# Patient Record
Sex: Male | Born: 1945 | Race: White | Hispanic: No | Marital: Married
Health system: Midwestern US, Community
[De-identification: ages and names within clinical notes are randomized; demographics above are authoritative.]

## PROBLEM LIST (undated history)

## (undated) DIAGNOSIS — K750 Abscess of liver: Secondary | ICD-10-CM

## (undated) DIAGNOSIS — R011 Cardiac murmur, unspecified: Secondary | ICD-10-CM

## (undated) DIAGNOSIS — N200 Calculus of kidney: Secondary | ICD-10-CM

## (undated) DIAGNOSIS — I10 Essential (primary) hypertension: Secondary | ICD-10-CM

## (undated) DIAGNOSIS — E785 Hyperlipidemia, unspecified: Secondary | ICD-10-CM

## (undated) HISTORY — DX: Essential (primary) hypertension: I10

## (undated) HISTORY — PX: TONSILLECTOMY: SUR1361

---

## 2005-11-05 ENCOUNTER — Emergency Department (HOSPITAL_COMMUNITY): Admission: EM | Admit: 2005-11-05 | Discharge: 2005-11-05 | Payer: Self-pay | Admitting: Emergency Medicine

## 2005-11-09 ENCOUNTER — Inpatient Hospital Stay (HOSPITAL_COMMUNITY): Admission: EM | Admit: 2005-11-09 | Discharge: 2005-11-15 | Payer: Self-pay | Admitting: Emergency Medicine

## 2005-11-23 ENCOUNTER — Encounter: Admission: RE | Admit: 2005-11-23 | Discharge: 2006-02-21 | Payer: Self-pay | Admitting: General Surgery

## 2007-03-16 IMAGING — CT CT PELVIS W/ CM
2 of 6 series · 17 of 46 positions shown, 19 images · IV contrast (omnipaque)
Comparison: None

ABDOMEN CT WITH CONTRAST

CLINICAL DATA: Abdominal pain, left groin pain, possible incarcerated hernia
TECHNIQUE: Multidetector CT imaging of the abdomen and pelvis was performed
following the standard protocol during bolus administration of intravenous
contrast.

Contrast:  125 cc Omnipaque 300

[Series 2: abd_pel 5.0 b40f st · axial · 0.82mm/px · z∈[-492,-67]mm · 14 of 99 slices shown, 16 images]
[im 7/99  soft-tissue]
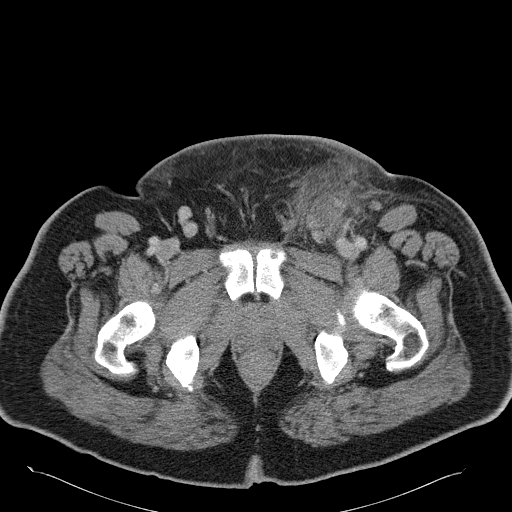
[im 7/99  bone]
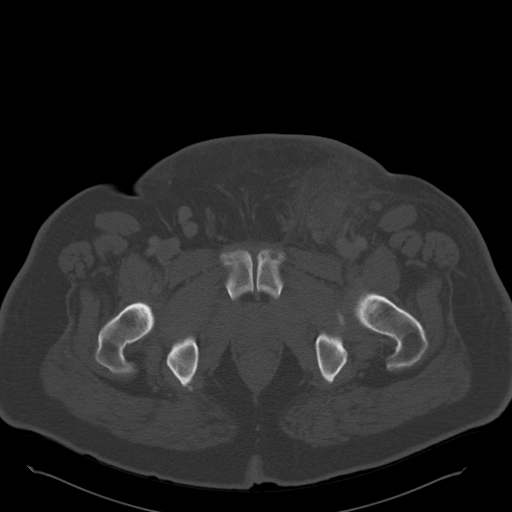
[im 14/99  soft-tissue]
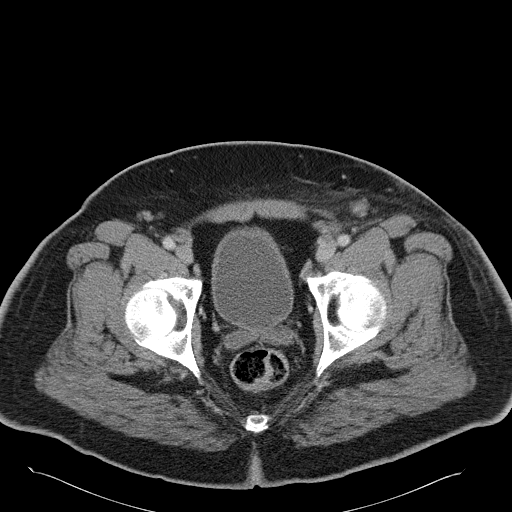
[im 20/99  soft-tissue]
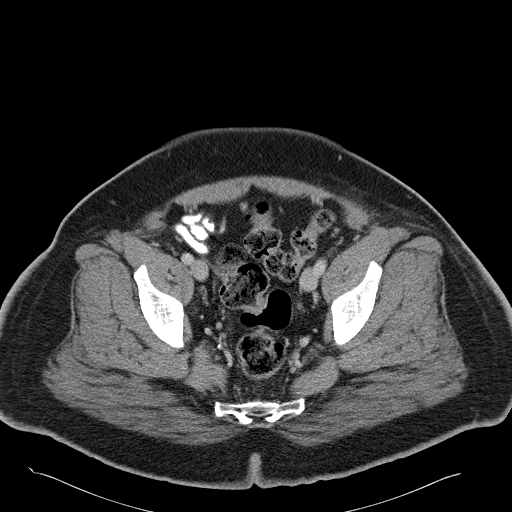
[im 27/99  soft-tissue]
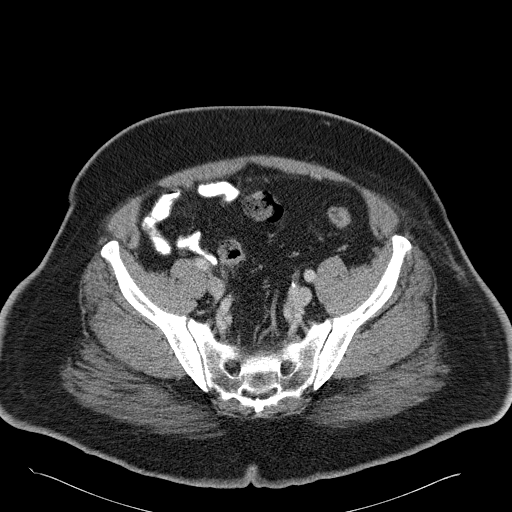
[im 33/99  soft-tissue]
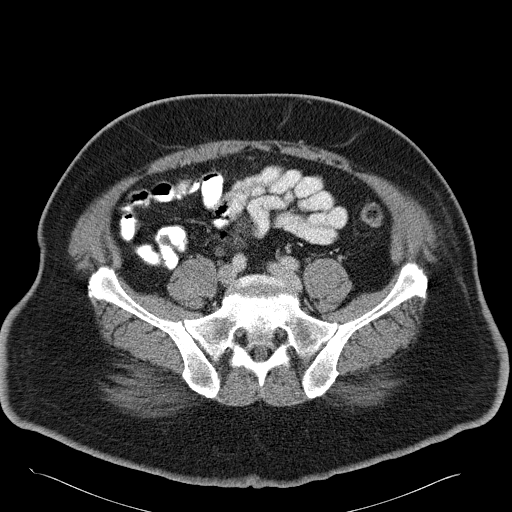
[im 40/99  soft-tissue]
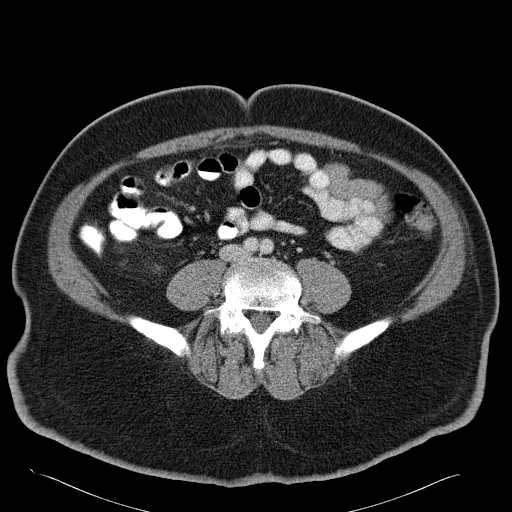
[im 46/99  soft-tissue]
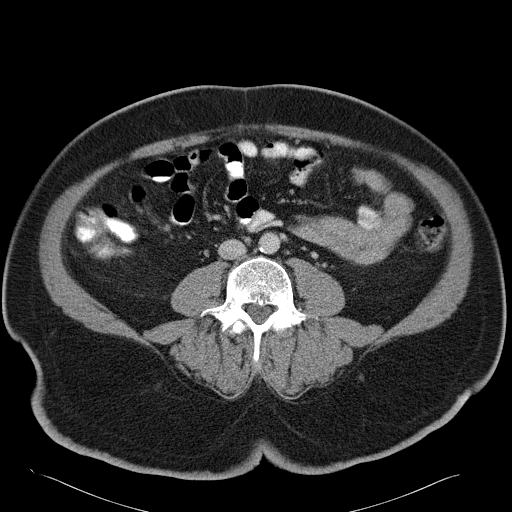
[im 53/99  soft-tissue]
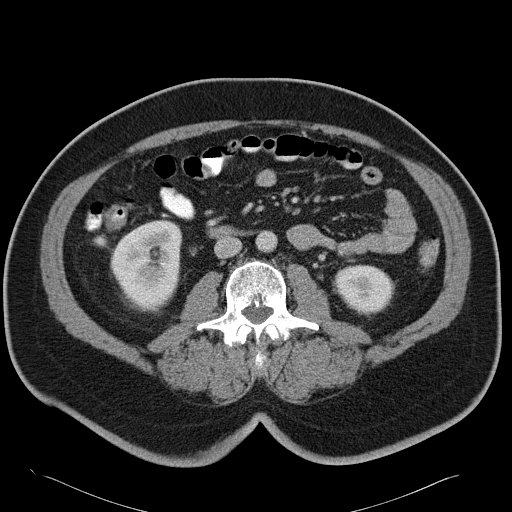
[im 59/99  soft-tissue]
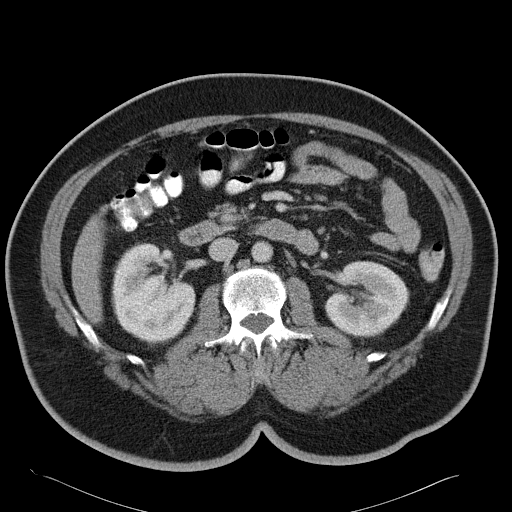
[im 59/99  bone]
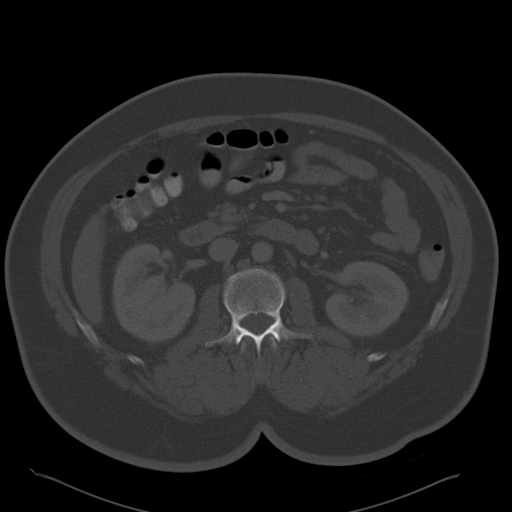
[im 66/99  soft-tissue]
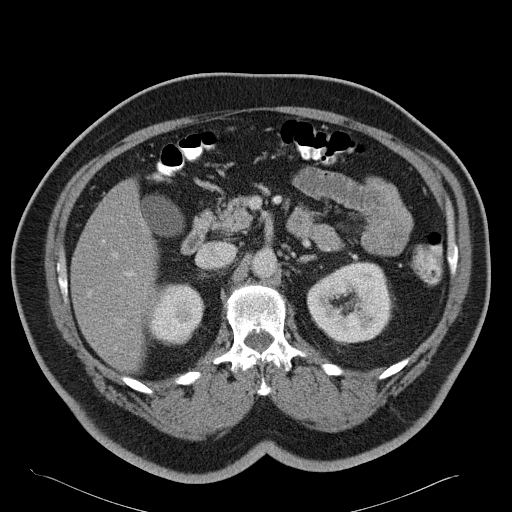
[im 72/99  soft-tissue]
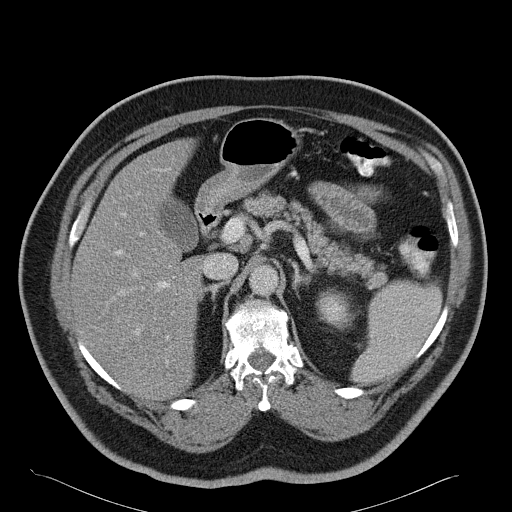
[im 79/99  soft-tissue]
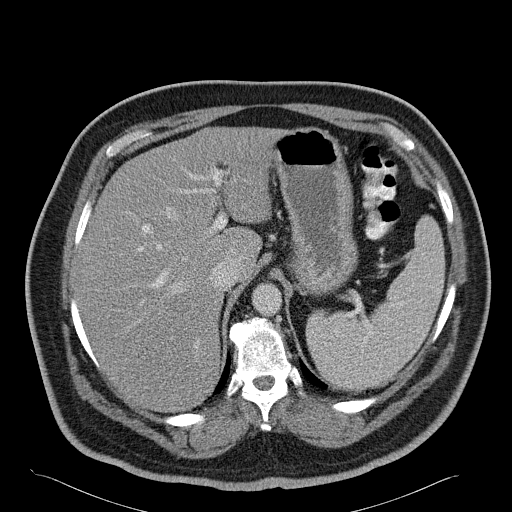
[im 85/99  soft-tissue]
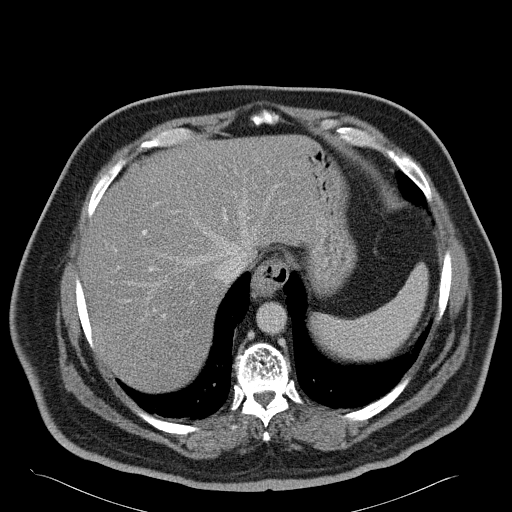
[im 92/99  soft-tissue]
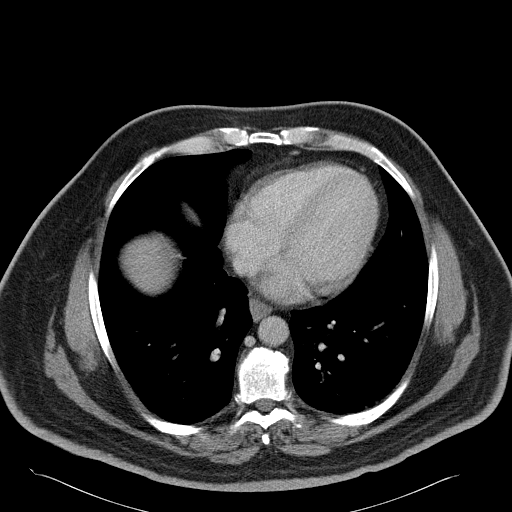

[Series 602: coronal · coronal · 1.00mm/px · 3 of 59 slices shown]
[im 20/59  soft-tissue]
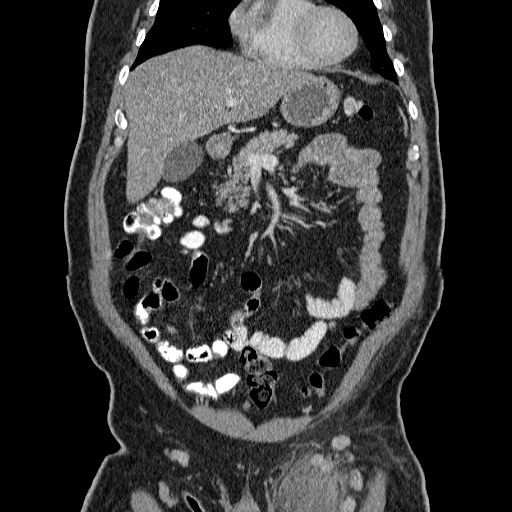
[im 26/59  soft-tissue]
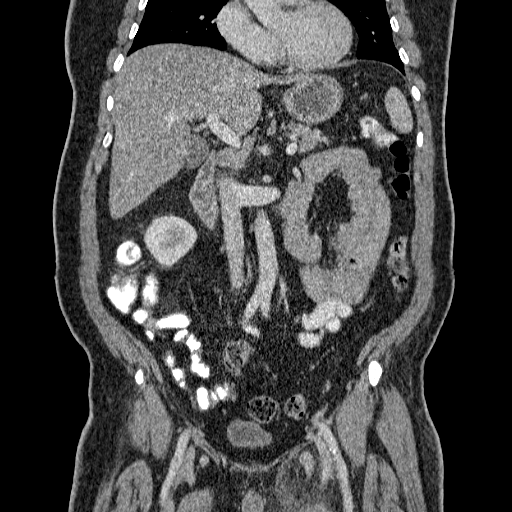
[im 33/59  soft-tissue]
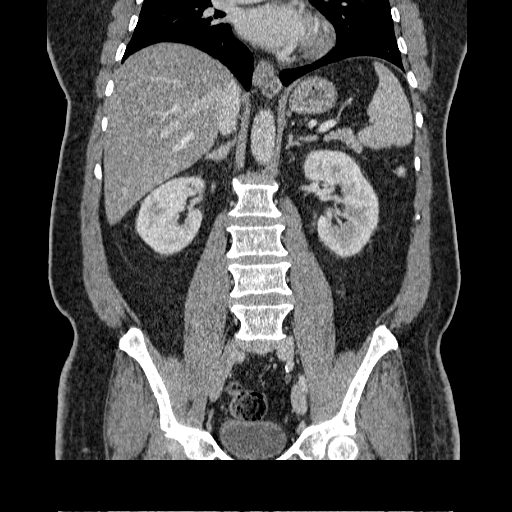

[17 of 46 positions shown; findings below may reference images not displayed]

FINDINGS: Fatty infiltration of the liver. No focal lesions in the liver,
spleen, pancreas, or adrenals, or right kidney. Simple appearing cyst in the
midpole of the left kidney. Gallbladder and bowel grossly unremarkable. No free
fluid, free air, or adenopathy. Degenerative changes in the thoracolumbar spine.
Minimal dependent and bibasilar atelectasis.

IMPRESSION

Fatty infiltration of the liver.

PELVIS CT WITH CONTRAST
FINDINGS: Inflammatory process in the left groin region. No focal fluid
collection to suggest abscess. Mildly enlarged bilateral inguinal nodes, left
greater than right.

No free fluid or free air in the abdomen. Bowel grossly unremarkable. No
evidence of hernia.

IMPRESSION

Inflammatory process in the left groin, possibly representing cellulitis. This
does extend down to near the musculature. Mildly enlarged inguinal lymph nodes
bilaterally, left greater than right. No focal fluid collection to suggest
abscess at this time.

## 2011-05-12 ENCOUNTER — Emergency Department (HOSPITAL_COMMUNITY)
Admission: EM | Admit: 2011-05-12 | Discharge: 2011-05-13 | Disposition: A | Discharge status: Home / Self Care | Payer: Self-pay | Attending: Emergency Medicine | Admitting: Emergency Medicine

## 2011-05-12 ENCOUNTER — Emergency Department (HOSPITAL_COMMUNITY): Payer: Self-pay

## 2011-05-12 DIAGNOSIS — I498 Other specified cardiac arrhythmias: Secondary | ICD-10-CM | POA: Insufficient documentation

## 2011-05-12 DIAGNOSIS — R569 Unspecified convulsions: Secondary | ICD-10-CM | POA: Insufficient documentation

## 2011-05-12 DIAGNOSIS — E119 Type 2 diabetes mellitus without complications: Secondary | ICD-10-CM | POA: Insufficient documentation

## 2011-05-13 ENCOUNTER — Emergency Department (HOSPITAL_COMMUNITY): Payer: Self-pay

## 2011-05-13 ENCOUNTER — Encounter (HOSPITAL_COMMUNITY): Payer: Self-pay | Admitting: Radiology

## 2011-05-13 LAB — DIFFERENTIAL
Basophils Absolute: 0 10*3/uL (ref 0.0–0.1)
Basophils Relative: 0 % (ref 0–1)
Eosinophils Absolute: 0.1 10*3/uL (ref 0.0–0.7)
Eosinophils Relative: 1 % (ref 0–5)
Lymphocytes Relative: 18 % (ref 12–46)
Lymphs Abs: 2.1 10*3/uL (ref 0.7–4.0)
Monocytes Absolute: 1.3 10*3/uL — ABNORMAL HIGH (ref 0.1–1.0)
Monocytes Relative: 11 % (ref 3–12)

## 2011-05-13 LAB — URINALYSIS, ROUTINE W REFLEX MICROSCOPIC
Glucose, UA: >1000 mg/dL — AB
Leukocytes, UA: NEGATIVE
pH: 5 (ref 5.0–8.0)

## 2011-05-13 LAB — COMPREHENSIVE METABOLIC PANEL
AST: 28 U/L (ref 0–37)
Albumin: 3.6 g/dL (ref 3.5–5.2)
BUN: 21 mg/dL (ref 6–23)
Calcium: 9.3 mg/dL (ref 8.4–10.5)
Creatinine, Ser: 0.99 mg/dL (ref 0.50–1.35)
GFR calc Af Amer: >60 mL/min (ref >60)

## 2011-05-13 LAB — RAPID URINE DRUG SCREEN, HOSP PERFORMED
Barbiturates: NOT DETECTED
Benzodiazepines: NOT DETECTED
Cocaine: NOT DETECTED
Opiates: NOT DETECTED

## 2011-05-13 LAB — CBC
MCH: 29.4 pg (ref 26.0–34.0)
MCV: 82.7 fL (ref 78.0–100.0)
RDW: 12.4 % (ref 11.5–15.5)

## 2011-05-13 LAB — GLUCOSE, CAPILLARY
Glucose-Capillary: 403 mg/dL — ABNORMAL HIGH (ref 70–99)
Glucose-Capillary: 408 mg/dL — ABNORMAL HIGH (ref 70–99)

## 2011-05-13 LAB — URINE MICROSCOPIC-ADD ON

## 2011-05-13 LAB — CK TOTAL AND CKMB (NOT AT ARMC)
CK, MB: 3.2 ng/mL (ref 0.3–4.0)
Relative Index: 1.6 (ref 0.0–2.5)

## 2011-05-15 ENCOUNTER — Ambulatory Visit (HOSPITAL_COMMUNITY)
Admission: RE | Admit: 2011-05-15 | Discharge: 2011-05-15 | Disposition: A | Discharge status: Home / Self Care | Payer: Self-pay | Source: Ambulatory Visit | Attending: Emergency Medicine | Admitting: Emergency Medicine

## 2011-05-15 DIAGNOSIS — E119 Type 2 diabetes mellitus without complications: Secondary | ICD-10-CM | POA: Insufficient documentation

## 2011-05-15 DIAGNOSIS — Z1389 Encounter for screening for other disorder: Secondary | ICD-10-CM | POA: Insufficient documentation

## 2011-05-15 DIAGNOSIS — R569 Unspecified convulsions: Secondary | ICD-10-CM | POA: Insufficient documentation

## 2011-05-16 NOTE — Procedures (Unsigned)
Referred by Dr. Lynelle Doctor  HISTORY:  A 65 year old man with new-onset seizure while in sleep with hyperglycemia of blood glucose 483.  EEG for further evaluation.  MEDICATIONS:  Metformin.  EEG DURATION:  21.5 minutes of EEG recording time.  EEG DESCRIPTION:  This is a routine 18 channel adult EEG recording with one channel devoted to limited EKG recording.  Activation procedure is performed during the photic stimulation and hyperventilation.  The study is performed in awake and mild drowsy state.  As the EEG opens up, I noticed that the posterior dominant rhythm consist of 9 Hz frequency with an amplitude ranging up to 22 microvolts. The rhythm is symmetrical and continuous.  There is no electrographic seizures or epileptiform discharges recorded during the study.  Mild driving was noted with the photic stimulation in posterior lead and buildup of high amplitude slowing was recorded with the hyperventilation without any provocation for epileptiform discharges.  I do observe some vertex waves and synchronous spindles as the patient got drowsy. However, no deeper stages of sleep were identified.  EEG INTERPRETATION:  This is a normal awake and drowsy EEG.  There is no evidence to suggest electrographic seizures or epileptiform discharges based on the study.          ______________________________ Levie Heritage, MD    ZO:[id number] D:  05/15/2011 16:23:33  T:  05/16/2011 03:19:03  Job #:  [id number]

## 2012-09-20 IMAGING — CR DG CHEST 1V
1 series · 1 of 1 positions shown · non-contrast
Comparison: 11/10/2005

CLINICAL DATA: Seizure history.

CHEST - 1 VIEW

[x chest ap]
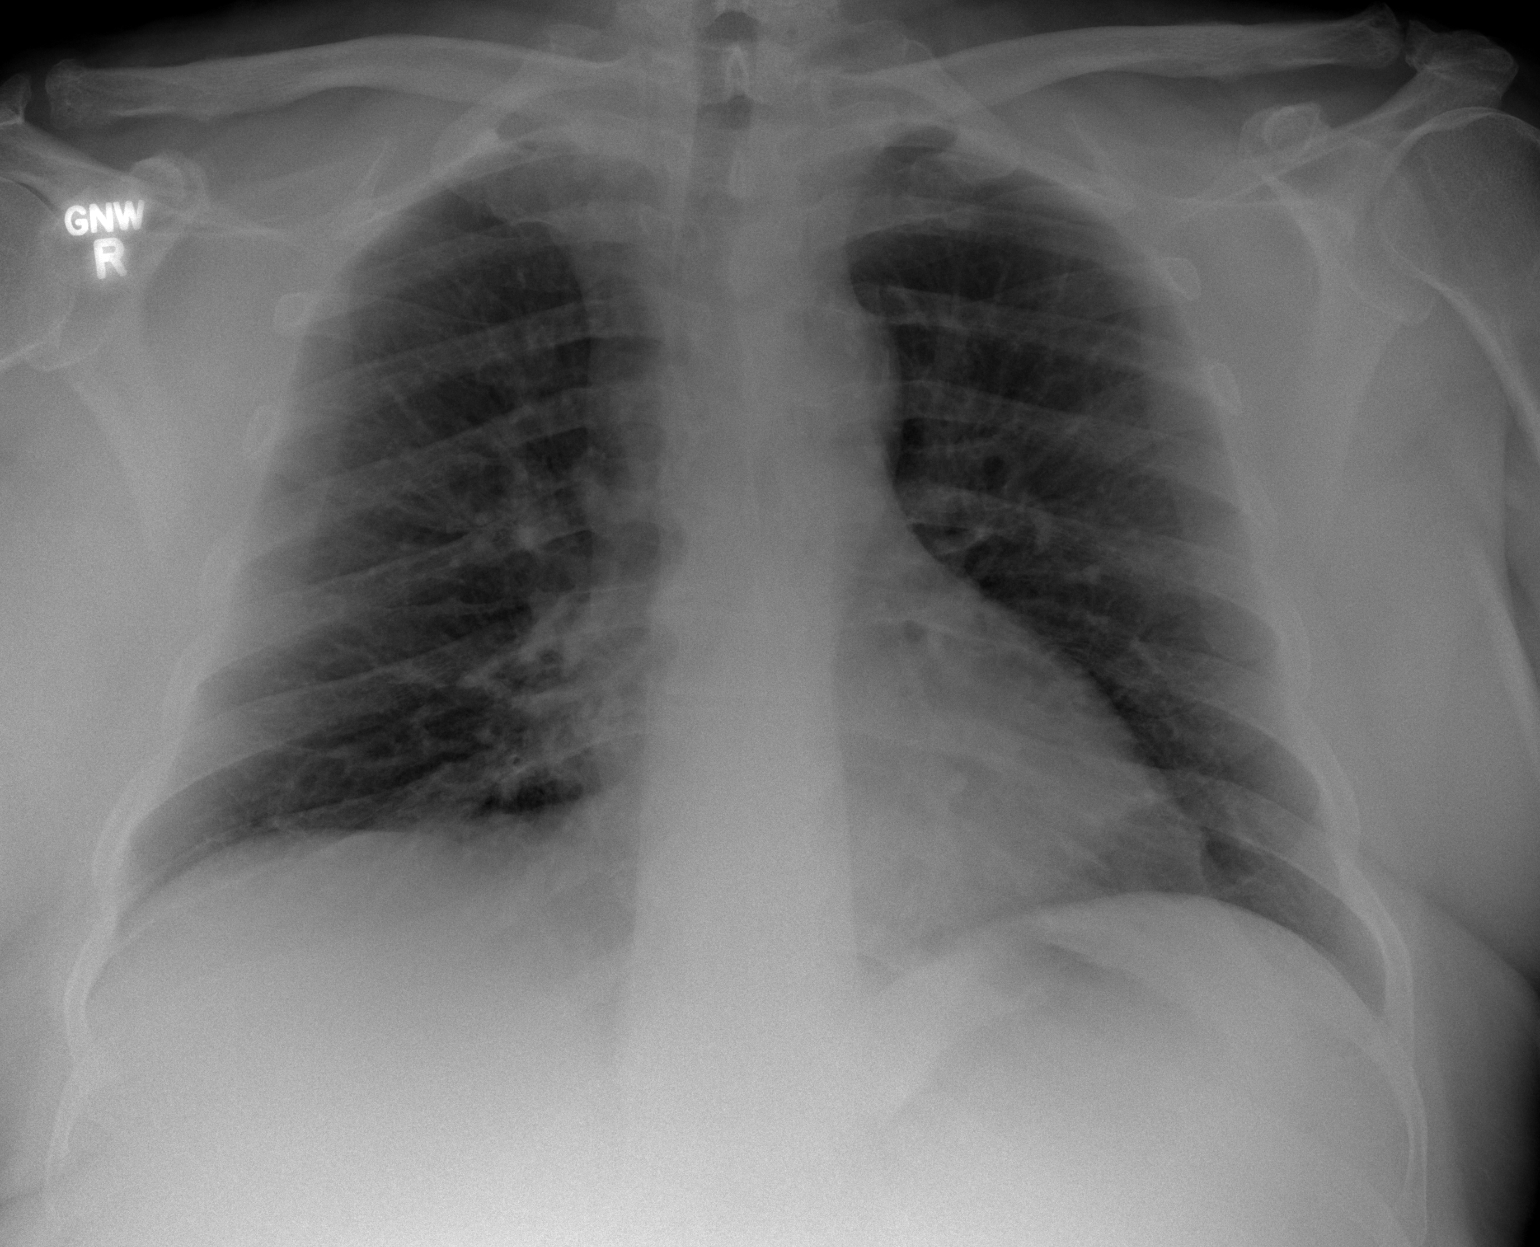

[1 of 1 positions shown; findings below may reference images not displayed]

FINDINGS: Apical lordotic projection.  Right basilar scarring and
bronchitic change.  No airspace consolidation.  No effusion.
Cardiopericardial silhouette and mediastinal contours appear within
normal limits allowing for projection.  Trachea midline.
IMPRESSION: Chronic right basilar scarring and bronchitic change.  No acute
cardiopulmonary disease.

## 2013-03-29 ENCOUNTER — Encounter: Payer: Self-pay | Admitting: Family Medicine

## 2013-03-29 ENCOUNTER — Ambulatory Visit (INDEPENDENT_AMBULATORY_CARE_PROVIDER_SITE_OTHER): Payer: Self-pay | Admitting: Family Medicine

## 2013-03-29 VITALS — BP 192/120 | HR 83 | Temp 97.5°F | Resp 16 | Ht 67.5 in | Wt 246.6 lb

## 2013-03-29 DIAGNOSIS — E1129 Type 2 diabetes mellitus with other diabetic kidney complication: Secondary | ICD-10-CM | POA: Insufficient documentation

## 2013-03-29 DIAGNOSIS — H5501 Congenital nystagmus: Secondary | ICD-10-CM | POA: Insufficient documentation

## 2013-03-29 DIAGNOSIS — E785 Hyperlipidemia, unspecified: Secondary | ICD-10-CM

## 2013-03-29 DIAGNOSIS — I10 Essential (primary) hypertension: Secondary | ICD-10-CM | POA: Insufficient documentation

## 2013-03-29 DIAGNOSIS — E7800 Pure hypercholesterolemia, unspecified: Secondary | ICD-10-CM | POA: Insufficient documentation

## 2013-03-29 DIAGNOSIS — E119 Type 2 diabetes mellitus without complications: Secondary | ICD-10-CM

## 2013-03-29 LAB — COMPREHENSIVE METABOLIC PANEL
ALT: 21 U/L (ref 0–53)
AST: 25 U/L (ref 0–37)
Albumin: 4.5 g/dL (ref 3.5–5.2)
Alkaline Phosphatase: 47 U/L (ref 39–117)
BUN: 22 mg/dL (ref 6–23)
CO2: 21 mEq/L (ref 19–32)
Calcium: 9.5 mg/dL (ref 8.4–10.5)
Chloride: 102 mEq/L (ref 96–112)
Creat: 1.18 mg/dL (ref 0.50–1.35)
Glucose, Bld: 299 mg/dL — ABNORMAL HIGH (ref 70–99)
Potassium: 4.2 mEq/L (ref 3.5–5.3)
Sodium: 138 mEq/L (ref 135–145)
Total Bilirubin: 1 mg/dL (ref 0.3–1.2)
Total Protein: 8 g/dL (ref 6.0–8.3)

## 2013-03-29 LAB — LIPID PANEL
Cholesterol: 206 mg/dL — ABNORMAL HIGH (ref 0–200)
HDL: 33 mg/dL — ABNORMAL LOW (ref >39)
Total CHOL/HDL Ratio: 6.2 Ratio
Triglycerides: 585 mg/dL — ABNORMAL HIGH (ref <150)

## 2013-03-29 LAB — CBC WITH DIFFERENTIAL/PLATELET
Basophils Absolute: 0 10*3/uL (ref 0.0–0.1)
Basophils Relative: 1 % (ref 0–1)
Eosinophils Absolute: 0.2 10*3/uL (ref 0.0–0.7)
Eosinophils Relative: 3 % (ref 0–5)
HCT: 41.6 % (ref 39.0–52.0)
Hemoglobin: 14.2 g/dL (ref 13.0–17.0)
Lymphocytes Relative: 38 % (ref 12–46)
Lymphs Abs: 2.2 10*3/uL (ref 0.7–4.0)
MCH: 28.9 pg (ref 26.0–34.0)
MCHC: 34.1 g/dL (ref 30.0–36.0)
MCV: 84.7 fL (ref 78.0–100.0)
Monocytes Absolute: 0.6 10*3/uL (ref 0.1–1.0)
Monocytes Relative: 10 % (ref 3–12)
Neutro Abs: 2.8 10*3/uL (ref 1.7–7.7)
Neutrophils Relative %: 48 % (ref 43–77)
Platelets: 116 10*3/uL — ABNORMAL LOW (ref 150–400)
RBC: 4.91 MIL/uL (ref 4.22–5.81)
RDW: 14 % (ref 11.5–15.5)
WBC: 5.8 10*3/uL (ref 4.0–10.5)

## 2013-03-29 LAB — PSA: PSA: 0.62 ng/mL (ref <=4.00)

## 2013-03-29 LAB — POCT GLYCOSYLATED HEMOGLOBIN (HGB A1C): Hemoglobin A1C: 13.4

## 2013-03-29 MED ORDER — HYDROCHLOROTHIAZIDE 25 MG PO TABS: 12.5000 mg | ORAL_TABLET | Freq: Every day | ORAL | Status: DC

## 2013-03-29 MED ORDER — SIMVASTATIN 40 MG PO TABS: 40.0000 mg | ORAL_TABLET | Freq: Every evening | ORAL | Status: DC

## 2013-03-29 MED ORDER — METOPROLOL SUCCINATE ER 100 MG PO TB24: 100.0000 mg | ORAL_TABLET | Freq: Every day | ORAL | Status: DC

## 2013-03-29 MED ORDER — GLYBURIDE 2.5 MG PO TABS: 2.5000 mg | ORAL_TABLET | Freq: Two times a day (BID) | ORAL | Status: DC

## 2013-03-29 MED ORDER — LOSARTAN POTASSIUM 100 MG PO TABS: 100.0000 mg | ORAL_TABLET | Freq: Every day | ORAL | Status: DC

## 2013-03-29 NOTE — Progress Notes (Signed)
67 yo veteran, married to [employer], who has been treated at the [place] until now.  He has had hypertension, cholesterol, and diabetes problems 6-7 years ago.  [facility] for eye checks  [facility]  Zostavax:  Never  Pneumonia shot: last October  Tetanus shot:  Last October  Colonoscopy:  Last year, polyps noted.  Recheck 3 years  Last PSA:  Last year  Checks sugar at home:  95-125 Checks blood pressure at home:  120/80 to 150/80  Off meds for two weeks. 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  Assessment:  Hypertension, diabetes, hyperlipidemia  Plan:   Hypertension - Plan: metoprolol succinate (TOPROL-XL) 100 MG 24 hr tablet, losartan (COZAAR) 100 MG tablet, hydrochlorothiazide (HYDRODIURIL) 25 MG tablet, CBC with Differential, Comprehensive metabolic panel, PSA  Hyperlipidemia - Plan: simvastatin (ZOCOR) 40 MG tablet, CBC with Differential, Lipid panel  Type 2 diabetes mellitus - Plan: glyBURIDE (DIABETA) 2.5 MG tablet, Comprehensive metabolic panel, POCT glycosylated hemoglobin (Hb A1C)  Inari Shin

## 2013-03-29 NOTE — Patient Instructions (Signed)
Recheck in 4 months

## 2013-04-04 ENCOUNTER — Telehealth: Payer: Self-pay

## 2013-04-04 DIAGNOSIS — E785 Hyperlipidemia, unspecified: Secondary | ICD-10-CM

## 2013-04-04 NOTE — Telephone Encounter (Signed)
Pt states that [facility] informed him that we need to call them regarding his prescriptions (pt does not know which ones). Pt also does not know a number we can reach them. Best# [phone]

## 2013-04-04 NOTE — Telephone Encounter (Signed)
Called him, what meds is he requesting?

## 2013-04-04 NOTE — Telephone Encounter (Signed)
Patient states he got message, one of the meds needs clarification, states we will get information from [facility].

## 2013-04-05 MED ORDER — SIMVASTATIN 40 MG PO TABS: 20.0000 mg | ORAL_TABLET | Freq: Every evening | ORAL | Status: DC

## 2013-04-05 NOTE — Addendum Note (Signed)
Addended by: Sheppard Plumber A on: 04/05/2013 01:07 PM   Modules accepted: Orders

## 2013-04-05 NOTE — Telephone Encounter (Signed)
Received fax for clarification of sig for simvastatin, 1/2 or 1 tab QD? Contacted pt to clarify what he has been taking since Dr L, did not intend to change the dosage. Pt clarified that he has been taking 1/2 tab QD. Sending in new corrected Rx.

## 2013-04-07 ENCOUNTER — Telehealth: Payer: Self-pay

## 2013-04-07 NOTE — Telephone Encounter (Signed)
Pt would like to know lab results. Best# [phone]

## 2013-04-07 NOTE — Telephone Encounter (Signed)
Went over results with patient and informed of results. He understood and will be back in 3-4 months.

## 2013-04-11 ENCOUNTER — Telehealth: Payer: Self-pay

## 2013-04-11 NOTE — Telephone Encounter (Signed)
Pt came in to see if Glyburde 2.5 mg and Metformintcl 1000 mg was the same thing he also stated that the Glyburde wasn't covered by the insurance  [phone]

## 2013-04-12 ENCOUNTER — Telehealth: Payer: Self-pay | Admitting: Family Medicine

## 2013-04-12 ENCOUNTER — Telehealth: Payer: Self-pay

## 2013-04-12 NOTE — Telephone Encounter (Signed)
I received approval for PA on Glyburide from [facility] through 04/12/14. I faxed approval to [facility] and LMOM for pt to notify him.

## 2013-04-12 NOTE — Telephone Encounter (Signed)
I need to discuss meds with patient.  It would be best if he came in for this conversation.  I called and left a message, but the last A1C was so high that we really need to get on top of the diabetes and continue to monitor it closely.  I left a message for patient to call back or come in for OV, but on further thinking, OV is much preferable.

## 2013-04-12 NOTE — Telephone Encounter (Signed)
PT JUST WANTED [name] TO KNOW HE GOT APPROVED FOR HIS MEDICINE AND EVERYTHING IS Timothy Martin MAY REACH HIM AT [phone] IF NEEDED

## 2013-04-12 NOTE — Telephone Encounter (Signed)
Spoke with pt, advised Rx was approved. Pt understood.

## 2013-04-12 NOTE — Telephone Encounter (Signed)
Can we possibly change to something else

## 2013-04-13 NOTE — Telephone Encounter (Signed)
Called pt and advised him that even though his glyburide was approved, Dr L would like to see him soon for f/up on DM since his A1C was so high. Pt agreed and will try to come in this Sat.

## 2013-04-15 ENCOUNTER — Ambulatory Visit (INDEPENDENT_AMBULATORY_CARE_PROVIDER_SITE_OTHER): Payer: Medicare PPO | Admitting: Family Medicine

## 2013-04-15 VITALS — BP 170/84 | HR 66 | Temp 97.7°F | Resp 18 | Ht 68.0 in | Wt 243.0 lb

## 2013-04-15 DIAGNOSIS — E119 Type 2 diabetes mellitus without complications: Secondary | ICD-10-CM

## 2013-04-15 DIAGNOSIS — E1059 Type 1 diabetes mellitus with other circulatory complications: Secondary | ICD-10-CM

## 2013-04-15 LAB — GLUCOSE, POCT (MANUAL RESULT ENTRY): POC Glucose: 266 mg/dl — AB (ref 70–99)

## 2013-04-15 MED ORDER — GLYBURIDE 5 MG PO TABS: 5.0000 mg | ORAL_TABLET | Freq: Every day | ORAL | Status: DC

## 2013-04-15 NOTE — Progress Notes (Signed)
67 yo diabetic man with elevated blood sugar 2 weeks ago.   67 yo veteran, married to [employer], who has been treated at the [place] until now. He has had hypertension, cholesterol, and diabetes problems 6-7 years ago.     The A1C was 13.4  Objective:  NAD Results for orders placed in visit on 04/15/13  GLUCOSE, POCT (MANUAL RESULT ENTRY)      Result Value Range   POC Glucose 266 (*) 70 - 99 mg/dl   Assessment:  Improvement in sugar, still elevated.  Plan:  Glyburide 5 mg daily. Recheck 3 months.

## 2013-05-18 ENCOUNTER — Other Ambulatory Visit: Payer: Self-pay

## 2013-05-18 MED ORDER — ALCOHOL SWABS PADS: MEDICATED_PAD | Status: DC

## 2013-05-18 MED ORDER — LANCETS MISC: Status: DC

## 2013-05-18 MED ORDER — GLUCOSE BLOOD VI STRP: ORAL_STRIP | Status: DC

## 2013-05-18 MED ORDER — BLOOD GLUCOSE METER KIT: PACK | Status: AC

## 2013-05-18 MED ORDER — UNABLE TO FIND: Status: DC

## 2013-05-23 ENCOUNTER — Telehealth: Payer: Self-pay

## 2013-05-23 DIAGNOSIS — I10 Essential (primary) hypertension: Secondary | ICD-10-CM

## 2013-05-23 MED ORDER — METOPROLOL SUCCINATE ER 100 MG PO TB24: 100.0000 mg | ORAL_TABLET | Freq: Every day | ORAL | Status: DC

## 2013-05-23 MED ORDER — HYDROCHLOROTHIAZIDE 12.5 MG PO TABS: 12.5000 mg | ORAL_TABLET | Freq: Every day | ORAL | Status: DC

## 2013-05-23 NOTE — Addendum Note (Signed)
Addended by: Sheppard Plumber A on: 05/23/2013 03:35 PM   Modules accepted: Orders

## 2013-05-23 NOTE — Telephone Encounter (Signed)
Pt came in and stated that [facility] has the incorrect dosages on his metoprolol ER and HCTZ and showed me the invoices that show the Rx they have is for metoprolol ER 100 mg TAKEN QD and pt states he has been taking this BID. The HCTZ 25 states  That he should take .5 tab QD and pt reports that he is supposed to take 1 whole tab. The last shipments of these were for #90 and #45, which only lasted pt 1 1/2 mos.  Dr L, I see on your plan from 03/29/13 OV that you have HCTZ 25 listed w/no mention of him just taking 1/2 tab, so I am assuming that is correct and will pend the Rx that way, but metoprolol ER is not usually dosed BID and wanted to check w/you to see if pt was maybe switched from reg to ER and didn't know he only had to take QD, or if you intended for him to be on the reg version taking BID. Pt states his BP has been running very good on the doses he has been taking. (in 120/80 range). Please advise.

## 2013-05-23 NOTE — Telephone Encounter (Signed)
Spoke w/pt and gave him Dr Loma Boston directions for taking these two meds. Pt verbalized understanding.  I have sent in new Rx for the 12.5 HCTZ and Metoprolol ER QD.

## 2013-05-23 NOTE — Telephone Encounter (Signed)
12.5 mg or HCTZ works just as well as 25 mg with fewer potential side effects.  I felt that as Timothy Martin ages, he should reduce the metoprolol to reduce chance of dizziness as well.  Please let him know this.

## 2013-07-27 ENCOUNTER — Ambulatory Visit: Payer: Medicare PPO | Admitting: Family Medicine

## 2013-08-03 ENCOUNTER — Ambulatory Visit (INDEPENDENT_AMBULATORY_CARE_PROVIDER_SITE_OTHER): Payer: Medicare PPO | Admitting: Family Medicine

## 2013-08-03 ENCOUNTER — Encounter: Payer: Self-pay | Admitting: Family Medicine

## 2013-08-03 VITALS — BP 140/80 | HR 68 | Temp 97.7°F | Resp 16 | Ht 68.0 in | Wt 245.0 lb

## 2013-08-03 DIAGNOSIS — Z23 Encounter for immunization: Secondary | ICD-10-CM

## 2013-08-03 DIAGNOSIS — E669 Obesity, unspecified: Secondary | ICD-10-CM | POA: Insufficient documentation

## 2013-08-03 DIAGNOSIS — E785 Hyperlipidemia, unspecified: Secondary | ICD-10-CM

## 2013-08-03 DIAGNOSIS — I10 Essential (primary) hypertension: Secondary | ICD-10-CM

## 2013-08-03 DIAGNOSIS — E119 Type 2 diabetes mellitus without complications: Secondary | ICD-10-CM

## 2013-08-03 LAB — COMPREHENSIVE METABOLIC PANEL
ALT: 17 U/L (ref 0–53)
AST: 21 U/L (ref 0–37)
Albumin: 4.7 g/dL (ref 3.5–5.2)
Alkaline Phosphatase: 42 U/L (ref 39–117)
BUN: 30 mg/dL — ABNORMAL HIGH (ref 6–23)
CO2: 26 mEq/L (ref 19–32)
Calcium: 10 mg/dL (ref 8.4–10.5)
Chloride: 107 mEq/L (ref 96–112)
Creat: 1.39 mg/dL — ABNORMAL HIGH (ref 0.50–1.35)
Glucose, Bld: 138 mg/dL — ABNORMAL HIGH (ref 70–99)
Potassium: 4.1 mEq/L (ref 3.5–5.3)
Sodium: 141 mEq/L (ref 135–145)
Total Bilirubin: 0.9 mg/dL (ref 0.3–1.2)
Total Protein: 7.9 g/dL (ref 6.0–8.3)

## 2013-08-03 LAB — CBC WITH DIFFERENTIAL/PLATELET
Basophils Absolute: 0 10*3/uL (ref 0.0–0.1)
Basophils Relative: 0 % (ref 0–1)
Eosinophils Absolute: 0.2 10*3/uL (ref 0.0–0.7)
Eosinophils Relative: 3 % (ref 0–5)
HCT: 40.7 % (ref 39.0–52.0)
Hemoglobin: 13.9 g/dL (ref 13.0–17.0)
Lymphocytes Relative: 39 % (ref 12–46)
Lymphs Abs: 3 10*3/uL (ref 0.7–4.0)
MCH: 29.3 pg (ref 26.0–34.0)
MCHC: 34.2 g/dL (ref 30.0–36.0)
MCV: 85.9 fL (ref 78.0–100.0)
Monocytes Absolute: 1 10*3/uL (ref 0.1–1.0)
Monocytes Relative: 13 % — ABNORMAL HIGH (ref 3–12)
Neutro Abs: 3.6 10*3/uL (ref 1.7–7.7)
Neutrophils Relative %: 45 % (ref 43–77)
Platelets: 133 10*3/uL — ABNORMAL LOW (ref 150–400)
RBC: 4.74 MIL/uL (ref 4.22–5.81)
RDW: 13.9 % (ref 11.5–15.5)
WBC: 7.8 10*3/uL (ref 4.0–10.5)

## 2013-08-03 LAB — LIPID PANEL
Cholesterol: 136 mg/dL (ref 0–200)
HDL: 31 mg/dL — ABNORMAL LOW (ref >39)
LDL Cholesterol: 55 mg/dL (ref 0–99)
Total CHOL/HDL Ratio: 4.4 Ratio
Triglycerides: 252 mg/dL — ABNORMAL HIGH (ref <150)
VLDL: 50 mg/dL — ABNORMAL HIGH (ref 0–40)

## 2013-08-03 LAB — POCT GLYCOSYLATED HEMOGLOBIN (HGB A1C): Hemoglobin A1C: 8

## 2013-08-03 NOTE — Patient Instructions (Addendum)
Please get dental evaluation to prevent entry of bacteria into blood stream Continue yearly eye exam to check for diabetic changes.  Great job going to [facility] 3 months or so.

## 2013-08-03 NOTE — Progress Notes (Signed)
Patient ID: Timothy Martin MRN: [id number], DOB: [birth year], 67 y.o. Date of Encounter: 08/03/2013, 8:11 AM  Primary Physician: Provider Not In System  Chief Complaint: Diabetes follow up  HPI: 67 y.o. year old male with history below presents for follow up of diabetes mellitus. Doing well. No issues or complaints. Taking medications daily without adverse effects. No polydipsia, polyphagia, polyuria, or nocturia.  Blood sugars at home:  117 this am Diet consists NW:GNFA as before Exercising regularly.at [employer] 5-6 days per week with wife:  Treadmill and bike Last A1C:  13.4    Eye MD: yearly DDS: no Influenza vaccine:  today Pneumococcal vaccine: last year Colonoscopy last year:  Several polyps with suggested recall 2 years from now  Past Medical History  Diagnosis Date  . Diabetes mellitus   . Hypertension      Home Meds: Prior to Admission medications   Medication Sig Start Date End Date Taking? Authorizing Provider  Alcohol Swabs PADS USE TO CHECK BLOOD SUGAR DAILY. DX CODE: 250.00 05/18/13  Yes Heather Jaquita Rector, PA-C  Blood Glucose Monitoring Suppl (BLOOD GLUCOSE METER) kit Use to test blood sugar daily. Dx code: 250.00 05/18/13  Yes Heather Jaquita Rector, PA-C  fish oil-omega-3 fatty acids 1000 MG capsule Take 1 g by mouth 2 (two) times daily after a meal.   Yes Historical Provider, MD  glucose blood test strip Use to check blood sugar daily. Dx code: 250.00 05/18/13  Yes Heather Jaquita Rector, PA-C  glyBURIDE (DIABETA) 5 MG tablet Take 1 tablet (5 mg total) by mouth daily with breakfast. 04/15/13  Yes Elvina Sidle, MD  hydrochlorothiazide (HYDRODIURIL) 12.5 MG tablet Take 1 tablet (12.5 mg total) by mouth daily. 05/23/13  Yes Elvina Sidle, MD  Lancets MISC Use to test blood sugar daily. Dx code:250.00 05/18/13  Yes Heather M [name], PA-C  losartan (COZAAR) 100 MG tablet Take 1 tablet (100 mg total) by mouth daily. NEED REFILLS 03/29/13  Yes Elvina Sidle, MD  metoprolol  succinate (TOPROL-XL) 100 MG 24 hr tablet Take 1 tablet (100 mg total) by mouth daily. Take with or immediately following a meal. 05/23/13  Yes Elvina Sidle, MD  simvastatin (ZOCOR) 40 MG tablet Take 0.5 tablets (20 mg total) by mouth every evening. 04/05/13  Yes Elvina Sidle, MD  UNABLE TO FIND Use to check blood sugar daily. Dx code: 250.00 05/18/13  Yes Nelva Nay, PA-C    Allergies:  Allergies  Allergen Reactions  . Codeine Other (See Comments)    CHILDHOOD  . Augmentin [Amoxicillin-Pot Clavulanate] Rash    History   Social History  . Marital Status: Married    Spouse Name: N/A    Number of Children: N/A  . Years of Education: N/A   Occupational History  . Not on file.   Social History Main Topics  . Smoking status: Never Smoker   . Smokeless tobacco: Not on file  . Alcohol Use: No  . Drug Use: No  . Sexual Activity: Not on file   Other Topics Concern  . Not on file   Social History Narrative  . No narrative on file     Review of Systems: Constitutional: negative for chills, fever, night sweats, weight changes, or fatigue  HEENT: negative for vision changes, hearing loss, congestion, rhinorrhea, or epistaxis Cardiovascular: negative for chest pain, palpitations, diaphoresis, DOE, orthopnea, or edema Respiratory: negative for hemoptysis, wheezing, shortness of breath, dyspnea, or cough Abdominal: negative for abdominal pain, nausea, vomiting, diarrhea, or constipation  Dermatological: negative for rash, erythema, or wounds Neurologic: negative for headache, dizziness, or syncope Renal:  Negative for polyuria, polydipsia, or dysuria All other systems reviewed and are otherwise negative with the exception to those above and in the HPI.   Physical Exam: Blood pressure 140/80, pulse 68, temperature 97.7 F (36.5 C), temperature source Oral, resp. rate 16, height 5\' 8"  (1.727 m), weight 245 lb (111.131 kg), SpO2 100.00%., Body mass index is 37.26  kg/(m^2). General: Well developed, well nourished, in no acute distress. Head: Normocephalic, atraumatic, eyes without discharge, sclera non-icteric, nares are without discharge. Bilateral auditory canals clear, TM's are without perforation, pearly grey and translucent with reflective cone of light bilaterally. Oral cavity moist, posterior pharynx without exudate, erythema, peritonsillar abscess, or post nasal drip. Carious teeth. 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 Neck: Supple. No thyromegaly. Full ROM. No lymphadenopathy.  No bruits Lungs: Clear bilaterally to auscultation without wheezes, rales, or rhonchi. Breathing is unlabored. Heart: RRR with S1 S2. Soft systolic murmur right sternal border.  No rubs, or gallops appreciated. Abdomen: Soft, non-tender, non-distended with normoactive bowel sounds. No hepatosplenomegaly. No rebound/guarding. No obvious abdominal masses. Msk:  Strength and tone normal for age. Extremities/Skin: Warm and dry. No clubbing or cyanosis. No edema. No rashes, wounds, or suspicious lesions. Monofilament exam . normal Neuro: Alert and oriented X 3. Moves all extremities spontaneously. Gait is normal. CNII-XII grossly in tact with exception of nystagmus Psych:  Responds to questions appropriately with a normal affect.   Labs:  hgb A1C 8.0   ASSESSMENT AND PLAN:  67 y.o. year old male with 7 years diabetes.  Excellent change in lifestyle with regular exercise.  I recommend that he get his dentist involved.  Recommend yearly dilated eye exam.  Follow up 3 months Need for prophylactic vaccination and inoculation against influenza - Plan: Flu Vaccine QUAD 36+ mos IM  Type II or unspecified type diabetes mellitus without mention of complication, not stated as uncontrolled - Plan: Microalbumin, urine, POCT glycosylated hemoglobin (Hb A1C)  Essential hypertension, benign - Plan: Comprehensive metabolic panel, CBC with Differential  Other and  unspecified hyperlipidemia - Plan: Lipid panel   -  Signed, Elvina Sidle, MD 08/03/2013 8:11 AM

## 2013-08-04 LAB — MICROALBUMIN, URINE: Microalb, Ur: 2.11 mg/dL — ABNORMAL HIGH (ref 0.00–1.89)

## 2013-10-06 ENCOUNTER — Telehealth: Payer: Self-pay

## 2013-10-06 NOTE — Telephone Encounter (Signed)
Spoke with patient and let him know that he received a 90 day supply of hctz in July with one refill.  He will have the pharmacy call in January for a refill.

## 2013-10-06 NOTE — Telephone Encounter (Signed)
Patient would like to know if he has any refill on his blood pressure medication and if he could possibly get a refill

## 2013-11-16 ENCOUNTER — Encounter: Payer: Self-pay | Admitting: Family Medicine

## 2013-11-16 ENCOUNTER — Ambulatory Visit (INDEPENDENT_AMBULATORY_CARE_PROVIDER_SITE_OTHER): Payer: Medicare PPO | Admitting: Family Medicine

## 2013-11-16 VITALS — BP 130/82 | HR 74 | Temp 98.0°F | Resp 16 | Ht 67.75 in | Wt 241.4 lb

## 2013-11-16 DIAGNOSIS — I10 Essential (primary) hypertension: Secondary | ICD-10-CM

## 2013-11-16 DIAGNOSIS — Z23 Encounter for immunization: Secondary | ICD-10-CM

## 2013-11-16 DIAGNOSIS — R209 Unspecified disturbances of skin sensation: Secondary | ICD-10-CM

## 2013-11-16 DIAGNOSIS — R202 Paresthesia of skin: Secondary | ICD-10-CM

## 2013-11-16 DIAGNOSIS — E119 Type 2 diabetes mellitus without complications: Secondary | ICD-10-CM

## 2013-11-16 DIAGNOSIS — E785 Hyperlipidemia, unspecified: Secondary | ICD-10-CM

## 2013-11-16 LAB — LIPID PANEL
Cholesterol: 138 mg/dL (ref 0–200)
HDL: 30 mg/dL — ABNORMAL LOW (ref >39)
LDL Cholesterol: 60 mg/dL (ref 0–99)
Total CHOL/HDL Ratio: 4.6 Ratio
Triglycerides: 239 mg/dL — ABNORMAL HIGH (ref <150)
VLDL: 48 mg/dL — ABNORMAL HIGH (ref 0–40)

## 2013-11-16 LAB — COMPREHENSIVE METABOLIC PANEL
ALT: 22 U/L (ref 0–53)
AST: 29 U/L (ref 0–37)
Albumin: 4.5 g/dL (ref 3.5–5.2)
Alkaline Phosphatase: 44 U/L (ref 39–117)
BUN: 32 mg/dL — ABNORMAL HIGH (ref 6–23)
CO2: 22 mEq/L (ref 19–32)
Calcium: 9.4 mg/dL (ref 8.4–10.5)
Chloride: 101 mEq/L (ref 96–112)
Creat: 1.43 mg/dL — ABNORMAL HIGH (ref 0.50–1.35)
Glucose, Bld: 112 mg/dL — ABNORMAL HIGH (ref 70–99)
Potassium: 4 mEq/L (ref 3.5–5.3)
Sodium: 136 mEq/L (ref 135–145)
Total Bilirubin: 0.9 mg/dL (ref 0.3–1.2)
Total Protein: 7.8 g/dL (ref 6.0–8.3)

## 2013-11-16 LAB — VITAMIN B12: Vitamin B-12: 440 pg/mL (ref 211–911)

## 2013-11-16 LAB — MICROALBUMIN, URINE: Microalb, Ur: 3.51 mg/dL — ABNORMAL HIGH (ref 0.00–1.89)

## 2013-11-16 LAB — POCT GLYCOSYLATED HEMOGLOBIN (HGB A1C): Hemoglobin A1C: 7.1

## 2013-11-16 MED ORDER — HYDROCHLOROTHIAZIDE 12.5 MG PO TABS: 12.5000 mg | ORAL_TABLET | Freq: Every day | ORAL | Status: DC

## 2013-11-16 MED ORDER — METOPROLOL SUCCINATE ER 100 MG PO TB24: 100.0000 mg | ORAL_TABLET | Freq: Every day | ORAL | Status: DC

## 2013-11-16 NOTE — Progress Notes (Signed)
68 yo retired man who works at [employer] [place].  Diabetes x 7 years.  Blood sugars 110-150, checked every day.

## 2013-11-16 NOTE — Progress Notes (Signed)
Patient ID: Timothy Martin MRN: [id number], DOB: 08/22/1946, 68 y.o. Date of Encounter: 11/16/2013, 10:26 AM  Primary Physician: Provider Not In System  Chief Complaint: Diabetes follow up  HPI: 68 y.o. year old male with history below presents for follow up of diabetes mellitus. Doing well. No issues or complaints. Taking medications daily without adverse effects. No polydipsia, polyphagia, polyuria, or nocturia.  Blood sugars at home: 110-150 daily   Not Exercising regularly. Last A1C: 8.0   Eye MD: next month  Influenza vaccine:today   Past Medical History  Diagnosis Date  . Diabetes mellitus   . Hypertension      Home Meds: Prior to Admission medications   Medication Sig Start Date End Date Taking? Authorizing Provider  Alcohol Swabs PADS USE TO CHECK BLOOD SUGAR DAILY. DX CODE: 250.00 05/18/13  Yes Heather Elnora Morrison, PA-C  Blood Glucose Monitoring Suppl (BLOOD GLUCOSE METER) kit Use to test blood sugar daily. Dx code: 250.00 05/18/13  Yes Heather Elnora Morrison, PA-C  fish oil-omega-3 fatty acids 1000 MG capsule Take 1 g by mouth 2 (two) times daily after a meal.   Yes Historical Provider, MD  glucose blood test strip Use to check blood sugar daily. Dx code: 250.00 05/18/13  Yes Heather Elnora Morrison, PA-C  glyBURIDE (DIABETA) 5 MG tablet Take 1 tablet (5 mg total) by mouth daily with breakfast. 04/15/13  Yes Robyn Haber, MD  hydrochlorothiazide (HYDRODIURIL) 12.5 MG tablet Take 1 tablet (12.5 mg total) by mouth daily. 11/16/13  Yes Robyn Haber, MD  Lancets MISC Use to test blood sugar daily. Dx code:250.00 05/18/13  Yes Heather M [name], PA-C  losartan (COZAAR) 100 MG tablet Take 1 tablet (100 mg total) by mouth daily. NEED REFILLS 03/29/13  Yes Robyn Haber, MD  metoprolol succinate (TOPROL-XL) 100 MG 24 hr tablet Take 1 tablet (100 mg total) by mouth daily. Take with or immediately following a meal. 11/16/13  Yes Robyn Haber, MD  simvastatin (ZOCOR) 40 MG tablet Take 0.5 tablets  (20 mg total) by mouth every evening. 04/05/13  Yes Robyn Haber, MD  UNABLE TO FIND Use to check blood sugar daily. Dx code: 250.00 05/18/13  Yes [name], PA-C    Allergies:  Allergies  Allergen Reactions  . Codeine Other (See Comments)    CHILDHOOD  . Augmentin [Amoxicillin-Pot Clavulanate] Rash    History   Social History  . Marital Status: Married    Spouse Name: N/A    Number of Children: N/A  . Years of Education: N/A   Occupational History  . Not on file.   Social History Main Topics  . Smoking status: Never Smoker   . Smokeless tobacco: Not on file  . Alcohol Use: No  . Drug Use: No  . Sexual Activity: Not on file   Other Topics Concern  . Not on file   Social History Narrative  . No narrative on file     Review of Systems: Constitutional: negative for chills, fever, night sweats, weight changes, or fatigue  HEENT: negative for vision changes, hearing loss, congestion, rhinorrhea, or epistaxis Cardiovascular: negative for chest pain, palpitations, diaphoresis, DOE, orthopnea, or edema Respiratory: negative for hemoptysis, wheezing, shortness of breath, dyspnea, or cough Abdominal: negative for abdominal pain, nausea, vomiting, diarrhea, or constipation Dermatological: negative for rash, erythema, or wounds Neurologic: negative for headache, dizziness, or syncope Renal:  Negative for polyuria, polydipsia, or dysuria All other systems reviewed and are otherwise negative with the exception to those above  and in the HPI.   Physical Exam: Blood pressure 130/82, pulse 74, temperature 98 F (36.7 C), temperature source Oral, resp. rate 16, height 5' 7.75" (1.721 m), weight 241 lb 6.4 oz (109.498 kg), SpO2 100.00%., Body mass index is 36.97 kg/(m^2). General: Well developed, well nourished, in no acute distress. Head: Normocephalic, atraumatic, eyes without discharge, sclera non-icteric, nares are without discharge.   Fine nystagmus bilaterally Bilateral  auditory canals clear, TM's are without perforation, pearly grey and translucent with reflective cone of light bilaterally. Oral cavity moist, posterior pharynx without exudate, erythema, peritonsillar abscess, or post nasal drip.  Neck: Supple. No thyromegaly. Full ROM. No lymphadenopathy. Lungs: Clear bilaterally to auscultation without wheezes, rales, or rhonchi. Breathing is unlabored. Heart: RRR with S1 S2. No murmurs, rubs, or gallops appreciated. Abdomen: Soft, non-tender, non-distended with normoactive bowel sounds. No hepatosplenomegaly. No rebound/guarding. No obvious abdominal masses. Msk:  Strength and tone normal for age. Extremities/Skin: Warm and dry. No clubbing or cyanosis. No edema. No rashes, wounds, or suspicious lesions. Monofilament exam neg. Skin tags lower right lid. Neuro: Alert and oriented X 3. Moves all extremities spontaneously. Gait is normal. CNII-XII grossly in tact. Psych:  Responds to questions appropriately with a normal affect.   Labs:  Results for orders placed in visit on 11/16/13  POCT GLYCOSYLATED HEMOGLOBIN (HGB A1C)      Result Value Range   Hemoglobin A1C 7.1      ASSESSMENT AND PLAN:  68 y.o. year old male with Need for prophylactic vaccination and inoculation against influenza - Plan: Flu Vaccine QUAD 36+ mos IM  Type II or unspecified type diabetes mellitus without mention of complication, not stated as uncontrolled - Plan: HM Diabetes Foot Exam, POCT glycosylated hemoglobin (Hb A1C), Lipid panel, Microalbumin, urine  Hypertension - Plan: hydrochlorothiazide (HYDRODIURIL) 12.5 MG tablet, metoprolol succinate (TOPROL-XL) 100 MG 24 hr tablet, Comprehensive metabolic panel  Paresthesias - Plan: Comprehensive metabolic panel, Vitamin L54   -  Signed, Robyn Haber, MD 11/16/2013 10:26 AM

## 2013-11-16 NOTE — Patient Instructions (Addendum)
Diabetes and Exercise Exercising regularly is important. It is not just about losing weight. It has many health benefits, such as:  Improving your overall fitness, flexibility, and endurance.  Increasing your bone density.  Helping with weight control.  Decreasing your body fat.  Increasing your muscle strength.  Reducing stress and tension.  Improving your overall health. People with diabetes who exercise gain additional benefits because exercise:  Reduces appetite.  Improves the body's use of blood sugar (glucose).  Helps lower or control blood glucose.  Decreases blood pressure.  Helps control blood lipids (such as cholesterol and triglycerides).  Improves the body's use of the hormone insulin by:  Increasing the body's insulin sensitivity.  Reducing the body's insulin needs.  Decreases the risk for heart disease because exercising:  Lowers cholesterol and triglycerides levels.  Increases the levels of good cholesterol (such as high-density lipoproteins [HDL]) in the body.  Lowers blood glucose levels. YOUR ACTIVITY PLAN  Choose an activity that you enjoy and set realistic goals. Your health care provider or diabetes educator can help you make an activity plan that works for you. You can break activities into 2 or 3 sessions throughout the day. Doing so is as good as one long session. Exercise ideas include:  Taking the dog for a walk.  Taking the stairs instead of the elevator.  Dancing to your favorite song.  Doing your favorite exercise with a friend. RECOMMENDATIONS FOR EXERCISING WITH TYPE 1 OR TYPE 2 DIABETES   Check your blood glucose before exercising. If blood glucose levels are greater than 240 mg/dL, check for urine ketones. Do not exercise if ketones are present.  Avoid injecting insulin into areas of the body that are going to be exercised. For example, avoid injecting insulin into:  The arms when playing tennis.  The legs when  jogging.  Keep a record of:  Food intake before and after you exercise.  Expected peak times of insulin action.  Blood glucose levels before and after you exercise.  The type and amount of exercise you have done.  Review your records with your health care provider. Your health care provider will help you to develop guidelines for adjusting food intake and insulin amounts before and after exercising.  If you take insulin or oral hypoglycemic agents, watch for signs and symptoms of hypoglycemia. They include:  Dizziness.  Shaking.  Sweating.  Chills.  Confusion.  Drink plenty of water while you exercise to prevent dehydration or heat stroke. Body water is lost during exercise and must be replaced.  Talk to your health care provider before starting an exercise program to make sure it is safe for you. Remember, almost any type of activity is better than none. Document Released: 01/09/2004 Document Revised: 06/21/2013 Document Reviewed: 03/28/2013 ExitCare Patient Information 2014 [employer]. Diabetes Meal Planning Guide The diabetes meal planning guide is a tool to help you plan your meals and snacks. It is important for people with diabetes to manage their blood glucose (sugar) levels. Choosing the right foods and the right amounts throughout your day will help control your blood glucose. Eating right can even help you improve your blood pressure and reach or maintain a healthy weight. CARBOHYDRATE COUNTING MADE EASY When you eat carbohydrates, they turn to sugar. This raises your blood glucose level. Counting carbohydrates can help you control this level so you feel better. When you plan your meals by counting carbohydrates, you can have more flexibility in what you eat and balance your medicine   with your food intake. Carbohydrate counting simply means adding up the total amount of carbohydrate grams in your meals and snacks. Try to eat about the same amount at each meal. Foods  with carbohydrates are listed below. Each portion below is 1 carbohydrate serving or 15 grams of carbohydrates. Ask your dietician how many grams of carbohydrates you should eat at each meal or snack. Grains and Starches  1 slice bread.   English muffin or hotdog/hamburger bun.   cup cold cereal (unsweetened).   cup cooked pasta or rice.   cup starchy vegetables (corn, potatoes, peas, beans, winter squash).  1 tortilla (6 inches).   bagel.  1 waffle or pancake (size of a CD).   cup cooked cereal.  4 to 6 small crackers. *Whole grain is recommended. Fruit  1 cup fresh unsweetened berries, melon, papaya, pineapple.  1 small fresh fruit.   banana or mango.   cup fruit juice (4 oz unsweetened).   cup canned fruit in natural juice or water.  2 tbs dried fruit.  12 to 15 grapes or cherries. Milk and Yogurt  1 cup fat-free or 1% milk.  1 cup soy milk.  6 oz light yogurt with sugar-free sweetener.  6 oz low-fat soy yogurt.  6 oz plain yogurt. Vegetables  1 cup raw or  cup cooked is counted as 0 carbohydrates or a "free" food.  If you eat 3 or more servings at 1 meal, count them as 1 carbohydrate serving. Other Carbohydrates   oz chips or pretzels.   cup ice cream or frozen yogurt.   cup sherbet or sorbet.  2 inch square cake, no frosting.  1 tbs honey, sugar, jam, jelly, or syrup.  2 small cookies.  3 squares of graham crackers.  3 cups popcorn.  6 crackers.  1 cup broth-based soup.  Count 1 cup casserole or other mixed foods as 2 carbohydrate servings.  Foods with less than 20 calories in a serving may be counted as 0 carbohydrates or a "free" food. You may want to purchase a book or computer software that lists the carbohydrate gram counts of different foods. In addition, the nutrition facts panel on the labels of the foods you eat are a good source of this information. The label will tell you how big the serving size is and the  total number of carbohydrate grams you will be eating per serving. Divide this number by 15 to obtain the number of carbohydrate servings in a portion. Remember, 1 carbohydrate serving equals 15 grams of carbohydrate. SERVING SIZES Measuring foods and serving sizes helps you make sure you are getting the right amount of food. The list below tells how big or small some common serving sizes are.  1 oz.........4 stacked dice.  3 oz.........Deck of cards.  1 tsp........Tip of little finger.  1 tbs........Thumb.  2 tbs........Golf ball.   cup.......Half of a fist.  1 cup........A fist. SAMPLE DIABETES MEAL PLAN Below is a sample meal plan that includes foods from the grain and starches, dairy, vegetable, fruit, and meat groups. A dietician can individualize a meal plan to fit your calorie needs and tell you the number of servings needed from each food group. However, controlling the total amount of carbohydrates in your meal or snack is more important than making sure you include all of the food groups at every meal. You may interchange carbohydrate containing foods (dairy, starches, and fruits). The meal plan below is an example of a 2000 calorie diet   using carbohydrate counting. This meal plan has 17 carbohydrate servings. Breakfast  1 cup oatmeal (2 carb servings).   cup light yogurt (1 carb serving).  1 cup blueberries (1 carb serving).   cup almonds. Snack  1 large apple (2 carb servings).  1 low-fat string cheese stick. Lunch  Chicken breast salad.  1 cup spinach.   cup chopped tomatoes.  2 oz chicken breast, sliced.  2 tbs low-fat [place] dressing.  12 whole-wheat crackers (2 carb servings).  12 to 15 grapes (1 carb serving).  1 cup low-fat milk (1 carb serving). Snack  1 cup carrots.   cup hummus (1 carb serving). Dinner  3 oz broiled salmon.  1 cup brown rice (3 carb servings). Snack  1  cups steamed broccoli (1 carb serving) drizzled with 1  tsp olive oil and lemon juice.  1 cup light pudding (2 carb servings). DIABETES MEAL PLANNING WORKSHEET Your dietician can use this worksheet to help you decide how many servings of foods and what types of foods are right for you.  BREAKFAST Food Group and Servings / Carb Servings Grain/Starches __________________________________ Dairy __________________________________________ Vegetable ______________________________________ Fruit ___________________________________________ Meat __________________________________________ Fat ____________________________________________ LUNCH Food Group and Servings / Carb Servings Grain/Starches ___________________________________ Dairy ___________________________________________ Fruit ____________________________________________ Meat ___________________________________________ Fat _____________________________________________ [place] Food Group and Servings / Carb Servings Grain/Starches ___________________________________ Dairy ___________________________________________ Fruit ____________________________________________ Meat ___________________________________________ Fat _____________________________________________ SNACKS Food Group and Servings / Carb Servings Grain/Starches ___________________________________ Dairy ___________________________________________ Vegetable _______________________________________ Fruit ____________________________________________ Meat ___________________________________________ Fat _____________________________________________ DAILY TOTALS Starches _________________________ Vegetable ________________________ Fruit ____________________________ Dairy ____________________________ Meat ____________________________ Fat ______________________________ Document Released: 07/16/2005 Document Revised: 01/11/2012 Document Reviewed: 05/27/2009 ExitCare Patient Information 2014 [employer]. Paresthesia Paresthesia is an abnormal burning or prickling sensation. This sensation is generally felt in the hands, arms, legs, or feet. However, it may occur in any part of the body. It is usually not painful. The feeling may be described as:  Tingling or numbness.  "Pins and needles."  Skin crawling.  Buzzing.  Limbs "falling asleep."  Itching. Most people experience temporary (transient) paresthesia at some time in their lives. CAUSES  Paresthesia may occur when you breathe too quickly (hyperventilation). It can also occur without any apparent cause. Commonly, paresthesia occurs when pressure is placed on a nerve. The feeling quickly goes away once the pressure is removed. For some people, however, paresthesia is a long-lasting (chronic) condition caused by an underlying disorder. The underlying disorder may be:  A traumatic, direct injury to nerves. Examples include a:  Broken (fractured) neck.  Fractured skull.  A disorder affecting the brain and spinal cord (central nervous system). Examples include:  Transverse myelitis.  Encephalitis.  Transient ischemic attack.  Multiple sclerosis.  Stroke.  Tumor or blood vessel problems, such as an arteriovenous malformation pressing against the brain or spinal cord.  A condition that damages the peripheral nerves (peripheral neuropathy). Peripheral nerves are not part of the brain and spinal cord. These conditions include:  Diabetes.  Peripheral vascular disease.  Nerve entrapment syndromes, such as carpal tunnel syndrome.  Shingles.  Hypothyroidism.  Vitamin B12 deficiencies.  Alcoholism.  Heavy metal poisoning (lead, arsenic).  Rheumatoid arthritis.  Systemic lupus erythematosus. DIAGNOSIS  Your caregiver will attempt to find the underlying cause of your paresthesia. Your caregiver may:  Take your medical history.  Perform a physical exam.  Order various lab tests.  Order imaging  tests. TREATMENT  Treatment for paresthesia depends on the underlying cause. HOME CARE INSTRUCTIONS  Avoid drinking alcohol.  You may consider massage or acupuncture to help relieve your symptoms.  Keep all follow-up appointments as directed by your caregiver. SEEK IMMEDIATE MEDICAL CARE IF:   You feel weak.  You have trouble walking or moving.  You have problems with speech or vision.  You feel confused.  You cannot control your bladder or bowel movements.  You feel numbness after an injury.  You faint.  Your burning or prickling feeling gets worse when walking.  You have pain, cramps, or dizziness.  You develop a rash. MAKE SURE YOU:  Understand these instructions.  Will watch your condition.  Will get help right away if you are not doing well or get worse. Document Released: 10/09/2002 Document Revised: 01/11/2012 Document Reviewed: 07/10/2011 [facility] Patient Information 2014 [place].

## 2013-12-05 ENCOUNTER — Telehealth: Payer: Self-pay

## 2013-12-05 NOTE — Telephone Encounter (Signed)
PT IS NEEDING TO KNOW IF DR LAUENSTEIN WAS GOING TO GO THRU MAIL ORDER OR SHOULD HE BE GETTING B12  OVER THE COUNTER   BEST NUMBER [phone]

## 2013-12-06 NOTE — Telephone Encounter (Signed)
Take the B12 supplement over the counter

## 2013-12-06 NOTE — Telephone Encounter (Signed)
Spoke with pt, he would like to know how many mgs?

## 2013-12-07 NOTE — Telephone Encounter (Signed)
OTC vitamin B12 comes as 1 mg or 1000 mcg and is taken daily as a supplement.

## 2013-12-08 NOTE — Telephone Encounter (Signed)
Pt.notified

## 2014-02-09 ENCOUNTER — Telehealth: Payer: Self-pay

## 2014-02-09 NOTE — Telephone Encounter (Signed)
[facility]andon [facility]icemail for medical records checking status on Hedis request. Cb# [phone] reference ID # [id number] will forward to billing office.

## 2014-02-09 NOTE — Telephone Encounter (Signed)
Cala BradfordKimberly from [facility] called to check the status of a records request made by them via fax on 01/25/14 and 01/11/14 and 12/28/13. Please return call at [phone]. Reference ID: [id number].

## 2014-02-13 NOTE — Telephone Encounter (Signed)
[facility] and [facility]. [facility] does not process insurance requests.

## 2014-03-15 ENCOUNTER — Encounter: Payer: Self-pay | Admitting: Family Medicine

## 2014-03-15 ENCOUNTER — Ambulatory Visit (INDEPENDENT_AMBULATORY_CARE_PROVIDER_SITE_OTHER): Payer: Medicare PPO | Admitting: Family Medicine

## 2014-03-15 VITALS — BP 150/70 | HR 97 | Temp 98.8°F | Resp 16 | Ht 67.5 in | Wt 237.6 lb

## 2014-03-15 DIAGNOSIS — E119 Type 2 diabetes mellitus without complications: Secondary | ICD-10-CM

## 2014-03-15 DIAGNOSIS — B351 Tinea unguium: Secondary | ICD-10-CM

## 2014-03-15 DIAGNOSIS — J209 Acute bronchitis, unspecified: Secondary | ICD-10-CM

## 2014-03-15 DIAGNOSIS — I10 Essential (primary) hypertension: Secondary | ICD-10-CM

## 2014-03-15 LAB — CBC WITH DIFFERENTIAL/PLATELET
Basophils Absolute: 0 10*3/uL (ref 0.0–0.1)
Basophils Relative: 0 % (ref 0–1)
Eosinophils Absolute: 0 10*3/uL (ref 0.0–0.7)
Eosinophils Relative: 0 % (ref 0–5)
HCT: 35.2 % — ABNORMAL LOW (ref 39.0–52.0)
Hemoglobin: 11.9 g/dL — ABNORMAL LOW (ref 13.0–17.0)
Lymphocytes Relative: 15 % (ref 12–46)
Lymphs Abs: 3 10*3/uL (ref 0.7–4.0)
MCH: 28.3 pg (ref 26.0–34.0)
MCHC: 33.8 g/dL (ref 30.0–36.0)
MCV: 83.6 fL (ref 78.0–100.0)
Monocytes Absolute: 3.4 10*3/uL — ABNORMAL HIGH (ref 0.1–1.0)
Monocytes Relative: 17 % — ABNORMAL HIGH (ref 3–12)
Neutro Abs: 13.5 10*3/uL — ABNORMAL HIGH (ref 1.7–7.7)
Neutrophils Relative %: 68 % (ref 43–77)
Platelets: 171 10*3/uL (ref 150–400)
RBC: 4.21 MIL/uL — ABNORMAL LOW (ref 4.22–5.81)
RDW: 13.5 % (ref 11.5–15.5)
WBC: 19.9 10*3/uL — ABNORMAL HIGH (ref 4.0–10.5)

## 2014-03-15 LAB — LIPID PANEL
Cholesterol: 108 mg/dL (ref 0–200)
HDL: 31 mg/dL — ABNORMAL LOW (ref >39)
LDL Cholesterol: 60 mg/dL (ref 0–99)
Total CHOL/HDL Ratio: 3.5 Ratio
Triglycerides: 87 mg/dL (ref <150)
VLDL: 17 mg/dL (ref 0–40)

## 2014-03-15 LAB — COMPREHENSIVE METABOLIC PANEL
ALT: 16 U/L (ref 0–53)
AST: 21 U/L (ref 0–37)
Albumin: 3.8 g/dL (ref 3.5–5.2)
Alkaline Phosphatase: 44 U/L (ref 39–117)
BUN: 33 mg/dL — ABNORMAL HIGH (ref 6–23)
CO2: 23 mEq/L (ref 19–32)
Calcium: 8.8 mg/dL (ref 8.4–10.5)
Chloride: 98 mEq/L (ref 96–112)
Creat: 1.61 mg/dL — ABNORMAL HIGH (ref 0.50–1.35)
Glucose, Bld: 167 mg/dL — ABNORMAL HIGH (ref 70–99)
Potassium: 3.7 mEq/L (ref 3.5–5.3)
Sodium: 134 mEq/L — ABNORMAL LOW (ref 135–145)
Total Bilirubin: 1.4 mg/dL — ABNORMAL HIGH (ref 0.2–1.2)
Total Protein: 7.5 g/dL (ref 6.0–8.3)

## 2014-03-15 MED ORDER — TERBINAFINE HCL 250 MG PO TABS: 250.0000 mg | ORAL_TABLET | Freq: Every day | ORAL | Status: DC

## 2014-03-15 MED ORDER — AZITHROMYCIN 250 MG PO TABS: ORAL_TABLET | ORAL | Status: DC

## 2014-03-15 MED ORDER — LOSARTAN POTASSIUM 100 MG PO TABS: 100.0000 mg | ORAL_TABLET | Freq: Every day | ORAL | Status: DC

## 2014-03-15 NOTE — Patient Instructions (Signed)
Use KNOX capsules to strengthen finger nails.

## 2014-03-15 NOTE — Progress Notes (Signed)
° °Subjective:  °This chart was scribed for Timothy Lauenstein, MD by Priscilla Tutu, ED Scribe. This patient was seen in room 25 and the patient's care was started at 10:05 AM. ° ° Patient ID: Timothy Martin, male    DOB: 03/11/1946, 68 y.o.   MRN: [id number] ° °Chief Complaint  °Patient presents with  °• Diabetes  ° ° °HPI °HPI Comments: °Timothy Martin is a 68 y.o. male veteran married to Timothy Martin, who presents to the [facility] for a follow up on her DM.  ° °His last office visit with me was 04/15/2013. Where he was seen for elevated blood sugar. His A1C was 13.4 at the time.  ° °Today pt is complaining of a dry cough that started approximately 3 weeks ago.  Pt reports an associated fever with a measured TMAX of 102. Current office temperature is 98.8. He is also complaining of a nail fungus in both big toes. Advised pt that i will prescribe Lamisil for his nail fungus. Advised him that at first he may not see any improvements but after 90 days he will start to notice a difference. Pt agrees with the proposed treatment plan.  ° °Pt states that his blood sugar has been doing well. He reports recorded home blood measurement as 110-135. He is requesting a refill for Losartan.  ° °Pt states that his last Tetanus vaccine was 2012. Last eye exam was 2 years ago. ° °Wife states that pt is allergic to Augmentin, he breaks out in hives.  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 °Allergies  °Allergen Reactions  °• Codeine Other (See Comments)  °  CHILDHOOD  °• Augmentin [Amoxicillin-Pot Clavulanate] Rash  ° °Prior to Admission medications   °Medication Sig Start Date End Date Taking?  Authorizing Provider  °Alcohol Swabs PADS USE TO CHECK BLOOD SUGAR DAILY. DX CODE: 250.00 05/18/13   Heather M [name], PA-C  °Blood Glucose Monitoring Suppl (BLOOD GLUCOSE METER) kit Use to test blood sugar daily. Dx code: 250.00 05/18/13   Heather M [name], PA-C  °fish oil-omega-3 fatty acids 1000 MG capsule Take 1 g by mouth 2 (two) times daily after a meal.    Historical Provider, MD  °glucose blood test strip Use to check blood sugar daily. Dx code: 250.00 05/18/13   Heather M [name], PA-C  °glyBURIDE (DIABETA) 5 MG tablet Take 1 tablet (5 mg total) by mouth daily with breakfast. 04/15/13   Timothy Lauenstein, MD  °hydrochlorothiazide (HYDRODIURIL) 12.5 MG tablet Take 1 tablet (12.5 mg total) by mouth daily. 11/16/13   Timothy Lauenstein, MD  °Lancets MISC Use to test blood sugar daily. Dx code:250.00 05/18/13   Heather M [name], PA-C  °losartan (COZAAR) 100 MG tablet Take 1 tablet (100 mg total) by mouth daily. NEED REFILLS 03/29/13   Timothy Lauenstein, MD  °metoprolol succinate (TOPROL-XL) 100 MG 24 hr tablet Take 1 tablet (100 mg total) by mouth daily. Take with or immediately following a meal. 11/16/13   Timothy Lauenstein, MD  °simvastatin (ZOCOR) 40 MG tablet Take 0.5 tablets (20 mg total) by mouth every evening. 04/05/13   Timothy Lauenstein, MD  °UNABLE TO FIND Use to   check blood sugar daily. Dx code: 250.00 05/18/13   Heather M [name], PA-C  ° °History  ° °Social History  °• Marital Status: Married  °  Spouse Name: N/A  °  Number of Children: N/A  °• Years of Education: N/A  ° °Occupational History  °• Not on file.  ° °Social History Main Topics  °• Smoking status: Never Smoker   °• Smokeless tobacco: Not on file  °• Alcohol Use: No  °• Drug Use: No  °• Sexual Activity: Not on file  ° °Other Topics Concern  °• Not on file  ° °Social History Narrative  °• No narrative on file  ° °Review of Systems °A complete 10 system review of systems was obtained and all systems are negative except as noted in the HPI and PMH.  ° °   °Objective:    ° Physical Exam  °Nursing note and vitals reviewed. °Constitutional: He is oriented to person, place, and time. He appears well-developed and well-nourished. No distress.  °HENT:  °Head: Normocephalic and atraumatic.  °Right Ear: External ear normal.  °Left Ear: External ear normal.  °Nose: Nose normal.  °Mouth/Throat: Oropharynx is clear and moist.  °Eyes: Conjunctivae are normal. Pupils are equal, round, and reactive to light. Right eye exhibits no discharge. Left eye exhibits no discharge.  °Bilateral nystagmus  °Neck: Normal range of motion. Neck supple. No tracheal deviation present. No thyromegaly present.  °Cardiovascular: Normal rate and regular rhythm.  Exam reveals no gallop and no friction rub.   °Murmur heard. °Pulmonary/Chest: Effort normal and breath sounds normal. No stridor. No respiratory distress. He has no wheezes. He has no rales. He exhibits no tenderness.  °Abdominal: Soft. Bowel sounds are normal. He exhibits no distension. There is no tenderness.  °Musculoskeletal: Normal range of motion. He exhibits no edema and no tenderness.  °Lymphadenopathy:  °  He has no cervical adenopathy.  °Neurological: He is alert and oriented to person, place, and time.  °Skin: Skin is warm and dry. No rash noted. No erythema. No pallor.  °Psychiatric: He has a normal mood and affect. His behavior is normal. Thought content normal.  ° °Wt Readings from Last 3 Encounters:  °03/15/14 237 lb 9.6 oz (107.775 kg)  °11/16/13 241 lb 6.4 oz (109.498 kg)  °08/03/13 245 lb (111.131 kg)  ° °Filed Vitals:  ° 03/15/14 0942  °BP: 150/70  °Pulse: 97  °Temp: 98.8 °F (37.1 °C)  °TempSrc: Oral  °Resp: 16  °Height: 5' 7.5" (1.715 m)  °Weight: 237 lb 9.6 oz (107.775 kg)  °SpO2: 95%  ° ° °Assessment & Plan:  ° ° °Type II or unspecified type diabetes mellitus without mention of complication, not stated as uncontrolled - Plan: HM Diabetes Foot Exam, Comprehensive metabolic panel, Lipid panel, Hemoglobin A1c, CANCELED: POCT glycosylated  hemoglobin (Hb A1C) ° °Hypertension - Plan: losartan (COZAAR) 100 MG tablet ° °Onychomycosis - Plan: terbinafine (LAMISIL) 250 MG tablet ° °Acute bronchitis - Plan: azithromycin (ZITHROMAX Z-PAK) 250 MG tablet, CBC with Differential ° °Signed, °Timothy Lauenstein, MD ° ° ° ° °I personally performed the services described in this documentation, which was scribed in my presence. The recorded information has been reviewed and is accurate. ° ° °

## 2014-03-16 LAB — HEMOGLOBIN A1C
Hgb A1c MFr Bld: 8.1 % — ABNORMAL HIGH (ref <5.7)
Mean Plasma Glucose: 186 mg/dL — ABNORMAL HIGH (ref <117)

## 2014-03-18 ENCOUNTER — Other Ambulatory Visit (INDEPENDENT_AMBULATORY_CARE_PROVIDER_SITE_OTHER): Payer: Medicare PPO | Admitting: Radiology

## 2014-03-18 ENCOUNTER — Telehealth: Payer: Self-pay

## 2014-03-18 DIAGNOSIS — D649 Anemia, unspecified: Secondary | ICD-10-CM

## 2014-03-18 DIAGNOSIS — N289 Disorder of kidney and ureter, unspecified: Secondary | ICD-10-CM

## 2014-03-18 DIAGNOSIS — D72829 Elevated white blood cell count, unspecified: Secondary | ICD-10-CM

## 2014-03-18 LAB — CBC WITH DIFFERENTIAL/PLATELET
Basophils Absolute: 0 10*3/uL (ref 0.0–0.1)
Basophils Relative: 0 % (ref 0–1)
Eosinophils Absolute: 0.3 10*3/uL (ref 0.0–0.7)
Eosinophils Relative: 2 % (ref 0–5)
HCT: 36 % — ABNORMAL LOW (ref 39.0–52.0)
Hemoglobin: 12.1 g/dL — ABNORMAL LOW (ref 13.0–17.0)
Lymphocytes Relative: 17 % (ref 12–46)
Lymphs Abs: 2.5 10*3/uL (ref 0.7–4.0)
MCH: 28.3 pg (ref 26.0–34.0)
MCHC: 33.6 g/dL (ref 30.0–36.0)
MCV: 84.1 fL (ref 78.0–100.0)
Monocytes Absolute: 2.5 10*3/uL — ABNORMAL HIGH (ref 0.1–1.0)
Monocytes Relative: 17 % — ABNORMAL HIGH (ref 3–12)
Neutro Abs: 9.5 10*3/uL — ABNORMAL HIGH (ref 1.7–7.7)
Neutrophils Relative %: 64 % (ref 43–77)
Platelets: 215 10*3/uL (ref 150–400)
RBC: 4.28 MIL/uL (ref 4.22–5.81)
RDW: 13.8 % (ref 11.5–15.5)
WBC: 14.9 10*3/uL — ABNORMAL HIGH (ref 4.0–10.5)

## 2014-03-18 LAB — COMPLETE METABOLIC PANEL WITH GFR
ALT: 39 U/L (ref 0–53)
AST: 40 U/L — ABNORMAL HIGH (ref 0–37)
Albumin: 3.6 g/dL (ref 3.5–5.2)
Alkaline Phosphatase: 46 U/L (ref 39–117)
BUN: 47 mg/dL — ABNORMAL HIGH (ref 6–23)
CO2: 25 mEq/L (ref 19–32)
Calcium: 9 mg/dL (ref 8.4–10.5)
Chloride: 96 mEq/L (ref 96–112)
Creat: 1.74 mg/dL — ABNORMAL HIGH (ref 0.50–1.35)
GFR, Est African American: 46 mL/min — ABNORMAL LOW
GFR, Est Non African American: 40 mL/min — ABNORMAL LOW
Glucose, Bld: 117 mg/dL — ABNORMAL HIGH (ref 70–99)
Potassium: 3.7 mEq/L (ref 3.5–5.3)
Sodium: 133 mEq/L — ABNORMAL LOW (ref 135–145)
Total Bilirubin: 0.8 mg/dL (ref 0.2–1.2)
Total Protein: 7.7 g/dL (ref 6.0–8.3)

## 2014-03-18 NOTE — Telephone Encounter (Signed)
Dr. Elbert EwingsL, pt came in for labs only but I didn't see anything ordered. Read your message on his previous labs and I ordered a CMP and CBC on him.

## 2014-03-18 NOTE — Progress Notes (Signed)
Pt here for labs only. 

## 2014-04-11 ENCOUNTER — Other Ambulatory Visit: Payer: Self-pay | Admitting: Family Medicine

## 2014-04-11 ENCOUNTER — Other Ambulatory Visit: Payer: Self-pay

## 2014-04-11 DIAGNOSIS — E1059 Type 1 diabetes mellitus with other circulatory complications: Secondary | ICD-10-CM

## 2014-04-11 MED ORDER — GLYBURIDE 5 MG PO TABS: 5.0000 mg | ORAL_TABLET | Freq: Every day | ORAL | Status: DC

## 2014-05-31 ENCOUNTER — Other Ambulatory Visit: Payer: Self-pay | Admitting: Family Medicine

## 2014-07-19 ENCOUNTER — Encounter: Payer: Self-pay | Admitting: Family Medicine

## 2014-07-19 ENCOUNTER — Ambulatory Visit (INDEPENDENT_AMBULATORY_CARE_PROVIDER_SITE_OTHER): Payer: Medicare PPO | Admitting: Family Medicine

## 2014-07-19 VITALS — BP 151/77 | HR 74 | Temp 97.4°F | Resp 16 | Ht 67.5 in | Wt 244.0 lb

## 2014-07-19 DIAGNOSIS — E119 Type 2 diabetes mellitus without complications: Secondary | ICD-10-CM

## 2014-07-19 DIAGNOSIS — I10 Essential (primary) hypertension: Secondary | ICD-10-CM

## 2014-07-19 DIAGNOSIS — Z23 Encounter for immunization: Secondary | ICD-10-CM

## 2014-07-19 DIAGNOSIS — E1059 Type 1 diabetes mellitus with other circulatory complications: Secondary | ICD-10-CM

## 2014-07-19 LAB — POCT GLYCOSYLATED HEMOGLOBIN (HGB A1C): Hemoglobin A1C: 7.1

## 2014-07-19 MED ORDER — LOSARTAN POTASSIUM 100 MG PO TABS: 100.0000 mg | ORAL_TABLET | Freq: Every day | ORAL | Status: DC

## 2014-07-19 MED ORDER — SIMVASTATIN 40 MG PO TABS: 20.0000 mg | ORAL_TABLET | Freq: Every day | ORAL | Status: DC

## 2014-07-19 MED ORDER — METOPROLOL SUCCINATE ER 100 MG PO TB24: 100.0000 mg | ORAL_TABLET | Freq: Every day | ORAL | Status: DC

## 2014-07-19 MED ORDER — GLYBURIDE 5 MG PO TABS: 5.0000 mg | ORAL_TABLET | Freq: Every day | ORAL | Status: DC

## 2014-07-19 NOTE — Patient Instructions (Signed)
Pneumococcal Conjugate Vaccine: What You Need to Know Your doctor recommends that you, or your child, get a dose of PCV13 today. 1. Why get vaccinated? Pneumococcal conjugate vaccine (called PCV13 or Prevnar 13) is recommended to protect infants and toddlers, and some older children and adults with certain health conditions, from pneumococcal disease. Pneumococcal disease is caused by infection with Streptococcus pneumoniae bacteria. These bacteria can spread from person to person through close contact. Pneumococcal disease can lead to severe health problems, including pneumonia, blood infections, and meningitis. Meningitis is an infection of the covering of the brain. Pneumococcal meningitis is fairly rare (less than 1 case per 100,000 people each year), but it leads to other health problems, including deafness and brain damage. In children, it is fatal in about 1 case out of 10. Children younger than two are at higher risk for serious disease than older children. People with certain medical conditions, people over age 65, and cigarette smokers are also at higher risk. Before vaccine, pneumococcal infections caused many problems each year in the [place] in children younger than 5, including:  more than 700 cases of meningitis,  13,000 blood infections,  about 5 million ear infections, and  about 200 deaths. About 4,000 adults still die each year because of pneumococcal infections. Pneumococcal infections can be hard to treat because some strains are resistant to antibiotics. This makes prevention through vaccination even more important. 2. PCV13 vaccine There are more than 90 types of pneumococcal bacteria. PCV13 protects against 13 of them. These 13 strains cause most severe infections in children and about half of infections in adults.  PCV13 is routinely given to children at 2, 4, 6, and 12-15 months of age. Children in this age range are at greatest risk for serious diseases caused  by pneumococcal infection. PCV13 vaccine may also be recommended for some older children or adults. Your doctor can give you details. A second type of pneumococcal vaccine, called PPSV23, may also be given to some children and adults, including anyone over age 65. There is a separate Vaccine Information Statement for this vaccine. 3. Precautions  Anyone who has ever had a life-threatening allergic reaction to a dose of this vaccine, to an earlier pneumococcal vaccine called PCV7 (or Prevnar), or to any vaccine containing diphtheria toxoid (for example, DTaP), should not get PCV13. Anyone with a severe allergy to any component of PCV13 should not get the vaccine. Tell your doctor if the person being vaccinated has any severe allergies. If the person scheduled for vaccination is sick, your doctor might decide to reschedule the shot on another day. Your doctor can give you more information about any of these precautions. 4. What are the risks of PCV13 vaccine?  With any medicine, including vaccines, there is a chance of side effects. These are usually mild and go away on their own, but serious reactions are also possible. Reported problems associated with PCV13 vary by dose and age, but generally:  About half of children became drowsy after the shot, had a temporary loss of appetite, or had redness or tenderness where the shot was given.  About 1 out of 3 had swelling where the shot was given.  About 1 out of 3 had a mild fever, and about 1 in 20 had a higher fever (over 102.2F).  Up to about 8 out of 10 became fussy or irritable. Adults receiving the vaccine have reported redness, pain, and swelling where the shot was given. Mild fever, fatigue, headache, chills, or   muscle pain have also been reported. Life-threatening allergic reactions from any vaccine are very rare. 5. What if there is a serious reaction? What should I look for?  Look for anything that concerns you, such as signs of a  severe allergic reaction, very high fever, or behavior changes. Signs of a severe allergic reaction can include hives, swelling of the face and throat, difficulty breathing, a fast heartbeat, dizziness, and weakness. These would start a few minutes to a few hours after the vaccination. What should I do?  If you think it is a severe allergic reaction or other emergency that can't wait, call 9-1-1 or get the person to the nearest hospital. Otherwise, call your doctor.  Afterward, the reaction should be reported to the Vaccine Adverse Event Reporting System (VAERS). Your doctor might file this report, or you can do it yourself through the VAERS web site at www.vaers.hhs.gov, or by calling 1-800-822-7967. VAERS is only for reporting reactions. They do not give medical advice. 6. The National Vaccine Injury Compensation Program The National Vaccine Injury Compensation Program (VICP) is a federal program that was created to compensate people who may have been injured by certain vaccines. Persons who believe they may have been injured by a vaccine can learn about the program and about filing a claim by calling 1-800-338-2382 or visiting the VICP website at www.hrsa.gov/vaccinecompensation. 7. How can I learn more?  Ask your doctor.  Call your local or state health department.  Contact the Centers for Disease Control and Prevention (CDC):  Call 1-800-232-4636 (1-800-CDC-INFO) or  Visit CDC's website at www.cdc.gov/vaccines CDC PCV13 Vaccine VIS (Interim) (12/30/11) Document Released: 08/16/2006 Document Revised: 03/05/2014 Document Reviewed: 12/08/2013 ExitCare Patient Information 2015 [employer]. This information is not intended to replace advice given to you by your health care provider. Make sure you discuss any questions you have with your health care provider. Influenza Vaccine (Flu Vaccine, Inactivated or Recombinant) 2014-2015: What You Need to Know 1. Why get vaccinated? Influenza  ("flu") is a contagious disease that spreads around the [place] every winter, usually between October and May. Flu is caused by influenza viruses, and is spread mainly by coughing, sneezing, and close contact. Anyone can get flu, but the risk of getting flu is highest among children. Symptoms come on suddenly and may last several days. They can include:  fever/chills  sore throat  muscle aches  fatigue  cough  headache  runny or stuffy nose Flu can make some people much sicker than others. These people include young children, people 65 and older, pregnant women, and people with certain health conditions-such as heart, lung or kidney disease, nervous system disorders, or a weakened immune system. Flu vaccination is especially important for these people, and anyone in close contact with them. Flu can also lead to pneumonia, and make existing medical conditions worse. It can cause diarrhea and seizures in children. Each year thousands of people in the [place] die from flu, and many more are hospitalized. Flu vaccine is the best protection against flu and its complications. Flu vaccine also helps prevent spreading flu from person to person. 2. Inactivated and recombinant flu vaccines You are getting an injectable flu vaccine, which is either an "inactivated" or "recombinant" vaccine. These vaccines do not contain any live influenza virus. They are given by injection with a needle, and often called the "flu shot."  A different live, attenuated (weakened) influenza vaccine is sprayed into the nostrils. This vaccine is described in a separate Vaccine Information   Statement. Flu vaccination is recommended every year. Some children 6 months through 8 years of age might need two doses during one year. Flu viruses are always changing. Each year's flu vaccine is made to protect against 3 or 4 viruses that are likely to cause disease that year. Flu vaccine cannot prevent all cases of flu, but  it is the best defense against the disease.  It takes about 2 weeks for protection to develop after the vaccination, and protection lasts several months to a year. Some illnesses that are not caused by influenza virus are often mistaken for flu. Flu vaccine will not prevent these illnesses. It can only prevent influenza. Some inactivated flu vaccine contains a very small amount of a mercury-based preservative called thimerosal. Studies have shown that thimerosal in vaccines is not harmful, but flu vaccines that do not contain a preservative are available. 3. Some people should not get this vaccine Tell the person who gives you the vaccine:  If you have any severe, life-threatening allergies. If you ever had a life-threatening allergic reaction after a dose of flu vaccine, or have a severe allergy to any part of this vaccine, including (for example) an allergy to gelatin, antibiotics, or eggs, you may be advised not to get vaccinated. Most, but not all, types of flu vaccine contain a small amount of egg protein.  If you ever had Guillain-Barr Syndrome (a severe paralyzing illness, also called GBS). Some people with a history of GBS should not get this vaccine. This should be discussed with your doctor.  If you are not feeling well. It is usually okay to get flu vaccine when you have a mild illness, but you might be advised to wait until you feel better. You should come back when you are better. 4. Risks of a vaccine reaction With a vaccine, like any medicine, there is a chance of side effects. These are usually mild and go away on their own. Problems that could happen after any vaccine:  Brief fainting spells can happen after any medical procedure, including vaccination. Sitting or lying down for about 15 minutes can help prevent fainting, and injuries caused by a fall. Tell your doctor if you feel dizzy, or have vision changes or ringing in the ears.  Severe shoulder pain and reduced range of  motion in the arm where a shot was given can happen, very rarely, after a vaccination.  Severe allergic reactions from a vaccine are very rare, estimated at less than 1 in a million doses. If one were to occur, it would usually be within a few minutes to a few hours after the vaccination. Mild problems following inactivated flu vaccine:  soreness, redness, or swelling where the shot was given  hoarseness  sore, red or itchy eyes  cough  fever  aches  headache  itching  fatigue If these problems occur, they usually begin soon after the shot and last 1 or 2 days. Moderate problems following inactivated flu vaccine:  Young children who get inactivated flu vaccine and pneumococcal vaccine (PCV13) at the same time may be at increased risk for seizures caused by fever. Ask your doctor for more information. Tell your doctor if a child who is getting flu vaccine has ever had a seizure. Inactivated flu vaccine does not contain live flu virus, so you cannot get the flu from this vaccine. As with any medicine, there is a very remote chance of a vaccine causing a serious injury or death. The safety of vaccines is   always being monitored. For more information, visit: www.cdc.gov/vaccinesafety/ 5. What if there is a serious reaction? What should I look for?  Look for anything that concerns you, such as signs of a severe allergic reaction, very high fever, or behavior changes. Signs of a severe allergic reaction can include hives, swelling of the face and throat, difficulty breathing, a fast heartbeat, dizziness, and weakness. These would start a few minutes to a few hours after the vaccination. What should I do?  If you think it is a severe allergic reaction or other emergency that can't wait, call 9-1-1 and get the person to the nearest hospital. Otherwise, call your doctor.  Afterward, the reaction should be reported to the Vaccine Adverse Event Reporting System (VAERS). Your doctor should  file this report, or you can do it yourself through the VAERS web site at www.vaers.hhs.gov, or by calling 1-800-822-7967. VAERS does not give medical advice. 6. The National Vaccine Injury Compensation Program The National Vaccine Injury Compensation Program (VICP) is a federal program that was created to compensate people who may have been injured by certain vaccines. Persons who believe they may have been injured by a vaccine can learn about the program and about filing a claim by calling 1-800-338-2382 or visiting the VICP website at www.hrsa.gov/vaccinecompensation. There is a time limit to file a claim for compensation. 7. How can I learn more?  Ask your health care provider.  Call your local or state health department.  Contact the Centers for Disease Control and Prevention (CDC):  Call 1-800-232-4636 (1-800-CDC-INFO) or  Visit CDC's website at www.cdc.gov/flu CDC Vaccine Information Statement (Interim) Inactivated Influenza Vaccine (06/20/2013) Document Released: 08/13/2006 Document Revised: 03/05/2014 Document Reviewed: 10/06/2013 ExitCare Patient Information 2015 [employer]. This information is not intended to replace advice given to you by your health care provider. Make sure you discuss any questions you have with your health care provider.  

## 2014-07-19 NOTE — Progress Notes (Signed)
This chart was scribed for Elvina Sidle, MD by Charline Bills, ED Scribe. The patient was seen in room 25. Patient's care was started at 9:46 AM.  Patient ID: Timothy Martin, male   DOB: Apr 03, 1946, 68 y.o.   MRN: [id number]    Patient ID: Timothy Martin MRN: [id number], DOB: [birth year], 68 y.o. Date of Encounter: 07/19/2014, 9:46 AM  Primary Physician: PROVIDER NOT IN SYSTEM  Chief Complaint  Patient presents with  . Follow-up    DM    HPI: 68 y.o. year old male with history below presents for DM follow-up. Pt states that he checks his blood sugar regularly with readings between 115 and 135. He denies loss of sensation in his feet.   Pt reports with a wart on his L pointer finger that he wants to have checked during this visit. Pt states that the spot is beginning to fade.   Pt exercises every day except for Monday's. Pt states that he works 4 hours on [employer]; pt [employer] for his friend.   Past Medical History  Diagnosis Date  . Diabetes mellitus   . Hypertension      Home Meds: Prior to Admission medications   Medication Sig Start Date End Date Taking? Authorizing Provider  Alcohol Swabs PADS USE TO CHECK BLOOD SUGAR DAILY. DX CODE: 250.00 05/18/13   Nelva Nay, PA-C  azithromycin (ZITHROMAX Z-PAK) 250 MG tablet Take as directed on pack 03/15/14   Elvina Sidle, MD  Blood Glucose Monitoring Suppl (BLOOD GLUCOSE METER) kit Use to test blood sugar daily. Dx code: 250.00 05/18/13   Nelva Nay, PA-C  fish oil-omega-3 fatty acids 1000 MG capsule Take 1 g by mouth 2 (two) times daily after a meal.    Historical Provider, MD  glucose blood test strip Use to check blood sugar daily. Dx code: 250.00 05/18/13   Nelva Nay, PA-C  glyBURIDE (DIABETA) 5 MG tablet Take 1 tablet (5 mg total) by mouth daily with breakfast. 04/11/14   Elvina Sidle, MD  hydrochlorothiazide (HYDRODIURIL) 12.5 MG tablet Take 1 tablet (12.5 mg total) by mouth daily. 11/16/13   Elvina Sidle, MD  Lancets MISC Use to test blood sugar daily. Dx code:250.00 05/18/13   Nelva Nay, PA-C  losartan (COZAAR) 100 MG tablet Take 1 tablet (100 mg total) by mouth daily. NEED REFILLS 03/15/14   Elvina Sidle, MD  metoprolol succinate (TOPROL-XL) 100 MG 24 hr tablet Take 1 tablet (100 mg total) by mouth daily. Take with or immediately following a meal. 11/16/13   Elvina Sidle, MD  OVER THE COUNTER MEDICATION OTC B 12 taking daily    Historical Provider, MD  simvastatin (ZOCOR) 40 MG tablet TAKE 1/2 TABLET EVERY EVENING    Elvina Sidle, MD  terbinafine (LAMISIL) 250 MG tablet Take 1 tablet (250 mg total) by mouth daily. 03/15/14   Elvina Sidle, MD  UNABLE TO FIND Use to check blood sugar daily. Dx code: 250.00 05/18/13   Nelva Nay, PA-C    Allergies:  Allergies  Allergen Reactions  . Codeine Other (See Comments)    CHILDHOOD  . Augmentin [Amoxicillin-Pot Clavulanate] Rash    History   Social History  . Marital Status: Married    Spouse Name: N/A    Number of Children: N/A  . Years of Education: N/A   Occupational History  . Not on file.   Social History Main Topics  . Smoking status: Never Smoker   . Smokeless tobacco: Not on  file  . Alcohol Use: No  . Drug Use: No  . Sexual Activity: Not on file   Other Topics Concern  . Not on file   Social History Narrative  . No narrative on file     Review of Systems: Constitutional: negative for chills, fever, night sweats, weight changes, or fatigue  HEENT: negative for vision changes, hearing loss, congestion, rhinorrhea, ST, epistaxis, or sinus pressure Cardiovascular: negative for chest pain or palpitations Respiratory: negative for hemoptysis, wheezing, shortness of breath, or cough Abdominal: negative for abdominal pain, nausea, vomiting, diarrhea, or constipation Dermatological: negative for rash Neurologic: negative for headache, dizziness, or syncope All other systems reviewed and are  otherwise negative with the exception to those above and in the HPI.   Physical Exam: Triage Vitals: Blood pressure 151/77, pulse 74, temperature 97.4 F (36.3 C), resp. rate 16, height 5' 7.5" (1.715 m), weight 244 lb (110.678 kg), SpO2 95.00%., Body mass index is 37.63 kg/(m^2). General: Well developed, well nourished, in no acute distress. Head: Normocephalic, atraumatic, eyes without discharge, sclera non-icteric, nares are without discharge. Bilateral auditory canals clear, TM's are without perforation, pearly grey and translucent with reflective cone of light bilaterally. Oral cavity moist, posterior pharynx without exudate, erythema, peritonsillar abscess, or post nasal drip.  Neck: Supple. No thyromegaly. Full ROM. No lymphadenopathy.  Faint bruit bilaterally Lungs: Clear bilaterally to auscultation without wheezes, rales, or rhonchi. Breathing is unlabored. Heart: RRR with S1 S2. No murmurs, rubs, or gallops appreciated. Abdomen: Soft, non-tender, non-distended with normoactive bowel sounds. No hepatomegaly. No rebound/guarding. No obvious abdominal masses. Msk:  Strength and tone normal for age. Extremities/Skin: Warm and dry. No clubbing or cyanosis. No edema. No rashes or suspicious lesions.   Good feeling in feet Neuro: Alert and oriented X 3. Moves all extremities spontaneously. Gait is normal. CNII-XII grossly in tact. Psych:  Responds to questions appropriately with a normal affect.    ASSESSMENT AND PLAN:  68 y.o. year old male with Type II or unspecified type diabetes mellitus without mention of complication, not stated as uncontrolled - Plan: [facility] Diabetes Foot Exam  Need for prophylactic vaccination and inoculation against influenza - Plan: Flu Vaccine QUAD 36+ mos IM  Need for prophylactic vaccination against Streptococcus pneumoniae (pneumococcus) - Plan: Pneumococcal conjugate vaccine 13-valent IM  Type I (juvenile type) diabetes mellitus with peripheral circulatory  disorders, not stated as uncontrolled(250.71) - Plan: glyBURIDE (DIABETA) 5 MG tablet  Essential hypertension - Plan: metoprolol succinate (TOPROL-XL) 100 MG 24 hr tablet, losartan (COZAAR) 100 MG tablet    I personally performed the services described in this documentation, which was scribed in my presence. The recorded information has been reviewed and is accurate.  Signed, Robyn Haber, MD 07/19/2014 9:46 AM

## 2014-08-08 ENCOUNTER — Ambulatory Visit (INDEPENDENT_AMBULATORY_CARE_PROVIDER_SITE_OTHER): Payer: Medicare PPO | Admitting: Family Medicine

## 2014-08-08 ENCOUNTER — Ambulatory Visit
Admission: RE | Admit: 2014-08-08 | Discharge: 2014-08-08 | Disposition: A | Discharge status: Home / Self Care | Payer: Medicare PPO | Source: Ambulatory Visit | Attending: Family Medicine | Admitting: Family Medicine

## 2014-08-08 VITALS — BP 134/90 | HR 84 | Temp 98.1°F | Resp 16 | Ht 67.75 in | Wt 240.2 lb

## 2014-08-08 DIAGNOSIS — R109 Unspecified abdominal pain: Secondary | ICD-10-CM

## 2014-08-08 DIAGNOSIS — R319 Hematuria, unspecified: Secondary | ICD-10-CM

## 2014-08-08 DIAGNOSIS — E1121 Type 2 diabetes mellitus with diabetic nephropathy: Secondary | ICD-10-CM

## 2014-08-08 DIAGNOSIS — N200 Calculus of kidney: Secondary | ICD-10-CM

## 2014-08-08 DIAGNOSIS — N10 Acute tubulo-interstitial nephritis: Secondary | ICD-10-CM

## 2014-08-08 LAB — POCT UA - MICROSCOPIC ONLY
Casts, Ur, LPF, POC: NEGATIVE
Crystals, Ur, HPF, POC: NEGATIVE
MUCUS UA: NEGATIVE
WBC, Ur, HPF, POC: NEGATIVE
Yeast, UA: NEGATIVE

## 2014-08-08 LAB — COMPREHENSIVE METABOLIC PANEL
ALT: 19 U/L (ref 0–53)
AST: 25 U/L (ref 0–37)
Albumin: 4.7 g/dL (ref 3.5–5.2)
Alkaline Phosphatase: 50 U/L (ref 39–117)
BILIRUBIN TOTAL: 1.2 mg/dL (ref 0.2–1.2)
BUN: 39 mg/dL — ABNORMAL HIGH (ref 6–23)
CO2: 21 mEq/L (ref 19–32)
Calcium: 9.8 mg/dL (ref 8.4–10.5)
Chloride: 100 mEq/L (ref 96–112)
Creat: 1.95 mg/dL — ABNORMAL HIGH (ref 0.50–1.35)
Glucose, Bld: 248 mg/dL — ABNORMAL HIGH (ref 70–99)
Potassium: 4.4 mEq/L (ref 3.5–5.3)
SODIUM: 136 meq/L (ref 135–145)
TOTAL PROTEIN: 8.4 g/dL — AB (ref 6.0–8.3)

## 2014-08-08 LAB — POCT CBC
Granulocyte percent: 82.6 %G — AB (ref 37–80)
HCT, POC: 45.6 % (ref 43.5–53.7)
Hemoglobin: 15 g/dL (ref 14.1–18.1)
LYMPH, POC: 2.1 (ref 0.6–3.4)
MCH, POC: 29.1 pg (ref 27–31.2)
MCHC: 32.9 g/dL (ref 31.8–35.4)
MCV: 88.5 fL (ref 80–97)
MID (cbc): 0.6 (ref 0–0.9)
MPV: 9.6 fL (ref 0–99.8)
POC GRANULOCYTE: 12.9 — AB (ref 2–6.9)
POC LYMPH %: 13.4 % (ref 10–50)
POC MID %: 4 % (ref 0–12)
Platelet Count, POC: 118 10*3/uL — AB (ref 142–424)
RBC: 5.16 M/uL (ref 4.69–6.13)
RDW, POC: 13.7 %
WBC: 15.6 10*3/uL — AB (ref 4.6–10.2)

## 2014-08-08 LAB — POCT URINALYSIS DIPSTICK
BILIRUBIN UA: NEGATIVE
Glucose, UA: NEGATIVE
Ketones, UA: NEGATIVE
Leukocytes, UA: NEGATIVE
Nitrite, UA: NEGATIVE
Protein, UA: 100
Spec Grav, UA: >=1.03
Urobilinogen, UA: 0.2
pH, UA: 5

## 2014-08-08 LAB — GLUCOSE, POCT (MANUAL RESULT ENTRY): POC Glucose: 247 mg/dl — AB (ref 70–99)

## 2014-08-08 MED ORDER — HYDROCODONE-ACETAMINOPHEN 5-325 MG PO TABS: 1.0000 | ORAL_TABLET | Freq: Three times a day (TID) | ORAL | Status: DC | PRN

## 2014-08-08 MED ORDER — TAMSULOSIN HCL 0.4 MG PO CAPS: 0.4000 mg | ORAL_CAPSULE | Freq: Every day | ORAL | Status: DC

## 2014-08-08 MED ORDER — SULFAMETHOXAZOLE-TMP DS 800-160 MG PO TABS: 1.0000 | ORAL_TABLET | Freq: Two times a day (BID) | ORAL | Status: DC

## 2014-08-08 NOTE — Progress Notes (Addendum)
[facility] [address], [place] [address] [phone]  Date:  08/08/2014   Name:  Timothy Martin   DOB:  06-Sep-1946   MRN:  [id number]  PCP:  PROVIDER NOT IN SYSTEM    Chief Complaint: Abdominal Pain and Urinary Frequency   History of Present Illness:  Timothy Martin is a 68 y.o. very pleasant male patient who presents with the following:  He noted onset of left sided abdominal pain last night around 6pm; it then went around to his left flank.   He has never had a kidney stone.  He has not tried any medications as of yet.   He vomited once last night, but was able to drink fluids this am.  Did not try to eat.   He has not noted any hematuria, but did have urinary frequency.  No dysuria.  The pain is coming and going.   It is not that terrible when it does occur He has a history of DM, HTN, congenital nystagmus and obesity   Patient Active Problem List   Diagnosis Date Noted  . Obesity (BMI 30-39.9) 08/03/2013  . Hypertension 03/29/2013  . Type 2 diabetes mellitus 03/29/2013  . Elevated cholesterol 03/29/2013  . 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 Diabetes mellitus   . Hypertension     History reviewed. No pertinent past surgical history.  History  Substance Use Topics  . Smoking status: Never Smoker   . Smokeless tobacco: Not on file  . Alcohol Use: No    Family History  Problem Relation Age of Onset  . Cancer Mother   . Cancer Father     Allergies  Allergen Reactions  . Codeine Other (See Comments)    CHILDHOOD  . Augmentin [Amoxicillin-Pot Clavulanate] Rash    Medication list has been reviewed and updated.  Current Outpatient Prescriptions on File Prior to Visit  Medication Sig Dispense Refill  . Alcohol Swabs PADS USE TO CHECK BLOOD SUGAR DAILY. DX CODE: 250.00  100 each  3  . Blood Glucose Monitoring Suppl (BLOOD GLUCOSE METER) kit Use to test blood sugar daily. Dx code: 250.00   1 each  0  . fish oil-omega-3 fatty acids 1000 MG capsule Take 1 g by mouth 2 (two) times daily after a meal.      . glucose blood test strip Use to check blood sugar daily. Dx code: 250.00  100 each  3  . glyBURIDE (DIABETA) 5 MG tablet Take 1 tablet (5 mg total) by mouth daily with breakfast.  90 tablet  3  . hydrochlorothiazide (HYDRODIURIL) 12.5 MG tablet Take 1 tablet (12.5 mg total) by mouth daily.  90 tablet  3  . Lancets MISC Use to test blood sugar daily. Dx code:250.00  100 each  3  . losartan (COZAAR) 100 MG tablet Take 1 tablet (100 mg total) by mouth daily. NEED REFILLS  90 tablet  3  . metoprolol succinate (TOPROL-XL) 100 MG 24 hr tablet Take 1 tablet (100 mg total) by mouth daily. Take with or immediately following a meal.  90 tablet  3  . simvastatin (ZOCOR) 40 MG tablet Take 0.5 tablets (20 mg total) by mouth daily at 6 PM.  45 tablet  3   No current facility-administered medications on file prior to visit.    Review of Systems:  As per HPI- otherwise negative.   Physical Examination: Filed Vitals:   08/08/14 8882  BP: 134/90  Pulse: 84  Temp: 98.1 F (36.7 C)  Resp: 16   Filed Vitals:   08/08/14 0832  Height: 5' 7.75" (1.721 m)  Weight: 240 lb 3.2 oz (108.954 kg)   Body mass index is 36.79 kg/(m^2). Ideal Body Weight: Weight in (lb) to have BMI = 25: 162.9  GEN: WDWN, NAD, Non-toxic, A & O x 3, obese, looks well HEENT: Atraumatic, Normocephalic. Neck supple. No masses, No LAD. Ears and Nose: No external deformity. CV: RRR, No M/G/R. No JVD. No thrill. No extra heart sounds. PULM: CTA B, no wheezes, crackles, rhonchi. No retractions. No resp. distress. No accessory muscle use. ABD: S, NT, ND, +BS. No rebound. No HSM.  Benign exam at this time.  Mild left flank tenderness  EXTR: No c/c/e NEURO Normal gait.  PSYCH: Normally interactive. Conversant. Not depressed or anxious appearing.  Calm demeanor.   Results for orders placed in visit on 07/19/14  POCT  GLYCOSYLATED HEMOGLOBIN (HGB A1C)      Result Value Ref Range   Hemoglobin A1C 7.1     Results for orders placed in visit on 08/08/14  COMPREHENSIVE METABOLIC PANEL      Result Value Ref Range   Sodium 136  135 - 145 mEq/L   Potassium 4.4  3.5 - 5.3 mEq/L   Chloride 100  96 - 112 mEq/L   CO2 21  19 - 32 mEq/L   Glucose, Bld 248 (*) 70 - 99 mg/dL   BUN 39 (*) 6 - 23 mg/dL   Creat 1.95 (*) 0.50 - 1.35 mg/dL   Total Bilirubin 1.2  0.2 - 1.2 mg/dL   Alkaline Phosphatase 50  39 - 117 U/L   AST 25  0 - 37 U/L   ALT 19  0 - 53 U/L   Total Protein 8.4 (*) 6.0 - 8.3 g/dL   Albumin 4.7  3.5 - 5.2 g/dL   Calcium 9.8  8.4 - 10.5 mg/dL  POCT UA - MICROSCOPIC ONLY      Result Value Ref Range   WBC, Ur, HPF, POC neg     RBC, urine, microscopic 25-30     Bacteria, U Microscopic trace     Mucus, UA neg     Epithelial cells, urine per micros 0-1     Crystals, Ur, HPF, POC neg     Casts, Ur, LPF, POC neg     Yeast, UA neg    POCT URINALYSIS DIPSTICK      Result Value Ref Range   Color, UA dark yellow     Clarity, UA hazy     Glucose, UA neg     Bilirubin, UA neg     Ketones, UA neg     Spec Grav, UA >=1.030     Blood, UA large     pH, UA 5.0     Protein, UA 100     Urobilinogen, UA 0.2     Nitrite, UA neg     Leukocytes, UA Negative    POCT CBC      Result Value Ref Range   WBC 15.6 (*) 4.6 - 10.2 K/uL   Lymph, poc 2.1  0.6 - 3.4   POC LYMPH PERCENT 13.4  10 - 50 %L   MID (cbc) 0.6  0 - 0.9   POC MID % 4.0  0 - 12 %M   POC Granulocyte 12.9 (*) 2 - 6.9   Granulocyte percent 82.6 (*) 37 - 80 %G  RBC 5.16  4.69 - 6.13 M/uL   Hemoglobin 15.0  14.1 - 18.1 g/dL   HCT, POC 45.6  43.5 - 53.7 %   MCV 88.5  80 - 97 fL   MCH, POC 29.1  27 - 31.2 pg   MCHC 32.9  31.8 - 35.4 g/dL   RDW, POC 13.7     Platelet Count, POC 118 (*) 142 - 424 K/uL   MPV 9.6  0 - 99.8 fL  GLUCOSE, POCT (MANUAL RESULT ENTRY)      Result Value Ref Range   POC Glucose 247 (*) 70 - 99 mg/dl        Assessment and Plan: Left flank pain - Plan: POCT UA - Microscopic Only, POCT urinalysis dipstick, Urine culture, CT Abdomen Pelvis Wo Contrast, tamsulosin (FLOMAX) 0.4 MG CAPS capsule, HYDROcodone-acetaminophen (NORCO/VICODIN) 5-325 MG per tablet  Hematuria - Plan: POCT CBC, Comprehensive metabolic panel, CT Abdomen Pelvis Wo Contrast  Type 2 diabetes mellitus with diabetic nephropathy - Plan: POCT glucose (manual entry)  Likely kidney stone- send for a CT scan today and will call him with results. 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  He is not aware of taking any other pain medications since  Signed Lamar Blinks, MD  CT ABDOMEN AND PELVIS WITHOUT CONTRAST  TECHNIQUE: Multidetector CT imaging of the abdomen and pelvis was performed following the standard protocol without IV contrast.  COMPARISON: CT scan of November 05, 2005.  FINDINGS: Visualized lung bases appear normal. Mild degenerative disc disease is noted at L4-5 and L5-S1.  No gallstones are noted. No focal abnormality is noted in the liver, spleen or pancreas on these unenhanced images. Adrenal glands appear normal. Right kidney and ureter appear normal. 4.5 cm cyst is seen involving the mid pole of the left kidney. Mild left hydroureteronephrosis with perinephric stranding is noted secondary to 6 x 2 mm calculus in distal left ureter. Mild atherosclerotic calcifications of abdominal aorta are noted without aneurysm formation. The appendix appears normal. There is no evidence of bowel obstruction. Urinary bladder appears normal. No abnormal fluid collection is noted. Mild bilateral inguinal adenopathy is noted which is improved compared to prior exam and most likely inflammatory in origin.  IMPRESSION: Mild left hydroureteronephrosis with perinephric stranding  is noted secondary to 6 x 2 mm calculus in distal left ureter.  Called to let him know he does indeed have a stone.  May be small enough to pass.  Also, he may have some associated pyelonephritis as evidenced by perinephric stranding and leukocytosis.  Will also add septra because he has had a slightly long QTc in the past.  Referral to urology, he has a strainer for his urine. He will let me know if he is not doing ok.  At the time of our phone conversation he noted that his pain was really resolved.   Also discussed his steadily worsening renal function.  He may be acutely worse due to renal congestion.  However I would like for him to recheck in about one month to repeat his renal labs.  If he continues to climb will need to see nephrology.   Meds ordered this encounter  Medications  . vitamin B-12 (CYANOCOBALAMIN) 100 MCG tablet    Sig: Take 100 mcg by mouth daily.  . tamsulosin (FLOMAX) 0.4 MG CAPS capsule  Sig: Take 1 capsule (0.4 mg total) by mouth daily. Use as needed for kidney stone    Dispense:  30 capsule    Refill:  3  . HYDROcodone-acetaminophen (NORCO/VICODIN) 5-325 MG per tablet    Sig: Take 1 tablet by mouth every 8 (eight) hours as needed.    Dispense:  30 tablet    Refill:  0  . sulfamethoxazole-trimethoprim (BACTRIM DS) 800-160 MG per tablet    Sig: Take 1 tablet by mouth 2 (two) times daily.    Dispense:  20 tablet    Refill:  0   Called to check on him 10/8.  He is feeling better- is using hydrocodone but his pain is not bad.  Let him know urine culture is negative but will have him continue abx.  If he does not hear about urology appt in the next day or so please let us know.

## 2014-08-08 NOTE — Patient Instructions (Signed)
After your CT you can go home and I will give you a call when your results come in later today. Use the flomax daily for the next 1-2 weeks, and the main medications as needed.  Use the urine screen to try and catch the stone.  If the stone is larger I will refer you to urology for further evaluation.

## 2014-08-09 LAB — URINE CULTURE
Colony Count: NO GROWTH
ORGANISM ID, BACTERIA: NO GROWTH

## 2014-10-30 ENCOUNTER — Other Ambulatory Visit: Payer: Self-pay | Admitting: Family Medicine

## 2014-11-21 ENCOUNTER — Other Ambulatory Visit: Payer: Self-pay

## 2014-11-21 MED ORDER — GLIMEPIRIDE 4 MG PO TABS: 4.0000 mg | ORAL_TABLET | Freq: Every day | ORAL | Status: DC

## 2014-11-21 NOTE — Telephone Encounter (Signed)
Dr L, I just wanted to double check that this change is OK with you. He was taking Glyburide 5 mg QD and there was not a 5 mg strength of glimepiride. The closest was 4 mg. Pended.

## 2014-11-22 ENCOUNTER — Ambulatory Visit (INDEPENDENT_AMBULATORY_CARE_PROVIDER_SITE_OTHER): Payer: Medicare PPO | Admitting: Family Medicine

## 2014-11-22 ENCOUNTER — Encounter: Payer: Self-pay | Admitting: Family Medicine

## 2014-11-22 VITALS — BP 147/82 | HR 75 | Temp 97.4°F | Resp 16 | Ht 68.0 in | Wt 242.0 lb

## 2014-11-22 DIAGNOSIS — I10 Essential (primary) hypertension: Secondary | ICD-10-CM

## 2014-11-22 DIAGNOSIS — E1121 Type 2 diabetes mellitus with diabetic nephropathy: Secondary | ICD-10-CM

## 2014-11-22 LAB — COMPREHENSIVE METABOLIC PANEL WITH GFR
ALT: 24 U/L (ref 0–53)
AST: 30 U/L (ref 0–37)
Albumin: 4.6 g/dL (ref 3.5–5.2)
Alkaline Phosphatase: 40 U/L (ref 39–117)
BUN: 30 mg/dL — ABNORMAL HIGH (ref 6–23)
CO2: 24 meq/L (ref 19–32)
Calcium: 9.8 mg/dL (ref 8.4–10.5)
Chloride: 104 meq/L (ref 96–112)
Creat: 1.52 mg/dL — ABNORMAL HIGH (ref 0.50–1.35)
Glucose, Bld: 144 mg/dL — ABNORMAL HIGH (ref 70–99)
Potassium: 4.3 meq/L (ref 3.5–5.3)
Sodium: 139 meq/L (ref 135–145)
Total Bilirubin: 0.9 mg/dL (ref 0.2–1.2)
Total Protein: 7.9 g/dL (ref 6.0–8.3)

## 2014-11-22 LAB — POCT GLYCOSYLATED HEMOGLOBIN (HGB A1C): Hemoglobin A1C: 7

## 2014-11-22 MED ORDER — GLIMEPIRIDE 4 MG PO TABS: 4.0000 mg | ORAL_TABLET | Freq: Every day | ORAL | Status: DC

## 2014-11-22 MED ORDER — HYDROCHLOROTHIAZIDE 12.5 MG PO TABS: 12.5000 mg | ORAL_TABLET | Freq: Every day | ORAL | Status: DC

## 2014-11-22 NOTE — Progress Notes (Addendum)
   Subjective:    Patient ID: Timothy Martin, male    DOB: December 09, 1945, 69 y.o.   MRN: [id number] This chart was scribed for Elvina SidleKurt Emmanuell Kantz, MD by Littie Deedsichard Sun, Medical Scribe. This patient was seen in Room 25 and the patient's care was started at 10:06 AM.   HPI HPI Comments: Timothy Martin is a 69 y.o. male who [facility] complaining of recent kidney stone passing.  He works at the [employer] over at [employer].  Left palmar wart:  Slightly smaller No hypoglycemic episodes Blood sugars running well.  Review of Systems No dizziness or vision changes    Objective:   Physical Exam CONSTITUTIONAL: Well developed/well nourished HEAD: Normocephalic/atraumatic EYES: EOM/PERRL; fundi benign ENMT: Mucous membranes moist NECK: supple no meningeal signs SPINE: entire spine nontender CV: S1/S2 noted, no rubs/gallops noted; faint 1/6 systolic murmur LUNGS: Lungs are clear to auscultation bilaterally, no apparent distress ABDOMEN: soft, nontender, no rebound or guarding GU: no cva tenderness NEURO: Pt is awake/alert, moves all extremitiesx4 EXTREMITIES: pulses normal, full ROM SKIN: warm, color normal, skin tags on right cheek.  Blister (dry) plantar forefoot left PSYCH: no abnormalities of mood noted     Assessment & Plan:   This chart was scribed in my presence and reviewed by me personally.    ICD-9-CM ICD-10-CM   1. Type 2 diabetes mellitus with diabetic nephropathy 250.40 E11.21 HM Diabetes Foot Exam   583.81  glimepiride (AMARYL) 4 MG tablet     POCT glycosylated hemoglobin (Hb A1C)     Comprehensive metabolic panel  2. Essential hypertension 401.9 I10 hydrochlorothiazide (HYDRODIURIL) 12.5 MG tablet  skin tags right cheek clipped.  Left the palmar wart alone  Recheck 3 months Signed, Elvina SidleKurt Levent Kornegay, MD  Addendum: Results for orders placed or performed in visit on 11/22/14  Comprehensive metabolic panel  Result Value Ref Range   Sodium 139 135 - 145 mEq/L   Potassium 4.3 3.5 - 5.3 mEq/L   Chloride 104 96 - 112 mEq/L   CO2 24 19 - 32 mEq/L   Glucose, Bld 144 (H) 70 - 99 mg/dL   BUN 30 (H) 6 - 23 mg/dL   Creat 4.091.52 (H) 8.110.50 - 1.35 mg/dL   Total Bilirubin 0.9 0.2 - 1.2 mg/dL   Alkaline Phosphatase 40 39 - 117 U/L   AST 30 0 - 37 U/L   ALT 24 0 - 53 U/L   Total Protein 7.9 6.0 - 8.3 g/dL   Albumin 4.6 3.5 - 5.2 g/dL   Calcium 9.8 8.4 - 91.410.5 mg/dL  POCT glycosylated hemoglobin (Hb A1C)  Result Value Ref Range   Hemoglobin A1C 7

## 2014-11-25 ENCOUNTER — Encounter: Payer: Self-pay | Admitting: *Deleted

## 2014-12-20 ENCOUNTER — Telehealth: Payer: Self-pay | Admitting: Family Medicine

## 2014-12-20 NOTE — Telephone Encounter (Signed)
Patient's wife came to 104 appointment and dropped a form about husband's most recent eye doctor appointment. States that they found blood in his eyes. I am sending this form to 102 via medical records to place in Dr. Loma BostonLauenstein's box for his review when he returns to office. Please call patient's wife Susie for more information.  [phone]

## 2014-12-24 NOTE — Telephone Encounter (Signed)
Form is in Dr. Loma BostonLauenstein's box for review.

## 2015-02-21 ENCOUNTER — Telehealth: Payer: Self-pay | Admitting: Family Medicine

## 2015-02-21 NOTE — Telephone Encounter (Signed)
lmom to call an reschedule his appt on 02/28/15 Dr Milus GlazierLauenstein will be out of office that day

## 2015-02-28 ENCOUNTER — Ambulatory Visit: Payer: Medicare PPO | Admitting: Family Medicine

## 2015-05-04 ENCOUNTER — Emergency Department (HOSPITAL_COMMUNITY): Payer: Medicare HMO

## 2015-05-04 ENCOUNTER — Encounter (HOSPITAL_COMMUNITY): Payer: Self-pay

## 2015-05-04 ENCOUNTER — Emergency Department (HOSPITAL_COMMUNITY)
Admission: EM | Admit: 2015-05-04 | Discharge: 2015-05-04 | Disposition: A | Discharge status: Home / Self Care | Payer: Medicare HMO | Attending: Emergency Medicine | Admitting: Emergency Medicine

## 2015-05-04 ENCOUNTER — Ambulatory Visit (INDEPENDENT_AMBULATORY_CARE_PROVIDER_SITE_OTHER): Payer: Medicare HMO | Admitting: Emergency Medicine

## 2015-05-04 ENCOUNTER — Ambulatory Visit (INDEPENDENT_AMBULATORY_CARE_PROVIDER_SITE_OTHER): Payer: Medicare HMO

## 2015-05-04 ENCOUNTER — Encounter: Payer: Self-pay | Admitting: Emergency Medicine

## 2015-05-04 VITALS — BP 152/94 | HR 65 | Temp 98.0°F | Resp 17 | Ht 67.5 in | Wt 242.0 lb

## 2015-05-04 DIAGNOSIS — I129 Hypertensive chronic kidney disease with stage 1 through stage 4 chronic kidney disease, or unspecified chronic kidney disease: Secondary | ICD-10-CM | POA: Diagnosis not present

## 2015-05-04 DIAGNOSIS — E1121 Type 2 diabetes mellitus with diabetic nephropathy: Secondary | ICD-10-CM | POA: Diagnosis not present

## 2015-05-04 DIAGNOSIS — N200 Calculus of kidney: Secondary | ICD-10-CM

## 2015-05-04 DIAGNOSIS — N189 Chronic kidney disease, unspecified: Secondary | ICD-10-CM | POA: Diagnosis not present

## 2015-05-04 DIAGNOSIS — R319 Hematuria, unspecified: Secondary | ICD-10-CM | POA: Diagnosis not present

## 2015-05-04 DIAGNOSIS — Z79899 Other long term (current) drug therapy: Secondary | ICD-10-CM | POA: Insufficient documentation

## 2015-05-04 DIAGNOSIS — E119 Type 2 diabetes mellitus without complications: Secondary | ICD-10-CM | POA: Diagnosis not present

## 2015-05-04 DIAGNOSIS — R109 Unspecified abdominal pain: Secondary | ICD-10-CM

## 2015-05-04 DIAGNOSIS — R112 Nausea with vomiting, unspecified: Secondary | ICD-10-CM | POA: Diagnosis not present

## 2015-05-04 DIAGNOSIS — N23 Unspecified renal colic: Secondary | ICD-10-CM | POA: Diagnosis not present

## 2015-05-04 LAB — POCT CBC
GRANULOCYTE PERCENT: 35.1 % — AB (ref 37–80)
HEMATOCRIT: 44 % (ref 43.5–53.7)
Hemoglobin: 14.7 g/dL (ref 14.1–18.1)
Lymph, poc: 4.4 — AB (ref 0.6–3.4)
MCH, POC: 29.1 pg (ref 27–31.2)
MCHC: 33.6 g/dL (ref 31.8–35.4)
MCV: 86.6 fL (ref 80–97)
MID (cbc): 1.1 — AB (ref 0–0.9)
MPV: 9 fL (ref 0–99.8)
POC Granulocyte: 7 — AB (ref 2–6.9)
POC LYMPH PERCENT: 35.1 %L (ref 10–50)
POC MID %: 8.9 %M (ref 0–12)
Platelet Count, POC: 140 10*3/uL — AB (ref 142–424)
RBC: 5.07 M/uL (ref 4.69–6.13)
RDW, POC: 13.1 %
WBC: 12.5 10*3/uL — AB (ref 4.6–10.2)

## 2015-05-04 LAB — POCT UA - MICROSCOPIC ONLY
CASTS, UR, LPF, POC: NEGATIVE
Crystals, Ur, HPF, POC: NEGATIVE
Yeast, UA: NEGATIVE

## 2015-05-04 LAB — POCT URINALYSIS DIPSTICK
Bilirubin, UA: NEGATIVE
Glucose, UA: NEGATIVE
Ketones, UA: NEGATIVE
NITRITE UA: NEGATIVE
Protein, UA: 100
SPEC GRAV UA: 1.025
Urobilinogen, UA: 0.2
pH, UA: 5.5

## 2015-05-04 LAB — BASIC METABOLIC PANEL
ANION GAP: 13 (ref 5–15)
BUN: 39 mg/dL — AB (ref 6–20)
CALCIUM: 9.6 mg/dL (ref 8.9–10.3)
CO2: 23 mmol/L (ref 22–32)
Chloride: 102 mmol/L (ref 101–111)
Creatinine, Ser: 1.99 mg/dL — ABNORMAL HIGH (ref 0.61–1.24)
GFR calc Af Amer: 38 mL/min — ABNORMAL LOW (ref >60)
GFR, EST NON AFRICAN AMERICAN: 33 mL/min — AB (ref >60)
Glucose, Bld: 183 mg/dL — ABNORMAL HIGH (ref 65–99)
POTASSIUM: 4.5 mmol/L (ref 3.5–5.1)
SODIUM: 138 mmol/L (ref 135–145)

## 2015-05-04 LAB — GLUCOSE, POCT (MANUAL RESULT ENTRY): POC Glucose: 138 mg/dl — AB (ref 70–99)

## 2015-05-04 MED ORDER — TAMSULOSIN HCL 0.4 MG PO CAPS: 0.4000 mg | ORAL_CAPSULE | Freq: Every day | ORAL | Status: DC

## 2015-05-04 MED ORDER — KETOROLAC TROMETHAMINE 30 MG/ML IJ SOLN
30.0000 mg | Freq: Once | INTRAMUSCULAR | Status: AC
  Administered 2015-05-04: 30 mg via INTRAMUSCULAR

## 2015-05-04 MED ORDER — KETOROLAC TROMETHAMINE 30 MG/ML IJ SOLN: 30.0000 mg | Freq: Once | INTRAMUSCULAR | Status: DC

## 2015-05-04 MED ORDER — ONDANSETRON 4 MG PO TBDP
8.0000 mg | ORAL_TABLET | Freq: Once | ORAL | Status: AC
  Administered 2015-05-04: 8 mg via ORAL

## 2015-05-04 MED ORDER — OXYCODONE-ACETAMINOPHEN 5-325 MG PO TABS: 1.0000 | ORAL_TABLET | Freq: Four times a day (QID) | ORAL | Status: DC | PRN

## 2015-05-04 MED ORDER — ONDANSETRON 4 MG PO TBDP: 4.0000 mg | ORAL_TABLET | Freq: Once | ORAL | Status: DC

## 2015-05-04 MED ORDER — ONDANSETRON HCL 4 MG PO TABS: 4.0000 mg | ORAL_TABLET | Freq: Four times a day (QID) | ORAL | Status: DC

## 2015-05-04 NOTE — ED Provider Notes (Signed)
CSN: [id number]     Arrival date & time 05/04/15  1718 History   First MD Initiated Contact with Patient 05/04/15 1726     Chief Complaint  Patient presents with  . Flank Pain    Patient is a 69 y.o. male presenting with flank pain. The history is provided by the patient. No language interpreter was used.  Flank Pain   Timothy Martin presents for evaluation of right flank pain.  He was seen at urgent care earlier today and referred to the ED for further eval.  He developed sharp, constant, right flank pain around 3-330pm today.  Pain occurred at rest and was constant and nonradiating.  He had associated nausea and vomiting.  No fevers, chest pain, dysuria, abdominal pain.  He had a kidney stone a year ago with similar but less severe pain.  Currently his pain is much improved after receiving medications prior to transfer to ED.  Sxs are moderate, improving.    Past Medical History  Diagnosis Date  . Diabetes mellitus   . Hypertension    History reviewed. No pertinent past surgical history. Family History  Problem Relation Age of Onset  . Cancer Mother   . Cancer Father    History  Substance Use Topics  . Smoking status: Never Smoker   . Smokeless tobacco: Not on file  . Alcohol Use: No    Review of Systems  Genitourinary: Positive for flank pain.  All other systems reviewed and are negative.     Allergies  Codeine and Augmentin  Home Medications   Prior to Admission medications   Medication Sig Start Date End Date Taking? Authorizing Provider  Alcohol Swabs PADS USE TO CHECK BLOOD SUGAR DAILY. DX CODE: 250.00 05/18/13   [name], PA-C  Blood Glucose Monitoring Suppl (BLOOD GLUCOSE METER) kit Use to test blood sugar daily. Dx code: 250.00 05/18/13   [name], PA-C  fish oil-omega-3 fatty acids 1000 MG capsule Take 1 g by mouth 2 (two) times daily after a meal.    Historical Provider, MD  glimepiride (AMARYL) 4 MG tablet Take 1 tablet (4 mg total) by mouth daily  with breakfast. 11/22/14   Robyn Haber, MD  glucose blood test strip Use to check blood sugar daily. Dx code: 250.00 05/18/13   [name], PA-C  glyBURIDE (DIABETA) 5 MG tablet Take 1 tablet (5 mg total) by mouth daily with breakfast. 07/19/14   Robyn Haber, MD  hydrochlorothiazide (HYDRODIURIL) 12.5 MG tablet Take 1 tablet (12.5 mg total) by mouth daily. 11/22/14   Robyn Haber, MD  HYDROcodone-acetaminophen (NORCO/VICODIN) 5-325 MG per tablet Take 1 tablet by mouth every 8 (eight) hours as needed. 08/08/14   Darreld Mclean, MD  Lancets MISC Use to test blood sugar daily. Dx code:250.00 05/18/13   [name], PA-C  losartan (COZAAR) 100 MG tablet Take 1 tablet (100 mg total) by mouth daily. NEED REFILLS 07/19/14   Robyn Haber, MD  metoprolol succinate (TOPROL-XL) 100 MG 24 hr tablet Take 1 tablet (100 mg total) by mouth daily. Take with or immediately following a meal. Patient not taking: Reported on 05/04/2015 07/19/14   Robyn Haber, MD  simvastatin (ZOCOR) 40 MG tablet Take 0.5 tablets (20 mg total) by mouth daily at 6 PM. Patient not taking: Reported on 05/04/2015 07/19/14   Robyn Haber, MD  vitamin B-12 (CYANOCOBALAMIN) 100 MCG tablet Take 100 mcg by mouth daily.    Historical Provider, MD   BP  157/74 mmHg  Pulse 63  Temp(Src) 97.4 F (36.3 C) (Oral)  Resp 18  SpO2 98% Physical Exam  Constitutional: He is oriented to person, place, and time. He appears well-developed and well-nourished.  HENT:  Head: Normocephalic and atraumatic.  Cardiovascular: Normal rate and regular rhythm.   SEM  Pulmonary/Chest: Effort normal and breath sounds normal. No respiratory distress.  Abdominal: Soft. There is no tenderness. There is no rebound and no guarding.  No CVA tenderness  Musculoskeletal: He exhibits no edema or tenderness.  Neurological: He is alert and oriented to person, place, and time.  Skin: Skin is warm and dry.  Psychiatric: He has a normal mood and affect.  His behavior is normal.  Nursing note and vitals reviewed.   ED Course  Procedures (including critical care time) Labs Review Labs Reviewed  BASIC METABOLIC PANEL - Abnormal; Notable for the following:    Glucose, Bld 183 (*)    BUN 39 (*)    Creatinine, Ser 1.99 (*)    GFR calc non Af Amer 33 (*)    GFR calc Af Amer 38 (*)    All other components within normal limits    Imaging Review Dg Abd 1 View  05/04/2015   CLINICAL DATA:  Abdominal pain with nausea and vomiting. History of kidney stones. Flank pain.  EXAM: ABDOMEN - 1 VIEW  COMPARISON:  CT scan of the abdomen dated 08/08/2014  FINDINGS: The bowel gas pattern is normal. Small round faint density adjacent to the right transverse process of L3, 8 mm in diameter, most likely represents some thin the bowel on the patient's clothing. The density and size and shape are not typical for ureteral stones and/or no stones in the kidneys on the prior CT scan although the was a distal left ureteral stone on the prior scan.  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 Prominent osteophytes at the T11-12 level.  IMPRESSION: Benign appearing abdomen.  No definitive ureteral or renal calculi.   Electronically Signed   By: Lorriane Shire M.D.   On: 05/04/2015 16:56     EKG Interpretation None      MDM   Final diagnoses:  Renal colic on right side  Chronic renal insufficiency, unspecified stage    Patient here for evaluation of right flank pain. His pain is essentially resolved on evaluation in the emergency department after receiving Toradol prior to ED arrival. CT demonstrates stone in the right ureter with mild Hydro. Repeat urinalysis from urgent care, UA is not consistent with infection and patient does not have any infectious symptoms, culture is pending at urgent care. BMP demonstrates renal insufficiency with creatinine to 1.99 that this is similar to priors. Discussed with patient findings of kidney stone and recommend close outpatient  follow-up and return precautions were discussed. Discussed findings of renal insufficiency and avoiding nephrotoxic drugs.    Quintella Reichert, MD 05/04/15 [phone]

## 2015-05-04 NOTE — Discharge Instructions (Signed)
You have a 6x9 mm kidney stone on the right side.    Kidney Stones Kidney stones (urolithiasis) are deposits that form inside your kidneys. The intense pain is caused by the stone moving through the urinary tract. When the stone moves, the ureter goes into spasm around the stone. The stone is usually passed in the urine.  CAUSES   A disorder that makes certain neck glands produce too much parathyroid hormone (primary hyperparathyroidism).  A buildup of uric acid crystals, similar to gout in your joints.  Narrowing (stricture) of the ureter.  A kidney obstruction present at birth (congenital obstruction).  Previous surgery on the kidney or ureters.  Numerous kidney infections. SYMPTOMS   Feeling sick to your stomach (nauseous).  Throwing up (vomiting).  Blood in the urine (hematuria).  Pain that usually spreads (radiates) to the groin.  Frequency or urgency of urination. DIAGNOSIS   Taking a history and physical exam.  Blood or urine tests.  CT scan.  Occasionally, an examination of the inside of the urinary bladder (cystoscopy) is performed. TREATMENT   Observation.  Increasing your fluid intake.  Extracorporeal shock wave lithotripsy--This is a noninvasive procedure that uses shock waves to break up kidney stones.  Surgery may be needed if you have severe pain or persistent obstruction. There are various surgical procedures. Most of the procedures are performed with the use of small instruments. Only small incisions are needed to accommodate these instruments, so recovery time is minimized. The size, location, and chemical composition are all important variables that will determine the proper choice of action for you. Talk to your health care provider to better understand your situation so that you will minimize the risk of injury to yourself and your kidney.  HOME CARE INSTRUCTIONS   Drink enough water and fluids to keep your urine clear or pale yellow. This will  help you to pass the stone or stone fragments.  Strain all urine through the provided strainer. Keep all particulate matter and stones for your health care provider to see. The stone causing the pain may be as small as a grain of salt. It is very important to use the strainer each and every time you pass your urine. The collection of your stone will allow your health care provider to analyze it and verify that a stone has actually passed. The stone analysis will often identify what you can do to reduce the incidence of recurrences.  Only take over-the-counter or prescription medicines for pain, discomfort, or fever as directed by your health care provider.  Make a follow-up appointment with your health care provider as directed.  Get follow-up X-rays if required. The absence of pain does not always mean that the stone has passed. It may have only stopped moving. If the urine remains completely obstructed, it can cause loss of kidney function or even complete destruction of the kidney. It is your responsibility to make sure X-rays and follow-ups are completed. Ultrasounds of the kidney can show blockages and the status of the kidney. Ultrasounds are not associated with any radiation and can be performed easily in a matter of minutes. SEEK MEDICAL CARE IF:  You experience pain that is progressive and unresponsive to any pain medicine you have been prescribed. SEEK IMMEDIATE MEDICAL CARE IF:   Pain cannot be controlled with the prescribed medicine.  You have a fever or shaking chills.  The severity or intensity of pain increases over 18 hours and is not relieved by pain medicine.  You  develop a new onset of abdominal pain.  You feel faint or pass out.  You are unable to urinate. MAKE SURE YOU:   Understand these instructions.  Will watch your condition.  Will get help right away if you are not doing well or get worse. Document Released: 10/19/2005 Document Revised: 06/21/2013 Document  Reviewed: 03/22/2013 [facility] Patient Information 2015 [place], [place]. This information is not intended to replace advice given to you by your health care provider. Make sure you discuss any questions you have with your health care provider.

## 2015-05-04 NOTE — Progress Notes (Addendum)
Subjective:  This chart was scribed for Earl LitesSteve Kylie Gros, MD by [facility] Martin, medical scribe at [facility].The patient was seen in exam room 08 and the patient's care was started at 4:10 PM.   Patient ID: Timothy Martin, male    DOB: 1946/08/20, 69 y.o.   MRN: [id number] Chief Complaint  Patient presents with  . Nausea  . Emesis  . Back Pain   HPI HPI Comments: Timothy Martin is a 69 y.o. male who presents to [facility] complaining of nausea, emesis, and RLQ abdominal pain, right sided flank pain rated 6/10, acute onset 1 hour pta. He describes the pain as a sharp stinging cramp. He has not taken anything for relief. History of kidney stones, last kidney stone was on the left side. He was never admitted to the hospital due to kidney stones. History of diabetes, blood sugar is 160.  Review of Systems  Gastrointestinal: Positive for nausea, vomiting and abdominal pain.  Genitourinary: Positive for flank pain.      Objective:  BP 152/94 mmHg  Pulse 65  Temp(Src) 98 F (36.7 C) (Oral)  Resp 17  Ht 5' 7.5" (1.715 m)  Wt 242 lb (109.77 kg)  BMI 37.32 kg/m2  SpO2 99% Physical Exam  Vitals reviewed. CONSTITUTIONAL: Well developed/well nourished. Pale appearing and diaphoretic.  HEAD: Normocephalic/atraumatic EYES: EOMI/PERRL ENMT: Mucous membranes moist NECK: supple no meningeal signs SPINE/BACK:entire spine nontender CV: S1/S2 noted, no murmurs/rubs/gallops noted LUNGS: Lungs are clear to auscultation bilaterally, no apparent distress ABDOMEN: soft, no rebound or guarding, bowel sounds noted throughout abdomen. Mild tenderness right lower quadrant. GU: Mild tenderness right flank NEURO: Pt is awake/alert/appropriate, moves all extremitiesx4.  No facial droop.   EXTREMITIES: pulses normal/equal, full ROM SKIN: warm, color normal PSYCH: no abnormalities of mood noted, alert and oriented to situation.   [facility] reading (PRIMARY) by  Dr Cleta Albertsaub  there is a questionable density adjacent to the L3 vertebrae on the right please comment. Results for orders placed or performed in visit on 05/04/15  POCT urinalysis dipstick  Result Value Ref Range   Color, UA dark amber    Clarity, UA cloudy    Glucose, UA neg    Bilirubin, UA neg    Ketones, UA neg    Spec Grav, UA 1.025    Blood, UA large    pH, UA 5.5    Protein, UA 100    Urobilinogen, UA 0.2    Nitrite, UA neg    Leukocytes, UA small (1+) (A) Negative  POCT UA - Microscopic Only  Result Value Ref Range   WBC, Ur, HPF, POC 5-8    RBC, urine, microscopic TNTC    Bacteria, U Microscopic small    Mucus, UA small    Epithelial cells, urine per micros 2-4    Crystals, Ur, HPF, POC neg    Casts, Ur, LPF, POC neg    Yeast, UA neg   POCT CBC  Result Value Ref Range   WBC 12.5 (A) 4.6 - 10.2 K/uL   Lymph, poc 4.4 (A) 0.6 - 3.4   POC LYMPH PERCENT 35.1 10 - 50 %L   MID (cbc) 1.1 (A) 0 - 0.9   POC MID % 8.9 0 - 12 %M   POC Granulocyte 7.0 (A) 2 - 6.9   Granulocyte percent 35.1 (A) 37 - 80 %G   RBC 5.07 4.69 - 6.13 M/uL   Hemoglobin 14.7 14.1 - 18.1 g/dL  HCT, POC 44.0 43.5 - 53.7 %   MCV 86.6 80 - 97 fL   MCH, POC 29.1 27 - 31.2 pg   MCHC 33.6 31.8 - 35.4 g/dL   RDW, POC 19.1 %   Platelet Count, POC 140 (A) 142 - 424 K/uL   MPV 9.0 0 - 99.8 fL  POCT glucose (manual entry)  Result Value Ref Range   POC Glucose 138 (A) 70 - 99 mg/dl   Assessment & Plan:   Patient presents with right flank pain with nausea vomiting and hematuria.He did improve but still had 3 out of 10 pain.Patient sent to the ER for evaluation for imaging. Triage called.I personally performed the services described in this documentation, which was scribed in my presence. The recorded information has been reviewed and is accurate.KUB neg.  Earl Lites, MD

## 2015-05-04 NOTE — ED Notes (Signed)
Pt presents with c/o right side flank pain that started approx 2 hours ago. Pt reports a hx of kidney stone. Pt went to UC earlier today and was referred over here to find out if he has a kidney stone. Pt reports some blood in his urine.

## 2015-05-04 NOTE — ED Notes (Signed)
Nurse currently starting IV 

## 2015-05-06 LAB — URINE CULTURE
COLONY COUNT: NO GROWTH
Organism ID, Bacteria: NO GROWTH

## 2015-05-20 ENCOUNTER — Ambulatory Visit (INDEPENDENT_AMBULATORY_CARE_PROVIDER_SITE_OTHER): Payer: Medicare HMO | Admitting: Family Medicine

## 2015-05-20 ENCOUNTER — Encounter: Payer: Self-pay | Admitting: Family Medicine

## 2015-05-20 VITALS — BP 183/67 | HR 78 | Temp 97.8°F | Resp 16 | Ht 68.0 in | Wt 235.2 lb

## 2015-05-20 DIAGNOSIS — I10 Essential (primary) hypertension: Secondary | ICD-10-CM

## 2015-05-20 DIAGNOSIS — R748 Abnormal levels of other serum enzymes: Secondary | ICD-10-CM | POA: Diagnosis not present

## 2015-05-20 DIAGNOSIS — N189 Chronic kidney disease, unspecified: Secondary | ICD-10-CM

## 2015-05-20 DIAGNOSIS — E1122 Type 2 diabetes mellitus with diabetic chronic kidney disease: Secondary | ICD-10-CM

## 2015-05-20 DIAGNOSIS — R7989 Other specified abnormal findings of blood chemistry: Secondary | ICD-10-CM

## 2015-05-20 DIAGNOSIS — N200 Calculus of kidney: Secondary | ICD-10-CM

## 2015-05-20 LAB — POCT URINALYSIS DIPSTICK
Bilirubin, UA: NEGATIVE
GLUCOSE UA: NEGATIVE
Ketones, UA: NEGATIVE
LEUKOCYTES UA: NEGATIVE
Nitrite, UA: NEGATIVE
Protein, UA: NEGATIVE
Spec Grav, UA: 1.015
Urobilinogen, UA: 0.2
pH, UA: 5.5

## 2015-05-20 LAB — GLUCOSE, POCT (MANUAL RESULT ENTRY): POC GLUCOSE: 73 mg/dL (ref 70–99)

## 2015-05-20 LAB — POCT GLYCOSYLATED HEMOGLOBIN (HGB A1C): HEMOGLOBIN A1C: 7.3

## 2015-05-20 NOTE — Patient Instructions (Addendum)
For your blood pressure - increase to two of the hydrocholothiazide (total dose of 25 mg) each day. Keep a record of your blood pressures outside of the office and bring them to the next office visit. If not below 140/90 in next 10 days - recheck to look at other medicine options.  I will refer you to a urologist for your kidney stone this week. Ok to continue the flomax, tylenol if needed and if needed for more severe pain - 1/2 of hydrocodone (as long as it is not causing same symptoms).  I am also checking your kidney function.  Return to the clinic or go to the nearest emergency room if any of your symptoms worsen or new symptoms occur.  Plan on fasting labs at next diabetes visit so we can check your cholesterol.   Your diabetes is not quite at goal of less than 7, but close at 7.3.  We can continue the same doses of medicines for now, but watch your diet and we can recheck levels in 3 months.

## 2015-05-20 NOTE — Progress Notes (Addendum)
Subjective:    Patient ID: Timothy Martin, male    DOB: 01-28-1946, 69 y.o.   MRN: [id number] This chart was scribed for Meredith Staggers, MD by Littie Deeds, Medical Scribe. This patient was seen in Room 27 and the patient's care was started at 5:15 PM.    HPI HPI Comments: Timothy Martin is a 69 y.o. male with a history of DM and hypertension who presents to the [facility] for a follow-up. Patient is new to me, but not new to the practice.  Diabetes: He has been getting blood sugar readings in the 130-140 range. No symptomatic lows. He takes Zocor 20 mg QD. He is not fasting for today's visit. Plan on checking lipids next visit. Lab Results  Component Value Date   HGBA1C 7 11/22/2014   Lab Results  Component Value Date   CHOL 108 03/15/2014   HDL 31* 03/15/2014   LDLCALC 60 03/15/2014   TRIG 87 03/15/2014   CHOLHDL 3.5 03/15/2014      Hypertension: With history of renal insufficiency/chronic kidney disease. Recently seen in the ED July 2nd. Creatinine 1.99 at that time. When compared to previous labs, range of 1.5-1.95. In review of prior visits, last seen by Dr. Milus Glazier in January with elevated blood pressure of 147/82. Patient states his blood pressure has not been this high before (183/67). He has been getting readings between 130-150 systolic at home. Patient denies missing any doses of his blood pressure medications. He denies headache, chest pain and SOB.  Kidney Stone: He was seen in the ED on July 2nd for a right-sided kidney stone. He was noted to have a 6 mm x 9 mm stone with mild right-sided hydronephrosis. He did have nausea and vomiting the first day. Patient was given prescriptions for Flomax, hydrocodone and Zofran; however, he never filled the Zofran. He had dizziness with the hydrocodone so he began taking extra strength Tylenol instead. He has been taken the Flomax. Patient has not passed the stone yet and still has intermittent pain, worse at  night. He has not had any difficulty with urination. He has had one kidney stone prior to this one.  Ophthalmologist: He last saw his ophthalmologist 4 months ago at [facility].  Health Maintenance: Shingles vaccine cost-prohibitive.  Patient Active Problem List   Diagnosis Date Noted  . Obesity (BMI 30-39.9) 08/03/2013  . Hypertension 03/29/2013  . Type 2 diabetes mellitus 03/29/2013  . Elevated cholesterol 03/29/2013  . 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 Diabetes mellitus   . Hypertension    No past surgical history on file. Allergies  Allergen Reactions  . Codeine Other (See Comments)    CHILDHOOD  . Augmentin [Amoxicillin-Pot Clavulanate] Rash   Prior to Admission medications   Medication Sig Start Date End Date Taking? Authorizing Provider  Alcohol Swabs PADS USE TO CHECK BLOOD SUGAR DAILY. DX CODE: 250.00 05/18/13  Yes Heather Jaquita Rector, PA-C  Blood Glucose Monitoring Suppl (BLOOD GLUCOSE METER) kit Use to test blood sugar daily. Dx code: 250.00 05/18/13  Yes Heather Jaquita Rector, PA-C  fish oil-omega-3 fatty acids 1000 MG capsule Take 1 g by mouth 2 (two) times daily after a meal.   Yes Historical Provider, MD  glimepiride (AMARYL) 4 MG tablet Take 1 tablet (4 mg total) by mouth daily with breakfast. 11/22/14  Yes Elvina Sidle, MD  glucose blood test strip Use to check blood sugar daily. Dx code: 250.00 05/18/13  Yes [name], PA-C  hydrochlorothiazide (HYDRODIURIL) 12.5 MG tablet Take 1 tablet (12.5 mg total) by mouth daily. 11/22/14  Yes Robyn Haber, MD  Lancets MISC Use to test blood sugar daily. Dx code:250.00 05/18/13  Yes Heather M [name], PA-C  losartan (COZAAR) 100 MG tablet Take 1 tablet (100 mg total) by mouth daily. NEED REFILLS 07/19/14  Yes Robyn Haber, MD  metoprolol succinate (TOPROL-XL) 100 MG 24 hr tablet Take 1 tablet (100 mg total) by mouth daily. Take with or immediately following a meal. 07/19/14  Yes Robyn Haber, MD  simvastatin (ZOCOR) 40 MG tablet Take 0.5 tablets (20 mg total) by mouth daily at 6 PM. 07/19/14  Yes Robyn Haber, MD  tamsulosin (FLOMAX) 0.4 MG CAPS capsule Take 1 capsule (0.4 mg total) by mouth daily. 05/04/15  Yes Quintella Reichert, MD  vitamin B-12 (CYANOCOBALAMIN) 100 MCG tablet Take 100 mcg by mouth daily.   Yes Historical Provider, MD  glyBURIDE (DIABETA) 5 MG tablet Take 1 tablet (5 mg total) by mouth daily with breakfast. Patient not taking: Reported on 05/20/2015 07/19/14   Robyn Haber, MD  ondansetron (ZOFRAN) 4 MG tablet Take 1 tablet (4 mg total) by mouth every 6 (six) hours. Patient not taking: Reported on 05/20/2015 05/04/15   Quintella Reichert, MD   History   Social History  . Marital Status: Married    Spouse Name: N/A  . Number of Children: N/A  . Years of Education: N/A   Occupational History  . Not on file.   Social History Main Topics  . Smoking status: Never Smoker   . Smokeless tobacco: Not on file  . Alcohol Use: No  . Drug Use: No  . Sexual Activity: Not on file   Other Topics Concern  . Not on file   Social History Narrative     Review of Systems  Constitutional: Negative for fatigue and unexpected weight change.  Eyes: Negative for visual disturbance.  Respiratory: Negative for cough, chest tightness and shortness of breath.   Cardiovascular: Negative for chest pain, palpitations and leg swelling.  Gastrointestinal: Negative for abdominal pain and blood in stool.  Genitourinary: Positive for flank pain. Negative for difficulty urinating.  Neurological: Negative for dizziness, light-headedness and headaches.       Objective:   Physical Exam  Constitutional: He is oriented to person, place, and time. He appears well-developed and well-nourished.  HENT:  Head: Normocephalic and atraumatic.  Eyes: EOM are normal. Pupils are equal, round, and reactive to light.  Neck: No JVD present. Carotid bruit is not present.  Cardiovascular:  Normal rate and regular rhythm.   Murmur heard.  Systolic murmur is present with a grade of 2/6  Pulmonary/Chest: Effort normal and breath sounds normal. He has no rales.  Abdominal: Soft. There is no tenderness. There is no CVA tenderness.  Musculoskeletal: He exhibits no edema.  Neurological: He is alert and oriented to person, place, and time.  Skin: Skin is warm and dry.  Psychiatric: He has a normal mood and affect.  Vitals reviewed.     Filed Vitals:   05/20/15 1607  BP: 183/67  Pulse: 78  Temp: 97.8 F (36.6 C)  TempSrc: Oral  Resp: 16  Height: $Remove'5\' 8"'SrEkWIs$  (1.727 m)  Weight: 235 lb 3.2 oz (106.686 kg)  SpO2: 96%    Results for orders placed or performed in visit on 05/20/15  POCT glycosylated hemoglobin (Hb A1C)  Result Value Ref Range   Hemoglobin A1C 7.3  POCT glucose (manual entry)  Result Value Ref Range   POC Glucose 73 70 - 99 mg/dl  POCT urinalysis dipstick  Result Value Ref Range   Color, UA Light Yellow    Clarity, UA Clear    Glucose, UA Negative    Bilirubin, UA Negative    Ketones, UA Negative    Spec Grav, UA 1.015    Blood, UA Moderate    pH, UA 5.5    Protein, UA Negative    Urobilinogen, UA 0.2    Nitrite, UA Negative    Leukocytes, UA Negative Negative       Assessment & Plan:  Timothy Martin is a 69 y.o. male Type 2 diabetes mellitus with diabetic chronic kidney disease - Plan: POCT glycosylated hemoglobin (Hb A1C), POCT glucose (manual entry), Basic metabolic panel, Microalbumin, urine  -  Just above control. Will continue same dose of medication for now but discussed diet,  And recheck levels in 3 months.  - urine microalbumin pending, BMP pending.  - up-to-date with vision screening with recent Ortho visit as above.  - needs lipid panel, but not fasting today. recheck fasting in 3 months plan on lipid panel then  Right nephrolithiasis - Plan: Basic metabolic panel, Ambulatory referral to Urology, POCT urinalysis dipstick.    -CT  report 05/04/15:Mild right-sided hydronephrosis secondary to a proximal right ureteric stone. This measures 6 x 9 mm.   -  Now with only intermittent pain, but still with hematuria.   Suspect stone is still in place. Based on the size of the stone and persistent symptoms 2 weeks later including with use of Flomax, will refer to urology this week for further evaluation or imaging.    - He did have mild hydronephrosis on previous scan, will check BMP given elevation in blood pressure today. If creatinine has increased, will need repeat  CT or ultrasound to determine if further hydronephrosis present causing post renal acute on chronic failure.  Essential hypertension  -  Uncontrolled in office today.  Will try temporary increase of HCTZ to 25 mg daily. BMP as above,   And follow-up in the next 10 days if blood pressures are not improving. Orthostatic precautions if too strong of medication, and can cut back to 12.5 mg if he is running low.   Health maintenance  - Zostavax declined due to cost.  No orders of the defined types were placed in this encounter.   Patient Instructions  For your blood pressure - increase to two of the hydrocholothiazide (total dose of 25 mg) each day. Keep a record of your blood pressures outside of the office and bring them to the next office visit. If not below 140/90 in next 10 days - recheck to look at other medicine options.  I will refer you to a urologist for your kidney stone this week. Ok to continue the flomax, tylenol if needed and if needed for more severe pain - 1/2 of hydrocodone (as long as it is not causing same symptoms).  I am also checking your kidney function.  Return to the clinic or go to the nearest emergency room if any of your symptoms worsen or new symptoms occur.  Plan on fasting labs at next diabetes visit so we can check your cholesterol.   Your diabetes is not quite at goal of less than 7, but close at 7.3.  We can continue the same doses of  medicines for now, but watch your diet and we can recheck  levels in 3 months.       I personally performed the services described in this documentation, which was scribed in my presence. The recorded information has been reviewed and considered, and addended by me as needed.

## 2015-05-21 LAB — BASIC METABOLIC PANEL
BUN: 66 mg/dL — ABNORMAL HIGH (ref 6–23)
CALCIUM: 10 mg/dL (ref 8.4–10.5)
CO2: 20 meq/L (ref 19–32)
Chloride: 105 mEq/L (ref 96–112)
Creat: 3.63 mg/dL — ABNORMAL HIGH (ref 0.50–1.35)
Glucose, Bld: 67 mg/dL — ABNORMAL LOW (ref 70–99)
Potassium: 4.5 mEq/L (ref 3.5–5.3)
Sodium: 140 mEq/L (ref 135–145)

## 2015-05-21 LAB — MICROALBUMIN, URINE: MICROALB UR: 0.2 mg/dL (ref <2.0)

## 2015-05-25 ENCOUNTER — Other Ambulatory Visit: Payer: Self-pay | Admitting: Family Medicine

## 2015-05-25 DIAGNOSIS — R7989 Other specified abnormal findings of blood chemistry: Secondary | ICD-10-CM

## 2015-05-25 NOTE — Addendum Note (Signed)
Addended by: Neva Seat, Shariece Viveiros R on: 05/25/2015 11:06 AM   Modules accepted: Orders

## 2015-05-28 ENCOUNTER — Other Ambulatory Visit: Payer: Self-pay | Admitting: Urology

## 2015-06-12 ENCOUNTER — Other Ambulatory Visit (HOSPITAL_COMMUNITY): Payer: Self-pay | Admitting: *Deleted

## 2015-06-12 NOTE — Patient Instructions (Addendum)
YOUR PROCEDURE IS SCHEDULED ON :  06/18/15  REPORT TO [facility] MAIN ENTRANCE FOLLOW SIGNS TO EAST ELEVATOR - GO TO 3rd FLOOR CHECK IN AT 3 EAST NURSES STATION (SHORT STAY) AT:  8:30 AM  CALL THIS NUMBER IF YOU HAVE PROBLEMS THE MORNING OF SURGERY [phone]  REMEMBER:ONLY 1 PER PERSON MAY GO TO SHORT STAY WITH YOU TO GET READY THE MORNING OF YOUR SURGERY  DO NOT EAT FOOD OR DRINK LIQUIDS AFTER MIDNIGHT  TAKE THESE MEDICINES THE MORNING OF SURGERY: METOPROLOL  YOU MAY NOT HAVE ANY METAL ON YOUR BODY INCLUDING HAIR PINS AND PIERCING'S. DO NOT WEAR JEWELRY, MAKEUP, LOTIONS, POWDERS OR PERFUMES. DO NOT WEAR NAIL POLISH. DO NOT SHAVE 48 HRS PRIOR TO SURGERY. MEN MAY SHAVE FACE AND NECK.  DO NOT BRING VALUABLES TO HOSPITAL. [facility] IS NOT RESPONSIBLE FOR VALUABLES.  CONTACTS, DENTURES OR PARTIALS MAY NOT BE WORN TO SURGERY. LEAVE SUITCASE IN CAR. CAN BE BROUGHT TO ROOM AFTER SURGERY.  PATIENTS DISCHARGED THE DAY OF SURGERY WILL NOT BE ALLOWED TO DRIVE HOME.  PLEASE READ OVER THE FOLLOWING INSTRUCTION SHEETS _________________________________________________________________________________                                          [facility] - PREPARING FOR SURGERY  Before surgery, you can play an important role.  Because skin is not sterile, your skin needs to be as free of germs as possible.  You can reduce the number of germs on your skin by washing with CHG (chlorahexidine gluconate) soap before surgery.  CHG is an antiseptic cleaner which kills germs and bonds with the skin to continue killing germs even after washing. Please DO NOT use if you have an allergy to CHG or antibacterial soaps.  If your skin becomes reddened/irritated stop using the CHG and inform your nurse when you arrive at Short Stay. Do not shave (including legs and underarms) for at least 48 hours prior to the first CHG shower.  You may shave your face. Please follow these instructions  carefully:   1.  Shower with CHG Soap the night before surgery and the  morning of Surgery.   2.  If you choose to wash your hair, wash your hair first as usual with your  normal  Shampoo.   3.  After you shampoo, rinse your hair and body thoroughly to remove the  shampoo.                                         4.  Use CHG as you would any other liquid soap.  You can apply chg directly  to the skin and wash . Gently wash with scrungie or clean wascloth    5.  Apply the CHG Soap to your body ONLY FROM THE NECK DOWN.   Do not use on open                           Wound or open sores. Avoid contact with eyes, ears mouth and genitals (private parts).                        Genitals (private parts) with your normal soap.  6.  Wash thoroughly, paying special attention to the area where your surgery  will be performed.   7.  Thoroughly rinse your body with warm water from the neck down.   8.  DO NOT shower/wash with your normal soap after using and rinsing off  the CHG Soap .                9.  Pat yourself dry with a clean towel.             10.  Wear clean night clothes to bed after shower             11.  Place clean sheets on your bed the night of your first shower and do not  sleep with pets.  Day of Surgery : Do not apply any lotions/deodorants the morning of surgery.  Please wear clean clothes to the hospital/surgery center.  FAILURE TO FOLLOW THESE INSTRUCTIONS MAY RESULT IN THE CANCELLATION OF YOUR SURGERY    PATIENT SIGNATURE_________________________________  ______________________________________________________________________

## 2015-06-14 ENCOUNTER — Encounter (HOSPITAL_COMMUNITY): Payer: Self-pay

## 2015-06-14 ENCOUNTER — Encounter (HOSPITAL_COMMUNITY)
Admission: RE | Admit: 2015-06-14 | Discharge: 2015-06-14 | Disposition: A | Discharge status: Home / Self Care | Payer: Medicare PPO | Source: Ambulatory Visit | Attending: Urology | Admitting: Urology

## 2015-06-14 DIAGNOSIS — N201 Calculus of ureter: Secondary | ICD-10-CM | POA: Diagnosis not present

## 2015-06-14 DIAGNOSIS — Z0183 Encounter for blood typing: Secondary | ICD-10-CM | POA: Insufficient documentation

## 2015-06-14 DIAGNOSIS — Z01810 Encounter for preprocedural cardiovascular examination: Secondary | ICD-10-CM | POA: Insufficient documentation

## 2015-06-14 DIAGNOSIS — Z01812 Encounter for preprocedural laboratory examination: Secondary | ICD-10-CM | POA: Insufficient documentation

## 2015-06-14 HISTORY — DX: Calculus of kidney: N20.0

## 2015-06-14 HISTORY — DX: Cardiac murmur, unspecified: R01.1

## 2015-06-14 HISTORY — DX: Hyperlipidemia, unspecified: E78.5

## 2015-06-14 LAB — BASIC METABOLIC PANEL
ANION GAP: 9 (ref 5–15)
BUN: 32 mg/dL — AB (ref 6–20)
CALCIUM: 9.5 mg/dL (ref 8.9–10.3)
CHLORIDE: 105 mmol/L (ref 101–111)
CO2: 25 mmol/L (ref 22–32)
CREATININE: 1.76 mg/dL — AB (ref 0.61–1.24)
GFR, EST AFRICAN AMERICAN: 44 mL/min — AB (ref >60)
GFR, EST NON AFRICAN AMERICAN: 38 mL/min — AB (ref >60)
Glucose, Bld: 132 mg/dL — ABNORMAL HIGH (ref 65–99)
Potassium: 4.1 mmol/L (ref 3.5–5.1)
Sodium: 139 mmol/L (ref 135–145)

## 2015-06-14 LAB — CBC
HCT: 39.9 % (ref 39.0–52.0)
Hemoglobin: 13.2 g/dL (ref 13.0–17.0)
MCH: 29.4 pg (ref 26.0–34.0)
MCHC: 33.1 g/dL (ref 30.0–36.0)
MCV: 88.9 fL (ref 78.0–100.0)
PLATELETS: 109 10*3/uL — AB (ref 150–400)
RBC: 4.49 MIL/uL (ref 4.22–5.81)
RDW: 13.2 % (ref 11.5–15.5)
WBC: 9.4 10*3/uL (ref 4.0–10.5)

## 2015-06-14 NOTE — Progress Notes (Signed)
   06/14/15 0951  OBSTRUCTIVE SLEEP APNEA  Have you ever been diagnosed with sleep apnea through a sleep study? No  Do you snore loudly (loud enough to be heard through closed doors)?  0  Do you often feel tired, fatigued, or sleepy during the daytime? 0  Has anyone observed you stop breathing during your sleep? 0  Do you have, or are you being treated for high blood pressure? 1  BMI more than 35 kg/m2? 1  Age over 68 years old? 1  Neck circumference greater than 40 cm/16 inches? 1  Gender: 1

## 2015-06-14 NOTE — Progress Notes (Signed)
Abnormal CBC / BMET faxed to Dr.Wrenn

## 2015-06-17 NOTE — H&P (Signed)
Active Problems Problems  1. Calculus of right ureter (N20.1) 2. 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 He had moderate pain starting on 7/2. He had nausea initially and had hematuria. He was seen in the ER that day and he had a CT that showed an obstructing 6x59mm stone with a density of 355 HU. The stone was only faintly seen on KUB. He last had pain last weekend.  He has some frequency but is pushing fluids.  He has no bladder pain or urgency.   Past Medical History Problems  1. History of cardiac murmur (Z86.79) 2. History of diabetes mellitus (Z86.39) 3. History of hypertension (Z86.79)  Surgical History Problems  1. History of Tonsillectomy  Current Meds 1. Cozaar 100 MG Oral Tablet;  Therapy: (Recorded:22Jul2016) to Recorded 2. EQL Fish Oil 1000 MG Oral Capsule;  Therapy: (Recorded:22Jul2016) to Recorded 3. Glimepiride 4 MG Oral Tablet;  Therapy: (Recorded:22Jul2016) to Recorded 4. Hydrochlorothiazide 12.5 MG Oral Tablet;  Therapy: (Recorded:22Jul2016) to Recorded 5. Metoprolol Succinate ER 100 MG Oral Tablet Extended Release 24 Hour;  Therapy: (Recorded:22Jul2016) to Recorded 6. Vitamin B-12 1000 MCG Oral Tablet;  Therapy: (Recorded:22Jul2016) to Recorded 7. Zocor 40 MG Oral Tablet;  Therapy: (Recorded:22Jul2016) to Recorded  Allergies Medication  1. codeine 2. hydrocodone 3. Augmentin  Family History Problems  1. Family history of Death of family member : Mother, Father   mother age of death - 20, father- 8 2. Family history of lung cancer (Z80.1) : Mother, Father  Social History Problems    Denied: History of Alcohol use   Caffeine use (F15.90)   1 per week   Has no children   Married   Never a smoker   Retired  Review of Systems Genitourinary, constitutional, skin, eye, otolaryngeal, hematologic/lymphatic, cardiovascular, pulmonary,  endocrine, musculoskeletal, gastrointestinal, neurological and psychiatric system(s) were reviewed and pertinent findings if present are noted and are otherwise negative.  Genitourinary: hematuria.    Vitals Vital Signs [Data Includes: Last 1 Day]  Recorded: 22Jul2016 09:50AM  Height: 5 ft 8 in Weight: 235 lb  BMI Calculated: 35.73 BSA Calculated: 2.19 Blood Pressure: 170 / 80 Temperature: 97.4 F Heart Rate: 83  Physical Exam Constitutional: Well nourished and well developed . No acute distress.  ENT:. The ears and nose are normal in appearance.  Neck: The appearance of the neck is normal and no neck mass is present.  Pulmonary: No respiratory distress and normal respiratory rhythm and effort.  Cardiovascular: Heart rate and rhythm are normal . No peripheral edema.  Abdomen: The abdomen is mildly obese. The abdomen is soft and nontender. No masses are palpated. No CVA tenderness. No hernias are palpable. No hepatosplenomegaly noted.  Lymphatics: The posterior cervical and supraclavicular nodes are not enlarged or tender.  Skin: Normal skin turgor, no visible rash and no visible skin lesions.  Neuro/Psych:. Mood and affect are appropriate.    Results/Data Urine [Data Includes: Last 1 Day]   22Jul2016  COLOR AMBER   APPEARANCE CLOUDY   SPECIFIC GRAVITY 1.025   pH 5.0   GLUCOSE NEG mg/dL  BILIRUBIN NEG   KETONE NEG mg/dL  BLOOD LARGE   PROTEIN TRACE mg/dL  UROBILINOGEN 0.2 mg/dL  NITRITE NEG   LEUKOCYTE ESTERASE NEG   SQUAMOUS EPITHELIAL/HPF RARE   WBC 0-2 WBC/hpf  RBC 21-50 RBC/hpf  BACTERIA NONE SEEN   CRYSTALS NONE SEEN  CASTS NONE SEEN   Other MODERATE NON MOTILE SPERM    Old records or history reviewed: ER notes reviewed.  The following images/tracing/specimen were independently visualized:  KUB today shows a faint 6mm shadow at L3 on the right consistent with the prior KUB and CT but the stone is very faint. He has no other significant bone, soft tissue or gas  pattern abnormalities. A right renal US today shows a 12.9cm right kidney with mild to moderate hydro and a dilated proximal ureter. There are no mass lesions but there is a possible 4mm LLP stone. The bladder volume is . There is a 4mm calcification that could be in the prostate or possibly the distal ureter. Left jet seen. Right not seen. Outside CT and KUB films and report reviewed.  The following clinical lab reports were reviewed:  UA and outside labs reviewed.    Assessment Assessed  1. Calculus of right ureter (N20.1)  He has a 6x82mm right proximal stone that doesn't appear to have moved in the last 3 weeks and he has mild/moderate right hydro.   Plan Calculus of right ureter  1. Follow-up Schedule Surgery Office  Follow-up  Status: Hold For - Appointment   Requested for: 22Jul2016 2. HYPERCALCIURA PROFILE; Status:In Progress - Specimen/Data Collected;   Done:  22Jul2016 3. KUB; Status:Resulted - Requires Verification;   Done: 22Jul2016 10:57AM 4. RENAL U/S RIGHT; Status:Resulted - Requires Verification;   Done: 22Jul2016 11:18AM 5. VENIPUNCTURE; Status:Complete;   Done: 22Jul2016  I discussed the options with him including continued observation or ureteroscopy. I don't think he would be a good ESWL candidate because the stone is poorly seen on KUB.  I reviewed the risks of ureteroscopy including bleeding,infection, ureteral injury, need for a stent or secondary procedures, thrombotic events and anesthetic complications. He would like to proceed with that and will be scheduled.

## 2015-06-18 ENCOUNTER — Encounter (HOSPITAL_COMMUNITY): Payer: Self-pay | Admitting: *Deleted

## 2015-06-18 ENCOUNTER — Ambulatory Visit (HOSPITAL_COMMUNITY)
Admission: RE | Admit: 2015-06-18 | Discharge: 2015-06-18 | Disposition: A | Discharge status: Home / Self Care | Payer: Medicare HMO | Source: Ambulatory Visit | Attending: Urology | Admitting: Urology

## 2015-06-18 ENCOUNTER — Ambulatory Visit (HOSPITAL_COMMUNITY): Payer: Medicare HMO | Admitting: Anesthesiology

## 2015-06-18 ENCOUNTER — Encounter (HOSPITAL_COMMUNITY)
Admission: RE | Disposition: A | Discharge status: Home / Self Care | Payer: Self-pay | Source: Ambulatory Visit | Attending: Urology

## 2015-06-18 DIAGNOSIS — R109 Unspecified abdominal pain: Secondary | ICD-10-CM | POA: Diagnosis present

## 2015-06-18 DIAGNOSIS — N201 Calculus of ureter: Secondary | ICD-10-CM | POA: Diagnosis not present

## 2015-06-18 DIAGNOSIS — H5501 Congenital nystagmus: Secondary | ICD-10-CM | POA: Diagnosis not present

## 2015-06-18 DIAGNOSIS — Z79899 Other long term (current) drug therapy: Secondary | ICD-10-CM | POA: Diagnosis not present

## 2015-06-18 DIAGNOSIS — E119 Type 2 diabetes mellitus without complications: Secondary | ICD-10-CM | POA: Diagnosis not present

## 2015-06-18 DIAGNOSIS — I10 Essential (primary) hypertension: Secondary | ICD-10-CM | POA: Insufficient documentation

## 2015-06-18 HISTORY — PX: CYSTOSCOPY WITH RETROGRADE PYELOGRAM, URETEROSCOPY AND STENT PLACEMENT: SHX5789

## 2015-06-18 LAB — GLUCOSE, CAPILLARY
Glucose-Capillary: 128 mg/dL — ABNORMAL HIGH (ref 65–99)
Glucose-Capillary: 125 mg/dL — ABNORMAL HIGH (ref 65–99)
Glucose-Capillary: 172 mg/dL — ABNORMAL HIGH (ref 65–99)

## 2015-06-18 SURGERY — CYSTOURETEROSCOPY, WITH RETROGRADE PYELOGRAM AND STENT INSERTION
Anesthesia: General | Laterality: Right

## 2015-06-18 MED ORDER — LACTATED RINGERS IV SOLN: INTRAVENOUS | Status: DC

## 2015-06-18 MED ORDER — SODIUM CHLORIDE 0.9 % IR SOLN
Status: DC | PRN
  Administered 2015-06-18: 4000 mL

## 2015-06-18 MED ORDER — LIDOCAINE HCL (CARDIAC) 20 MG/ML IV SOLN
INTRAVENOUS | Status: DC | PRN
  Administered 2015-06-18: 10 mg via INTRAVENOUS

## 2015-06-18 MED ORDER — LACTATED RINGERS IV SOLN
INTRAVENOUS | Status: DC
  Administered 2015-06-18: 1000 mL via INTRAVENOUS

## 2015-06-18 MED ORDER — FENTANYL CITRATE (PF) 100 MCG/2ML IJ SOLN
INTRAMUSCULAR | Status: AC
  Filled 2015-06-18: qty 4

## 2015-06-18 MED ORDER — HYDROCODONE-ACETAMINOPHEN 5-325 MG PO TABS: 1.0000 | ORAL_TABLET | Freq: Four times a day (QID) | ORAL | Status: DC | PRN

## 2015-06-18 MED ORDER — LIDOCAINE HCL (CARDIAC) 20 MG/ML IV SOLN
INTRAVENOUS | Status: AC
  Filled 2015-06-18: qty 5

## 2015-06-18 MED ORDER — DEXAMETHASONE SODIUM PHOSPHATE 10 MG/ML IJ SOLN
INTRAMUSCULAR | Status: AC
  Filled 2015-06-18: qty 1

## 2015-06-18 MED ORDER — ONDANSETRON HCL 4 MG/2ML IJ SOLN
INTRAMUSCULAR | Status: AC
  Filled 2015-06-18: qty 2

## 2015-06-18 MED ORDER — CIPROFLOXACIN IN D5W 400 MG/200ML IV SOLN
400.0000 mg | INTRAVENOUS | Status: AC
  Administered 2015-06-18: 400 mg via INTRAVENOUS

## 2015-06-18 MED ORDER — FENTANYL CITRATE (PF) 100 MCG/2ML IJ SOLN
INTRAMUSCULAR | Status: DC | PRN
  Administered 2015-06-18: 50 ug via INTRAVENOUS

## 2015-06-18 MED ORDER — PROPOFOL 10 MG/ML IV BOLUS
INTRAVENOUS | Status: DC | PRN
  Administered 2015-06-18: 200 mg via INTRAVENOUS

## 2015-06-18 MED ORDER — FENTANYL CITRATE (PF) 100 MCG/2ML IJ SOLN: 25.0000 ug | INTRAMUSCULAR | Status: DC | PRN

## 2015-06-18 MED ORDER — ONDANSETRON HCL 4 MG/2ML IJ SOLN
INTRAMUSCULAR | Status: DC | PRN
  Administered 2015-06-18: 4 mg via INTRAVENOUS

## 2015-06-18 MED ORDER — PHENYLEPHRINE 40 MCG/ML (10ML) SYRINGE FOR IV PUSH (FOR BLOOD PRESSURE SUPPORT)
PREFILLED_SYRINGE | INTRAVENOUS | Status: AC
  Filled 2015-06-18: qty 10

## 2015-06-18 MED ORDER — CIPROFLOXACIN IN D5W 400 MG/200ML IV SOLN
INTRAVENOUS | Status: AC
  Filled 2015-06-18: qty 200

## 2015-06-18 MED ORDER — MIDAZOLAM HCL 5 MG/5ML IJ SOLN
INTRAMUSCULAR | Status: DC | PRN
  Administered 2015-06-18: 2 mg via INTRAVENOUS

## 2015-06-18 MED ORDER — MIDAZOLAM HCL 2 MG/2ML IJ SOLN
INTRAMUSCULAR | Status: AC
  Filled 2015-06-18: qty 4

## 2015-06-18 MED ORDER — PHENYLEPHRINE HCL 10 MG/ML IJ SOLN
INTRAMUSCULAR | Status: DC | PRN
  Administered 2015-06-18 (×3): 80 ug via INTRAVENOUS

## 2015-06-18 MED ORDER — PROPOFOL 10 MG/ML IV BOLUS
INTRAVENOUS | Status: AC
  Filled 2015-06-18: qty 20

## 2015-06-18 MED ORDER — DEXAMETHASONE SODIUM PHOSPHATE 10 MG/ML IJ SOLN
INTRAMUSCULAR | Status: DC | PRN
  Administered 2015-06-18: 10 mg via INTRAVENOUS

## 2015-06-18 SURGICAL SUPPLY — 16 items
BAG URO CATCHER STRL LF (DRAPE) ×3 IMPLANT
CATH URET 5FR 28IN OPEN ENDED (CATHETERS) ×3 IMPLANT
CLOTH BEACON ORANGE TIMEOUT ST (SAFETY) ×3 IMPLANT
FIBER LASER FLEXIVA 1000 (UROLOGICAL SUPPLIES) IMPLANT
FIBER LASER FLEXIVA 200 (UROLOGICAL SUPPLIES) IMPLANT
FIBER LASER FLEXIVA 365 (UROLOGICAL SUPPLIES) IMPLANT
FIBER LASER FLEXIVA 550 (UROLOGICAL SUPPLIES) IMPLANT
FIBER LASER TRAC TIP (UROLOGICAL SUPPLIES) IMPLANT
GLOVE SURG SS PI 8.0 STRL IVOR (GLOVE) ×3 IMPLANT
GOWN STRL REUS W/TWL XL LVL3 (GOWN DISPOSABLE) IMPLANT
GUIDEWIRE STR DUAL SENSOR (WIRE) ×3 IMPLANT
MANIFOLD NEPTUNE II (INSTRUMENTS) ×3 IMPLANT
PACK CYSTO (CUSTOM PROCEDURE TRAY) ×3 IMPLANT
SHEATH ACCESS URETERAL 38CM (SHEATH) IMPLANT
TUBING CONNECTING 10 (TUBING) ×2 IMPLANT
TUBING CONNECTING 10' (TUBING) ×1

## 2015-06-18 NOTE — Brief Op Note (Signed)
06/18/2015  10:46 AM  PATIENT:  Odella Aquas  69 y.o. male  PRE-OPERATIVE DIAGNOSIS:  RIGHT PROXIMAL URETERAL STONE   POST-OPERATIVE DIAGNOSIS:  RIGHT PROXIMAL URETERAL STONE HAD PASSED  PROCEDURE:  Procedure(s): CYSTOSCOPY WITH RIGHT RETROGRADE PYELOGRAM, RIGHT URETEROSCOPY  (Right)  SURGEON:  Surgeon(s) and Role:    * Bjorn Pippin, MD - Primary  PHYSICIAN ASSISTANT:   ASSISTANTS: none   ANESTHESIA:   general  EBL:     BLOOD ADMINISTERED:none  DRAINS: none   LOCAL MEDICATIONS USED:  NONE  SPECIMEN:  No Specimen  DISPOSITION OF SPECIMEN:  N/A  COUNTS:  YES  TOURNIQUET:  * No tourniquets in log *  DICTATION: .Other Dictation: Dictation Number [id number]  PLAN OF CARE: Discharge to home after PACU  PATIENT DISPOSITION:  PACU - hemodynamically stable.   Delay start of Pharmacological VTE agent (>24hrs) due to surgical blood loss or risk of bleeding: not applicable

## 2015-06-18 NOTE — Discharge Instructions (Signed)

## 2015-06-18 NOTE — Anesthesia Preprocedure Evaluation (Addendum)
Anesthesia Evaluation  Patient identified by MRN, date of birth, ID band Patient awake    Reviewed: Allergy & Precautions, H&P , NPO status , Patient's Chart, lab work & pertinent test results, reviewed documented beta blocker date and time   Airway Mallampati: II  TM Distance: >3 FB Neck ROM: full    Dental  (+) Dental Advisory Given, Missing, Chipped, Poor Dentition   Pulmonary neg pulmonary ROS,  breath sounds clear to auscultation  Pulmonary exam normal       Cardiovascular Exercise Tolerance: Good hypertension, Pt. on medications and Pt. on home beta blockers Normal cardiovascular examRhythm:regular Rate:Normal     Neuro/Psych Congenital nystagmus negative neurological ROS  negative psych ROS   GI/Hepatic negative GI ROS, Neg liver ROS,   Endo/Other  diabetes, Well Controlled, Type 2, Oral Hypoglycemic Agents  Renal/GU Renal InsufficiencyRenal diseaseCRT 1.76  negative genitourinary   Musculoskeletal   Abdominal   Peds  Hematology negative hematology ROS (+)   Anesthesia Other Findings   Reproductive/Obstetrics negative OB ROS                            Anesthesia Physical Anesthesia Plan  ASA: III  Anesthesia Plan: General   Post-op Pain Management:    Induction: Intravenous  Airway Management Planned: LMA  Additional Equipment:   Intra-op Plan:   Post-operative Plan:   Informed Consent: I have reviewed the patients History and Physical, chart, labs and discussed the procedure including the risks, benefits and alternatives for the proposed anesthesia with the patient or authorized representative who has indicated his/her understanding and acceptance.   Dental Advisory Given  Plan Discussed with: CRNA and Surgeon  Anesthesia Plan Comments:         Anesthesia Quick Evaluation

## 2015-06-18 NOTE — Anesthesia Postprocedure Evaluation (Signed)
  Anesthesia Post-op Note  Patient: Timothy Martin  Procedure(s) Performed: Procedure(s) (LRB): CYSTOSCOPY WITH RIGHT RETROGRADE PYELOGRAM, RIGHT URETEROSCOPY  (Right)  Patient Location: PACU  Anesthesia Type: General  Level of Consciousness: awake and alert   Airway and Oxygen Therapy: Patient Spontanous Breathing  Post-op Pain: mild  Post-op Assessment: Post-op Vital signs reviewed, Patient's Cardiovascular Status Stable, Respiratory Function Stable, Patent Airway and No signs of Nausea or vomiting  Last Vitals:  Filed Vitals:   06/18/15 1141  BP: 120/62  Pulse:   Temp: 36.5 C  Resp: 14    Post-op Vital Signs: stable   Complications: No apparent anesthesia complications

## 2015-06-18 NOTE — Transfer of Care (Signed)
Immediate Anesthesia Transfer of Care Note  Patient: Timothy Martin  Procedure(s) Performed: Procedure(s): CYSTOSCOPY WITH RIGHT RETROGRADE PYELOGRAM, RIGHT URETEROSCOPY  (Right)  Patient Location: PACU  Anesthesia Type:General  Level of Consciousness: sedated  Airway & Oxygen Therapy: Patient Spontanous Breathing and Patient connected to face mask oxygen  Post-op Assessment: Report given to RN and Post -op Vital signs reviewed and stable  Post vital signs: Reviewed and stable  Last Vitals:  Filed Vitals:   06/18/15 0819  BP: 149/77  Pulse: 76  Temp: 36.4 C  Resp: 18    Complications: No apparent anesthesia complications

## 2015-06-18 NOTE — Interval H&P Note (Signed)
History and Physical Interval Note:  06/18/2015 10:11 AM  Timothy Martin  has presented today for surgery, with the diagnosis of RIGHT PROXIMAL URETERAL STONE   The various methods of treatment have been discussed with the patient and family. After consideration of risks, benefits and other options for treatment, the patient has consented to  Procedure(s): CYSTOSCOPY WITH RIGHT RETROGRADE PYELOGRAM, RIGHT URETEROSCOPY WITH STENT PLACEMENT (Right) HOLMIUM LASER APPLICATION (Right) as a surgical intervention .  The patient's history has been reviewed, patient examined, no change in status, stable for surgery.  I have reviewed the patient's chart and labs.  Questions were answered to the patient's satisfaction.     Pleasant Bensinger J

## 2015-06-18 NOTE — Op Note (Signed)
NAMECHARLY, HOLCOMB             ACCOUNT NO.:  [id number]  MEDICAL RECORD NO.:  [id number]  LOCATION:  [facility]                         FACILITY:  [facility]  PHYSICIAN:  Excell Seltzer. Annabell Howells, M.D.    DATE OF BIRTH:  22-Jun-1946  DATE OF PROCEDURE:  06/18/2015 DATE OF DISCHARGE:  06/18/2015                              OPERATIVE REPORT   PROCEDURE:  Cystoscopy with right retrograde pyelogram and interpretation and right ureteroscopy.  PREOPERATIVE DIAGNOSIS:  Right proximal ureteral stone.  POSTOPERATIVE DIAGNOSIS:  Right proximal ureteral stone.  SURGEON:  Excell Seltzer. Annabell Howells, M.D.  ANESTHESIA:  General.  SPECIMENS:  None.  DRAINS:  None.  COMPLICATIONS:  None.  ESTIMATED BLOOD LOSS:  None.  INDICATIONS:  Mr. Restivo is a 69 year old white male who had an episode of right flank pain and was found to have a 6 x 9 mm right proximal ureteral stone and had very low density, suggesting uric acid stone.  He presents today for ureteroscopy, reports he has been pain- free, but he has not seen anything pass.  FINDINGS AND PROCEDURE DESCRIPTION:  He was taken to the operating room where general anesthetic was induced.  He was given Cipro.  He was placed in lithotomy position and fitted with PAS hose.  His perineum and genitalia were prepped with Betadine solution.  He was draped in usual sterile fashion.  Cystoscopy was performed using the 23-French scope and 30-degree lens. Examination revealed a normal urethra.  The external sphincter was intact.  The prostatic urethra was approximately 3 cm in length with trilobar hyperplasia without a significant middle lobe.  There was some obstruction.  Examination of the bladder revealed mild trabeculation.  No tumors were noted.  There were some small yellowish stone fragments in the base of the bladder.  The left ureteral orifice was unremarkable.  The right ureteral orifice had some hemorrhagic changes that were consistent with possible recent stone  passage.  A 5.0 open-end catheter was then passed through the cystoscope and contrast was instilled in the right ureteral orifice.  This demonstrated delicate ureter and intrarenal collecting system.  There was some question of a possible filling defect in the mid ureteral area, but this was a subtle finding and collecting system drained rapidly.  Because of the question of the possible filling defect and the fact that the stone was felt to be radiolucent, felt a gentle attempt with 4.5- French semi-rigid ureteroscope was indicated.  A 4.5-French ureteroscope was then passed per urethra.  The ureter was easily entered and the scope was easily advanced to the UPJ, no stones were seen.  The scope was removed.  There was no significant ureteral trauma identified, so it was not felt the stent was indicated.  At this point, the cystoscope was reinserted and the bladder was drained.  The scope was removed.  The patient was taken down from lithotomy position.  His anesthetic was reversed.  He was moved to the recovery room in stable condition.  I did attempt to aspirate the stone material from the bladder, but it was so small that it was not retrievable.  There were no complications.     Excell Seltzer. Annabell Howells, M.D.  JJW/MEDQ  D:  06/18/2015  T:  06/18/2015  Job:  [id number]

## 2015-06-19 ENCOUNTER — Encounter (HOSPITAL_COMMUNITY): Payer: Self-pay | Admitting: Urology

## 2015-08-26 ENCOUNTER — Encounter: Payer: Self-pay | Admitting: Family Medicine

## 2015-08-26 ENCOUNTER — Ambulatory Visit (INDEPENDENT_AMBULATORY_CARE_PROVIDER_SITE_OTHER): Payer: Medicare HMO | Admitting: Family Medicine

## 2015-08-26 VITALS — BP 146/82 | HR 75 | Temp 98.3°F | Resp 16 | Ht 67.5 in | Wt 236.8 lb

## 2015-08-26 DIAGNOSIS — I10 Essential (primary) hypertension: Secondary | ICD-10-CM

## 2015-08-26 DIAGNOSIS — E1122 Type 2 diabetes mellitus with diabetic chronic kidney disease: Secondary | ICD-10-CM

## 2015-08-26 DIAGNOSIS — R748 Abnormal levels of other serum enzymes: Secondary | ICD-10-CM

## 2015-08-26 DIAGNOSIS — E785 Hyperlipidemia, unspecified: Secondary | ICD-10-CM | POA: Diagnosis not present

## 2015-08-26 DIAGNOSIS — R7989 Other specified abnormal findings of blood chemistry: Secondary | ICD-10-CM

## 2015-08-26 DIAGNOSIS — Z23 Encounter for immunization: Secondary | ICD-10-CM | POA: Diagnosis not present

## 2015-08-26 MED ORDER — SIMVASTATIN 40 MG PO TABS: 20.0000 mg | ORAL_TABLET | Freq: Every day | ORAL | Status: DC

## 2015-08-26 MED ORDER — LOSARTAN POTASSIUM 100 MG PO TABS: 100.0000 mg | ORAL_TABLET | Freq: Every day | ORAL | Status: DC

## 2015-08-26 MED ORDER — HYDROCHLOROTHIAZIDE 25 MG PO TABS: 25.0000 mg | ORAL_TABLET | Freq: Every day | ORAL | Status: DC

## 2015-08-26 MED ORDER — METOPROLOL SUCCINATE ER 100 MG PO TB24: 100.0000 mg | ORAL_TABLET | Freq: Every day | ORAL | Status: DC

## 2015-08-26 NOTE — Patient Instructions (Signed)
Return for fasting labs in next week. You should receive a call or letter about your lab results within the next week to 10 days once blood drawn.  meds refilled for now, but if changes needed - will let you know.   Plan on follow up in 3 months.   Return to the clinic or go to the nearest emergency room if any of your symptoms worsen or new symptoms occur.

## 2015-08-26 NOTE — Progress Notes (Addendum)
Subjective:  This chart was scribed for Timothy Ray, MD by Thea Alken, ED Scribe. This patient was seen in room 22 and the patient's care was started at 3:45 PM.   Patient ID: Timothy Martin, male    DOB: 07-24-1946, 69 y.o.   MRN: [id number]  HPI   Chief Complaint  Patient presents with  . Medication Refill    glimepiride, hctz, losartan, metoprolol succinale, simvastatin   HPI Comments: Sammuel Martin is a  male who presents to the [facility] for a medication refill.   HTN Here for med refills. See med list. Elevated at last visit. Increased HCTZ to 25 mg.   Lab Results  Component Value Date   CREATININE 1.76* 06/14/2015   BP home readings have been 125-140/70's.   Kidney stone Nephrolithiasis at last visit. Subsequently had f/u with neuro in august for obstructive 6 mm stone. He has cystoscopy on 8/16. Repeat creatinine has not been drawn. Pt has been urinating normal. No hematuria. Drinks about 3-4 bottles a day.   Diabetes With diabetic chronic kidney disease Lab Results  Component Value Date   HGBA1C 7.3 05/20/2015   Lab Results  Component Value Date   MICROALBUR 0.2 05/20/2015   Borderline controlled at last visit. Continue meds from last visit. Pt home readings have been 125-135. No low blood sugar symptoms.   Wt Readings from Last 3 Encounters:  08/26/15 236 lb 12.8 oz (107.412 kg)  06/18/15 239 lb (108.41 kg)  06/14/15 239 lb (108.41 kg)   Hyperlipidemia  Lab Results  Component Value Date   CHOL 108 03/15/2014   HDL 31* 03/15/2014   LDLCALC 60 03/15/2014   TRIG 87 03/15/2014   CHOLHDL 3.5 03/15/2014   He was not fasting at last visit. He is not fasting today, last ate at 11:30.    Patient Active Problem List   Diagnosis Date Noted  . Obesity (BMI 30-39.9) 08/03/2013  . Hypertension 03/29/2013  . Type 2 diabetes mellitus ([place]) 03/29/2013  . Elevated cholesterol 03/29/2013  . 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 Diabetes mellitus   . Hypertension   . Heart murmur   . Hyperlipidemia   . Kidney stone    Past Surgical History  Procedure Laterality Date  . Tonsillectomy    . Cystoscopy with retrograde pyelogram, ureteroscopy and stent placement Right 06/18/2015    Procedure: CYSTOSCOPY WITH RIGHT RETROGRADE PYELOGRAM, RIGHT URETEROSCOPY ;  Surgeon: [name], MD;  Location: [facility];  Service: Urology;  Laterality: Right;   Allergies  Allergen Reactions  . Codeine Other (See Comments)    CHILDHOOD  Allergic to all forms of Codeine   Jittery cannot function  . Augmentin [Amoxicillin-Pot Clavulanate] Rash   Prior to Admission medications   Medication Sig Start Date End Date Taking? Authorizing Provider  fish oil-omega-3 fatty acids 1000 MG capsule Take 1 g by mouth 2 (two) times daily after a meal.   Yes Historical Provider, MD  glimepiride (AMARYL) 4 MG tablet Take 1 tablet (4 mg total) by mouth daily with breakfast. 11/22/14  Yes Robyn Haber, MD  hydrochlorothiazide (HYDRODIURIL) 12.5 MG tablet Take 1 tablet (12.5 mg total) by mouth daily. 11/22/14  Yes Robyn Haber, MD  losartan (COZAAR) 100 MG tablet Take 1 tablet (100 mg total) by mouth daily. NEED REFILLS 07/19/14  Yes Robyn Haber, MD  metoprolol succinate (TOPROL-XL) 100 MG 24 hr tablet Take 1 tablet (100 mg total)  by mouth daily. Take with or immediately following a meal. 07/19/14  Yes Robyn Haber, MD  simvastatin (ZOCOR) 40 MG tablet Take 0.5 tablets (20 mg total) by mouth daily at 6 PM. 07/19/14  Yes Robyn Haber, MD  tamsulosin (FLOMAX) 0.4 MG CAPS capsule Take 1 capsule (0.4 mg total) by mouth daily. 05/04/15  Yes Quintella Reichert, MD  vitamin B-12 (CYANOCOBALAMIN) 100 MCG tablet Take 100 mcg by mouth every morning.    Yes Historical Provider, MD  Alcohol Swabs PADS USE TO CHECK BLOOD SUGAR DAILY. DX CODE: 250.00 Patient not taking: Reported on 06/10/2015 05/18/13   [name], PA-C  Blood  Glucose Monitoring Suppl (BLOOD GLUCOSE METER) kit Use to test blood sugar daily. Dx code: 250.00 05/18/13   [name], PA-C  glucose blood test strip Use to check blood sugar daily. Dx code: 250.00 05/18/13   [name], PA-C  HYDROcodone-acetaminophen (NORCO) 5-325 MG per tablet Take 1 tablet by mouth every 6 (six) hours as needed for moderate pain. Patient not taking: Reported on 08/26/2015 06/18/15   [name], MD  Lancets MISC Use to test blood sugar daily. Dx code:250.00 05/18/13   [name], PA-C   Social History   Social History  . Marital Status: Married    Spouse Name: N/A  . Number of Children: N/A  . Years of Education: N/A   Occupational History  . Not on file.   Social History Main Topics  . Smoking status: Never Smoker   . Smokeless tobacco: Not on file  . Alcohol Use: No  . Drug Use: No  . Sexual Activity: Not on file   Other Topics Concern  . Not on file   Social History Narrative   Review of Systems  Constitutional: Negative for fatigue and unexpected weight change.  Eyes: Negative for visual disturbance.  Respiratory: Negative for cough, chest tightness and shortness of breath.   Cardiovascular: Negative for chest pain, palpitations and leg swelling.  Gastrointestinal: Negative for abdominal pain and blood in stool.  Neurological: Negative for dizziness, light-headedness and headaches.    Objective:   Physical Exam  Constitutional: He is oriented to person, place, and time. He appears well-developed and well-nourished. No distress.  HENT:  Head: Normocephalic and atraumatic.  Eyes: Conjunctivae and EOM are normal. Pupils are equal, round, and reactive to light.  Neck: Neck supple. No JVD present. Carotid bruit is not present.  Cardiovascular: Normal rate and regular rhythm.   Murmur heard.  Systolic murmur is present with a grade of 2/6  Pulmonary/Chest: Effort normal and breath sounds normal. He has no rales.  Musculoskeletal: Normal  range of motion. He exhibits no edema.  Neurological: He is alert and oriented to person, place, and time.  Skin: Skin is warm and dry.  Psychiatric: He has a normal mood and affect. His behavior is normal.  Nursing note and vitals reviewed.   Filed Vitals:   08/26/15 1513  BP: 146/82  Pulse: 75  Temp: 98.3 F (36.8 C)  TempSrc: Oral  Resp: 16  Height: 5' 7.5" (1.715 m)  Weight: 236 lb 12.8 oz (107.412 kg)    Assessment & Plan:   Eisen Robenson is a 70 y.o. male Controlled type 2 diabetes mellitus with chronic kidney disease, without long-term current use of insulin, unspecified CKD stage ([place]) - Plan: Hemoglobin A1c  -Fasting lab visit in next 1 week. No changes in meds for now.    Need for prophylactic vaccination and  inoculation against influenza - Plan: Flu Vaccine QUAD 36+ mos IM given.   Elevated serum creatinine   recheck on upcoming labs.  likely due to obstructing stone prior.   Essential hypertension - Plan: COMPLETE METABOLIC PANEL WITH GFR, hydrochlorothiazide (HYDRODIURIL) 25 MG tablet, losartan (COZAAR) 100 MG tablet, metoprolol succinate (TOPROL-XL) 100 MG 24 hr tablet  -borderline, but home readings ok. No med changes for now.  Hyperlipidemia - Plan: Lipid panel, simvastatin (ZOCOR) 40 MG tablet  -labs pending.  Cont zocor for now at same dose.  Meds ordered this encounter  Medications  . hydrochlorothiazide (HYDRODIURIL) 25 MG tablet    Sig: Take 1 tablet (25 mg total) by mouth daily.    Dispense:  90 tablet    Refill:  1  . losartan (COZAAR) 100 MG tablet    Sig: Take 1 tablet (100 mg total) by mouth daily.    Dispense:  90 tablet    Refill:  1  . metoprolol succinate (TOPROL-XL) 100 MG 24 hr tablet    Sig: Take 1 tablet (100 mg total) by mouth daily. Take with or immediately following a meal.    Dispense:  90 tablet    Refill:  1  . simvastatin (ZOCOR) 40 MG tablet    Sig: Take 0.5 tablets (20 mg total) by mouth daily at 6 PM.    Dispense:  45  tablet    Refill:  1   Patient Instructions  Return for fasting labs in next week. You should receive a call or letter about your lab results within the next week to 10 days once blood drawn.  meds refilled for now, but if changes needed - will let you know.   Plan on follow up in 3 months.   Return to the clinic or go to the nearest emergency room if any of your symptoms worsen or new symptoms occur.      I personally performed the services described in this documentation, which was scribed in my presence. The recorded information has been reviewed and considered, and addended by me as needed.    By signing my name below, I, Raven Small, attest that this documentation has been prepared under the direction and in the presence of Timothy Ray, MD.  Electronically Signed: Thea Alken, ED Scribe. 08/26/2015. 4:09 PM.  .

## 2015-09-03 ENCOUNTER — Other Ambulatory Visit (INDEPENDENT_AMBULATORY_CARE_PROVIDER_SITE_OTHER): Payer: Medicare HMO

## 2015-09-03 DIAGNOSIS — I10 Essential (primary) hypertension: Secondary | ICD-10-CM | POA: Diagnosis not present

## 2015-09-03 DIAGNOSIS — R748 Abnormal levels of other serum enzymes: Secondary | ICD-10-CM

## 2015-09-03 DIAGNOSIS — E1122 Type 2 diabetes mellitus with diabetic chronic kidney disease: Secondary | ICD-10-CM

## 2015-09-03 DIAGNOSIS — R7989 Other specified abnormal findings of blood chemistry: Secondary | ICD-10-CM

## 2015-09-03 DIAGNOSIS — E785 Hyperlipidemia, unspecified: Secondary | ICD-10-CM

## 2015-09-03 LAB — COMPLETE METABOLIC PANEL WITH GFR
ALT: 23 U/L (ref 9–46)
AST: 24 U/L (ref 10–35)
Albumin: 4.3 g/dL (ref 3.6–5.1)
Alkaline Phosphatase: 45 U/L (ref 40–115)
BUN: 49 mg/dL — AB (ref 7–25)
CALCIUM: 9.8 mg/dL (ref 8.6–10.3)
CHLORIDE: 109 mmol/L (ref 98–110)
CO2: 20 mmol/L (ref 20–31)
Creat: 1.73 mg/dL — ABNORMAL HIGH (ref 0.70–1.25)
GFR, Est African American: 46 mL/min — ABNORMAL LOW (ref >=60)
GFR, Est Non African American: 40 mL/min — ABNORMAL LOW (ref >=60)
Glucose, Bld: 111 mg/dL — ABNORMAL HIGH (ref 65–99)
POTASSIUM: 3.9 mmol/L (ref 3.5–5.3)
SODIUM: 140 mmol/L (ref 135–146)
Total Bilirubin: 0.9 mg/dL (ref 0.2–1.2)
Total Protein: 7.4 g/dL (ref 6.1–8.1)

## 2015-09-03 LAB — LIPID PANEL
CHOL/HDL RATIO: 3.6 ratio (ref <=5.0)
CHOLESTEROL: 93 mg/dL — AB (ref 125–200)
HDL: 26 mg/dL — ABNORMAL LOW (ref >=40)
LDL CALC: 41 mg/dL (ref <130)
Triglycerides: 129 mg/dL (ref <150)
VLDL: 26 mg/dL (ref <30)

## 2015-09-03 LAB — HEMOGLOBIN A1C
Hgb A1c MFr Bld: 7.1 % — ABNORMAL HIGH (ref <5.7)
MEAN PLASMA GLUCOSE: 157 mg/dL — AB (ref <117)

## 2015-09-03 LAB — BASIC METABOLIC PANEL
BUN: 49 mg/dL — AB (ref 7–25)
CALCIUM: 9.8 mg/dL (ref 8.6–10.3)
CO2: 20 mmol/L (ref 20–31)
CREATININE: 1.73 mg/dL — AB (ref 0.70–1.25)
Chloride: 109 mmol/L (ref 98–110)
Glucose, Bld: 111 mg/dL — ABNORMAL HIGH (ref 65–99)
Potassium: 3.9 mmol/L (ref 3.5–5.3)
Sodium: 140 mmol/L (ref 135–146)

## 2015-09-03 NOTE — Progress Notes (Signed)
Pt is here for lab work only. 

## 2015-10-18 ENCOUNTER — Telehealth: Payer: Self-pay

## 2015-10-18 DIAGNOSIS — E1121 Type 2 diabetes mellitus with diabetic nephropathy: Secondary | ICD-10-CM

## 2015-10-18 DIAGNOSIS — Z794 Long term (current) use of insulin: Principal | ICD-10-CM

## 2015-10-18 MED ORDER — GLIMEPIRIDE 4 MG PO TABS: 4.0000 mg | ORAL_TABLET | Freq: Every day | ORAL | Status: DC

## 2015-10-18 NOTE — Telephone Encounter (Signed)
Rx sent in

## 2015-10-18 NOTE — Telephone Encounter (Signed)
Pt.notified

## 2015-10-18 NOTE — Telephone Encounter (Signed)
Timothy Martin - Pt needs a refill on his Glimepiride 4 mg.  He would like this called in to [facility] [address].  [phone]

## 2015-11-25 ENCOUNTER — Ambulatory Visit (INDEPENDENT_AMBULATORY_CARE_PROVIDER_SITE_OTHER): Payer: Medicare HMO | Admitting: Family Medicine

## 2015-11-25 ENCOUNTER — Telehealth: Payer: Self-pay | Admitting: *Deleted

## 2015-11-25 ENCOUNTER — Encounter: Payer: Self-pay | Admitting: Family Medicine

## 2015-11-25 VITALS — BP 140/80 | HR 77 | Temp 97.9°F | Resp 16 | Ht 67.25 in | Wt 236.2 lb

## 2015-11-25 DIAGNOSIS — R748 Abnormal levels of other serum enzymes: Secondary | ICD-10-CM | POA: Diagnosis not present

## 2015-11-25 DIAGNOSIS — R7989 Other specified abnormal findings of blood chemistry: Secondary | ICD-10-CM

## 2015-11-25 DIAGNOSIS — E119 Type 2 diabetes mellitus without complications: Secondary | ICD-10-CM | POA: Diagnosis not present

## 2015-11-25 DIAGNOSIS — I10 Essential (primary) hypertension: Secondary | ICD-10-CM

## 2015-11-25 NOTE — Telephone Encounter (Signed)
AVS mailed to pt.

## 2015-11-25 NOTE — Patient Instructions (Signed)
Return after Feb 1st for bloodwork, then follow up in about 3 months. Same doses of meds for now.  Can check into cost of shingles vaccine with your insurance company and let me know if new prescription needed.  Return to the clinic or go to the nearest emergency room if any of your symptoms worsen or new symptoms occur.

## 2015-11-25 NOTE — Progress Notes (Signed)
Subjective:  By signing my name below, I, Timothy Martin, attest that this documentation has been prepared under the direction and in the presence of [name], MD.  Electronically Signed: Thea Alken, ED Scribe. 11/25/2015. 3:28 PM.   Patient ID: Timothy Martin, male    DOB: [birth year], 69 y.o.   MRN: [id number]  HPI Chief Complaint  Patient presents with  . Follow-up    3 mos   HPI Comments: Timothy Martin is a 70 y.o. male who presents to the [facility] for a follow up.   Diabetes  Lab Results  Component Value Date   HGBA1C 7.1* 09/03/2015   Lab Results  Component Value Date   MICROALBUR 0.2 05/20/2015   He is taking amaryl 1m qd. No changes at november visit as borderline control. Lipid panel, CMP November overall normal except elevated creatine. He is on Zocor 226mfor lipids.  Blood sugar readings have been ranging from 125-140. He denies low readings below 100. He stays away from carbs and sweets and drinks 1 sweet tea a week.   Wt Readings from Last 3 Encounters:  11/25/15 236 lb 3.2 oz (107.14 kg)  08/26/15 236 lb 12.8 oz (107.412 kg)  06/18/15 239 lb (108.41 kg)   Eye care- last seen 02/2015 Dentist- pt does not have a dentist.  Foot exam- he had that performed today.   Hypertension  Lab Results  Component Value Date   CREATININE 1.73* 09/03/2015   CREATININE 1.73* 09/03/2015   He is on HTCZ, losartan and torprol. Home readings range from 120-150/70-80. He denies intolerance with medication.    Elevated creatine See prev visit. Hx of kidney stone 06/2015. He did have hydronephrosis with kidney stone. Treated by nephrologist, Dr. WrJeffie PollockLast creatine 1.73 up to a high 3.63 at the time of his kidney stone.    Health maintenance/Immunization  Immunization History  Administered Date(s) Administered  . Influenza Split 08/03/2013  . Influenza,inj,Quad PF,36+ Mos 08/03/2013, 11/16/2013, 07/19/2014, 08/26/2015  . Pneumococcal  Conjugate-13 07/19/2014  . Pneumococcal Polysaccharide-23 08/02/2012  . Td 11/02/2010   Shingle vaccine- has not had this due to cost.   Patient Active Problem List   Diagnosis Date Noted  . Obesity (BMI 30-39.9) 08/03/2013  . Hypertension 03/29/2013  . Type 2 diabetes mellitus ([place]/28/2014  . Elevated cholesterol 03/29/2013  . 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 Diabetes mellitus   . Hypertension   . Heart murmur   . Hyperlipidemia   . Kidney stone    Past Surgical History  Procedure Laterality Date  . Tonsillectomy    . Cystoscopy with retrograde pyelogram, ureteroscopy and stent placement Right 06/18/2015    Procedure: CYSTOSCOPY WITH RIGHT RETROGRADE PYELOGRAM, RIGHT URETEROSCOPY ;  Surgeon: JoIrine SealMD;  Location: [facility];  Service: Urology;  Laterality: Right;   Allergies  Allergen Reactions  . Codeine Other (See Comments)    CHILDHOOD  Allergic to all forms of Codeine   Jittery cannot function  . Augmentin [Amoxicillin-Pot Clavulanate] Rash   Prior to Admission medications   Medication Sig Start Date End Date Taking? Authorizing Provider  Alcohol Swabs PADS USE TO CHECK BLOOD SUGAR DAILY. DX CODE: 250.00 Patient not taking: Reported on 06/10/2015 05/18/13   HeCollene LeydenPA-C  Blood Glucose Monitoring Suppl (BLOOD GLUCOSE METER) kit Use to test blood sugar daily. Dx code: 250.00 05/18/13   HeCollene LeydenPA-C  fish oil-omega-3 fatty acids 1000  MG capsule Take 1 g by mouth 2 (two) times daily after a meal.    Historical Provider, MD  glimepiride (AMARYL) 4 MG tablet Take 1 tablet (4 mg total) by mouth daily with breakfast. 10/18/15   [name], MD  glucose blood test strip Use to check blood sugar daily. Dx code: 250.00 05/18/13   [name], PA-C  hydrochlorothiazide (HYDRODIURIL) 25 MG tablet Take 1 tablet (25 mg total) by mouth daily. 08/26/15   [name], MD  HYDROcodone-acetaminophen (NORCO) 5-325 MG  per tablet Take 1 tablet by mouth every 6 (six) hours as needed for moderate pain. Patient not taking: Reported on 08/26/2015 06/18/15   [name], MD  Lancets MISC Use to test blood sugar daily. Dx code:250.00 05/18/13   [name], PA-C  losartan (COZAAR) 100 MG tablet Take 1 tablet (100 mg total) by mouth daily. 08/26/15   [name], MD  metoprolol succinate (TOPROL-XL) 100 MG 24 hr tablet Take 1 tablet (100 mg total) by mouth daily. Take with or immediately following a meal. 08/26/15   [name], MD  simvastatin (ZOCOR) 40 MG tablet Take 0.5 tablets (20 mg total) by mouth daily at 6 PM. 08/26/15   [name], MD  tamsulosin (FLOMAX) 0.4 MG CAPS capsule Take 1 capsule (0.4 mg total) by mouth daily. 05/04/15   Quintella Reichert, MD  vitamin B-12 (CYANOCOBALAMIN) 100 MCG tablet Take 100 mcg by mouth every morning.     Historical Provider, MD   Social History   Social History  . Marital Status: Married    Spouse Name: N/A  . Number of Children: N/A  . Years of Education: N/A   Occupational History  . Not on file.   Social History Main Topics  . Smoking status: Never Smoker   . Smokeless tobacco: Not on file  . Alcohol Use: No  . Drug Use: No  . Sexual Activity: Not on file   Other Topics Concern  . Not on file   Social History Narrative   Review of Systems  Constitutional: Negative for fatigue and unexpected weight change.  Eyes: Negative for visual disturbance.  Respiratory: Negative for cough, chest tightness and shortness of breath.   Cardiovascular: Negative for chest pain, palpitations and leg swelling.  Gastrointestinal: Negative for abdominal pain and blood in stool.  Neurological: Negative for dizziness, light-headedness and headaches.    Objective:   Physical Exam  Constitutional: He is oriented to person, place, and time. He appears well-developed and well-nourished. No distress.  HENT:  Head: Normocephalic and atraumatic.  Eyes: Conjunctivae  and EOM are normal. Pupils are equal, round, and reactive to light.  Neck: Neck supple. No JVD present. Carotid bruit is not present.  Cardiovascular: Normal rate, regular rhythm and normal heart sounds.   No murmur heard. Pulmonary/Chest: Effort normal and breath sounds normal. He has no rales.  Musculoskeletal: Normal range of motion. He exhibits no edema.  Neurological: He is alert and oriented to person, place, and time.  Skin: Skin is warm and dry.  Psychiatric: He has a normal mood and affect. His behavior is normal.  Nursing note and vitals reviewed.  Filed Vitals:   11/25/15 1521  BP: 140/70  Pulse: 77  Temp: 97.9 F (36.6 C)  TempSrc: Oral  Resp: 16  Height: 5' 7.25" (1.708 m)  Weight: 236 lb 3.2 oz (107.14 kg)  SpO2: 97%   Assessment & Plan:  Timothy Martin is a 70  y.o. male Type 2 diabetes mellitus without complication, without long-term current use of insulin ([place]) - Plan: HM Diabetes Foot Exam, Hemoglobin A1c  - no med changes for now. Plan on A1c and bmp at lab only visit in few weeks.   Essential hypertension - Plan: Basic metabolic panel  -stable. BMP pending, no med changes for now.   Elevated serum creatinine - Plan: Basic metabolic panel  -BMP pending. Stay hydrated, avoid nephrotoxins otc - specifically NSAIDS.    Health maintenance -  -shingles vaccine discussed.  Cost prohibitive, so declined.   No orders of the defined types were placed in this encounter.   Patient Instructions  Return after Feb 1st for bloodwork, then follow up in about 3 months. Same doses of meds for now.  Can check into cost of shingles vaccine with your insurance company and let me know if new prescription needed.  Return to the clinic or go to the nearest emergency room if any of your symptoms worsen or new symptoms occur.     I personally performed the services described in this documentation, which was scribed in my presence. The recorded information has been reviewed and  considered, and addended by me as needed.

## 2015-12-26 ENCOUNTER — Other Ambulatory Visit (INDEPENDENT_AMBULATORY_CARE_PROVIDER_SITE_OTHER): Payer: Medicare HMO

## 2015-12-26 DIAGNOSIS — R7989 Other specified abnormal findings of blood chemistry: Secondary | ICD-10-CM

## 2015-12-26 DIAGNOSIS — I10 Essential (primary) hypertension: Secondary | ICD-10-CM

## 2015-12-26 DIAGNOSIS — E119 Type 2 diabetes mellitus without complications: Secondary | ICD-10-CM

## 2015-12-27 LAB — BASIC METABOLIC PANEL
BUN: 39 mg/dL — ABNORMAL HIGH (ref 7–25)
CALCIUM: 9.1 mg/dL (ref 8.6–10.3)
CO2: 22 mmol/L (ref 20–31)
CREATININE: 1.62 mg/dL — AB (ref 0.70–1.25)
Chloride: 105 mmol/L (ref 98–110)
Glucose, Bld: 218 mg/dL — ABNORMAL HIGH (ref 65–99)
Potassium: 4.3 mmol/L (ref 3.5–5.3)
SODIUM: 140 mmol/L (ref 135–146)

## 2015-12-27 LAB — HEMOGLOBIN A1C
HEMOGLOBIN A1C: 6.9 % — AB (ref <5.7)
MEAN PLASMA GLUCOSE: 151 mg/dL — AB (ref <117)

## 2016-02-26 ENCOUNTER — Ambulatory Visit (INDEPENDENT_AMBULATORY_CARE_PROVIDER_SITE_OTHER): Payer: Medicare HMO | Admitting: Family Medicine

## 2016-02-26 ENCOUNTER — Encounter: Payer: Self-pay | Admitting: Family Medicine

## 2016-02-26 VITALS — BP 132/80 | HR 70 | Temp 98.2°F | Resp 16 | Ht 67.5 in | Wt 240.8 lb

## 2016-02-26 DIAGNOSIS — E785 Hyperlipidemia, unspecified: Secondary | ICD-10-CM | POA: Diagnosis not present

## 2016-02-26 DIAGNOSIS — E1121 Type 2 diabetes mellitus with diabetic nephropathy: Secondary | ICD-10-CM

## 2016-02-26 DIAGNOSIS — R748 Abnormal levels of other serum enzymes: Secondary | ICD-10-CM | POA: Diagnosis not present

## 2016-02-26 DIAGNOSIS — I10 Essential (primary) hypertension: Secondary | ICD-10-CM

## 2016-02-26 DIAGNOSIS — R7989 Other specified abnormal findings of blood chemistry: Secondary | ICD-10-CM

## 2016-02-26 LAB — BASIC METABOLIC PANEL WITH GFR
BUN: 37 mg/dL — ABNORMAL HIGH (ref 7–25)
CALCIUM: 9.5 mg/dL (ref 8.6–10.3)
CO2: 23 mmol/L (ref 20–31)
Chloride: 102 mmol/L (ref 98–110)
Creat: 1.51 mg/dL — ABNORMAL HIGH (ref 0.70–1.25)
GFR, EST AFRICAN AMERICAN: 54 mL/min — AB (ref >=60)
GFR, EST NON AFRICAN AMERICAN: 46 mL/min — AB (ref >=60)
Glucose, Bld: 106 mg/dL — ABNORMAL HIGH (ref 65–99)
Potassium: 3.7 mmol/L (ref 3.5–5.3)
SODIUM: 139 mmol/L (ref 135–146)

## 2016-02-26 MED ORDER — METOPROLOL SUCCINATE ER 100 MG PO TB24: 100.0000 mg | ORAL_TABLET | Freq: Every day | ORAL | Status: DC

## 2016-02-26 MED ORDER — GLIMEPIRIDE 4 MG PO TABS
4.0000 mg | ORAL_TABLET | Freq: Every day | ORAL | Status: DC
Start: 2016-02-26 — End: 2016-08-27

## 2016-02-26 MED ORDER — LANCETS MISC: Status: DC

## 2016-02-26 MED ORDER — ALCOHOL SWABS PADS
MEDICATED_PAD | Status: AC
Start: 2016-02-26 — End: ?

## 2016-02-26 MED ORDER — LOSARTAN POTASSIUM-HCTZ 100-25 MG PO TABS: 1.0000 | ORAL_TABLET | Freq: Every day | ORAL | Status: DC

## 2016-02-26 MED ORDER — SIMVASTATIN 40 MG PO TABS
20.0000 mg | ORAL_TABLET | Freq: Every day | ORAL | Status: DC
Start: 2016-02-26 — End: 2016-08-27

## 2016-02-26 NOTE — Progress Notes (Addendum)
Subjective:  By signing my name below, I, Timothy Martin, attest that this documentation has been prepared under the direction and in the presence of Timothy Ray, MD. Electronically Signed: Moises Martin, [place]. 02/26/2016 , 2:53 PM .  Patient was seen in Room 25 .   Patient ID: Timothy Martin, male    DOB: 07-12-1946, 70 y.o.   MRN: [id number] Chief Complaint  Patient presents with  . Follow-up  . Diabetes  . Medication Refill    all meds   HPI Timothy Martin is a 70 y.o. male Here for follow up. H/o HTN, and DM. H/o kidney stone aug 2016 with hydronephrosis and elevated creatinine. He is not fasting today.   DM Lab Results  Component Value Date   HGBA1C 6.9* 12/26/2015   Lab Results  Component Value Date   MICROALBUR 0.2 05/20/2015   He is on amaryl '4mg'$  qd. He denies any symptomatic lows. His sugar has been running well.   HTN He's on 3 medications: takes HCTZ '25mg'$  qd, losartan '100mg'$  qd, and toprol '100mg'$  qd. He denies any complications with his medications. He denies chest pain, difficulty breathing, dizziness or lightheadedness.   Lab Results  Component Value Date   CREATININE 1.62* 12/26/2015   Elevated Creatinine Peak level 3.63, down to 1.62 on February 23rd.   HLD Lab Results  Component Value Date   CHOL 93* 09/03/2015   HDL 26* 09/03/2015   LDLCALC 41 09/03/2015   TRIG 129 09/03/2015   CHOLHDL 3.6 09/03/2015   Lab Results  Component Value Date   ALT 23 09/03/2015   AST 24 09/03/2015   ALKPHOS 45 09/03/2015   BILITOT 0.9 09/03/2015   He was continued on zocor '20mg'$  qd.   Patient Active Problem List   Diagnosis Date Noted  . Obesity (BMI 30-39.9) 08/03/2013  . Hypertension 03/29/2013  . Type 2 diabetes mellitus ([place]) 03/29/2013  . Elevated cholesterol 03/29/2013  . 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 Diabetes mellitus   . Hypertension   . Heart murmur   . Hyperlipidemia   . Kidney stone    Past  Surgical History  Procedure Laterality Date  . Tonsillectomy    . Cystoscopy with retrograde pyelogram, ureteroscopy and stent placement Right 06/18/2015    Procedure: CYSTOSCOPY WITH RIGHT RETROGRADE PYELOGRAM, RIGHT URETEROSCOPY ;  Surgeon: [name], MD;  Location: [facility];  Service: Urology;  Laterality: Right;   Allergies  Allergen Reactions  . Codeine Other (See Comments)    CHILDHOOD  Allergic to all forms of Codeine   Jittery cannot function  . Augmentin [Amoxicillin-Pot Clavulanate] Rash   Prior to Admission medications   Medication Sig Start Date End Date Taking? Authorizing Provider  Alcohol Swabs PADS USE TO CHECK Martin SUGAR DAILY. DX CODE: 250.00 05/18/13   [name], PA-C  Martin Glucose Monitoring Suppl (Martin GLUCOSE METER) kit Use to test Martin sugar daily. Dx code: 250.00 05/18/13   [name], PA-C  fish oil-omega-3 fatty acids 1000 MG capsule Take 1 g by mouth 2 (two) times daily after a meal.    Historical Provider, MD  glimepiride (AMARYL) 4 MG tablet Take 1 tablet (4 mg total) by mouth daily with breakfast. 10/18/15   [name], MD  glucose Martin test strip Use to check Martin sugar daily. Dx code: 250.00 05/18/13   [name], PA-C  hydrochlorothiazide (HYDRODIURIL) 25 MG tablet Take 1 tablet (25 mg total) by  mouth daily. 08/26/15   [name], MD  Lancets MISC Use to test Martin sugar daily. Dx code:250.00 05/18/13   [name], PA-C  losartan (COZAAR) 100 MG tablet Take 1 tablet (100 mg total) by mouth daily. 08/26/15   [name], MD  metoprolol succinate (TOPROL-XL) 100 MG 24 hr tablet Take 1 tablet (100 mg total) by mouth daily. Take with or immediately following a meal. 08/26/15   [name], MD  simvastatin (ZOCOR) 40 MG tablet Take 0.5 tablets (20 mg total) by mouth daily at 6 PM. 08/26/15   [name], MD  vitamin B-12 (CYANOCOBALAMIN) 100 MCG tablet Take 100 mcg by mouth every morning.     Historical Provider, MD     Social History   Social History  . Marital Status: Married    Spouse Name: N/A  . Number of Children: N/A  . Years of Education: N/A   Occupational History  . Not on file.   Social History Main Topics  . Smoking status: Never Smoker   . Smokeless tobacco: Not on file  . Alcohol Use: No  . Drug Use: No  . Sexual Activity: Not on file   Other Topics Concern  . Not on file   Social History Narrative   Review of Systems  Constitutional: Negative for fatigue and unexpected weight change.  Eyes: Negative for visual disturbance.  Respiratory: Negative for cough, chest tightness and shortness of breath.   Cardiovascular: Negative for chest pain, palpitations and leg swelling.  Gastrointestinal: Negative for abdominal pain and Martin in stool.  Neurological: Negative for dizziness, light-headedness and headaches.      Objective:   Physical Exam  Constitutional: He is oriented to person, place, and time. He appears well-developed and well-nourished.  HENT:  Head: Normocephalic and atraumatic.  Eyes: EOM are normal. Pupils are equal, round, and reactive to light.  Neck: No JVD present. Carotid bruit is not present.  Cardiovascular: Normal rate, regular rhythm and normal heart sounds.   No murmur heard. Pulmonary/Chest: Effort normal and breath sounds normal. He has no rales.  Musculoskeletal: He exhibits no edema.  Neurological: He is alert and oriented to person, place, and time.  Skin: Skin is warm and dry.  Psychiatric: He has a normal mood and affect.  Vitals reviewed.   Filed Vitals:   02/26/16 1402  BP: 132/80  Pulse: 70  Temp: 98.2 F (36.8 C)  TempSrc: Oral  Resp: 16  Height: 5' 7.5" (1.715 m)  Weight: 240 lb 12.8 oz (109.226 kg)  SpO2: 98%      Assessment & Plan:   Timothy Martin is a 70 y.o. male Controlled type 2 diabetes mellitus with diabetic nephropathy, without long-term current use of insulin ([place]) - Plan: BASIC METABOLIC PANEL WITH GFR Type  2 diabetes mellitus with diabetic nephropathy, without long-term current use of insulin ([facility]) - Plan: glimepiride (AMARYL) 4 MG tablet, Alcohol Swabs PADS, Lancets MISC  - most recent level controlled. Continue same regimen, plan on repeat A1c in 3 months. Will be due for lipid testing as well.   Essential hypertension - Plan: BASIC METABOLIC PANEL WITH GFR, metoprolol succinate (TOPROL-XL) 100 MG 24 hr tablet, losartan-hydrochlorothiazide (HYZAAR) 100-25 MG tablet  - stable. Changed losartan hctz to combo, but no med changes.   Elevated serum creatinine - Plan: BASIC METABOLIC PANEL WITH GFR  - improving 2 months ago - repeat today. Avoid nsaids and maintain hydration.   Hyperlipidemia - Plan: simvastatin (  ZOCOR) 40 MG tablet  -cont zocor - controlled in 09/2015. Not fasting. Plan on repeat lipids next ov. In 3 months.   Meds ordered this encounter  Medications  . simvastatin (ZOCOR) 40 MG tablet    Sig: Take 0.5 tablets (20 mg total) by mouth daily at 6 PM.    Dispense:  45 tablet    Refill:  1  . metoprolol succinate (TOPROL-XL) 100 MG 24 hr tablet    Sig: Take 1 tablet (100 mg total) by mouth daily. Take with or immediately following a meal.    Dispense:  90 tablet    Refill:  1  . losartan-hydrochlorothiazide (HYZAAR) 100-25 MG tablet    Sig: Take 1 tablet by mouth daily.    Dispense:  90 tablet    Refill:  1  . glimepiride (AMARYL) 4 MG tablet    Sig: Take 1 tablet (4 mg total) by mouth daily with breakfast.    Dispense:  90 tablet    Refill:  1  . Alcohol Swabs PADS    Sig: USE TO CHECK Martin SUGAR DAILY. DX CODE: 250.00    Dispense:  100 each    Refill:  3    BD SINGLE USE SWABS  . Lancets MISC    Sig: Use to test Martin sugar daily. Dx code:250.00    Dispense:  68 each    Refill:  3    ACCU-CHEK FASTCLIX, diabetes type 2, controlled with nephropathy. Check once per day.   Patient Instructions       IF you received an x-Martin today, you will receive an invoice  from [facility]. Please contact [facility] at [phone] with questions or concerns regarding your invoice.   IF you received labwork today, you will receive an invoice from [employer]. Please contact [facility] at [phone] with questions or concerns regarding your invoice.   Our billing staff will not be able to assist you with questions regarding bills from these companies.  You will be contacted with the lab results as soon as they are available. The fastest way to get your results is to activate your My Chart account. Instructions are located on the last page of this paperwork. If you have not heard from [name] regarding the results in 2 weeks, please contact this office.    No change in meds for now. Plan on repeat diabetes test at next visit in 3 months. I will check kidney function today. Continue to avoid NSAIDS like Advil and Alleve, and stay hydrated this summer.   Return to the clinic or go to the nearest emergency room if any of your symptoms worsen or new symptoms occur.     I personally performed the services described in this documentation, which was scribed in my presence. The recorded information has been reviewed and considered, and addended by me as needed.

## 2016-02-26 NOTE — Patient Instructions (Addendum)
     IF you received an x-ray today, you will receive [facility]. Please contact [facility] at [phone] with questions or concerns regarding your invoice.   IF you received labwork today, you will receive an invoice from [facility]. Please contact [facility] at [phone] with questions or concerns regarding your invoice.   Our billing staff will not be able to assist you with questions regarding bills from these companies.  You will be contacted with the lab results as soon as they are available. The fastest way to get your results is to activate your My Chart account. Instructions are located on the last page of this paperwork. If you have not heard from us regarding the results in 2 weeks, please contact this office.    No change in meds for now. Plan on repeat diabetes test at next visit in 3 months. I will check kidney function today. Continue to avoid NSAIDS like Advil and Alleve, and stay hydrated this summer.   Return to the clinic or go to the nearest emergency room if any of your symptoms worsen or new symptoms occur.

## 2016-05-27 ENCOUNTER — Ambulatory Visit: Payer: Medicare HMO | Admitting: Family Medicine

## 2016-05-28 ENCOUNTER — Ambulatory Visit (INDEPENDENT_AMBULATORY_CARE_PROVIDER_SITE_OTHER): Payer: Medicare HMO | Admitting: Family Medicine

## 2016-05-28 ENCOUNTER — Encounter: Payer: Self-pay | Admitting: Family Medicine

## 2016-05-28 VITALS — BP 120/72 | HR 71 | Temp 97.8°F | Resp 18 | Ht 67.5 in | Wt 238.4 lb

## 2016-05-28 DIAGNOSIS — E785 Hyperlipidemia, unspecified: Secondary | ICD-10-CM

## 2016-05-28 DIAGNOSIS — E1121 Type 2 diabetes mellitus with diabetic nephropathy: Secondary | ICD-10-CM | POA: Diagnosis not present

## 2016-05-28 DIAGNOSIS — I10 Essential (primary) hypertension: Secondary | ICD-10-CM

## 2016-05-28 NOTE — Progress Notes (Signed)
By signing my name below, I, Timothy Martin, attest that this documentation has been prepared under the direction and in the presence of Timothy Ray, MD.  Electronically Signed: Verlee Martin, Medical Scribe. 05/28/16. 5:21 PM.  Subjective:    Patient ID: Timothy Martin, male    DOB: Nov 02, 1946, 70 y.o.   MRN: [id number]  HPI No chief complaint on file.   HPI Comments: Timothy Martin is a 70 y.o. male who presents to the [facility] for follow-up. His last visit with me was April 26th. Pt last ate around 11:30 am today. Pt denies difficulty urinating, and hematuria.  DM: Had been controlled. He was too early for A1c level check at last office visit. Was continued on Amaryl 4 gm QD. Pt states his blood sugar reads around 145- no symptomatic lows. Pt states his ophthalmologist told him he has cataracts, which he's had for years, and retinopathy in one of his eye, there was a lot of red in it.  Pt states his ophthalmologist was in the first week of July. Pt ophthalmologist Pt states he has nystagmus in his eye. Pt reports experiencing episodic neuorpathy that last 30 seconds. Pt exercises about 5 days a week. Lab Results  Component Value Date   HGBA1C 6.9 (H) 12/26/2015   Lab Results  Component Value Date   MICROALBUR 0.2 05/20/2015   Wt Readings from Last 3 Encounters:  05/28/16 238 lb 6.4 oz (108.1 kg)  02/26/16 240 lb 12.8 oz (109.2 kg)  11/25/15 236 lb 3.2 oz (107.1 kg)   HTN: Takes Losartan-HCTZ regularly. Pt denies experiencing light-headedness and dizziness while on his medication.  Lab Results  Component Value Date   CREATININE 1.51 (H) 02/26/2016   HLD: Takes 20 mg of Simvastatin. Lab Results  Component Value Date   ALT 23 09/03/2015   AST 24 09/03/2015   ALKPHOS 45 09/03/2015   BILITOT 0.9 09/03/2015   Lab Results  Component Value Date   CHOL 93 (L) 09/03/2015   HDL 26 (L) 09/03/2015   LDLCALC 41 09/03/2015   TRIG 129 09/03/2015   CHOLHDL  3.6 09/03/2015    Patient Active Problem List   Diagnosis Date Noted  . Obesity (BMI 30-39.9) 08/03/2013  . Hypertension 03/29/2013  . Type 2 diabetes mellitus ([place]) 03/29/2013  . Elevated cholesterol 03/29/2013  . 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 Diabetes mellitus   . Heart murmur   . Hyperlipidemia   . Hypertension   . Kidney stone    Past Surgical History:  Procedure Laterality Date  . CYSTOSCOPY WITH RETROGRADE PYELOGRAM, URETEROSCOPY AND STENT PLACEMENT Right 06/18/2015   Procedure: CYSTOSCOPY WITH RIGHT RETROGRADE PYELOGRAM, RIGHT URETEROSCOPY ;  Surgeon: [name], MD;  Location: [facility];  Service: Urology;  Laterality: Right;  . TONSILLECTOMY     Allergies  Allergen Reactions  . Codeine Other (See Comments)    CHILDHOOD  Allergic to all forms of Codeine   Jittery cannot function  . Augmentin [Amoxicillin-Pot Clavulanate] Rash   Prior to Admission medications   Medication Sig Start Date End Date Taking? Authorizing Provider  Alcohol Swabs PADS USE TO CHECK BLOOD SUGAR DAILY. DX CODE: 250.00 02/26/16  Yes [name], MD  Blood Glucose Monitoring Suppl (BLOOD GLUCOSE METER) kit Use to test blood sugar daily. Dx code: 250.00 05/18/13  Yes Heather Elnora Morrison, PA-C  fish oil-omega-3 fatty acids 1000 MG capsule Take 1 g by mouth 2 (two) times  daily after a meal.   Yes Historical Provider, MD  glimepiride (AMARYL) 4 MG tablet Take 1 tablet (4 mg total) by mouth daily with breakfast. 02/26/16  Yes [name], MD  Lancets MISC Use to test blood sugar daily. Dx code:250.00 02/26/16  Yes [name], MD  losartan-hydrochlorothiazide (HYZAAR) 100-25 MG tablet Take 1 tablet by mouth daily. 02/26/16  Yes [name], MD  metoprolol succinate (TOPROL-XL) 100 MG 24 hr tablet Take 1 tablet (100 mg total) by mouth daily. Take with or immediately following a meal. 02/26/16  Yes [name], MD  simvastatin (ZOCOR) 40 MG tablet  Take 0.5 tablets (20 mg total) by mouth daily at 6 PM. 02/26/16  Yes [name], MD  vitamin B-12 (CYANOCOBALAMIN) 100 MCG tablet Take 100 mcg by mouth every morning.    Yes Historical Provider, MD   Social History   Social History  . Marital status: Married    Spouse name: N/A  . Number of children: N/A  . Years of education: N/A   Occupational History  . Not on file.   Social History Main Topics  . Smoking status: Never Smoker  . Smokeless tobacco: Not on file  . Alcohol use No  . Drug use: No  . Sexual activity: Not on file   Other Topics Concern  . Not on file   Social History Narrative  . No narrative on file   Review of Systems  Respiratory: Negative for shortness of breath.   Genitourinary: Negative for difficulty urinating and hematuria.  Neurological: Negative for dizziness and light-headedness.    Objective:  Physical Exam  Constitutional: He is oriented to person, place, and time. He appears well-developed and well-nourished.  HENT:  Head: Normocephalic and atraumatic.  Eyes: EOM are normal. Pupils are equal, round, and reactive to light.  Neck: No JVD present. Carotid bruit is not present.  Cardiovascular: Normal rate, regular rhythm and normal heart sounds.   No murmur heard. Pulmonary/Chest: Effort normal and breath sounds normal. He has no rales.  Musculoskeletal: He exhibits no edema.  Neurological: He is alert and oriented to person, place, and time.  Microfilaments testing nl  Skin: Skin is warm and dry.  Thicked callous at the first MTP, but no wounds  Psychiatric: He has a normal mood and affect.  Vitals reviewed.  BP 120/72   Pulse 71   Temp 97.8 F (36.6 C) (Oral)   Resp 18   Ht 5' 7.5" (1.715 m)   Wt 238 lb 6.4 oz (108.1 kg)   SpO2 98%   BMI 36.79 kg/m  Assessment & Plan:  [name] is a 70 y.o. male Controlled type 2 diabetes mellitus with diabetic nephropathy, without long-term current use of insulin ([place]) - Plan:  Hemoglobin A1C, Microalbumin, urine  -Prior controlled. No change in medications for now, A1c pending.  Essential hypertension - Plan: COMPLETE METABOLIC PANEL WITH GFR  -Stable. Tolerating medications, no change in meds for now. CMP pending.  Hyperlipidemia - Plan: COMPLETE METABOLIC PANEL WITH GFR, Lipid panel  -Labs pending. Tolerating Zocor 20 mg dose. No changes for now.  No orders of the defined types were placed in this encounter.  Patient Instructions   Your diabetes was controlled when we last checked. I will check the A1c again today as well as your kidney, liver tests, and cholesterol test. No change in medications for now. Follow-up with me in 3 months.    IF you received an x-Martin  today, you will receive an invoice from [facility]. Please contact [facility] at [phone] with questions or concerns regarding your invoice.   IF you received labwork today, you will receive an invoice from [employer]. Please contact [facility] at [phone] with questions or concerns regarding your invoice.   Our billing staff will not be able to assist you with questions regarding bills from these companies.  You will be contacted with the lab results as soon as they are available. The fastest way to get your results is to activate your My Chart account. Instructions are located on the last page of this paperwork. If you have not heard from [name] regarding the results in 2 weeks, please contact this office.        I personally performed the services described in this documentation, which was scribed in my presence. The recorded information has been reviewed and considered, and addended by me as needed.   Signed,   Timothy Ray, MD [facility].  05/30/16 5:53 PM

## 2016-05-28 NOTE — Patient Instructions (Addendum)
Your diabetes was controlled when we last checked. I will check the A1c again today as well as your kidney, liver tests, and cholesterol test. No change in medications for now. Follow-up with me in 3 months.    IF you received an x-ray today, you will receive an invoice from [facility]. Please contact [facility] at [phone] with questions or concerns regarding your invoice.   IF you received labwork today, you will receive an invoice from [employer]. Please contact [facility] at [phone] with questions or concerns regarding your invoice.   Our billing staff will not be able to assist you with questions regarding bills from these companies.  You will be contacted with the lab results as soon as they are available. The fastest way to get your results is to activate your My Chart account. Instructions are located on the last page of this paperwork. If you have not heard from [name] regarding the results in 2 weeks, please contact this office.

## 2016-05-29 LAB — COMPLETE METABOLIC PANEL WITH GFR
ALK PHOS: 43 U/L (ref 40–115)
ALT: 21 U/L (ref 9–46)
AST: 25 U/L (ref 10–35)
Albumin: 4.7 g/dL (ref 3.6–5.1)
BUN: 41 mg/dL — AB (ref 7–25)
CHLORIDE: 107 mmol/L (ref 98–110)
CO2: 23 mmol/L (ref 20–31)
Calcium: 9.9 mg/dL (ref 8.6–10.3)
Creat: 1.65 mg/dL — ABNORMAL HIGH (ref 0.70–1.25)
GFR, EST NON AFRICAN AMERICAN: 42 mL/min — AB (ref >=60)
GFR, Est African American: 48 mL/min — ABNORMAL LOW (ref >=60)
GLUCOSE: 77 mg/dL (ref 65–99)
POTASSIUM: 4.2 mmol/L (ref 3.5–5.3)
SODIUM: 142 mmol/L (ref 135–146)
Total Bilirubin: 0.8 mg/dL (ref 0.2–1.2)
Total Protein: 7.9 g/dL (ref 6.1–8.1)

## 2016-05-29 LAB — HEMOGLOBIN A1C
HEMOGLOBIN A1C: 7.1 % — AB (ref <5.7)
MEAN PLASMA GLUCOSE: 157 mg/dL

## 2016-05-29 LAB — LIPID PANEL
CHOL/HDL RATIO: 5.1 ratio — AB (ref <=5.0)
Cholesterol: 154 mg/dL (ref 125–200)
HDL: 30 mg/dL — AB (ref >=40)
LDL Cholesterol: 56 mg/dL (ref <130)
Triglycerides: 342 mg/dL — ABNORMAL HIGH (ref <150)
VLDL: 68 mg/dL — AB (ref <30)

## 2016-06-01 LAB — MICROALBUMIN, URINE: MICROALB UR: 1.2 mg/dL

## 2016-06-03 ENCOUNTER — Encounter: Payer: Self-pay | Admitting: *Deleted

## 2016-08-27 ENCOUNTER — Ambulatory Visit (INDEPENDENT_AMBULATORY_CARE_PROVIDER_SITE_OTHER): Payer: Medicare HMO | Admitting: Family Medicine

## 2016-08-27 ENCOUNTER — Encounter: Payer: Self-pay | Admitting: Family Medicine

## 2016-08-27 VITALS — BP 118/72 | HR 78 | Temp 97.7°F | Resp 16 | Wt 240.0 lb

## 2016-08-27 DIAGNOSIS — E1121 Type 2 diabetes mellitus with diabetic nephropathy: Secondary | ICD-10-CM | POA: Diagnosis not present

## 2016-08-27 DIAGNOSIS — Z23 Encounter for immunization: Secondary | ICD-10-CM | POA: Diagnosis not present

## 2016-08-27 DIAGNOSIS — I10 Essential (primary) hypertension: Secondary | ICD-10-CM

## 2016-08-27 DIAGNOSIS — E782 Mixed hyperlipidemia: Secondary | ICD-10-CM

## 2016-08-27 LAB — BASIC METABOLIC PANEL
BUN: 36 mg/dL — AB (ref 7–25)
CHLORIDE: 104 mmol/L (ref 98–110)
CO2: 23 mmol/L (ref 20–31)
Calcium: 9.6 mg/dL (ref 8.6–10.3)
Creat: 1.77 mg/dL — ABNORMAL HIGH (ref 0.70–1.25)
GLUCOSE: 148 mg/dL — AB (ref 65–99)
POTASSIUM: 4 mmol/L (ref 3.5–5.3)
SODIUM: 140 mmol/L (ref 135–146)

## 2016-08-27 LAB — HEMOGLOBIN A1C
HEMOGLOBIN A1C: 7.3 % — AB (ref <5.7)
MEAN PLASMA GLUCOSE: 163 mg/dL

## 2016-08-27 MED ORDER — GLIMEPIRIDE 4 MG PO TABS
4.0000 mg | ORAL_TABLET | Freq: Every day | ORAL | 1 refills | Status: DC
Start: 2016-08-27 — End: 2017-03-04

## 2016-08-27 MED ORDER — METOPROLOL SUCCINATE ER 100 MG PO TB24: 100.0000 mg | ORAL_TABLET | Freq: Every day | ORAL | 1 refills | Status: DC

## 2016-08-27 MED ORDER — LOSARTAN POTASSIUM-HCTZ 100-25 MG PO TABS: 1.0000 | ORAL_TABLET | Freq: Every day | ORAL | 1 refills | Status: DC

## 2016-08-27 MED ORDER — SIMVASTATIN 20 MG PO TABS: 20.0000 mg | ORAL_TABLET | Freq: Every day | ORAL | 1 refills | Status: DC

## 2016-08-27 NOTE — Progress Notes (Signed)
Subjective:    Patient ID: Timothy Martin, male    DOB: [birth year], 71 y.o.   MRN: [id number]  HPI Timothy Martin is a 70 y.o. male  Diabetes With diabetic nephropathy.: Takes Amaryl 4 mg daily. Not on metformin with underlying kidney disease. Last optho visit in March, unknown if retinopathy.  No recent dental visit.  No symptomatic lows.  Home readings - 90-140.  Receiving flu shot today.  Lab Results  Component Value Date   HGBA1C 7.1 (H) 05/28/2016   Lab Results  Component Value Date   MICROALBUR 1.2 05/28/2016   Hyperlipidemia. Takes Zocor 40 mg - 1/2 pill per day. Some difficulty splitting it.  Lab Results  Component Value Date   CHOL 154 05/28/2016   HDL 30 (L) 05/28/2016   LDLCALC 56 05/28/2016   TRIG 342 (H) 05/28/2016   CHOLHDL 5.1 (H) 05/28/2016   Lab Results  Component Value Date   ALT 21 05/28/2016   AST 25 05/28/2016   ALKPHOS 43 05/28/2016   BILITOT 0.8 05/28/2016   Hypertension, with elevated creatinine. Component of diabetic nephropathy, previous significant elevated creatinine due to kidney stone. He takes losartan HC 100/25 mg daily. Toprol-XL 100 mg daily, Lab Results  Component Value Date   CREATININE 1.65 (H) 05/28/2016  avoiding nsaids.    Patient Active Problem List   Diagnosis Date Noted  . Obesity (BMI 30-39.9) 08/03/2013  . Hypertension 03/29/2013  . Type 2 diabetes mellitus ([place]) 03/29/2013  . Elevated cholesterol 03/29/2013  . 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 Diabetes mellitus   . Heart murmur   . Hyperlipidemia   . Hypertension   . Kidney stone    Past Surgical History:  Procedure Laterality Date  . CYSTOSCOPY WITH RETROGRADE PYELOGRAM, URETEROSCOPY AND STENT PLACEMENT Right 06/18/2015   Procedure: CYSTOSCOPY WITH RIGHT RETROGRADE PYELOGRAM, RIGHT URETEROSCOPY ;  Surgeon: [name], MD;  Location: [facility];  Service: Urology;  Laterality: Right;  . TONSILLECTOMY     Allergies    Allergen Reactions  . Codeine Other (See Comments)    CHILDHOOD  Allergic to all forms of Codeine   Jittery cannot function  . Augmentin [Amoxicillin-Pot Clavulanate] Rash   Prior to Admission medications   Medication Sig Start Date End Date Taking? Authorizing Provider  Alcohol Swabs PADS USE TO CHECK BLOOD SUGAR DAILY. DX CODE: 250.00 02/26/16  Yes [name], MD  Blood Glucose Monitoring Suppl (BLOOD GLUCOSE METER) kit Use to test blood sugar daily. Dx code: 250.00 05/18/13  Yes Heather Elnora Morrison, PA-C  fish oil-omega-3 fatty acids 1000 MG capsule Take 1 g by mouth 2 (two) times daily after a meal.   Yes Historical Provider, MD  glimepiride (AMARYL) 4 MG tablet Take 1 tablet (4 mg total) by mouth daily with breakfast. 02/26/16  Yes [name], MD  Lancets MISC Use to test blood sugar daily. Dx code:250.00 02/26/16  Yes [name], MD  losartan-hydrochlorothiazide (HYZAAR) 100-25 MG tablet Take 1 tablet by mouth daily. 02/26/16  Yes [name], MD  metoprolol succinate (TOPROL-XL) 100 MG 24 hr tablet Take 1 tablet (100 mg total) by mouth daily. Take with or immediately following a meal. 02/26/16  Yes [name], MD  simvastatin (ZOCOR) 40 MG tablet Take 0.5 tablets (20 mg total) by mouth daily at 6 PM. 02/26/16  Yes [name], MD  vitamin B-12 (CYANOCOBALAMIN) 100 MCG tablet Take 100 mcg by mouth  every morning.    Yes Historical Provider, MD   Social History   Social History  . Marital status: Married    Spouse name: N/A  . Number of children: N/A  . Years of education: N/A   Occupational History  . Not on file.   Social History Main Topics  . Smoking status: Never Smoker  . Smokeless tobacco: Not on file  . Alcohol use No  . Drug use: No  . Sexual activity: Not on file   Other Topics Concern  . Not on file   Social History Narrative  . No narrative on file      Review of Systems  Constitutional: Negative for fatigue and unexpected weight  change.  Eyes: Negative for visual disturbance.  Respiratory: Negative for cough, chest tightness and shortness of breath.   Cardiovascular: Negative for chest pain, palpitations and leg swelling.  Gastrointestinal: Negative for abdominal pain and blood in stool.  Neurological: Negative for dizziness, light-headedness and headaches.        Objective:   Physical Exam  Constitutional: He is oriented to person, place, and time. He appears well-developed and well-nourished.  HENT:  Head: Normocephalic and atraumatic.  Eyes: EOM are normal. Pupils are equal, round, and reactive to light.  Neck: No JVD present. Carotid bruit is not present.  Cardiovascular: Normal rate, regular rhythm and normal heart sounds.   No murmur heard. Pulmonary/Chest: Effort normal and breath sounds normal. He has no rales.  Musculoskeletal: He exhibits no edema.  Neurological: He is alert and oriented to person, place, and time.  Skin: Skin is warm and dry.  Psychiatric: He has a normal mood and affect.  Vitals reviewed.  Vitals:   08/27/16 1512 08/27/16 1617  BP: (!) 162/80 118/72  Pulse: 78   Resp: 16   Temp: 97.7 F (36.5 C)   TempSrc: Oral   SpO2: 97%   Weight: 240 lb (108.9 kg)       Assessment & Plan:   Timothy Martin is a 70 y.o. male Type 2 diabetes mellitus with diabetic nephropathy, without long-term current use of insulin ([place]) - Plan: glimepiride (AMARYL) 4 MG tablet, Basic metabolic panel, Hemoglobin A1C  - Check A1c, renal function with underlying nephropathy. No change in medication doses as tolerating for now.  Need for influenza vaccination - Plan: Flu Vaccine QUAD 36+ mos IM  Mixed hyperlipidemia - Plan: simvastatin (ZOCOR) 20 MG tablet  - Change to 20 mg tablet as that was the effective dose he was taking.  Essential hypertension - Plan: metoprolol succinate (TOPROL-XL) 100 MG 24 hr tablet, losartan-hydrochlorothiazide (HYZAAR) 100-25 MG tablet, Basic metabolic  panel  -Stable. BMP pending. Continue same doses of medications as tolerating and repeat level okay today.  Meds ordered this encounter  Medications  . simvastatin (ZOCOR) 20 MG tablet    Sig: Take 1 tablet (20 mg total) by mouth daily at 6 PM.    Dispense:  90 tablet    Refill:  1  . metoprolol succinate (TOPROL-XL) 100 MG 24 hr tablet    Sig: Take 1 tablet (100 mg total) by mouth daily. Take with or immediately following a meal.    Dispense:  90 tablet    Refill:  1  . losartan-hydrochlorothiazide (HYZAAR) 100-25 MG tablet    Sig: Take 1 tablet by mouth daily.    Dispense:  90 tablet    Refill:  1  . glimepiride (AMARYL) 4 MG tablet    Sig: Take 1  tablet (4 mg total) by mouth daily with breakfast.    Dispense:  90 tablet    Refill:  1   Patient Instructions    I changed the Zocor to 24m pill - take one per day.  No other changes. recheck in 3 months - fasting for that visit.  Return to the clinic or go to the nearest emergency room if any of your symptoms worsen or new symptoms occur.    IF you received an x-ray today, you will [facility]. Please contact [facility] at [phone] questions or concerns regarding your invoice.   IF you received labwork today, you will receive an invoice from [employer] Please contact [facility] at [phone] questions or concerns regarding your invoice.   Our billing staff will not be able to assist you with questions regarding bills from these companies.  You will be contacted with the lab results as soon as they are available. The fastest way to get your results is to activate your My Chart account. Instructions are located on the last page of this paperwork. If you have not heard from [place] results in 2 weeks, please contact this office.        I personally performed the services described in this documentation, which was scribed in my presence.  The recorded information has been reviewed and considered, and addended by me as needed.   Signed,   JMerri Ray MD [facility].  08/28/16 5:16 PM

## 2016-08-27 NOTE — Patient Instructions (Addendum)
  I changed the Zocor to 20mg  pill - take one per day.  No other changes. recheck in 3 months - fasting for that visit.  Return to the clinic or go to the nearest emergency room if any of your symptoms worsen or new symptoms occur.    IF you received an x-ray today, you will receive an invoice from [facility]. Please contact [facility] at [phone] with questions or concerns regarding your invoice.   IF you received labwork today, you will receive an invoice from [facility]. Please contact [facility] at [phone] with questions or concerns regarding your invoice.   Our billing staff will not be able to assist you with questions regarding bills from these companies.  You will be contacted with the lab results as soon as they are available. The fastest way to get your results is to activate your My Chart account. Instructions are located on the last page of this paperwork. If you have not heard from us regarding the results in 2 weeks, please contact this office.

## 2016-12-03 ENCOUNTER — Ambulatory Visit (INDEPENDENT_AMBULATORY_CARE_PROVIDER_SITE_OTHER): Payer: Medicare HMO | Admitting: Family Medicine

## 2016-12-03 ENCOUNTER — Encounter: Payer: Self-pay | Admitting: Family Medicine

## 2016-12-03 VITALS — BP 138/76 | HR 77 | Temp 97.7°F | Resp 16 | Ht 67.5 in | Wt 239.0 lb

## 2016-12-03 DIAGNOSIS — I10 Essential (primary) hypertension: Secondary | ICD-10-CM | POA: Diagnosis not present

## 2016-12-03 DIAGNOSIS — E7800 Pure hypercholesterolemia, unspecified: Secondary | ICD-10-CM

## 2016-12-03 DIAGNOSIS — E1122 Type 2 diabetes mellitus with diabetic chronic kidney disease: Secondary | ICD-10-CM

## 2016-12-03 NOTE — Progress Notes (Signed)
By signing my name below, I, [name], attest that this documentation has been prepared under the direction and in the presence of [name], MD.  Electronically Signed: [name], Medical Scribe. 12/03/16. 2:35 PM.  Subjective:    Patient ID: Timothy Martin, male    DOB: October 28, 1946, 71 y.o.   MRN: [id number]  HPI Chief Complaint  Patient presents with  . Follow-up    3 mon check    HPI Comments: Timothy Martin is a 71 y.o. male who presents to the [facility] for DM follow-up. Recommended diet changes and inc exercise. Pt is fasting.  DM: He takes amaryl 4 mg QD. Pt is compliant with amaryl and he hasn't made any diet changes since his last visit. Pt checks his blood sugar at home and states it ranges around 110-140. Pt walks 20 miles/ walks 5x a week. Lab Results  Component Value Date   HGBA1C 7.3 (H) 08/27/2016   Lab Results  Component Value Date   MICROALBUR 1.2 05/28/2016   Wt Readings from Last 3 Encounters:  12/03/16 239 lb (108.4 kg)  08/27/16 240 lb (108.9 kg)  05/28/16 238 lb 6.4 oz (108.1 kg)   HTN: He takes hyzaar and toprol. BP in Oct 118/72 BP at home runs 120-140/70-80. Denies chest pain, SOB, abdominal pain , SOB, or other negative sxs. BP Readings from Last 3 Encounters:  12/03/16 138/76  08/27/16 118/72  05/28/16 120/72   Chronic Kidney Disease: Range of 1.5 - 1.73 prior. Pt drinks water through the day and he avoids NSAIDs. Lab Results  Component Value Date   CREATININE 1.77 (H) 08/27/2016   HLD: He was continued on simvastatin 20 mg QD. Declines myalgias or arthralgias from his medication. Lab Results  Component Value Date   CHOL 154 05/28/2016   HDL 30 (L) 05/28/2016   LDLCALC 56 05/28/2016   TRIG 342 (H) 05/28/2016   CHOLHDL 5.1 (H) 05/28/2016   Lab Results  Component Value Date   ALT 21 05/28/2016   AST 25 05/28/2016   ALKPHOS 43 05/28/2016   BILITOT 0.8 05/28/2016    Patient Active Problem  List   Diagnosis Date Noted  . Obesity (BMI 30-39.9) 08/03/2013  . Hypertension 03/29/2013  . Type 2 diabetes mellitus ([place]) 03/29/2013  . Elevated cholesterol 03/29/2013  . 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 Diabetes mellitus   . Heart murmur   . Hyperlipidemia   . Hypertension   . Kidney stone    Past Surgical History:  Procedure Laterality Date  . CYSTOSCOPY WITH RETROGRADE PYELOGRAM, URETEROSCOPY AND STENT PLACEMENT Right 06/18/2015   Procedure: CYSTOSCOPY WITH RIGHT RETROGRADE PYELOGRAM, RIGHT URETEROSCOPY ;  Surgeon: [name], MD;  Location: [facility];  Service: Urology;  Laterality: Right;  . TONSILLECTOMY     Allergies  Allergen Reactions  . Codeine Other (See Comments)    CHILDHOOD  Allergic to all forms of Codeine   Jittery cannot function  . Augmentin [Amoxicillin-Pot Clavulanate] Rash   Prior to Admission medications   Medication Sig Start Date End Date Taking? Authorizing Provider  Alcohol Swabs PADS USE TO CHECK BLOOD SUGAR DAILY. DX CODE: 250.00 02/26/16   [name], MD  Blood Glucose Monitoring Suppl (BLOOD GLUCOSE METER) kit Use to test blood sugar daily. Dx code: 250.00 05/18/13   [name], PA-C  fish oil-omega-3 fatty acids 1000 MG capsule Take 1 g by mouth 2 (two) times daily  after a meal.    Historical Provider, MD  glimepiride (AMARYL) 4 MG tablet Take 1 tablet (4 mg total) by mouth daily with breakfast. 08/27/16   [name], MD  Lancets MISC Use to test blood sugar daily. Dx code:250.00 02/26/16   [name], MD  losartan-hydrochlorothiazide (HYZAAR) 100-25 MG tablet Take 1 tablet by mouth daily. 08/27/16   [name], MD  metoprolol succinate (TOPROL-XL) 100 MG 24 hr tablet Take 1 tablet (100 mg total) by mouth daily. Take with or immediately following a meal. 08/27/16   [name], MD  simvastatin (ZOCOR) 20 MG tablet Take 1 tablet (20 mg total) by mouth daily at 6 PM. 08/27/16    [name], MD  vitamin B-12 (CYANOCOBALAMIN) 100 MCG tablet Take 100 mcg by mouth every morning.     Historical Provider, MD   Social History   Social History  . Marital status: Married    Spouse name: N/A  . Number of children: N/A  . Years of education: N/A   Occupational History  . Not on file.   Social History Main Topics  . Smoking status: Never Smoker  . Smokeless tobacco: Never Used  . Alcohol use No  . Drug use: No  . Sexual activity: Not on file   Other Topics Concern  . Not on file   Social History Narrative  . No narrative on file   Review of Systems  Constitutional: Negative for fatigue and unexpected weight change.  Eyes: Negative for visual disturbance.  Respiratory: Negative for cough, chest tightness and shortness of breath.   Cardiovascular: Negative for chest pain, palpitations and leg swelling.  Gastrointestinal: Negative for abdominal pain and blood in stool.  Musculoskeletal: Negative for arthralgias and myalgias.  Neurological: Negative for dizziness, light-headedness and headaches.   Objective:  Physical Exam  Constitutional: He is oriented to person, place, and time. He appears well-developed and well-nourished. No distress.  HENT:  Head: Normocephalic and atraumatic.  Eyes: Conjunctivae and EOM are normal. Pupils are equal, round, and reactive to light.  Neck: Neck supple. No JVD present. Carotid bruit is not present.  Cardiovascular: Normal rate, regular rhythm and normal heart sounds.  Exam reveals no gallop and no friction rub.   No murmur heard. Pulmonary/Chest: Effort normal and breath sounds normal. No respiratory distress. He has no wheezes. He has no rales.  Musculoskeletal: He exhibits no edema (lower extremity).  Neurological: He is alert and oriented to person, place, and time.  Skin: Skin is warm and dry.  Psychiatric: He has a normal mood and affect. His behavior is normal.  Nursing note and vitals reviewed.  BP 138/76    Pulse 77   Temp 97.7 F (36.5 C) (Oral)   Resp 16   Ht 5' 7.5" (1.715 m)   Wt 239 lb (108.4 kg)   SpO2 99%   BMI 36.88 kg/m  Assessment & Plan:  Timothy Martin is a 72 y.o. male Type 2 diabetes mellitus with chronic kidney disease, without long-term current use of insulin, unspecified CKD stage ([place]) - Plan: Hemoglobin A1C, Care order/instruction:  - Slight decrease control previously. Recheck A1c to determine if med changes needed. Reinforced need for diet adherence as well as exercise. Repeat CMP to evaluate chronic kidney disease that likely is due to combination of diabetes and hypertension.  Essential hypertension - Plan: Comprehensive metabolic panel  - Borderline, but controlled. No change in meds for now. Labs pending.   Elevated  cholesterol - Plan: Comprehensive metabolic panel, Lipid panel  - Check labs, continue same dose of statin for now, but discussed with AHA/ACC guidelines may benefit from Lipitor or Crestor with diabetes depending on 10 year risk.  No orders of the defined types were placed in this encounter.  Patient Instructions   No change in medications for now. Depending on your cholesterol level, may recommend Lipitor or Crestor for improved control (especially with diabetes).  Follow-up in 3 months, sooner if needed. If you are running low on medications before next visit, call in and we can refill those prior to that visit if needed.    IF you received an x-ray today, you will receive an invoice from [facility]. Please contact [facility] at [phone] with questions or concerns regarding your invoice.   IF you received labwork today, you will receive an invoice from [facility]. Please contact [facility] at [phone] with questions or concerns regarding your invoice.   Our billing staff will not be able to assist you with questions regarding bills from these companies.  You will be contacted with the lab results as soon as they are  available. The fastest way to get your results is to activate your My Chart account. Instructions are located on the last page of this paperwork. If you have not heard from [name] regarding the results in 2 weeks, please contact this office.       I personally performed the services described in this documentation, which was scribed in my presence. The recorded information has been reviewed and considered for accuracy and completeness, addended by me as needed, and agree with information above.  Signed,   [name], MD [facility] at [place].  12/04/16 10:17 PM

## 2016-12-03 NOTE — Patient Instructions (Addendum)
No change in medications for now. Depending on your cholesterol level, may recommend Lipitor or Crestor for improved control (especially with diabetes).  Follow-up in 3 months, sooner if needed. If you are running low on medications before next visit, call in and we can refill those prior to that visit if needed.    IF you received an x-ray today, you will receive an invoice from [facility]. Please contact [facility] at [phone] with questions or concerns regarding your invoice.   IF you received labwork today, you will receive an invoice from [facility]. Please contact [facility] at [phone] with questions or concerns regarding your invoice.   Our billing staff will not be able to assist you with questions regarding bills from these companies.  You will be contacted with the lab results as soon as they are available. The fastest way to get your results is to activate your My Chart account. Instructions are located on the last page of this paperwork. If you have not heard from us regarding the results in 2 weeks, please contact this office.

## 2016-12-04 LAB — LIPID PANEL
CHOL/HDL RATIO: 4.2 ratio (ref 0.0–5.0)
CHOLESTEROL TOTAL: 137 mg/dL (ref 100–199)
HDL: 33 mg/dL — AB (ref >39)
LDL CALC: 55 mg/dL (ref 0–99)
TRIGLYCERIDES: 246 mg/dL — AB (ref 0–149)
VLDL CHOLESTEROL CAL: 49 mg/dL — AB (ref 5–40)

## 2016-12-04 LAB — HEMOGLOBIN A1C
Est. average glucose Bld gHb Est-mCnc: 160 mg/dL
Hgb A1c MFr Bld: 7.2 % — ABNORMAL HIGH (ref 4.8–5.6)

## 2016-12-04 LAB — COMPREHENSIVE METABOLIC PANEL
ALK PHOS: 46 IU/L (ref 39–117)
ALT: 25 IU/L (ref 0–44)
AST: 36 IU/L (ref 0–40)
Albumin/Globulin Ratio: 1.5 (ref 1.2–2.2)
Albumin: 4.8 g/dL (ref 3.6–4.8)
BUN/Creatinine Ratio: 21 (ref 10–24)
BUN: 31 mg/dL — AB (ref 8–27)
Bilirubin Total: 0.8 mg/dL (ref 0.0–1.2)
CALCIUM: 9.8 mg/dL (ref 8.6–10.2)
CO2: 25 mmol/L (ref 18–29)
CREATININE: 1.5 mg/dL — AB (ref 0.76–1.27)
Chloride: 99 mmol/L (ref 96–106)
GFR calc Af Amer: 54 mL/min/{1.73_m2} — ABNORMAL LOW (ref >59)
GFR, EST NON AFRICAN AMERICAN: 47 mL/min/{1.73_m2} — AB (ref >59)
GLOBULIN, TOTAL: 3.1 g/dL (ref 1.5–4.5)
GLUCOSE: 75 mg/dL (ref 65–99)
Potassium: 4.4 mmol/L (ref 3.5–5.2)
Sodium: 143 mmol/L (ref 134–144)
Total Protein: 7.9 g/dL (ref 6.0–8.5)

## 2017-03-04 ENCOUNTER — Ambulatory Visit (INDEPENDENT_AMBULATORY_CARE_PROVIDER_SITE_OTHER): Payer: Medicare HMO | Admitting: Family Medicine

## 2017-03-04 ENCOUNTER — Encounter: Payer: Self-pay | Admitting: Family Medicine

## 2017-03-04 VITALS — BP 144/70 | HR 78 | Temp 97.3°F | Resp 16 | Ht 67.25 in | Wt 243.2 lb

## 2017-03-04 DIAGNOSIS — I10 Essential (primary) hypertension: Secondary | ICD-10-CM | POA: Diagnosis not present

## 2017-03-04 DIAGNOSIS — N189 Chronic kidney disease, unspecified: Secondary | ICD-10-CM

## 2017-03-04 DIAGNOSIS — G6289 Other specified polyneuropathies: Secondary | ICD-10-CM

## 2017-03-04 DIAGNOSIS — E1121 Type 2 diabetes mellitus with diabetic nephropathy: Secondary | ICD-10-CM

## 2017-03-04 MED ORDER — GLUCOSE BLOOD VI STRP: ORAL_STRIP | 4 refills | Status: DC

## 2017-03-04 MED ORDER — AMLODIPINE BESYLATE 2.5 MG PO TABS: 2.5000 mg | ORAL_TABLET | Freq: Every day | ORAL | 1 refills | Status: DC

## 2017-03-04 MED ORDER — GLIMEPIRIDE 4 MG PO TABS: 4.0000 mg | ORAL_TABLET | Freq: Every day | ORAL | 1 refills | Status: DC

## 2017-03-04 MED ORDER — ATORVASTATIN CALCIUM 10 MG PO TABS: 10.0000 mg | ORAL_TABLET | Freq: Every day | ORAL | 1 refills | Status: DC

## 2017-03-04 MED ORDER — METOPROLOL SUCCINATE ER 100 MG PO TB24: 100.0000 mg | ORAL_TABLET | Freq: Every day | ORAL | 1 refills | Status: DC

## 2017-03-04 MED ORDER — LOSARTAN POTASSIUM-HCTZ 100-25 MG PO TABS: 1.0000 | ORAL_TABLET | Freq: Every day | ORAL | 1 refills | Status: DC

## 2017-03-04 MED ORDER — GABAPENTIN 300 MG PO CAPS: 300.0000 mg | ORAL_CAPSULE | Freq: Every day | ORAL | 3 refills | Status: DC

## 2017-03-04 MED ORDER — LANCETS MISC: 3 refills | Status: AC

## 2017-03-04 NOTE — Progress Notes (Addendum)
By signing my name below, I, Timothy Martin, attest that this documentation has been prepared under the direction and in the presence of [name], MD.  Electronically Signed: [name], Medical Scribe. 03/04/17. 2:35 PM.  Subjective:    Patient ID: Timothy Martin, male    DOB: [birth year], 71 y.o.   MRN: [id number]  HPI Chief Complaint  Patient presents with  . Follow-up    Diabetes  . Medication Refill    ALL RFs and supplies    HPI Comments: Timothy Martin is a 71 y.o. male who presents to Primary Care at [facility] for follow-up.  T2DM with renal compliacations: He takes amaryl 4 mg QD. It was not quite controlled at last visit, recommended to continue diet, exercise and the same medication regimen. Pt is complaint with amaryl and checks his blood sugar QD. Reports his feet has felt tight, mainly after exercise, for the past month. Pt has not tried medication/treatment. He walks 4 miles 5x a week. Denies tingling sensation in his feet, burning, numbness, edema, rash, and lumps/bumps. Lab Results  Component Value Date   HGBA1C 7.2 (H) 12/03/2016   Lab Results  Component Value Date   MICROALBUR 1.2 05/28/2016   Wt Readings from Last 3 Encounters:  03/04/17 243 lb 3.2 oz (110.3 kg)  12/03/16 239 lb (108.4 kg)  08/27/16 240 lb (108.9 kg)   Chronic Kidney Diseae: Advised to avoid NSAIDs. Improved creatinine from range of 1.6 to 1.77. Pt does not use NSAIDs. Lab Results  Component Value Date   CREATININE 1.50 (H) 12/03/2016   HTN: Takes hyzaar 100/25 mg QD, and toprol 100 mg QD. Pt's bp runs about 120-155/78. Pt is compliant with his medication. Denies chest pain, light-headedness, dizziness, or other acute side effects/sxs. BP Readings from Last 3 Encounters:  03/04/17 (!) 178/74  12/03/16 138/76  08/27/16 118/72   HLD: Takes simvastatin 20 mg QD. Pt is complaint with his medication. Denies experiencing myalgias or other acute side effects or sxs. Lab Results    Component Value Date   CHOL 137 12/03/2016   HDL 33 (L) 12/03/2016   LDLCALC 55 12/03/2016   TRIG 246 (H) 12/03/2016   CHOLHDL 4.2 12/03/2016   Lab Results  Component Value Date   ALT 25 12/03/2016   AST 36 12/03/2016   ALKPHOS 46 12/03/2016   BILITOT 0.8 12/03/2016   Patient Active Problem List   Diagnosis Date Noted  . Obesity (BMI 30-39.9) 08/03/2013  . Hypertension 03/29/2013  . DM (diabetes mellitus), type 2 with renal complications ([place]) 46/96/2952  . Elevated cholesterol 03/29/2013  . 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 Diabetes mellitus   . Heart murmur   . Hyperlipidemia   . Hypertension   . Kidney stone    Past Surgical History:  Procedure Laterality Date  . CYSTOSCOPY WITH RETROGRADE PYELOGRAM, URETEROSCOPY AND STENT PLACEMENT Right 06/18/2015   Procedure: CYSTOSCOPY WITH RIGHT RETROGRADE PYELOGRAM, RIGHT URETEROSCOPY ;  Surgeon: [name], MD;  Location: [facility];  Service: Urology;  Laterality: Right;  . TONSILLECTOMY     Allergies  Allergen Reactions  . Codeine Other (See Comments)    CHILDHOOD  Allergic to all forms of Codeine   Jittery cannot function  . Augmentin [Amoxicillin-Pot Clavulanate] Rash   Prior to Admission medications   Medication Sig Start Date End Date Taking? Authorizing Provider  Alcohol Swabs PADS USE TO CHECK BLOOD SUGAR DAILY. DX CODE: 250.00 02/26/16  Yes Dellis Filbert  Valora Piccolo, MD  Blood Glucose Monitoring Suppl (BLOOD GLUCOSE METER) kit Use to test blood sugar daily. Dx code: 250.00 05/18/13  Yes Heather Elnora Morrison, PA-C  fish oil-omega-3 fatty acids 1000 MG capsule Take 1 g by mouth 2 (two) times daily after a meal.   Yes Historical Provider, MD  glimepiride (AMARYL) 4 MG tablet Take 1 tablet (4 mg total) by mouth daily with breakfast. 08/27/16  Yes [name], MD  losartan-hydrochlorothiazide (HYZAAR) 100-25 MG tablet Take 1 tablet by mouth daily. 08/27/16  Yes [name], MD  metoprolol  succinate (TOPROL-XL) 100 MG 24 hr tablet Take 1 tablet (100 mg total) by mouth daily. Take with or immediately following a meal. 08/27/16  Yes [name], MD  simvastatin (ZOCOR) 20 MG tablet Take 1 tablet (20 mg total) by mouth daily at 6 PM. 08/27/16  Yes [name], MD  vitamin B-12 (CYANOCOBALAMIN) 100 MCG tablet Take 100 mcg by mouth every morning.    Yes Historical Provider, MD  Lancets MISC Use to test blood sugar daily. Dx code:250.00 02/26/16   [name], MD   Social History   Social History  . Marital status: Married    Spouse name: N/A  . Number of children: N/A  . Years of education: N/A   Occupational History  . Not on file.   Social History Main Topics  . Smoking status: Never Smoker  . Smokeless tobacco: Never Used  . Alcohol use No  . Drug use: No  . Sexual activity: Not on file   Other Topics Concern  . Not on file   Social History Narrative  . No narrative on file   Review of Systems  Constitutional: Negative for fatigue and unexpected weight change.  Eyes: Negative for visual disturbance.  Respiratory: Negative for cough, chest tightness and shortness of breath.   Cardiovascular: Negative for chest pain, palpitations and leg swelling.  Gastrointestinal: Negative for abdominal pain and blood in stool.  Musculoskeletal: Negative for myalgias.  Skin: Negative for color change and rash.  Neurological: Negative for dizziness, light-headedness, numbness and headaches.   Objective:  Physical Exam  Constitutional: He is oriented to person, place, and time. He appears well-developed and well-nourished.  HENT:  Head: Normocephalic and atraumatic.  Eyes: EOM are normal. Pupils are equal, round, and reactive to light.  Neck: No JVD present. Carotid bruit is not present.  Cardiovascular: Normal rate, regular rhythm and normal heart sounds.   No murmur heard. Pulses:      Dorsalis pedis pulses are 2+ on the right side, and 2+ on the left side.    NVI distally to toes  Pulmonary/Chest: Effort normal and breath sounds normal. He has no rales.  Musculoskeletal: He exhibits no edema (lower extremity).       Right foot: There is no swelling (soft tissue).       Left foot: There is no swelling (soft tissue).  Neurological: He is alert and oriented to person, place, and time.  Skin: Skin is warm and dry.  Psychiatric: He has a normal mood and affect.  Vitals reviewed.   Vitals:   03/04/17 1415 03/04/17 1449  BP: (!) 178/74 (!) 144/70  Pulse: 78   Resp: 16   Temp: 97.3 F (36.3 C)   TempSrc: Oral   SpO2: 97%   Weight: 243 lb 3.2 oz (110.3 kg)   Height: 5' 7.25" (1.708 m)   Body mass index is 37.81 kg/m.  Assessment &  Plan:   Timothy Martin is a 71 y.o. male Type 2 diabetes mellitus with diabetic nephropathy, without long-term current use of insulin ([place]) - Plan: HM Diabetes Foot Exam, glimepiride (AMARYL) 4 MG tablet, Lancets MISC, glucose blood test strip, Care order/instruction:, Hemoglobin A1c  - Check A1c, if still elevated, anticipate adding medication versus change in Amaryl dose.  -Change to Lipitor with history of diabetes and hyperlipidemia. If tolerated, can increase dose.  Essential hypertension - Plan: metoprolol succinate (TOPROL-XL) 100 MG 24 hr tablet, losartan-hydrochlorothiazide (HYZAAR) 100-25 MG tablet  -Borderline on current regimen, but with diabetes, will improve control. Add amlodipine 2.5 mg daily area  Other polyneuropathy  -Tight sensation of heat may be diabetic neuropathy. Try gabapentin 1 at bedtime, adjust dose as needed, follow up in 3 months.  Chronic kidney disease, unspecified CKD stage - Plan: Basic metabolic panel  -Avoid nephrotoxins, repeat BMP. Work on diabetes and hypertension control as above.  Meds ordered this encounter  Medications  . atorvastatin (LIPITOR) 10 MG tablet    Sig: Take 1 tablet (10 mg total) by mouth daily.    Dispense:  90 tablet    Refill:  1  . metoprolol  succinate (TOPROL-XL) 100 MG 24 hr tablet    Sig: Take 1 tablet (100 mg total) by mouth daily. Take with or immediately following a meal.    Dispense:  90 tablet    Refill:  1  . losartan-hydrochlorothiazide (HYZAAR) 100-25 MG tablet    Sig: Take 1 tablet by mouth daily.    Dispense:  90 tablet    Refill:  1  . glimepiride (AMARYL) 4 MG tablet    Sig: Take 1 tablet (4 mg total) by mouth daily with breakfast.    Dispense:  90 tablet    Refill:  1  . Lancets MISC    Sig: Use to test blood sugar daily. Dx code:250.00    Dispense:  100 each    Refill:  3    diabetes type 2, uncontrolled with nephropathy. Check once per day.  Marland Kitchen glucose blood test strip    Sig: Use as instructed one per day    Dispense:  100 each    Refill:  4  . gabapentin (NEURONTIN) 300 MG capsule    Sig: Take 1 capsule (300 mg total) by mouth at bedtime.    Dispense:  30 capsule    Refill:  3  . amLODipine (NORVASC) 2.5 MG tablet    Sig: Take 1 tablet (2.5 mg total) by mouth daily.    Dispense:  90 tablet    Refill:  1   Patient Instructions   Start atorvastatin 1 pill per day. Recheck cholesterol levels at next visit in 3 months.  I would like to get your blood pressure under a little better control. Start amlodipine 2.5 mg once per day. Keep a record of your readings and bring to your next visit.  The sensation in your feet may be the start of diabetic neuropathy or nerve irritation from diabetes. Try gabapentin 1 pill at bedtime. If you have difficulty tolerating that medication, there are other options.    IF you received an x-ray today, you will receive an invoice from [facility]. Please contact [facility] at [phone] with questions or concerns regarding your invoice.   IF you received labwork today, you will receive an invoice from [facility]. Please contact [facility] at [phone] with questions or concerns regarding your invoice.   Our billing staff will not  be able to assist  you with questions regarding bills from these companies.  You will be contacted with the lab results as soon as they are available. The fastest way to get your results is to activate your My Chart account. Instructions are located on the last page of this paperwork. If you have not heard from [name] regarding the results in 2 weeks, please contact this office.      I personally performed the services described in this documentation, which was scribed in my presence. The recorded information has been reviewed and considered for accuracy and completeness, addended by me as needed, and agree with information above.  Signed,   [name], MD [facility] at [place].  03/06/17 1:29 PM

## 2017-03-04 NOTE — Progress Notes (Signed)
Faxed to [facility]

## 2017-03-04 NOTE — Patient Instructions (Addendum)
Start atorvastatin 1 pill per day. Recheck cholesterol levels at next visit in 3 months.  I would like to get your blood pressure under a little better control. Start amlodipine 2.5 mg once per day. Keep a record of your readings and bring to your next visit.  The sensation in your feet may be the start of diabetic neuropathy or nerve irritation from diabetes. Try gabapentin 1 pill at bedtime. If you have difficulty tolerating that medication, there are other options.    IF you received an x-ray today, you will receive an invoice from [facility]. Please contact [facility] at [phone] with questions or concerns regarding your invoice.   IF you received labwork today, you will receive an invoice from [facility]. Please contact [facility] at [phone] with questions or concerns regarding your invoice.   Our billing staff will not be able to assist you with questions regarding bills from these companies.  You will be contacted with the lab results as soon as they are available. The fastest way to get your results is to activate your My Chart account. Instructions are located on the last page of this paperwork. If you have not heard from us regarding the results in 2 weeks, please contact this office.

## 2017-03-05 LAB — BASIC METABOLIC PANEL
BUN/Creatinine Ratio: 27 — ABNORMAL HIGH (ref 10–24)
BUN: 42 mg/dL — ABNORMAL HIGH (ref 8–27)
CO2: 22 mmol/L (ref 18–29)
CREATININE: 1.56 mg/dL — AB (ref 0.76–1.27)
Calcium: 9.4 mg/dL (ref 8.6–10.2)
Chloride: 102 mmol/L (ref 96–106)
GFR calc Af Amer: 51 mL/min/{1.73_m2} — ABNORMAL LOW (ref >59)
GFR, EST NON AFRICAN AMERICAN: 44 mL/min/{1.73_m2} — AB (ref >59)
Glucose: 200 mg/dL — ABNORMAL HIGH (ref 65–99)
Potassium: 4.1 mmol/L (ref 3.5–5.2)
SODIUM: 145 mmol/L — AB (ref 134–144)

## 2017-03-05 LAB — HEMOGLOBIN A1C
Est. average glucose Bld gHb Est-mCnc: 177 mg/dL
Hgb A1c MFr Bld: 7.8 % — ABNORMAL HIGH (ref 4.8–5.6)

## 2017-05-31 ENCOUNTER — Telehealth: Payer: Self-pay | Admitting: Family Medicine

## 2017-05-31 DIAGNOSIS — E1121 Type 2 diabetes mellitus with diabetic nephropathy: Secondary | ICD-10-CM

## 2017-05-31 NOTE — Telephone Encounter (Signed)
Patient needs accu-check machine and strips to go with it sent in to the [facility] pharmacy because he states his is not working anymore.

## 2017-06-03 ENCOUNTER — Other Ambulatory Visit: Payer: Self-pay

## 2017-06-03 MED ORDER — BLOOD GLUCOSE MONITOR KIT: PACK | 0 refills | Status: DC

## 2017-06-03 MED ORDER — GLUCOSE BLOOD VI STRP: ORAL_STRIP | 4 refills | Status: AC

## 2017-06-03 NOTE — Telephone Encounter (Signed)
Please advise 

## 2017-06-03 NOTE — Telephone Encounter (Signed)
rx sent for lancets, but please fax Rx for meter as it printed. Placed in front desk box for [employer].

## 2017-06-04 ENCOUNTER — Telehealth: Payer: Self-pay

## 2017-06-04 NOTE — Telephone Encounter (Signed)
Advised pt that rx for glucose meter kit will be faxed to [place]

## 2017-06-07 ENCOUNTER — Other Ambulatory Visit: Payer: Self-pay

## 2017-06-07 ENCOUNTER — Other Ambulatory Visit: Payer: Self-pay | Admitting: Family Medicine

## 2017-06-07 DIAGNOSIS — E1121 Type 2 diabetes mellitus with diabetic nephropathy: Secondary | ICD-10-CM

## 2017-06-07 MED ORDER — BLOOD GLUCOSE MONITOR KIT: PACK | 0 refills | Status: DC

## 2017-06-07 NOTE — Telephone Encounter (Signed)
Faxed new rx for diabetic supplies.

## 2017-06-09 ENCOUNTER — Ambulatory Visit (INDEPENDENT_AMBULATORY_CARE_PROVIDER_SITE_OTHER): Payer: Medicare HMO | Admitting: Emergency Medicine

## 2017-06-09 DIAGNOSIS — E1122 Type 2 diabetes mellitus with diabetic chronic kidney disease: Secondary | ICD-10-CM

## 2017-06-09 DIAGNOSIS — Z0189 Encounter for other specified special examinations: Secondary | ICD-10-CM

## 2017-06-10 ENCOUNTER — Encounter: Payer: Self-pay | Admitting: Family Medicine

## 2017-06-10 ENCOUNTER — Ambulatory Visit (INDEPENDENT_AMBULATORY_CARE_PROVIDER_SITE_OTHER): Payer: Medicare HMO | Admitting: Family Medicine

## 2017-06-10 VITALS — BP 118/62 | HR 70 | Temp 98.0°F | Resp 16 | Ht 67.5 in | Wt 244.0 lb

## 2017-06-10 DIAGNOSIS — N189 Chronic kidney disease, unspecified: Secondary | ICD-10-CM | POA: Diagnosis not present

## 2017-06-10 DIAGNOSIS — G629 Polyneuropathy, unspecified: Secondary | ICD-10-CM

## 2017-06-10 DIAGNOSIS — E1122 Type 2 diabetes mellitus with diabetic chronic kidney disease: Secondary | ICD-10-CM

## 2017-06-10 DIAGNOSIS — E785 Hyperlipidemia, unspecified: Secondary | ICD-10-CM | POA: Diagnosis not present

## 2017-06-10 LAB — LIPID PANEL
CHOL/HDL RATIO: 4.8 ratio (ref 0.0–5.0)
Cholesterol, Total: 140 mg/dL (ref 100–199)
HDL: 29 mg/dL — ABNORMAL LOW (ref >39)
LDL CALC: 57 mg/dL (ref 0–99)
Triglycerides: 269 mg/dL — ABNORMAL HIGH (ref 0–149)
VLDL CHOLESTEROL CAL: 54 mg/dL — AB (ref 5–40)

## 2017-06-10 LAB — HEMOGLOBIN A1C
Est. average glucose Bld gHb Est-mCnc: 180 mg/dL
Hgb A1c MFr Bld: 7.9 % — ABNORMAL HIGH (ref 4.8–5.6)

## 2017-06-10 MED ORDER — LIRAGLUTIDE 18 MG/3ML ~~LOC~~ SOPN: 1.2000 mg | PEN_INJECTOR | SUBCUTANEOUS | 1 refills | Status: DC

## 2017-06-10 NOTE — Progress Notes (Signed)
Here for lab work only

## 2017-06-10 NOTE — Patient Instructions (Addendum)
Decrease glimepiride to 76m each day, and start Victoza once per week (initially 0.163m then increase to 0.75m30mn the second week).  Watch for low blood sugar symptoms. No other med changes for now.    Hypoglycemia Hypoglycemia occurs when the level of sugar (glucose) in the blood is too low. Glucose is a type of sugar that provides the body's main source of energy. Certain hormones (insulin and glucagon) control the level of glucose in the blood. Insulin lowers blood glucose, and glucagon increases blood glucose. Hypoglycemia can result from having too much insulin in the bloodstream, or from not eating enough food that contains glucose. Hypoglycemia can happen in people who do or do not have diabetes. It can develop quickly, and it can be a medical emergency. What are the causes? Hypoglycemia occurs most often in people who have diabetes. If you have diabetes, hypoglycemia may be caused by:  Diabetes medicine.  Not eating enough, or not eating often enough.  Increased physical activity.  Drinking alcohol, especially when you have not eaten recently.  If you do not have diabetes, hypoglycemia may be caused by:  A tumor in the pancreas. The pancreas is the organ that makes insulin.  Not eating enough, or not eating for long periods at a time (fasting).  Severe infection or illness that affects the liver, heart, or kidneys.  Certain medicines.  You may also have reactive hypoglycemia. This condition causes hypoglycemia within 4 hours of eating a meal. This may occur after having stomach surgery. Sometimes, the cause of reactive hypoglycemia is not known. What increases the risk? Hypoglycemia is more likely to develop in:  People who have diabetes and take medicines to lower blood glucose.  People who abuse alcohol.  People who have a severe illness.  What are the signs or symptoms? Hypoglycemia may not cause any symptoms. If you have symptoms, they may  include:  Hunger.  Anxiety.  Sweating and feeling clammy.  Confusion.  Dizziness or feeling light-headed.  Sleepiness.  Nausea.  Increased heart rate.  Headache.  Blurry vision.  Seizure.  Nightmares.  Tingling or numbness around the mouth, lips, or tongue.  A change in speech.  Decreased ability to concentrate.  A change in coordination.  Restless sleep.  Tremors or shakes.  Fainting.  Irritability.  How is this diagnosed? Hypoglycemia is diagnosed with a blood test to measure your blood glucose level. This blood test is done while you are having symptoms. Your health care provider may also do a physical exam and review your medical history. If you do not have diabetes, other tests may be done to find the cause of your hypoglycemia. How is this treated? This condition can often be treated by immediately eating or drinking something that contains glucose, such as:  3-4 sugar tablets (glucose pills).  Glucose gel, 15-gram tube.  Fruit juice, 4 oz (120 mL).  Regular soda (not diet soda), 4 oz (120 mL).  Low-fat milk, 4 oz (120 mL).  Several pieces of hard candy.  Sugar or honey, 1 Tbsp.  Treating Hypoglycemia If You Have Diabetes  If you are alert and able to swallow safely, follow the 15:15 rule:  Take 15 grams of a rapid-acting carbohydrate. Rapid-acting options include: ? 1 tube of glucose gel. ? 3 glucose pills. ? 6-8 pieces of hard candy. ? 4 oz (120 mL) of fruit juice. ? 4 oz (120 ml) of regular (not diet) soda.  Check your blood glucose 15 minutes after you take the  carbohydrate.  If the repeat blood glucose level is still at or below 70 mg/dL (3.9 mmol/L), take 15 grams of a carbohydrate again.  If your blood glucose level does not increase above 70 mg/dL (3.9 mmol/L) after 3 tries, seek emergency medical care.  After your blood glucose level returns to normal, eat a meal or a snack within 1 hour.  Treating Severe  Hypoglycemia Severe hypoglycemia is when your blood glucose level is at or below 54 mg/dL (3 mmol/L). Severe hypoglycemia is an emergency. Do not wait to see if the symptoms will go away. Get medical help right away. Call your local emergency services (911 in the [place] Do not drive yourself to the hospital. If you have severe hypoglycemia and you cannot eat or drink, you may need an injection of glucagon. A family member or close friend should learn how to check your blood glucose and how to give you a glucagon injection. Ask your health care provider if you need to have an emergency glucagon injection kit available. Severe hypoglycemia may need to be treated in a hospital. The treatment may include getting glucose through an IV tube. You may also need treatment for the cause of your hypoglycemia. Follow these instructions at home: General instructions  Avoid any diets that cause you to not eat enough food. Talk with your health care provider before you start any new diet.  Take over-the-counter and prescription medicines only as told by your health care provider.  Limit alcohol intake to no more than 1 drink per day for nonpregnant women and 2 drinks per day for men. One drink equals 12 oz of beer, 5 oz of wine, or 1 oz of hard liquor.  Keep all follow-up visits as told by your health care provider. This is important. If You Have Diabetes:   Make sure you know the symptoms of hypoglycemia.  Always have a rapid-acting carbohydrate snack with you to treat low blood sugar.  Follow your diabetes management plan, as told by your health care provider. Make sure you: ? Take your medicines as directed. ? Follow your exercise plan. ? Follow your meal plan. Eat on time, and do not skip meals. ? Check your blood glucose as often as directed. Make sure to check your blood glucose before and after exercise. If you exercise longer or in a different way than usual, check your blood glucose more  often. ? Follow your sick day plan whenever you cannot eat or drink normally. Make this plan in advance with your health care provider.  Share your diabetes management plan with people in your workplace, school, and household.  Check your urine for ketones when you are ill and as told by your health care provider.  Carry a medical alert card or wear medical alert jewelry. If You Have Reactive Hypoglycemia or Low Blood Sugar From Other Causes:  Monitor your blood glucose as told by your health care provider.  Follow instructions from your health care provider about eating or drinking restrictions. Contact a health care provider if:  You have problems keeping your blood glucose in your target range.  You have frequent episodes of hypoglycemia. Get help right away if:  You continue to have hypoglycemia symptoms after eating or drinking something containing glucose.  Your blood glucose is at or below 54 mg/dL (3 mmol/L).  You have a seizure.  You faint. These symptoms may represent a serious problem that is an emergency. Do not wait to see if the symptoms will  go away. Get medical help right away. Call your local emergency services (911 in the [place] Do not drive yourself to the hospital. This information is not intended to replace advice given to you by your health care provider. Make sure you discuss any questions you have with your health care provider. Document Released: 10/19/2005 Document Revised: 04/01/2016 Document Reviewed: 11/22/2015 Elsevier Interactive Patient Education  2018 [employer].    IF you received an x-ray today, you will receive an invoice from [facility]. Please contact [facility] at [phone] with questions or concerns regarding your invoice.   IF you received labwork today, you will receive an invoice from [facility]. Please contact [facility] at [phone] with questions or concerns regarding your invoice.   Our billing staff will  not be able to assist you with questions regarding bills from these companies.  You will be contacted with the lab results as soon as they are available. The fastest way to get your results is to activate your My Chart account. Instructions are located on the last page of this paperwork. If you have not heard from [name] regarding the results in 2 weeks, please contact this office.

## 2017-06-10 NOTE — Progress Notes (Signed)
By signing my name below, I, [name], attest that this documentation has been prepared under the direction and in the presence of [name], MD.  Electronically Signed: [name], Medical Scribe. 06/10/17. 2:54 PM.  Subjective:    Patient ID: Timothy Martin, male    DOB: 1945/11/25, 71 y.o.   MRN: [id number]  HPI Chief Complaint  Patient presents with  . Follow-up    DM    HPI Comments: Timothy Martin is a 71 y.o. male with a PMHx of HTN, DM, elevated cholsesterol, and obesity who presents to Primary Care at [facility] for DM follow-up. Pt is not fasting.   DM with diabetic neuropathy: Last office was May 3rd. Creatineine from 1.6 - 1.77. A1c elevated last visit we increased the amaryl to 6 mg QD, and additionally started gabapentin 300 mg QHS for possible neuropathic sxs in his feet. He had blood work yesterday with A1c 7.9, increased from 7.8 3 months ago. GFR was approx 44. We have avoided metformin with CKD. Pt is compliant with amaryl 6 mg QD, and gabapentin. Pt's diabetic neuropathy has resolved improved since his last visit. His blood sugar has been reading 91-140. The night before his highest he ate pizza. Pt is exercising. Denies experiencing symptomatic lows. Lab Results  Component Value Date   HGBA1C 7.9 (H) 06/09/2017   Lab Results  Component Value Date   MICROALBUR 1.2 05/28/2016   Wt Readings from Last 3 Encounters:  06/10/17 244 lb (110.7 kg)  03/04/17 243 lb 3.2 oz (110.3 kg)  12/03/16 239 lb (108.4 kg)  Body mass index is 37.65 kg/m.  HTN: BP borderline elevated on proir visit at 144/70. We added norvasc 2.5 mg QD last visit. Pt has been complaint with norvasc. Pt's bp has been running 127-128/60 and he feels "sluggish". Denies experiencing negative side effects.  Lab Results  Component Value Date   CREATININE 1.56 (H) 03/04/2017   BP Readings from Last 3 Encounters:  06/10/17 118/62  03/04/17 (!) 144/70  12/03/16 138/76   HLD: He was  previously on zocor, but risk factoring including DM he was changed to  lipitor 10 mg QD at May visit.  Pt is complaint with his medication and denies experiencing negative side effects. Lab Results  Component Value Date   CHOL 140 06/09/2017   HDL 29 (L) 06/09/2017   LDLCALC 57 06/09/2017   TRIG 269 (H) 06/09/2017   CHOLHDL 4.8 06/09/2017   Lab Results  Component Value Date   ALT 25 12/03/2016   AST 36 12/03/2016   ALKPHOS 46 12/03/2016   BILITOT 0.8 12/03/2016   Patient Active Problem List   Diagnosis Date Noted  . Obesity (BMI 30-39.9) 08/03/2013  . Hypertension 03/29/2013  . DM (diabetes mellitus), type 2 with renal complications ([place]) 76/28/3151  . Elevated cholesterol 03/29/2013  . 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 Diabetes mellitus   . Heart murmur   . Hyperlipidemia   . Hypertension   . Kidney stone    Past Surgical History:  Procedure Laterality Date  . CYSTOSCOPY WITH RETROGRADE PYELOGRAM, URETEROSCOPY AND STENT PLACEMENT Right 06/18/2015   Procedure: CYSTOSCOPY WITH RIGHT RETROGRADE PYELOGRAM, RIGHT URETEROSCOPY ;  Surgeon: [name], MD;  Location: [facility];  Service: Urology;  Laterality: Right;  . TONSILLECTOMY     Allergies  Allergen Reactions  . Codeine Other (See Comments)    CHILDHOOD  Allergic to all forms of Codeine   Jittery cannot  function  . Augmentin [Amoxicillin-Pot Clavulanate] Rash   Prior to Admission medications   Medication Sig Start Date End Date Taking? Authorizing Provider  Alcohol Swabs PADS USE TO CHECK BLOOD SUGAR DAILY. DX CODE: 250.00 02/26/16  Yes [name], MD  amLODipine (NORVASC) 2.5 MG tablet Take 1 tablet (2.5 mg total) by mouth daily. 03/04/17  Yes [name], MD  atorvastatin (LIPITOR) 10 MG tablet Take 1 tablet (10 mg total) by mouth daily. 03/04/17  Yes [name], MD  blood glucose meter kit and supplies KIT Dispense based on patient and insurance preference.  Testing once per day. E11.29 06/07/17  Yes [name], MD  Blood Glucose Monitoring Suppl (BLOOD GLUCOSE METER) kit Use to test blood sugar daily. Dx code: 250.00 05/18/13  Yes [name], [name], PA-C  fish oil-omega-3 fatty acids 1000 MG capsule Take 1 g by mouth 2 (two) times daily after a meal.   Yes [provider]  gabapentin (NEURONTIN) 300 MG capsule TAKE 1 CAPSULE AT BEDTIME 06/08/17  Yes [name], MD  glimepiride (AMARYL) 4 MG tablet Take 1 tablet (4 mg total) by mouth daily with breakfast. 03/04/17  Yes [name], MD  glucose blood test strip Use as instructed one per day 06/03/17  Yes [name], MD  Lancets MISC Use to test blood sugar daily. Dx code:250.00 03/04/17  Yes [name], MD  losartan-hydrochlorothiazide (HYZAAR) 100-25 MG tablet Take 1 tablet by mouth daily. 03/04/17  Yes [name], MD  metoprolol succinate (TOPROL-XL) 100 MG 24 hr tablet Take 1 tablet (100 mg total) by mouth daily. Take with or immediately following a meal. 03/04/17  Yes [name], MD  simvastatin (ZOCOR) 20 MG tablet Take 20 mg by mouth daily.   Yes [provider]  vitamin B-12 (CYANOCOBALAMIN) 100 MCG tablet Take 100 mcg by mouth every morning.    Yes [provider]   Social History   Social History  . Marital status: Married    Spouse name: N/A  . Number of children: N/A  . Years of education: N/A   Occupational History  . Not on file.   Social History Main Topics  . Smoking status: Never Smoker  . Smokeless tobacco: Never Used  . Alcohol use No  . Drug use: No  . Sexual activity: Not on file   Other Topics Concern  . Not on file   Social History Narrative  . No narrative on file   Review of Systems  Constitutional: Positive for fatigue ("sluggish"). Negative for unexpected weight change.  Eyes: Negative for visual disturbance.  Respiratory: Negative for cough, chest tightness and shortness of breath.     Cardiovascular: Negative for chest pain, palpitations and leg swelling.  Gastrointestinal: Negative for abdominal pain and blood in stool.  Neurological: Negative for dizziness, light-headedness and headaches.   Objective:  Physical Exam  Constitutional: He appears well-developed and well-nourished. No distress.  HENT:  Head: Normocephalic and atraumatic.  Eyes: Conjunctivae are normal.  Neck: Neck supple.  Cardiovascular: Normal rate, regular rhythm and normal heart sounds.  Exam reveals no gallop and no friction rub.   No murmur heard. Pulmonary/Chest: Effort normal and breath sounds normal. No respiratory distress. He has no wheezes. He has no rales.  Abdominal: Soft. There is no tenderness. There is no CVA tenderness.  Musculoskeletal: He exhibits no edema.  Neurological: He is alert.  Skin: Skin is warm and dry.  Psychiatric:  He has a normal mood and affect. His behavior is normal.  Nursing note and vitals reviewed.   Vitals:   06/10/17 1417 06/10/17 1453  BP: (!) 146/76 118/62  Pulse: 70   Resp: 16   Temp: 98 F (36.7 C)   TempSrc: Oral   SpO2: 97%   Weight: 244 lb (110.7 kg)   Height: 5' 7.5" (1.715 m)   Body mass index is 37.65 kg/m. Assessment & Plan:    Timothy Martin is a 71 y.o. male Chronic kidney disease, unspecified CKD stage - Plan: Comprehensive metabolic panel  - Check CMP, avoid nephrotoxins. Based on prior GFR, will avoid metformin, SGLT 2 inhibitor at this point for his diabetes.  Type 2 diabetes mellitus with chronic kidney disease, without long-term current use of insulin, unspecified CKD stage ([place]) - Plan: Microalbumin/Creatinine Ratio, Urine, Comprehensive metabolic panel, liraglutide (VICTOZA) 18 MG/3ML SOPN  -Uncontrolled with increased sulfonylurea dose. Options discussed, including some limitations based on his last GFR.  -decided on starting Victoza 0.6 mg injection for first week, then increase to 1.2 mg per week. Denied family history of  multiple endocrine neoplasia  - With start of a toes a, can decrease Amaryl back to 4 mg daily. Monitor for hypoglycemia symptoms, follow-up in 3 months for recheck A1c.  Hyperlipidemia, unspecified hyperlipidemia type  -Tolerating new statin, check CMP. Lipids reviewed from recent testing.  Peripheral polyneuropathy  -Improved with gabapentin. Continue same dose, recheck in 3 months.  Meds ordered this encounter  Medications  . DISCONTD: simvastatin (ZOCOR) 20 MG tablet    Sig: Take 20 mg by mouth daily.  Marland Kitchen liraglutide (VICTOZA) 18 MG/3ML SOPN    Sig: Inject 0.2 mLs (1.2 mg total) into the skin once a week. Start with 0.'6mg'$  (0.4m) for first week.    Dispense:  3 mL    Refill:  1   Patient Instructions    Decrease glimepiride to '4mg'$  each day, and start Victoza once per week (initially 0.165m then increase to 0.83m74mn the second week).  Watch for low blood sugar symptoms. No other med changes for now.    Hypoglycemia Hypoglycemia occurs when the level of sugar (glucose) in the blood is too low. Glucose is a type of sugar that provides the body's main source of energy. Certain hormones (insulin and glucagon) control the level of glucose in the blood. Insulin lowers blood glucose, and glucagon increases blood glucose. Hypoglycemia can result from having too much insulin in the bloodstream, or from not eating enough food that contains glucose. Hypoglycemia can happen in people who do or do not have diabetes. It can develop quickly, and it can be a medical emergency. What are the causes? Hypoglycemia occurs most often in people who have diabetes. If you have diabetes, hypoglycemia may be caused by:  Diabetes medicine.  Not eating enough, or not eating often enough.  Increased physical activity.  Drinking alcohol, especially when you have not eaten recently.  If you do not have diabetes, hypoglycemia may be caused by:  A tumor in the pancreas. The pancreas is the organ that makes  insulin.  Not eating enough, or not eating for long periods at a time (fasting).  Severe infection or illness that affects the liver, heart, or kidneys.  Certain medicines.  You may also have reactive hypoglycemia. This condition causes hypoglycemia within 4 hours of eating a meal. This may occur after having stomach surgery. Sometimes, the cause of reactive hypoglycemia is not known. What increases the  risk? Hypoglycemia is more likely to develop in:  People who have diabetes and take medicines to lower blood glucose.  People who abuse alcohol.  People who have a severe illness.  What are the signs or symptoms? Hypoglycemia may not cause any symptoms. If you have symptoms, they may include:  Hunger.  Anxiety.  Sweating and feeling clammy.  Confusion.  Dizziness or feeling light-headed.  Sleepiness.  Nausea.  Increased heart rate.  Headache.  Blurry vision.  Seizure.  Nightmares.  Tingling or numbness around the mouth, lips, or tongue.  A change in speech.  Decreased ability to concentrate.  A change in coordination.  Restless sleep.  Tremors or shakes.  Fainting.  Irritability.  How is this diagnosed? Hypoglycemia is diagnosed with a blood test to measure your blood glucose level. This blood test is done while you are having symptoms. Your health care provider may also do a physical exam and review your medical history. If you do not have diabetes, other tests may be done to find the cause of your hypoglycemia. How is this treated? This condition can often be treated by immediately eating or drinking something that contains glucose, such as:  3-4 sugar tablets (glucose pills).  Glucose gel, 15-gram tube.  Fruit juice, 4 oz (120 mL).  Regular soda (not diet soda), 4 oz (120 mL).  Low-fat milk, 4 oz (120 mL).  Several pieces of hard candy.  Sugar or honey, 1 Tbsp.  Treating Hypoglycemia If You Have Diabetes  If you are alert and able  to swallow safely, follow the 15:15 rule:  Take 15 grams of a rapid-acting carbohydrate. Rapid-acting options include: ? 1 tube of glucose gel. ? 3 glucose pills. ? 6-8 pieces of hard candy. ? 4 oz (120 mL) of fruit juice. ? 4 oz (120 ml) of regular (not diet) soda.  Check your blood glucose 15 minutes after you take the carbohydrate.  If the repeat blood glucose level is still at or below 70 mg/dL (3.9 mmol/L), take 15 grams of a carbohydrate again.  If your blood glucose level does not increase above 70 mg/dL (3.9 mmol/L) after 3 tries, seek emergency medical care.  After your blood glucose level returns to normal, eat a meal or a snack within 1 hour.  Treating Severe Hypoglycemia Severe hypoglycemia is when your blood glucose level is at or below 54 mg/dL (3 mmol/L). Severe hypoglycemia is an emergency. Do not wait to see if the symptoms will go away. Get medical help right away. Call your local emergency services (911 in the [place] Do not drive yourself to the hospital. If you have severe hypoglycemia and you cannot eat or drink, you may need an injection of glucagon. A family member or close friend should learn how to check your blood glucose and how to give you a glucagon injection. Ask your health care provider if you need to have an emergency glucagon injection kit available. Severe hypoglycemia may need to be treated in a hospital. The treatment may include getting glucose through an IV tube. You may also need treatment for the cause of your hypoglycemia. Follow these instructions at home: General instructions  Avoid any diets that cause you to not eat enough food. Talk with your health care provider before you start any new diet.  Take over-the-counter and prescription medicines only as told by your health care provider.  Limit alcohol intake to no more than 1 drink per day for nonpregnant women and 2 drinks per day  for men. One drink equals 12 oz of beer, 5 oz of wine, or 1 oz  of hard liquor.  Keep all follow-up visits as told by your health care provider. This is important. If You Have Diabetes:   Make sure you know the symptoms of hypoglycemia.  Always have a rapid-acting carbohydrate snack with you to treat low blood sugar.  Follow your diabetes management plan, as told by your health care provider. Make sure you: ? Take your medicines as directed. ? Follow your exercise plan. ? Follow your meal plan. Eat on time, and do not skip meals. ? Check your blood glucose as often as directed. Make sure to check your blood glucose before and after exercise. If you exercise longer or in a different way than usual, check your blood glucose more often. ? Follow your sick day plan whenever you cannot eat or drink normally. Make this plan in advance with your health care provider.  Share your diabetes management plan with people in your workplace, school, and household.  Check your urine for ketones when you are ill and as told by your health care provider.  Carry a medical alert card or wear medical alert jewelry. If You Have Reactive Hypoglycemia or Low Blood Sugar From Other Causes:  Monitor your blood glucose as told by your health care provider.  Follow instructions from your health care provider about eating or drinking restrictions. Contact a health care provider if:  You have problems keeping your blood glucose in your target range.  You have frequent episodes of hypoglycemia. Get help right away if:  You continue to have hypoglycemia symptoms after eating or drinking something containing glucose.  Your blood glucose is at or below 54 mg/dL (3 mmol/L).  You have a seizure.  You faint. These symptoms may represent a serious problem that is an emergency. Do not wait to see if the symptoms will go away. Get medical help right away. Call your local emergency services (911 in the [place] Do not drive yourself to the hospital. This information is not  intended to replace advice given to you by your health care provider. Make sure you discuss any questions you have with your health care provider. Document Released: 10/19/2005 Document Revised: 04/01/2016 Document Reviewed: 11/22/2015 Elsevier Interactive Patient Education  2018 [employer].    IF you received an x-ray today, you will receive an invoice from [facility]. Please contact [facility] at [phone] with questions or concerns regarding your invoice.   IF you received labwork today, you will receive an invoice from [facility]. Please contact [facility] at [phone] with questions or concerns regarding your invoice.   Our billing staff will not be able to assist you with questions regarding bills from these companies.  You will be contacted with the lab results as soon as they are available. The fastest way to get your results is to activate your My Chart account. Instructions are located on the last page of this paperwork. If you have not heard from [name] regarding the results in 2 weeks, please contact this office.       I personally performed the services described in this documentation, which was scribed in my presence. The recorded information has been reviewed and considered for accuracy and completeness, addended by me as needed, and agree with information above.  Signed,   [name], MD [facility] [place].  06/11/17 12:30 PM

## 2017-06-11 LAB — COMPREHENSIVE METABOLIC PANEL
ALT: 19 IU/L (ref 0–44)
AST: 27 IU/L (ref 0–40)
Albumin/Globulin Ratio: 1.6 (ref 1.2–2.2)
Albumin: 4.5 g/dL (ref 3.5–4.8)
Alkaline Phosphatase: 46 IU/L (ref 39–117)
BILIRUBIN TOTAL: 0.6 mg/dL (ref 0.0–1.2)
BUN/Creatinine Ratio: 23 (ref 10–24)
BUN: 42 mg/dL — ABNORMAL HIGH (ref 8–27)
CHLORIDE: 103 mmol/L (ref 96–106)
CO2: 21 mmol/L (ref 20–29)
Calcium: 9.4 mg/dL (ref 8.6–10.2)
Creatinine, Ser: 1.85 mg/dL — ABNORMAL HIGH (ref 0.76–1.27)
GFR, EST AFRICAN AMERICAN: 42 mL/min/{1.73_m2} — AB (ref >59)
GFR, EST NON AFRICAN AMERICAN: 36 mL/min/{1.73_m2} — AB (ref >59)
GLOBULIN, TOTAL: 2.8 g/dL (ref 1.5–4.5)
Glucose: 131 mg/dL — ABNORMAL HIGH (ref 65–99)
POTASSIUM: 4 mmol/L (ref 3.5–5.2)
SODIUM: 142 mmol/L (ref 134–144)
Total Protein: 7.3 g/dL (ref 6.0–8.5)

## 2017-06-11 LAB — MICROALBUMIN / CREATININE URINE RATIO
Creatinine, Urine: 95.1 mg/dL
MICROALB/CREAT RATIO: 9.7 mg/g{creat} (ref 0.0–30.0)
MICROALBUM., U, RANDOM: 9.2 ug/mL

## 2017-06-26 ENCOUNTER — Other Ambulatory Visit: Payer: Self-pay | Admitting: Family Medicine

## 2017-06-26 DIAGNOSIS — R7989 Other specified abnormal findings of blood chemistry: Secondary | ICD-10-CM

## 2017-06-30 ENCOUNTER — Other Ambulatory Visit: Payer: Self-pay | Admitting: Family Medicine

## 2017-06-30 DIAGNOSIS — N189 Chronic kidney disease, unspecified: Secondary | ICD-10-CM

## 2017-08-09 NOTE — Addendum Note (Signed)
Addended by: Evie Lacks on: 08/09/2017 06:18 PM   Modules accepted: Level of Service

## 2017-09-09 ENCOUNTER — Encounter: Payer: Self-pay | Admitting: Family Medicine

## 2017-09-09 ENCOUNTER — Ambulatory Visit: Payer: Medicare HMO | Admitting: Family Medicine

## 2017-09-09 VITALS — BP 138/76 | HR 76 | Temp 98.2°F | Resp 16 | Ht 67.5 in | Wt 249.4 lb

## 2017-09-09 DIAGNOSIS — E785 Hyperlipidemia, unspecified: Secondary | ICD-10-CM

## 2017-09-09 DIAGNOSIS — G6289 Other specified polyneuropathies: Secondary | ICD-10-CM

## 2017-09-09 DIAGNOSIS — E1121 Type 2 diabetes mellitus with diabetic nephropathy: Secondary | ICD-10-CM | POA: Diagnosis not present

## 2017-09-09 DIAGNOSIS — N189 Chronic kidney disease, unspecified: Secondary | ICD-10-CM | POA: Diagnosis not present

## 2017-09-09 DIAGNOSIS — Z1159 Encounter for screening for other viral diseases: Secondary | ICD-10-CM

## 2017-09-09 DIAGNOSIS — I10 Essential (primary) hypertension: Secondary | ICD-10-CM | POA: Diagnosis not present

## 2017-09-09 DIAGNOSIS — Z23 Encounter for immunization: Secondary | ICD-10-CM

## 2017-09-09 MED ORDER — ATORVASTATIN CALCIUM 10 MG PO TABS
10.0000 mg | ORAL_TABLET | Freq: Every day | ORAL | 1 refills | Status: DC
Start: 2017-09-09 — End: 2018-03-15

## 2017-09-09 MED ORDER — AMLODIPINE BESYLATE 2.5 MG PO TABS: 2.5000 mg | ORAL_TABLET | Freq: Every day | ORAL | 1 refills | Status: DC

## 2017-09-09 MED ORDER — METOPROLOL SUCCINATE ER 100 MG PO TB24: 100.0000 mg | ORAL_TABLET | Freq: Every day | ORAL | 1 refills | Status: DC

## 2017-09-09 MED ORDER — LOSARTAN POTASSIUM-HCTZ 100-25 MG PO TABS: 1.0000 | ORAL_TABLET | Freq: Every day | ORAL | 1 refills | Status: DC

## 2017-09-09 MED ORDER — GLIMEPIRIDE 4 MG PO TABS: 4.0000 mg | ORAL_TABLET | Freq: Every day | ORAL | 0 refills | Status: DC

## 2017-09-09 MED ORDER — GABAPENTIN 300 MG PO CAPS: 300.0000 mg | ORAL_CAPSULE | Freq: Every day | ORAL | 1 refills | Status: DC

## 2017-09-09 MED ORDER — GLIMEPIRIDE 4 MG PO TABS: 4.0000 mg | ORAL_TABLET | Freq: Every day | ORAL | 1 refills | Status: DC

## 2017-09-09 NOTE — Progress Notes (Signed)
Subjective:  By signing my name below, I, [name], attest that this documentation has been prepared under the direction and in the presence of [name], MD. Electronically Signed: [name], [place]. 09/09/2017 , 1:54 PM .  Patient was seen in Room 10 .   Patient ID: Timothy Martin, male    DOB: Jan 28, 1946, 71 y.o.   MRN: [id number] Chief Complaint  Patient presents with  . Diabetes    6 month follow up on DM   HPI Timothy Martin is a 71 y.o. male Here for follow up.   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 He was started on Victoza, so Amaryl was decreased to '4mg'$  QD. He mentions the Victoza was too expensive ($200), and didn't purchase it. He's only been taking his Amaryl '4mg'$  QD. He's been checking his blood sugars at home, ranging 110-140.   Vision: seen in march Foot: done in May.  Pneumonia shot: done in 2015.  Flu shot: done today Hep C screening: in lab today  Hyperlipidemia Lab Results  Component Value Date   CHOL 140 06/09/2017   HDL 29 (L) 06/09/2017   LDLCALC 57 06/09/2017   TRIG 269 (H) 06/09/2017   CHOLHDL 4.8 06/09/2017   Lab Results  Component Value Date   ALT 19 06/10/2017   AST 27 06/10/2017   ALKPHOS 46 06/10/2017   BILITOT 0.6 06/10/2017   He takes Lipitor '10mg'$  QD. He denies any new side effects with medications.   HTN He takes Losartan-HCTZ, toprol, and Norvasc. He denies any new side effects with his medications. He denies chest pain, shortness of breath, lightheadedness, or dizziness.   Chronic Kidney Disease Lab Results  Component Value Date   CREATININE 1.85 (H) 06/10/2017   His creatinine had increased  from 1.56 to 1.85 from May to August; had recommended him to return for lab only visit. He was also referred to nephrology.   He hasn't made an appointment with the nephrologist yet. He plans to call them to set up an appointment.   Peripheral neuropathy He takes gabapentin '300mg'$  QHS, stable at last visit. He states this is doing well with current medication, gabapentin '300mg'$  QHS. He describes his bilateral foot pain more of an irritation.   Patient Active Problem List   Diagnosis Date Noted  . Obesity (BMI 30-39.9) 08/03/2013  . Hypertension 03/29/2013  . DM (diabetes mellitus), type 2 with renal complications ([place]) 69/62/9528  . Elevated cholesterol 03/29/2013  . 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 Diabetes mellitus   . Heart murmur   . Hyperlipidemia   . Hypertension   . Kidney stone    Past Surgical History:  Procedure Laterality Date  . TONSILLECTOMY     Allergies  Allergen Reactions  . Codeine Other (See Comments)    CHILDHOOD  Allergic to all forms of Codeine   Jittery cannot function  . Augmentin [Amoxicillin-Pot Clavulanate] Rash   Prior to Admission medications   Medication Sig Start Date End Date Taking? Authorizing Provider  Alcohol Swabs PADS USE TO CHECK BLOOD SUGAR DAILY. DX CODE: 250.00 02/26/16  [name], MD  amLODipine (NORVASC) 2.5 MG tablet Take 1 tablet (2.5 mg total) by mouth daily. 03/04/17   [name], MD  atorvastatin (LIPITOR) 10 MG tablet Take 1 tablet (10 mg total) by mouth daily. 03/04/17   [name], MD  blood glucose meter kit and supplies KIT Dispense based on patient and insurance preference. Testing once per day. E11.29 06/07/17   [name], MD  Blood Glucose Monitoring Suppl (BLOOD GLUCOSE METER) kit Use to test blood sugar daily. Dx code: 250.00 05/18/13   [name], PA-C  fish oil-omega-3 fatty acids 1000 MG capsule Take 1 g by mouth 2 (two) times daily after a meal.     [provider]  gabapentin (NEURONTIN) 300 MG capsule TAKE 1 CAPSULE AT BEDTIME 06/08/17   [name], MD  glimepiride (AMARYL) 4 MG tablet Take 1 tablet (4 mg total) by mouth daily with breakfast. 03/04/17   [name], MD  glucose blood test strip Use as instructed one per day 06/03/17   [name], MD  Lancets MISC Use to test blood sugar daily. Dx code:250.00 03/04/17   [name], MD  liraglutide (VICTOZA) 18 MG/3ML SOPN Inject 0.2 mLs (1.2 mg total) into the skin once a week. Start with 0.'6mg'$  (0.1m) for first week. 06/10/17   GWendie Agreste MD  losartan-hydrochlorothiazide (HYZAAR) 100-25 MG tablet Take 1 tablet by mouth daily. 03/04/17   GWendie Agreste MD  metoprolol succinate (TOPROL-XL) 100 MG 24 hr tablet Take 1 tablet (100 mg total) by mouth daily. Take with or immediately following a meal. 03/04/17   GWendie Agreste MD  vitamin B-12 (CYANOCOBALAMIN) 100 MCG tablet Take 100 mcg by mouth every morning.     [provider]   Social History   Socioeconomic History  . Marital status: Married    Spouse name: Not on file  . Number of children: Not on file  . Years of education: Not on file  . Highest education level: Not on file  Social Needs  . Financial resource strain: Not on file  . Food insecurity - worry: Not on file  . Food insecurity - inability: Not on file  . Transportation needs - medical: Not on file  . Transportation needs - non-medical: Not on file  Occupational History  . Not on file  Tobacco Use  . Smoking status: Never Smoker  . Smokeless tobacco: Never Used  Substance and Sexual Activity  . Alcohol use: No  . Drug use: No  . Sexual activity: Not on file  Other Topics Concern  . Not on file  Social History Narrative  . Not on file   Review of Systems  Constitutional: Negative for fatigue and unexpected weight change.  Eyes: Negative for visual disturbance.  Respiratory: Negative for cough, chest tightness and  shortness of breath.   Cardiovascular: Negative for chest pain, palpitations and leg swelling.  Gastrointestinal: Negative for abdominal pain and blood in stool.  Neurological: Negative for dizziness, light-headedness and headaches.       Objective:   Physical Exam  Constitutional: He is oriented to person, place, and time. He appears well-developed and well-nourished.  HENT:  Head: Normocephalic and atraumatic.  Eyes: EOM are normal. Pupils are equal, round, and reactive to light.  Neck: No JVD present. Carotid bruit is not present.  Cardiovascular: Normal rate, regular rhythm and normal heart sounds.  No murmur heard. Pulmonary/Chest: Effort normal and breath  sounds normal. He has no rales.  Musculoskeletal: He exhibits no edema.  Neurological: He is alert and oriented to person, place, and time.  Skin: Skin is warm and dry.  Psychiatric: He has a normal mood and affect.  Vitals reviewed.   Vitals:   09/09/17 1333 09/09/17 1402  BP: (!) 144/78 138/76  Pulse: 76   Resp: 16   Temp: 98.2 F (36.8 C)   TempSrc: Oral   SpO2: 97%   Weight: 249 lb 6.4 oz (113.1 kg)   Height: 5' 7.5" (1.715 m)       Assessment & Plan:   Timothy Martin is a 71 y.o. male Type 2 diabetes mellitus with diabetic nephropathy, without long-term current use of insulin ([place]) - Plan: Hemoglobin A1c, glimepiride (AMARYL) 4 MG tablet, DISCONTINUED: glimepiride (AMARYL) 4 MG tablet  - Nonadherent to because it due to cost. Based on home readings will check A1c first to decide next step in treatment. Continue glimepiride 4 mg daily for now. Oral medication options somewhat limited with chronic kidney disease.  Essential hypertension - Plan: Basic metabolic panel, losartan-hydrochlorothiazide (HYZAAR) 100-25 MG tablet, metoprolol succinate (TOPROL-XL) 100 MG 24 hr tablet  - stable, but borderline with the diabetes recommendations of 130/80 or lower. No changes in dosing for now, continue to  monitor.  Chronic kidney disease, unspecified CKD stage - Plan: Basic metabolic panel  - Repeat BMP to check stability. Nephrology follow-up pending - advised patient to call and schedule  Other polyneuropathy  - Peripheral neuropathy stable with current gabapentin dosing. No changes  Hyperlipidemia, unspecified hyperlipidemia type  - Tolerating Lipitor, continue same dose.  Encounter for hepatitis C screening test for low risk patient - Plan: Hepatitis C antibody  Needs flu shot - Plan: Flu Vaccine QUAD 36+ mos IM   Meds ordered this encounter  Medications  . atorvastatin (LIPITOR) 10 MG tablet    Sig: Take 1 tablet (10 mg total) daily by mouth.    Dispense:  90 tablet    Refill:  1  . amLODipine (NORVASC) 2.5 MG tablet    Sig: Take 1 tablet (2.5 mg total) daily by mouth.    Dispense:  90 tablet    Refill:  1  . losartan-hydrochlorothiazide (HYZAAR) 100-25 MG tablet    Sig: Take 1 tablet daily by mouth.    Dispense:  90 tablet    Refill:  1  . metoprolol succinate (TOPROL-XL) 100 MG 24 hr tablet    Sig: Take 1 tablet (100 mg total) daily by mouth. Take with or immediately following a meal.    Dispense:  90 tablet    Refill:  1  . DISCONTD: glimepiride (AMARYL) 4 MG tablet    Sig: Take 1 tablet (4 mg total) daily with breakfast by mouth.    Dispense:  90 tablet    Refill:  1  . gabapentin (NEURONTIN) 300 MG capsule    Sig: Take 1 capsule (300 mg total) at bedtime by mouth.    Dispense:  90 capsule    Refill:  1  . glimepiride (AMARYL) 4 MG tablet    Sig: Take 1 tablet (4 mg total) daily with breakfast by mouth.    Dispense:  7 tablet    Refill:  0   Patient Instructions   If there are medicines that are too expensive - please let me know so we can look at other options. Depending on your A1c, can discuss next step.   No other med changes  for now.   Please call to schedule the follow up with nephrologist (kidney specialist).   Plan for 3 month follow up.   IF  you received an x-ray today, you will receive an invoice from [facility]. Please contact [facility] at [phone] with questions or concerns regarding your invoice.   IF you received labwork today, you will receive an invoice from [facility]. Please contact [facility] at [phone] with questions or concerns regarding your invoice.   Our billing staff will not be able to assist you with questions regarding bills from these companies.  You will be contacted with the lab results as soon as they are available. The fastest way to get your results is to activate your My Chart account. Instructions are located on the last page of this paperwork. If you have not heard from [name] regarding the results in 2 weeks, please contact this office.     I personally performed the services described in this documentation, which was scribed in my presence. The recorded information has been reviewed and considered for accuracy and completeness, addended by me as needed, and agree with information above.  Signed,   [name], MD [facility].  09/09/17 6:30 PM

## 2017-09-09 NOTE — Patient Instructions (Addendum)
If there are medicines that are too expensive - please let me know so we can look at other options. Depending on your A1c, can discuss next step.   No other med changes for now.   Please call to schedule the follow up with nephrologist (kidney specialist).   Plan for 3 month follow up.   IF you received an x-ray today, you will receive an invoice from [facility]. Please contact [facility] at [phone] with questions or concerns regarding your invoice.   IF you received labwork today, you will receive an invoice from [facility]. Please contact [facility] at [phone] with questions or concerns regarding your invoice.   Our billing staff will not be able to assist you with questions regarding bills from these companies.  You will be contacted with the lab results as soon as they are available. The fastest way to get your results is to activate your My Chart account. Instructions are located on the last page of this paperwork. If you have not heard from us regarding the results in 2 weeks, please contact this office.

## 2017-09-10 LAB — BASIC METABOLIC PANEL
BUN/Creatinine Ratio: 26 — ABNORMAL HIGH (ref 10–24)
BUN: 45 mg/dL — ABNORMAL HIGH (ref 8–27)
CALCIUM: 9.9 mg/dL (ref 8.6–10.2)
CO2: 22 mmol/L (ref 20–29)
CREATININE: 1.73 mg/dL — AB (ref 0.76–1.27)
Chloride: 100 mmol/L (ref 96–106)
GFR calc Af Amer: 45 mL/min/{1.73_m2} — ABNORMAL LOW (ref >59)
GFR, EST NON AFRICAN AMERICAN: 39 mL/min/{1.73_m2} — AB (ref >59)
GLUCOSE: 189 mg/dL — AB (ref 65–99)
POTASSIUM: 3.9 mmol/L (ref 3.5–5.2)
SODIUM: 141 mmol/L (ref 134–144)

## 2017-09-10 LAB — HEMOGLOBIN A1C
ESTIMATED AVERAGE GLUCOSE: 171 mg/dL
HEMOGLOBIN A1C: 7.6 % — AB (ref 4.8–5.6)

## 2017-09-10 LAB — HEPATITIS C ANTIBODY: Hep C Virus Ab: <0.1 s/co ratio (ref 0.0–0.9)

## 2017-10-07 ENCOUNTER — Telehealth: Payer: Self-pay | Admitting: Family Medicine

## 2017-10-07 NOTE — Telephone Encounter (Signed)
Noted. Can discuss further at next visit. He was advised to contact them at last OV.

## 2017-10-07 NOTE — Telephone Encounter (Signed)
 [facility] faxed us to let us know they have tried contacting the pt several times via phone and a letter and have not been able to reach him. They have closed the referral and stated if the pt needs a future appt, they will be happy to schedule him. They can be reached at [phone]

## 2017-12-06 ENCOUNTER — Ambulatory Visit: Payer: Medicare HMO

## 2017-12-07 ENCOUNTER — Ambulatory Visit (INDEPENDENT_AMBULATORY_CARE_PROVIDER_SITE_OTHER): Payer: Medicare HMO

## 2017-12-07 VITALS — BP 130/82 | HR 73 | Ht 68.0 in | Wt 246.5 lb

## 2017-12-07 DIAGNOSIS — Z0000 Encounter for general adult medical examination without abnormal findings: Secondary | ICD-10-CM | POA: Diagnosis not present

## 2017-12-07 NOTE — Patient Instructions (Addendum)
Mr. Timothy Martin , Thank you for taking time to come for your Medicare Wellness Visit. I appreciate your ongoing commitment to your health goals. Please review the following plan we discussed and let me know if I can assist you in the future.   Screening recommendations/referrals: Colonoscopy: up to date, next due 11/02/2021 Recommended yearly ophthalmology/optometry visit for glaucoma screening and checkup Recommended yearly dental visit for hygiene and checkup  Vaccinations: Influenza vaccine: up to date Pneumococcal vaccine: up to date Tdap vaccine: up to date, next due 11/02/2020 Shingles vaccine: Check with your pharmacy about receiving the Shingrix vaccine    Advanced directives: Advance directive discussed with you today. I have provided a copy for you to complete at home and have notarized. Once this is complete please bring a copy in to our office so we can scan it into your chart.   Conditions/risks identified:  Try to start eating a more healthier diet daily. (fruits and vegetables)   Next appointment: 12/14/17 @ 1:20 pm with Dr. Neva SeatGreene, Next AWV in 1 year   Preventive Care 65 Years and Older, Male Preventive care refers to lifestyle choices and visits with your health care provider that can promote health and wellness. What does preventive care include?  A yearly physical exam. This is also called an annual well check.  Dental exams once or twice a year.  Routine eye exams. Ask your health care provider how often you should have your eyes checked.  Personal lifestyle choices, including:  Daily care of your teeth and gums.  Regular physical activity.  Eating a healthy diet.  Avoiding tobacco and drug use.  Limiting alcohol use.  Practicing safe sex.  Taking low doses of aspirin every day.  Taking vitamin and mineral supplements as recommended by your health care provider. What happens during an annual well check? The services and screenings done by your health care  provider during your annual well check will depend on your age, overall health, lifestyle risk factors, and family history of disease. Counseling  Your health care provider may ask you questions about your:  Alcohol use.  Tobacco use.  Drug use.  Emotional well-being.  Home and relationship well-being.  Sexual activity.  Eating habits.  History of falls.  Memory and ability to understand (cognition).  Work and work Astronomerenvironment. Screening  You may have the following tests or measurements:  Height, weight, and BMI.  Blood pressure.  Lipid and cholesterol levels. These may be checked every 5 years, or more frequently if you are over 543 years old.  Skin check.  Lung cancer screening. You may have this screening every year starting at age 71 if you have a 30-pack-year history of smoking and currently smoke or have quit within the past 15 years.  Fecal occult blood test (FOBT) of the stool. You may have this test every year starting at age 71.  Flexible sigmoidoscopy or colonoscopy. You may have a sigmoidoscopy every 5 years or a colonoscopy every 10 years starting at age 71.  Prostate cancer screening. Recommendations will vary depending on your family history and other risks.  Hepatitis C blood test.  Hepatitis B blood test.  Sexually transmitted disease (STD) testing.  Diabetes screening. This is done by checking your blood sugar (glucose) after you have not eaten for a while (fasting). You may have this done every 1-3 years.  Abdominal aortic aneurysm (AAA) screening. You may need this if you are a current or former smoker.  Osteoporosis. You may be  screened starting at age 71 if you are at high risk. Talk with your health care provider about your test results, treatment options, and if necessary, the need for more tests. Vaccines  Your health care provider may recommend certain vaccines, such as:  Influenza vaccine. This is recommended every year.  Tetanus,  diphtheria, and acellular pertussis (Tdap, Td) vaccine. You may need a Td booster every 10 years.  Zoster vaccine. You may need this after age 360.  Pneumococcal 13-valent conjugate (PCV13) vaccine. One dose is recommended after age 71.  Pneumococcal polysaccharide (PPSV23) vaccine. One dose is recommended after age 71. Talk to your health care provider about which screenings and vaccines you need and how often you need them. This information is not intended to replace advice given to you by your health care provider. Make sure you discuss any questions you have with your health care provider. Document Released: 11/15/2015 Document Revised: 07/08/2016 Document Reviewed: 08/20/2015 Elsevier Interactive Patient Education  2017 [employer]  Fall Prevention in the Home Falls can cause injuries. They can happen to people of all ages. There are many things you can do to make your home safe and to help prevent falls. What can I do on the outside of my home?  Regularly fix the edges of walkways and driveways and fix any cracks.  Remove anything that might make you trip as you walk through a door, such as a raised step or threshold.  Trim any bushes or trees on the path to your home.  Use bright outdoor lighting.  Clear any walking paths of anything that might make someone trip, such as rocks or tools.  Regularly check to see if handrails are loose or broken. Make sure that both sides of any steps have handrails.  Any raised decks and porches should have guardrails on the edges.  Have any leaves, snow, or ice cleared regularly.  Use sand or salt on walking paths during winter.  Clean up any spills in your garage right away. This includes oil or grease spills. What can I do in the bathroom?  Use night lights.  Install grab bars by the toilet and in the tub and shower. Do not use towel bars as grab bars.  Use non-skid mats or decals in the tub or shower.  If you need to sit down in  the shower, use a plastic, non-slip stool.  Keep the floor dry. Clean up any water that spills on the floor as soon as it happens.  Remove soap buildup in the tub or shower regularly.  Attach bath mats securely with double-sided non-slip rug tape.  Do not have throw rugs and other things on the floor that can make you trip. What can I do in the bedroom?  Use night lights.  Make sure that you have a light by your bed that is easy to reach.  Do not use any sheets or blankets that are too big for your bed. They should not hang down onto the floor.  Have a firm chair that has side arms. You can use this for support while you get dressed.  Do not have throw rugs and other things on the floor that can make you trip. What can I do in the kitchen?  Clean up any spills right away.  Avoid walking on wet floors.  Keep items that you use a lot in easy-to-reach places.  If you need to reach something above you, use a strong step stool that has a  grab bar.  Keep electrical cords out of the way.  Do not use floor polish or wax that makes floors slippery. If you must use wax, use non-skid floor wax.  Do not have throw rugs and other things on the floor that can make you trip. What can I do with my stairs?  Do not leave any items on the stairs.  Make sure that there are handrails on both sides of the stairs and use them. Fix handrails that are broken or loose. Make sure that handrails are as long as the stairways.  Check any carpeting to make sure that it is firmly attached to the stairs. Fix any carpet that is loose or worn.  Avoid having throw rugs at the top or bottom of the stairs. If you do have throw rugs, attach them to the floor with carpet tape.  Make sure that you have a light switch at the top of the stairs and the bottom of the stairs. If you do not have them, ask someone to add them for you. What else can I do to help prevent falls?  Wear shoes that:  Do not have high  heels.  Have rubber bottoms.  Are comfortable and fit you well.  Are closed at the toe. Do not wear sandals.  If you use a stepladder:  Make sure that it is fully opened. Do not climb a closed stepladder.  Make sure that both sides of the stepladder are locked into place.  Ask someone to hold it for you, if possible.  Clearly mark and make sure that you can see:  Any grab bars or handrails.  First and last steps.  Where the edge of each step is.  Use tools that help you move around (mobility aids) if they are needed. These include:  Canes.  Walkers.  Scooters.  Crutches.  Turn on the lights when you go into a dark area. Replace any light bulbs as soon as they burn out.  Set up your furniture so you have a clear path. Avoid moving your furniture around.  If any of your floors are uneven, fix them.  If there are any pets around you, be aware of where they are.  Review your medicines with your doctor. Some medicines can make you feel dizzy. This can increase your chance of falling. Ask your doctor what other things that you can do to help prevent falls. This information is not intended to replace advice given to you by your health care provider. Make sure you discuss any questions you have with your health care provider. Document Released: 08/15/2009 Document Revised: 03/26/2016 Document Reviewed: 11/23/2014 Elsevier Interactive Patient Education  2017 [employer].

## 2017-12-07 NOTE — Progress Notes (Signed)
 Subjective:   Timothy Martin is a 71 y.o. male who presents for an Initial Medicare Annual Wellness Visit.  Review of Systems  N/A Cardiac Risk Factors include: advanced age (>55men, >65 women);diabetes mellitus;dyslipidemia;hypertension;obesity (BMI >30kg/m2);male gender    Objective:    Today's Vitals   12/07/17 1004  BP: 130/82  Pulse: 73  SpO2: 100%  Weight: 246 lb 8 oz (111.8 kg)  Height: 5' 8" (1.727 m)   Body mass index is 37.48 kg/m.  Advanced Directives 12/07/2017 06/10/2017 06/14/2015 05/04/2015  Does Patient Have a Medical Advance Directive? No No No No  Would patient like information on creating a medical advance directive? Yes (MAU/Ambulatory/Procedural Areas - Information given) No - Patient declined No - patient declined information -    Current Medications (verified) Outpatient Encounter Medications as of 12/07/2017  Medication Sig  . Alcohol Swabs PADS USE TO CHECK BLOOD SUGAR DAILY. DX CODE: 250.00  . amLODipine (NORVASC) 2.5 MG tablet Take 1 tablet (2.5 mg total) daily by mouth.  . atorvastatin (LIPITOR) 10 MG tablet Take 1 tablet (10 mg total) daily by mouth.  . blood glucose meter kit and supplies KIT Dispense based on patient and insurance preference. Testing once per day. E11.29  . Blood Glucose Monitoring Suppl (BLOOD GLUCOSE METER) kit Use to test blood sugar daily. Dx code: 250.00  . fish oil-omega-3 fatty acids 1000 MG capsule Take 1 g by mouth 2 (two) times daily after a meal.  . gabapentin (NEURONTIN) 300 MG capsule Take 1 capsule (300 mg total) at bedtime by mouth.  . glimepiride (AMARYL) 4 MG tablet Take 1 tablet (4 mg total) daily with breakfast by mouth.  . glucose blood test strip Use as instructed one per day  . Lancets MISC Use to test blood sugar daily. Dx code:250.00  . losartan-hydrochlorothiazide (HYZAAR) 100-25 MG tablet Take 1 tablet daily by mouth.  . metoprolol succinate (TOPROL-XL) 100 MG 24 hr tablet Take 1 tablet (100 mg total) daily  by mouth. Take with or immediately following a meal.  . vitamin B-12 (CYANOCOBALAMIN) 100 MCG tablet Take 100 mcg by mouth every morning.   . [DISCONTINUED] liraglutide (VICTOZA) 18 MG/3ML SOPN Inject 0.2 mLs (1.2 mg total) into the skin once a week. Start with 0.6mg (0.1ml) for first week.   No facility-administered encounter medications on file as of 12/07/2017.     Allergies (verified) Codeine and Augmentin [amoxicillin-pot clavulanate]   History: Past Medical History:  Diagnosis Date  . Diabetes mellitus   . Heart murmur   . Hyperlipidemia   . Hypertension   . Kidney stone    Past Surgical History:  Procedure Laterality Date  . CYSTOSCOPY WITH RETROGRADE PYELOGRAM, URETEROSCOPY AND STENT PLACEMENT Right 06/18/2015   Procedure: CYSTOSCOPY WITH RIGHT RETROGRADE PYELOGRAM, RIGHT URETEROSCOPY ;  Surgeon: John Wrenn, MD;  Location: [facility];  Service: Urology;  Laterality: Right;  . TONSILLECTOMY     Family History  Problem Relation Age of Onset  . Cancer Mother   . Cancer Father    Social History   Socioeconomic History  . Marital status: Married    Spouse name: None  . Number of children: None  . Years of education: None  . Highest education level: Some college, no degree  Social Needs  . Financial resource strain: Not hard at all  . Food insecurity - worry: Never true  . Food insecurity - inability: Never true  . Transportation needs - medical: No  . Transportation needs - non-medical:   No  Occupational History  . None  Tobacco Use  . Smoking status: Never Smoker  . Smokeless tobacco: Never Used  Substance and Sexual Activity  . Alcohol use: No  . Drug use: No  . Sexual activity: None  Other Topics Concern  . None  Social History Narrative  . None   Tobacco Counseling Counseling given: Not Answered   Clinical Intake:  Pre-visit preparation completed: Yes  Pain : No/denies pain     Nutritional Status: BMI > 30  Obese Nutritional Risks: None Diabetes:  Yes CBG done?: No Did pt. bring in CBG monitor from home?: No  How often do you need to have someone help you when you read instructions, pamphlets, or other written materials from your doctor or pharmacy?: 1 - Never What is the last grade level you completed in school?: Some college  Interpreter Needed?: No  Information entered by :: Andrez Grime, LPN  Activities of Daily Living In your present state of health, do you have any difficulty performing the following activities: 12/07/2017 06/10/2017  Hearing? N N  Vision? N Y  Difficulty concentrating or making decisions? Y N  Comment Patient has issues remembering things sometimes -  Walking or climbing stairs? N N  Dressing or bathing? N N  Doing errands, shopping? N N  Preparing Food and eating ? N -  Using the Toilet? N -  In the past six months, have you accidently leaked urine? N -  Do you have problems with loss of bowel control? N -  Managing your Medications? N -  Managing your Finances? N -  Housekeeping or managing your Housekeeping? N -  Some recent data might be hidden     Immunizations and Health Maintenance Immunization History  Administered Date(s) Administered  . Influenza Split 08/03/2013  . Influenza,inj,Quad PF,6+ Mos 08/03/2013, 11/16/2013, 07/19/2014, 08/26/2015, 08/27/2016, 09/09/2017  . Pneumococcal Conjugate-13 07/19/2014  . Pneumococcal Polysaccharide-23 08/02/2012  . Td 11/02/2010   There are no preventive care reminders to display for this patient.  Patient Care Team: [name], MD as PCP - General (Family Medicine)  Indicate any recent Medical Services you may have received from other than [facility] providers in the past year (date may be approximate).    Assessment:   This is a routine wellness examination for [place].  Hearing/Vision screen Hearing Screening Comments: Patient has never had a hearing exam  Vision Screening Comments: Patient sees his eye doctor once a year for his routine  eye exams.   Dietary issues and exercise activities discussed: Current Exercise Habits: Structured exercise class, Type of exercise: treadmill(exercise bike), Time (Minutes): 30, Frequency (Times/Week): 4, Weekly Exercise (Minutes/Week): 120, Intensity: Mild, Exercise limited by: None identified  Goals    . DIET - EAT MORE FRUITS AND VEGETABLES     Patient states that he wants to start eating a more healthier diet daily.       Depression Screen PHQ 2/9 Scores 12/07/2017 06/10/2017 03/04/2017 12/03/2016  PHQ - 2 Score 0 0 0 0    Fall Risk Fall Risk  12/07/2017 06/10/2017 03/04/2017 12/03/2016 08/27/2016  Falls in the past year? _0     Is the patient's home free of loose throw rugs in walkways, pet beds, electrical cords, etc?   yes      Grab bars in the bathroom? yes      Handrails on the stairs?   no      Adequate lighting?   yes  Timed Get Up and Go performed: yes, completed within 30 seconds   Cognitive Function:     6CIT Screen 12/07/2017  What Year? 0 points  What month? 0 points  What time? 0 points  Count back from 20 0 points  Months in reverse 0 points  Repeat phrase 0 points  Total Score 0    Screening Tests Health Maintenance  Topic Date Due  . OPHTHALMOLOGY EXAM  01/11/2018  . FOOT EXAM  03/04/2018  . HEMOGLOBIN A1C  03/09/2018  . TETANUS/TDAP  03/15/2021  . COLONOSCOPY  11/02/2021  . INFLUENZA VACCINE  Completed  . Hepatitis C Screening  Completed  . PNA vac Low Risk Adult  Completed    Qualifies for Shingles Vaccine? Advised patient to check with his pharmacy about receiving the Shingrix vaccine   Cancer Screenings: Lung: Low Dose CT Chest recommended if Age 82-80 years, 30 pack-year currently smoking OR have quit w/in 15years. Patient does not qualify. Colorectal: colonoscopy completed 11/03/2011  Additional Screenings:  Hepatitis B/HIV/Syphillis: not indicated  Hepatitis C Screening: completed 09/09/2017  Since it has not been 90 days since last  HgbA1C as of today, patient decided to wait until his appointment next week to have all his blood work completed.      Plan:   I have personally reviewed and noted the following in the patient's chart:   . Medical and social history . Use of alcohol, tobacco or illicit drugs  . Current medications and supplements . Functional ability and status . Nutritional status . Physical activity . Advanced directives . List of other physicians . Hospitalizations, surgeries, and ER visits in previous 12 months . Vitals . Screenings to include cognitive, depression, and falls . Referrals and appointments  In addition, I have reviewed and discussed with patient certain preventive protocols, quality metrics, and best practice recommendations. A written personalized care plan for preventive services as well as general preventive health recommendations were provided to patient.   1. Encounter for Medicare annual wellness exam    Andrez Grime, LPN   04/03/8314

## 2017-12-14 ENCOUNTER — Encounter: Payer: Self-pay | Admitting: Family Medicine

## 2017-12-14 ENCOUNTER — Other Ambulatory Visit: Payer: Self-pay

## 2017-12-14 ENCOUNTER — Ambulatory Visit: Payer: Medicare HMO

## 2017-12-14 ENCOUNTER — Ambulatory Visit (INDEPENDENT_AMBULATORY_CARE_PROVIDER_SITE_OTHER): Payer: Medicare HMO | Admitting: Family Medicine

## 2017-12-14 VITALS — BP 150/70 | HR 82 | Temp 98.1°F | Resp 17 | Ht 68.0 in | Wt 246.2 lb

## 2017-12-14 DIAGNOSIS — R7989 Other specified abnormal findings of blood chemistry: Secondary | ICD-10-CM | POA: Diagnosis not present

## 2017-12-14 DIAGNOSIS — E1142 Type 2 diabetes mellitus with diabetic polyneuropathy: Secondary | ICD-10-CM | POA: Diagnosis not present

## 2017-12-14 DIAGNOSIS — I10 Essential (primary) hypertension: Secondary | ICD-10-CM | POA: Diagnosis not present

## 2017-12-14 DIAGNOSIS — N189 Chronic kidney disease, unspecified: Secondary | ICD-10-CM | POA: Diagnosis not present

## 2017-12-14 DIAGNOSIS — E785 Hyperlipidemia, unspecified: Secondary | ICD-10-CM

## 2017-12-14 LAB — POCT GLYCOSYLATED HEMOGLOBIN (HGB A1C): HEMOGLOBIN A1C: 8

## 2017-12-14 LAB — BASIC METABOLIC PANEL

## 2017-12-14 MED ORDER — DULAGLUTIDE 0.75 MG/0.5ML ~~LOC~~ SOAJ: 0.7500 mg | SUBCUTANEOUS | 1 refills | Status: DC

## 2017-12-14 NOTE — Progress Notes (Addendum)
Subjective:  By signing my name below, I, [name], attest that this documentation has been prepared under the direction and in the presence of [name], MD Electronically Signed: Ladene Artist, ED Scribe 12/14/2017 at 1:48 PM.   Patient ID: Timothy Martin, male    DOB: September 25, 1946, 72 y.o.   MRN: [id number]  Chief Complaint  Patient presents with  . Diabetes    3 month recheck   HPI Timothy Martin is a 72 y.o. male who presents to Primary Care at [facility] for f/u.  DM with h/o CKD and Peripheral Neuropathy  Lab Results  Component Value Date   HGBA1C 7.6 (H) 09/09/2017  Victoza was too expensive. Was taking amaryl 4 mg qd at last visit, was increased to 6 mg qd. Micro albumin in Aug 2018. Optho March 2018. Foot exam May 2018.  Pt reports a temporary bump in blood glucose to 220s when he was ill with fever, chills, congestion for a few days last week. Average blood glucose readings of 130-150s. Denies side-effects from medication.  CKD  Lab Results  Component Value Date   CREATININE 1.73 (H) 09/09/2017  Slightly improved from 1.85 previously.  Peripheral Neuropathy Gabapentin 300 mg qhs.  Hyperlipidemia Lab Results  Component Value Date   CHOL 140 06/09/2017   HDL 29 (L) 06/09/2017   LDLCALC 57 06/09/2017   TRIG 269 (H) 06/09/2017   CHOLHDL 4.8 06/09/2017   Lab Results  Component Value Date   ALT 19 06/10/2017   AST 27 06/10/2017   ALKPHOS 46 06/10/2017   BILITOT 0.6 06/10/2017  Lipitor 10 mg qd. Exercising 3 days/wk.  HTN Hyzaar and Toprol. Denies any side-effects.  Patient Active Problem List   Diagnosis Date Noted  . Obesity (BMI 30-39.9) 08/03/2013  . Hypertension 03/29/2013  . DM (diabetes mellitus), type 2 with renal complications ([place]) 71/24/5809  . Elevated cholesterol 03/29/2013  . 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 Diabetes mellitus   . Heart murmur   . Hyperlipidemia   . Hypertension   .  Kidney stone    Past Surgical History:  Procedure Laterality Date  . CYSTOSCOPY WITH RETROGRADE PYELOGRAM, URETEROSCOPY AND STENT PLACEMENT Right 06/18/2015   Procedure: CYSTOSCOPY WITH RIGHT RETROGRADE PYELOGRAM, RIGHT URETEROSCOPY ;  Surgeon: [name], MD;  Location: [facility];  Service: Urology;  Laterality: Right;  . TONSILLECTOMY     Allergies  Allergen Reactions  . Codeine Other (See Comments)    CHILDHOOD  Allergic to all forms of Codeine   Jittery cannot function  . Augmentin [Amoxicillin-Pot Clavulanate] Rash   Prior to Admission medications   Medication Sig Start Date End Date Taking? Authorizing Provider  Alcohol Swabs PADS USE TO CHECK BLOOD SUGAR DAILY. DX CODE: 250.00 02/26/16  Yes [name], MD  amLODipine (NORVASC) 2.5 MG tablet Take 1 tablet (2.5 mg total) daily by mouth. 09/09/17  Yes [name], MD  atorvastatin (LIPITOR) 10 MG tablet Take 1 tablet (10 mg total) daily by mouth. 09/09/17  Yes [name], MD  blood glucose meter kit and supplies KIT Dispense based on patient and insurance preference. Testing once per day. E11.29 06/07/17  Yes [name], MD  Blood Glucose Monitoring Suppl (BLOOD GLUCOSE METER) kit Use to test blood sugar daily. Dx code: 250.00 05/18/13  Yes [name], [name], PA-C  fish oil-omega-3 fatty acids 1000 MG capsule Take 1 g by mouth 2 (two) times daily after a  meal.   Yes [provider]  gabapentin (NEURONTIN) 300 MG capsule Take 1 capsule (300 mg total) at bedtime by mouth. 09/09/17  Yes [name], MD  glimepiride (AMARYL) 4 MG tablet Take 1 tablet (4 mg total) daily with breakfast by mouth. 09/09/17  Yes [name], MD  glucose blood test strip Use as instructed one per day 06/03/17  Yes [name], MD  Lancets MISC Use to test blood sugar daily. Dx code:250.00 03/04/17  Yes [name], MD  losartan-hydrochlorothiazide (HYZAAR) 100-25 MG tablet Take 1 tablet daily by mouth. 09/09/17  Yes  [name], MD  metoprolol succinate (TOPROL-XL) 100 MG 24 hr tablet Take 1 tablet (100 mg total) daily by mouth. Take with or immediately following a meal. 09/09/17  Yes [name], MD  Multiple Vitamins-Minerals (MULTIVITAMIN WITH MINERALS) tablet Take 1 tablet by mouth daily.   Yes [provider]  vitamin B-12 (CYANOCOBALAMIN) 100 MCG tablet Take 100 mcg by mouth every morning.     [provider]   Social History   Socioeconomic History  . Marital status: Married    Spouse name: Not on file  . Number of children: Not on file  . Years of education: Not on file  . Highest education level: Some college, no degree  Social Needs  . Financial resource strain: Not hard at all  . Food insecurity - worry: Never true  . Food insecurity - inability: Never true  . Transportation needs - medical: No  . Transportation needs - non-medical: No  Occupational History  . Not on file  Tobacco Use  . Smoking status: Never Smoker  . Smokeless tobacco: Never Used  Substance and Sexual Activity  . Alcohol use: No  . Drug use: No  . Sexual activity: Not on file  Other Topics Concern  . Not on file  Social History Narrative  . Not on file   Review of Systems  Constitutional: Negative for fatigue and unexpected weight change.  Eyes: Negative for visual disturbance.  Respiratory: Negative for cough, chest tightness and shortness of breath.   Cardiovascular: Negative for chest pain, palpitations and leg swelling.  Gastrointestinal: Negative for abdominal pain and blood in stool.  Neurological: Negative for dizziness, light-headedness and headaches.      Objective:   Physical Exam  Constitutional: He is oriented to person, place, and time. He appears well-developed and well-nourished.  HENT:  Head: Normocephalic and atraumatic.  Eyes: EOM are normal. Pupils are equal, round, and reactive to light.  Neck: No JVD present. Carotid bruit is not present.    Cardiovascular: Normal rate, regular rhythm and normal heart sounds.  No murmur heard. Pulmonary/Chest: Effort normal and breath sounds normal. He has no rales.  Musculoskeletal: He exhibits no edema.  Neurological: He is alert and oriented to person, place, and time.  Skin: Skin is warm and dry.  Psychiatric: He has a normal mood and affect.  Vitals reviewed.  Vitals:   12/14/17 1323  BP: (!) 150/70  Pulse: 82  Resp: 17  Temp: 98.1 F (36.7 C)  TempSrc: Oral  SpO2: 98%  Weight: 246 lb 3.2 oz (111.7 kg)  Height: _0  (1.727 m)   Results for orders placed or performed in visit on 12/14/17  POCT glycosylated hemoglobin (Hb A1C)  Result Value Ref Range   Hemoglobin A1C 8.0       Assessment & Plan:   Keiyon Plack is a 72 y.o. male  Type 2 diabetes mellitus with diabetic polyneuropathy, without long-term current use of insulin ([place]) - Plan: POCT glycosylated hemoglobin (Hb A1C), Microalbumin, urine, Dulaglutide (TRULICITY) 1.50 VW/9.7XY   - Decreased control, would prefer to add another agent. Discussed Trulicity as option, depending on cost. If that is cost prohibitive, can look at other agents.   - continue amaryl same dose for now, but may need to decrease dose if starting injectable  -Teaching with demonstration unit was performed  Chronic kidney disease, unspecified CKD stage  -Check labs for monitoring. New referral placed to nephrology  Essential hypertension - Plan: Comprehensive metabolic panel  -Tolerating current regimen, mildly elevated in office today, previously controlled including at recent Medicare annual wellness exam. Check labs, no changes for now.  Hyperlipidemia, unspecified hyperlipidemia type - Plan: Lipid panel  - Tolerating Lipitor, repeat labs. No change in regimen for now.    Meds ordered this encounter  Medications  . Dulaglutide (TRULICITY) 8.01 KP/5.3ZS SOPN    Sig: Inject 0.75 mg into the skin once a week.    Dispense:  6 mL     Refill:  1   Patient Instructions   Unfortunately your A1c has increased. Watch diet, continue exercise and I would recommend adding a medicine like Trulicity if that is not too costly. Let me know if that medicine will be ok with the cost. If you do start trulicity, watch for low blood sugar symptoms. Follow-up in 3 months.  I referred you to nephrology again. I will check some baseline blood work again today.   IF you received an x-ray today, you will receive an invoice from [facility]. Please contact [facility] at [phone] with questions or concerns regarding your invoice.   IF you received labwork today, you will receive an invoice from [place]. Please contact [facility] at [phone] with questions or concerns regarding your invoice.   Our billing staff will not be able to assist you with questions regarding bills from these companies.  You will be contacted with the lab results as soon as they are available. The fastest way to get your results is to activate your My Chart account. Instructions are located on the last page of this paperwork. If you have not heard from [name] regarding the results in 2 weeks, please contact this office.      I personally performed the services described in this documentation, which was scribed in my presence. The recorded information has been reviewed and considered for accuracy and completeness, addended by me as needed, and agree with information above.  Signed,   [name], MD [facility].  12/16/17 10:47 PM

## 2017-12-14 NOTE — Patient Instructions (Addendum)
Unfortunately your A1c has increased. Watch diet, continue exercise and I would recommend adding a medicine like Trulicity if that is not too costly. Let me know if that medicine will be ok with the cost. If you do start trulicity, watch for low blood sugar symptoms. Follow-up in 3 months.  I referred you to nephrology again. I will check some baseline blood work again today.   IF you received an x-ray today, you will receive an invoice from [facility]. Please contact [facility] at [phone] with questions or concerns regarding your invoice.   IF you received labwork today, you will receive an invoice from [facility]. Please contact [facility] at [phone] with questions or concerns regarding your invoice.   Our billing staff will not be able to assist you with questions regarding bills from these companies.  You will be contacted with the lab results as soon as they are available. The fastest way to get your results is to activate your My Chart account. Instructions are located on the last page of this paperwork. If you have not heard from us regarding the results in 2 weeks, please contact this office.

## 2017-12-15 LAB — MICROALBUMIN, URINE: Microalbumin, Urine: 52.3 ug/mL

## 2017-12-21 ENCOUNTER — Encounter: Payer: Self-pay | Admitting: *Deleted

## 2018-01-06 LAB — COMPREHENSIVE METABOLIC PANEL
A/G RATIO: 1.5 (ref 1.2–2.2)
ALBUMIN: 4.3 g/dL (ref 3.5–4.8)
ALK PHOS: 52 IU/L (ref 39–117)
ALT: 25 IU/L (ref 0–44)
AST: 28 IU/L (ref 0–40)
BILIRUBIN TOTAL: 0.8 mg/dL (ref 0.0–1.2)
BUN / CREAT RATIO: 23 (ref 10–24)
BUN: 39 mg/dL — ABNORMAL HIGH (ref 8–27)
CHLORIDE: 100 mmol/L (ref 96–106)
CO2: 23 mmol/L (ref 20–29)
Calcium: 9.4 mg/dL (ref 8.6–10.2)
Creatinine, Ser: 1.7 mg/dL — ABNORMAL HIGH (ref 0.76–1.27)
GFR calc non Af Amer: 40 mL/min/{1.73_m2} — ABNORMAL LOW (ref >59)
GFR, EST AFRICAN AMERICAN: 46 mL/min/{1.73_m2} — AB (ref >59)
Globulin, Total: 2.9 g/dL (ref 1.5–4.5)
Glucose: 182 mg/dL — ABNORMAL HIGH (ref 65–99)
Potassium: 3.9 mmol/L (ref 3.5–5.2)
Sodium: 141 mmol/L (ref 134–144)
TOTAL PROTEIN: 7.2 g/dL (ref 6.0–8.5)

## 2018-01-06 LAB — LIPID PANEL
CHOLESTEROL TOTAL: 123 mg/dL (ref 100–199)
Chol/HDL Ratio: 4.9 ratio (ref 0.0–5.0)
HDL: 25 mg/dL — ABNORMAL LOW (ref >39)
LDL Calculated: 51 mg/dL (ref 0–99)
Triglycerides: 235 mg/dL — ABNORMAL HIGH (ref 0–149)
VLDL CHOLESTEROL CAL: 47 mg/dL — AB (ref 5–40)

## 2018-01-06 LAB — SPECIMEN STATUS REPORT

## 2018-02-08 DIAGNOSIS — H5211 Myopia, right eye: Secondary | ICD-10-CM | POA: Diagnosis not present

## 2018-02-08 LAB — HM DIABETES EYE EXAM

## 2018-02-21 DIAGNOSIS — H5213 Myopia, bilateral: Secondary | ICD-10-CM | POA: Diagnosis not present

## 2018-02-21 DIAGNOSIS — H52209 Unspecified astigmatism, unspecified eye: Secondary | ICD-10-CM | POA: Diagnosis not present

## 2018-02-21 DIAGNOSIS — H524 Presbyopia: Secondary | ICD-10-CM | POA: Diagnosis not present

## 2018-02-21 DIAGNOSIS — H5203 Hypermetropia, bilateral: Secondary | ICD-10-CM | POA: Diagnosis not present

## 2018-03-01 DIAGNOSIS — E113513 Type 2 diabetes mellitus with proliferative diabetic retinopathy with macular edema, bilateral: Secondary | ICD-10-CM | POA: Diagnosis not present

## 2018-03-01 DIAGNOSIS — H3582 Retinal ischemia: Secondary | ICD-10-CM | POA: Diagnosis not present

## 2018-03-01 DIAGNOSIS — H2513 Age-related nuclear cataract, bilateral: Secondary | ICD-10-CM | POA: Diagnosis not present

## 2018-03-03 ENCOUNTER — Encounter: Payer: Self-pay | Admitting: *Deleted

## 2018-03-03 DIAGNOSIS — E08311 Diabetes mellitus due to underlying condition with unspecified diabetic retinopathy with macular edema: Secondary | ICD-10-CM

## 2018-03-03 DIAGNOSIS — E11319 Type 2 diabetes mellitus with unspecified diabetic retinopathy without macular edema: Secondary | ICD-10-CM | POA: Insufficient documentation

## 2018-03-15 ENCOUNTER — Encounter: Payer: Self-pay | Admitting: Family Medicine

## 2018-03-15 ENCOUNTER — Ambulatory Visit (INDEPENDENT_AMBULATORY_CARE_PROVIDER_SITE_OTHER): Payer: Medicare HMO | Admitting: Family Medicine

## 2018-03-15 ENCOUNTER — Other Ambulatory Visit: Payer: Self-pay

## 2018-03-15 VITALS — BP 144/76 | HR 96 | Temp 97.4°F | Ht 68.0 in | Wt 247.4 lb

## 2018-03-15 DIAGNOSIS — G6289 Other specified polyneuropathies: Secondary | ICD-10-CM

## 2018-03-15 DIAGNOSIS — E785 Hyperlipidemia, unspecified: Secondary | ICD-10-CM

## 2018-03-15 DIAGNOSIS — E1142 Type 2 diabetes mellitus with diabetic polyneuropathy: Secondary | ICD-10-CM

## 2018-03-15 DIAGNOSIS — E1121 Type 2 diabetes mellitus with diabetic nephropathy: Secondary | ICD-10-CM

## 2018-03-15 DIAGNOSIS — E1122 Type 2 diabetes mellitus with diabetic chronic kidney disease: Secondary | ICD-10-CM

## 2018-03-15 DIAGNOSIS — I10 Essential (primary) hypertension: Secondary | ICD-10-CM | POA: Diagnosis not present

## 2018-03-15 DIAGNOSIS — L608 Other nail disorders: Secondary | ICD-10-CM | POA: Diagnosis not present

## 2018-03-15 LAB — BASIC METABOLIC PANEL
BUN/Creatinine Ratio: 20 (ref 10–24)
BUN: 35 mg/dL — ABNORMAL HIGH (ref 8–27)
CO2: 20 mmol/L (ref 20–29)
CREATININE: 1.75 mg/dL — AB (ref 0.76–1.27)
Calcium: 9.6 mg/dL (ref 8.6–10.2)
Chloride: 102 mmol/L (ref 96–106)
GFR calc Af Amer: 44 mL/min/{1.73_m2} — ABNORMAL LOW (ref >59)
GFR, EST NON AFRICAN AMERICAN: 38 mL/min/{1.73_m2} — AB (ref >59)
GLUCOSE: 156 mg/dL — AB (ref 65–99)
Potassium: 3.8 mmol/L (ref 3.5–5.2)
SODIUM: 139 mmol/L (ref 134–144)

## 2018-03-15 LAB — HEMOGLOBIN A1C
Est. average glucose Bld gHb Est-mCnc: 160 mg/dL
HEMOGLOBIN A1C: 7.2 % — AB (ref 4.8–5.6)

## 2018-03-15 MED ORDER — AMLODIPINE BESYLATE 5 MG PO TABS: 5.0000 mg | ORAL_TABLET | Freq: Every day | ORAL | 2 refills | Status: DC

## 2018-03-15 MED ORDER — GABAPENTIN 300 MG PO CAPS: 300.0000 mg | ORAL_CAPSULE | Freq: Every day | ORAL | 1 refills | Status: DC

## 2018-03-15 MED ORDER — DULAGLUTIDE 0.75 MG/0.5ML ~~LOC~~ SOAJ: 0.7500 mg | SUBCUTANEOUS | 1 refills | Status: DC

## 2018-03-15 MED ORDER — METOPROLOL SUCCINATE ER 100 MG PO TB24: 100.0000 mg | ORAL_TABLET | Freq: Every day | ORAL | 1 refills | Status: DC

## 2018-03-15 MED ORDER — GLIMEPIRIDE 4 MG PO TABS: 4.0000 mg | ORAL_TABLET | Freq: Every day | ORAL | 0 refills | Status: DC

## 2018-03-15 MED ORDER — ATORVASTATIN CALCIUM 10 MG PO TABS: 10.0000 mg | ORAL_TABLET | Freq: Every day | ORAL | 1 refills | Status: DC

## 2018-03-15 MED ORDER — LOSARTAN POTASSIUM-HCTZ 100-25 MG PO TABS: 1.0000 | ORAL_TABLET | Freq: Every day | ORAL | 1 refills | Status: DC

## 2018-03-15 NOTE — Patient Instructions (Addendum)
Increase amlodipine to 5 mg once per day for improved blood pressure control.  Watch for any lightheadedness or dizziness with that medication change or increased swelling of the legs.  Continue other medications including Trulicity for diabetes.  Toenail may be bruising with new toenail appearing below.  If that is not improving within the next 4 to 6 weeks with new toenail moving in, or any worsening sooner, please return for recheck.  Thanks for coming in today. See you in 3 months.    IF you received an x-ray today, you will receive an invoice from [facility]. Please contact [facility] at [phone] with questions or concerns regarding your invoice.   IF you received labwork today, you will receive an invoice from [place]. Please contact [facility] at [phone] with questions or concerns regarding your invoice.   Our billing staff will not be able to assist you with questions regarding bills from these companies.  You will be contacted with the lab results as soon as they are available. The fastest way to get your results is to activate your My Chart account. Instructions are located on the last page of this paperwork. If you have not heard from [name] regarding the results in 2 weeks, please contact this office.

## 2018-03-15 NOTE — Progress Notes (Signed)
Subjective:  By signing my name below, I, Timothy Martin, attest that this documentation has been prepared under the direction and in the presence of [name], MD Electronically Signed: Ladene Artist, ED Scribe 03/15/2018 at 8:29 AM.   Patient ID: Timothy Martin, male    DOB: 02-21-1946, 72 y.o.   MRN: [id number]  Chief Complaint  Patient presents with  . Diabetes    3 m f/u & refills on all meds  . black toe nail    right big foot toe nail. x2 months    HPI Timothy Martin is a 72 y.o. male who presents to Primary Care at [facility] for f/u.  DM Lab Results  Component Value Date   HGBA1C 8.0 12/14/2017  Urine micro urine albumin 2/12 was 52.3. Discussed adding Trulicity at last visit. Was continued on Amaryl 4 mg qd. H/o CKD and peripheral neuropathy with DM. Pt is on a statin, Lipitor 10 mg. Taking ARB. Ophto 02/22/18.  Foot exam done today. Pneumovax 07/19/14. - Pt has been on Trulicity for the past 2 months. He has been checking his blood glucose with readings of 125-135. Denies any side-effects from meds. Pt states that swelling was noted on his eye exam and a procedure was advised, however, his insurance declined to pay for the procedure. He has a f/u appointment to discuss other options. Lab Results  Component Value Date   CHOL 123 12/14/2017   HDL 25 (L) 12/14/2017   LDLCALC 51 12/14/2017   TRIG 235 (H) 12/14/2017   CHOLHDL 4.9 12/14/2017   Diabetic Foot Exam - Simple   Simple Foot Form Diabetic Foot exam was performed with the following findings:  Yes 03/15/2018  8:18 AM  Visual Inspection No deformities, no ulcerations, no other skin breakdown bilaterally:  Yes Sensation Testing Intact to touch and monofilament testing bilaterally:  Yes Pulse Check Posterior Tibialis and Dorsalis pulse intact bilaterally:  Yes Comments Numbness tingling off and on going on a year.      Peripheral Neuropathy Gabapentin 300 mg qhs.  HTN Hyzaar and Toprol. - Pt has been  checking his BP at home with readings of 130s/80s. Denies any side-effects.  CKD Referred to nephrology. - Pt still has not heard from nephrology. Lab Results  Component Value Date   CREATININE CANCELED 12/14/2017   CREATININE 1.70 (H) 12/14/2017   R Great Toe Nail Pt reports darkening to the R great toenail x 2 months. Denies injury or pain. No treatments tried PTA.  Patient Active Problem List   Diagnosis Date Noted  . Diabetic retinopathy ([place]) 03/03/2018  . Obesity (BMI 30-39.9) 08/03/2013  . Hypertension 03/29/2013  . DM (diabetes mellitus), type 2 with renal complications ([place]) 60/73/7106  . Elevated cholesterol 03/29/2013  . 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 Diabetes mellitus   . Heart murmur   . Hyperlipidemia   . Hypertension   . Kidney stone    Past Surgical History:  Procedure Laterality Date  . CYSTOSCOPY WITH RETROGRADE PYELOGRAM, URETEROSCOPY AND STENT PLACEMENT Right 06/18/2015   Procedure: CYSTOSCOPY WITH RIGHT RETROGRADE PYELOGRAM, RIGHT URETEROSCOPY ;  Surgeon: [name], MD;  Location: [facility];  Service: Urology;  Laterality: Right;  . TONSILLECTOMY     Allergies  Allergen Reactions  . Codeine Other (See Comments)    CHILDHOOD  Allergic to all forms of Codeine   Jittery cannot function  . Augmentin [Amoxicillin-Pot Clavulanate] Rash   Prior to Admission medications  Medication Sig Start Date End Date Taking? Authorizing Provider  Alcohol Swabs PADS USE TO CHECK BLOOD SUGAR DAILY. DX CODE: 250.00 02/26/16   [name], MD  amLODipine (NORVASC) 2.5 MG tablet Take 1 tablet (2.5 mg total) daily by mouth. 09/09/17   [name], MD  atorvastatin (LIPITOR) 10 MG tablet Take 1 tablet (10 mg total) daily by mouth. 09/09/17   [name], MD  blood glucose meter kit and supplies KIT Dispense based on patient and insurance preference. Testing once per day. E11.29 06/07/17   [name], MD  Blood  Glucose Monitoring Suppl (BLOOD GLUCOSE METER) kit Use to test blood sugar daily. Dx code: 250.00 05/18/13   [name], PA-C  Dulaglutide (TRULICITY) 3.78 HY/8.5OY SOPN Inject 0.75 mg into the skin once a week. 12/14/17   [name], MD  fish oil-omega-3 fatty acids 1000 MG capsule Take 1 g by mouth 2 (two) times daily after a meal.    [provider]  gabapentin (NEURONTIN) 300 MG capsule Take 1 capsule (300 mg total) at bedtime by mouth. 09/09/17   [name], MD  glimepiride (AMARYL) 4 MG tablet Take 1 tablet (4 mg total) daily with breakfast by mouth. 09/09/17   [name], MD  glucose blood test strip Use as instructed one per day 06/03/17   [name], MD  Lancets MISC Use to test blood sugar daily. Dx code:250.00 03/04/17   [name], MD  losartan-hydrochlorothiazide (HYZAAR) 100-25 MG tablet Take 1 tablet daily by mouth. 09/09/17   [name], MD  metoprolol succinate (TOPROL-XL) 100 MG 24 hr tablet Take 1 tablet (100 mg total) daily by mouth. Take with or immediately following a meal. 09/09/17   [name], MD  Multiple Vitamins-Minerals (MULTIVITAMIN WITH MINERALS) tablet Take 1 tablet by mouth daily.    [provider]  vitamin B-12 (CYANOCOBALAMIN) 100 MCG tablet Take 100 mcg by mouth every morning.     [provider]   Social History   Socioeconomic History  . Marital status: Married    Spouse name: Not on file  . Number of children: Not on file  . Years of education: Not on file  . Highest education level: Some college, no degree  Occupational History  . Not on file  Social Needs  . Financial resource strain: Not hard at all  . Food insecurity:    Worry: Never true    Inability: Never true  . Transportation needs:    Medical: No    Non-medical: No  Tobacco Use  . Smoking status: Never Smoker  . Smokeless tobacco: Never Used  Substance and Sexual Activity  . Alcohol use: No  . Drug use: No  .  Sexual activity: Not on file  Lifestyle  . Physical activity:    Days per week: 4 days    Minutes per session: 30 min  . Stress: Not at all  Relationships  . Social connections:    Talks on phone: More than three times a week    Gets together: More than three times a week    Attends religious service: Never    Active member of club or organization: Yes    Attends meetings of clubs or organizations: More than 4 times per year    Relationship status: Married  . Intimate partner violence:    Fear of current or ex partner: No    Emotionally abused: No  Physically abused: No    Forced sexual activity: No  Other Topics Concern  . Not on file  Social History Narrative  . Not on file   Review of Systems  Constitutional: Negative for fatigue and unexpected weight change.  Eyes: Negative for visual disturbance.  Respiratory: Negative for cough, chest tightness and shortness of breath.   Cardiovascular: Negative for chest pain, palpitations and leg swelling.  Gastrointestinal: Negative for abdominal pain and blood in stool.  Skin: Positive for color change (R great toenail).  Neurological: Negative for dizziness, light-headedness and headaches.      Objective:   Physical Exam  Constitutional: He is oriented to person, place, and time. He appears well-developed and well-nourished.  HENT:  Head: Normocephalic and atraumatic.  Eyes: Pupils are equal, round, and reactive to light. EOM are normal.  Neck: No JVD present. Carotid bruit is not present.  Cardiovascular: Normal rate, regular rhythm and normal heart sounds.  No murmur heard. Pulmonary/Chest: Effort normal and breath sounds normal. He has no rales.  Musculoskeletal: He exhibits no edema.  Neurological: He is alert and oriented to person, place, and time.  Skin: Skin is warm and dry.  R toe: thickened appears to have possible ecchymosis subungual hematoma /darkening at the mid aspect of R great toe. Thickening aspect of nail  appears to be pushing out with new nail formation.  Psychiatric: He has a normal mood and affect.  Vitals reviewed.  Vitals:   03/15/18 0809 03/15/18 0844  BP: (!) 158/70 (!) 144/76  Pulse: 96   Temp: (!) 97.4 F (36.3 C)   TempSrc: Oral   SpO2: 98%   Weight: 247 lb 6.4 oz (112.2 kg)   Height: '5\' 8"'$  (1.727 m)       Assessment & Plan:   Nguyen Todorov is a 72 y.o. male Type 2 diabetes mellitus with chronic kidney disease, without long-term current use of insulin, unspecified CKD stage ([place]) - Plan: Ambulatory referral to Nephrology, Basic metabolic panel Type 2 diabetes mellitus with diabetic nephropathy, without long-term current use of insulin (H[facility]) - Plan: Hemoglobin A1c, glimepiride (AMARYL) 4 MG tablet Type 2 diabetes mellitus with diabetic polyneuropathy, without long-term current use of insulin ([facility]) - Plan: gabapentin (NEURONTIN) 300 MG capsule, Dulaglutide (TRULICITY) 3.66 QH/4.7ML SOPN Other polyneuropathy  -Improved home readings, tolerating Trulicity.  Continue Amaryl 4 milligrams per day, Trulicity at current dose, check A1c.  -Continue gabapentin for peripheral neuropathy, referral placed again to nephrology for evaluation of chronic kidney disease likely related to diabetes and hypertension.  Will improve hypertension control as below.  Essential hypertension - Plan: Ambulatory referral to Nephrology, losartan-hydrochlorothiazide (HYZAAR) 100-25 MG tablet, metoprolol succinate (TOPROL-XL) 100 MG 24 hr tablet, amLODipine (NORVASC) 5 MG tablet, Basic metabolic panel  -Goal of less than 130/80 with diabetes and chronic kidney disease.  Increase amlodipine to 5 mg daily, continue same dose of Toprol and Hyzaar for now.  Check BMP.  Hyperlipidemia, unspecified hyperlipidemia type - Plan: atorvastatin (LIPITOR) 10 MG tablet  -Tolerating Lipitor, continue same dose.  Plan on labs next visit  Toenail deformity  -Appears to have possible subungual hematoma versus partial  avulsion of toenail with new toenail moving him from below.  Does not have signs of infection at this time, nontender.  Monitor at home for the next 4 to 6 weeks for changes, follow-up if no improvement, sooner if worse  Meds ordered this encounter  Medications  . losartan-hydrochlorothiazide (HYZAAR) 100-25 MG tablet    Sig: Take  1 tablet by mouth daily.    Dispense:  90 tablet    Refill:  1  . metoprolol succinate (TOPROL-XL) 100 MG 24 hr tablet    Sig: Take 1 tablet (100 mg total) by mouth daily. Take with or immediately following a meal.    Dispense:  90 tablet    Refill:  1  . glimepiride (AMARYL) 4 MG tablet    Sig: Take 1 tablet (4 mg total) by mouth daily with breakfast.    Dispense:  7 tablet    Refill:  0  . gabapentin (NEURONTIN) 300 MG capsule    Sig: Take 1 capsule (300 mg total) by mouth at bedtime.    Dispense:  90 capsule    Refill:  1  . Dulaglutide (TRULICITY) 4.62 TV/4.7XG SOPN    Sig: Inject 0.75 mg into the skin once a week.    Dispense:  6 mL    Refill:  1  . atorvastatin (LIPITOR) 10 MG tablet    Sig: Take 1 tablet (10 mg total) by mouth daily.    Dispense:  90 tablet    Refill:  1  . amLODipine (NORVASC) 5 MG tablet    Sig: Take 1 tablet (5 mg total) by mouth daily.    Dispense:  90 tablet    Refill:  2   Patient Instructions   Increase amlodipine to 5 mg once per day for improved blood pressure control.  Watch for any lightheadedness or dizziness with that medication change or increased swelling of the legs.  Continue other medications including Trulicity for diabetes.  Toenail may be bruising with new toenail appearing below.  If that is not improving within the next 4 to 6 weeks with new toenail moving in, or any worsening sooner, please return for recheck.  Thanks for coming in today. See you in 3 months.    IF you received an x-ray today, you will receive an invoice from [facility]. Please contact [facility] at [phone] with  questions or concerns regarding your invoice.   IF you received labwork today, you will receive an invoice from [facility]. Please contact [facility] at [phone] with questions or concerns regarding your invoice.   Our billing staff will not be able to assist you with questions regarding bills from these companies.  You will be contacted with the lab results as soon as they are available. The fastest way to get your results is to activate your My Chart account. Instructions are located on the last page of this paperwork. If you have not heard from [name] regarding the results in 2 weeks, please contact this office.       I personally performed the services described in this documentation, which was scribed in my presence. The recorded information has been reviewed and considered for accuracy and completeness, addended by me as needed, and agree with information above.  Signed,   [name], MD Primary [facility] at [place].  03/15/18 8:50 AM

## 2018-03-17 ENCOUNTER — Other Ambulatory Visit: Payer: Self-pay

## 2018-03-17 ENCOUNTER — Ambulatory Visit (INDEPENDENT_AMBULATORY_CARE_PROVIDER_SITE_OTHER): Payer: Medicare HMO | Admitting: Family Medicine

## 2018-03-17 ENCOUNTER — Encounter: Payer: Self-pay | Admitting: Family Medicine

## 2018-03-17 VITALS — BP 146/74 | HR 91 | Temp 97.8°F | Resp 18 | Ht 68.0 in | Wt 245.0 lb

## 2018-03-17 DIAGNOSIS — M436 Torticollis: Secondary | ICD-10-CM

## 2018-03-17 MED ORDER — TIZANIDINE HCL 4 MG PO TABS: 4.0000 mg | ORAL_TABLET | Freq: Four times a day (QID) | ORAL | 0 refills | Status: DC | PRN

## 2018-03-17 NOTE — Progress Notes (Signed)
Subjective:  By signing my name below, I, Timothy Martin, attest that this documentation has been prepared under the direction and in the presence of Timothy Cheadle, MD. Electronically Signed: Moises Martin, [place]. 03/17/2018 , 11:00 AM .  Patient was seen in Room 2 .   Patient ID: Timothy Martin, male    DOB: 06/13/1946, 72 y.o.   MRN: [id number] Chief Complaint  Patient presents with  . Neck Pain    x3 days, Pt states pain started out has right shoulder and went into the middle of neck. Pt states it hurts to turn his head. Pt states no cramping. Pt states it feels like a pulled muscle.   HPI Timothy Martin is a 72 y.o. male who presents to Primary Care at [facility] complaining of neck pain that started 3 days ago. Patient was seen by Dr. [name] 2 days ago for his chronic medical care with review of diabetes, chronic kidney disease, and abnormal toe nail. BP was elevated. He is on gabapentin for peripheral neuropathy, and amlodipine was increased to '5mg'$ .   Patient states having some neck pain after waking up 3 days ago. He usually doesn't have headaches or have neck stiffness. His wife believes patient is stressed because he has an eye procedure done tomorrow, done by Dr. [name]. His wife also denies any ticks found. He's tried OTC tylenol-arthritis, applied Bengay cream over the area, and sleeping in a horseshoe neck pillow for some relief. He denies pain radiating down into his arms, weakness or numbness. He denies any new changes in pillows, mattress or sleep location prior to neck pain. His last headache was about 30 years ago.   Past Medical History:  Diagnosis Date  . Diabetes mellitus   . Heart murmur   . Hyperlipidemia   . Hypertension   . Kidney stone    Past Surgical History:  Procedure Laterality Date  . CYSTOSCOPY WITH RETROGRADE PYELOGRAM, URETEROSCOPY AND STENT PLACEMENT Right 06/18/2015   Procedure: CYSTOSCOPY WITH RIGHT RETROGRADE PYELOGRAM, RIGHT URETEROSCOPY ;  Surgeon:  [name], MD;  Location: [facility];  Service: Urology;  Laterality: Right;  . TONSILLECTOMY     Prior to Admission medications   Medication Sig Start Date End Date Taking? Authorizing Provider  Alcohol Swabs PADS USE TO CHECK Martin SUGAR DAILY. DX CODE: 250.00 02/26/16   [name], MD  amLODipine (NORVASC) 5 MG tablet Take 1 tablet (5 mg total) by mouth daily. 03/15/18   [name], MD  atorvastatin (LIPITOR) 10 MG tablet Take 1 tablet (10 mg total) by mouth daily. 03/15/18   [name], MD  Martin glucose meter kit and supplies KIT Dispense based on patient and insurance preference. Testing once per day. E11.29 06/07/17   [name], MD  Martin Glucose Monitoring Suppl (Martin GLUCOSE METER) kit Use to test Martin sugar daily. Dx code: 250.00 05/18/13   [name], PA-C  Dulaglutide (TRULICITY) 0.53 ZJ/6.7HA SOPN Inject 0.75 mg into the skin once a week. 03/15/18   [name], MD  fish oil-omega-3 fatty acids 1000 MG capsule Take 1 g by mouth 2 (two) times daily after a meal.    [provider]  gabapentin (NEURONTIN) 300 MG capsule Take 1 capsule (300 mg total) by mouth at bedtime. 03/15/18   [name], MD  glimepiride (AMARYL) 4 MG tablet Take 1 tablet (4 mg total) by mouth daily with breakfast. 03/15/18   [name], MD  glucose Martin test  strip Use as instructed one per day 06/03/17   [name], MD  Lancets MISC Use to test Martin sugar daily. Dx code:250.00 03/04/17   [name], MD  losartan-hydrochlorothiazide (HYZAAR) 100-25 MG tablet Take 1 tablet by mouth daily. 03/15/18   [name], MD  metoprolol succinate (TOPROL-XL) 100 MG 24 hr tablet Take 1 tablet (100 mg total) by mouth daily. Take with or immediately following a meal. 03/15/18   [name], MD  Multiple Vitamins-Minerals (MULTIVITAMIN WITH MINERALS) tablet Take 1 tablet by mouth daily.    [provider]  vitamin B-12 (CYANOCOBALAMIN) 100 MCG  tablet Take 100 mcg by mouth every morning.     [provider]   Allergies  Allergen Reactions  . Codeine Other (See Comments)    CHILDHOOD  Allergic to all forms of Codeine   Jittery cannot function  . Augmentin [Amoxicillin-Pot Clavulanate] Rash   Family History  Problem Relation Age of Onset  . Cancer Mother   . Cancer Father    Social History   Socioeconomic History  . Marital status: Married    Spouse name: Not on file  . Number of children: Not on file  . Years of education: Not on file  . Highest education level: Some college, no degree  Occupational History  . Not on file  Social Needs  . Financial resource strain: Not hard at all  . Food insecurity:    Worry: Never true    Inability: Never true  . Transportation needs:    Medical: No    Non-medical: No  Tobacco Use  . Smoking status: Never Smoker  . Smokeless tobacco: Never Used  Substance and Sexual Activity  . Alcohol use: No  . Drug use: No  . Sexual activity: Not on file  Lifestyle  . Physical activity:    Days per week: 4 days    Minutes per session: 30 min  . Stress: Not at all  Relationships  . Social connections:    Talks on phone: More than three times a week    Gets together: More than three times a week    Attends religious service: Never    Active member of club or organization: Yes    Attends meetings of clubs or organizations: More than 4 times per year    Relationship status: Married  Other Topics Concern  . Not on file  Social History Narrative  . Not on file   Depression screen [facility] 2/9 03/17/2018 03/15/2018 12/14/2017 12/07/2017 06/10/2017  Decreased Interest 0 0 0 0 0  Down, Depressed, Hopeless 0 0 0 0 0  PHQ - 2 Score 0 0 0 0 0    Review of Systems  Constitutional: Negative for fatigue and unexpected weight change.  Eyes: Negative for visual disturbance.  Respiratory: Negative for cough, chest tightness and shortness of breath.   Cardiovascular: Negative for chest pain,  palpitations and leg swelling.  Gastrointestinal: Negative for abdominal pain and Martin in stool.  Musculoskeletal: Positive for myalgias, neck pain and neck stiffness.  Neurological: Negative for dizziness, weakness, light-headedness, numbness and headaches.       Objective:   Physical Exam  Constitutional: He is oriented to person, place, and time. He appears well-developed and well-nourished. No distress.  HENT:  Head: Normocephalic and atraumatic.  Eyes: Pupils are equal, round, and reactive to light. EOM are normal.  Neck: Neck supple.  Cardiovascular: Normal rate.  Pulmonary/Chest: Effort normal. No respiratory distress.  Musculoskeletal: Normal range of motion.  C-spine: no point tenderness over cervical spinous process, but does have tingling with palpation of the right cervical paraspinal, no tenderness at the occipital groove, no palpable spasm of the left trapezius, no tenderness over the acromion, scapula, and clavicle C-spine ROM: roughly 20 degrees flexion, minimal extension, left rotation 10 degrees, left lateral flexion 0 degrees Shoulders: no restriction in ROM bilaterally; 5/5 upper extremity strength  Lymphadenopathy:    He has no cervical adenopathy.  Neurological: He is alert and oriented to person, place, and time.  Reflex Scores:      Tricep reflexes are 2+ on the right side and 2+ on the left side.      Bicep reflexes are 2+ on the right side and 2+ on the left side.      Brachioradialis reflexes are 2+ on the right side and 2+ on the left side. Skin: Skin is warm and dry.  Psychiatric: He has a normal mood and affect. His behavior is normal.  Nursing note and vitals reviewed.   BP (!) 146/74 (BP Location: Left Arm, Patient Position: Sitting, Cuff Size: Normal)   Pulse 91   Temp 97.8 F (36.6 C) (Oral)   Resp 18   Ht '5\' 8"'$  (1.727 m)   Wt 245 lb (111.1 kg)   SpO2 100%   BMI 37.25 kg/m      Assessment & Plan:   1. Acute torticollis     Meds  ordered this encounter  Medications  . tiZANidine (ZANAFLEX) 4 MG tablet    Sig: Take 1 tablet (4 mg total) by mouth every 6 (six) hours as needed for muscle spasms.    Dispense:  30 tablet    Refill:  0   I personally performed the services described in this documentation, which was scribed in my presence. The recorded information has been reviewed and considered, and addended by me as needed.   Timothy Martin, M.D.  Primary Care at [facility] [address] [place], [place] [address] [phone] phone [phone] fax  08/10/18 10:31 PM

## 2018-03-17 NOTE — Patient Instructions (Addendum)
IF you received an x-ray today, you will receive an invoice from [facility]. Please contact [facility] at [phone] with questions or concerns regarding your invoice.   IF you received labwork today, you will receive an invoice from [facility]. Please contact [facility] at [phone] with questions or concerns regarding your invoice.   Our billing staff will not be able to assist you with questions regarding bills from these companies.  You will be contacted with the lab results as soon as they are available. The fastest way to get your results is to activate your My Chart account. Instructions are located on the last page of this paperwork. If you have not heard from us regarding the results in 2 weeks, please contact this office.     Acute Torticollis, Adult Torticollis is a condition in which the muscles of the neck tighten (contract) abnormally, causing the neck to twist and the head to move into an unnatural position. Torticollis that develops suddenly is called acute torticollis. People with acute torticollis may have trouble turning their head. The condition can be painful and may range from mild to severe. What are the causes? This condition may be caused by:  Sleeping in an awkward position (common).  Extending or twisting the neck muscles beyond their normal position.  An injury to the neck muscles.  An infection.  A tumor.  Certain medicines.  Long-lasting spasms of the neck muscles.  In some cases, the cause may not be known. What increases the risk? You are more likely to develop this condition if:  You have a condition associated with loose ligaments, such as Down syndrome.  You have a brain condition that affects vision, such as strabismus.  What are the signs or symptoms? The main symptom of this condition is tilting of the head to one side. Other symptoms include:  Pain in the neck.  Trouble turning the head from side to side or  up and down.  How is this diagnosed? This condition may be diagnosed based on:  A physical exam.  Your medical history.  Imaging tests, such as: ? An X-ray. ? An ultrasound. ? A CT scan. ? An MRI.  How is this treated? Treatment for this condition depends on what is causing the condition. Mild cases may go away without treatment. Treatment for more serious cases may include:  Medicines or shots to relax the muscles.  Other medicines, such as antibiotics to treat the underlying cause.  Wearing a soft neck collar.  Physical therapy and stretching to improve neck strength and flexibility.  Neck massage.  In severe cases, surgery may be needed to repair dislocated or broken bones or to treat nerves in the neck. Follow these instructions at home:  Take over-the-counter and prescription medicines only as told by your health care provider.  Do stretching exercises and massage your neck as told by your health care provider.  If directed, apply heat to the affected area as often as told by your health care provider. Use the heat source that your health care provider recommends, such as a moist heat pack or a heating pad. ? Place a towel between your skin and the heat source. ? Leave the heat on for 20-30 minutes. ? Remove the heat if your skin turns bright red. This is especially important if you are unable to feel pain, heat, or cold. You may have a greater risk of getting burned.  If you wake up with torticollis after sleeping, check  your bed or sleeping area. Look for lumpy pillows or unusual objects. Make sure your bed and sleeping area are comfortable.  Keep all follow-up visits as told by your health care provider. This is important. Contact a health care provider if:  You have a fever.  Your symptoms do not improve or they get worse. Get help right away if:  You have trouble breathing.  You develop noisy breathing (stridor).  You start to drool.  You have trouble  swallowing or pain when swallowing.  You develop numbness or weakness in your hands or feet.  You have changes in your speech, understanding, or vision.  You are in severe pain.  You cannot move your head or neck. Summary  Torticollis is a condition in which the muscles of the neck tighten (contract) abnormally, causing the neck to twist and the head to move into an unnatural position. Torticollis that develops suddenly is called acute torticollis.  Treatment for this condition depends on what is causing the condition. Mild cases may go away without treatment.  Do stretching exercises and massage your neck as told by your health care provider. You may also be instructed to apply heat to the area.  Contact your health care provider if your symptoms do not improve or they get worse. This information is not intended to replace advice given to you by your health care provider. Make sure you discuss any questions you have with your health care provider. Document Released: 10/16/2000 Document Revised: 12/17/2016 Document Reviewed: 12/17/2016 Elsevier Interactive Patient Education  2018 [employer]  Neck Exercises Neck exercises can be important for many reasons:  They can help you to improve and maintain flexibility in your neck. This can be especially important as you age.  They can help to make your neck stronger. This can make movement easier.  They can reduce or prevent neck pain.  They may help your upper back.  Ask your health care provider which neck exercises would be best for you. Exercises Neck Press Repeat this exercise 10 times. Do it first thing in the morning and right before bed or as told by your health care provider. 1. Lie on your back on a firm bed or on the floor with a pillow under your head. 2. Use your neck muscles to push your head down on the pillow and straighten your spine. 3. Hold the position as well as you can. Keep your head facing up and your chin  tucked. 4. Slowly count to 5 while holding this position. 5. Relax for a few seconds. Then repeat.  Isometric Strengthening Do a full set of these exercises 2 times a day or as told by your health care provider. 1. Sit in a supportive chair and place your hand on your forehead. 2. Push forward with your head and neck while pushing back with your hand. Hold for 10 seconds. 3. Relax. Then repeat the exercise 3 times. 4. Next, do thesequence again, this time putting your hand against the back of your head. Use your head and neck to push backward against the hand pressure. 5. Finally, do the same exercise on either side of your head, pushing sideways against the pressure of your hand.  Prone Head Lifts Repeat this exercise 5 times. Do this 2 times a day or as told by your health care provider. 1. Lie face-down, resting on your elbows so that your chest and upper back are raised. 2. Start with your head facing downward, near your chest.  Position your chin either on or near your chest. 3. Slowly lift your head upward. Lift until you are looking straight ahead. Then continue lifting your head as far back as you can stretch. 4. Hold your head up for 5 seconds. Then slowly lower it to your starting position.  Supine Head Lifts Repeat this exercise 8-10 times. Do this 2 times a day or as told by your health care provider. 1. Lie on your back, bending your knees to point to the ceiling and keeping your feet flat on the floor. 2. Lift your head slowly off the floor, raising your chin toward your chest. 3. Hold for 5 seconds. 4. Relax and repeat.  Scapular Retraction Repeat this exercise 5 times. Do this 2 times a day or as told by your health care provider. 1. Stand with your arms at your sides. Look straight ahead. 2. Slowly pull both shoulders backward and downward until you feel a stretch between your shoulder blades in your upper back. 3. Hold for 10-30 seconds. 4. Relax and  repeat.  Contact a health care provider if:  Your neck pain or discomfort gets much worse when you do an exercise.  Your neck pain or discomfort does not improve within 2 hours after you exercise. If you have any of these problems, stop exercising right away. Do not do the exercises again unless your health care provider says that you can. Get help right away if:  You develop sudden, severe neck pain. If this happens, stop exercising right away. Do not do the exercises again unless your health care provider says that you can. Exercises Neck Stretch  Repeat this exercise 3-5 times. 1. Do this exercise while standing or while sitting in a chair. 2. Place your feet flat on the floor, shoulder-width apart. 3. Slowly turn your head to the right. Turn it all the way to the right so you can look over your right shoulder. Do not tilt or tip your head. 4. Hold this position for 10-30 seconds. 5. Slowly turn your head to the left, to look over your left shoulder. 6. Hold this position for 10-30 seconds.  Neck Retraction Repeat this exercise 8-10 times. Do this 3-4 times a day or as told by your health care provider. 1. Do this exercise while standing or while sitting in a sturdy chair. 2. Look straight ahead. Do not bend your neck. 3. Use your fingers to push your chin backward. Do not bend your neck for this movement. Continue to face straight ahead. If you are doing the exercise properly, you will feel a slight sensation in your throat and a stretch at the back of your neck. 4. Hold the stretch for 1-2 seconds. Relax and repeat.  This information is not intended to replace advice given to you by your health care provider. Make sure you discuss any questions you have with your health care provider. Document Released: 09/30/2015 Document Revised: 03/26/2016 Document Reviewed: 04/29/2015 Elsevier Interactive Patient Education  [employer].

## 2018-03-18 DIAGNOSIS — E113513 Type 2 diabetes mellitus with proliferative diabetic retinopathy with macular edema, bilateral: Secondary | ICD-10-CM | POA: Diagnosis not present

## 2018-03-22 ENCOUNTER — Encounter: Payer: Self-pay | Admitting: *Deleted

## 2018-04-04 ENCOUNTER — Other Ambulatory Visit: Payer: Self-pay

## 2018-04-04 DIAGNOSIS — E1121 Type 2 diabetes mellitus with diabetic nephropathy: Secondary | ICD-10-CM

## 2018-04-04 MED ORDER — GLIMEPIRIDE 4 MG PO TABS: 4.0000 mg | ORAL_TABLET | Freq: Every day | ORAL | 0 refills | Status: DC

## 2018-04-29 DIAGNOSIS — E113513 Type 2 diabetes mellitus with proliferative diabetic retinopathy with macular edema, bilateral: Secondary | ICD-10-CM | POA: Diagnosis not present

## 2018-04-29 DIAGNOSIS — H2513 Age-related nuclear cataract, bilateral: Secondary | ICD-10-CM | POA: Diagnosis not present

## 2018-05-04 DIAGNOSIS — R3914 Feeling of incomplete bladder emptying: Secondary | ICD-10-CM | POA: Diagnosis not present

## 2018-05-04 DIAGNOSIS — I129 Hypertensive chronic kidney disease with stage 1 through stage 4 chronic kidney disease, or unspecified chronic kidney disease: Secondary | ICD-10-CM | POA: Diagnosis not present

## 2018-05-04 DIAGNOSIS — Z87442 Personal history of urinary calculi: Secondary | ICD-10-CM | POA: Diagnosis not present

## 2018-05-04 DIAGNOSIS — N183 Chronic kidney disease, stage 3 (moderate): Secondary | ICD-10-CM | POA: Diagnosis not present

## 2018-05-04 DIAGNOSIS — D631 Anemia in chronic kidney disease: Secondary | ICD-10-CM | POA: Diagnosis not present

## 2018-05-13 ENCOUNTER — Other Ambulatory Visit: Payer: Self-pay | Admitting: Nephrology

## 2018-05-13 DIAGNOSIS — N183 Chronic kidney disease, stage 3 unspecified: Secondary | ICD-10-CM

## 2018-05-17 ENCOUNTER — Ambulatory Visit
Admission: RE | Admit: 2018-05-17 | Discharge: 2018-05-17 | Disposition: A | Discharge status: Home / Self Care | Payer: Medicare HMO | Source: Ambulatory Visit | Attending: Nephrology | Admitting: Nephrology

## 2018-05-17 DIAGNOSIS — N183 Chronic kidney disease, stage 3 unspecified: Secondary | ICD-10-CM

## 2018-05-17 DIAGNOSIS — N281 Cyst of kidney, acquired: Secondary | ICD-10-CM | POA: Diagnosis not present

## 2018-05-17 DIAGNOSIS — N189 Chronic kidney disease, unspecified: Secondary | ICD-10-CM | POA: Diagnosis not present

## 2018-05-18 ENCOUNTER — Telehealth: Payer: Self-pay | Admitting: Family Medicine

## 2018-05-18 NOTE — Telephone Encounter (Signed)
Copied from CRM [phone]. Topic: General - Other >> May 18, 2018  2:12 PM Luanna Coleawoud, Jessica L wrote: Reason for CRM: pt called and stated that Dulaglutide (TRULICITY) 0.75 MG/0.5ML SOPN [[id number]][[id number]] is now costing $150 and would like something more affordable to be called in. Please advise

## 2018-05-19 NOTE — Telephone Encounter (Signed)
Message sent to Dr. Greene 

## 2018-05-20 NOTE — Telephone Encounter (Signed)
Can we call his pharmacy and see if Bydureon, Victoza or Ozempic would be less?  Thanks.

## 2018-05-23 NOTE — Telephone Encounter (Signed)
Spoke with Pharmacy, the Bydureon is $197, Ozempic is $461 for the 1mg , 0.25mg  is $923, Victoza $367. The pt states that he will not be purchasing the Trulicity 0.75/0.435ml. He cannot afford it at all. Says he has the Good rx card and that did not help with the cost.

## 2018-05-25 NOTE — Telephone Encounter (Signed)
Unfortunately it looks like that class will be cost prohibitive.  He has an appointment with me in the next few weeks.  He can continue other meds for now, but have him check some home blood sugar readings every day or two with fasting and 2-hour postprandials and we can make some decisions at that visit on medication changes.  We do have some room for increase on his glimepiride if needed.  Let me know if there are questions in the meantime.

## 2018-05-26 NOTE — Telephone Encounter (Signed)
Spoke to patient because he stated diabetic medications too expensive to purchase. Dr Neva SeatGreene wants the patient to continue other medications for now and check some home blood sugar fasting and 2 hours after eating. He will make some decisions on medication changes at next visit. Dr Neva SeatGreene has some room for increase on Glimepiride if needed. Patient had no questions for Dr Neva SeatGreene.

## 2018-06-14 DIAGNOSIS — E113513 Type 2 diabetes mellitus with proliferative diabetic retinopathy with macular edema, bilateral: Secondary | ICD-10-CM | POA: Diagnosis not present

## 2018-06-14 DIAGNOSIS — H2513 Age-related nuclear cataract, bilateral: Secondary | ICD-10-CM | POA: Diagnosis not present

## 2018-06-16 ENCOUNTER — Ambulatory Visit (INDEPENDENT_AMBULATORY_CARE_PROVIDER_SITE_OTHER): Payer: Medicare HMO | Admitting: Family Medicine

## 2018-06-16 ENCOUNTER — Other Ambulatory Visit: Payer: Self-pay

## 2018-06-16 ENCOUNTER — Encounter: Payer: Self-pay | Admitting: Family Medicine

## 2018-06-16 VITALS — BP 132/70 | HR 74 | Temp 97.6°F | Ht 67.0 in | Wt 255.5 lb

## 2018-06-16 DIAGNOSIS — E785 Hyperlipidemia, unspecified: Secondary | ICD-10-CM | POA: Diagnosis not present

## 2018-06-16 DIAGNOSIS — I10 Essential (primary) hypertension: Secondary | ICD-10-CM | POA: Diagnosis not present

## 2018-06-16 DIAGNOSIS — E1122 Type 2 diabetes mellitus with diabetic chronic kidney disease: Secondary | ICD-10-CM | POA: Diagnosis not present

## 2018-06-16 LAB — LIPID PANEL
CHOL/HDL RATIO: 5 ratio (ref 0.0–5.0)
CHOLESTEROL TOTAL: 156 mg/dL (ref 100–199)
HDL: 31 mg/dL — ABNORMAL LOW (ref >39)
LDL Calculated: 79 mg/dL (ref 0–99)
TRIGLYCERIDES: 228 mg/dL — AB (ref 0–149)
VLDL Cholesterol Cal: 46 mg/dL — ABNORMAL HIGH (ref 5–40)

## 2018-06-16 LAB — COMPREHENSIVE METABOLIC PANEL
A/G RATIO: 1.6 (ref 1.2–2.2)
ALT: 22 IU/L (ref 0–44)
AST: 23 IU/L (ref 0–40)
Albumin: 4.7 g/dL (ref 3.5–4.8)
Alkaline Phosphatase: 49 IU/L (ref 39–117)
BUN/Creatinine Ratio: 22 (ref 10–24)
BUN: 38 mg/dL — ABNORMAL HIGH (ref 8–27)
Bilirubin Total: 0.7 mg/dL (ref 0.0–1.2)
CALCIUM: 9.8 mg/dL (ref 8.6–10.2)
CO2: 21 mmol/L (ref 20–29)
CREATININE: 1.73 mg/dL — AB (ref 0.76–1.27)
Chloride: 106 mmol/L (ref 96–106)
GFR calc non Af Amer: 39 mL/min/{1.73_m2} — ABNORMAL LOW (ref >59)
GFR, EST AFRICAN AMERICAN: 45 mL/min/{1.73_m2} — AB (ref >59)
GLOBULIN, TOTAL: 3 g/dL (ref 1.5–4.5)
Glucose: 163 mg/dL — ABNORMAL HIGH (ref 65–99)
POTASSIUM: 4.4 mmol/L (ref 3.5–5.2)
Sodium: 142 mmol/L (ref 134–144)
TOTAL PROTEIN: 7.7 g/dL (ref 6.0–8.5)

## 2018-06-16 LAB — HEMOGLOBIN A1C
ESTIMATED AVERAGE GLUCOSE: 160 mg/dL
Hgb A1c MFr Bld: 7.2 % — ABNORMAL HIGH (ref 4.8–5.6)

## 2018-06-16 NOTE — Progress Notes (Addendum)
Subjective:  By signing my name below, I, [name], attest that this documentation has been prepared under the direction and in the presence of [name], MD Electronically Signed: Ladene Artist, ED Scribe 06/16/2018 at 9:00 AM.   Patient ID: Timothy Martin, male    DOB: 12/07/1945, 72 y.o.   MRN: [id number]  Chief Complaint  Patient presents with  . Diabetes    f/u    HPI Timothy Martin is a 72 y.o. male who presents to Primary Care at [facility] for f/u. Pt is fasting at this visit.  DM Lab Results  Component Value Date   HGBA1C 7.2 (H) 03/15/2018   Wt Readings from Last 3 Encounters:  06/16/18 255 lb 8 oz (115.9 kg)  03/17/18 245 lb (111.1 kg)  03/15/18 247 lb 6.4 oz (112.2 kg)  Decided to continue same dose of meds last OV. DM is complicated by CKD. Referred to nephrology prev. Seen in July, Dr. Malachy Mood. Started Vit D 2000 units/day and Fe supplement bid. Creatinine 1.86. Optho: 02/22/18. Foot exam 03/15/18. UTD on pneumovax 2015. On ARB and statin. Amaryl 4 mg qd. Other meds have been cost prohibitive including Trulicity. Complicated by retinopathy and neuropathy for which he takes gabapentin. - Last dose of Trulicity was ~1 month ago. Home blood glucose readings from 110-140. Currently taking amaryl 4 mg with breakfast. Still has some tingling in his feet which he reports is tolerable. Denies any side-effects from meds. Pt is exercising ~4 days/wk.  Hyperlipidemia Lab Results  Component Value Date   CHOL 123 12/14/2017   HDL 25 (L) 12/14/2017   LDLCALC 51 12/14/2017   TRIG 235 (H) 12/14/2017   CHOLHDL 4.9 12/14/2017   Lab Results  Component Value Date   ALT 25 12/14/2017   AST 28 12/14/2017   ALKPHOS 52 12/14/2017   BILITOT 0.8 12/14/2017  Lipitor 10 mg qd. - Denies any myalgias or new side-effects.  HTN Lab Results  Component Value Date   CREATININE 1.75 (H) 03/15/2018   BP Readings from Last 3 Encounters:  06/16/18 132/70  03/17/18 (!) 146/74    03/15/18 (!) 144/76  Losartan-HCTZ, Toprol XL, Norvasc 5 mg qd which was increased last OV in May. - Reports average home BP readings of 120s-130s/70s. Denies any new side-effects from meds.  Patient Active Problem List   Diagnosis Date Noted  . Diabetic retinopathy ([place]) 03/03/2018  . Obesity (BMI 30-39.9) 08/03/2013  . Hypertension 03/29/2013  . DM (diabetes mellitus), type 2 with renal complications ([place]) 41/32/4401  . Elevated cholesterol 03/29/2013  . 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 Diabetes mellitus   . Heart murmur   . Hyperlipidemia   . Hypertension   . Kidney stone    Past Surgical History:  Procedure Laterality Date  . CYSTOSCOPY WITH RETROGRADE PYELOGRAM, URETEROSCOPY AND STENT PLACEMENT Right 06/18/2015   Procedure: CYSTOSCOPY WITH RIGHT RETROGRADE PYELOGRAM, RIGHT URETEROSCOPY ;  Surgeon: [name], MD;  Location: [facility];  Service: Urology;  Laterality: Right;  . TONSILLECTOMY     Allergies  Allergen Reactions  . Codeine Other (See Comments)    CHILDHOOD  Allergic to all forms of Codeine   Jittery cannot function  . Augmentin [Amoxicillin-Pot Clavulanate] Rash   Prior to Admission medications   Medication Sig Start Date End Date Taking? Authorizing Provider  Alcohol Swabs PADS USE TO CHECK BLOOD SUGAR DAILY. DX CODE: 250.00 02/26/16   [name], MD  amLODipine [facility])  5 MG tablet Take 1 tablet (5 mg total) by mouth daily. 03/15/18   [name], MD  atorvastatin (LIPITOR) 10 MG tablet Take 1 tablet (10 mg total) by mouth daily. 03/15/18   [name], MD  blood glucose meter kit and supplies KIT Dispense based on patient and insurance preference. Testing once per day. E11.29 06/07/17   [name], MD  Blood Glucose Monitoring Suppl (BLOOD GLUCOSE METER) kit Use to test blood sugar daily. Dx code: 250.00 05/18/13   [name], PA-C  Dulaglutide (TRULICITY) 6.38 VF/6.4PP SOPN Inject 0.75 mg  into the skin once a week. 03/15/18   [name], MD  fish oil-omega-3 fatty acids 1000 MG capsule Take 1 g by mouth 2 (two) times daily after a meal.    [provider]  gabapentin (NEURONTIN) 300 MG capsule Take 1 capsule (300 mg total) by mouth at bedtime. 03/15/18   [name], MD  glimepiride (AMARYL) 4 MG tablet Take 1 tablet (4 mg total) by mouth daily with breakfast. 04/04/18   [name], MD  glucose blood test strip Use as instructed one per day 06/03/17   [name], MD  Lancets MISC Use to test blood sugar daily. Dx code:250.00 03/04/17   [name], MD  losartan-hydrochlorothiazide (HYZAAR) 100-25 MG tablet Take 1 tablet by mouth daily. 03/15/18   [name], MD  metoprolol succinate (TOPROL-XL) 100 MG 24 hr tablet Take 1 tablet (100 mg total) by mouth daily. Take with or immediately following a meal. 03/15/18   [name], MD  Multiple Vitamins-Minerals (MULTIVITAMIN WITH MINERALS) tablet Take 1 tablet by mouth daily.    [provider]  tiZANidine (ZANAFLEX) 4 MG tablet Take 1 tablet (4 mg total) by mouth every 6 (six) hours as needed for muscle spasms. 03/17/18   Shawnee Knapp, MD  vitamin B-12 (CYANOCOBALAMIN) 100 MCG tablet Take 100 mcg by mouth every morning.     [provider]   Social History   Socioeconomic History  . Marital status: Married    Spouse name: Not on file  . Number of children: Not on file  . Years of education: Not on file  . Highest education level: Some college, no degree  Occupational History  . Not on file  Social Needs  . Financial resource strain: Not hard at all  . Food insecurity:    Worry: Never true    Inability: Never true  . Transportation needs:    Medical: No    Non-medical: No  Tobacco Use  . Smoking status: Never Smoker  . Smokeless tobacco: Never Used  Substance and Sexual Activity  . Alcohol use: No  . Drug use: No  . Sexual activity: Not on file  Lifestyle  .  Physical activity:    Days per week: 4 days    Minutes per session: 30 min  . Stress: Not at all  Relationships  . Social connections:    Talks on phone: More than three times a week    Gets together: More than three times a week    Attends religious service: Never    Active member of club or organization: Yes    Attends meetings of clubs or organizations: More than 4 times per year    Relationship status: Married  . Intimate partner violence:    Fear of current or ex partner: No    Emotionally abused: No    Physically  abused: No    Forced sexual activity: No  Other Topics Concern  . Not on file  Social History Narrative  . Not on file   Review of Systems  Constitutional: Negative for fatigue and unexpected weight change.  Eyes: Negative for visual disturbance.  Respiratory: Negative for cough, chest tightness and shortness of breath.   Cardiovascular: Negative for chest pain, palpitations and leg swelling.  Gastrointestinal: Negative for abdominal pain and blood in stool.  Musculoskeletal: Negative for myalgias.  Neurological: Positive for numbness (tingling in feet). Negative for dizziness, light-headedness and headaches.      Objective:   Physical Exam  Constitutional: He is oriented to person, place, and time. He appears well-developed and well-nourished.  HENT:  Head: Normocephalic and atraumatic.  Eyes: Pupils are equal, round, and reactive to light. EOM are normal.  Neck: No JVD present. Carotid bruit is not present.  Cardiovascular: Normal rate, regular rhythm and normal heart sounds.  No murmur heard. Pulmonary/Chest: Effort normal and breath sounds normal. He has no rales.  Musculoskeletal: He exhibits edema.  1+ pedal edema  Neurological: He is alert and oriented to person, place, and time.  Skin: Skin is warm and dry.  Psychiatric: He has a normal mood and affect.  Vitals reviewed.  Vitals:   06/16/18 0831 06/16/18 0844  BP: (!) 160/72 132/70  Pulse: 74    Temp: 97.6 F (36.4 C)   TempSrc: Oral   SpO2: 98%   Weight: 255 lb 8 oz (115.9 kg)   Height: '5\' 7"'$  (1.702 m)       Assessment & Plan:   Timothy Martin is a 72 y.o. male Type 2 diabetes mellitus with chronic kidney disease, without long-term current use of insulin, unspecified CKD stage ([place]) - Plan: Hemoglobin A1c  - check A1c, then change dose of amaryl if needed. Labs pending to decide prior to refills.    - continue follow up with nephrology and optho for CKD and retinopathy.   -tolerating gabapentin for nephropathy, option of dose changes discussed.   Essential hypertension - Plan: Comprehensive metabolic panel  - stable on recheck.   Hyperlipidemia, unspecified hyperlipidemia type - Plan: Lipid panel  -  Stable, tolerating current regimen. Labs pending as above. Refills depending on lab results.   Peripheral edema  - handout given. Discussed salt avoidance and leg elevation, but may be due to higher dose amlodipine - if persistent, can adjust regimen.   No orders of the defined types were placed in this encounter.  Patient Instructions   Depending on A1c results, we can adjust glimepiride. I wil let you know once I see the results.   No other med changes for now.   Walking most days per week to help with weight and blood sugar.   Try to decrease salt in diet and elevate legs when seated.  See other info on peripheral edema below. If that is not improving, we may need to change amlodipine dose.   Thanks for coming in today.    Peripheral Edema Peripheral edema is swelling that is caused by a buildup of fluid. Peripheral edema most often affects the lower legs, ankles, and feet. It can also develop in the arms, hands, and face. The area of the body that has peripheral edema will look swollen. It may also feel heavy or warm. Your clothes may start to feel tight. Pressing on the area may make a temporary dent in your skin. You may not be able to move your  arm or leg as  much as usual. There are many causes of peripheral edema. It can be a complication of other diseases, such as congestive heart failure, kidney disease, or a problem with your blood circulation. It also can be a side effect of certain medicines. It often happens to women during pregnancy. Sometimes, the cause is not known. Treating the underlying condition is often the only treatment for peripheral edema. Follow these instructions at home: Pay attention to any changes in your symptoms. Take these actions to help with your discomfort:  Raise (elevate) your legs while you are sitting or lying down.  Move around often to prevent stiffness and to lessen swelling. Do not sit or stand for long periods of time.  Wear support stockings as told by your health care provider.  Follow instructions from your health care provider about limiting salt (sodium) in your diet. Sometimes eating less salt can reduce swelling.  Take over-the-counter and prescription medicines only as told by your health care provider. Your health care provider may prescribe medicine to help your body get rid of excess water (diuretic).  Keep all follow-up visits as told by your health care provider. This is important.  Contact a health care provider if:  You have a fever.  Your edema starts suddenly or is getting worse, especially if you are pregnant or have a medical condition.  You have swelling in only one leg.  You have increased swelling and pain in your legs. Get help right away if:  You develop shortness of breath, especially when you are lying down.  You have pain in your chest or abdomen.  You feel weak.  You faint. This information is not intended to replace advice given to you by your health care provider. Make sure you discuss any questions you have with your health care provider. Document Released: 11/26/2004 Document Revised: 03/23/2016 Document Reviewed: 05/01/2015 Elsevier Interactive Patient Education   2018 [employer].   IF you received an x-ray today, you will receive an invoice from [facility]. Please contact [facility] at [phone] with questions or concerns regarding your invoice.   IF you received labwork today, you will receive an invoice from [place]. Please contact [facility] at [phone] with questions or concerns regarding your invoice.   Our billing staff will not be able to assist you with questions regarding bills from these companies.  You will be contacted with the lab results as soon as they are available. The fastest way to get your results is to activate your My Chart account. Instructions are located on the last page of this paperwork. If you have not heard from [name] regarding the results in 2 weeks, please contact this office.       I personally performed the services described in this documentation, which was scribed in my presence. The recorded information has been reviewed and considered for accuracy and completeness, addended by me as needed, and agree with information above.  Signed,   [name], MD [facility] at [facility].  06/16/18 10:12 AM

## 2018-06-16 NOTE — Patient Instructions (Addendum)
Depending on A1c results, we can adjust glimepiride. I wil let you know once I see the results.   No other med changes for now.   Walking most days per week to help with weight and blood sugar.   Try to decrease salt in diet and elevate legs when seated.  See other info on peripheral edema below. If that is not improving, we may need to change amlodipine dose.   Thanks for coming in today.    Peripheral Edema Peripheral edema is swelling that is caused by a buildup of fluid. Peripheral edema most often affects the lower legs, ankles, and feet. It can also develop in the arms, hands, and face. The area of the body that has peripheral edema will look swollen. It may also feel heavy or warm. Your clothes may start to feel tight. Pressing on the area may make a temporary dent in your skin. You may not be able to move your arm or leg as much as usual. There are many causes of peripheral edema. It can be a complication of other diseases, such as congestive heart failure, kidney disease, or a problem with your blood circulation. It also can be a side effect of certain medicines. It often happens to women during pregnancy. Sometimes, the cause is not known. Treating the underlying condition is often the only treatment for peripheral edema. Follow these instructions at home: Pay attention to any changes in your symptoms. Take these actions to help with your discomfort:  Raise (elevate) your legs while you are sitting or lying down.  Move around often to prevent stiffness and to lessen swelling. Do not sit or stand for long periods of time.  Wear support stockings as told by your health care provider.  Follow instructions from your health care provider about limiting salt (sodium) in your diet. Sometimes eating less salt can reduce swelling.  Take over-the-counter and prescription medicines only as told by your health care provider. Your health care provider may prescribe medicine to help your body  get rid of excess water (diuretic).  Keep all follow-up visits as told by your health care provider. This is important.  Contact a health care provider if:  You have a fever.  Your edema starts suddenly or is getting worse, especially if you are pregnant or have a medical condition.  You have swelling in only one leg.  You have increased swelling and pain in your legs. Get help right away if:  You develop shortness of breath, especially when you are lying down.  You have pain in your chest or abdomen.  You feel weak.  You faint. This information is not intended to replace advice given to you by your health care provider. Make sure you discuss any questions you have with your health care provider. Document Released: 11/26/2004 Document Revised: 03/23/2016 Document Reviewed: 05/01/2015 Elsevier Interactive Patient Education  2018 [employer]   IF you received an x-ray today, you will receive an invoice from [facility]. Please contact [facility] at [phone] with questions or concerns regarding your invoice.   IF you received labwork today, you will receive an invoice from [facility]. Please contact [facility] at [phone] with questions or concerns regarding your invoice.   Our billing staff will not be able to assist you with questions regarding bills from these companies.  You will be contacted with the lab results as soon as they are available. The fastest way to get your results is to activate your My Chart account.  Instructions are located on the last page of this paperwork. If you have not heard from [name] regarding the results in 2 weeks, please contact this office.

## 2018-07-06 ENCOUNTER — Encounter: Payer: Self-pay | Admitting: *Deleted

## 2018-07-08 ENCOUNTER — Telehealth: Payer: Self-pay | Admitting: Family Medicine

## 2018-07-08 NOTE — Telephone Encounter (Signed)
Pt would like a refill on his glimepiride (AMARYL) 4 MG tablet [[id number]] and gabapentin (NEURONTIN) 300 MG capsule [[id number]]. Pt would like these scripts sent to [facility] on [address]. Pt states he spoke with Dr. Neva Seat about this last month. [phone]

## 2018-07-15 ENCOUNTER — Telehealth: Payer: Self-pay

## 2018-07-15 ENCOUNTER — Other Ambulatory Visit: Payer: Self-pay | Admitting: Family Medicine

## 2018-07-15 ENCOUNTER — Other Ambulatory Visit: Payer: Self-pay

## 2018-07-15 DIAGNOSIS — E1142 Type 2 diabetes mellitus with diabetic polyneuropathy: Secondary | ICD-10-CM

## 2018-07-15 DIAGNOSIS — E1121 Type 2 diabetes mellitus with diabetic nephropathy: Secondary | ICD-10-CM

## 2018-07-15 NOTE — Progress Notes (Unsigned)
Pt is asking that you refill his medication

## 2018-07-15 NOTE — Telephone Encounter (Signed)
Pt is requesting that you refill his medications

## 2018-07-17 MED ORDER — GABAPENTIN 300 MG PO CAPS: 300.0000 mg | ORAL_CAPSULE | Freq: Every day | ORAL | 1 refills | Status: DC

## 2018-07-17 NOTE — Telephone Encounter (Signed)
OV in august. meds refilled.

## 2018-07-26 DIAGNOSIS — H2513 Age-related nuclear cataract, bilateral: Secondary | ICD-10-CM | POA: Diagnosis not present

## 2018-07-26 DIAGNOSIS — E113513 Type 2 diabetes mellitus with proliferative diabetic retinopathy with macular edema, bilateral: Secondary | ICD-10-CM | POA: Diagnosis not present

## 2018-08-01 DIAGNOSIS — I129 Hypertensive chronic kidney disease with stage 1 through stage 4 chronic kidney disease, or unspecified chronic kidney disease: Secondary | ICD-10-CM | POA: Diagnosis not present

## 2018-08-01 DIAGNOSIS — N183 Chronic kidney disease, stage 3 (moderate): Secondary | ICD-10-CM | POA: Diagnosis not present

## 2018-08-01 DIAGNOSIS — N281 Cyst of kidney, acquired: Secondary | ICD-10-CM | POA: Diagnosis not present

## 2018-09-06 DIAGNOSIS — H3582 Retinal ischemia: Secondary | ICD-10-CM | POA: Diagnosis not present

## 2018-09-06 DIAGNOSIS — E113513 Type 2 diabetes mellitus with proliferative diabetic retinopathy with macular edema, bilateral: Secondary | ICD-10-CM | POA: Diagnosis not present

## 2018-09-06 DIAGNOSIS — H2513 Age-related nuclear cataract, bilateral: Secondary | ICD-10-CM | POA: Diagnosis not present

## 2018-09-20 ENCOUNTER — Other Ambulatory Visit: Payer: Self-pay

## 2018-09-20 ENCOUNTER — Ambulatory Visit (INDEPENDENT_AMBULATORY_CARE_PROVIDER_SITE_OTHER): Payer: Medicare HMO | Admitting: Family Medicine

## 2018-09-20 ENCOUNTER — Encounter: Payer: Self-pay | Admitting: Family Medicine

## 2018-09-20 VITALS — BP 146/68 | HR 74 | Temp 97.6°F | Ht 67.0 in | Wt 254.6 lb

## 2018-09-20 DIAGNOSIS — I10 Essential (primary) hypertension: Secondary | ICD-10-CM | POA: Diagnosis not present

## 2018-09-20 DIAGNOSIS — E1122 Type 2 diabetes mellitus with diabetic chronic kidney disease: Secondary | ICD-10-CM

## 2018-09-20 DIAGNOSIS — E1142 Type 2 diabetes mellitus with diabetic polyneuropathy: Secondary | ICD-10-CM

## 2018-09-20 DIAGNOSIS — Z23 Encounter for immunization: Secondary | ICD-10-CM

## 2018-09-20 DIAGNOSIS — E785 Hyperlipidemia, unspecified: Secondary | ICD-10-CM | POA: Diagnosis not present

## 2018-09-20 MED ORDER — ZOSTER VAC RECOMB ADJUVANTED 50 MCG/0.5ML IM SUSR: 0.5000 mL | Freq: Once | INTRAMUSCULAR | 1 refills | Status: AC

## 2018-09-20 MED ORDER — LOSARTAN POTASSIUM-HCTZ 100-25 MG PO TABS: 1.0000 | ORAL_TABLET | Freq: Every day | ORAL | 1 refills | Status: DC

## 2018-09-20 MED ORDER — ATORVASTATIN CALCIUM 10 MG PO TABS: 10.0000 mg | ORAL_TABLET | Freq: Every day | ORAL | 1 refills | Status: DC

## 2018-09-20 NOTE — Progress Notes (Signed)
Subjective:  By signing my name below, I, Timothy Martin, attest that this documentation has been prepared under the direction and in the presence of Timothy Ray, MD. Electronically Signed: Moises Martin, [place]. 09/20/2018 , 8:25 AM .  Patient was seen in Room 10 .   Patient ID: Timothy Martin, male    DOB: 10/18/1946, 72 y.o.   MRN: [id number] Chief Complaint  Patient presents with  . Diabetes    3 m f/u    HPI Timothy Martin is a 72 y.o. male Here for follow up.   Health maintenance He also received flu shot today.  Shingles vaccination sent in today.   Information provided about living will today.   Diabetes He has a history of diabetes, complicated by CKD and diabetic neuropathy. Decided to remain on same regime with diet and exercise. He's also on a statin, Lipitor 10 mg qd and ARB.   He takes glimepiride 4 mg qd. He also takes gabapentin 300 mg qhs for diabetic neuropathy. He's followed by nephrology, Dr. Malachy Mood. He's still exercising about 4 days a week. He's been drinking unsweetened tea.   Lab Results  Component Value Date   HGBA1C 7.2 (H) 06/16/2018   Wt Readings from Last 3 Encounters:  09/20/18 254 lb 9.6 oz (115.5 kg)  06/16/18 255 lb 8 oz (115.9 kg)  03/17/18 245 lb (111.1 kg)   Optho: 02/22/18 Foot: 03/15/18 Pneumovax: 07/19/14 Urine micro albumin: elevated at 52 at 12/14/17.   Lab Results  Component Value Date   CHOL 156 06/16/2018   HDL 31 (L) 06/16/2018   LDLCALC 79 06/16/2018   TRIG 228 (H) 06/16/2018   CHOLHDL 5.0 06/16/2018    HTN BP Readings from Last 3 Encounters:  09/20/18 (!) 170/83  06/16/18 132/70  03/17/18 (!) 146/74   Lab Results  Component Value Date   CREATININE 1.73 (H) 06/16/2018   He takes Norvasc 5 mg, losartan-hctz 100-25 mg, and toprol xl 100 mg qd. He had some peripheral edema at last visit, so Norvasc was increased from 2.5 mg to 5 mg in May.   His nephrologist, Dr. Malachy Mood, had placed him on hydralazine 25  mg tid. Last seen on Sept 30th, amlodipine was discontinued due to swelling, so started on hydralazine 25 mg tid. He checks his BP at home, with highest up to 145, but usually around 130-140s. His next appointment with nephrologist is in 1 month. He denies missing any doses of his medications. He denies any chest pain, shortness of breath, lightheadedness, dizziness, headaches, Martin in stool or dark tarry stool.   Patient Active Problem List   Diagnosis Date Noted  . Diabetic retinopathy ([place]) 03/03/2018  . Obesity (BMI 30-39.9) 08/03/2013  . Hypertension 03/29/2013  . DM (diabetes mellitus), type 2 with renal complications ([place]) 63/14/9702  . Elevated cholesterol 03/29/2013  . 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 Diabetes mellitus   . Heart murmur   . Hyperlipidemia   . Hypertension   . Kidney stone    Past Surgical History:  Procedure Laterality Date  . CYSTOSCOPY WITH RETROGRADE PYELOGRAM, URETEROSCOPY AND STENT PLACEMENT Right 06/18/2015   Procedure: CYSTOSCOPY WITH RIGHT RETROGRADE PYELOGRAM, RIGHT URETEROSCOPY ;  Surgeon: [name], MD;  Location: [facility];  Service: Urology;  Laterality: Right;  . TONSILLECTOMY     Allergies  Allergen Reactions  . Codeine Other (See Comments)    CHILDHOOD  Allergic to all forms of Codeine   Jittery cannot  function  . Augmentin [Amoxicillin-Pot Clavulanate] Rash   Prior to Admission medications   Medication Sig Start Date End Date Taking? Authorizing Provider  Alcohol Swabs PADS USE TO CHECK Martin SUGAR DAILY. DX CODE: 250.00 02/26/16  Yes [name], MD  amLODipine (NORVASC) 5 MG tablet Take 1 tablet (5 mg total) by mouth daily. 03/15/18  Yes [name], MD  atorvastatin (LIPITOR) 10 MG tablet Take 1 tablet (10 mg total) by mouth daily. 03/15/18  Yes [name], MD  Martin Glucose Monitoring Suppl (Martin GLUCOSE METER) kit Use to test Martin sugar daily. Dx code: 250.00 05/18/13  Yes  [name], [name], PA-C  cholecalciferol (VITAMIN D) 1000 units tablet Take 2,000 Units by mouth daily.   Yes [provider]  fish oil-omega-3 fatty acids 1000 MG capsule Take 1 g by mouth 2 (two) times daily after a meal.   Yes [provider]  gabapentin (NEURONTIN) 300 MG capsule Take 1 capsule (300 mg total) by mouth at bedtime. 07/17/18  Yes [name], MD  glimepiride (AMARYL) 4 MG tablet TAKE 1 TABLET BY MOUTH ONCE DAILY WITH BREAKFAST 07/17/18  Yes [name], MD  glucose Martin test strip Use as instructed one per day 06/03/17  Yes [name], MD  hydrALAZINE (APRESOLINE) 25 MG tablet Take 25 mg by mouth 3 (three) times daily.   Yes [provider]  Lancets MISC Use to test Martin sugar daily. Dx code:250.00 03/04/17  Yes [name], MD  losartan-hydrochlorothiazide (HYZAAR) 100-25 MG tablet Take 1 tablet by mouth daily. 03/15/18  Yes [name], MD  metoprolol succinate (TOPROL-XL) 100 MG 24 hr tablet Take 1 tablet (100 mg total) by mouth daily. Take with or immediately following a meal. 03/15/18  Yes [name], MD  Multiple Vitamins-Minerals (MULTIVITAMIN WITH MINERALS) tablet Take 1 tablet by mouth daily.   Yes [provider]  tiZANidine (ZANAFLEX) 4 MG tablet Take 1 tablet (4 mg total) by mouth every 6 (six) hours as needed for muscle spasms. 03/17/18  Yes Shawnee Knapp, MD  vitamin B-12 (CYANOCOBALAMIN) 100 MCG tablet Take 100 mcg by mouth every morning.    Yes [provider]   Social History   Socioeconomic History  . Marital status: Married    Spouse name: Not on file  . Number of children: Not on file  . Years of education: Not on file  . Highest education level: Some college, no degree  Occupational History  . Not on file  Social Needs  . Financial resource strain: Not hard at all  . Food insecurity:    Worry: Never true    Inability: Never true  . Transportation needs:    Medical: No     Non-medical: No  Tobacco Use  . Smoking status: Never Smoker  . Smokeless tobacco: Never Used  Substance and Sexual Activity  . Alcohol use: No  . Drug use: No  . Sexual activity: Not on file  Lifestyle  . Physical activity:    Days per week: 4 days    Minutes per session: 30 min  . Stress: Not at all  Relationships  . Social connections:    Talks on phone: More than three times a week    Gets together: More than three times a week    Attends religious service: Never    Active member of club or organization: Yes    Attends meetings of clubs or organizations: More  than 4 times per year    Relationship status: Married  . Intimate partner violence:    Fear of current or ex partner: No    Emotionally abused: No    Physically abused: No    Forced sexual activity: No  Other Topics Concern  . Not on file  Social History Narrative  . Not on file   Review of Systems  Constitutional: Negative for fatigue and unexpected weight change.  Eyes: Negative for visual disturbance.  Respiratory: Negative for cough, chest tightness and shortness of breath.   Cardiovascular: Negative for chest pain, palpitations and leg swelling.  Gastrointestinal: Negative for abdominal pain and Martin in stool.  Neurological: Negative for dizziness, light-headedness and headaches.       Objective:   Physical Exam  Constitutional: He is oriented to person, place, and time. He appears well-developed and well-nourished.  HENT:  Head: Normocephalic and atraumatic.  Eyes: Pupils are equal, round, and reactive to light. EOM are normal.  Neck: No JVD present. Carotid bruit is not present.  Cardiovascular: Normal rate, regular rhythm and normal heart sounds.  No murmur heard. Pulmonary/Chest: Effort normal and breath sounds normal. He has no rales.  Musculoskeletal: He exhibits edema (Tace to 1+ pedal edema to middle 3rd).  Neurological: He is alert and oriented to person, place, and time.  Skin: Skin is  warm and dry.  Psychiatric: He has a normal mood and affect.  Vitals reviewed.   Vitals:   09/20/18 0800 09/20/18 0801  BP: (!) 175/69 (!) 170/83  Pulse: 74   Temp: 97.6 F (36.4 C)   TempSrc: Oral   SpO2: 97%   Weight: 254 lb 9.6 oz (115.5 kg)   Height: _0  (1.702 m)        Assessment & Plan:   Syris Brookens is a 72 y.o. male Type 2 diabetes mellitus with chronic kidney disease, without long-term current use of insulin, unspecified CKD stage ([place]) - Plan: Hemoglobin A1c  -Borderline controlled previously.  Tolerating current regimen, continue same, check labs.  Need for influenza vaccination - Plan: Flu vaccine HIGH DOSE PF (Fluzone High dose)  Essential hypertension - Plan: losartan-hydrochlorothiazide (HYZAAR) 100-25 MG tablet  -Improved with repeat testing, still slightly elevated.  On review of nephrology note, plan for home Martin pressure evaluation to determine changes.  Advised patient to send readings to nephrology to decide on changes.  Hyperlipidemia, unspecified hyperlipidemia type - Plan: atorvastatin (LIPITOR) 10 MG tablet  -Tolerating Lipitor, refilled.  Labs last visit overall stable  Need for shingles vaccine - Plan: Zoster Vaccine Adjuvanted [facility]) injection sent to pharmacy  Diabetic polyneuropathy associated with type 2 diabetes mellitus ([place])  -Stable with gabapentin.  Continue same.  Recheck 3 months   Meds ordered this encounter  Medications  . losartan-hydrochlorothiazide (HYZAAR) 100-25 MG tablet    Sig: Take 1 tablet by mouth daily.    Dispense:  90 tablet    Refill:  1  . atorvastatin (LIPITOR) 10 MG tablet    Sig: Take 1 tablet (10 mg total) by mouth daily.    Dispense:  90 tablet    Refill:  1  . Zoster Vaccine Adjuvanted [facility]) injection    Sig: Inject 0.5 mLs into the muscle once for 1 dose. Repeat in 2-6 months.    Dispense:  0.5 mL    Refill:  1   Patient Instructions    Send a copy of your home BP's to nephrology  to decide on any med  changes.   I sent in shingles vaccine to your pharmacy.  No other changes for now, follow-up in 3 months.     If you have lab work done today you will be contacted with your lab results within the next 2 weeks.  If you have not heard from [name] then please contact us. The fastest way to get your results is to register for My Chart.   IF you received an x-Martin today, you will receive an invoice from [facility]. Please contact [facility] at [phone] with questions or concerns regarding your invoice.   IF you received labwork today, you will receive an invoice from [place]. Please contact [facility] at [phone] with questions or concerns regarding your invoice.   Our billing staff will not be able to assist you with questions regarding bills from these companies.  You will be contacted with the lab results as soon as they are available. The fastest way to get your results is to activate your My Chart account. Instructions are located on the last page of this paperwork. If you have not heard from [name] regarding the results in 2 weeks, please contact this office.       I personally performed the services described in this documentation, which was scribed in my presence. The recorded information has been reviewed and considered for accuracy and completeness, addended by me as needed, and agree with information above.  Signed,   Timothy Ray, MD [facility] [place].  09/20/18 8:40 AM

## 2018-09-20 NOTE — Patient Instructions (Addendum)
  Send a copy of your home BP's to nephrology to decide on any med changes.   I sent in shingles vaccine to your pharmacy.  No other changes for now, follow-up in 3 months.     If you have lab work done today you will be contacted with your lab results within the next 2 weeks.  If you have not heard from us then please contact us. The fastest way to get your results is to register for My Chart.   IF you received an x-ray today, you will receive an invoice from [facility]. Please contact [facility] at [phone] with questions or concerns regarding your invoice.   IF you received labwork today, you will receive an invoice from [facility]. Please contact [facility] at [phone] with questions or concerns regarding your invoice.   Our billing staff will not be able to assist you with questions regarding bills from these companies.  You will be contacted with the lab results as soon as they are available. The fastest way to get your results is to activate your My Chart account. Instructions are located on the last page of this paperwork. If you have not heard from us regarding the results in 2 weeks, please contact this office.

## 2018-09-21 LAB — HEMOGLOBIN A1C
Est. average glucose Bld gHb Est-mCnc: 183 mg/dL
Hgb A1c MFr Bld: 8 % — ABNORMAL HIGH (ref 4.8–5.6)

## 2018-09-27 DIAGNOSIS — H25043 Posterior subcapsular polar age-related cataract, bilateral: Secondary | ICD-10-CM | POA: Diagnosis not present

## 2018-09-27 DIAGNOSIS — H2512 Age-related nuclear cataract, left eye: Secondary | ICD-10-CM | POA: Diagnosis not present

## 2018-09-27 DIAGNOSIS — H25013 Cortical age-related cataract, bilateral: Secondary | ICD-10-CM | POA: Diagnosis not present

## 2018-09-27 DIAGNOSIS — H18413 Arcus senilis, bilateral: Secondary | ICD-10-CM | POA: Diagnosis not present

## 2018-09-27 DIAGNOSIS — H2513 Age-related nuclear cataract, bilateral: Secondary | ICD-10-CM | POA: Diagnosis not present

## 2018-09-27 DIAGNOSIS — E113393 Type 2 diabetes mellitus with moderate nonproliferative diabetic retinopathy without macular edema, bilateral: Secondary | ICD-10-CM | POA: Diagnosis not present

## 2018-10-03 ENCOUNTER — Telehealth: Payer: Self-pay | Admitting: Family Medicine

## 2018-10-03 MED ORDER — LOSARTAN POTASSIUM 100 MG PO TABS: 100.0000 mg | ORAL_TABLET | Freq: Every day | ORAL | 0 refills | Status: DC

## 2018-10-03 MED ORDER — HYDROCHLOROTHIAZIDE 25 MG PO TABS: 25.0000 mg | ORAL_TABLET | Freq: Every day | ORAL | 0 refills | Status: DC

## 2018-10-03 NOTE — Telephone Encounter (Signed)
Individual components sent.

## 2018-10-03 NOTE — Telephone Encounter (Signed)
Message re: Hyzaar sent to Dr. Neva SeatGreene

## 2018-10-03 NOTE — Telephone Encounter (Signed)
Pt came in requesting a change in his Hyzaar combo med.  The pharmacy currently does not have this in stock and is needing the individual losartan and hydrochlorothiazide tablets approved.  [facility] still uses the [facility] on [address].  Please advise thank you!

## 2018-10-04 ENCOUNTER — Encounter: Payer: Self-pay | Admitting: *Deleted

## 2018-10-17 DIAGNOSIS — N281 Cyst of kidney, acquired: Secondary | ICD-10-CM | POA: Diagnosis not present

## 2018-10-17 DIAGNOSIS — E559 Vitamin D deficiency, unspecified: Secondary | ICD-10-CM | POA: Diagnosis not present

## 2018-10-17 DIAGNOSIS — N183 Chronic kidney disease, stage 3 (moderate): Secondary | ICD-10-CM | POA: Diagnosis not present

## 2018-10-17 DIAGNOSIS — Z87442 Personal history of urinary calculi: Secondary | ICD-10-CM | POA: Diagnosis not present

## 2018-10-17 DIAGNOSIS — I129 Hypertensive chronic kidney disease with stage 1 through stage 4 chronic kidney disease, or unspecified chronic kidney disease: Secondary | ICD-10-CM | POA: Diagnosis not present

## 2018-10-18 DIAGNOSIS — E113513 Type 2 diabetes mellitus with proliferative diabetic retinopathy with macular edema, bilateral: Secondary | ICD-10-CM | POA: Diagnosis not present

## 2018-10-18 DIAGNOSIS — H2513 Age-related nuclear cataract, bilateral: Secondary | ICD-10-CM | POA: Diagnosis not present

## 2018-11-07 DIAGNOSIS — H2512 Age-related nuclear cataract, left eye: Secondary | ICD-10-CM | POA: Diagnosis not present

## 2018-11-07 DIAGNOSIS — H5202 Hypermetropia, left eye: Secondary | ICD-10-CM | POA: Diagnosis not present

## 2018-11-07 DIAGNOSIS — H2511 Age-related nuclear cataract, right eye: Secondary | ICD-10-CM | POA: Diagnosis not present

## 2018-11-07 DIAGNOSIS — Z961 Presence of intraocular lens: Secondary | ICD-10-CM | POA: Diagnosis not present

## 2018-11-07 DIAGNOSIS — H52222 Regular astigmatism, left eye: Secondary | ICD-10-CM | POA: Diagnosis not present

## 2018-11-07 HISTORY — PX: EYE SURGERY: SHX253

## 2018-11-08 DIAGNOSIS — H2511 Age-related nuclear cataract, right eye: Secondary | ICD-10-CM | POA: Diagnosis not present

## 2018-11-29 DIAGNOSIS — E113513 Type 2 diabetes mellitus with proliferative diabetic retinopathy with macular edema, bilateral: Secondary | ICD-10-CM | POA: Diagnosis not present

## 2018-11-29 DIAGNOSIS — H2511 Age-related nuclear cataract, right eye: Secondary | ICD-10-CM | POA: Diagnosis not present

## 2018-12-12 DIAGNOSIS — H52222 Regular astigmatism, left eye: Secondary | ICD-10-CM | POA: Diagnosis not present

## 2018-12-12 DIAGNOSIS — Z961 Presence of intraocular lens: Secondary | ICD-10-CM | POA: Diagnosis not present

## 2018-12-12 DIAGNOSIS — H5203 Hypermetropia, bilateral: Secondary | ICD-10-CM | POA: Diagnosis not present

## 2018-12-12 DIAGNOSIS — H524 Presbyopia: Secondary | ICD-10-CM | POA: Diagnosis not present

## 2018-12-12 DIAGNOSIS — H2511 Age-related nuclear cataract, right eye: Secondary | ICD-10-CM | POA: Diagnosis not present

## 2018-12-19 ENCOUNTER — Encounter: Payer: Self-pay | Admitting: Family Medicine

## 2018-12-19 ENCOUNTER — Other Ambulatory Visit: Payer: Self-pay

## 2018-12-19 ENCOUNTER — Ambulatory Visit (INDEPENDENT_AMBULATORY_CARE_PROVIDER_SITE_OTHER): Payer: Medicare HMO | Admitting: Family Medicine

## 2018-12-19 VITALS — BP 146/64 | HR 104 | Temp 98.0°F | Resp 14 | Ht 67.0 in | Wt 251.0 lb

## 2018-12-19 DIAGNOSIS — R011 Cardiac murmur, unspecified: Secondary | ICD-10-CM | POA: Diagnosis not present

## 2018-12-19 DIAGNOSIS — E1121 Type 2 diabetes mellitus with diabetic nephropathy: Secondary | ICD-10-CM

## 2018-12-19 DIAGNOSIS — I10 Essential (primary) hypertension: Secondary | ICD-10-CM

## 2018-12-19 DIAGNOSIS — E1142 Type 2 diabetes mellitus with diabetic polyneuropathy: Secondary | ICD-10-CM

## 2018-12-19 DIAGNOSIS — E1122 Type 2 diabetes mellitus with diabetic chronic kidney disease: Secondary | ICD-10-CM

## 2018-12-19 DIAGNOSIS — E785 Hyperlipidemia, unspecified: Secondary | ICD-10-CM

## 2018-12-19 LAB — POCT GLYCOSYLATED HEMOGLOBIN (HGB A1C): HEMOGLOBIN A1C: 7.8 % — AB (ref 4.0–5.6)

## 2018-12-19 LAB — GLUCOSE, POCT (MANUAL RESULT ENTRY): POC GLUCOSE: 275 mg/dL — AB (ref 70–99)

## 2018-12-19 MED ORDER — HYDROCHLOROTHIAZIDE 25 MG PO TABS: 25.0000 mg | ORAL_TABLET | Freq: Every day | ORAL | 1 refills | Status: AC

## 2018-12-19 MED ORDER — HYDRALAZINE HCL 25 MG PO TABS: 25.0000 mg | ORAL_TABLET | Freq: Three times a day (TID) | ORAL | 1 refills | Status: AC

## 2018-12-19 MED ORDER — LOSARTAN POTASSIUM 100 MG PO TABS: 100.0000 mg | ORAL_TABLET | Freq: Every day | ORAL | 1 refills | Status: AC

## 2018-12-19 MED ORDER — METOPROLOL SUCCINATE ER 100 MG PO TB24: 100.0000 mg | ORAL_TABLET | Freq: Every day | ORAL | 1 refills | Status: AC

## 2018-12-19 MED ORDER — GABAPENTIN 300 MG PO CAPS: 300.0000 mg | ORAL_CAPSULE | Freq: Every day | ORAL | 1 refills | Status: DC

## 2018-12-19 MED ORDER — ATORVASTATIN CALCIUM 10 MG PO TABS: 10.0000 mg | ORAL_TABLET | Freq: Every day | ORAL | 1 refills | Status: AC

## 2018-12-19 MED ORDER — GLIMEPIRIDE 4 MG PO TABS: ORAL_TABLET | ORAL | 1 refills | Status: AC

## 2018-12-19 NOTE — Patient Instructions (Addendum)
I will schedule an ultrasound to evaluate the heart murmur but it appears that has been there previously as noted on some exams past few years.  Continue to monitor home blood sugar readings and if those readings remain over 200 return to discuss other med options.  If you can continue to work on diet with weight loss that should continue to help with diabetes numbers but do recommend rechecking that level in 3 months.  See information below on diabetes and food. Increased activity/exercise should help as well.    Diabetes Mellitus and Nutrition, Adult When you have diabetes (diabetes mellitus), it is very important to have healthy eating habits because your blood sugar (glucose) levels are greatly affected by what you eat and drink. Eating healthy foods in the appropriate amounts, at about the same times every day, can help you:  Control your blood glucose.  Lower your risk of heart disease.  Improve your blood pressure.  Reach or maintain a healthy weight. Every person with diabetes is different, and each person has different needs for a meal plan. Your health care provider may recommend that you work with a diet and nutrition specialist (dietitian) to make a meal plan that is best for you. Your meal plan may vary depending on factors such as:  The calories you need.  The medicines you take.  Your weight.  Your blood glucose, blood pressure, and cholesterol levels.  Your activity level.  Other health conditions you have, such as heart or kidney disease. How do carbohydrates affect me? Carbohydrates, also called carbs, affect your blood glucose level more than any other type of food. Eating carbs naturally raises the amount of glucose in your blood. Carb counting is a method for keeping track of how many carbs you eat. Counting carbs is important to keep your blood glucose at a healthy level, especially if you use insulin or take certain oral diabetes medicines. It is important to  know how many carbs you can safely have in each meal. This is different for every person. Your dietitian can help you calculate how many carbs you should have at each meal and for each snack. Foods that contain carbs include:  Bread, cereal, rice, pasta, and crackers.  Potatoes and corn.  Peas, beans, and lentils.  Milk and yogurt.  Fruit and juice.  Desserts, such as cakes, cookies, ice cream, and candy. How does alcohol affect me? Alcohol can cause a sudden decrease in blood glucose (hypoglycemia), especially if you use insulin or take certain oral diabetes medicines. Hypoglycemia can be a life-threatening condition. Symptoms of hypoglycemia (sleepiness, dizziness, and confusion) are similar to symptoms of having too much alcohol. If your health care provider says that alcohol is safe for you, follow these guidelines:  Limit alcohol intake to no more than 1 drink per day for nonpregnant women and 2 drinks per day for men. One drink equals 12 oz of beer, 5 oz of wine, or 1 oz of hard liquor.  Do not drink on an empty stomach.  Keep yourself hydrated with water, diet soda, or unsweetened iced tea.  Keep in mind that regular soda, juice, and other mixers may contain a lot of sugar and must be counted as carbs. What are tips for following this plan?  Reading food labels  Start by checking the serving size on the "Nutrition Facts" label of packaged foods and drinks. The amount of calories, carbs, fats, and other nutrients listed on the label is based on one serving  of the item. Many items contain more than one serving per package.  Check the total grams (g) of carbs in one serving. You can calculate the number of servings of carbs in one serving by dividing the total carbs by 15. For example, if a food has 30 g of total carbs, it would be equal to 2 servings of carbs.  Check the number of grams (g) of saturated and trans fats in one serving. Choose foods that have low or no amount of  these fats.  Check the number of milligrams (mg) of salt (sodium) in one serving. Most people should limit total sodium intake to less than 2,300 mg per day.  Always check the nutrition information of foods labeled as "low-fat" or "nonfat". These foods may be higher in added sugar or refined carbs and should be avoided.  Talk to your dietitian to identify your daily goals for nutrients listed on the label. Shopping  Avoid buying canned, premade, or processed foods. These foods tend to be high in fat, sodium, and added sugar.  Shop around the outside edge of the grocery store. This includes fresh fruits and vegetables, bulk grains, fresh meats, and fresh dairy. Cooking  Use low-heat cooking methods, such as baking, instead of high-heat cooking methods like deep frying.  Cook using healthy oils, such as olive, canola, or sunflower oil.  Avoid cooking with butter, cream, or high-fat meats. Meal planning  Eat meals and snacks regularly, preferably at the same times every day. Avoid going long periods of time without eating.  Eat foods high in fiber, such as fresh fruits, vegetables, beans, and whole grains. Talk to your dietitian about how many servings of carbs you can eat at each meal.  Eat 4-6 ounces (oz) of lean protein each day, such as lean meat, chicken, fish, eggs, or tofu. One oz of lean protein is equal to: ? 1 oz of meat, chicken, or fish. ? 1 egg. ?  cup of tofu.  Eat some foods each day that contain healthy fats, such as avocado, nuts, seeds, and fish. Lifestyle  Check your blood glucose regularly.  Exercise regularly as told by your health care provider. This may include: ? 150 minutes of moderate-intensity or vigorous-intensity exercise each week. This could be brisk walking, biking, or water aerobics. ? Stretching and doing strength exercises, such as yoga or weightlifting, at least 2 times a week.  Take medicines as told by your health care provider.  Do not use  any products that contain nicotine or tobacco, such as cigarettes and e-cigarettes. If you need help quitting, ask your health care provider.  Work with a Veterinary surgeoncounselor or diabetes educator to identify strategies to manage stress and any emotional and social challenges. Questions to ask a health care provider  Do I need to meet with a diabetes educator?  Do I need to meet with a dietitian?  What number can I call if I have questions?  When are the best times to check my blood glucose? Where to find more information:  American Diabetes Association: diabetes.org  Academy of Nutrition and Dietetics: www.eatright.[employer]  National Institute of Diabetes and Digestive and Kidney Diseases (NIH): [employer].tnwww.niddk.nih.gov Summary  A healthy meal plan will help you control your blood glucose and maintain a healthy lifestyle.  Working with a diet and nutrition specialist (dietitian) can help you make a meal plan that is best for you.  Keep in mind that carbohydrates (carbs) and alcohol have immediate effects on your blood glucose  levels. It is important to count carbs and to use alcohol carefully. This information is not intended to replace advice given to you by your health care provider. Make sure you discuss any questions you have with your health care provider. Document Released: 07/16/2005 Document Revised: 05/19/2017 Document Reviewed: 11/23/2016 Elsevier Interactive Patient Education  [employer].    If you have lab work done today you will be contacted with your lab results within the next 2 weeks.  If you have not heard from [name] then please contact us. The fastest way to get your results is to register for My Chart.   IF you received an x-ray today, you will receive an invoice from [facility]. Please contact [facility] at [phone] with questions or concerns regarding your invoice.   IF you received labwork today, you will receive an invoice from [place]. Please  contact [facility] at [phone] with questions or concerns regarding your invoice.   Our billing staff will not be able to assist you with questions regarding bills from these companies.  You will be contacted with the lab results as soon as they are available. The fastest way to get your results is to activate your My Chart account. Instructions are located on the last page of this paperwork. If you have not heard from [name] regarding the results in 2 weeks, please contact this office.

## 2018-12-19 NOTE — Progress Notes (Signed)
Subjective:    Patient ID: Timothy Martin, male    DOB: 09-May-1946, 73 y.o.   MRN: [id number]  HPI Timothy Martin is a 73 y.o. male Presents today for: Chief Complaint  Patient presents with  . Diabetes    3 month f/u on diabetes. last seen 09/20/18 A1c was elevated at 8.0   Diabetes:  Complicated by chronic kidney disease, and diabetic neuropathy.  Has been treated with glimepiride 4 mg daily, as well as gabapentin 300 mg nightly for diabetic neuropathy.  Nephrologist Dr. Carolin Sicks.  Urine microalbumin 52.3 on December 14, 2017.  Renal function panel in December creatinine 1.78 with GFR 38.  84-monthnephrology follow-up.  Optho, foot exam, pneumovax: Up-to-date. Gabapentin working well for neuropathy.  Had cataract surgery since last visit.  Has cut back on junk food. Weight down 3 pounds.  On statin (lipitor) and ARB.  Home reading 137.  Wt Readings from Last 3 Encounters:  12/19/18 251 lb (113.9 kg)  09/20/18 254 lb 9.6 oz (115.5 kg)  06/16/18 255 lb 8 oz (115.9 kg)    Lab Results  Component Value Date   HGBA1C 8.0 (H) 09/20/2018   HGBA1C 7.2 (H) 06/16/2018   HGBA1C 7.2 (H) 03/15/2018   Lab Results  Component Value Date   MICROALBUR 1.2 05/28/2016   LDLCALC 79 06/16/2018   CREATININE 1.73 (H) 06/16/2018   Hypertension: BP Readings from Last 3 Encounters:  12/19/18 (!) 171/78  09/20/18 (!) 146/68  06/16/18 132/70   Lab Results  Component Value Date   CREATININE 1.73 (H) 06/16/2018  Have been treated with hydralazine 25 mg 3 times daily, amlodipine had to be discontinued due to swelling.  Reported home blood pressures at December nephrology visit 130s over 768s  He was continued on hydralazine, HCTZ, losartan, metoprolol, but recommended losartan at bedtime to spread out dosing of medications. Home readings 120-145/65-70's. Has been taking all meds - no missed doses, and taking losartan at night.    Patient Active Problem List   Diagnosis Date Noted  .  Diabetic retinopathy ([place] 03/03/2018  . Obesity (BMI 30-39.9) 08/03/2013  . Hypertension 03/29/2013  . DM (diabetes mellitus), type 2 with renal complications ([place] 081/11/7508 . Elevated cholesterol 03/29/2013  . 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 Diabetes mellitus   . Heart murmur   . Hyperlipidemia   . Hypertension   . Kidney stone    Past Surgical History:  Procedure Laterality Date  . CYSTOSCOPY WITH RETROGRADE PYELOGRAM, URETEROSCOPY AND STENT PLACEMENT Right 06/18/2015   Procedure: CYSTOSCOPY WITH RIGHT RETROGRADE PYELOGRAM, RIGHT URETEROSCOPY ;  Surgeon: JIrine Seal MD;  Location: [facility];  Service: Urology;  Laterality: Right;  . EYE SURGERY  11/07/2018   12/12/2018 right eye rmoval caterats  . TONSILLECTOMY     Allergies  Allergen Reactions  . Codeine Other (See Comments)    CHILDHOOD  Allergic to all forms of Codeine   Jittery cannot function  . Augmentin [Amoxicillin-Pot Clavulanate] Rash   Prior to Admission medications   Medication Sig Start Date End Date Taking? Authorizing Provider  Alcohol Swabs PADS USE TO CHECK BLOOD SUGAR DAILY. DX CODE: 250.00 02/26/16   GWendie Agreste MD  atorvastatin (LIPITOR) 10 MG tablet Take 1 tablet (10 mg total) by mouth daily. 09/20/18   GWendie Agreste MD  Blood Glucose Monitoring Suppl (BLOOD GLUCOSE METER) kit Use to test blood sugar daily. Dx code: 250.00 05/18/13   MGeorgiann Mccoy  M, PA-C  cholecalciferol (VITAMIN D) 1000 units tablet Take 2,000 Units by mouth daily.    [provider]  fish oil-omega-3 fatty acids 1000 MG capsule Take 1 g by mouth 2 (two) times daily after a meal.    [provider]  gabapentin (NEURONTIN) 300 MG capsule Take 1 capsule (300 mg total) by mouth at bedtime. 07/17/18   [name], MD  glimepiride (AMARYL) 4 MG tablet TAKE 1 TABLET BY MOUTH ONCE DAILY WITH BREAKFAST 07/17/18   [name], MD  glucose blood test strip Use as  instructed one per day 06/03/17   [name], MD  hydrALAZINE (APRESOLINE) 25 MG tablet Take 25 mg by mouth 3 (three) times daily.    [provider]  hydrochlorothiazide (HYDRODIURIL) 25 MG tablet Take 1 tablet (25 mg total) by mouth daily. 10/03/18   [name], MD  Lancets MISC Use to test blood sugar daily. Dx code:250.00 03/04/17   [name], MD  losartan (COZAAR) 100 MG tablet Take 1 tablet (100 mg total) by mouth daily. 10/03/18   [name], MD  metoprolol succinate (TOPROL-XL) 100 MG 24 hr tablet Take 1 tablet (100 mg total) by mouth daily. Take with or immediately following a meal. 03/15/18   [name], MD  Multiple Vitamins-Minerals (MULTIVITAMIN WITH MINERALS) tablet Take 1 tablet by mouth daily.    [provider]  tiZANidine (ZANAFLEX) 4 MG tablet Take 1 tablet (4 mg total) by mouth every 6 (six) hours as needed for muscle spasms. 03/17/18   Shawnee Knapp, MD  vitamin B-12 (CYANOCOBALAMIN) 100 MCG tablet Take 100 mcg by mouth every morning.     [provider]   Social History   Socioeconomic History  . Marital status: Married    Spouse name: Not on file  . Number of children: Not on file  . Years of education: Not on file  . Highest education level: Some college, no degree  Occupational History  . Not on file  Social Needs  . Financial resource strain: Not hard at all  . Food insecurity:    Worry: Never true    Inability: Never true  . Transportation needs:    Medical: No    Non-medical: No  Tobacco Use  . Smoking status: Never Smoker  . Smokeless tobacco: Never Used  Substance and Sexual Activity  . Alcohol use: No  . Drug use: No  . Sexual activity: Not on file  Lifestyle  . Physical activity:    Days per week: 4 days    Minutes per session: 30 min  . Stress: Not at all  Relationships  . Social connections:    Talks on phone: More than three times a week    Gets together: More than three times a week     Attends religious service: Never    Active member of club or organization: Yes    Attends meetings of clubs or organizations: More than 4 times per year    Relationship status: Married  . Intimate partner violence:    Fear of current or ex partner: No    Emotionally abused: No    Physically abused: No    Forced sexual activity: No  Other Topics Concern  . Not on file  Social History Narrative  . Not on file    Review of Systems  Constitutional: Negative for fatigue and unexpected weight change.  Eyes: Negative for visual disturbance.  Respiratory:  Negative for cough, chest tightness and shortness of breath.   Cardiovascular: Negative for chest pain, palpitations and leg swelling.  Gastrointestinal: Negative for abdominal pain and blood in stool.  Neurological: Negative for dizziness, light-headedness and headaches.       Objective:   Physical Exam Vitals signs reviewed.  Constitutional:      Appearance: He is well-developed.  HENT:     Head: Normocephalic and atraumatic.  Eyes:     Pupils: Pupils are equal, round, and reactive to light.  Neck:     Vascular: No carotid bruit or JVD.  Cardiovascular:     Rate and Rhythm: Normal rate and regular rhythm.     Heart sounds: Murmur (1-4/4 systolic. ) present.  Pulmonary:     Effort: Pulmonary effort is normal.     Breath sounds: Normal breath sounds. No rales.  Skin:    General: Skin is warm and dry.  Neurological:     Mental Status: He is alert and oriented to person, place, and time.    Vitals:   12/19/18 1340 12/19/18 1413  BP: (!) 171/78 (!) 146/64  Pulse: (!) 104   Resp: 14   Temp: 98 F (36.7 C)   TempSrc: Oral   SpO2: 96%   Weight: 251 lb (113.9 kg)   Height: 5' 7" (1.702 m)     Results for orders placed or performed in visit on 12/19/18  POCT glycosylated hemoglobin (Hb A1C)  Result Value Ref Range   Hemoglobin A1C 7.8 (A) 4.0 - 5.6 %   HbA1c POC (<> result, manual entry)     HbA1c, POC (prediabetic  range)     HbA1c, POC (controlled diabetic range)    POCT glucose (manual entry)  Result Value Ref Range   POC Glucose 275 (A) 70 - 99 mg/dl       Assessment & Plan:    Timothy Martin is a 73 y.o. male Type 2 diabetes mellitus with chronic kidney disease, without long-term current use of insulin, unspecified CKD stage ([place]) - Plan: POCT glycosylated hemoglobin (Hb A1C), POCT glucose (manual entry), Microalbumin / creatinine urine ratio, glimepiride (AMARYL) 4 MG tablet Type 2 diabetes mellitus with diabetic nephropathy, without long-term current use of insulin ([facility]) - Plan: glimepiride (AMARYL) 4 MG tablet  -Realistic goal may be 7.5 or below for A1c.  He is close at this time.  Plans improved diet and activity for further weight loss.  No med changes for now, recheck levels 3 months, but monitor home readings with RTC precautions.   Hyperlipidemia, unspecified hyperlipidemia type - Plan: Comprehensive metabolic panel, Lipid Panel, atorvastatin (LIPITOR) 10 MG tablet  -  Stable, tolerating current regimen. Medications refilled. Labs pending as above.   Type 2 diabetes mellitus with diabetic polyneuropathy, without long-term current use of insulin ([facility]) - Plan: Microalbumin / creatinine urine ratio, gabapentin (NEURONTIN) 300 MG capsule, glimepiride (AMARYL) 4 MG tablet  -Continue gabapentin for peripheral neuropathic symptoms.  Tolerating current dose.  Essential hypertension - Plan: hydrochlorothiazide (HYDRODIURIL) 25 MG tablet, losartan (COZAAR) 100 MG tablet, hydrALAZINE (APRESOLINE) 25 MG tablet, metoprolol succinate (TOPROL-XL) 100 MG 24 hr tablet  -Borderline.  Expect improvement with diet and exercise changes, no change in meds at this time.  Recheck 3 months  Heart murmur - Plan: ECHOCARDIOGRAM COMPLETE  -Based on review of chart appears to have had murmur for some time.  No associated symptoms.  Check echo for further evaluation.  Meds ordered this encounter  Medications  .  atorvastatin (LIPITOR) 10 MG tablet    Sig: Take 1 tablet (10 mg total) by mouth daily.    Dispense:  90 tablet    Refill:  1  . gabapentin (NEURONTIN) 300 MG capsule    Sig: Take 1 capsule (300 mg total) by mouth at bedtime.    Dispense:  90 capsule    Refill:  1  . glimepiride (AMARYL) 4 MG tablet    Sig: TAKE 1 TABLET BY MOUTH ONCE DAILY WITH BREAKFAST    Dispense:  90 tablet    Refill:  1  . hydrochlorothiazide (HYDRODIURIL) 25 MG tablet    Sig: Take 1 tablet (25 mg total) by mouth daily.    Dispense:  90 tablet    Refill:  1  . losartan (COZAAR) 100 MG tablet    Sig: Take 1 tablet (100 mg total) by mouth daily.    Dispense:  90 tablet    Refill:  1  . hydrALAZINE (APRESOLINE) 25 MG tablet    Sig: Take 1 tablet (25 mg total) by mouth 3 (three) times daily.    Dispense:  270 tablet    Refill:  1  . metoprolol succinate (TOPROL-XL) 100 MG 24 hr tablet    Sig: Take 1 tablet (100 mg total) by mouth daily. Take with or immediately following a meal.    Dispense:  90 tablet    Refill:  1   Patient Instructions    I will schedule an ultrasound to evaluate the heart murmur but it appears that has been there previously as noted on some exams past few years.  Continue to monitor home blood sugar readings and if those readings remain over 200 return to discuss other med options.  If you can continue to work on diet with weight loss that should continue to help with diabetes numbers but do recommend rechecking that level in 3 months.  See information below on diabetes and food. Increased activity/exercise should help as well.    Diabetes Mellitus and Nutrition, Adult When you have diabetes (diabetes mellitus), it is very important to have healthy eating habits because your blood sugar (glucose) levels are greatly affected by what you eat and drink. Eating healthy foods in the appropriate amounts, at about the same times every day, can help you:  Control your blood glucose.  Lower  your risk of heart disease.  Improve your blood pressure.  Reach or maintain a healthy weight. Every person with diabetes is different, and each person has different needs for a meal plan. Your health care provider may recommend that you work with a diet and nutrition specialist (dietitian) to make a meal plan that is best for you. Your meal plan may vary depending on factors such as:  The calories you need.  The medicines you take.  Your weight.  Your blood glucose, blood pressure, and cholesterol levels.  Your activity level.  Other health conditions you have, such as heart or kidney disease. How do carbohydrates affect me? Carbohydrates, also called carbs, affect your blood glucose level more than any other type of food. Eating carbs naturally raises the amount of glucose in your blood. Carb counting is a method for keeping track of how many carbs you eat. Counting carbs is important to keep your blood glucose at a healthy level, especially if you use insulin or take certain oral diabetes medicines. It is important to know how many carbs you can safely have in each meal. This is different for every  person. Your dietitian can help you calculate how many carbs you should have at each meal and for each snack. Foods that contain carbs include:  Bread, cereal, rice, pasta, and crackers.  Potatoes and corn.  Peas, beans, and lentils.  Milk and yogurt.  Fruit and juice.  Desserts, such as cakes, cookies, ice cream, and candy. How does alcohol affect me? Alcohol can cause a sudden decrease in blood glucose (hypoglycemia), especially if you use insulin or take certain oral diabetes medicines. Hypoglycemia can be a life-threatening condition. Symptoms of hypoglycemia (sleepiness, dizziness, and confusion) are similar to symptoms of having too much alcohol. If your health care provider says that alcohol is safe for you, follow these guidelines:  Limit alcohol intake to no more than 1  drink per day for nonpregnant women and 2 drinks per day for men. One drink equals 12 oz of beer, 5 oz of wine, or 1 oz of hard liquor.  Do not drink on an empty stomach.  Keep yourself hydrated with water, diet soda, or unsweetened iced tea.  Keep in mind that regular soda, juice, and other mixers may contain a lot of sugar and must be counted as carbs. What are tips for following this plan?  Reading food labels  Start by checking the serving size on the "Nutrition Facts" label of packaged foods and drinks. The amount of calories, carbs, fats, and other nutrients listed on the label is based on one serving of the item. Many items contain more than one serving per package.  Check the total grams (g) of carbs in one serving. You can calculate the number of servings of carbs in one serving by dividing the total carbs by 15. For example, if a food has 30 g of total carbs, it would be equal to 2 servings of carbs.  Check the number of grams (g) of saturated and trans fats in one serving. Choose foods that have low or no amount of these fats.  Check the number of milligrams (mg) of salt (sodium) in one serving. Most people should limit total sodium intake to less than 2,300 mg per day.  Always check the nutrition information of foods labeled as "low-fat" or "nonfat". These foods may be higher in added sugar or refined carbs and should be avoided.  Talk to your dietitian to identify your daily goals for nutrients listed on the label. Shopping  Avoid buying canned, premade, or processed foods. These foods tend to be high in fat, sodium, and added sugar.  Shop around the outside edge of the grocery store. This includes fresh fruits and vegetables, bulk grains, fresh meats, and fresh dairy. Cooking  Use low-heat cooking methods, such as baking, instead of high-heat cooking methods like deep frying.  Cook using healthy oils, such as olive, canola, or sunflower oil.  Avoid cooking with butter,  cream, or high-fat meats. Meal planning  Eat meals and snacks regularly, preferably at the same times every day. Avoid going long periods of time without eating.  Eat foods high in fiber, such as fresh fruits, vegetables, beans, and whole grains. Talk to your dietitian about how many servings of carbs you can eat at each meal.  Eat 4-6 ounces (oz) of lean protein each day, such as lean meat, chicken, fish, eggs, or tofu. One oz of lean protein is equal to: ? 1 oz of meat, chicken, or fish. ? 1 egg. ?  cup of tofu.  Eat some foods each day that contain healthy fats,  such as avocado, nuts, seeds, and fish. Lifestyle  Check your blood glucose regularly.  Exercise regularly as told by your health care provider. This may include: ? 150 minutes of moderate-intensity or vigorous-intensity exercise each week. This could be brisk walking, biking, or water aerobics. ? Stretching and doing strength exercises, such as yoga or weightlifting, at least 2 times a week.  Take medicines as told by your health care provider.  Do not use any products that contain nicotine or tobacco, such as cigarettes and e-cigarettes. If you need help quitting, ask your health care provider.  Work with a Social worker or diabetes educator to identify strategies to manage stress and any emotional and social challenges. Questions to ask a health care provider  Do I need to meet with a diabetes educator?  Do I need to meet with a dietitian?  What number can I call if I have questions?  When are the best times to check my blood glucose? Where to find more information:  American Diabetes Association: diabetes.org  Academy of Nutrition and Dietetics: www.eatright.[employer] of Diabetes and Digestive and Kidney Diseases (NIH): [employer] Summary  A healthy meal plan will help you control your blood glucose and maintain a healthy lifestyle.  Working with a diet and nutrition specialist  (dietitian) can help you make a meal plan that is best for you.  Keep in mind that carbohydrates (carbs) and alcohol have immediate effects on your blood glucose levels. It is important to count carbs and to use alcohol carefully. This information is not intended to replace advice given to you by your health care provider. Make sure you discuss any questions you have with your health care provider. Document Released: 07/16/2005 Document Revised: 05/19/2017 Document Reviewed: 11/23/2016 Elsevier Interactive Patient Education  [employer].    If you have lab work done today you will be contacted with your lab results within the next 2 weeks.  If you have not heard from [name] then please contact us. The fastest way to get your results is to register for My Chart.   IF you received an x-ray today, you will receive an invoice from [facility]. Please contact [facility] at [phone] with questions or concerns regarding your invoice.   IF you received labwork today, you will receive an invoice from [facility]. Please contact [facility] at [phone] with questions or concerns regarding your invoice.   Our billing staff will not be able to assist you with questions regarding bills from these companies.  You will be contacted with the lab results as soon as they are available. The fastest way to get your results is to activate your My Chart account. Instructions are located on the last page of this paperwork. If you have not heard from [name] regarding the results in 2 weeks, please contact this office.       Signed,   [name], MD [facility].  12/19/18 2:40 PM

## 2018-12-20 LAB — COMPREHENSIVE METABOLIC PANEL
A/G RATIO: 1.3 (ref 1.2–2.2)
ALK PHOS: 52 IU/L (ref 39–117)
ALT: 31 IU/L (ref 0–44)
AST: 32 IU/L (ref 0–40)
Albumin: 4.3 g/dL (ref 3.7–4.7)
BUN/Creatinine Ratio: 22 (ref 10–24)
BUN: 36 mg/dL — ABNORMAL HIGH (ref 8–27)
Bilirubin Total: 0.4 mg/dL (ref 0.0–1.2)
CO2: 19 mmol/L — ABNORMAL LOW (ref 20–29)
CREATININE: 1.65 mg/dL — AB (ref 0.76–1.27)
Calcium: 9.7 mg/dL (ref 8.6–10.2)
Chloride: 104 mmol/L (ref 96–106)
GFR calc Af Amer: 48 mL/min/{1.73_m2} — ABNORMAL LOW (ref >59)
GFR calc non Af Amer: 41 mL/min/{1.73_m2} — ABNORMAL LOW (ref >59)
GLOBULIN, TOTAL: 3.2 g/dL (ref 1.5–4.5)
Glucose: 213 mg/dL — ABNORMAL HIGH (ref 65–99)
POTASSIUM: 4 mmol/L (ref 3.5–5.2)
SODIUM: 142 mmol/L (ref 134–144)
Total Protein: 7.5 g/dL (ref 6.0–8.5)

## 2018-12-20 LAB — LIPID PANEL
CHOLESTEROL TOTAL: 147 mg/dL (ref 100–199)
Chol/HDL Ratio: 4.7 ratio (ref 0.0–5.0)
HDL: 31 mg/dL — ABNORMAL LOW (ref >39)
TRIGLYCERIDES: 564 mg/dL — AB (ref 0–149)

## 2018-12-20 LAB — MICROALBUMIN / CREATININE URINE RATIO
Creatinine, Urine: 95.5 mg/dL
MICROALB/CREAT RATIO: 37 mg/g{creat} — AB (ref 0–29)
Microalbumin, Urine: 34.9 ug/mL

## 2018-12-21 ENCOUNTER — Encounter: Payer: Self-pay | Admitting: Family Medicine

## 2018-12-26 ENCOUNTER — Ambulatory Visit (HOSPITAL_COMMUNITY): Payer: Medicare HMO | Attending: Cardiology

## 2018-12-26 DIAGNOSIS — R011 Cardiac murmur, unspecified: Secondary | ICD-10-CM | POA: Diagnosis not present

## 2018-12-27 ENCOUNTER — Other Ambulatory Visit: Payer: Self-pay | Admitting: Family Medicine

## 2018-12-27 DIAGNOSIS — E781 Pure hyperglyceridemia: Secondary | ICD-10-CM

## 2018-12-27 DIAGNOSIS — I350 Nonrheumatic aortic (valve) stenosis: Secondary | ICD-10-CM

## 2018-12-27 NOTE — Progress Notes (Signed)
See echo report. Referral to cardiology given aortic stenosis for recommendations.

## 2018-12-27 NOTE — Progress Notes (Signed)
See lab note.  

## 2019-01-19 ENCOUNTER — Telehealth: Payer: Self-pay | Admitting: Cardiology

## 2019-01-19 NOTE — Telephone Encounter (Signed)
   Primary Cardiologist:  Jodelle Red (new)  Patient contacted.  History reviewed.  No symptoms to suggest any unstable cardiac conditions.  Based on discussion, with current pandemic situation, we will be postponing this appointment.  If symptoms change, he has been instructed to contact our office.  He is feeling very well overall. No cardiac symptoms. Reviewed the result of his echo, with aortic valve calcification but mild-moderate stenosis. Instructed on red flag warning signs and reasons to call office sooner. He is amenable to this and rescheduling appt. Phone time 5 minutes.  Routing to C19 CANCEL pool for tracking (P CV DIV CV19 CANCEL).  Jodelle Red, MD  01/19/2019 1:37 PM      Cardiac Questionnaire:    Since your last visit or hospitalization:    1. Have you been having new or worsening chest pain? No   2. Have you been having new or worsening shortness of breath? No 3. Have you been having new or worsening leg swelling, wt gain, or increase in abdominal girth (pants fitting more tightly)? No   4. Have you had any passing out spells? No    *A YES to any of these questions would result in the appointment being kept. *If all the answers to these questions are NO, we should indicate that given the current situation regarding the worldwide coronarvirus pandemic, at the recommendation of the CDC, we are looking to limit gatherings in our waiting area, and thus will reschedule their appointment beyond four weeks from today.        Marland Kitchen

## 2019-01-19 NOTE — Telephone Encounter (Signed)
Appointment canceled.

## 2019-01-20 ENCOUNTER — Ambulatory Visit: Payer: Medicare HMO | Admitting: Cardiology

## 2019-03-14 ENCOUNTER — Ambulatory Visit: Payer: Medicare HMO | Admitting: Family Medicine

## 2019-05-03 ENCOUNTER — Encounter: Payer: Self-pay | Admitting: Cardiology

## 2019-05-03 ENCOUNTER — Other Ambulatory Visit: Payer: Self-pay

## 2019-05-03 ENCOUNTER — Ambulatory Visit (INDEPENDENT_AMBULATORY_CARE_PROVIDER_SITE_OTHER): Payer: Medicare HMO | Admitting: Cardiology

## 2019-05-03 VITALS — BP 156/70 | HR 80 | Temp 97.9°F | Ht 68.0 in | Wt 248.6 lb

## 2019-05-03 DIAGNOSIS — I10 Essential (primary) hypertension: Secondary | ICD-10-CM

## 2019-05-03 DIAGNOSIS — E669 Obesity, unspecified: Secondary | ICD-10-CM

## 2019-05-03 DIAGNOSIS — E782 Mixed hyperlipidemia: Secondary | ICD-10-CM

## 2019-05-03 DIAGNOSIS — N183 Chronic kidney disease, stage 3 unspecified: Secondary | ICD-10-CM

## 2019-05-03 DIAGNOSIS — Z712 Person consulting for explanation of examination or test findings: Secondary | ICD-10-CM

## 2019-05-03 DIAGNOSIS — I350 Nonrheumatic aortic (valve) stenosis: Secondary | ICD-10-CM | POA: Diagnosis not present

## 2019-05-03 DIAGNOSIS — Z7189 Other specified counseling: Secondary | ICD-10-CM

## 2019-05-03 DIAGNOSIS — N1832 Chronic kidney disease, stage 3b: Secondary | ICD-10-CM | POA: Insufficient documentation

## 2019-05-03 NOTE — Progress Notes (Signed)
Cardiology Office Note:    Date:  05/03/2019   ID:  Timothy Martin, DOB 1946-08-04, MRN [id number]  PCP:  Buzzy Han, MD  Cardiologist:  Buford Dresser, MD PhD  Referring MD: [name], MD   CC: establish care, review results of echocardiogram  History of Present Illness:    Timothy Martin is a 73 y.o. male with a hx of type II diabetes, chronic kidney disease, diabetic neuropathy, hypertension, hyperlipidemia who is seen as a new consult at the request of [name], MD for the evaluation and management of aortic stenosis by echo report.  He does not have any specific concerns today. Curious about the results of his echo. We reviewed this at length today with the 3D heart model and images. Discussed anatomy, what is aortic stenosis, how we monitor it, signs that warrant repeat testing, and when to fix. Discussed in broad terms how AS can be fixed.  He denies chest pain or shortness of breath. Had been going to the gym before COVID but not since. Does part time physical activity as a Retail buyer. No syncope. Was able to help someone move, lifting boxes and furniture, etc without issue in 80-85 degree heat. Has not felt limited in any of his activities.  CV risk factors:  Personal history: Long time ago was told he had a murmur, but not otherwise ever been told he has a problem with his heart.  No family history of heart disease (both parents had cancer), no one with valve issue or heart surgery.   HTN: at home 120-155/65-75, most 220U systolic. Diligent about medications. No issues with TID hydralazine. Elevated in the office today, but this is out of the norm for him.  Hyperlipidemia: on atorvastatin 10 mg, tolerating without issue. Last LDL 79. Last TG 564, was recommended to return for a fasting panel, not yet done. Not fasting today. TG in the past in the mid-200s. Takes OTC fish oil.  Type II diabetes: on glimepiride.  Past Medical History:   Diagnosis Date  . Diabetes mellitus   . Heart murmur   . Hyperlipidemia   . Hypertension   . Kidney stone     Past Surgical History:  Procedure Laterality Date  . CYSTOSCOPY WITH RETROGRADE PYELOGRAM, URETEROSCOPY AND STENT PLACEMENT Right 06/18/2015   Procedure: CYSTOSCOPY WITH RIGHT RETROGRADE PYELOGRAM, RIGHT URETEROSCOPY ;  Surgeon: [name], MD;  Location: [facility];  Service: Urology;  Laterality: Right;  . EYE SURGERY  11/07/2018   12/12/2018 right eye rmoval caterats  . TONSILLECTOMY      Current Medications: Current Outpatient Medications on File Prior to Visit  Medication Sig  . Alcohol Swabs PADS USE TO CHECK BLOOD SUGAR DAILY. DX CODE: 250.00  . atorvastatin (LIPITOR) 10 MG tablet Take 1 tablet (10 mg total) by mouth daily. (Patient taking differently: Take 10 mg by mouth 2 (two) times a day. )  . Besifloxacin HCl (BESIVANCE) 0.6 % SUSP Apply to eye.  . Blood Glucose Monitoring Suppl (BLOOD GLUCOSE METER) kit Use to test blood sugar daily. Dx code: 250.00  . Bromfenac Sodium (PROLENSA) 0.07 % SOLN Apply to eye.  . cholecalciferol (VITAMIN D) 1000 units tablet Take 2,000 Units by mouth daily.  . Difluprednate (DUREZOL) 0.05 % EMUL Apply to eye.  . ferrous sulfate 325 (65 FE) MG tablet Take 325 mg by mouth daily with breakfast.  . fish oil-omega-3 fatty acids 1000 MG capsule Take 1 g by mouth 2 (two) times daily after a  meal.  . gabapentin (NEURONTIN) 300 MG capsule Take 1 capsule (300 mg total) by mouth at bedtime.  Marland Kitchen glimepiride (AMARYL) 4 MG tablet TAKE 1 TABLET BY MOUTH ONCE DAILY WITH BREAKFAST  . glucose blood test strip Use as instructed one per day  . hydrALAZINE (APRESOLINE) 25 MG tablet Take 1 tablet (25 mg total) by mouth 3 (three) times daily.  . hydrochlorothiazide (HYDRODIURIL) 25 MG tablet Take 1 tablet (25 mg total) by mouth daily.  . Lancets MISC Use to test blood sugar daily. Dx code:250.00  . losartan (COZAAR) 100 MG tablet Take 1 tablet (100 mg total) by  mouth daily.  . metoprolol succinate (TOPROL-XL) 100 MG 24 hr tablet Take 1 tablet (100 mg total) by mouth daily. Take with or immediately following a meal.  . Multiple Vitamins-Minerals (MULTIVITAMIN WITH MINERALS) tablet Take 1 tablet by mouth daily.  . vitamin B-12 (CYANOCOBALAMIN) 100 MCG tablet Take 100 mcg by mouth every morning.    No current facility-administered medications on file prior to visit.      Allergies:   Codeine and Augmentin [amoxicillin-pot clavulanate]   Social History   Socioeconomic History  . Marital status: Married    Spouse name: Not on file  . Number of children: Not on file  . Years of education: Not on file  . Highest education level: Some college, no degree  Occupational History  . Not on file  Social Needs  . Financial resource strain: Not hard at all  . Food insecurity    Worry: Never true    Inability: Never true  . Transportation needs    Medical: No    Non-medical: No  Tobacco Use  . Smoking status: Never Smoker  . Smokeless tobacco: Never Used  Substance and Sexual Activity  . Alcohol use: No  . Drug use: No  . Sexual activity: Not on file  Lifestyle  . Physical activity    Days per week: 4 days    Minutes per session: 30 min  . Stress: Not at all  Relationships  . Social connections    Talks on phone: More than three times a week    Gets together: More than three times a week    Attends religious service: Never    Active member of club or organization: Yes    Attends meetings of clubs or organizations: More than 4 times per year    Relationship status: Married  Other Topics Concern  . Not on file  Social History Narrative  . Not on file     Family History: The patient's family history includes Cancer in his father and mother. No known bicuspid valve or aortic disease history.  ROS:   Please see the history of present illness.  Additional pertinent ROS:  Constitutional: Negative for chills, fever, night sweats,  unintentional weight loss  HENT: Negative for ear pain and hearing loss.   Eyes: Negative for loss of vision and eye pain.  Respiratory: Negative for cough, sputum, shortness of breath, wheezing.   Cardiovascular: See HPI. Gastrointestinal: Negative for abdominal pain, melena, and hematochezia.  Genitourinary: Negative for dysuria and hematuria.  Musculoskeletal: Negative for falls and myalgias.  Skin: Negative for itching and rash.  Neurological: Negative for focal weakness, focal sensory changes and loss of consciousness.  Endo/Heme/Allergies: Does not bruise/bleed easily.    EKGs/Labs/Other Studies Reviewed:    The following studies were reviewed today: Echo 12/26/18  1. The left ventricle has normal systolic function with an ejection  fraction of 60-65%. The cavity size was mildly dilated. Left ventricular diastolic Doppler parameters are consistent with impaired relaxation Elevated mean left atrial pressure.  2. The right ventricle has normal systolic function. The cavity was normal. There is no increase in right ventricular wall thickness.  3. The mitral valve is normal in structure. Mild thickening of the mitral valve leaflet. There is mild mitral annular calcification present.  4. The tricuspid valve is normal in structure.  5. The aortic valve has an indeterminant number of cusps Severe calcifcation of the aortic valve. mild-moderate stenosis of the aortic valve.  6. The pulmonic valve was normal in structure.  7. The aortic root is normal in size and structure.  8. Normal LV systolic function; mild diastolic dysfunction; mild LVE; calcified aortic valve with mild to moderate AS (mean gradient 19 mmHg).   EKG:  EKG is personally reviewed.  The ekg ordered today demonstrates NSR  Recent Labs: 12/19/2018: ALT 31; BUN 36; Creatinine, Ser 1.65; Potassium 4.0; Sodium 142  Recent Lipid Panel    Component Value Date/Time   CHOL 147 12/19/2018 1745   TRIG 564 (HH) 12/19/2018 1745    HDL 31 (L) 12/19/2018 1745   CHOLHDL 4.7 12/19/2018 1745   CHOLHDL 5.1 (H) 05/28/2016 1744   VLDL 68 (H) 05/28/2016 1744   LDLCALC Comment 12/19/2018 1745    Physical Exam:    VS:  BP (!) 156/70   Pulse 80   Temp 97.9 F (36.6 C)   Ht _0  (1.727 m)   Wt 248 lb 9.6 oz (112.8 kg)   SpO2 95%   BMI 37.80 kg/m     Wt Readings from Last 3 Encounters:  05/03/19 248 lb 9.6 oz (112.8 kg)  12/19/18 251 lb (113.9 kg)  09/20/18 254 lb 9.6 oz (115.5 kg)     GEN: Well nourished, well developed in no acute distress HEENT: Normal NECK: No JVD; No carotid bruits, does have radiating AS murmur LYMPHATICS: No lymphadenopathy CARDIAC: regular rhythm, normal S1 and S2, no rubs, gallops. 3/6 harsh early peaking SEM with soft but audible P2. Radial and DP pulses 2+ bilaterally. RESPIRATORY:  Clear to auscultation without rales, wheezing or rhonchi  ABDOMEN: Soft, non-tender, non-distended MUSCULOSKELETAL:  No edema; No deformity  SKIN: Warm and dry NEUROLOGIC:  Alert and oriented x 3 PSYCHIATRIC:  Normal affect   ASSESSMENT:    1. Aortic valve stenosis, etiology of cardiac valve disease unspecified   2. Encounter to discuss test results   3. Essential hypertension   4. Cardiac risk counseling   5. Counseling on health promotion and disease prevention   6. Mixed hyperlipidemia   7. Obesity (BMI 30-39.9)   8. CKD (chronic kidney disease) stage 3, GFR 30-59 ml/min (HCC)    PLAN:    Aortic stenosis: may be bicuspid valve, not well visualized. This would fit with his history of longstanding murmur. No family history of bicuspid valve or aortic disease history that he knows of. Ascending aorta not dilated on echo -reviewed results of echo extensively with use of model and diagrams -counseled on chest pain, HF, and syncope symptoms that need immediate medical evaluation (and echo) for progression of AS -without prior baseline, will recheck echo in 1 year given concern for bicuspid valve to  monitor for progression of disease.  Hypertension: elevated today but reports good control at home -goal <130/80 -on hydralazine 25 mg TID and adherent -on HCTZ 25 mg daily -on losartan 100 mg daily -on metoprolol  succinate 100 mg daily -if BP remains elevated, would consider adding amlodipine, changing metoprolol to carvedilol, changing HCTZ to chlorthalidone, or changing losartan to valsartan  Hyperlipidemia: mixed hypercholesterolemia and hypertriglyceridemia -ordered for fasting lipids by PCP given elevated TG -on atorvastatin 10 mg and tolerating. Given high ASCVD risk, could consider increasing to high intensity dose such as 40 mg atorvastatin -LDL 79, at goal of <100 given no known ASCVD, though with high risk would consider LDL goal <70 -due for TG recheck with PCP. If still elevated, would consider vascepa instead of OTC fish oil for treatment as well as increasing statin dose.  Chronic kidney disease, stage 3 -Cr 1.65, GFR 41 -BP, diabetes control important -followed by nephrology  Cardiac risk counseling and prevention recommendations: -recommend heart healthy/Mediterranean diet, with whole grains, fruits, vegetable, fish, lean meats, nuts, and olive oil. Limit salt. -recommend moderate walking, 3-5 times/week for 30-50 minutes each session. Aim for at least 150 minutes.week. Goal should be pace of 3 miles/hours, or walking 1.5 miles in 30 minutes -recommend avoidance of tobacco products. Avoid excess alcohol. -Additional risk factor control:  -Diabetes: A1c is 7.8 on glimepiride  -Lipids: as above  -Blood pressure control: as above  -Weight: Obese, with BMI 37. Counseled on weight loss -ASCVD risk score: The 10-year ASCVD risk score Mikey Bussing DC Brooke Bonito., et al., 2013) is: 54.7%   Values used to calculate the score:     Age: 53 years     Sex: Male     Is Non-Hispanic African American: No     Diabetic: Yes     Tobacco smoker: No     Systolic Blood Pressure: 833 mmHg     Is BP  treated: Yes     HDL Cholesterol: 31 mg/dL     Total Cholesterol: 147 mg/dL    Plan for follow up: repeat echo in 1 year, then follow up. Sooner follow up PRN for symptoms.  Medication Adjustments/Labs and Tests Ordered: Current medicines are reviewed at length with the patient today.  Concerns regarding medicines are outlined above.  Orders Placed This Encounter  Procedures  . EKG 12-Lead  . ECHOCARDIOGRAM COMPLETE   No orders of the defined types were placed in this encounter.   Patient Instructions  Medication Instructions:  Your Physician recommend you continue on your current medication as directed.    If you need a refill on your cardiac medications before your next appointment, please call your pharmacy.   Lab work: None  Testing/Procedures: Your physician has requested that you have an echocardiogram in 1 year. Echocardiography is a painless test that uses sound waves to create images of your heart. It provides your doctor with information about the size and shape of your heart and how well your heart's chambers and valves are working. This procedure takes approximately one hour. There are no restrictions for this procedure. [place] [address]   Follow-Up: At [facility], you and your health needs are our priority.  As part of our continuing mission to provide you with exceptional heart care, we have created designated Provider Care Teams.  These Care Teams include your primary Cardiologist (physician) and Advanced Practice Providers (APPs -  Physician Assistants and Nurse Practitioners) who all work together to provide you with the care you need, when you need it. You will need a follow up appointment in 1 years.  Please call our office 2 months in advance to schedule this appointment.  You may see Buford Dresser, MD  or one of the following Advanced Practice Providers on your designated Care Team:   Rosaria Ferries, PA-C . Jory Sims, DNP, ANP         Signed, Buford Dresser, MD PhD 05/03/2019 4:23 PM    [place]

## 2019-05-03 NOTE — Patient Instructions (Addendum)
Medication Instructions:  Your Physician recommend you continue on your current medication as directed.    If you need a refill on your cardiac medications before your next appointment, please call your pharmacy.   Lab work: None  Testing/Procedures: Your physician has requested that you have an echocardiogram in 1 year. Echocardiography is a painless test that uses sound waves to create images of your heart. It provides your doctor with information about the size and shape of your heart and how well your heart's chambers and valves are working. This procedure takes approximately one hour. There are no restrictions for this procedure. [place] [address]   Follow-Up: At [facility], you and your health needs are our priority.  As part of our continuing mission to provide you with exceptional heart care, we have created designated Provider Care Teams.  These Care Teams include your primary Cardiologist (physician) and Advanced Practice Providers (APPs -  Physician Assistants and Nurse Practitioners) who all work together to provide you with the care you need, when you need it. You will need a follow up appointment in 1 years.  Please call our office 2 months in advance to schedule this appointment.  You may see Buford Dresser, MD or one of the following Advanced Practice Providers on your designated Care Team:   Rosaria Ferries, PA-C . Jory Sims, DNP, ANP

## 2019-07-04 DIAGNOSIS — E113513 Type 2 diabetes mellitus with proliferative diabetic retinopathy with macular edema, bilateral: Secondary | ICD-10-CM | POA: Diagnosis not present

## 2019-07-04 DIAGNOSIS — H26491 Other secondary cataract, right eye: Secondary | ICD-10-CM | POA: Diagnosis not present

## 2019-07-11 DIAGNOSIS — H349 Unspecified retinal vascular occlusion: Secondary | ICD-10-CM | POA: Diagnosis not present

## 2019-07-11 DIAGNOSIS — E113599 Type 2 diabetes mellitus with proliferative diabetic retinopathy without macular edema, unspecified eye: Secondary | ICD-10-CM | POA: Diagnosis not present

## 2019-07-11 DIAGNOSIS — I129 Hypertensive chronic kidney disease with stage 1 through stage 4 chronic kidney disease, or unspecified chronic kidney disease: Secondary | ICD-10-CM | POA: Diagnosis not present

## 2019-07-11 DIAGNOSIS — E785 Hyperlipidemia, unspecified: Secondary | ICD-10-CM | POA: Diagnosis not present

## 2019-07-11 DIAGNOSIS — E1122 Type 2 diabetes mellitus with diabetic chronic kidney disease: Secondary | ICD-10-CM | POA: Diagnosis not present

## 2019-07-11 DIAGNOSIS — N183 Chronic kidney disease, stage 3 (moderate): Secondary | ICD-10-CM | POA: Diagnosis not present

## 2019-07-11 DIAGNOSIS — E1140 Type 2 diabetes mellitus with diabetic neuropathy, unspecified: Secondary | ICD-10-CM | POA: Diagnosis not present

## 2019-08-22 DIAGNOSIS — E113513 Type 2 diabetes mellitus with proliferative diabetic retinopathy with macular edema, bilateral: Secondary | ICD-10-CM | POA: Diagnosis not present

## 2019-08-22 DIAGNOSIS — H26491 Other secondary cataract, right eye: Secondary | ICD-10-CM | POA: Diagnosis not present

## 2019-09-25 ENCOUNTER — Other Ambulatory Visit: Payer: Self-pay | Admitting: Family Medicine

## 2019-09-25 ENCOUNTER — Other Ambulatory Visit: Payer: Self-pay

## 2019-09-25 ENCOUNTER — Ambulatory Visit
Admission: RE | Admit: 2019-09-25 | Discharge: 2019-09-25 | Disposition: A | Discharge status: Home / Self Care | Payer: Medicare HMO | Source: Ambulatory Visit | Attending: Family Medicine | Admitting: Family Medicine

## 2019-09-25 DIAGNOSIS — M25562 Pain in left knee: Secondary | ICD-10-CM

## 2019-09-25 DIAGNOSIS — M11262 Other chondrocalcinosis, left knee: Secondary | ICD-10-CM | POA: Diagnosis not present

## 2019-11-20 ENCOUNTER — Telehealth: Payer: Self-pay | Admitting: *Deleted

## 2019-11-20 NOTE — Telephone Encounter (Signed)
Schedule AWV.  

## 2019-12-20 ENCOUNTER — Other Ambulatory Visit: Payer: Self-pay | Admitting: Nephrology

## 2019-12-20 DIAGNOSIS — N281 Cyst of kidney, acquired: Secondary | ICD-10-CM

## 2020-01-04 ENCOUNTER — Ambulatory Visit
Admission: RE | Admit: 2020-01-04 | Discharge: 2020-01-04 | Disposition: A | Discharge status: Home / Self Care | Payer: Medicare HMO | Source: Ambulatory Visit | Attending: Nephrology | Admitting: Nephrology

## 2020-01-04 DIAGNOSIS — N281 Cyst of kidney, acquired: Secondary | ICD-10-CM

## 2020-01-23 NOTE — Telephone Encounter (Signed)
No action needed

## 2020-05-09 ENCOUNTER — Other Ambulatory Visit: Payer: Self-pay

## 2020-05-09 ENCOUNTER — Ambulatory Visit (HOSPITAL_COMMUNITY): Payer: Medicare HMO | Attending: Cardiology

## 2020-05-09 DIAGNOSIS — I350 Nonrheumatic aortic (valve) stenosis: Secondary | ICD-10-CM | POA: Insufficient documentation

## 2020-05-15 ENCOUNTER — Other Ambulatory Visit: Payer: Self-pay

## 2020-05-15 ENCOUNTER — Ambulatory Visit: Payer: Medicare HMO | Admitting: Cardiology

## 2020-05-15 VITALS — BP 140/73 | HR 71 | Temp 97.7°F | Ht 68.0 in | Wt 240.6 lb

## 2020-05-15 DIAGNOSIS — I350 Nonrheumatic aortic (valve) stenosis: Secondary | ICD-10-CM

## 2020-05-15 DIAGNOSIS — E669 Obesity, unspecified: Secondary | ICD-10-CM

## 2020-05-15 DIAGNOSIS — Z7189 Other specified counseling: Secondary | ICD-10-CM

## 2020-05-15 DIAGNOSIS — I10 Essential (primary) hypertension: Secondary | ICD-10-CM

## 2020-05-15 DIAGNOSIS — N1832 Chronic kidney disease, stage 3b: Secondary | ICD-10-CM

## 2020-05-15 DIAGNOSIS — E782 Mixed hyperlipidemia: Secondary | ICD-10-CM

## 2020-05-15 DIAGNOSIS — E1169 Type 2 diabetes mellitus with other specified complication: Secondary | ICD-10-CM

## 2020-05-15 NOTE — Patient Instructions (Signed)

## 2020-05-15 NOTE — Progress Notes (Signed)
Cardiology Office Note:    Date:  05/15/2020   ID:  Timothy Martin, DOB Sep 21, 1946, MRN [id number]  PCP:  Buzzy Han, MD  Cardiologist:  Buford Dresser, MD PhD  Referring MD: Buzzy Han*   CC: follow up  History of Present Illness:    Timothy Martin is a 74 y.o. male with a hx of type II diabetes, chronic kidney disease, diabetic neuropathy, hypertension, hyperlipidemia who is seen for follow up today. I initially met him 05/03/2019 as a new consult at the request of [name], [name]* for the evaluation and management of aortic stenosis by echo report.  Today: Walks 7 miles three days/week without issues (treadmill/bicycle). BP at home range 120/60-160/73 (rare highs). Most 154M systolic. Tolerating all medications. No recent labs (last 2020). Not fasting today. Has appt with Dr. Jenetta Downer in August, he thinks he will have labs at that time.  Denies chest pain, shortness of breath at rest or with normal exertion. No PND, orthopnea, LE edema or unexpected weight gain. No syncope or palpitations.  Past Medical History:  Diagnosis Date  . Diabetes mellitus   . Heart murmur   . Hyperlipidemia   . Hypertension   . Kidney stone     Past Surgical History:  Procedure Laterality Date  . CYSTOSCOPY WITH RETROGRADE PYELOGRAM, URETEROSCOPY AND STENT PLACEMENT Right 06/18/2015   Procedure: CYSTOSCOPY WITH RIGHT RETROGRADE PYELOGRAM, RIGHT URETEROSCOPY ;  Surgeon: [name], MD;  Location: [facility];  Service: Urology;  Laterality: Right;  . EYE SURGERY  11/07/2018   12/12/2018 right eye rmoval caterats  . TONSILLECTOMY      Current Medications: Current Outpatient Medications on File Prior to Visit  Medication Sig  . Alcohol Swabs PADS USE TO CHECK BLOOD SUGAR DAILY. DX CODE: 250.00  . atorvastatin (LIPITOR) 10 MG tablet Take 1 tablet (10 mg total) by mouth daily. (Patient taking differently: Take 10 mg by mouth 2 (two) times a day. )  . Besifloxacin HCl  (BESIVANCE) 0.6 % SUSP Apply to eye.  . Blood Glucose Monitoring Suppl (BLOOD GLUCOSE METER) kit Use to test blood sugar daily. Dx code: 250.00  . Bromfenac Sodium (PROLENSA) 0.07 % SOLN Apply to eye.  . cholecalciferol (VITAMIN D) 1000 units tablet Take 2,000 Units by mouth daily.  . Difluprednate (DUREZOL) 0.05 % EMUL Apply to eye.  . ferrous sulfate 325 (65 FE) MG tablet Take 325 mg by mouth daily with breakfast.  . fish oil-omega-3 fatty acids 1000 MG capsule Take 1 g by mouth 2 (two) times daily after a meal.  . gabapentin (NEURONTIN) 300 MG capsule Take 1 capsule (300 mg total) by mouth at bedtime.  Marland Kitchen glimepiride (AMARYL) 4 MG tablet TAKE 1 TABLET BY MOUTH ONCE DAILY WITH BREAKFAST  . glucose blood test strip Use as instructed one per day  . hydrALAZINE (APRESOLINE) 25 MG tablet Take 1 tablet (25 mg total) by mouth 3 (three) times daily.  . hydrochlorothiazide (HYDRODIURIL) 25 MG tablet Take 1 tablet (25 mg total) by mouth daily.  . Lancets MISC Use to test blood sugar daily. Dx code:250.00  . losartan (COZAAR) 100 MG tablet Take 1 tablet (100 mg total) by mouth daily.  . metoprolol succinate (TOPROL-XL) 100 MG 24 hr tablet Take 1 tablet (100 mg total) by mouth daily. Take with or immediately following a meal.  . Multiple Vitamins-Minerals (MULTIVITAMIN WITH MINERALS) tablet Take 1 tablet by mouth daily.  . vitamin B-12 (CYANOCOBALAMIN) 100 MCG tablet Take 100 mcg by mouth every morning.  No current facility-administered medications on file prior to visit.     Allergies:   Codeine and Augmentin [amoxicillin-pot clavulanate]   Social History   Tobacco Use  . Smoking status: Never Smoker  . Smokeless tobacco: Never Used  Vaping Use  . Vaping Use: Never used  Substance Use Topics  . Alcohol use: No  . Drug use: No    Family History: The patient's family history includes Cancer in his father and mother. No known bicuspid valve or aortic disease history.  ROS:   Please see  the history of present illness.  Additional pertinent ROS otherwise unremarkable/  EKGs/Labs/Other Studies Reviewed:    The following studies were reviewed today: Echo 05/09/20 1. Normal LV systolic function; grade 1 diastolic dysfunction; mild LVH;  calcified aortic valve wilth moderate AS (mean gradient 21 mmHg).  2. Left ventricular ejection fraction, by estimation, is 60 to 65%. The  left ventricle has normal function. The left ventricle has no regional  wall motion abnormalities. There is mild left ventricular hypertrophy.  Left ventricular diastolic parameters  are consistent with Grade I diastolic dysfunction (impaired relaxation).  Elevated left atrial pressure.  3. Right ventricular systolic function is normal. The right ventricular  size is normal.  4. The mitral valve is normal in structure. Trivial mitral valve  regurgitation. No evidence of mitral stenosis.  5. The aortic valve has an indeterminant number of cusps. Aortic valve  regurgitation is not visualized. Moderate aortic valve stenosis.   Echo 12/26/18  1. The left ventricle has normal systolic function with an ejection fraction of 60-65%. The cavity size was mildly dilated. Left ventricular diastolic Doppler parameters are consistent with impaired relaxation Elevated mean left atrial pressure.  2. The right ventricle has normal systolic function. The cavity was normal. There is no increase in right ventricular wall thickness.  3. The mitral valve is normal in structure. Mild thickening of the mitral valve leaflet. There is mild mitral annular calcification present.  4. The tricuspid valve is normal in structure.  5. The aortic valve has an indeterminant number of cusps Severe calcifcation of the aortic valve. mild-moderate stenosis of the aortic valve.  6. The pulmonic valve was normal in structure.  7. The aortic root is normal in size and structure.  8. Normal LV systolic function; mild diastolic dysfunction; mild  LVE; calcified aortic valve with mild to moderate AS (mean gradient 19 mmHg).   EKG:  EKG is personally reviewed.  The ekg ordered today demonstrates sinus rhythm with sinus arrhythmia, nonspecific ST pattern at 71 bpm  Recent Labs: No results found for requested labs within last 8760 hours.  Recent Lipid Panel    Component Value Date/Time   CHOL 147 12/19/2018 1745   TRIG 564 (HH) 12/19/2018 1745   HDL 31 (L) 12/19/2018 1745   CHOLHDL 4.7 12/19/2018 1745   CHOLHDL 5.1 (H) 05/28/2016 1744   VLDL 68 (H) 05/28/2016 1744   LDLCALC Comment 12/19/2018 1745    Physical Exam:    VS:  BP 140/73   Pulse 71   Temp 97.7 F (36.5 C)   Ht '5\' 8"'$  (1.727 m)   Wt 240 lb 9.6 oz (109.1 kg)   SpO2 95%   BMI 36.58 kg/m     Wt Readings from Last 3 Encounters:  05/15/20 240 lb 9.6 oz (109.1 kg)  05/03/19 248 lb 9.6 oz (112.8 kg)  12/19/18 251 lb (113.9 kg)    GEN: Well nourished, well developed in no acute  distress HEENT: Normal, moist mucous membranes NECK: No JVD. CARDIAC: regular rhythm, normal S1 and S2, no rubs or gallops. 3/6 harsh mid peaking with soft but audible P2 murmur. VASCULAR: Radial and DP pulses 2+ bilaterally. No carotid bruits but radiating AS murmur RESPIRATORY:  Clear to auscultation without rales, wheezing or rhonchi  ABDOMEN: Soft, non-tender, non-distended MUSCULOSKELETAL:  Ambulates independently SKIN: Warm and dry, no edema NEUROLOGIC:  Alert and oriented x 3. No focal neuro deficits noted. PSYCHIATRIC:  Normal affect   ASSESSMENT:    1. Aortic valve stenosis, etiology of cardiac valve disease unspecified   2. Essential hypertension   3. Mixed hyperlipidemia   4. Stage 3b chronic kidney disease   5. Diabetes mellitus type 2 in obese (HCC)   6. Cardiac risk counseling   7. Counseling on health promotion and disease prevention    PLAN:    Aortic stenosis: may be bicuspid valve, not well visualized. This would fit with his history of longstanding murmur.  No family history of bicuspid valve or aortic disease history that he knows of. Ascending aorta not dilated on echo -remains asymptomatic  Hypertension: elevated in office today, reports better numbers at home -goal <130/80 -on hydralazine 25 mg TID and adherent -on HCTZ 25 mg daily -on losartan 100 mg daily -on metoprolol succinate 100 mg daily -if BP remains elevated, would consider adding amlodipine, changing metoprolol to carvedilol, changing HCTZ to chlorthalidone, or changing losartan to valsartan  Hyperlipidemia: mixed hypercholesterolemia and hypertriglyceridemia -not fasting today. Needs fasting lipids given elevated TG. Will see if he can get at upcoming PCP visit -on atorvastatin 10 mg and tolerating.  -LDL 79, at goal of <100 given no known ASCVD, though with high risk would consider LDL goal <70 -due for TG recheck with PCP. If still elevated, would consider vascepa instead of OTC fish oil for treatment as well as increasing statin dose.  Type II diabetes: -last A1c 7.8 -on atorvastatin. Would also recommend aspirin -on glimepiride. Given CKD, would consider SGTL2i as next agent if GFR allows -BMI 36, consistent with obesity. Working on lifestyle change  Chronic kidney disease, stage 3 -Cr 1.76 on most recent labs per [facility] -BP, diabetes control important -followed by nephrology  Cardiac risk counseling and prevention recommendations: -recommend heart healthy/Mediterranean diet, with whole grains, fruits, vegetable, fish, lean meats, nuts, and olive oil. Limit salt. -recommend moderate walking, 3-5 times/week for 30-50 minutes each session. Aim for at least 150 minutes.week. Goal should be pace of 3 miles/hours, or walking 1.5 miles in 30 minutes -recommend avoidance of tobacco products. Avoid excess alcohol. -ASCVD risk score: The 10-year ASCVD risk score Mikey Bussing DC Brooke Bonito., et al., 2013) is: 50.4%   Values used to calculate the score:     Age: 64 years     Sex: Male     Is  Non-Hispanic African American: No     Diabetic: Yes     Tobacco smoker: No     Systolic Blood Pressure: 563 mmHg     Is BP treated: Yes     HDL Cholesterol: 31 mg/dL     Total Cholesterol: 147 mg/dL    Plan for follow up: 1 year or sooner as needed  Medication Adjustments/Labs and Tests Ordered: Current medicines are reviewed at length with the patient today.  Concerns regarding medicines are outlined above.  Orders Placed This Encounter  Procedures  . EKG 12-Lead   No orders of the defined types were placed in this encounter.  Patient Instructions  Medication Instructions:  Your Physician recommend you continue on your current medication as directed.    *If you need a refill on your cardiac medications before your next appointment, please call your pharmacy*   Lab Work: None   Testing/Procedures: None   Follow-Up: At [facility], you and your health needs are our priority.  As part of our continuing mission to provide you with exceptional heart care, we have created designated Provider Care Teams.  These Care Teams include your primary Cardiologist (physician) and Advanced Practice Providers (APPs -  Physician Assistants and Nurse Practitioners) who all work together to provide you with the care you need, when you need it.  We recommend signing up for the patient portal called "MyChart".  Sign up information is provided on this After Visit Summary.  MyChart is used to connect with patients for Virtual Visits (Telemedicine).  Patients are able to view lab/test results, encounter notes, upcoming appointments, etc.  Non-urgent messages can be sent to your provider as well.   To learn more about what you can do with MyChart, go to [email or web address].    Your next appointment:   1 year(s)  The format for your next appointment:   In Person  Provider:   Buford Dresser, MD       Signed, Buford Dresser, MD PhD 05/15/2020  [place]

## 2020-07-19 ENCOUNTER — Encounter: Payer: Self-pay | Admitting: Cardiology

## 2020-07-19 DIAGNOSIS — E669 Obesity, unspecified: Secondary | ICD-10-CM | POA: Insufficient documentation

## 2020-07-19 DIAGNOSIS — E1169 Type 2 diabetes mellitus with other specified complication: Secondary | ICD-10-CM | POA: Insufficient documentation

## 2021-01-13 ENCOUNTER — Other Ambulatory Visit: Payer: Self-pay | Admitting: Nephrology

## 2021-01-13 DIAGNOSIS — N281 Cyst of kidney, acquired: Secondary | ICD-10-CM

## 2021-01-28 ENCOUNTER — Ambulatory Visit
Admission: RE | Admit: 2021-01-28 | Discharge: 2021-01-28 | Disposition: A | Discharge status: Home / Self Care | Payer: Medicare HMO | Source: Ambulatory Visit | Attending: Nephrology | Admitting: Nephrology

## 2021-01-28 DIAGNOSIS — N281 Cyst of kidney, acquired: Secondary | ICD-10-CM

## 2021-02-28 ENCOUNTER — Ambulatory Visit: Payer: Medicare HMO | Admitting: Podiatry

## 2021-02-28 ENCOUNTER — Encounter: Payer: Self-pay | Admitting: Podiatry

## 2021-02-28 ENCOUNTER — Other Ambulatory Visit: Payer: Self-pay

## 2021-02-28 DIAGNOSIS — E119 Type 2 diabetes mellitus without complications: Secondary | ICD-10-CM

## 2021-02-28 NOTE — Progress Notes (Signed)
Subjective: Timothy Martin presents today referred by Buzzy Han, MD for diabetic foot evaluation.  Patient relates 8 year history of diabetes.  Patient denies any history of foot wounds.  Patient denies any history of numbness, tingling, burning, pins/needles sensations.  Past Medical History:  Diagnosis Date  . Diabetes mellitus   . Heart murmur   . Hyperlipidemia   . Hypertension   . Kidney stone     Patient Active Problem List   Diagnosis Date Noted  . Diabetes mellitus type 2 in obese ([place]) 07/19/2020  . Stage 3b chronic kidney disease ([place]) 05/03/2019  . Aortic valve stenosis 05/03/2019  . Mixed hyperlipidemia 05/03/2019  . Diabetic retinopathy ([place]) 03/03/2018  . Obesity (BMI 30-39.9) 08/03/2013  . Essential hypertension 03/29/2013  . DM (diabetes mellitus), type 2 with renal complications ([place]) 37/90/2409  . Elevated cholesterol 03/29/2013  . 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 CYSTOSCOPY WITH RETROGRADE PYELOGRAM, URETEROSCOPY AND STENT PLACEMENT Right 06/18/2015   Procedure: CYSTOSCOPY WITH RIGHT RETROGRADE PYELOGRAM, RIGHT URETEROSCOPY ;  Surgeon: [name], MD;  Location: [facility];  Service: Urology;  Laterality: Right;  . EYE SURGERY  11/07/2018   12/12/2018 right eye rmoval caterats  . TONSILLECTOMY      Current Outpatient Medications on File Prior to Visit  Medication Sig Dispense Refill  . Alcohol Swabs PADS USE TO CHECK BLOOD SUGAR DAILY. DX CODE: 250.00 100 each 3  . atorvastatin (LIPITOR) 10 MG tablet Take 1 tablet (10 mg total) by mouth daily. (Patient taking differently: Take 10 mg by mouth 2 (two) times a day. ) 90 tablet 1  . Besifloxacin HCl (BESIVANCE) 0.6 % SUSP Apply to eye.    . Blood Glucose Monitoring Suppl (BLOOD GLUCOSE METER) kit Use to test blood sugar daily. Dx code: 250.00 1 each 0  . Bromfenac Sodium (PROLENSA) 0.07 % SOLN Apply to eye.    . cholecalciferol (VITAMIN D)  1000 units tablet Take 2,000 Units by mouth daily.    . Difluprednate (DUREZOL) 0.05 % EMUL Apply to eye.    . ferrous sulfate 325 (65 FE) MG tablet Take 325 mg by mouth daily with breakfast.    . fish oil-omega-3 fatty acids 1000 MG capsule Take 1 g by mouth 2 (two) times daily after a meal.    . gabapentin (NEURONTIN) 300 MG capsule Take 1 capsule (300 mg total) by mouth at bedtime. 90 capsule 1  . glimepiride (AMARYL) 4 MG tablet TAKE 1 TABLET BY MOUTH ONCE DAILY WITH BREAKFAST 90 tablet 1  . glucose blood test strip Use as instructed one per day 100 each 4  . hydrALAZINE (APRESOLINE) 25 MG tablet Take 1 tablet (25 mg total) by mouth 3 (three) times daily. 270 tablet 1  . hydrochlorothiazide (HYDRODIURIL) 25 MG tablet Take 1 tablet (25 mg total) by mouth daily. 90 tablet 1  . Lancets MISC Use to test blood sugar daily. Dx code:250.00 100 each 3  . losartan (COZAAR) 100 MG tablet Take 1 tablet (100 mg total) by mouth daily. 90 tablet 1  . metoprolol succinate (TOPROL-XL) 100 MG 24 hr tablet Take 1 tablet (100 mg total) by mouth daily. Take with or immediately following a meal. 90 tablet 1  . Multiple Vitamins-Minerals (MULTIVITAMIN WITH MINERALS) tablet Take 1 tablet by mouth daily.    . vitamin B-12 (CYANOCOBALAMIN) 100 MCG tablet Take 100 mcg by mouth every morning.      No current facility-administered  medications on file prior to visit.     Allergies  Allergen Reactions  . Codeine Other (See Comments)    CHILDHOOD  Allergic to all forms of Codeine   Jittery cannot function  . Augmentin [Amoxicillin-Pot Clavulanate] Rash    Social History   Occupational History  . Not on file  Tobacco Use  . Smoking status: Never Smoker  . Smokeless tobacco: Never Used  Vaping Use  . Vaping Use: Never used  Substance and Sexual Activity  . Alcohol use: No  . Drug use: No  . Sexual activity: Not on file    Family History  Problem Relation Age of Onset  . Cancer Mother   . Cancer Father      Immunization History  Administered Date(s) Administered  . Influenza Split 08/03/2013  . Influenza, High Dose Seasonal PF 09/20/2018  . Influenza,inj,Quad PF,6+ Mos 08/03/2013, 11/16/2013, 07/19/2014, 08/26/2015, 08/27/2016, 09/09/2017  . Pneumococcal Conjugate-13 07/19/2014  . Pneumococcal Polysaccharide-23 08/02/2012  . Td 11/02/2010    Review of systems: Positive Findings in bold print.  Constitutional:  chills, fatigue, fever, sweats, weight change Communication: Optometrist, sign Ecologist, hand writing, iPad/Android device Head: headaches, head injury Eyes: changes in vision, eye pain, glaucoma, cataracts, macular degeneration, diplopia, glare,  light sensitivity, eyeglasses or contacts, blindness Ears nose mouth throat: hearing impaired, hearing aids,  ringing in ears, deaf, sign language,  vertigo, nosebleeds,  rhinitis,  cold sores, snoring, swollen glands Cardiovascular: HTN, edema, arrhythmia, pacemaker in place, defibrillator in place, chest pain/tightness, chronic anticoagulation, blood clot, heart failure, MI Peripheral Vascular: leg cramps, varicose veins, blood clots, lymphedema, varicosities Respiratory:  asthma, difficulty breathing, denies congestion, SOB, wheezing, cough, emphysema Gastrointestinal: change in appetite or weight, abdominal pain, constipation, diarrhea, nausea, vomiting, vomiting blood, change in bowel habits, abdominal pain, jaundice, rectal bleeding, hemorrhoids, GERD Genitourinary:  nocturia,  pain on urination, polyuria,  blood in urine, Foley catheter, urinary urgency, ESRD on hemodialysis Musculoskeletal: amputation, cramping, stiff joints, painful joints, decreased joint motion, fractures, OA, gout, hemiplegia, paraplegia, uses cane, wheelchair bound, uses walker, uses rollator Skin: +changes in toenails, color change, dryness, itching, mole changes,  rash, wound(s) Neurological: headaches, numbness in feet, paresthesias in feet,  burning in feet, fainting,  seizures, change in speech, migraines, memory problems/poor historian, cerebral palsy, weakness, paralysis, CVA, TIA Endocrine: diabetes, hypothyroidism, hyperthyroidism,  goiter, dry mouth, flushing, heat intolerance, cold intolerance,  excessive thirst, denies polyuria,  nocturia Hematological:  easy bleeding, excessive bleeding, easy bruising, enlarged lymph nodes, on long term blood thinner, history of past transusions Allergy/immunological:  hives, eczema, frequent infections, multiple drug allergies, seasonal allergies, transplant recipient, multiple food allergies Psychiatric:  anxiety, depression, mood disorder, suicidal ideations, hallucinations, insomnia  Objective: There were no vitals filed for this visit. Vascular Examination: Capillary refill time less than 3 seconds x 10 digits.  Dorsalis pedis pulses palpable 2 out of 4.  Posterior tibial pulses palpable 2 out of 4.  Digital hair not present x 10 digits.  Skin temperature gradient WNL b/l.  Dermatological Examination: Skin with normal turgor, texture and tone b/l  Toenails 1-5 b/l discolored, thick, dystrophic with subungual debris and pain with palpation to nailbeds due to thickness of nails.  Musculoskeletal: Muscle strength 5/5 to all LE muscle groups.  Neurological: Sensation intact with 10 gram monofilament.  Vibratory sensation intact.  Assessment: 1. NIDDM 2. Encounter for diabetic foot examination  Plan: 1. Discussed diabetic foot care principles. Literature dispensed on today. 2. Patient to continue soft, supportive shoe  gear daily. 3. Patient to report any pedal injuries to medical professional immediately. 4. Follow up one year. 5. Patient/POA to call should there be a concern in the interim.

## 2021-06-11 ENCOUNTER — Encounter (HOSPITAL_BASED_OUTPATIENT_CLINIC_OR_DEPARTMENT_OTHER): Payer: Self-pay | Admitting: Cardiology

## 2021-06-11 ENCOUNTER — Other Ambulatory Visit: Payer: Self-pay

## 2021-06-11 ENCOUNTER — Ambulatory Visit (HOSPITAL_BASED_OUTPATIENT_CLINIC_OR_DEPARTMENT_OTHER): Payer: Medicare HMO | Admitting: Cardiology

## 2021-06-11 VITALS — BP 158/62 | HR 62 | Ht 68.0 in | Wt 230.6 lb

## 2021-06-11 DIAGNOSIS — I350 Nonrheumatic aortic (valve) stenosis: Secondary | ICD-10-CM | POA: Diagnosis not present

## 2021-06-11 DIAGNOSIS — E782 Mixed hyperlipidemia: Secondary | ICD-10-CM | POA: Diagnosis not present

## 2021-06-11 DIAGNOSIS — E1169 Type 2 diabetes mellitus with other specified complication: Secondary | ICD-10-CM

## 2021-06-11 DIAGNOSIS — Z7189 Other specified counseling: Secondary | ICD-10-CM

## 2021-06-11 DIAGNOSIS — I10 Essential (primary) hypertension: Secondary | ICD-10-CM | POA: Diagnosis not present

## 2021-06-11 DIAGNOSIS — N1832 Chronic kidney disease, stage 3b: Secondary | ICD-10-CM | POA: Diagnosis not present

## 2021-06-11 DIAGNOSIS — E669 Obesity, unspecified: Secondary | ICD-10-CM

## 2021-06-11 NOTE — Progress Notes (Signed)
Cardiology Office Note:    Date:  06/11/2021   ID:  Timothy Martin, DOB 04-Aug-1946, MRN [id number]  PCP:  Buzzy Han, MD  Cardiologist:  Buford Dresser, MD PhD  Referring MD: Buzzy Han*   CC: follow up  History of Present Illness:    Timothy Martin is a 75 y.o. male with a hx of type II diabetes, chronic kidney disease, diabetic neuropathy, hypertension, hyperlipidemia who is seen for follow up today. I initially met him 05/03/2019 as a new consult at the request of Timothy Martin, Timothy Martin* for the evaluation and management of aortic stenosis by echo report.  Today: At home his blood pressure has been ranging from 118-152/70s. He endorses occasional swelling in his feet, especially during this hot weather. Otherwise he appears well overall.  For activity he is now exercising on a treadmill 3 times a week for about 40 minutes and 3-6 miles. He feels well while exercising.  He continues to take his medication religiously. Also, he self-discontinued his fluid pills due to urinary frequency. He reports drinking plenty of water.  Every 4-5 months his kidney management is followed by Dr. Carolin Martin at [facility].  He denies any palpitations, chest pain, or shortness of breath. No lightheadedness, headaches, syncope, orthopnea, or PND.  His ECG was read by the computer today as possible STEMI. He is chest pain free, and ECG is similar to prior (more prominent in V2).   Past Medical History:  Diagnosis Date   Diabetes mellitus    Heart murmur    Hyperlipidemia    Hypertension    Kidney stone     Past Surgical History:  Procedure Laterality Date   CYSTOSCOPY WITH RETROGRADE PYELOGRAM, URETEROSCOPY AND STENT PLACEMENT Right 06/18/2015   Procedure: CYSTOSCOPY WITH RIGHT RETROGRADE PYELOGRAM, RIGHT URETEROSCOPY ;  Surgeon: [name], MD;  Location: [facility];  Service: Urology;  Laterality: Right;   EYE SURGERY  11/07/2018   12/12/2018 right eye rmoval  caterats   TONSILLECTOMY      Current Medications: Current Outpatient Medications on File Prior to Visit  Medication Sig   Alcohol Swabs PADS USE TO CHECK BLOOD SUGAR DAILY. DX CODE: 250.00   atorvastatin (LIPITOR) 10 MG tablet Take 1 tablet (10 mg total) by mouth daily.   Besifloxacin HCl 0.6 % SUSP Apply to eye.   Blood Glucose Monitoring Suppl (BLOOD GLUCOSE METER) kit Use to test blood sugar daily. Dx code: 250.00   Bromfenac Sodium 0.07 % SOLN Apply to eye.   cholecalciferol (VITAMIN D) 1000 units tablet Take 2,000 Units by mouth daily.   Difluprednate 0.05 % EMUL Apply to eye.   fish oil-omega-3 fatty acids 1000 MG capsule Take 1 g by mouth 2 (two) times daily after a meal.   glimepiride (AMARYL) 4 MG tablet TAKE 1 TABLET BY MOUTH ONCE DAILY WITH BREAKFAST   glucose blood test strip Use as instructed one per day   hydrALAZINE (APRESOLINE) 25 MG tablet Take 1 tablet (25 mg total) by mouth 3 (three) times daily.   hydrochlorothiazide (HYDRODIURIL) 25 MG tablet Take 1 tablet (25 mg total) by mouth daily.   Lancets MISC Use to test blood sugar daily. Dx code:250.00   losartan (COZAAR) 100 MG tablet Take 1 tablet (100 mg total) by mouth daily.   metoprolol succinate (TOPROL-XL) 100 MG 24 hr tablet Take 1 tablet (100 mg total) by mouth daily. Take with or immediately following a meal.   Multiple Vitamins-Minerals (MULTIVITAMIN WITH MINERALS) tablet Take 1 tablet by mouth  daily.   vitamin B-12 (CYANOCOBALAMIN) 100 MCG tablet Take 100 mcg by mouth every morning.    No current facility-administered medications on file prior to visit.     Allergies:   Codeine and Augmentin [amoxicillin-pot clavulanate]   Social History   Tobacco Use   Smoking status: Never   Smokeless tobacco: Never  Vaping Use   Vaping Use: Never used  Substance Use Topics   Alcohol use: No   Drug use: No    Family History: The patient's family history includes Cancer in his father and mother. No known  bicuspid valve or aortic disease history.  ROS:   Please see the history of present illness.   (+) Bilateral LE edema, feet Additional pertinent ROS otherwise unremarkable.  EKGs/Labs/Other Studies Reviewed:    The following studies were reviewed today:  Echo 05/09/20  1. Normal LV systolic function; grade 1 diastolic dysfunction; mild LVH;  calcified aortic valve wilth moderate AS (mean gradient 21 mmHg).   2. Left ventricular ejection fraction, by estimation, is 60 to 65%. The  left ventricle has normal function. The left ventricle has no regional  wall motion abnormalities. There is mild left ventricular hypertrophy.  Left ventricular diastolic parameters  are consistent with Grade I diastolic dysfunction (impaired relaxation).  Elevated left atrial pressure.   3. Right ventricular systolic function is normal. The right ventricular  size is normal.   4. The mitral valve is normal in structure. Trivial mitral valve  regurgitation. No evidence of mitral stenosis.   5. The aortic valve has an indeterminant number of cusps. Aortic valve  regurgitation is not visualized. Moderate aortic valve stenosis.   Echo 12/26/18  1. The left ventricle has normal systolic function with an ejection fraction of 60-65%. The cavity size was mildly dilated. Left ventricular diastolic Doppler parameters are consistent with impaired relaxation Elevated mean left atrial pressure.  2. The right ventricle has normal systolic function. The cavity was normal. There is no increase in right ventricular wall thickness.  3. The mitral valve is normal in structure. Mild thickening of the mitral valve leaflet. There is mild mitral annular calcification present.  4. The tricuspid valve is normal in structure.  5. The aortic valve has an indeterminant number of cusps Severe calcifcation of the aortic valve. mild-moderate stenosis of the aortic valve.  6. The pulmonic valve was normal in structure.  7. The aortic root  is normal in size and structure.  8. Normal LV systolic function; mild diastolic dysfunction; mild LVE; calcified aortic valve with mild to moderate AS (mean gradient 19 mmHg).    EKG:  EKG is personally reviewed.   06/11/2021: Sinus rhythm with sinus arrhythmia. Computer read as possible STEMI. He is chest pain free. Compared to prior, overall similar pattern, upsloping ST portions in V1-V2 more prominent than prior but clinically not consistent with STEMI. 05/15/2020: sinus rhythm with sinus arrhythmia, nonspecific ST pattern at 71 bpm  Recent Labs: No results found for requested labs within last 8760 hours.   Recent Lipid Panel    Component Value Date/Time   CHOL 147 12/19/2018 1745   TRIG 564 (HH) 12/19/2018 1745   HDL 31 (L) 12/19/2018 1745   CHOLHDL 4.7 12/19/2018 1745   CHOLHDL 5.1 (H) 05/28/2016 1744   VLDL 68 (H) 05/28/2016 1744   LDLCALC Comment 12/19/2018 1745    Physical Exam:    VS:  BP (!) 158/62   Pulse 62   Ht _0  (1.727 m)  Wt 230 lb 9.6 oz (104.6 kg)   BMI 35.06 kg/m     Wt Readings from Last 3 Encounters:  06/11/21 230 lb 9.6 oz (104.6 kg)  05/15/20 240 lb 9.6 oz (109.1 kg)  05/03/19 248 lb 9.6 oz (112.8 kg)    GEN: Well nourished, well developed in no acute distress HEENT: Normal, moist mucous membranes NECK: No JVD. CARDIAC: regular rhythm, normal S1 and S2, no rubs or gallops. 3/6 harsh mid peaking systolic murmur with soft but audible P2 VASCULAR: Radial and DP pulses 2+ bilaterally. No carotid bruits but radiating AS murmur RESPIRATORY:  Clear to auscultation without rales, wheezing or rhonchi  ABDOMEN: Soft, non-tender, non-distended MUSCULOSKELETAL:  Ambulates independently SKIN: Warm and dry, no edema NEUROLOGIC:  Alert and oriented x 3. No focal neuro deficits noted. PSYCHIATRIC:  Normal affect   ASSESSMENT:    1. Nonrheumatic aortic valve stenosis   2. Essential hypertension   3. Stage 3b chronic kidney disease ([place])   4. Mixed  hyperlipidemia   5. Diabetes mellitus type 2 in obese (HCC)   6. Cardiac risk counseling   7. Counseling on health promotion and disease prevention     PLAN:    Aortic stenosis: may be bicuspid valve, not well visualized. This would fit with his history of longstanding murmur. No family history of bicuspid valve or aortic disease history that he knows of. Ascending aorta not dilated on echo -remains asymptomatic -monitor echo in 1 year, with visit after -counseled on red flag warning signs that need immediate medical attention  Hypertension: -consistently elevated in the office and well controlled at home -goal <130/80, he will contact me if home numbers are above this -on hydralazine 25 mg TID and adherent -on HCTZ 25 mg daily -on losartan 100 mg daily -on metoprolol succinate 100 mg daily -if BP remains elevated, would consider adding amlodipine, changing metoprolol to carvedilol, changing HCTZ to chlorthalidone, or changing losartan to valsartan  Hyperlipidemia: mixed hypercholesterolemia and hypertriglyceridemia -Needs fasting lipids given elevated TG, order for fasting lipids placed today -on atorvastatin 10 mg and tolerating.  -LDL 79, at goal of <100 given no known ASCVD, though with high risk would consider LDL goal <70  Type II diabetes: -last A1c 7.8 -on atorvastatin. Would also recommend aspirin -on glimepiride. Given CKD, would consider SGTL2i as next agent if GFR allows. Will ask his nephrologist to weigh in on this. -BMI 35, consistent with obesity. Working on lifestyle change  Chronic kidney disease, stage 3 -Cr 1.70 on most recent labs per [facility] -BP, diabetes control important -followed by nephrology  Cardiac risk counseling and prevention recommendations: -recommend heart healthy/Mediterranean diet, with whole grains, fruits, vegetable, fish, lean meats, nuts, and olive oil. Limit salt. -recommend moderate walking, 3-5 times/week for 30-50 minutes each session.  Aim for at least 150 minutes.week. Goal should be pace of 3 miles/hours, or walking 1.5 miles in 30 minutes -recommend avoidance of tobacco products. Avoid excess alcohol. -ASCVD risk score: The 10-year ASCVD risk score Mikey Bussing DC Brooke Bonito., et al., 2013) is: 60.7%   Values used to calculate the score:     Age: 89 years     Sex: Male     Is Non-Hispanic African American: No     Diabetic: Yes     Tobacco smoker: No     Systolic Blood Pressure: 161 mmHg     Is BP treated: Yes     HDL Cholesterol: 31 mg/dL     Total Cholesterol: 147 mg/dL  Plan for follow up: 1 year after repeat Echo or sooner as needed  Medication Adjustments/Labs and Tests Ordered: Current medicines are reviewed at length with the patient today.  Concerns regarding medicines are outlined above.   Orders Placed This Encounter  Procedures   Lipid panel   EKG 12-Lead   ECHOCARDIOGRAM COMPLETE    No orders of the defined types were placed in this encounter.  Patient Instructions  Medication Instructions:  Your Physician recommend you continue on your current medication as directed.    *If you need a refill on your cardiac medications before your next appointment, please call your pharmacy*   Lab Work: Your physician recommends that you return for lab work at our [facility] ([address] [place] [address], no lab appointment needed)  -Fasting lipid   If you have labs (blood work) drawn today and your tests are completely normal, you will receive your results only by: [place] (if you have [place]) OR A paper copy in the mail If you have any lab test that is abnormal or we need to change your treatment, we will call you to review the results.   Testing/Procedures: Your physician has requested that you have an echocardiogram in 1 year. Echocardiography is a painless test that uses sound waves to create images of your heart. It provides your doctor with information about the size and shape of  your heart and how well your heart's chambers and valves are working. This procedure takes approximately one hour. There are no restrictions for this procedure. [address]    Follow-Up: At [facility], you and your health needs are our priority.  As part of our continuing mission to provide you with exceptional heart care, we have created designated Provider Care Teams.  These Care Teams include your primary Cardiologist (physician) and Advanced Practice Providers (APPs -  Physician Assistants and Nurse Practitioners) who all work together to provide you with the care you need, when you need it.  We recommend signing up for the patient portal called "MyChart".  Sign up information is provided on this After Visit Summary.  MyChart is used to connect with patients for Virtual Visits (Telemedicine).  Patients are able to view lab/test results, encounter notes, upcoming appointments, etc.  Non-urgent messages can be sent to your provider as well.   To learn more about what you can do with MyChart, go to [email or web address].    Your next appointment:   1 year(s)  The format for your next appointment:   In Person  Provider:   Buford Dresser, MD    [facility] Stumpf,acting as a scribe for Buford Dresser, MD.,have documented all relevant documentation on the behalf of Buford Dresser, MD,as directed by  Buford Dresser, MD while in the presence of Buford Dresser, MD.  I, Buford Dresser, MD, have reviewed all documentation for this visit. The documentation on 06/11/21 for the exam, diagnosis, procedures, and orders are all accurate and complete.   Signed, Buford Dresser, MD PhD 06/11/2021  [place]

## 2021-06-11 NOTE — Patient Instructions (Signed)
Medication Instructions:  Your Physician recommend you continue on your current medication as directed.    *If you need a refill on your cardiac medications before your next appointment, please call your pharmacy*   Lab Work: Your physician recommends that you return for lab work at our [facility] ([address], [place] [address], no lab appointment needed)  -Fasting lipid   If you have labs (blood work) drawn today and your tests are completely normal, you will receive your results only by: MyChart Message (if you have MyChart) OR A paper copy in the mail If you have any lab test that is abnormal or we need to change your treatment, we will call you to review the results.   Testing/Procedures: Your physician has requested that you have an echocardiogram in 1 year. Echocardiography is a painless test that uses sound waves to create images of your heart. It provides your doctor with information about the size and shape of your heart and how well your heart's chambers and valves are working. This procedure takes approximately one hour. There are no restrictions for this procedure. [address]    Follow-Up: At [facility], you and your health needs are our priority.  As part of our continuing mission to provide you with exceptional heart care, we have created designated Provider Care Teams.  These Care Teams include your primary Cardiologist (physician) and Advanced Practice Providers (APPs -  Physician Assistants and Nurse Practitioners) who all work together to provide you with the care you need, when you need it.  We recommend signing up for the patient portal called "MyChart".  Sign up information is provided on this After Visit Summary.  MyChart is used to connect with patients for Virtual Visits (Telemedicine).  Patients are able to view lab/test results, encounter notes, upcoming appointments, etc.  Non-urgent messages can be sent to your provider  as well.   To learn more about what you can do with MyChart, go to [email or web address].    Your next appointment:   1 year(s)  The format for your next appointment:   In Person  Provider:   Jodelle Red, MD

## 2021-06-20 LAB — LIPID PANEL
Chol/HDL Ratio: 3.6 ratio (ref 0.0–5.0)
Cholesterol, Total: 119 mg/dL (ref 100–199)
HDL: 33 mg/dL — ABNORMAL LOW (ref >39)
LDL Chol Calc (NIH): 67 mg/dL (ref 0–99)
Triglycerides: 98 mg/dL (ref 0–149)
VLDL Cholesterol Cal: 19 mg/dL (ref 5–40)

## 2021-07-18 ENCOUNTER — Telehealth: Payer: Self-pay | Admitting: Cardiology

## 2021-07-18 NOTE — Telephone Encounter (Signed)
Follow up:    Patient returning a call back concering results.  

## 2021-07-18 NOTE — Telephone Encounter (Signed)
Jodelle Red, MD  07/12/2021  2:15 PM EDT     Closing the loop--fasting lipids at goal, continue current medications   Patient aware and verbalized understanding.

## 2022-05-31 ENCOUNTER — Emergency Department: Admit: 2022-06-01 | Payer: MEDICARE

## 2022-05-31 ENCOUNTER — Emergency Department: Payer: MEDICARE

## 2022-05-31 DIAGNOSIS — K255 Chronic or unspecified gastric ulcer with perforation: Secondary | ICD-10-CM

## 2022-05-31 DIAGNOSIS — A4151 Sepsis due to Escherichia coli [E. coli]: Secondary | ICD-10-CM

## 2022-05-31 NOTE — ED Provider Notes (Signed)
EMERGENCY DEPARTMENT HISTORY AND PHYSICAL EXAM      Date: 05/31/2022  Patient Name: Jaime [name]    History of Presenting Illness     Chief Complaint   Patient presents with    Hypotension     Hx hypertension - BP is 70s/40s    Altered Mental Status    Nausea    Emesis         HPI: History From: EMS and patient, History limited by: Lethargy, somnolence  Jaime [name], 76 y.o. male presents to the ED with cc of nausea vomiting and generalized weakness.  Per EMS, he seemed altered on their arrival, he was oriented but his speech was slow and he seemed lethargic.  He states he has been feeling sick for the past couple days with associated nausea and vomiting increasing generalized weakness.  He reports cough and shortness of breath as well as fever.  He denies any chest pain.  He denies any abdominal pain, however has abdominal tenderness on exam.  He is able to tell me that he has a history of hypertension and diabetes.  He denies taking any blood thinners.  He is visiting from [place].  He was hypoxic and hypotensive per EMS.          There are no other complaints, changes, or physical findings at this time.    PCP: No primary care provider on file.    No current facility-administered medications on file prior to encounter.     Current Outpatient Medications on File Prior to Encounter   Medication Sig Dispense Refill    metFORMIN (GLUCOPHAGE) 500 MG tablet Take 1 tablet by mouth daily      metoprolol (LOPRESSOR) 100 MG tablet Take 1 tablet by mouth daily      hydrALAZINE (APRESOLINE) 25 MG tablet Take 1 tablet by mouth 3 times daily      atorvastatin (LIPITOR) 40 MG tablet Take 1 tablet by mouth daily      losartan (COZAAR) 100 MG tablet Take 1 tablet by mouth daily         Past History     Past Medical History:  No past medical history on file.    Past Surgical History:  No past surgical history on file.    Family History:  No family history on file.    Social History:       Allergies:  Allergies    Allergen Reactions    Augmentin [Amoxicillin-Pot Clavulanate] Hives    Codeine      Unknown reaction         Physical Exam   Physical Exam  Constitutional:       Appearance: He is ill-appearing and toxic-appearing.      Comments: Lethargic but arousable to voice   HENT:      Head: Normocephalic and atraumatic.      Mouth/Throat:      Comments: Mucous membranes are dry  Eyes:      Pupils: Pupils are equal, round, and reactive to light.   Cardiovascular:      Rate and Rhythm: Regular rhythm. Tachycardia present.   Pulmonary:      Comments: He is tachypneic, coarse breath sounds on inspiration at bilateral bases  Abdominal:      Palpations: Abdomen is soft.      Comments: Tender to palpation diffusely in the right sided abdomen without guarding or rebound tenderness   Musculoskeletal:      Cervical back: Neck supple.  Skin:     General: Skin is dry.      Coloration: Skin is pale.   Neurological:      Comments: Patient is lethargic but arousable to voice, able to tell me his name, place, and year.  symmetric grip strength bilaterally.  Symmetric facial expressions.       Diagnostic Study Results     Labs -     Recent Results (from the past 24 hour(s))   EKG 12 Lead    Collection Time: 05/31/22 11:48 PM   Result Value Ref Range    Ventricular Rate 108 BPM    Atrial Rate 108 BPM    P-R Interval 148 ms    QRS Duration 76 ms    Q-T Interval 372 ms    QTc Calculation (Bazett) 498 ms    P Axis 150 degrees    R Axis -27 degrees    T Axis 151 degrees    Diagnosis       Unusual P axis, possible ectopic atrial tachycardia  T wave abnormality, consider lateral ischemia  No previous ECGs available     POCT Blood Gas & Electrolytes    Collection Time: 05/31/22 11:59 PM   Result Value Ref Range    PH, VENOUS (POC) 7.37 7.32 - 7.42      PCO2, Venus, POC 26.6 (L) 41 - 51 MMHG    PO2, VENOUS (POC) 46 (H) 25 - 40 mmHg    HCO3, Arterial 15 mmol/L    BASE DEFICIT (POC) 8.1 mmol/L    POC O2 SAT 82 %    POC Sodium 141 136 - 145 MMOL/L     POC Potassium 3.8 3.5 - 5.5 MMOL/L    POC Chloride 109 (H) 100 - 108 MMOL/L    POC TCO2 15 (L) 19 - 24 MMOL/L    Anion Gap, POC 17 10 - 20      POC Glucose 271 (H) 74 - 106 MG/DL    POC Creatinine 4.0 (H) 0.6 - 1.3 MG/DL    eGFR, POC 15 (L) >60 ml/min/1.21m    POC Ionized Calcium 1.09 (L) 1.12 - 1.32 mmol/L    POC Lactic Acid 10.32 (HH) 0.40 - 2.00 mmol/L    Source VENOUS BLOOD      Critical Value Read Back EDMD    CBC with Auto Differential    Collection Time: 06/01/22 12:03 AM   Result Value Ref Range    WBC 16.6 (H) 4.1 - 11.1 K/uL    RBC 4.66 4.10 - 5.70 M/uL    Hemoglobin 13.7 12.1 - 17.0 g/dL    Hematocrit 42.5 36.6 - 50.3 %    MCV 91.2 80.0 - 99.0 FL    MCH 29.4 26.0 - 34.0 PG    MCHC 32.2 30.0 - 36.5 g/dL    RDW 14.6 (H) 11.5 - 14.5 %    Platelets 60 (L) 150 - 400 K/uL    MPV ABNORMAL 8.9 - 12.9 FL    Nucleated RBCs 0.1 (H) 0 PER 100 WBC    nRBC 0.02 (H) 0.00 - 0.01 K/uL    Neutrophils % PENDING %    Lymphocytes % PENDING %    Monocytes % PENDING %    Eosinophils % PENDING %    Basophils % PENDING %    Immature Granulocytes PENDING %    Neutrophils Absolute PENDING K/UL    Lymphocytes Absolute PENDING K/UL    Monocytes Absolute PENDING K/UL    Eosinophils  Absolute PENDING K/UL    Basophils Absolute PENDING K/UL    Absolute Immature Granulocyte PENDING K/UL    Differential Type PENDING    Comprehensive Metabolic Panel    Collection Time: 06/01/22 12:03 AM   Result Value Ref Range    Sodium 139 136 - 145 mmol/L    Potassium 3.9 3.5 - 5.1 mmol/L    Chloride 103 97 - 108 mmol/L    CO2 19 (L) 21 - 32 mmol/L    Anion Gap 17 (H) 5 - 15 mmol/L    Glucose 272 (H) 65 - 100 mg/dL    BUN 66 (H) 6 - 20 MG/DL    Creatinine 4.06 (H) 0.70 - 1.30 MG/DL    Bun/Cre Ratio 16 12 - 20      Est, Glom Filt Rate 15 (L) >60 ml/min/1.51m    Calcium 9.1 8.5 - 10.1 MG/DL    Total Bilirubin 4.8 (H) 0.2 - 1.0 MG/DL    ALT 658 (H) 12 - 78 U/L    AST 812 (H) 15 - 37 U/L    Alk Phosphatase 162 (H) 45 - 117 U/L    Total Protein 6.9 6.4 -  8.2 g/dL    Albumin 3.2 (L) 3.5 - 5.0 g/dL    Globulin 3.7 2.0 - 4.0 g/dL    Albumin/Globulin Ratio 0.9 (L) 1.1 - 2.2     Procalcitonin    Collection Time: 06/01/22 12:03 AM   Result Value Ref Range    Procalcitonin 105.82 ng/mL   Troponin    Collection Time: 06/01/22 12:03 AM   Result Value Ref Range    Troponin, High Sensitivity 77 (H) 0 - 76 ng/L   Brain Natriuretic Peptide    Collection Time: 06/01/22 12:03 AM   Result Value Ref Range    NT Pro-BNP 20,980 (H) <450 PG/ML   ETOH    Collection Time: 06/01/22 12:03 AM   Result Value Ref Range    Ethanol Lvl <10 <10 MG/DL   Protime-INR    Collection Time: 06/01/22 12:03 AM   Result Value Ref Range    INR 1.4 (H) 0.9 - 1.1      Protime 14.6 (H) 9.0 - 11.1 sec   Magnesium    Collection Time: 06/01/22 12:03 AM   Result Value Ref Range    Magnesium 1.7 1.6 - 2.4 mg/dL   D-Dimer, Quantitative    Collection Time: 06/01/22 12:03 AM   Result Value Ref Range    D-Dimer, Quant >35.20 (H) 0.00 - 0.65 mg/L FEU   Lipase    Collection Time: 06/01/22 12:03 AM   Result Value Ref Range    Lipase >3,000 (H) 73 - 393 U/L   COVID-19, Rapid    Collection Time: 06/01/22  2:04 AM    Specimen: Nasopharyngeal   Result Value Ref Range    Source Nasopharyngeal      SARS-CoV-2, Rapid Not detected NOTD     Basic Metabolic Panel    Collection Time: 06/01/22  2:04 AM   Result Value Ref Range    Sodium 139 136 - 145 mmol/L    Potassium 3.9 3.5 - 5.1 mmol/L    Chloride 106 97 - 108 mmol/L    CO2 15 (LL) 21 - 32 mmol/L    Anion Gap 18 (H) 5 - 15 mmol/L    Glucose 233 (H) 65 - 100 mg/dL    BUN 64 (H) 6 - 20 MG/DL    Creatinine 3.90 (H) 0.70 -  1.30 MG/DL    Bun/Cre Ratio 16 12 - 20      Est, Glom Filt Rate 15 (L) >60 ml/min/1.28m    Calcium 8.3 (L) 8.5 - 10.1 MG/DL   Magnesium    Collection Time: 06/01/22  2:04 AM   Result Value Ref Range    Magnesium 1.5 (L) 1.6 - 2.4 mg/dL   Phosphorus    Collection Time: 06/01/22  2:04 AM   Result Value Ref Range    Phosphorus 3.6 2.6 - 4.7 MG/DL   TYPE AND SCREEN     Collection Time: 06/01/22  2:04 AM   Result Value Ref Range    Crossmatch expiration date 06/04/2022,2359     ABO/Rh A POSITIVE     Antibody Screen NEG    Blood Gas, Arterial    Collection Time: 06/01/22  3:43 AM   Result Value Ref Range    pH, Arterial 7.30 (L) 7.35 - 7.45      pCO2, Arterial 36 35 - 45 mmHg    pO2, Arterial 112 (H) 80 - 100 mmHg    POC O2 SAT 98 (H) 92 - 97 %    HCO3, Arterial 17 (L) 22 - 26 mmol/L    Base deficit, arterial blood 8.4 mmol/L    O2 Method VENT      FIO2 Arterial 75 %    Mode ASSIST CONTROL      POC TIDAL VOLUME 525.0      Set Rate, POC 18      POC PEEP/CPA 5.0      Source ARTERIAL      Site DRAWN FROM ARTERIAL LINE      Allen Test NOT APPLICABLE     Arterial Blood Gas, POC    Collection Time: 06/01/22  5:50 AM   Result Value Ref Range    FIO2 70 %    pH, Arterial, POC 7.21 (LL) 7.35 - 7.45      pCO2, Arterial, POC 45.5 (H) 35.0 - 45.0 MMHG    pO2, Arterial, POC 96 80 - 100 MMHG    HCO3, Mixed 18.3 (L) 22 - 26 MMOL/L    SO2c, Arterial, POC 95.7 92 - 97 %    Base Deficit 9.3 mmol/L    Site DRAWN FROM ARTERIAL LINE      DEVICE ADULT VENT      Mode ASSIST CONTROL      POC TIDAL VOLUME 500 ml    Set Rate 16 bpm    POC PEEP 5 cmH2O    POC Allen's Test NOT APPLICABLE      Specimen type: ARTERIAL      Critical Value Read Back [facility]    CBC    Collection Time: 06/01/22  5:51 AM   Result Value Ref Range    WBC 14.6 (H) 4.1 - 11.1 K/uL    RBC 4.15 4.10 - 5.70 M/uL    Hemoglobin 12.2 12.1 - 17.0 g/dL    Hematocrit 37.6 36.6 - 50.3 %    MCV 90.6 80.0 - 99.0 FL    MCH 29.4 26.0 - 34.0 PG    MCHC 32.4 30.0 - 36.5 g/dL    RDW 14.8 (H) 11.5 - 14.5 %    Platelets 50 (L) 150 - 400 K/uL    MPV ABNORMAL 8.9 - 12.9 FL    Nucleated RBCs 0.1 (H) 0 PER 100 WBC    nRBC 0.02 (H) 0.00 - 0.01 K/uL   Basic Metabolic Panel  Collection Time: 06/01/22  5:51 AM   Result Value Ref Range    Sodium 142 136 - 145 mmol/L    Potassium 4.8 3.5 - 5.1 mmol/L    Chloride 110 (H) 97 - 108 mmol/L    CO2 18 (L) 21 - 32  mmol/L    Anion Gap 14 5 - 15 mmol/L    Glucose 196 (H) 65 - 100 mg/dL    BUN 64 (H) 6 - 20 MG/DL    Creatinine 3.71 (H) 0.70 - 1.30 MG/DL    Bun/Cre Ratio 17 12 - 20      Est, Glom Filt Rate 16 (L) >60 ml/min/1.32m    Calcium 9.1 8.5 - 10.1 MG/DL   Lactic Acid    Collection Time: 06/01/22  6:18 AM   Result Value Ref Range    Lactic Acid, Plasma 8.4 (HH) 0.4 - 2.0 MMOL/L   CBC    Collection Time: 06/01/22  6:18 AM   Result Value Ref Range    WBC 12.5 (H) 4.1 - 11.1 K/uL    RBC 4.17 4.10 - 5.70 M/uL    Hemoglobin 12.3 12.1 - 17.0 g/dL    Hematocrit 37.3 36.6 - 50.3 %    MCV 89.4 80.0 - 99.0 FL    MCH 29.5 26.0 - 34.0 PG    MCHC 33.0 30.0 - 36.5 g/dL    RDW 14.9 (H) 11.5 - 14.5 %    Platelets 46 (LL) 150 - 400 K/uL    Nucleated RBCs 0.0 0 PER 100 WBC    nRBC 0.00 0.00 - 0.01 K/uL       Radiologic Studies -   XR CHEST PORTABLE   Final Result   Right IJ catheter satisfactory position without pneumothorax. ET   tube and NG tube as above.          CT ABDOMEN PELVIS WO CONTRAST Additional Contrast? None   Final Result   Extraluminal air bubbles in the right upper quadrant concerning for gastric   perforation. Inflammatory changes are noted about the tail of the pancreas   extending into the left anterior pararenal space, please correlate with any   evidence of acute pancreatitis. Bilateral lower lobe infiltrates.      The findings were called to Dr. MSabino Donovanon 06/01/2022 at 1:05 AM by myself.    [place]  Final Result   No acute abnormality.            XR CHEST PORTABLE   Final Result      Bilateral lower lobe atelectasis versus infiltrates.           '@CT48'$ @  '@CXR48'$ @      Medical Decision Making   I am the first provider for this patient.    I reviewed the vital signs, available nursing notes, past medical history, past surgical history, family history and social history.    Vital Signs-Reviewed the patient's vital signs.  '@VITALS24'$ @      Provider Notes (Medical Decision Making):   76year old  presenting with nausea vomiting, fever, shortness of breath.  Had a temperature of 101 per EMS.  He is lethargic and ill-appearing.  Differential includes infection such as pneumonia, gastroenteritis, dehydration, electrolyte or metabolic abnormalities, renal dysfunction, UTI, other intra-abdominal infection or obstruction.  He is hypoxic at 88% on room air, requiring 2 L nasal cannula to maintain saturations greater than 90%.    Medications   sodium chloride flush  0.9 % injection 5-40 mL (has no administration in time range)   sodium chloride flush 0.9 % injection 5-40 mL (has no administration in time range)   0.9 % sodium chloride infusion (has no administration in time range)   acetaminophen (TYLENOL) tablet 650 mg (has no administration in time range)     Or   acetaminophen (TYLENOL) suppository 650 mg (has no administration in time range)   ondansetron (ZOFRAN) injection 4 mg (has no administration in time range)   insulin lispro (HUMALOG) injection vial 0-8 Units (has no administration in time range)   lactated ringers IV soln infusion ( IntraVENous New Bag 06/01/22 0509)   propofol infusion (25 mcg/kg/min  102 kg IntraVENous New Bag 06/01/22 0639)   phenylephrine (NEO-SYNEPHRINE) 50 mg in sodium chloride 0.9 % 250 mL infusion (175 mcg/min IntraVENous Rate/Dose Change 06/01/22 0650)   norepinephrine (LEVOPHED) 16 mg in sodium chloride 0.9 % 250 mL infusion (20 mcg/min IntraVENous New Bag 06/01/22 0640)   vasopressin 20 Units in sodium chloride 0.9 % 100 mL infusion (0.03 Units/min IntraVENous Rate/Dose Change 06/01/22 0640)   chlorhexidine (PERIDEX) 0.12 % solution 15 mL (has no administration in time range)   pantoprazole (PROTONIX) 40 mg in sodium chloride (PF) 0.9 % 10 mL injection (has no administration in time range)   hydrocortisone sodium succinate PF (SOLU-CORTEF) injection 50 mg (has no administration in time range)   anidulafungin (ERAXIS) 200 mg in sodium chloride 0.9 % 260 mL IVPB (has no  administration in time range)     Followed by   anidulafungin (ERAXIS) 100 mg in sodium chloride 0.9 % 130 mL IVPB (has no administration in time range)   phenylephrine (VAZCULEP) 10 MG/ML injection (has no administration in time range)   piperacillin-tazobactam (ZOSYN) 3,375 mg in sodium chloride 0.9 % 50 mL IVPB (mini-bag) (has no administration in time range)   anidulafungin (ERAXIS) 200 mg in sodium chloride 0.9 % 260 mL IVPB (has no administration in time range)   anidulafungin (ERAXIS) 100 mg in sodium chloride 0.9 % 130 mL IVPB (has no administration in time range)   lactated ringers bolus bolus 2,052 mL (0 mLs IntraVENous Stopped 06/01/22 0131)   piperacillin-tazobactam (ZOSYN) 4,500 mg in sodium chloride 0.9 % 100 mL IVPB (mini-bag) (0 mg IntraVENous Stopped 06/01/22 0053)   vancomycin (VANCOCIN) 2500 mg in sodium chloride 0.9 % 500 mL IVPB (2,500 mg IntraVENous New Bag 06/01/22 0122)      Patient is initially normotensive, then his blood pressure drops to 80s over 40s.  30 cc/kg ideal body weight fluid bolus on pressure bag.  Repeat blood pressure improved to 108/71.  He continues to be tachycardic and tachypneic.  Blood gas shows significant lactic acidosis of 10.32.  Broad-spectrum antibiotics as above.  CBC with lactic acidosis of 16.6.    ED Course as of 06/01/22 2505   Sun May 31, 2022   2357 [facility] ED SEPSIS NOTE:     11:57 PM EDT The patient now meets criteria for: Severe Sepsis    Fluid resuscitation with: 30 mL/kg crystalloid bolus using ideal weight because patient's BMI is 30 or greater  Due to concern for rapidly advancing infection and deterioration of patient's condition, antibiotics are started STAT and cultures ordered. [CM]   Mon Jun 01, 2022   0023 Chest x-ray is independently interpreted by myself as patchy haziness more in the right lower lobe concerning for infiltrate [CM]   0041 I reviewed his office visit from 06/11/2021, noted  to have history of type 2 diabetes, CKD, hypertension,  hyperlipidemia.  His medications include glimepiride, hydralazine, hydrochlorothiazide, losartan, metoprolol, atorvastatin. [CM]   0108 His blood pressure is low again, he will be initiated on vasopressors. [CM]   0108 Received call from radiology, CT scan is concerning for perforated viscus.  I spoke with Dr. Audria Nine of general surgery who will evaluate the patient. [CM]   26 Spoke with ICU provider, who accepts patient for admission [CM]   0111 I've performed a sepsis reassessment of the patient's clinical volume status and tissue perfusion at time 1:11 AM EDT  [CM]      ED Course User Index  [CM] Verdie Shire Min-Amiri Tritch, MD          Critical Care Time:     I have spent 90 minutes of critical care time in evaluating and treating this patient.  This includes time spent at bedside, time with family and decision makers, documentation, review of labs and imaging, and/or consultation with specialists.  It does not include time spent on separately billed procedures.     This patient presents with a critical illness or injury that acutely impairs one or more vital organ systems such that there is a high probability of imminent or life threatening deterioration in the patient's condition.  This case involved decision making of high complexity to assess, manipulate, and support vital organ system failure and/or to prevent further life threatening deterioration of the patient's condition. Failure to initiate these interventions on an urgent basis would likely result in sudden, clinically significant or life threatening deterioration in the patient's condition.    Abnormal findings supporting critical care: Altered mental status, perforated viscus, sepsis, shock, lactic acidosis, AKI, hypoxia, pneumonia  Interventions to support critical care: IV fluid, antibiotic, vasopressors, oxygen  Failure to intervene may result in: Shock, metabolic and renal decompensation, respiratory failure    Disposition:  Admit to ICU  [facility]  Esselman's  results have been reviewed with him.  He has been counseled regarding his diagnosis, treatment, and plan.  He verbally conveys understanding and agreement of the signs, symptoms, diagnosis, treatment and prognosis and additionally agrees to follow up as discussed.  He also agrees with the care-plan and conveys that all of his questions have been answered.  I have also provided discharge instructions for him that include: educational information regarding their diagnosis and treatment, and list of reasons why they would want to return to the ED prior to their follow-up appointment, should his condition change.     PLAN:  1.      Medication List        ASK your doctor about these medications      atorvastatin 40 MG tablet  Commonly known as: LIPITOR     hydrALAZINE 25 MG tablet  Commonly known as: APRESOLINE     losartan 100 MG tablet  Commonly known as: COZAAR     metFORMIN 500 MG tablet  Commonly known as: GLUCOPHAGE     metoprolol 100 MG tablet  Commonly known as: LOPRESSOR            2. No follow-up provider specified.  Return to ED if worse     Diagnosis     Clinical Impression: Acute septic shock secondary to perforated viscus, acute hypoxic respiratory failure with bilateral pneumonia, AKI, acute lactic acidosis, acute encephalopathy in setting of above               Sharlet Salina, MD  06/01/22 0724

## 2022-06-01 ENCOUNTER — Inpatient Hospital Stay
Admission: EM | Admit: 2022-06-01 | Discharge: 2022-08-03 | Disposition: A | Discharge status: Skilled Nursing Facility | Payer: MEDICARE | Admitting: Internal Medicine

## 2022-06-01 ENCOUNTER — Emergency Department: Admit: 2022-06-01 | Payer: MEDICARE

## 2022-06-01 ENCOUNTER — Inpatient Hospital Stay: Admit: 2022-06-01 | Payer: MEDICARE

## 2022-06-01 DIAGNOSIS — K255 Chronic or unspecified gastric ulcer with perforation: Secondary | ICD-10-CM

## 2022-06-01 LAB — CBC
Hematocrit: 37.6 % (ref 36.6–50.3)
Hematocrit: 37.3 % (ref 36.6–50.3)
Hematocrit: 41.5 % (ref 36.6–50.3)
Hemoglobin: 12.2 g/dL (ref 12.1–17.0)
Hemoglobin: 12.3 g/dL (ref 12.1–17.0)
Hemoglobin: 13.5 g/dL (ref 12.1–17.0)
MCH: 29.4 PG (ref 26.0–34.0)
MCH: 29.5 PG (ref 26.0–34.0)
MCH: 29.8 PG (ref 26.0–34.0)
MCHC: 32.4 g/dL (ref 30.0–36.5)
MCHC: 33 g/dL (ref 30.0–36.5)
MCHC: 32.5 g/dL (ref 30.0–36.5)
MCV: 90.6 FL (ref 80.0–99.0)
MCV: 89.4 FL (ref 80.0–99.0)
MCV: 91.6 FL (ref 80.0–99.0)
MPV: ABNORMAL FL (ref 8.9–12.9)
MPV: ABNORMAL FL (ref 8.9–12.9)
Nucleated RBCs: 0.1 PER 100 WBC — ABNORMAL HIGH
Nucleated RBCs: 0 PER 100 WBC
Nucleated RBCs: 0 PER 100 WBC
Platelets: 50 10*3/uL — ABNORMAL LOW (ref 150–400)
Platelets: 46 10*3/uL — CL (ref 150–400)
Platelets: 33 10*3/uL — CL (ref 150–400)
RBC: 4.15 M/uL (ref 4.10–5.70)
RBC: 4.17 M/uL (ref 4.10–5.70)
RBC: 4.53 M/uL (ref 4.10–5.70)
RDW: 14.8 % — ABNORMAL HIGH (ref 11.5–14.5)
RDW: 14.9 % — ABNORMAL HIGH (ref 11.5–14.5)
RDW: 15.1 % — ABNORMAL HIGH (ref 11.5–14.5)
WBC: 14.6 10*3/uL — ABNORMAL HIGH (ref 4.1–11.1)
WBC: 12.5 10*3/uL — ABNORMAL HIGH (ref 4.1–11.1)
WBC: 16.8 10*3/uL — ABNORMAL HIGH (ref 4.1–11.1)
nRBC: 0.02 10*3/uL — ABNORMAL HIGH (ref 0.00–0.01)
nRBC: 0 10*3/uL (ref 0.00–0.01)
nRBC: 0 10*3/uL (ref 0.00–0.01)

## 2022-06-01 LAB — ARTERIAL BLOOD GAS, POC
Base Deficit: 9.3 mmol/L
FIO2: 70 %
HCO3, Mixed: 18.3 MMOL/L — ABNORMAL LOW (ref 22–26)
POC PEEP: 5 cmH2O
POC TIDAL VOLUME: 500 ml
SO2c, Arterial, POC: 95.7 % (ref 92–97)
Set Rate: 16 {beats}/min
pCO2, Arterial, POC: 45.5 MMHG — ABNORMAL HIGH (ref 35.0–45.0)
pH, Arterial, POC: 7.21 — CL (ref 7.35–7.45)
pO2, Arterial, POC: 96 MMHG (ref 80–100)

## 2022-06-01 LAB — CULTURE, BLOOD, PCR ID PANEL RESULTS REPORT
Acinetobacter calcoac baumannii complex by PCR: NOT DETECTED
Bacteroides fragilis by PCR: NOT DETECTED
Candida albicans by PCR: NOT DETECTED
Candida auris by PCR: NOT DETECTED
Candida glabrata: NOT DETECTED
Candida krusei by PCR: NOT DETECTED
Candida parapsilosis by PCR: NOT DETECTED
Candida tropicalis by PCR: NOT DETECTED
Colistin Resistance mcr-1 gene by PCR: NOT DETECTED
Cryptococcus neoformans/gattii by PCR: NOT DETECTED
Enterobacter cloacae complex by PCR: NOT DETECTED
Enterobacteriaceae by PCR: DETECTED — AB
Enterococcus faecalis by PCR: NOT DETECTED
Enterococcus faecium by PCR: NOT DETECTED
Escherichia Coli: DETECTED — AB
Haemophilus Influenzae by PCR: NOT DETECTED
KPC (Carbapenem resistance gene): NOT DETECTED
Klebsiella aerogenes by PCR: NOT DETECTED
Klebsiella oxytoca by PCR: NOT DETECTED
Klebsiella pneumoniae group by PCR: NOT DETECTED
Listeria monocytogenes by PCR: NOT DETECTED
Neisseria meningitidis by PCR: NOT DETECTED
Proteus by PCR: NOT DETECTED
Pseudomonas aeruginosa: NOT DETECTED
Resistant gene ctx-m by PCR: NOT DETECTED
Resistant gene imp by PCR: NOT DETECTED
Resistant gene ndm by PCR: NOT DETECTED
Resistant gene oxa-48-like by pcr: NOT DETECTED
Resistant gene vim by PCR: NOT DETECTED
STAPHYLOCOCCUS: NOT DETECTED
STREPTOCOCCUS: NOT DETECTED
Salmonella species by PCR: NOT DETECTED
Serratia marcescens by PCR: NOT DETECTED
Staphylococcus Aureus: NOT DETECTED
Staphylococcus epidermidis by PCR: NOT DETECTED
Staphylococcus lugdunensis by PCR: NOT DETECTED
Stenotrophomonas maltophilia by PCR: NOT DETECTED
Strep pneumoniae: NOT DETECTED
Strep pyogenes,(Grp. A): NOT DETECTED
Streptococcus agalactiae (Group B): NOT DETECTED

## 2022-06-01 LAB — HEPATIC FUNCTION PANEL
ALT: 930 U/L — ABNORMAL HIGH (ref 12–78)
AST: 1728 U/L — ABNORMAL HIGH (ref 15–37)
Albumin/Globulin Ratio: 1 — ABNORMAL LOW (ref 1.1–2.2)
Albumin: 2.5 g/dL — ABNORMAL LOW (ref 3.5–5.0)
Alk Phosphatase: 122 U/L — ABNORMAL HIGH (ref 45–117)
Bilirubin, Direct: 4 MG/DL — ABNORMAL HIGH (ref 0.0–0.2)
Globulin: 2.4 g/dL (ref 2.0–4.0)
Total Bilirubin: 5.1 MG/DL — ABNORMAL HIGH (ref 0.2–1.0)
Total Protein: 4.9 g/dL — ABNORMAL LOW (ref 6.4–8.2)

## 2022-06-01 LAB — BASIC METABOLIC PANEL
Anion Gap: 18 mmol/L — ABNORMAL HIGH (ref 5–15)
Anion Gap: 14 mmol/L (ref 5–15)
BUN: 64 MG/DL — ABNORMAL HIGH (ref 6–20)
BUN: 64 MG/DL — ABNORMAL HIGH (ref 6–20)
Bun/Cre Ratio: 16 (ref 12–20)
Bun/Cre Ratio: 17 (ref 12–20)
CO2: 15 mmol/L — CL (ref 21–32)
CO2: 18 mmol/L — ABNORMAL LOW (ref 21–32)
Calcium: 8.3 MG/DL — ABNORMAL LOW (ref 8.5–10.1)
Calcium: 9.1 MG/DL (ref 8.5–10.1)
Chloride: 106 mmol/L (ref 97–108)
Chloride: 110 mmol/L — ABNORMAL HIGH (ref 97–108)
Creatinine: 3.9 MG/DL — ABNORMAL HIGH (ref 0.70–1.30)
Creatinine: 3.71 MG/DL — ABNORMAL HIGH (ref 0.70–1.30)
Est, Glom Filt Rate: 15 mL/min/{1.73_m2} — ABNORMAL LOW (ref >60)
Est, Glom Filt Rate: 16 mL/min/{1.73_m2} — ABNORMAL LOW (ref >60)
Glucose: 233 mg/dL — ABNORMAL HIGH (ref 65–100)
Glucose: 196 mg/dL — ABNORMAL HIGH (ref 65–100)
Potassium: 3.9 mmol/L (ref 3.5–5.1)
Potassium: 4.8 mmol/L (ref 3.5–5.1)
Sodium: 139 mmol/L (ref 136–145)
Sodium: 142 mmol/L (ref 136–145)

## 2022-06-01 LAB — LACTIC ACID
Lactic Acid, Plasma: 8.4 MMOL/L (ref 0.4–2.0)
Lactic Acid, Plasma: 8.4 MMOL/L (ref 0.4–2.0)
Lactic Acid, Plasma: 8.3 MMOL/L (ref 0.4–2.0)

## 2022-06-01 LAB — PROTIME-INR
INR: 1.4 — ABNORMAL HIGH (ref 0.9–1.1)
Protime: 14.6 s — ABNORMAL HIGH (ref 9.0–11.1)

## 2022-06-01 LAB — POCT BLOOD GAS & ELECTROLYTES
Anion Gap, POC: 17 (ref 10–20)
BASE DEFICIT (POC): 8.1 mmol/L
HCO3, Arterial: 15 mmol/L
PCO2, Venus, POC: 26.6 MMHG — ABNORMAL LOW (ref 41–51)
PH, VENOUS (POC): 7.37 (ref 7.32–7.42)
PO2, VENOUS (POC): 46 mmHg — ABNORMAL HIGH (ref 25–40)
POC Chloride: 109 MMOL/L — ABNORMAL HIGH (ref 100–108)
POC Creatinine: 4 MG/DL — ABNORMAL HIGH (ref 0.6–1.3)
POC Glucose: 271 MG/DL — ABNORMAL HIGH (ref 74–106)
POC Ionized Calcium: 1.09 mmol/L — ABNORMAL LOW (ref 1.12–1.32)
POC Lactic Acid: 10.32 mmol/L (ref 0.40–2.00)
POC O2 SAT: 82 %
POC Potassium: 3.8 MMOL/L (ref 3.5–5.5)
POC Sodium: 141 MMOL/L (ref 136–145)
POC TCO2: 15 MMOL/L — ABNORMAL LOW (ref 19–24)
eGFR, POC: 15 mL/min/{1.73_m2} — ABNORMAL LOW (ref >60)

## 2022-06-01 LAB — BLOOD GAS, ARTERIAL
Base deficit, arterial blood: 8.4 mmol/L
Base deficit, arterial blood: 8.6 mmol/L
Base deficit, arterial blood: 9 mmol/L
Base deficit, arterial blood: 7.4 mmol/L
Base deficit, arterial blood: 10.1 mmol/L
FIO2 Arterial: 75 %
FIO2 Arterial: 60 %
FIO2 Arterial: 55 %
FIO2 Arterial: 45 %
HCO3, Arterial: 17 mmol/L — ABNORMAL LOW (ref 22–26)
HCO3, Arterial: 17 mmol/L — ABNORMAL LOW (ref 22–26)
HCO3, Arterial: 16 mmol/L — ABNORMAL LOW (ref 22–26)
HCO3, Arterial: 18 mmol/L — ABNORMAL LOW (ref 22–26)
HCO3, Arterial: 16 mmol/L — ABNORMAL LOW (ref 22–26)
POC O2 SAT: 98 % — ABNORMAL HIGH (ref 92–97)
POC O2 SAT: 96 % (ref 92–97)
POC O2 SAT: 95 % (ref 92–97)
POC O2 SAT: 94 % (ref 92–97)
POC O2 SAT: 73 % — ABNORMAL LOW (ref 92–97)
POC PEEP/CPA: 5
POC PEEP/CPA: 5
POC PEEP/CPA: 7
POC PEEP/CPA: 5
POC TIDAL VOLUME: 525
POC TIDAL VOLUME: 500
POC TIDAL VOLUME: 500
Set Rate, POC: 18
Set Rate, POC: 20
Set Rate, POC: 20
pCO2, Arterial: 36 mmHg (ref 35–45)
pCO2, Arterial: 35 mmHg (ref 35–45)
pCO2, Arterial: 34 mmHg — ABNORMAL LOW (ref 35–45)
pCO2, Arterial: 37 mmHg (ref 35–45)
pCO2, Arterial: 38 mmHg (ref 35–45)
pH, Arterial: 7.3 — ABNORMAL LOW (ref 7.35–7.45)
pH, Arterial: 7.3 — ABNORMAL LOW (ref 7.35–7.45)
pH, Arterial: 7.3 — ABNORMAL LOW (ref 7.35–7.45)
pH, Arterial: 7.31 — ABNORMAL LOW (ref 7.35–7.45)
pH, Arterial: 7.25 — ABNORMAL LOW (ref 7.35–7.45)
pO2, Arterial: 112 mmHg — ABNORMAL HIGH (ref 80–100)
pO2, Arterial: 87 mmHg (ref 80–100)
pO2, Arterial: 82 mmHg (ref 80–100)
pO2, Arterial: 75 mmHg — ABNORMAL LOW (ref 80–100)
pO2, Arterial: 44 mmHg — CL (ref 80–100)

## 2022-06-01 LAB — COMPREHENSIVE METABOLIC PANEL
ALT: 658 U/L — ABNORMAL HIGH (ref 12–78)
AST: 812 U/L — ABNORMAL HIGH (ref 15–37)
Albumin/Globulin Ratio: 0.9 — ABNORMAL LOW (ref 1.1–2.2)
Albumin: 3.2 g/dL — ABNORMAL LOW (ref 3.5–5.0)
Alk Phosphatase: 162 U/L — ABNORMAL HIGH (ref 45–117)
Anion Gap: 17 mmol/L — ABNORMAL HIGH (ref 5–15)
BUN: 66 MG/DL — ABNORMAL HIGH (ref 6–20)
Bun/Cre Ratio: 16 (ref 12–20)
CO2: 19 mmol/L — ABNORMAL LOW (ref 21–32)
Calcium: 9.1 MG/DL (ref 8.5–10.1)
Chloride: 103 mmol/L (ref 97–108)
Creatinine: 4.06 MG/DL — ABNORMAL HIGH (ref 0.70–1.30)
Est, Glom Filt Rate: 15 mL/min/{1.73_m2} — ABNORMAL LOW (ref >60)
Globulin: 3.7 g/dL (ref 2.0–4.0)
Glucose: 272 mg/dL — ABNORMAL HIGH (ref 65–100)
Potassium: 3.9 mmol/L (ref 3.5–5.1)
Sodium: 139 mmol/L (ref 136–145)
Total Bilirubin: 4.8 MG/DL — ABNORMAL HIGH (ref 0.2–1.0)
Total Protein: 6.9 g/dL (ref 6.4–8.2)

## 2022-06-01 LAB — TYPE AND SCREEN
ABO/Rh: A POS
Antibody Screen: NEGATIVE

## 2022-06-01 LAB — COVID-19, RAPID: SARS-CoV-2, Rapid: NOT DETECTED

## 2022-06-01 LAB — BRAIN NATRIURETIC PEPTIDE: NT Pro-BNP: 20980 PG/ML — ABNORMAL HIGH (ref <450)

## 2022-06-01 LAB — MAGNESIUM
Magnesium: 1.7 mg/dL (ref 1.6–2.4)
Magnesium: 1.5 mg/dL — ABNORMAL LOW (ref 1.6–2.4)
Magnesium: 1.5 mg/dL — ABNORMAL LOW (ref 1.6–2.4)

## 2022-06-01 LAB — POCT GLUCOSE
POC Glucose: 178 mg/dL — ABNORMAL HIGH (ref 65–100)
POC Glucose: 161 mg/dL — ABNORMAL HIGH (ref 65–100)
POC Glucose: 184 mg/dL — ABNORMAL HIGH (ref 65–100)

## 2022-06-01 LAB — PROCALCITONIN: Procalcitonin: 105.82 ng/mL

## 2022-06-01 LAB — LIPASE: Lipase: >3000 U/L — ABNORMAL HIGH (ref 73–393)

## 2022-06-01 LAB — PHOSPHORUS
Phosphorus: 3.6 MG/DL (ref 2.6–4.7)
Phosphorus: 6.6 MG/DL — ABNORMAL HIGH (ref 2.6–4.7)

## 2022-06-01 LAB — ETHANOL: Ethanol Lvl: <10 MG/DL (ref <10)

## 2022-06-01 LAB — CK
Total CK: 1290 U/L — ABNORMAL HIGH (ref 39–308)
Total CK: 1876 U/L — ABNORMAL HIGH (ref 39–308)

## 2022-06-01 LAB — D-DIMER, QUANTITATIVE: D-Dimer, Quant: >35.2 mg/L FEU — ABNORMAL HIGH (ref 0.00–0.65)

## 2022-06-01 LAB — TROPONIN: Troponin, High Sensitivity: 77 ng/L — ABNORMAL HIGH (ref 0–76)

## 2022-06-01 MED ORDER — ONDANSETRON HCL 4 MG/2ML IJ SOLN
4 MG/2ML | INTRAMUSCULAR | Status: DC | PRN
Start: 2022-06-01 — End: 2022-06-01
  Administered 2022-06-01: 09:00:00 4 via INTRAVENOUS

## 2022-06-01 MED ORDER — ONDANSETRON HCL 4 MG/2ML IJ SOLN
4 MG/2ML | Freq: Once | INTRAMUSCULAR | Status: DC | PRN
Start: 2022-06-01 — End: 2022-06-01

## 2022-06-01 MED ORDER — VANCOMYCIN 2500 MG IN NS 500 ML (PREMIX) IVPB
Freq: Once | Status: AC
Start: 2022-06-01 — End: 2022-06-01
  Administered 2022-06-01: 05:00:00 2500 mg/kg via INTRAVENOUS

## 2022-06-01 MED ORDER — VASOPRESSIN 20 UNIT/ML SOLN (MIXTURES ONLY)
20 UNIT/ML | INTRAVENOUS | Status: DC | PRN
Start: 2022-06-01 — End: 2022-06-01
  Administered 2022-06-01: 08:00:00 .01 via INTRAVENOUS

## 2022-06-01 MED ORDER — DEXAMETHASONE 4 MG/ML IJ SOLN (MIXTURES ONLY)
4 MG/ML | INTRAMUSCULAR | Status: DC | PRN
Start: 2022-06-01 — End: 2022-06-01
  Administered 2022-06-01: 07:00:00 4 via INTRAVENOUS

## 2022-06-01 MED ORDER — NOREPINEPHRINE-SODIUM CHLORIDE 16-0.9 MG/250ML-% IV SOLN
INTRAVENOUS | Status: DC
Start: 2022-06-01 — End: 2022-06-01
  Administered 2022-06-01: 06:00:00 5 ug/min via INTRAVENOUS
  Administered 2022-06-01: 06:00:00 7 ug/min via INTRAVENOUS

## 2022-06-01 MED ORDER — SODIUM BICARBONATE 8.4 % IV SOLN
8.4 % | INTRAVENOUS | Status: AC
Start: 2022-06-01 — End: ?

## 2022-06-01 MED ORDER — MIDAZOLAM HCL 2 MG/2ML IJ SOLN
2 MG/ML | INTRAMUSCULAR | Status: DC | PRN
Start: 2022-06-01 — End: 2022-06-01
  Administered 2022-06-01: 07:00:00 2 via INTRAVENOUS

## 2022-06-01 MED ORDER — PANTOPRAZOLE SODIUM 40 MG IV SOLR
40 | Freq: Two times a day (BID) | INTRAVENOUS | Status: DC
Start: 2022-06-01 — End: 2022-06-08
  Administered 2022-06-01 – 2022-06-08 (×15): 40 mg via INTRAVENOUS

## 2022-06-01 MED ORDER — ANIDULAFUNGIN 100 MG IV SOLR
100 MG | Freq: Once | INTRAVENOUS | Status: AC
Start: 2022-06-01 — End: 2022-06-01
  Administered 2022-06-01: 12:00:00 200 mg via INTRAVENOUS

## 2022-06-01 MED ORDER — SODIUM CHLORIDE 0.9 % IV SOLN
0.9 | INTRAVENOUS | Status: DC
Start: 2022-06-01 — End: 2022-06-05
  Administered 2022-06-01 – 2022-06-02 (×4): 125 ug/min via INTRAVENOUS
  Administered 2022-06-03: 07:00:00 35 ug/min via INTRAVENOUS

## 2022-06-01 MED ORDER — FENTANYL CITRATE (PF) 100 MCG/2ML IJ SOLN
100 MCG/2ML | INTRAMUSCULAR | Status: DC | PRN
Start: 2022-06-01 — End: 2022-06-01
  Administered 2022-06-01 (×2): 50 via INTRAVENOUS

## 2022-06-01 MED ORDER — PROPOFOL 1000 MG/100ML IV EMUL
1000 | INTRAVENOUS | Status: DC
Start: 2022-06-01 — End: 2022-06-02
  Administered 2022-06-01: 11:00:00 25 ug/kg/min via INTRAVENOUS
  Administered 2022-06-01 – 2022-06-02 (×3): 20 ug/kg/min via INTRAVENOUS

## 2022-06-01 MED ORDER — HYDROCORTISONE SOD SUC (PF) 100 MG IJ SOLR (ACT-0-VIAL)
100 | Freq: Four times a day (QID) | INTRAMUSCULAR | Status: DC
Start: 2022-06-01 — End: 2022-06-04
  Administered 2022-06-01 – 2022-06-04 (×13): 50 mg via INTRAVENOUS

## 2022-06-01 MED ORDER — HYDROMORPHONE HCL PF 1 MG/ML IJ SOLN
1 MG/ML | INTRAMUSCULAR | Status: DC | PRN
Start: 2022-06-01 — End: 2022-06-01

## 2022-06-01 MED ORDER — NORMAL SALINE FLUSH 0.9 % IV SOLN
0.9 % | Freq: Two times a day (BID) | INTRAVENOUS | Status: DC
Start: 2022-06-01 — End: 2022-06-01

## 2022-06-01 MED ORDER — CALCIUM CHLORIDE 10 % IV SOLN
10 % | INTRAVENOUS | Status: DC | PRN
Start: 2022-06-01 — End: 2022-06-01
  Administered 2022-06-01 (×4): .5 via INTRAVENOUS

## 2022-06-01 MED ORDER — ROCURONIUM BROMIDE 50 MG/5ML IV SOLN
50 MG/5ML | INTRAVENOUS | Status: AC
Start: 2022-06-01 — End: ?

## 2022-06-01 MED ORDER — DESMOPRESSIN ACETATE 4 MCG/ML IJ SOLN (MIXTURES ONLY)
4 MCG/ML | Freq: Once | INTRAMUSCULAR | Status: AC
Start: 2022-06-01 — End: 2022-06-01
  Administered 2022-06-01: 23:00:00 32 ug via INTRAVENOUS

## 2022-06-01 MED ORDER — BUPIVACAINE-EPINEPHRINE 0.5% -1:200000 IJ SOLN (MIXTURES ONLY)
INTRAMUSCULAR | Status: DC | PRN
Start: 2022-06-01 — End: 2022-06-01
  Administered 2022-06-01: 09:00:00 6 via SUBCUTANEOUS

## 2022-06-01 MED ORDER — MEROPENEM 1 G IV SOLR
1 | Freq: Two times a day (BID) | INTRAVENOUS | Status: DC
Start: 2022-06-01 — End: 2022-06-03
  Administered 2022-06-02 – 2022-06-03 (×3): 1000 mg via INTRAVENOUS

## 2022-06-01 MED ORDER — PROPOFOL 1000 MG/100ML IV EMUL
1000 MG/100ML | INTRAVENOUS | Status: DC | PRN
Start: 2022-06-01 — End: 2022-06-01
  Administered 2022-06-01: 09:00:00 25 via INTRAVENOUS

## 2022-06-01 MED ORDER — NORMAL SALINE FLUSH 0.9 % IV SOLN
0.9 % | INTRAVENOUS | Status: AC | PRN
Start: 2022-06-01 — End: 2022-08-03

## 2022-06-01 MED ORDER — LACTATED RINGERS IV BOLUS
Freq: Once | INTRAVENOUS | Status: AC
Start: 2022-06-01 — End: 2022-06-01
  Administered 2022-06-01: 04:00:00 2052 mL/kg via INTRAVENOUS

## 2022-06-01 MED ORDER — SODIUM CHLORIDE 0.9 % IV SOLN
0.9 | INTRAVENOUS | Status: DC
Start: 2022-06-01 — End: 2022-06-01

## 2022-06-01 MED ORDER — MIDAZOLAM HCL 2 MG/2ML IJ SOLN
2 MG/ML | INTRAMUSCULAR | Status: AC
Start: 2022-06-01 — End: ?

## 2022-06-01 MED ORDER — CEFEPIME HCL 2 G IV SOLR
2 g | Freq: Three times a day (TID) | INTRAVENOUS | Status: DC
Start: 2022-06-01 — End: 2022-06-01

## 2022-06-01 MED ORDER — PHENYLEPHRINE HCL 10 MG/ML SOLN (MIXTURES ONLY)
10 MG/ML | INTRAVENOUS | Status: DC
Start: 2022-06-01 — End: 2022-06-01
  Administered 2022-06-01: 16:00:00 150 ug/min via INTRAVENOUS

## 2022-06-01 MED ORDER — VANCOMYCIN INTERMITTENT DOSING (PLACEHOLDER)
INTRAVENOUS | Status: DC
Start: 2022-06-01 — End: 2022-06-02

## 2022-06-01 MED ORDER — CEFEPIME HCL 2 G IV SOLR
2 g | Freq: Two times a day (BID) | INTRAVENOUS | Status: AC
Start: 2022-06-01 — End: 2022-06-01
  Administered 2022-06-01: 14:00:00 2000 mg via INTRAVENOUS

## 2022-06-01 MED ORDER — SODIUM CHLORIDE 0.9 % IV SOLN
0.9 % | INTRAVENOUS | Status: DC | PRN
Start: 2022-06-01 — End: 2022-08-03

## 2022-06-01 MED ORDER — PHENYLEPHRINE HCL 10 MG/ML SOLN (MIXTURES ONLY)
10 MG/ML | INTRAVENOUS | Status: DC | PRN
Start: 2022-06-01 — End: 2022-06-01
  Administered 2022-06-01: 07:00:00 40 via INTRAVENOUS

## 2022-06-01 MED ORDER — PROPOFOL 1000 MG/100ML IV EMUL
1000 MG/100ML | INTRAVENOUS | Status: AC
Start: 2022-06-01 — End: ?

## 2022-06-01 MED ORDER — ONDANSETRON HCL 4 MG/2ML IJ SOLN
4 MG/2ML | Freq: Four times a day (QID) | INTRAMUSCULAR | Status: AC | PRN
Start: 2022-06-01 — End: 2022-08-03
  Administered 2022-07-05: 14:00:00 4 mg via INTRAVENOUS

## 2022-06-01 MED ORDER — PHENYLEPHRINE HCL (PRESSORS) 10 MG/ML IV SOLN
10 MG/ML | INTRAVENOUS | Status: AC
Start: 2022-06-01 — End: 2022-06-01

## 2022-06-01 MED ORDER — PIPERACILLIN SOD-TAZOBACTAM SO 4.5 (4-0.5) G IV SOLR
4.5 (4-0.5) g | Freq: Once | INTRAVENOUS | Status: AC
Start: 2022-06-01 — End: 2022-06-01
  Administered 2022-06-01: 04:00:00 4500 mg via INTRAVENOUS

## 2022-06-01 MED ORDER — SODIUM CHLORIDE 0.9 % IV SOLN
0.9 % | INTRAVENOUS | Status: DC
Start: 2022-06-01 — End: 2022-06-01

## 2022-06-01 MED ORDER — ANIDULAFUNGIN 100 MG IV SOLR
100 MG | INTRAVENOUS | Status: AC
Start: 2022-06-01 — End: 2022-06-05
  Administered 2022-06-02 – 2022-06-05 (×4): 100 mg via INTRAVENOUS

## 2022-06-01 MED ORDER — PROCHLORPERAZINE EDISYLATE 10 MG/2ML IJ SOLN
10 MG/2ML | Freq: Once | INTRAMUSCULAR | Status: DC | PRN
Start: 2022-06-01 — End: 2022-06-01

## 2022-06-01 MED ORDER — NOREPINEPHRINE-SODIUM CHLORIDE 16-0.9 MG/250ML-% IV SOLN
16-0.9 | INTRAVENOUS | Status: DC
Start: 2022-06-01 — End: 2022-06-02
  Administered 2022-06-01 (×2): 20 ug/min via INTRAVENOUS
  Administered 2022-06-02: 13:00:00 25 ug/min via INTRAVENOUS

## 2022-06-01 MED ORDER — LACTATED RINGERS IV SOLN
INTRAVENOUS | Status: DC
Start: 2022-06-01 — End: 2022-06-01
  Administered 2022-06-01 (×2): via INTRAVENOUS

## 2022-06-01 MED ORDER — ACETAMINOPHEN 650 MG RE SUPP
650 | Freq: Four times a day (QID) | RECTAL | Status: DC | PRN
Start: 2022-06-01 — End: 2022-08-03
  Administered 2022-06-01: 17:00:00 650 mg via RECTAL

## 2022-06-01 MED ORDER — SODIUM CHLORIDE 0.9 % IV SOLN (MINI-BAG)
0.9 % | Freq: Four times a day (QID) | INTRAVENOUS | Status: DC
Start: 2022-06-01 — End: 2022-06-01

## 2022-06-01 MED ORDER — HYDROMORPHONE HCL 2 MG/ML IJ SOLN
2 MG/ML | INTRAMUSCULAR | Status: AC
Start: 2022-06-01 — End: ?

## 2022-06-01 MED ORDER — ETOMIDATE 2 MG/ML IV SOLN
2 MG/ML | INTRAVENOUS | Status: DC | PRN
Start: 2022-06-01 — End: 2022-06-01
  Administered 2022-06-01: 07:00:00 24 via INTRAVENOUS

## 2022-06-01 MED ORDER — INSULIN LISPRO 100 UNIT/ML IJ SOLN
100 UNIT/ML | Freq: Four times a day (QID) | INTRAMUSCULAR | Status: AC
Start: 2022-06-01 — End: 2022-06-02
  Administered 2022-06-02: 13:00:00 2 [IU] via SUBCUTANEOUS

## 2022-06-01 MED ORDER — SODIUM CHLORIDE 0.9 % IV SOLN
0.9 | Freq: Two times a day (BID) | INTRAVENOUS | Status: AC
Start: 2022-06-01 — End: 2022-06-04
  Administered 2022-06-02 – 2022-06-04 (×6): 200 mg via INTRAVENOUS

## 2022-06-01 MED ORDER — SODIUM BICARBONATE 8.4 % IV SOLN
8.4 % | INTRAVENOUS | Status: DC | PRN
Start: 2022-06-01 — End: 2022-06-01
  Administered 2022-06-01 (×3): 50 via INTRAVENOUS

## 2022-06-01 MED ORDER — GLUCAGON HCL RDNA (DIAGNOSTIC) 1 MG IJ SOLR
1 MG | INTRAMUSCULAR | Status: DC | PRN
Start: 2022-06-01 — End: 2022-06-01

## 2022-06-01 MED ORDER — NORMAL SALINE FLUSH 0.9 % IV SOLN
0.9 % | Freq: Two times a day (BID) | INTRAVENOUS | Status: AC
Start: 2022-06-01 — End: 2022-08-03
  Administered 2022-06-01 – 2022-06-02 (×3): 10 mL via INTRAVENOUS
  Administered 2022-06-03: 14:00:00 40 mL via INTRAVENOUS
  Administered 2022-06-03 – 2022-08-03 (×108): 10 mL via INTRAVENOUS

## 2022-06-01 MED ORDER — SODIUM CHLORIDE 0.9 % IV SOLN
0.9 | Freq: Two times a day (BID) | INTRAVENOUS | Status: DC
Start: 2022-06-01 — End: 2022-06-02

## 2022-06-01 MED ORDER — SUCCINYLCHOLINE CHLORIDE 20 MG/ML IJ SOLN
20 MG/ML | INTRAMUSCULAR | Status: DC | PRN
Start: 2022-06-01 — End: 2022-06-01
  Administered 2022-06-01: 07:00:00 160 via INTRAVENOUS

## 2022-06-01 MED ORDER — DEXTROSE 10 % IV BOLUS
INTRAVENOUS | Status: DC | PRN
Start: 2022-06-01 — End: 2022-06-01

## 2022-06-01 MED ORDER — PRISMASOL BGK 2/3.5 DIALYSIS SOLUTION
32-2-3.5 | Status: DC
Start: 2022-06-01 — End: 2022-06-05
  Administered 2022-06-02 – 2022-06-05 (×26): via INTRAVENOUS_CENTRAL

## 2022-06-01 MED ORDER — NORMAL SALINE FLUSH 0.9 % IV SOLN
0.9 % | INTRAVENOUS | Status: DC | PRN
Start: 2022-06-01 — End: 2022-06-01

## 2022-06-01 MED ORDER — SODIUM CHLORIDE 0.9 % IV SOLN
0.9 % | INTRAVENOUS | Status: DC | PRN
Start: 2022-06-01 — End: 2022-06-01
  Administered 2022-06-01 (×2): via INTRAVENOUS

## 2022-06-01 MED ORDER — CHLORHEXIDINE GLUCONATE 0.12 % MT SOLN
0.12 | Freq: Two times a day (BID) | OROMUCOSAL | Status: DC
Start: 2022-06-01 — End: 2022-06-09
  Administered 2022-06-01 – 2022-06-09 (×17): 15 mL via OROMUCOSAL

## 2022-06-01 MED ORDER — BUPIVACAINE-EPINEPHRINE (PF) 0.5% -1:200000 IJ SOLN
INTRAMUSCULAR | Status: AC
Start: 2022-06-01 — End: ?

## 2022-06-01 MED ORDER — DEXTROSE 5 % IV SOLN
5 | INTRAVENOUS | Status: DC
Start: 2022-06-01 — End: 2022-06-02
  Administered 2022-06-01 – 2022-06-02 (×3): via INTRAVENOUS

## 2022-06-01 MED ORDER — FENTANYL CITRATE-NACL 1-0.9 MG/100ML-% IV SOLN
INTRAVENOUS | Status: AC
Start: 2022-06-01 — End: 2022-06-05
  Administered 2022-06-01 – 2022-06-02 (×2): 50 ug/h via INTRAVENOUS
  Administered 2022-06-02: 19:00:00 75 ug/h via INTRAVENOUS
  Administered 2022-06-03 (×3): 100 ug/h via INTRAVENOUS
  Administered 2022-06-04 – 2022-06-05 (×2): 50 ug/h via INTRAVENOUS

## 2022-06-01 MED ORDER — SODIUM CHLORIDE 0.9 % IV SOLN
0.9 % | INTRAVENOUS | Status: DC | PRN
Start: 2022-06-01 — End: 2022-06-01

## 2022-06-01 MED ORDER — ROCURONIUM BROMIDE 50 MG/5ML IV SOLN
50 MG/5ML | INTRAVENOUS | Status: DC | PRN
Start: 2022-06-01 — End: 2022-06-01
  Administered 2022-06-01: 07:00:00 10 via INTRAVENOUS
  Administered 2022-06-01: 08:00:00 50 via INTRAVENOUS
  Administered 2022-06-01: 07:00:00 40 via INTRAVENOUS
  Administered 2022-06-01: 09:00:00 50 via INTRAVENOUS

## 2022-06-01 MED ORDER — ETOMIDATE 2 MG/ML IV SOLN
2 MG/ML | INTRAVENOUS | Status: AC
Start: 2022-06-01 — End: ?

## 2022-06-01 MED ORDER — SODIUM CHLORIDE 0.9 % IV SOLN
0.9 | INTRAVENOUS | Status: DC
Start: 2022-06-01 — End: 2022-06-02
  Administered 2022-06-01 – 2022-06-02 (×3): 0.03 [IU]/min via INTRAVENOUS

## 2022-06-01 MED ORDER — HYDROMORPHONE HCL 2 MG/ML IJ SOLN
2 MG/ML | INTRAMUSCULAR | Status: DC | PRN
Start: 2022-06-01 — End: 2022-06-01
  Administered 2022-06-01 (×2): .2 via INTRAVENOUS

## 2022-06-01 MED ORDER — GLUCOSE 4 G PO CHEW
4 g | ORAL | Status: DC | PRN
Start: 2022-06-01 — End: 2022-06-01

## 2022-06-01 MED ORDER — FENTANYL CITRATE (PF) 100 MCG/2ML IJ SOLN
100 MCG/2ML | INTRAMUSCULAR | Status: AC
Start: 2022-06-01 — End: ?

## 2022-06-01 MED ORDER — PHENYLEPHRINE HCL (PRESSORS) 10 MG/ML IV SOLN
10 MG/ML | INTRAVENOUS | Status: AC
Start: 2022-06-01 — End: ?

## 2022-06-01 MED ORDER — FENTANYL CITRATE (PF) 100 MCG/2ML IJ SOLN
100 MCG/2ML | INTRAMUSCULAR | Status: DC | PRN
Start: 2022-06-01 — End: 2022-06-01

## 2022-06-01 MED ORDER — ACETAMINOPHEN 325 MG PO TABS
325 | Freq: Four times a day (QID) | ORAL | Status: DC | PRN
Start: 2022-06-01 — End: 2022-08-03
  Administered 2022-06-15 – 2022-07-28 (×9): 650 mg via ORAL

## 2022-06-01 MED ORDER — METRONIDAZOLE 500 MG/100ML IV SOLN
500 MG/100ML | Freq: Three times a day (TID) | INTRAVENOUS | Status: DC
Start: 2022-06-01 — End: 2022-06-01
  Administered 2022-06-01: 12:00:00 500 mg via INTRAVENOUS

## 2022-06-01 MED ORDER — DEXTROSE 10 % IV SOLN
10 % | INTRAVENOUS | Status: DC | PRN
Start: 2022-06-01 — End: 2022-06-01

## 2022-06-01 MED ORDER — SODIUM CHLORIDE 0.9 % IV SOLN
0.9 % | Freq: Once | INTRAVENOUS | Status: DC
Start: 2022-06-01 — End: 2022-06-01

## 2022-06-01 MED ORDER — LIDOCAINE HCL 2 % IJ SOLN (MIXTURES ONLY)
2 % | INTRAMUSCULAR | Status: DC | PRN
Start: 2022-06-01 — End: 2022-06-01
  Administered 2022-06-01: 07:00:00 100 via INTRAVENOUS

## 2022-06-01 MED FILL — PANTOPRAZOLE SODIUM 40 MG IV SOLR: 40 MG | INTRAVENOUS | Qty: 40

## 2022-06-01 MED FILL — SODIUM BICARBONATE 8.4 % IV SOLN: 8.4 % | INTRAVENOUS | Qty: 150

## 2022-06-01 MED FILL — SOLU-CORTEF 100 MG IJ SOLR: 100 MG | INTRAMUSCULAR | Qty: 2

## 2022-06-01 MED FILL — METRONIDAZOLE 500 MG/100ML IV SOLN: 500 MG/100ML | INTRAVENOUS | Qty: 100

## 2022-06-01 MED FILL — AMIDATE 2 MG/ML IV SOLN: 2 MG/ML | INTRAVENOUS | Qty: 40

## 2022-06-01 MED FILL — PHENYLEPHRINE HCL (PRESSORS) 10 MG/ML IV SOLN: 10 MG/ML | INTRAVENOUS | Qty: 5

## 2022-06-01 MED FILL — VASOSTRICT 20 UNIT/ML IV SOLN: 20 UNIT/ML | INTRAVENOUS | Qty: 1

## 2022-06-01 MED FILL — ACETAMINOPHEN 650 MG RE SUPP: 650 MG | RECTAL | Qty: 1

## 2022-06-01 MED FILL — PRISMASOL BGK 2/3.5 32-2-3.5 MEQ/L EC SOLN: Qty: 5000

## 2022-06-01 MED FILL — FENTANYL CITRATE-NACL 1-0.9 MG/100ML-% IV SOLN: INTRAVENOUS | Qty: 100

## 2022-06-01 MED FILL — DIPRIVAN 1000 MG/100ML IV EMUL: 1000 MG/100ML | INTRAVENOUS | Qty: 100

## 2022-06-01 MED FILL — ERAXIS 100 MG IV SOLR: 100 MG | INTRAVENOUS | Qty: 60

## 2022-06-01 MED FILL — ROCURONIUM BROMIDE 50 MG/5ML IV SOLN: 50 MG/5ML | INTRAVENOUS | Qty: 5

## 2022-06-01 MED FILL — DESMOPRESSIN ACETATE 4 MCG/ML IJ SOLN: 4 MCG/ML | INTRAMUSCULAR | Qty: 8

## 2022-06-01 MED FILL — VAZCULEP 10 MG/ML IV SOLN: 10 MG/ML | INTRAVENOUS | Qty: 5

## 2022-06-01 MED FILL — NOREPINEPHRINE BITARTRATE 1 MG/ML IV SOLN: 1 MG/ML | INTRAVENOUS | Qty: 16

## 2022-06-01 MED FILL — THIAMINE HCL 100 MG/ML IJ SOLN: 100 MG/ML | INTRAMUSCULAR | Qty: 2

## 2022-06-01 MED FILL — NOREPINEPHRINE-SODIUM CHLORIDE 16-0.9 MG/250ML-% IV SOLN: INTRAVENOUS | Qty: 250

## 2022-06-01 MED FILL — MIDAZOLAM HCL 2 MG/2ML IJ SOLN: 2 MG/ML | INTRAMUSCULAR | Qty: 2

## 2022-06-01 MED FILL — VANCOMYCIN 2500 MG IN NS 500 ML (PREMIX) IVPB: Qty: 500

## 2022-06-01 MED FILL — FENTANYL CITRATE (PF) 100 MCG/2ML IJ SOLN: 100 MCG/2ML | INTRAMUSCULAR | Qty: 2

## 2022-06-01 MED FILL — SODIUM CHLORIDE 0.9 % IV SOLN: 0.9 % | INTRAVENOUS | Qty: 250

## 2022-06-01 MED FILL — SODIUM BICARBONATE 8.4 % IV SOLN: 8.4 % | INTRAVENOUS | Qty: 50

## 2022-06-01 MED FILL — PHENYLEPHRINE HCL (PRESSORS) 10 MG/ML IV SOLN: 10 MG/ML | INTRAVENOUS | Qty: 1

## 2022-06-01 MED FILL — CEFEPIME HCL 2 G IV SOLR: 2 g | INTRAVENOUS | Qty: 2

## 2022-06-01 MED FILL — SODIUM BICARBONATE 8.4 % IV SOLN: 8.4 % | INTRAVENOUS | Qty: 100

## 2022-06-01 MED FILL — SENSORCAINE-MPF/EPINEPHRINE 0.5% -1:200000 IJ SOLN: INTRAMUSCULAR | Qty: 30

## 2022-06-01 MED FILL — HYDROMORPHONE HCL 2 MG/ML IJ SOLN: 2 MG/ML | INTRAMUSCULAR | Qty: 1

## 2022-06-01 MED FILL — ZOSYN 4.5 (4-0.5) G IV SOLR: 4.5 (4-0.5) g | INTRAVENOUS | Qty: 4500

## 2022-06-01 MED FILL — CHLORHEXIDINE GLUCONATE 0.12 % MT SOLN: 0.12 % | OROMUCOSAL | Qty: 15

## 2022-06-01 MED FILL — VASOPRESSIN 20 UNIT/ML IV SOLN: 20 UNIT/ML | INTRAVENOUS | Qty: 1

## 2022-06-01 NOTE — Anesthesia Procedure Notes (Signed)
Central Venous Line:    A central venous line was placed using ultrasound guidance, in the holding area for the following indication(s): central venous access.06/01/2022 3:45 AM7/31/2023 4:00 AM    Sterility preparation included the following: hand hygiene performed prior to procedure, maximum sterile barriers used and sterile technique used to drape from head to toe.    The patient was placed in Trendelenburg position.The right internal jugular vein was prepped.    The site was prepped with Chloraprep.  A 7 Fr (size), 16 (length), introducer triple lumen was placed.    During the procedure, the following specific steps were taken: target vein identified, needle advanced into vein and blood aspirated and guidewire advanced into vein.    Intravenous verification was obtained by ultrasound, venous blood return and x-ray.    Post insertion care included: all ports aspirated, all ports flushed easily, guidewire removed intact, Biopatch applied, line sutured in place and dressing applied.    During the procedure the patient experienced: patient tolerated procedure well with no complications.      Outcomes: uncomplicated and patient tolerated procedure well  Anesthesia type: general  Staffing  Performed: Anesthesiologist   Anesthesiologist: Wende Crease, MD  Preanesthetic Checklist  Completed: patient identified, IV checked, site marked, risks and benefits discussed, surgical/procedural consents, equipment checked, pre-op evaluation, timeout performed, anesthesia consent given, oxygen available, monitors applied/VS acknowledged, fire risk safety assessment completed and verbalized and blood product R/B/A discussed and consented

## 2022-06-01 NOTE — Consults (Addendum)
.     Consultation Note    NAME: Jaime [name]   DOB:  February 17, 1946   MRN:  [id number]     Date/Time:  06/01/2022 11:36 AM       Assessment :    Plan:  AKI  Sepsis  Hypotension  HTN  DM     AKI related to sepsis and shock. I will check a UA.  CT showed renal cysts.  He has poor UO.  There is not acute need for RRT but I suspect that he will end up needing it.  Repeat labs this afternoon.    Stop LR and start sodium bicarbonate.    Continue pressors for BP support.    Patient is critically ill and at significant risk of deterioration.      DW RN.    Rica Mote [phone]):  RN reports creatinine worse and potassium up.  Remainson pressors.  Will try CVVHD with 0 factor.  Labs q12 hours.  If he does not tolerate CRRT would consider palliative care.       Subjective:   CHIEF COMPLAINT:  Intubated    HISTORY OF PRESENT ILLNESS:     Jaime [name] is a 76 y.o.   male who has a history of DM, HTN admitted with AKI and sepsis.  Review of chart shows that he presented to the ER with several days of diffuse abdominal pain and N/V.  He was evaluated and there was concern for perforated viscus and he was taken to the ER.  He is intubated on multiple pressors.    No past medical history on file.   Past Surgical History:   Procedure Laterality Date    BLADDER SURGERY N/A 06/01/2022    ENDOSCOPY OF ILEAL CONDUIT performed by Guinevere Ferrari, MD at [facility]    LAPAROSCOPY N/A 06/01/2022    LAPAROSCOPY DIAGNOSTIC performed by Guinevere Ferrari, MD at [facility]    LAPAROTOMY N/A 06/01/2022    LAPAROTOMY EXPLORATORY performed by Guinevere Ferrari, MD at [facility]     Social History     Tobacco Use    Smoking status: Not on file    Smokeless tobacco: Not on file   Substance Use Topics    Alcohol use: Not on file      No family history on file.   Allergies   Allergen Reactions    Augmentin [Amoxicillin-Pot Clavulanate] Hives    Codeine      Unknown reaction      Prior to Admission medications    Medication Sig Start Date End Date Taking? Authorizing Provider    metFORMIN (GLUCOPHAGE) 500 MG tablet Take 1 tablet by mouth daily   Yes Historical Provider, MD   metoprolol (LOPRESSOR) 100 MG tablet Take 1 tablet by mouth daily   Yes Historical Provider, MD   hydrALAZINE (APRESOLINE) 25 MG tablet Take 1 tablet by mouth 3 times daily   Yes Historical Provider, MD   atorvastatin (LIPITOR) 40 MG tablet Take 1 tablet by mouth daily   Yes Historical Provider, MD   losartan (COZAAR) 100 MG tablet Take 1 tablet by mouth daily   Yes Historical Provider, MD     REVIEW OF SYSTEMS:     [x]   Unable to obtain reliable ROS due to  []  mental status  []  sedated   [x]  intubated   []  Total of 12 systems reviewed as follows:  Constitutional: negative fever, negative chills, negative weight loss  Eyes:   negative visual changes  ENT:   negative sore throat, tongue or lip swelling  Respiratory:  negative cough, negative dyspnea  Cards:  negative for chest pain, palpitations, lower extremity edema  GI:   negative for nausea, vomiting, diarrhea, and abdominal pain  GU:  negative for frequency, dysuria  Integument:  negative for rash and pruritus  Heme:  negative for easy bruising and gum/nose bleeding  Musculoskel: negative for myalgias,  back pain and muscle weakness  Neuro:  negative for headaches, dizziness, vertigo  Psych:  negative for feelings of anxiety, depression   Travel?: none    Objective:   VITALS:    Vitals:    06/01/22 1130   BP: 126/79   Pulse: (!) 109   Resp: 22   Temp:    SpO2: 95%     PHYSICAL EXAM:  Gen:  []   WD []   WN  []  cachectic []   thin []   obese []   disheveled             [x]   ill apearing  []    Critical  []    Chronic    []   No acute distress    HEENT:   []  NC/AT/PERRL    [x]  pink conjunctivae      []  pale conjunctivae                  PERRL  []  yes  []  no      []  moist mucosa    []  dry mucosa    hearing intact to voice []  yes  []  No                 NECK:   supple []  yes  []  no        masses []  yes  []  No               thyroid  []   non tender  []   tender    RESP:   []  CTA  bilaterally/no wheezing/rhonchi/rales/crackles    [x]  rhonchi bilaterally - no dullness  []  wheezing   []  rhonchi   []  crackles     use of accessory muscles []  yes []  no    CARD:   [x]   regular rate and rhythm/No murmurs/rubs/gallops    murmur  []  yes ()  []  no      Rubs  []  yes  []  no       Gallops []  yes  []  no    Rate []   regular  []   irregular        carotid bruits  []  Right  []   Left                 LE edema [x]  yes  []  no           JVP  []   yes   []   no    ABD:    []  soft/non distended/non tender/+bowel sounds/no HSM    []   Rigid    tenderness []  yes []  no   Liver enlargement  []   yes []   no                Spleen enlargement  []   yes []   no     distended []   yes []  no     bowel sound  []  hypoactive   []  hyperactive    LYMPH:    Neck []   yes []   no       Axillae []   yes []   no    SKIN:   Rashes []   yes   []   no    Ulcers []   yes   []   no               []  tight to palpitation    skin turgor []   good  []  poor  []  decreased               Cyanosis/clubbing []   yes []   no    NEUR:   []  cranial nerves II-XII grossly intact       []  Cranial nerves deficit                 []   facial droop    []   slurred speech   []  aphasic     []  Strength normal     []   weakness  []   LUE  []    RUE/ []   LLE  []    RLE    follows commands  []   yes []   no           PSYCH:   insight []  poor []  good   Alert and Oriented to  []  person  []  place  []   time                    []  depressed []  anxious []  agitated  []  lethargic []  stuporous  []  sedated     LAB DATA REVIEWED:    Recent Labs     06/01/22  0551 06/01/22  0618   WBC 14.6* 12.5*   HGB 12.2 12.3   HCT 37.6 37.3   PLT 50* 46*     Recent Labs     06/01/22  0003 06/01/22  0204 06/01/22  0551 06/01/22  0618   NA 139 139 142  --    K 3.9 3.9 4.8  --    CL 103 106 110*  --    CO2 19* 15* 18*  --    BUN 66* 64* 64*  --    MG 1.7 1.5*  --  1.5*   PHOS  --  3.6  --  6.6*     Recent Labs     06/01/22  0003 06/01/22  0618   ALT 658* 930*   GLOB 3.7 2.4     Recent Labs     06/01/22  0003   INR 1.4*       No results for input(s): TIBC, FERR in the last 72 hours.    Invalid input(s): FE, PSAT   No results for input(s): PH, PCO2, PO2 in the last 72 hours.  No results for input(s): CPK, CKMB, TROPONINI in the last 72 hours.  No results found for: GLUCPOC    Procedures: see electronic medical records for all procedures/Xrays and details which were not copied into this note but were reviewed prior to creation of Plan.    ________________________________________________________________________       ___________________________________________________  Consulting Physician: , MD

## 2022-06-01 NOTE — H&P (Signed)
Subjective:      Jaime [name] is a 76 y.o. male who presents to the ER with abdominal pain. The pain is located in the diffusely .  Pain is described as aching and dull, and measures 10/10 in intensity.  Onset of pain was a few days ago. Aggravating factors include pressure. Alleviating factors include none. Associated symptoms include nausea and vomiting.    The patient has been hypotensive in the ER and has been started on levophed     He denies a history of abdominal surgery     No past medical history on file.  No past surgical history on file.  No family history on file.  Social History     Socioeconomic History    Marital status: Married     No current facility-administered medications on file prior to encounter.     Current Outpatient Medications on File Prior to Encounter   Medication Sig Dispense Refill    metFORMIN (GLUCOPHAGE) 500 MG tablet Take 1 tablet by mouth daily      metoprolol (LOPRESSOR) 100 MG tablet Take 1 tablet by mouth daily      hydrALAZINE (APRESOLINE) 25 MG tablet Take 1 tablet by mouth 3 times daily      atorvastatin (LIPITOR) 40 MG tablet Take 1 tablet by mouth daily      losartan (COZAAR) 100 MG tablet Take 1 tablet by mouth daily       Allergies   Allergen Reactions    Augmentin [Amoxicillin-Pot Clavulanate] Hives    Codeine      Unknown reaction       Review of Systems  A comprehensive review of systems was negative.       Objective:      BP 105/68   Pulse 96   Temp 98.7 F (37.1 C) (Axillary)   Resp (!) 40   Ht 1.727 m (5\' 8" )   Wt 102 kg (224 lb 14.4 oz)   SpO2 90%   BMI 34.20 kg/m     General:   alert, appears stated age, cooperative, pale, and toxic   Hydration:  moderately dehydrated   Lungs:   clear to auscultation bilaterally   Heart:   regular rate and rhythm, S1, S2 normal, no murmur, click, rub or gallop   Abdomen:  Soft, distended, tender with peritoneal signs      Imaging  IMPRESSION:  Extraluminal air bubbles in the right upper quadrant concerning for  gastric  perforation. Inflammatory changes are noted about the tail of the pancreas  extending into the left anterior pararenal space, please correlate with any  evidence of acute pancreatitis. Bilateral lower lobe infiltrates.      Lab Review  Lab Results   Component Value Date/Time    WBC 16.6 06/01/2022 12:03 AM    HCT 42.5 06/01/2022 12:03 AM    HGB 13.7 06/01/2022 12:03 AM    PLT 60 06/01/2022 12:03 AM        Lab Results   Component Value Date/Time    AST 812 06/01/2022 12:03 AM    ALT 658 06/01/2022 12:03 AM    ALKPHOS 162 06/01/2022 12:03 AM    PROT 6.9 06/01/2022 12:03 AM      No results found for: APPEARCSF, COLORCSF, PHUR, SPECGRAV, GLUCOSEU, BLOODU, PROTEINUR, UROBILINOGEN, BILIRUBINUR, LEUKOCYTESUR, NITRITE  No results found for: HCG      Lactate    Assessment:     76 year old diabetic male with sepsis secondary to a perforated  hollow viscus.   Patient is acidotic and hypotensive.  Given the appearance of the CT I suspect a perforated duodenal ulcer.       Plan:      1. I recommend proceeding with diagnostic laparoscopy with repair of bowel perforation possible open surgery.. Treatment alternatives were discussed.    2. Discussed aspects of surgical intervention, methods, risks (including by not limited to infection, bleeding, hematoma, and perforation of the intestings or solid organs) and the risks of general anesthetic including MI, CVA, sudden death or even reaction to anesthetic medications. The patient and his wife understand the risks, any and all questions were answered to the patient's satisfaction.  3. Patient and his wife do wish to proceed with surgery.  Written consent was obtained.

## 2022-06-01 NOTE — Other (Signed)
CVVHD innitiated        06/01/22 2305   Treatment   $CRRT $Yes   Machine #   ([id number])   Therapy Type CVVHD   Access   Add Access? Yes   Pressures   Access (mmHg) -74 mmHg   Return/Venous (mmHg) 183 mmHg   Effluent (mmHg) (!) 473 mmHg   Filter (mmHg) 311 mmHg   TMP Pressure (mmHg) -231 mmHg   Pressure Drop (mmHg) 73 mmHg   Deaeration Chamber Check Yes   Flow Rates   Blood Flow (mL/min) 250 mL/min   Dialysate (mL/hr) 2550 mL/hr   Hemodialysis Central Access Right Femoral vein   Placement Date/Time: 06/01/22 1830   Present on Admission/Arrival: No  Inserted by: Dr. Christel Mormon  Insertion Practices: Chlorohexadine skin antisepsis;Maximal barrier precautions;Sterile ultrasound technique;Optimal catheter site selection;Betadine...   Continued need for line? Yes   Site Assessment Clean, dry & intact   Venous Lumen Status Blood return noted   Arterial Lumen Status Blood return noted   Alcohol Cap Used Yes   Line Care Ports disinfected   Dressing Type Sterile dressing, transparent   Date of Last Dressing Change 06/01/22   Dressing Status Clean, dry & intact     Report given to Molli Hazard Given, RN    Arrived to initiate CRRT. Patient's labs, notes, orders and code status reviewed.  Consent on file.  HF1000 primed and tested and therapy initiated at 2305.  Lines visible and supported.       Hep B pending

## 2022-06-01 NOTE — H&P (Signed)
CRITICAL CARE NOTE      Name: Jaime [name]   DOB: October 01, 1946   MRN: [id number]   Date: 06/01/2022      REASON FOR ICU ADMISSION:  Perforated Viscous     PRINCIPAL ICU DIAGNOSIS   Perforated Viscus  Septic Shock  Acute kidney Injury    BRIEF PATIENT SUMMARY   76 y/o male PMHx of HTN, HLD and T2DM admitted 7/31 for septic shock in the setting of perforated viscus.  Presented to ED via EMS c/o approximately 2 days of severe abdominal pain, nausea and vomiting. He is currently in town from [place] for the [employer]  ED work- up revealed  B/P 80/42, HR 112, Afebrile, Leukocytosis 16.6, lactic acidosis 10,  Acute Kidney Injury, Cr 4.06. CT A/P demonstrated perforated viscous. Received 2L LR, started on Levophed, Vancomycin/Zosyn. Surgery consulted->to OR for emergent exploratory laparotomy for likely perforated gastric/ duodenal ulcer. ICU consulted for admission.    COMPREHENSIVE ASSESSMENT & PLAN:SYSTEM BASED     24 HOUR EVENTS:   To OR for Exploratory Laporatromy for perforated viscus    NEUROLOGICAL:   No active issues  CT head- negative  Close neurological monitoring    PULMONOLOGY:   Likely will remain on ventilator post-op  Supplemental oxygen for goal Spo2 > 94 %  CXR-bilateral lower lobe atelectasis    CARDIOVASCULAR:  Hx of HTN, HLD   Septic Shock 2/2 perforated viscus  Received 2L fluid resuscitation in ED  Vasopressors to keep MAP 65 and above for adequate tissue perfusion.     GASTROINTESTINAL   Perforated Viscous  To OR for plan for exploratory laporatomy     RENAL/ELECTROLYTE/FLUIDS:   Acute Kidney Injury, metabolic acidosis  Replenish electrolytes as indicated  IVF 125 cc/hr  Goal urine output > 0.5 cc/kg/hr, Strict I/Os, avoid nephrotoxins    ENDOCRINE:  Hx of T2DM   Hold home metformin  Glucose checks, SSI  Blood Sugar Goal 120-180, Avoid hypo/hyperglycemia    HEMATOLOGY/ONCOLOGY:   H/H Stable  Transfuse for Hgb < 7 g/ dl    INFECTIOUS DISEASE:   Septic Shock d/t perforated viscus  Monitor  lactate as the surrogate for tissue perfusion. Cont. Antibiotics. Follow cultures.    ANTIBIOTICS TO DATE:  Vancomycin (7/31-current)  Zosyn (7/31-current)    CULTURES TO DATE:  BC (7/31-current)    ICU DAILY CHECKLIST     Code Status:FULL  DVT Prophylaxis:SCDs  T/L/D:PIV, Art  SUP: Pending  Diet: NPO  Activity Level:As indicated  ABCDEF Bundle/Checklist Completed:Yes  Disposition: Admit to ICU  Multidisciplinary Rounds Completed:  Pending  Patient/Family Updated: Wife update on phone [phone]      HOSPITAL COURSE/DAILY EVENT LOG       SUBJECTIVE   Review of Systems   Constitutional:  Positive for fatigue.   HENT: Negative.     Eyes: Negative.    Respiratory: Negative.     Cardiovascular: Negative.    Gastrointestinal:  Positive for abdominal pain, nausea and vomiting. Negative for diarrhea.   Genitourinary:  Positive for decreased urine volume.   Musculoskeletal: Negative.    Skin:  Positive for pallor.   Allergic/Immunologic: Negative.    Neurological: Negative.    Hematological: Negative.    Psychiatric/Behavioral: Negative.          OBJECTIVE   Physical Exam  Vitals reviewed.   Constitutional:       Appearance: He is ill-appearing and toxic-appearing.   HENT:      Head: Normocephalic and atraumatic.  Nose: Nose normal.      Mouth/Throat:      Mouth: Mucous membranes are dry.   Eyes:      Pupils: Pupils are equal, round, and reactive to light.   Cardiovascular:      Rate and Rhythm: Regular rhythm. Tachycardia present.      Pulses: Normal pulses.      Heart sounds: Normal heart sounds.   Pulmonary:      Effort: Pulmonary effort is normal.      Breath sounds: Normal breath sounds. No wheezing.   Abdominal:      General: There is distension.      Palpations: Abdomen is soft.      Tenderness: There is abdominal tenderness.   Musculoskeletal:         General: Normal range of motion.      Cervical back: Normal range of motion.      Right lower leg: No edema.      Left lower leg: No edema.   Skin:     General:  Skin is warm and dry.      Coloration: Skin is pale.   Neurological:      General: No focal deficit present.      Mental Status: He is alert and oriented to person, place, and time.   Psychiatric:      Comments: Unable to assess d/t acuity        Labs and Data: Reviewed 06/01/22  Medications: Reviewed 06/01/22  Imaging: Reviewed 06/01/22      BP (!) 74/51   Pulse 99   Temp 98.7 F (37.1 C) (Axillary)   Resp (!) 39   Ht 1.727 m (5\' 8" )   Wt 102 kg (224 lb 14.4 oz)   SpO2 92%   BMI 34.20 kg/m      Temp (24hrs), Avg:98.7 F (37.1 C), Min:98.7 F (37.1 C), Max:98.7 F (37.1 C)           Intake/Output:     Intake/Output Summary (Last 24 hours) at 06/01/2022 0531  Last data filed at 06/01/2022 06/03/2022  Gross per 24 hour   Intake 2750 ml   Output 0 ml   Net 2750 ml       Imaging    CT Abdomen/Pelvis 06/01/2022  RADIOLOGY IMPRESSION:  Extraluminal air bubbles in the right upper quadrant concerning for gastric  perforation. Inflammatory changes are noted about the tail of the pancreas  extending into the left anterior pararenal space, please correlate with any  evidence of acute pancreatitis. Bilateral lower lobe infiltrates.      CRITICAL CARE DOCUMENTATION  I had a face to face encounter with the patient, reviewed and interpreted patient data including clinical events, labs, images, vital signs, I/O's, and examined patient.  I have discussed the case and the plan and management of the patient's care with the consulting services, the bedside nurses and the respiratory therapist.      NOTE OF PERSONAL INVOLVEMENT IN CARE   This patient has a high probability of imminent, clinically significant deterioration, which requires the highest level of preparedness to intervene urgently. I participated in the decision-making and personally managed or directed the management of the following life and organ supporting interventions that required my frequent assessment to treat or prevent imminent deterioration.    I personally  spent 50 minutes of critical care time.  This is time spent at this critically ill patient's bedside actively involved in patient care as well as the coordination of  care.  This does not include any procedural time which has been billed separately.    Lyndal Pulley, APRN - NP   Critical Care Medicine  [facility]

## 2022-06-01 NOTE — Progress Notes (Signed)
TRANSFER - IN REPORT:    Verbal report received from [place], Rn on Jaime [name]  being received from Main OR for routine post-op      Report consisted of patient's Situation, Background, Assessment and   Recommendations(SBAR).     Information from the following report(s) Nurse Handoff Report, Index, Surgery Report, Intake/Output, and MAR was reviewed with the receiving nurse.    Opportunity for questions and clarification was provided.      Assessment completed upon patient's arrival to unit and care assumed.

## 2022-06-01 NOTE — Op Note (Signed)
[facility]  OPERATIVE REPORT    Name:  Jaime [name], Jaime [name]  MR#:  [id number]  DOB:  1945/11/16  ACCOUNT #:  [id number]  DATE OF SERVICE:  06/01/2022    PREOPERATIVE DIAGNOSES:  1.  Pneumoperitoneum.  2.  Hypotension.  3.  Sepsis.    POSTOPERATIVE DIAGNOSES:  1.  Pneumoperitoneum.  2.  Hypotension.  3.  Sepsis.    PROCEDURE PERFORMED:  1.  Diagnostic laparoscopy.  2.  Diagnostic laparotomy.  3.  EGD.    SURGEON:  Guinevere Ferrari, MD    ASSISTANT:  Lorin Picket O'Dell    ANESTHESIA:  General endotracheal.    COMPLICATIONS:  None.    SPECIMENS REMOVED:  Anaerobic and aerobic culture of peritoneal fluid.    IMPLANTS:  None.    ESTIMATED BLOOD LOSS:  Minimal.    DRAINS:  18-French nasogastric tube and two 19-French round closed suction drains, one along the second portion of the duodenum extending on top of the first portion of the duodenum and the lesser curve of the stomach.  The second from the mid lesser curve of the stomach to the GE junction.    FINDINGS:  1.  A small amount of cloudy fluid in the right upper quadrant.  2.  Yellow/white inflammatory peel over the lesser curve of the stomach and inferior aspect of the liver.  3.  Induration of a lesser curve fat.  4.  No gross perforation identified.  5.  The stomach soft and pliable with no palpable induration or mass.  6.  Normal gastroesophageal junction.  7.  No fluid or inflammation in the lesser sac, but the pancreas and retroperitoneum did appear edematous.  8.  Normal duodenum including the second and third portions which were thoroughly examined after a full Kocher maneuver.  9.  Normal small bowel.  10.  Normal appendix.  11.  Normal colon from cecum to rectum.  12.  A normal esophageal mucosa on EGD.  13.  Moderate gastritis with no ulcerations or masses concerning for a perforation source were identified on the mucosal surface.  14.  No ulcers identified in the duodenal bulb.    INDICATIONS:  The patient is a 76 year old man who presented to the  emergency department with nausea, vomiting and generalized weakness.  The patient was lethargic and hypotensive.  He admits to a several-day history of nausea, vomiting, fevers, cough and shortness of breath.  He denied abdominal pain, but was tender on exam.  Abdomen and pelvis CT demonstrated extraluminal air bubbles in the right upper quadrant along the lesser curve of the stomach concerning for a gastric perforation, so the patient is taken to the operating room for exploration.    PROCEDURE:  The patient was identified in the preoperative holding area, informed consent obtained from the patient and his wife via the phone.  The patient was then taken to the operating room, placed in supine position, underwent general endotracheal anesthesia without complication.  His abdomen was then prepped and draped in usual sterile fashion.  A time-out was performed to confirm the patient by name and that he was presenting for diagnostic laparoscopy, possible laparotomy.  Once this was confirmed, entry into his abdomen was obtained in the right lower quadrant in the anterior axillary line.  At this site, 3 mL of 0.5% Marcaine were injected in subcutaneous space.  A 5-mm incision made and then a 5-mm optical trocar containing a 5-mm 0-degree laparoscope was used to dissect the layers of the  abdominal wall under direct laparoscopic vision.  Entry into the abdomen was confirmed visually.  Once within the abdomen, insufflation was provided through this trocar to a pressure of 15 mmHg.  The patient tolerated insufflation well and there were no apparent complications.  A 360 degree evaluation of the abdomen was then performed from this trocar site.    The patient was found to have his entire omentum in the right upper quadrant obscuring view of the liver and the stomach.  The patient did not have frank peritonitis.  On visual examination, the parietal peritoneum did not appear red or inflamed.  Attention was then focused at the  umbilicus and a 5-mm trocar inserted under direct laparoscopic vision.  The camera was switched to this trocar site and attention focused in the right lateral abdominal wall.  A second 5-mm trocar was placed in the midclavicular line at the level of the umbilicus.  The patient was then placed in reverse Trendelenburg and attention focused on the upper abdomen.  The omentum was retracted inferiorly.  The omentum came down easily without any resistance and there did not appear to be any inflammatory adhesions holding the omentum in place.  The patient was noted to have a cloudy reddish brown fluid in the right upper quadrant.  This was aspirated for culture.  The inferior edge of the right lobe of the liver was elevated with a blunt grasper.  The patient was found to have a yellowish white fibrinous material attached to the undersurface of the liver as well as the pyloric channel and of the lesser curve mesentery consistent with a perforated duodenal ulcer.  Therefore an Endo Close was used to advance a 0 silk through the abdominal wall at the costal margin in the midclavicular line.  The silk was passed around the falciform and brought back out through the same stab incision.  This silk was then clamped to the skin surface under tension to hold the right lobe of the liver up off of the pyloric channel.    With the liver retracted out of the way, the area was examined.  The pylorus was identified.  The first portion of the duodenum was identified.  There were some adhesions between the gallbladder and the duodenum.  These were taken down with Metzenbaum scissors.  The gallbladder was retracted with a blunt grasper over the dome of the liver and the entire first portion of the duodenum was exposed.  There was no gross perforation identified.  The first portion of the duodenum was actually soft and pliable.  The fat superior to the first portion of the duodenum was inflamed.  It was edematous and erythematous.  The  stomach was distended and fluid-filled.  So, attempts were made to compress the antrum to see if a leak could be identified in this region and the first portion of duodenum definitely filled when the antrum was compressed but no leak was identified.  The attention was then focused along the lesser curve of the stomach in the area of the antrum and the incisura.  The fat in this area was definitely inflamed and there was inflammatory peel, but no gross perforation could be identified.    A Nathanson liver retractor was inserted and the left lobe of the liver was elevated towards the anterior abdominal wall to better expose the upper stomach.  The inflammation continued up to the GE junction, but no perforation could be identified.  So attention was focused in the area of  the incisura.  A Harmonic scalpel was used to make a window between the mesentery of the lesser curve of the stomach and the lesser curve of the stomach.  This allowed entry into the lesser curve space and there was no fluid.  Attention was then focused on the greater curve and the gastrocolic ligament was divided with the LigaSure Impact for a length of about 6 cm which allowed entry into the lesser sac and that there was no gross fluid identified to suggest a perforation.  The posterior aspect of the stomach appeared completely normal.  There was visible edema in and around the pancreas.  Since no gross perforation could be identified, attention was focused on the transverse colon.  It was examined laparoscopically and there no signs of perforation.    At this point, Anesthesia suggested completing the surgery or converting to laparotomy because the patient was requiring escalating pressors that was felt to be due to the pneumoperitoneum.  Therefore, the pneumoperitoneum was vented.  Because no source was found laparoscopically and the patient was ill, a laparotomy was performed.  A midline incision was made from the xiphoid extending to 2 cm below  the umbilicus.  A scalpel was used to cut through the skin.  The subcutaneous tissue was divided with electrocautery.  The linea alba was divided with electrocautery.  The peritoneum was elevated between two DeBakey forceps and was divided with Metzenbaum scissors and then inserted my finger into the peritoneal cavity and divided the remainder of the peritoneum over my finger to protect underlying viscera.  A wound protector was then inserted.    Attention was then focused in the right upper quadrant.  Once again the first portion of the duodenum was examined this time by palpating as well as visually.  There was no edema, thickening or mass palpable in this region of the duodenum.  The fat of the just superior to the duodenum was definitely inflamed, but no perforation could be identified.  The stomach was fluid-filled and by compressing the stomach no leak was identified.  The gastrocolic ligament was completely divided at this point using a LigaSure.  The white line of Toldt along the right-hand side was divided with electrocautery and the entire hepatic flexure of the colon was retracted medially to expose the second portion of the duodenum.  A Kocher maneuver was performed to examine the second and third portions of the duodenum which appeared completely normal without any signs of perforation.  The bowel was soft and pliable, but there was edema in the retroperitoneal space.  This edema was clear with simple clear fluid.  Once this was completed, the body and fundus of the stomach were examined both visually and by palpation.  There were no abnormalities identified.  The GE junction was examined visually and by palpation and there was nothing abnormal identified other than the fat along the lesser curve was inflamed.    Next, the transverse colon was examined.  It was palpated.  It was visually examined.  There was no abnormalities identified.  The right colon was examined.  The appendix appeared normal.  The  terminal ileum was identified.  There were some adhesions of the distal ileum to the right lateral abdominal wall at the pelvic inlet, but otherwise there were no abnormalities identified.  The small bowel was run all the way to the ligament of Treitz.  There were no abnormalities identified.  At the ligament of Treitz, there was significant retroperitoneal edema between  the ligament of Treitz and the left colic vein.  The splenic flexure was examined and the colon appeared normal.  The descending colon appeared normal.  The sigmoid colon appeared normal and the rectum appeared normal.  After thorough examination, the lesser sac was examined.  The stomach was retracted cephalad.  There was edema around the pancreas in the retroperitoneal space, but there were no signs of a perforation on the posterior aspect of the stomach.  Therefore after this, a very thorough open exploration with no findings.  It was felt that it is possible the patient had a perforation that had healed.  So, it was decided to perform an endoscopy to look at the mucosal surface.    So, I broke scrub, went to the head of the bed.  I advanced an endoscope through the patient's mouth and down his esophagus under direct endoscopic vision.  The esophagus mucosa appeared normal.  There was a scant amount of blood in the distal esophagus consistent with NG attempts.  The GE junction appeared normal.  There were no mucosal lesions.  The stomach was entered.  There was fluid in the abdomen daily within the stomach that was aspirated.  The stomach distended well.  There was moderate gastritis in the body and antrum of the stomach, but there were no ulcerations identified.  The entire lesser curve was examined.  The area of the incisura was examined and there were no ulcerations identified.  The scope was retroflexed.  The cardia and GE junction appeared normal in retroflexed view.  The scope was then advanced into the pylorus into the duodenal bulb and  there were no mucosal abnormalities identified in the duodenal bulb.  Attempts were made to advance the scope further down the duodenum with the help of the surgical assistant pressing externally to help guide the scope into the second portion of the duodenum, but due to curling of the scope within the gastric body this was not successful, so this was aborted.  The stomach was decompressed.  I removed the endoscope and then inserted an 18-French nasogastric tube which was palpated to be in the body of the stomach.    I then scrubbed and returned to the bedside.  The abdomen was irrigated with 2 L of normal saline.  It was checked for any retained foreign bodies.  One further visual inspection was completed and there were no signs to suggest the cause of the patient's sepsis other than the inflamed fat along the lesser curve.  Two 19-French Blake drains were inserted through the trocar sites.  One drain was placed along the right gutter extending over the curve of the second portion of the duodenum and on top of the first portion of the duodenum.  The second was placed starting about the incisura and advanced up to the GE junction along the lesser curve of the stomach.  Both these drains were sutured in place with 2-0 nylon.  The omentum was placed over all of the exposed viscera.  The fascia was closed with a Stratafix symmetric.  Subcutaneous tissue was irrigated with Irrisept irrigation solution.  The skin closed with staples and dry dressing was applied.  The patient was taken from the OR to the postanesthesia care unit in critical, but stable condition.  Instrument and sponge counts were correct x2.      Guinevere Ferrari, MD      [id number]  D:  06/01/2022 6:43  T:  06/01/2022 12:38  JOB #:  [id number]

## 2022-06-01 NOTE — Progress Notes (Signed)
Participated in CCU IDR where pt was discussed.

## 2022-06-01 NOTE — Procedures (Signed)
[facility]      Procedure Note - Central Venous Access:   Performed by Hilaria Ota, MD    Obtained informed Consent.    Immediately prior to the procedure, the patient was reevaluated and found suitable for the planned procedure and any planned medications.    Immediately prior to the procedure a time out was called to verify the correct patient, procedure, equipment, staff, and marking as appropriate.    The site was prepped with ChloraPrep and Sterile draping.   Using Seldinger technique a Hemodialysis Catheter was placed in the Right, Femoral Vein via direct cannulation with 2 number of attempts for Dialysis.  Ultrasound Guidance was utilized. Verbally/Visually confirmed wire removal with RN. Wire in IJ confirmation with [name] prior to dilation. There was good blood return.  The following complications were encountered: None.  The procedure was tolerated well.      Hilaria Ota, MD PCCM  [facility]  [phone]

## 2022-06-01 NOTE — Anesthesia Procedure Notes (Signed)
Arterial Line:    An arterial line was placed using ultrasound guidance, in the pre-op for the following indication(s): continuous blood pressure monitoring and blood sampling needed.    A 20 gauge (size), 1 and 3/4 inch (length), Arrow (type) catheter was placed, Seldinger technique not used, into the right radial artery, secured by tape and Tegaderm.  Anesthesia type: Local  Local infiltration: Injection    Events:  patient tolerated procedure well with no complications.06/01/2022 1:50 AM7/31/2023 2:00 AM  Anesthesiologist: Wende Crease, MD  Performed: Anesthesiologist   Preanesthetic Checklist  Completed: patient identified, IV checked, site marked, risks and benefits discussed, surgical/procedural consents, equipment checked, pre-op evaluation, timeout performed, anesthesia consent given, oxygen available, monitors applied/VS acknowledged, fire risk safety assessment completed and verbalized and blood product R/B/A discussed and consented

## 2022-06-01 NOTE — Progress Notes (Signed)
[facility] PROGRESS NOTE.      Name: Jaime [name]   DOB: December 04, 1945   MRN: [id number]   Date: 06/01/2022      Chief Complaint   Patient presents with    Hypotension     Hx hypertension - BP is 70s/40s    Altered Mental Status    Nausea    Emesis       Reason for ICU:     Acute abdomen, s/p emergent exploratory laparotomy    Hospital Course:     76 y/o male (visiting [place] from [place]) with PMHx significant of HTN, HLD and T2DM. Came in ED 7/30 noon for N/V/Abd pain and generalized weakess. Found to be encephalopathic, shock state (BP 80s and lactate 10), AKI (Cr 4.06). Received 2L LR, started on Levophed, Vancomycin/Zosyn. Surgery consulted and patient underwent emergent exploratory laparotomy last night that did not reveal perforation but fluid collection in right upper quadrant that was sent for cultures and sensitivity. Patient transferred to ICU post op 06/01/22 AM.       Subjective/ Objective:     This AM, On arrival in ICU, intubated (PEEP 5' FiO260%, TV 600) and on 3 pressors (Levo, vaso and neo). Unable to give history, sedated with propofol at 25.   Urine out put dropping.   Afebrile in AM but spiked 102 at noon, mild leukocytosis on labs this AM. Abd fluid cultures are NGTD for now. Currently on Vanc/ Zosyn dn eraxis added overnight.   Nephrology evaluated the patient, recommending no urgent need of RRT for now    Principle ICU Diagnosis     # Septic shock  # Acute abdomen from suspected abdominal viscus perforation   Lactate 8.4 on admit   On Vanc/ Zosyn and eraxis   S/p emergent exploratory laparotomy 06/01/22   Abd fluid cultures from OR 7/31 NGTD  Blood cultures 7/31 NGTD   # Acute respiratory failure with hypoxemia and hypercapnia, ventilatory dependant   Intubated 06/01/22  # Acute encephalopathy, likely metabolic and toxic from sepsis  # Acute Kidney injury, oliguric to anuric. Likely ATN   Nephrology following   # Non anion gap metabolic acidosis from lactic acidosis   LR changed to bicarb drip  06/01/22  # elevated troponin, likely demand ischemia     Plan  - Wean Phenylepherine drip off first, followed by NE up to 15 mcg and then stop vasopresin. Wean NE at the end  - Continue bicarb drip per nephro  - OK to change to merrem. Continue vancomycin  - follows blood and ab fluid cultures  - trend lactate 4 hourly until normalized < 2  - Increase PEEP to 7 to drop FiO2 50% or less.   - Repeat ABG at noon to adjust ven as needed. Continue lung protective vent support per protocol  - add fentanyl to propofol, wean for RAAS -1 to -2    I personally spent 50 minutes of critical care time.  This is time spent at this critically ill patient's bedside actively involved in patient care as well as the coordination of care and discussions with the patient's family.  This does not include any procedural time which has been billed separately.       CRITICAL CARE CONSULTANT NOTE  I had a face to face encounter with the patient, reviewed and interpreted patient data including clinical events, labs, images, vital signs, I/O's, and examined patient.  I have discussed the case and the plan and management of  the patient's care with the consulting services, the bedside nurses and the respiratory therapist.      NOTE OF PERSONAL INVOLVEMENT IN CARE   This patient has a high probability of imminent, clinically significant deterioration, which requires the highest level of preparedness to intervene urgently. I participated in the decision-making and personally managed or directed the management of the following life and organ supporting interventions that required my frequent assessment to treat or prevent imminent deterioration.    Yevonne Pax, MD   [facility]  [phone]  06/01/2022      Review of systems:     Review of Systems   Unable to obtain, patient is intubated and sedated.     Objective:     Vital Signs:  BP 95/60   Pulse 96   Temp 99.7 F (37.6 C) (Axillary)   Resp 20   Ht 1.727 m (5\' 8" )   Wt 102 kg  (224 lb 14.4 oz)   SpO2 96%   BMI 34.20 kg/m      Temp (24hrs), Avg:99.1 F (37.3 C), Min:98.7 F (37.1 C), Max:99.7 F (37.6 C)           Intake/Output:     Intake/Output Summary (Last 24 hours) at 06/01/2022 0717  Last data filed at 06/01/2022 0703  Gross per 24 hour   Intake 2750 ml   Output 325 ml   Net 2425 ml       Physical Exam  HENT:      Head: Normocephalic and atraumatic.   Eyes:      Pupils: Pupils are equal, round, and reactive to light.   Cardiovascular:      Rate and Rhythm: Normal rate and regular rhythm.      Pulses: Normal pulses.      Heart sounds: Normal heart sounds.   Abdominal:      General: There is distension.     Intubated and sedated.     Past Medical History:        has no past medical history on file.    Past Surgical History:      has no past surgical history on file.      Home Medications:     Prior to Admission medications    Medication Sig Start Date End Date Taking? Authorizing Provider   metFORMIN (GLUCOPHAGE) 500 MG tablet Take 1 tablet by mouth daily   Yes Historical Provider, MD   metoprolol (LOPRESSOR) 100 MG tablet Take 1 tablet by mouth daily   Yes Historical Provider, MD   hydrALAZINE (APRESOLINE) 25 MG tablet Take 1 tablet by mouth 3 times daily   Yes Historical Provider, MD   atorvastatin (LIPITOR) 40 MG tablet Take 1 tablet by mouth daily   Yes Historical Provider, MD   losartan (COZAAR) 100 MG tablet Take 1 tablet by mouth daily   Yes Historical Provider, MD         Allergies/Social/Family History:     Allergies   Allergen Reactions    Augmentin [Amoxicillin-Pot Clavulanate] Hives    Codeine      Unknown reaction      Social History     Tobacco Use    Smoking status: Not on file    Smokeless tobacco: Not on file   Substance Use Topics    Alcohol use: Not on file      No family history on file.    LABS AND  DATA:   Reviewed    Peak airway  pressure:      Minute ventilation:

## 2022-06-01 NOTE — Brief Op Note (Signed)
Brief Postoperative Note      Patient: Jaime [name]  Date of Birth: Oct 24, 1946  MRN: [id number]    Date of Procedure: 06/01/2022    Pre-operative Diagnosis:  1. Pneumoperitoneum 2. Hypotension 3. Sepsis     Post-Op Diagnosis: Same       Procedure(s):  LAPAROTOMY EXPLORATORY  LAPAROSCOPY DIAGNOSTIC  ENDOSCOPY OF ILEAL CONDUIT  EGD    Surgeon(s):  Guinevere Ferrari, MD    Assistant:  Surgical Assistant: Christianne Borrow    Anesthesia: General    Estimated Blood Loss (mL): Minimal    Complications: None    Specimens:   ID Type Source Tests Collected by Time Destination   1 : peritoneal fluid Body Fluid Fluid CULTURE, BODY FLUID Guinevere Ferrari, MD 06/01/2022 [phone]        Implants:  * No implants in log *      Drains:   NG/OG/NJ/NE Tube Nasogastric 18 fr Left nostril (Active)       Urinary Catheter 06/01/22 Foley (Active)       Findings: 1) small amount cloudy fluid in RUQ  2) yellow/white inflammatory peel over the lesser curve of the stomach and the inferior aspect of the liver.   3) induration of the lesser curve fat 4) no gross perforation identified.  5)stomach soft and pliable, no palpable induration. 6) GE junction normal. 7) lesser sac normal 8) duodenum normal including 2nd and 3rd portions after full Kocher maneuver.  9) normal small bowel.  10) Normal appendix  11) normal colon from cecum to rectum 12) Normal esophageal mucosa  13) moderate gastritis but no ulcerations identified on the mucosal surface 14) no mucosal ulceration in the duodenal bulb      Electronically signed by Guinevere Ferrari, MD on 06/01/2022 at 5:28 AM

## 2022-06-01 NOTE — Anesthesia Pre-Procedure Evaluation (Signed)
Department of Anesthesiology  Preprocedure Note       Name:  Jaime [name]   Age:  76 y.o.  DOB:  Mar 14, 1946                                          MRN:  [id number]         Date:  06/01/2022      Surgeon: Moishe Spice):  Guinevere Ferrari, MD    Procedure: Procedure(s):  LAPAROTOMY EXPLORATORY    Medications prior to admission:   Prior to Admission medications    Medication Sig Start Date End Date Taking? Authorizing Provider   metFORMIN (GLUCOPHAGE) 500 MG tablet Take 1 tablet by mouth daily   Yes Historical Provider, MD       Current medications:    Current Facility-Administered Medications   Medication Dose Route Frequency Provider Last Rate Last Admin   . norepinephrine (LEVOPHED) 16 mg in sodium chloride 0.9 % 250 mL infusion  1-100 mcg/min IntraVENous Continuous Darliss Cheney Min-Venditti, MD 4.7 mL/hr at 06/01/22 0133 5 mcg/min at 06/01/22 0133   . sodium chloride flush 0.9 % injection 5-40 mL  5-40 mL IntraVENous 2 times per day Sarita Bottom, APRN - NP       . sodium chloride flush 0.9 % injection 5-40 mL  5-40 mL IntraVENous PRN Sarita Bottom, APRN - NP       . 0.9 % sodium chloride infusion   IntraVENous PRN Sarita Bottom, APRN - NP       . acetaminophen (TYLENOL) tablet 650 mg  650 mg Oral Q6H PRN Sarita Bottom, APRN - NP        Or   . acetaminophen (TYLENOL) suppository 650 mg  650 mg Rectal Q6H PRN Sarita Bottom, APRN - NP       . ondansetron (ZOFRAN) injection 4 mg  4 mg IntraVENous Q6H PRN Sarita Bottom, APRN - NP       . insulin lispro (HUMALOG) injection vial 0-8 Units  0-8 Units SubCUTAneous Q6H Sarita Bottom, APRN - NP       . vancomycin (VANCOCIN) 2500 mg in sodium chloride 0.9 % 500 mL IVPB  25 mg/kg IntraVENous Once Alan Ripper E Min-Venditti, MD 200 mL/hr at 06/01/22 0122 2,500 mg at 06/01/22 0122     Current Outpatient Medications   Medication Sig Dispense Refill   . metFORMIN (GLUCOPHAGE) 500 MG tablet Take 1 tablet by mouth daily         Allergies:  No Known  Allergies    Problem List:    Patient Active Problem List   Diagnosis Code   . Perforated abdominal viscus R19.8       Past Medical History:  No past medical history on file.    Past Surgical History:  No past surgical history on file.    Social History:    Social History     Tobacco Use   . Smoking status: Not on file   . Smokeless tobacco: Not on file   Substance Use Topics   . Alcohol use: Not on file                                Counseling given: Not Answered      Vital Signs (Current):   Vitals:  05/31/22 2345 06/01/22 0000 06/01/22 0015 06/01/22 0100   BP: (!) 80/42  108/71 (!) 77/45   Pulse: (!) 112 (!) 107 (!) 108 98   Resp: (!) 39 (!) 39 (!) 38 15   Temp:       TempSrc:       SpO2: 91% 91%  93%   Weight:       Height:                                                  BP Readings from Last 3 Encounters:   06/01/22 (!) 77/45       NPO Status:                                                                                 BMI:   Wt Readings from Last 3 Encounters:   05/31/22 102 kg (224 lb 14.4 oz)     Body mass index is 34.2 kg/m.    CBC:   Lab Results   Component Value Date/Time    WBC 16.6 06/01/2022 12:03 AM    RBC 4.66 06/01/2022 12:03 AM    HGB 13.7 06/01/2022 12:03 AM    HCT 42.5 06/01/2022 12:03 AM    MCV 91.2 06/01/2022 12:03 AM    RDW 14.6 06/01/2022 12:03 AM    PLT 60 06/01/2022 12:03 AM       CMP:   Lab Results   Component Value Date/Time    NA 139 06/01/2022 12:03 AM    K 3.9 06/01/2022 12:03 AM    CL 103 06/01/2022 12:03 AM    CO2 19 06/01/2022 12:03 AM    BUN 66 06/01/2022 12:03 AM    CREATININE 4.06 06/01/2022 12:03 AM    CREATININE 4.0 05/31/2022 11:59 PM    LABGLOM 15 06/01/2022 12:03 AM    GLUCOSE 272 06/01/2022 12:03 AM    PROT 6.9 06/01/2022 12:03 AM    CALCIUM 9.1 06/01/2022 12:03 AM    BILITOT 4.8 06/01/2022 12:03 AM    ALKPHOS 162 06/01/2022 12:03 AM    AST 812 06/01/2022 12:03 AM    ALT 658 06/01/2022 12:03 AM       POC Tests:   Recent Labs     05/31/22  2359   POCGLU 271*   POCNA  141   POCK 3.8   POCCL 109*       Coags:   Lab Results   Component Value Date/Time    PROTIME 14.6 06/01/2022 12:03 AM    INR 1.4 06/01/2022 12:03 AM       HCG (If Applicable): No results found for: PREGTESTUR, PREGSERUM, HCG, HCGQUANT     ABGs:   Lab Results   Component Value Date/Time    HCO3ART 15 05/31/2022 11:59 PM        Type & Screen (If Applicable):  No results found for: LABABO, LABRH    Drug/Infectious Status (If Applicable):  No results found for: HIV, HEPCAB    COVID-19 Screening (If Applicable): No results found for:  COVID19        Anesthesia Evaluation  Patient summary reviewed and Nursing notes reviewed no history of anesthetic complications:   Airway: Mallampati: II  TM distance: >3 FB   Neck ROM: full  Mouth opening: > = 3 FB   Dental: normal exam         Pulmonary:normal exam    (+) shortness of breath:                             Cardiovascular:    (+) hypertension:, valvular problems/murmurs: AS, CAD:, hyperlipidemia      ECG reviewed      Echocardiogram reviewed                  Neuro/Psych:   Negative Neuro/Psych ROS              GI/Hepatic/Renal: Neg GI/Hepatic/Renal ROS  (+) renal disease: CRI,           Endo/Other: Negative Endo/Other ROS   (+) DiabetesType II DM, , .                 Abdominal:             Vascular:          Other Findings: Abdominal exam deferred          Anesthesia Plan      general     ASA 4 - emergent       Induction: rapid sequence.  arterial line  MIPS: Postoperative opioids intended, Prophylactic antiemetics administered and Postoperative ventilation.  Anesthetic plan and risks discussed with patient.      Plan discussed with CRNA.    Attending anesthesiologist reviewed and agrees with Preprocedure content                Wende Crease, MD   06/01/2022

## 2022-06-01 NOTE — Progress Notes (Signed)
Bedside and Verbal shift change report Jaime [name] to [name], Rn (oncoming nurse) by Elpidio Galea (offgoing nurse). Report included the following information Nurse Handoff Report, Index, Intake/Output, MAR, and Recent Results.      2000 - Pt assessed. ST on monitor.   Neo - 125  Levo - 20  Vasso - .03    2200 - Pre CVVH BMP labs drawn via A-line. K+ - 6.0   Cre - 5.10     2330 - CVVH initiated. Factor Zero. Fluid neutral goal.      0000- Pt tolerating CVVH well. Pressors @ same as 2000.     0300 - Pt's Right A-Line accidentally removed while turning PT. Manual pressure held above site for hemostasis.     0330 - New a-line on Pt's left side.     0400 - Am labs drawn and sent.     0705 - Pt VT on monitor but sustain BP per a-line and ciff.      0706 - Pt becoming hypotensive  - See other note for details.     0715 - Pt shocked 2x for refractory VT converting into SVT then A-fib. Pt received amiodarone bolus 2x and started on amio Gtt @1 . Pt also Lamonica Trueba 2g Mag sulfate for replete.   Pt transitioned from propofol to versed for sedation. See MAR for details.

## 2022-06-01 NOTE — Interdisciplinary (Signed)
Critical care interdisciplinary rounds today.  Following members present: Case Management, Chaplain, Clinical Lead, Diabetes Treatment, Nursing, Nutrition, Pharmacy, and Physician.   Discussed IV fluids and orders from Renal.  Plan of care discussed.  See clinical pathway for plan of care and interventions and desired outcomes.

## 2022-06-01 NOTE — Progress Notes (Addendum)
Pharmacy Antimicrobial Kinetic Dosing    Indication for Antimicrobials: IAI, sepsis    Current Regimen of Each Antimicrobial:  Vancomycin consult - started 7/31 day 1  Cefepime 2gm IV q12h - started 7/31 day 1  Metronidazole 500mg  IV q8h - started 7/31 day 1  Eraxis 100mg  IV q24h - started 7/31 day 1    Previous Antimicrobial Therapy:  Zosyn 4.5gm IV x1     Goal Level: Vancomycin AUC 400-600    Date Dose & Interval Measured (mcg/mL) Predicted AUC   8/1 12:00 1000mg  IV q24h  random                   Significant Cultures:   7/31 anaerobic -  7/31 Peritoneal fluid -  7/31 BCx -     Labs:  Recent Labs     Units 06/01/22  0618 06/01/22  0551 06/01/22  0204 06/01/22  0003   CREATININE MG/DL  --  06/03/22* 06/03/22* 06/03/22*   BUN MG/DL  --  64* 64* 66*   PROCAL ng/mL  --   --   --  105.82   WBC K/uL 12.5* 14.6*  --  16.6*     Temp (24hrs), Avg:99.1 F (37.3 C), Min:98.7 F (37.1 C), Max:99.7 F (37.6 C)      Conditions for Dosing Consideration:  ARF    Creatinine Clearance (mL/min): Estimated Creatinine Clearance: 20 mL/min (A) (based on SCr of 3.71 mg/dL (H)).     Impression/Plan:     Post rounds update:  Shock liver + ARF, per rounds discussion change to Merrem 1gm IV q12h renal dosing    S/p Exp Lap, endosopy of ileal conduit EGD 7/31 with sm amount of cloudy fluid in RUQ + yellow/white inflammatory peel over stomach and liver, no perf noted or other abd abnormalities  Vancomycin 2500mg  IV loading dose, then repeat with vanc 1000mg  IV 8/1 4am; check random vanc around noon 8/1  Zosyn started pre O.R.; given renal function will change to Cefepime 2gm IV q12h renal dosing plus Metronidazole 50mg  IV q8h  BMP and CBC daily already ordered   Continue Eraxis  Antimicrobial stop date - pending     Pharmacy will follow daily and adjust medications as appropriate for renal function and/or serum levels.    Thank you,  4.40, RPH

## 2022-06-01 NOTE — Plan of Care (Signed)
I spoke to patient's wife (Susie) on phone and updated her that Mr Jaime [name] condition stays critical at this time and still requiring 3 IV drips to support his BP.     All questions answered.   Wife is OK to reveal any medical information to Friend Karren Burly) who is accompanying him here and visiting.

## 2022-06-01 NOTE — Progress Notes (Signed)
 of Irrisept used

## 2022-06-01 NOTE — Anesthesia Post-Procedure Evaluation (Signed)
Department of Anesthesiology  Postprocedure Note    Patient: Jaime [name]  MRN: [id number]  Birthdate: 01-23-1946  Date of evaluation: 06/01/2022      Procedure Summary     Date: 06/01/22 Room / Location: [facility] / [facility]    Anesthesia Start: 0230 Anesthesia Stop: 0553    Procedures:       LAPAROTOMY EXPLORATORY      LAPAROSCOPY DIAGNOSTIC (Abdomen)      ENDOSCOPY OF ILEAL CONDUIT Diagnosis: Intestinal obstruction, unspecified cause, unspecified whether partial or complete (HCC)    Providers: Guinevere Ferrari, MD Responsible Provider: Wende Crease, MD    Anesthesia Type: General ASA Status: 4 - Emergent          Anesthesia Type: General    Aldrete Phase I:      Aldrete Phase II:        Anesthesia Post Evaluation    Patient location during evaluation: PACU  Patient participation: complete - patient cannot participate  Level of consciousness: sedated and ventilated  Complications: no  Cardiovascular status: vasoactive/inotropes  Respiratory status: intubated  Hydration status: stable  Comments: Patient dispo to ICU remaining sedated and ventilated with high pressor requirements  Multimodal analgesia pain management approach

## 2022-06-01 NOTE — Other (Addendum)
06/01/22 0629   Handoff   Communication Given Transfer Handoff   Handoff phase Phase I receiving   Handoff Given To Delanna Ahmadi RN   Handoff Received From OR nurse and CRNA A. Wass   Handoff Communication Face to Face;At bedside   Time Handoff Given 0535   End of Shift Check Performed Yes     TRANSFER - OUT REPORT:    Verbal report given to  [facility] RN  on Jaime [name]  being transferred to CCU for routine post op.     Report consisted of patient's Situation, Background, Assessment and   Recommendations(SBAR).     Information from the following report(s) Nurse Handoff Report, Index, and Surgery Report was reviewed with the receiving nurse.       Transferred to CCU on Neo, Levophed, and Vasopressin gtts.    Lines:   CVC Triple Lumen 06/01/22 (Active)   Central Line Being Utilized Yes 06/01/22 0540   Criteria for Appropriate Use Limited/no vessel suitable for conventional peripheral access 06/01/22 0540   Site Assessment Intact 06/01/22 0540   Phlebitis Assessment No symptoms 06/01/22 0540   Infiltration Assessment 0 06/01/22 0540   Color/Movement/Sensation Capillary refill less than 3 sec 06/01/22 0540   Proximal Lumen Color/Status Infusing 06/01/22 0540   Medial Lumen Status Brown 06/01/22 0540   Distal Lumen Color/Status Infusing 06/01/22 0540   Line Care Connections checked and tightened;Line pulled back 06/01/22 0540   Alcohol Cap Used No 06/01/22 0540   Date of Last Dressing Change 06/01/22 06/01/22 0540   Dressing Type Transparent 06/01/22 0540   Dressing Status Clean, dry & intact 06/01/22 0540       Peripheral IV 05/31/22 Right;Anterior Cephalic (Active)   Site Assessment Clean, dry & intact 06/01/22 0540   Line Status Infusing 06/01/22 0540   Line Care Connections checked and tightened;Line pulled back 06/01/22 0540   Phlebitis Assessment No symptoms 06/01/22 0540   Infiltration Assessment 0 06/01/22 0540   Alcohol Cap Used No 06/01/22 0540   Dressing Status Clean, dry & intact 06/01/22 0540   Dressing  Type Transparent 06/01/22 0540       Peripheral IV 06/01/22 Left Antecubital (Active)   Site Assessment Clean, dry & intact 06/01/22 0540   Line Status Infusing 06/01/22 0540   Line Care Connections checked and tightened;Line pulled back 06/01/22 0540   Phlebitis Assessment No symptoms 06/01/22 0540   Infiltration Assessment 0 06/01/22 0540   Alcohol Cap Used No 06/01/22 0540   Dressing Status Clean, dry & intact 06/01/22 0540   Dressing Type Transparent 06/01/22 0540       Arterial Line 06/01/22 Radial (Active)   Site Assessment Clean, dry & intact 06/01/22 0540   Line Status Blood return noted 06/01/22 0540   Art Line Interventions Connections checked and tightened;Line pulled back 06/01/22 0540   Color/Movement/Sensation Capillary refill less than 3 sec 06/01/22 0540   Dressing Type Transparent 06/01/22 0540   Dressing Status Clean, dry & intact 06/01/22 0540   Dressing Intervention Security device changed 06/01/22 0540       Introducer 06/01/22 (Active)        Opportunity for questions and clarification was provided.      Patient transported with:  Monitor and Registered Nurse

## 2022-06-01 NOTE — Progress Notes (Addendum)
1610- Bedside, Verbal, and Written shift change report given to Darl Pikes RN & Jill Side RN (oncoming nurse) by Ricki Miller (offgoing nurse). Report included the following information SBAR, Kardex, ED Summary, OR Summary, Procedure Summary, Intake/Output, MAR, and Recent Results.      9604- RT at bedside.    5409- Dr. Deirdre Priest at bedside, notified of low urine output.    8119- Neo titrated down, see MAR    0950- Critical care interdisciplinary rounds today.  Following members present: Case Management, Chaplain, Clinical Lead, Diabetes Treatment, Nursing, Nutrition, Pharmacy, and Physician.    1049- Neo titrated down, see MAR    1104- Notified Dr. Allison Quarry of lactic acid result, per MD will continue to monitor.    1125- RT at bedside.    1200- Reassessment performed, see flowsheets.    1219- Dr. Allison Quarry notified of temp of 101.4, no new orders at this time.    1230- Tylenol suppository given for fever per protocol.    1241- Neo titrated down, see MAR    1245- Fentanyl drip started due to increased restlessness.    1300- Bicarb drip started.    1402- Pt temp down to 99.2, clammy skin noted.    1510- RT at bedside.    1530- Dr. Deirdre Priest notified of low UO, no new orders at this time.    1600- Reassessment performed, no changes noted at this time.    1655- Per Dr. Deirdre Priest pt should be started on CRRT due to lab values.    1715- Dr. Allison Quarry spoke with pt's wife to obtain consent for dialysis catheter and CRRT. Consent confirmed by Darl Pikes RN & [facility] RN.    21- Dr. Marcha Dutton at bedside to place dialysis catheter.  1848- Wire out.  1855- Closed.    1925- Bedside, Verbal, and Written shift change report given to [facility] RN (oncoming nurse) by Jill Side RN & Darl Pikes RN (offgoing nurse). Report included the following information SBAR, Kardex, Procedure Summary, Intake/Output, MAR, and Recent Results.      Gus Puma RN

## 2022-06-02 ENCOUNTER — Inpatient Hospital Stay: Admit: 2022-06-02 | Payer: MEDICARE

## 2022-06-02 LAB — BLOOD GAS, ARTERIAL
Base deficit, arterial blood: 9.3 mmol/L
Base deficit, arterial blood: 5.8 mmol/L
Base deficit, arterial blood: 6.3 mmol/L
FIO2 Arterial: 60 %
HCO3, Arterial: 16 mmol/L — ABNORMAL LOW (ref 22–26)
HCO3, Arterial: 19 mmol/L — ABNORMAL LOW (ref 22–26)
HCO3, Arterial: 20 mmol/L — ABNORMAL LOW (ref 22–26)
POC O2 SAT: 94 % (ref 92–97)
POC O2 SAT: 96 % (ref 92–97)
POC O2 SAT: 96 % (ref 92–97)
POC PEEP/CPA: 7
POC TIDAL VOLUME: 500
Set Rate, POC: 20
pCO2, Arterial: 35 mmHg (ref 35–45)
pCO2, Arterial: 39 mmHg (ref 35–45)
pCO2, Arterial: 40 mmHg (ref 35–45)
pH, Arterial: 7.29 — ABNORMAL LOW (ref 7.35–7.45)
pH, Arterial: 7.32 — ABNORMAL LOW (ref 7.35–7.45)
pH, Arterial: 7.32 — ABNORMAL LOW (ref 7.35–7.45)
pO2, Arterial: 78 mmHg — ABNORMAL LOW (ref 80–100)
pO2, Arterial: 84 mmHg (ref 80–100)
pO2, Arterial: 86 mmHg (ref 80–100)

## 2022-06-02 LAB — PATHOLOGIST REVIEW

## 2022-06-02 LAB — BASIC METABOLIC PANEL
Anion Gap: 16 mmol/L — ABNORMAL HIGH (ref 5–15)
Anion Gap: 13 mmol/L (ref 5–15)
Anion Gap: 14 mmol/L (ref 5–15)
Anion Gap: 10 mmol/L (ref 5–15)
Anion Gap: 9 mmol/L (ref 5–15)
BUN: 81 MG/DL — ABNORMAL HIGH (ref 6–20)
BUN: 74 MG/DL — ABNORMAL HIGH (ref 6–20)
BUN: 69 MG/DL — ABNORMAL HIGH (ref 6–20)
BUN: 68 MG/DL — ABNORMAL HIGH (ref 6–20)
BUN: 53 MG/DL — ABNORMAL HIGH (ref 6–20)
Bun/Cre Ratio: 16 (ref 12–20)
Bun/Cre Ratio: 16 (ref 12–20)
Bun/Cre Ratio: 17 (ref 12–20)
Bun/Cre Ratio: 17 (ref 12–20)
Bun/Cre Ratio: 17 (ref 12–20)
CO2: 18 mmol/L — ABNORMAL LOW (ref 21–32)
CO2: 19 mmol/L — ABNORMAL LOW (ref 21–32)
CO2: 20 mmol/L — ABNORMAL LOW (ref 21–32)
CO2: 23 mmol/L (ref 21–32)
CO2: 23 mmol/L (ref 21–32)
Calcium: 7.8 MG/DL — ABNORMAL LOW (ref 8.5–10.1)
Calcium: 8.2 MG/DL — ABNORMAL LOW (ref 8.5–10.1)
Calcium: 8.1 MG/DL — ABNORMAL LOW (ref 8.5–10.1)
Calcium: 8 MG/DL — ABNORMAL LOW (ref 8.5–10.1)
Calcium: 8.4 MG/DL — ABNORMAL LOW (ref 8.5–10.1)
Chloride: 107 mmol/L (ref 97–108)
Chloride: 110 mmol/L — ABNORMAL HIGH (ref 97–108)
Chloride: 106 mmol/L (ref 97–108)
Chloride: 106 mmol/L (ref 97–108)
Chloride: 104 mmol/L (ref 97–108)
Creatinine: 5.1 MG/DL — ABNORMAL HIGH (ref 0.70–1.30)
Creatinine: 4.6 MG/DL — ABNORMAL HIGH (ref 0.70–1.30)
Creatinine: 4.16 MG/DL — ABNORMAL HIGH (ref 0.70–1.30)
Creatinine: 3.93 MG/DL — ABNORMAL HIGH (ref 0.70–1.30)
Creatinine: 3.17 MG/DL — ABNORMAL HIGH (ref 0.70–1.30)
Est, Glom Filt Rate: 11 mL/min/{1.73_m2} — ABNORMAL LOW (ref >60)
Est, Glom Filt Rate: 13 mL/min/{1.73_m2} — ABNORMAL LOW (ref >60)
Est, Glom Filt Rate: 14 mL/min/{1.73_m2} — ABNORMAL LOW (ref >60)
Est, Glom Filt Rate: 15 mL/min/{1.73_m2} — ABNORMAL LOW (ref >60)
Est, Glom Filt Rate: 20 mL/min/{1.73_m2} — ABNORMAL LOW (ref >60)
Glucose: 206 mg/dL — ABNORMAL HIGH (ref 65–100)
Glucose: 186 mg/dL — ABNORMAL HIGH (ref 65–100)
Glucose: 180 mg/dL — ABNORMAL HIGH (ref 65–100)
Glucose: 227 mg/dL — ABNORMAL HIGH (ref 65–100)
Glucose: 212 mg/dL — ABNORMAL HIGH (ref 65–100)
Potassium: 6 mmol/L — ABNORMAL HIGH (ref 3.5–5.1)
Potassium: 5.9 mmol/L — ABNORMAL HIGH (ref 3.5–5.1)
Potassium: 5 mmol/L (ref 3.5–5.1)
Potassium: 4.7 mmol/L (ref 3.5–5.1)
Potassium: 4.2 mmol/L (ref 3.5–5.1)
Sodium: 141 mmol/L (ref 136–145)
Sodium: 142 mmol/L (ref 136–145)
Sodium: 140 mmol/L (ref 136–145)
Sodium: 139 mmol/L (ref 136–145)
Sodium: 136 mmol/L (ref 136–145)

## 2022-06-02 LAB — EKG 12-LEAD
Atrial Rate: 108 {beats}/min
P Axis: 150 degrees
P-R Interval: 148 ms
Q-T Interval: 372 ms
QRS Duration: 76 ms
QTc Calculation (Bazett): 498 ms
R Axis: -27 degrees
T Axis: 151 degrees
Ventricular Rate: 108 {beats}/min

## 2022-06-02 LAB — EXTRA TUBES HOLD

## 2022-06-02 LAB — LACTIC ACID
Lactic Acid, Plasma: 5.7 MMOL/L (ref 0.4–2.0)
Lactic Acid, Plasma: 6.3 MMOL/L (ref 0.4–2.0)
Lactic Acid, Plasma: 8.6 MMOL/L (ref 0.4–2.0)

## 2022-06-02 LAB — PHOSPHORUS
Phosphorus: 5.8 MG/DL — ABNORMAL HIGH (ref 2.6–4.7)
Phosphorus: 4.3 MG/DL (ref 2.6–4.7)

## 2022-06-02 LAB — CBC
Hematocrit: 39.6 % (ref 36.6–50.3)
Hemoglobin: 13 g/dL (ref 12.1–17.0)
MCH: 30.2 PG (ref 26.0–34.0)
MCHC: 32.8 g/dL (ref 30.0–36.5)
MCV: 91.9 FL (ref 80.0–99.0)
MPV: ABNORMAL FL (ref 8.9–12.9)
Nucleated RBCs: 0.1 PER 100 WBC — ABNORMAL HIGH
Platelets: 32 10*3/uL — CL (ref 150–400)
RBC: 4.31 M/uL (ref 4.10–5.70)
RDW: 15.1 % — ABNORMAL HIGH (ref 11.5–14.5)
WBC: 26.8 10*3/uL — ABNORMAL HIGH (ref 4.1–11.1)
nRBC: 0.02 10*3/uL — ABNORMAL HIGH (ref 0.00–0.01)

## 2022-06-02 LAB — CBC WITH AUTO DIFFERENTIAL
Absolute Immature Granulocyte: 0 10*3/uL (ref 0.00–0.04)
Band Neutrophils: 19 %
Basophils %: 0 % (ref 0–1)
Basophils Absolute: 0 10*3/uL (ref 0.0–0.1)
Eosinophils %: 1 % (ref 0–7)
Eosinophils Absolute: 0.2 10*3/uL (ref 0.0–0.4)
Hematocrit: 42.5 % (ref 36.6–50.3)
Hemoglobin: 13.7 g/dL (ref 12.1–17.0)
Immature Granulocytes: 0 % (ref 0.0–0.5)
Lymphocytes %: 8 % — ABNORMAL LOW (ref 12–49)
Lymphocytes Absolute: 1.3 10*3/uL (ref 0.8–3.5)
MCH: 29.4 PG (ref 26.0–34.0)
MCHC: 32.2 g/dL (ref 30.0–36.5)
MCV: 91.2 FL (ref 80.0–99.0)
MPV: ABNORMAL FL (ref 8.9–12.9)
Metamyelocytes: 5 %
Monocytes %: 5 % (ref 5–13)
Monocytes Absolute: 0.8 10*3/uL (ref 0.0–1.0)
Neutrophils %: 62 % (ref 32–75)
Neutrophils Absolute: 13.4 10*3/uL — ABNORMAL HIGH (ref 1.8–8.0)
Nucleated RBCs: 0.1 PER 100 WBC — ABNORMAL HIGH
Platelet Comment: DECREASED
Platelets: 60 10*3/uL — ABNORMAL LOW (ref 150–400)
RBC: 4.66 M/uL (ref 4.10–5.70)
RDW: 14.6 % — ABNORMAL HIGH (ref 11.5–14.5)
WBC: 16.6 10*3/uL — ABNORMAL HIGH (ref 4.1–11.1)
nRBC: 0.02 10*3/uL — ABNORMAL HIGH (ref 0.00–0.01)

## 2022-06-02 LAB — MAGNESIUM
Magnesium: 1.8 mg/dL (ref 1.6–2.4)
Magnesium: 2.1 mg/dL (ref 1.6–2.4)

## 2022-06-02 LAB — COMPREHENSIVE METABOLIC PANEL
ALT: 1754 U/L — ABNORMAL HIGH (ref 12–78)
AST: >2000 U/L — ABNORMAL HIGH (ref 15–37)
Albumin/Globulin Ratio: 0.7 — ABNORMAL LOW (ref 1.1–2.2)
Albumin: 2.3 g/dL — ABNORMAL LOW (ref 3.5–5.0)
Alk Phosphatase: 292 U/L — ABNORMAL HIGH (ref 45–117)
Anion Gap: 13 mmol/L (ref 5–15)
BUN: 68 MG/DL — ABNORMAL HIGH (ref 6–20)
Bun/Cre Ratio: 17 (ref 12–20)
CO2: 20 mmol/L — ABNORMAL LOW (ref 21–32)
Calcium: 8 MG/DL — ABNORMAL LOW (ref 8.5–10.1)
Chloride: 106 mmol/L (ref 97–108)
Creatinine: 4.11 MG/DL — ABNORMAL HIGH (ref 0.70–1.30)
Est, Glom Filt Rate: 14 mL/min/{1.73_m2} — ABNORMAL LOW (ref >60)
Globulin: 3.3 g/dL (ref 2.0–4.0)
Glucose: 178 mg/dL — ABNORMAL HIGH (ref 65–100)
Potassium: 5 mmol/L (ref 3.5–5.1)
Sodium: 139 mmol/L (ref 136–145)
Total Bilirubin: 6.5 MG/DL — ABNORMAL HIGH (ref 0.2–1.0)
Total Protein: 5.6 g/dL — ABNORMAL LOW (ref 6.4–8.2)

## 2022-06-02 LAB — CK: Total CK: 1758 U/L — ABNORMAL HIGH (ref 39–308)

## 2022-06-02 LAB — PROTIME-INR
INR: 2 — ABNORMAL HIGH (ref 0.9–1.1)
Protime: 20 s — ABNORMAL HIGH (ref 9.0–11.1)

## 2022-06-02 LAB — HEMOGLOBIN A1C
Hemoglobin A1C: 7.7 % — ABNORMAL HIGH (ref 4.0–5.6)
eAG: 174 mg/dL

## 2022-06-02 LAB — POCT GLUCOSE
POC Glucose: 178 mg/dL — ABNORMAL HIGH (ref 65–100)
POC Glucose: 205 mg/dL — ABNORMAL HIGH (ref 65–100)
POC Glucose: 203 mg/dL — ABNORMAL HIGH (ref 65–100)

## 2022-06-02 LAB — TROPONIN
Troponin, High Sensitivity: 164 ng/L (ref 0–76)
Troponin, High Sensitivity: 173 ng/L (ref 0–76)
Troponin, High Sensitivity: 162 ng/L (ref 0–76)

## 2022-06-02 LAB — FIBRINOGEN: Fibrinogen: 651 mg/dL — ABNORMAL HIGH (ref 200–475)

## 2022-06-02 LAB — HEPATITIS B SURFACE ANTIBODY
Hep B S Ab: <3.1 m[IU]/mL
Interpretation: NONREACTIVE

## 2022-06-02 LAB — TRIGLYCERIDES
Triglycerides: 574 MG/DL — ABNORMAL HIGH (ref <150)
Triglycerides: 1118 MG/DL — ABNORMAL HIGH (ref <150)

## 2022-06-02 LAB — HEPATITIS B SURFACE ANTIGEN
Hepatitis B Surface Ag: <0.1 Index
Interpretation: NEGATIVE

## 2022-06-02 MED ORDER — SODIUM CHLORIDE 0.9 % IV SOLN
0.9 | INTRAVENOUS | Status: DC
Start: 2022-06-02 — End: 2022-06-05
  Administered 2022-06-03 – 2022-06-05 (×5): 0.03 [IU]/min via INTRAVENOUS

## 2022-06-02 MED ORDER — INSULIN LISPRO 100 UNIT/ML IJ SOLN
100 UNIT/ML | Freq: Three times a day (TID) | INTRAMUSCULAR | Status: DC
Start: 2022-06-02 — End: 2022-06-02

## 2022-06-02 MED ORDER — PERFLUTREN LIPID MICROSPHERE IV SUSP
Freq: Once | INTRAVENOUS | Status: AC | PRN
Start: 2022-06-02 — End: 2022-06-02
  Administered 2022-06-02: 16:00:00 1.5 mL via INTRAVENOUS

## 2022-06-02 MED ORDER — MAGNESIUM SULFATE 2000 MG/50 ML IVPB PREMIX
2 GM/50ML | Freq: Once | INTRAVENOUS | Status: AC
Start: 2022-06-02 — End: 2022-06-02
  Administered 2022-06-02: 11:00:00 2000 mg via INTRAVENOUS

## 2022-06-02 MED ORDER — INSULIN LISPRO 100 UNIT/ML IJ SOLN
100 UNIT/ML | Freq: Every evening | INTRAMUSCULAR | Status: DC
Start: 2022-06-02 — End: 2022-06-02

## 2022-06-02 MED ORDER — NOREPINEPHRINE-SODIUM CHLORIDE 16-0.9 MG/250ML-% IV SOLN
16-0.9 | INTRAVENOUS | Status: DC
Start: 2022-06-02 — End: 2022-06-04
  Administered 2022-06-02 – 2022-06-03 (×3): 35 ug/min via INTRAVENOUS
  Administered 2022-06-03: 05:00:00 45 ug/min via INTRAVENOUS
  Administered 2022-06-04: 02:00:00 18 ug/min via INTRAVENOUS

## 2022-06-02 MED ORDER — VIAL2BAG ADAPTOR (20 MM)
450 | INTRAVENOUS | Status: DC
Start: 2022-06-02 — End: 2022-06-02

## 2022-06-02 MED ORDER — INSULIN LISPRO 100 UNIT/ML IJ SOLN
100 UNIT/ML | Freq: Four times a day (QID) | INTRAMUSCULAR | Status: AC
Start: 2022-06-02 — End: 2022-06-11
  Administered 2022-06-02: 23:00:00 1 [IU] via SUBCUTANEOUS
  Administered 2022-06-02: 17:00:00 2 [IU] via SUBCUTANEOUS
  Administered 2022-06-04 – 2022-06-06 (×9): 1 [IU] via SUBCUTANEOUS
  Administered 2022-06-06: 22:00:00 2 [IU] via SUBCUTANEOUS
  Administered 2022-06-09: 23:00:00 1 [IU] via SUBCUTANEOUS

## 2022-06-02 MED ORDER — DEXTROSE 10 % IV BOLUS
INTRAVENOUS | Status: AC | PRN
Start: 2022-06-02 — End: 2022-08-03

## 2022-06-02 MED ORDER — INSULIN LISPRO 100 UNIT/ML IJ SOLN
100 UNIT/ML | Freq: Every evening | INTRAMUSCULAR | Status: AC
Start: 2022-06-02 — End: 2022-06-05

## 2022-06-02 MED ORDER — DEXTROSE 10 % IV SOLN
10 % | INTRAVENOUS | Status: AC | PRN
Start: 2022-06-02 — End: ?

## 2022-06-02 MED ORDER — AMIODARONE HCL IN DEXTROSE 150-4.21 MG/100ML-% IV SOLN
Freq: Once | INTRAVENOUS | Status: AC
Start: 2022-06-02 — End: 2022-06-02
  Administered 2022-06-02: 12:00:00 150 mg via INTRAVENOUS

## 2022-06-02 MED ORDER — MIDAZOLAM-SODIUM CHLORIDE 100-0.9 MG/100ML-% IV SOLN
100-0.9 | INTRAVENOUS | Status: DC
Start: 2022-06-02 — End: 2022-06-04
  Administered 2022-06-02 – 2022-06-03 (×2): 2 mg/h via INTRAVENOUS

## 2022-06-02 MED ORDER — INSULIN NPH (HUMAN) (ISOPHANE) 100 UNIT/ML SC SUSP
100 UNIT/ML | Freq: Two times a day (BID) | SUBCUTANEOUS | Status: AC
Start: 2022-06-02 — End: 2022-06-04
  Administered 2022-06-02 – 2022-06-04 (×5): 5 [IU] via SUBCUTANEOUS

## 2022-06-02 MED ORDER — VIAL2BAG ADAPTOR (20 MM)
450 | INTRAVENOUS | Status: DC
Start: 2022-06-02 — End: 2022-06-10
  Administered 2022-06-02 (×2): 1 mg/min via INTRAVENOUS
  Administered 2022-06-04 – 2022-06-05 (×4): 0.5 mg/min via INTRAVENOUS
  Administered 2022-06-06 (×2): 1 mg/min via INTRAVENOUS
  Administered 2022-06-07 – 2022-06-10 (×6): 0.5 mg/min via INTRAVENOUS

## 2022-06-02 MED ORDER — FLUDROCORTISONE ACETATE 0.1 MG PO TABS
0.1 | Freq: Every day | ORAL | Status: DC
Start: 2022-06-02 — End: 2022-06-05
  Administered 2022-06-03 – 2022-06-04 (×2): 0.1 mg via ORAL

## 2022-06-02 MED ORDER — AMIODARONE HCL IN DEXTROSE 150-4.21 MG/100ML-% IV SOLN
Freq: Once | INTRAVENOUS | Status: AC
Start: 2022-06-02 — End: 2022-06-02
  Administered 2022-06-02: 17:00:00 150 mg via INTRAVENOUS

## 2022-06-02 MED ORDER — DEXTROSE 10 % IV BOLUS
INTRAVENOUS | Status: DC | PRN
Start: 2022-06-02 — End: 2022-08-03

## 2022-06-02 MED ORDER — GLUCOSE 4 G PO CHEW
4 g | ORAL | Status: AC | PRN
Start: 2022-06-02 — End: ?
  Administered 2022-06-29: 20:00:00 16 via ORAL

## 2022-06-02 MED ORDER — AMIODARONE HCL IN DEXTROSE 150-4.21 MG/100ML-% IV SOLN
150-4.21 | Freq: Once | INTRAVENOUS | Status: DC
Start: 2022-06-02 — End: 2022-06-03

## 2022-06-02 MED ORDER — NOREPINEPHRINE-SODIUM CHLORIDE 16-0.9 MG/250ML-% IV SOLN
INTRAVENOUS | Status: AC
Start: 2022-06-02 — End: 2022-06-02

## 2022-06-02 MED ORDER — AMIODARONE HCL IN DEXTROSE 150-4.21 MG/100ML-% IV SOLN
Freq: Once | INTRAVENOUS | Status: DC
Start: 2022-06-02 — End: 2022-06-02
  Administered 2022-06-02: 11:00:00 150 mg via INTRAVENOUS

## 2022-06-02 MED ORDER — GLUCAGON HCL RDNA (DIAGNOSTIC) 1 MG IJ SOLR
1 MG | INTRAMUSCULAR | Status: AC | PRN
Start: 2022-06-02 — End: ?

## 2022-06-02 MED FILL — FENTANYL CITRATE-NACL 1-0.9 MG/100ML-% IV SOLN: INTRAVENOUS | Qty: 100

## 2022-06-02 MED FILL — PERFLUTREN LIPID MICROSPHERE IV SUSP: INTRAVENOUS | Qty: 2

## 2022-06-02 MED FILL — AMIODARONE HCL 450 MG/9ML IV SOLN: 450 MG/9ML | INTRAVENOUS | Qty: 9

## 2022-06-02 MED FILL — DIPRIVAN 1000 MG/100ML IV EMUL: 1000 MG/100ML | INTRAVENOUS | Qty: 100

## 2022-06-02 MED FILL — VASOPRESSIN 20 UNIT/ML IV SOLN: 20 UNIT/ML | INTRAVENOUS | Qty: 1

## 2022-06-02 MED FILL — NOREPINEPHRINE-SODIUM CHLORIDE 16-0.9 MG/250ML-% IV SOLN: INTRAVENOUS | Qty: 250

## 2022-06-02 MED FILL — HUMULIN N 100 UNIT/ML SC SUSP: 100 UNIT/ML | SUBCUTANEOUS | Qty: 1

## 2022-06-02 MED FILL — PANTOPRAZOLE SODIUM 40 MG IV SOLR: 40 MG | INTRAVENOUS | Qty: 40

## 2022-06-02 MED FILL — VASOSTRICT 20 UNIT/ML IV SOLN: 20 UNIT/ML | INTRAVENOUS | Qty: 1

## 2022-06-02 MED FILL — SOLU-CORTEF 100 MG IJ SOLR: 100 MG | INTRAMUSCULAR | Qty: 2

## 2022-06-02 MED FILL — THIAMINE HCL 100 MG/ML IJ SOLN: 100 MG/ML | INTRAMUSCULAR | Qty: 2

## 2022-06-02 MED FILL — CHLORHEXIDINE GLUCONATE 0.12 % MT SOLN: 0.12 % | OROMUCOSAL | Qty: 15

## 2022-06-02 MED FILL — MAGNESIUM SULFATE 2 GM/50ML IV SOLN: 2 GM/50ML | INTRAVENOUS | Qty: 50

## 2022-06-02 MED FILL — MIDAZOLAM-SODIUM CHLORIDE 100-0.9 MG/100ML-% IV SOLN: INTRAVENOUS | Qty: 100

## 2022-06-02 MED FILL — MERREM 1 G IV SOLR: 1 g | INTRAVENOUS | Qty: 1000

## 2022-06-02 MED FILL — SODIUM CHLORIDE 0.9 % IV SOLN: 0.9 % | INTRAVENOUS | Qty: 250

## 2022-06-02 MED FILL — SODIUM BICARBONATE 8.4 % IV SOLN: 8.4 % | INTRAVENOUS | Qty: 150

## 2022-06-02 MED FILL — ERAXIS 100 MG IV SOLR: 100 MG | INTRAVENOUS | Qty: 30

## 2022-06-02 MED FILL — HUMALOG 100 UNIT/ML IJ SOLN: 100 UNIT/ML | INTRAMUSCULAR | Qty: 1

## 2022-06-02 MED FILL — NEXTERONE 150-4.21 MG/100ML-% IV SOLN: INTRAVENOUS | Qty: 100

## 2022-06-02 MED FILL — NOREPINEPHRINE BITARTRATE 1 MG/ML IV SOLN: 1 MG/ML | INTRAVENOUS | Qty: 16

## 2022-06-02 MED FILL — FLUDROCORTISONE ACETATE 0.1 MG PO TABS: 0.1 MG | ORAL | Qty: 1

## 2022-06-02 NOTE — Interdisciplinary (Signed)
Critical care interdisciplinary rounds today.  Following members present: Case Management, Chaplain, Clinical Lead, Diabetes Treatment, Nursing, Nutrition, Pharmacy, and Physician.   Plan of care discussed.  See clinical pathway for plan of care and interventions and desired outcomes.   Continue broad spectrum antibiotics. Continue CRRT and wean pressors if able.

## 2022-06-02 NOTE — Other (Incomplete)
[name]  [facility]  DIABETES MANAGEMENT CONSULT    Consulted by Provider for advanced nursing evaluation and care for inpatient blood glucose management.    Evaluation and Action Plan   This 76 year old Caucasian male was admitted with RUQ pain and underwent laparotomy 06/01/22. Currently in septic shock with multi-organ failure. On full support. As for BG management in this patient with known Type 2 diabetes, will begin with low dose basal insulin along with corrective insulin to address impact of steroid.    Management Rationale Action Plan   Medication   Overriding steroid effect Use 0.2 units/kg/D based on steroid dose Begin NPH insulin 5 units twice daily   Lab [x]         Hemoglobin A1c   Additional orders             Initial Presentation   Jaime [name] is a 76 y.o. male admitted 05/31/22 from ER after experiencing RUQ pain associated with generalized weakness, nausea & vomiting for several days PTA. AMS+. Hypotension+. Fever+. Dyspneic+. He is visiting from 06/02/22.  Ectopic atrial tachycardia  ER exam:  Cardiovascular:      Rate and Rhythm: Regular rhythm. Tachycardia present.   Pulmonary:      Comments: He is tachypneic, coarse breath sounds on inspiration at bilateral bases  Abdominal:      Palpations: Abdomen is soft.      Comments: Tender to palpation diffusely in the right sided abdomen without guarding or rebound tenderness   LAB: WBC 16.6. Normal H&H. Low platelets.BG 272/AG 17. AKI. Elevated liver enzymes. NT pro-BNP 20,980. Lipase >3000.     CXR:   Right IJ catheter satisfactory position without pneumothorax. ET   tube and NG tube as above   CT Head: Negative  CT ABd:   Extraluminal air bubbles in the right upper quadrant concerning for gastric   perforation. Inflammatory changes are noted about the tail of the pancreas   extending into the left anterior pararenal space, please correlate with any   evidence of acute pancreatitis. Bilateral lower lobe infiltrates.      HX:No past medical history on file.     INITIAL DX: Septicemia (HCC) [A41.9]  Gastric perforation (HCC) [K25.5]  Perforated abdominal viscus [R19.8]     Current Treatment     TX: 06/01/22 LAPAROTOMY EXPLORATORY  LAPAROSCOPY DIAGNOSTIC  ENDOSCOPY OF ILEAL CONDUIT  EGD    Hospital Course   Clinical progress has been complicated by multi-organ failure.   06/01/22 Dialysis  06/02/22 In septic shock with multi-organ failure. On vent support, sedation, pressor support and bicarb infusion. On Abx. Two runs of VT this morning requiring defibrillation; cardiology conversation anticipated. CRRT+    Diabetes History   Type 2 diabetes on metformin therapy    Diabetes-related Medical History deferred    Diabetes Medication History - based on PTA list  Drug class Currently in use   Biguanide [x]  Metformin (Glucophage)  []  Metformin ER (Glucophage XL)     Diabetes self-management practices: deferred  Overall evaluation:    [x]  Unknown A1c     Subjective   NA     Objective   Physical exam  General Obese male who is ill-appearing  Neuro  Sedated  Vital Signs   Vitals:    06/02/22 0900   BP: 114/60   Pulse: (!) 111   Resp: 20   Temp: 99.8 F (37.7 C)   SpO2:      Extremities No foot  wounds    Diabetic foot exam:    Left Foot     Visual Exam:{Foot Inspection:37963::"normal"}   Pulse DP: {Foot Pulse DP 2 (Optional):24344::"Absent"}   Filament test: {EXAM NEUROFILAMENT TEST:37965::"normal sensation"}   {Vibratory sensation:42230::"normal"}  Right Foot   Visual Exam: {Foot Inspection:37963::"normal"}   Pulse DP: {Foot Pulse DP 2 (Optional):24344::"Absent"}   Filament test: {EXAM NEUROFILAMENT TEST:37965::"normal sensation"}   {Vibratory sensation:42230::"normal"}    Laboratory  Recent Labs     06/01/22  0618 06/01/22  1554 06/02/22  0405   WBC 12.5* 16.8* 26.8*   HGB 12.3 13.5 13.0   HCT 37.3 41.5 39.6   MCV 89.4 91.6 91.9   PLT 46* 33* 32*     Recent Labs     06/01/22  0204 06/01/22  0551 06/01/22  0618 06/01/22  1531 06/01/22  2208  06/02/22  0405 06/02/22  0725   NA 139   < >  --    < > 141 139  140 139   K 3.9   < >  --    < > 6.0* 5.0  5.0 4.7   CL 106   < >  --    < > 107 106  106 106   CO2 15*   < >  --    < > 18* 20*  20* 23   PHOS 3.6  --  6.6*  --   --  5.8*  --    BUN 64*   < >  --    < > 81* 68*  69* 68*   CREATININE 3.90*   < >  --    < > 5.10* 4.11*  4.16* 3.93*    < > = values in this interval not displayed.     Lab Results   Component Value Date    ALT 1,754 (H) 06/02/2022    AST >2,000 (H) 06/02/2022    ALKPHOS 292 (H) 06/02/2022    BILITOT 6.5 (H) 06/02/2022     No results found for: TSH, TSHREFLEX, TSHFT4, TSHELE, TSH3GEN, TSHHS   No results found for: LABA1C    Factors impacting BG management  Factor Dose Comments   Nutrition:  NPO       Drugs:  Vasopressor load  Steroids  Blood transfusion(s)  Bicarb in dextrose solution   Levo/Neo/Vasopressin infusions  HC Q6 hrs  None   Affects insulin delivery  Impairs insulin action   Pain Fent & Versed infusions    Infection Eraxis Q24 hrs  Merrem Q12 hrs    Other:   Kidney function  Liver function   AKI  Liver enzymes elevated      Blood glucose pattern      Significant diabetes-related events over the past 24-72 hours  05/31/22 Admission BG 233  06/01/22 Dex 4mg  => BG 100s => HC started  06/02/22 Bgs into 200s    Assessment and Nursing Intervention   Nursing Diagnosis Risk for unstable blood glucose pattern   Nursing Intervention Domain 5250 Decision-making Support   Nursing Interventions Examined current inpatient diabetes/blood glucose control   Explored factors facilitating and impeding inpatient management  Explored corrective strategies with patient and responsible inpatient provider   Informed patient of rational for insulin strategy while hospitalized     Billing Code(s)   []  08/02/22 IP subsequent hospital care - 50 minutes   []  99232 IP subsequent hospital care - 35 minutes   []  99231 IP subsequent hospital care - 25 minutes   [  x] [phone] IP initial hospital care - 40  minutes     Before making these care recommendations, I personally reviewed the hospitalization record, including notes, laboratory & diagnostic data and current medications, and examined the patient at the bedside (circumstances permitting) before determining care. More than fifty (50) percent of the time was spent in patient counseling and/or care coordination.  Total minutes: 40    Bess Harvest, APRN - CNS  Clinical Nurse Specialist - Diabetes & endocrine disorders  [facility]  Access via [employer]

## 2022-06-02 NOTE — Progress Notes (Signed)
1700   Lactic 5.7  Troponin 162  Will continue to follow lab orders

## 2022-06-02 NOTE — Other (Signed)
06/02/22 1300   Observations & Evaluations   Level of Consciousness 2   Respiratory Quality/Effort Other (Comment)  (intubated)   O2 Device Ventilator   RUE Edema +1;Pitting   LUE Edema +1;Pitting   RLE Edema +1;Pitting   LLE Edema +1;Pitting   Vital Signs   BP 119/60   Temp 99.4 F (37.4 C)   Pulse (!) 118   Respirations 20   SpO2 96 %   During Hemodialysis Assessment   ABP (Arterial line BP) 99/41        06/02/22 1300   Treatment   $CRRT $Yes   Machine #   ([id number])   Pressures   Access (mmHg) -99 mmHg   Return/Venous (mmHg) 147 mmHg   Effluent (mmHg) 174 mmHg   Filter (mmHg) 267 mmHg   TMP Pressure (mmHg) 28 mmHg   Pressure Drop (mmHg) 67 mmHg   Deaeration Chamber Check Yes   Flow Rates   Blood Flow (mL/min) 250 mL/min   Dialysate (mL/hr) 2550 mL/hr   Non-CRRT Intake   IV/IVPB (mL) 322 mL   TPN (mL) 0 mL   Transfuse RBC (mL) - Volume Infused 0 mL   Transfuse Platelets (mL) 0 mL   PO (mL) 0   Tube Feeding (mL) 0 mL   Free Water/Flush (mL) 0 mL   Heparin 0   Citrate 0   Calcium Chloride 0   NxStage flushes (mL) 0 mL   Other (mL) 0   Boluses (mL) Added To Total 0 mL   Boluses (mL) Not Added To Total 0 mL   Total Non-CRRT Intake (Calculated) 322   Non-CRRT Output   Urine (mL) 0   Stool (mL) 0   NG/OG (mL) 0 mL   Emesis (mL) 0   Wound Drain (mL) 25 mL   Chest Tubes (mL) 0 mL   Foley (mL) 0 mL   Other (mL) 0   Total Non-CRRT Output  (Calculated) 25 mL   System Used   System Used [employer] Calculation   (A) Total Non-NxStage Intake (mL) 322 mL   (C) Total Non-NxStage Output (mL) 25 mL/hr   Prisma Calculation   (A) Total Non-Prisma Intake (mL) 322 mL   (B) Total Non-Prisma Output (mL) 25 mL   (C) Balance (A-B) 297 ml   (D) Physician Ordered Hourly Removal (mL) 0 ml   (E) Amount of UF removed last hour 215 ml   (F) Amount Programmed to Remove (H from last hour) 215   (G) Amount ahead or behind (E-F) 0   (H) Set the removal rate for the next hour (C+D-G) 297   (I) Actual Fluid Balance (C-E) 82   Hemodialysis Central  Access Right Femoral vein   Placement Date/Time: 06/01/22 1830   Present on Admission/Arrival: No  Inserted by: Dr. Christel Mormon  Insertion Practices: Chlorohexadine skin antisepsis;Maximal barrier precautions;Sterile ultrasound technique;Optimal catheter site selection;Betadine...   Continued need for line? Yes   Site Assessment Clean, dry & intact   Venous Lumen Status Infusing   Arterial Lumen Status Infusing   Alcohol Cap Used No   Line Care Connections checked and tightened   Dressing Type Transparent;Bacteriocidal   Date of Last Dressing Change 06/01/22   Dressing Status Clean, dry & intact   Primary RN SBAR: Ruthine Dose, RN  Patient Education: with Primary RN for rinse back procedure; instrutions on back of machine as well  Hepatitis B Surface Ag   Date/Time Value Ref Range Status  06/01/2022 10:08 PM <0.10 Index Final     Hep B S Ab   Date/Time Value Ref Range Status   06/01/2022 10:08 PM <3.10 mIU/mL Final

## 2022-06-02 NOTE — Progress Notes (Signed)
Updated patients wife, Susie Mooneyham,on patients condition. Wife is requesting that patients wallet be sent home with patients friend, Almon Register. Wallet given to Almon Register per patients wife request.

## 2022-06-02 NOTE — Progress Notes (Addendum)
0740:  Received bedside report from [place], RN.    0800:  Assessment complete, see flowsheet.     0915:  Dr. Cordie Grice at bedside.    0945:  Dr. Allison Quarry at bedside.     1100:  Reassessment complete.  Foley catheter removed.     1200:  Labs drawn from A-line.     12.32:  Patient's heartrate fluctuates between 120-160's.  Continues to have burst of Vtach, Dr. Fenton Malling notified and no new orders received.      1242:  Patient in Vtach HR up to 180, called Dr. Allison Quarry to bedside, received order for Amio 150mg  bolus now.     1255:  Amio bolus complete, EKG performed.     1500:  Reassessment complete, see flowsheet.    1615:  Labs drawn from A-line and sent to lab.

## 2022-06-02 NOTE — Plan of Care (Signed)
I have updated patient wife this afternoon that Jaime [name] is getting worse and we might not be able to save him in long run.   I have offered wife to visit [facility] if she is able to as likelihood is high that patient will get worse in next 24 hours and might not survive.   Meanwhile, patient stays full code and will continue supportive treatment.

## 2022-06-02 NOTE — Consults (Signed)
EP/ ARRHYTHMIA/ CARDIOLOGY CONSULT secondary to VT    Patient ID:  Patient: Jaime [name]  MRN: [id number]  Age: 76 y.o.  DOB: May 09, 1946    Date of  Admission: 05/31/2022 11:25 PM   PCP:  No primary care provider on file.  Usual cardiologist:  Jodelle Red, MD in [facility] [phone]    Assessment:   Sustained monomorphic VT but also with some salvoes earlier today.  Suspect this is ectopic and due to critical illness and catecholamines.  Persistent atrial fibrillation.  Moderate aortic stenosis by outside echo 2020, limited study done here on 8/1.  Sepsis with GNR.  Diagnostic laparoscopy, laparotomy, EGD on 7/31.  Multiorgan dysfunction, shock.  Full code.    Plan:     Continue amiodarone infusion.  Can bolus freely as needed.  If an additional agent is needed, consider lidocaine bolus.  Reviewed the ECG's and HS troponins with the intensivist--continue the course.  Not a ST ELEVATION MYOCARDIAL INFARCTION.    Poor prognosis.      [x]        High complexity decision making was performed in this patient at high risk for decompensation with multiple organ involvement.    Jaime [name] is a 76 y.o. male with a history of the above.  I was asked to consult due to recurrent VT requiring repeated amiodarone and shock attempts.     No past medical history on file.     Past Surgical History:   Procedure Laterality Date    BLADDER SURGERY N/A 06/01/2022    ENDOSCOPY OF ILEAL CONDUIT performed by 06/03/2022, MD at [facility] OR    LAPAROSCOPY N/A 06/01/2022    LAPAROSCOPY DIAGNOSTIC performed by 06/03/2022, MD at [facility]    LAPAROTOMY N/A 06/01/2022    LAPAROTOMY EXPLORATORY performed by 06/03/2022, MD at [facility]       Social History     Tobacco Use    Smoking status: Not on file    Smokeless tobacco: Not on file   Substance Use Topics    Alcohol use: Not on file        No family history on file.     Allergies   Allergen Reactions    Augmentin [Amoxicillin-Pot  Clavulanate] Hives    Codeine      Unknown reaction          Current Facility-Administered Medications   Medication Dose Route Frequency    amiodarone (NEXTERONE) 150 mg in dextrose 5% 100 ml  150 mg IntraVENous Once    amiodarone (CORDARONE) 450 mg in dextrose 5 % 250 mL infusion (Vial2Bag)  1 mg/min IntraVENous Continuous    midazolam (VERSED) 100mg /161mL in NS infusion  1-10 mg/hr IntraVENous Continuous    fludrocortisone (FLORINEF) tablet 0.1 mg  0.1 mg Oral Daily    glucose chewable tablet 16 g  4 tablet Oral PRN    dextrose bolus 10% 125 mL  125 mL IntraVENous PRN    Or    dextrose bolus 10% 250 mL  250 mL IntraVENous PRN    glucagon (rDNA) injection 1 mg  1 mg SubCUTAneous PRN    dextrose 10 % infusion   IntraVENous Continuous PRN    insulin NPH (HumuLIN N;NovoLIN N) injection vial 5 Units  5 Units SubCUTAneous 2 times per day    insulin lispro (HUMALOG) injection vial 0-4 Units  0-4 Units SubCUTAneous 4 times per day    insulin lispro (HUMALOG) injection vial 0-4 Units  0-4 Units SubCUTAneous Nightly    norepinephrine (LEVOPHED) 16 mg in sodium chloride 0.9 % 250 mL infusion  1-100 mcg/min IntraVENous Continuous    vasopressin 20 Units in sodium chloride 0.9 % 100 mL infusion  0.03 Units/min IntraVENous Continuous    sodium chloride flush 0.9 % injection 5-40 mL  5-40 mL IntraVENous 2 times per day    sodium chloride flush 0.9 % injection 5-40 mL  5-40 mL IntraVENous PRN    0.9 % sodium chloride infusion   IntraVENous PRN    acetaminophen (TYLENOL) tablet 650 mg  650 mg Oral Q6H PRN    Or    acetaminophen (TYLENOL) suppository 650 mg  650 mg Rectal Q6H PRN    ondansetron (ZOFRAN) injection 4 mg  4 mg IntraVENous Q6H PRN    chlorhexidine (PERIDEX) 0.12 % solution 15 mL  15 mL Mouth/Throat BID    pantoprazole (PROTONIX) 40 mg in sodium chloride (PF) 0.9 % 10 mL injection  40 mg IntraVENous Q12H    hydrocortisone sodium succinate PF (SOLU-CORTEF) injection 50 mg  50 mg IntraVENous Q6H    anidulafungin (ERAXIS)  100 mg in sodium chloride 0.9 % 130 mL IVPB  100 mg IntraVENous Q24H    fentanyl (SUBLIMAZE) infusion 1000 mcg/136mL  25-200 mcg/hr IntraVENous Continuous    meropenem (MERREM) 1,000 mg in sodium chloride 0.9 % 100 mL IVPB (mini-bag)  1,000 mg IntraVENous Q12H    phenylephrine (NEO-SYNEPHRINE) 50 mg in sodium chloride 0.9 % 250 mL infusion (Vial2Bag)  10-300 mcg/min IntraVENous Continuous    thiamine (B-1) 200 mg in sodium chloride 0.9 % 100 mL IVPB  200 mg IntraVENous BID    prismaSol BGK 2/3.5 dialysis solution   Dialysis Continuous       Review of Symptoms:  He cannot communicate.     Objective:      Physical Exam:  Temp (24hrs), Avg:99.8 F (37.7 C), Min:99.1 F (37.3 C), Max:100.6 F (38.1 C)    Patient Vitals for the past 8 hrs:   Pulse   06/02/22 2000 (!) 110   06/02/22 1928 (!) 112   06/02/22 1800 (!) 121   06/02/22 1700 (!) 129   06/02/22 1600 (!) 124   06/02/22 1515 (!) 121   06/02/22 1500 (!) 126   06/02/22 1400 (!) 121   06/02/22 1300 (!) 118    Patient Vitals for the past 8 hrs:   Resp   06/02/22 2000 20   06/02/22 1928 20   06/02/22 1700 20   06/02/22 1600 20   06/02/22 1515 20   06/02/22 1500 20   06/02/22 1400 20   06/02/22 1300 20    Patient Vitals for the past 8 hrs:   BP   06/02/22 2000 (!) 103/43   06/02/22 1800 (!) 152/89   06/02/22 1700 (!) 132/95   06/02/22 1600 (!) 145/92   06/02/22 1500 120/60   06/02/22 1400 121/88   06/02/22 1300 119/60        Intake/Output Summary (Last 24 hours) at 06/02/2022 2016  Last data filed at 06/02/2022 2001  Gross per 24 hour   Intake 4649.37 ml   Output 4224 ml   Net 425.37 ml       Nondiaphoretic, not in acute distress.  Intubated and sedated.  No scleral icterus, mucous membranes moist, conjuctivae pink, no xanthelasma.  Unlabored, clear to auscultation bilaterally anteriorly, symmetric air movement.  Irregular rate and rhythm, + soft systolic murmur, no pericardial rub.  No  peripheral edema.  Extremities without cyanosis or clubbing.  Muscle tone and bulk  normal.  Skin warm and dry.  No rashes or ulcers visible.  Neuro grossly nonfocal.  No tremor.  Awake and appropriate.    CARDIOGRAPHICS and STUDIES, I reviewed:    Telemetry:  Atrial fibrillation.    ECG:  Studies reviewed from the last 24 hours.    Echo 8/1:  Left Ventricle Normal left ventricular systolic function with a visually estimated EF of 55 - 60%. Not well visualized. Left ventricle size is normal. Increased wall thickness. Unable to assess wall motion. Diastolic dysfunction present with normal LV EF.   Left Atrium Not well visualized.   Right Ventricle Not well visualized. Normal systolic function.   Right Atrium Not well visualized.   Aortic Valve Not well visualized. No regurgitation. No stenosis.   Mitral Valve Not well visualized. No regurgitation. No stenosis noted.   Tricuspid Valve Not well visualized. No regurgitation. No stenosis noted.   Pulmonic Valve The pulmonic valve was not well visualized.   Aorta Not well visualized. Normal sized sinus of Valsalva.   Pericardium No pericardial effusion.       Labs:  No results for input(s): CPK, CKMB in the last 72 hours.    Invalid input(s): CPKMB, CKNDX, TROIQ  No results found for: CHOL, CHOLX, CHLST, CHOLV, HDL, HDLC, LDL, LDLC, TGLX, TRIGL  Recent Labs     06/01/22  0003 06/02/22  0405   INR 1.4* 2.0*      Recent Labs     06/01/22  0618 06/01/22  1531 06/01/22  1554 06/01/22  2208 06/02/22  0405 06/02/22  0725 06/02/22  1619   NA  --    < >  --    < > 139  140 139 136   K  --    < >  --    < > 5.0  5.0 4.7 4.2   CL  --    < >  --    < > 106  106 106 104   CO2  --    < >  --    < > 20*  20* 23 23   BUN  --    < >  --    < > 68*  69* 68* 53*   PHOS 6.6*  --   --   --  5.8*  --  4.3   WBC 12.5*  --  16.8*  --  26.8*  --   --    HGB 12.3  --  13.5  --  13.0  --   --    HCT 37.3  --  41.5  --  39.6  --   --    PLT 46*  --  33*  --  32*  --   --     < > = values in this interval not displayed.     Recent Labs     06/01/22  0003 06/01/22  0618  06/02/22  0405   GLOB 3.7 2.4 3.3     No components found for: [facility]  No results for input(s): PH, PCO2, PO2 in the last 72 hours.      Tamera Punt, MD 06/02/22 8:16 PM

## 2022-06-02 NOTE — Progress Notes (Addendum)
0708: Defibrillated. VT on monitor.    7106: Defibrillated with VT/Sinus Tach.     Ordered ABG, LA, BMP, Trop    Bolusing with Amio; followed by gtt

## 2022-06-02 NOTE — Progress Notes (Signed)
SURGERY PROGRESS NOTE      Admit Date: 05/31/2022    POD 1 Day Post-Op    Procedure: Procedure(s):  LAPAROTOMY EXPLORATORY  LAPAROSCOPY DIAGNOSTIC  ENDOSCOPY OF ILEAL CONDUIT      Subjective:     Patient had an episode of Vtach that responded to defibrillation tis am.        Objective:     BP 105/75   Pulse (!) 119   Temp 99.3 F (37.4 C) (Axillary)   Resp 21   Ht 1.727 m (5\' 8" )   Wt 102 kg (224 lb 14.4 oz)   SpO2 95%   BMI 34.20 kg/m      Temp (24hrs), Avg:99.6 F (37.6 C), Min:98.3 F (36.8 C), Max:101.4 F (38.6 C)      08/01 0701 - 08/01 1900  In: -   Out: 79 [Urine:3]  07/30 1901 - 08/01 0700  In: 8023.8 [I.V.:7540.5]  Out: 2143 [Urine:72; Drains:948]    Physical Exam:    General:  Intubated and sedated    Abdomen: Soft, tender.  Hypoactive bowel sounds Jps 900cc in 24 hours serosanguinous    Incision:   Dressing C/D/I         CBC:   Lab Results   Component Value Date/Time    WBC 26.8 06/02/2022 04:05 AM    RBC 4.31 06/02/2022 04:05 AM    HGB 13.0 06/02/2022 04:05 AM    HCT 39.6 06/02/2022 04:05 AM    MCV 91.9 06/02/2022 04:05 AM    MCH 30.2 06/02/2022 04:05 AM    MCHC 32.8 06/02/2022 04:05 AM    RDW 15.1 06/02/2022 04:05 AM    PLT 32 06/02/2022 04:05 AM    MPV ABNORMAL 06/02/2022 04:05 AM     BMP:    Lab Results   Component Value Date/Time    NA 139 06/02/2022 07:25 AM    K 4.7 06/02/2022 07:25 AM    CL 106 06/02/2022 07:25 AM    CO2 23 06/02/2022 07:25 AM    BUN 68 06/02/2022 07:25 AM    LABALBU 2.3 06/02/2022 04:05 AM    CREATININE 3.93 06/02/2022 07:25 AM    CALCIUM 8.0 06/02/2022 07:25 AM    LABGLOM 15 06/02/2022 07:25 AM    GLUCOSE 227 06/02/2022 07:25 AM         Assessment:     Principal Problem:    Perforated abdominal viscus  Resolved Problems:    * No resolved hospital problems. *    76 year admitted with sepsis from pneumoperitoneum.  Negative ex-lap and endoscopy ? Sealed perforation vs translocation.  Has gram negative bacteremia.  In multiorgan failure on 3 pressors and RRT having  episodes of Vtach.  Prognosis poor.       Plan:     Continue supportive care

## 2022-06-02 NOTE — Progress Notes (Signed)
Bedside and Verbal shift change report Jaime [name] to Jaime Frizzle, Rn (Cabin crew) by Desmond Dike  (offgoing nurse). Report included the following information Index, Intake/Output, MAR, and Recent Results.    2000 - PT in afib RVR, frequent PVCs and nun-sustained runs of VT. Pressors unable to be weaned at this time d/t hypotension. Amio @1      2100 - CVVH UF titrated down d/t bouts of hypotension. See MAR details of pressor titration.     0140 - Pt converted into a sinus brady arrhythmia. NP notified. EKG obtained and reviewed.     0200 - Pt more hemodynamically stable w/ rhythm change. See MAR for pressor titration details.     35 - Able to wean Pt off Neo. BP's WNL both Art-line and cuff pressor     0700 - ST changes noted on bedside ECG by day RN. Pt repositioned and resting at this time.

## 2022-06-02 NOTE — Other (Signed)
[name]  [facility]  DIABETES MANAGEMENT CONSULT    Consulted by Provider for advanced nursing evaluation and care for inpatient blood glucose management.    Evaluation and Action Plan   This 76 year old Caucasian male was admitted with RUQ pain and underwent laparotomy 06/01/22. Currently in septic shock with multi-organ failure. On full support. As for BG management in this patient with known Type 2 diabetes, will begin with low dose basal insulin along with corrective insulin to address impact of steroid.    Management Rationale Action Plan   Medication   Overriding steroid effect Use 0.2 units/kg/D based on steroid dose Begin NPH insulin 5 units twice daily   Lab [x]         Hemoglobin A1c   Additional orders             Initial Presentation   Jaime [name] is a 76 y.o. male admitted 05/31/22 from ER after experiencing RUQ pain associated with generalized weakness, nausea & vomiting for several days PTA. AMS+. Hypotension+. Fever+. Dyspneic+. He is visiting from 06/02/22.  Ectopic atrial tachycardia  ER exam:  Cardiovascular:      Rate and Rhythm: Regular rhythm. Tachycardia present.   Pulmonary:      Comments: He is tachypneic, coarse breath sounds on inspiration at bilateral bases  Abdominal:      Palpations: Abdomen is soft.      Comments: Tender to palpation diffusely in the right sided abdomen without guarding or rebound tenderness   LAB: WBC 16.6. Normal H&H. Low platelets.BG 272/AG 17. AKI. Elevated liver enzymes. NT pro-BNP 20,980. Lipase >3000.     CXR:   Right IJ catheter satisfactory position without pneumothorax. ET   tube and NG tube as above   CT Head: Negative  CT ABd:   Extraluminal air bubbles in the right upper quadrant concerning for gastric   perforation. Inflammatory changes are noted about the tail of the pancreas   extending into the left anterior pararenal space, please correlate with any   evidence of acute pancreatitis. Bilateral lower lobe infiltrates.      HX:No past medical history on file.     INITIAL DX: Septicemia (HCC) [A41.9]  Gastric perforation (HCC) [K25.5]  Perforated abdominal viscus [R19.8]     Current Treatment     TX: 06/01/22 LAPAROTOMY EXPLORATORY  LAPAROSCOPY DIAGNOSTIC  ENDOSCOPY OF ILEAL CONDUIT  EGD    Hospital Course   Clinical progress has been complicated by multi-organ failure.   06/01/22 Dialysis  06/02/22 In septic shock with multi-organ failure. On vent support, sedation, pressor support and bicarb infusion. On Abx. Two runs of VT this morning requiring defibrillation; cardiology conversation anticipated. CRRT+    Diabetes History   Type 2 diabetes on metformin therapy    Diabetes-related Medical History deferred    Diabetes Medication History - based on PTA list  Drug class Currently in use   Biguanide [x]  Metformin (Glucophage)  []  Metformin ER (Glucophage XL)     Diabetes self-management practices: deferred  Overall evaluation:    [x]  Unknown A1c     Subjective   NA     Objective   Physical exam  General Obese male who is ill-appearing  Neuro  Sedated  Vital Signs   Vitals:    06/02/22 0900   BP: 114/60   Pulse: (!) 111   Resp: 20   Temp: 99.8 F (37.7 C)   SpO2:      Extremities No foot  wounds    Diabetic foot exam:    Left Foot     Visual Exam: Cool to touch. Thickened great and second toenails    Pulse DP: +1   Right Foot   Visual Exam: Cool to touch. Thickened great toenail    Pulse DP: +1     Laboratory  Recent Labs     06/01/22  0618 06/01/22  1554 06/02/22  0405   WBC 12.5* 16.8* 26.8*   HGB 12.3 13.5 13.0   HCT 37.3 41.5 39.6   MCV 89.4 91.6 91.9   PLT 46* 33* 32*       Recent Labs     06/01/22  0204 06/01/22  0551 06/01/22  0618 06/01/22  1531 06/01/22  2208 06/02/22  0405 06/02/22  0725   NA 139   < >  --    < > 141 139  140 139   K 3.9   < >  --    < > 6.0* 5.0  5.0 4.7   CL 106   < >  --    < > 107 106  106 106   CO2 15*   < >  --    < > 18* 20*  20* 23   PHOS 3.6  --  6.6*  --   --  5.8*  --    BUN 64*   < >  --    < >  81* 68*  69* 68*   CREATININE 3.90*   < >  --    < > 5.10* 4.11*  4.16* 3.93*    < > = values in this interval not displayed.       Lab Results   Component Value Date    ALT 1,754 (H) 06/02/2022    AST >2,000 (H) 06/02/2022    ALKPHOS 292 (H) 06/02/2022    BILITOT 6.5 (H) 06/02/2022     No results found for: TSH, TSHREFLEX, TSHFT4, TSHELE, TSH3GEN, TSHHS   No results found for: LABA1C    Factors impacting BG management  Factor Dose Comments   Nutrition:  NPO       Drugs:  Vasopressor load  Steroids  Blood transfusion(s)  Bicarb in dextrose solution   Levo/Neo/Vasopressin infusions  HC Q6 hrs  None   Affects insulin delivery  Impairs insulin action   Pain Fent & Versed infusions    Infection Eraxis Q24 hrs  Merrem Q12 hrs WBC elevated. Fever in last 24 hrs   Other:   Kidney function  Liver function   AKI  Liver enzymes elevated      Blood glucose pattern      Significant diabetes-related events over the past 24-72 hours  05/31/22 Admission BG 233  06/01/22 Dex 4mg  => BG 100s => HC started  06/02/22 Bgs into 200s    Assessment and Nursing Intervention   Nursing Diagnosis Risk for unstable blood glucose pattern   Nursing Intervention Domain 5250 Decision-making Support   Nursing Interventions Examined current inpatient diabetes/blood glucose control   Explored factors facilitating and impeding inpatient management  Explored corrective strategies with patient and responsible inpatient provider   Informed patient of rational for insulin strategy while hospitalized     Billing Code(s)   []  99233 IP subsequent hospital care - 50 minutes   []  99232 IP subsequent hospital care - 35 minutes   []  99231 IP subsequent hospital care - 25 minutes   [x]  99221 IP initial hospital care -  40 minutes     Before making these care recommendations, I personally reviewed the hospitalization record, including notes, laboratory & diagnostic data and current medications, and examined the patient at the bedside (circumstances permitting) before  determining care. More than fifty (50) percent of the time was spent in patient counseling and/or care coordination.  Total minutes: 40    Bess Harvest, APRN - CNS  Clinical Nurse Specialist - Diabetes & endocrine disorders  [facility]  Access via [employer]

## 2022-06-02 NOTE — Progress Notes (Signed)
NAME: Jaime [name]        DOB:  1946-01-21        MRN:  [id number]                     Assessment   :                                               Plan:  AKI  Sepsis  Hypotension  HTN  DM       AKI related to sepsis and shock. CT showed renal cysts.  He has poor UO.      CVVHD started 7/31.      Continue with factor 0.  2K bags.     Continue sodium bicarbonate IV.     Continue pressors for BP support.     Patient is critically ill and at significant risk of deterioration.        DW RN.            Subjective:     Chief Complaint:  intubated.    Review of Systems:    Symptom Y/N Comments  Symptom Y/N Comments   Fever/Chills    Chest Pain     Poor Appetite    Edema     Cough    Abdominal Pain     Sputum    Joint Pain     SOB/DOE    Pruritis/Rash     Nausea/vomit    Tolerating PT/OT     Diarrhea    Tolerating Diet     Constipation    Other       Could not obtain due to:      Objective:     VITALS:   Last 24hrs VS reviewed since prior progress note. Most recent are:  Vitals:    06/02/22 0900   BP: 114/60   Pulse: (!) 111   Resp: 20   Temp: 99.8 F (37.7 C)   SpO2:        Intake/Output Summary (Last 24 hours) at 06/02/2022 1018  Last data filed at 06/02/2022 1000  Gross per 24 hour   Intake 4135.35 ml   Output 2500 ml   Net 1635.35 ml      Telemetry Reviewed:     PHYSICAL EXAM:  General: Intubated  +edema      Lab Data Reviewed: (see below)    Medications Reviewed: (see below)    PMH/SH reviewed - no change compared to H&P  ________________________________________________________________________  Care Plan discussed with:  Patient     Family      RN     Care Manager                    Consultant:          Comments   >50% of visit spent in counseling and coordination of care       ________________________________________________________________________  Marina Gravel, MD     Procedures: see electronic medical records for all procedures/Xrays  and details which  were not copied into this note but were reviewed prior to creation of Plan.      LABS:  Recent Labs     06/01/22  1554 06/02/22  0405   WBC 16.8* 26.8*   HGB 13.5 13.0   HCT 41.5 39.6   PLT 33* 32*  Recent Labs     06/01/22  0204 06/01/22  0551 06/01/22  0618 06/01/22  1531 06/01/22  2208 06/02/22  0405 06/02/22  0725   NA 139   < >  --    < > 141 139  140 139   K 3.9   < >  --    < > 6.0* 5.0  5.0 4.7   CL 106   < >  --    < > 107 106  106 106   CO2 15*   < >  --    < > 18* 20*  20* 23   BUN 64*   < >  --    < > 81* 68*  69* 68*   MG 1.5*  --  1.5*  --   --  1.8  --    PHOS 3.6  --  6.6*  --   --  5.8*  --     < > = values in this interval not displayed.     Recent Labs     06/01/22  0003 06/01/22  0618 06/02/22  0405   GLOB 3.7 2.4 3.3     Recent Labs     06/01/22  0003 06/02/22  0405   INR 1.4* 2.0*      No results for input(s): TIBC, FERR in the last 72 hours.    Invalid input(s): FE, PSAT   No results found for: FOL, RBCF   No results for input(s): PH, PCO2, PO2 in the last 72 hours.  No results for input(s): CPK, CKMB, TROPONINI in the last 72 hours.  No components found for: [facility]  @labua @    MEDICATIONS:  Current Facility-Administered Medications   Medication Dose Route Frequency    amiodarone (NEXTERONE) 150 mg in dextrose 5% 100 ml  150 mg IntraVENous Once    amiodarone (CORDARONE) 450 mg in dextrose 5 % 250 mL infusion (Vial2Bag)  1 mg/min IntraVENous Continuous    midazolam (VERSED) 100mg /173mL in NS infusion  1-10 mg/hr IntraVENous Continuous    norepinephrine-sodium chloride (LEVOPHED) 16-0.9 MG/250ML-% infusion        fludrocortisone (FLORINEF) tablet 0.1 mg  0.1 mg Oral Daily    glucose chewable tablet 16 g  4 tablet Oral PRN    dextrose bolus 10% 125 mL  125 mL IntraVENous PRN    Or    dextrose bolus 10% 250 mL  250 mL IntraVENous PRN    glucagon (rDNA) injection 1 mg  1 mg SubCUTAneous PRN    dextrose 10 % infusion   IntraVENous Continuous PRN    insulin NPH (HumuLIN  N;NovoLIN N) injection vial 5 Units  5 Units SubCUTAneous 2 times per day    insulin lispro (HUMALOG) injection vial 0-4 Units  0-4 Units SubCUTAneous 4 times per day    insulin lispro (HUMALOG) injection vial 0-4 Units  0-4 Units SubCUTAneous Nightly    sodium chloride flush 0.9 % injection 5-40 mL  5-40 mL IntraVENous 2 times per day    sodium chloride flush 0.9 % injection 5-40 mL  5-40 mL IntraVENous PRN    0.9 % sodium chloride infusion   IntraVENous PRN    acetaminophen (TYLENOL) tablet 650 mg  650 mg Oral Q6H PRN    Or    acetaminophen (TYLENOL) suppository 650 mg  650 mg Rectal Q6H PRN    ondansetron (ZOFRAN) injection 4 mg  4 mg IntraVENous Q6H PRN    propofol infusion  5-50  mcg/kg/min IntraVENous Continuous    norepinephrine (LEVOPHED) 16 mg in sodium chloride 0.9 % 250 mL infusion  1-100 mcg/min IntraVENous Continuous    vasopressin 20 Units in sodium chloride 0.9 % 100 mL infusion  0.03 Units/min IntraVENous Continuous    chlorhexidine (PERIDEX) 0.12 % solution 15 mL  15 mL Mouth/Throat BID    pantoprazole (PROTONIX) 40 mg in sodium chloride (PF) 0.9 % 10 mL injection  40 mg IntraVENous Q12H    hydrocortisone sodium succinate PF (SOLU-CORTEF) injection 50 mg  50 mg IntraVENous Q6H    anidulafungin (ERAXIS) 100 mg in sodium chloride 0.9 % 130 mL IVPB  100 mg IntraVENous Q24H    fentanyl (SUBLIMAZE) infusion 1000 mcg/140mL  25-200 mcg/hr IntraVENous Continuous    meropenem (MERREM) 1,000 mg in sodium chloride 0.9 % 100 mL IVPB (mini-bag)  1,000 mg IntraVENous Q12H    sodium bicarbonate 150 mEq in dextrose 5 % 1,000 mL infusion   IntraVENous Continuous    phenylephrine (NEO-SYNEPHRINE) 50 mg in sodium chloride 0.9 % 250 mL infusion (Vial2Bag)  10-300 mcg/min IntraVENous Continuous    thiamine (B-1) 200 mg in sodium chloride 0.9 % 100 mL IVPB  200 mg IntraVENous BID    prismaSol BGK 2/3.5 dialysis solution   Dialysis Continuous

## 2022-06-02 NOTE — Procedures (Signed)
[facility]      Procedure Note - Arterial Access:   Performed by Sarita Bottom, APRN - NP.  Diagnosis: Septic Shock, ARF  Insertion Date: 06/02/22   Time: 3:21 AM   Obtained Consent? Emergent  Procedure Location:  ICU    Immediately prior to the procedure, the patient was reevaluated and found suitable for the planned procedure and any planned medications.  Immediately prior to the procedure a time out was called to verify the correct patient, procedure, equipment, staff, and marking as appropriate.  Collateral circulation confirmed with Freida Busman test.     Line Bundle:  Full sterile barrier precautions used.  5 mL 1% Lidocaine placed at insertion site.      Method: Seldinger technique.   Site Prep: Left wrist  Procedure: Arterial Catheter Insertion in left radial  Catheter new site  Number of Attempts: 2 Indication: Monitoring  There was bright red, pulsatile blood return.  Femoral Site? NA If Yes, reason femoral site was chosen:NA  Catheter secured. Biopatch in place? Yes Sterile Bio-occlusive dressing placed.  Complication None  The procedure was tolerated Well    Sarita Bottom, APRN - NP   Critical Care Medicine  [facility]

## 2022-06-02 NOTE — Care Coordination-Inpatient (Signed)
CM attemtped to complete assessment.  Pt is currently on ventilator so unable to do assessment with Pt.  CM attempted to call Wife, Deforest Maiden at [phone] and it just ring busy with no option for VM.      RN if you talk to wife please verify the number and provide them with number to call CM: Vernice: [phone]    CM will continue to monitor discharge plan.     Leone Brand, CM  Ext [phone]

## 2022-06-02 NOTE — Progress Notes (Signed)
[facility] PROGRESS NOTE.      Name: Jaime [name]   DOB: Jul 10, 1946   MRN: [id number]   Date: 06/02/2022      Chief Complaint   Patient presents with    Hypotension     Hx hypertension - BP is 70s/40s    Altered Mental Status    Nausea    Emesis       Reason for ICU:     Septic shock with multi organ failure  Acute abdomen, s/p emergent exploratory laparotomy 06/01/22    Hospital Course:     76 y/o male (visiting [place] from [place]) with PMHx significant of HTN, HLD and T2DM. Came in ED 7/30 noon for N/V/Abd pain and generalized weakess. Found to be encephalopathic, shock state (BP 80s and lactate 10), AKI (Cr 4.06). Received 2L LR, started on Levophed, Vancomycin/Zosyn. Surgery consulted and patient underwent emergent exploratory laparotomy last night that did not reveal perforation but fluid collection in right upper quadrant that was sent for cultures and sensitivity.   7/31/: Patient transferred to ICU post op early AM. Increasing pressor needs, shock liver, thrombocytopenia, coagulopathic, anuric AKI with developing hyperkalemia and started on [facility]. Stress dose steroids and thiamine added for refractory shock. Abx changed to Vanc and merrem.       Subjective/ Objective:     Overnight, unable to wean pressors, ventilator and sedation. Overall stayed critical condition.   This AM, patient had VT with hemodynamic compromise despite 3 pressors. Shocked x 2 with 150 J followed by 2 amio boluses 150 mg each and amio drip at 60 mg per hour. Rhythm changes to narrow complex SVT with occasional PVCs. EKG showed some ST and T wave changes.   Patient anuric now  Blood cultures growing enterobacter and E coli.   Abd fluid is NGTD    Principle ICU Diagnosis     # Septic shock, refractory with persistently high lactate. Suspecting intra abdominal source.   # Acute abdomen from suspected abdominal viscus perforation   S/p emergent exploratory laparotomy 06/01/22    On 3 IV pressors (Levo, Vaso and Neo)   Stress dose  steroids and thiamine added 06/01/22   Fludrocortisone added 06/02/22   On Vanc/ merrem since 7/31   BC growing enterobacter and e coli 7/31   Abd fluid NGTD    # Elevated troponin, likely demand ischemia with non specific EKG changes  # VT with further hemodynamic compromise    S/p electric CV x 2 followed by amio bolus 150 mg x2 on 06/01/22 AM   On amio drip     # Acute respiratory failure with hypoxemia and hypercapnia, ventilatory dependant   Intubated 06/01/22    # Acute encephalopathy, likely metabolic and toxic from sepsis  # Propofol infusion toxicity/ intolerance   Rising Cpk TG and cardiac arrythmia noted 06/02/22    # Acute Kidney injury, anuric. Likely ATN   CVVHDF initiated 06/01/22 through right Fem NTC  # Non anion gap metabolic acidosis from lactic acidosis   LR changed to bicarb drip 06/01/22    # Multi organ failure including shock liver with impending liver failure  # Coagulopathy, rising PT, INR  # Thrombocytopenia, worsening.    Fibrinogen 651    Plan  - Add fludrocortisone for refractory septic shock  - Obtain echocardiogram, suspecting cardiac component  - Stop Propofol, developing toxicity likely with high TG and CPK, and possible propofol infusion syndrome (less likely though)  - Start versed dip,  keep fentanyl drip and aim RAAS -1 to 2  - Stop vancomycin.   - Keep merrem until sensitivity results. Follow abd fluid cultures  - keep trending lactate, can space out now to 8 hourly  - Continue CVVHDF per nephrology, zero net fluid removal for now  - Continue lung protective vent support per protocol    I personally spent 80 minutes of critical care time.  This is time spent at this critically ill patient's bedside actively involved in patient care as well as the coordination of care and discussions with the patient's family.  This does not include any procedural time which has been billed separately.       CRITICAL CARE CONSULTANT NOTE  I had a face to face encounter with the patient, reviewed and  interpreted patient data including clinical events, labs, images, vital signs, I/O's, and examined patient.  I have discussed the case and the plan and management of the patient's care with the consulting services, the bedside nurses and the respiratory therapist.      NOTE OF PERSONAL INVOLVEMENT IN CARE   This patient has a high probability of imminent, clinically significant deterioration, which requires the highest level of preparedness to intervene urgently. I participated in the decision-making and personally managed or directed the management of the following life and organ supporting interventions that required my frequent assessment to treat or prevent imminent deterioration.    Yevonne Pax, MD   [facility]  [phone]  06/02/2022      Review of systems:     Review of Systems   Unable to obtain, patient is intubated and sedated.     Objective:     Vital Signs:  BP 114/60   Pulse (!) 111   Temp 99.8 F (37.7 C) (Axillary)   Resp 20   Ht 1.727 m (5\' 8" )   Wt 102 kg (224 lb 14.4 oz)   SpO2 94%   BMI 34.20 kg/m      Temp (24hrs), Avg:99.6 F (37.6 C), Min:98.3 F (36.8 C), Max:101.4 F (38.6 C)           Intake/Output:     Intake/Output Summary (Last 24 hours) at 06/02/2022 1027  Last data filed at 06/02/2022 1000  Gross per 24 hour   Intake 4135.35 ml   Output 2500 ml   Net 1635.35 ml         Physical Exam  Constitutional:       Appearance: He is ill-appearing and toxic-appearing.   HENT:      Head: Normocephalic and atraumatic.   Eyes:      Pupils: Pupils are equal, round, and reactive to light.   Cardiovascular:      Rate and Rhythm: Normal rate and regular rhythm.      Pulses: Normal pulses.      Heart sounds: Normal heart sounds.   Abdominal:      General: There is distension.   Neurological:      Comments: Unable to assess   Psychiatric:      Comments: Unable to assess     Intubated and sedated.     Past Medical History:        has no past medical history on file.    Past Surgical  History:      has a past surgical history that includes laparotomy (N/A, 06/01/2022); laparoscopy (N/A, 06/01/2022); and Bladder surgery (N/A, 06/01/2022).      Home Medications:     Prior to  Admission medications    Medication Sig Start Date End Date Taking? Authorizing Provider   metFORMIN (GLUCOPHAGE) 500 MG tablet Take 1 tablet by mouth daily   Yes Historical Provider, MD   metoprolol (LOPRESSOR) 100 MG tablet Take 1 tablet by mouth daily   Yes Historical Provider, MD   hydrALAZINE (APRESOLINE) 25 MG tablet Take 1 tablet by mouth 3 times daily   Yes Historical Provider, MD   atorvastatin (LIPITOR) 40 MG tablet Take 1 tablet by mouth daily   Yes Historical Provider, MD   losartan (COZAAR) 100 MG tablet Take 1 tablet by mouth daily   Yes Historical Provider, MD         Allergies/Social/Family History:     Allergies   Allergen Reactions    Augmentin [Amoxicillin-Pot Clavulanate] Hives    Codeine      Unknown reaction      Social History     Tobacco Use    Smoking status: Not on file    Smokeless tobacco: Not on file   Substance Use Topics    Alcohol use: Not on file      No family history on file.    LABS AND  DATA:   Reviewed    Peak airway pressure:      Minute ventilation:

## 2022-06-03 LAB — BLOOD GAS, ARTERIAL
Base deficit, arterial blood: 2.9 mmol/L
FIO2 Arterial: 50 %
HCO3, Arterial: 22 mmol/L (ref 22–26)
POC O2 SAT: 95 % (ref 92–97)
POC PEEP/CPA: 7
POC TIDAL VOLUME: 500
Set Rate, POC: 20
pCO2, Arterial: 37 mmHg (ref 35–45)
pH, Arterial: 7.38 (ref 7.35–7.45)
pO2, Arterial: 77 mmHg — ABNORMAL LOW (ref 80–100)

## 2022-06-03 LAB — POCT GLUCOSE
POC Glucose: 176 mg/dL — ABNORMAL HIGH (ref 65–100)
POC Glucose: 180 mg/dL — ABNORMAL HIGH (ref 65–100)
POC Glucose: 175 mg/dL — ABNORMAL HIGH (ref 65–117)
POC Glucose: 170 mg/dL — ABNORMAL HIGH (ref 65–117)

## 2022-06-03 LAB — ECHO (TTE) COMPLETE (PRN CONTRAST/BUBBLE/STRAIN/3D)
AV Area by Peak Velocity: 2.6 cm2
AV Peak Gradient: 10 mmHg
AV Peak Velocity: 1.5 m/s
AV Velocity Ratio: 0.6
AVA/BSA Peak Velocity: 1.2 cm2/m2
Ao Root Index: 1.5 cm/m2
Aortic Root: 3.2 cm
Body Surface Area: 2.21 m2
Fractional Shortening 2D: 41 % (ref 28–44)
IVSd: 1.7 cm — AB (ref 0.6–1.0)
LA Diameter: 3.9 cm
LA Size Index: 1.82 cm/m2
LA Volume A-L A4C: 78 mL — AB (ref 18–58)
LA Volume A-L A4C: 86 mL — AB (ref 18–58)
LA/AO Root Ratio: 1.22
LV E' Lateral Velocity: 6 cm/s
LV E' Septal Velocity: 5 cm/s
LV Mass 2D Index: 154.1 g/m2 — AB (ref 49–115)
LV Mass 2D: 329.8 g — AB (ref 88–224)
LV RWT Ratio: 0.7
LVIDd Index: 2.15 cm/m2
LVIDd: 4.6 cm (ref 4.2–5.9)
LVIDs Index: 1.26 cm/m2
LVIDs: 2.7 cm
LVOT Area: 4.2 cm2
LVOT Diameter: 2.3 cm
LVOT Peak Gradient: 3 mmHg
LVOT Peak Velocity: 0.9 m/s
LVPWd: 1.6 cm — AB (ref 0.6–1.0)
RA Volume: 47 ml
RA Volume: 50 ml
TAPSE: 0.8 cm — AB (ref 1.7–?)

## 2022-06-03 LAB — COMPREHENSIVE METABOLIC PANEL
ALT: 1519 U/L — ABNORMAL HIGH (ref 12–78)
AST: 1419 U/L — ABNORMAL HIGH (ref 15–37)
Albumin/Globulin Ratio: 0.6 — ABNORMAL LOW (ref 1.1–2.2)
Albumin: 2.1 g/dL — ABNORMAL LOW (ref 3.5–5.0)
Alk Phosphatase: 440 U/L — ABNORMAL HIGH (ref 45–117)
Anion Gap: 10 mmol/L (ref 5–15)
BUN: 48 MG/DL — ABNORMAL HIGH (ref 6–20)
Bun/Cre Ratio: 18 (ref 12–20)
CO2: 21 mmol/L (ref 21–32)
Calcium: 8.7 MG/DL (ref 8.5–10.1)
Chloride: 105 mmol/L (ref 97–108)
Creatinine: 2.73 MG/DL — ABNORMAL HIGH (ref 0.70–1.30)
Est, Glom Filt Rate: 24 mL/min/{1.73_m2} — ABNORMAL LOW (ref >60)
Globulin: 3.5 g/dL (ref 2.0–4.0)
Glucose: 180 mg/dL — ABNORMAL HIGH (ref 65–100)
Potassium: 4.2 mmol/L (ref 3.5–5.1)
Sodium: 136 mmol/L (ref 136–145)
Total Bilirubin: 8.3 MG/DL — ABNORMAL HIGH (ref 0.2–1.0)
Total Protein: 5.6 g/dL — ABNORMAL LOW (ref 6.4–8.2)

## 2022-06-03 LAB — CBC
Hematocrit: 36.4 % — ABNORMAL LOW (ref 36.6–50.3)
Hemoglobin: 12.2 g/dL (ref 12.1–17.0)
MCH: 29.8 PG (ref 26.0–34.0)
MCHC: 33.5 g/dL (ref 30.0–36.5)
MCV: 89 FL (ref 80.0–99.0)
MPV: ABNORMAL FL (ref 8.9–12.9)
Nucleated RBCs: 0.7 PER 100 WBC — ABNORMAL HIGH
Platelets: 27 10*3/uL — CL (ref 150–400)
RBC: 4.09 M/uL — ABNORMAL LOW (ref 4.10–5.70)
RDW: 14.7 % — ABNORMAL HIGH (ref 11.5–14.5)
WBC: 29.3 10*3/uL — ABNORMAL HIGH (ref 4.1–11.1)
nRBC: 0.2 10*3/uL — ABNORMAL HIGH (ref 0.00–0.01)

## 2022-06-03 LAB — POCT BLOOD GAS & ELECTROLYTES
Anion Gap, POC: 10 (ref 10–20)
Anion Gap, POC: 10 (ref 10–20)
BASE DEFICIT (POC): 3.7 mmol/L
BASE DEFICIT (POC): 2.4 mmol/L
Critical Value Read Back: 1
HCO3, Arterial: 21 mmol/L
HCO3, Arterial: 22 mmol/L
POC Chloride: 106 MMOL/L (ref 100–108)
POC Chloride: 107 MMOL/L (ref 100–108)
POC Creatinine: 3.1 MG/DL — ABNORMAL HIGH (ref 0.6–1.3)
POC Creatinine: 3.3 MG/DL — ABNORMAL HIGH (ref 0.6–1.3)
POC Glucose: 177 MG/DL — ABNORMAL HIGH (ref 74–106)
POC Glucose: 201 MG/DL — ABNORMAL HIGH (ref 74–106)
POC Ionized Calcium: 1.12 mmol/L (ref 1.12–1.32)
POC Ionized Calcium: 1.07 mmol/L — ABNORMAL LOW (ref 1.12–1.32)
POC Lactic Acid: 4.95 mmol/L (ref 0.40–2.00)
POC Lactic Acid: 5.08 mmol/L (ref 0.40–2.00)
POC O2 SAT: 97 %
POC O2 SAT: 98 %
POC Potassium: 4.3 MMOL/L (ref 3.5–5.5)
POC Potassium: 4.2 MMOL/L (ref 3.5–5.5)
POC Sodium: 137 MMOL/L (ref 136–145)
POC Sodium: 139 MMOL/L (ref 136–145)
POC TCO2: 21 MMOL/L (ref 19–24)
POC TCO2: 22 MMOL/L (ref 19–24)
eGFR, POC: 20 mL/min/{1.73_m2} — ABNORMAL LOW (ref >60)
eGFR, POC: 19 mL/min/{1.73_m2} — ABNORMAL LOW (ref >60)
pCO2, Arterial, POC: 35.1 MMHG (ref 35.0–45.0)
pCO2, Arterial, POC: 34.4 MMHG — ABNORMAL LOW (ref 35.0–45.0)
pH, Arterial, POC: 7.38 (ref 7.35–7.45)
pH, Arterial, POC: 7.41 (ref 7.35–7.45)
pO2, Arterial, POC: 88 MMHG (ref 80–100)
pO2, Arterial, POC: 108 MMHG — ABNORMAL HIGH (ref 80–100)

## 2022-06-03 LAB — BASIC METABOLIC PANEL
Anion Gap: 10 mmol/L (ref 5–15)
BUN: 49 MG/DL — ABNORMAL HIGH (ref 6–20)
Bun/Cre Ratio: 19 (ref 12–20)
CO2: 21 mmol/L (ref 21–32)
Calcium: 8.3 MG/DL — ABNORMAL LOW (ref 8.5–10.1)
Chloride: 106 mmol/L (ref 97–108)
Creatinine: 2.52 MG/DL — ABNORMAL HIGH (ref 0.70–1.30)
Est, Glom Filt Rate: 26 mL/min/{1.73_m2} — ABNORMAL LOW (ref >60)
Glucose: 192 mg/dL — ABNORMAL HIGH (ref 65–100)
Potassium: 4.3 mmol/L (ref 3.5–5.1)
Sodium: 137 mmol/L (ref 136–145)

## 2022-06-03 LAB — CULTURE, BODY FLUID
Gram stain: NONE SEEN
Gram stain: NONE SEEN

## 2022-06-03 LAB — LACTIC ACID
Lactic Acid, Plasma: 4.5 MMOL/L (ref 0.4–2.0)
Lactic Acid, Plasma: 3.6 MMOL/L (ref 0.4–2.0)
Lactic Acid, Plasma: 2.4 MMOL/L (ref 0.4–2.0)

## 2022-06-03 LAB — EXTRA TUBES HOLD

## 2022-06-03 LAB — CULTURE, BLOOD 2

## 2022-06-03 LAB — CULTURE, BLOOD 1

## 2022-06-03 LAB — MAGNESIUM
Magnesium: 2.1 mg/dL (ref 1.6–2.4)
Magnesium: 2 mg/dL (ref 1.6–2.4)

## 2022-06-03 LAB — PHOSPHORUS
Phosphorus: 3.8 MG/DL (ref 2.6–4.7)
Phosphorus: 4.1 MG/DL (ref 2.6–4.7)

## 2022-06-03 LAB — CULTURE, ANAEROBIC

## 2022-06-03 MED ORDER — CEFTRIAXONE SODIUM 2 G IJ SOLR
2 | INTRAMUSCULAR | Status: DC
Start: 2022-06-03 — End: 2022-06-05
  Administered 2022-06-03 – 2022-06-05 (×3): 2000 mg via INTRAVENOUS

## 2022-06-03 MED FILL — SOLU-CORTEF 100 MG IJ SOLR: 100 MG | INTRAMUSCULAR | Qty: 2

## 2022-06-03 MED FILL — PANTOPRAZOLE SODIUM 40 MG IV SOLR: 40 MG | INTRAVENOUS | Qty: 40

## 2022-06-03 MED FILL — HUMALOG 100 UNIT/ML IJ SOLN: 100 UNIT/ML | INTRAMUSCULAR | Qty: 1

## 2022-06-03 MED FILL — NOREPINEPHRINE-SODIUM CHLORIDE 16-0.9 MG/250ML-% IV SOLN: INTRAVENOUS | Qty: 500

## 2022-06-03 MED FILL — HUMULIN N 100 UNIT/ML SC SUSP: 100 UNIT/ML | SUBCUTANEOUS | Qty: 5

## 2022-06-03 MED FILL — MERREM 1 G IV SOLR: 1 g | INTRAVENOUS | Qty: 1000

## 2022-06-03 MED FILL — NOREPINEPHRINE-SODIUM CHLORIDE 16-0.9 MG/250ML-% IV SOLN: INTRAVENOUS | Qty: 250

## 2022-06-03 MED FILL — THIAMINE HCL 100 MG/ML IJ SOLN: 100 MG/ML | INTRAMUSCULAR | Qty: 2

## 2022-06-03 MED FILL — ERAXIS 100 MG IV SOLR: 100 MG | INTRAVENOUS | Qty: 30

## 2022-06-03 MED FILL — SODIUM CHLORIDE 0.9 % IV SOLN: 0.9 % | INTRAVENOUS | Qty: 250

## 2022-06-03 MED FILL — VASOPRESSIN 20 UNIT/ML IV SOLN: 20 UNIT/ML | INTRAVENOUS | Qty: 1

## 2022-06-03 MED FILL — MIDAZOLAM-SODIUM CHLORIDE 100-0.9 MG/100ML-% IV SOLN: INTRAVENOUS | Qty: 100

## 2022-06-03 MED FILL — AMIODARONE HCL 450 MG/9ML IV SOLN: 450 MG/9ML | INTRAVENOUS | Qty: 9

## 2022-06-03 MED FILL — FENTANYL CITRATE-NACL 1-0.9 MG/100ML-% IV SOLN: INTRAVENOUS | Qty: 100

## 2022-06-03 MED FILL — HUMULIN N 100 UNIT/ML SC SUSP: 100 UNIT/ML | SUBCUTANEOUS | Qty: 1

## 2022-06-03 MED FILL — CHLORHEXIDINE GLUCONATE 0.12 % MT SOLN: 0.12 % | OROMUCOSAL | Qty: 15

## 2022-06-03 MED FILL — FLUDROCORTISONE ACETATE 0.1 MG PO TABS: 0.1 MG | ORAL | Qty: 1

## 2022-06-03 MED FILL — CEFTRIAXONE SODIUM 2 G IJ SOLR: 2 g | INTRAMUSCULAR | Qty: 2000

## 2022-06-03 NOTE — Care Coordination-Inpatient (Addendum)
Care Management Initial Assessment       RUR: 13%  Readmission? No  1st IM letter given? Yes -06/03/22  1st [employer] letter given: No     06/03/22 1438   Service Assessment   Patient Orientation Unable to Assess   Cognition Other (see comment)  (Unable to asess)   History Provided By [facility]   Primary Caregiver Self   Support Systems Spouse/Significant Other;Children   Patient's Healthcare Decision Maker is: Armed forces operational officer Next of Kin  ([name]-Jaime [name])   PCP Verified by CM Yes   Last Visit to PCP Within last 3 months  (Dr. Marylene Land [facility] in [place])   Prior Functional Level Independent in ADLs/IADLs   Current Functional Level Assistance with the following:;Bathing;Dressing;Toileting;Feeding;Cooking;Housework;Shopping;Mobility   Can patient return to prior living arrangement Unknown at present   Family able to assist with home care needs: No   Would you like for me to discuss the discharge plan with any other family members/significant others, and if so, who? Yes  ([name] Barista)   Event organiser Resources None   Social/Functional History   Lives With Spouse   Type of Home House   Home Equipment None   Active Driver Yes   Discharge Planning   Type of Residence [place];Acute Rehab;Skilled Nursing Facility   Current Services Prior To Admission None   Patient expects to be discharged to: House     CM introduce self, explain role and confirm demographics via phone with pt's spouse Jaime Matsuo.    Pt lives with his spouse in an one story home with 4-5 steps to enter.     No DME reported.    No hx of home health, SNF or inpatient rehab.    According to pt's spouse pt goes to the gym daily.     Pt still works part time.    Pt either uses [facility] or [facility] on [address] in [place].    At the time of d/c pt's family will transport.    CM will follow and assist with d/c planning.    Jaime [name]  Social Worker/Case manager  [phone]

## 2022-06-03 NOTE — Interdisciplinary (Signed)
Critical care interdisciplinary rounds today.  Following members present: Case Management, Chaplain, Clinical Lead, Diabetes Treatment, Nursing, Nutrition, Pharmacy, and Physician.   Plan of care discussed.  See clinical pathway for plan of care and interventions and desired outcomes.   Continue broad spectrum antibiotics. Continue CRRT and wean pressors if able.

## 2022-06-03 NOTE — Progress Notes (Signed)
NAME: Jaime [name]        DOB:  December 02, 1945        MRN:  [id number]                     Assessment   :                                               Plan:  AKI  Sepsis  Hypotension  HTN  DM       AKI related to sepsis and shock. CT showed renal cysts.  He has poor UO.      CVVHD started 7/31.      Continue with factor 0.  2K bags.     Continue sodium bicarbonate IV.     Continue pressors for BP support.     Patient is critically ill and at significant risk of deterioration.        DW RN.            Subjective:     Chief Complaint:  intubated.    Review of Systems:    Symptom Y/N Comments  Symptom Y/N Comments   Fever/Chills    Chest Pain     Poor Appetite    Edema     Cough    Abdominal Pain     Sputum    Joint Pain     SOB/DOE    Pruritis/Rash     Nausea/vomit    Tolerating PT/OT     Diarrhea    Tolerating Diet     Constipation    Other       Could not obtain due to:      Objective:     VITALS:   Last 24hrs VS reviewed since prior progress note. Most recent are:  Vitals:    06/03/22 1000   BP:    Pulse: 61   Resp: 20   Temp:    SpO2: 96%       Intake/Output Summary (Last 24 hours) at 06/03/2022 1024  Last data filed at 06/03/2022 1000  Gross per 24 hour   Intake 3594.02 ml   Output 3683 ml   Net -88.98 ml        Telemetry Reviewed:     PHYSICAL EXAM:  General: Intubated  +edema      Lab Data Reviewed: (see below)    Medications Reviewed: (see below)    PMH/SH reviewed - no change compared to H&P  ________________________________________________________________________  Care Plan discussed with:  Patient     Family      RN     Care Manager                    Consultant:          Comments   >50% of visit spent in counseling and coordination of care       ________________________________________________________________________  Marina Gravel, MD     Procedures: see electronic medical records for all procedures/Xrays and details which  were  not copied into this note but were reviewed prior to creation of Plan.      LABS:  Recent Labs     06/02/22  0405 06/03/22  0403   WBC 26.8* 29.3*   HGB 13.0 12.2   HCT 39.6 36.4*   PLT 32* 27*  Recent Labs     06/02/22  0405 06/02/22  0725 06/02/22  1619 06/03/22  0403   NA 139  140 139 136 136   K 5.0  5.0 4.7 4.2 4.2   CL 106  106 106 104 105   CO2 20*  20* 23 23 21    BUN 68*  69* 68* 53* 48*   MG 1.8  --  2.1 2.1   PHOS 5.8*  --  4.3 3.8       Recent Labs     06/01/22  0618 06/02/22  0405 06/03/22  0403   GLOB 2.4 3.3 3.5       Recent Labs     06/01/22  0003 06/02/22  0405   INR 1.4* 2.0*        No results for input(s): TIBC, FERR in the last 72 hours.    Invalid input(s): FE, PSAT   No results found for: FOL, RBCF   No results for input(s): PH, PCO2, PO2 in the last 72 hours.  No results for input(s): CPK, CKMB, TROPONINI in the last 72 hours.  No components found for: [facility]  @labua @    MEDICATIONS:  Current Facility-Administered Medications   Medication Dose Route Frequency    cefTRIAXone (ROCEPHIN) 2,000 mg in sodium chloride 0.9 % 50 mL IVPB (mini-bag)  2,000 mg IntraVENous Q24H    amiodarone (CORDARONE) 450 mg in dextrose 5 % 250 mL infusion (Vial2Bag)  0.5 mg/min IntraVENous Continuous    midazolam (VERSED) 100mg /163mL in NS infusion  1-10 mg/hr IntraVENous Continuous    fludrocortisone (FLORINEF) tablet 0.1 mg  0.1 mg Oral Daily    glucose chewable tablet 16 g  4 tablet Oral PRN    dextrose bolus 10% 125 mL  125 mL IntraVENous PRN    Or    dextrose bolus 10% 250 mL  250 mL IntraVENous PRN    glucagon (rDNA) injection 1 mg  1 mg SubCUTAneous PRN    dextrose 10 % infusion   IntraVENous Continuous PRN    insulin NPH (HumuLIN N;NovoLIN N) injection vial 5 Units  5 Units SubCUTAneous 2 times per day    insulin lispro (HUMALOG) injection vial 0-4 Units  0-4 Units SubCUTAneous 4 times per day    insulin lispro (HUMALOG) injection vial 0-4 Units  0-4 Units SubCUTAneous Nightly    norepinephrine  (LEVOPHED) 16 mg in sodium chloride 0.9 % 250 mL infusion  1-100 mcg/min IntraVENous Continuous    vasopressin 20 Units in sodium chloride 0.9 % 100 mL infusion  0.03 Units/min IntraVENous Continuous    sodium chloride flush 0.9 % injection 5-40 mL  5-40 mL IntraVENous 2 times per day    sodium chloride flush 0.9 % injection 5-40 mL  5-40 mL IntraVENous PRN    0.9 % sodium chloride infusion   IntraVENous PRN    acetaminophen (TYLENOL) tablet 650 mg  650 mg Oral Q6H PRN    Or    acetaminophen (TYLENOL) suppository 650 mg  650 mg Rectal Q6H PRN    ondansetron (ZOFRAN) injection 4 mg  4 mg IntraVENous Q6H PRN    chlorhexidine (PERIDEX) 0.12 % solution 15 mL  15 mL Mouth/Throat BID    pantoprazole (PROTONIX) 40 mg in sodium chloride (PF) 0.9 % 10 mL injection  40 mg IntraVENous Q12H    hydrocortisone sodium succinate PF (SOLU-CORTEF) injection 50 mg  50 mg IntraVENous Q6H    anidulafungin (ERAXIS) 100 mg in sodium chloride 0.9 % 130  mL IVPB  100 mg IntraVENous Q24H    fentanyl (SUBLIMAZE) infusion 1000 mcg/161mL  25-200 mcg/hr IntraVENous Continuous    phenylephrine (NEO-SYNEPHRINE) 50 mg in sodium chloride 0.9 % 250 mL infusion (Vial2Bag)  10-300 mcg/min IntraVENous Continuous    thiamine (B-1) 200 mg in sodium chloride 0.9 % 100 mL IVPB  200 mg IntraVENous BID    prismaSol BGK 2/3.5 dialysis solution   Dialysis Continuous

## 2022-06-03 NOTE — Progress Notes (Signed)
[facility] PROGRESS NOTE.      Name: Jaime [name]   DOB: 10-28-1946   MRN: [id number]   Date: 06/03/2022      Chief Complaint   Patient presents with    Hypotension     Hx hypertension - BP is 70s/40s    Altered Mental Status    Nausea    Emesis       Reason for ICU:     Septic shock with multi organ failure  Acute abdomen, s/p emergent exploratory laparotomy 06/01/22    Hospital Course:     76 y/o male (visiting [place] from [place]) with PMHx significant of HTN, HLD and T2DM. Came in ED 7/30 noon for N/V/Abd pain and generalized weakess. Found to be encephalopathic, shock state (BP 80s and lactate 10), AKI (Cr 4.06). Received 2L LR, started on Levophed, Vancomycin/Zosyn. Surgery consulted and patient underwent emergent exploratory laparotomy last night that did not reveal perforation but fluid collection in right upper quadrant that was sent for cultures and sensitivity.   7/31/: Patient transferred to ICU post op early AM. Increasing pressor needs, shock liver, thrombocytopenia, coagulopathic, anuric AKI with developing hyperkalemia and started on [facility]. Stress dose steroids and thiamine added for refractory shock. Abx changed to Vanc and merrem.   06/02/22: VT episodes with pulse, shocked x 3 and amio boluses 150 mg each x 3 fallowed by amio drip. Cardio consulted.       Subjective/ Objective:     Unable to give history, stays intubated and sedated.   Pressor needs getting lower.   AF with HR in 90s, on amio drip at 0.5  Stays anuric     Principle ICU Diagnosis     # Septic shock, 2/2 intra abdominal source.    # Acute abdomen from suspected abdominal viscus perforation   S/p emergent exploratory laparotomy 06/01/22    Stress dose steroids and thiamine added 06/01/22   Fludrocortisone added 06/02/22   Vanc stopped 06/02/22. Currently on merrem and eraxis   BC growing enterobacter and e coli 7/31   Abd fluid NGTD   Improving pressor needs noted today     # Elevated troponin, likely demand ischemia with non specific  EKG changes  # VT with further hemodynamic compromise 06/02/22, controlled now   S/p electric CV x 2 followed by amio bolus 150 mg x2 on 06/01/22 AM   On amio drip    # Acute respiratory failure with hypoxemia and hypercapnia, ventilatory dependant   Intubated 06/01/22    # Acute encephalopathy, likely metabolic and toxic from sepsis  # Propofol infusion toxicity/ intolerance   Rising Cpk TG and cardiac arrythmia noted 06/02/22   Propofol changed to versed 06/02/22    # Acute Kidney injury, anuric. Likely ATN   CVVHDF initiated 06/01/22 through right Fem NTC    # Multi organ failure including shock liver  # Coagulopathy, elevated PT, INR with no evidence of obvious bleeding  # Thrombocytopenia, worsening.    Fibrinogen 651    Plan  - Continue stress doe steroids for now and wean levophed for MAP > 65  - Continue versed dip, keep fentanyl drip and aim RAAS -1 to 2  - Continue lung protective vent support per protocol  - Keep merrem and eraxis until sensitivity results. Follow abd fluid cultures  - Continue CVVHDF per nephrology, zero net fluid removal for now      I personally spent 60 minutes of critical care time.  This is  time spent at this critically ill patient's bedside actively involved in patient care as well as the coordination of care and discussions with the patient's family.  This does not include any procedural time which has been billed separately.       CRITICAL CARE CONSULTANT NOTE  I had a face to face encounter with the patient, reviewed and interpreted patient data including clinical events, labs, images, vital signs, I/O's, and examined patient.  I have discussed the case and the plan and management of the patient's care with the consulting services, the bedside nurses and the respiratory therapist.      NOTE OF PERSONAL INVOLVEMENT IN CARE   This patient has a high probability of imminent, clinically significant deterioration, which requires the highest level of preparedness to intervene urgently. I  participated in the decision-making and personally managed or directed the management of the following life and organ supporting interventions that required my frequent assessment to treat or prevent imminent deterioration.    Yevonne Pax, MD   [facility]  [phone]  06/03/2022      Review of systems:     Review of Systems   Unable to obtain, patient is intubated and sedated.     Objective:     Vital Signs:  BP (!) 173/150   Pulse 94   Temp 99.3 F (37.4 C) (Axillary)   Resp 20   Ht 1.727 m (5' 7.99")   Wt 101.6 kg (224 lb)   SpO2 95%   BMI 34.07 kg/m      Temp (24hrs), Avg:99.5 F (37.5 C), Min:97.6 F (36.4 C), Max:100.9 F (38.3 C)           Intake/Output:     Intake/Output Summary (Last 24 hours) at 06/03/2022 1601  Last data filed at 06/03/2022 1600  Gross per 24 hour   Intake 2620.78 ml   Output 2676 ml   Net -55.22 ml         Physical Exam  Constitutional:       Appearance: He is ill-appearing and toxic-appearing.   HENT:      Head: Normocephalic and atraumatic.   Eyes:      Pupils: Pupils are equal, round, and reactive to light.   Cardiovascular:      Rate and Rhythm: Normal rate and regular rhythm.      Pulses: Normal pulses.      Heart sounds: Normal heart sounds.   Abdominal:      General: There is distension.   Neurological:      Comments: Unable to assess   Psychiatric:      Comments: Unable to assess     Intubated and sedated.     Past Medical History:        has no past medical history on file.    Past Surgical History:      has a past surgical history that includes laparotomy (N/A, 06/01/2022); laparoscopy (N/A, 06/01/2022); and Bladder surgery (N/A, 06/01/2022).      Home Medications:     Prior to Admission medications    Medication Sig Start Date End Date Taking? Authorizing Provider   metFORMIN (GLUCOPHAGE) 500 MG tablet Take 1 tablet by mouth daily   Yes Historical Provider, MD   metoprolol (LOPRESSOR) 100 MG tablet Take 1 tablet by mouth daily   Yes Historical Provider, MD    hydrALAZINE (APRESOLINE) 25 MG tablet Take 1 tablet by mouth 3 times daily   Yes Historical Provider, MD  atorvastatin (LIPITOR) 40 MG tablet Take 1 tablet by mouth daily   Yes Historical Provider, MD   losartan (COZAAR) 100 MG tablet Take 1 tablet by mouth daily   Yes Historical Provider, MD         Allergies/Social/Family History:     Allergies   Allergen Reactions    Augmentin [Amoxicillin-Pot Clavulanate] Hives    Codeine      Unknown reaction      Social History     Tobacco Use    Smoking status: Not on file    Smokeless tobacco: Not on file   Substance Use Topics    Alcohol use: Not on file      No family history on file.    LABS AND  DATA:   Reviewed    Peak airway pressure:      Minute ventilation:

## 2022-06-03 NOTE — Progress Notes (Signed)
EP/ ARRHYTHMIA Progress Note    Patient ID:  Patient: Jaime [name]  MRN: [id number]  Age: 76 y.o.  DOB: December 04, 1945    Date of  Admission: 05/31/2022 11:25 PM   PCP:  No primary care provider on file.  Usual cardiologist:  Jaime Red, MD in [facility] [phone]    Assessment:   Sustained monomorphic VT but also with some salvoes earlier today.  Suspect this is ectopic and due to critical illness and catecholamines.  Persistent atrial fibrillation.  Moderate aortic stenosis by outside echo 2020, limited study done here on 8/1.  Sepsis with GNR.  Diagnostic laparoscopy, laparotomy, EGD on 7/31.  Multiorgan dysfunction, shock.  Severe thrombocytopenia.  Full code.    Plan:     Continue amiodarone infusion.  Can bolus freely as needed for VT.  If an additional agent is needed, consider lidocaine bolus.        [x]        High complexity decision making was performed in this patient at high risk for decompensation with multiple organ involvement.    Jaime [name] is a 76 y.o. male with a history of the above.  I was asked to consult due to recurrent VT requiring repeated amiodarone and shock attempts.     No past medical history on file.     Past Surgical History:   Procedure Laterality Date    BLADDER SURGERY N/A 06/01/2022    ENDOSCOPY OF ILEAL CONDUIT performed by 06/03/2022, MD at [facility] MAIN OR    LAPAROSCOPY N/A 06/01/2022    LAPAROSCOPY DIAGNOSTIC performed by 06/03/2022, MD at [facility] OR    LAPAROTOMY N/A 06/01/2022    LAPAROTOMY EXPLORATORY performed by 06/03/2022, MD at [facility]       Social History     Tobacco Use    Smoking status: Not on file    Smokeless tobacco: Not on file   Substance Use Topics    Alcohol use: Not on file        No family history on file.     Allergies   Allergen Reactions    Augmentin [Amoxicillin-Pot Clavulanate] Hives    Codeine      Unknown reaction          Current Facility-Administered Medications   Medication Dose Route Frequency     cefTRIAXone (ROCEPHIN) 2,000 mg in sodium chloride 0.9 % 50 mL IVPB (mini-bag)  2,000 mg IntraVENous Q24H    amiodarone (CORDARONE) 450 mg in dextrose 5 % 250 mL infusion (Vial2Bag)  0.5 mg/min IntraVENous Continuous    midazolam (VERSED) 100mg /125mL in NS infusion  1-10 mg/hr IntraVENous Continuous    fludrocortisone (FLORINEF) tablet 0.1 mg  0.1 mg Oral Daily    glucose chewable tablet 16 g  4 tablet Oral PRN    dextrose bolus 10% 125 mL  125 mL IntraVENous PRN    Or    dextrose bolus 10% 250 mL  250 mL IntraVENous PRN    glucagon (rDNA) injection 1 mg  1 mg SubCUTAneous PRN    dextrose 10 % infusion   IntraVENous Continuous PRN    insulin NPH (HumuLIN N;NovoLIN N) injection vial 5 Units  5 Units SubCUTAneous 2 times per day    insulin lispro (HUMALOG) injection vial 0-4 Units  0-4 Units SubCUTAneous 4 times per day    insulin lispro (HUMALOG) injection vial 0-4 Units  0-4 Units SubCUTAneous Nightly    norepinephrine (LEVOPHED) 16 mg in sodium chloride 0.9 %  250 mL infusion  1-100 mcg/min IntraVENous Continuous    vasopressin 20 Units in sodium chloride 0.9 % 100 mL infusion  0.03 Units/min IntraVENous Continuous    sodium chloride flush 0.9 % injection 5-40 mL  5-40 mL IntraVENous 2 times per day    sodium chloride flush 0.9 % injection 5-40 mL  5-40 mL IntraVENous PRN    0.9 % sodium chloride infusion   IntraVENous PRN    acetaminophen (TYLENOL) tablet 650 mg  650 mg Oral Q6H PRN    Or    acetaminophen (TYLENOL) suppository 650 mg  650 mg Rectal Q6H PRN    ondansetron (ZOFRAN) injection 4 mg  4 mg IntraVENous Q6H PRN    chlorhexidine (PERIDEX) 0.12 % solution 15 mL  15 mL Mouth/Throat BID    pantoprazole (PROTONIX) 40 mg in sodium chloride (PF) 0.9 % 10 mL injection  40 mg IntraVENous Q12H    hydrocortisone sodium succinate PF (SOLU-CORTEF) injection 50 mg  50 mg IntraVENous Q6H    anidulafungin (ERAXIS) 100 mg in sodium chloride 0.9 % 130 mL IVPB  100 mg IntraVENous Q24H    fentanyl (SUBLIMAZE) infusion 1000  mcg/169mL  25-200 mcg/hr IntraVENous Continuous    phenylephrine (NEO-SYNEPHRINE) 50 mg in sodium chloride 0.9 % 250 mL infusion (Vial2Bag)  10-300 mcg/min IntraVENous Continuous    thiamine (B-1) 200 mg in sodium chloride 0.9 % 100 mL IVPB  200 mg IntraVENous BID    prismaSol BGK 2/3.5 dialysis solution   Dialysis Continuous       Review of Symptoms:  He cannot communicate.     Objective:      Physical Exam:  Temp (24hrs), Avg:99.6 F (37.6 C), Min:97.6 F (36.4 C), Max:100.9 F (38.3 C)    Patient Vitals for the past 8 hrs:   Pulse   06/03/22 1500 98   06/03/22 1400 (!) 102   06/03/22 1300 (!) 107   06/03/22 1217 (!) 101   06/03/22 1200 100   06/03/22 1100 66   06/03/22 1042 61   06/03/22 1000 61   06/03/22 0900 59   06/03/22 0826 61   06/03/22 0800 62      Patient Vitals for the past 8 hrs:   Resp   06/03/22 1500 20   06/03/22 1400 20   06/03/22 1300 20   06/03/22 1217 20   06/03/22 1200 20   06/03/22 1100 22   06/03/22 1042 20   06/03/22 1000 20   06/03/22 0900 20   06/03/22 0826 20   06/03/22 0800 20      Patient Vitals for the past 8 hrs:   BP   06/03/22 1500 (!) 173/150   06/03/22 1400 (!) 181/94   06/03/22 1300 (!) 147/90   06/03/22 0900 (!) 137/58   06/03/22 0800 (!) 124/59          Intake/Output Summary (Last 24 hours) at 06/03/2022 1556  Last data filed at 06/03/2022 1400  Gross per 24 hour   Intake 2809.48 ml   Output 2865 ml   Net -55.52 ml         Nondiaphoretic, not in acute distress.  Intubated and sedated.  No scleral icterus, mucous membranes moist, conjuctivae pink, no xanthelasma.  Unlabored, clear to auscultation bilaterally anteriorly, symmetric air movement.  Irregular rate and rhythm, + soft systolic murmur, no pericardial rub.  No peripheral edema.  Extremities without cyanosis or clubbing.  Muscle tone and bulk normal.  Skin warm and dry.  No rashes or ulcers visible.  Neuro grossly nonfocal.  No tremor.  Awake and appropriate.    CARDIOGRAPHICS and STUDIES, I reviewed:    Telemetry:  Atrial  fibrillation.    ECG:  Studies reviewed from the last 24 hours.    Echo 8/1:  Left Ventricle Normal left ventricular systolic function with a visually estimated EF of 55 - 60%. Not well visualized. Left ventricle size is normal. Increased wall thickness. Unable to assess wall motion. Diastolic dysfunction present with normal LV EF.   Left Atrium Not well visualized.   Right Ventricle Not well visualized. Normal systolic function.   Right Atrium Not well visualized.   Aortic Valve Not well visualized. No regurgitation. No stenosis.   Mitral Valve Not well visualized. No regurgitation. No stenosis noted.   Tricuspid Valve Not well visualized. No regurgitation. No stenosis noted.   Pulmonic Valve The pulmonic valve was not well visualized.   Aorta Not well visualized. Normal sized sinus of Valsalva.   Pericardium No pericardial effusion.       Labs:  No results for input(s): CPK, CKMB in the last 72 hours.    Invalid input(s): CPKMB, CKNDX, TROIQ  No results found for: CHOL, CHOLX, CHLST, CHOLV, HDL, HDLC, LDL, LDLC, TGLX, TRIGL  Recent Labs     06/01/22  0003 06/02/22  0405   INR 1.4* 2.0*        Recent Labs     06/01/22  1554 06/01/22  2208 06/02/22  0405 06/02/22  0725 06/02/22  1619 06/03/22  0403   NA  --    < > 139  140 139 136 136   K  --    < > 5.0  5.0 4.7 4.2 4.2   CL  --    < > 106  106 106 104 105   CO2  --    < > 20*  20* 23 23 21    BUN  --    < > 68*  69* 68* 53* 48*   PHOS  --   --  5.8*  --  4.3 3.8   WBC 16.8*  --  26.8*  --   --  29.3*   HGB 13.5  --  13.0  --   --  12.2   HCT 41.5  --  39.6  --   --  36.4*   PLT 33*  --  32*  --   --  27*    < > = values in this interval not displayed.       Recent Labs     06/01/22  0618 06/02/22  0405 06/03/22  0403   GLOB 2.4 3.3 3.5       No components found for: [facility]  No results for input(s): PH, PCO2, PO2 in the last 72 hours.      [facility], MD 06/03/22 3:56 PM

## 2022-06-03 NOTE — Progress Notes (Addendum)
0730: Report received from [place], [place].    [phone]: EKG obtained for possible ST changes. Dr. Allison Quarry and Dr. Marcha Dutton reviewed EKG together. No new orders received.     0800: Pt. Assessed. Pt. Intubated and sedated (Versed at 2 mg, Fentanyl at 100 mcgs). Pt. Responds easily to pain. Gag present. Pupils slightly unequal, Will make Md aware. Brisk and round. Pt. On the ventilator. ETT present, 50%, Peep of 7. Settings unchanged from previous shift. Oxygen saturation 95%. Breath sounds slightly coarse in the upper airways. Bowel sounds hypoactive. Pt. Has an NGT, brown OP. Pt. Also has a CVC and Quinton both in use and patent. CRRT in progress. O Factor. Pt. Tolerating it well. Levophed (35 mcgs) and Vasopressin (0.03 mcgs) in use to keep a MAP above 65. Pt. Also has an a-line, Waveform appropriate at this time, zeroed, leveled, and flushed. Pedal pulses doppler-able, not palpable. Md aware. Feet cold. Temp 99.3 Axillary. Pt. Has a midline incision to ABD, 2 JP drains present as well to the ABD, serosanguinous OP. Pt. Currently in Bl wrist restraints per MD.     1100: Pt. Now AFIB, rate in the 140's. Will obtain KEG and make MD aware.      1115: Dr. Allison Quarry at the bedside. MD stated no EKG at this time. Rate now 98-110. Per MD, only intervene if HR sustains above 115.     1247: Levophed increased to 40 mcgs due to some hypotension after turning. Dr. Allison Quarry at the bedside and updated on patient condition and also new finding of unequal pupils.No new orders received.     1400: Pt.'s A-line and Cuff pressure are not correlating. All interventions attempted to fix this finding (flushing, power flushing, repositioning, zeroing). Dr. Allison Quarry aware. Was told to Titrate Vasopressors to the cuff pressure and NOT the A-line pressure. See MAR for rates and titrations of Vasopressors. A-line alarms now turned off per Dr. Allison Quarry. Can leave A-line for blood draws. Do not remove A-line without an order, as patient's platelets are very  low.    1500: [facility] at the bedside changing out CRRT circuit.     1522: CRRT labs and Lactic now drawn and sent.     1600: Pt. Reassessed. No real changes. All dressings now changed again, as patient is oozing from his CVC and Quinton. All wound care dressings changed on ABD. Pt. Bathed. Pt. Tolerated it well. Levo now at 25 mcgs. All other infusions remain the same. Dr. Allison Quarry aware of Lactic of 2.5. No new orders. Received regarding this.     1800: Pt. Now back into NSR (irregular at times). A-line pressure and Cuff pressure still do not correlate. Cuff pressure the same on both arms.     1900: Report given to [facility], Charity fundraiser.

## 2022-06-03 NOTE — Other (Signed)
[name]  [facility]  DIABETES MANAGEMENT CONSULT    Consulted by Provider for advanced nursing evaluation and care for inpatient blood glucose management.    Evaluation and Action Plan   This 76 year old Caucasian male was admitted with RUQ pain and underwent laparotomy 06/01/22. Septic shock with multi-organ failure. Weaning full support. As for BG management in this patient with known Type 2 diabetes, began low dose basal insulin yesterday along with corrective insulin to address impact of steroid. Bgs 170-180s. No change indicated.    Management Rationale Action Plan   Medication   Overriding steroid effect Use 0.2 units/kg/D based on steroid dose Begin NPH insulin 5 units twice daily   Additional orders             Initial Presentation   Jaime [name] is a 76 y.o. male admitted 05/31/22 from ER after experiencing RUQ pain associated with generalized weakness, nausea & vomiting for several days PTA. AMS+. Hypotension+. Fever+. Dyspneic+. He is visiting from [place].  Ectopic atrial tachycardia  ER exam:  Cardiovascular:      Rate and Rhythm: Regular rhythm. Tachycardia present.   Pulmonary:      Comments: He is tachypneic, coarse breath sounds on inspiration at bilateral bases  Abdominal:      Palpations: Abdomen is soft.      Comments: Tender to palpation diffusely in the right sided abdomen without guarding or rebound tenderness   LAB: WBC 16.6. Normal H&H. Low platelets.BG 272/AG 17. AKI. Elevated liver enzymes. NT pro-BNP 20,980. Lipase >3000.     CXR:   Right IJ catheter satisfactory position without pneumothorax. ET   tube and NG tube as above   CT Head: Negative  CT ABd:   Extraluminal air bubbles in the right upper quadrant concerning for gastric   perforation. Inflammatory changes are noted about the tail of the pancreas   extending into the left anterior pararenal space, please correlate with any   evidence of acute pancreatitis. Bilateral lower lobe infiltrates.     HX:No  past medical history on file.     INITIAL DX: Septicemia (HCC) [A41.9]  Gastric perforation (HCC) [K25.5]  Perforated abdominal viscus [R19.8]     Current Treatment     TX: 06/01/22 LAPAROTOMY EXPLORATORY  LAPAROSCOPY DIAGNOSTIC  ENDOSCOPY OF ILEAL CONDUIT  EGD    Hospital Course   Clinical progress has been complicated by multi-organ failure.   06/01/22 Dialysis  06/02/22 In septic shock with multi-organ failure. On vent support, sedation, pressor support and bicarb infusion. On Abx. Two runs of VT this morning requiring defibrillation; cardiology conversation anticipated. CRRT+  06/03/22 Remains on vent support C vent Fi02 50%/Peep 7. Low grade fever; on Abx. Steroids+. Afib/bradycardic. Pressors+. CRRT+.     Diabetes History   Type 2 diabetes on metformin therapy    Diabetes-related Medical History deferred    Diabetes Medication History - based on PTA list  Drug class Currently in use   Biguanide [x]  Metformin (Glucophage)  []  Metformin ER (Glucophage XL)     Diabetes self-management practices: deferred  Overall evaluation:    [x]  Unknown A1c     Subjective   NA     Objective   Physical exam  General Obese male who is ill-appearing  Neuro  Sedated  Vital Signs   Vitals:    06/03/22 1000   BP:    Pulse: 61   Resp: 20   Temp:    SpO2: 96%  Extremities No foot wounds    Diabetic foot exam:    Left Foot     Visual Exam: Cool & dry. Thickened toenails    Pulse DP: +1   Right Foot   Visual Exam: Cool & dry. Thickened toenails    Pulse DP: +1      Laboratory  Recent Labs     06/01/22  1554 06/02/22  0405 06/03/22  0403   WBC 16.8* 26.8* 29.3*   HGB 13.5 13.0 12.2   HCT 41.5 39.6 36.4*   MCV 91.6 91.9 89.0   PLT 33* 32* 27*       Recent Labs     06/02/22  0405 06/02/22  0725 06/02/22  1619 06/02/22  1818 06/03/22  0001 06/03/22  0403   NA 139  140 139 136  --   --  136   K 5.0  5.0 4.7 4.2  --   --  4.2   CL 106  106 106 104  --   --  105   CO2 20*  20* 23 23  --   --  21   PHOS 5.8*  --  4.3  --   --  3.8   BUN 68*   69* 68* 53*  --   --  48*   CREATININE 4.11*  4.16* 3.93* 3.17* 3.3* 3.1* 2.73*       Lab Results   Component Value Date    ALT 1,519 (H) 06/03/2022    AST 1,419 (H) 06/03/2022    ALKPHOS 440 (H) 06/03/2022    BILITOT 8.3 (H) 06/03/2022     No results found for: TSH, TSHREFLEX, TSHFT4, TSHELE, TSH3GEN, TSHHS   Lab Results   Component Value Date    LABA1C 7.7 (H) 06/02/2022     Factors impacting BG management  Factor Dose Comments   Nutrition:  NPO       Drugs:  Vasopressor load  Steroids   Levo/Vasopressin infusions  HC Q6 hrs   Affects insulin delivery  Impairs insulin action   Pain Fent & Versed infusions    Infection Eraxis Q24 hrs  Rocephin Q12 hrs WBCs rising   Other:   Kidney function  Liver function   AKI  Liver enzymes markedly elevated      Blood glucose pattern      Significant diabetes-related events over the past 24-72 hours  05/31/22 Admission BG 233  06/01/22 Dex 4mg  => BG 100s => HC started  06/02/22 Bgs into 200s  06/03/22 Bgs in 170-180s    Assessment and Nursing Intervention   Nursing Diagnosis Risk for unstable blood glucose pattern   Nursing Intervention Domain 5250 Decision-making Support   Nursing Interventions Examined current inpatient diabetes/blood glucose control   Explored factors facilitating and impeding inpatient management  Explored corrective strategies with patient and responsible inpatient provider   Informed patient of rational for insulin strategy while hospitalized     Billing Code(s)   []  99233 IP subsequent hospital care - 50 minutes   [x]  99232 IP subsequent hospital care - 35 minutes   []  99231 IP subsequent hospital care - 25 minutes   []  99221 IP initial hospital care - 40 minutes     Before making these care recommendations, I personally reviewed the hospitalization record, including notes, laboratory & diagnostic data and current medications, and examined the patient at the bedside (circumstances permitting) before determining care. More than fifty (50) percent of the time  was spent in patient  counseling and/or care coordination.  Total minutes: 35    Bess Harvest, APRN - CNS  Clinical Nurse Specialist - Diabetes & endocrine disorders  [facility]  Access via [employer]

## 2022-06-03 NOTE — Progress Notes (Signed)
SURGERY PROGRESS NOTE      Admit Date: 05/31/2022    POD 2 Days Post-Op    Procedure: Procedure(s):  LAPAROTOMY EXPLORATORY  LAPAROSCOPY DIAGNOSTIC  ENDOSCOPY OF ILEAL CONDUIT      Subjective:     Patient remains intubated and sedated.        Objective:     BP (!) 137/58   Pulse 61   Temp 99.3 F (37.4 C) (Axillary)   Resp 20   Ht 1.727 m (5' 7.99")   Wt 101.6 kg (224 lb)   SpO2 96%   BMI 34.07 kg/m      Temp (24hrs), Avg:99.6 F (37.6 C), Min:97.6 F (36.4 C), Max:100.9 F (38.3 C)      08/02 0701 - 08/02 1900  In: 481.5 [I.V.:214.8]  Out: 281 [Drains:90]  07/31 1901 - 08/02 0700  In: 6132.5 [I.V.:5703.8]  Out: 5500 [Urine:21; Drains:573]    Physical Exam:    General:  Intubated and sedated    Abdomen: Soft, tender, hypoactive BS, JP 265cc/24 hours serosanguinous    Incision:   Dressing clean,Dry, intact          CBC:   Lab Results   Component Value Date/Time    WBC 29.3 06/03/2022 04:03 AM    RBC 4.09 06/03/2022 04:03 AM    HGB 12.2 06/03/2022 04:03 AM    HCT 36.4 06/03/2022 04:03 AM    MCV 89.0 06/03/2022 04:03 AM    MCH 29.8 06/03/2022 04:03 AM    MCHC 33.5 06/03/2022 04:03 AM    RDW 14.7 06/03/2022 04:03 AM    PLT 27 06/03/2022 04:03 AM    MPV ABNORMAL 06/03/2022 04:03 AM     BMP:    Lab Results   Component Value Date/Time    NA 136 06/03/2022 04:03 AM    K 4.2 06/03/2022 04:03 AM    CL 105 06/03/2022 04:03 AM    CO2 21 06/03/2022 04:03 AM    BUN 48 06/03/2022 04:03 AM    LABALBU 2.1 06/03/2022 04:03 AM    CREATININE 2.73 06/03/2022 04:03 AM    CALCIUM 8.7 06/03/2022 04:03 AM    LABGLOM 24 06/03/2022 04:03 AM    GLUCOSE 180 06/03/2022 04:03 AM       Assessment:     Principal Problem:    Gastric perforation (HCC)  Active Problems:    Type 2 diabetes mellitus with hyperglycemia, without long-term current use of insulin (HCC)  Resolved Problems:    * No resolved hospital problems. *    Septic in multiorgan failure.  Source unclear.  Marginally improved in the last 24 hours.     Plan:     Continue  supportive care

## 2022-06-03 NOTE — Other (Signed)
06/03/22 1507   Vital Signs   BP 115/73   Pulse 97   Respirations 20   During Hemodialysis Assessment   ABP (Arterial line BP) (!) 77/39 - inaccurate   Patient intubated and sedated  2+ edema generalized; 2+ pitting Bilat Legs and thighs       06/03/22 1515   Treatment   $CRRT $Yes   Machine #   ([id number])   Pressures   Access (mmHg) -86 mmHg   Return/Venous (mmHg) 196 mmHg   Effluent (mmHg) 200 mmHg   Filter (mmHg) 315 mmHg   TMP Pressure (mmHg) 16 mmHg   Pressure Drop (mmHg) 73 mmHg   Deaeration Chamber Check Yes   Flow Rates   Blood Flow (mL/min) 250 mL/min   Dialysate (mL/hr) 2550 mL/hr   Non-CRRT Intake   IV/IVPB (mL) 77 mL   PO (mL) 0   Total Non-CRRT Intake (Calculated) 77   Non-CRRT Output   Urine (mL) 0   NG/OG (mL) 0 mL   Wound Drain (mL) 0 mL   Total Non-CRRT Output  (Calculated) 0 mL   NxStage Calculation   (A) Total Non-NxStage Intake (mL) 77 mL   (C) Total Non-NxStage Output (mL) 0 mL/hr   Prisma Calculation   (A) Total Non-Prisma Intake (mL) 77 mL   (B) Total Non-Prisma Output (mL) 0 mL   (C) Balance (A-B) 77 ml   (D) Physician Ordered Hourly Removal (mL) 0 ml   (E) Amount of UF removed last hour 0 ml  (patient was off machine at 1400 to change set)   (F) Amount Programmed to Remove (H from last hour) 80   (G) Amount ahead or behind (E-F) -80   (H) Set the removal rate for the next hour (C+D-G) 157   (I) Actual Fluid Balance (C-E) 77   Hemodialysis Central Access Right Femoral vein   Placement Date/Time: 06/01/22 1830   Present on Admission/Arrival: No  Inserted by: Dr. Christel Mormon  Insertion Practices: Chlorohexadine skin antisepsis;Maximal barrier precautions;Sterile ultrasound technique;Optimal catheter site selection;Betadine...   Continued need for line? Yes   Site Assessment Clean, dry & intact   Venous Lumen Status Infusing   Arterial Lumen Status Infusing   Alcohol Cap Used No   Line Care Connections checked and tightened;Ports disinfected   Dressing Type Bacteriocidal;Transparent   Date of  Last Dressing Change 06/03/22   Dressing Status New dressing applied  (by Mignon Pine)   Dressing Intervention New   Dressing Change Due 06/05/22     Primary RN SBAR: Illene Labrador, RN  Patient Education: primary RN; machine settings/ procedural  Hepatitis B Surface Ag   Date/Time Value Ref Range Status   06/01/2022 10:08 PM <0.10 Index Final     Hep B S Ab   Date/Time Value Ref Range Status   06/01/2022 10:08 PM <3.10 mIU/mL Final      Patient rinsed back r/t visible clotting in top of dialyzer and deaeration chamber.   Lines and set drained and discarded in red biohazard bag.  Patient code status, labs, orders reviewed. Machine set up.   Treatment restarted. Dressing changed a few times today by primary RN.   Education provided to primary RN prior to my departure.

## 2022-06-03 NOTE — Plan of Care (Signed)
Problem: Chronic Conditions and Co-morbidities  Goal: Patient's chronic conditions and co-morbidity symptoms are monitored and maintained or improved  Recent Flowsheet Documentation  Taken 06/03/2022 2047 by Lajuana Carry, RN  Care Plan - Patient's Chronic Conditions and Co-Morbidity Symptoms are Monitored and Maintained or Improved:   Monitor and assess patient's chronic conditions and comorbid symptoms for stability, deterioration, or improvement   Collaborate with multidisciplinary team to address chronic and comorbid conditions and prevent exacerbation or deterioration   Update acute care plan with appropriate goals if chronic or comorbid symptoms are exacerbated and prevent overall improvement and discharge

## 2022-06-03 NOTE — Progress Notes (Addendum)
1900: Bedside shift change report given to [facility] and Romie Minus (Cabin crew) by Maralyn Sago (offgoing nurse). Report included the following information Nurse Handoff Report, Adult Overview, Surgery Report, Intake/Output, MAR, Recent Results, and Cardiac Rhythm Sinus Arrhythmia .      2047: Assessment completed. Patient is intubated and sedated. Patient does not withdraw from pain in his extremities. Bites ETT and suction toothette when performing oral care. Pupils nonreactive. R pupil 61mm. Left pupil 52mm. Sclera yellow and edematous. Weak cough and gag present. Lung sounds: clear and diminished. Cardiac rhythm: sinus arrhythmia. +1/trace generalized edema. BUE and BLE pulses weak. Doppler used on BLE. Skin is pale and jaundiced. Abdomen soft. Incisions and 2 JP drains present. Hypoactive bowel sounds. Patient is anuric - on CVVH. Patient has CVC, Quinton, PIVs,  Arterial line, ETT and NGT.     0000: Reassessment completed. See flow sheets.    0345: Critical PLT: 30 communicated to [place], NP. No new orders.     0400: Reassessment completed. See flow sheets.     0700: Bedside shift change report given to [employer] and instrumentation engineer) by [employer] and [employer] (offgoing nurse). Report included the following information Nurse Handoff Report, Adult Overview, Surgery Report, Intake/Output, MAR, Recent Results, Cardiac Rhythm Sinus Arrhythmia, and Event Log.

## 2022-06-03 NOTE — Progress Notes (Signed)
1912:Bedside and Verbal shift change report received from Sarah,RN. Report included the following information Nurse Handoff Report, ED Encounter Summary, ED SBAR, Surgery Report, Intake/Output, MAR, Recent Results, Cardiac Rhythm NSR-Afib, Alarm Parameters, Neuro Assessment, and Event Log.   2000:shift assessment completed; see flow sheets  0000:reassessment completed; see flow sheets  0400:Reassessment completed; see flow sheets  0704:Bedside shift change report given to Worth,RN (Cabin crew) by Danae Chen, RN (offgoing nurse).  Report included the following information SBAR, Kardex, ED Summary, Intake/Output, MAR, and Cardiac Rhythm Sinus brady-sinus rhthym        Danae Chen, RN

## 2022-06-04 ENCOUNTER — Inpatient Hospital Stay: Admit: 2022-06-04 | Payer: MEDICARE

## 2022-06-04 LAB — EKG 12-LEAD
Atrial Rate: 108 {beats}/min
Atrial Rate: 129 {beats}/min
Atrial Rate: 300 {beats}/min
Atrial Rate: 59 {beats}/min
P Axis: 45 degrees
P-R Interval: 162 ms
Q-T Interval: 294 ms
Q-T Interval: 326 ms
Q-T Interval: 364 ms
Q-T Interval: 498 ms
QRS Duration: 80 ms
QRS Duration: 82 ms
QRS Duration: 74 ms
QRS Duration: 82 ms
QTc Calculation (Bazett): 406 ms
QTc Calculation (Bazett): 479 ms
QTc Calculation (Bazett): 493 ms
QTc Calculation (Bazett): 537 ms
R Axis: 29 degrees
R Axis: 32 degrees
R Axis: 35 degrees
R Axis: 48 degrees
T Axis: 76 degrees
T Axis: 77 degrees
T Axis: 63 degrees
T Axis: 79 degrees
Ventricular Rate: 115 {beats}/min
Ventricular Rate: 130 {beats}/min
Ventricular Rate: 131 {beats}/min
Ventricular Rate: 59 {beats}/min

## 2022-06-04 LAB — POCT GLUCOSE
POC Glucose: 195 mg/dL — ABNORMAL HIGH (ref 65–100)
POC Glucose: 195 mg/dL — ABNORMAL HIGH (ref 65–100)
POC Glucose: 208 mg/dL — ABNORMAL HIGH (ref 65–100)
POC Glucose: 207 mg/dL — ABNORMAL HIGH (ref 65–100)

## 2022-06-04 LAB — BASIC METABOLIC PANEL
Anion Gap: 9 mmol/L (ref 5–15)
Anion Gap: 10 mmol/L (ref 5–15)
BUN: 48 MG/DL — ABNORMAL HIGH (ref 6–20)
BUN: 49 MG/DL — ABNORMAL HIGH (ref 6–20)
Bun/Cre Ratio: 22 — ABNORMAL HIGH (ref 12–20)
Bun/Cre Ratio: 25 — ABNORMAL HIGH (ref 12–20)
CO2: 21 mmol/L (ref 21–32)
CO2: 21 mmol/L (ref 21–32)
Calcium: 8.7 MG/DL (ref 8.5–10.1)
Calcium: 8.2 MG/DL — ABNORMAL LOW (ref 8.5–10.1)
Chloride: 105 mmol/L (ref 97–108)
Chloride: 106 mmol/L (ref 97–108)
Creatinine: 2.23 MG/DL — ABNORMAL HIGH (ref 0.70–1.30)
Creatinine: 1.99 MG/DL — ABNORMAL HIGH (ref 0.70–1.30)
Est, Glom Filt Rate: 30 mL/min/{1.73_m2} — ABNORMAL LOW (ref >60)
Est, Glom Filt Rate: 34 mL/min/{1.73_m2} — ABNORMAL LOW (ref >60)
Glucose: 198 mg/dL — ABNORMAL HIGH (ref 65–100)
Glucose: 205 mg/dL — ABNORMAL HIGH (ref 65–100)
Potassium: 3.9 mmol/L (ref 3.5–5.1)
Potassium: 3.7 mmol/L (ref 3.5–5.1)
Sodium: 135 mmol/L — ABNORMAL LOW (ref 136–145)
Sodium: 137 mmol/L (ref 136–145)

## 2022-06-04 LAB — LACTIC ACID
Lactic Acid, Plasma: 2 MMOL/L (ref 0.4–2.0)
Lactic Acid, Plasma: 1.3 MMOL/L (ref 0.4–2.0)
Lactic Acid, Plasma: 1.3 MMOL/L (ref 0.4–2.0)

## 2022-06-04 LAB — POCT BLOOD GAS & ELECTROLYTES
Anion Gap, POC: 7 — ABNORMAL LOW (ref 10–20)
BASE DEFICIT (POC): 1.8 mmol/L
HCO3, Arterial: 22 mmol/L
POC Chloride: 109 MMOL/L — ABNORMAL HIGH (ref 100–108)
POC Creatinine: 1.9 MG/DL — ABNORMAL HIGH (ref 0.6–1.3)
POC Glucose: 201 MG/DL — ABNORMAL HIGH (ref 74–106)
POC Ionized Calcium: 1.22 mmol/L (ref 1.12–1.32)
POC Lactic Acid: 1.38 mmol/L (ref 0.40–2.00)
POC O2 SAT: 96 %
POC Potassium: 3.5 MMOL/L (ref 3.5–5.5)
POC Sodium: 137 MMOL/L (ref 136–145)
POC TCO2: 21 MMOL/L (ref 19–24)
eGFR, POC: 36 mL/min/{1.73_m2} — ABNORMAL LOW (ref >60)
pCO2, Arterial, POC: 30.9 MMHG — ABNORMAL LOW (ref 35.0–45.0)
pH, Arterial, POC: 7.45 (ref 7.35–7.45)
pO2, Arterial, POC: 75 MMHG — ABNORMAL LOW (ref 80–100)

## 2022-06-04 LAB — CBC
Hematocrit: 32.7 % — ABNORMAL LOW (ref 36.6–50.3)
Hematocrit: 30.6 % — ABNORMAL LOW (ref 36.6–50.3)
Hemoglobin: 11 g/dL — ABNORMAL LOW (ref 12.1–17.0)
Hemoglobin: 10.4 g/dL — ABNORMAL LOW (ref 12.1–17.0)
MCH: 29.9 PG (ref 26.0–34.0)
MCH: 30.2 PG (ref 26.0–34.0)
MCHC: 33.6 g/dL (ref 30.0–36.5)
MCHC: 34 g/dL (ref 30.0–36.5)
MCV: 88.9 FL (ref 80.0–99.0)
MCV: 89 FL (ref 80.0–99.0)
MPV: ABNORMAL FL (ref 8.9–12.9)
MPV: ABNORMAL FL (ref 8.9–12.9)
Nucleated RBCs: 0.4 PER 100 WBC — ABNORMAL HIGH
Nucleated RBCs: 0.4 PER 100 WBC — ABNORMAL HIGH
Platelets: 30 10*3/uL — CL (ref 150–400)
Platelets: 31 10*3/uL — CL (ref 150–400)
RBC: 3.68 M/uL — ABNORMAL LOW (ref 4.10–5.70)
RBC: 3.44 M/uL — ABNORMAL LOW (ref 4.10–5.70)
RDW: 14.7 % — ABNORMAL HIGH (ref 11.5–14.5)
RDW: 14.9 % — ABNORMAL HIGH (ref 11.5–14.5)
WBC: 31.3 10*3/uL — ABNORMAL HIGH (ref 4.1–11.1)
WBC: 29.9 10*3/uL — ABNORMAL HIGH (ref 4.1–11.1)
nRBC: 0.14 10*3/uL — ABNORMAL HIGH (ref 0.00–0.01)
nRBC: 0.13 10*3/uL — ABNORMAL HIGH (ref 0.00–0.01)

## 2022-06-04 LAB — MAGNESIUM
Magnesium: 2.2 mg/dL (ref 1.6–2.4)
Magnesium: 2.1 mg/dL (ref 1.6–2.4)

## 2022-06-04 LAB — LIPASE BODY FLUID: Lipase, Fluid: 815 U/L

## 2022-06-04 LAB — PHOSPHORUS
Phosphorus: 3.6 MG/DL (ref 2.6–4.7)
Phosphorus: 3.5 MG/DL (ref 2.6–4.7)

## 2022-06-04 LAB — AMYLASE, BODY FLUID: Amylase, Fluid: 209 U/L

## 2022-06-04 LAB — LIPASE: Lipase: 130 U/L (ref 73–393)

## 2022-06-04 MED ORDER — IOPAMIDOL 76 % IV SOLN
76 % | Freq: Once | INTRAVENOUS | Status: AC | PRN
Start: 2022-06-04 — End: 2022-06-04
  Administered 2022-06-04: 22:00:00 100 mL via INTRAVENOUS

## 2022-06-04 MED ORDER — METRONIDAZOLE 500 MG/100ML IV SOLN
500 | Freq: Three times a day (TID) | INTRAVENOUS | Status: DC
Start: 2022-06-04 — End: 2022-06-05
  Administered 2022-06-04 – 2022-06-05 (×4): 500 mg via INTRAVENOUS

## 2022-06-04 MED ORDER — BALSAM PERU-CASTOR OIL TOPICAL OINTMENT
Freq: Two times a day (BID) | CUTANEOUS | Status: DC
Start: 2022-06-04 — End: 2022-08-03
  Administered 2022-06-04 – 2022-08-03 (×118): via TOPICAL

## 2022-06-04 MED ORDER — HYDROCORTISONE SOD SUC (PF) 100 MG IJ SOLR (ACT-0-VIAL)
100 | Freq: Three times a day (TID) | INTRAMUSCULAR | Status: DC
Start: 2022-06-04 — End: 2022-06-05
  Administered 2022-06-04 – 2022-06-05 (×3): 50 mg via INTRAVENOUS

## 2022-06-04 MED ORDER — INSULIN NPH (HUMAN) (ISOPHANE) 100 UNIT/ML SC SUSP
100 UNIT/ML | Freq: Two times a day (BID) | SUBCUTANEOUS | Status: AC
Start: 2022-06-04 — End: 2022-06-05
  Administered 2022-06-05: 01:00:00 6 [IU] via SUBCUTANEOUS

## 2022-06-04 MED ORDER — SODIUM CHLORIDE 0.9 % IV BOLUS
0.9 % | Freq: Once | INTRAVENOUS | Status: AC
Start: 2022-06-04 — End: 2022-06-04
  Administered 2022-06-04: 14:00:00 500 mL via INTRAVENOUS

## 2022-06-04 MED ORDER — MIDAZOLAM HCL (PF) 5 MG/5ML IJ SOLN
5 MG/ML | INTRAMUSCULAR | Status: AC | PRN
Start: 2022-06-04 — End: 2022-06-05

## 2022-06-04 MED ORDER — NOREPINEPHRINE-SODIUM CHLORIDE 16-0.9 MG/250ML-% IV SOLN
16-0.9 | INTRAVENOUS | Status: DC
Start: 2022-06-04 — End: 2022-06-07
  Administered 2022-06-05: 21:00:00 6 ug/min via INTRAVENOUS
  Administered 2022-06-06: 02:00:00 7 ug/min via INTRAVENOUS
  Administered 2022-06-06: 02:00:00 8 ug/min via INTRAVENOUS

## 2022-06-04 MED FILL — METRONIDAZOLE 500 MG/100ML IV SOLN: 500 MG/100ML | INTRAVENOUS | Qty: 100

## 2022-06-04 MED FILL — AMIODARONE HCL 450 MG/9ML IV SOLN: 450 MG/9ML | INTRAVENOUS | Qty: 9

## 2022-06-04 MED FILL — SOLU-CORTEF 100 MG IJ SOLR: 100 MG | INTRAMUSCULAR | Qty: 2

## 2022-06-04 MED FILL — HUMALOG 100 UNIT/ML IJ SOLN: 100 UNIT/ML | INTRAMUSCULAR | Qty: 1

## 2022-06-04 MED FILL — MIDAZOLAM HCL 5 MG/5ML IJ SOLN: 5 MG/ML | INTRAMUSCULAR | Qty: 5

## 2022-06-04 MED FILL — BPCO EX OINT: CUTANEOUS | Qty: 60

## 2022-06-04 MED FILL — CHLORHEXIDINE GLUCONATE 0.12 % MT SOLN: 0.12 % | OROMUCOSAL | Qty: 15

## 2022-06-04 MED FILL — FENTANYL CITRATE-NACL 1-0.9 MG/100ML-% IV SOLN: INTRAVENOUS | Qty: 100

## 2022-06-04 MED FILL — PANTOPRAZOLE SODIUM 40 MG IV SOLR: 40 MG | INTRAVENOUS | Qty: 40

## 2022-06-04 MED FILL — CEFTRIAXONE SODIUM 2 G IJ SOLR: 2 g | INTRAMUSCULAR | Qty: 2000

## 2022-06-04 MED FILL — ISOVUE-370 76 % IV SOLN: 76 % | INTRAVENOUS | Qty: 100

## 2022-06-04 MED FILL — ERAXIS 100 MG IV SOLR: 100 MG | INTRAVENOUS | Qty: 30

## 2022-06-04 MED FILL — HUMULIN N 100 UNIT/ML SC SUSP: 100 UNIT/ML | SUBCUTANEOUS | Qty: 1

## 2022-06-04 MED FILL — VASOPRESSIN 20 UNIT/ML IV SOLN: 20 UNIT/ML | IntraVENous | Qty: 1

## 2022-06-04 MED FILL — FLUDROCORTISONE ACETATE 0.1 MG PO TABS: 0.1 MG | ORAL | Qty: 1

## 2022-06-04 NOTE — Progress Notes (Signed)
OVERNIGHT CRITICAL CARE PROGRESS NOTE      Name: Jaime [name]   DOB: 24-May-1946   MRN: [id number]   Date: 06/05/2022        OVERNIGHT EVENTS/ASSESSMENT   CT Abdomen/Pelvis: Enlarged left hepatic abscess contain gas with other areas of probable abscess/phlegmonous infection.   Dr. Marco Collie notified of CT results, spoke with IR who reviewed images, will plan for drain placement tomorrow    Windy Kalata AGACNP  Critical Care Medicine  [facility]

## 2022-06-04 NOTE — Progress Notes (Signed)
[facility] PROGRESS NOTE.      Name: Jaime [name]   DOB: 10-11-1946   MRN: [id number]   Date: 06/04/2022      Chief Complaint   Patient presents with    Hypotension     Hx hypertension - BP is 70s/40s    Altered Mental Status    Nausea    Emesis       Reason for ICU:     Septic shock with multi organ failure  Acute abdomen, s/p emergent exploratory laparotomy 06/01/22    Subjective/Hospital Course:     76 y/o male ([place] [facility] [place]) with PMHx significant of HTN, HLD and T2DM. Came in ED 7/30 noon for N/V/Abd pain and generalized weakess. Found to be encephalopathic, shock state (BP 80s and lactate 10), AKI (Cr 4.06). Received 2L LR, started on Levophed, Vancomycin/Zosyn. Surgery consulted and patient underwent emergent exploratory laparotomy last night that did not reveal perforation but fluid collection in right upper quadrant that was sent for cultures and sensitivity.   7/31/: Patient transferred to ICU post op early AM. Increasing pressor needs, shock liver, thrombocytopenia, coagulopathic, anuric AKI with developing hyperkalemia and started on [facility]. Stress dose steroids and thiamine added for refractory shock. Abx changed to Vanc and merrem.   06/02/22: VT episodes with pulse, shocked x 3 and amio boluses 150 mg each x 3 fallowed by amio drip. Cardio consulted.   8/3: Intubated and sedated. ON of levophed and vasopressin, on 50% Fio2 and PEEP of 7. CRRT ongoing with no factor. RASS neg 5. On 0.5mg /hr of versed gtt. Which is I turned off during rounds.       Principle ICU Diagnosis     # Septic shock, 2/2 intra abdominal source.    # Acute abdomen from suspected abdominal viscus perforation   S/p emergent exploratory laparotomy 06/01/22    Stress dose steroids and thiamine added 06/01/22   Fludrocortisone added 06/02/22   Vanc stopped 06/02/22. Currently on merrem and eraxis   BC growing enterobacter and e coli 7/31   Abd fluid NGTD   Improving pressor needs noted today     # Elevated troponin,  likely demand ischemia with non specific EKG changes  # VT with further hemodynamic compromise 06/02/22, controlled now   S/p electric CV x 2 followed by amio bolus 150 mg x2 on 06/01/22 AM   On amio drip    # Acute respiratory failure with hypoxemia and hypercapnia, ventilatory dependant   Intubated 06/01/22    # Acute encephalopathy, likely metabolic and toxic from sepsis  # Propofol infusion toxicity/ intolerance   Rising Cpk TG and cardiac arrythmia noted 06/02/22   Propofol changed to versed 06/02/22    # Acute Kidney injury, anuric. Likely ATN   CVVHDF initiated 06/01/22 through right Fem NTC    # Multi organ failure including shock liver  # Coagulopathy, elevated PT, INR with no evidence of obvious bleeding  # Thrombocytopenia, Likely from sepsis.   Fibrinogen 651    Plan  - Continue stress doe steroids for now and wean levophed for MAP > 65  - DC versed gtt. Can start fentanyl gtt if needed.  - Continue lung protective vent support per protocol  - On rocephin and eraxis. Has worsening white count, will start flagyl. Has ecoli bacteremia.   - Continue CVVHDF per nephrology, zero net fluid removal for now      I personally spent 60 minutes of critical care time.  This is time spent at this critically ill patient's bedside actively involved in patient care as well as the coordination of care and discussions with the patient's family.  This does not include any procedural time which has been billed separately.       CRITICAL CARE CONSULTANT NOTE  I had a face to face encounter with the patient, reviewed and interpreted patient data including clinical events, labs, images, vital signs, I/O's, and examined patient.  I have discussed the case and the plan and management of the patient's care with the consulting services, the bedside nurses and the respiratory therapist.      NOTE OF PERSONAL INVOLVEMENT IN CARE   This patient has a high probability of imminent, clinically significant deterioration, which requires the  highest level of preparedness to intervene urgently. I participated in the decision-making and personally managed or directed the management of the following life and organ supporting interventions that required my frequent assessment to treat or prevent imminent deterioration.    Modena Slater, MD   [facility]  [phone]  06/04/2022      Review of systems:     Review of Systems   Unable to obtain, patient is intubated and sedated.     Objective:     Vital Signs:  BP (!) 117/55   Pulse 60   Temp 99.1 F (37.3 C) (Oral)   Resp 20   Ht 1.727 m (5' 7.99")   Wt 103.7 kg (228 lb 9.9 oz)   SpO2 97%   BMI 34.77 kg/m      Temp (24hrs), Avg:98.8 F (37.1 C), Min:97.3 F (36.3 C), Max:99.5 F (37.5 C)           Intake/Output:     Intake/Output Summary (Last 24 hours) at 06/04/2022 0914  Last data filed at 06/04/2022 0900  Gross per 24 hour   Intake 1853.79 ml   Output 2011 ml   Net -157.21 ml         Physical Exam  Constitutional:       Appearance: He is ill-appearing and toxic-appearing.   HENT:      Head: Normocephalic and atraumatic.   Eyes:      Pupils: Pupils are equal, round, and reactive to light.   Cardiovascular:      Rate and Rhythm: Normal rate and regular rhythm.      Pulses: Normal pulses.      Heart sounds: Normal heart sounds.   Abdominal:      General: There is distension.   Neurological:      Comments: Unable to assess   Psychiatric:      Comments: Unable to assess     Intubated and sedated.     Past Medical History:        has no past medical history on file.    Past Surgical History:      has a past surgical history that includes laparotomy (N/A, 06/01/2022); laparoscopy (N/A, 06/01/2022); and Bladder surgery (N/A, 06/01/2022).      Home Medications:     Prior to Admission medications    Medication Sig Start Date End Date Taking? Authorizing Provider   metFORMIN (GLUCOPHAGE) 500 MG tablet Take 1 tablet by mouth daily   Yes Historical Provider, MD   metoprolol (LOPRESSOR) 100 MG tablet Take 1  tablet by mouth daily   Yes Historical Provider, MD   hydrALAZINE (APRESOLINE) 25 MG tablet Take 1 tablet by mouth 3 times daily   Yes Historical  Provider, MD   atorvastatin (LIPITOR) 40 MG tablet Take 1 tablet by mouth daily   Yes Historical Provider, MD   losartan (COZAAR) 100 MG tablet Take 1 tablet by mouth daily   Yes Historical Provider, MD         Allergies/Social/Family History:     Allergies   Allergen Reactions    Augmentin [Amoxicillin-Pot Clavulanate] Hives    Codeine      Unknown reaction      Social History     Tobacco Use    Smoking status: Not on file    Smokeless tobacco: Not on file   Substance Use Topics    Alcohol use: Not on file      No family history on file.    LABS AND  DATA:   Reviewed    Peak airway pressure:      Minute ventilation:

## 2022-06-04 NOTE — Other (Addendum)
06/04/22 3664   Treatment   $CRRT $Yes   Machine #   [id number])   Cartridge Lot #   [id number])   Therapy Type CVVHD   Pressures   Access (mmHg) -88 mmHg   Return/Venous (mmHg) 93 mmHg   Effluent (mmHg) 138 mmHg   Filter (mmHg) 210 mmHg   TMP Pressure (mmHg) 8 mmHg   Pressure Drop (mmHg) 67 mmHg   Deaeration Chamber Check Yes   Flow Rates   Blood Flow (mL/min) 250 mL/min   Dialysate (mL/hr) 2550 mL/hr   CRRT Activities   Temporary Disconnect Other  (New cartridge, clot in deaeration chamber)   Intervention Other  (Davita RN at bedside to change filter)   Ultrafiltrate Assessment   Ultrafiltrate Color Yellow/straw   Ultrafiltrate Appearance Clear   Hemodialysis Central Access Right Femoral vein   Placement Date/Time: 06/01/22 1830   Present on Admission/Arrival: No  Inserted by: Dr. Christel Mormon  Insertion Practices: Chlorohexadine skin antisepsis;Maximal barrier precautions;Sterile ultrasound technique;Optimal catheter site selection;Betadine...   Continued need for line? Yes   Site Assessment Clean, dry & intact   Venous Lumen Status Infusing   Arterial Lumen Status Infusing   Line Care Connections checked and tightened;Chlorhexidine wipes;Line pulled back;Ports disinfected;Tubing changed   Dressing Type Bacteriocidal;Transparent   Date of Last Dressing Change 06/04/22   Dressing Status New dressing applied   Dressing Intervention Dressing changed   Dressing Change Due 06/05/22     Upon rounding large visible clotting in deaeration chamber noted, filter changed as a result.  All possible blood returned. R femoral dressing changed as per policy, CDI, no s/sx infection.   HF1000 primed and tested. Lines are visible and secure with thermax set at 37*C.   Primary RN SBAR: Merry Proud, RN  Patient Education: Unable/intubated  Reviewed with primary RNS  Hepatitis B Surface Ag   Date/Time Value Ref Range Status   06/01/2022 10:08 PM <0.10 Index Final     Hep B S Ab   Date/Time Value Ref Range Status   06/01/2022 10:08 PM  <3.10 mIU/mL Final        06/04/22 0700   Treatment   Time On 0700   Observations & Evaluations   Level of Consciousness 3   Respiratory Quality/Effort   (assisted)   O2 Device Ventilator   Skin Color Pale   Skin Condition/Temp Cool;Dry   Edema Generalized   Edema Generalized +1   RUE Edema +1   LUE Edema +1   RLE Edema +1   LLE Edema +1   Technical Checks   ICEBOAT I;C;E;B;O;A;T

## 2022-06-04 NOTE — Progress Notes (Signed)
1900: Bedside shift change report given to [facility] and Efraim Kaufmann (Cabin crew) by Wyn Quaker (offgoing nurse). Report included the following information Nurse Handoff Report, Adult Overview, Intake/Output, MAR, Recent Results, and Cardiac Rhythm Sinus Arrhythmia .      2000: Assessment completed. Patient intubated and sedated. Does not follow commands. Does not withdraw from pain in the extremities. Bites ETT and toothette when completing oral care. Weak cough and gag. R pupil > L pupil. Sclera jaundiced and edematous. Lung sounds clear and diminished. Cardiac rhythm: Sinus arrhythmia. BUE pulses weak. Pedal pulse on R foot palpable, weak. Doppler used for L foot. RLE pulse > LLE pulse. Generalized +1 non-pitting edema. Trace edema in BLE. Bowel sounds: hypoactive left, absent right. Midline incision and 2 JP drains present on R side. Patient is anuric. Patient has a IJ CVC, R fem quinton, PIVs, ETT, NGT, and arterial line.     2015: Dr. Marco Collie (gen surgery) contacted with CT results. [place], NP present during call.    2050: IR consult called.     2140: per Gerarda Gunther, NP, IR will place drain tomorrow(06/05/2022), call placed to [facility] RN(Davita) to restart CVVH

## 2022-06-04 NOTE — Other (Signed)
[place]  [facility]  DIABETES MANAGEMENT CONSULT    Consulted by Provider for advanced nursing evaluation and care for inpatient blood glucose management.    Evaluation and Action Plan   This 76 year old Caucasian male was admitted with RUQ pain and underwent laparotomy 06/01/22. Septic shock with multi-organ failure. Remains on vent support, pressors & CRRT. There is concern for LLE due to diminished pedal pulse. As for BG management in this patient with known Type 2 diabetes, began low dose basal insulin 06/02/22 along with corrective insulin to address impact of steroid. Bgs had been 170-180s but are creeping up to 190-200s.     Management Rationale Action Plan   Medication   Overriding steroid effect Based on BG pattern Increase NPH insulin to 6 units twice daily   Additional orders   Trickle feeds will begin at some time today          Initial Presentation   Jaime [name] is a 76 y.o. male admitted 05/31/22 from ER after experiencing RUQ pain associated with generalized weakness, nausea & vomiting for several days PTA. AMS+. Hypotension+. Fever+. Dyspneic+. He is visiting from [place].  Ectopic atrial tachycardia  ER exam:  Cardiovascular:      Rate and Rhythm: Regular rhythm. Tachycardia present.   Pulmonary:      Comments: He is tachypneic, coarse breath sounds on inspiration at bilateral bases  Abdominal:      Palpations: Abdomen is soft.      Comments: Tender to palpation diffusely in the right sided abdomen without guarding or rebound tenderness   LAB: WBC 16.6. Normal H&H. Low platelets.BG 272/AG 17. AKI. Elevated liver enzymes. NT pro-BNP 20,980. Lipase >3000.     CXR:   Right IJ catheter satisfactory position without pneumothorax. ET   tube and NG tube as above   CT Head: Negative  CT ABd:   Extraluminal air bubbles in the right upper quadrant concerning for gastric   perforation. Inflammatory changes are noted about the tail of the pancreas   extending into the left anterior  pararenal space, please correlate with any   evidence of acute pancreatitis. Bilateral lower lobe infiltrates.     HX:No past medical history on file.     INITIAL DX: Septicemia (HCC) [A41.9]  Gastric perforation (HCC) [K25.5]  Perforated abdominal viscus [R19.8]     Current Treatment     TX: 06/01/22 LAPAROTOMY EXPLORATORY  LAPAROSCOPY DIAGNOSTIC  ENDOSCOPY OF ILEAL CONDUIT  EGD    Hospital Course   Clinical progress has been complicated by multi-organ failure.   06/01/22 Dialysis  06/02/22 In septic shock with multi-organ failure. On vent support, sedation, pressor support and bicarb infusion. On Abx. Two runs of VT this morning requiring defibrillation; cardiology conversation anticipated. CRRT+  06/03/22 Remains on vent support C vent Fi02 50%/Peep 7. Low grade fever; on Abx. Steroids+. Afib/bradycardic. Pressors+. CRRT+.   06/04/22 Remains on vent support and pressors. On Abx. Concern for LLE. CRRT.     Diabetes History   Type 2 diabetes on metformin therapy    Diabetes-related Medical History deferred    Diabetes Medication History - based on PTA list  Drug class Currently in use   Biguanide [x]  Metformin (Glucophage)  []  Metformin ER (Glucophage XL)     Diabetes self-management practices: deferred  Overall evaluation:    [x]  Unknown A1c     Subjective   NA     Objective   Physical exam  General Obese  male who is ill-appearing  Neuro  Sedated  Vital Signs   Vitals:    06/04/22 1000   BP: (!) 129/52   Pulse: 57   Resp: 20   Temp:    SpO2: 96%     Extremities No foot wounds    Diabetic foot exam:    Left Foot     Visual Exam: Cool & dry. Thickened toenails    Pulse DP: +1   Right Foot   Visual Exam: Cool & dry. Thickened toenails    Pulse DP: +1      Laboratory  Recent Labs     06/02/22  0405 06/03/22  0403 06/04/22  0307   WBC 26.8* 29.3* 31.3*   HGB 13.0 12.2 11.0*   HCT 39.6 36.4* 32.7*   MCV 91.9 89.0 88.9   PLT 32* 27* 30*       Recent Labs     06/03/22  0403 06/03/22  1522 06/04/22  0307   NA 136 137 135*   K 4.2  4.3 3.9   CL 105 106 105   CO2 21 21 21    PHOS 3.8 4.1 3.6   BUN 48* 49* 48*   CREATININE 2.73* 2.52* 2.23*       Lab Results   Component Value Date    ALT 1,519 (H) 06/03/2022    AST 1,419 (H) 06/03/2022    ALKPHOS 440 (H) 06/03/2022    BILITOT 8.3 (H) 06/03/2022     No results found for: TSH, TSHREFLEX, TSHFT4, TSHELE, TSH3GEN, TSHHS   Lab Results   Component Value Date    LABA1C 7.7 (H) 06/02/2022     Factors impacting BG management  Factor Dose Comments   Nutrition:  NPO       Drugs:  Vasopressor load  Steroids   Levo/Vasopressin infusions  HC 3XD  Florinef D   Affects insulin delivery  Impairs insulin action   Pain Fent & Versed infusions    Infection Eraxis Q24 hrs  Rocephin Q24 hrs WBCs rising   Other:   Kidney function  Liver function   AKI  Liver enzymes markedly elevated      Blood glucose pattern      Significant diabetes-related events over the past 24-72 hours  05/31/22 Admission BG 233  06/01/22 Dex 4mg  => BG 100s => HC started  06/02/22 Bgs into 200s  06/03/22 Bgs in 170-180s  06/04/22 Bgs rising abit. HC reduced frequency today. Trickle feeds to begin    Assessment and Nursing Intervention   Nursing Diagnosis Risk for unstable blood glucose pattern   Nursing Intervention Domain 5250 Decision-making Support   Nursing Interventions Examined current inpatient diabetes/blood glucose control   Explored factors facilitating and impeding inpatient management  Explored corrective strategies with patient and responsible inpatient provider   Informed patient of rational for insulin strategy while hospitalized     Billing Code(s)   []  99233 IP subsequent hospital care - 50 minutes   [x]  99232 IP subsequent hospital care - 35 minutes   []  08/03/22 IP subsequent hospital care - 25 minutes   []  99221 IP initial hospital care - 40 minutes     Before making these care recommendations, I personally reviewed the hospitalization record, including notes, laboratory & diagnostic data and current medications, and examined the  patient at the bedside (circumstances permitting) before determining care. More than fifty (50) percent of the time was spent in patient counseling and/or care coordination.  Total minutes: 35  Guido Sander, APRN - CNS  Clinical Nurse Specialist - Diabetes & endocrine disorders  [facility]  Access via Perfect [employer]

## 2022-06-04 NOTE — Progress Notes (Signed)
NAME: Jaime [name]        DOB:  1946-06-01        MRN:  [id number]                     Assessment   :                                               Plan:  AKI  Sepsis  Hypotension  HTN  DM       AKI related to sepsis and shock. CT showed renal cysts.  He has poor UO.      CVVHD started 7/31.      Continue with factor 0.  2K bags.     Continue pressors for BP support.     Patient is critically ill and at significant risk of deterioration.        DW RN.            Subjective:     Chief Complaint:  intubated.    Review of Systems:    Symptom Y/N Comments  Symptom Y/N Comments   Fever/Chills    Chest Pain     Poor Appetite    Edema     Cough    Abdominal Pain     Sputum    Joint Pain     SOB/DOE    Pruritis/Rash     Nausea/vomit    Tolerating PT/OT     Diarrhea    Tolerating Diet     Constipation    Other       Could not obtain due to:      Objective:     VITALS:   Last 24hrs VS reviewed since prior progress note. Most recent are:  Vitals:    06/04/22 0900   BP: 124/65   Pulse: 60   Resp: 20   Temp:    SpO2: 96%       Intake/Output Summary (Last 24 hours) at 06/04/2022 4098  Last data filed at 06/04/2022 0900  Gross per 24 hour   Intake 1853.79 ml   Output 2011 ml   Net -157.21 ml        Telemetry Reviewed:     PHYSICAL EXAM:  General: Intubated  +edema      Lab Data Reviewed: (see below)    Medications Reviewed: (see below)    PMH/SH reviewed - no change compared to H&P  ________________________________________________________________________  Care Plan discussed with:  Patient     Family      RN     Care Manager                    Consultant:          Comments   >50% of visit spent in counseling and coordination of care       ________________________________________________________________________  Marina Gravel, MD     Procedures: see electronic medical records for all procedures/Xrays and details which  were not copied into this note but  were reviewed prior to creation of Plan.      LABS:  Recent Labs     06/03/22  0403 06/04/22  0307   WBC 29.3* 31.3*   HGB 12.2 11.0*   HCT 36.4* 32.7*   PLT 27* 30*       Recent Labs  06/03/22  0403 06/03/22  1522 06/04/22  0307   NA 136 137 135*   K 4.2 4.3 3.9   CL 105 106 105   CO2 21 21 21    BUN 48* 49* 48*   MG 2.1 2.0 2.2   PHOS 3.8 4.1 3.6       Recent Labs     06/02/22  0405 06/03/22  0403   GLOB 3.3 3.5       Recent Labs     06/02/22  0405   INR 2.0*        No results for input(s): TIBC, FERR in the last 72 hours.    Invalid input(s): FE, PSAT   No results found for: FOL, RBCF   No results for input(s): PH, PCO2, PO2 in the last 72 hours.  No results for input(s): CPK, CKMB, TROPONINI in the last 72 hours.  No components found for: [facility]  @labua @    MEDICATIONS:  Current Facility-Administered Medications   Medication Dose Route Frequency    balsum peru-castor oil (VENELEX) ointment   Topical BID    norepinephrine (LEVOPHED) 16 mg in sodium chloride 0.9 % 250 mL infusion  1-100 mcg/min IntraVENous Continuous    sodium chloride 0.9 % bolus 500 mL  500 mL IntraVENous Once    metronidazole (FLAGYL) 500 mg in 0.9% NaCl 100 mL IVPB premix  500 mg IntraVENous Q8H    cefTRIAXone (ROCEPHIN) 2,000 mg in sodium chloride 0.9 % 50 mL IVPB (mini-bag)  2,000 mg IntraVENous Q24H    amiodarone (CORDARONE) 450 mg in dextrose 5 % 250 mL infusion (Vial2Bag)  0.5 mg/min IntraVENous Continuous    midazolam (VERSED) 100mg /15mL in NS infusion  1-10 mg/hr IntraVENous Continuous    fludrocortisone (FLORINEF) tablet 0.1 mg  0.1 mg Oral Daily    glucose chewable tablet 16 g  4 tablet Oral PRN    dextrose bolus 10% 125 mL  125 mL IntraVENous PRN    Or    dextrose bolus 10% 250 mL  250 mL IntraVENous PRN    glucagon (rDNA) injection 1 mg  1 mg SubCUTAneous PRN    dextrose 10 % infusion   IntraVENous Continuous PRN    insulin NPH (HumuLIN N;NovoLIN N) injection vial 5 Units  5 Units SubCUTAneous 2 times per day    insulin lispro  (HUMALOG) injection vial 0-4 Units  0-4 Units SubCUTAneous 4 times per day    insulin lispro (HUMALOG) injection vial 0-4 Units  0-4 Units SubCUTAneous Nightly    vasopressin 20 Units in sodium chloride 0.9 % 100 mL infusion  0.03 Units/min IntraVENous Continuous    sodium chloride flush 0.9 % injection 5-40 mL  5-40 mL IntraVENous 2 times per day    sodium chloride flush 0.9 % injection 5-40 mL  5-40 mL IntraVENous PRN    0.9 % sodium chloride infusion   IntraVENous PRN    acetaminophen (TYLENOL) tablet 650 mg  650 mg Oral Q6H PRN    Or    acetaminophen (TYLENOL) suppository 650 mg  650 mg Rectal Q6H PRN    ondansetron (ZOFRAN) injection 4 mg  4 mg IntraVENous Q6H PRN    chlorhexidine (PERIDEX) 0.12 % solution 15 mL  15 mL Mouth/Throat BID    pantoprazole (PROTONIX) 40 mg in sodium chloride (PF) 0.9 % 10 mL injection  40 mg IntraVENous Q12H    hydrocortisone sodium succinate PF (SOLU-CORTEF) injection 50 mg  50 mg IntraVENous Q6H    anidulafungin (ERAXIS)  100 mg in sodium chloride 0.9 % 130 mL IVPB  100 mg IntraVENous Q24H    fentanyl (SUBLIMAZE) infusion 1000 mcg/184mL  25-200 mcg/hr IntraVENous Continuous    phenylephrine (NEO-SYNEPHRINE) 50 mg in sodium chloride 0.9 % 250 mL infusion (Vial2Bag)  10-300 mcg/min IntraVENous Continuous    prismaSol BGK 2/3.5 dialysis solution   Dialysis Continuous

## 2022-06-04 NOTE — Progress Notes (Addendum)
0700- Bedside shift change report given to [employer] Control and instrumentation engineer) by Romie Minus and [employer] (offgoing nurse). Report included the following information Nurse Handoff Report, Index, Adult Overview, Surgery Report, Intake/Output, MAR, and Recent Results.     0830- NGT found to be partially coiled in the mouth, but still suctioning up gastric contents into cannister. Coiled pushed back into throat and KUB ordered to confirm placement.     6269- Dr. Rutherford Limerick rounding at the bedside. Orders to titrate pressor off art line pressure, goal is now SBP greater than 100, 500 mL NS bolus, and vascular surgery consult. Versed gtt turned off.     4854- Vascular Surgery consult called to [facility].     1000- IDRs conducted at bedside.     1200- General surgery rounding on pt. Order for labs on fluid from JP drain #2 sent. Order to remove SCDs completed.     1450- Discussed with Dr. Rutherford Limerick, no tube feeds at this time; he would also like pt to go for CT scan once CRRT filter has clotted off. RN to notify MD when this occurs.     1710- Pt CRRT rinsed back and blood returned. Pt going to CT. ETT advanced to 26 at the lip by RT, per Dr. Debbora Presto order. Chest xray confirmed proper placement.     1900- Bedside shift change report given to Melissa (Cabin crew) by Wyn Quaker (offgoing nurse). Report included the following information Nurse Handoff Report, Index, Adult Overview, Surgery Report, Intake/Output, MAR, and Recent Results.     1930- General surgery office paged regarding abdominal CT results. Awaiting call back.

## 2022-06-04 NOTE — Progress Notes (Signed)
Interdisciplinary rounds with Critical Care team including Dr. Rutherford Limerick, pharmacy, nutrition, care management, diabetes treatment, nursing, and clinical coordinator.   Goals of care discussed. Continues on vent, CRRT. Trickle feeds to start per nutrition.

## 2022-06-04 NOTE — Progress Notes (Signed)
SURGERY PROGRESS NOTE      Admit Date: 05/31/2022    POD 3 Days Post-Op    Procedure: Procedure(s):  LAPAROTOMY EXPLORATORY  LAPAROSCOPY DIAGNOSTIC  ENDOSCOPY OF ILEAL CONDUIT      Subjective:     Intubated, sedated.      Objective:     BP (!) 98/47   Pulse 59   Temp 98.9 F (37.2 C) (Oral)   Resp 20   Ht 1.727 m (5\' 8" )   Wt 103.7 kg (228 lb 9.9 oz)   SpO2 91%   BMI 34.76 kg/m      Temp (24hrs), Avg:98.7 F (37.1 C), Min:97.3 F (36.3 C), Max:99.3 F (37.4 C)      08/03 0701 - 08/03 1900  In: 1932.2 [I.V.:303.1]  Out: 1309 [Drains:50]  08/01 1901 - 08/03 0700  In: 3483.8 [I.V.:2891.7]  Out: 3451 [Drains:460]    Physical Exam:    General:  Intubated and sedated    Abdomen: Soft, tender, hypoactive BS, JP 1 serosanguineous, JP 2 murky brown   Incision:   Dressing clean,Dry, intact          CBC:   Lab Results   Component Value Date/Time    WBC 31.3 06/04/2022 03:07 AM    RBC 3.68 06/04/2022 03:07 AM    HGB 11.0 06/04/2022 03:07 AM    HCT 32.7 06/04/2022 03:07 AM    MCV 88.9 06/04/2022 03:07 AM    MCH 29.9 06/04/2022 03:07 AM    MCHC 33.6 06/04/2022 03:07 AM    RDW 14.7 06/04/2022 03:07 AM    PLT 30 06/04/2022 03:07 AM    MPV ABNORMAL 06/04/2022 03:07 AM     BMP:    Lab Results   Component Value Date/Time    NA 135 06/04/2022 03:07 AM    K 3.9 06/04/2022 03:07 AM    CL 105 06/04/2022 03:07 AM    CO2 21 06/04/2022 03:07 AM    BUN 48 06/04/2022 03:07 AM    LABALBU 2.1 06/03/2022 04:03 AM    CREATININE 2.23 06/04/2022 03:07 AM    CALCIUM 8.7 06/04/2022 03:07 AM    LABGLOM 30 06/04/2022 03:07 AM    GLUCOSE 198 06/04/2022 03:07 AM       Assessment:     Principal Problem:    Gastric perforation (HCC)  Active Problems:    Type 2 diabetes mellitus with hyperglycemia, without long-term current use of insulin (HCC)  Resolved Problems:    * No resolved hospital problems. *    Overall doing poorly and in multiorgan failure.  Shock liver, anuric AKI, encephalopathic, remains on 2 pressors although marginally improving.   Worsening leukocytosis now over 30 K, source remains unclear.  E. coli bacteremia.    Plan:     Continue supportive care  Check drain amylase  Low threshold to rescan when clinically stable for transport

## 2022-06-04 NOTE — Other (Signed)
CRRT / [phone]    Primary RN SBAR: M.Blackwell, RN  Patient Education: unable to educate - pt intubated/sedated - no family at bedside    Hepatitis B Surface Ag   Date/Time Value Ref Range Status   06/01/2022 10:08 PM <0.10 Index Final     Hep B S Ab   Date/Time Value Ref Range Status   06/01/2022 10:08 PM <3.10 mIU/mL Final       06/04/22 2215   Treatment   $CRRT $Yes   Machine #   [id number])   Cartridge Lot #   [phone] [phone])   Therapy Type CVVHD   Pressures   Access (mmHg) -77 mmHg   Return/Venous (mmHg) 95 mmHg   Filter (mmHg) 203 mmHg   TMP Pressure (mmHg) 0 mmHg   Pressure Drop (mmHg) 58 mmHg   Deaeration Chamber Check Yes   Flow Rates   Therapy Fluid (L/hr) 2550 L/hr   Blood Flow (mL/min) 250 mL/min   Dialysate (mL/hr) 2550 mL/hr   System Used   System Used [employer] Calculation   (D) Physician Ordered Hourly Removal (mL) 0 ml   CRRT Activities   Intervention Initiated   Hemodialysis Central Access Right Femoral vein   Placement Date/Time: 06/01/22 1830   Present on Admission/Arrival: No  Inserted by: Dr. Christel Mormon  Insertion Practices: Chlorohexadine skin antisepsis;Maximal barrier precautions;Sterile ultrasound technique;Optimal catheter site selection;Betadine...   Continued need for line? Yes   Site Assessment Clean, dry & intact   Venous Lumen Status Infusing   Arterial Lumen Status Infusing   Line Care Connections checked and tightened;Ports disinfected   Dressing Type Bacteriocidal;Transparent   Date of Last Dressing Change 06/05/22   Dressing Status Clean, dry & intact   Dressing Change Due 06/05/22     Davita RN at bedside to restart CRRT d/t pt s/p CT. Patient, code status, labs, and orders verified. Consents on chart. Time out completed. Primary RN able to previously return all pt blood from circuit (186 mls.) Old set discarded in red biohazard bin. HF-1000 filter set-up, primed w/ 1L NS, tested and running well. Lines visible and connections secure with Thermax blood warmer to return line  at 37*C. Education, pre and post to primary RN.  Primary RN states pt will go for ordered procedure tomorrow.    Primary RN SBAR: M.Blackwell, RN    HR: 61  BP: 103/39

## 2022-06-04 NOTE — Consults (Cosign Needed)
Vascular Surgery Consult Note  06/04/2022    Subjective:     Jaime [name] is an obese 76 y.o. male with a pmhx significant for DM, HTN, HLD, HFpEF, AS, and CKD.  He is a non-smoker. He is admitted to the hospital with septic shock secondary to a suspected perforated viscus w/ enterobacteriaceae and ecoli bacteremia.  He is s/p emergent exploratory laparotomy 06/01/22.  His course is complicated by multiorgan failure.  He is currently intubated, sedated, and on CRRT with vasopressor support.  He has had multiple runs of VT and is s/p electric CV x 2.  He is currently on an amiodarone drip.  He is thrombocytopenic.  We have been asked to evaluate for diminished left lower extremity pulses.      Past medical history   Diabetes mellitus   Diabetic neuropathy   Obesity   Hypertension  Mixed dyslipidemia    Chronic grade 1 diastolic congestive heart failure -Dr. Sherlean Foot [phone])  Moderate aortic stenosis   Chronic renal disease stage IIIb -Dr. Ronalee Belts at [facility]  -baseline creatinine 1.7  Renal calculi    Past procedural history  Emergent exploratory laparotomy 06/01/22  Cystoscopy with retrograde pyelogram, ureteroscopy and stent placement 06/2015 -Dr. Bjorn Pippin  Removal of bilateral cataracts 2020  Tonsillectomy     Social history  He is a non-smoker.   He does not have a history of alochol use   He is married. 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 [facility].      Family history  Cancer: mother and father     Home medications   atorvastatin (LIPITOR) 10 MG tablet Take 1 tablet (10 mg total) by mouth daily.   cholecalciferol (VITAMIN D) 1000 units tablet Take 2,000 Units by mouth daily.   fish oil-omega-3 fatty acids 1000 MG capsule Take 1 g by mouth 2 (two) times daily after a meal.   glimepiride (AMARYL) 4 MG tablet take one tablet oral daly   hydrALAZINE (APRESOLINE) 25 MG tablet Take 1 tablet (25 mg total) by mouth 3 (three) times daily.    hydrochlorothiazide (HYDRODIURIL) 25 MG tablet Take 1 tablet (25 mg total) by mouth daily.   losartan (COZAAR) 100 MG tablet Take 1 tablet (100 mg total) by mouth daily.   metoprolol succinate (TOPROL-XL) 100 MG 24 hr tablet Take 1 tablet (100 mg total) by mouth daily. Take with or immediately following a meal.   Multiple Vitamins-Minerals (MULTIVITAMIN WITH MINERALS) tablet Take 1 tablet by mouth daily.   vitamin B-12 (CYANOCOBALAMIN) 100 MCG tablet Take 100 mcg by mouth every morning.      Allergies and intolerances  Augmentin  Codeine     Review of Systems   Unable to perform ROS: Intubated     Objective:     Patient Vitals for the past 24 hrs:   BP Temp Temp src Pulse Resp SpO2 Weight   06/04/22 0900 124/65 -- -- 60 20 96 % --   06/04/22 0800 (!) 117/55 99.1 F (37.3 C) Oral 60 20 97 % --   06/04/22 0600 (!) 100/51 98.7 F (37.1 C) Axillary 61 20 97 % --   06/04/22 0500 (!) 109/52 99.3 F (37.4 C) Axillary 61 20 93 % --   06/04/22 0410 -- -- -- 62 20 94 % --   06/04/22 0400 (!) 114/53 99 F (37.2 C) Axillary 62 20 92 % --   06/04/22 0300 Marland Kitchen)  114/57 98 F (36.7 C) Axillary 60 20 95 % --   06/04/22 0200 (!) 98/48 98.9 F (37.2 C) Axillary 59 20 96 % --   06/04/22 0100 (!) 111/59 99.1 F (37.3 C) Axillary 62 20 93 % 103.7 kg (228 lb 9.9 oz)   06/04/22 0000 (!) 104/54 99.2 F (37.3 C) Axillary 64 20 92 % --   06/03/22 2348 -- -- -- 64 20 93 % --   06/03/22 2300 (!) 130/51 98.1 F (36.7 C) Axillary 59 20 94 % --   06/03/22 2200 (!) 121/53 98.7 F (37.1 C) Axillary 64 20 94 % --   06/03/22 2100 (!) 139/54 97.3 F (36.3 C) Axillary 63 20 95 % --   06/03/22 2000 (!) 117/50 98.5 F (36.9 C) Axillary 62 20 96 % --   06/03/22 1931 -- -- -- 61 20 96 % --   06/03/22 1900 (!) 120/45 -- -- 61 20 96 % --   06/03/22 1800 (!) 113/58 -- -- (!) 104 20 96 % --   06/03/22 1700 136/76 -- -- (!) 114 20 95 % --   06/03/22 1603 129/66 -- -- 96 20 96 % --   06/03/22 1600 129/66 99.1 F (37.3 C) Axillary 94 20 95 % --    06/03/22 1520 (!) 108/50 -- -- 95 20 96 % --   06/03/22 1513 (!) 105/57 -- -- 96 20 95 % --   06/03/22 1507 115/73 -- -- 97 20 -- --   06/03/22 1500 (!) 173/150 -- -- 98 20 97 % --   06/03/22 1400 (!) 181/94 -- -- (!) 102 20 96 % --   06/03/22 1300 (!) 147/90 -- -- (!) 107 20 93 % --   06/03/22 1217 -- -- -- (!) 101 20 94 % --   06/03/22 1200 -- 99.5 F (37.5 C) Axillary 100 20 94 % --   06/03/22 1100 -- -- -- 66 22 96 % --   06/03/22 1042 -- -- -- 61 20 96 % --   06/03/22 1000 -- -- -- 61 20 96 % --     Physical Exam  Constitutional:       Interventions: He is intubated.      Comments: Acutely ill appearing male    Eyes:      Pupils: Pupils are equal, round, and reactive to light.   Cardiovascular:      Rate and Rhythm: Normal rate. Rhythm irregular.      Comments: Feet are cold without palpable pulses bilaterally.  Rubor of the toes and plantar surfaces bilaterally.  Cyanosis of the leg great toe.   Pulmonary:      Effort: No tachypnea, accessory muscle usage or respiratory distress. He is intubated.   Abdominal:      General: There is no distension.      Palpations: Abdomen is soft.      Comments: Abdominal incisions with dressing intact   Skin:     Coloration: Skin is pale.     Pertinent Test Results:   Recent Results (from the past 24 hour(s))   Blood Gas, Arterial    Collection Time: 06/03/22 11:08 AM   Result Value Ref Range    pH, Arterial 7.38 7.35 - 7.45      pCO2, Arterial 37 35 - 45 mmHg    pO2, Arterial 77 (L) 80 - 100 mmHg    POC O2 SAT 95 92 - 97 %    HCO3, Arterial 22  22 - 26 mmol/L    Base deficit, arterial blood 2.9 mmol/L    O2 Method VENT      FIO2 Arterial 50 %    Mode ASSIST CONTROL      POC TIDAL VOLUME 500.0      Set Rate, POC 20      POC PEEP/CPA 7.0      Source ARTERIAL      Site DRAWN FROM ARTERIAL LINE      Allen Test NOT APPLICABLE     POCT Glucose    Collection Time: 06/03/22 12:31 PM   Result Value Ref Range    POC Glucose 175 (H) 65 - 117 mg/dL    Performed by: Illene Labrador     Magnesium    Collection Time: 06/03/22  3:22 PM   Result Value Ref Range    Magnesium 2.0 1.6 - 2.4 mg/dL   Phosphorus    Collection Time: 06/03/22  3:22 PM   Result Value Ref Range    Phosphorus 4.1 2.6 - 4.7 MG/DL   Basic Metabolic Panel    Collection Time: 06/03/22  3:22 PM   Result Value Ref Range    Sodium 137 136 - 145 mmol/L    Potassium 4.3 3.5 - 5.1 mmol/L    Chloride 106 97 - 108 mmol/L    CO2 21 21 - 32 mmol/L    Anion Gap 10 5 - 15 mmol/L    Glucose 192 (H) 65 - 100 mg/dL    BUN 49 (H) 6 - 20 MG/DL    Creatinine 3.87 (H) 0.70 - 1.30 MG/DL    Bun/Cre Ratio 19 12 - 20      Est, Glom Filt Rate 26 (L) >60 ml/min/1.18m2    Calcium 8.3 (L) 8.5 - 10.1 MG/DL   Lactic Acid    Collection Time: 06/03/22  3:22 PM   Result Value Ref Range    Lactic Acid, Plasma 2.4 (HH) 0.4 - 2.0 MMOL/L   POCT Glucose    Collection Time: 06/03/22  5:16 PM   Result Value Ref Range    POC Glucose 170 (H) 65 - 117 mg/dL    Performed by: Illene Labrador    POCT Glucose    Collection Time: 06/03/22  8:37 PM   Result Value Ref Range    POC Glucose 195 (H) 65 - 100 mg/dL    Performed by: Danae Chen RN    POCT Glucose    Collection Time: 06/04/22 12:24 AM   Result Value Ref Range    POC Glucose 195 (H) 65 - 100 mg/dL    Performed by: Lajuana Carry RN    Lactic Acid    Collection Time: 06/04/22 12:40 AM   Result Value Ref Range    Lactic Acid, Plasma 2.0 0.4 - 2.0 MMOL/L   Magnesium    Collection Time: 06/04/22  3:07 AM   Result Value Ref Range    Magnesium 2.2 1.6 - 2.4 mg/dL   Phosphorus    Collection Time: 06/04/22  3:07 AM   Result Value Ref Range    Phosphorus 3.6 2.6 - 4.7 MG/DL   Basic Metabolic Panel    Collection Time: 06/04/22  3:07 AM   Result Value Ref Range    Sodium 135 (L) 136 - 145 mmol/L    Potassium 3.9 3.5 - 5.1 mmol/L    Chloride 105 97 - 108 mmol/L    CO2 21 21 - 32 mmol/L    Anion Gap 9  5 - 15 mmol/L    Glucose 198 (H) 65 - 100 mg/dL    BUN 48 (H) 6 - 20 MG/DL    Creatinine 9.60 (H) 0.70 - 1.30 MG/DL    Bun/Cre  Ratio 22 (H) 12 - 20      Est, Glom Filt Rate 30 (L) >60 ml/min/1.68m2    Calcium 8.7 8.5 - 10.1 MG/DL   CBC    Collection Time: 06/04/22  3:07 AM   Result Value Ref Range    WBC 31.3 (H) 4.1 - 11.1 K/uL    RBC 3.68 (L) 4.10 - 5.70 M/uL    Hemoglobin 11.0 (L) 12.1 - 17.0 g/dL    Hematocrit 45.4 (L) 36.6 - 50.3 %    MCV 88.9 80.0 - 99.0 FL    MCH 29.9 26.0 - 34.0 PG    MCHC 33.6 30.0 - 36.5 g/dL    RDW 09.8 (H) 11.9 - 14.5 %    Platelets 30 (LL) 150 - 400 K/uL    MPV ABNORMAL 8.9 - 12.9 FL    Nucleated RBCs 0.4 (H) 0 PER 100 WBC    nRBC 0.14 (H) 0.00 - 0.01 K/uL   POCT Glucose    Collection Time: 06/04/22  5:31 AM   Result Value Ref Range    POC Glucose 208 (H) 65 - 100 mg/dL    Performed by: Lajuana Carry RN        Assessmen/Plan:     Diminished pulses of the lower extremity  Diabetic neuropathy   Decreased peripheral perfusion of the bilateral lower extremities due to shock.  The patient remains on Levophed and Vasopressin.  No role for further evaluation or procedural intervention at this time.  Patient is critically ill with hypotension and thrombocytopenia.  He would not be a candidate for endovascular or surgical revascularization at this time.  Once he has been weaned from vasopressor support  ABIs can be obtained.  Patient remains at risk for progressive deterioration and limb loss.  We will continue to follow. Discontinue SCDs contraindicated with diffuse poor peripheral perfusion.      Septic shock  -remains on vasopressor support  -progressive leukocytosis  Enterobacteriaceae and Ecoli septicemia   Lactic acidosis     Perforated Viscus  -managed by Dr. Lenard Forth    Acute hypoxic respiratory failure  -intubated 7/31     Subendocardial ischemia  -Troponin peaked at 173  -Likely demand ischemia  Ventricular tachycardia  -electric CV x 2   Persistent atrial fibrillation  -amio drip   Chronic grade 1 diastolic congestive heart failure  Moderate aortic stenosis  History of hypertension  Mixed hyperlipidemia    -triglycerides 1118  Cardiology following    Acute kidney injury  Oliguria  -on CRRT  Chronic renal disease stage 3B  -baseline creatinine 1.7  Elevated CK  Nephrology following    Shock liver  Hyperbilirubinemia   Thrombocytopenia   -PLT count 30  Coagulopathy   -INR 2.0  Hypoalbuminemia     Acute encephalopathy     Diabetes mellitus w/ mild hyperglycemia   Obesity     Management of comorbid conditions by CC team.    VTE Prophylaxis:  N/A PLT count 30  SCDs contraindicated in diffuse poor perfusion    Disposition:  TBD-patient remains critically ill    Signed By: Liz Beach ACNP-BC    June 04, 2022

## 2022-06-04 NOTE — Progress Notes (Signed)
Comprehensive Nutrition Assessment    Type and Reason for Visit:  Initial, Consult    Nutrition Recommendations/Plan:   Initiate TF via NGT:  Start TwoCal HN @ 64mL/h + 72mL flush q 4h   As vasopressor needs come down, would advance 48mL q 12h as tolerated to Goal of 2mL/h + Prosource BID (provides 2086kcals/120gPro/209gCHO/949mL)   Stop TF if any signs/sx of NOMI develop (increasing abdominal pain, distension or lactic acid levels)      Malnutrition Assessment:  Malnutrition Status:  Insufficient data (06/04/22 1319)        Nutrition Assessment:  Pt admitted with presumed gastric perforation.  PMH: CVA, HTN, DM, dysphagia.  Chart reviewed, case discussed during CCU rounds.  Pt is s/p ex lap on 7/31, no perforation found.  He is intubated.  No family in the room to speak with.  Pt appears well muscled, no fat wasting noted per limited NFPE.  He is on CVVHD.  He has been on too much pressor support the past few days to consider TF initiation.  Today levo is being weaned down, on 6 currently and vaso at 0.03.  MAP's are in the 70's at the moment.  Per IDR discussion okay to start trophic feeds as above.  K 3.9 and phos 3.6.  BG 195-208.  No BM noted yet.  Would monitor closely for NOMI.  MST not filled out, unable to fully r/o malnutrition at this time.        Nutrition Related Findings:    Meds: eraxis, rocephin, florinef, solucortef, thiamine, humalog, insulin NPH, protonix, flagyl; Drips: levo, vaso.  Edema: +1-generalized, BUE, trace-BLE.  BM: PTA Wound Type: Surgical Incision       Current Nutrition Intake & Therapies:    Average Meal Intake: NPO         Anthropometric Measures:  Height: 172.7 cm (5\' 8" )  Ideal Body Weight (IBW): 154 lbs (70 kg)       Current Body Weight: 228 lb 9.9 oz (103.7 kg), 148.5 % IBW. Weight Source: Bed Scale  Current BMI (kg/m2): 34.8                          BMI Categories: Obese Class 1 (BMI 30.0-34.9)    Estimated Daily Nutrient Needs:  Energy Requirements Based On: Formula  Weight  Used for Energy Requirements: Current  Energy (kcal/day): PSU 2008 (MSJ 1747)  Weight Used for Protein Requirements: Ideal  Protein (g/day): 105-140g (1.5-2gPro/kg IBW)  Method Used for Fluid Requirements: Other (Comment)  Fluid (ml/day): per MD    Nutrition Diagnosis:   Inadequate protein-energy intake related to impaired respiratory function as evidenced by NPO or clear liquid status due to medical condition    Nutrition Interventions:   Food and/or Nutrient Delivery: Start Tube Feeding  Nutrition Education/Counseling: No recommendation at this time  Coordination of Nutrition Care: Continue to monitor while inpatient, Interdisciplinary Rounds       Goals:     Goals: Initiate nutrition support, by next RD assessment (with residuals <581mL and no signs/sx of intolerance)       Nutrition Monitoring and Evaluation:   Behavioral-Environmental Outcomes: None Identified  Food/Nutrient Intake Outcomes: Enteral Nutrition Intake/Tolerance, IVF Intake  Physical Signs/Symptoms Outcomes: Biochemical Data, Nutrition Focused Physical Findings, Skin, Weight, GI Status, Hemodynamic Status, Fluid Status or Edema    Discharge Planning:    Too soon to determine     [address]m, RD, CNSC  Contact: ext [phone]

## 2022-06-04 NOTE — Progress Notes (Addendum)
2050-call placed to IR for drain, information given, IR to call back.    2140-per Allie, NP, IR will place drain tomorrow(06/05/2022), call placed to Morton, RN([facility]) to restart CVVH.

## 2022-06-05 ENCOUNTER — Inpatient Hospital Stay: Payer: MEDICARE

## 2022-06-05 ENCOUNTER — Inpatient Hospital Stay: Admit: 2022-06-05 | Payer: MEDICARE

## 2022-06-05 DIAGNOSIS — A4151 Sepsis due to Escherichia coli [E. coli]: Secondary | ICD-10-CM

## 2022-06-05 LAB — CBC
Hematocrit: 28.7 % — ABNORMAL LOW (ref 36.6–50.3)
Hematocrit: 30.1 % — ABNORMAL LOW (ref 36.6–50.3)
Hemoglobin: 9.7 g/dL — ABNORMAL LOW (ref 12.1–17.0)
Hemoglobin: 10.3 g/dL — ABNORMAL LOW (ref 12.1–17.0)
MCH: 29.6 PG (ref 26.0–34.0)
MCH: 29.9 PG (ref 26.0–34.0)
MCHC: 33.8 g/dL (ref 30.0–36.5)
MCHC: 34.2 g/dL (ref 30.0–36.5)
MCV: 87.5 FL (ref 80.0–99.0)
MCV: 87.5 FL (ref 80.0–99.0)
MPV: ABNORMAL FL (ref 8.9–12.9)
MPV: ABNORMAL FL (ref 8.9–12.9)
Nucleated RBCs: 0.5 PER 100 WBC — ABNORMAL HIGH
Nucleated RBCs: 1 PER 100 WBC — ABNORMAL HIGH
Platelets: 33 10*3/uL — CL (ref 150–400)
Platelets: 51 10*3/uL — ABNORMAL LOW (ref 150–400)
RBC: 3.28 M/uL — ABNORMAL LOW (ref 4.10–5.70)
RBC: 3.44 M/uL — ABNORMAL LOW (ref 4.10–5.70)
RDW: 14.8 % — ABNORMAL HIGH (ref 11.5–14.5)
RDW: 15 % — ABNORMAL HIGH (ref 11.5–14.5)
WBC: 25.8 10*3/uL — ABNORMAL HIGH (ref 4.1–11.1)
WBC: 36.1 10*3/uL — ABNORMAL HIGH (ref 4.1–11.1)
nRBC: 0.14 10*3/uL — ABNORMAL HIGH (ref 0.00–0.01)
nRBC: 0.36 10*3/uL — ABNORMAL HIGH (ref 0.00–0.01)

## 2022-06-05 LAB — BASIC METABOLIC PANEL
Anion Gap: 8 mmol/L (ref 5–15)
Anion Gap: 9 mmol/L (ref 5–15)
BUN: 58 MG/DL — ABNORMAL HIGH (ref 6–20)
BUN: 63 MG/DL — ABNORMAL HIGH (ref 6–20)
Bun/Cre Ratio: 24 — ABNORMAL HIGH (ref 12–20)
Bun/Cre Ratio: 24 — ABNORMAL HIGH (ref 12–20)
CO2: 24 mmol/L (ref 21–32)
CO2: 23 mmol/L (ref 21–32)
Calcium: 8.3 MG/DL — ABNORMAL LOW (ref 8.5–10.1)
Calcium: 8.5 MG/DL (ref 8.5–10.1)
Chloride: 105 mmol/L (ref 97–108)
Chloride: 104 mmol/L (ref 97–108)
Creatinine: 2.39 MG/DL — ABNORMAL HIGH (ref 0.70–1.30)
Creatinine: 2.65 MG/DL — ABNORMAL HIGH (ref 0.70–1.30)
Est, Glom Filt Rate: 28 mL/min/{1.73_m2} — ABNORMAL LOW (ref >60)
Est, Glom Filt Rate: 24 mL/min/{1.73_m2} — ABNORMAL LOW (ref >60)
Glucose: 226 mg/dL — ABNORMAL HIGH (ref 65–100)
Glucose: 235 mg/dL — ABNORMAL HIGH (ref 65–100)
Potassium: 3.5 mmol/L (ref 3.5–5.1)
Potassium: 3.5 mmol/L (ref 3.5–5.1)
Sodium: 137 mmol/L (ref 136–145)
Sodium: 136 mmol/L (ref 136–145)

## 2022-06-05 LAB — MAGNESIUM
Magnesium: 2 mg/dL (ref 1.6–2.4)
Magnesium: 2.2 mg/dL (ref 1.6–2.4)

## 2022-06-05 LAB — HEPATIC FUNCTION PANEL
ALT: 668 U/L — ABNORMAL HIGH (ref 12–78)
AST: 403 U/L — ABNORMAL HIGH (ref 15–37)
Albumin/Globulin Ratio: 0.5 — ABNORMAL LOW (ref 1.1–2.2)
Albumin: 1.7 g/dL — ABNORMAL LOW (ref 3.5–5.0)
Alk Phosphatase: 197 U/L — ABNORMAL HIGH (ref 45–117)
Bilirubin, Direct: 6.3 MG/DL — ABNORMAL HIGH (ref 0.0–0.2)
Globulin: 3.2 g/dL (ref 2.0–4.0)
Total Bilirubin: 7.8 MG/DL — ABNORMAL HIGH (ref 0.2–1.0)
Total Protein: 4.9 g/dL — ABNORMAL LOW (ref 6.4–8.2)

## 2022-06-05 LAB — POCT BLOOD GAS & ELECTROLYTES
Anion Gap, POC: 9 — ABNORMAL LOW (ref 10–20)
BASE DEFICIT (POC): 1.9 mmol/L
HCO3, Arterial: 21 mmol/L
POC Chloride: 105 MMOL/L (ref 100–108)
POC Creatinine: 2.1 MG/DL — ABNORMAL HIGH (ref 0.6–1.3)
POC Glucose: 246 MG/DL — ABNORMAL HIGH (ref 74–106)
POC Ionized Calcium: 1.19 mmol/L (ref 1.12–1.32)
POC Lactic Acid: 1.22 mmol/L (ref 0.40–2.00)
POC O2 SAT: 96 %
POC Potassium: 3.7 MMOL/L (ref 3.5–5.5)
POC Sodium: 135 MMOL/L — ABNORMAL LOW (ref 136–145)
POC TCO2: 21 MMOL/L (ref 19–24)
eGFR, POC: 32 mL/min/{1.73_m2} — ABNORMAL LOW (ref >60)
pCO2, Arterial, POC: 29.5 MMHG — ABNORMAL LOW (ref 35.0–45.0)
pH, Arterial, POC: 7.46 — ABNORMAL HIGH (ref 7.35–7.45)
pO2, Arterial, POC: 77 MMHG — ABNORMAL LOW (ref 80–100)

## 2022-06-05 LAB — POC LACTIC ACID: POC Lactic Acid: 5.13 mmol/L (ref 0.40–2.00)

## 2022-06-05 LAB — PHOSPHORUS
Phosphorus: 3.2 MG/DL (ref 2.6–4.7)
Phosphorus: 4.1 MG/DL (ref 2.6–4.7)

## 2022-06-05 LAB — POCT GLUCOSE
POC Glucose: 235 mg/dL — ABNORMAL HIGH (ref 65–100)
POC Glucose: 237 mg/dL — ABNORMAL HIGH (ref 65–100)
POC Glucose: 223 mg/dL — ABNORMAL HIGH (ref 65–117)
POC Glucose: 231 mg/dL — ABNORMAL HIGH (ref 65–117)

## 2022-06-05 LAB — LACTIC ACID: Lactic Acid, Plasma: 1.4 MMOL/L (ref 0.4–2.0)

## 2022-06-05 LAB — VENOUS BLOOD GAS, POINT OF CARE: PH, VENOUS (POC): 7.4 (ref 7.32–7.42)

## 2022-06-05 LAB — AMMONIA: Ammonia: <10 umol/L (ref <32)

## 2022-06-05 LAB — FERRITIN: Ferritin: 2661 NG/ML — ABNORMAL HIGH (ref 26–388)

## 2022-06-05 MED ORDER — VANCOMYCIN HCL 1 G IV SOLR
1 g | Freq: Two times a day (BID) | INTRAVENOUS | Status: AC
Start: 2022-06-05 — End: 2022-06-07
  Administered 2022-06-06 – 2022-06-07 (×3): 1000 mg via INTRAVENOUS

## 2022-06-05 MED ORDER — HYDROCORTISONE SOD SUC (PF) 100 MG IJ SOLR (ACT-0-VIAL)
100 | Freq: Two times a day (BID) | INTRAMUSCULAR | Status: DC
Start: 2022-06-05 — End: 2022-06-07
  Administered 2022-06-06 – 2022-06-07 (×4): 50 mg via INTRAVENOUS

## 2022-06-05 MED ORDER — INSULIN NPH (HUMAN) (ISOPHANE) 100 UNIT/ML SC SUSP
100 UNIT/ML | Freq: Two times a day (BID) | SUBCUTANEOUS | Status: AC
Start: 2022-06-05 — End: 2022-06-06
  Administered 2022-06-05 – 2022-06-06 (×3): 10 [IU] via SUBCUTANEOUS

## 2022-06-05 MED ORDER — INSULIN NPH (HUMAN) (ISOPHANE) 100 UNIT/ML SC SUSP
100 UNIT/ML | Freq: Two times a day (BID) | SUBCUTANEOUS | Status: DC
Start: 2022-06-05 — End: 2022-06-05

## 2022-06-05 MED ORDER — ANIDULAFUNGIN 100 MG IV SOLR
100 MG | INTRAVENOUS | Status: AC
Start: 2022-06-05 — End: 2022-06-10
  Administered 2022-06-06 – 2022-06-09 (×4): 100 mg via INTRAVENOUS

## 2022-06-05 MED ORDER — PRISMASOL BGK 4/2.5 DIALYSIS SOLUTION
32-4-2.5 | Status: DC
Start: 2022-06-05 — End: 2022-06-08
  Administered 2022-06-05 – 2022-06-07 (×15): via INTRAVENOUS_CENTRAL

## 2022-06-05 MED ORDER — SODIUM CHLORIDE 0.9 % IV SOLN (MINI-BAG)
0.9 % | Freq: Two times a day (BID) | INTRAVENOUS | Status: DC
Start: 2022-06-05 — End: 2022-06-05

## 2022-06-05 MED ORDER — VANCOMYCIN 2500 MG IN NS 500 ML (PREMIX) IVPB
Freq: Once | Status: AC
Start: 2022-06-05 — End: 2022-06-05
  Administered 2022-06-05: 21:00:00 2500 mg via INTRAVENOUS

## 2022-06-05 MED ORDER — SODIUM CHLORIDE 0.9 % IV SOLN (MINI-BAG)
0.9 | Freq: Two times a day (BID) | INTRAVENOUS | Status: DC
Start: 2022-06-05 — End: 2022-06-08
  Administered 2022-06-05 – 2022-06-08 (×6): 1000 mg via INTRAVENOUS

## 2022-06-05 MED ORDER — VIAL2BAG ADAPTOR (20 MM)
1 g | Freq: Two times a day (BID) | INTRAVENOUS | Status: DC
Start: 2022-06-05 — End: 2022-06-05

## 2022-06-05 MED FILL — NOREPINEPHRINE-SODIUM CHLORIDE 16-0.9 MG/250ML-% IV SOLN: INTRAVENOUS | Qty: 250

## 2022-06-05 MED FILL — AMIODARONE HCL 450 MG/9ML IV SOLN: 450 MG/9ML | INTRAVENOUS | Qty: 9

## 2022-06-05 MED FILL — SOLU-CORTEF 100 MG IJ SOLR: 100 MG | INTRAMUSCULAR | Qty: 2

## 2022-06-05 MED FILL — METRONIDAZOLE 500 MG/100ML IV SOLN: 500 MG/100ML | INTRAVENOUS | Qty: 100

## 2022-06-05 MED FILL — VASOSTRICT 20 UNIT/ML IV SOLN: 20 UNIT/ML | INTRAVENOUS | Qty: 1

## 2022-06-05 MED FILL — HUMALOG 100 UNIT/ML IJ SOLN: 100 UNIT/ML | INTRAMUSCULAR | Qty: 1

## 2022-06-05 MED FILL — CEFTRIAXONE SODIUM 2 G IJ SOLR: 2 g | INTRAMUSCULAR | Qty: 2000

## 2022-06-05 MED FILL — ERAXIS 100 MG IV SOLR: 100 MG | INTRAVENOUS | Qty: 30

## 2022-06-05 MED FILL — MERREM 1 G IV SOLR: 1 g | INTRAVENOUS | Qty: 1000

## 2022-06-05 MED FILL — FLUDROCORTISONE ACETATE 0.1 MG PO TABS: 0.1 MG | ORAL | Qty: 1

## 2022-06-05 MED FILL — BPCO EX OINT: CUTANEOUS | Qty: 60

## 2022-06-05 MED FILL — PANTOPRAZOLE SODIUM 40 MG IV SOLR: 40 MG | INTRAVENOUS | Qty: 40

## 2022-06-05 MED FILL — FENTANYL CITRATE-NACL 1-0.9 MG/100ML-% IV SOLN: INTRAVENOUS | Qty: 100

## 2022-06-05 MED FILL — CHLORHEXIDINE GLUCONATE 0.12 % MT SOLN: 0.12 % | OROMUCOSAL | Qty: 15

## 2022-06-05 MED FILL — HUMULIN N 100 UNIT/ML SC SUSP: 100 UNIT/ML | SUBCUTANEOUS | Qty: 1

## 2022-06-05 MED FILL — VANCOMYCIN 2500 MG IN NS 500 ML (PREMIX) IVPB: Qty: 500

## 2022-06-05 NOTE — Other (Signed)
06/05/22 1430   Treatment   Time On 1430   Observations & Evaluations   Level of Consciousness 2   Heart Rhythm Regular   O2 Device Ventilator   Edema Generalized +1;Non-pitting   Vital Signs   BP (!) 119/49   Pulse 65   Respirations 20   SpO2 97 %   During Hemodialysis Assessment   ABP (Arterial line BP) 114/41   Hemodialysis Central Access Right Femoral vein   Placement Date/Time: 06/01/22 1830   Present on Admission/Arrival: No  Inserted by: Dr. Christel Mormon  Insertion Practices: Chlorohexadine skin antisepsis;Maximal barrier precautions;Sterile ultrasound technique;Optimal catheter site selection;Betadine...   Continued need for line? Yes   Site Assessment Clean, dry & intact   Venous Lumen Status Infusing   Arterial Lumen Status Infusing   Alcohol Cap Used No   Line Care Ports disinfected;Connections checked and tightened   Dressing Type Bacteriocidal;Transparent   Date of Last Dressing Change 06/05/22   Dressing Status Clean, dry & intact   Dressing Change Due 06/09/22        06/05/22 1430   Treatment   $CRRT $Yes   Machine #   ([id number])   Pressures   Access (mmHg) -72 mmHg   Return/Venous (mmHg) 94 mmHg   Effluent (mmHg) 134 mmHg   Filter (mmHg) 202 mmHg   TMP Pressure (mmHg) 6 mmHg   Pressure Drop (mmHg) 63 mmHg   Deaeration Chamber Check Yes   Flow Rates   Blood Flow (mL/min) 250 mL/min   Dialysate (mL/hr) 2550 mL/hr   Non-CRRT Intake   IV/IVPB (mL) 22 mL   Total Non-CRRT Intake (Calculated) 22   Non-CRRT Output   Urine (mL) 0   Total Non-CRRT Output  (Calculated) 0 mL   NxStage Calculation   (A) Total Non-NxStage Intake (mL) 22 mL   (C) Total Non-NxStage Output (mL) 0 mL/hr   Prisma Calculation   (A) Total Non-Prisma Intake (mL) 22 mL   (B) Total Non-Prisma Output (mL) 0 mL   (C) Balance (A-B) 22 ml   (D) Physician Ordered Hourly Removal (mL) 50 ml   (F) Amount Programmed to Remove (H from last hour) 75   Hemodialysis Central Access Right Femoral vein   Placement Date/Time: 06/01/22 1830   Present on  Admission/Arrival: No  Inserted by: Dr. Christel Mormon  Insertion Practices: Chlorohexadine skin antisepsis;Maximal barrier precautions;Sterile ultrasound technique;Optimal catheter site selection;Betadine...   Continued need for line? Yes   Site Assessment Clean, dry & intact   Venous Lumen Status Infusing   Arterial Lumen Status Infusing   Alcohol Cap Used No   Line Care Ports disinfected;Connections checked and tightened   Dressing Type Bacteriocidal;Transparent   Date of Last Dressing Change 06/05/22   Dressing Status Clean, dry & intact   Dressing Change Due 06/09/22     Primary RN SBAR: Illene Labrador, RN  Patient Education: answered questions for primary RNs  Hepatitis B Surface Ag   Date/Time Value Ref Range Status   06/01/2022 10:08 PM <0.10 Index Final     Hep B S Ab   Date/Time Value Ref Range Status   06/01/2022 10:08 PM <3.10 mIU/mL Final      Went to round on patient. Patient was off machine and just had just returned from a procedure.  Labs, orders, and code status reviewed. Machine stripped. All lines and set discarded in red biohazard bags.    Machine set up and primed per policy. Patient back on CVVHD at 1630.  Education provided to primary RNs

## 2022-06-05 NOTE — Progress Notes (Addendum)
0700: Bedside shift report from [facility] & Melissa RN    0800: Pt assessment. Pt intubated and sedated (fentanyl ). Pt responds to sternal rub. Gag present. Pupils unequal slightly, reactive to light, MD aware. On ventilator ETT present 50% O2 peep of 5. O2 sat of 96%. Breath sounds are diminished but clear bilaterally. Bowel sounds hypoactive throughout. Pt has an NGT, yellow/brown output. Pt has a CVC & quinton in use and patient. CRRT in progress, 0 factor. Pt tolerating well. Levophed (2 mcg) and Vasopressin (0.03 mcg) in use to keep sbp >100. Pt also has an A-line that has appropriate waveform, rezeroed, and leveled. Pedal pulses dopplerable, not palpable, MD aware. Pt feet's are mottled and pale. Pt has midline incsion to abd, 2 JP drains present, one with serosanguinous output and the other with dark red output. Pt has a fissure present in the gluteal crack.Pt is currently in bilateral wrist restraints per MD.     Darrel HooverJeralyn Ruths MD bedside. Orders to start pulling a factor of 25 on CRRT.     0931: Vasopressin now stopped per Dr. Rutherford Limerick. Levophed titrated to 4 mcgs to keep a SBP above 100.     1015: Urine bag alarm as flow problem. Attempted to fix alarm & was unable to. Returned blood to pt. Will call [facility].    1032: Spoke to CT regarding drain placement. Awaiting call back for specific time. Per Dr. Rutherford Limerick, leave patient off CRRT until after drain is placed today. Will make [facility] aware.     1145: Fentanyl stopped.    1200: Assessment complete, no changes at this time.     1240:Pt taken down to CT by nurses and respiratory     1350: Pt brought back to unit by nurses and respiratory.     1400: [facility] is at bedside with patient restarting CRRT at this time. Assessment complete, no changes at this time.      1430: Pt is back on CRRT at this time    1500: Pt has been given a chg bath & abd dressing has been redressed. Pt's a-line is dampened so we are going off cuff pressures.    1507: Per Dr.Farooq,  orders for RT to art stick pt to collect blood cultures. RT able to collect one pair of blood cultures.     1600: Assessment complete, no changes at this time. Tube feedings started per order.    1626: Sinnatamby MD at bedside. Updated on pt condition.    1634: PICC team bedside to draw a pair of blood culture.    1715: Called Dr Allena Katz to notify about BMP results. Orders received to switch pt from a 2K bath to a 4K bath through his dialysate.    1900: Bedside report given to Gerarda Gunther, Charity fundraiser.

## 2022-06-05 NOTE — Progress Notes (Signed)
1910: Bedside shift change report given to Jeffie Pollock., RN/Andi B., RN (oncoming nurses) by Joellen Jersey., RN/Cassandra W., RN (offgoing nurses). Report included the following information Nurse Handoff Report, Intake/Output, MAR, Recent Results, Med Rec Status, and Cardiac Rhythm NSR w/ ST Elevation .

## 2022-06-05 NOTE — Progress Notes (Signed)
Surgery Progress    Patient seen and evaluated.  He is post percutaneous drainage of hepatic abscess.  Drain has emptied out some air as well as a scant amount of blood since coming back from the procedure.  He remains on 8 of levo, was on 6 preprocedure.  Remaining surgical drains are stable.  Follow up hgb.  Not following commands off sedation.  Not entirely surprising given that he was on Versed for quite a while and would likely take additional time to clear given his relative hepatic dysfunction currently.    Laurence Compton, MD  General Surgery

## 2022-06-05 NOTE — Progress Notes (Incomplete)
1900: Report received at bedside from Joellen Jersey, RN in [employer] with all questions and concerns addressed.     2000:

## 2022-06-05 NOTE — Other (Addendum)
[place]  [facility]  DIABETES MANAGEMENT CONSULT    Consulted by Provider for advanced nursing evaluation and care for inpatient blood glucose management.    Evaluation and Action Plan   This 76 year old Caucasian male was admitted with RUQ pain and underwent laparotomy 06/01/22. Septic shock with multi-organ failure. Several episodes of Vtach. Remains on vent support, pressors & CRRT. There is a new finding of liver abscess on CT. There is concern for LLE due to diminished pedal pulse. Per vascular, he is not a candidate for endovascular or surgical revascularization at this time.    As for BG management in this patient with known Type 2 diabetes, began low dose basal insulin 06/02/22 along with corrective insulin to address impact of steroid. Bgs had been 170-180s but are continuing to rise, now in the 200s. Will require more insulin.    Management Rationale Action Plan   Medication   Overriding steroid effect Based on BG pattern Increase NPH insulin to 10 units twice daily   Additional orders   Trickle feeds may begin at some time today          Initial Presentation   Jaime [name] is a 76 y.o. male admitted 05/31/22 from ER after experiencing RUQ pain associated with generalized weakness, nausea & vomiting for several days PTA. AMS+. Hypotension+. Fever+. Dyspneic+. He is visiting from [place].  Ectopic atrial tachycardia  ER exam:  Cardiovascular:      Rate and Rhythm: Regular rhythm. Tachycardia present.   Pulmonary:      Comments: He is tachypneic, coarse breath sounds on inspiration at bilateral bases  Abdominal:      Palpations: Abdomen is soft.      Comments: Tender to palpation diffusely in the right sided abdomen without guarding or rebound tenderness   LAB: WBC 16.6. Normal H&H. Low platelets.BG 272/AG 17. AKI. Elevated liver enzymes. NT pro-BNP 20,980. Lipase >3000.     CXR:   Right IJ catheter satisfactory position without pneumothorax. ET   tube and NG tube as above   CT  Head: Negative  CT ABd:   Extraluminal air bubbles in the right upper quadrant concerning for gastric   perforation. Inflammatory changes are noted about the tail of the pancreas   extending into the left anterior pararenal space, please correlate with any   evidence of acute pancreatitis. Bilateral lower lobe infiltrates.     HX:No past medical history on file.     INITIAL DX: Septicemia (HCC) [A41.9]  Gastric perforation (HCC) [K25.5]  Perforated abdominal viscus [R19.8]     Current Treatment     TX: 06/01/22 LAPAROTOMY EXPLORATORY  LAPAROSCOPY DIAGNOSTIC  ENDOSCOPY OF ILEAL CONDUIT  EGD    Hospital Course   Clinical progress has been complicated by multi-organ failure.   06/01/22 Dialysis  06/02/22 In septic shock with multi-organ failure. On vent support, sedation, pressor support and bicarb infusion. On Abx. Two runs of VT this morning requiring defibrillation; cardiology conversation anticipated. CRRT+  06/03/22 Remains on vent support C vent Fi02 50%/Peep 7. Low grade fever; on Abx. Steroids+. Afib/bradycardic. Pressors+. CRRT+.   06/04/22 Remains on vent support and pressors. On Abx. Concern for LLE. CRRT. CT: Enlarged left hepatic abscess contain gas with other areas of probable abscess/phlegmonous infection.  06/05/22 On AC vent support. On Amio, vaso, levo & Fentanyl infusions. OG draining green fluid. CRRT+.  Vascular: Patient remains at risk for progressive deterioration and limb loss but needs to be weaned  from pressor support before intervention is safe.    Diabetes History   Type 2 diabetes on metformin therapy    Diabetes-related Medical History deferred    Diabetes Medication History - based on PTA list  Drug class Currently in use   Biguanide [x]  Metformin (Glucophage)  []  Metformin ER (Glucophage XL)     Diabetes self-management practices: deferred  Overall evaluation:    [x]  Unknown A1c     Subjective   NA     Objective   Physical exam  General Obese male who is ill-appearing  Neuro  Sedated  Vital  Signs   Vitals:    06/05/22 0800   BP: (!) 102/24   Pulse: 64   Resp: 20   Temp: 98.6 F (37 C)   SpO2: 96%     Extremities No foot wounds    Diabetic foot exam:    Left Foot     Visual Exam: Cool & dry. Thickened toenails    Pulse DP: +1   Right Foot   Visual Exam: Cool & dry. Thickened toenails    Pulse DP: +1      Laboratory  Recent Labs     06/04/22  0307 06/04/22  1708 06/05/22  0316   WBC 31.3* 29.9* 25.8*   HGB 11.0* 10.4* 9.7*   HCT 32.7* 30.6* 28.7*   MCV 88.9 89.0 87.5   PLT 30* 31* 33*       Recent Labs     06/04/22  0307 06/04/22  1707 06/04/22  1708 06/04/22  2047 06/05/22  0316   NA 135*  --  137  --  137   K 3.9  --  3.7  --  3.5   CL 105  --  106  --  105   CO2 21  --  21  --  24   PHOS 3.6  --  3.5  --  3.2   BUN 48*  --  49*  --  58*   CREATININE 2.23*   < > 1.99* 2.1* 2.39*    < > = values in this interval not displayed.       Lab Results   Component Value Date    ALT 668 (H) 06/05/2022    AST 403 (H) 06/05/2022    ALKPHOS 197 (H) 06/05/2022    BILITOT 7.8 (H) 06/05/2022     No results found for: TSH, TSHREFLEX, TSHFT4, TSHELE, TSH3GEN, TSHHS   Lab Results   Component Value Date    LABA1C 7.7 (H) 06/02/2022     Factors impacting BG management  Factor Dose Comments   Nutrition:  NPO       Drugs:  Vasopressor load  Steroids   Levo/Vasopressin infusions  HC 3XD  Florinef D   Affects insulin delivery  Impairs insulin action   Pain Fent & Versed infusions    Infection Eraxis Q24 hrs  Rocephin Q24 hrs  Flagyl Q8 hrs WBCs elevated   Other:   Kidney function  Liver function   AKI  Liver enzymes elevated      Blood glucose pattern      Significant diabetes-related events over the past 24-72 hours  05/31/22 Admission BG 233  06/01/22 Dex 4mg  => BG 100s => HC started  06/02/22 Bgs into 200s  06/03/22 Bgs in 170-180s  06/04/22 Bgs rising abit. HC reduced frequency today. Trickle feeds to begin  06/05/22 Bgs rising into 200s.     Assessment and Nursing Intervention  Nursing Diagnosis Risk for unstable blood glucose  pattern   Nursing Intervention Domain 5250 Decision-making Support   Nursing Interventions Examined current inpatient diabetes/blood glucose control   Explored factors facilitating and impeding inpatient management  Explored corrective strategies with patient and responsible inpatient provider   Informed patient of rational for insulin strategy while hospitalized     Billing Code(s)   []  99233 IP subsequent hospital care - 50 minutes   [x]  99232 IP subsequent hospital care - 35 minutes   []  99231 IP subsequent hospital care - 25 minutes   []  99221 IP initial hospital care - 40 minutes     Before making these care recommendations, I personally reviewed the hospitalization record, including notes, laboratory & diagnostic data and current medications, and examined the patient at the bedside (circumstances permitting) before determining care. More than fifty (50) percent of the time was spent in patient counseling and/or care coordination.  Total minutes: 35    , APRN - CNS  Clinical Nurse Specialist - Diabetes & endocrine disorders  [facility]  Access via [employer]

## 2022-06-05 NOTE — Progress Notes (Signed)
Progress Note  Date:06/05/2022       Room:2513/01  Patient Name:Jaime [name]     Date of Birth:10-04-1946     Age:76 y.o.    Subjective    Antwann Preziosi is an obese 76 y.o. male with a pmhx significant for DM, HTN, HLD, HFpEF, AS, and CKD. He is a non-smoker. He is admitted to the hospital with septic shock secondary to a suspected perforated viscus w/ enterobacteriaceae and ecoli bacteremia.  He is s/p emergent exploratory laparotomy 06/01/22.  His course is complicated by multiorgan failure.  He is currently intubated, sedated, and on CRRT with vasopressor support.  He has had multiple runs of VT and is s/p electric CV x 2.  He is currently on an amiodarone drip.  He is thrombocytopenic.  Yesterday evening a CT revealed a liver abscess.  Plan is for drainage.  We are following for peripheral ischemia.    Progressive cyanosis of the bilateral toes this am.  Feet remain viable.     Objective         Vitals Last 24 Hours:  TEMPERATURE:  Temp  Avg: 98.8 F (37.1 C)  Min: 98.4 F (36.9 C)  Max: 99 F (37.2 C)  RESPIRATIONS RANGE: Resp  Avg: 20.2  Min: 18  Max: 23  PULSE OXIMETRY RANGE: SpO2  Avg: 94.6 %  Min: 91 %  Max: 99 %  PULSE RANGE: Pulse  Avg: 60.5  Min: 57  Max: 64  BLOOD PRESSURE RANGE: Systolic (24hrs), Avg:113 , Min:98 , Max:138   ; Diastolic (24hrs), Avg:52, Min:24, Max:68    I/O (24Hr):    Intake/Output Summary (Last 24 hours) at 06/05/2022 0840  Last data filed at 06/05/2022 0800  Gross per 24 hour   Intake 2669.17 ml   Output 1932 ml   Net 737.17 ml     Physical Exam  Constitutional:       Interventions: He is intubated.      Comments: Acutely ill appearing male    Eyes:      Pupils: Pupils are equal, round, and reactive to light.   Nose: NGT  Cardiovascular:      Rate and Rhythm: Normal rate. Rhythm irregular.      Comments: Feet are cold without palpable pulses bilaterally.  Cyanosis of the bilateral toes this am and forefoot plantar area.   Pulmonary:      Effort: No tachypnea, accessory muscle usage  or respiratory distress. He is intubated.   Abdominal:      General: There is no distension.      Palpations: Abdomen is soft.      Comments: Abdominal incisions with dressing intact   Skin:     Coloration: Skin is pale.   Labs/Imaging/Diagnostics    Labs:  CBC:  Recent Labs     06/04/22  0307 06/04/22  1708 06/05/22  0316   WBC 31.3* 29.9* 25.8*   RBC 3.68* 3.44* 3.28*   HGB 11.0* 10.4* 9.7*   HCT 32.7* 30.6* 28.7*   MCV 88.9 89.0 87.5   RDW 14.7* 14.9* 14.8*   PLT 30* 31* 33*     CHEMISTRIES:  Recent Labs     06/04/22  0307 06/04/22  1707 06/04/22  1708 06/04/22  2047 06/05/22  0316   NA 135*  --  137  --  137   K 3.9  --  3.7  --  3.5   CL 105  --  106  --  105  CO2 21  --  21  --  24   BUN 48*  --  49*  --  58*   CREATININE 2.23*   < > 1.99* 2.1* 2.39*   GLUCOSE 198*  --  205*  --  226*   PHOS 3.6  --  3.5  --  3.2   MG 2.2  --  2.1  --  2.0    < > = values in this interval not displayed.     PT/INR:No results for input(s): PROTIME, INR in the last 72 hours.  APTT:No results for input(s): APTT in the last 72 hours.  LIVER PROFILE:  Recent Labs     06/03/22  0403 06/05/22  0316   AST 1,419* 403*   ALT 1,519* 668*   BILIDIR  --  6.3*   BILITOT 8.3* 7.8*   ALKPHOS 440* 197*     Assessment//Plan           Diminished pulses of the lower extremity  Diabetic neuropathy   Decreased peripheral perfusion of the bilateral lower extremities due to shock.  The patient remains on Levophed and Vasopressin.  No role for further evaluation or procedural intervention at this time.  Patient is critically ill with hypotension and thrombocytopenia.  He would not be a candidate for endovascular or surgical revascularization at this time.  Once he has been weaned from vasopressor support  ABIs can be obtained.  Patient remains at risk for progressive deterioration and limb loss.  We will check back with patient on Monday.  Please call with any urgent vascular issues.      Perforated Viscus  -managed by Dr. Lenard Forth  Large left lobe  hepatic abscess   -with other areas of probable abscess/phlegmonous infection scattered throughout the right lobe liver  Enterobacteriaceae and Ecoli septicemia   Septic shock/Lactic acidosis  -remains on vasopressor support  -progressive leukocytosis  Shock liver  -with Hyperbilirubinemia, Thrombocytopenia (PLT count 33), and Coagulopathy (INR 2.0)  Hypoalbuminemia      Acute hypoxic respiratory failure  -intubated 7/31      Subendocardial ischemia  -Troponin peaked at 173  -Likely demand ischemia  Ventricular tachycardia  -electric CV x 2   Persistent atrial fibrillation  -amio drip   Bilateral pleural effusions  Acute on chronic grade 1 diastolic congestive heart failure  Moderate aortic stenosis  History of hypertension  Mixed hyperlipidemia   -triglycerides 1118  Cardiology following     Acute kidney injury  Oliguria  -on CRRT  Chronic renal disease stage 3B  -baseline creatinine 1.7  Elevated CK  Nephrology following       Acute encephalopathy      Diabetes mellitus w/ mild hyperglycemia   Obesity      Management of comorbid conditions by CC team.     VTE Prophylaxis:  N/A PLT count 33  SCDs contraindicated in diffuse poor perfusion     Disposition:  TBD-patient remains critically ill

## 2022-06-05 NOTE — Progress Notes (Signed)
[facility] PROGRESS NOTE.      Name: Jaime [name]   DOB: [birth year]   MRN: [id number]   Date: 06/05/2022      Chief Complaint   Patient presents with    Hypotension     Hx hypertension - BP is 70s/40s    Altered Mental Status    Nausea    Emesis       Reason for ICU:     Septic shock with multi organ failure  Acute abdomen, s/p emergent exploratory laparotomy 06/01/22    Subjective/Hospital Course:     76 y/o male ([place] from [place]) with PMHx significant of HTN, HLD and T2DM. Came in ED 7/30 noon for N/V/Abd pain and generalized weakess. Found to be encephalopathic, shock state (BP 80s and lactate 10), AKI (Cr 4.06). Received 2L LR, started on Levophed, Vancomycin/Zosyn. Surgery consulted and patient underwent emergent exploratory laparotomy last night that did not reveal perforation but fluid collection in right upper quadrant that was sent for cultures and sensitivity.   7/31/: Patient transferred to ICU post op early AM. Increasing pressor needs, shock liver, thrombocytopenia, coagulopathic, anuric AKI with developing hyperkalemia and started on [name]. Stress dose steroids and thiamine added for refractory shock. Abx changed to Vanc and merrem.   06/02/22: VT episodes with pulse, shocked x 3 and amio boluses 150 mg each x 3 fallowed by amio drip. Cardio consulted.   8/3: Intubated and sedated. ON of levophed and vasopressin, on 50% Fio2 and PEEP of 7. CRRT ongoing with no factor. RASS neg 5. On 0.5mg /hr of versed gtt. Which is I turned off during rounds.   8/4: Remains intubated and sedated. On fentanyl gtt. Still RASS is neg 5. Down to of levophed. On CRRT with factor of 62ml/hr started today. Found to have liver abscesses on CT abd, plan to place drain today by IR.      Principle ICU Diagnosis     # Septic shock, 2/2 intra abdominal source.    # Acute abdomen from suspected abdominal viscus perforation   S/p emergent exploratory laparotomy 06/01/22    Stress dose steroids added  06/01/22   Fludrocortisone added 06/02/22   Vanc stopped 06/02/22.    BC growing enterobacter and e coli 7/31   Currently on rocephin and flagyl.   Repeat CT on 8/3 showed Liver abscesses.     # Elevated troponin, likely demand ischemia with non specific EKG changes  # VT with further hemodynamic compromise 06/02/22, controlled now   S/p electric CV x 2 followed by amio bolus 150 mg x2 on 06/01/22 AM   On amio drip    # Acute respiratory failure with hypoxemia and hypercapnia, ventilatory dependant   Intubated 06/01/22    # Acute encephalopathy, likely metabolic and toxic from sepsis  # Propofol infusion toxicity/ intolerance   Rising Cpk TG and cardiac arrythmia noted 06/02/22   Propofol changed to versed 06/02/22 but now bother are off.     # Acute Kidney injury, anuric. Likely ATN   CVVHDF initiated 06/01/22 through right Fem NTC    # Multi organ failure including shock liver  # Coagulopathy, elevated PT, INR with no evidence of obvious bleeding  # Thrombocytopenia, Likely from sepsis.   Fibrinogen 651    Plan  - Weaning stress dose steroids, DCed florinef and wean levophed for MAP > 65  - DCed versed gtt. Wean fentanyl gtt as well and try to get off sedation to assess  mental status.   - Continue lung protective vent support per protocol  - On rocephin and eraxis and flagyl added on 8/3. Has ecoli bacteremia.   -Repeat CT abd on 8/3 showed liver abscesses, IR to place drain. FU cultures.   - Start trickle feeds.   - Continue CVVHDF per nephrology, zero net fluid removal for now      I personally spent 40 minutes of critical care time.  This is time spent at this critically ill patient's bedside actively involved in patient care as well as the coordination of care and discussions with the patient's family.  This does not include any procedural time which has been billed separately.       CRITICAL CARE CONSULTANT NOTE  I had a face to face encounter with the patient, reviewed and interpreted patient data including clinical  events, labs, images, vital signs, I/O's, and examined patient.  I have discussed the case and the plan and management of the patient's care with the consulting services, the bedside nurses and the respiratory therapist.      NOTE OF PERSONAL INVOLVEMENT IN CARE   This patient has a high probability of imminent, clinically significant deterioration, which requires the highest level of preparedness to intervene urgently. I participated in the decision-making and personally managed or directed the management of the following life and organ supporting interventions that required my frequent assessment to treat or prevent imminent deterioration.    Modena Slater, MD   [facility]  [phone]  06/05/2022      Review of systems:     Review of Systems   Unable to obtain, patient is intubated and sedated.     Objective:     Vital Signs:  BP (!) 106/41   Pulse 64   Temp 98.6 F (37 C) (Axillary)   Resp 16   Ht 1.727 m (5\' 8" )   Wt 103.7 kg (228 lb 9.9 oz)   SpO2 96%   BMI 34.76 kg/m      Temp (24hrs), Avg:98.8 F (37.1 C), Min:98.4 F (36.9 C), Max:99 F (37.2 C)           Intake/Output:     Intake/Output Summary (Last 24 hours) at 06/05/2022 08/05/2022  Last data filed at 06/05/2022 0900  Gross per 24 hour   Intake 2701.63 ml   Output 1933 ml   Net 768.63 ml         Physical Exam  Constitutional:       Appearance: He is ill-appearing and toxic-appearing.   HENT:      Head: Normocephalic and atraumatic.   Eyes:      Pupils: Pupils are equal, round, and reactive to light.   Cardiovascular:      Rate and Rhythm: Normal rate and regular rhythm.      Pulses: Normal pulses.      Heart sounds: Normal heart sounds.   Abdominal:      General: There is distension.   Neurological:      Comments: Unable to assess   Psychiatric:      Comments: Unable to assess     Intubated and sedated.     Past Medical History:        has no past medical history on file.    Past Surgical History:      has a past surgical history that includes  laparotomy (N/A, 06/01/2022); laparoscopy (N/A, 06/01/2022); and Bladder surgery (N/A, 06/01/2022).      Home Medications:  Prior to Admission medications    Medication Sig Start Date End Date Taking? Authorizing Provider   metFORMIN (GLUCOPHAGE) 500 MG tablet Take 1 tablet by mouth daily   Yes Historical Provider, MD   metoprolol (LOPRESSOR) 100 MG tablet Take 1 tablet by mouth daily   Yes Historical Provider, MD   hydrALAZINE (APRESOLINE) 25 MG tablet Take 1 tablet by mouth 3 times daily   Yes Historical Provider, MD   atorvastatin (LIPITOR) 40 MG tablet Take 1 tablet by mouth daily   Yes Historical Provider, MD   losartan (COZAAR) 100 MG tablet Take 1 tablet by mouth daily   Yes Historical Provider, MD         Allergies/Social/Family History:     Allergies   Allergen Reactions    Augmentin [Amoxicillin-Pot Clavulanate] Hives    Codeine      Unknown reaction      Social History     Tobacco Use    Smoking status: Not on file    Smokeless tobacco: Not on file   Substance Use Topics    Alcohol use: Not on file      No family history on file.    LABS AND  DATA:   Reviewed    Peak airway pressure:      Minute ventilation:

## 2022-06-05 NOTE — Progress Notes (Signed)
NAME: Jaime [name]        DOB:  1946-03-28        MRN:  [id number]                     Assessment   :                                               Plan:  AKI  Sepsis  Hypotension  HTN  DM       AKI related to sepsis and shock. CT showed renal cysts.  He has poor UO.      CVVHD started 7/31.      Decreasing pressor requirement.  I will try a factor of 25.  Will increase to 50 as tolerated..  2K bags.  May need to switch to 4K bags.     Continue pressors for BP support.        DW RN.            Subjective:     Chief Complaint:  intubated.    Review of Systems:    Symptom Y/N Comments  Symptom Y/N Comments   Fever/Chills    Chest Pain     Poor Appetite    Edema     Cough    Abdominal Pain     Sputum    Joint Pain     SOB/DOE    Pruritis/Rash     Nausea/vomit    Tolerating PT/OT     Diarrhea    Tolerating Diet     Constipation    Other       Could not obtain due to:      Objective:     VITALS:   Last 24hrs VS reviewed since prior progress note. Most recent are:  Vitals:    06/05/22 0900   BP: (!) 106/41   Pulse: 64   Resp: 16   Temp:    SpO2:        Intake/Output Summary (Last 24 hours) at 06/05/2022 3086  Last data filed at 06/05/2022 0900  Gross per 24 hour   Intake 2701.63 ml   Output 1933 ml   Net 768.63 ml        Telemetry Reviewed:     PHYSICAL EXAM:  General: Intubated  +edema      Lab Data Reviewed: (see below)    Medications Reviewed: (see below)    PMH/SH reviewed - no change compared to H&P  ________________________________________________________________________  Care Plan discussed with:  Patient     Family      RN     Care Manager                    Consultant:          Comments   >50% of visit spent in counseling and coordination of care       ________________________________________________________________________  Marina Gravel, MD     Procedures: see electronic medical records for all procedures/Xrays and details which  were  not copied into this note but were reviewed prior to creation of Plan.      LABS:  Recent Labs     06/04/22  1708 06/05/22  0316   WBC 29.9* 25.8*   HGB 10.4* 9.7*   HCT 30.6* 28.7*   PLT 31* 33*  Recent Labs     06/04/22  0307 06/04/22  1708 06/05/22  0316   NA 135* 137 137   K 3.9 3.7 3.5   CL 105 106 105   CO2 21 21 24    BUN 48* 49* 58*   MG 2.2 2.1 2.0   PHOS 3.6 3.5 3.2       Recent Labs     06/03/22  0403 06/05/22  0316   GLOB 3.5 3.2       No results for input(s): INR, APTT in the last 72 hours.    Invalid input(s): PTP     No results for input(s): TIBC, FERR in the last 72 hours.    Invalid input(s): FE, PSAT   No results found for: FOL, RBCF   No results for input(s): PH, PCO2, PO2 in the last 72 hours.  No results for input(s): CPK, CKMB, TROPONINI in the last 72 hours.  No components found for: [facility]  @labua @    MEDICATIONS:  Current Facility-Administered Medications   Medication Dose Route Frequency    insulin NPH (HumuLIN N;NovoLIN N) injection vial 10 Units  10 Units SubCUTAneous 2 times per day    balsum peru-castor oil (VENELEX) ointment   Topical BID    norepinephrine (LEVOPHED) 16 mg in sodium chloride 0.9 % 250 mL infusion  1-100 mcg/min IntraVENous Continuous    metronidazole (FLAGYL) 500 mg in 0.9% NaCl 100 mL IVPB premix  500 mg IntraVENous Q8H    hydrocortisone sodium succinate PF (SOLU-CORTEF) injection 50 mg  50 mg IntraVENous TID    midazolam PF (VERSED) injection 2 mg  2 mg IntraVENous Q15 Min PRN    cefTRIAXone (ROCEPHIN) 2,000 mg in sodium chloride 0.9 % 50 mL IVPB (mini-bag)  2,000 mg IntraVENous Q24H    amiodarone (CORDARONE) 450 mg in dextrose 5 % 250 mL infusion (Vial2Bag)  0.5 mg/min IntraVENous Continuous    fludrocortisone (FLORINEF) tablet 0.1 mg  0.1 mg Oral Daily    glucose chewable tablet 16 g  4 tablet Oral PRN    dextrose bolus 10% 125 mL  125 mL IntraVENous PRN    Or    dextrose bolus 10% 250 mL  250 mL IntraVENous PRN    glucagon (rDNA) injection 1 mg  1 mg  SubCUTAneous PRN    dextrose 10 % infusion   IntraVENous Continuous PRN    insulin lispro (HUMALOG) injection vial 0-4 Units  0-4 Units SubCUTAneous 4 times per day    insulin lispro (HUMALOG) injection vial 0-4 Units  0-4 Units SubCUTAneous Nightly    vasopressin 20 Units in sodium chloride 0.9 % 100 mL infusion  0.03 Units/min IntraVENous Continuous    sodium chloride flush 0.9 % injection 5-40 mL  5-40 mL IntraVENous 2 times per day    sodium chloride flush 0.9 % injection 5-40 mL  5-40 mL IntraVENous PRN    0.9 % sodium chloride infusion   IntraVENous PRN    acetaminophen (TYLENOL) tablet 650 mg  650 mg Oral Q6H PRN    Or    acetaminophen (TYLENOL) suppository 650 mg  650 mg Rectal Q6H PRN    ondansetron (ZOFRAN) injection 4 mg  4 mg IntraVENous Q6H PRN    chlorhexidine (PERIDEX) 0.12 % solution 15 mL  15 mL Mouth/Throat BID    pantoprazole (PROTONIX) 40 mg in sodium chloride (PF) 0.9 % 10 mL injection  40 mg IntraVENous Q12H    anidulafungin (ERAXIS) 100 mg in sodium chloride 0.9 %  130 mL IVPB  100 mg IntraVENous Q24H    fentanyl (SUBLIMAZE) infusion 1000 mcg/151mL  25-200 mcg/hr IntraVENous Continuous    phenylephrine (NEO-SYNEPHRINE) 50 mg in sodium chloride 0.9 % 250 mL infusion (Vial2Bag)  10-300 mcg/min IntraVENous Continuous    prismaSol BGK 2/3.5 dialysis solution   Dialysis Continuous

## 2022-06-05 NOTE — Progress Notes (Signed)
Pharmacy Antimicrobial Kinetic Dosing    Indication for Antimicrobials: bloodstream infection, IAI, hepatic abscess     Current Regimen of Each Antimicrobial:  Anidulafungin 100 mg IV q24; Start Date 7/31; Day # 5  Meropenem 1000 mg IV q12h; Start Date 8/4; Day # 1  Vancomycin 2500 mg IV once, then 1000 mg IV q12h; Start Date 8/4; Day # 1    Previous Antimicrobial Therapy:  Cefepime x 1 dose         7/31  Zosyn  x 1 dose              7/31  Vancomycin x 1 dose     7/31  Metronidazole                 7/31-8/2 and 8/3-8/4  Ceftriaxone                     8/2-8/4    Goal Level: Vancomycin trough 15-20    Date Dose & Interval Measured (mcg/mL) Predicted AUC                       Significant Cultures:   7/31 blood 1: E coli, pansens  7/31 blood 2: E coli, pansens  7/31 body fluid: light E coli, pansens    Labs:  Recent Labs     Units 06/05/22  0316 06/04/22  2047 06/04/22  1708 06/04/22  1707 06/04/22  0307 06/03/22  1522 06/03/22  0403   CREATININE MG/DL 9.62* 2.1* 9.52*   < > 2.23*   < > 2.73*   BUN MG/DL 58*  --  49*  --  48*   < > 48*   WBC K/uL 25.8*  --  29.9*  --  31.3*  --  29.3*    < > = values in this interval not displayed.     Temp (24hrs), Avg:98.7 F (37.1 C), Min:98.4 F (36.9 C), Max:99 F (37.2 C)      Conditions for Dosing Consideration: CRRT    Creatinine Clearance (mL/min): CVVHD    Impression/Plan:   Repeat CT on 8/3 with large liver abscess. IR to placing drain today and will send cultures.   Vancomycin 2500 mg loading dose followed by 1000 mg IV q12h. Plan to check a level 8/6 am.   Continue meropenem 1000 mg IV q12hr   Continue anidulafungin 100 mg IV q24h  Antimicrobial stop date TBD     Pharmacy will follow daily and adjust medications as appropriate for renal function and/or serum levels.    Thank you,  Ellwood Dense, [facility]

## 2022-06-05 NOTE — Consults (Signed)
Infectious Disease Consult    Date of Consultation:  June 05, 2022  Reason for Consultation: Ongoing septic shock and now liver abscess  Referring Physician: Dr Rutherford Limerick  Date of Admission: 06/01/22    Impression    Severe sepsis  Septic shock    E. coli bacteremia  Blood cultures 7/31+ for E. coli   2/2 LAC (pan sensitive)     Acute abdomen  Pneumoperitoneum  S/p diagnostic laparoscopy, laparotomy  EGD 7/31  Findings of cloudy fluid in right upper quadrant, yellow/white  Inflammatory peel over lesser curve of stomach, inferior Koebel of liver  No gross perforation identified  Pancreas and retroperitoneum appeared edematous  Intra-Op fluid culture + for E. Coli (pansensitive)    Hepatic abscess  Gas and fluid containing collection 7.1 x 10.8 x 5.4 cm  In anterior left lobe of liver, patchy areas of hypodensity right lobe  4 x 4.5 x 5.5 cm collection  S/p  drainage by IR.Cultures - pending.    Acute hypoxic respiratory failure  Intubated 7/31    AKI  Cr 2.39, 4.6 on admission  On CV VH since 7/31    Multiorgan failure  Coagulopathy    Thrombocytopenia  Platelets 33     Transaminitis  AST 403, ALT 668, ALP 197  Improved    Diabetes type 2  Hyperglycemia  A1c 7.7    Obesity  BMI 34.76      Plan    Change to Vancomycin, Meropenem, & Eraxis IV  Blood cultures x 2  Await cultures from aspirate  Fluids pressors per ICU team  ID service to follow.      Please contact ID on call with questions, concerns over the weekend.      Extensive review of chart notes, labs, imaging, cultures done  Additionally review of done:  D/w ICU team  Jaime [name]  is a 76 y/o male PMHx of HTN, HLD and T2DM admitted 7/31 for septic shock in the setting of perforated viscus.  Presented to ED via EMS c/o approximately 2 days of severe abdominal pain, nausea and vomiting. He was in town from [place] for the [employer]  ED work- up revealed  B/P 80/42, HR 112, Afebrile, Leukocytosis 16.6, lactic acidosis 10,  AKI, Cr 4.06. CT A/P  demonstrated perforated viscous. Received 2L LR, started on Levophed, Vancomycin/Zosyn. Surgery consulted & to OR for emergent exploratory laparotomy.  Did not reveal perforation but fluid collection in the right upper quadrant.  Cultures were sent and back with E. coli pansensitive.  Blood cultures done on 7/31+ for pansensitive E. coli 2/2 LAC.  Patient transferred to ICU post op early 7/31.  Increasing pressor needs, shock liver, thrombocytopenia, coagulopathic, anuric AKI with developing hyperkalemia and started on CHHF. Stress dose steroids and thiamine added for refractory shock. Abx changed to Vanc and merrem.   On 8/1 patient developed VT episodes with pulse, shocked x 3 and amio boluses 150 mg each x 3 fallowed by amio drip. Cardio consulted.  On 8/3  He was intubated and sedated and 2 pressors.   Patient remains intubated and sedated today.  He has been found found to have liver abscesses on CT abd, plan to place drain today by IR.  Patient is on lower dose of pressors.  Antimicrobial therapy  to date  Cefepime x 1 dose         7/31  Zosyn  x 1 dose  7/31  Vancomycin x 1 dose     7/31  Metronidazole                 7/31-8/2 and 8/3-8/4  Ceftriaxone                     8/2-8/4  Eraxis                            7/31- 8/4        Pt seen today.Intubated & sedated. Discoloration of toes.  D/w ICU Team    No past medical history on file.    Past Surgical History:   Procedure Laterality Date    BLADDER SURGERY N/A 06/01/2022    ENDOSCOPY OF ILEAL CONDUIT performed by Guinevere Ferrari, MD at [facility] MAIN OR    LAPAROSCOPY N/A 06/01/2022    LAPAROSCOPY DIAGNOSTIC performed by Guinevere Ferrari, MD at [facility] OR    LAPAROTOMY N/A 06/01/2022    LAPAROTOMY EXPLORATORY performed by Guinevere Ferrari, MD at [facility] OR       Allergies   Allergen Reactions    Augmentin [Amoxicillin-Pot Clavulanate] Hives    Codeine      Unknown reaction       Social Connections: Not on file       No family status information on file.            Review of Systems - Negative except those mentioned in H&P      PHYSICAL EXAM:  General:          Intibated, sedated   EENT:              EOMI. Anicteric sclerae. MMM  Resp:               CTA bilaterally, no wheezing or rales.  No accessory muscle use  CV:                  Regular  rhythm,  No edema  GI:                   Soft, Non distended, Non tender.  +Bowel sounds  Neurologic:      Alert and oriented X 3, normal speech,   Psych:             Good insight. Not anxious nor agitated  Skin:                No rashes.  No jaundice.  Extremities :  No edema, discoloration of  toes.     Jaime Amato, MD Jerrel Ivory

## 2022-06-05 NOTE — Progress Notes (Signed)
SURGERY PROGRESS NOTE      Admit Date: 05/31/2022    POD 4 Days Post-Op    Procedure: Procedure(s):  LAPAROTOMY EXPLORATORY  LAPAROSCOPY DIAGNOSTIC        Subjective:     Intubated, sedated.  Pressors significantly improved, down to single agent of Levophed at 2.  CT scan yesterday demonstrated a massive hepatic abscess encompassing the majority of his left lobe.      Objective:     BP (!) 110/51   Pulse 67   Temp 98.6 F (37 C) (Axillary)   Resp 20   Ht 1.727 m (5\' 8" )   Wt 103.7 kg (228 lb 9.9 oz)   SpO2 94%   BMI 34.76 kg/m      Temp (24hrs), Avg:98.7 F (37.1 C), Min:98.4 F (36.9 C), Max:99 F (37.2 C)      08/04 0701 - 08/04 1900  In: 309.6 [I.V.:157.4]  Out: 58   08/02 1901 - 08/04 0700  In: 3640.1 [I.V.:1791]  Out: 2920 [Drains:415]    Physical Exam:    General:  Intubated and sedated    Abdomen: Soft, tender, hypoactive BS, JP 1 serosanguineous, JP 2 murky brown   Incision:   Dressing clean,Dry, intact          CBC:   Lab Results   Component Value Date/Time    WBC 25.8 06/05/2022 03:16 AM    RBC 3.28 06/05/2022 03:16 AM    HGB 9.7 06/05/2022 03:16 AM    HCT 28.7 06/05/2022 03:16 AM    MCV 87.5 06/05/2022 03:16 AM    MCH 29.6 06/05/2022 03:16 AM    MCHC 33.8 06/05/2022 03:16 AM    RDW 14.8 06/05/2022 03:16 AM    PLT 33 06/05/2022 03:16 AM    MPV ABNORMAL 06/05/2022 03:16 AM     BMP:    Lab Results   Component Value Date/Time    NA 137 06/05/2022 03:16 AM    K 3.5 06/05/2022 03:16 AM    CL 105 06/05/2022 03:16 AM    CO2 24 06/05/2022 03:16 AM    BUN 58 06/05/2022 03:16 AM    LABALBU 1.7 06/05/2022 03:16 AM    CREATININE 2.39 06/05/2022 03:16 AM    CALCIUM 8.3 06/05/2022 03:16 AM    LABGLOM 28 06/05/2022 03:16 AM    GLUCOSE 226 06/05/2022 03:16 AM       Assessment:     Principal Problem:    Gastric perforation (HCC)  Active Problems:    Type 2 diabetes mellitus with hyperglycemia, without long-term current use of insulin (HCC)  Resolved Problems:    * No resolved hospital problems. *    Overall  doing poorly and in multiorgan failure.  Shock liver, anuric AKI, encephalopathic.  Pressors significantly improved.  Improving leukocytosis.  Source is almost certainly left hepatic abscess, recommended IR percutaneous drainage as a first step.      Plan:     Appreciate excellent ICU care  Will continue to follow after IR drainage

## 2022-06-05 NOTE — Interdisciplinary (Signed)
Critical care interdisciplinary rounds today.  Following members present: Case Management, Diabetes Treatment, Nursing, Nutrition, Pharmacy, and Physician.     Plan of care discussed.  See clinical pathway for plan of care and interventions and desired outcomes. Continuing to wean pressors to patient's response.

## 2022-06-06 LAB — BASIC METABOLIC PANEL
Anion Gap: 7 mmol/L (ref 5–15)
Anion Gap: 7 mmol/L (ref 5–15)
Anion Gap: 7 mmol/L (ref 5–15)
BUN: 56 MG/DL — ABNORMAL HIGH (ref 6–20)
BUN: 53 MG/DL — ABNORMAL HIGH (ref 6–20)
BUN: 49 MG/DL — ABNORMAL HIGH (ref 6–20)
Bun/Cre Ratio: 23 — ABNORMAL HIGH (ref 12–20)
Bun/Cre Ratio: 23 — ABNORMAL HIGH (ref 12–20)
Bun/Cre Ratio: 22 — ABNORMAL HIGH (ref 12–20)
CO2: 24 mmol/L (ref 21–32)
CO2: 24 mmol/L (ref 21–32)
CO2: 25 mmol/L (ref 21–32)
Calcium: 8.4 MG/DL — ABNORMAL LOW (ref 8.5–10.1)
Calcium: 8.1 MG/DL — ABNORMAL LOW (ref 8.5–10.1)
Calcium: 7.7 MG/DL — ABNORMAL LOW (ref 8.5–10.1)
Chloride: 104 mmol/L (ref 97–108)
Chloride: 106 mmol/L (ref 97–108)
Chloride: 106 mmol/L (ref 97–108)
Creatinine: 2.43 MG/DL — ABNORMAL HIGH (ref 0.70–1.30)
Creatinine: 2.28 MG/DL — ABNORMAL HIGH (ref 0.70–1.30)
Creatinine: 2.18 MG/DL — ABNORMAL HIGH (ref 0.70–1.30)
Est, Glom Filt Rate: 27 mL/min/{1.73_m2} — ABNORMAL LOW (ref >60)
Est, Glom Filt Rate: 29 mL/min/{1.73_m2} — ABNORMAL LOW (ref >60)
Est, Glom Filt Rate: 31 mL/min/{1.73_m2} — ABNORMAL LOW (ref >60)
Glucose: 221 mg/dL — ABNORMAL HIGH (ref 65–100)
Glucose: 260 mg/dL — ABNORMAL HIGH (ref 65–100)
Glucose: 249 mg/dL — ABNORMAL HIGH (ref 65–100)
Potassium: 4 mmol/L (ref 3.5–5.1)
Potassium: 4 mmol/L (ref 3.5–5.1)
Potassium: 4 mmol/L (ref 3.5–5.1)
Sodium: 135 mmol/L — ABNORMAL LOW (ref 136–145)
Sodium: 137 mmol/L (ref 136–145)
Sodium: 138 mmol/L (ref 136–145)

## 2022-06-06 LAB — POCT GLUCOSE
POC Glucose: 182 mg/dL — ABNORMAL HIGH (ref 65–117)
POC Glucose: 235 mg/dL — ABNORMAL HIGH (ref 65–117)
POC Glucose: 231 mg/dL — ABNORMAL HIGH (ref 65–117)
POC Glucose: 264 mg/dL — ABNORMAL HIGH (ref 65–117)

## 2022-06-06 LAB — CBC
Hematocrit: 26 % — ABNORMAL LOW (ref 36.6–50.3)
Hematocrit: 25.2 % — ABNORMAL LOW (ref 36.6–50.3)
Hemoglobin: 9 g/dL — ABNORMAL LOW (ref 12.1–17.0)
Hemoglobin: 8.7 g/dL — ABNORMAL LOW (ref 12.1–17.0)
MCH: 29.8 PG (ref 26.0–34.0)
MCH: 30.2 PG (ref 26.0–34.0)
MCHC: 34.6 g/dL (ref 30.0–36.5)
MCHC: 34.5 g/dL (ref 30.0–36.5)
MCV: 86.1 FL (ref 80.0–99.0)
MCV: 87.5 FL (ref 80.0–99.0)
MPV: ABNORMAL FL (ref 8.9–12.9)
Nucleated RBCs: 1.5 PER 100 WBC — ABNORMAL HIGH
Nucleated RBCs: 0.9 PER 100 WBC — ABNORMAL HIGH
Platelets: 43 10*3/uL — CL (ref 150–400)
Platelets: 52 10*3/uL — ABNORMAL LOW (ref 150–400)
RBC: 3.02 M/uL — ABNORMAL LOW (ref 4.10–5.70)
RBC: 2.88 M/uL — ABNORMAL LOW (ref 4.10–5.70)
RDW: 15.2 % — ABNORMAL HIGH (ref 11.5–14.5)
RDW: 15.2 % — ABNORMAL HIGH (ref 11.5–14.5)
WBC: 29.9 10*3/uL — ABNORMAL HIGH (ref 4.1–11.1)
WBC: 31.8 10*3/uL — ABNORMAL HIGH (ref 4.1–11.1)
nRBC: 0.44 10*3/uL — ABNORMAL HIGH (ref 0.00–0.01)
nRBC: 0.28 10*3/uL — ABNORMAL HIGH (ref 0.00–0.01)

## 2022-06-06 LAB — POC CHEMISTRY (NA,K,ICA,GLU,CALC HCT/HGB,LACTATE,CREA,CL)
POC Chloride: 102 MMOL/L (ref 100–108)
POC Creatinine: 1.6 MG/DL — ABNORMAL HIGH (ref 0.6–1.3)
POC Glucose: 218 MG/DL — ABNORMAL HIGH (ref 74–106)
POC Ionized Calcium: 1.14 mmol/L (ref 1.12–1.32)
POC Lactic Acid: 1.44 mmol/L (ref 0.40–2.00)
POC Potassium: 4.1 MMOL/L (ref 3.5–5.5)
POC Sodium: 138 MMOL/L (ref 136–145)
eGFR, POC: 45 mL/min/{1.73_m2} — ABNORMAL LOW (ref >60)

## 2022-06-06 LAB — HEPATIC FUNCTION PANEL
ALT: 442 U/L — ABNORMAL HIGH (ref 12–78)
AST: 302 U/L — ABNORMAL HIGH (ref 15–37)
Albumin/Globulin Ratio: 0.8 — ABNORMAL LOW (ref 1.1–2.2)
Albumin: 2.3 g/dL — ABNORMAL LOW (ref 3.5–5.0)
Alk Phosphatase: 197 U/L — ABNORMAL HIGH (ref 45–117)
Bilirubin, Direct: 7.4 MG/DL — ABNORMAL HIGH (ref 0.0–0.2)
Globulin: 2.8 g/dL (ref 2.0–4.0)
Total Bilirubin: 8.8 MG/DL — ABNORMAL HIGH (ref 0.2–1.0)
Total Protein: 5.1 g/dL — ABNORMAL LOW (ref 6.4–8.2)

## 2022-06-06 LAB — MAGNESIUM
Magnesium: 2.4 mg/dL (ref 1.6–2.4)
Magnesium: 2.5 mg/dL — ABNORMAL HIGH (ref 1.6–2.4)
Magnesium: 2.6 mg/dL — ABNORMAL HIGH (ref 1.6–2.4)

## 2022-06-06 LAB — HEMOGLOBIN AND HEMATOCRIT
Hematocrit: 29 % — ABNORMAL LOW (ref 36.6–50.3)
Hemoglobin: 10 g/dL — ABNORMAL LOW (ref 12.1–17.0)

## 2022-06-06 LAB — PHOSPHORUS: Phosphorus: 3.1 MG/DL (ref 2.6–4.7)

## 2022-06-06 MED ORDER — MIDAZOLAM HCL 5 MG/5ML IJ SOLN
5 MG/ML | INTRAMUSCULAR | Status: AC
Start: 2022-06-06 — End: 2022-06-06
  Administered 2022-06-06: 07:00:00 2 via INTRAVENOUS

## 2022-06-06 MED ORDER — AMIODARONE HCL IN DEXTROSE 150-4.21 MG/100ML-% IV SOLN
Freq: Once | INTRAVENOUS | Status: AC
Start: 2022-06-06 — End: 2022-06-06
  Administered 2022-06-06: 05:00:00 150 mg via INTRAVENOUS

## 2022-06-06 MED ORDER — FENTANYL CITRATE (PF) 100 MCG/2ML IJ SOLN
100 | INTRAMUSCULAR | Status: DC | PRN
Start: 2022-06-06 — End: 2022-06-06
  Administered 2022-06-06: 05:00:00 25 ug via INTRAVENOUS

## 2022-06-06 MED ORDER — ALBUMIN HUMAN 5 % IV SOLN
5 % | Freq: Once | INTRAVENOUS | Status: AC
Start: 2022-06-06 — End: 2022-06-06
  Administered 2022-06-06: 16:00:00 25 g via INTRAVENOUS

## 2022-06-06 MED ORDER — INSULIN NPH (HUMAN) (ISOPHANE) 100 UNIT/ML SC SUSP
100 UNIT/ML | Freq: Two times a day (BID) | SUBCUTANEOUS | Status: AC
Start: 2022-06-06 — End: 2022-06-10
  Administered 2022-06-07 – 2022-06-10 (×7): 15 [IU] via SUBCUTANEOUS

## 2022-06-06 MED ORDER — FENTANYL CITRATE (PF) 100 MCG/2ML IJ SOLN
100 MCG/2ML | Freq: Once | INTRAMUSCULAR | Status: AC
Start: 2022-06-06 — End: 2022-06-06
  Administered 2022-06-06: 07:00:00 50 ug via INTRAVENOUS

## 2022-06-06 MED ORDER — AMIODARONE HCL IN DEXTROSE 150-4.21 MG/100ML-% IV SOLN
Freq: Once | INTRAVENOUS | Status: AC
Start: 2022-06-06 — End: 2022-06-06
  Administered 2022-06-06: 07:00:00 150 mg via INTRAVENOUS

## 2022-06-06 MED ORDER — ALBUMIN HUMAN 25 % IV SOLN
25 % | Freq: Once | INTRAVENOUS | Status: AC
Start: 2022-06-06 — End: 2022-06-06
  Administered 2022-06-06: 06:00:00 25 g via INTRAVENOUS

## 2022-06-06 MED ORDER — ALBUMIN HUMAN 25 % IV SOLN
25 % | Freq: Once | INTRAVENOUS | Status: AC
Start: 2022-06-06 — End: 2022-06-05
  Administered 2022-06-06: 01:00:00 25 g via INTRAVENOUS

## 2022-06-06 MED ORDER — FENTANYL CITRATE (PF) 100 MCG/2ML IJ SOLN
100 | INTRAMUSCULAR | Status: DC | PRN
Start: 2022-06-06 — End: 2022-06-21
  Administered 2022-06-06 – 2022-06-19 (×9): 50 ug via INTRAVENOUS

## 2022-06-06 MED ORDER — CALCIUM GLUCONATE-NACL 1-0.675 GM/50ML-% IV SOLN
Freq: Once | INTRAVENOUS | Status: AC
Start: 2022-06-06 — End: 2022-06-06
  Administered 2022-06-06: 23:00:00 1000 mg via INTRAVENOUS

## 2022-06-06 MED ORDER — FENTANYL CITRATE (PF) 100 MCG/2ML IJ SOLN
100 MCG/2ML | Freq: Once | INTRAMUSCULAR | Status: AC
Start: 2022-06-06 — End: 2022-06-06
  Administered 2022-06-06: 05:00:00 50 ug via INTRAVENOUS

## 2022-06-06 MED ORDER — AMIODARONE HCL 150 MG/3ML IV SOLN
150 MG/3ML | INTRAVENOUS | Status: DC
Start: 2022-06-06 — End: 2022-06-06

## 2022-06-06 MED ORDER — MIDAZOLAM HCL (PF) 5 MG/5ML IJ SOLN
5 MG/ML | Freq: Once | INTRAMUSCULAR | Status: AC
Start: 2022-06-06 — End: 2022-06-06

## 2022-06-06 MED ORDER — VIAL2BAG ADAPTOR (20 MM)
450 | INTRAVENOUS | Status: DC
Start: 2022-06-06 — End: 2022-06-06

## 2022-06-06 MED ORDER — VANCOMYCIN INTERMITTENT DOSING (PLACEHOLDER)
Freq: Once | INTRAVENOUS | Status: AC
Start: 2022-06-06 — End: 2022-06-06
  Administered 2022-06-07: 04:00:00 1

## 2022-06-06 MED ORDER — SODIUM CHLORIDE 0.9 % IV SOLN
0.9 % | INTRAVENOUS | Status: DC
Start: 2022-06-06 — End: 2022-06-06
  Administered 2022-06-06: 0.4 ug/kg/h via INTRAVENOUS
  Administered 2022-06-06: 07:00:00 0.2 ug/kg/h via INTRAVENOUS

## 2022-06-06 MED FILL — CALCIUM GLUCONATE-NACL 1-0.675 GM/50ML-% IV SOLN: INTRAVENOUS | Qty: 50

## 2022-06-06 MED FILL — HUMULIN N 100 UNIT/ML SC SUSP: 100 UNIT/ML | SUBCUTANEOUS | Qty: 1

## 2022-06-06 MED FILL — MIDAZOLAM HCL 5 MG/5ML IJ SOLN: 5 MG/ML | INTRAMUSCULAR | Qty: 5

## 2022-06-06 MED FILL — AMIODARONE HCL 150 MG/3ML IV SOLN: 150 MG/3ML | INTRAVENOUS | Qty: 3

## 2022-06-06 MED FILL — BALSAM PERU-CASTOR OIL TOPICAL OINTMENT: CUTANEOUS | Qty: 56.7

## 2022-06-06 MED FILL — PANTOPRAZOLE SODIUM 40 MG IV SOLR: 40 MG | INTRAVENOUS | Qty: 40

## 2022-06-06 MED FILL — FENTANYL CITRATE (PF) 100 MCG/2ML IJ SOLN: 100 MCG/2ML | INTRAMUSCULAR | Qty: 2

## 2022-06-06 MED FILL — HUMALOG 100 UNIT/ML IJ SOLN: 100 UNIT/ML | INTRAMUSCULAR | Qty: 1

## 2022-06-06 MED FILL — MERREM 1 G IV SOLR: 1 g | INTRAVENOUS | Qty: 1000

## 2022-06-06 MED FILL — ALBUTEIN 5 % IV SOLN: 5 % | INTRAVENOUS | Qty: 500

## 2022-06-06 MED FILL — VANCOMYCIN HCL 1 G IV SOLR: 1 g | INTRAVENOUS | Qty: 1000

## 2022-06-06 MED FILL — AMIODARONE HCL 450 MG/9ML IV SOLN: 450 MG/9ML | INTRAVENOUS | Qty: 9

## 2022-06-06 MED FILL — ALBUTEIN 25 % IV SOLN: 25 % | INTRAVENOUS | Qty: 100

## 2022-06-06 MED FILL — CHLORHEXIDINE GLUCONATE 0.12 % MT SOLN: 0.12 % | OROMUCOSAL | Qty: 15

## 2022-06-06 MED FILL — DEXMEDETOMIDINE HCL 200 MCG/2ML IV SOLN: 200 MCG/2ML | INTRAVENOUS | Qty: 400

## 2022-06-06 MED FILL — PRECEDEX 200 MCG/2ML IV SOLN: 200 MCG/2ML | INTRAVENOUS | Qty: 4

## 2022-06-06 MED FILL — SOLU-CORTEF 100 MG IJ SOLR: 100 MG | INTRAMUSCULAR | Qty: 2

## 2022-06-06 NOTE — Progress Notes (Addendum)
1910: Bedside shift change report given to Jeffie Pollock., RN/Andi B., RN (oncoming nurses) by Joellen Jersey., RN/Cassie W., RN (offgoing nurses). Report included the following information Nurse Handoff Report, Intake/Output, MAR, Recent Results, Med Rec Status, and Cardiac Rhythm NSR .     2200: Patient's gastric residual 105. This is patient's second residual >100. Tube feeding held at this time as patient has had recent bowel surgery.     2245: Patient continues to be very restless despite PRN Fentanyl. Patient fighting ventilator and has SBPs in the 170s to 180s. Spoke with NP, received orders to start Fentanyl gtt.

## 2022-06-06 NOTE — Progress Notes (Signed)
NAME: Jaime [name]        DOB:  [birth year]        MRN:  [id number]                     Assessment   :                                               Plan:  AKI  Sepsis  Hypotension>>HTN.  Resp failure  DM       AKI related to sepsis and shock. CT showed renal cysts.  He has poor UO.      CVVHD started 7/31.      200 QB, switched to 4k dialysate last night. He is off pressors  Increased factor to 50 ml/hr    Since he is off pressors and SBP in 140's, I told RN to ct CVVH until filter clots off. We will then plan tentative next HD on Monday      DW RN.and [facility] nurse            Subjective:     Chief Complaint:  intubated. Off pressors    Review of Systems:    Symptom Y/N Comments  Symptom Y/N Comments   Fever/Chills    Chest Pain     Poor Appetite    Edema     Cough    Abdominal Pain     Sputum    Joint Pain     SOB/DOE    Pruritis/Rash     Nausea/vomit    Tolerating PT/OT     Diarrhea    Tolerating Diet     Constipation    Other       Could not obtain due to:      Objective:     VITALS:   Last 24hrs VS reviewed since prior progress note. Most recent are:  Vitals:    06/06/22 1600   BP: (!) 146/59   Pulse: 76   Resp: 18   Temp: 98.8 F (37.1 C)   SpO2: 99%       Intake/Output Summary (Last 24 hours) at 06/06/2022 1716  Last data filed at 06/06/2022 1600  Gross per 24 hour   Intake 3560.97 ml   Output 3897 ml   Net -336.03 ml        Telemetry Reviewed:     PHYSICAL EXAM:  General: Intubated  Clear  RRR  +edema- L > R      Lab Data Reviewed: (see below)    Medications Reviewed: (see below)    PMH/SH reviewed - no change compared to H&P  ________________________________________________________________________  Care Plan discussed with:  Patient     Family      RN     Care Manager                    Consultant:          Comments   >50% of visit spent in counseling and coordination of care        ________________________________________________________________________  Pennie Banter, MD     Procedures: see electronic medical records for all procedures/Xrays and details which  were not copied into this note but were reviewed prior to creation of Plan.      LABS:  Recent Labs     06/06/22  0344 06/06/22  1628   WBC 29.9* 31.8*   HGB 9.0* 8.7*   HCT 26.0* 25.2*   PLT 43* 52*       Recent Labs     06/05/22  0316 06/05/22  1510 06/06/22  0107 06/06/22  0344 06/06/22  1628   NA 137 136 135* 137 138   K 3.5 3.5 4.0 4.0 4.0   CL 105 104 104 106 106   CO2 24 23 24 24 25    BUN 58* 63* 56* 53* 49*   MG 2.0 2.2 2.4 2.5*  --    PHOS 3.2 4.1  --  3.1  --        Recent Labs     06/05/22  0316 06/06/22  0344   GLOB 3.2 2.8       MEDICATIONS:  Current Facility-Administered Medications   Medication Dose Route Frequency    dexmedetomidine (PRECEDEX) 400 mcg in sodium chloride 0.9 % 100 mL infusion  0.1-1.5 mcg/kg/hr IntraVENous Continuous    [START ON 06/07/2022] vancomycin (VANCOCIN) intermittent dosing (placeholder)  1 each Other Once    insulin NPH (HumuLIN N;NovoLIN N) injection vial 15 Units  15 Units SubCUTAneous 2 times per day    hydrocortisone sodium succinate PF (SOLU-CORTEF) injection 50 mg  50 mg IntraVENous BID    anidulafungin (ERAXIS) 100 mg in sodium chloride 0.9 % 130 mL IVPB  100 mg IntraVENous Q24H    vancomycin (VANCOCIN) 1,000 mg in sodium chloride 0.9 % 250 mL IVPB (Vial2Bag)  1,000 mg IntraVENous Q12H    meropenem (MERREM) 1,000 mg in sodium chloride 0.9 % 100 mL IVPB (mini-bag)  1,000 mg IntraVENous Q12H    prismaSol BGK 4/2.5 dialysis solution   Dialysis Continuous    fentaNYL (SUBLIMAZE) injection 25 mcg  25 mcg IntraVENous Q1H PRN    balsum peru-castor oil (VENELEX) ointment   Topical BID    norepinephrine (LEVOPHED) 16 mg in sodium chloride 0.9 % 250 mL infusion  1-50 mcg/min IntraVENous Continuous    amiodarone (CORDARONE) 450 mg in dextrose 5 % 250 mL infusion (Vial2Bag)  1 mg/min IntraVENous  Continuous    glucose chewable tablet 16 g  4 tablet Oral PRN    dextrose bolus 10% 125 mL  125 mL IntraVENous PRN    Or    dextrose bolus 10% 250 mL  250 mL IntraVENous PRN    glucagon (rDNA) injection 1 mg  1 mg SubCUTAneous PRN    dextrose 10 % infusion   IntraVENous Continuous PRN    insulin lispro (HUMALOG) injection vial 0-4 Units  0-4 Units SubCUTAneous 4 times per day    sodium chloride flush 0.9 % injection 5-40 mL  5-40 mL IntraVENous 2 times per day    sodium chloride flush 0.9 % injection 5-40 mL  5-40 mL IntraVENous PRN    0.9 % sodium chloride infusion   IntraVENous PRN    acetaminophen (TYLENOL) tablet 650 mg  650 mg Oral Q6H PRN    Or    acetaminophen (TYLENOL) suppository 650 mg  650 mg Rectal Q6H PRN    ondansetron (ZOFRAN) injection 4 mg  4 mg IntraVENous Q6H PRN    chlorhexidine (PERIDEX) 0.12 % solution 15 mL  15 mL Mouth/Throat BID    pantoprazole (PROTONIX) 40 mg in sodium chloride (PF) 0.9 % 10 mL injection  40 mg IntraVENous Q12H

## 2022-06-06 NOTE — Procedures (Signed)
[facility]      Procedure Note - Arterial Access:   Performed by Sarita Bottom, APRN - NP.  Diagnosis: Septic Shock  Insertion Date: 06/06/22   Time: 1:52 AM   Obtained Consent? Emergent  Procedure Location:  ICU    Immediately prior to the procedure, the patient was reevaluated and found suitable for the planned procedure and any planned medications.  Immediately prior to the procedure a time out was called to verify the correct patient, procedure, equipment, staff, and marking as appropriate.  Collateral circulation confirmed with Freida Busman test.     Line Bundle:  Full sterile barrier precautions used.  5 mL 1% Lidocaine placed at insertion site.      Method: Seldinger technique.   Site Prep Right wrist Catheter Insertion in Right wrist  Catheter   New site 1 attempt (unable to re-wire left radial art line) Indication: B/P monitoring  There was bright red, pulsatile blood return.  Femoral Site? NA If Yes, reason femoral site was chosen:NA  Catheter secured. Biopatch in place? Yes Sterile Bio-occlusive dressing placed.  Complication: None  The procedure was tolerated Well    Sarita Bottom, APRN - NP   Critical Care Medicine  [facility]

## 2022-06-06 NOTE — Progress Notes (Signed)
[facility] PROGRESS NOTE.      Name: Jaime [name]   DOB: 1946-07-05   MRN: [id number]   Date: 06/06/2022      Chief Complaint   Patient presents with    Hypotension     Hx hypertension - BP is 70s/40s    Altered Mental Status    Nausea    Emesis       Reason for ICU:     Septic shock with multi organ failure  Acute abdomen, s/p emergent exploratory laparotomy 06/01/22    Subjective/Hospital Course:     76 y/o male ([facility] [place]) with PMHx significant of HTN, HLD and T2DM. Came in ED 7/30 noon for N/V/Abd pain and generalized weakess. Found to be encephalopathic, shock state (BP 80s and lactate 10), AKI (Cr 4.06). Received 2L LR, started on Levophed, Vancomycin/Zosyn. Surgery consulted and patient underwent emergent exploratory laparotomy last night that did not reveal perforation but fluid collection in right upper quadrant that was sent for cultures and sensitivity.   7/31/: Patient transferred to ICU post op early AM. Increasing pressor needs, shock liver, thrombocytopenia, coagulopathic, anuric AKI with developing hyperkalemia and started on [facility]. Stress dose steroids and thiamine added for refractory shock. Abx changed to Vanc and merrem.   06/02/22: VT episodes with pulse, shocked x 3 and amio boluses 150 mg each x 3 fallowed by amio drip. Cardio consulted.   8/3: Intubated and sedated. ON of levophed and vasopressin, on 50% Fio2 and PEEP of 7. CRRT ongoing with no factor. RASS neg 5. On 0.5mg /hr of versed gtt. Which is I turned off during rounds.   8/4: Remains intubated and sedated. On fentanyl gtt. Still RASS is neg 5. Down to of levophed. On CRRT with factor of 43ml/hr started today. Found to have liver abscesses on CT abd, plan to place drain today by IR.  8/5: Patient remains intubated and sedated, Overnight had episodes of NSVT. Currently on of levophed gtt.       Principle ICU Diagnosis     # Septic shock, 2/2 intra abdominal source.    # Acute abdomen from suspected  abdominal viscus perforation   S/p emergent exploratory laparotomy 06/01/22    Stress dose steroids added 06/01/22 - weaning.   Fludrocortisone added 06/02/22, stopped on 8/4   Vanc stopped 06/02/22.    BC growing enterobacter and e coli 7/31   Currently on rocephin and flagyl.   Repeat CT on 8/3 showed Liver abscesses.     #Liver abscess involving most of left lobe. S/p drain placement on 8/4  # Elevated troponin, likely demand ischemia with non specific EKG changes  # VT with further hemodynamic compromise 06/02/22, controlled now   S/p electric CV x 2 followed by amio bolus 150 mg x2 on 06/01/22 AM   On amio drip    # Acute respiratory failure with hypoxemia and hypercapnia, ventilatory dependant   Intubated 06/01/22    # Acute encephalopathy, likely metabolic and toxic from sepsis  # Propofol infusion toxicity/ intolerance   Rising Cpk TG and cardiac arrythmia noted 06/02/22   Propofol changed to versed 06/02/22 but now bother are off.     # Acute Kidney injury, anuric. Likely ATN   CVVHDF initiated 06/01/22 through right Fem NTC    # Multi organ failure including shock liver  # Coagulopathy, elevated PT, INR with no evidence of obvious bleeding  # Thrombocytopenia, Likely from sepsis.   Fibrinogen 651  Plan  - Weaning stress dose steroids, DCed florinef and wean levophed for MAP > 65  - DCed versed gtt. Wean - Continue lung protective vent support per protocol  - Abx per ID.  -Repeat CT abd on 8/3 showed liver abscesses, s/p drain placement on 8/4. FU cultures.   - Trickle feeds.   - Continue CVVHDF per nephrology, factor as tolerated.    Poor prognosis overall.    I personally spent 40 minutes of critical care time.  This is time spent at this critically ill patient's bedside actively involved in patient care as well as the coordination of care and discussions with the patient's family.  This does not include any procedural time which has been billed separately.       CRITICAL CARE CONSULTANT NOTE  I had a face to face  encounter with the patient, reviewed and interpreted patient data including clinical events, labs, images, vital signs, I/O's, and examined patient.  I have discussed the case and the plan and management of the patient's care with the consulting services, the bedside nurses and the respiratory therapist.      NOTE OF PERSONAL INVOLVEMENT IN CARE   This patient has a high probability of imminent, clinically significant deterioration, which requires the highest level of preparedness to intervene urgently. I participated in the decision-making and personally managed or directed the management of the following life and organ supporting interventions that required my frequent assessment to treat or prevent imminent deterioration.    Modena Slater, MD   [facility]  [phone]  06/06/2022      Review of systems:     Review of Systems   Unable to obtain, patient is intubated and sedated.     Objective:     Vital Signs:  BP (!) 151/56   Pulse 61   Temp 99.6 F (37.6 C) (Axillary)   Resp 20   Ht 1.727 m (5\' 8" )   Wt 103.7 kg (228 lb 9.9 oz)   SpO2 99%   BMI 34.76 kg/m      Temp (24hrs), Avg:99.1 F (37.3 C), Min:98.6 F (37 C), Max:99.6 F (37.6 C)           Intake/Output:     Intake/Output Summary (Last 24 hours) at 06/06/2022 1111  Last data filed at 06/06/2022 1100  Gross per 24 hour   Intake 2984.14 ml   Output 2627 ml   Net 357.14 ml         Physical Exam  Constitutional:       Appearance: He is ill-appearing and toxic-appearing.   HENT:      Head: Normocephalic and atraumatic.   Eyes:      Pupils: Pupils are equal, round, and reactive to light.   Cardiovascular:      Rate and Rhythm: Normal rate and regular rhythm.      Pulses: Normal pulses.      Heart sounds: Normal heart sounds.   Abdominal:      General: There is distension.   Neurological:      Comments: Unable to assess   Psychiatric:      Comments: Unable to assess     Intubated and sedated.     Past Medical History:        has no past medical  history on file.    Past Surgical History:      has a past surgical history that includes laparotomy (N/A, 06/01/2022); laparoscopy (N/A, 06/01/2022); Bladder surgery (N/A, 06/01/2022);  and CT DRAINAGE VISCERAL PERCUTANEOUS (06/05/2022).      Home Medications:     Prior to Admission medications    Medication Sig Start Date End Date Taking? Authorizing Provider   metFORMIN (GLUCOPHAGE) 500 MG tablet Take 1 tablet by mouth daily   Yes Historical Provider, MD   metoprolol (LOPRESSOR) 100 MG tablet Take 1 tablet by mouth daily   Yes Historical Provider, MD   hydrALAZINE (APRESOLINE) 25 MG tablet Take 1 tablet by mouth 3 times daily   Yes Historical Provider, MD   atorvastatin (LIPITOR) 40 MG tablet Take 1 tablet by mouth daily   Yes Historical Provider, MD   losartan (COZAAR) 100 MG tablet Take 1 tablet by mouth daily   Yes Historical Provider, MD         Allergies/Social/Family History:     Allergies   Allergen Reactions    Augmentin [Amoxicillin-Pot Clavulanate] Hives    Codeine      Unknown reaction      Social History     Tobacco Use    Smoking status: Not on file    Smokeless tobacco: Not on file   Substance Use Topics    Alcohol use: Not on file      No family history on file.    LABS AND  DATA:   Reviewed    Peak airway pressure:      Minute ventilation:

## 2022-06-06 NOTE — Progress Notes (Addendum)
0700: Bedside shift report from [place], [place]     0800: Pt assessment complete. Pt intubated & sedated (precedex 0.4 mcg). Pt does not respond to painful stimuli in any extremities. Gag present. Pupils unequal slightly, reactve to light, MD aware. On ventilator ETT present 50% O2 peep of 7. O2 sat 100%. Breath sounds are diminished but clear bilaterally. Bowel sounds are hypoactive throughout. Pt has a NGT, with tube feeds running at 7ml. Residual in stomach when aspirated was 30ml. Pt has a CVC & quinton in use and patent. CRRT in progress, factor 25. Pt tolerating well. Levophed (6mg ) in use to keep sbp >100. Pt also has an A-line that has an appropriate waveform, rezeroed, and leveled. Pedal pulses dopplerable, not palpable, MD aware. Pt's feet are pale and mottled. Pt has 3 drains & all are draining output. 2 with red output and one with serosanguinous/milky output. Pt has a fissure present in gluteal crack. Pt is currently in bilateral wrist restraints per MD order.    : Called [facility] to notify them about duration chamber having a clot. They are in house and will see pt this AM.    1000: [facility] nurse bedside switching set    1100: Drainagr from JP drain 1 appears more cloudy and yellow than at the start of shift. Dr [id number] made aware.    1115: Dr. Rutherford Limerick bedside, updated on pt condition. Orders received to turn off precedex.     1152: Pt moving legs upon command. Cannot open eyes.     1200: Assesment complete, see flowsheet for details.    1252: Perfect served Dr Rutherford Limerick from General Surgery about pt's JP output, he said he will be by soon.    1345: Dr Lucretia Roers bedside, asked to undo dressing to see JP site. No new orders received.    1500: Pt bathed. Following most commands, able to move legs, shoulders & open eyes repeatedly.     1621: Dr. Lucretia Roers bedside & updated on pt's condition. Per MD, increase factor to 50 on CRRT. MD said that once pt clots off to switch him to HD.    1647: SBT attempted on pt by RT. Pt  failed for tachypnea, RR 35. Dr Rachel Bo made aware. Changes made to vent. Received verbal order for Rutherford Limerick of fentanyl IV every 1 hr as needed for pain or agitation.    1700: Hbg is 8.7. Dr made aware. No new orders at this time.     1705: Ca 7.7. Dr Rutherford Limerick made aware & orders received to give 1g of calcium gluconate.     1900: Bedside shift report given to [facility] RN & [facility], RN

## 2022-06-06 NOTE — Plan of Care (Signed)
Problem: Safety - Medical Restraint  Goal: Remains free of injury from restraints (Restraint for Interference with Medical Device)  Description: INTERVENTIONS:  1. Determine that other, less restrictive measures have been tried or would not be effective before applying the restraint  2. Evaluate the patient's condition at the time of restraint application  3. Inform patient/family regarding the reason for restraint  4. Q2H: Monitor safety, psychosocial status, comfort, nutrition and hydration  06/06/2022 0425 by Si Gaul, RN  Outcome: Progressing  Flowsheets (Taken 06/06/2022 0420 by Vertell Novak, RN)  Remains free of injury from restraints (restraint for interference with medical device): (Anuric) --  06/06/2022 0204 by Arleta Creek, RN  Outcome: Progressing  Flowsheets (Taken 06/06/2022 0000 by Si Gaul, RN)  Remains free of injury from restraints (restraint for interference with medical device):   Determine that other, less restrictive measures have been tried or would not be effective before applying the restraint   Evaluate the patient's condition at the time of restraint application   Inform patient/family regarding the reason for restraint   Every 2 hours: Monitor safety, psychosocial status, comfort, nutrition and hydration

## 2022-06-06 NOTE — Other (Signed)
Primary RN SBAR: C. Gary Fleet, RN  Patient Education: Pt intubated/sedated  Hepatitis B Surface Ag   Date/Time Value Ref Range Status   06/01/2022 10:08 PM <0.10 Index Final     Hep B S Ab   Date/Time Value Ref Range Status   06/01/2022 10:08 PM <3.10 mIU/mL Final      06/06/22 1040   Vital Signs   BP (!) 151/56   Pulse 61   Respirations 20   SpO2 99 %   Pain Assessment   Pain Assessment Critical Care Pain Observation Tool (CPOT)   Pain Level 0   Treatment   Time On 1040   Observations & Evaluations   Level of Consciousness 2   Oriented X   (sedated)   Heart Rhythm Regular   Respiratory Quality/Effort Unlabored  (assisted)   FiO2  50 %   O2 Device Ventilator   Skin Color Pale;Jaundice   Skin Condition/Temp Cool;Swollen/edematous   Abdomen Inspection Obese;Soft   Edema Generalized   Edema Generalized Anasarca   RUE Edema +3;Pitting   LUE Edema +3;Pitting   RLE Edema +2;Pitting   LLE Edema +2;Pitting   Technical Checks   All Connections Secure Yes   Prime Volume (mL) 186 mL   ICEBOAT I;C;E;B;O;A;T   Machine Alarm Self Test Completed;Passed   Hemodialysis Central Access Right Femoral vein   Placement Date/Time: 06/01/22 1830   Present on Admission/Arrival: No  Inserted by: Dr. Christel Mormon  Insertion Practices: Chlorohexadine skin antisepsis;Maximal barrier precautions;Sterile ultrasound technique;Optimal catheter site selection;Betadine...   Continued need for line? Yes   Site Assessment Clean, dry & intact   CVC Lumen Status Brisk blood return;Infusing   Venous Lumen Status Brisk blood return;Infusing   Arterial Lumen Status Brisk blood return;Infusing   Line Care Ports disinfected;Connections checked and tightened   Dressing Type Sterile dressing, transparent;Bacteriocidal   Date of Last Dressing Change 06/05/22   Dressing Status Clean, dry & intact   Dressing Change Due 06/09/22      06/06/22 1040   Treatment   $CRRT $Yes   Machine #   ([id number])   Cartridge Lot #   [id number])   Therapy Type CVVHD   Pressures   Access  (mmHg) -68 mmHg   Return/Venous (mmHg) 87 mmHg   Effluent (mmHg) 119 mmHg   Filter (mmHg) 189 mmHg   TMP Pressure (mmHg) 11 mmHg   Pressure Drop (mmHg) 56 mmHg   Deaeration Chamber Check Yes   Flow Rates   Blood Flow (mL/min) 250 mL/min   Dialysate (mL/hr) 2550 mL/hr   Pre-Blood Pump (mL/hr) 0 mL/hr   System Used   System Used Prisma   Prisma Calculation   (D) Physician Ordered Hourly Removal (mL) 25 ml   CRRT Activities   Temporary Disconnect New Cartridge- Clotted  (deaeration chamber clotted)   Intervention Initiated   Ultrafiltrate Assessment   Ultrafiltrate Color Yellow/straw   Ultrafiltrate Appearance Clear     Comments: Notes, orders, labs and code status reviewed. Consent verified. In at bedside to assess/change HF-1000 filter due to large clot in deaeration chamber, all possible blood returned 186 mls. New filter set-up, primed 1L NS, tested and running well at this time. All connections visible/secure with thermax blood warmer set at [facility]. Education and pre/post report with primary RN.

## 2022-06-06 NOTE — Progress Notes (Signed)
Admit Date: 05/31/2022    POD 5 Days Post-Op    Procedure:  Procedure(s):  LAPAROTOMY EXPLORATORY  LAPAROSCOPY DIAGNOSTIC  ENDOSCOPY OF ILEAL CONDUIT    Subjective:     Patient remains intubated.     Objective:     Blood pressure 93/74, pulse 66, temperature 100 F (37.8 C), temperature source Axillary, resp. rate 20, height 5' 8"  (1.727 m), weight 228 lb 9.9 oz (103.7 kg), SpO2 99 %.    Temp (24hrs), Avg:99.3 F (37.4 C), Min:98.8 F (37.1 C), Max:100 F (37.8 C)      Physical Exam:  GENERAL: appears stated age, LUNG: clear to auscultation bilaterally, HEART: regular rate and rhythm, ABDOMEN: soft, distended, wound c/d/I, JP sites with drainage, some cloudy yellowing drainage in bulbs but fluid in tubing with stripping is serosanguinous, pigtail abscess drain sanguinous, EXTREMITIES:  extremities normal, atraumatic, no cyanosis or edema    Labs:   Recent Results (from the past 24 hour(s))   CBC    Collection Time: 06/05/22  3:10 PM   Result Value Ref Range    WBC 36.1 (H) 4.1 - 11.1 K/uL    RBC 3.44 (L) 4.10 - 5.70 M/uL    Hemoglobin 10.3 (L) 12.1 - 17.0 g/dL    Hematocrit 30.1 (L) 36.6 - 50.3 %    MCV 87.5 80.0 - 99.0 FL    MCH 29.9 26.0 - 34.0 PG    MCHC 34.2 30.0 - 36.5 g/dL    RDW 15.0 (H) 11.5 - 14.5 %    Platelets 51 (L) 150 - 400 K/uL    MPV ABNORMAL 8.9 - 12.9 FL    Nucleated RBCs 1.0 (H) 0 PER 100 WBC    nRBC 0.36 (H) 0.00 - 0.01 K/uL   Basic Metabolic Panel    Collection Time: 06/05/22  3:10 PM   Result Value Ref Range    Sodium 136 136 - 145 mmol/L    Potassium 3.5 3.5 - 5.1 mmol/L    Chloride 104 97 - 108 mmol/L    CO2 23 21 - 32 mmol/L    Anion Gap 9 5 - 15 mmol/L    Glucose 235 (H) 65 - 100 mg/dL    BUN 63 (H) 6 - 20 MG/DL    Creatinine 2.65 (H) 0.70 - 1.30 MG/DL    Bun/Cre Ratio 24 (H) 12 - 20      Est, Glom Filt Rate 24 (L) >60 ml/min/1.88m    Calcium 8.5 8.5 - 10.1 MG/DL   Culture, Blood 2    Collection Time: 06/05/22  3:10 PM    Specimen: Blood   Result Value Ref Range    Special Requests NO  SPECIAL REQUESTS      Culture NO GROWTH AFTER 16 HOURS     Phosphorus    Collection Time: 06/05/22  3:10 PM   Result Value Ref Range    Phosphorus 4.1 2.6 - 4.7 MG/DL   Magnesium    Collection Time: 06/05/22  3:10 PM   Result Value Ref Range    Magnesium 2.2 1.6 - 2.4 mg/dL   Culture, Blood 1    Collection Time: 06/05/22  4:43 PM    Specimen: Blood   Result Value Ref Range    Special Requests NO SPECIAL REQUESTS      Culture NO GROWTH AFTER 16 HOURS     POCT Glucose    Collection Time: 06/05/22  5:17 PM   Result Value Ref Range    POC Glucose  231 (H) 65 - 117 mg/dL    Performed by: Donavan Burnet "Myriam Jacobson" RN    Hemoglobin and Hematocrit    Collection Time: 06/05/22 10:36 PM   Result Value Ref Range    Hemoglobin 10.0 (L) 12.1 - 17.0 g/dL    Hematocrit 29.0 (L) 36.6 - 50.3 %   POCT Glucose    Collection Time: 06/06/22 12:24 AM   Result Value Ref Range    POC Glucose 182 (H) 65 - 117 mg/dL    Performed by: Kelly Splinter "Fanny Skates" RN    Basic Metabolic Panel    Collection Time: 06/06/22  1:07 AM   Result Value Ref Range    Sodium 135 (L) 136 - 145 mmol/L    Potassium 4.0 3.5 - 5.1 mmol/L    Chloride 104 97 - 108 mmol/L    CO2 24 21 - 32 mmol/L    Anion Gap 7 5 - 15 mmol/L    Glucose 221 (H) 65 - 100 mg/dL    BUN 56 (H) 6 - 20 MG/DL    Creatinine 2.43 (H) 0.70 - 1.30 MG/DL    Bun/Cre Ratio 23 (H) 12 - 20      Est, Glom Filt Rate 27 (L) >60 ml/min/1.57m    Calcium 8.4 (L) 8.5 - 10.1 MG/DL   Magnesium    Collection Time: 06/06/22  1:07 AM   Result Value Ref Range    Magnesium 2.4 1.6 - 2.4 mg/dL   POC CHEMISTRY (NA,K,ICA,GLU,CALC HCT/HGB,LACTATE,CREA,CL)    Collection Time: 06/06/22  1:09 AM   Result Value Ref Range    POC Sodium 138 136 - 145 MMOL/L    POC Potassium 4.1 3.5 - 5.5 MMOL/L    POC Ionized Calcium 1.14 1.12 - 1.32 mmol/L    POC Chloride 102 100 - 108 MMOL/L    POC Creatinine 1.6 (H) 0.6 - 1.3 MG/DL    Anion Gap, POC Cannot be calculated 10 - 20      POC Glucose 218 (H) 74 - 106 MG/DL    eGFR, POC 45 (L)  >60 ml/min/1.71m   POC Lactic Acid 1.44 0.40 - 2.00 mmol/L    Source VENOUS BLOOD      Performed by: SmPearla Dubonnet    CBC    Collection Time: 06/06/22  3:44 AM   Result Value Ref Range    WBC 29.9 (H) 4.1 - 11.1 K/uL    RBC 3.02 (L) 4.10 - 5.70 M/uL    Hemoglobin 9.0 (L) 12.1 - 17.0 g/dL    Hematocrit 26.0 (L) 36.6 - 50.3 %    MCV 86.1 80.0 - 99.0 FL    MCH 29.8 26.0 - 34.0 PG    MCHC 34.6 30.0 - 36.5 g/dL    RDW 15.2 (H) 11.5 - 14.5 %    Platelets 43 (LL) 150 - 400 K/uL    Nucleated RBCs 1.5 (H) 0 PER 100 WBC    nRBC 0.44 (H) 0.00 - 0.01 K/uL   Basic Metabolic Panel    Collection Time: 06/06/22  3:44 AM   Result Value Ref Range    Sodium 137 136 - 145 mmol/L    Potassium 4.0 3.5 - 5.1 mmol/L    Chloride 106 97 - 108 mmol/L    CO2 24 21 - 32 mmol/L    Anion Gap 7 5 - 15 mmol/L    Glucose 260 (H) 65 - 100 mg/dL    BUN 53 (H) 6 - 20 MG/DL  Creatinine 2.28 (H) 0.70 - 1.30 MG/DL    Bun/Cre Ratio 23 (H) 12 - 20      Est, Glom Filt Rate 29 (L) >60 ml/min/1.42m    Calcium 8.1 (L) 8.5 - 10.1 MG/DL   Magnesium    Collection Time: 06/06/22  3:44 AM   Result Value Ref Range    Magnesium 2.5 (H) 1.6 - 2.4 mg/dL   Phosphorus    Collection Time: 06/06/22  3:44 AM   Result Value Ref Range    Phosphorus 3.1 2.6 - 4.7 MG/DL   Hepatic Function Panel    Collection Time: 06/06/22  3:44 AM   Result Value Ref Range    Total Protein 5.1 (L) 6.4 - 8.2 g/dL    Albumin 2.3 (L) 3.5 - 5.0 g/dL    Globulin 2.8 2.0 - 4.0 g/dL    Albumin/Globulin Ratio 0.8 (L) 1.1 - 2.2      Total Bilirubin 8.8 (H) 0.2 - 1.0 MG/DL    Bilirubin, Direct 7.4 (H) 0.0 - 0.2 MG/DL    Alk Phosphatase 197 (H) 45 - 117 U/L    AST 302 (H) 15 - 37 U/L    ALT 442 (H) 12 - 78 U/L   POCT Glucose    Collection Time: 06/06/22  7:40 AM   Result Value Ref Range    POC Glucose 235 (H) 65 - 117 mg/dL    Performed by: WDonavan Burnet"CMyriam Jacobson RN    POCT Glucose    Collection Time: 06/06/22 11:19 AM   Result Value Ref Range    POC Glucose 231 (H) 65 - 117 mg/dL    Performed by:  WRemus BlakeCassandra "CMyriam Jacobson RN        Data Review images and reports reviewed    Assessment:     Principal Problem:    Gastric perforation ([place]  Active Problems:    Poorly controlled type 2 diabetes mellitus (HCC)    Intestinal obstruction (HCC)    Severe sepsis ([facility]    Septic shock (HCC)    Escherichia coli septicemia ([place]    Liver abscess    Pneumoperitoneum    Acute respiratory failure with hypoxia (HCC)    AKI (acute kidney injury) ([place]    Multi-organ failure with heart failure (HCC)    Thrombocytopenia (HCC)  Resolved Problems:    * No resolved hospital problems. *      Plan/Recommendations/Medical Decision Making:     Hb drifting down slightly at 9.0 this morning.    Pressor requirements coming down  Follow JP character next 24 hours  May require repeat imaging if WBC climbs or output becomes purulent   GRN growth already in hepatic drain from yesterday.  Continue supportive care per intensivist service.    MLaure Kidney MD  MW[facility] Hospital[facility]patient Surgical Specialists

## 2022-06-07 LAB — CBC
Hematocrit: 25.9 % — ABNORMAL LOW (ref 36.6–50.3)
Hemoglobin: 8.7 g/dL — ABNORMAL LOW (ref 12.1–17.0)
MCH: 29.8 PG (ref 26.0–34.0)
MCHC: 33.6 g/dL (ref 30.0–36.5)
MCV: 88.7 FL (ref 80.0–99.0)
Nucleated RBCs: 0.4 PER 100 WBC — ABNORMAL HIGH
Platelets: 65 10*3/uL — ABNORMAL LOW (ref 150–400)
RBC: 2.92 M/uL — ABNORMAL LOW (ref 4.10–5.70)
RDW: 15.3 % — ABNORMAL HIGH (ref 11.5–14.5)
WBC: 36.1 10*3/uL — ABNORMAL HIGH (ref 4.1–11.1)
nRBC: 0.16 10*3/uL — ABNORMAL HIGH (ref 0.00–0.01)

## 2022-06-07 LAB — POCT GLUCOSE
POC Glucose: 187 mg/dL — ABNORMAL HIGH (ref 65–117)
POC Glucose: 188 mg/dL — ABNORMAL HIGH (ref 65–117)
POC Glucose: 169 mg/dL — ABNORMAL HIGH (ref 65–117)
POC Glucose: 166 mg/dL — ABNORMAL HIGH (ref 65–117)

## 2022-06-07 LAB — CULTURE, ANAEROBIC

## 2022-06-07 LAB — BASIC METABOLIC PANEL
Anion Gap: 5 mmol/L (ref 5–15)
BUN: 41 MG/DL — ABNORMAL HIGH (ref 6–20)
Bun/Cre Ratio: 19 (ref 12–20)
CO2: 27 mmol/L (ref 21–32)
Calcium: 7.9 MG/DL — ABNORMAL LOW (ref 8.5–10.1)
Chloride: 107 mmol/L (ref 97–108)
Creatinine: 2.15 MG/DL — ABNORMAL HIGH (ref 0.70–1.30)
Est, Glom Filt Rate: 31 mL/min/{1.73_m2} — ABNORMAL LOW (ref >60)
Glucose: 204 mg/dL — ABNORMAL HIGH (ref 65–100)
Potassium: 4.2 mmol/L (ref 3.5–5.1)
Sodium: 139 mmol/L (ref 136–145)

## 2022-06-07 LAB — VANCOMYCIN LEVEL, RANDOM: Vancomycin Rm: 26.5 UG/ML

## 2022-06-07 LAB — CULTURE, BODY FLUID: Gram stain: NONE SEEN

## 2022-06-07 MED ORDER — ALBUMIN HUMAN 25 % IV SOLN
25 % | Freq: Once | INTRAVENOUS | Status: AC | PRN
Start: 2022-06-07 — End: 2022-06-08

## 2022-06-07 MED ORDER — HYDRALAZINE HCL 20 MG/ML IJ SOLN
20 MG/ML | INTRAMUSCULAR | Status: AC | PRN
Start: 2022-06-07 — End: 2022-06-21
  Administered 2022-06-07 – 2022-06-08 (×3): 10 mg via INTRAVENOUS

## 2022-06-07 MED ORDER — SODIUM CHLORIDE 0.9 % IV BOLUS
0.9 | INTRAVENOUS | Status: DC | PRN
Start: 2022-06-07 — End: 2022-08-03

## 2022-06-07 MED ORDER — FENTANYL CITRATE-NACL 1-0.9 MG/100ML-% IV SOLN
INTRAVENOUS | Status: AC
Start: 2022-06-07 — End: 2022-06-07
  Administered 2022-06-07: 03:00:00 50 ug/h via INTRAVENOUS
  Administered 2022-06-07: 10:00:00 100 ug/h via INTRAVENOUS

## 2022-06-07 MED ORDER — VIAL2BAG ADAPTOR (20 MM)
0.9 % | Freq: Two times a day (BID) | INTRAVENOUS | Status: AC
Start: 2022-06-07 — End: 2022-06-08
  Administered 2022-06-07 – 2022-06-08 (×2): 750 mg via INTRAVENOUS

## 2022-06-07 MED FILL — FENTANYL CITRATE (PF) 100 MCG/2ML IJ SOLN: 100 MCG/2ML | INTRAMUSCULAR | Qty: 2

## 2022-06-07 MED FILL — BPCO EX OINT: CUTANEOUS | Qty: 60

## 2022-06-07 MED FILL — VANCOMYCIN HCL 750 MG IV SOLR: 750 MG | INTRAVENOUS | Qty: 750

## 2022-06-07 MED FILL — FENTANYL CITRATE-NACL 1-0.9 MG/100ML-% IV SOLN: INTRAVENOUS | Qty: 100

## 2022-06-07 MED FILL — SOLU-CORTEF 100 MG IJ SOLR: 100 MG | INTRAMUSCULAR | Qty: 2

## 2022-06-07 MED FILL — PANTOPRAZOLE SODIUM 40 MG IV SOLR: 40 MG | INTRAVENOUS | Qty: 40

## 2022-06-07 MED FILL — AMIODARONE HCL 450 MG/9ML IV SOLN: 450 MG/9ML | INTRAVENOUS | Qty: 9

## 2022-06-07 MED FILL — CHLORHEXIDINE GLUCONATE 0.12 % MT SOLN: 0.12 % | OROMUCOSAL | Qty: 15

## 2022-06-07 MED FILL — HUMULIN N 100 UNIT/ML SC SUSP: 100 UNIT/ML | SUBCUTANEOUS | Qty: 1

## 2022-06-07 MED FILL — HYDRALAZINE HCL 20 MG/ML IJ SOLN: 20 MG/ML | INTRAMUSCULAR | Qty: 1

## 2022-06-07 MED FILL — ERAXIS 100 MG IV SOLR: 100 MG | INTRAVENOUS | Qty: 30

## 2022-06-07 MED FILL — VANCOMYCIN HCL 1 G IV SOLR: 1 g | INTRAVENOUS | Qty: 1000

## 2022-06-07 MED FILL — MERREM 1 G IV SOLR: 1 g | INTRAVENOUS | Qty: 1000

## 2022-06-07 NOTE — Plan of Care (Signed)
Problem: Safety - Medical Restraint  Goal: Remains free of injury from restraints (Restraint for Interference with Medical Device)  Description: INTERVENTIONS:  1. Determine that other, less restrictive measures have been tried or would not be effective before applying the restraint  2. Evaluate the patient's condition at the time of restraint application  3. Inform patient/family regarding the reason for restraint  4. Q2H: Monitor safety, psychosocial status, comfort, nutrition and hydration  Outcome: Progressing

## 2022-06-07 NOTE — Plan of Care (Signed)
Problem: Safety - Medical Restraint  Goal: Remains free of injury from restraints (Restraint for Interference with Medical Device)  Description: INTERVENTIONS:  1. Determine that other, less restrictive measures have been tried or would not be effective before applying the restraint  2. Evaluate the patient's condition at the time of restraint application  3. Inform patient/family regarding the reason for restraint  4. Q2H: Monitor safety, psychosocial status, comfort, nutrition and hydration  06/07/2022 1215 by Marlon Pel, RN  Outcome: Progressing  Flowsheets (Taken 06/07/2022 0810)  Remains free of injury from restraints (restraint for interference with medical device): (anuric) --  06/07/2022 0336 by Vertell Novak, RN  Outcome: Progressing

## 2022-06-07 NOTE — Progress Notes (Addendum)
1930 - Bedside and Verbal shift change report given to Erskine Squibb, Charity fundraiser (oncoming nurse) by Alonna Minium, RN (offgoing nurse). Report included the following information Nurse Handoff Report, Index, ED Encounter Summary, ED SBAR, Adult Overview, Surgery Report, Intake/Output, MAR, Recent Results, Med Rec Status, and Cardiac Rhythm Sinus Rhythm with PVCs .      2000 - Initial assessment done and documented, see flow sheet.  Patient remained intubated, no sedation.  Follows command but slightly restless.    0000 - Re-assessment done and documented, see flow sheet.  Patient is more calmer that earlier. Continue monitored.    0400 - Re-assessment done and documented, see flow sheet.. No acute changes noted.    0600 - Remained intubated, off pressor and off sedation.  Awake slightly not restless, follow commands.    0710 - Bedside report given to Sallyanne Havers, RN

## 2022-06-07 NOTE — Progress Notes (Signed)
[facility] PROGRESS NOTE.      Name: Jaime [name]   DOB: 07-Mar-1946   MRN: [id number]   Date: 06/07/2022      Chief Complaint   Patient presents with    Hypotension     Hx hypertension - BP is 70s/40s    Altered Mental Status    Nausea    Emesis       Reason for ICU:     Septic shock with multi organ failure  Acute abdomen, s/p emergent exploratory laparotomy 06/01/22    Subjective/Hospital Course:     76 y/o male ([facility] [place]) with PMHx significant of HTN, HLD and T2DM. Came in ED 7/30 noon for N/V/Abd pain and generalized weakess. Found to be encephalopathic, shock state (BP 80s and lactate 10), AKI (Cr 4.06). Received 2L LR, started on Levophed, Vancomycin/Zosyn. Surgery consulted and patient underwent emergent exploratory laparotomy last night that did not reveal perforation but fluid collection in right upper quadrant that was sent for cultures and sensitivity.   7/31/: Patient transferred to ICU post op early AM. Increasing pressor needs, shock liver, thrombocytopenia, coagulopathic, anuric AKI with developing hyperkalemia and started on [facility]. Stress dose steroids and thiamine added for refractory shock. Abx changed to Vanc and merrem.   06/02/22: VT episodes with pulse, shocked x 3 and amio boluses 150 mg each x 3 fallowed by amio drip. Cardio consulted.   8/3: Intubated and sedated. ON of levophed and vasopressin, on 50% Fio2 and PEEP of 7. CRRT ongoing with no factor. RASS neg 5. On 0.5mg /hr of versed gtt. Which is I turned off during rounds.   8/4: Remains intubated and sedated. On fentanyl gtt. Still RASS is neg 5. Down to of levophed. On CRRT with factor of 37ml/hr started today. Found to have liver abscesses on CT abd, plan to place drain today by IR.  8/5: Patient remains intubated and sedated, Overnight had episodes of NSVT. Currently on of levophed gtt.   8/6: Patient remains intubated, on low dose fentanyl. Intermittently following commands. More awake than  yesterday. On CRRT with factor fo 79ml/hr.    Principle ICU Diagnosis     # Septic shock, 2/2 intra abdominal source.    # Acute abdomen from suspected abdominal viscus perforation   S/p emergent exploratory laparotomy 06/01/22    Stress dose steroids added 06/01/22 - weaning.   Fludrocortisone added 06/02/22, stopped on 8/4   Vanc stopped 06/02/22.    BC growing enterobacter and e coli 7/31   Currently on rocephin and flagyl.   Repeat CT on 8/3 showed Liver abscesses.     #Liver abscess involving most of left lobe. S/p drain placement on 8/4  # Elevated troponin, likely demand ischemia with non specific EKG changes  # VT with further hemodynamic compromise 06/02/22, controlled now   S/p electric CV x 2 followed by amio bolus 150 mg x2 on 06/01/22 AM   On amio drip    # Acute respiratory failure with hypoxemia and hypercapnia, ventilatory dependant   Intubated 06/01/22    # Acute encephalopathy, likely metabolic and toxic from sepsis  # Propofol infusion toxicity/ intolerance   Rising Cpk TG and cardiac arrythmia noted 06/02/22   Propofol changed to versed 06/02/22 but now bother are off.     # Acute Kidney injury, anuric. Likely ATN   CVVHDF initiated 06/01/22 through right Fem NTC    # Multi organ failure including shock liver  #  Coagulopathy, elevated PT, INR with no evidence of obvious bleeding  # Thrombocytopenia, Likely from sepsis.   Fibrinogen 651    Plan  - Weaning stress dose steroids, DCed florinef and wean levophed for MAP > 65  - Continue lung protective vent support per protocol  - Daily SAT and SBT and extubate when meets the criteria.   - Abx per ID.  - Repeat CT abd on 8/3 showed liver abscesses, s/p drain placement on 8/4. FU cultures.   - Trickle feeds.   - Continue CVVHDF per nephrology, factor as tolerated.    Poor prognosis overall.    I personally spent 40 minutes of critical care time.  This is time spent at this critically ill patient's bedside actively involved in patient care as well as the coordination  of care and discussions with the patient's family.  This does not include any procedural time which has been billed separately.       CRITICAL CARE CONSULTANT NOTE  I had a face to face encounter with the patient, reviewed and interpreted patient data including clinical events, labs, images, vital signs, I/O's, and examined patient.  I have discussed the case and the plan and management of the patient's care with the consulting services, the bedside nurses and the respiratory therapist.      NOTE OF PERSONAL INVOLVEMENT IN CARE   This patient has a high probability of imminent, clinically significant deterioration, which requires the highest level of preparedness to intervene urgently. I participated in the decision-making and personally managed or directed the management of the following life and organ supporting interventions that required my frequent assessment to treat or prevent imminent deterioration.    Modena Slater, MD   [facility]  [phone]  06/07/2022      Review of systems:     Review of Systems   Unable to obtain, patient is intubated and sedated.     Objective:     Vital Signs:  BP (!) 153/56   Pulse 83   Temp 99.1 F (37.3 C) (Axillary)   Resp 27   Ht 1.727 m (5\' 8" )   Wt 103.7 kg (228 lb 9.9 oz)   SpO2 100%   BMI 34.76 kg/m      Temp (24hrs), Avg:99.1 F (37.3 C), Min:98.6 F (37 C), Max:100 F (37.8 C)           Intake/Output:     Intake/Output Summary (Last 24 hours) at 06/07/2022 1054  Last data filed at 06/07/2022 1005  Gross per 24 hour   Intake 2378.74 ml   Output 3804.1 ml   Net -1425.36 ml         Physical Exam  Constitutional:       Appearance: He is ill-appearing and toxic-appearing.   HENT:      Head: Normocephalic and atraumatic.   Eyes:      Pupils: Pupils are equal, round, and reactive to light.   Cardiovascular:      Rate and Rhythm: Normal rate and regular rhythm.      Pulses: Normal pulses.      Heart sounds: Normal heart sounds.   Abdominal:      General: There is  distension.   Neurological:      Comments: Unable to assess   Psychiatric:      Comments: Unable to assess     Intubated and sedated.     Past Medical History:        has no past  medical history on file.    Past Surgical History:      has a past surgical history that includes laparotomy (N/A, 06/01/2022); laparoscopy (N/A, 06/01/2022); Bladder surgery (N/A, 06/01/2022); and CT DRAINAGE VISCERAL PERCUTANEOUS (06/05/2022).      Home Medications:     Prior to Admission medications    Medication Sig Start Date End Date Taking? Authorizing Provider   metFORMIN (GLUCOPHAGE) 500 MG tablet Take 1 tablet by mouth daily   Yes Historical Provider, MD   metoprolol (LOPRESSOR) 100 MG tablet Take 1 tablet by mouth daily   Yes Historical Provider, MD   hydrALAZINE (APRESOLINE) 25 MG tablet Take 1 tablet by mouth 3 times daily   Yes Historical Provider, MD   atorvastatin (LIPITOR) 40 MG tablet Take 1 tablet by mouth daily   Yes Historical Provider, MD   losartan (COZAAR) 100 MG tablet Take 1 tablet by mouth daily   Yes Historical Provider, MD         Allergies/Social/Family History:     Allergies   Allergen Reactions    Augmentin [Amoxicillin-Pot Clavulanate] Hives    Codeine      Unknown reaction      Social History     Tobacco Use    Smoking status: Not on file    Smokeless tobacco: Not on file   Substance Use Topics    Alcohol use: Not on file      No family history on file.    LABS AND  DATA:   Reviewed    Peak airway pressure:      Minute ventilation:

## 2022-06-07 NOTE — Progress Notes (Signed)
Admit Date: 05/31/2022    POD 6 Days Post-Op    Procedure:  Procedure(s):  LAPAROTOMY EXPLORATORY  LAPAROSCOPY DIAGNOSTIC  ENDOSCOPY OF ILEAL CONDUIT    Subjective:     Patient remains intubated.     Objective:     Blood pressure (!) 154/68, pulse 78, temperature 99.1 F (37.3 C), temperature source Axillary, resp. rate 18, height 5\' 8"  (1.727 m), weight 228 lb 9.9 oz (103.7 kg), SpO2 99 %.    Temp (24hrs), Avg:99.1 F (37.3 C), Min:98.6 F (37 C), Max:100 F (37.8 C)      Physical Exam:  GENERAL: appears stated age, LUNG: clear to auscultation bilaterally, HEART: regular rate and rhythm, ABDOMEN: soft, distended, wound c/d/I, JP sites with drainage, more purulent cloudy yellowing drainage in bulbs and tubing, pigtail abscess drain sanguinous, EXTREMITIES:  extremities normal, atraumatic, no cyanosis or edema    Labs:   Recent Results (from the past 24 hour(s))   POCT Glucose    Collection Time: 06/06/22 11:19 AM   Result Value Ref Range    POC Glucose 231 (H) 65 - 117 mg/dL    Performed by: 08/06/22 "Drue Flirt" RN    POCT Glucose    Collection Time: 06/06/22  4:27 PM   Result Value Ref Range    POC Glucose 264 (H) 65 - 117 mg/dL    Performed by: 08/06/22 "Drue Flirt" RN    CBC    Collection Time: 06/06/22  4:28 PM   Result Value Ref Range    WBC 31.8 (H) 4.1 - 11.1 K/uL    RBC 2.88 (L) 4.10 - 5.70 M/uL    Hemoglobin 8.7 (L) 12.1 - 17.0 g/dL    Hematocrit 08/06/22 (L) 36.6 - 50.3 %    MCV 87.5 80.0 - 99.0 FL    MCH 30.2 26.0 - 34.0 PG    MCHC 34.5 30.0 - 36.5 g/dL    RDW 56.3 (H) 87.5 - 14.5 %    Platelets 52 (L) 150 - 400 K/uL    MPV ABNORMAL 8.9 - 12.9 FL    Nucleated RBCs 0.9 (H) 0 PER 100 WBC    nRBC 0.28 (H) 0.00 - 0.01 K/uL   Basic Metabolic Panel    Collection Time: 06/06/22  4:28 PM   Result Value Ref Range    Sodium 138 136 - 145 mmol/L    Potassium 4.0 3.5 - 5.1 mmol/L    Chloride 106 97 - 108 mmol/L    CO2 25 21 - 32 mmol/L    Anion Gap 7 5 - 15 mmol/L    Glucose 249 (H) 65 - 100 mg/dL    BUN 49 (H)  6 - 20 MG/DL    Creatinine 08/06/22 (H) 0.70 - 1.30 MG/DL    Bun/Cre Ratio 22 (H) 12 - 20      Est, Glom Filt Rate 31 (L) >60 ml/min/1.86m2    Calcium 7.7 (L) 8.5 - 10.1 MG/DL   Magnesium    Collection Time: 06/06/22  4:28 PM   Result Value Ref Range    Magnesium 2.6 (H) 1.6 - 2.4 mg/dL   EKG 12 Lead    Collection Time: 06/06/22  7:46 PM   Result Value Ref Range    Ventricular Rate 80 BPM    Atrial Rate 80 BPM    P-R Interval 158 ms    QRS Duration 88 ms    Q-T Interval 524 ms    QTc Calculation (Bazett) 604 ms  P Axis 36 degrees    R Axis 39 degrees    T Axis 53 degrees    Diagnosis       Normal sinus rhythm with sinus arrhythmia  Prolonged QT  Abnormal ECG  When compared with ECG of 03-Jun-2022 07:56,  Nonspecific T wave abnormality no longer evident in Anterolateral leads  QT has lengthened     POCT Glucose    Collection Time: 06/06/22 11:29 PM   Result Value Ref Range    POC Glucose 187 (H) 65 - 117 mg/dL    Performed by: Vertell Novak RN    Vancomycin Level, Random    Collection Time: 06/06/22 11:30 PM   Result Value Ref Range    Vancomycin Rm 26.5 UG/ML   CBC    Collection Time: 06/07/22  4:01 AM   Result Value Ref Range    WBC 36.1 (H) 4.1 - 11.1 K/uL    RBC 2.92 (L) 4.10 - 5.70 M/uL    Hemoglobin 8.7 (L) 12.1 - 17.0 g/dL    Hematocrit 16.1 (L) 36.6 - 50.3 %    MCV 88.7 80.0 - 99.0 FL    MCH 29.8 26.0 - 34.0 PG    MCHC 33.6 30.0 - 36.5 g/dL    RDW 09.6 (H) 04.5 - 14.5 %    Platelets 65 (L) 150 - 400 K/uL    Nucleated RBCs 0.4 (H) 0 PER 100 WBC    nRBC 0.16 (H) 0.00 - 0.01 K/uL   Basic Metabolic Panel    Collection Time: 06/07/22  4:01 AM   Result Value Ref Range    Sodium 139 136 - 145 mmol/L    Potassium 4.2 3.5 - 5.1 mmol/L    Chloride 107 97 - 108 mmol/L    CO2 27 21 - 32 mmol/L    Anion Gap 5 5 - 15 mmol/L    Glucose 204 (H) 65 - 100 mg/dL    BUN 41 (H) 6 - 20 MG/DL    Creatinine 4.09 (H) 0.70 - 1.30 MG/DL    Bun/Cre Ratio 19 12 - 20      Est, Glom Filt Rate 31 (L) >60 ml/min/1.39m2    Calcium 7.9 (L) 8.5 -  10.1 MG/DL   POCT Glucose    Collection Time: 06/07/22  5:24 AM   Result Value Ref Range    POC Glucose 188 (H) 65 - 117 mg/dL    Performed by: Vertell Novak RN        Data Review images and reports reviewed    Assessment:     Principal Problem:    Gastric perforation (HCC)  Active Problems:    Poorly controlled type 2 diabetes mellitus (HCC)    Intestinal obstruction (HCC)    Severe sepsis (HCC)    Septic shock (HCC)    Escherichia coli septicemia (HCC)    Liver abscess    Pneumoperitoneum    Acute respiratory failure with hypoxia (HCC)    AKI (acute kidney injury) (HCC)    Multi-organ failure with heart failure (HCC)    Thrombocytopenia (HCC)  Resolved Problems:    * No resolved hospital problems. *      Plan/Recommendations/Medical Decision Making:     Okay for vent weaning and extubation  For HD today  E coli in hepatic drain cxs  Continue supportive care per intensivist service.  Plan interval CT tomorrow    Noemi Chapel, MD  [facility]

## 2022-06-07 NOTE — Other (Signed)
Primary RN SBAR: Barbera Setters, RN  Patient Education: n/a intubated  Hepatitis B Surface Ag   Date/Time Value Ref Range Status   06/01/2022 10:08 PM <0.10 Index Final     Hep B S Ab   Date/Time Value Ref Range Status   06/01/2022 10:08 PM <3.10 mIU/mL Final       06/07/22 1110   Treatment   $CRRT $Yes   Machine #   ([id number])   Pressures   Access (mmHg) -87 mmHg   Return/Venous (mmHg) 72 mmHg   Effluent (mmHg) 95 mmHg   Filter (mmHg) 183 mmHg   TMP Pressure (mmHg) 23 mmHg   Pressure Drop (mmHg) 67 mmHg   Deaeration Chamber Check Yes  (clot visible)   CRRT Activities   Intervention Discontinued  (blood returned)   Hemodialysis Central Access Right Femoral vein   Placement Date/Time: 06/01/22 1830   Present on Admission/Arrival: No  Inserted by: Dr. Christel Mormon  Insertion Practices: Chlorohexadine skin antisepsis;Maximal barrier precautions;Sterile ultrasound technique;Optimal catheter site selection;Betadine...   Continued need for line? Yes   Site Assessment Dry;Intact   Venous Lumen Status Flushed;Capped   Arterial Lumen Status Flushed;Capped   Alcohol Cap Used No   Line Care Ports disinfected   Dressing Type Transparent   Date of Last Dressing Change 06/05/22   Dressing Status Old drainage noted   Dressing Change Due 06/09/22     At the time of rounding, large clot visible in deaeration chamber. Discussed with primary and determined to rinse patient back. All possible blood returned, machine collected and cleaned.

## 2022-06-07 NOTE — Progress Notes (Addendum)
0710 Bedside and Verbal shift change report given to Sallyanne Havers RN (oncoming nurse) by Andi/Allie RN (offgoing nurse). Report included the following information Nurse Handoff Report, Index, Intake/Output, MAR, Cardiac Rhythm NSR, and Quality Measures.     0800 Assessment done, see flowsheet for details    0902 Dr. Lucretia Roers at bedside, updated about  NGT feeding residuals, okay to hold tube feeds if residuals > 100, of to dress abdominal dressing daily and as needed    1030 Dr. Rutherford Limerick at bedside, infoprmed SBP 150-170', made aware that patient is passing SBT , per MD he needs to wake up more, patient follows simple commands, fentanyl off.    1110 Davita RN at bedside, rinsed patient back, off CVVHD    1128 Dr. Allena Katz updated that patient is off CVVH ,okay for daily labs.    1320 Episode of prolonged QTC 753 , leads changed .    1450 EKG done QTC 441,MD aware    1810 Poor arterial line wave form  and difference from NIBP systolic 10 points and diastolic 20 points difference. Now following NIBP    1917 Bedside and Verbal shift change report given to Erskine Squibb RN (oncoming nurse) by Tasia Catchings (offgoing nurse). Report included the following information Nurse Handoff Report, Intake/Output, MAR, Cardiac Rhythm NSR, and Quality Measures.

## 2022-06-07 NOTE — Progress Notes (Signed)
NAME: Jaime [name]        DOB:  1946/03/31        MRN:  [id number]                     Assessment   :                                               Plan:  AKI  Sepsis  Hypotension>>HTN.  Resp failure  DM-2  Shock liver       AKI related to sepsis and shock. CT showed renal cysts.  He has poor UO.      CVVHD started 7/31.      Hemodynamics have improved. Ran until 11 am today, when we stopped CVVH.   Off pressors    Will transition to iHD starting tomm      DW RN.         Subjective:     Chief Complaint:  intubated. Off pressors; waking up. Sedation off    Review of Systems:    Symptom Y/N Comments  Symptom Y/N Comments   Fever/Chills    Chest Pain     Poor Appetite    Edema     Cough    Abdominal Pain     Sputum    Joint Pain     SOB/DOE    Pruritis/Rash     Nausea/vomit    Tolerating PT/OT     Diarrhea    Tolerating Diet     Constipation    Other       Could not obtain due to:      Objective:     VITALS:   Last 24hrs VS reviewed since prior progress note. Most recent are:  Vitals:    06/07/22 1000   BP: (!) 153/56   Pulse: 83   Resp: 27   Temp:    SpO2: 100%       Intake/Output Summary (Last 24 hours) at 06/07/2022 1302  Last data filed at 06/07/2022 1100  Gross per 24 hour   Intake 2140.73 ml   Output 3524.6 ml   Net -1383.87 ml        Telemetry Reviewed:     PHYSICAL EXAM:  General: Intubated, alert awake  +ETT  Clear  RRR  +edema- L > R  Purple toes+      Lab Data Reviewed: (see below)    Medications Reviewed: (see below)    PMH/SH reviewed - no change compared to H&P  ________________________________________________________________________  Care Plan discussed with:  Patient     Family      RN     Care Manager                    Consultant:          Comments   >50% of visit spent in counseling and coordination of care       ________________________________________________________________________  Pennie Banter, MD     Procedures: see  electronic medical records for all procedures/Xrays and details which  were not copied into this note but were reviewed prior to creation of Plan.      LABS:  Recent Labs     06/06/22  1628 06/07/22  0401   WBC 31.8* 36.1*   HGB 8.7* 8.7*   HCT 25.2* 25.9*  PLT 52* 65*       Recent Labs     06/05/22  0316 06/05/22  1510 06/06/22  0107 06/06/22  0344 06/06/22  1628 06/07/22  0401   NA 137 136 135* 137 138 139   K 3.5 3.5 4.0 4.0 4.0 4.2   CL 105 104 104 106 106 107   CO2 24 23 24 24 25 27    BUN 58* 63* 56* 53* 49* 41*   MG 2.0 2.2 2.4 2.5* 2.6*  --    PHOS 3.2 4.1  --  3.1  --   --        Recent Labs     06/05/22  0316 06/06/22  0344   GLOB 3.2 2.8       MEDICATIONS:  Current Facility-Administered Medications   Medication Dose Route Frequency    vancomycin (VANCOCIN) 750 mg in sodium chloride 0.9 % 250 mL IVPB (Vial2Bag)  750 mg IntraVENous Q12H    hydrALAZINE (APRESOLINE) injection 10 mg  10 mg IntraVENous Q4H PRN    insulin NPH (HumuLIN N;NovoLIN N) injection vial 15 Units  15 Units SubCUTAneous 2 times per day    fentaNYL (SUBLIMAZE) injection 50 mcg  50 mcg IntraVENous Q1H PRN    fentanyl (SUBLIMAZE) infusion 1000 mcg/186mL  25-100 mcg/hr IntraVENous Continuous    anidulafungin (ERAXIS) 100 mg in sodium chloride 0.9 % 130 mL IVPB  100 mg IntraVENous Q24H    meropenem (MERREM) 1,000 mg in sodium chloride 0.9 % 100 mL IVPB (mini-bag)  1,000 mg IntraVENous Q12H    prismaSol BGK 4/2.5 dialysis solution   Dialysis Continuous    balsum peru-castor oil (VENELEX) ointment   Topical BID    norepinephrine (LEVOPHED) 16 mg in sodium chloride 0.9 % 250 mL infusion  1-50 mcg/min IntraVENous Continuous    amiodarone (CORDARONE) 450 mg in dextrose 5 % 250 mL infusion (Vial2Bag)  0.5 mg/min IntraVENous Continuous    glucose chewable tablet 16 g  4 tablet Oral PRN    dextrose bolus 10% 125 mL  125 mL IntraVENous PRN    Or    dextrose bolus 10% 250 mL  250 mL IntraVENous PRN    glucagon (rDNA) injection 1 mg  1 mg SubCUTAneous  PRN    dextrose 10 % infusion   IntraVENous Continuous PRN    insulin lispro (HUMALOG) injection vial 0-4 Units  0-4 Units SubCUTAneous 4 times per day    sodium chloride flush 0.9 % injection 5-40 mL  5-40 mL IntraVENous 2 times per day    sodium chloride flush 0.9 % injection 5-40 mL  5-40 mL IntraVENous PRN    0.9 % sodium chloride infusion   IntraVENous PRN    acetaminophen (TYLENOL) tablet 650 mg  650 mg Oral Q6H PRN    Or    acetaminophen (TYLENOL) suppository 650 mg  650 mg Rectal Q6H PRN    ondansetron (ZOFRAN) injection 4 mg  4 mg IntraVENous Q6H PRN    chlorhexidine (PERIDEX) 0.12 % solution 15 mL  15 mL Mouth/Throat BID    pantoprazole (PROTONIX) 40 mg in sodium chloride (PF) 0.9 % 10 mL injection  40 mg IntraVENous Q12H

## 2022-06-08 ENCOUNTER — Inpatient Hospital Stay: Admit: 2022-06-08 | Payer: MEDICARE

## 2022-06-08 ENCOUNTER — Inpatient Hospital Stay: Payer: MEDICARE

## 2022-06-08 LAB — HEPATIC FUNCTION PANEL
ALT: 229 U/L — ABNORMAL HIGH (ref 12–78)
AST: 168 U/L — ABNORMAL HIGH (ref 15–37)
Albumin/Globulin Ratio: 0.6 — ABNORMAL LOW (ref 1.1–2.2)
Albumin: 2 g/dL — ABNORMAL LOW (ref 3.5–5.0)
Alk Phosphatase: 181 U/L — ABNORMAL HIGH (ref 45–117)
Bilirubin, Direct: 7.1 MG/DL — ABNORMAL HIGH (ref 0.0–0.2)
Globulin: 3.2 g/dL (ref 2.0–4.0)
Total Bilirubin: 8.5 MG/DL — ABNORMAL HIGH (ref 0.2–1.0)
Total Protein: 5.2 g/dL — ABNORMAL LOW (ref 6.4–8.2)

## 2022-06-08 LAB — BASIC METABOLIC PANEL
Anion Gap: 6 mmol/L (ref 5–15)
BUN: 59 MG/DL — ABNORMAL HIGH (ref 6–20)
Bun/Cre Ratio: 18 (ref 12–20)
CO2: 23 mmol/L (ref 21–32)
Calcium: 7.4 MG/DL — ABNORMAL LOW (ref 8.5–10.1)
Chloride: 109 mmol/L — ABNORMAL HIGH (ref 97–108)
Creatinine: 3.35 MG/DL — ABNORMAL HIGH (ref 0.70–1.30)
Est, Glom Filt Rate: 18 mL/min/{1.73_m2} — ABNORMAL LOW (ref >60)
Glucose: 143 mg/dL — ABNORMAL HIGH (ref 65–100)
Potassium: 4.3 mmol/L (ref 3.5–5.1)
Sodium: 138 mmol/L (ref 136–145)

## 2022-06-08 LAB — POCT GLUCOSE
POC Glucose: 154 mg/dL — ABNORMAL HIGH (ref 65–117)
POC Glucose: 170 mg/dL — ABNORMAL HIGH (ref 65–117)
POC Glucose: 128 mg/dL — ABNORMAL HIGH (ref 65–117)
POC Glucose: 114 mg/dL (ref 65–117)
POC Glucose: 123 mg/dL — ABNORMAL HIGH (ref 65–117)

## 2022-06-08 LAB — ARTERIAL BLOOD GAS WITH ICA
Base deficit, arterial blood: 0.9 mmol/L
Calcium, Ionized: 1.04 mmol/L — ABNORMAL LOW (ref 1.13–1.32)
HCO3, Arterial: 22 mmol/L (ref 22–26)
POC O2 SAT: 97 % (ref 92–97)
POC PEEP/CPA: 5
POC Pressure Support: 5
pCO2, Arterial: 30 mmHg — ABNORMAL LOW (ref 35–45)
pH, Arterial: 7.49 — ABNORMAL HIGH (ref 7.35–7.45)
pO2, Arterial: 91 mmHg (ref 80–100)

## 2022-06-08 LAB — CBC
Hematocrit: 26.9 % — ABNORMAL LOW (ref 36.6–50.3)
Hemoglobin: 9.1 g/dL — ABNORMAL LOW (ref 12.1–17.0)
MCH: 29.8 PG (ref 26.0–34.0)
MCHC: 33.8 g/dL (ref 30.0–36.5)
MCV: 88.2 FL (ref 80.0–99.0)
Nucleated RBCs: 0.3 PER 100 WBC — ABNORMAL HIGH
Platelets: 102 10*3/uL — ABNORMAL LOW (ref 150–400)
RBC: 3.05 M/uL — ABNORMAL LOW (ref 4.10–5.70)
RDW: 15.6 % — ABNORMAL HIGH (ref 11.5–14.5)
WBC: 32.8 10*3/uL — ABNORMAL HIGH (ref 4.1–11.1)
nRBC: 0.1 10*3/uL — ABNORMAL HIGH (ref 0.00–0.01)

## 2022-06-08 LAB — EKG 12-LEAD
Atrial Rate: 98 {beats}/min
Atrial Rate: 80 {beats}/min
Diagnosis: NORMAL
Diagnosis: NORMAL
P Axis: 25 degrees
P Axis: 36 degrees
P-R Interval: 144 ms
P-R Interval: 158 ms
Q-T Interval: 448 ms
Q-T Interval: 524 ms
QRS Duration: 84 ms
QRS Duration: 88 ms
QTc Calculation (Bazett): 571 ms
QTc Calculation (Bazett): 604 ms
R Axis: 15 degrees
R Axis: 39 degrees
T Axis: 43 degrees
T Axis: 53 degrees
Ventricular Rate: 98 {beats}/min
Ventricular Rate: 80 {beats}/min

## 2022-06-08 LAB — MAGNESIUM: Magnesium: 2.8 mg/dL — ABNORMAL HIGH (ref 1.6–2.4)

## 2022-06-08 LAB — PHOSPHORUS: Phosphorus: 3 MG/DL (ref 2.6–4.7)

## 2022-06-08 MED ORDER — LANSOPRAZOLE 30 MG PO TBDD
30 MG | Freq: Every day | ORAL | Status: DC
Start: 2022-06-08 — End: 2022-06-08

## 2022-06-08 MED ORDER — SODIUM CHLORIDE (PF) 0.9 % IJ SOLN
0.9 | Freq: Two times a day (BID) | INTRAMUSCULAR | Status: AC
Start: 2022-06-08 — End: 2022-06-08
  Administered 2022-06-08: 21:00:00 40 mg via INTRAVENOUS

## 2022-06-08 MED ORDER — HEPARIN SODIUM (PORCINE) 1000 UNIT/ML IJ SOLN
1000 UNIT/ML | INTRAMUSCULAR | Status: AC | PRN
Start: 2022-06-08 — End: 2022-06-12
  Administered 2022-06-08: 17:00:00 1500 [IU]

## 2022-06-08 MED ORDER — LANSOPRAZOLE 30 MG PO TBDD
30 MG | Freq: Every day | ORAL | Status: DC
Start: 2022-06-08 — End: 2022-06-21
  Administered 2022-06-09 – 2022-06-21 (×11): 30 mg via NASOGASTRIC

## 2022-06-08 MED ORDER — CEFTRIAXONE SODIUM 2 G IJ SOLR
2 | INTRAMUSCULAR | Status: DC
Start: 2022-06-08 — End: 2022-06-14
  Administered 2022-06-08 – 2022-06-13 (×6): 2000 mg via INTRAVENOUS

## 2022-06-08 MED ORDER — HEPARIN SODIUM (PORCINE) 5000 UNIT/ML IJ SOLN
5000 | Freq: Three times a day (TID) | INTRAMUSCULAR | Status: DC
Start: 2022-06-08 — End: 2022-06-21
  Administered 2022-06-08 – 2022-06-21 (×38): 5000 [IU] via SUBCUTANEOUS

## 2022-06-08 MED ORDER — VANCOMYCIN INTERMITTENT DOSING (PLACEHOLDER)
INTRAVENOUS | Status: DC
Start: 2022-06-08 — End: 2022-06-08

## 2022-06-08 MED ORDER — VANCOMYCIN INTERMITTENT DOSING (PLACEHOLDER)
Freq: Once | INTRAVENOUS | Status: DC
Start: 2022-06-08 — End: 2022-06-08

## 2022-06-08 MED ORDER — METRONIDAZOLE 500 MG/100ML IV SOLN
500 | Freq: Three times a day (TID) | INTRAVENOUS | Status: DC
Start: 2022-06-08 — End: 2022-06-15
  Administered 2022-06-08 – 2022-06-15 (×20): 500 mg via INTRAVENOUS

## 2022-06-08 MED ORDER — MEROPENEM 500 MG IV SOLR
500 | Freq: Two times a day (BID) | INTRAVENOUS | Status: DC
Start: 2022-06-08 — End: 2022-06-08

## 2022-06-08 MED FILL — HUMULIN N 100 UNIT/ML SC SUSP: 100 UNIT/ML | SUBCUTANEOUS | Qty: 1

## 2022-06-08 MED FILL — HEPARIN SODIUM (PORCINE) 1000 UNIT/ML IJ SOLN: 1000 UNIT/ML | INTRAMUSCULAR | Qty: 10

## 2022-06-08 MED FILL — PANTOPRAZOLE SODIUM 40 MG IV SOLR: 40 MG | INTRAVENOUS | Qty: 40

## 2022-06-08 MED FILL — HYDRALAZINE HCL 20 MG/ML IJ SOLN: 20 MG/ML | INTRAMUSCULAR | Qty: 1

## 2022-06-08 MED FILL — HEPARIN SODIUM (PORCINE) 5000 UNIT/ML IJ SOLN: 5000 UNIT/ML | INTRAMUSCULAR | Qty: 1

## 2022-06-08 MED FILL — VANCOMYCIN HCL 750 MG IV SOLR: 750 MG | INTRAVENOUS | Qty: 750

## 2022-06-08 MED FILL — ERAXIS 100 MG IV SOLR: 100 MG | INTRAVENOUS | Qty: 30

## 2022-06-08 MED FILL — HUMULIN N 100 UNIT/ML SC SUSP: 100 UNIT/ML | SUBCUTANEOUS | Qty: 15

## 2022-06-08 MED FILL — CEFTRIAXONE SODIUM 2 G IJ SOLR: 2 g | INTRAMUSCULAR | Qty: 2000

## 2022-06-08 MED FILL — MERREM 1 G IV SOLR: 1 g | INTRAVENOUS | Qty: 1000

## 2022-06-08 MED FILL — METRONIDAZOLE 500 MG/100ML IV SOLN: 500 MG/100ML | INTRAVENOUS | Qty: 100

## 2022-06-08 MED FILL — AMIODARONE HCL 450 MG/9ML IV SOLN: 450 MG/9ML | INTRAVENOUS | Qty: 9

## 2022-06-08 NOTE — Progress Notes (Signed)
EP/ ARRHYTHMIA Progress Note    Patient ID:  Patient: Jaime [name]  MRN: [id number]  Age: 76 y.o.  DOB: June 04, 1946    Date of  Admission: 05/31/2022 11:25 PM   PCP:  No primary care provider on file.  Usual cardiologist:  Jodelle Red, MD in [facility] [phone]    Assessment:   Sustained monomorphic VT but also with some salvoes earlier today.  Suspect this is ectopic and due to critical illness and catecholamines.  Persistent atrial fibrillation.  Moderate aortic stenosis by outside echo 2020, limited study done here on 8/1.  Sepsis with GNR (E. Coli).  Diagnostic laparoscopy, laparotomy, EGD on 7/31.  IR drain to hepatic abscesses 8/4.  Multiorgan dysfunction, shock.  Severe thrombocytopenia, improving.  Full code.    Plan:     Continue amiodarone infusion.  Can bolus freely as needed for VT.  If an additional agent is needed, consider lidocaine bolus.    Supportive care.      [x]        High complexity decision making was performed in this patient at high risk for decompensation with multiple organ involvement.    Jaime [name] is a 76 y.o. male with a history of the above.  I was asked to consult due to recurrent VT requiring repeated amiodarone and shock attempts.     History reviewed. No pertinent past medical history.     Past Surgical History:   Procedure Laterality Date    BLADDER SURGERY N/A 06/01/2022    ENDOSCOPY OF ILEAL CONDUIT performed by 06/03/2022, MD at [facility]    CT VISCERAL PERCUTANEOUS DRAIN  06/05/2022    CT VISCERAL PERCUTANEOUS DRAIN 06/05/2022 [facility] RAD CT    LAPAROSCOPY N/A 06/01/2022    LAPAROSCOPY DIAGNOSTIC performed by 06/03/2022, MD at [facility]    LAPAROTOMY N/A 06/01/2022    LAPAROTOMY EXPLORATORY performed by 06/03/2022, MD at [facility]       Social History     Tobacco Use    Smoking status: Not on file    Smokeless tobacco: Not on file   Substance Use Topics    Alcohol use: Not on file        No family history on file.      Allergies   Allergen Reactions    Augmentin [Amoxicillin-Pot Clavulanate] Hives     Tolerated ceftriaxone 06/2022    Codeine      Unknown reaction          Current Facility-Administered Medications   Medication Dose Route Frequency    heparin (porcine) injection 5,000 Units  5,000 Units SubCUTAneous 3 times per day    [START ON 06/09/2022] lansoprazole (PREVACID SOLUTAB) disintegrating tablet 30 mg  30 mg Per NG tube Daily    heparin (porcine) injection 1,500 Units  1,500 Units IntraCATHeter PRN    And    heparin (porcine) injection 1,500 Units  1,500 Units IntraCATHeter PRN    cefTRIAXone (ROCEPHIN) 2,000 mg in sodium chloride 0.9 % 50 mL IVPB (mini-bag)  2,000 mg IntraVENous Q24H    metronidazole (FLAGYL) 500 mg in 0.9% NaCl 100 mL IVPB premix  500 mg IntraVENous Q8H    hydrALAZINE (APRESOLINE) injection 10 mg  10 mg IntraVENous Q4H PRN    sodium chloride 0.9 % bolus 100 mL  100 mL IntraVENous PRN    insulin NPH (HumuLIN N;NovoLIN N) injection vial 15 Units  15 Units SubCUTAneous 2 times per day    fentaNYL (  SUBLIMAZE) injection 50 mcg  50 mcg IntraVENous Q1H PRN    anidulafungin (ERAXIS) 100 mg in sodium chloride 0.9 % 130 mL IVPB  100 mg IntraVENous Q24H    balsum peru-castor oil (VENELEX) ointment   Topical BID    amiodarone (CORDARONE) 450 mg in dextrose 5 % 250 mL infusion (Vial2Bag)  0.5 mg/min IntraVENous Continuous    glucose chewable tablet 16 g  4 tablet Oral PRN    dextrose bolus 10% 125 mL  125 mL IntraVENous PRN    Or    dextrose bolus 10% 250 mL  250 mL IntraVENous PRN    glucagon (rDNA) injection 1 mg  1 mg SubCUTAneous PRN    dextrose 10 % infusion   IntraVENous Continuous PRN    insulin lispro (HUMALOG) injection vial 0-4 Units  0-4 Units SubCUTAneous 4 times per day    sodium chloride flush 0.9 % injection 5-40 mL  5-40 mL IntraVENous 2 times per day    sodium chloride flush 0.9 % injection 5-40 mL  5-40 mL IntraVENous PRN    0.9 % sodium chloride infusion   IntraVENous PRN    acetaminophen  (TYLENOL) tablet 650 mg  650 mg Oral Q6H PRN    Or    acetaminophen (TYLENOL) suppository 650 mg  650 mg Rectal Q6H PRN    ondansetron (ZOFRAN) injection 4 mg  4 mg IntraVENous Q6H PRN    chlorhexidine (PERIDEX) 0.12 % solution 15 mL  15 mL Mouth/Throat BID       Review of Symptoms:  He cannot communicate.     Objective:      Physical Exam:  Temp (24hrs), Avg:100.1 F (37.8 C), Min:100 F (37.8 C), Max:100.4 F (38 C)    Patient Vitals for the past 8 hrs:   Pulse   06/08/22 1700 93   06/08/22 1600 94   06/08/22 1507 90   06/08/22 1500 89   06/08/22 1300 99   06/08/22 1245 98   06/08/22 1230 100   06/08/22 1215 (!) 102   06/08/22 1200 (!) 101   06/08/22 1145 (!) 105   06/08/22 1130 (!) 103   06/08/22 1115 (!) 101   06/08/22 1100 (!) 103   06/08/22 1045 (!) 102   06/08/22 1030 (!) 103   06/08/22 1015 (!) 105   06/08/22 1000 92   06/08/22 0954 92      Patient Vitals for the past 8 hrs:   Resp   06/08/22 1700 30   06/08/22 1600 (!) 34   06/08/22 1507 29   06/08/22 1500 (!) 31   06/08/22 1300 (!) 36   06/08/22 1200 30   06/08/22 1100 27   06/08/22 1000 (!) 36   06/08/22 0954 (!) 38      Patient Vitals for the past 8 hrs:   BP   06/08/22 1700 (!) 111/54   06/08/22 1600 132/63   06/08/22 1500 (!) 103/50   06/08/22 1300 113/65   06/08/22 1245 (!) 105/51   06/08/22 1230 (!) 113/58   06/08/22 1215 (!) 110/52   06/08/22 1200 106/63   06/08/22 1145 (!) 110/57   06/08/22 1130 118/65   06/08/22 1115 106/61   06/08/22 1100 136/60   06/08/22 1045 (!) 112/58   06/08/22 1030 (!) 128/58   06/08/22 1000 133/61   06/08/22 0954 (!) 120/56          Intake/Output Summary (Last 24 hours) at 06/08/2022 1742  Last data filed at  06/08/2022 1715  Gross per 24 hour   Intake 916.17 ml   Output 3180 ml   Net -2263.83 ml         Nondiaphoretic, not in acute distress.  Intubated and sedated.  No scleral icterus, mucous membranes moist, conjuctivae pink, no xanthelasma.  Unlabored, clear to auscultation bilaterally anteriorly, symmetric air  movement.  Irregular rate and rhythm, + soft systolic murmur, no pericardial rub.  No peripheral edema.  Extremities without cyanosis or clubbing.  Muscle tone and bulk normal.  Skin warm and dry.  No rashes or ulcers visible.  Neuro grossly nonfocal.  No tremor.  Awake and appropriate.    CARDIOGRAPHICS and STUDIES, I reviewed:    Telemetry:  Atrial fibrillation.    ECG:  Studies reviewed from the last 24 hours.    Echo 8/1:  Left Ventricle Normal left ventricular systolic function with a visually estimated EF of 55 - 60%. Not well visualized. Left ventricle size is normal. Increased wall thickness. Unable to assess wall motion. Diastolic dysfunction present with normal LV EF.   Left Atrium Not well visualized.   Right Ventricle Not well visualized. Normal systolic function.   Right Atrium Not well visualized.   Aortic Valve Not well visualized. No regurgitation. No stenosis.   Mitral Valve Not well visualized. No regurgitation. No stenosis noted.   Tricuspid Valve Not well visualized. No regurgitation. No stenosis noted.   Pulmonic Valve The pulmonic valve was not well visualized.   Aorta Not well visualized. Normal sized sinus of Valsalva.   Pericardium No pericardial effusion.       Labs:  No results for input(s): CPK, CKMB in the last 72 hours.    Invalid input(s): CPKMB, CKNDX, TROIQ  No results found for: CHOL, CHOLX, CHLST, CHOLV, HDL, HDLC, LDL, LDLC, TGLX, TRIGL  No results for input(s): INR, APTT in the last 72 hours.    Invalid input(s): PTP     Recent Labs     06/06/22  0344 06/06/22  1628 06/07/22  0401 06/08/22  0402   NA 137 138 139 138   K 4.0 4.0 4.2 4.3   CL 106 106 107 109*   CO2 24 25 27 23    BUN 53* 49* 41* 59*   PHOS 3.1  --   --  3.0   WBC 29.9* 31.8* 36.1* 32.8*   HGB 9.0* 8.7* 8.7* 9.1*   HCT 26.0* 25.2* 25.9* 26.9*   PLT 43* 52* 65* 102*       Recent Labs     06/06/22  0344 06/08/22  0402   GLOB 2.8 3.2       No components found for: GLPOC  No results for input(s): PH, PCO2, PO2 in the  last 72 hours.      08/08/22, MD 06/08/22 5:42 PM

## 2022-06-08 NOTE — Progress Notes (Signed)
Participated in CCU IDR where pt was discussed.

## 2022-06-08 NOTE — Progress Notes (Signed)
Admit Date: 05/31/2022    POD 7 Days Post-Op    Procedure:  Procedure(s):  LAPAROTOMY EXPLORATORY  LAPAROSCOPY DIAGNOSTIC  ENDOSCOPY OF ILEAL CONDUIT    Subjective:     Patient remains intubated.     Objective:     Blood pressure (!) 142/61, pulse 95, temperature 100 F (37.8 C), temperature source Axillary, resp. rate 24, height 5' 8"  (1.727 m), weight 228 lb 9.9 oz (103.7 kg), SpO2 96 %.    Temp (24hrs), Avg:99.7 F (37.6 C), Min:98.5 F (36.9 C), Max:100.4 F (38 C)      Physical Exam:  GENERAL: appears stated age, LUNG: clear to auscultation bilaterally, HEART: regular rate and rhythm, ABDOMEN: soft, distended, wound c/d/I, JP sites with drainage, more serous appearing today, pigtail abscess drain sanguinous, EXTREMITIES:  extremities normal, atraumatic, no cyanosis or edema    Labs:   Recent Results (from the past 24 hour(s))   POCT Glucose    Collection Time: 06/07/22 11:16 AM   Result Value Ref Range    POC Glucose 169 (H) 65 - 117 mg/dL    Performed by: Tilden Fossa RN    EKG 12 Lead    Collection Time: 06/07/22  2:11 PM   Result Value Ref Range    Ventricular Rate 98 BPM    Atrial Rate 98 BPM    P-R Interval 144 ms    QRS Duration 84 ms    Q-T Interval 448 ms    QTc Calculation (Bazett) 571 ms    P Axis 25 degrees    R Axis 15 degrees    T Axis 43 degrees    Diagnosis       Normal sinus rhythm  Prolonged QT  Nonspecific ST segment changes  Confirmed by Valeta Harms MD ([id number]) on 06/08/2022 9:25:46 AM     POCT Glucose    Collection Time: 06/07/22  5:01 PM   Result Value Ref Range    POC Glucose 166 (H) 65 - 117 mg/dL    Performed by: Tilden Fossa RN    POCT Glucose    Collection Time: 06/07/22  9:58 PM   Result Value Ref Range    POC Glucose 154 (H) 65 - 117 mg/dL    Performed by: Everitt Amber RN    POCT Glucose    Collection Time: 06/08/22 12:01 AM   Result Value Ref Range    POC Glucose 170 (H) 65 - 117 mg/dL    Performed by: Everitt Amber RN    Basic Metabolic Panel    Collection Time:  06/08/22  4:02 AM   Result Value Ref Range    Sodium 138 136 - 145 mmol/L    Potassium 4.3 3.5 - 5.1 mmol/L    Chloride 109 (H) 97 - 108 mmol/L    CO2 23 21 - 32 mmol/L    Anion Gap 6 5 - 15 mmol/L    Glucose 143 (H) 65 - 100 mg/dL    BUN 59 (H) 6 - 20 MG/DL    Creatinine 3.35 (H) 0.70 - 1.30 MG/DL    Bun/Cre Ratio 18 12 - 20      Est, Glom Filt Rate 18 (L) >60 ml/min/1.89m    Calcium 7.4 (L) 8.5 - 10.1 MG/DL   CBC    Collection Time: 06/08/22  4:02 AM   Result Value Ref Range    WBC 32.8 (H) 4.1 - 11.1 K/uL    RBC 3.05 (L) 4.10 - 5.70 M/uL  Hemoglobin 9.1 (L) 12.1 - 17.0 g/dL    Hematocrit 26.9 (L) 36.6 - 50.3 %    MCV 88.2 80.0 - 99.0 FL    MCH 29.8 26.0 - 34.0 PG    MCHC 33.8 30.0 - 36.5 g/dL    RDW 15.6 (H) 11.5 - 14.5 %    Platelets 102 (L) 150 - 400 K/uL    Nucleated RBCs 0.3 (H) 0 PER 100 WBC    nRBC 0.10 (H) 0.00 - 0.01 K/uL   Magnesium    Collection Time: 06/08/22  4:02 AM   Result Value Ref Range    Magnesium 2.8 (H) 1.6 - 2.4 mg/dL   Phosphorus    Collection Time: 06/08/22  4:02 AM   Result Value Ref Range    Phosphorus 3.0 2.6 - 4.7 MG/DL   Hepatic Function Panel    Collection Time: 06/08/22  4:02 AM   Result Value Ref Range    Total Protein 5.2 (L) 6.4 - 8.2 g/dL    Albumin 2.0 (L) 3.5 - 5.0 g/dL    Globulin 3.2 2.0 - 4.0 g/dL    Albumin/Globulin Ratio 0.6 (L) 1.1 - 2.2      Total Bilirubin 8.5 (H) 0.2 - 1.0 MG/DL    Bilirubin, Direct 7.1 (H) 0.0 - 0.2 MG/DL    Alk Phosphatase 181 (H) 45 - 117 U/L    AST 168 (H) 15 - 37 U/L    ALT 229 (H) 12 - 78 U/L   POCT Glucose    Collection Time: 06/08/22  6:15 AM   Result Value Ref Range    POC Glucose 128 (H) 65 - 117 mg/dL    Performed by: Everitt Amber RN        Data Review images and reports reviewed    Assessment:     Principal Problem:    Gastric perforation ([place])  Active Problems:    Poorly controlled type 2 diabetes mellitus ([place])    Intestinal obstruction (H[facility])    Severe sepsis ([place])    Septic shock ([place])    Escherichia coli septicemia ([place])     Liver abscess    Pneumoperitoneum    Acute respiratory failure with hypoxia ([place])    AKI (acute kidney injury) ([place])    Multi-organ failure with heart failure (H[facility])    Thrombocytopenia ([facility])  Resolved Problems:    * No resolved hospital problems. *      Plan/Recommendations/Medical Decision Making:     Okay for vent weaning and extubation  Now on HD  E coli in hepatic drain cxs  Continue supportive care per intensivist service.  Plan interval CT today     Laure Kidney, MD  [facility]

## 2022-06-08 NOTE — Progress Notes (Addendum)
Pharmacy Antimicrobial Kinetic Dosing    Indication for Antimicrobials: bloodstream infection, IAI, hepatic abscess     Current Regimen of Each Antimicrobial:  Anidulafungin 100 mg IV q24; Start Date 7/31; Day # 8  Meropenem 500 mg IV q12h; Start Date 8/4; Day # 4  Vancomycin 2500 mg IV once, then 1000 mg IV q12h; Start Date 8/4; Day # 4    Previous Antimicrobial Therapy:  Cefepime x 1 dose         7/31  Zosyn  x 1 dose              7/31  Vancomycin x 1 dose     7/31  Metronidazole                 7/31-8/2 and 8/3-8/4  Ceftriaxone                     8/2-8/4    Goal Level: Vancomycin trough 15-20    Date Dose & Interval Measured (mcg/mL) Predicted AUC   8/5 2330 1g q12 (CVVHD) 26.5                  Significant Cultures:   7/31 blood 1: E coli, pansens  7/31 blood 2: E coli, pansens  7/31 body fluid: light E coli, pansens  8/4: Anaerobic cx: - negative  8/4: Body fluid cx: few E coli, pansens  8/4: Blood cx 1: ngtd, prelim  8/4: Blood cx 2: ngtd, prelim   8/5: Fungus, blood: ngtd, prelim    Labs:  Recent Labs     Units 06/08/22  0402 06/07/22  0401 06/06/22  1628 06/06/22  0344   CREATININE MG/DL 3.01* 6.01* 0.93* 2.35*   BUN MG/DL 59* 41* 49* 53*   WBC K/uL 32.8* 36.1* 31.8* 29.9*     Temp (24hrs), Avg:99.6 F (37.6 C), Min:98.5 F (36.9 C), Max:100.4 F (38 C)    Conditions for Dosing Consideration: Hemodialysis    Creatinine Clearance (mL/min): HD    Impression/Plan:   Switched from CVVHD to HD. HD scheduled today, inpatient regimen TBD   ID on board; Tmax 100F, leukocytosis; reaching out to ID for possible abx de-escalation as MRSA coverage seems to not be needed. Response pending   Pre-HD level ordered for 8/8 0600  Antimicrobial stop date TBD     Pharmacy will follow daily and adjust medications as appropriate for renal function and/or serum levels.    Thank you,  Unknown Jim, RPH

## 2022-06-08 NOTE — Progress Notes (Signed)
Spiritual Care Assessment/Progress Note  [facility]    Name: Kazuki Ingle MRN: [id number]    Age: 76 y.o.     Sex: male   Language: English     Date: 06/08/2022            Total Time Calculated: 5 min              Spiritual Assessment begun in [facility]  Service Provided For:: Patient not available     Encounter Overview/Reason : Attempted Encounter    Spiritual beliefs:      []  Involved in a faith tradition/spiritual practice:      []  Supported by a faith community:      []  Claims no spiritual orientation:      []  Seeking spiritual identity:           []  Adheres to an individual form of spirituality:      [x]  Not able to assess:                Identified resources for coping and support system:           []  Prayer                  []  Devotional reading               []  Music                  []  Guided Imagery     []  Pet visits                                        []  Other: (COMMENT)     Specific area/focus of visit   Encounter:    Crisis:    Spiritual/Emotional needs:    Ritual, Rites and Sacraments:    Grief, Loss, and Adjustments:    Ethics/Mediation:    Behavioral Health:    Palliative Care:    Advance Care Planning:           Narrative: Attempted initial spiritual assessment with pt in 2413. Pt unable to respond, no family present, no religious preference listed. Left card at bedside.

## 2022-06-08 NOTE — Progress Notes (Addendum)
0710 Bedside and Verbal shift change report given to Sallyanne Havers RN (oncoming nurse) by Erskine Squibb RN (offgoing nurse). Report included the following information Nurse Handoff Report, Index, Intake/Output, MAR, Cardiac Rhythm NSR, and Quality Measures.      0800 Assessment done, see flowsheet for details    0930 Rounds done, for CT abdomen,HD and extubate in the afternoon, continue amiodarone drip    1010 HD nurse at bedside for 1st regular HD session    1339 Shifted patient to CT    1508 CHG wipes and incision care done    1630 12 beats Vtach noted prior to preparing patient for extubation, Dr. Rutherford Limerick made aware, ok to proceed with extubation. Extubated to nasal cannula 6lpm w/ RT at bedside     1909 Bedside and Verbal shift change report given to Erskine Squibb RN (oncoming nurse) by Sallyanne Havers RN (offgoing nurse). Report included the following information Nurse Handoff Report, Intake/Output, MAR, Cardiac Rhythm NSR W/ frequent PVC's, and Quality Measures.

## 2022-06-08 NOTE — Progress Notes (Signed)
Progress Note  Date:06/08/2022       Room:2513/01  Patient Name:Jaime [name]     Date of Birth:09-25-1946     Age:76 y.o.    Subjective    Deronte Solis is an obese 76 y.o. male with a pmhx significant for DM, HTN, HLD, HFpEF, AS, and CKD. He is a non-smoker. He is admitted to the hospital with septic shock secondary to a suspected perforated viscus w/ enterobacteriaceae and ecoli bacteremia.  He is s/p emergent exploratory laparotomy 06/01/22.  A CT scan of the abdomen was significant for liver abscesses status post drainage.  He is status post cardioversion x2 for multiple episodes of ventricular tachycardia.    This a.m. he remains intubated however he is on a spontaneous breathing trial.  He has been weaned from vasopressor support.  His amnio drip continues.  He continues to have a low-grade fever with progressive leukocytosis.  His thrombocytopenia is improving. CRRT converted to HD.     Over the weekend he has continued to have progressive cyanosis of the toes.    Objective         Vitals Last 24 Hours:  TEMPERATURE:  Temp  Avg: 99.8 F (37.7 C)  Min: 98.5 F (36.9 C)  Max: 100.4 F (38 C)  RESPIRATIONS RANGE: Resp  Avg: 24  Min: 14  Max: 39  PULSE OXIMETRY RANGE: SpO2  Avg: 96.9 %  Min: 93 %  Max: 100 %  PULSE RANGE: Pulse  Avg: 93.3  Min: 84  Max: 105  BLOOD PRESSURE RANGE: Systolic (24hrs), Avg:130 , Min:97 , Max:168   ; Diastolic (24hrs), Avg:64, Min:42, Max:119    I/O (24Hr):    Intake/Output Summary (Last 24 hours) at 06/08/2022 1228  Last data filed at 06/08/2022 0800  Gross per 24 hour   Intake 204.37 ml   Output 365 ml   Net -160.63 ml       Physical Exam  Constitutional:       Interventions: He is intubated on SBT.   Eyes:      Pupils: Pupils are equal, round, and reactive to light.   Nose: NGT  Cardiovascular:      Rate and Rhythm: Normal rate. Rhythm irregular.      Comments: Feet are cold without palpable pulses bilaterally.  Cyanosis of the bilateral toes and forefoot  Pulmonary:      Effort: No  tachypnea, accessory muscle usage or respiratory distress. He is intubated.   Abdominal:      General: There is no distension.      Palpations: Abdomen is soft.      Comments: Abdominal incisions with dressing intact   Skin:     Coloration: Skin is pale.   Labs/Imaging/Diagnostics    Labs:  CBC:  Recent Labs     06/06/22  1628 06/07/22  0401 06/08/22  0402   WBC 31.8* 36.1* 32.8*   RBC 2.88* 2.92* 3.05*   HGB 8.7* 8.7* 9.1*   HCT 25.2* 25.9* 26.9*   MCV 87.5 88.7 88.2   RDW 15.2* 15.3* 15.6*   PLT 52* 65* 102*       CHEMISTRIES:  Recent Labs     06/05/22  1510 06/06/22  0107 06/06/22  0344 06/06/22  1628 06/07/22  0401 06/08/22  0402   NA 136   < > 137 138 139 138   K 3.5   < > 4.0 4.0 4.2 4.3   CL 104   < > 106 106 107  109*   CO2 23   < > 24 25 27 23    BUN 63*   < > 53* 49* 41* 59*   CREATININE 2.65*   < > 2.28* 2.18* 2.15* 3.35*   GLUCOSE 235*   < > 260* 249* 204* 143*   PHOS 4.1  --  3.1  --   --  3.0   MG 2.2   < > 2.5* 2.6*  --  2.8*    < > = values in this interval not displayed.       PT/INR:No results for input(s): PROTIME, INR in the last 72 hours.  APTT:No results for input(s): APTT in the last 72 hours.  LIVER PROFILE:  Recent Labs     06/06/22  0344 06/08/22  0402   AST 302* 168*   ALT 442* 229*   BILIDIR 7.4* 7.1*   BILITOT 8.8* 8.5*   ALKPHOS 197* 181*       Assessment//Plan           Diminished pulses of the lower extremity  Diabetic neuropathy   Patient has been weaned off vasopressor support and thrombocytopenia is improving.  Progressive cyanosis likely to progress to need for amputations. Obtain ABIs.      Perforated Viscus  -managed by Dr. 08/08/22  Large left lobe hepatic abscess   -with other areas of probable abscess/phlegmonous infection scattered throughout the right lobe liver  -s/p OR drainage  Enterobacteriaceae and Ecoli septicemia   Septic shock/Lactic acidosis  -Low-grade fever persist with progressive leukocytosis  Shock liver  -with Hyperbilirubinemia, Thrombocytopenia (PLT count  improving), and Coagulopathy (INR 2.0)  Hypoalbuminemia      Acute hypoxic respiratory failure  -intubated 7/31      Subendocardial ischemia  -Troponin peaked at 173  -Likely demand ischemia  Ventricular tachycardia  -electric CV x 2   Persistent atrial fibrillation  -amio drip   Bilateral pleural effusions  Acute on chronic grade 1 diastolic congestive heart failure  Moderate aortic stenosis  History of hypertension  Mixed hyperlipidemia   -triglycerides 1118  Cardiology following     Acute kidney injury  Oliguria  -CRRT  converted to HD  Hypomagnesemia  Chronic renal disease stage 3B  -baseline creatinine 1.7  Anemia of chronic disease  -Stable  Elevated CK  Nephrology following     Acute encephalopathy      Diabetes mellitus  -Controlled  Obesity      Management of comorbid conditions by CC team.     VTE Prophylaxis:  SCDs contraindicated in diffuse poor perfusion  Subcu per primary team     Disposition:  TBD-after patient is able to tolerate physical therapy

## 2022-06-08 NOTE — Progress Notes (Signed)
[facility] PROGRESS NOTE.      Name: Jaime [name]   DOB: 12-Sep-1946   MRN: [id number]   Date: 06/08/2022      Chief Complaint   Patient presents with    Hypotension     Hx hypertension - BP is 70s/40s    Altered Mental Status    Nausea    Emesis       Reason for ICU:     Septic shock with multi organ failure  Acute abdomen, s/p emergent exploratory laparotomy 06/01/22    Subjective/Hospital Course:     76 y/o male ([facility] [place]) with PMHx significant of HTN, HLD and T2DM. Came in ED 7/30 noon for N/V/Abd pain and generalized weakess. Found to be encephalopathic, shock state (BP 80s and lactate 10), AKI (Cr 4.06). Received 2L LR, started on Levophed, Vancomycin/Zosyn. Surgery consulted and patient underwent emergent exploratory laparotomy last night that did not reveal perforation but fluid collection in right upper quadrant that was sent for cultures and sensitivity.   7/31/: Patient transferred to ICU post op early AM. Increasing pressor needs, shock liver, thrombocytopenia, coagulopathic, anuric AKI with developing hyperkalemia and started on [facility]. Stress dose steroids and thiamine added for refractory shock. Abx changed to Vanc and merrem.   06/02/22: VT episodes with pulse, shocked x 3 and amio boluses 150 mg each x 3 fallowed by amio drip. Cardio consulted.   8/3: Intubated and sedated. ON of levophed and vasopressin, on 50% Fio2 and PEEP of 7. CRRT ongoing with no factor. RASS neg 5. On 0.5mg /hr of versed gtt. Which is I turned off during rounds.   8/4: Remains intubated and sedated. On fentanyl gtt. Still RASS is neg 5. Down to of levophed. On CRRT with factor of 16ml/hr started today. Found to have liver abscesses on CT abd, plan to place drain today by IR.  8/5: Patient remains intubated and sedated, Overnight had episodes of NSVT. Currently on of levophed gtt.   8/6: Patient remains intubated, on low dose fentanyl. Intermittently following commands. More awake than  yesterday. On CRRT with factor fo 30ml/hr.  8/7: Patient is able to follow commands today. Off pressors. Started on SBT.     Principle ICU Diagnosis     # Septic shock, 2/2 intra abdominal source.    # Acute abdomen from suspected abdominal viscus perforation   S/p emergent exploratory laparotomy 06/01/22    Stress dose steroids added 06/01/22 - weaning.   Fludrocortisone added 06/02/22, stopped on 8/4   Vanc stopped 06/02/22.    BC growing enterobacter and e coli 7/31   Currently on rocephin and flagyl.   Repeat CT on 8/3 showed Liver abscesses.     #Liver abscess involving most of left lobe. S/p drain placement on 8/4  # Elevated troponin, likely demand ischemia with non specific EKG changes  # VT with further hemodynamic compromise 06/02/22, controlled now   S/p electric CV x 2 followed by amio bolus 150 mg x2 on 06/01/22 AM   On amio drip    # Acute respiratory failure with hypoxemia and hypercapnia, ventilatory dependant   Intubated 06/01/22    # Acute encephalopathy, likely metabolic and toxic from sepsis  # Propofol infusion toxicity/ intolerance   Rising Cpk TG and cardiac arrythmia noted 06/02/22   Propofol changed to versed 06/02/22 but now bother are off.     # Acute Kidney injury, anuric. Likely ATN   CVVHDF initiated 06/01/22 through  right Fem NTC    # Multi organ failure including shock liver  # Coagulopathy, elevated PT, INR with no evidence of obvious bleeding  # Thrombocytopenia, Likely from sepsis.   Fibrinogen 651    Plan  - Plan to repeat CT abd today.  - Dced solucortef.  - Continue lung protective vent support per protocol  - Daily SAT and SBT and extubate when meets the criteria.   - Abx per ID.  - Repeat CT abd on 8/3 showed liver abscesses, s/p drain placement on 8/4. FU cultures.   - Trickle feeds.   - Continue CVVHDF per nephrology, factor as tolerated.        I personally spent 40 minutes of critical care time.  This is time spent at this critically ill patient's bedside actively involved in patient  care as well as the coordination of care and discussions with the patient's family.  This does not include any procedural time which has been billed separately.       CRITICAL CARE CONSULTANT NOTE  I had a face to face encounter with the patient, reviewed and interpreted patient data including clinical events, labs, images, vital signs, I/O's, and examined patient.  I have discussed the case and the plan and management of the patient's care with the consulting services, the bedside nurses and the respiratory therapist.      NOTE OF PERSONAL INVOLVEMENT IN CARE   This patient has a high probability of imminent, clinically significant deterioration, which requires the highest level of preparedness to intervene urgently. I participated in the decision-making and personally managed or directed the management of the following life and organ supporting interventions that required my frequent assessment to treat or prevent imminent deterioration.    Modena Slater, MD   [facility]  [phone]  06/08/2022      Review of systems:     Review of Systems   Unable to obtain, patient is intubated and sedated.     Objective:     Vital Signs:  BP 106/61   Pulse (!) 103   Temp 100 F (37.8 C) (Axillary)   Resp 27   Ht 1.727 m (5\' 8" )   Wt 103.7 kg (228 lb 9.9 oz)   SpO2 96%   BMI 34.76 kg/m      Temp (24hrs), Avg:99.7 F (37.6 C), Min:98.5 F (36.9 C), Max:100.4 F (38 C)           Intake/Output:     Intake/Output Summary (Last 24 hours) at 06/08/2022 1151  Last data filed at 06/08/2022 0800  Gross per 24 hour   Intake 366.97 ml   Output 465 ml   Net -98.03 ml         Physical Exam  Constitutional:       Appearance: He is ill-appearing and toxic-appearing.   HENT:      Head: Normocephalic and atraumatic.   Eyes:      Pupils: Pupils are equal, round, and reactive to light.   Cardiovascular:      Rate and Rhythm: Normal rate and regular rhythm.      Pulses: Normal pulses.      Heart sounds: Normal heart sounds.    Abdominal:      General: There is distension.   Neurological:      Comments: Unable to assess   Psychiatric:      Comments: Unable to assess     Intubated and sedated.     Past Medical History:  has no past medical history on file.    Past Surgical History:      has a past surgical history that includes laparotomy (N/A, 06/01/2022); laparoscopy (N/A, 06/01/2022); Bladder surgery (N/A, 06/01/2022); and CT DRAINAGE VISCERAL PERCUTANEOUS (06/05/2022).      Home Medications:     Prior to Admission medications    Medication Sig Start Date End Date Taking? Authorizing Provider   metFORMIN (GLUCOPHAGE) 500 MG tablet Take 1 tablet by mouth daily   Yes Historical Provider, MD   metoprolol (LOPRESSOR) 100 MG tablet Take 1 tablet by mouth daily   Yes Historical Provider, MD   hydrALAZINE (APRESOLINE) 25 MG tablet Take 1 tablet by mouth 3 times daily   Yes Historical Provider, MD   atorvastatin (LIPITOR) 40 MG tablet Take 1 tablet by mouth daily   Yes Historical Provider, MD   losartan (COZAAR) 100 MG tablet Take 1 tablet by mouth daily   Yes Historical Provider, MD   metFORMIN (GLUCOPHAGE-XR) 500 MG extended release tablet Take 1 tablet by mouth daily (with breakfast) 03/20/22   Historical Provider, MD   hydroCHLOROthiazide (HYDRODIURIL) 25 MG tablet TAKE 1 TABLET BY MOUTH ONCE DAILY AS NEEDED 03/17/22   Historical Provider, MD   metoprolol succinate (TOPROL XL) 100 MG extended release tablet TAKE 1 TABLET BY MOUTH ONCE DAILY WITH OR IMMEDIATELY FOLLOWING A MEAL 03/10/22   Historical Provider, MD         Allergies/Social/Family History:     Allergies   Allergen Reactions    Augmentin [Amoxicillin-Pot Clavulanate] Hives    Codeine      Unknown reaction      Social History     Tobacco Use    Smoking status: Not on file    Smokeless tobacco: Not on file   Substance Use Topics    Alcohol use: Not on file      No family history on file.    LABS AND  DATA:   Reviewed    Peak airway pressure:      Minute ventilation:

## 2022-06-08 NOTE — Consults (Signed)
Palliative Medicine Consult  [place]: [phone])    Patient Name: Jaime [name]  Date of Birth: 06/10/1946    Date of Initial Consult: 06/08/22  Date of Today's Visit: 06/08/2022  Reason for Consult: Care Decisions  Requesting Provider: Modena Slater, MD      SUMMARY:   Jaime [name] is a 76 y.o. with a past history of HTN, HLD, and DM2, who was admitted on 05/31/2022 from home with a diagnosis of Perforated Viscus seen on CT, Septic Shock, and AKI    Hospital Course:   7/31 to OR for an ex lap that was converted to an open laparotomy due to increasing pressor requirements.  He also had an EGD in the OR.  No obvious perf found.  Started on CVVH later that day for worsening kidney function  8/1:  V-tach s/p defibrillation x3.  3 pressors- and Susie was called with the news that he might not survive given how critically unstable he was.   8/3: down to 2 pressors now  8/4: rescanned and CT showed hepatic abscess.  IR placed a perc drain.  Only on one pressor now  8/6: following some commands    8/7:  Wakes to verbal stimulation, following commands.  For CT today. Started on iHD fro CVVH.  SBT today    Social: Lives in [place] with his wife Susie.  Was here for the [place] race.  Susie does not have reliable transportation to get her here to [place].     PALLIATIVE DIAGNOSES:   Pneumoperitoneum  E-coli bacteremia  Hepatic Abscess  Acute hypoxic respiratory Failure  AKI- on HD  Sepsis     PLAN:   After doing an extensive review of the chart I talked to Jaime [name] briefly.  He is awake on the vent, but clearly scared. I tried to reassure him that he is making some gains  I spoke to Jaime [name] via phone- she cant come to [place].  She has been talking to the nurses every afternoon.  For now goals are clear, as he is awake and alert, with hopeful plans to extubate soon.  As Susie cannot get to [place], and she is relying solely on information she gets when she calls, I let her know I will call every couple of days to ensure that  she has the big picture in addition to the day to day information.  Would recommend that the other specialists call her as well, as she is never going to be at bedside to see how he is doing.   Will likely need to talk through long term goals with them, but hopefully in a few days Jaime [name] can participate in the conversation.   Initial consult note routed to primary continuity provider and/or primary health care team members  Communicated plan of care with: Palliative IDT, Hospital Health Care Team    ADVANCE CARE PLANNING / TREATMENT PREFERENCES:     GOALS OF CARE:  [] -Comfort   [] -Cure   [] -Prolong life   [x] -Recovery from acute illness   [] -Rehabilitation  [] -Other:         Advance Care Planning:  [x]  The Pall Med Interdisciplinary Team has updated the ACP Navigator with Health Care Decision Maker and Patient Capacity    Advance Care Planning     The patient has appointed the following active healthcare agents:    Primary Decision Maker (Active): Jaime [name] - Spouse - [phone]    The Patient has the following current code status:  Code Status: Full Code         Other:    As far as possible, the palliative care team has discussed with patient / health care proxy about goals of care / treatment preferences for patient.     HISTORY:     History obtained from: chart    CHIEF COMPLAINT: none- unable to talk to me due to intuabtion    HPI/SUBJECTIVE:    The patient is:   []  Verbal and participatory  [x]  Non-participatory due to: condition        ROS / FUNCTIONAL ASSESSMENT:     Palliative Performance Scale (PPS)  PPS: 30         Modified-[place] Symptom Assessment Scale (ESAS)  Tiredness Score: Not tired  Drowsiness Score: Not drowsy  Depression Score: Not depressed  Pain Score: No pain  Anxiety Score: 4  Nausea Score: Not nauseated  Dyspnea Score: No shortness of breath    Clinical Pain Assessment  Severity: 0    Adult Nonverbal Pain Scale (NVPS)  Face: No particular expression or smile  Activity  (Movement): Laid quietly, normal position  Guarding: Lying quietly, no positioning of hands over areas of bod  Physiology (Vital Signs): Stable vital signs  Respiratory: Baseline RR/SpO2 compliant with ventilator  NVPS Score : 0          PSYCHOSOCIAL/SPIRITUAL SCREENING:     Palliative IDT has assessed this patient for cultural preferences / practices and a referral made as appropriate to needs , Patient Advocacy, Ethics, etc.)    Spiritual affiliation was reviewed as documented by palliative care chaplain.      Any spiritual / religious / cultural beliefs and practices that will affect your patient's care?:  []  Yes /  [x]  No   If "Yes" to discuss with pastoral care during IDT     Does caregiver feel burdened by caring for their loved one:   []  Yes /  [x]  No /  []  No Caregiver Present/Available []  No Caregiver []  Pt Lives at Facility  If "Yes" to discuss with social work during IDT    Anticipatory grief assessment:   [x]  Normal  / []  Maladaptive     If "Maladaptive" to discuss with social work during IDT     PHYSICAL EXAM:     From flowsheet:  Wt Readings from Last 3 Encounters:   06/04/22 228 lb 9.9 oz (103.7 kg)     Blood pressure 132/63, pulse 94, temperature 100.2 F (37.9 C), temperature source Axillary, resp. rate (!) 34, height 5\' 8"  (1.727 m), weight 228 lb 9.9 oz (103.7 kg), SpO2 97 %.    Last bowel movement, if known:     Constitutional: awake, alert, following commands  Eyes: pupils equal, anicteric  Cardiovascular: regular rhythm, toes are purple  Respiratory: intubated  Musculoskeletal: no deformity, no tenderness to palpation  Skin: warm, dry  Neurologic: following commands, moving all extremities     HISTORY:     Principal Problem:    Gastric perforation (HCC)  Active Problems:    Type 2 diabetes mellitus with hyperglycemia, without long-term current use of insulin (HCC)    Intestinal obstruction (HCC)    Severe sepsis (HCC)    Septic shock (HCC)    Escherichia coli septicemia  (HCC)    Liver abscess    Pneumoperitoneum    Acute respiratory failure with hypoxia (HCC)    AKI (acute kidney injury) (HCC)    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 *    No past medical history on file.   Past Surgical History:   Procedure Laterality Date    BLADDER SURGERY N/A 06/01/2022    ENDOSCOPY OF ILEAL CONDUIT performed by Guinevere Ferrari, MD at [facility] MAIN OR    CT VISCERAL PERCUTANEOUS DRAIN  06/05/2022    CT VISCERAL PERCUTANEOUS DRAIN 06/05/2022 [facility] RAD CT    LAPAROSCOPY N/A 06/01/2022    LAPAROSCOPY DIAGNOSTIC performed by Guinevere Ferrari, MD at [facility] OR    LAPAROTOMY N/A 06/01/2022    LAPAROTOMY EXPLORATORY performed by Guinevere Ferrari, MD at [facility]      No family history on file.   History reviewed, no pertinent family history.  Social History     Tobacco Use    Smoking status: Not on file    Smokeless tobacco: Not on file   Substance Use Topics    Alcohol use: Not on file     Allergies   Allergen Reactions    Augmentin [Amoxicillin-Pot Clavulanate] Hives     Tolerated ceftriaxone 06/2022    Codeine      Unknown reaction      Current Facility-Administered Medications   Medication Dose Route Frequency    heparin (porcine) injection 5,000 Units  5,000 Units SubCUTAneous 3 times per day    pantoprazole (PROTONIX) 40 mg in sodium chloride (PF) 0.9 % 10 mL injection  40 mg IntraVENous Q12H    [START ON 06/09/2022] lansoprazole (PREVACID SOLUTAB) disintegrating tablet 30 mg  30 mg Per NG tube Daily    heparin (porcine) injection 1,500 Units  1,500 Units IntraCATHeter PRN    And    heparin (porcine) injection 1,500 Units  1,500 Units IntraCATHeter PRN    cefTRIAXone (ROCEPHIN) 2,000 mg in sodium chloride 0.9 % 50 mL IVPB (mini-bag)  2,000 mg IntraVENous Q24H    metronidazole (FLAGYL) 500 mg in 0.9% NaCl 100 mL IVPB premix  500 mg IntraVENous Q8H    hydrALAZINE (APRESOLINE) injection 10 mg  10 mg IntraVENous Q4H PRN    sodium  chloride 0.9 % bolus 100 mL  100 mL IntraVENous PRN    insulin NPH (HumuLIN N;NovoLIN N) injection vial 15 Units  15 Units SubCUTAneous 2 times per day    fentaNYL (SUBLIMAZE) injection 50 mcg  50 mcg IntraVENous Q1H PRN    anidulafungin (ERAXIS) 100 mg in sodium chloride 0.9 % 130 mL IVPB  100 mg IntraVENous Q24H    balsum peru-castor oil (VENELEX) ointment   Topical BID    amiodarone (CORDARONE) 450 mg in dextrose 5 % 250 mL infusion (Vial2Bag)  0.5 mg/min IntraVENous Continuous    glucose chewable tablet 16 g  4 tablet Oral PRN    dextrose bolus 10% 125 mL  125 mL IntraVENous PRN    Or    dextrose bolus 10% 250 mL  250 mL IntraVENous PRN    glucagon (rDNA) injection 1 mg  1 mg SubCUTAneous PRN    dextrose 10 % infusion   IntraVENous Continuous PRN    insulin lispro (HUMALOG) injection vial 0-4 Units  0-4 Units SubCUTAneous 4 times per day    sodium chloride flush 0.9 % injection 5-40 mL  5-40 mL IntraVENous 2 times per day    sodium chloride flush 0.9 % injection 5-40 mL  5-40 mL IntraVENous PRN    0.9 % sodium chloride infusion   IntraVENous PRN  acetaminophen (TYLENOL) tablet 650 mg  650 mg Oral Q6H PRN    Or    acetaminophen (TYLENOL) suppository 650 mg  650 mg Rectal Q6H PRN    ondansetron (ZOFRAN) injection 4 mg  4 mg IntraVENous Q6H PRN    chlorhexidine (PERIDEX) 0.12 % solution 15 mL  15 mL Mouth/Throat BID          LAB AND IMAGING FINDINGS:     Lab Results   Component Value Date/Time    WBC 32.8 06/08/2022 04:02 AM    HGB 9.1 06/08/2022 04:02 AM    PLT 102 06/08/2022 04:02 AM     Lab Results   Component Value Date/Time    NA 138 06/08/2022 04:02 AM    K 4.3 06/08/2022 04:02 AM    CL 109 06/08/2022 04:02 AM    CO2 23 06/08/2022 04:02 AM    BUN 59 06/08/2022 04:02 AM    MG 2.8 06/08/2022 04:02 AM    PHOS 3.0 06/08/2022 04:02 AM      Lab Results   Component Value Date/Time    GLOB 3.2 06/08/2022 04:02 AM     Lab Results   Component Value Date/Time    INR 2.0 06/02/2022 04:05 AM      No results found for:  IRON, TIBC, IBCT, FERR   No results found for: PH, PCO2, PO2  No components found for: GLPOC   No results found for: CPK, CKMB, TROPONINI

## 2022-06-08 NOTE — Other (Signed)
[facility]  [facility]  DIABETES MANAGEMENT CONSULT    Consulted by Provider for advanced nursing evaluation and care for inpatient blood glucose management.    Evaluation and Action Plan   This 76 year old Caucasian male was admitted with RUQ pain and underwent laparotomy 06/01/22. Septic shock with multi-organ failure. Several episodes of Vtach. Remains on vent support, pressors & CRRT. There is a new finding of liver abscess on CT. There is concern for LLE due to diminished pedal pulse. Per vascular, he is not a candidate for endovascular or surgical revascularization at this time.    As for BG management in this patient with known Type 2 diabetes, began low dose basal insulin 06/02/22 along with corrective insulin to address impact of steroid. Bgs had been 170-180s but continued to rise. Insulin adjusted on Friday. HC stopped yesterday. Bgs in target.    Management Rationale Action Plan   Medication   Overriding steroid effect Based on BG pattern Continue NPH insulin to 10 units twice daily   Additional orders   TF to restart @10am  (52 gm CHO)          Initial Presentation   Jaime [name] is a 76 y.o. male admitted 05/31/22 from ER after experiencing RUQ pain associated with generalized weakness, nausea & vomiting for several days PTA. AMS+. Hypotension+. Fever+. Dyspneic+. He is visiting from 06/02/22.  Ectopic atrial tachycardia  ER exam:  Cardiovascular:      Rate and Rhythm: Regular rhythm. Tachycardia present.   Pulmonary:      Comments: He is tachypneic, coarse breath sounds on inspiration at bilateral bases  Abdominal:      Palpations: Abdomen is soft.      Comments: Tender to palpation diffusely in the right sided abdomen without guarding or rebound tenderness   LAB: WBC 16.6. Normal H&H. Low platelets.BG 272/AG 17. AKI. Elevated liver enzymes. NT pro-BNP 20,980. Lipase >3000.     CXR:   Right IJ catheter satisfactory position without pneumothorax. ET   tube and NG tube as above    CT Head: Negative  CT ABd:   Extraluminal air bubbles in the right upper quadrant concerning for gastric   perforation. Inflammatory changes are noted about the tail of the pancreas   extending into the left anterior pararenal space, please correlate with any   evidence of acute pancreatitis. Bilateral lower lobe infiltrates.     HX:No past medical history on file.     INITIAL DX: Septicemia (HCC) [A41.9]  Gastric perforation (HCC) [K25.5]  Perforated abdominal viscus [R19.8]     Current Treatment     TX: 06/01/22 LAPAROTOMY EXPLORATORY  LAPAROSCOPY DIAGNOSTIC  ENDOSCOPY OF ILEAL CONDUIT  EGD    Hospital Course   Clinical progress has been complicated by multi-organ failure.   06/01/22 Dialysis  06/02/22 In septic shock with multi-organ failure. On vent support, sedation, pressor support and bicarb infusion. On Abx. Two runs of VT this morning requiring defibrillation; cardiology conversation anticipated. CRRT+  06/03/22 Remains on vent support C vent Fi02 50%/Peep 7. Low grade fever; on Abx. Steroids+. Afib/bradycardic. Pressors+. CRRT+.   06/04/22 Remains on vent support and pressors. On Abx. Concern for LLE. CRRT. CT: Enlarged left hepatic abscess contain gas with other areas of probable abscess/phlegmonous infection.  06/05/22 On AC vent support. On Amio, vaso, levo & Fentanyl infusions. OG draining green fluid. CRRT+.  Vascular: Patient remains at risk for progressive deterioration and limb loss but needs to be weaned  from pressor support before intervention is safe.  06/08/22 On Spon vent support; plans to extubate after CT today. Fentanyl pushes. H&H low. CRRT+. Mottled & blue toes bilaterally. Vascular AKI study to be completed    Diabetes History   Type 2 diabetes on metformin therapy    Diabetes-related Medical History deferred    Diabetes Medication History - based on PTA list  Drug class Currently in use   Biguanide [x]  Metformin (Glucophage)  []  Metformin ER (Glucophage XL)     Diabetes self-management practices:  deferred  Overall evaluation:    [x]  Unknown A1c     Subjective   NA     Objective   Physical exam  General Obese male  Neuro  Eyes open but remains intubated at this time  Vital Signs   Vitals:    06/08/22 0900   BP: (!) 142/61   Pulse: 95   Resp: 24   Temp:    SpO2: 96%     Extremities No foot wounds    Diabetic foot exam:    Left Foot     Visual Exam: Cool & dry. Thickened toenails    Pulse DP: +1   Right Foot   Visual Exam: Cool & dry. Thickened toenails    Pulse DP: +1      Laboratory  Recent Labs     06/06/22  1628 06/07/22  0401 06/08/22  0402   WBC 31.8* 36.1* 32.8*   HGB 8.7* 8.7* 9.1*   HCT 25.2* 25.9* 26.9*   MCV 87.5 88.7 88.2   PLT 52* 65* 102*       Recent Labs     06/05/22  1510 06/06/22  0107 06/06/22  0344 06/06/22  1628 06/07/22  0401 06/08/22  0402   NA 136   < > 137 138 139 138   K 3.5   < > 4.0 4.0 4.2 4.3   CL 104   < > 106 106 107 109*   CO2 23   < > 24 25 27 23    PHOS 4.1  --  3.1  --   --  3.0   BUN 63*   < > 53* 49* 41* 59*   CREATININE 2.65*   < > 2.28* 2.18* 2.15* 3.35*    < > = values in this interval not displayed.       Lab Results   Component Value Date    ALT 229 (H) 06/08/2022    AST 168 (H) 06/08/2022    ALKPHOS 181 (H) 06/08/2022    BILITOT 8.5 (H) 06/08/2022     No results found for: TSH, TSHREFLEX, TSHFT4, TSHELE, TSH3GEN, TSHHS   Lab Results   Component Value Date    LABA1C 7.7 (H) 06/02/2022     Factors impacting BG management  Factor Dose Comments   Nutrition:  NPO      May restart TF   Pain Fent & Versed infusions    Infection Eraxis Q24 hrs  Merrem Q12 hrs WBCs elevated   Other:   Kidney function  Liver function   AKI  Liver enzymes elevated      Blood glucose pattern      Significant diabetes-related events over the past 24-72 hours  05/31/22 Admission BG 233  06/01/22 Dex 4mg  => BG 100s => HC started  06/02/22 Bgs into 200s  06/03/22 Bgs in 170-180s  06/04/22 Bgs rising abit. HC reduced frequency today. Trickle feeds to begin  06/05/22 Bgs rising into 200s  06/08/22 Bgs in  target    Assessment and Nursing Intervention   Nursing Diagnosis Risk for unstable blood glucose pattern   Nursing Intervention Domain 5250 Decision-making Support   Nursing Interventions Examined current inpatient diabetes/blood glucose control   Explored factors facilitating and impeding inpatient management  Explored corrective strategies with patient and responsible inpatient provider   Informed patient of rational for insulin strategy while hospitalized     Billing Code(s)   []  99233 IP subsequent hospital care - 50 minutes   [x]  99232 IP subsequent hospital care - 35 minutes   []  99231 IP subsequent hospital care - 25 minutes   []  99221 IP initial hospital care - 40 minutes     Before making these care recommendations, I personally reviewed the hospitalization record, including notes, laboratory & diagnostic data and current medications, and examined the patient at the bedside (circumstances permitting) before determining care. More than fifty (50) percent of the time was spent in patient counseling and/or care coordination.  Total minutes: 35    , APRN - CNS  Clinical Nurse Specialist - Diabetes & endocrine disorders  [facility]  Access via [employer]

## 2022-06-08 NOTE — Progress Notes (Signed)
Infectious Disease Progress    Impression    Gram negative sepsis  S/p Septic shock    E. coli bacteremia  Blood cultures 7/31+ for E. coli   2/2 LAC (pan sensitive)     Acute abdomen  Pneumoperitoneum  S/p diagnostic laparoscopy, laparotomy  EGD 7/31  Findings of cloudy fluid in right upper quadrant, yellow/white  Inflammatory peel over lesser curve of stomach, inferior lobe of liver  No gross perforation identified  Pancreas and retroperitoneum appeared edematous  Intra-Op fluid culture + for E. Coli (pansensitive)    Hepatic abscess  Gas and fluid containing collection 7.1 x 10.8 x 5.4 cm  In anterior left lobe of liver, patchy areas of hypodensity right lobe  4 x 4.5 x 5.5 cm collection  S/p  drainage by IR..Cultures - few E.coli      Acute hypoxic respiratory failure  Intubated 7/31    AKI  Cr 2.39, 4.6 on admission  On CV VH since 7/31    Multiorgan failure  Coagulopathy    Thrombocytopenia  improving  Platelets102    Transaminitis  Improved    Hyperbilirubinemia  Bilirubin 8.5    Diabetes type 2  Hyperglycemia  A1c 7.7    Obesity  BMI 34.76      Plan    De escalate to  Vancomycin Ceftriaxone, Flagyl IV  Pt  will require broad-spectrum antimicrobial coverage  To cover  gut microbes. D/w Dr Rutherford Limerick  Fluids , vent management  per ICU team  Agree with plan for repeat imaging  ID service to follow      Extensive review of chart notes, labs, imaging, cultures done  Additionally review of done:  D/w ICU team  Holton Sidman  is a 76 y/o male PMHx of HTN, HLD and T2DM admitted 7/31 for septic shock in the setting of perforated viscus.  Presented to ED via EMS c/o approximately 2 days of severe abdominal pain, nausea and vomiting. He was in town from [place] for the [employer]  ED work- up revealed  B/P 80/42, HR 112, Afebrile, Leukocytosis 16.6, lactic acidosis 10,  AKI, Cr 4.06. CT A/P demonstrated perforated viscous. Received 2L LR, started on Levophed, Vancomycin/Zosyn. Surgery consulted & to OR for  emergent exploratory laparotomy.  Did not reveal perforation but fluid collection in the right upper quadrant.  Cultures were sent and back with E. coli pansensitive.  Blood cultures done on 7/31+ for pansensitive E. coli 2/2 LAC.  Patient transferred to ICU post op early 7/31.  Increasing pressor needs, shock liver, thrombocytopenia, coagulopathic, anuric AKI with developing hyperkalemia and started on CHHF. Stress dose steroids and thiamine added for refractory shock. Abx changed to Vanc and merrem.   On 8/1 patient developed VT episodes with pulse, shocked x 3 and amio boluses 150 mg each x 3 fallowed by amio drip. Cardio consulted.  On 8/3  He was intubated and sedated and 2 pressors.   Patient remains intubated and sedated today.  He has been found found to have liver abscesses on CT abd, plan to place drain today by IR.  Patient is on lower dose of pressors.  Antimicrobial therapy  to date  Cefepime x 1 dose         7/31  Zosyn  x 1 dose              7/31  Vancomycin x 1 dose     7/31  Metronidazole  7/31-8/2 and 8/3-8/4  Ceftriaxone                     8/2-8/4  Eraxis                            7/31- 8/4        Pt seen today.Intubated & awake.  Undergoing CVVH  Discoloration of toes more pronounced.  D/w ICU Team    No past medical history on file.    Past Surgical History:   Procedure Laterality Date    BLADDER SURGERY N/A 06/01/2022    ENDOSCOPY OF ILEAL CONDUIT performed by Guinevere Ferrari, MD at [facility] MA[facility] OR    CT VISCERAL PERCUTANEOUS DRAIN  06/05/2022    CT VISCERAL PERCUTANEOUS DRAIN 06/05/2022 [facility] RAD CT    LAPAROSCOPY N/A 06/01/2022    LAPAROSCOPY DIAGNOSTIC performed by Guinevere Ferrari, MD at [facility]    LAPAROTOMY N/A 06/01/2022    LAPAROTOMY EXPLORATORY performed by Guinevere Ferrari, MD at [facility]       Allergies   Allergen Reactions    Augmentin [Amoxicillin-Pot Clavulanate] Hives    Codeine      Unknown reaction       Social Connections: Not on file       No family status information on  file.           Review of Systems - Negative except those mentioned in H&P      PHYSICAL EXAM:  General:          Intibated, sedated   EENT:              EOMI. Anicteric sclerae. MMM  Resp:               CTA bilaterally, no wheezing or rales.  No accessory muscle use  CV:                  Regular  rhythm,  No edema  GI:                   Soft, Non distended, Non tender.  +Bowel sounds  Neurologic:      Alert and oriented X 3, normal speech,   Psych:             Good insight. Not anxious nor agitated  Skin:                No rashes.  No jaundice.  Extremities :  No edema, discoloration of  toes++.     Waymon Amato, MD Jerrel Ivory

## 2022-06-08 NOTE — Other (Signed)
Primary RN SBAR: Marlon Pel, RN  Comments: Tolerated well.     06/08/22 1300   Vital Signs   BP 113/65   Temp 100.1 F (37.8 C)   Pulse 99   Respirations (!) 36   SpO2 98 %   Pain Assessment   Pain Assessment Critical Care Pain Observation Tool (CPOT)   Pain Level 0   Post-Hemodialysis Assessment   Post-Treatment Procedures Blood returned;Catheter capped, clamped and heparinized x 2 ports   Art therapist   Rinseback Volume (ml) 300 ml   Blood Volume Processed (Liters) 59.2 l/min   Dialyzer Clearance Lightly streaked   Duration of Treatment (minutes)   (3 hours)   Hemodialysis Intake (ml) 700 ml   Hemodialysis Output (ml) 2700 ml   NET Removed (ml) 2000   Tolerated Treatment Good   Patient Response to Treatment Tolerated treatment well   Bilateral Breath Sounds Diminished   Edema Generalized Trace   RUE Edema +1   LUE Edema +1   RLE Edema +1   LLE Edema +1   Time Off 1254   Patient Disposition Remain in ICU/ED

## 2022-06-08 NOTE — Progress Notes (Signed)
1920 - Bedside and Verbal shift change report given to Erskine Squibb, Charity fundraiser (oncoming nurse) by Sallyanne Havers, RN (offgoing nurse). Report included the following information Nurse Handoff Report, Index, ED Encounter Summary, ED SBAR, Adult Overview, Surgery Report, Intake/Output, MAR, Recent Results, Med Rec Status, and Cardiac Rhythm Sinus Rhythm .    2000 -  Assessment done and documented, see flow sheet.  Awake and follow commands.  On amio drip at 0.10mcg on flow.    2100 - Seen and examined by Felipa Furnace, NP.  Informed patient temp. See flow sheet for the trends, noted and no further order.     0000 - Re-assessment done and  documented, see flow sheet.  No acute changes noted.    0400 - Blood sample sent to lab for routine investigation.  Vitally stable.    0600 - Patient talked to wife over the phone.      0700 - Bedside report given to Theron Arista, [place]

## 2022-06-08 NOTE — Progress Notes (Signed)
NAME: Jaime [name]        DOB:  October 02, 1946        MRN:  [id number]                     Assessment   :                                               Plan:  AKI  Sepsis  Hypotension>>HTN.  Resp failure  DM-2  Shock liver       AKI related to sepsis and shock. CT showed renal cysts.  He has poor UO (check bladder scan).  Right femoral quinton - move when able     CVVHD started 7/31 until 8/6    Off pressors, intubated    iHD today             Subjective:     Chief Complaint:  intubated, alert. Off pressors. Chart reviewed  Review of Systems:    Symptom Y/N Comments  Symptom Y/N Comments   Fever/Chills    Chest Pain     Poor Appetite    Edema     Cough    Abdominal Pain     Sputum    Joint Pain     SOB/DOE    Pruritis/Rash     Nausea/vomit    Tolerating PT/OT     Diarrhea    Tolerating Diet     Constipation    Other       Could not obtain due to:      Objective:     VITALS:   Last 24hrs VS reviewed since prior progress note. Most recent are:  Vitals:    06/08/22 0400   BP: (!) 142/42   Pulse: 90   Resp: 21   Temp: 100 F (37.8 C)   SpO2: 98%       Intake/Output Summary (Last 24 hours) at 06/08/2022 0630  Last data filed at 06/08/2022 0400  Gross per 24 hour   Intake 523.11 ml   Output 880.6 ml   Net -357.49 ml        Telemetry Reviewed:     PHYSICAL EXAM:  General: Intubated, alert awake  +ETT  Clear  RRR  +edema- L > R  Purple toes+      Lab Data Reviewed: (see below)    Medications Reviewed: (see below)    PMH/SH reviewed - no change compared to H&P  ________________________________________________________________________  Care Plan discussed with:  Patient     Family      RN     Care Manager                    Consultant:          Comments   >50% of visit spent in counseling and coordination of care       ________________________________________________________________________  Tanna Savoy, MD     Procedures: see electronic medical records for all  procedures/Xrays and details which  were not copied into this note but were reviewed prior to creation of Plan.      LABS:  Recent Labs     06/07/22  0401 06/08/22  0402   WBC 36.1* 32.8*   HGB 8.7* 9.1*   HCT 25.9* 26.9*   PLT 65* 102*  Recent Labs     06/05/22  1510 06/06/22  0107 06/06/22  0344 06/06/22  1628 06/07/22  0401 06/08/22  0402   NA 136   < > 137 138 139 138   K 3.5   < > 4.0 4.0 4.2 4.3   CL 104   < > 106 106 107 109*   CO2 23   < > 24 25 27 23    BUN 63*   < > 53* 49* 41* 59*   MG 2.2   < > 2.5* 2.6*  --  2.8*   PHOS 4.1  --  3.1  --   --  3.0    < > = values in this interval not displayed.       Recent Labs     06/06/22  0344 06/08/22  0402   GLOB 2.8 3.2       MEDICATIONS:  Current Facility-Administered Medications   Medication Dose Route Frequency    vancomycin (VANCOCIN) 750 mg in sodium chloride 0.9 % 250 mL IVPB (Vial2Bag)  750 mg IntraVENous Q12H    hydrALAZINE (APRESOLINE) injection 10 mg  10 mg IntraVENous Q4H PRN    albumin human 25% IV solution 25 g  25 g IntraVENous Once PRN    sodium chloride 0.9 % bolus 100 mL  100 mL IntraVENous PRN    insulin NPH (HumuLIN N;NovoLIN N) injection vial 15 Units  15 Units SubCUTAneous 2 times per day    fentaNYL (SUBLIMAZE) injection 50 mcg  50 mcg IntraVENous Q1H PRN    anidulafungin (ERAXIS) 100 mg in sodium chloride 0.9 % 130 mL IVPB  100 mg IntraVENous Q24H    meropenem (MERREM) 1,000 mg in sodium chloride 0.9 % 100 mL IVPB (mini-bag)  1,000 mg IntraVENous Q12H    prismaSol BGK 4/2.5 dialysis solution   Dialysis Continuous    balsum peru-castor oil (VENELEX) ointment   Topical BID    amiodarone (CORDARONE) 450 mg in dextrose 5 % 250 mL infusion (Vial2Bag)  0.5 mg/min IntraVENous Continuous    glucose chewable tablet 16 g  4 tablet Oral PRN    dextrose bolus 10% 125 mL  125 mL IntraVENous PRN    Or    dextrose bolus 10% 250 mL  250 mL IntraVENous PRN    glucagon (rDNA) injection 1 mg  1 mg SubCUTAneous PRN    dextrose 10 % infusion   IntraVENous  Continuous PRN    insulin lispro (HUMALOG) injection vial 0-4 Units  0-4 Units SubCUTAneous 4 times per day    sodium chloride flush 0.9 % injection 5-40 mL  5-40 mL IntraVENous 2 times per day    sodium chloride flush 0.9 % injection 5-40 mL  5-40 mL IntraVENous PRN    0.9 % sodium chloride infusion   IntraVENous PRN    acetaminophen (TYLENOL) tablet 650 mg  650 mg Oral Q6H PRN    Or    acetaminophen (TYLENOL) suppository 650 mg  650 mg Rectal Q6H PRN    ondansetron (ZOFRAN) injection 4 mg  4 mg IntraVENous Q6H PRN    chlorhexidine (PERIDEX) 0.12 % solution 15 mL  15 mL Mouth/Throat BID    pantoprazole (PROTONIX) 40 mg in sodium chloride (PF) 0.9 % 10 mL injection  40 mg IntraVENous Q12H

## 2022-06-08 NOTE — Other (Signed)
Primary RN SBAR: Bluford Main, RN  Patient Education: N/A  Hepatitis B Surface Ag   Date/Time Value Ref Range Status   06/01/2022 10:08 PM <0.10 Index Final     Hep B S Ab   Date/Time Value Ref Range Status   06/01/2022 10:08 PM <3.10 mIU/mL Final        06/08/22 0954   Vital Signs   BP (!) 120/56   Pulse 92   Respirations (!) 38   SpO2 96 %   Pain Assessment   Pain Assessment Critical Care Pain Observation Tool (CPOT)   Pain Level 0   Treatment   Time On 0954   Treatment Goal 2000  (as tolerated)   Observations & Evaluations   Level of Consciousness 0   Oriented X unable to verbalize, intubated on vent   Heart Rhythm Regular   Respiratory Quality/Effort Unlabored   O2 Device Ventilator   Bilateral Breath Sounds Diminished   Skin Color Other (comment)  (appropriate for ethnicity)   Skin Condition/Temp Warm;Dry   Appetite Other (Comment)  (tube feed)   Abdomen Inspection Other (Comment);Soft  (surgical wound)   Bowel Sounds (All Quadrants) Active   Edema Generalized   Edema Generalized Trace   RUE Edema +1   LUE Edema +1   RLE Edema +1   LLE Edema +1   Technical Checks   Dialysis Machine No. 08   RO Machine Number 08   Dialyzer Lot No. [id number]   Tubing Lot Number [id number]   All Connections Secure Yes   NS Bag Yes   Saline Line Double Clamped Yes   Dialyzer Revaclear 300   Prime Volume (mL) 200 mL   ICEBOAT I;C;E;B;O;A;T   RO Machine Log Sheet Completed Yes   Machine Alarm Self Test Completed;Passed   Child psychotherapist Function;pH Reading   Sport and exercise psychologist Conductivity 14   Manual Conductivity 14   Manual Ph 7.4   Bleach Test (Neg) Yes   Bath Temperature 98.6 F (37 C)   Treatment Initiation   Dialyze Hours 3   Treatment  Initiation Universal Precautions maintained;Lines secured to patient;Connections secured;Prime given;Venous Parameters set;Arterial Parameters set;IT consultant engaged;Saline line double clamped;Revaclear Dialyzer;REV-300   Dialysis  Bath   K+ (Potassium) 3   Ca+ (Calcium) 2.5   Na+ (Sodium) 138   HCO3 (Bicarb) 30   Hemodialysis Central Access Right Femoral vein   Placement Date/Time: 06/01/22 1830   Present on Admission/Arrival: No  Inserted by: Dr. Christel Mormon  Insertion Practices: Chlorohexadine skin antisepsis;Maximal barrier precautions;Sterile ultrasound technique;Optimal catheter site selection;Betadine...   Continued need for line? Yes   Site Assessment Clean, dry & intact   CVC Lumen Status Flushed;Brisk blood return   Venous Lumen Status Flushed;Brisk blood return   Arterial Lumen Status Flushed;Brisk blood return   Alcohol Cap Used Yes   Line Care Connections checked and tightened   Dressing Type Bacteriocidal;Transparent   Date of Last Dressing Change 06/05/22   Dressing Status Clean, dry & intact

## 2022-06-08 NOTE — Progress Notes (Signed)
Comprehensive Nutrition Assessment    Type and Reason for Visit:  Reassess    Nutrition Recommendations/Plan:   Continue trickle tube feeds, advancing per Surgery  Current rate 65m/hr + flush 574mq 4hr. Goal rate 4082mr + Prosource BID (provides 2086kcals/120gPro/209gCHO/972m49m Stop TF if any signs/sx of NOMI develop (increasing abdominal pain, distension or lactic acid levels)      Malnutrition Assessment:  Malnutrition Status:  Insufficient data (06/04/22 1319)        Nutrition Assessment:     Chart reviewed and case discussed during CCU rounds. Pt transitioned to HD. He remains intubated with plans to extubate after repeat of abdominal CT today. Pt had been receiving trickle tube feeds at 10mL59m but these were held last night d/t residuals of 105mL 45m RN, this was per direction of surgery to hold if residuals 100mL o26mre). RN to resume tube feeds as able after extubation today, residuals are down. No BM documented. No bowel regimen. No family at bedside at time of RD visit. Will continue monitoring.       Nutrition Related Findings:    Labs: Cr 3.35, elevated LFTs.   Meds: eraxix, rocephin, NPH, prevacid, flagyl, protonix.   Edema: 1+ all extremities.   BM PTA.   Wound Type: Surgical Incision       Current Nutrition Intake & Therapies:    Average Meal Intake: NPO     Diet NPO  ADULT TUBE FEEDING; Nasogastric; 2.0 Calorie; Continuous; 15; No; 50; Q 4 hours; Protein; Prosource BID    Anthropometric Measures:  Height: 172.7 cm (_0 )  Ideal Body Weight (IBW): 154 lbs (70 kg)       Current Body Weight: 228 lb 9.9 oz (103.7 kg), 148.5 % IBW. Weight Source: Bed Scale  Current BMI (kg/m2): 34.8                          BMI Categories: Obese Class 1 (BMI 30.0-34.9)    Estimated Daily Nutrient Needs:  Energy Requirements Based On: Formula  Weight Used for Energy Requirements: Current  Energy (kcal/day): 2147 kcals (MSJ 2180)  Weight Used for Protein Requirements: Ideal  Protein (g/day): 105-140g (1.5-2gPro/kg  IBW)  Method Used for Fluid Requirements: Other (Comment)  Fluid (ml/day): per MD    Nutrition Diagnosis:   Inadequate protein-energy intake related to impaired respiratory function as evidenced by NPO or clear liquid status due to medical condition    Nutrition Interventions:   Food and/or Nutrient Delivery: Continue Current Tube Feeding  Nutrition Education/Counseling: No recommendation at this time  Coordination of Nutrition Care: Continue to monitor while inpatient, Interdisciplinary Rounds       Goals:  Previous Goal Met: Progressing toward Goal(s)  Goals: Tolerate nutrition support at goal rate, by next RD assessment       Nutrition Monitoring and Evaluation:   Behavioral-Environmental Outcomes: None Identified  Food/Nutrient Intake Outcomes: Enteral Nutrition Intake/Tolerance, IVF Intake  Physical Signs/Symptoms Outcomes: Biochemical Data, Nutrition Focused Physical Findings, Skin, Weight, GI Status, Hemodynamic Status, Fluid Status or Edema    Discharge Planning:    Too soon to determine     Ginni Eichler MKristin Bruinsontact: [phone]

## 2022-06-08 NOTE — Plan of Care (Signed)
Problem: Safety - Medical Restraint  Goal: Remains free of injury from restraints (Restraint for Interference with Medical Device)  Description: INTERVENTIONS:  1. Determine that other, less restrictive measures have been tried or would not be effective before applying the restraint  2. Evaluate the patient's condition at the time of restraint application  3. Inform patient/family regarding the reason for restraint  4. Q2H: Monitor safety, psychosocial status, comfort, nutrition and hydration  Outcome: Progressing  Flowsheets (Taken 06/08/2022 0903)  Remains free of injury from restraints (restraint for interference with medical device): (anuric) --

## 2022-06-08 NOTE — Progress Notes (Signed)
Vascular ABI study attempted; patient is currently receiving hemodialysis. Will try again at a later time/date.    Noreene Filbert, RVT  Vascular Lab

## 2022-06-09 ENCOUNTER — Inpatient Hospital Stay: Admit: 2022-06-09 | Payer: MEDICARE

## 2022-06-09 ENCOUNTER — Telehealth (HOSPITAL_COMMUNITY): Payer: Self-pay | Admitting: Cardiology

## 2022-06-09 LAB — POCT GLUCOSE
POC Glucose: 180 mg/dL — ABNORMAL HIGH (ref 65–117)
POC Glucose: 167 mg/dL — ABNORMAL HIGH (ref 65–117)
POC Glucose: 162 mg/dL — ABNORMAL HIGH (ref 65–117)
POC Glucose: 204 mg/dL — ABNORMAL HIGH (ref 65–117)

## 2022-06-09 LAB — CBC
Hematocrit: 28.6 % — ABNORMAL LOW (ref 36.6–50.3)
Hemoglobin: 9.5 g/dL — ABNORMAL LOW (ref 12.1–17.0)
MCH: 29.5 PG (ref 26.0–34.0)
MCHC: 33.2 g/dL (ref 30.0–36.5)
MCV: 88.8 FL (ref 80.0–99.0)
MPV: 12.8 FL (ref 8.9–12.9)
Nucleated RBCs: 0.1 PER 100 WBC — ABNORMAL HIGH
Platelets: 132 10*3/uL — ABNORMAL LOW (ref 150–400)
RBC: 3.22 M/uL — ABNORMAL LOW (ref 4.10–5.70)
RDW: 17.4 % — ABNORMAL HIGH (ref 11.5–14.5)
WBC: 30.5 10*3/uL — ABNORMAL HIGH (ref 4.1–11.1)
nRBC: 0.03 10*3/uL — ABNORMAL HIGH (ref 0.00–0.01)

## 2022-06-09 LAB — BASIC METABOLIC PANEL
Anion Gap: 8 mmol/L (ref 5–15)
BUN: 59 MG/DL — ABNORMAL HIGH (ref 6–20)
Bun/Cre Ratio: 15 (ref 12–20)
CO2: 21 mmol/L (ref 21–32)
Calcium: 7.5 MG/DL — ABNORMAL LOW (ref 8.5–10.1)
Chloride: 107 mmol/L (ref 97–108)
Creatinine: 3.85 MG/DL — ABNORMAL HIGH (ref 0.70–1.30)
Est, Glom Filt Rate: 16 mL/min/{1.73_m2} — ABNORMAL LOW (ref >60)
Glucose: 183 mg/dL — ABNORMAL HIGH (ref 65–100)
Potassium: 4.6 mmol/L (ref 3.5–5.1)
Sodium: 136 mmol/L (ref 136–145)

## 2022-06-09 LAB — HEPATIC FUNCTION PANEL
ALT: 162 U/L — ABNORMAL HIGH (ref 12–78)
AST: 142 U/L — ABNORMAL HIGH (ref 15–37)
Albumin/Globulin Ratio: 0.5 — ABNORMAL LOW (ref 1.1–2.2)
Albumin: 1.8 g/dL — ABNORMAL LOW (ref 3.5–5.0)
Alk Phosphatase: 215 U/L — ABNORMAL HIGH (ref 45–117)
Bilirubin, Direct: 7.4 MG/DL — ABNORMAL HIGH (ref 0.0–0.2)
Globulin: 3.6 g/dL (ref 2.0–4.0)
Total Bilirubin: 9 MG/DL — ABNORMAL HIGH (ref 0.2–1.0)
Total Protein: 5.4 g/dL — ABNORMAL LOW (ref 6.4–8.2)

## 2022-06-09 LAB — MAGNESIUM: Magnesium: 2.6 mg/dL — ABNORMAL HIGH (ref 1.6–2.4)

## 2022-06-09 LAB — PHOSPHORUS: Phosphorus: 4.5 MG/DL (ref 2.6–4.7)

## 2022-06-09 MED ORDER — SENNA-DOCUSATE SODIUM 8.6-50 MG PO TABS
Freq: Two times a day (BID) | ORAL | Status: AC
Start: 2022-06-09 — End: 2022-06-17
  Administered 2022-06-09 – 2022-06-17 (×14): 2 via ORAL

## 2022-06-09 MED ORDER — POLYETHYLENE GLYCOL 3350 17 G PO PACK
17 g | Freq: Every day | ORAL | Status: AC
Start: 2022-06-09 — End: 2022-06-19
  Administered 2022-06-09 – 2022-06-17 (×7): 17 g via ORAL

## 2022-06-09 MED FILL — HUMULIN N 100 UNIT/ML SC SUSP: 100 UNIT/ML | SUBCUTANEOUS | Qty: 1

## 2022-06-09 MED FILL — METRONIDAZOLE 500 MG/100ML IV SOLN: 500 MG/100ML | INTRAVENOUS | Qty: 100

## 2022-06-09 MED FILL — HEPARIN SODIUM (PORCINE) 5000 UNIT/ML IJ SOLN: 5000 UNIT/ML | INTRAMUSCULAR | Qty: 1

## 2022-06-09 MED FILL — CEFTRIAXONE SODIUM 2 G IJ SOLR: 2 g | INTRAMUSCULAR | Qty: 2000

## 2022-06-09 MED FILL — HEALTHYLAX 17 G PO PACK: 17 g | ORAL | Qty: 1

## 2022-06-09 MED FILL — LANSOPRAZOLE 30 MG PO TBDD: 30 MG | ORAL | Qty: 1

## 2022-06-09 MED FILL — SENNA PLUS 8.6-50 MG PO TABS: ORAL | Qty: 2

## 2022-06-09 MED FILL — AMIODARONE HCL 450 MG/9ML IV SOLN: 450 MG/9ML | INTRAVENOUS | Qty: 9

## 2022-06-09 MED FILL — HUMALOG 100 UNIT/ML IJ SOLN: 100 UNIT/ML | INTRAMUSCULAR | Qty: 1

## 2022-06-09 NOTE — Telephone Encounter (Signed)
Echocardiogram was cancelled for reason below:  Hospitalized (per pt's wife, pt is in the hospital at [facility]. will cb to r/s) 06/08/2022 4:57 PM VO:HYWVPX, ANGELINE S  Order will be removed from the active echo wq and when pt calls back to reschedule we will reinstate the order.

## 2022-06-09 NOTE — Procedures (Signed)
[facility]        Procedure Note - Central Venous Access:   Performed by Modena Slater, MD    Obtained informed Consent.    Immediately prior to the procedure, the patient was reevaluated and found suitable for the planned procedure and any planned medications.     Immediately prior to the procedure a time out was called to verify the correct patient, procedure, equipment, staff, and marking as appropriate.     The site was prepped with ChloraPrep.   Using Seldinger technique a Double lumen dialysis catheter was placed in the Right, Internal Jugular Vein via direct cannulation with 1 number of attempts for Monitoring, Blood Drawing and IV Access.  Ultrasound Guidance was utilized.  There was good blood return.  The following complications were encountered: None.  A follow-up chest x-ray was ordered post procedure.  The procedure was tolerated well.        Modena Slater, MD  Pulmonary and Critical Care

## 2022-06-09 NOTE — Progress Notes (Incomplete Revision)
1910 - Bedside and Verbal shift change report given to Erskine Squibb, Charity fundraiser (oncoming nurse) by Theron Arista, RN (offgoing nurse). Report included the following information Nurse Handoff Report, Adult Overview, Intake/Output, MAR, Med Rec Status, and Cardiac Rhythm Sinus Rhythm    1930 - Assessment done and documented, see flow sheet.  Patient Oriented x3, appeared weak but follow commands.     2000 - Removed HD cath at right femoral, applied pressure and occlusive dressing.     2200 - Turned patient to sides.  All needs attended.    0000 - Re-assessment done and documented, see flow sheet.  No acute changes noted.  Patient appeared weak.    0100 - Dressing changed from the JP drains.      0400 - RE-assessment done and documented, see flow sheet

## 2022-06-09 NOTE — Progress Notes (Signed)
Admit Date: 05/31/2022    POD 8 Days Post-Op    Procedure:  Procedure(s):  LAPAROTOMY EXPLORATORY  LAPAROSCOPY DIAGNOSTIC  ENDOSCOPY OF ILEAL CONDUIT    Subjective:     Patient extubated, no c/o.  No flatus or BM yet.  Tol Tfs at 15 cc/hr    Objective:     Blood pressure 133/65, pulse 77, temperature 98.6 F (37 C), temperature source Axillary, resp. rate 25, height _0  (1.727 m), weight 228 lb 9.9 oz (103.7 kg), SpO2 98 %.    Temp (24hrs), Avg:99.9 F (37.7 C), Min:98.6 F (37 C), Max:100.5 F (38.1 C)      Physical Exam:  GENERAL: appears stated age, LUNG: clear to auscultation bilaterally, HEART: regular rate and rhythm, ABDOMEN: soft, distended, wound c/d/I, JP drainage serous appearing today, pigtail abscess drain sanguinous, EXTREMITIES:  extremities normal, atraumatic, no cyanosis or edema    Labs:   Recent Results (from the past 24 hour(s))   POCT Glucose    Collection Time: 06/08/22 11:09 AM   Result Value Ref Range    POC Glucose 114 65 - 117 mg/dL    Performed by: Tilden Fossa RN    Arterial Blood Gas with ICA    Collection Time: 06/08/22  4:21 PM   Result Value Ref Range    pH, Arterial 7.49 (H) 7.35 - 7.45      pCO2, Arterial 30 (L) 35 - 45 mmHg    pO2, Arterial 91 80 - 100 mmHg    HCO3, Arterial 22 22 - 26 mmol/L    Base deficit, arterial blood 0.9 mmol/L    POC O2 SAT 97 92 - 97 %    Calcium, Ionized 1.04 (L) 1.13 - 1.32 mmol/L    O2 Method VENT      POC Pressure Support 5      POC PEEP/CPA 5.0      Source ARTERIAL      Site DRAWN FROM ARTERIAL LINE      Allen Test NOT APPLICABLE     POCT Glucose    Collection Time: 06/08/22  5:01 PM   Result Value Ref Range    POC Glucose 123 (H) 65 - 117 mg/dL    Performed by: Tilden Fossa RN    Basic Metabolic Panel    Collection Time: 06/09/22  3:42 AM   Result Value Ref Range    Sodium 136 136 - 145 mmol/L    Potassium 4.6 3.5 - 5.1 mmol/L    Chloride 107 97 - 108 mmol/L    CO2 21 21 - 32 mmol/L    Anion Gap 8 5 - 15 mmol/L    Glucose 183 (H) 65 - 100  mg/dL    BUN 59 (H) 6 - 20 MG/DL    Creatinine 3.85 (H) 0.70 - 1.30 MG/DL    Bun/Cre Ratio 15 12 - 20      Est, Glom Filt Rate 16 (L) >60 ml/min/1.44m    Calcium 7.5 (L) 8.5 - 10.1 MG/DL   CBC    Collection Time: 06/09/22  3:42 AM   Result Value Ref Range    WBC 30.5 (H) 4.1 - 11.1 K/uL    RBC 3.22 (L) 4.10 - 5.70 M/uL    Hemoglobin 9.5 (L) 12.1 - 17.0 g/dL    Hematocrit 28.6 (L) 36.6 - 50.3 %    MCV 88.8 80.0 - 99.0 FL    MCH 29.5 26.0 - 34.0 PG    MCHC 33.2 30.0 -  36.5 g/dL    RDW 17.4 (H) 11.5 - 14.5 %    Platelets 132 (L) 150 - 400 K/uL    MPV 12.8 8.9 - 12.9 FL    Nucleated RBCs 0.1 (H) 0 PER 100 WBC    nRBC 0.03 (H) 0.00 - 0.01 K/uL   Magnesium    Collection Time: 06/09/22  3:42 AM   Result Value Ref Range    Magnesium 2.6 (H) 1.6 - 2.4 mg/dL   Phosphorus    Collection Time: 06/09/22  3:42 AM   Result Value Ref Range    Phosphorus 4.5 2.6 - 4.7 MG/DL   Hepatic Function Panel    Collection Time: 06/09/22  3:42 AM   Result Value Ref Range    Total Protein 5.4 (L) 6.4 - 8.2 g/dL    Albumin 1.8 (L) 3.5 - 5.0 g/dL    Globulin 3.6 2.0 - 4.0 g/dL    Albumin/Globulin Ratio 0.5 (L) 1.1 - 2.2      Total Bilirubin 9.0 (H) 0.2 - 1.0 MG/DL    Bilirubin, Direct 7.4 (H) 0.0 - 0.2 MG/DL    Alk Phosphatase 215 (H) 45 - 117 U/L    AST 142 (H) 15 - 37 U/L    ALT 162 (H) 12 - 78 U/L   POCT Glucose    Collection Time: 06/09/22  6:00 AM   Result Value Ref Range    POC Glucose 180 (H) 65 - 117 mg/dL    Performed by: Everitt Amber RN        Data Review images and reports reviewed    Assessment:     Principal Problem:    Gastric perforation ([place])  Active Problems:    Type 2 diabetes mellitus with hyperglycemia, without long-term current use of insulin (HCC)    Intestinal obstruction (HCC)    Severe sepsis ([place])    Septic shock ([place])    Escherichia coli septicemia ([place])    Liver abscess    Pneumoperitoneum    Acute respiratory failure with hypoxia (HCC)    AKI (acute kidney injury) ([place])    Multi-organ failure with heart failure  (HCC)    Thrombocytopenia (HCC)    E coli bacteremia    Hepatic abscess    Peripheral arterial disease (HCC)    Hyperbilirubinemia    Gram negative sepsis ([place])  Resolved Problems:    * No resolved hospital problems. *      Plan/Recommendations/Medical Decision Making:     Okay for vent weaning and extubation  Now on HD  E coli in hepatic drain cxs  Continue supportive care per intensivist service.  CT looked good, decreased hepatic abscess collection  Advance tube feeds to 30 cc/hr    Laure Kidney, MD  [facility]

## 2022-06-09 NOTE — Progress Notes (Cosign Needed)
Vascular Surgery Progress Note  Liz Beach ACNP-BC      Date:06/09/2022       Room:2513/01  Patient Name:Jaime [name]     Date of Birth:1946/03/08     Age:76 y.o.    Subjective      Orville Greth is a very pleasant obese 75 y.o. male with a pmhx significant for DM, HTN, HLD, HFpEF, AS, and CKD. He is a non-smoker. He was admitted to the hospital with septic shock secondary to a suspected perforated viscus w/ enterobacteriaceae and ecoli bacteremia.  He is s/p emergent exploratory laparotomy 06/01/22.  A CT scan of the abdomen was significant for liver abscesses and he status post drainage with IR.  He is status post cardioversion x2 for multiple episodes of ventricular tachycardia.      This am he is extubated.  He is following commands and appropriate in conversation.  He continues to have an NG tube and is wearing oxygen.  He continues on intermittent hemodialysis. His urinary output is improving. His leukocytosis has peaked and is now trending down.  His thrombocytopenia is slowly improving.      He had a prolonged period of hypoperfusion of the peripheral extremities.  He continues to have cyanosis of the bilateral toes.  They are minimally improved from yesterday.  ABIs continue to be pending.    Objective           Vitals Last 24 Hours:  TEMPERATURE:  Temp  Avg: 99.8 F (37.7 C)  Min: 98.6 F (37 C)  Max: 100.5 F (38.1 C)  RESPIRATIONS RANGE: Resp  Avg: 30.3  Min: 23  Max: 37  PULSE OXIMETRY RANGE: SpO2  Avg: 96.9 %  Min: 95 %  Max: 99 %  PULSE RANGE: Pulse  Avg: 90.3  Min: 77  Max: 105  BLOOD PRESSURE RANGE: Systolic (24hrs), Avg:119 , Min:93 , Max:135   ; Diastolic (24hrs), Avg:59, Min:45, Max:71    I/O (24Hr):    Intake/Output Summary (Last 24 hours) at 06/09/2022 1128  Last data filed at 06/09/2022 1038  Gross per 24 hour   Intake 1987.51 ml   Output 3085 ml   Net -1097.49 ml     Objective:  General Appearance:  Comfortable.    Vital signs: (most recent): Blood pressure 135/71, pulse 80, temperature  98.6 F (37 C), temperature source Axillary, resp. rate 28, height 1.727 m (5\' 8" ), weight 103.7 kg (228 lb 9.9 oz), SpO2 95 %.  No fever.  (He is mildly tachypneic without respiratory distress he continues to wear oxygen).    Output: Producing urine.    HEENT: (NGT)    Lungs:  Normal effort and tachypnea.    Heart: Normal rate.  Irregular rhythm.    Abdomen: Abdomen is soft and non-distended.  (Abdominal dressing).  (Mildly tender as expected).     Extremities: Normal range of motion.    Pulses: (Feet are warm with persistent cyanosis of the toes.)    Neurological: Patient is alert.  (He is oriented to place and self.).    Skin:  Pale.      Labs    Labs:  CBC:  Recent Labs     06/07/22  0401 06/08/22  0402 06/09/22  0342   WBC 36.1* 32.8* 30.5*   RBC 2.92* 3.05* 3.22*   HGB 8.7* 9.1* 9.5*   HCT 25.9* 26.9* 28.6*   MCV 88.7 88.2 88.8   RDW 15.3* 15.6* 17.4*   PLT 65* 102* 132*  CHEMISTRIES:  Recent Labs     06/06/22  1628 06/07/22  0401 06/08/22  0402 06/09/22  0342   NA 138 139 138 136   K 4.0 4.2 4.3 4.6   CL 106 107 109* 107   CO2 25 27 23 21    BUN 49* 41* 59* 59*   CREATININE 2.18* 2.15* 3.35* 3.85*   GLUCOSE 249* 204* 143* 183*   PHOS  --   --  3.0 4.5   MG 2.6*  --  2.8* 2.6*       Assessment//Plan           Cyanosis of the bilateral toes  -Secondary to prolonged hypoperfusion due to septic shock  Diabetic neuropathy   ABIs continue to be pending.     Perforated Viscus  -managed by Dr. Lenard Forth  Large left lobe hepatic abscess   -with other areas of probable abscess/phlegmonous infection scattered throughout the right lobe liver  -s/p IR drainage  Enterobacteriaceae and Ecoli septicemia   Septic shock/Lactic acidosis  -Leukocytosis has peaked and is now trending down.  Thrombocytopenia is improving.  Weaned off vasopressor support.  Shock liver  -with Hyperbilirubinemia, Thrombocytopenia (PLT count improving), and Coagulopathy (INR 2.0)  Hypoalbuminemia       Acute hypoxic respiratory failure  -Intubated  7/31-to-06/08/22     Subendocardial ischemia  -Troponin peaked at 173  -Likely demand ischemia  Ventricular tachycardia  -electric CV x 2   Persistent atrial fibrillation  -amio drip   Bilateral pleural effusions  Acute on chronic grade 1 diastolic congestive heart failure  Moderate aortic stenosis  History of hypertension  Mixed hyperlipidemia   -triglycerides 1118  Cardiology following     Acute kidney injury  Oliguria  -Urinary output improving.  Intermittent hemodialysis continues.  Chronic renal disease stage 3B  -baseline creatinine 1.7  Anemia of chronic disease  -Improving  Hypermagnesemia  Elevated CK  -Peaked at 1876.  Now trending down.  Nephrology following     Acute encephalopathy   -Resolving    Diabetes mellitus  -Controlled  Obesity      Management of comorbid conditions by CC team.     VTE Prophylaxis:  SCDs contraindicated in diffuse poor perfusion  Subcu per primary team     Disposition:  TBD-after patient is able to tolerate physical therapy    Electronically signed by Liz Beach ACNP-BC on 06/09/22 at 11:28 AM EDT

## 2022-06-09 NOTE — Other (Signed)
[place]  [facility]  DIABETES MANAGEMENT CONSULT    Consulted by Provider for advanced nursing evaluation and care for inpatient blood glucose management.    Evaluation and Action Plan   This 76 year old Caucasian male was admitted with RUQ pain and underwent laparotomy 06/01/22. Septic shock with multi-organ failure. Several episodes of Vtach. Remains on vent support, pressors & CRRT. There is a new finding of liver abscess on CT. There is concern for LLE due to diminished pedal pulse. Per vascular, he is not a candidate for endovascular or surgical revascularization at this time. AB index is planned.    As for BG management in this patient with known Type 2 diabetes, began low dose basal insulin 06/02/22 along with corrective insulin to address impact of steroid. Bgs had been 170-180s but continued to rise. Insulin adjusted on Friday 06/05/22. HC stopped 06/07/22. Bgs in target. As TF rate increases, may need to increase NPH insulin dose.    Management Rationale Action Plan   Medication   Overriding steroid effect Based on BG pattern Continue NPH insulin to 15 units twice daily     Additional orders   TF running @30cc /hr with goal rate of 40cc/hr (209 CHO/D)            Initial Presentation   Jaime [name] is a 75 y.o. male admitted 05/31/22 from ER after experiencing RUQ pain associated with generalized weakness, nausea & vomiting for several days PTA. AMS+. Hypotension+. Fever+. Dyspneic+. He is visiting from 06/02/22.  Ectopic atrial tachycardia  ER exam:  Cardiovascular:      Rate and Rhythm: Regular rhythm. Tachycardia present.   Pulmonary:      Comments: He is tachypneic, coarse breath sounds on inspiration at bilateral bases  Abdominal:      Palpations: Abdomen is soft.      Comments: Tender to palpation diffusely in the right sided abdomen without guarding or rebound tenderness   LAB: WBC 16.6. Normal H&H. Low platelets.BG 272/AG 17. AKI. Elevated liver enzymes. NT pro-BNP 20,980.  Lipase >3000.     CXR:   Right IJ catheter satisfactory position without pneumothorax. ET   tube and NG tube as above   CT Head: Negative  CT ABd:   Extraluminal air bubbles in the right upper quadrant concerning for gastric   perforation. Inflammatory changes are noted about the tail of the pancreas   extending into the left anterior pararenal space, please correlate with any   evidence of acute pancreatitis. Bilateral lower lobe infiltrates.     [place] reviewed. No pertinent past medical history.     INITIAL DX: Septicemia (HCC) [A41.9]  Gastric perforation (HCC) [K25.5]  Perforated abdominal viscus [R19.8]     Current Treatment     TX: 06/01/22 LAPAROTOMY EXPLORATORY  LAPAROSCOPY DIAGNOSTIC ENDOSCOPY OF ILEAL CONDUIT  EGD    Hospital Course   Clinical progress has been complicated by multi-organ failure.   06/01/22 Dialysis  06/02/22 In septic shock with multi-organ failure. On vent support, sedation, pressor support and bicarb infusion. On Abx. Two runs of VT this morning requiring defibrillation; cardiology conversation anticipated. CRRT+  06/03/22 Remains on vent support C vent Fi02 50%/Peep 7. Low grade fever; on Abx. Steroids+. Afib/bradycardic. Pressors+. CRRT+.   06/04/22 Remains on vent support and pressors. On Abx. Concern for LLE. CRRT. CT: Enlarged left hepatic abscess contain gas with other areas of probable abscess/phlegmonous infection.  06/05/22 On AC vent support. On Amio, vaso, levo & Fentanyl  infusions. OG draining green fluid. CRRT+.  Vascular: Patient remains at risk for progressive deterioration and limb loss but needs to be weaned from pressor support before intervention is safe.  06/08/22 On Spon vent support; plans to extubate after CT today. Fentanyl pushes. H&H low. CRRT+. Mottled & blue toes bilaterally. Vascular AKI study to be completed  06/09/22 Alert. 02NC. Amio infusion+. Pedal pulses returning (no need to use doppler)    Diabetes History   Type 2 diabetes on metformin  therapy    Diabetes-related Medical History deferred    Diabetes Medication History - based on PTA list  Drug class Currently in use   Biguanide [x]  Metformin (Glucophage)  []  Metformin ER (Glucophage XL)     Diabetes self-management practices: deferred  Overall evaluation:    [x]  Unknown A1c     Subjective   NA     Objective   Physical exam  General Obese male  Neuro  Eyes open. Alert  Vital Signs   Vitals:    06/09/22 1000   BP: 135/71   Pulse: 80   Resp: 28   Temp:    SpO2: 95%     Extremities No foot wounds    Diabetic foot exam:    Left Foot     Visual Exam: Cool & dry. Thickened toenails    Pulse DP: +1   Right Foot   Visual Exam: Cool & dry. Thickened toenails    Pulse DP: +1      Laboratory  Recent Labs     06/07/22  0401 06/08/22  0402 06/09/22  0342   WBC 36.1* 32.8* 30.5*   HGB 8.7* 9.1* 9.5*   HCT 25.9* 26.9* 28.6*   MCV 88.7 88.2 88.8   PLT 65* 102* 132*       Recent Labs     06/07/22  0401 06/08/22  0402 06/09/22  0342   NA 139 138 136   K 4.2 4.3 4.6   CL 107 109* 107   CO2 27 23 21    PHOS  --  3.0 4.5   BUN 41* 59* 59*   CREATININE 2.15* 3.35* 3.85*       Lab Results   Component Value Date    ALT 162 (H) 06/09/2022    AST 142 (H) 06/09/2022    ALKPHOS 215 (H) 06/09/2022    BILITOT 9.0 (H) 06/09/2022     No results found for: TSH, TSHREFLEX, TSHFT4, TSHELE, TSH3GEN, TSHHS   Lab Results   Component Value Date    LABA1C 7.7 (H) 06/02/2022     Factors impacting BG management  Factor Dose Comments   Nutrition:  TF   40cc/hr   (209gms CHO)    Pain Fent PRN    Infection Eraxis Q24 hrs  Rocephin Q24 hrs  Flagyl Q8 hrs WBCs elevated but trending down   Other:   Kidney function  Liver function   AKI  Liver enzymes elevated      Blood glucose pattern      Significant diabetes-related events over the past 24-72 hours  05/31/22 Admission BG 233  06/01/22 Dex 4mg  => BG 100s => HC started  06/02/22 Bgs into 200s  06/03/22 Bgs in 170-180s  06/04/22 Bgs rising abit. HC reduced frequency today. Trickle feeds to begin  06/05/22  Bgs rising into 200s  06/08/22 Bgs in target  06/09/22 Bgs rising with start of TF at higher rate    Assessment and Nursing Intervention   Nursing Diagnosis  Risk for unstable blood glucose pattern   Nursing Intervention Domain 5250 Decision-making Support   Nursing Interventions Examined current inpatient diabetes/blood glucose control   Explored factors facilitating and impeding inpatient management  Explored corrective strategies with patient and responsible inpatient provider   Informed patient of rational for insulin strategy while hospitalized     Billing Code(s)   []  99233 IP subsequent hospital care - 50 minutes   []  99232 IP subsequent hospital care - 35 minutes   [x]  99231 IP subsequent hospital care - 25 minutes   []  99221 IP initial hospital care - 40 minutes     Before making these care recommendations, I personally reviewed the hospitalization record, including notes, laboratory & diagnostic data and current medications, and examined the patient at the bedside (circumstances permitting) before determining care. More than fifty (50) percent of the time was spent in patient counseling and/or care coordination.  Total minutes: 25    , APRN - CNS  Clinical Nurse Specialist - Diabetes & endocrine disorders  [facility]  Access via [employer]

## 2022-06-09 NOTE — Progress Notes (Addendum)
0700: Bedside shift change report given to Theron Arista RN (oncoming nurse) by Erskine Squibb RN (offgoing nurse). Report included the following information Nurse Handoff Report.     0840: General Sx attending at bedside. Plan to increase TF to rate of 30/hr. Sips of water as tolerated. Patient has no coughing with water.    1010: IDR; plans:    Remove Arterial line;  Remove R IJ CVC;  Remove Femoral HD access & insert upper; contact Nephrology to ask if we want tunneling/non-tunneling HD access for new insertion.  Continuing TF at new rate of 30/hr; sips of water; monitoring residual  PT/OT consult for mobilization    1015: Nephrology wants non-tunneling [facility]) catheter for HD.     1225: Right radial Arterial line removed. No complications. Site dressed with gauze and tegaderm.    1240: Plan to remove CVC and Femoral HD access; and insert R IJ [place] at bedside today.    1840: Timeout performed for Right IJ Non-tunneling HD Catheter insertion at bedside by Rutherford Limerick MD.     1900: Right IJ Non-tunneling HD catheter insertion completed. CXR STAT ordered per Rutherford Limerick MD.    1915: Handoff report given to Polo Riley.

## 2022-06-09 NOTE — Progress Notes (Signed)
[facility] PROGRESS NOTE.      Name: Jaime [name]   DOB: May 12, 1946   MRN: [id number]   Date: 06/09/2022      Chief Complaint   Patient presents with    Hypotension     Hx hypertension - BP is 70s/40s    Altered Mental Status    Nausea    Emesis       Reason for ICU:     Septic shock with multi organ failure  Acute abdomen, s/p emergent exploratory laparotomy 06/01/22    Subjective/Hospital Course:     76 y/o male ([facility] [place]) with PMHx significant of HTN, HLD and T2DM. Came in ED 7/30 noon for N/V/Abd pain and generalized weakess. Found to be encephalopathic, shock state (BP 80s and lactate 10), AKI (Cr 4.06). Received 2L LR, started on Levophed, Vancomycin/Zosyn. Surgery consulted and patient underwent emergent exploratory laparotomy last night that did not reveal perforation but fluid collection in right upper quadrant that was sent for cultures and sensitivity.   7/31/: Patient transferred to ICU post op early AM. Increasing pressor needs, shock liver, thrombocytopenia, coagulopathic, anuric AKI with developing hyperkalemia and started on [facility]. Stress dose steroids and thiamine added for refractory shock. Abx changed to Vanc and merrem.   06/02/22: VT episodes with pulse, shocked x 3 and amio boluses 150 mg each x 3 fallowed by amio drip. Cardio consulted.   8/3: Intubated and sedated. ON of levophed and vasopressin, on 50% Fio2 and PEEP of 7. CRRT ongoing with no factor. RASS neg 5. On 0.5mg /hr of versed gtt. Which is I turned off during rounds.   8/4: Remains intubated and sedated. On fentanyl gtt. Still RASS is neg 5. Down to of levophed. On CRRT with factor of 32ml/hr started today. Found to have liver abscesses on CT abd, plan to place drain today by IR.  8/5: Patient remains intubated and sedated, Overnight had episodes of NSVT. Currently on of levophed gtt.   8/6: Patient remains intubated, on low dose fentanyl. Intermittently following commands. More awake than  yesterday. On CRRT with factor fo 38ml/hr.  8/7: Patient is able to follow commands today. Off pressors. Started on SBT.   8/8: Patient was extubated on 8/7, reports feeling better. On 2 lit oxygen. Still on amiodarone gtt.     Principle ICU Diagnosis     # Septic shock, 2/2 intra abdominal source.    # Acute abdomen from suspected abdominal viscus perforation   S/p emergent exploratory laparotomy 06/01/22    Stress dose steroids added 06/01/22 - weaning.   Fludrocortisone added 06/02/22, stopped on 8/4   Vanc stopped 06/02/22.    BC growing enterobacter and e coli 7/31   Currently on rocephin and flagyl.   Repeat CT on 8/3 showed Liver abscesses.    CT on 8/7 showed decreased size of abscess post drain placement.     #Liver abscess involving most of left lobe. S/p drain placement on 8/4  # Elevated troponin, likely demand ischemia with non specific EKG changes  # VT with further hemodynamic compromise 06/02/22, controlled now   S/p electric CV x 2 followed by amio bolus 150 mg x2 on 06/01/22 AM   On amio drip. EP following.    # Acute respiratory failure with hypoxemia and hypercapnia, ventilatory dependant   Intubated 06/01/22 and extubated on 8/7.    # Acute encephalopathy, likely metabolic and toxic from sepsis- improved.  # Propofol infusion toxicity/  intolerance   Rising Cpk TG and cardiac arrythmia noted 06/02/22   Propofol changed to versed 06/02/22 but now bother are off.     # Acute Kidney injury, anuric. Likely ATN   CVVHDF initiated 06/01/22 through right Fem NTC    # Multi organ failure including shock liver  # Coagulopathy, elevated PT, INR with no evidence of obvious bleeding  # Thrombocytopenia, Likely from sepsis.   Fibrinogen 651    Plan  - Dced solucortef.  - Extubated on 8/8.  -Continue aggressive pulmonary clearance.  - Abx per ID.  - Repeat CT abd on 8/3 showed liver abscesses, s/p drain placement on 8/4. CT on 8/7 showed decreased size of abscess post drain placement. FU cultures.   - Advance feeding per Gen  surgery. Bowel regimen.  - Continue HD per nephrology.    Continue care in ICU.    I personally spent --- minutes of critical care time.  This is time spent at this critically ill patient's bedside actively involved in patient care as well as the coordination of care and discussions with the patient's family.  This does not include any procedural time which has been billed separately.       CRITICAL CARE CONSULTANT NOTE  I had a face to face encounter with the patient, reviewed and interpreted patient data including clinical events, labs, images, vital signs, I/O's, and examined patient.  I have discussed the case and the plan and management of the patient's care with the consulting services, the bedside nurses and the respiratory therapist.      NOTE OF PERSONAL INVOLVEMENT IN CARE   This patient has a high probability of imminent, clinically significant deterioration, which requires the highest level of preparedness to intervene urgently. I participated in the decision-making and personally managed or directed the management of the following life and organ supporting interventions that required my frequent assessment to treat or prevent imminent deterioration.    Modena Slater, MD   [facility]  [phone]  06/09/2022      Review of systems:     Review of Systems   Unable to obtain, patient is intubated and sedated.     Objective:     Vital Signs:  BP 133/65   Pulse 77   Temp 98.6 F (37 C) (Axillary)   Resp 25   Ht 1.727 m (5\' 8" )   Wt 103.7 kg (228 lb 9.9 oz)   SpO2 98%   BMI 34.76 kg/m      Temp (24hrs), Avg:99.9 F (37.7 C), Min:98.6 F (37 C), Max:100.5 F (38.1 C)           Intake/Output:     Intake/Output Summary (Last 24 hours) at 06/09/2022 08/09/2022  Last data filed at 06/09/2022 0800  Gross per 24 hour   Intake 1535.8 ml   Output 3085 ml   Net -1549.2 ml         Physical Exam  Constitutional:       Appearance: He is ill-appearing.   HENT:      Head: Normocephalic and atraumatic.   Eyes:       Pupils: Pupils are equal, round, and reactive to light.   Cardiovascular:      Rate and Rhythm: Normal rate and regular rhythm.      Pulses: Normal pulses.      Heart sounds: Normal heart sounds.   Abdominal:      General: There is distension.      Palpations:  Abdomen is soft.   Neurological:      Mental Status: He is alert and oriented to person, place, and time.     Intubated and sedated.     Past Medical History:        has no past medical history on file.    Past Surgical History:      has a past surgical history that includes laparotomy (N/A, 06/01/2022); laparoscopy (N/A, 06/01/2022); Bladder surgery (N/A, 06/01/2022); and CT DRAINAGE VISCERAL PERCUTANEOUS (06/05/2022).      Home Medications:     Prior to Admission medications    Medication Sig Start Date End Date Taking? Authorizing Provider   metFORMIN (GLUCOPHAGE) 500 MG tablet Take 1 tablet by mouth daily  Patient not taking: Reported on 06/08/2022   Yes Historical Provider, MD   metoprolol (LOPRESSOR) 100 MG tablet Take 1 tablet by mouth daily   Yes Historical Provider, MD   hydrALAZINE (APRESOLINE) 25 MG tablet Take 1 tablet by mouth 3 times daily  Patient not taking: Reported on 06/08/2022   Yes Historical Provider, MD   atorvastatin (LIPITOR) 40 MG tablet Take 1 tablet by mouth daily  Patient not taking: Reported on 06/08/2022   Yes Historical Provider, MD   losartan (COZAAR) 100 MG tablet Take 1 tablet by mouth daily  Patient not taking: Reported on 06/08/2022   Yes Historical Provider, MD   metFORMIN (GLUCOPHAGE-XR) 500 MG extended release tablet Take 1 tablet by mouth daily (with breakfast)  Patient not taking: Reported on 06/08/2022 03/20/22   Historical Provider, MD   hydroCHLOROthiazide (HYDRODIURIL) 25 MG tablet TAKE 1 TABLET BY MOUTH ONCE DAILY AS NEEDED  Patient not taking: Reported on 06/08/2022 03/17/22   Historical Provider, MD   metoprolol succinate (TOPROL XL) 100 MG extended release tablet TAKE 1 TABLET BY MOUTH ONCE DAILY WITH OR IMMEDIATELY FOLLOWING A  MEAL 03/10/22   Historical Provider, MD         Allergies/Social/Family History:     Allergies   Allergen Reactions    Augmentin [Amoxicillin-Pot Clavulanate] Hives     Tolerated ceftriaxone 06/2022    Codeine      Unknown reaction      Social History     Tobacco Use    Smoking status: Not on file    Smokeless tobacco: Not on file   Substance Use Topics    Alcohol use: Not on file      No family history on file.    LABS AND  DATA:   Reviewed    Peak airway pressure:      Minute ventilation:

## 2022-06-09 NOTE — Progress Notes (Signed)
1910 - Bedside and Verbal shift change report given to Erskine Squibb, Charity fundraiser (oncoming nurse) by Theron Arista, RN (offgoing nurse). Report included the following information Nurse Handoff Report, Adult Overview, Intake/Output, MAR, Med Rec Status, and Cardiac Rhythm Sinus Rhythm    1930 - Assessment done and documented, see flow sheet.  Patient Oriented x3, appeared weak but follow commands.     2000 - Removed HD cath at right femoral, applied pressure and occlusive dressing.

## 2022-06-09 NOTE — Progress Notes (Signed)
EP/ ARRHYTHMIA Progress Note    Patient ID:  Patient: Jaime [name]  MRN: [id number]  Age: 76 y.o.  DOB: [birth year]    Date of  Admission: 05/31/2022 11:25 PM   PCP:  No primary care provider on file.  Usual cardiologist:  Jodelle Red, MD in [facility] [phone]    Assessment:   Sustained monomorphic VT but also with some salvoes earlier today.  Suspect this is ectopic and due to critical illness and catecholamines.  Persistent atrial fibrillation.  Moderate aortic stenosis by outside echo 2020, limited study done here on 8/1.  Sepsis with GNR (E. Coli).  Diagnostic laparoscopy, laparotomy, EGD on 7/31.  IR drain to hepatic abscesses 8/4.  Multiorgan dysfunction, shock.  Severe thrombocytopenia, improving.  Full code.    Plan:     Continue amiodarone infusion.  Can bolus freely as needed for VT.  If an additional agent is needed, consider lidocaine bolus.    Supportive care.  Can go to oral amiodarone when OK with the primary team.      [x]        High complexity decision making was performed in this patient at high risk for decompensation with multiple organ involvement.    Jaime [name] is a 76 y.o. male with a history of the above.  I was asked to consult due to recurrent VT requiring repeated amiodarone and shock attempts.     History reviewed. No pertinent past medical history.     Past Surgical History:   Procedure Laterality Date    BLADDER SURGERY N/A 06/01/2022    ENDOSCOPY OF ILEAL CONDUIT performed by 06/03/2022, MD at [facility] MAIN OR    CT VISCERAL PERCUTANEOUS DRAIN  06/05/2022    CT VISCERAL PERCUTANEOUS DRAIN 06/05/2022 [facility] RAD CT    LAPAROSCOPY N/A 06/01/2022    LAPAROSCOPY DIAGNOSTIC performed by 06/03/2022, MD at [facility]    LAPAROTOMY N/A 06/01/2022    LAPAROTOMY EXPLORATORY performed by 06/03/2022, MD at [facility]       Social History     Tobacco Use    Smoking status: Not on file    Smokeless tobacco: Not on file   Substance Use Topics    Alcohol use:  Not on file        No family history on file.     Allergies   Allergen Reactions    Augmentin [Amoxicillin-Pot Clavulanate] Hives     Tolerated ceftriaxone 06/2022    Codeine      Unknown reaction          Current Facility-Administered Medications   Medication Dose Route Frequency    polyethylene glycol (GLYCOLAX) packet 17 g  17 g Oral Daily    sennosides-docusate sodium (SENOKOT-S) 8.6-50 MG tablet 2 tablet  2 tablet Oral BID    heparin (porcine) injection 5,000 Units  5,000 Units SubCUTAneous 3 times per day    lansoprazole (PREVACID SOLUTAB) disintegrating tablet 30 mg  30 mg Per NG tube Daily    heparin (porcine) injection 1,500 Units  1,500 Units IntraCATHeter PRN    And    heparin (porcine) injection 1,500 Units  1,500 Units IntraCATHeter PRN    cefTRIAXone (ROCEPHIN) 2,000 mg in sodium chloride 0.9 % 50 mL IVPB (mini-bag)  2,000 mg IntraVENous Q24H    metronidazole (FLAGYL) 500 mg in 0.9% NaCl 100 mL IVPB premix  500 mg IntraVENous Q8H    hydrALAZINE (APRESOLINE) injection 10 mg  10 mg IntraVENous Q4H PRN  sodium chloride 0.9 % bolus 100 mL  100 mL IntraVENous PRN    insulin NPH (HumuLIN N;NovoLIN N) injection vial 15 Units  15 Units SubCUTAneous 2 times per day    fentaNYL (SUBLIMAZE) injection 50 mcg  50 mcg IntraVENous Q1H PRN    anidulafungin (ERAXIS) 100 mg in sodium chloride 0.9 % 130 mL IVPB  100 mg IntraVENous Q24H    balsum peru-castor oil (VENELEX) ointment   Topical BID    amiodarone (CORDARONE) 450 mg in dextrose 5 % 250 mL infusion (Vial2Bag)  0.5 mg/min IntraVENous Continuous    glucose chewable tablet 16 g  4 tablet Oral PRN    dextrose bolus 10% 125 mL  125 mL IntraVENous PRN    Or    dextrose bolus 10% 250 mL  250 mL IntraVENous PRN    glucagon (rDNA) injection 1 mg  1 mg SubCUTAneous PRN    dextrose 10 % infusion   IntraVENous Continuous PRN    insulin lispro (HUMALOG) injection vial 0-4 Units  0-4 Units SubCUTAneous 4 times per day    sodium chloride flush 0.9 % injection 5-40 mL  5-40 mL  IntraVENous 2 times per day    sodium chloride flush 0.9 % injection 5-40 mL  5-40 mL IntraVENous PRN    0.9 % sodium chloride infusion   IntraVENous PRN    acetaminophen (TYLENOL) tablet 650 mg  650 mg Oral Q6H PRN    Or    acetaminophen (TYLENOL) suppository 650 mg  650 mg Rectal Q6H PRN    ondansetron (ZOFRAN) injection 4 mg  4 mg IntraVENous Q6H PRN       Review of Symptoms:  He cannot communicate.     Objective:      Physical Exam:  Temp (24hrs), Avg:99.6 F (37.6 C), Min:98.6 F (37 C), Max:100.5 F (38.1 C)    Patient Vitals for the past 8 hrs:   Pulse   06/09/22 1500 84   06/09/22 1400 76   06/09/22 1300 88   06/09/22 1200 84   06/09/22 1100 80   06/09/22 1000 80   06/09/22 0900 80   06/09/22 0800 77      Patient Vitals for the past 8 hrs:   Resp   06/09/22 1500 20   06/09/22 1400 26   06/09/22 1300 (!) 31   06/09/22 1200 29   06/09/22 1100 30   06/09/22 1000 28   06/09/22 0900 28   06/09/22 0800 25      Patient Vitals for the past 8 hrs:   BP   06/09/22 1500 (!) 97/59   06/09/22 1400 (!) 103/47   06/09/22 1300 119/61   06/09/22 1200 (!) 123/50   06/09/22 1100 (!) 143/65   06/09/22 1000 135/71   06/09/22 0900 131/67   06/09/22 0800 133/65          Intake/Output Summary (Last 24 hours) at 06/09/2022 1540  Last data filed at 06/09/2022 1400  Gross per 24 hour   Intake 1557.51 ml   Output 325 ml   Net 1232.51 ml         Nondiaphoretic, not in acute distress.  Intubated and sedated.  No scleral icterus, mucous membranes moist, conjuctivae pink, no xanthelasma.  Unlabored, clear to auscultation bilaterally anteriorly, symmetric air movement.  Irregular rate and rhythm, + soft systolic murmur, no pericardial rub.  No peripheral edema.  Extremities without cyanosis or clubbing.  Muscle tone and bulk normal.  Skin warm and  dry.  No rashes or ulcers visible.  Neuro grossly nonfocal.  No tremor.  Awake and appropriate.    CARDIOGRAPHICS and STUDIES, I reviewed:    Telemetry:  Atrial fibrillation.    ECG:  Studies  reviewed from the last 24 hours.    Echo 8/1:  Left Ventricle Normal left ventricular systolic function with a visually estimated EF of 55 - 60%. Not well visualized. Left ventricle size is normal. Increased wall thickness. Unable to assess wall motion. Diastolic dysfunction present with normal LV EF.   Left Atrium Not well visualized.   Right Ventricle Not well visualized. Normal systolic function.   Right Atrium Not well visualized.   Aortic Valve Not well visualized. No regurgitation. No stenosis.   Mitral Valve Not well visualized. No regurgitation. No stenosis noted.   Tricuspid Valve Not well visualized. No regurgitation. No stenosis noted.   Pulmonic Valve The pulmonic valve was not well visualized.   Aorta Not well visualized. Normal sized sinus of Valsalva.   Pericardium No pericardial effusion.       Labs:  No results for input(s): CPK, CKMB in the last 72 hours.    Invalid input(s): CPKMB, CKNDX, TROIQ  No results found for: CHOL, CHOLX, CHLST, CHOLV, HDL, HDLC, LDL, LDLC, TGLX, TRIGL  No results for input(s): INR, APTT in the last 72 hours.    Invalid input(s): PTP     Recent Labs     06/07/22  0401 06/08/22  0402 06/09/22  0342   NA 139 138 136   K 4.2 4.3 4.6   CL 107 109* 107   CO2 27 23 21    BUN 41* 59* 59*   PHOS  --  3.0 4.5   WBC 36.1* 32.8* 30.5*   HGB 8.7* 9.1* 9.5*   HCT 25.9* 26.9* 28.6*   PLT 65* 102* 132*       Recent Labs     06/08/22  0402 06/09/22  0342   GLOB 3.2 3.6       No components found for: GLPOC  No results for input(s): PH, PCO2, PO2 in the last 72 hours.      08/09/22, MD 06/09/22 3:40 PM

## 2022-06-09 NOTE — Progress Notes (Signed)
NAME: Jaime [name]        DOB:  12/13/1945        MRN:  [id number]                     Assessment   :                                               Plan:  AKI  Sepsis  Hypotension>>HTN.  Resp failure  DM-2  Shock liver       AKI related to sepsis and shock. CT showed renal cysts.  He has poor UO (CT on 8/7 with empty bladder and no hydro).  Right femoral quinton - move when able     CVVHD started 7/31 until 8/6    Off pressors, extubated    iHD 8/7 (~ 3 liters removed); No HD today, HD tomorrow    Watch for kidney recovery - none so far             Subjective:     Chief Complaint:   alert. Denies specific complaint. We reviewed the above.  Review of Systems:    Symptom Y/N Comments  Symptom Y/N Comments   Fever/Chills    Chest Pain     Poor Appetite    Edema     Cough    Abdominal Pain     Sputum    Joint Pain     SOB/DOE    Pruritis/Rash     Nausea/vomit    Tolerating PT/OT     Diarrhea    Tolerating Diet     Constipation    Other       Could not obtain due to:      Objective:     VITALS:   Last 24hrs VS reviewed since prior progress note. Most recent are:  Vitals:    06/09/22 0400   BP: (!) 93/46   Pulse: 80   Resp: 26   Temp: 99.2 F (37.3 C)   SpO2: 99%       Intake/Output Summary (Last 24 hours) at 06/09/2022 4010  Last data filed at 06/09/2022 0400  Gross per 24 hour   Intake 1154.57 ml   Output 3175 ml   Net -2020.43 ml        Telemetry Reviewed:     PHYSICAL EXAM:  General: Intubated, alert awake  +ETT  Clear  RRR  +edema- L > R  Purple toes+      Lab Data Reviewed: (see below)    Medications Reviewed: (see below)    PMH/SH reviewed - no change compared to H&P  ________________________________________________________________________  Care Plan discussed with:  Patient     Family      RN     Care Manager                    Consultant:          Comments   >50% of visit spent in counseling and coordination of care        ________________________________________________________________________  Tanna Savoy, MD     Procedures: see electronic medical records for all procedures/Xrays and details which  were not copied into this note but were reviewed prior to creation of Plan.      LABS:  Recent Labs     06/08/22  0402  06/09/22  0342   WBC 32.8* 30.5*   HGB 9.1* 9.5*   HCT 26.9* 28.6*   PLT 102* 132*       Recent Labs     06/06/22  1628 06/07/22  0401 06/08/22  0402 06/09/22  0342   NA 138 139 138 136   K 4.0 4.2 4.3 4.6   CL 106 107 109* 107   CO2 25 27 23 21    BUN 49* 41* 59* 59*   MG 2.6*  --  2.8* 2.6*   PHOS  --   --  3.0 4.5       Recent Labs     06/08/22  0402 06/09/22  0342   GLOB 3.2 3.6       MEDICATIONS:  Current Facility-Administered Medications   Medication Dose Route Frequency    heparin (porcine) injection 5,000 Units  5,000 Units SubCUTAneous 3 times per day    lansoprazole (PREVACID SOLUTAB) disintegrating tablet 30 mg  30 mg Per NG tube Daily    heparin (porcine) injection 1,500 Units  1,500 Units IntraCATHeter PRN    And    heparin (porcine) injection 1,500 Units  1,500 Units IntraCATHeter PRN    cefTRIAXone (ROCEPHIN) 2,000 mg in sodium chloride 0.9 % 50 mL IVPB (mini-bag)  2,000 mg IntraVENous Q24H    metronidazole (FLAGYL) 500 mg in 0.9% NaCl 100 mL IVPB premix  500 mg IntraVENous Q8H    hydrALAZINE (APRESOLINE) injection 10 mg  10 mg IntraVENous Q4H PRN    sodium chloride 0.9 % bolus 100 mL  100 mL IntraVENous PRN    insulin NPH (HumuLIN N;NovoLIN N) injection vial 15 Units  15 Units SubCUTAneous 2 times per day    fentaNYL (SUBLIMAZE) injection 50 mcg  50 mcg IntraVENous Q1H PRN    anidulafungin (ERAXIS) 100 mg in sodium chloride 0.9 % 130 mL IVPB  100 mg IntraVENous Q24H    balsum peru-castor oil (VENELEX) ointment   Topical BID    amiodarone (CORDARONE) 450 mg in dextrose 5 % 250 mL infusion (Vial2Bag)  0.5 mg/min IntraVENous Continuous    glucose chewable tablet 16 g  4 tablet Oral PRN    dextrose bolus  10% 125 mL  125 mL IntraVENous PRN    Or    dextrose bolus 10% 250 mL  250 mL IntraVENous PRN    glucagon (rDNA) injection 1 mg  1 mg SubCUTAneous PRN    dextrose 10 % infusion   IntraVENous Continuous PRN    insulin lispro (HUMALOG) injection vial 0-4 Units  0-4 Units SubCUTAneous 4 times per day    sodium chloride flush 0.9 % injection 5-40 mL  5-40 mL IntraVENous 2 times per day    sodium chloride flush 0.9 % injection 5-40 mL  5-40 mL IntraVENous PRN    0.9 % sodium chloride infusion   IntraVENous PRN    acetaminophen (TYLENOL) tablet 650 mg  650 mg Oral Q6H PRN    Or    acetaminophen (TYLENOL) suppository 650 mg  650 mg Rectal Q6H PRN    ondansetron (ZOFRAN) injection 4 mg  4 mg IntraVENous Q6H PRN    chlorhexidine (PERIDEX) 0.12 % solution 15 mL  15 mL Mouth/Throat BID

## 2022-06-09 NOTE — Care Coordination-Inpatient (Addendum)
Transition of Care Plan:    RUR: 16%  Prior Level of Functioning: Independent  Disposition: TBD  If SNF or IPR: Date FOC offered: TBD  Date FOC received: TBD  Accepting facility: TBD  Date authorization started with reference number: TBD  Date authorization received and expires: TBD  Follow up appointments: To be done prior to discharge.   DME needed: None  Transportation at discharge: Family  IM/IMM Medicare/Tricare letter given: To be given prior to discharge.   Is patient a Veteran and connected with VA? No   If yes, was [employer] transfer form completed and VA notified? N/A  Caregiver Contact: Wife  Discharge Caregiver contacted prior to discharge? Caregiver to be contacted prior to discharge.   Care Conference needed? No  Barriers to discharge:  Medical stability      Patient continues to receive care in [facility]. He was extubated today and started HD on yesterday. Orders placed for physical and occupational therapy to start working with the patient.      Jonathyn Carothers C.[place]  RN BSN CRM    Case Manager    [phone]                  Nicloe Frontera C.[place]  RN BSN CRM    Case Manager    [phone]

## 2022-06-09 NOTE — Consults (Signed)
Palliative Medicine Consult  [name]    Patient Name: Jaime [name]  Date of Birth: 1946-01-12    Date of Initial Consult: 06/08/22  Date of Today's Visit: 06/09/2022  Reason for Consult: Care Decisions  Requesting Provider: Lavonia Dana, MD      SUMMARY:   Jaime [name] is a 76 y.o. with a past history of HTN, HLD, and DM2, who was admitted on 05/31/2022 from home with a diagnosis of Perforated Viscus seen on CT, Septic Shock, and AKI    Hospital Course:   7/31 to OR for an ex lap that was converted to an open laparotomy due to increasing pressor requirements.  He also had an EGD in the OR.  No obvious perf found.  Started on CVVH later that day for worsening kidney function  8/1:  V-tach s/p defibrillation x3.  3 pressors- and Susie was called with the news that he might not survive given how critically unstable he was.   8/3: down to 2 pressors now  8/4: rescanned and CT showed hepatic abscess.  IR placed a perc drain.  Only on one pressor now  8/6: following some commands  8/7:  Wakes to verbal stimulation, following commands.  For CT today. Started on iHD fro CVVH.  SBT today    8/8:  Extubated yesterday, awake, alert, interactive and appropriate today    Social: Lives in [place] with his wife Susie.  Was here for the [place] race.  Susie does not have reliable transportation to get her here to [place].     PALLIATIVE DIAGNOSES:   Pneumoperitoneum  E-coli bacteremia  Hepatic Abscess  Acute hypoxic respiratory Failure  AKI- on HD  Sepsis     PLAN:   Met with Jaime [name] at bedside- he looks good! I called his wife to offer a video chat based on her unreliable transportation, but she shared that she plans to come to [place] tomorrow.  For now goals are clear and there are no acute decisions to make at this time, so I will just be following along peripherally  Initial consult note routed to primary continuity provider and/or primary health care team members  Communicated plan of care with:  Palliative IDT, [place] Team    ADVANCE CARE PLANNING / TREATMENT PREFERENCES:     GOALS OF CARE:  [] -Comfort   [] -Cure   [] -Prolong life   [x] -Recovery from acute illness   [] -Rehabilitation  [] -Other:         Advance Care Planning:  [x]  The Pall Med Interdisciplinary Team has updated the ACP Navigator with [place] and Patient [place]     The patient has appointed the following active healthcare agents:    Primary Decision Maker (Active): Carie,SUSIE - Spouse - [phone]    The Patient has the following current code status:    Code Status: Full Code         Other:    As far as possible, the palliative care team has discussed with patient / health care proxy about goals of care / treatment preferences for patient.     HISTORY:     History obtained from: chart    CHIEF COMPLAINT: none- unable to talk to me due to intuabtion    HPI/SUBJECTIVE:    The patient is:   []  Verbal and participatory  [x]  Non-participatory due to: condition        ROS / FUNCTIONAL ASSESSMENT:  Palliative Performance Scale (PPS)  PPS: 30         Modified-[place] Symptom Assessment Scale (ESAS)  Tiredness Score: Not tired  Drowsiness Score: Not drowsy  Depression Score: Not depressed  Pain Score: No pain  Anxiety Score: 4  Nausea Score: Not nauseated  Dyspnea Score: No shortness of breath    Clinical Pain Assessment  Severity: 0    Adult Nonverbal Pain Scale (NVPS)  Face: No particular expression or smile  Activity (Movement): Laid quietly, normal position  Guarding: Lying quietly, no positioning of hands over areas of bod  Physiology (Vital Signs): Stable vital signs  Respiratory: Baseline RR/SpO2 compliant with ventilator  NVPS Score : 0          PSYCHOSOCIAL/SPIRITUAL SCREENING:     Palliative IDT has assessed this patient for cultural preferences / practices and a referral made as appropriate to needs Orthoptist, Patient Advocacy, Ethics, etc.)    Spiritual  affiliation was reviewed as documented by palliative care chaplain.      Any spiritual / religious / cultural beliefs and practices that will affect your patient's care?:  []  Yes /  [x]  No   If "Yes" to discuss with pastoral care during IDT     Does caregiver feel burdened by caring for their loved one:   []  Yes /  [x]  No /  []  No Caregiver Present/Available []  No Caregiver []  Pt Lives at Facility  If "Yes" to discuss with social work during IDT    Anticipatory grief assessment:   [x]  Normal  / []  Maladaptive     If "Maladaptive" to discuss with social work during [place]:     From Therapist, sports flowsheet:  Wt Readings from Last 3 Encounters:   06/04/22 228 lb 9.9 oz (103.7 kg)     Blood pressure 119/61, pulse 88, temperature 98.9 F (37.2 C), temperature source Axillary, resp. rate (!) 31, height 5' 8"  (1.727 m), weight 228 lb 9.9 oz (103.7 kg), SpO2 95 %.    Last bowel movement, if known:     Constitutional: awake, alert, following commands  Eyes: pupils equal, anicteric  Cardiovascular: regular rhythm, toes are purple  Respiratory: intubated  Musculoskeletal: no deformity, no tenderness to palpation  Skin: warm, dry  Neurologic: following commands, moving all extremities     HISTORY:     Principal Problem:    Gastric perforation (HCC)  Active Problems:    Type 2 diabetes mellitus with hyperglycemia, without long-term current use of insulin (HCC)    Intestinal obstruction (HCC)    Severe sepsis (HCC)    Septic shock (HCC)    Escherichia coli septicemia (HCC)    Liver abscess    Pneumoperitoneum    Acute respiratory failure with hypoxia (HCC)    AKI (acute kidney injury) ([place])    Multi-organ failure with heart failure (HCC)    Thrombocytopenia (HCC)    E coli bacteremia    Hepatic abscess    Peripheral arterial disease (HCC)    Hyperbilirubinemia    Gram negative sepsis (HCC)  Resolved Problems:    * No resolved hospital problems. *    History reviewed. No pertinent past medical history.   Past Surgical  History:   Procedure Laterality Date    BLADDER SURGERY N/A 06/01/2022    ENDOSCOPY OF ILEAL CONDUIT performed by Melene Plan, MD at [facility] MAIN OR    CT VISCERAL PERCUTANEOUS DRAIN  06/05/2022    CT VISCERAL PERCUTANEOUS  DRAIN 06/05/2022 [facility] RAD CT    LAPAROSCOPY N/A 06/01/2022    LAPAROSCOPY DIAGNOSTIC performed by Melene Plan, MD at [facility] N/A 06/01/2022    LAPAROTOMY EXPLORATORY performed by Melene Plan, MD at [facility] MAIN OR      No family history on file.   History reviewed, no pertinent family history.  Social History     Tobacco Use    Smoking status: Not on file    Smokeless tobacco: Not on file   Substance Use Topics    Alcohol use: Not on file     Allergies   Allergen Reactions    Augmentin [Amoxicillin-Pot Clavulanate] Hives     Tolerated ceftriaxone 06/2022    Codeine      Unknown reaction      Current Facility-Administered Medications   Medication Dose Route Frequency    polyethylene glycol (GLYCOLAX) packet 17 g  17 g Oral Daily    sennosides-docusate sodium (SENOKOT-S) 8.6-50 MG tablet 2 tablet  2 tablet Oral BID    heparin (porcine) injection 5,000 Units  5,000 Units SubCUTAneous 3 times per day    lansoprazole (PREVACID SOLUTAB) disintegrating tablet 30 mg  30 mg Per NG tube Daily    heparin (porcine) injection 1,500 Units  1,500 Units IntraCATHeter PRN    And    heparin (porcine) injection 1,500 Units  1,500 Units IntraCATHeter PRN    cefTRIAXone (ROCEPHIN) 2,000 mg in sodium chloride 0.9 % 50 mL IVPB (mini-bag)  2,000 mg IntraVENous Q24H    metronidazole (FLAGYL) 500 mg in 0.9% NaCl 100 mL IVPB premix  500 mg IntraVENous Q8H    hydrALAZINE (APRESOLINE) injection 10 mg  10 mg IntraVENous Q4H PRN    sodium chloride 0.9 % bolus 100 mL  100 mL IntraVENous PRN    insulin NPH (HumuLIN N;NovoLIN N) injection vial 15 Units  15 Units SubCUTAneous 2 times per day    fentaNYL (SUBLIMAZE) injection 50 mcg  50 mcg IntraVENous Q1H PRN    anidulafungin (ERAXIS) 100 mg in sodium chloride 0.9 % 130 mL IVPB   100 mg IntraVENous Q24H    balsum peru-castor oil (VENELEX) ointment   Topical BID    amiodarone (CORDARONE) 450 mg in dextrose 5 % 250 mL infusion (Vial2Bag)  0.5 mg/min IntraVENous Continuous    glucose chewable tablet 16 g  4 tablet Oral PRN    dextrose bolus 10% 125 mL  125 mL IntraVENous PRN    Or    dextrose bolus 10% 250 mL  250 mL IntraVENous PRN    glucagon (rDNA) injection 1 mg  1 mg SubCUTAneous PRN    dextrose 10 % infusion   IntraVENous Continuous PRN    insulin lispro (HUMALOG) injection vial 0-4 Units  0-4 Units SubCUTAneous 4 times per day    sodium chloride flush 0.9 % injection 5-40 mL  5-40 mL IntraVENous 2 times per day    sodium chloride flush 0.9 % injection 5-40 mL  5-40 mL IntraVENous PRN    0.9 % sodium chloride infusion   IntraVENous PRN    acetaminophen (TYLENOL) tablet 650 mg  650 mg Oral Q6H PRN    Or    acetaminophen (TYLENOL) suppository 650 mg  650 mg Rectal Q6H PRN    ondansetron (ZOFRAN) injection 4 mg  4 mg IntraVENous Q6H PRN          LAB AND IMAGING FINDINGS:     Lab Results   Component Value  Date/Time    WBC 30.5 06/09/2022 03:42 AM    HGB 9.5 06/09/2022 03:42 AM    PLT 132 06/09/2022 03:42 AM     Lab Results   Component Value Date/Time    NA 136 06/09/2022 03:42 AM    K 4.6 06/09/2022 03:42 AM    CL 107 06/09/2022 03:42 AM    CO2 21 06/09/2022 03:42 AM    BUN 59 06/09/2022 03:42 AM    MG 2.6 06/09/2022 03:42 AM    PHOS 4.5 06/09/2022 03:42 AM      Lab Results   Component Value Date/Time    GLOB 3.6 06/09/2022 03:42 AM     Lab Results   Component Value Date/Time    INR 2.0 06/02/2022 04:05 AM      No results found for: IRON, TIBC, IBCT, FERR   No results found for: PH, PCO2, PO2  No components found for: GLPOC   No results found for: CPK, CKMB, TROPONINI

## 2022-06-10 ENCOUNTER — Encounter (HOSPITAL_COMMUNITY): Payer: Self-pay

## 2022-06-10 LAB — CBC WITH AUTO DIFFERENTIAL
Absolute Immature Granulocyte: 0 10*3/uL (ref 0.00–0.04)
Band Neutrophils: 1 %
Basophils %: 0 % (ref 0–1)
Basophils Absolute: 0 10*3/uL (ref 0.0–0.1)
Eosinophils %: 0 % (ref 0–7)
Eosinophils Absolute: 0 10*3/uL (ref 0.0–0.4)
Hematocrit: 27.9 % — ABNORMAL LOW (ref 36.6–50.3)
Hemoglobin: 9.5 g/dL — ABNORMAL LOW (ref 12.1–17.0)
Immature Granulocytes: 0 % (ref 0.0–0.5)
Lymphocytes %: 4 % — ABNORMAL LOW (ref 12–49)
Lymphocytes Absolute: 1.4 10*3/uL (ref 0.8–3.5)
MCH: 29.8 PG (ref 26.0–34.0)
MCHC: 34.1 g/dL (ref 30.0–36.5)
MCV: 87.5 FL (ref 80.0–99.0)
Monocytes %: 3 % — ABNORMAL LOW (ref 5–13)
Monocytes Absolute: 1 10*3/uL (ref 0.0–1.0)
Neutrophils %: 92 % — ABNORMAL HIGH (ref 32–75)
Neutrophils Absolute: 32.5 10*3/uL — ABNORMAL HIGH (ref 1.8–8.0)
Nucleated RBCs: 0 PER 100 WBC
Platelets: 166 10*3/uL (ref 150–400)
RBC: 3.19 M/uL — ABNORMAL LOW (ref 4.10–5.70)
RDW: 17.8 % — ABNORMAL HIGH (ref 11.5–14.5)
WBC: 34.9 10*3/uL — ABNORMAL HIGH (ref 4.1–11.1)
nRBC: 0 10*3/uL (ref 0.00–0.01)

## 2022-06-10 LAB — BASIC METABOLIC PANEL
Anion Gap: 9 mmol/L (ref 5–15)
BUN: 83 MG/DL — ABNORMAL HIGH (ref 6–20)
Bun/Cre Ratio: 16 (ref 12–20)
CO2: 20 mmol/L — ABNORMAL LOW (ref 21–32)
Calcium: 7.6 MG/DL — ABNORMAL LOW (ref 8.5–10.1)
Chloride: 104 mmol/L (ref 97–108)
Creatinine: 5.24 MG/DL — ABNORMAL HIGH (ref 0.70–1.30)
Est, Glom Filt Rate: 11 mL/min/{1.73_m2} — ABNORMAL LOW (ref >60)
Glucose: 228 mg/dL — ABNORMAL HIGH (ref 65–100)
Potassium: 5.3 mmol/L — ABNORMAL HIGH (ref 3.5–5.1)
Sodium: 133 mmol/L — ABNORMAL LOW (ref 136–145)

## 2022-06-10 LAB — POCT GLUCOSE
POC Glucose: 170 mg/dL — ABNORMAL HIGH (ref 65–117)
POC Glucose: 190 mg/dL — ABNORMAL HIGH (ref 65–117)
POC Glucose: 145 mg/dL — ABNORMAL HIGH (ref 65–117)
POC Glucose: 156 mg/dL — ABNORMAL HIGH (ref 65–117)

## 2022-06-10 LAB — PHOSPHORUS: Phosphorus: 6.1 MG/DL — ABNORMAL HIGH (ref 2.6–4.7)

## 2022-06-10 LAB — MAGNESIUM: Magnesium: 2.8 mg/dL — ABNORMAL HIGH (ref 1.6–2.4)

## 2022-06-10 LAB — PROCALCITONIN: Procalcitonin: 14.45 ng/mL

## 2022-06-10 MED ORDER — HEPARIN (PORCINE) 1000 UNITS/ML INJECTION FOR DIALYSIS CATHETER LOCK
1000 UNIT/ML | INTRAMUSCULAR | Status: AC | PRN
Start: 2022-06-10 — End: 2022-07-06
  Administered 2022-06-12 – 2022-06-29 (×5): 1000 [IU]

## 2022-06-10 MED ORDER — AMIODARONE HCL IN DEXTROSE 150-4.21 MG/100ML-% IV SOLN
150-4.21 | INTRAVENOUS | Status: DC | PRN
Start: 2022-06-10 — End: 2022-06-12

## 2022-06-10 MED ORDER — ALBUMIN HUMAN 25 % IV SOLN
25 % | INTRAVENOUS | Status: DC | PRN
Start: 2022-06-10 — End: 2022-08-03
  Administered 2022-06-10 – 2022-06-13 (×4): 25 g via INTRAVENOUS

## 2022-06-10 MED ORDER — AMIODARONE HCL 200 MG PO TABS
200 | Freq: Every day | ORAL | Status: DC
Start: 2022-06-10 — End: 2022-06-18
  Administered 2022-06-10 – 2022-06-17 (×7): 400 mg via ORAL

## 2022-06-10 MED ORDER — INSULIN NPH (HUMAN) (ISOPHANE) 100 UNIT/ML SC SUSP
100 UNIT/ML | Freq: Two times a day (BID) | SUBCUTANEOUS | Status: AC
Start: 2022-06-10 — End: 2022-06-11
  Administered 2022-06-10 – 2022-06-11 (×2): 16 [IU] via SUBCUTANEOUS

## 2022-06-10 MED ORDER — HEPARIN SODIUM (PORCINE) 1000 UNIT/ML IJ SOLN
1000 UNIT/ML | INTRAMUSCULAR | Status: DC | PRN
Start: 2022-06-10 — End: 2022-07-06
  Administered 2022-06-12 – 2022-06-29 (×5): 1200 [IU]

## 2022-06-10 MED FILL — ALBUTEIN 25 % IV SOLN: 25 % | INTRAVENOUS | Qty: 100

## 2022-06-10 MED FILL — SENNA PLUS 8.6-50 MG PO TABS: ORAL | Qty: 2

## 2022-06-10 MED FILL — HUMULIN N 100 UNIT/ML SC SUSP: 100 UNIT/ML | SUBCUTANEOUS | Qty: 1

## 2022-06-10 MED FILL — HEPARIN SODIUM (PORCINE) 1000 UNIT/ML IJ SOLN: 1000 UNIT/ML | INTRAMUSCULAR | Qty: 1

## 2022-06-10 MED FILL — AMIODARONE HCL 450 MG/9ML IV SOLN: 450 MG/9ML | INTRAVENOUS | Qty: 9

## 2022-06-10 MED FILL — LANSOPRAZOLE 30 MG PO TBDD: 30 MG | ORAL | Qty: 1

## 2022-06-10 MED FILL — METRONIDAZOLE 500 MG/100ML IV SOLN: 500 MG/100ML | INTRAVENOUS | Qty: 100

## 2022-06-10 MED FILL — CEFTRIAXONE SODIUM 2 G IJ SOLR: 2 g | INTRAMUSCULAR | Qty: 2000

## 2022-06-10 MED FILL — ERAXIS 100 MG IV SOLR: 100 MG | INTRAVENOUS | Qty: 30

## 2022-06-10 MED FILL — HEPARIN SODIUM (PORCINE) 5000 UNIT/ML IJ SOLN: 5000 UNIT/ML | INTRAMUSCULAR | Qty: 1

## 2022-06-10 MED FILL — HEPARIN SODIUM (PORCINE) 1000 UNIT/ML IJ SOLN: 1000 UNIT/ML | INTRAMUSCULAR | Qty: 2

## 2022-06-10 MED FILL — AMIODARONE HCL 200 MG PO TABS: 200 MG | ORAL | Qty: 2

## 2022-06-10 MED FILL — HEALTHYLAX 17 G PO PACK: 17 g | ORAL | Qty: 1

## 2022-06-10 NOTE — Progress Notes (Signed)
NAME: Jaime [name]        DOB:  1946/01/13        MRN:  [id number]                     Assessment   :                                               Plan:  AKI  Sepsis  Hypotension>>HTN.  Resp failure  DM-2  Shock liver       AKI related to sepsis and shock. CT showed renal cysts.  He has poor UO (CT on 8/7 with empty bladder and no hydro).  Right IJ quinton 8/8     CVVHD started 7/31 until 8/6; iHD 8/7 (~ 3 liters removed); HD today with UF as tolerated (needs volume off if able)    Watch for kidney recovery - none so far             Subjective:     Chief Complaint:   alert. Denies specific complaint. We reviewed the above. The patient was seen on dialysis at 10:48 AM .  BP is stable. Catheter is functioning well.    Review of Systems:    Symptom Y/N Comments  Symptom Y/N Comments   Fever/Chills    Chest Pain     Poor Appetite    Edema     Cough    Abdominal Pain     Sputum    Joint Pain     SOB/DOE    Pruritis/Rash     Nausea/vomit    Tolerating PT/OT     Diarrhea    Tolerating Diet     Constipation    Other       Could not obtain due to:      Objective:     VITALS:   Last 24hrs VS reviewed since prior progress note. Most recent are:  Vitals:    06/10/22 0500   BP: 126/60   Pulse: 83   Resp: 25   Temp:    SpO2: 95%       Intake/Output Summary (Last 24 hours) at 06/10/2022 0636  Last data filed at 06/10/2022 0400  Gross per 24 hour   Intake 1036.54 ml   Output 365 ml   Net 671.54 ml        Telemetry Reviewed:     PHYSICAL EXAM:  General: Intubated, alert awake  +ETT  Clear  RRR  +edema- L > R  Purple toes+      Lab Data Reviewed: (see below)    Medications Reviewed: (see below)    PMH/SH reviewed - no change compared to H&P  ________________________________________________________________________  Care Plan discussed with:  Patient     Family      RN     Care Manager                    Consultant:          Comments   >50% of visit spent in  counseling and coordination of care       ________________________________________________________________________  Tanna Savoy, MD     Procedures: see electronic medical records for all procedures/Xrays and details which  were not copied into this note but were reviewed prior to creation of Plan.      LABS:  Recent Labs     06/09/22  0342 06/10/22  0358   WBC 30.5* 34.9*   HGB 9.5* 9.5*   HCT 28.6* 27.9*   PLT 132* 166       Recent Labs     06/08/22  0402 06/09/22  0342 06/10/22  0358   NA 138 136 133*   K 4.3 4.6 5.3*   CL 109* 107 104   CO2 23 21 20*   BUN 59* 59* 83*   MG 2.8* 2.6* 2.8*   PHOS 3.0 4.5 6.1*       Recent Labs     06/08/22  0402 06/09/22  0342   GLOB 3.2 3.6       MEDICATIONS:  Current Facility-Administered Medications   Medication Dose Route Frequency    polyethylene glycol (GLYCOLAX) packet 17 g  17 g Oral Daily    sennosides-docusate sodium (SENOKOT-S) 8.6-50 MG tablet 2 tablet  2 tablet Oral BID    heparin (porcine) injection 5,000 Units  5,000 Units SubCUTAneous 3 times per day    lansoprazole (PREVACID SOLUTAB) disintegrating tablet 30 mg  30 mg Per NG tube Daily    heparin (porcine) injection 1,500 Units  1,500 Units IntraCATHeter PRN    And    heparin (porcine) injection 1,500 Units  1,500 Units IntraCATHeter PRN    cefTRIAXone (ROCEPHIN) 2,000 mg in sodium chloride 0.9 % 50 mL IVPB (mini-bag)  2,000 mg IntraVENous Q24H    metronidazole (FLAGYL) 500 mg in 0.9% NaCl 100 mL IVPB premix  500 mg IntraVENous Q8H    hydrALAZINE (APRESOLINE) injection 10 mg  10 mg IntraVENous Q4H PRN    sodium chloride 0.9 % bolus 100 mL  100 mL IntraVENous PRN    insulin NPH (HumuLIN N;NovoLIN N) injection vial 15 Units  15 Units SubCUTAneous 2 times per day    fentaNYL (SUBLIMAZE) injection 50 mcg  50 mcg IntraVENous Q1H PRN    anidulafungin (ERAXIS) 100 mg in sodium chloride 0.9 % 130 mL IVPB  100 mg IntraVENous Q24H    balsum peru-castor oil (VENELEX) ointment   Topical BID    amiodarone (CORDARONE) 450 mg in  dextrose 5 % 250 mL infusion (Vial2Bag)  0.5 mg/min IntraVENous Continuous    glucose chewable tablet 16 g  4 tablet Oral PRN    dextrose bolus 10% 125 mL  125 mL IntraVENous PRN    Or    dextrose bolus 10% 250 mL  250 mL IntraVENous PRN    glucagon (rDNA) injection 1 mg  1 mg SubCUTAneous PRN    dextrose 10 % infusion   IntraVENous Continuous PRN    insulin lispro (HUMALOG) injection vial 0-4 Units  0-4 Units SubCUTAneous 4 times per day    sodium chloride flush 0.9 % injection 5-40 mL  5-40 mL IntraVENous 2 times per day    sodium chloride flush 0.9 % injection 5-40 mL  5-40 mL IntraVENous PRN    0.9 % sodium chloride infusion   IntraVENous PRN    acetaminophen (TYLENOL) tablet 650 mg  650 mg Oral Q6H PRN    Or    acetaminophen (TYLENOL) suppository 650 mg  650 mg Rectal Q6H PRN    ondansetron (ZOFRAN) injection 4 mg  4 mg IntraVENous Q6H PRN

## 2022-06-10 NOTE — Progress Notes (Signed)
Vascular Surgery Progress Note  Liz Beach ACNP-BC      Date:06/10/2022       Room:2513/01  Patient Name:Jaime [name]     Date of Birth:05-03-1946     Age:76 y.o.    Subjective      Zay Yeargan is a very pleasant obese 76 y.o. male with a pmhx significant for DM, HTN, HLD, HFpEF, AS, and CKD. He is a non-smoker. He was admitted to the hospital with septic shock secondary to a suspected perforated viscus w/ enterobacteriaceae and ecoli bacteremia.  He is s/p emergent exploratory laparotomy 06/01/22.  A CT scan of the abdomen was significant for liver abscesses and he status post drainage with IR.  He is status post cardioversion x2 for multiple episodes of ventricular tachycardia.      This am he is extubated.  He is following commands and appropriate in conversation.  He continues to have an NG tube and is wearing oxygen.  He continues on intermittent hemodialysis. His urinary output is improving. His leukocytosis has peaked and is now trending down.  His thrombocytopenia is slowly improving.      He had a prolonged period of hypoperfusion of the peripheral extremities.  He continues to have cyanosis of the bilateral toes.  They are minimally improved from yesterday.  ABIs continue to be pending.    Objective           Vitals Last 24 Hours:  TEMPERATURE:  Temp  Avg: 98.7 F (37.1 C)  Min: 98 F (36.7 C)  Max: 100 F (37.8 C)  RESPIRATIONS RANGE: Resp  Avg: 26  Min: 20  Max: 31  PULSE OXIMETRY RANGE: SpO2  Avg: 95.5 %  Min: 91 %  Max: 99 %  PULSE RANGE: Pulse  Avg: 83.8  Min: 76  Max: 90  BLOOD PRESSURE RANGE: Systolic (24hrs), Avg:116 , Min:93 , Max:133   ; Diastolic (24hrs), Avg:54, Min:42, Max:66    I/O (24Hr):    Intake/Output Summary (Last 24 hours) at 06/10/2022 1227  Last data filed at 06/10/2022 1006  Gross per 24 hour   Intake 616.9 ml   Output 400 ml   Net 216.9 ml       Objective:  General Appearance:  Comfortable (sleeping this am).    Vital signs: (most recent): Blood pressure (!) 118/49, pulse  88, temperature 98 F (36.7 C), resp. rate 23, height 1.727 m (5\' 8" ), weight 107.7 kg (237 lb 7 oz), SpO2 99 %.  No fever.  (He is mildly tachypneic without respiratory distress he continues to wear oxygen).    Output: Minimal urine output.    HEENT: (NGT  Right IJ Quinton)    Lungs:  Normal effort and tachypnea.    Heart: Normal rate.  Irregular rhythm.    Abdomen: Abdomen is soft and non-distended.  (Abdominal dressing).  (Mildly tender as expected).     Extremities: Normal range of motion.    Pulses: (Palpable right leg distal pulses. Left leg distal pulses are absent.  Feet are warm with persistent cyanosis of the toes. )    Neurological: Patient is alert.  (He is oriented to place and self.).    Skin:  Pale.      Labs    Labs:  CBC:  Recent Labs     06/08/22  0402 06/09/22  0342 06/10/22  0358   WBC 32.8* 30.5* 34.9*   RBC 3.05* 3.22* 3.19*   HGB 9.1* 9.5* 9.5*   HCT 26.9* 28.6*  27.9*   MCV 88.2 88.8 87.5   RDW 15.6* 17.4* 17.8*   PLT 102* 132* 166       CHEMISTRIES:  Recent Labs     06/08/22  0402 06/09/22  0342 06/10/22  0358   NA 138 136 133*   K 4.3 4.6 5.3*   CL 109* 107 104   CO2 23 21 20*   BUN 59* 59* 83*   CREATININE 3.35* 3.85* 5.24*   GLUCOSE 143* 183* 228*   PHOS 3.0 4.5 6.1*   MG 2.8* 2.6* 2.8*         Assessment//Plan           Cyanosis of the bilateral toes  -Secondary to prolonged hypoperfusion due to septic shock  Diabetic neuropathy   -ABI NC with absent TBIs bilaterally  Vascular surgery will continue to see patient intermittently.  Please call with any urgent vascular issues.    Perforated Viscus  -managed by Dr. Lenard Forth  Large left lobe Ecoli hepatic abscess   -with other areas of probable abscess/phlegmonous infection scattered throughout the right lobe liver  -s/p IR drainage  Enterobacteriaceae and Ecoli septicemia   Septic shock/Lactic acidosis  -Leukocytosis has peaked and is now trending down.  Thrombocytopenia is improving.  Weaned off vasopressor support.  Shock liver  -with  Hyperbilirubinemia, Thrombocytopenia (PLT count improving), and Coagulopathy (INR 2.0)  Hypoalbuminemia       Acute hypoxic respiratory failure  -Intubated 7/31-to-06/08/22     Subendocardial ischemia  -Troponin peaked at 173  -Likely demand ischemia  Ventricular tachycardia  -electric CV x 2   Persistent atrial fibrillation  -amio drip   Bilateral pleural effusions  Acute on chronic grade 1 diastolic congestive heart failure  Moderate aortic stenosis  History of hypertension  Mixed hyperlipidemia   -triglycerides 1118  Cardiology following     Acute kidney injury  Oliguria  -Urinary output minimal. Intermittent hemodialysis continues.  Chronic renal disease stage 3b  -baseline creatinine 1.7  Anemia of chronic disease  -Improving  Hypermagnesemia  Elevated CK  -Peaked at 1876.  Now trending down.  Nephrology following     Acute encephalopathy   -Resolving    Diabetes mellitus  -Controlled  Obesity      Management of comorbid conditions by CC team.     VTE Prophylaxis:  SCDs contraindicated in diffuse poor perfusion  Subcu per primary team     Disposition:  TBD-after patient is able to tolerate physical therapy    Electronically signed by Liz Beach ACNP-BC on 06/09/22 at 11:28 AM EDT

## 2022-06-10 NOTE — Progress Notes (Addendum)
EP/ ARRHYTHMIA Progress Note    Patient ID:  Patient: Jaime [name]  MRN: [id number]  Age: 76 y.o.  DOB: 04/12/1946    Date of  Admission: 05/31/2022 11:25 PM   PCP:  No primary care provider on file.  Usual cardiologist:  Jodelle Red, MD in [facility] [phone]    Assessment:   Sustained monomorphic VT but also with some salvoes earlier this admission when he was quite sick.  Suspect this is ectopic and due to critical illness and catecholamines.  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  Moderate aortic stenosis by outside echo 2020, limited study done here on 8/1.  Sepsis with GNR (E. Coli).  Diagnostic laparoscopy, laparotomy, EGD on 7/31.  IR drain to hepatic abscesses 8/4.  Multiorgan dysfunction, shock.  Improving.  Severe thrombocytopenia, improving.  Full code.    Plan:     Continue amiodarone.    Supportive care.  His ability to have VT should be dramatically reduced.      [x]        High complexity decision making was performed in this patient at high risk for decompensation with multiple organ involvement.    Jaime [name] is a 76 y.o. male with a history of the above.  I was asked to consult due to recurrent VT requiring repeated amiodarone and shock attempts.     History reviewed. No pertinent past medical history.     Past Surgical History:   Procedure Laterality Date    BLADDER SURGERY N/A 06/01/2022    ENDOSCOPY OF ILEAL CONDUIT performed by 06/03/2022, MD at [facility]    CT VISCERAL PERCUTANEOUS DRAIN  06/05/2022    CT VISCERAL PERCUTANEOUS DRAIN 06/05/2022 [facility] RAD CT    LAPAROSCOPY N/A 06/01/2022    LAPAROSCOPY DIAGNOSTIC performed by 06/03/2022, MD at [facility]    LAPAROTOMY N/A 06/01/2022    LAPAROTOMY EXPLORATORY performed by 06/03/2022, MD at [facility]       Social History     Tobacco Use    Smoking status: Not on file    Smokeless tobacco: Not on file   Substance Use Topics    Alcohol use: Not on file        No family history on file.      Allergies   Allergen Reactions    Augmentin [Amoxicillin-Pot Clavulanate] Hives     Tolerated ceftriaxone 06/2022    Codeine      Unknown reaction          Current Facility-Administered Medications   Medication Dose Route Frequency    insulin NPH (HumuLIN N;NovoLIN N) injection vial 16 Units  16 Units SubCUTAneous 2 times per day    albumin human 25% IV solution 25 g  25 g IntraVENous PRN    amiodarone (CORDARONE) tablet 400 mg  400 mg Oral Daily    amiodarone (NEXTERONE) 150 mg in dextrose 5% 100 ml  150 mg IntraVENous PRN    heparin (porcine) 1000 UNIT/ML injection 1,200 Units  1,200 Units IntraCATHeter PRN    And    heparin (porcine) 1000 UNIT/ML injection 1,000 Units  1,000 Units IntraCATHeter PRN    polyethylene glycol (GLYCOLAX) packet 17 g  17 g Oral Daily    sennosides-docusate sodium (SENOKOT-S) 8.6-50 MG tablet 2 tablet  2 tablet Oral BID    heparin (porcine) injection 5,000 Units  5,000 Units SubCUTAneous 3 times per day    lansoprazole (PREVACID SOLUTAB) disintegrating tablet 30 mg  30 mg Per NG tube Daily    heparin (porcine) injection 1,500 Units  1,500 Units IntraCATHeter PRN    And    heparin (porcine) injection 1,500 Units  1,500 Units IntraCATHeter PRN    cefTRIAXone (ROCEPHIN) 2,000 mg in sodium chloride 0.9 % 50 mL IVPB (mini-bag)  2,000 mg IntraVENous Q24H    metronidazole (FLAGYL) 500 mg in 0.9% NaCl 100 mL IVPB premix  500 mg IntraVENous Q8H    hydrALAZINE (APRESOLINE) injection 10 mg  10 mg IntraVENous Q4H PRN    sodium chloride 0.9 % bolus 100 mL  100 mL IntraVENous PRN    fentaNYL (SUBLIMAZE) injection 50 mcg  50 mcg IntraVENous Q1H PRN    balsum peru-castor oil (VENELEX) ointment   Topical BID    glucose chewable tablet 16 g  4 tablet Oral PRN    dextrose bolus 10% 125 mL  125 mL IntraVENous PRN    Or    dextrose bolus 10% 250 mL  250 mL IntraVENous PRN    glucagon (rDNA) injection 1 mg  1 mg SubCUTAneous PRN    dextrose 10 % infusion   IntraVENous Continuous PRN    insulin lispro  (HUMALOG) injection vial 0-4 Units  0-4 Units SubCUTAneous 4 times per day    sodium chloride flush 0.9 % injection 5-40 mL  5-40 mL IntraVENous 2 times per day    sodium chloride flush 0.9 % injection 5-40 mL  5-40 mL IntraVENous PRN    0.9 % sodium chloride infusion   IntraVENous PRN    acetaminophen (TYLENOL) tablet 650 mg  650 mg Oral Q6H PRN    Or    acetaminophen (TYLENOL) suppository 650 mg  650 mg Rectal Q6H PRN    ondansetron (ZOFRAN) injection 4 mg  4 mg IntraVENous Q6H PRN       Review of Symptoms:  He cannot communicate.     Objective:      Physical Exam:  Temp (24hrs), Avg:98.9 F (37.2 C), Min:98 F (36.7 C), Max:100 F (37.8 C)    Patient Vitals for the past 8 hrs:   Pulse   06/10/22 1600 94   06/10/22 1500 95   06/10/22 1400 96   06/10/22 1345 96   06/10/22 1330 95   06/10/22 1315 93   06/10/22 1300 93   06/10/22 1230 90   06/10/22 1215 89   06/10/22 1200 88   06/10/22 1145 88   06/10/22 1130 86   06/10/22 1115 85   06/10/22 1100 85   06/10/22 1045 85   06/10/22 1030 83   06/10/22 1015 86   06/10/22 1000 77   06/10/22 0955 78      Patient Vitals for the past 8 hrs:   Resp   06/10/22 1600 26   06/10/22 1500 25   06/10/22 1400 22   06/10/22 1300 22   06/10/22 1230 23   06/10/22 1200 22   06/10/22 1130 23   06/10/22 0955 20      Patient Vitals for the past 8 hrs:   BP   06/10/22 1600 137/62   06/10/22 1500 (!) 117/57   06/10/22 1433 125/60   06/10/22 1400 (!) 125/50   06/10/22 1345 (!) 99/55   06/10/22 1330 107/63   06/10/22 1315 (!) 127/58   06/10/22 1300 (!) 120/58   06/10/22 1230 (!) 112/57   06/10/22 1215 (!) 111/57   06/10/22 1200 (!) 115/51   06/10/22 1145 (!) 118/49  06/10/22 1130 (!) 111/59   06/10/22 1115 (!) 107/51   06/10/22 1100 (!) 116/51   06/10/22 1045 (!) 104/51   06/10/22 1030 (!) 93/42   06/10/22 1015 (!) 117/48   06/10/22 1000 (!) 124/50   06/10/22 0955 (!) 118/54          Intake/Output Summary (Last 24 hours) at 06/10/2022 1741  Last data filed at 06/10/2022 1600  Gross per 24 hour    Intake 1607.3 ml   Output 3395 ml   Net -1787.7 ml         Nondiaphoretic, not in acute distress.  Unlabored, clear to auscultation bilaterally anteriorly, symmetric air movement.  Regular rate and rhythm, + soft systolic murmur, no pericardial rub.  No peripheral edema.  Extremities without cyanosis or clubbing.  Muscle tone and bulk normal for age.  Skin warm and dry.  No rashes or ulcers visible.  Neuro grossly nonfocal.  No tremor.  Awake and appropriate.    CARDIOGRAPHICS and STUDIES, I reviewed:    Telemetry:  SINUS RHYTHM.    ECG:  Studies reviewed from the last 24 hours.    Echo 8/1:  Left Ventricle Normal left ventricular systolic function with a visually estimated EF of 55 - 60%. Not well visualized. Left ventricle size is normal. Increased wall thickness. Unable to assess wall motion. Diastolic dysfunction present with normal LV EF.   Left Atrium Not well visualized.   Right Ventricle Not well visualized. Normal systolic function.   Right Atrium Not well visualized.   Aortic Valve Not well visualized. No regurgitation. No stenosis.   Mitral Valve Not well visualized. No regurgitation. No stenosis noted.   Tricuspid Valve Not well visualized. No regurgitation. No stenosis noted.   Pulmonic Valve The pulmonic valve was not well visualized.   Aorta Not well visualized. Normal sized sinus of Valsalva.   Pericardium No pericardial effusion.       Labs:  No results for input(s): CPK, CKMB in the last 72 hours.    Invalid input(s): CPKMB, CKNDX, TROIQ  No results found for: CHOL, CHOLX, CHLST, CHOLV, HDL, HDLC, LDL, LDLC, TGLX, TRIGL  No results for input(s): INR, APTT in the last 72 hours.    Invalid input(s): PTP     Recent Labs     06/08/22  0402 06/09/22  0342 06/10/22  0358   NA 138 136 133*   K 4.3 4.6 5.3*   CL 109* 107 104   CO2 23 21 20*   BUN 59* 59* 83*   PHOS 3.0 4.5 6.1*   WBC 32.8* 30.5* 34.9*   HGB 9.1* 9.5* 9.5*   HCT 26.9* 28.6* 27.9*   PLT 102* 132* 166       Recent Labs     06/08/22  0402  06/09/22  0342   GLOB 3.2 3.6       No components found for: GLPOC  No results for input(s): PH, PCO2, PO2 in the last 72 hours.      Tamera Punt, MD 06/10/22 5:41 PM

## 2022-06-10 NOTE — Other (Signed)
[facility]  DIABETES MANAGEMENT CONSULT    Consulted by Provider for advanced nursing evaluation and care for inpatient blood glucose management.    Evaluation and Action Plan   This 76 year old Caucasian male was admitted with RUQ pain and underwent laparotomy 06/01/22. Septic shock with multi-organ failure. Several episodes of Vtach. Remains on vent support, pressors & CRRT. There is a new finding of liver abscess on CT. There is concern for LLE due to diminished pedal pulse. Per vascular, he is not a candidate for endovascular or surgical revascularization at this time. AB index is planned.    As for BG management in this patient with known Type 2 diabetes, began low dose basal insulin 06/02/22 along with corrective insulin to address impact of steroid. Bgs had been 170-180s but continued to rise. Insulin adjusted on Friday 06/05/22. HC stopped 06/07/22. Bgs in target. State of nutrition strategy in flux. If patient passes swallow, TF may be switched to oral route for nutrition. In the meantime, TF rate continues at 30cc/hr. Bgs abit higher than desired. Will increase slightly this morning. Potential change in nutrition strategy.    Management Rationale Action Plan   Medication   Overriding steroid effect Based on BG pattern    TF running @30cc /hr with goal rate of 40cc/hr (209 CHO/D)   Increase NPH insulin to 16 units twice daily     Additional orders               Initial Presentation   Jaime [name] is a 76 y.o. male admitted 05/31/22 from ER after experiencing RUQ pain associated with generalized weakness, nausea & vomiting for several days PTA. AMS+. Hypotension+. Fever+. Dyspneic+. He is visiting from 06/02/22.  Ectopic atrial tachycardia  ER exam:  Cardiovascular:      Rate and Rhythm: Regular rhythm. Tachycardia present.   Pulmonary:      Comments: He is tachypneic, coarse breath sounds on inspiration at bilateral bases  Abdominal:      Palpations: Abdomen is soft.       Comments: Tender to palpation diffusely in the right sided abdomen without guarding or rebound tenderness   LAB: WBC 16.6. Normal H&H. Low platelets.BG 272/AG 17. AKI. Elevated liver enzymes. NT pro-BNP 20,980. Lipase >3000.     CXR:   Right IJ catheter satisfactory position without pneumothorax. ET   tube and NG tube as above   CT Head: Negative  CT ABd:   Extraluminal air bubbles in the right upper quadrant concerning for gastric   perforation. Inflammatory changes are noted about the tail of the pancreas   extending into the left anterior pararenal space, please correlate with any   evidence of acute pancreatitis. Bilateral lower lobe infiltrates.     [place] reviewed. No pertinent past medical history.     INITIAL DX: Septicemia (HCC) [A41.9]  Gastric perforation (HCC) [K25.5]  Perforated abdominal viscus [R19.8]     Current Treatment     TX: 06/01/22 LAPAROTOMY EXPLORATORY  LAPAROSCOPY DIAGNOSTIC ENDOSCOPY OF ILEAL CONDUIT  EGD    Hospital Course   Clinical progress has been complicated by multi-organ failure.   06/01/22 Dialysis  06/02/22 In septic shock with multi-organ failure. On vent support, sedation, pressor support and bicarb infusion. On Abx. Two runs of VT this morning requiring defibrillation; cardiology conversation anticipated. CRRT+  06/03/22 Remains on vent support C vent Fi02 50%/Peep 7. Low grade fever; on Abx. Steroids+. Afib/bradycardic. Pressors+. CRRT+.   06/04/22 Remains  on vent support and pressors. On Abx. Concern for LLE. CRRT. CT: Enlarged left hepatic abscess contain gas with other areas of probable abscess/phlegmonous infection.  06/05/22 On AC vent support. On Amio, vaso, levo & Fentanyl infusions. OG draining green fluid. CRRT+.  Vascular: Patient remains at risk for progressive deterioration and limb loss but needs to be weaned from pressor support before intervention is safe.  06/08/22 On Spon vent support; plans to extubate after CT today. Fentanyl pushes. H&H low. CRRT+. Mottled & blue  toes bilaterally. Vascular AKI study to be completed  06/09/22 Alert. 02NC. Amio infusion+. Pedal pulses returning (no need to use doppler)  06/10/22 Alert. 02NC. NSR. Off pressors. TF running. Swallow eval today => may begin CL. Undergoing hemodialysis now. Continued cyanosis of bilateral toes.    Diabetes History   Type 2 diabetes on metformin therapy    Diabetes-related Medical History deferred    Diabetes Medication History - based on PTA list  Drug class Currently in use   Biguanide [x]  Metformin (Glucophage)  []  Metformin ER (Glucophage XL)     Diabetes self-management practices: deferred  Overall evaluation:    [x]  Unknown A1c     Subjective   NA     Objective   Physical exam  General Obese male  Neuro  Alert  Vital Signs   Vitals:    06/10/22 0900   BP: (!) 130/49   Pulse: 77   Resp: 22   Temp:    SpO2: 97%     Extremities No foot wounds    Diabetic foot exam:    Left Foot     Visual Exam: Cool & dry. Thickened toenails    Pulse DP: +1   Right Foot   Visual Exam: Cool & dry. Thickened toenails    Pulse DP: +1      Laboratory  Recent Labs     06/08/22  0402 06/09/22  0342 06/10/22  0358   WBC 32.8* 30.5* 34.9*   HGB 9.1* 9.5* 9.5*   HCT 26.9* 28.6* 27.9*   MCV 88.2 88.8 87.5   PLT 102* 132* 166       Recent Labs     06/08/22  0402 06/09/22  0342 06/10/22  0358   NA 138 136 133*   K 4.3 4.6 5.3*   CL 109* 107 104   CO2 23 21 20*   PHOS 3.0 4.5 6.1*   BUN 59* 59* 83*   CREATININE 3.35* 3.85* 5.24*       Lab Results   Component Value Date    ALT 162 (H) 06/09/2022    AST 142 (H) 06/09/2022    ALKPHOS 215 (H) 06/09/2022    BILITOT 9.0 (H) 06/09/2022     No results found for: TSH, TSHREFLEX, TSHFT4, TSHELE, TSH3GEN, TSHHS   Lab Results   Component Value Date    LABA1C 7.7 (H) 06/02/2022     Factors impacting BG management  Factor Dose Comments   Nutrition:  TF   40cc/hr   (209gms CHO)   Running @30cc /hr now. Will undergo swallow eval   Pain Fent PRN    Infection Eraxis Q24 hrs  Rocephin Q24 hrs  Flagyl Q8 hrs WBCs  rose   Other:   Kidney function  Liver function   AKI => dialysis  Liver enzymes elevated      Blood glucose pattern      Significant diabetes-related events over the past 24-72 hours  05/31/22 Admission BG 233  06/01/22 Dex 4mg  => BG 100s => HC started  06/02/22 Bgs into 200s  06/03/22 Bgs in 170-180s  06/04/22 Bgs rising abit. HC reduced frequency today. Trickle feeds to begin  06/05/22 Bgs rising into 200s  06/08/22 Bgs in target  06/09/22 Bgs rising with start of TF at higher rate  06/10/22 Bgs rose abit with TF @30cc /hr    Assessment and Nursing Intervention   Nursing Diagnosis Risk for unstable blood glucose pattern   Nursing Intervention Domain 5250 Decision-making Support   Nursing Interventions Examined current inpatient diabetes/blood glucose control   Explored factors facilitating and impeding inpatient management  Explored corrective strategies with patient and responsible inpatient provider   Informed patient of rational for insulin strategy while hospitalized     Billing Code(s)   []  99233 IP subsequent hospital care - 50 minutes   [x]  99232 IP subsequent hospital care - 35 minutes   []  99231 IP subsequent hospital care - 25 minutes   []  99221 IP initial hospital care - 40 minutes     Before making these care recommendations, I personally reviewed the hospitalization record, including notes, laboratory & diagnostic data and current medications, and examined the patient at the bedside (circumstances permitting) before determining care. More than fifty (50) percent of the time was spent in patient counseling and/or care coordination.  Total minutes: 35    08/10/22, APRN - CNS  Clinical Nurse Specialist - Diabetes & endocrine disorders  [facility]  Access via [employer]

## 2022-06-10 NOTE — Progress Notes (Signed)
Orders received, chart reviewed. Pt currently receiving HD at the bedside. Will defer however continue to follow. Thank you    Demetrio Lapping, PT , DPT

## 2022-06-10 NOTE — Progress Notes (Signed)
Infectious Disease Progress    Impression    Gram negative sepsis  S/p Septic shock  Multiorgan failure    E. coli bacteremia  Blood cultures 7/31+ for E. coli   2/2 LAC (pan sensitive)   Cultures 8/4 negative    Acute abdomen  Pneumoperitoneum  S/p diagnostic laparoscopy, laparotomy  EGD 7/31  Findings of cloudy fluid in right upper quadrant, yellow/white  Inflammatory peel over lesser curve of stomach, inferior lobe of liver  No gross perforation identified  Pancreas and retroperitoneum appeared edematous  Intra-Op fluid culture + for E. Coli (pansensitive)    Hepatic abscess  Gas and fluid containing collection 7.1 x 10.8 x 5.4 cm  In anterior left lobe of liver, patchy areas of hypodensity right lobe  4 x 4.5 x 5.5 cm collection  S/p  drainage by IR 8/4.Cultures + for  few E.coli  Repeat CT shows reduction in size of hepatic abscess    Acute hypoxic respiratory failure  Intubated 7/31, s/p extubation 8/7    AKI  Cr 2.39, 4.6 on admission  On CV VH since 7/31  Currently on HD    Multiorgan failure  Coagulopathy    Thrombocytopenia  improving      Transaminitis  Improved    Hyperbilirubinemia  Bilirubin 8.5    Diabetes type 2  Hyperglycemia  A1c 7.7    Obesity  BMI 34.76      Plan  Continue Ceftriaxone, Flagyl IV  S/p DC of Vancomycin by pharmacy  Will continue to follow.    Extensive review of chart notes, labs, imaging, cultures done  Additionally review of done:  D/w ICU team  Ming Mcmannis  is a 76 y/o male PMHx of HTN, HLD and T2DM admitted 7/31 for septic shock in the setting of perforated viscus.  Presented to ED via EMS c/o approximately 2 days of severe abdominal pain, nausea and vomiting. He was in town from [place] for the [employer]  ED work- up revealed  B/P 80/42, HR 112, Afebrile, Leukocytosis 16.6, lactic acidosis 10,  AKI, Cr 4.06. CT A/P demonstrated perforated viscous. Received 2L LR, started on Levophed, Vancomycin/Zosyn. Surgery consulted & to OR for emergent exploratory laparotomy.   Did not reveal perforation but fluid collection in the right upper quadrant.  Cultures were sent and back with E. coli pansensitive.  Blood cultures done on 7/31+ for pansensitive E. coli 2/2 LAC.  Patient transferred to ICU post op early 7/31.  Increasing pressor needs, shock liver, thrombocytopenia, coagulopathic, anuric AKI with developing hyperkalemia and started on CHHF. Stress dose steroids and thiamine added for refractory shock. Abx changed to Vanc and merrem.   On 8/1 patient developed VT episodes with pulse, shocked x 3 and amio boluses 150 mg each x 3 fallowed by amio drip. Cardio consulted.  On 8/3  He was intubated and sedated and 2 pressors.   Patient remains intubated and sedated today.  He has been found found to have liver abscesses on CT abd, plan to place drain today by IR.  Patient is on lower dose of pressors.  Antimicrobial therapy  to date  Cefepime x 1 dose         7/31  Zosyn  x 1 dose              7/31  Vancomycin x 1 dose     7/31  Metronidazole                 7/31-8/2  and 8/3-8/4  Ceftriaxone                     8/2-8/4  Eraxis                            7/31- 8/4        Pt seen today.extubated & awake.  Undergoing HD  Discoloration of toes noted  D/w ICU Team    History reviewed. No pertinent past medical history.    Past Surgical History:   Procedure Laterality Date    BLADDER SURGERY N/A 06/01/2022    ENDOSCOPY OF ILEAL CONDUIT performed by Guinevere Ferrari, MD at [facility]    CT VISCERAL PERCUTANEOUS DRAIN  06/05/2022    CT VISCERAL PERCUTANEOUS DRAIN 06/05/2022 [facility] RAD CT    LAPAROSCOPY N/A 06/01/2022    LAPAROSCOPY DIAGNOSTIC performed by Guinevere Ferrari, MD at [facility]    LAPAROTOMY N/A 06/01/2022    LAPAROTOMY EXPLORATORY performed by Guinevere Ferrari, MD at [facility]       Allergies   Allergen Reactions    Augmentin [Amoxicillin-Pot Clavulanate] Hives     Tolerated ceftriaxone 06/2022    Codeine      Unknown reaction       Social Connections: Not on file       No family status information  on file.           Review of Systems - Negative except those mentioned in H&P      PHYSICAL EXAM:  General:          Awake, in no distress  EENT:              EOMI. Anicteric sclerae. MMM  Resp:               CTA bilaterally, no wheezing or rales.  No accessory muscle use  CV:                  Regular  rhythm,  No edema  GI:                   Soft, Non distended, Non tender.  +Bowel sounds  Neurologic:      Alert and oriented X 3, normal speech,   Psych:             Good insight. Not anxious nor agitated  Skin:                No rashes.  No jaundice.  Extremities :  No edema, discoloration of  toes++.     Waymon Amato, MD Jerrel Ivory

## 2022-06-10 NOTE — Plan of Care (Signed)
Speech LAnguage Pathology EVALUATION    Patient: Jaime [name] 76 y.o. male)  Date: 06/10/2022  Primary Diagnosis: Septicemia (HCC) [A41.9]  Gastric perforation (HCC) [K25.5]  Perforated abdominal viscus [R19.8]  Procedure(s) (LRB):  LAPAROTOMY EXPLORATORY (N/A)  LAPAROSCOPY DIAGNOSTIC (N/A)  ENDOSCOPY OF ILEAL CONDUIT (N/A) 9 Days Post-Op   Precautions: Aspiration, NPO                  ASSESSMENT :  Based on the objective data described below, the patient presents with mild oral dysphagia and increased risk for aspiration related to acute intubation history (7/31-8/7) and subsequent dysphonia. Discussed with RN who reported patient failed [employer] Screen. Vocal quality observed to be breathy, low volume; mild-moderately hoarse, and intermittently wet prior to PO intake. Patient observed with consistent throat clearing and intermittent weak cough following single ice chip trials, occasionally requiring Yankauer suctioning.    Education provided to patient and patient's family related to role of SLP, increased risk for aspiration, and SLP POC. Consider NPO with NGT for primary nutrition/hydration/medication. Single ice chips OK after oral care for patient comfort and to assist with clearing suspected secretions. SLP will follow acutely.    Patient will benefit from skilled intervention to address the above impairments.     PLAN :  Recommendations and Planned Interventions:  Diet: NPO with NGT for primary nutrition/hydration/medication  -- Strict oral care 2-3x/day with suction toothbrush  -- Single ice chips OK after oral care  -- SLP will follow acutely       Acute SLP Services: Yes, patient will be followed by speech-language pathology 3x/week to address goals. Patient's rehabilitation potential is considered to be Fair.    Discharge Recommendations: Continue to assess pending progress     SUBJECTIVE:   Patient stated, "let's hold off."    OBJECTIVE:   History reviewed. No pertinent past medical history.  Past  Surgical History:   Procedure Laterality Date    BLADDER SURGERY N/A 06/01/2022    ENDOSCOPY OF ILEAL CONDUIT performed by Guinevere Ferrari, MD at [facility]    CT VISCERAL PERCUTANEOUS DRAIN  06/05/2022    CT VISCERAL PERCUTANEOUS DRAIN 06/05/2022 [facility] RAD CT    LAPAROSCOPY N/A 06/01/2022    LAPAROSCOPY DIAGNOSTIC performed by Guinevere Ferrari, MD at [facility]    LAPAROTOMY N/A 06/01/2022    LAPAROTOMY EXPLORATORY performed by Guinevere Ferrari, MD at [facility]     Prior Level of Function/Home Situation:   Social/Functional History  Lives With: Spouse  Type of Home: House  Home Layout: One level  Home Equipment: None  ADL Assistance: Independent  Ambulation Assistance: Independent  Transfer Assistance: Independent  Active Driver: Yes    Baseline Assessment:     Current Liquid Diet : Full  Prior Dysphagia History: Patient denies prior history of dysphagia       Cognitive and Communication Status:  Neurologic State: Alert  Orientation Level: Oriented to person and Oriented to place  Cognition: Follows commands    Dysphagia:  Oral Assessment:  Oral Motor   Labial: No impairment  Dentition: Natural;Limited  Oral Hygiene: Dried secretions (?Dried blood)  Lingual: Decreased rate  Velum: Unable to visualize  Mandible: No impairment  P.O. Trials:  PO Trials  Assessment Method(s): Observation  Patient Position: Upright in bed  Vocal Quality: Wet;Breathy;Hoarse;Low volume  Consistency Presented: Ice Chips  How Presented: SLP-fed/Presented;Straw  Bolus Acceptance: No impairment  Bolus Formation/Control: Impaired  Type of Impairment: Delayed  Propulsion: Delayed (#  of seconds)  Aspiration Signs/Symptoms: Throat clear;Weak cough;Change of vocal quality            Voice:   Vocal Quality: Breathy; Low volume; Mild-moderately hoarse; Intermittently wet    Respiratory Status/Airway:  Nasal cannula, 2LPM                         The NOMS functional outcome measure was used to quantify this patient's level of swallowing impairment. Based on the NOMS,  the patient was determined to be at level 1 for swallow function.     NOMS Swallowing Levels:  Level 1 (CN): NPO  Level 2 (CM): NPO but takes consistency in therapy  Level 3 (CL): Takes less than 50% of nutrition p.o. and continues with nonoral feedings; and/or safe with mod cues; and/or max diet restriction  Level 4 (CK): Safe swallow but needs mod cues; and/or mod diet restriction; and/or still requires some nonoral feeding/supplements  Level 5 (CJ): Safe swallow with min diet restriction; and/or needs min cues  Level 6 (CI): Independent with p.o.; rare cues; usually self cues; may need to avoid some foods or needs extra time  Level 7 (CH): Independent for all p.o.  ASHA. (2003). National Outcomes Measurement System (NOMS): Adult Speech-Language Pathology User's Guide.     After treatment:   Patient left in no apparent distress in bed, Call bell left within reach, Nursing notified, and Caregiver present    COMMUNICATION/EDUCATION:   Patient was educated regarding role of SLP and SLP POC. He demonstrated Good understanding as evidenced by Nodding.    The patient's plan of care including recommendations, planned interventions, and recommended diet changes were discussed with: Registered nurse    Patient/family have participated as able in goal setting and plan of care and Patient/family agree to work toward stated goals and plan of care    Thank you,  Katy Apo, SLP  Minutes: 15     Problem: SLP Adult - Impaired Swallowing  Goal: By Discharge: Advance to least restrictive diet without signs or symptoms of aspiration for planned discharge setting.  See evaluation for individualized goals.  Description: Speech Pathology Goals  Initiated 06/10/2022    1. Patient will participate in re-evaluation of swallow function within 7 days.  Outcome: Progressing

## 2022-06-10 NOTE — Progress Notes (Deleted)
Palliative Medicine  Jaime [name] is a 76 y.o. with a past history of HTN, HLD, and DM2, who was admitted on 05/31/2022 from home with a diagnosis of Perforated Viscus seen on CT, Septic Shock, and AKI    Code Status: Full Code    Advance Care Planning:  Demographics 06/08/2022   Marital Status Married       Patient / Family Encounter Documentation    Participants (names): spouse Jaime [name],    Narrative:     Psychosocial Issues Identified/ Resilience Factors:     Caregiver Burden:   Does the caregiver feel confident administering medication?   Does the caregiver need any help connecting with community resources?   Does the caregiver feel confident assisting with activities of daily living?     Goals of Care / Plan:     Thank you for including Palliative Medicine in the care of Mr. Jaime [name].    Jaime Leatherwood, LCSW  [phone])

## 2022-06-10 NOTE — Other (Signed)
06/10/22 1400   Vital Signs   BP (!) 125/50   Pulse 96   Respirations 22   SpO2 98 %   Pain Assessment   Pain Assessment None - Denies Pain   Post-Hemodialysis Assessment   Post-Treatment Procedures Blood returned;Catheter capped, clamped and heparinized x 2 ports   Machine Disinfection Process Acid/Vinegar Clean;Heat Disinfect;Exterior Engineer, agricultural Volume (ml) 300 ml   Blood Volume Processed (Liters) 76 l/min   Dialyzer Clearance Clear   Duration of Treatment (minutes) 240 minutes   Hemodialysis Intake (ml) 500 ml   Hemodialysis Output (ml) 3000 ml   NET Removed (ml) 2500   Tolerated Treatment Good   Bilateral Breath Sounds Diminished   Edema Generalized   Edema Generalized +1   RUE Edema +1   LUE Edema +1   RLE Edema +1   LLE Edema +1   Time Off 1400     Primary RN SBAR: Joellyn Rued RN  Comments: tolerated well

## 2022-06-10 NOTE — Other (Signed)
06/10/22 0955   Vital Signs   BP (!) 118/54   Temp 98 F (36.7 C)   Pulse 78   Respirations 20   SpO2 98 %   Weight - Scale 107.7 kg (237 lb 7 oz)   Percent Weight Change 3.86   Pain Assessment   Pain Assessment None - Denies Pain   Observations & Evaluations   Level of Consciousness 0   Oriented X 4   Heart Rhythm Regular   Respiratory Quality/Effort Labored   O2 Device Nasal cannula   Bilateral Breath Sounds Diminished   Skin Color Jaundice   Skin Condition/Temp Clammy   Abdomen Inspection Obese;Distended   Bowel Sounds (All Quadrants) Absent   Edema Generalized   Edema Generalized +1   RUE Edema +2   LUE Edema +2   RLE Edema +2   LLE Edema +2   Technical Checks   Dialysis Machine No. [id number]   RO Machine Number [id number]   Dialyzer Lot No. [id number]   Tubing Lot Number [id number]   All Connections Secure Yes   NS Bag Yes   Saline Line Double Clamped Yes   Dialyzer Revaclear 300   Prime Volume (mL) 200 mL   ICEBOAT I;C;E;B;O;A;T   RO Machine Log Sheet Completed Yes   Machine Alarm Self Test Completed;Passed   Child psychotherapist Function   Extracorporeal Chemical engineer Conductivity 13.9   Manual Conductivity 14   Manual Ph 7.3   Bleach Test (Neg) Yes   Bath Temperature 96.8 F (36 C)   Treatment Initiation   Dialyze Hours 3.5   Treatment  Initiation Lines secured to patient;Connections secured;Universal Precautions maintained;Prime given;Venous Parameters set;Arterial Parameters set;Air foam detector engaged   Dialysis Bath   K+ (Potassium) 2   Ca+ (Calcium) 2.5   Na+ (Sodium) 138   HCO3 (Bicarb) 30   Hemodialysis Central Access Right Neck   Placement Date/Time: 06/09/22 1830   Present on Admission/Arrival: No  Inserted by: dr. Rutherford Limerick  Orientation: Right  Access Location: Neck   Continued need for line? Yes   Site Assessment Clean, dry & intact   CVC Lumen Status Capped   Venous Lumen Status Blood return noted   Arterial Lumen Status Blood return noted   Dressing Type Sterile  dressing, transparent   Date of Last Dressing Change 06/09/22   Dressing Status Clean, dry & intact   Primary RN SBAR: Joellyn Rued RN  Patient Education: yes, r/t dialysis process  Hepatitis B Surface Ag   Date/Time Value Ref Range Status   06/01/2022 10:08 PM <0.10 Index Final     Hep B S Ab   Date/Time Value Ref Range Status   06/01/2022 10:08 PM <3.10 mIU/mL Final

## 2022-06-10 NOTE — Progress Notes (Signed)
Admit Date: 05/31/2022    POD 9 Days Post-Op    Procedure:  Procedure(s):  LAPAROTOMY EXPLORATORY  LAPAROSCOPY DIAGNOSTIC  ENDOSCOPY OF ILEAL CONDUIT    Subjective:     Patient extubated, no c/o.  + BM. Tol Tfs at 30 cc/hr    Objective:     Blood pressure (!) 130/49, pulse 77, temperature 98 F (36.7 C), temperature source Oral, resp. rate 22, height 5\' 8"  (1.727 m), weight 228 lb 9.9 oz (103.7 kg), SpO2 97 %.    Temp (24hrs), Avg:98.9 F (37.2 C), Min:98 F (36.7 C), Max:100 F (37.8 C)      Physical Exam:  GENERAL: appears stated age, LUNG: clear to auscultation bilaterally, HEART: regular rate and rhythm, ABDOMEN: soft, distended, wound c/d/I, JP drainage serous appearing today, pigtail abscess drain sanguinous, EXTREMITIES:  extremities normal, atraumatic, no cyanosis or edema    Labs:   Recent Results (from the past 24 hour(s))   POCT Glucose    Collection Time: 06/09/22 12:18 PM   Result Value Ref Range    POC Glucose 162 (H) 65 - 117 mg/dL    Performed by: 08/09/22 RN    Vascular ankle brachial index (ABI)    Collection Time: 06/09/22  1:46 PM   Result Value Ref Range    Left arm BP 111 mmHg    Left posterior tibial 138 mmHg    Left dorsalis pedis BP 139 mmHg    Left toe pressure 0 mmHg    Right posterior tibial >240 mmHg    Right dorsalis pedis BP 146 mmHg    Right toe pressure 0 mmHg    Body Surface Area 2.21 m2    Right ABI 1.32     Right TBI 0.00     Left ABI 1.25     Left TBI 0.00    POCT Glucose    Collection Time: 06/09/22  6:31 PM   Result Value Ref Range    POC Glucose 204 (H) 65 - 117 mg/dL    Performed by: 08/09/22 RN    POCT Glucose    Collection Time: 06/10/22 12:39 AM   Result Value Ref Range    POC Glucose 170 (H) 65 - 117 mg/dL    Performed by: 08/10/22 RN    Basic Metabolic Panel    Collection Time: 06/10/22  3:58 AM   Result Value Ref Range    Sodium 133 (L) 136 - 145 mmol/L    Potassium 5.3 (H) 3.5 - 5.1 mmol/L    Chloride 104 97 - 108 mmol/L    CO2 20 (L) 21 - 32  mmol/L    Anion Gap 9 5 - 15 mmol/L    Glucose 228 (H) 65 - 100 mg/dL    BUN 83 (H) 6 - 20 MG/DL    Creatinine 08/10/22 (H) 0.70 - 1.30 MG/DL    Bun/Cre Ratio 16 12 - 20      Est, Glom Filt Rate 11 (L) >60 ml/min/1.21m2    Calcium 7.6 (L) 8.5 - 10.1 MG/DL   Magnesium    Collection Time: 06/10/22  3:58 AM   Result Value Ref Range    Magnesium 2.8 (H) 1.6 - 2.4 mg/dL   Phosphorus    Collection Time: 06/10/22  3:58 AM   Result Value Ref Range    Phosphorus 6.1 (H) 2.6 - 4.7 MG/DL   CBC with Auto Differential    Collection Time: 06/10/22  3:58 AM   Result  Value Ref Range    WBC 34.9 (H) 4.1 - 11.1 K/uL    RBC 3.19 (L) 4.10 - 5.70 M/uL    Hemoglobin 9.5 (L) 12.1 - 17.0 g/dL    Hematocrit 13.2 (L) 36.6 - 50.3 %    MCV 87.5 80.0 - 99.0 FL    MCH 29.8 26.0 - 34.0 PG    MCHC 34.1 30.0 - 36.5 g/dL    RDW 44.0 (H) 10.2 - 14.5 %    Platelets 166 150 - 400 K/uL    Nucleated RBCs 0.0 0 PER 100 WBC    nRBC 0.00 0.00 - 0.01 K/uL    Neutrophils % 92 (H) 32 - 75 %    Band Neutrophils 1 %    Lymphocytes % 4 (L) 12 - 49 %    Monocytes % 3 (L) 5 - 13 %    Eosinophils % 0 0 - 7 %    Basophils % 0 0 - 1 %    Immature Granulocytes 0 0.0 - 0.5 %    Neutrophils Absolute 32.5 (H) 1.8 - 8.0 K/UL    Lymphocytes Absolute 1.4 0.8 - 3.5 K/UL    Monocytes Absolute 1.0 0.0 - 1.0 K/UL    Eosinophils Absolute 0.0 0.0 - 0.4 K/UL    Basophils Absolute 0.0 0.0 - 0.1 K/UL    Absolute Immature Granulocyte 0.0 0.00 - 0.04 K/UL    Differential Type MANUAL      RBC Comment ANISOCYTOSIS  1+       POCT Glucose    Collection Time: 06/10/22  5:54 AM   Result Value Ref Range    POC Glucose 190 (H) 65 - 117 mg/dL    Performed by: Karlene Einstein RN        Data Review images and reports reviewed    Assessment:     Principal Problem:    Gastric perforation (HCC)  Active Problems:    Type 2 diabetes mellitus with hyperglycemia, without long-term current use of insulin (HCC)    Intestinal obstruction (HCC)    Severe sepsis (HCC)    Septic shock (HCC)    Escherichia coli  septicemia (HCC)    Liver abscess    Pneumoperitoneum    Acute respiratory failure with hypoxia (HCC)    AKI (acute kidney injury) (HCC)    Multi-organ failure with heart failure (HCC)    Thrombocytopenia (HCC)    E coli bacteremia    Hepatic abscess    Peripheral arterial disease (HCC)    Hyperbilirubinemia    Gram negative sepsis (HCC)    Septicemia (HCC)  Resolved Problems:    * No resolved hospital problems. *      Plan/Recommendations/Medical Decision Making:     Okay for vent weaning and extubation  Now on HD  E coli in hepatic drain cxs  Continue supportive care per intensivist service.  CT looked good, decreased hepatic abscess collection  Speech eval and if okay, stop tube feeds and resume po full liquid diet  JP drains out once taking po well    Noemi Chapel, MD  [facility]

## 2022-06-10 NOTE — Plan of Care (Signed)
Problem: Occupational Therapy - Adult  Goal: By Discharge: Performs self-care activities at highest level of function for planned discharge setting.  See evaluation for individualized goals.  Description: FUNCTIONAL STATUS PRIOR TO ADMISSION:     , ADL Assistance: Independent,  ,  ,  ,  ,  ,  , Ambulation Assistance: Independent, Transfer Assistance: Independent, Active Driver: Yes     HOME SUPPORT: Patient lived with spouse and was independent.    Occupational Therapy Goals:  Initiated 06/10/2022  1.  Patient will perform grooming at bed level with Moderate Assist within 7 day(s).  2.  Patient will perform self-feeding with Moderate Assist within 7 day(s).  3.  Patient will perform upper body dressing with Maximal Assist within 7 day(s).  4.  Patient will complete supine>sit with mod assist x2  within 7 day(s).  5.  Patient will tolerate sitting EOB with fair sitting balance in preparation for ADLs within 7 day(s).      Outcome: Progressing     OCCUPATIONAL THERAPY EVALUATION    Patient: Jaime [name] (76 y.o. male)  Date: 06/10/2022  Primary Diagnosis: Septicemia (HCC) [A41.9]  Gastric perforation (HCC) [K25.5]  Perforated abdominal viscus [R19.8]  Procedure(s) (LRB):  LAPAROTOMY EXPLORATORY (N/A)  LAPAROSCOPY DIAGNOSTIC (N/A)  ENDOSCOPY OF ILEAL CONDUIT (N/A) 9 Days Post-Op     Precautions: NPO                  ASSESSMENT :  The patient is limited by decreased functional mobility, independence in ADLs, high-level IADLs, ROM, strength, body mechanics, activity tolerance, endurance, coordination, balance, posture, fine-motor control.    Based on the impairments listed above pt is presenting well below his independent baseline, currently requiring up to total assist for ADLs and max-total assist x2 for functional mobility. Pt presents with severe BUE and BLE edema, unable to utilize BUE functionally at this time. Pt tolerated OT evaluation well and was able to sit EOB with x2 assist. Recommend IPR.    Functional  Outcome Measure:  The patient scored 6/24 on the AMPAC outcome measure.         PLAN :  Recommendations and Planned Interventions:   self care training, therapeutic activities, functional mobility training, balance training, therapeutic exercise, neuromuscular re-education, endurance activities, patient education, and home safety training    Frequency/Duration: OT Plan of Care: 5 times/week    Recommendation for discharge: (in order for the patient to meet his/her long term goals): Therapy 3 hours/day 5-7 days/week    Other factors to consider for discharge: patient's current support system is unable to meet their requirements for physical assistance, high risk for falls, not safe to be alone, and concern for safely navigating or managing the home environment    IF patient discharges home will need the following DME: continuing to assess with progress       SUBJECTIVE:   Patient stated, "Don't hurt yourself."    OBJECTIVE DATA SUMMARY:   History reviewed. No pertinent past medical history.  Past Surgical History:   Procedure Laterality Date    BLADDER SURGERY N/A 06/01/2022    ENDOSCOPY OF ILEAL CONDUIT performed by Guinevere Ferrari, MD at [facility]    CT VISCERAL PERCUTANEOUS DRAIN  06/05/2022    CT VISCERAL PERCUTANEOUS DRAIN 06/05/2022 [facility] RAD CT    LAPAROSCOPY N/A 06/01/2022    LAPAROSCOPY DIAGNOSTIC performed by Guinevere Ferrari, MD at [facility]    LAPAROTOMY N/A 06/01/2022    LAPAROTOMY EXPLORATORY performed by  Guinevere Ferrari, MD at [facility]          Expanded or extensive additional review of patient history:   Lives With: Spouse  Type of Home: House  Home Layout: One level                       Home Equipment: None           Hand Dominance: right     EXAMINATION OF PERFORMANCE DEFICITS:    Cognitive/Behavioral Status:  Orientation  Overall Orientation Status: Within Normal Limits  Orientation Level: Oriented to place;Oriented to situation;Oriented to person (did not assess time orientation)  Cognition  Overall  Cognitive Status: WNL    Skin: ecchymosis B feet; NG tube    Edema: severe BUEs and moderate BLEs    Hearing:   Hearing  Hearing: Within functional limits    Vision/Perceptual:          Vision  Vision: Within Engineer, technical sales  Overall Perceptual Status: WFL    Range of Motion:   AROM: Grossly decreased, non-functional  PROM: Grossly decreased, non-functional      Strength:  Strength: Grossly decreased, non-functional      Coordination:  Coordination: Grossly decreased, non-functional            Tone & Sensation:   Tone: Normal  Sensation: Intact          Functional Mobility and Transfers for ADLs:    Bed Mobility:     Bed Mobility Training  Bed Mobility Training: Yes  Rolling: Maximum assistance;Assist X2  Supine to Sit: Maximum assistance;Assist X2  Sit to Supine: Maximum assistance;Assist X2  Scooting: Maximum assistance;Assist X2    Transfers:     Art therapist: No            Balance:     Balance  Sitting: Impaired  Sitting - Static: Poor (constant support)  Sitting - Dynamic: Poor (constant support)        ADL Assessment:          Feeding: NPO       Grooming: Dependent/Total       UE Bathing: Dependent/Total       LE Bathing: Dependent/Total       UE Dressing: Dependent/Total       LE Dressing: Dependent/Total       Toileting: Dependent/Total              ADL Intervention and task modifications:    Educated pt on benefits of attempted AROM of BUE and BLE to tolerance to facilitate improved strength and motor coordination of extremities.          [employer] AM-PACTM "6 Clicks"                                                       Daily Activity Inpatient Short Form  How much help from another person does the patient currently need... Total; A Lot A Little None   1.  Putting on and taking off regular lower body clothing? [x]   1 []   2 []   3 []   4   2.  Bathing (including washing, rinsing, drying)? [x]   1 []   2 []   3 []   4   3.  Toileting, which  includes using toilet,  bedpan or urinal? [x]  1 []   2 []   3 []   4   4.  Putting on and taking off regular upper body clothing? [x]   1 []   2 []   3 []   4   5.  Taking care of personal grooming such as brushing teeth? [x]   1 []   2 []   3 []   4   6.  Eating meals? [x]   1 []   2 []   3 []   4    2007, Trustees of , under license to [place], [place]. All rights reserved     Score: 6/24     Interpretation of Tool:  Represents clinically-significant functional categories (i.e. Activities of daily living).    Cutoff score 39.4 (19) correlates to a good likelihood of discharging home versus a facility  Diane U. , , , . [place], . , AM-PAC "6-Clicks" Functional Assessment Scores Predict Acute Care Hospital Discharge Destination, Physical Therapy, Volume 94, Issue 9, 03 July 2013, Pages [phone],    Pain Rating:  0/10   Pain Intervention(s):       Activity Tolerance:   Fair     After treatment:   Patient left in no apparent distress in bed, Call bell within reach, Caregiver / family present, and Side rails x3    COMMUNICATION/EDUCATION:   The patient's plan of care was discussed with: physical therapist and registered nurse    Patient Education  Education Given To: Patient  Education Provided: Role of Therapy;Plan of  Education Method: Verbal  Barriers to Learning: None  Education Outcome: Continued education needed;Verbalized understanding    Thank you for this referral.  , OT  Minutes: 25    Occupational Therapy Evaluation Charge Determination   History Examination Decision-Making   HIGH Complexity : Extensive review of history including physical, cognitive and psychocial history  HIGH Complexity: 5 Performance deficits relating to physical, cognitive, or psychosocial skills that result in activity limitations and/or participation restrictions  HIGH Complexity: Patient presents with  comorbidities that affect occupational performance.  Significant modifications of tasks or assistance (eg. physical or verbal) with assessment (s) is necessary to enable pt to complete evaluation   Based on the above components, the patient evaluation is determined to be of the following complexity level: High

## 2022-06-10 NOTE — Progress Notes (Addendum)
0700 Bedside and Verbal shift change report given to Irving Burton, RN (oncoming nurse) by Erskine Squibb, RN (offgoing nurse). Report included the following information Nurse Handoff Report.     0800 Shift assessment completed. Patient alert to self w/ continuous confusion/delirium?, follows commands, on 2L NC, anuric, and the plan is to have HD at  bedside today    Patient full bed bath completed    0945 HD RN at bedside    IDR at bedside and plan is to:  -Continue HD  -Bedside swallow evaluation-will hopefully be able to tolerate liquids so the NG tube can be pulled.   -Wife on way from NC to visit    1200 Reassessment completed. No new changes    1400 HD completed. 2.5L pulled off patient. BP 125/60    1425 Patient failed RN bedside swallow evaluation. Speech consult placed. Patient had coughing with sips water    1430 PT/OT at bedside    1550 Speech therapy at bedside    1600 Reassessment completed. No new changes  Speech therapy recommended once ice chips and continue TF. Speech told RN they will reassess patient tomorrow     1630 New gown  placed on patient d/t drain sites oozing serous fluid. Sites cleaned, dried, and re-dressed with ABD pads and medipore tape     1900 Bedside and Verbal shift change report given to Gerarda Gunther, Charity fundraiser (Cabin crew) by Irving Burton, RN (offgoing nurse). Report included the following information Nurse Handoff Report.

## 2022-06-10 NOTE — Plan of Care (Addendum)
Problem: Physical Therapy - Adult  Goal: By Discharge: Performs mobility at highest level of function for planned discharge setting.  See evaluation for individualized goals.  Description: FUNCTIONAL STATUS PRIOR TO ADMISSION: Patient was independent and active without use of DME. Drove himself to [place] from [place] for NASCAR race. Denies home O2 use.     HOME SUPPORT PRIOR TO ADMISSION: The patient lived with wife however wife has her own medical issues and cannot provide physical assist.    Physical Therapy Goals  Initiated 06/10/2022  1.  Patient will move from supine to sit and sit to supine, scoot up and down, and roll side to side in bed with contact guard assist within 7 day(s).   2. Patient will sit EOB x 10 minutes with supervision/set-up assist within 7 days.    2.  Patient will perform sit to stand with moderate assistance x2 within 7 day(s).  3.  Patient will transfer from bed to chair and chair to bed with moderate assistance x2 using the least restrictive device within 7 day(s).  4.  Patient will ambulate with moderate assistance x2 for 5 feet with the least restrictive device within 7 day(s).   Outcome: Progressing   PHYSICAL THERAPY EVALUATION    Patient: Jaime [name] (76 y.o. male)  Date: 06/10/2022  Primary Diagnosis: Septicemia (HCC) [A41.9]  Gastric perforation (HCC) [K25.5]  Perforated abdominal viscus [R19.8]  Procedure(s) (LRB):  LAPAROTOMY EXPLORATORY (N/A)  LAPAROSCOPY DIAGNOSTIC (N/A)  ENDOSCOPY OF ILEAL CONDUIT (N/A) 9 Days Post-Op   Precautions:                      ASSESSMENT :   DEFICITS/IMPAIRMENTS:   The patient presents with grossly decreased strength, significant BUE and BLE edema, poor sitting balance, impaired activity tolerance, and overall impaired functional mobility. Pt agreeable and motivated throughout therapy session. Pt assumed seated position EOB w/ maxAx2 and maintained seated position EOB x5-6 minutes before fatigue. Pt required minA to achieve and maintain  midline sitting, demonstrating increased trunk sway in all planes. VSS throughout. Pt is functioning well below his baseline independent level, most appropriate for intensive rehab at discharge.     Patient will benefit from skilled intervention to address the above impairments.    Functional Outcome Measure:  The patient scored 6/24 on the AMPAC outcome measure which is indicative of increased likelihood that pt will require SNF at discharge.           PLAN :  Recommendations and Planned Interventions:   bed mobility training, transfer training, gait training, therapeutic exercises, patient and family training/education, and therapeutic activities    Frequency/Duration: Patient will be followed by physical therapy to address goals, PT Plan of Care: 5 times/week to address goals.    Recommendation for discharge: (in order for the patient to meet his/her long term goals): Therapy 3 hours/day 5-7 days/week    Other factors to consider for discharge:  independent prior, prolonged/complicated hospital course, caregiver burden, falls risk    IF patient discharges home will need the following DME: continuing to assess with progress       SUBJECTIVE:   Patient stated "I never made it to the [place] race."    OBJECTIVE DATA SUMMARY:       History reviewed. No pertinent past medical history.  Past Surgical History:   Procedure Laterality Date    BLADDER SURGERY N/A 06/01/2022    ENDOSCOPY OF ILEAL CONDUIT performed by Guinevere Ferrari, MD at  [facility] MAIN OR    CT VISCERAL PERCUTANEOUS DRAIN  06/05/2022    CT VISCERAL PERCUTANEOUS DRAIN 06/05/2022 [facility] RAD CT    LAPAROSCOPY N/A 06/01/2022    LAPAROSCOPY DIAGNOSTIC performed by Guinevere Ferrari, MD at [facility]    LAPAROTOMY N/A 06/01/2022    LAPAROTOMY EXPLORATORY performed by Guinevere Ferrari, MD at [facility]       Home Situation:  Social/Functional History  Lives With: Spouse  Type of Home: House  Home Layout: One level  Home Equipment: None  Active Driver: Yes    Cognitive/Behavioral Status:           Skin: dressings clean, dry, intact    Edema: BUEs, BLEs    Hearing:        Vision/Perceptual:                  Strength:         Tone & Sensation:           Coordination:       Range Of Motion:          Functional Mobility:  Bed Mobility:     Bed Mobility Training  Bed Mobility Training: Yes  Rolling: Maximum assistance;Assist X2  Supine to Sit: Maximum assistance;Assist X2  Sit to Supine: Maximum assistance;Assist X2  Scooting: Maximum assistance;Assist X2  Transfers:     Transfer Training  Transfer Training: No  Balance:               Balance  Sitting: Impaired  Sitting - Static: Poor (constant support)  Sitting - Dynamic: Poor (constant support)  Ambulation/Gait Training:                                                                                                                                                                                                                                                                            [employer] AM-PAC      Basic Mobility Inpatient Short Form (6-Clicks) Version 2  How much HELP from another person do you currently need... (If the patient hasn't done an activity recently, how much help from another person do you think they would need if they tried?) Total  A Lot A Little None   1.  Turning from your back to your side while in a flat bed without using bedrails? [x]   1 []   2 []   3  []   4   2.  Moving from lying on your back to sitting on the side of a flat bed without using bedrails? [x]   1 []   2 []   3  []   4   3.  Moving to and from a bed to a chair (including a wheelchair)? [x]   1 []   2 []   3  []   4   4. Standing up from a chair using your arms (e.g. wheelchair or bedside chair)? [x]   1 []   2 []   3  []   4   5.  Walking in hospital room? [x]   1 []   2 []   3  []   4   6.  Climbing 3-5 steps with a railing? [x]   1 []   2 []   3  []   4     Raw Score: 6/24                            Cutoff score ?171,2,3 had higher odds of discharging home with home health or  need of SNF/IPR.    1. , , Vinoth , Passek, . .  Validity of the AM-PAC "6-Clicks" Inpatient Daily Activity and Basic Mobility Short Forms. Physical Therapy Mar 2014, [phone]) 379-391; DOI: 10.2522/ptj.20130199  2. . Association of AM-PAC "6-Clicks" Basic Mobility and Daily Activity Scores With Discharge Destination. Phys Ther. 2021 Apr 4;101(4):pzab043. doi: 10.1093/ptj/pzab043. PMID: .  3. Herbold J, Rajaraman D, , Agayby K, [place] S. Activity Measure for Post-Acute Care "6-Clicks" Basic Mobility Scores Predict Discharge Destination After Acute Care Hospitalization in Select Patient Groups: A Retrospective, Observational Study. Arch Rehabil Res Clin Transl. 2022 Jul 16;4(3):100204. doi: 10.1016/j.arrct. . PMID: ; PMCID .  4. , Coster W, Ni P. AM-PAC Short Forms Manual 4.0. Revised 12/2018.                                                                                                                                                                                                                              Pain Rating:  Did not report pain    Activity  Tolerance:   Fair  however VSS on 2L O2    After treatment:   Patient left in no apparent distress in bed, Call bell within reach, Caregiver / family present, Side rails x3, and Heels elevated for pressure relief    COMMUNICATION/EDUCATION:   The patient's plan of care was discussed with: occupational therapist and registered nurse    Patient Education  Education Given To: Patient  Education Provided: Role of Therapy  Education Method: Verbal  Barriers to Learning: None  Education Outcome: Verbalized understanding    Thank you for this referral.  Demetrio Lapping, PT, DPT  Minutes: 25      Physical Therapy Evaluation Charge Determination   History Examination Presentation Decision-Making    MEDIUM  Complexity : 1-2 comorbidities / personal factors will impact the outcome/ POC  MEDIUM Complexity : 3 Standardized tests and measures addressin body structure, function, activity limitation and / or participation in recreation  MEDIUM Complexity : Evolving with changing characteristics  AM-PAC  MEDIUM   Based on the above components, the patient evaluation is determined to be of the following complexity level: Medium

## 2022-06-10 NOTE — Progress Notes (Signed)
Occupational Therapy    Orders received, chart reviewed. Pt currently receiving HD at the bedside. Will defer however continue to follow. Thank you

## 2022-06-10 NOTE — Progress Notes (Signed)
Speech Pathology Note    SLP orders received and chart reviewed. Discussed with RN, who reported patient did not pass [employer] Screen. Patient currently working with PT/OT at the time of SLP attempted visit. Will defer SLP evaluation and will follow up later. Thank you.    Katy Apo, M.S., CCC-SLP

## 2022-06-10 NOTE — Progress Notes (Signed)
[facility] PROGRESS NOTE.      Name: Jaime [name]   DOB: Sep 18, 1946   MRN: [id number]   Date: 06/10/2022      Chief Complaint   Patient presents with    Hypotension     Hx hypertension - BP is 70s/40s    Altered Mental Status    Nausea    Emesis       Reason for ICU:     Septic shock with multi organ failure  Acute abdomen, s/p emergent exploratory laparotomy 06/01/22    Subjective/Hospital Course:     76 y/o male ([facility] [place]) with PMHx significant of HTN, HLD and T2DM. Came in ED 7/30 noon for N/V/Abd pain and generalized weakess. Found to be encephalopathic, shock state (BP 80s and lactate 10), AKI (Cr 4.06). Received 2L LR, started on Levophed, Vancomycin/Zosyn. Surgery consulted and patient underwent emergent exploratory laparotomy last night that did not reveal perforation but fluid collection in right upper quadrant that was sent for cultures and sensitivity.   7/31/: Patient transferred to ICU post op early AM. Increasing pressor needs, shock liver, thrombocytopenia, coagulopathic, anuric AKI with developing hyperkalemia and started on [facility]. Stress dose steroids and thiamine added for refractory shock. Abx changed to Vanc and merrem.   06/02/22: VT episodes with pulse, shocked x 3 and amio boluses 150 mg each x 3 fallowed by amio drip. Cardio consulted.   8/3: Intubated and sedated. ON of levophed and vasopressin, on 50% Fio2 and PEEP of 7. CRRT ongoing with no factor. RASS neg 5. On 0.5mg /hr of versed gtt. Which is I turned off during rounds.   8/4: Remains intubated and sedated. On fentanyl gtt. Still RASS is neg 5. Down to of levophed. On CRRT with factor of 48ml/hr started today. Found to have liver abscesses on CT abd, plan to place drain today by IR.  8/5: Patient remains intubated and sedated, Overnight had episodes of NSVT. Currently on of levophed gtt.   8/6: Patient remains intubated, on low dose fentanyl. Intermittently following commands. More awake than  yesterday. On CRRT with factor fo 24ml/hr.  8/7: Patient is able to follow commands today. Off pressors. Started on SBT.   8/8: Patient was extubated on 8/7, reports feeling better. On 2 lit oxygen. Still on amiodarone gtt.   08/09 - no acute events overnight.     Principle ICU Diagnosis     # Septic shock, 2/2 intra abdominal source.    # Acute abdomen from suspected abdominal viscus perforation   S/p emergent exploratory laparotomy 06/01/22    Stress dose steroids added 06/01/22 - weaning.   Fludrocortisone added 06/02/22, stopped on 8/4   Vanc stopped 06/02/22.    BC growing enterobacter and e coli 7/31   Currently on rocephin and flagyl and eraxis     Worsening Leukocytosis - normotensive / afebrile    Monitor closely for signs of infections    Repeat CT on 8/3 showed Liver abscesses.    CT on 8/7 showed decreased size of abscess post drain placement.     #Liver abscess involving most of left lobe. S/p drain placement on 8/4    # Elevated troponin, likely demand ischemia with non specific EKG changes  # VT with further hemodynamic compromise 06/02/22, controlled now   S/p electric CV x 2 followed by amio bolus 150 mg x2 on 06/01/22 AM   On amio drip. EP following.       #  Acute respiratory failure with hypoxemia and hypercapnia, ventilatory dependant   Intubated 06/01/22 and extubated on 8/7.    # Acute encephalopathy, likely metabolic and toxic from sepsis- improved.  # Propofol infusion toxicity/ intolerance   Rising Cpk TG and cardiac arrythmia noted 06/02/22   Propofol changed to versed 06/02/22 but now bother are off.     # Acute Kidney injury, anuric. Likely ATN   CVVHDF initiated 06/01/22 through right Fem NTC   Hyperkalemia - discuss with nephrology and adjust HD    # Multi organ failure including shock liver  # Coagulopathy, elevated PT, INR with no evidence of obvious bleeding  # Thrombocytopenia, Likely from sepsis.   Improved     Plan  - transition to oral amiodarone today - DC ggt   -Continue aggressive pulmonary  clearance.  - Abx per ID.  - Repeat CT abd on 8/3 showed liver abscesses, s/p drain placement on 8/4. CT on 8/7 showed decreased size of abscess post drain placement. FU cultures.   - Advance feeding per Gen surgery. Bowel regimen.  - Continue HD per nephrology.    Continue care in ICU.      Review of systems:     Review of Systems  No new complaints    Objective:     Vital Signs:  BP 126/60   Pulse 83   Temp 98.9 F (37.2 C) (Axillary)   Resp 25   Ht 1.727 m (5\' 8" )   Wt 103.7 kg (228 lb 9.9 oz)   SpO2 95%   BMI 34.76 kg/m      Temp (24hrs), Avg:99.1 F (37.3 C), Min:98.6 F (37 C), Max:100 F (37.8 C)           Intake/Output:     Intake/Output Summary (Last 24 hours) at 06/10/2022 0805  Last data filed at 06/10/2022 0400  Gross per 24 hour   Intake 923.7 ml   Output 365 ml   Net 558.7 ml         Physical Exam  Constitutional:       Appearance: He is ill-appearing.   HENT:      Head: Normocephalic and atraumatic.   Eyes:      Pupils: Pupils are equal, round, and reactive to light.   Cardiovascular:      Rate and Rhythm: Normal rate and regular rhythm.      Pulses: Normal pulses.      Heart sounds: Normal heart sounds.   Abdominal:      General: There is distension.      Palpations: Abdomen is soft.   Skin:     Capillary Refill: Capillary refill takes more than 3 seconds.      Findings: Erythema: cyanosis of toes but warm.   Neurological:      Mental Status: He is alert and oriented to person, place, and time.       LABS AND  DATA:   Reviewed     I personally spent --- minutes of critical care time.  This is time spent at this critically ill patient's bedside actively involved in patient care as well as the coordination of care and discussions with the patient's family.  This does not include any procedural time which has been billed separately.       CRITICAL CARE CONSULTANT NOTE  I had a face to face encounter with the patient, reviewed and interpreted patient data including clinical events, labs, images,  vital signs, I/O's, and examined patient.  I have discussed the case and the plan and management of the patient's care with the consulting services, the bedside nurses and the respiratory therapist.      NOTE OF PERSONAL INVOLVEMENT IN CARE   This patient has a high probability of imminent, clinically significant deterioration, which requires the highest level of preparedness to intervene urgently. I participated in the decision-making and personally managed or directed the management of the following life and organ supporting interventions that required my frequent assessment to treat or prevent imminent deterioration.    Hilaria Ota, MD   [facility]  [phone]  06/10/2022

## 2022-06-10 NOTE — Progress Notes (Addendum)
1920: Bedside shift change report given to Jeffie Pollock., RN/Andi B., RN (oncoming nurses) by Romona Curls., RN (offgoing nurse). Report included the following information Nurse Handoff Report, Intake/Output, MAR, Recent Results, Med Rec Status, and Cardiac Rhythm NSR .     2045: Patient's gastric residuals > . NP made aware, received orders to hold TF for the time being.

## 2022-06-10 NOTE — Progress Notes (Signed)
Palliative Medicine  [name] is a 76 y.o. with a past history of HTN, HLD, and DM2, who was admitted on 05/31/2022 while traveling in [place] for Nascar race, with a diagnosis of Perforated Viscus seen on CT, Septic Shock, and AKI.    Code Status: Full Code    Advance Care Planning:  Demographics 06/08/2022   Marital Status Married       Patient / Family Encounter Documentation    Participants (names): spouse Romana Juniper, "play daughter" Marlou Starks, Natalia Leatherwood, [place]    Narrative: Palliative SW introduced self to spouse and friend outside patient's room and to patient once PT/OT finished their session.  Patient was received with hob elevated, alert and oriented to self and family and situation.      This Clinical research associate educated them on Palliative team services available including completing AMD if patient wishes to do so during this hospitalization. Dr. Marcha Dutton came in and updated patient and spouse on his condition and spouse asked if he could be transferred to a hospital closer to their home in [place], [place].  [name] gave [facility] as their preference, with [facility] as next choice. She also provided his [employer] and shared that he has been affiliated with the [facility] in [place], [place] as well. This Clinical research associate shared this information with Dr. Marcha Dutton.      His wife can only stay a few hours today because she has left their dog at home and will need to tend to him and is unsure of how reliable her car is.    Psychosocial Issues Identified/ Resilience Factors:   Patient is a veteran of the [name]. He is retired now and has VA benefits, but a high co-pay for his medication due to his income.  His wife uses O2 and has a Designer, jewellery with only a Art therapist for this trip. She ambulates in a wheelchair.     Caregiver Burden: High  Does the caregiver feel confident administering medication?   Does the caregiver need any help connecting with community resources?   Does the  caregiver feel confident assisting with activities of daily living?     Goals of Care / Plan:   Patient's symptoms will be managed.  Patient wants to transfer as soon as medically stable to a hospital in [place], [place].  Spouse now has his wedding ring, which was tight on his finger and removed by ICU nurse.  Patient will be offered the opportunity to complete an AMD if he interested and able during this hospitalization.  Palliative team will continue to provide education and support as appropriate.    Thank you for including Palliative Medicine in the care of [name].    Natalia Leatherwood, LCSW  [phone])

## 2022-06-10 NOTE — Progress Notes (Incomplete)
1900: Report given at bedside by Irving Burton, RN in [employer] with all questions and concerns addressed.    2000: Pt assessment completed with findings documented.     2045: Gastric residuals >428mL notified NP and advised to d/c tube feed temporarily and reassess at midnight.     2100: Assisted pt was deep breathing and coughing to help expel phlegm in throat.     2200: Rounded on pt, resting quietly in bed, pt repositioned.    0000: Reassessed gastric residuals, 210 mL, continued to hold tube feedings. Bladder scan completed

## 2022-06-11 ENCOUNTER — Inpatient Hospital Stay: Admit: 2022-06-11 | Payer: MEDICARE

## 2022-06-11 ENCOUNTER — Ambulatory Visit (HOSPITAL_COMMUNITY): Payer: Medicare HMO

## 2022-06-11 LAB — CBC WITH AUTO DIFFERENTIAL
Absolute Immature Granulocyte: 0 10*3/uL (ref 0.00–0.04)
Basophils %: 0 % (ref 0–1)
Basophils Absolute: 0 10*3/uL (ref 0.0–0.1)
Eosinophils %: 0 % (ref 0–7)
Eosinophils Absolute: 0 10*3/uL (ref 0.0–0.4)
Hematocrit: 25.6 % — ABNORMAL LOW (ref 36.6–50.3)
Hemoglobin: 8.8 g/dL — ABNORMAL LOW (ref 12.1–17.0)
Immature Granulocytes: 0 % (ref 0.0–0.5)
Lymphocytes %: 4 % — ABNORMAL LOW (ref 12–49)
Lymphocytes Absolute: 1.2 10*3/uL (ref 0.8–3.5)
MCH: 29.7 PG (ref 26.0–34.0)
MCHC: 34.4 g/dL (ref 30.0–36.5)
MCV: 86.5 FL (ref 80.0–99.0)
Monocytes %: 5 % (ref 5–13)
Monocytes Absolute: 1.5 10*3/uL — ABNORMAL HIGH (ref 0.0–1.0)
Neutrophils %: 91 % — ABNORMAL HIGH (ref 32–75)
Neutrophils Absolute: 26.8 10*3/uL — ABNORMAL HIGH (ref 1.8–8.0)
Nucleated RBCs: 0 PER 100 WBC
Platelets: 173 10*3/uL (ref 150–400)
RBC: 2.96 M/uL — ABNORMAL LOW (ref 4.10–5.70)
RDW: 18.1 % — ABNORMAL HIGH (ref 11.5–14.5)
WBC: 29.5 10*3/uL — ABNORMAL HIGH (ref 4.1–11.1)
nRBC: 0 10*3/uL (ref 0.00–0.01)

## 2022-06-11 LAB — POCT GLUCOSE
POC Glucose: 131 mg/dL — ABNORMAL HIGH (ref 65–117)
POC Glucose: 108 mg/dL (ref 65–117)
POC Glucose: 113 mg/dL (ref 65–117)
POC Glucose: 96 mg/dL (ref 65–117)

## 2022-06-11 LAB — BASIC METABOLIC PANEL
Anion Gap: 9 mmol/L (ref 5–15)
BUN: 63 MG/DL — ABNORMAL HIGH (ref 6–20)
Bun/Cre Ratio: 14 (ref 12–20)
CO2: 21 mmol/L (ref 21–32)
Calcium: 7.7 MG/DL — ABNORMAL LOW (ref 8.5–10.1)
Chloride: 103 mmol/L (ref 97–108)
Creatinine: 4.53 MG/DL — ABNORMAL HIGH (ref 0.70–1.30)
Est, Glom Filt Rate: 13 mL/min/{1.73_m2} — ABNORMAL LOW (ref >60)
Glucose: 134 mg/dL — ABNORMAL HIGH (ref 65–100)
Potassium: 5.2 mmol/L — ABNORMAL HIGH (ref 3.5–5.1)
Sodium: 133 mmol/L — ABNORMAL LOW (ref 136–145)

## 2022-06-11 LAB — CULTURE, BLOOD 1: Culture: NO GROWTH

## 2022-06-11 LAB — CULTURE, BLOOD 2: Culture: NO GROWTH

## 2022-06-11 LAB — MAGNESIUM: Magnesium: 2.8 mg/dL — ABNORMAL HIGH (ref 1.6–2.4)

## 2022-06-11 LAB — PHOSPHORUS: Phosphorus: 6.4 MG/DL — ABNORMAL HIGH (ref 2.6–4.7)

## 2022-06-11 MED ORDER — INSULIN LISPRO 100 UNIT/ML IJ SOLN
100 UNIT/ML | Freq: Three times a day (TID) | INTRAMUSCULAR | Status: AC
Start: 2022-06-11 — End: 2022-06-17

## 2022-06-11 MED ORDER — BISACODYL 10 MG RE SUPP
10 MG | RECTAL | Status: AC
Start: 2022-06-11 — End: 2022-06-11
  Administered 2022-06-11: 19:00:00 10 mg via RECTAL

## 2022-06-11 MED ORDER — NITROGLYCERIN IN D5W 100-5 MCG/ML-% IV SOLN
100 mcg/mL | INTRAVENOUS | Status: AC
Start: 2022-06-11 — End: ?

## 2022-06-11 MED ORDER — AMIODARONE HCL 450 MG/9ML IV SOLN
450 MG/9ML | INTRAVENOUS | Status: AC
Start: 2022-06-11 — End: ?

## 2022-06-11 MED ORDER — DEXMEDETOMIDINE HCL IN NACL 400 MCG/100ML IV SOLN
400 MCG/100ML | INTRAVENOUS | Status: AC
Start: 2022-06-11 — End: ?

## 2022-06-11 MED ORDER — INSULIN LISPRO 100 UNIT/ML IJ SOLN
100 UNIT/ML | Freq: Every evening | INTRAMUSCULAR | Status: AC
Start: 2022-06-11 — End: 2022-06-17

## 2022-06-11 MED ORDER — BISACODYL 10 MG RE SUPP
10 | Freq: Every day | RECTAL | Status: DC | PRN
Start: 2022-06-11 — End: 2022-08-03
  Administered 2022-07-27: 22:00:00 10 mg via RECTAL

## 2022-06-11 MED FILL — METRONIDAZOLE 500 MG/100ML IV SOLN: 500 MG/100ML | INTRAVENOUS | Qty: 100

## 2022-06-11 MED FILL — SENNA PLUS 8.6-50 MG PO TABS: ORAL | Qty: 2

## 2022-06-11 MED FILL — CEFTRIAXONE SODIUM 2 G IJ SOLR: 2 g | INTRAMUSCULAR | Qty: 2000

## 2022-06-11 MED FILL — LANSOPRAZOLE 30 MG PO TBDD: 30 MG | ORAL | Qty: 1

## 2022-06-11 MED FILL — HEPARIN SODIUM (PORCINE) 5000 UNIT/ML IJ SOLN: 5000 UNIT/ML | INTRAMUSCULAR | Qty: 1

## 2022-06-11 MED FILL — DEXMEDETOMIDINE HCL IN NACL 400 MCG/100ML IV SOLN: 400 MCG/100ML | INTRAVENOUS | Qty: 100

## 2022-06-11 MED FILL — BISACODYL 10 MG RE SUPP: 10 MG | RECTAL | Qty: 1

## 2022-06-11 MED FILL — NITROGLYCERIN IN D5W 100-5 MCG/ML-% IV SOLN: 100 mcg/mL | INTRAVENOUS | Qty: 10

## 2022-06-11 MED FILL — AMIODARONE HCL 450 MG/9ML IV SOLN: 450 MG/9ML | INTRAVENOUS | Qty: 9

## 2022-06-11 MED FILL — AMIODARONE HCL 200 MG PO TABS: 200 MG | ORAL | Qty: 2

## 2022-06-11 MED FILL — HUMULIN N 100 UNIT/ML SC SUSP: 100 UNIT/ML | SUBCUTANEOUS | Qty: 1

## 2022-06-11 MED FILL — HEALTHYLAX 17 G PO PACK: 17 g | ORAL | Qty: 1

## 2022-06-11 NOTE — Progress Notes (Signed)
[facility] PROGRESS NOTE.      Name: Jaime [name]   DOB: August 15, 1946   MRN: [id number]   Date: 06/11/2022      Chief Complaint   Patient presents with    Hypotension     Hx hypertension - BP is 70s/40s    Altered Mental Status    Nausea    Emesis       Reason for ICU:     Septic shock with multi organ failure  Acute abdomen, s/p emergent exploratory laparotomy 06/01/22    Subjective/Hospital Course:     76 y/o male ([facility] [place]) with PMHx significant of HTN, HLD and T2DM. Came in ED 7/30 noon for N/V/Abd pain and generalized weakess. Found to be encephalopathic, shock state (BP 80s and lactate 10), AKI (Cr 4.06). Received 2L LR, started on Levophed, Vancomycin/Zosyn. Surgery consulted and patient underwent emergent exploratory laparotomy last night that did not reveal perforation but fluid collection in right upper quadrant that was sent for cultures and sensitivity.   7/31/: Patient transferred to ICU post op early AM. Increasing pressor needs, shock liver, thrombocytopenia, coagulopathic, anuric AKI with developing hyperkalemia and started on [facility]. Stress dose steroids and thiamine added for refractory shock. Abx changed to Vanc and merrem.   06/02/22: VT episodes with pulse, shocked x 3 and amio boluses 150 mg each x 3 fallowed by amio drip. Cardio consulted.   8/3: Intubated and sedated. ON of levophed and vasopressin, on 50% Fio2 and PEEP of 7. CRRT ongoing with no factor. RASS neg 5. On 0.5mg /hr of versed gtt. Which is I turned off during rounds.   8/4: Remains intubated and sedated. On fentanyl gtt. Still RASS is neg 5. Down to of levophed. On CRRT with factor of 41ml/hr started today. Found to have liver abscesses on CT abd, plan to place drain today by IR.  8/5: Patient remains intubated and sedated, Overnight had episodes of NSVT. Currently on of levophed gtt.   8/6: Patient remains intubated, on low dose fentanyl. Intermittently following commands. More awake than  yesterday. On CRRT with factor fo 75ml/hr.  8/7: Patient is able to follow commands today. Off pressors. Started on SBT.   8/8: Patient was extubated on 8/7, reports feeling better. On 2 lit oxygen. Still on amiodarone gtt.   08/09 - Failed speech eval.     Principle ICU Diagnosis     # Septic shock, 2/2 intra abdominal source.    # Acute abdomen from suspected abdominal viscus perforation   S/p emergent exploratory laparotomy 06/01/22    Stress dose steroids added 06/01/22 - weaning.   Fludrocortisone added 06/02/22, stopped on 8/4   Vanc stopped 06/02/22.    BC growing enterobacter and e coli 7/31   Currently on rocephin and flagyl       #Liver abscess involving most of left lobe. S/p drain placement on 8/4   Repeat CT on 8/3 showed Liver abscesses s/p drain placement    CT on 8/7 showed decreased size of abscess post drain placement.    # Elevated troponin, likely demand ischemia with non specific EKG changes  # VT with further hemodynamic compromise 06/02/22, controlled now   S/p electric CV x 2 followed by amio bolus 150 mg x2 on 06/01/22 AM   On amio drip, transitioned to PO.     # Acute respiratory failure with hypoxemia and hypercapnia, ventilatory dependant   Intubated 06/01/22 and extubated on 8/7.    #  Acute encephalopathy, likely metabolic and toxic from sepsis- resolved.   # Propofol infusion toxicity/ intolerance   Rising Cpk TG and cardiac arrythmia noted 06/02/22   Propofol changed to versed 06/02/22 but now both are off.     # Acute Kidney injury, anuric. Likely ATN   CVVHDF initiated 06/01/22 through right Fem NTC   Dialysis dependant now    # Failed swallow eval   NG in place with tube feeds ongoing     # LE gangrene/ discoloration    Plan  - Abx per ID. FU cultures.   - continue PO amio  - Change NG to Dubhoff for feeding  - Continue HD per nephrology.  - Consult podiatry     Continue monitor in ICU for one more day and will transfer to floor tomorrow.       Review of systems:     Review of Systems  No new  symptoms   Objective:     Vital Signs:  BP (!) 125/57   Pulse 85   Temp 98.6 F (37 C) (Oral)   Resp 26   Ht 1.727 m (5\' 8" )   Wt 107.7 kg (237 lb 7 oz)   SpO2 92%   BMI 36.10 kg/m      Temp (24hrs), Avg:98.7 F (37.1 C), Min:97.7 F (36.5 C), Max:99.7 F (37.6 C)           Intake/Output:     Intake/Output Summary (Last 24 hours) at 06/11/2022 1350  Last data filed at 06/11/2022 1100  Gross per 24 hour   Intake 1903.33 ml   Output 3200 ml   Net -1296.67 ml         Physical Exam  Constitutional:       Appearance: He is ill-appearing.   HENT:      Head: Normocephalic and atraumatic.   Eyes:      Pupils: Pupils are equal, round, and reactive to light.   Cardiovascular:      Rate and Rhythm: Normal rate and regular rhythm.      Pulses: Normal pulses.      Heart sounds: Normal heart sounds.   Abdominal:      General: There is distension.      Palpations: Abdomen is soft.   Skin:     Capillary Refill: Capillary refill takes more than 3 seconds.      Findings: Erythema: cyanosis of toes but warm.   Neurological:      Mental Status: He is alert and oriented to person, place, and time.       LABS AND  DATA:   Reviewed     I personally spent 35 minutes of critical care time.  This is time spent at this critically ill patient's bedside actively involved in patient care as well as the coordination of care and discussions with the patient's family.  This does not include any procedural time which has been billed separately.       CRITICAL CARE CONSULTANT NOTE  I had a face to face encounter with the patient, reviewed and interpreted patient data including clinical events, labs, images, vital signs, I/O's, and examined patient.  I have discussed the case and the plan and management of the patient's care with the consulting services, the bedside nurses and the respiratory therapist.      NOTE OF PERSONAL INVOLVEMENT IN CARE   This patient has a high probability of imminent, clinically significant deterioration, which  requires the highest level of  preparedness to intervene urgently. I participated in the decision-making and personally managed or directed the management of the following life and organ supporting interventions that required my frequent assessment to treat or prevent imminent deterioration.    Yevonne Pax, MD   [facility]  [phone]  06/10/2022

## 2022-06-11 NOTE — Progress Notes (Signed)
Comprehensive Nutrition Assessment    Type and Reason for Visit:  Reassess    Nutrition Recommendations/Plan:   NPO per SLP  Change TF formula to Nepro d/t elevated K and Phos despite HD. Resume 44m/hr feeds as tolerated for now. Goal rate 4106mhr + Pro BID + 5010mlush Q 4hr. Goal rate provides 1888 kcals, 118g Pro, 154 CHO, 1028m39mter.   Increase bowel regimen, still no BM since admission.      Malnutrition Assessment:  Malnutrition Status:  Insufficient data (06/04/22 1319)        Nutrition Assessment:     Chart reviewed and case discussed during CCU rounds. Pt extubated since last RD note. He failed SLP eval yesterday, tube feeding continues to be his primary source of nutrition for now. TF was held overnight d/t residuals up to 400mL66mscussed increasing bowel regimen with team and checking KUB (no sign of obstruction). RN to resume TF this afternoon on Nepro formula (changed from TwoCal). Pt pleasant and alert, with no questions regarding nutrition at this time. No complains of abdominal discomfort and sometimes feels like he could have a BM.      Nutrition Related Findings:    Labs: Na 133, K 5.2, phos 6.4, Cr 4.53.   Meds: dulcolax suppository, rocephin, humalog, NPH, prevacid, flagyl, miralax, senna.   Edema: 2+ generalized & all extremities, 1+ perineal & sacral, trace facial.   BM PTA.   Wound Type: Surgical Incision       Current Nutrition Intake & Therapies:    Average Meal Intake: NPO     ADULT TUBE FEEDING; Nasogastric; Renal Formula; Continuous; 30; No; 50; Q 4 hours; Protein; Prosource BID    Anthropometric Measures:  Height: 172.7 cm (_0 )  Ideal Body Weight (IBW): 154 lbs (70 kg)       Current Body Weight: 237 lb 7 oz (107.7 kg), 148.5 % IBW. Weight Source: Bed Scale  Current BMI (kg/m2): 36.1                          BMI Categories: Obese Class 2 (BMI 35.0 -39.9)    Estimated Daily Nutrient Needs:  Energy Requirements Based On: Formula  Weight Used for Energy Requirements: Current  Energy  (kcal/day): 2323 kcals (BMR x 1.3AF)  Weight Used for Protein Requirements: Ideal  Protein (g/day): 105-140g (1.5-2gPro/kg IBW)  Method Used for Fluid Requirements: Other (Comment)  Fluid (ml/day): per MD    Nutrition Diagnosis:   Inadequate protein-energy intake related to impaired respiratory function as evidenced by NPO or clear liquid status due to medical condition  Dx continues. TF on intermittently, not at goal.     Nutrition Interventions:   Food and/or Nutrient Delivery: Modify Tube Feeding  Nutrition Education/Counseling: No recommendation at this time  Coordination of Nutrition Care: Continue to monitor while inpatient, Interdisciplinary Rounds, Speech Therapy, Swallow Evaluation       Goals:  Previous Goal Met: Progressing toward Goal(s)  Goals: Tolerate nutrition support at goal rate, Initiate PO diet, by next RD assessment       Nutrition Monitoring and Evaluation:   Behavioral-Environmental Outcomes: None Identified  Food/Nutrient Intake Outcomes: Enteral Nutrition Intake/Tolerance, IVF Intake  Physical Signs/Symptoms Outcomes: Biochemical Data, Nutrition Focused Physical Findings, Skin, Weight, GI Status, Hemodynamic Status, Fluid Status or Edema    Discharge Planning:    Too soon to determine     SarahKristin Bruins Contact: [phone]

## 2022-06-11 NOTE — Plan of Care (Signed)
Problem: Physical Therapy - Adult  Goal: By Discharge: Performs mobility at highest level of function for planned discharge setting.  See evaluation for individualized goals.  Description: FUNCTIONAL STATUS PRIOR TO ADMISSION: Patient was independent and active without use of DME. Drove himself to [place] from [place] for [place] race. Denies home O2 use.     HOME SUPPORT PRIOR TO ADMISSION: The patient lived with wife however wife has her own medical issues and cannot provide physical assist.    Physical Therapy Goals  Initiated 06/10/2022  1.  Patient will move from supine to sit and sit to supine, scoot up and down, and roll side to side in bed with contact guard assist within 7 day(s).   2. Patient will sit EOB x 10 minutes with supervision/set-up assist within 7 days.    2.  Patient will perform sit to stand with moderate assistance x2 within 7 day(s).  3.  Patient will transfer from bed to chair and chair to bed with moderate assistance x2 using the least restrictive device within 7 day(s).  4.  Patient will ambulate with moderate assistance x2 for 5 feet with the least restrictive device within 7 day(s).   Outcome: Progressing   PHYSICAL THERAPY TREATMENT    Patient: Jaime [name] (76 y.o. male)  Date: 06/11/2022  Diagnosis: Septicemia (HCC) [A41.9]  Gastric perforation (HCC) [K25.5]  Perforated abdominal viscus [R19.8] Gastric perforation (HCC)  Procedure(s) (LRB):  LAPAROTOMY EXPLORATORY (N/A)  LAPAROSCOPY DIAGNOSTIC (N/A)  ENDOSCOPY OF ILEAL CONDUIT (N/A) 10 Days Post-Op  Precautions: NPO                    ASSESSMENT:  Patient continues to benefit from skilled PT services and is slowly progressing towards goals, demonstrating minor improvement in activity tolerance, sitting balance, and strength this date. Pt with minor confusion this date however able to be re-oriented. Pt assumed seated position EOB w/ maxAx2 and tolerated EOB activity x11 minutes before fatigue. Pt continues to require CG/minA  to achieve and maintain midline sitting however demonstrated multiple episodes of maintaining static sitting balance EOB with supervision only. However, as pt fatigued, he required up to mod/maxA to maintain seated position. Improved ability to right seated LOB. Pt performed BLE strengthening exercises while seated EOB and tolerated well. Overall, pt tolerated therapy session well and is making steady progress towards therapy goals and mobility. Continue to recommend intensive rehab at discharge.          PLAN:  Patient continues to benefit from skilled intervention to address the above impairments.  Continue treatment per established plan of care.    Recommendation for discharge: (in order for the patient to meet his/her long term goals): Therapy 3 hours/day 5-7 days/week    Other factors to consider for discharge:  independent prior to admission, prolonged/complicated hospital course, caregiver burden, falls risk    IF patient discharges home will need the following DME: continuing to assess with progress       SUBJECTIVE:   Patient stated, "My wife and I have been married 73 years."    OBJECTIVE DATA SUMMARY:   Critical Behavior:          Functional Mobility Training:  Bed Mobility:  Bed Mobility Training  Bed Mobility Training: Yes  Rolling: Maximum assistance;Assist X2  Supine to Sit: Maximum assistance;Assist X2  Sit to Supine: Maximum assistance;Assist X2  Scooting: Maximum assistance;Assist X2  Transfers:  Psychologist, prison and probation services Training: No  Balance:  Balance  Sitting: Impaired  Sitting - Static: Fair (occasional);Poor (constant support)  Sitting - Dynamic: Poor (constant support)   Ambulation/Gait Training:        Neuro Re-Education:            Therapeutic Exercises:     EXERCISE   Sets   Reps   Active Active Assist   Passive Self ROM   Comments   Ankle Pumps   []  []  []  []     Quad Sets   []  []  []  []     Hamstring Sets   []  []  []  []     Gluteal Sets   []  []  []  []     Short Arc Quads   []  []  []  []     Heel  Slides   []  []  []  []     Straight Leg Raises   []  []  []  []     Hip abd/add   []  []  []  []     Long Arc Quads   []  []  []  []     Marching   []  []  []  []        []  []  []  []           Pain Rating:  Denied c/o pain    Activity Tolerance:   VSS on RA throughout; improving     After treatment:   Patient left in no apparent distress in bed, Call bell within reach, Side rails x3, and Heels elevated for pressure relief      COMMUNICATION/EDUCATION:   The patient's plan of care was discussed with: occupational therapist and registered nurse    Patient Education  Education Given To: Patient  Education Provided: Role of Therapy  Education Method: Verbal  Barriers to Learning: None  Education Outcome: Verbalized understanding      , PT, DPT  Minutes: 29

## 2022-06-11 NOTE — Progress Notes (Signed)
Spiritual Care Assessment/Progress Note  [facility]    Name: Savas Elvin MRN: [id number]    Age: 76 y.o.     Sex: male   Language: English     Date: 06/11/2022            Total Time Calculated: 5 min              Spiritual Assessment begun in [facility]  Service Provided For:: Patient not available     Encounter Overview/Reason : Attempted Encounter, Palliative Care    Spiritual beliefs:      []  Involved in a faith tradition/spiritual practice:      []  Supported by a faith community:      []  Claims no spiritual orientation:      []  Seeking spiritual identity:           []  Adheres to an individual form of spirituality:      [x]  Not able to assess:                Identified resources for coping and support system:           []  Prayer                  []  Devotional reading               []  Music                  []  Guided Imagery     []  Pet visits                                        []  Other: (COMMENT)     Specific area/focus of visit   Encounter:    Crisis:    Spiritual/Emotional needs:    Ritual, Rites and Sacraments:    Grief, Loss, and Adjustments:    Ethics/Mediation:    Behavioral Health:    Palliative Care:    Advance Care Planning:           Narrative: Second attempt at initial spiritual assessment with palliative pt in 2513. Pt sleeping while being attended to by staff. No family present.

## 2022-06-11 NOTE — Progress Notes (Signed)
Admit Date: 05/31/2022    POD 10 Days Post-Op    Procedure:  Procedure(s):  LAPAROTOMY EXPLORATORY  LAPAROSCOPY DIAGNOSTIC  ENDOSCOPY OF ILEAL CONDUIT    Subjective:     Patient extubated, no c/o.  + BM. Tfs held after high residuals. WBC downtrending 30 from 35.     Objective:     Blood pressure (!) 123/58, pulse 86, temperature 98.6 F (37 C), temperature source Oral, resp. rate 22, height 1.727 m (5\' 8" ), weight 107.7 kg (237 lb 7 oz), SpO2 91 %.    Temp (24hrs), Avg:98.6 F (37 C), Min:97.7 F (36.5 C), Max:99.7 F (37.6 C)      Physical Exam:  GENERAL: appears stated age, LUNG: clear to auscultation bilaterally, HEART: regular rate and rhythm, ABDOMEN: soft, distended, wound c/d/I, JP drainage serous appearing today, pigtail abscess drain bilious, EXTREMITIES:  ischemia of first 2 toes bilaterally.     Labs:   Recent Results (from the past 24 hour(s))   POCT Glucose    Collection Time: 06/10/22  5:05 PM   Result Value Ref Range    POC Glucose 156 (H) 65 - 117 mg/dL    Performed by: 08/10/22 PCT    POCT Glucose    Collection Time: 06/10/22 10:48 PM   Result Value Ref Range    POC Glucose 131 (H) 65 - 117 mg/dL    Performed by: 08/10/22 RN    Basic Metabolic Panel    Collection Time: 06/11/22  5:13 AM   Result Value Ref Range    Sodium 133 (L) 136 - 145 mmol/L    Potassium 5.2 (H) 3.5 - 5.1 mmol/L    Chloride 103 97 - 108 mmol/L    CO2 21 21 - 32 mmol/L    Anion Gap 9 5 - 15 mmol/L    Glucose 134 (H) 65 - 100 mg/dL    BUN 63 (H) 6 - 20 MG/DL    Creatinine 08/11/22 (H) 0.70 - 1.30 MG/DL    Bun/Cre Ratio 14 12 - 20      Est, Glom Filt Rate 13 (L) >60 ml/min/1.58m2    Calcium 7.7 (L) 8.5 - 10.1 MG/DL   CBC with Auto Differential    Collection Time: 06/11/22  5:13 AM   Result Value Ref Range    WBC 29.5 (H) 4.1 - 11.1 K/uL    RBC 2.96 (L) 4.10 - 5.70 M/uL    Hemoglobin 8.8 (L) 12.1 - 17.0 g/dL    Hematocrit 08/11/22 (L) 36.6 - 50.3 %    MCV 86.5 80.0 - 99.0 FL    MCH 29.7 26.0 - 34.0 PG    MCHC 34.4 30.0 - 36.5  g/dL    RDW 88.4 (H) 16.6 - 14.5 %    Platelets 173 150 - 400 K/uL    Nucleated RBCs 0.0 0 PER 100 WBC    nRBC 0.00 0.00 - 0.01 K/uL    Neutrophils % 91 (H) 32 - 75 %    Lymphocytes % 4 (L) 12 - 49 %    Monocytes % 5 5 - 13 %    Eosinophils % 0 0 - 7 %    Basophils % 0 0 - 1 %    Immature Granulocytes 0 0.0 - 0.5 %    Neutrophils Absolute 26.8 (H) 1.8 - 8.0 K/UL    Lymphocytes Absolute 1.2 0.8 - 3.5 K/UL    Monocytes Absolute 1.5 (H) 0.0 - 1.0 K/UL    Eosinophils  Absolute 0.0 0.0 - 0.4 K/UL    Basophils Absolute 0.0 0.0 - 0.1 K/UL    Absolute Immature Granulocyte 0.0 0.00 - 0.04 K/UL    Differential Type MANUAL      RBC Comment ANISOCYTOSIS  1+       Magnesium    Collection Time: 06/11/22  5:13 AM   Result Value Ref Range    Magnesium 2.8 (H) 1.6 - 2.4 mg/dL   Phosphorus    Collection Time: 06/11/22  5:13 AM   Result Value Ref Range    Phosphorus 6.4 (H) 2.6 - 4.7 MG/DL   POCT Glucose    Collection Time: 06/11/22  9:01 AM   Result Value Ref Range    POC Glucose 108 65 - 117 mg/dL    Performed by: Reinaldo Meeker RN    POCT Glucose    Collection Time: 06/11/22 11:48 AM   Result Value Ref Range    POC Glucose 113 65 - 117 mg/dL    Performed by: Reinaldo Meeker RN        Data Review images and reports reviewed    Assessment:     Principal Problem:    Gastric perforation (HCC)  Active Problems:    Type 2 diabetes mellitus with hyperglycemia, without long-term current use of insulin (HCC)    Intestinal obstruction (HCC)    Severe sepsis (HCC)    Septic shock (HCC)    Escherichia coli septicemia (HCC)    Liver abscess    Pneumoperitoneum    Acute respiratory failure with hypoxia (HCC)    AKI (acute kidney injury) (HCC)    Multi-organ failure with heart failure (HCC)    Thrombocytopenia (HCC)    E coli bacteremia    Hepatic abscess    Peripheral arterial disease (HCC)    Hyperbilirubinemia    Gram negative sepsis (HCC)    Septicemia (HCC)  Resolved Problems:    * No resolved hospital problems.  *      Plan/Recommendations/Medical Decision Making:     Now on HD  E coli in hepatic drain cxs  Continue supportive care per intensivist service.  CT looked good, decreased hepatic abscess collection. Agree with regular flushing  Change out NG tube for Dobhoff after failed speech eval  JP drains out once taking po well    Callie Fielding, MD  [facility]

## 2022-06-11 NOTE — Progress Notes (Signed)
Infectious Disease Progress    Impression    Gram negative sepsis  S/p Septic shock  Multiorgan failure    E. coli bacteremia  Blood cultures 7/31+ for E. coli   2/2 LAC (pan sensitive)   Cultures 8/4 negative    Acute abdomen  Pneumoperitoneum  S/p diagnostic laparoscopy, laparotomy  EGD 7/31  Findings of cloudy fluid in right upper quadrant, yellow/white  Inflammatory peel over lesser curve of stomach, inferior lobe of liver  No gross perforation identified  Pancreas and retroperitoneum appeared edematous  Intra-Op fluid culture + for E. Coli (pansensitive)    Hepatic abscess  Gas and fluid containing collection 7.1 x 10.8 x 5.4 cm  In anterior left lobe of liver, patchy areas of hypodensity right lobe  4 x 4.5 x 5.5 cm collection  S/p drainage by IR 8/4.Cultures + for  few E.coli  Repeat CT 8/7 shows reduction in size of hepatic abscess    Acute hypoxic respiratory failure  Intubated 7/31, s/p extubation 8/7    AKI  Cr 4.53  S/p CV VH  Currently on HD    Multiorgan failure  Coagulopathy    Thrombocytopenia  improving platelets 173      Transaminitis  Improved    Hyperbilirubinemia  Bilirubin 8.5    Diabetes type 2  Hyperglycemia  A1c 7.7    Obesity  BMI 34.76      Plan  Continue Ceftriaxone, Flagyl IV  Patient will require antimicrobials until complete  Resolution of abscess, usual course 4 to 6 weeks  Plan to DC Flagyl after  total 2 weeks of anaerobic coverage  ID service to follow.      Extensive review of chart notes, labs, imaging, cultures done  Additionally review of done:  D/w ICU team  Jaime [name]  is a 76 y/o male PMHx of HTN, HLD and T2DM admitted 7/31 for septic shock in the setting of perforated viscus.  Presented to ED via EMS c/o approximately 2 days of severe abdominal pain, nausea and vomiting. He was in town from [facility] [place] for the [employer]  ED work- up revealed  B/P 80/42, HR 112, Afebrile, Leukocytosis 16.6, lactic acidosis 10,  AKI, Cr 4.06. CT A/P demonstrated perforated viscous.  Received 2L LR, started on Levophed, Vancomycin/Zosyn. Surgery consulted & to OR for emergent exploratory laparotomy.  Did not reveal perforation but fluid collection in the right upper quadrant.  Cultures were sent and back with E. coli pansensitive.  Blood cultures done on 7/31+ for pansensitive E. coli 2/2 LAC.  Patient transferred to ICU post op early 7/31.  Increasing pressor needs, shock liver, thrombocytopenia, coagulopathic, anuric AKI with developing hyperkalemia and started on CHHF. Stress dose steroids and thiamine added for refractory shock. Abx changed to Vanc and merrem.   On 8/1 patient developed VT episodes with pulse, shocked x 3 and amio boluses 150 mg each x 3 fallowed by amio drip. Cardio consulted.  On 8/3  He was intubated and sedated and 2 pressors.   Patient remains intubated and sedated today.  He has been found found to have liver abscesses on CT abd, plan to place drain today by IR.  Patient is on lower dose of pressors.  Antimicrobial therapy  to date  Cefepime x 1 dose         7/31  Zosyn  x 1 dose              7/31  Vancomycin x 1 dose  7/31  Metronidazole                 7/31-8/2 and 8/3-8/4  Ceftriaxone                     8/2-8/4  Eraxis                            7/31- 8/4        Pt seen today.extubated & awake.    Discoloration of toes noted  D/w ICU Team    History reviewed. No pertinent past medical history.    Past Surgical History:   Procedure Laterality Date    BLADDER SURGERY N/A 06/01/2022    ENDOSCOPY OF ILEAL CONDUIT performed by Guinevere Ferrari, MD at [facility]    CT VISCERAL PERCUTANEOUS DRAIN  06/05/2022    CT VISCERAL PERCUTANEOUS DRAIN 06/05/2022 [facility] CT    LAPAROSCOPY N/A 06/01/2022    LAPAROSCOPY DIAGNOSTIC performed by Guinevere Ferrari, MD at [facility]    LAPAROTOMY N/A 06/01/2022    LAPAROTOMY EXPLORATORY performed by Guinevere Ferrari, MD at [facility]       Allergies   Allergen Reactions    Augmentin [Amoxicillin-Pot Clavulanate] Hives     Tolerated ceftriaxone 06/2022     Codeine      Unknown reaction       Social Connections: Not on file       No family status information on file.           Review of Systems - Negative except those mentioned in H&P      PHYSICAL EXAM:  General:          Awake, in no distress  EENT:              EOMI. Anicteric sclerae. MMM  Resp:               CTA bilaterally, no wheezing or rales.  No accessory muscle use  CV:                  Regular  rhythm,  No edema  GI:                   Soft, Non distended, Non tender.  +Bowel sounds  Neurologic:      Alert and oriented X 3, normal speech,   Psych:             Good insight. Not anxious nor agitated  Skin:                No rashes.  No jaundice.  Extremities :  No edema, discoloration of  toes++.     Waymon Amato, MD Jerrel Ivory

## 2022-06-11 NOTE — Plan of Care (Signed)
Problem: Occupational Therapy - Adult  Goal: By Discharge: Performs self-care activities at highest level of function for planned discharge setting.  See evaluation for individualized goals.  Description: FUNCTIONAL STATUS PRIOR TO ADMISSION:     , ADL Assistance: Independent,  ,  ,  ,  ,  ,  , Ambulation Assistance: Independent, Transfer Assistance: Independent, Active Driver: Yes     HOME SUPPORT: Patient lived with spouse and was independent.    Occupational Therapy Goals:  Initiated 06/10/2022  1.  Patient will perform grooming at bed level with Moderate Assist within 7 day(s).  2.  Patient will perform self-feeding with Moderate Assist within 7 day(s).  3.  Patient will perform upper body dressing with Maximal Assist within 7 day(s).  4.  Patient will complete supine>sit with mod assist x2  within 7 day(s).  5.  Patient will tolerate sitting EOB with fair sitting balance in preparation for ADLs within 7 day(s).      Outcome: Progressing     OCCUPATIONAL THERAPY TREATMENT  Patient: Jaime [name] (76 y.o. male)  Date: 06/11/2022  Primary Diagnosis: Septicemia (HCC) [A41.9]  Gastric perforation (HCC) [K25.5]  Perforated abdominal viscus [R19.8]  Procedure(s) (LRB):  LAPAROTOMY EXPLORATORY (N/A)  LAPAROSCOPY DIAGNOSTIC (N/A)  ENDOSCOPY OF ILEAL CONDUIT (N/A) 10 Days Post-Op   Precautions: NPO                Chart, occupational therapy assessment, plan of care, and goals were reviewed.    ASSESSMENT  Patient continues to benefit from skilled OT services and is progressing towards goals. Pt demonstrated increased initiation of BLEs towards EOB in preparation for bed mobility. Pt tolerated sitting Eob ~10 minutes with fluctuating levels of assist CGA-max assist for sitting balance. Facilitated BUE and cervical AROM with pt tolerating fair, with profoundly weak UE and limited shoulder and elbow flexion. VSS on RA today. Pt continues to require max assist x2 for bed mobility and total assist for ADLs at this time and  remains well below his baseline.              PLAN :  Patient continues to benefit from skilled intervention to address the above impairments.  Continue treatment per established plan of care to address goals.    Recommend with staff: encourage BUE AROM    Recommend next OT session: continue POC    Recommendation for discharge: (in order for the patient to meet his/her long term goals): Therapy 3 hours/day 5-7 days/week    Other factors to consider for discharge: patient's current support system is unable to meet their requirements for physical assistance, high risk for falls, not safe to be alone, and concern for safely navigating or managing the home environment    IF patient discharges home will need the following DME: continuing to assess with progress       SUBJECTIVE:   Patient stated "I'm feeling fine."    OBJECTIVE DATA SUMMARY:   Cognitive/Behavioral Status:  Orientation  Overall Orientation Status: Within Normal Limits  Orientation Level: Oriented to situation;Oriented to person;Disoriented to time;Oriented to place (unable to state year or date, stated it was June)       Functional Mobility and Transfers for ADLs:  Bed Mobility:  Bed Mobility Training  Bed Mobility Training: Yes  Interventions: Verbal cues;Tactile cues  Rolling: Maximum assistance;Assist X2  Supine to Sit: Maximum assistance;Assist X2  Sit to Supine: Maximum assistance;Assist X2  Scooting: Maximum assistance;Assist X2     Transfers:  Art therapist: No      Balance:     Balance  Sitting: Impaired  Sitting - Static: Fair (occasional);Poor (constant support)  Sitting - Dynamic: Poor (constant support)      ADL Intervention:                                                                           Pt tolerated sitting EOB for ~10 minutes, requiring CGA-max assist for sitting balance, primarily with R lateral leaning noted. Pt required mod/max verbal and tactile cues to initiate trunk repositioning.     OT instructed pt  in elbow flex/ext, cervical AROM, and shoulder flexion in sitting. Pt able to activate deltoids, but unable to elevate past ~5 degrees flexion. Pt demonstrated <25% WNL elbow flexion.             Pain Rating:  0/10   Pain Intervention(s):         Activity Tolerance:   Fair  and SpO2 stable on room air  Please refer to the flowsheet for vital signs taken during this treatment.    After treatment:   Patient left in no apparent distress in bed, Call bell within reach, Side rails x3, and RN present    COMMUNICATION/EDUCATION:   The patient's plan of care was discussed with: physical therapist and registered nurse    Patient Education  Education Given To: Patient  Education Provided: Role of Therapy;Plan of Microbiologist  Education Method: Verbal  Barriers to Learning: None  Education Outcome: Continued education needed;Verbalized understanding    Thank you for this referral.  Robie Ridge, OT  Minutes: 28

## 2022-06-11 NOTE — Progress Notes (Signed)
NAME: Jaime [name]        DOB:  [birth year]        MRN:  [id number]                     Assessment   :                                               Plan:  AKI  Sepsis  Hypotension>>HTN.  Resp failure  DM-2  Shock liver       AKI related to sepsis and shock. CT showed renal cysts.  anuric (CT on 8/7 with empty bladder and no hydro; 8/10 bladder scan no pvr).  Right IJ quinton 8/8     CVVHD started 7/31 until 8/6; qod iHD for now; no HD today - HD with UF as tolerated tomorrow    Watch for kidney recovery - none so far             Subjective:     Chief Complaint:   sleeping, awoke without difficulty. Denied complaint.    Review of Systems:    Symptom Y/N Comments  Symptom Y/N Comments   Fever/Chills    Chest Pain     Poor Appetite    Edema     Cough    Abdominal Pain     Sputum    Joint Pain     SOB/DOE    Pruritis/Rash     Nausea/vomit    Tolerating PT/OT     Diarrhea    Tolerating Diet     Constipation    Other       Could not obtain due to:      Objective:     VITALS:   Last 24hrs VS reviewed since prior progress note. Most recent are:  Vitals:    06/11/22 0500   BP: (!) 114/55   Pulse: 82   Resp: 23   Temp:    SpO2: 95%       Intake/Output Summary (Last 24 hours) at 06/11/2022 0272  Last data filed at 06/11/2022 0200  Gross per 24 hour   Intake 1548.9 ml   Output 3205 ml   Net -1656.1 ml        Telemetry Reviewed:     PHYSICAL EXAM:  General: Intubated, alert awake  +ETT  Clear  RRR  +edema- L > R  Purple toes+      Lab Data Reviewed: (see below)    Medications Reviewed: (see below)    PMH/SH reviewed - no change compared to H&P  ________________________________________________________________________  Care Plan discussed with:  Patient     Family      RN     Care Manager                    Consultant:          Comments   >50% of visit spent in counseling and coordination of care        ________________________________________________________________________  Tanna Savoy, MD     Procedures: see electronic medical records for all procedures/Xrays and details which  were not copied into this note but were reviewed prior to creation of Plan.      LABS:  Recent Labs     06/10/22  0358 06/11/22  0513   WBC 34.9* 29.5*  HGB 9.5* 8.8*   HCT 27.9* 25.6*   PLT 166 173       Recent Labs     06/09/22  0342 06/10/22  0358 06/11/22  0513   NA 136 133* 133*   K 4.6 5.3* 5.2*   CL 107 104 103   CO2 21 20* 21   BUN 59* 83* 63*   MG 2.6* 2.8* 2.8*   PHOS 4.5 6.1* 6.4*       Recent Labs     06/09/22  0342   GLOB 3.6       MEDICATIONS:  Current Facility-Administered Medications   Medication Dose Route Frequency    insulin NPH (HumuLIN N;NovoLIN N) injection vial 16 Units  16 Units SubCUTAneous 2 times per day    albumin human 25% IV solution 25 g  25 g IntraVENous PRN    amiodarone (CORDARONE) tablet 400 mg  400 mg Oral Daily    amiodarone (NEXTERONE) 150 mg in dextrose 5% 100 ml  150 mg IntraVENous PRN    heparin (porcine) 1000 UNIT/ML injection 1,200 Units  1,200 Units IntraCATHeter PRN    And    heparin (porcine) 1000 UNIT/ML injection 1,000 Units  1,000 Units IntraCATHeter PRN    polyethylene glycol (GLYCOLAX) packet 17 g  17 g Oral Daily    sennosides-docusate sodium (SENOKOT-S) 8.6-50 MG tablet 2 tablet  2 tablet Oral BID    heparin (porcine) injection 5,000 Units  5,000 Units SubCUTAneous 3 times per day    lansoprazole (PREVACID SOLUTAB) disintegrating tablet 30 mg  30 mg Per NG tube Daily    heparin (porcine) injection 1,500 Units  1,500 Units IntraCATHeter PRN    And    heparin (porcine) injection 1,500 Units  1,500 Units IntraCATHeter PRN    cefTRIAXone (ROCEPHIN) 2,000 mg in sodium chloride 0.9 % 50 mL IVPB (mini-bag)  2,000 mg IntraVENous Q24H    metronidazole (FLAGYL) 500 mg in 0.9% NaCl 100 mL IVPB premix  500 mg IntraVENous Q8H    hydrALAZINE (APRESOLINE) injection 10 mg  10 mg IntraVENous Q4H  PRN    sodium chloride 0.9 % bolus 100 mL  100 mL IntraVENous PRN    fentaNYL (SUBLIMAZE) injection 50 mcg  50 mcg IntraVENous Q1H PRN    balsum peru-castor oil (VENELEX) ointment   Topical BID    glucose chewable tablet 16 g  4 tablet Oral PRN    dextrose bolus 10% 125 mL  125 mL IntraVENous PRN    Or    dextrose bolus 10% 250 mL  250 mL IntraVENous PRN    glucagon (rDNA) injection 1 mg  1 mg SubCUTAneous PRN    dextrose 10 % infusion   IntraVENous Continuous PRN    insulin lispro (HUMALOG) injection vial 0-4 Units  0-4 Units SubCUTAneous 4 times per day    sodium chloride flush 0.9 % injection 5-40 mL  5-40 mL IntraVENous 2 times per day    sodium chloride flush 0.9 % injection 5-40 mL  5-40 mL IntraVENous PRN    0.9 % sodium chloride infusion   IntraVENous PRN    acetaminophen (TYLENOL) tablet 650 mg  650 mg Oral Q6H PRN    Or    acetaminophen (TYLENOL) suppository 650 mg  650 mg Rectal Q6H PRN    ondansetron (ZOFRAN) injection 4 mg  4 mg IntraVENous Q6H PRN

## 2022-06-11 NOTE — Progress Notes (Signed)
EP/ ARRHYTHMIA Progress Note    Patient ID:  Patient: Jaime [name]  MRN: [id number]  Age: 76 y.o.  DOB: 08/22/1946    Date of  Admission: 05/31/2022 11:25 PM   PCP:  No primary care provider on file.  Usual cardiologist:  Jodelle Red, MD in [facility] [phone]    Assessment:   Sustained monomorphic VT but also with some salvoes earlier this admission when he was quite sick.  Suspect this is ectopic and due to critical illness and catecholamines.  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  Moderate aortic stenosis by outside echo 2020, limited study done here on 8/1.  Sepsis with GNR (E. Coli).  Diagnostic laparoscopy, laparotomy, EGD on 7/31.  IR drain to hepatic abscesses 8/4.  Multiorgan dysfunction, shock.  Improving.  Severe thrombocytopenia, improving.  Full code.    Plan:     Continue oral amiodarone.    Supportive care.  His ability to have VT should be dramatically reduced.  I'll check back on him next week.      [x]        High complexity decision making was performed in this patient at high risk for decompensation with multiple organ involvement.    Jaime [name] is a 76 y.o. male with a history of the above.  I was asked to consult due to recurrent VT requiring repeated amiodarone and shock attempts.     History reviewed. No pertinent past medical history.     Past Surgical History:   Procedure Laterality Date    BLADDER SURGERY N/A 06/01/2022    ENDOSCOPY OF ILEAL CONDUIT performed by 06/03/2022, MD at [facility]    CT VISCERAL PERCUTANEOUS DRAIN  06/05/2022    CT VISCERAL PERCUTANEOUS DRAIN 06/05/2022 [facility] RAD CT    LAPAROSCOPY N/A 06/01/2022    LAPAROSCOPY DIAGNOSTIC performed by 06/03/2022, MD at [facility]    LAPAROTOMY N/A 06/01/2022    LAPAROTOMY EXPLORATORY performed by 06/03/2022, MD at [facility]       Social History     Tobacco Use    Smoking status: Not on file    Smokeless tobacco: Not on file   Substance Use Topics    Alcohol use: Not  on file        No family history on file.     Allergies   Allergen Reactions    Augmentin [Amoxicillin-Pot Clavulanate] Hives     Tolerated ceftriaxone 06/2022    Codeine      Unknown reaction          Current Facility-Administered Medications   Medication Dose Route Frequency    [START ON 06/12/2022] bisacodyl (DULCOLAX) suppository 10 mg  10 mg Rectal Daily PRN    insulin lispro (HUMALOG) injection vial 0-8 Units  0-8 Units SubCUTAneous TID WC    insulin lispro (HUMALOG) injection vial 0-4 Units  0-4 Units SubCUTAneous Nightly    albumin human 25% IV solution 25 g  25 g IntraVENous PRN    amiodarone (CORDARONE) tablet 400 mg  400 mg Oral Daily    amiodarone (NEXTERONE) 150 mg in dextrose 5% 100 ml  150 mg IntraVENous PRN    heparin (porcine) 1000 UNIT/ML injection 1,200 Units  1,200 Units IntraCATHeter PRN    And    heparin (porcine) 1000 UNIT/ML injection 1,000 Units  1,000 Units IntraCATHeter PRN    polyethylene glycol (GLYCOLAX) packet 17 g  17 g Oral Daily    sennosides-docusate sodium (SENOKOT-S)  8.6-50 MG tablet 2 tablet  2 tablet Oral BID    heparin (porcine) injection 5,000 Units  5,000 Units SubCUTAneous 3 times per day    lansoprazole (PREVACID SOLUTAB) disintegrating tablet 30 mg  30 mg Per NG tube Daily    heparin (porcine) injection 1,500 Units  1,500 Units IntraCATHeter PRN    And    heparin (porcine) injection 1,500 Units  1,500 Units IntraCATHeter PRN    cefTRIAXone (ROCEPHIN) 2,000 mg in sodium chloride 0.9 % 50 mL IVPB (mini-bag)  2,000 mg IntraVENous Q24H    metronidazole (FLAGYL) 500 mg in 0.9% NaCl 100 mL IVPB premix  500 mg IntraVENous Q8H    hydrALAZINE (APRESOLINE) injection 10 mg  10 mg IntraVENous Q4H PRN    sodium chloride 0.9 % bolus 100 mL  100 mL IntraVENous PRN    fentaNYL (SUBLIMAZE) injection 50 mcg  50 mcg IntraVENous Q1H PRN    balsum peru-castor oil (VENELEX) ointment   Topical BID    glucose chewable tablet 16 g  4 tablet Oral PRN    dextrose bolus 10% 125 mL  125 mL IntraVENous  PRN    Or    dextrose bolus 10% 250 mL  250 mL IntraVENous PRN    glucagon (rDNA) injection 1 mg  1 mg SubCUTAneous PRN    dextrose 10 % infusion   IntraVENous Continuous PRN    sodium chloride flush 0.9 % injection 5-40 mL  5-40 mL IntraVENous 2 times per day    sodium chloride flush 0.9 % injection 5-40 mL  5-40 mL IntraVENous PRN    0.9 % sodium chloride infusion   IntraVENous PRN    acetaminophen (TYLENOL) tablet 650 mg  650 mg Oral Q6H PRN    Or    acetaminophen (TYLENOL) suppository 650 mg  650 mg Rectal Q6H PRN    ondansetron (ZOFRAN) injection 4 mg  4 mg IntraVENous Q6H PRN       Review of Symptoms:  He cannot communicate.     Objective:      Physical Exam:  Temp (24hrs), Avg:98.6 F (37 C), Min:97.7 F (36.5 C), Max:99.7 F (37.6 C)    Patient Vitals for the past 8 hrs:   Pulse   06/11/22 1545 86   06/11/22 1530 85   06/11/22 1515 85   06/11/22 1500 87   06/11/22 1445 86   06/11/22 1430 90   06/11/22 1415 87   06/11/22 1400 85   06/11/22 1315 86   06/11/22 1300 86   06/11/22 1245 87   06/11/22 1230 87   06/11/22 1215 87   06/11/22 1200 85   06/11/22 1145 84   06/11/22 1130 85   06/11/22 1115 87      Patient Vitals for the past 8 hrs:   Resp   06/11/22 1545 22   06/11/22 1530 23   06/11/22 1515 23   06/11/22 1500 27   06/11/22 1445 24   06/11/22 1430 26   06/11/22 1415 22   06/11/22 1400 24   06/11/22 1315 24   06/11/22 1300 24   06/11/22 1245 26   06/11/22 1230 24   06/11/22 1215 25   06/11/22 1200 26   06/11/22 1145 23   06/11/22 1130 23   06/11/22 1115 24      Patient Vitals for the past 8 hrs:   BP   06/11/22 1545 (!) 123/58   06/11/22 1530 (!) 125/58   06/11/22 1515 (!)  115/54   06/11/22 1430 (!) 144/62   06/11/22 1415 123/66   06/11/22 1400 129/62   06/11/22 1315 137/61   06/11/22 1300 (!) 127/54   06/11/22 1245 (!) 135/56   06/11/22 1230 (!) 130/56   06/11/22 1215 (!) 123/55   06/11/22 1200 (!) 125/57   06/11/22 1145 118/62   06/11/22 1130 (!) 115/52   06/11/22 1115 (!) 123/58           Intake/Output Summary (Last 24 hours) at 06/11/2022 1910  Last data filed at 06/11/2022 1724  Gross per 24 hour   Intake 1233.33 ml   Output 160 ml   Net 1073.33 ml         Nondiaphoretic, not in acute distress.  Unlabored, clear to auscultation bilaterally anteriorly, symmetric air movement.  Regular rate and rhythm, + soft systolic murmur, no pericardial rub.  No peripheral edema.  Extremities without cyanosis or clubbing.  Muscle tone and bulk normal for age.  Skin warm and dry.  No rashes or ulcers visible.  Neuro grossly nonfocal.  No tremor.  Awake and appropriate.    CARDIOGRAPHICS and STUDIES, I reviewed:    Telemetry:  SINUS RHYTHM.    ECG:  Studies reviewed from the last 24 hours.    Echo 8/1:  Left Ventricle Normal left ventricular systolic function with a visually estimated EF of 55 - 60%. Not well visualized. Left ventricle size is normal. Increased wall thickness. Unable to assess wall motion. Diastolic dysfunction present with normal LV EF.   Left Atrium Not well visualized.   Right Ventricle Not well visualized. Normal systolic function.   Right Atrium Not well visualized.   Aortic Valve Not well visualized. No regurgitation. No stenosis.   Mitral Valve Not well visualized. No regurgitation. No stenosis noted.   Tricuspid Valve Not well visualized. No regurgitation. No stenosis noted.   Pulmonic Valve The pulmonic valve was not well visualized.   Aorta Not well visualized. Normal sized sinus of Valsalva.   Pericardium No pericardial effusion.       Labs:  No results for input(s): CPK, CKMB in the last 72 hours.    Invalid input(s): CPKMB, CKNDX, TROIQ  No results found for: CHOL, CHOLX, CHLST, CHOLV, HDL, HDLC, LDL, LDLC, TGLX, TRIGL  No results for input(s): INR, APTT in the last 72 hours.    Invalid input(s): PTP     Recent Labs     06/09/22  0342 06/10/22  0358 06/11/22  0513   NA 136 133* 133*   K 4.6 5.3* 5.2*   CL 107 104 103   CO2 21 20* 21   BUN 59* 83* 63*   PHOS 4.5 6.1* 6.4*   WBC 30.5*  34.9* 29.5*   HGB 9.5* 9.5* 8.8*   HCT 28.6* 27.9* 25.6*   PLT 132* 166 173       Recent Labs     06/09/22  0342   GLOB 3.6       No components found for: GLPOC  No results for input(s): PH, PCO2, PO2 in the last 72 hours.      Tamera Punt, MD 06/11/22 7:10 PM

## 2022-06-11 NOTE — Plan of Care (Signed)
Speech LAnguage Pathology TREATMENT    Patient: Jaime [name] (76 y.o. male)  Date: 06/11/2022  Primary Diagnosis: Septicemia (HCC) [A41.9]  Gastric perforation (HCC) [K25.5]  Perforated abdominal viscus [R19.8]  Procedure(s) (LRB):  LAPAROTOMY EXPLORATORY (N/A)  LAPAROSCOPY DIAGNOSTIC (N/A)  ENDOSCOPY OF ILEAL CONDUIT (N/A) 10 Days Post-Op   Precautions: Aspiration NPO                  ASSESSMENT :  Patient continues to be followed by SLP for dysphagia treatment. Patient with improved respiratory status on this date. Patient now on room air. Vocal quality improved from initial evaluation on 8/9, however still characterized by low volume and breathy quality. Patient with inconsistent throat clearing with ice chips on this date. Patient agreeable to thin liquid trials on this date. Increasingly wet vocal quality as well as weak coughing episode observed following limited thin liquid trials with SPO2 to drop into high 80s. Patient recovered quickly.    Patient will benefit from skilled intervention to address the above impairments.     PLAN :  Recommendations and Planned Interventions:  Diet:  NPO with NGT for primary nutrition/hydration/medication  -- Strict oral care 2-3x/day with suction toothbrush  -- Ice chips OK after oral care  -- SLP will follow acutely       Acute SLP Services: Yes, SLP will continue to follow per plan of care.    Discharge Recommendations: Continue to assess pending progress     SUBJECTIVE:   Patient stated, "they went back" re: family visiting from [facility].    OBJECTIVE:   History reviewed. No pertinent past medical history.  Past Surgical History:   Procedure Laterality Date    BLADDER SURGERY N/A 06/01/2022    ENDOSCOPY OF ILEAL CONDUIT performed by Guinevere Ferrari, MD at [facility] MAIN OR    CT VISCERAL PERCUTANEOUS DRAIN  06/05/2022    CT VISCERAL PERCUTANEOUS DRAIN 06/05/2022 [facility] RAD CT    LAPAROSCOPY N/A 06/01/2022    LAPAROSCOPY DIAGNOSTIC performed by Guinevere Ferrari, MD at [facility] OR    LAPAROTOMY N/A  06/01/2022    LAPAROTOMY EXPLORATORY performed by Guinevere Ferrari, MD at [facility] MA[facility] OR     Prior Level of Function/Home Situation:   Social/Functional History  Lives With: Spouse  Type of Home: House  Home Layout: One level  Home Equipment: None  ADL Assistance: Independent  Ambulation Assistance: Independent  Transfer Assistance: Independent  Active Driver: Yes    Cognitive and Communication Status:  Neurologic State: Alert  Orientation Level: Oriented to person, Oriented to place, and Oriented to situation  Cognition: Follows commands    Dysphagia:  P.O. Trials:  PO Trials  Assessment Method(s): Observation  Patient Position: Upright in bed  Vocal Quality: Low volume;Breathy  Consistency Presented: Ice Chips;Thin  How Presented: SLP-fed/Presented;Spoon;Straw  Bolus Acceptance: No impairment  Bolus Formation/Control: No impairment  Propulsion: No impairment  Aspiration Signs/Symptoms: Change of vocal quality;Throat clear;Weak cough            Respiratory Status/Airway:  Room air                         After treatment:   Patient left in no apparent distress in bed, Call bell left within reach, and Nursing notified    COMMUNICATION/EDUCATION:   The patient's plan of care including recommendations, planned interventions, and recommended diet changes were discussed with: Registered nurse and Patient.    Thank you,  Katy Apo, SLP  Minutes: 15     Problem: SLP Adult - Impaired Swallowing  Goal: By Discharge: Advance to least restrictive diet without signs or symptoms of aspiration for planned discharge setting.  See evaluation for individualized goals.  Description: Speech Pathology Goals  Initiated 06/10/2022    1. Patient will participate in re-evaluation of swallow function within 7 days.  Outcome: Progressing

## 2022-06-11 NOTE — Progress Notes (Addendum)
0700  Bedside shift change report received from [place], Museum/gallery conservator). Assumed care of pt at this time. Report included the following information SBAR, Kardex, Intake/Output, MAR, Recent Results, Heart rhythm and Medications.      1245  Assessment completed, see documentation.  Noted TF held at [facility] for high residuals. Not restarted, BG 135 via AM labs. Insulin NOV-N due at this time. Will d/w team and MD on am rounds.  Currently pt is NPO per SPEECH evaluation (1 ice chip allowed per time).  POC BG 108 at this time.      1030-11  PT/OT in to see. Pt sat side of bed.    1045  Surgery MD in to see. Discussed surgical plan and drainage from JP 1 and 2. Accordion drain flushed Q8 hours with NS 10 cc's.    1200  Started Nepro TF at 2ml/hr. Water flush 40 mls given.  Residual prior to starting    1715  Residual from NGT 350 ml's (Total intake of TD 83ml's/40cc's H20).  TF stopped for now.  Discussed insulin needs and nutrition with Diabetic nurse, changes to orders made.    1740  CHG bath given, linen changed and dressings changed to drains and incision.    1930  BS report to Erskine Squibb, Charity fundraiser

## 2022-06-11 NOTE — Progress Notes (Addendum)
1900 - Received bedside report from [place], [place]    2000 - Assessment done and documented, see flow sheet.  Patient conscious, appeared weak and pale.      2200 - Put on nasal cannula at 2LPM as SPO2-92%.    0000 - No acute changes from previous assessment.  Continue monitored.  All needs attended.    0400 - Blood sample sent to lab fr routine investigation.    0600 - afebrile.  Vitally stable.  No issue overnight.  Denies of pain.  No pressors.      1829 - Bedside report given to Sam, RN.

## 2022-06-11 NOTE — Plan of Care (Signed)
Problem: Physical Therapy - Adult  Goal: By Discharge: Performs mobility at highest level of function for planned discharge setting.  See evaluation for individualized goals.  Description: FUNCTIONAL STATUS PRIOR TO ADMISSION: Patient was independent and active without use of DME. [place]. Denies home O2 use.     HOME SUPPORT PRIOR TO ADMISSION: The patient lived with wife however wife has her own medical issues and cannot provide physical assist.    Physical Therapy Goals  Initiated 06/10/2022  1.  Patient will move from supine to sit and sit to supine, scoot up and down, and roll side to side in bed with contact guard assist within 7 day(s).   2. Patient will sit EOB x 10 minutes with supervision/set-up assist within 7 days.    2.  Patient will perform sit to stand with moderate assistance x2 within 7 day(s).  3.  Patient will transfer from bed to chair and chair to bed with moderate assistance x2 using the least restrictive device within 7 day(s).  4.  Patient will ambulate with moderate assistance x2 for 5 feet with the least restrictive device within 7 day(s).   06/11/2022 1156 by Demetrio Lapping, PT  Outcome: Progressing     Problem: Occupational Therapy - Adult  Goal: By Discharge: Performs self-care activities at highest level of function for planned discharge setting.  See evaluation for individualized goals.  Description: FUNCTIONAL STATUS PRIOR TO ADMISSION:     , ADL Assistance: Independent,  ,  ,  ,  ,  ,  , Ambulation Assistance: Independent, Transfer Assistance: Independent, Active Driver: Yes     HOME SUPPORT: Patient lived with spouse and was independent.    Occupational Therapy Goals:  Initiated 06/10/2022  1.  Patient will perform grooming at bed level with Moderate Assist within 7 day(s).  2.  Patient will perform self-feeding with Moderate Assist within 7 day(s).  3.  Patient will perform upper body dressing with Maximal Assist within 7  day(s).  4.  Patient will complete supine>sit with mod assist x2  within 7 day(s).  5.  Patient will tolerate sitting EOB with fair sitting balance in preparation for ADLs within 7 day(s).      06/11/2022 1244 by Robie Ridge, OT  Outcome: Progressing     Problem: SLP Adult - Impaired Swallowing  Goal: By Discharge: Advance to least restrictive diet without signs or symptoms of aspiration for planned discharge setting.  See evaluation for individualized goals.  Description: Speech Pathology Goals  Initiated 06/10/2022    1. Patient will participate in re-evaluation of swallow function within 7 days.  06/11/2022 1447 by Katy Apo, SLP  Outcome: Progressing

## 2022-06-11 NOTE — Other (Signed)
[place]  [facility]  DIABETES MANAGEMENT CONSULT    Consulted by Provider for advanced nursing evaluation and care for inpatient blood glucose management.    Evaluation and Action Plan   This 76 year old Caucasian male was admitted with RUQ pain and underwent laparotomy 06/01/22. Septic shock with multi-organ failure. Several episodes of Vtach. Remained on vent support, pressors & CRRT. A new finding of liver abscess noted on CT; drain placed & now with less output. There is concern for LLE due to diminished pedal pulse. Ischemia of first 2 toes bilaterally. Per vascular, he has not been a candidate for endovascular or surgical revascularization at this time.     As for BG management in this patient with known Type 2 diabetes, began low dose basal insulin 06/02/22 along with corrective insulin to address impact of steroid. Bgs had been 170-180s but continued to rise. Insulin adjusted on Friday 06/05/22. HC stopped 06/07/22. Bgs in target. State of nutrition strategy in flux. Did not pass swallow test; TF rate continues at 30cc/hr although stopped for several hours this morning. Issues with residual and increased secretions. Bgs 100-130s. Switch to corrective insulin.    Management Rationale Action Plan   Medication   Overriding steroid effect Based on BG pattern    TF running @30cc /hr with goal rate of 40cc/hr (209 CHO/D)   Corrective insulin approach     Additional orders               Initial Presentation   Jaime [name] is a 76 y.o. male admitted 05/31/22 from ER after experiencing RUQ pain associated with generalized weakness, nausea & vomiting for several days PTA. AMS+. Hypotension+. Fever+. Dyspneic+. He is visiting from 06/02/22.  Ectopic atrial tachycardia  ER exam:  Cardiovascular:      Rate and Rhythm: Regular rhythm. Tachycardia present.   Pulmonary:      Comments: He is tachypneic, coarse breath sounds on inspiration at bilateral bases  Abdominal:      Palpations: Abdomen is soft.       Comments: Tender to palpation diffusely in the right sided abdomen without guarding or rebound tenderness   LAB: WBC 16.6. Normal H&H. Low platelets.BG 272/AG 17. AKI. Elevated liver enzymes. NT pro-BNP 20,980. Lipase >3000.     CXR:   Right IJ catheter satisfactory position without pneumothorax. ET   tube and NG tube as above   CT Head: Negative  CT ABd:   Extraluminal air bubbles in the right upper quadrant concerning for gastric   perforation. Inflammatory changes are noted about the tail of the pancreas   extending into the left anterior pararenal space, please correlate with any   evidence of acute pancreatitis. Bilateral lower lobe infiltrates.     [place] reviewed. No pertinent past medical history.     INITIAL DX: Septicemia (HCC) [A41.9]  Gastric perforation (HCC) [K25.5]  Perforated abdominal viscus [R19.8]     Current Treatment     TX: 06/01/22 LAPAROTOMY EXPLORATORY  LAPAROSCOPY DIAGNOSTIC ENDOSCOPY OF ILEAL CONDUIT  EGD    Hospital Course   Clinical progress has been complicated by multi-organ failure.   06/01/22 Dialysis  06/02/22 In septic shock with multi-organ failure. On vent support, sedation, pressor support and bicarb infusion. On Abx. Two runs of VT this morning requiring defibrillation; cardiology conversation anticipated. CRRT+  06/03/22 Remains on vent support C vent Fi02 50%/Peep 7. Low grade fever; on Abx. Steroids+. Afib/bradycardic. Pressors+. CRRT+.   06/04/22 Remains on vent  support and pressors. On Abx. Concern for LLE. CRRT. CT: Enlarged left hepatic abscess contain gas with other areas of probable abscess/phlegmonous infection.  06/05/22 On AC vent support. On Amio, vaso, levo & Fentanyl infusions. OG draining green fluid. CRRT+.  Vascular: Patient remains at risk for progressive deterioration and limb loss but needs to be weaned from pressor support before intervention is safe.  06/08/22 On Spon vent support; plans to extubate after CT today. Fentanyl pushes. H&H low. CRRT+. Mottled &  blue toes bilaterally. Vascular AKI study to be completed  06/09/22 Alert. 02NC. Amio infusion+. Pedal pulses returning (no need to use doppler)  06/10/22 Alert. 02NC. NSR. Off pressors. TF running. Swallow eval today => may begin CL. Undergoing hemodialysis now. Continued cyanosis of bilateral toes.  06/11/22 Alert & conversational today.    Diabetes History   Type 2 diabetes on metformin therapy  Checks Bgs and they are in the 100s  Is physically active    Diabetes-related Medical History deferred    Diabetes Medication History - based on PTA list  Drug class Currently in use   Biguanide [x]  Metformin (Glucophage)  []  Metformin ER (Glucophage XL)     Diabetes self-management practices: deferred  Overall evaluation:    [x]  Unknown A1c     Subjective   "Yes, I have diabetes."     Objective   Physical exam  General Obese male in no acute distress  Neuro  Alert  Vital Signs   Vitals:    06/11/22 1545   BP: (!) 123/58   Pulse: 86   Resp: 22   Temp:    SpO2: 91%     Extremities No foot wounds    Diabetic foot exam:    Left Foot     Visual Exam: Cool & dry. Thickened toenails    Pulse DP: +1   Right Foot   Visual Exam: Cool & dry. Thickened toenails    Pulse DP: +1      Laboratory  Recent Labs     06/09/22  0342 06/10/22  0358 06/11/22  0513   WBC 30.5* 34.9* 29.5*   HGB 9.5* 9.5* 8.8*   HCT 28.6* 27.9* 25.6*   MCV 88.8 87.5 86.5   PLT 132* 166 173       Recent Labs     06/09/22  0342 06/10/22  0358 06/11/22  0513   NA 136 133* 133*   K 4.6 5.3* 5.2*   CL 107 104 103   CO2 21 20* 21   PHOS 4.5 6.1* 6.4*   BUN 59* 83* 63*   CREATININE 3.85* 5.24* 4.53*       Lab Results   Component Value Date    ALT 162 (H) 06/09/2022    AST 142 (H) 06/09/2022    ALKPHOS 215 (H) 06/09/2022    BILITOT 9.0 (H) 06/09/2022     No results found for: TSH, TSHREFLEX, TSHFT4, TSHELE, TSH3GEN, TSHHS   Lab Results   Component Value Date    LABA1C 7.7 (H) 06/02/2022     Factors impacting BG management  Factor Dose Comments   Nutrition:  TF   40cc/hr    (209gms CHO)   Running @30cc /hr now   Pain Fent PRN    Infection Rocephin Q24 hrs WBC elevated   Other:   Kidney function  Liver function   AKI  Liver enzymes elevated      Blood glucose pattern      Significant diabetes-related events over  the past 24-72 hours  05/31/22 Admission BG 233  06/01/22 Dex 4mg  => BG 100s => HC started  06/02/22 Bgs into 200s  06/03/22 Bgs in 170-180s  06/04/22 Bgs rising abit. HC reduced frequency today. Trickle feeds to begin  06/05/22 Bgs rising into 200s  06/08/22 Bgs in target  06/09/22 Bgs rising with start of TF at higher rate  06/10/22 Bgs rose abit with TF @30cc /hr  06/11/22 Bgs 108, 113. No insulin given    Assessment and Nursing Intervention   Nursing Diagnosis Risk for unstable blood glucose pattern   Nursing Intervention Domain 5250 Decision-making Support   Nursing Interventions Examined current inpatient diabetes/blood glucose control   Explored factors facilitating and impeding inpatient management  Explored corrective strategies with patient and responsible inpatient provider   Informed patient of rational for insulin strategy while hospitalized     Billing Code(s)   []  99233 IP subsequent hospital care - 50 minutes   [x]  IP subsequent hospital care - 35 minutes   []  99231 IP subsequent hospital care - 25 minutes   []  99221 IP initial hospital care - 40 minutes     Before making these care recommendations, I personally reviewed the hospitalization record, including notes, laboratory & diagnostic data and current medications, and examined the patient at the bedside (circumstances permitting) before determining care. More than fifty (50) percent of the time was spent in patient counseling and/or care coordination.  Total minutes: 35    08/11/22, APRN - CNS  Clinical Nurse Specialist - Diabetes & endocrine disorders  [facility]  Access via Perfect Serve

## 2022-06-12 ENCOUNTER — Inpatient Hospital Stay: Admit: 2022-06-12 | Payer: MEDICARE

## 2022-06-12 LAB — CBC WITH AUTO DIFFERENTIAL
Absolute Immature Granulocyte: 0 10*3/uL (ref 0.00–0.04)
Band Neutrophils: 1 %
Basophils %: 0 % (ref 0–1)
Basophils Absolute: 0 10*3/uL (ref 0.0–0.1)
Eosinophils %: 0 % (ref 0–7)
Eosinophils Absolute: 0 10*3/uL (ref 0.0–0.4)
Hematocrit: 26.6 % — ABNORMAL LOW (ref 36.6–50.3)
Hemoglobin: 9.1 g/dL — ABNORMAL LOW (ref 12.1–17.0)
Immature Granulocytes: 0 % (ref 0.0–0.5)
Lymphocytes %: 5 % — ABNORMAL LOW (ref 12–49)
Lymphocytes Absolute: 1.5 10*3/uL (ref 0.8–3.5)
MCH: 29.6 PG (ref 26.0–34.0)
MCHC: 34.2 g/dL (ref 30.0–36.5)
MCV: 86.6 FL (ref 80.0–99.0)
MPV: 12.9 FL (ref 8.9–12.9)
Monocytes %: 9 % (ref 5–13)
Monocytes Absolute: 2.7 10*3/uL — ABNORMAL HIGH (ref 0.0–1.0)
Neutrophils %: 85 % — ABNORMAL HIGH (ref 32–75)
Neutrophils Absolute: 26.1 10*3/uL — ABNORMAL HIGH (ref 1.8–8.0)
Nucleated RBCs: 0 PER 100 WBC
Platelets: 215 10*3/uL (ref 150–400)
RBC: 3.07 M/uL — ABNORMAL LOW (ref 4.10–5.70)
RDW: 17.9 % — ABNORMAL HIGH (ref 11.5–14.5)
WBC: 30.3 10*3/uL — ABNORMAL HIGH (ref 4.1–11.1)
nRBC: 0 10*3/uL (ref 0.00–0.01)

## 2022-06-12 LAB — EKG 12-LEAD
Atrial Rate: 88 {beats}/min
Diagnosis: NORMAL
P Axis: 41 degrees
P-R Interval: 170 ms
Q-T Interval: 450 ms
QRS Duration: 160 ms
QTc Calculation (Bazett): 544 ms
R Axis: -8 degrees
T Axis: 111 degrees
Ventricular Rate: 88 {beats}/min

## 2022-06-12 LAB — PHOSPHORUS: Phosphorus: 9 MG/DL — ABNORMAL HIGH (ref 2.6–4.7)

## 2022-06-12 LAB — BASIC METABOLIC PANEL
Anion Gap: 9 mmol/L (ref 5–15)
BUN: 93 MG/DL — ABNORMAL HIGH (ref 6–20)
Bun/Cre Ratio: 16 (ref 12–20)
CO2: 20 mmol/L — ABNORMAL LOW (ref 21–32)
Calcium: 7.8 MG/DL — ABNORMAL LOW (ref 8.5–10.1)
Chloride: 102 mmol/L (ref 97–108)
Creatinine: 5.76 MG/DL — ABNORMAL HIGH (ref 0.70–1.30)
Est, Glom Filt Rate: 10 mL/min/{1.73_m2} — ABNORMAL LOW (ref >60)
Glucose: 116 mg/dL — ABNORMAL HIGH (ref 65–100)
Potassium: 6.4 mmol/L — ABNORMAL HIGH (ref 3.5–5.1)
Sodium: 131 mmol/L — ABNORMAL LOW (ref 136–145)

## 2022-06-12 LAB — POCT GLUCOSE
POC Glucose: 106 mg/dL (ref 65–117)
POC Glucose: 98 mg/dL (ref 65–117)
POC Glucose: 94 mg/dL (ref 65–117)
POC Glucose: 85 mg/dL (ref 65–117)

## 2022-06-12 LAB — MAGNESIUM: Magnesium: 3 mg/dL — ABNORMAL HIGH (ref 1.6–2.4)

## 2022-06-12 MED FILL — HEPARIN SODIUM (PORCINE) 5000 UNIT/ML IJ SOLN: 5000 UNIT/ML | INTRAMUSCULAR | Qty: 1

## 2022-06-12 MED FILL — HEPARIN SODIUM (PORCINE) 1000 UNIT/ML IJ SOLN: 1000 UNIT/ML | INTRAMUSCULAR | Qty: 10

## 2022-06-12 MED FILL — METRONIDAZOLE 500 MG/100ML IV SOLN: 500 MG/100ML | INTRAVENOUS | Qty: 100

## 2022-06-12 MED FILL — CEFTRIAXONE SODIUM 2 G IJ SOLR: 2 g | INTRAMUSCULAR | Qty: 2000

## 2022-06-12 MED FILL — ALBUTEIN 25 % IV SOLN: 25 % | INTRAVENOUS | Qty: 100

## 2022-06-12 MED FILL — SENNA PLUS 8.6-50 MG PO TABS: ORAL | Qty: 2

## 2022-06-12 MED FILL — AMIODARONE HCL 200 MG PO TABS: 200 MG | ORAL | Qty: 2

## 2022-06-12 NOTE — Other (Signed)
[name] [phone]       06/12/22 0825   Vital Signs   BP (!) 130/58   Temp 98 F (36.7 C)   Pulse 77   Respirations 23   SpO2 98 %   Pain Assessment   Pain Assessment None - Denies Pain   Treatment   Time On 0825   Treatment Goal 2000  (as tolerated)   Observations & Evaluations   Level of Consciousness 0   Heart Rhythm Regular   Respiratory Quality/Effort Dyspnea with exertion   O2 Device Nasal cannula   Skin Color Jaundice;Pale   Skin Condition/Temp Dry;Warm   Appetite   (NPO - tube feeds on hold)   Abdomen Inspection Obese   Bowel Sounds (All Quadrants) Active   Edema Generalized   RUE Edema Pitting   LUE Edema Pitting   RLE Edema Pitting   LLE Edema Pitting   Technical Checks   Dialysis Machine No. [id number]   RO Machine Number [id number]   Dialyzer Lot No. [id number]   Tubing Lot Number [id number]   All Connections Secure Yes   NS Bag Yes   Saline Line Double Clamped Yes   Dialyzer Revaclear 300   Prime Volume (mL) 250 mL   ICEBOAT I;C;E;B;O;A;T   RO Machine Log Sheet Completed Yes   Machine Alarm Self Test Completed;Passed   Child psychotherapist Function   Extracorporeal Chemical engineer Conductivity 13.8   Manual Conductivity 13.6   Manual Ph 7.2   Bleach Test (Neg) Yes   Bath Temperature 98.6 F (37 C)   Treatment Initiation   Dialyze Hours 3.5   Treatment  Initiation Universal Precautions maintained;Lines secured to patient;Connections secured;Prime given;Venous Parameters set;Arterial Parameters set;IT consultant engaged;Saline line double clamped;Dialyzer;Revaclear Dialyzer   Dialysis Bath   K+ (Potassium) 2   Ca+ (Calcium) 2.5   Na+ (Sodium) 138   HCO3 (Bicarb) 30   Bicarbonate Concentrate Lot No. [id number]   Acid Concentrate Lot No. [id number]   Hemodialysis Central Access Right Neck   Placement Date/Time: 06/09/22 1830   Present on Admission/Arrival: No  Inserted by: dr. Rutherford Limerick  Orientation: Right  Access Location: Neck   Continued need for line? Yes    Site Assessment Clean, dry & intact   Venous Lumen Status Blood return noted;Flushed;Infusing   Arterial Lumen Status Brisk blood return;Flushed;Infusing   Alcohol Cap Used No   Line Care Connections checked and tightened;Ports disinfected   Dressing Type Bacteriocidal;Transparent   Date of Last Dressing Change 06/09/22   Dressing Status Clean, dry & intact   Primary RN SBAR: Cyndee Brightly, RN  Patient Education: Procedural/Infection control    0800 - Potassium 6.4. Doctor on call contacted (Dr. Nicholas Lose) OK to use current orders 2K 2.5Ca    Hepatitis B Surface Ag   Date/Time Value Ref Range Status   06/01/2022 10:08 PM <0.10 Index Final     Hep B S Ab   Date/Time Value Ref Range Status   06/01/2022 10:08 PM <3.10 mIU/mL Final         06/12/22 1155   Vital Signs   BP (!) 127/58   Temp 98.4 F (36.9 C)   Pulse 86   Respirations 23   SpO2 98 %   Pain Assessment   Pain Assessment None - Denies Pain   Post-Hemodialysis Assessment   Post-Treatment Procedures Catheter capped, clamped and heparinized x 2 ports   Machine Disinfection Process Acid/Vinegar Clean;Heat Disinfect;Exterior Musician  Rinseback Volume (ml) 250 ml   Blood Volume Processed (Liters) 68.5 l/min   Dialyzer Clearance Lightly streaked   Duration of Treatment (minutes) 210 minutes   Hemodialysis Intake (ml) 500 ml   Hemodialysis Output (ml) 2500 ml   NET Removed (ml) 2000   Tolerated Treatment Good   Interventions Taken Medication  (Albumin)   Patient Response to Treatment Tolerated tx well   Edema Generalized   RUE Edema Pitting   LUE Edema Pitting   RLE Edema Pitting   LLE Edema Pitting   Time Off 1155   Patient Disposition Remain in ICU/ED  (Bedside tx)

## 2022-06-12 NOTE — Other (Signed)
[facility]  DIABETES MANAGEMENT CONSULT    Consulted by Provider for advanced nursing evaluation and care for inpatient blood glucose management.    Evaluation and Action Plan   This 76 year old Caucasian male was admitted with RUQ pain and underwent laparotomy 06/01/22. Was septic shock with multi-organ failure. Several episodes of Vtach. Remained on vent support, pressors & CRRT. A new finding of liver abscess noted on CT; drain placed & now with less output. There is concern for LLE due to diminished pedal pulse. Ischemia of first 2 toes bilaterally. Per vascular, he has not been a candidate for endovascular or surgical revascularization at this time. Now off vent.     As for BG management in this patient with known Type 2 diabetes, began low dose basal insulin 06/02/22 along with corrective insulin to address impact of steroid. Bgs had been 170-180s but continued to rise. Insulin adjusted on 06/05/22. HC stopped 06/07/22. Bgs in target. High residuals resulted in TF being stopped; hence, did not required any insulin yesterday 06/11/22. Has not passed swallow test again today. Will use corrective approach until TF back in place.    Management Rationale Action Plan   Medication   Overriding steroid effect     When/if TF restarted and running at goal rate of 40cc/hr (209 CHO/D), will need 20 units of total insulin/D - either 20 units of Lantus daily or 10 units of NPH twice daily   Corrective insulin approach       Additional orders               Initial Presentation   Jaime [name] is a 76 y.o. male admitted 05/31/22 from ER after experiencing RUQ pain associated with generalized weakness, nausea & vomiting for several days PTA. AMS+. Hypotension+. Fever+. Dyspneic+. He is visiting from [place].  Ectopic atrial tachycardia  ER exam:  Cardiovascular:      Rate and Rhythm: Regular rhythm. Tachycardia present.   Pulmonary:      Comments: He is tachypneic, coarse breath sounds on  inspiration at bilateral bases  Abdominal:      Palpations: Abdomen is soft.      Comments: Tender to palpation diffusely in the right sided abdomen without guarding or rebound tenderness   LAB: WBC 16.6. Normal H&H. Low platelets.BG 272/AG 17. AKI. Elevated liver enzymes. NT pro-BNP 20,980. Lipase >3000.     CXR:   Right IJ catheter satisfactory position without pneumothorax. ET   tube and NG tube as above   CT Head: Negative  CT ABd:   Extraluminal air bubbles in the right upper quadrant concerning for gastric   perforation. Inflammatory changes are noted about the tail of the pancreas   extending into the left anterior pararenal space, please correlate with any   evidence of acute pancreatitis. Bilateral lower lobe infiltrates.     BS:JGGEZMO reviewed. No pertinent past medical history.     INITIAL DX: Septicemia (HCC) [A41.9]  Gastric perforation (HCC) [K25.5]  Perforated abdominal viscus [R19.8]     Current Treatment     TX: 06/01/22 LAPAROTOMY EXPLORATORY  LAPAROSCOPY DIAGNOSTIC ENDOSCOPY OF ILEAL CONDUIT  EGD    Hospital Course   Clinical progress has been complicated by multi-organ failure.   06/01/22 Dialysis  06/02/22 In septic shock with multi-organ failure. On vent support, sedation, pressor support and bicarb infusion. On Abx. Two runs of VT this morning requiring defibrillation; cardiology conversation anticipated. CRRT+  06/03/22 Remains on vent  support C vent Fi02 50%/Peep 7. Low grade fever; on Abx. Steroids+. Afib/bradycardic. Pressors+. CRRT+.   06/04/22 Remains on vent support and pressors. On Abx. Concern for LLE. CRRT. CT: Enlarged left hepatic abscess contain gas with other areas of probable abscess/phlegmonous infection.  06/05/22 On AC vent support. On Amio, vaso, levo & Fentanyl infusions. OG draining green fluid. CRRT+.  Vascular: Patient remains at risk for progressive deterioration and limb loss but needs to be weaned from pressor support before intervention is safe.  06/08/22 On Spon vent support;  plans to extubate after CT today. Fentanyl pushes. H&H low. CRRT+. Mottled & blue toes bilaterally. Vascular AKI study to be completed  06/09/22 Alert. 02NC. Amio infusion+. Pedal pulses returning (no need to use doppler)  06/10/22 Alert. 02NC. NSR. Off pressors. TF running. Swallow eval today => may begin CL. Undergoing hemodialysis now. Continued cyanosis of bilateral toes.  06/11/22 Alert & conversational today  06/12/22 A&O. Failed swallow; allowed ice chips. Podiatry to see for LE ischemia    Diabetes History   Type 2 diabetes on metformin therapy  Checks Bgs and they are in the 100s  Is physically active    Diabetes-related Medical History deferred    Diabetes Medication History - based on PTA list  Drug class Currently in use   Biguanide [x]  Metformin (Glucophage)  []  Metformin ER (Glucophage XL)     Diabetes self-management practices: deferred  Overall evaluation:    [x]  Unknown A1c     Subjective   NA     Objective   Physical exam  General Obese male in no acute distress  Neuro  Alert & oriented  Vital Signs   Vitals:    06/12/22 1400   BP: (!) 127/57   Pulse: 88   Resp: 25   Temp:    SpO2: 93%     Extremities    Diabetic foot exam:    Left Foot     Visual Exam: Cool & dry. Thickened toenails    Pulse DP: +1   Right Foot   Visual Exam: Cool & dry. Thickened toenails    Pulse DP: +1      Laboratory  Recent Labs     06/10/22  0358 06/11/22  0513 06/12/22  0405   WBC 34.9* 29.5* 30.3*   HGB 9.5* 8.8* 9.1*   HCT 27.9* 25.6* 26.6*   MCV 87.5 86.5 86.6   PLT 166 173 215       Recent Labs     06/10/22  0358 06/11/22  0513 06/12/22  0405   NA 133* 133* 131*   K 5.3* 5.2* 6.4*   CL 104 103 102   CO2 20* 21 20*   PHOS 6.1* 6.4* 9.0*   BUN 83* 63* 93*   CREATININE 5.24* 4.53* 5.76*       Lab Results   Component Value Date    ALT 162 (H) 06/09/2022    AST 142 (H) 06/09/2022    ALKPHOS 215 (H) 06/09/2022    BILITOT 9.0 (H) 06/09/2022     No results found for: TSH, TSHREFLEX, TSHFT4, TSHELE, TSH3GEN, TSHHS   Lab Results    Component Value Date    LABA1C 7.7 (H) 06/02/2022     Factors impacting BG management  Factor Dose Comments   Nutrition:  TF   40cc/hr   (209gms CHO)   Running @30cc /hr now   Pain Fent PRN    Infection Rocephin Q24 hrs  Flagyl Q8 hrs WBC  elevated   Other:   Kidney function  Liver function   AKI  Liver enzymes elevated      Blood glucose pattern      Significant diabetes-related events over the past 24-72 hours  05/31/22 Admission BG 233  06/01/22 Dex 4mg  => BG 100s => HC started  06/02/22 Bgs into 200s  06/03/22 Bgs in 170-180s  06/04/22 Bgs rising abit. HC reduced frequency today. Trickle feeds to begin  06/05/22 Bgs rising into 200s  06/08/22 Bgs in target  06/09/22 Bgs rising with start of TF at higher rate  06/10/22 Bgs rose abit with TF @30cc /hr  06/11/22 Bgs 108, 113. No insulin given  06/12/22 No insulin given. No nutrition. AKI    Assessment and Nursing Intervention   Nursing Diagnosis Risk for unstable blood glucose pattern   Nursing Intervention Domain 5250 Decision-making Support   Nursing Interventions Examined current inpatient diabetes/blood glucose control   Explored factors facilitating and impeding inpatient management  Explored corrective strategies with patient and responsible inpatient provider   Informed patient of rational for insulin strategy while hospitalized     Billing Code(s)   []  99233 IP subsequent hospital care - 50 minutes   [x]  99232 IP subsequent hospital care - 35 minutes   []  99231 IP subsequent hospital care - 25 minutes   []  99221 IP initial hospital care - 40 minutes     Before making these care recommendations, I personally reviewed the hospitalization record, including notes, laboratory & diagnostic data and current medications, and examined the patient at the bedside (circumstances permitting) before determining care. More than fifty (50) percent of the time was spent in patient counseling and/or care coordination.  Total minutes: 35    08/11/22, APRN - CNS  Clinical Nurse  Specialist - Diabetes & endocrine disorders  [facility]  Access via [employer]

## 2022-06-12 NOTE — Progress Notes (Signed)
Physical Therapy    Pt currently  receiving dialysis. Will defer and continue to follow.    Gershon Mussel, PT, DPT

## 2022-06-12 NOTE — Progress Notes (Addendum)
Pt received from CCU.  Skin assessment completed and TF started around 2130 per order.    Logun Colavito Scotty Court, RN

## 2022-06-12 NOTE — Progress Notes (Signed)
Speech pathology brief note; full note to follow. FEES completed. Patient with significant mucous prior to study, and all consistencies mixed with mucous and were aspirated during the study. Recommend NPO except ice chips, and non-oral route for nutrition, hydration, and meds (note plan for dobhoff placement pending FEES results). Hopeful that swallow function will improve with use by given patient ice chips and additional time post extubation. SLP following closely. Thank you.    Alonna Buckler, Andres Shad., CCC-SLP

## 2022-06-12 NOTE — Progress Notes (Signed)
[facility] PROGRESS NOTE.      Name: Jaime [name]   DOB: May 07, 1946   MRN: [id number]   Date: 06/12/2022      Chief Complaint   Patient presents with    Hypotension     Hx hypertension - BP is 70s/40s    Altered Mental Status    Nausea    Emesis       Reason for ICU:     Septic shock with multi organ failure  Acute abdomen, s/p emergent exploratory laparotomy 06/01/22     Subjective/Hospital Course:     76 y/o male ([facility] [place]) with PMHx significant of HTN, HLD and T2DM. Came in ED 7/30 noon for N/V/Abd pain and generalized weakess. Found to be encephalopathic, shock state (BP 80s and lactate 10), AKI (Cr 4.06). Received 2L LR, started on Levophed, Vancomycin/Zosyn. Surgery consulted and patient underwent emergent exploratory laparotomy last night that did not reveal perforation but fluid collection in right upper quadrant that was sent for cultures and sensitivity.   7/31/: Patient transferred to ICU post op early AM. Increasing pressor needs, shock liver, thrombocytopenia, coagulopathic, anuric AKI with developing hyperkalemia and started on [facility]. Stress dose steroids and thiamine added for refractory shock. Abx changed to Vanc and merrem.   06/02/22: VT episodes with pulse, shocked x 3 and amio boluses 150 mg each x 3 fallowed by amio drip. Cardio consulted.   8/3: Intubated and sedated. ON of levophed and vasopressin, on 50% Fio2 and PEEP of 7. CRRT ongoing with no factor. RASS neg 5. On 0.5mg /hr of versed gtt. Which is I turned off during rounds.   8/4: Remains intubated and sedated. On fentanyl gtt. Still RASS is neg 5. Down to of levophed. On CRRT with factor of 20ml/hr started today. Found to have liver abscesses on CT abd, plan to place drain today by IR.  8/5: Patient remains intubated and sedated, Overnight had episodes of NSVT. Currently on of levophed gtt.   8/6: Patient remains intubated, on low dose fentanyl. Intermittently following commands. More awake than  yesterday. On CRRT with factor fo 57ml/hr.  8/7: Patient is able to follow commands today. Off pressors. Started on SBT.   8/8: Patient was extubated on 8/7, reports feeling better. On 2 lit oxygen. Still on amiodarone gtt.   08/09 - Failed speech eval. Transfer to [place] close to patient home was attempted but hospital refused to take the patient due to over all complicated current hospital course and guarded status of patient for transport.   8/10 - overall stable, watched for a day before transfer out    No events overnight.   Afebrile. Stable leukocyte count.   On exam, alert awake and oriented.   Labs reviewed, hyperkalemia. Due for dialysis today.   Repeat swallow eval pending.     Principle ICU Diagnosis     # Septic shock, 2/2 intra abdominal source.  Resolved.   # Acute abdomen from abdominal viscus perforation   S/p emergent exploratory laparotomy 06/01/22    Stress dose steroids added 06/01/22 - weaning.   Fludrocortisone added 06/02/22, stopped on 8/4   Vanc stopped 06/02/22.    BC growing enterobacter and e coli 7/31   Currently on rocephin and flagyl,ID following     #Liver abscess involving most of left lobe. S/p drain placement on 8/4. Growing E coli   Repeat CT on 8/3 showed Liver abscesses s/p drain placement    CT  on 8/7 showed decreased size of abscess post drain placement.    # Elevated troponin, likely demand ischemia with non specific EKG changes  # VT with further hemodynamic compromise 06/02/22, controlled now with PO amio   S/p electric CV x 2 followed by amio bolus 150 mg x2 on 06/01/22 AM   On amio drip, transitioned to PO.     # Acute respiratory failure with hypoxemia and hypercapnia, ventilatory dependant   Intubated 06/01/22 and extubated on 8/7.   Uneventful post extubation course     # Acute encephalopathy, likely metabolic and toxic from sepsis. Resolved.   # Propofol infusion toxicity/ intolerance, resolved.    Rising Cpk TG and cardiac arrythmia noted 06/02/22   Propofol changed to  versed 06/02/22 but now both are off.     # Acute Kidney injury, anuric. Likely ATN, dialysis dependant now    CVVHDF initiated 06/01/22 through right Fem NTC   Dialysis dependant now   Tolerated 2L UF today 06/12/22    # Failed swallow eval   NG in place with tube feeds ongoing    Pending swallow eval    # LE gangrene/ discoloration   Podiatry consulted 06/11/22     Plan  - Abx per ID. FU cultures.   - continue PO amio  - remove NG, have swallow eval without NG in place. If passes, start PO diet as recommended per speech therapy and if fails, please insert Dubhoff to continue tube feed.   - Continue HD per nephrology. Tolerated 2L UF today  - Follow podiatry for LE discoloration     OK to transfer patient to floor.     Review of systems:     Review of Systems  No new symptoms   Objective:     Vital Signs:  BP (!) 114/46   Pulse 79   Temp 97.4 F (36.3 C) (Axillary)   Resp 24   Ht 1.727 m (5\' 8" )   Wt 107.7 kg (237 lb 7 oz)   SpO2 97%   BMI 36.10 kg/m      Temp (24hrs), Avg:98.1 F (36.7 C), Min:97.4 F (36.3 C), Max:98.7 F (37.1 C)           Intake/Output:     Intake/Output Summary (Last 24 hours) at 06/12/2022 0844  Last data filed at 06/12/2022 0630  Gross per 24 hour   Intake 520 ml   Output 390 ml   Net 130 ml         Physical Exam  Constitutional:       Appearance: He is ill-appearing.   HENT:      Head: Normocephalic and atraumatic.   Eyes:      Pupils: Pupils are equal, round, and reactive to light.   Cardiovascular:      Rate and Rhythm: Normal rate and regular rhythm.      Pulses: Normal pulses.      Heart sounds: Normal heart sounds.   Abdominal:      General: There is distension.      Palpations: Abdomen is soft.   Skin:     Capillary Refill: Capillary refill takes more than 3 seconds.      Findings: Erythema: cyanosis of toes but warm.   Neurological:      Mental Status: He is alert and oriented to person, place, and time.       LABS AND  DATA:   Reviewed     I personally spent 35 minutes  of  critical care time.  This is time spent at this critically ill patient's bedside actively involved in patient care as well as the coordination of care and discussions with the patient's family.  This does not include any procedural time which has been billed separately.       CRITICAL CARE CONSULTANT NOTE  I had a face to face encounter with the patient, reviewed and interpreted patient data including clinical events, labs, images, vital signs, I/O's, and examined patient.  I have discussed the case and the plan and management of the patient's care with the consulting services, the bedside nurses and the respiratory therapist.      NOTE OF PERSONAL INVOLVEMENT IN CARE   This patient has a high probability of imminent, clinically significant deterioration, which requires the highest level of preparedness to intervene urgently. I participated in the decision-making and personally managed or directed the management of the following life and organ supporting interventions that required my frequent assessment to treat or prevent imminent deterioration.    Yevonne Pax, MD   [facility]  [phone]  06/10/2022

## 2022-06-12 NOTE — Plan of Care (Signed)
Speech Language Pathology  Flexible Endoscopic Evaluation of Swallowing-FEES  Patient: Jaime [name] (76 y.o. male)  Date: 06/12/2022  Primary Diagnosis: Septicemia (HCC) [A41.9]  Gastric perforation (HCC) [K25.5]  Perforated abdominal viscus [R19.8]  Procedure(s) (LRB):  LAPAROTOMY EXPLORATORY (N/A)  LAPAROSCOPY DIAGNOSTIC (N/A)  ENDOSCOPY OF ILEAL CONDUIT (N/A) 11 Days Post-Op   Precautions: aspiration,  NPO                  ASSESSMENT :  Based on the objective data described below, the patient presents with severe pharyngeal dysphagia. Patient with severe mucous throughout pharynx and in laryngeal vestibule that limited visualization. All consistencies (ice chips, thin liquid, and puree) mixed with mucous and were aspirated after the swallow. Patient coughed in response, however material was then re-aspirated during inhale. Hopeful that swallow function will improve with additional time post extubation, improved secretion management, and continued ice chip trials for swallow rehab.    Patient will benefit from skilled intervention to address the above impairments.     PLAN :  Recommendations and Planned Interventions:  Diet: NPO and ice chips  Non-oral route for nutrition, hydration, and meds (note patient pending dobhoff placement)  SLP to follow for dysphagia management. Hopeful for improved swallow function with additional time post extubation and improved secretion management  Acute SLP Services: Yes, patient will be followed by speech-language pathology 3x/week to address goals. Patient's rehabilitation potential is considered to be Fair.  Discharge Recommendations: Continue to assess pending progress     SUBJECTIVE:   Patient stated "That was nasty."    OBJECTIVE:   History reviewed. No pertinent past medical history.  Past Surgical History:   Procedure Laterality Date    BLADDER SURGERY N/A 06/01/2022    ENDOSCOPY OF ILEAL CONDUIT performed by Guinevere Ferrari, MD at [facility] MAIN OR    CT VISCERAL PERCUTANEOUS  DRAIN  06/05/2022    CT VISCERAL PERCUTANEOUS DRAIN 06/05/2022 [facility] RAD CT    LAPAROSCOPY N/A 06/01/2022    LAPAROSCOPY DIAGNOSTIC performed by Guinevere Ferrari, MD at [facility]    LAPAROTOMY N/A 06/01/2022    LAPAROTOMY EXPLORATORY performed by Guinevere Ferrari, MD at [facility]     Prior Level of Function/Home Situation:   Social/Functional History  Lives With: Spouse  Type of Home: House  Home Layout: One level  Home Equipment: None  ADL Assistance: Independent  Ambulation Assistance: Independent  Transfer Assistance: Independent  Active Driver: Yes  Diet prior to admission:   Current Diet:  NPO      Cognitive and Communication Status:  Neurologic State: Alert  Orientation Level: Oriented x4  Cognition: Follows commands    History/indication for procedure: recent prolonged intubation, hoarse vocal quality, weak cough with PO  Lidocaine used: No  Nostril used: right  Scope Used: Ambu disposable scope  Feeding Tube Present in Nare: No  Adverse Reaction: No  Respiratory status: On nasal cannula        Part I:  Anatomy:       General Comments: significant mucous made assessment of anatomy difficult and incomplete     Palate:   WFL   Base of tongue:   WFL   Valleculae:   WFL   Epiglottis:   WFL, omega-shaped   Arytenoids:   Unable to assess due to mucous   False vocal folds:   WFL   Vocal folds:   WFL Pyriform sinus:   Unable to assess due to mucous  Part II:  Laryngeal Function:  Unable to assess due to significant mucous in pharynx        Part III:  Secretions:    [place] Secretion Rating (0-7): 6     Location:  2 - Secretions in laryngeal vestibule (beyond healthy lubrication of mucosa) Amount:   2 - Secretions filling (80-100%) or over spilling pyriform fossae / inter-arytenoid space Response*:  2 - Ineffective attempts to clear secretions from laryngeal vestibule   Comments (e.g., quality/texture/color): Thick, brown secretions     *Normal airway responses in the pharynx or laryngeal vestibule may include  spontaneous coughing, throat clearing, and/or swallowing        Part IV:  Swallow Trials:    Subjective comments regarding oral phase of swallow: prolonged    Comments regarding esophageal phase of swallow: n/a    Consistency: Ice chips and Thin liquid  Position of Bolus Pre-Swallow: Pyriform sinuses  [place] Pharyngeal Residue Severity Rating Scale: Valleculae residue: 5 - severe; >50%, filled to epiglottic rim and Pyriform sinus residue: 5 - severe; >50%, filled to aryepiglottic fold (bolus mixed with mucous with poor clearance)  Spontaneously Cleared: No, reduced with liquid wash however thin liquid was then aspirated  Penetration: Yes, at least after the swallow due to residue, possibly during the swallow as well  Aspiration: Yes, at least after the swallow due to residue, possibly during the swallow as well  Response: Cough ineffective  [place] Penetration-Aspiration Scale: 7 - Material enters the airway, passes below the vocal folds, and is not ejected from the trachea, despite effort    Consistency: Puree  Position of Bolus Pre-Swallow: Valleculae  [place] Pharyngeal Residue Severity Rating Scale: Valleculae residue: 4 - moderate; 25-50%, epiglottic ligament covered and Pyriform sinus residue: 4 - moderate; 25-50%, up wall to half full (bolus mixed with mucous with poor clearance)  Spontaneously Cleared: No, reduced to mild with ice chip trials, however then ice chips mixed with mucous were aspirated  Penetration: Yes, after the swallow when mixed with mucous  Aspiration: Yes, after the swallow when mixed with mucous  Response: Cough ineffective  Rosenbek Penetration-Aspiration Scale: 7 - Material enters the airway, passes below the vocal folds, and is not ejected from the trachea, despite effort    Dysphagia Severity Rating:   Oral phase: Mild-Moderate  Pharyngeal phase: Severe      NOMS:   The NOMS functional outcome measure was used to quantify this patient's level of swallowing impairment.  Based on the NOMS,  the patient was determined to be at level 2 for swallow function       NOMS Swallowing Levels:  Level 1 (CN): NPO  Level 2 (CM): NPO but takes consistency in therapy  Level 3 (CL): Takes less than 50% of nutrition p.o. and continues with nonoral feedings; and/or safe with mod cues; and/or max diet restriction  Level 4 (CK): Safe swallow but needs mod cues; and/or mod diet restriction; and/or still requires some nonoral feeding/supplements  Level 5 (CJ): Safe swallow with min diet restriction; and/or needs min cues  Level 6 (CI): Independent with p.o.; rare cues; usually self cues; may need to avoid some foods or needs extra time  Level 7 (CH): Independent for all p.o.  ASHA. (2003). National Outcomes Measurement System (NOMS): Adult Speech-Language Pathology User's Guide.       COMMUNICATION/EDUCATION:   Patient was educated regarding his deficit(s) of dysphagia as this relates to his diagnosis.  He demonstrated Good understanding as evidenced by Verbalizing  understanding.    The patient's plan of care including recommendations, planned interventions, and recommended diet changes were discussed with: Registered nurse    Patient/family have participated as able in goal setting and plan of care and Patient/family agree to work toward stated goals and plan of care    Thank you,  Nelson Chimes, SLP  Minutes: 30    Problem: SLP Adult - Impaired Swallowing  Goal: By Discharge: Advance to least restrictive diet without signs or symptoms of aspiration for planned discharge setting.  See evaluation for individualized goals.  Description: Speech Pathology Goals  Initiated 06/10/2022    1. Patient will participate in re-evaluation of swallow function within 7 days.  06/12/2022 1416 by Nelson Chimes, SLP  Outcome: Progressing  06/12/2022 1251 by Nelson Chimes, SLP  Outcome: Progressing

## 2022-06-12 NOTE — Progress Notes (Signed)
Admit Date: 05/31/2022    POD 11 Days Post-Op    Procedure:  Procedure(s):  LAPAROTOMY EXPLORATORY  LAPAROSCOPY DIAGNOSTIC  ENDOSCOPY OF ILEAL CONDUIT    Subjective:     No new complaints.  Tube feeds remain held after high residuals.  Leukocytosis stable at 30.  Remains with NG tube instead of Dobbhoff.    Objective:     Blood pressure (!) 127/57, pulse 88, temperature 98.4 F (36.9 C), resp. rate 25, height 1.727 m (5\' 8" ), weight 107.7 kg (237 lb 7 oz), SpO2 93 %.    Temp (24hrs), Avg:98.1 F (36.7 C), Min:97.4 F (36.3 C), Max:98.7 F (37.1 C)      Physical Exam:  GENERAL: appears stated age, LUNG: Sounds wet, junky HEART: regular rate and rhythm, ABDOMEN: soft, distended, wound c/d/I, JP drainage serous appearing today, pigtail abscess drain bilious, EXTREMITIES:  ischemia of first 2 toes bilaterally.     Labs:   Recent Results (from the past 24 hour(s))   POCT Glucose    Collection Time: 06/11/22  5:05 PM   Result Value Ref Range    POC Glucose 96 65 - 117 mg/dL    Performed by: 08/11/22 RN    EKG 12 Lead    Collection Time: 06/11/22 11:06 PM   Result Value Ref Range    Ventricular Rate 88 BPM    Atrial Rate 88 BPM    P-R Interval 170 ms    QRS Duration 160 ms    Q-T Interval 450 ms    QTc Calculation (Bazett) 544 ms    P Axis 41 degrees    R Axis -8 degrees    T Axis 111 degrees    Diagnosis       Normal sinus rhythm  Left bundle branch block  Abnormal ECG  When compared with ECG of 07-Jun-2022 14:11,  Left bundle branch block is now present  Confirmed by 09-Jun-2022 MD (Frankey Poot) on 06/12/2022 10:34:51 AM     POCT Glucose    Collection Time: 06/12/22 12:33 AM   Result Value Ref Range    POC Glucose 106 65 - 117 mg/dL    Performed by: 08/12/22 RN    CBC with Auto Differential    Collection Time: 06/12/22  4:05 AM   Result Value Ref Range    WBC 30.3 (H) 4.1 - 11.1 K/uL    RBC 3.07 (L) 4.10 - 5.70 M/uL    Hemoglobin 9.1 (L) 12.1 - 17.0 g/dL    Hematocrit 08/12/22 (L) 36.6 - 50.3 %    MCV 86.6  80.0 - 99.0 FL    MCH 29.6 26.0 - 34.0 PG    MCHC 34.2 30.0 - 36.5 g/dL    RDW 91.4 (H) 78.2 - 14.5 %    Platelets 215 150 - 400 K/uL    MPV 12.9 8.9 - 12.9 FL    Nucleated RBCs 0.0 0 PER 100 WBC    nRBC 0.00 0.00 - 0.01 K/uL    Neutrophils % 85 (H) 32 - 75 %    Band Neutrophils 1 %    Lymphocytes % 5 (L) 12 - 49 %    Monocytes % 9 5 - 13 %    Eosinophils % 0 0 - 7 %    Basophils % 0 0 - 1 %    Immature Granulocytes 0 0.0 - 0.5 %    Neutrophils Absolute 26.1 (H) 1.8 - 8.0 K/UL    Lymphocytes Absolute  1.5 0.8 - 3.5 K/UL    Monocytes Absolute 2.7 (H) 0.0 - 1.0 K/UL    Eosinophils Absolute 0.0 0.0 - 0.4 K/UL    Basophils Absolute 0.0 0.0 - 0.1 K/UL    Absolute Immature Granulocyte 0.0 0.00 - 0.04 K/UL    Differential Type MANUAL      RBC Comment ANISOCYTOSIS  1+       Basic Metabolic Panel    Collection Time: 06/12/22  4:05 AM   Result Value Ref Range    Sodium 131 (L) 136 - 145 mmol/L    Potassium 6.4 (H) 3.5 - 5.1 mmol/L    Chloride 102 97 - 108 mmol/L    CO2 20 (L) 21 - 32 mmol/L    Anion Gap 9 5 - 15 mmol/L    Glucose 116 (H) 65 - 100 mg/dL    BUN 93 (H) 6 - 20 MG/DL    Creatinine 8.18 (H) 0.70 - 1.30 MG/DL    Bun/Cre Ratio 16 12 - 20      Est, Glom Filt Rate 10 (L) >60 ml/min/1.64m2    Calcium 7.8 (L) 8.5 - 10.1 MG/DL   Magnesium    Collection Time: 06/12/22  4:05 AM   Result Value Ref Range    Magnesium 3.0 (H) 1.6 - 2.4 mg/dL   Phosphorus    Collection Time: 06/12/22  4:05 AM   Result Value Ref Range    Phosphorus 9.0 (H) 2.6 - 4.7 MG/DL   POCT Glucose    Collection Time: 06/12/22  4:07 AM   Result Value Ref Range    POC Glucose 98 65 - 117 mg/dL    Performed by: Karlene Einstein RN        Data Review images and reports reviewed    Assessment:     Principal Problem:    Gastric perforation (HCC)  Active Problems:    Type 2 diabetes mellitus with hyperglycemia, without long-term current use of insulin (HCC)    Intestinal obstruction (HCC)    Severe sepsis (HCC)    Septic shock (HCC)    Escherichia coli septicemia  (HCC)    Liver abscess    Pneumoperitoneum    Acute respiratory failure with hypoxia (HCC)    AKI (acute kidney injury) (HCC)    Multi-organ failure with heart failure (HCC)    Thrombocytopenia (HCC)    E coli bacteremia    Hepatic abscess    Peripheral arterial disease (HCC)    Hyperbilirubinemia    Gram negative sepsis (HCC)    Septicemia (HCC)  Resolved Problems:    * No resolved hospital problems. *      Plan/Recommendations/Medical Decision Making:     Now on HD  E coli in hepatic drain cxs  Continue supportive care per intensivist service.  High risk of aspiration, consider pulmonary source for leukocytosis  Maintain JP drains until tolerating p.o.    Callie Fielding, MD  [facility]

## 2022-06-12 NOTE — Progress Notes (Signed)
End of Shift Note    Bedside shift change report given to Patrice (Cabin crew) by Grant Fontana, RN (offgoing nurse).  Report included the following information SBAR, Kardex, MAR, Accordion, Recent Results, and Cardiac Rhythm NSR    Shift worked:  0700-1900     Shift summary and any significant changes:     Patient had HD#3 this morning; will likely have again tomorrow    CM is working on [place] unlikely to happen over the weekend--patient will be downgraded from ICU status.  Pharmacy to clarify if patient needs long-term IV antibiotics--if so, he will need a PICC line.     At 1209 NGT was taken out so that his swallowing could be evaluated.    1330- SLP at bedside--FEES test.  Patient remains on ice chips only--we will plan to place dobhoff later today.     1407- PT/OT at bedside.    1630- dobhoff placed.  TF order has been updated by dietitian.    1739- Dobhoff placement confirmed by x-ray         Concerns for physician to address:       Zone phone for oncoming shift:          Activity:     Number times ambulated in hallways past shift: 0  Number of times OOB to chair past shift: 0    Cardiac:   Cardiac Monitoring: Yes           Access:  Current line(s): PIV and HD access     Genitourinary:   Urinary status: anuric    Respiratory:      Chronic home O2 use?: NO  Incentive spirometer at bedside: YES       GI:     Current diet:  ADULT TUBE FEEDING; Nasogastric; Renal Formula; Continuous; 10; Yes; 10; Q 24 hours; 40; 50; Q 4 hours; Protein; Prosource BID  Passing flatus: YES  Tolerating current diet: YES       Pain Management:   Patient states pain is manageable on current regimen: YES    Skin:     Interventions: float heels, PT/OT consult, and limit briefs    Patient Safety:  Fall Score:    Interventions: bed/chair alarm and pt to call before getting OOB       Length of Stay:  Expected LOS: 5  Actual LOS: 11      Grant Fontana,  RN

## 2022-06-12 NOTE — Progress Notes (Signed)
Hospitalist Progress Note    NAME:   Jaime [name]   DOB: 10-Dec-1945   MRN: [id number]     Date/Time: 06/12/2022 7:22 PM  Patient PCP: No primary care provider on file.    Estimated discharge date: >48 hrs  Barriers: Will need SLP/diet advancement, eventual placement    76 y/o [place] ([place] [facility] [place] with PMHx significant of HTN, HLD and T2DM. Came in ED 7/30 noon for N/V/Abd pain and generalized weakess. Found to be encephalopathic, shock state (BP 80s and lactate 10), AKI (Cr 4.06). Received 2L LR, started on Levophed, Vancomycin/Zosyn. Surgery consulted and patient underwent emergent exploratory laparotomy last night that did not reveal perforation but fluid collection in right upper quadrant that was sent for cultures and sensitivity.     Assessment / Plan:    Septic shock, 2/2 intra abdominal source.  Resolved.   Acute abdomen from abdominal viscus perforation              S/p emergent exploratory laparotomy 06/01/22               Stress dose steroids added 06/01/22 - weaning.              Fludrocortisone added 06/02/22, stopped on 8/4              Vanc stopped 06/02/22.               BC growing enterobacter and e coli 7/31              Currently on rocephin and flagyl,ID following      Liver abscess involving most of left lobe  S/p drain placement on 8/4. Growing E coli              Repeat CT on 8/3 showed Liver abscesses s/p drain placement               CT on 8/7 showed decreased size of abscess post drain placement.     Elevated troponin, likely demand ischemia with non specific EKG changes  VT with further hemodynamic compromise 06/02/22  Controlled now with PO amio              S/p electric CV x 2 followed by amio bolus 150 mg x2 on 06/01/22 AM              On amio drip, transitioned to PO.                Acute respiratory failure with hypoxemia and hypercapnia, ventilatory dependant              Intubated 06/01/22 and extubated on 8/7.              Uneventful post extubation course      Acute  encephalopathy, likely metabolic and toxic from sepsis. Resolved.   Propofol infusion toxicity/ intolerance, resolved.               Rising Cpk TG and cardiac arrythmia noted 06/02/22              Propofol changed to versed 06/02/22 but now both are off.      Acute Kidney injury, anuric. Likely ATN, dialysis dependant now               CVVHDF initiated 06/01/22 through right Fem NTC              Dialysis dependant now  Tolerated 2L UF today 06/12/22     Failed swallow eval              NG in place with tube feeds ongoing               FEES failed, now with dobhoff     LE gangrene/ discoloration              Podiatry consulted 06/11/22     Medical Decision Making:   I personally reviewed labs: CBC, BMP  I personally reviewed imaging:  I personally reviewed EKG:  Toxic drug monitoring:   Discussed case with:  ICU        Code Status: FULL  DVT Prophylaxis: Heparin  GI Prophylaxis: Not indicated    Subjective:     Chief Complaint / Reason for Physician Visit  Afebrile. " I am still here"  Frequent coughing during interview.  For now will need to continue guidance by speech therapy.  Discussed with RN events overnight.       Objective:     VITALS:   Last 24hrs VS reviewed since prior progress note. Most recent are:  Patient Vitals for the past 24 hrs:   BP Temp Temp src Pulse Resp SpO2   06/12/22 1400 (!) 127/57 -- -- 88 25 93 %   06/12/22 1300 123/60 -- -- 89 25 97 %   06/12/22 1200 (!) 127/58 -- -- 87 22 99 %   06/12/22 1155 (!) 127/58 98.4 F (36.9 C) -- 86 23 98 %   06/12/22 1145 (!) 110/57 -- -- 89 -- --   06/12/22 1130 (!) 102/59 -- -- 86 -- --   06/12/22 1115 (!) 97/57 -- -- 89 -- --   06/12/22 1100 (!) 102/59 -- -- 90 -- --   06/12/22 1045 (!) 94/53 -- -- 90 -- --   06/12/22 1030 (!) 94/54 -- -- 86 -- --   06/12/22 1015 (!) 98/57 -- -- 85 -- --   06/12/22 1000 (!) 98/54 -- -- 83 -- --   06/12/22 0945 103/60 -- -- 85 -- --   06/12/22 0930 107/68 -- -- 82 -- --   06/12/22 0915 (!) 95/58 -- -- 83 -- --    06/12/22 0909 (!) 89/57 -- -- -- -- --   06/12/22 0900 (!) 88/52 -- -- 84 -- --   06/12/22 0845 96/61 -- -- 82 -- --   06/12/22 0830 132/61 -- -- 78 -- --   06/12/22 0825 (!) 130/58 98 F (36.7 C) Axillary 77 23 98 %   06/12/22 0430 (!) 114/46 -- -- 79 24 97 %   06/12/22 0400 (!) 117/57 97.4 F (36.3 C) Axillary 80 26 97 %   06/12/22 0330 119/66 -- -- 81 20 97 %   06/12/22 0000 (!) 110/59 97.5 F (36.4 C) Axillary 90 27 92 %   06/11/22 2300 (!) 131/57 -- -- 88 25 91 %   06/11/22 2200 (!) 100/52 -- -- 86 26 92 %   06/11/22 2100 (!) 107/49 -- -- 87 21 93 %   06/11/22 2030 (!) 119/52 -- -- 88 20 93 %   06/11/22 2000 131/66 98.4 F (36.9 C) Axillary 87 26 93 %   06/11/22 1930 128/63 -- -- 87 23 92 %         Intake/Output Summary (Last 24 hours) at 06/12/2022 1922  Last data filed at 06/12/2022 1155  Gross per 24 hour   Intake 700  ml   Output 2815 ml   Net -2115 ml        I had a face to face encounter and independently examined this patient on 06/12/2022, as outlined below:  PHYSICAL EXAM:  General: Alert, cooperative  EENT:  EOMI. Anicteric sclerae.  Resp:  CTA bilaterally, no wheezing or rales.  No accessory muscle use  CV:  Regular  rhythm,  No edema  GI:  Soft, Non distended, Non tender.  +Bowel sounds  Neurologic:  Alert and oriented X 3, normal speech,   Psych:   Good insight. Not anxious nor agitated  Skin:  No rashes.  No jaundice    Reviewed most current lab test results and cultures  YES  Reviewed most current radiology test results   YES  Review and summation of old records today    NO  Reviewed patient's current orders and MAR    YES  PMH/SH reviewed - no change compared to H&P  ________________________________________________________________________  Care Plan discussed with:    Comments   Patient x    Family      RN x    Care Manager     Consultant                        Multidiciplinary team rounds were held today with case manager, nursing, pharmacist and Higher education careers adviser.  Patient's plan of care  was discussed; medications were reviewed and discharge planning was addressed.     ________________________________________________________________________  Total NON critical care TIME:    Minutes    Total CRITICAL CARE TIME Spent:   Minutes non procedure based      Comments   >50% of visit spent in counseling and coordination of care     ________________________________________________________________________  Monte Fantasia, MD     Procedures: see electronic medical records for all procedures/Xrays and details which were not copied into this note but were reviewed prior to creation of Plan.      LABS:  I reviewed today's most current labs and imaging studies.  Pertinent labs include:  Recent Labs     06/10/22  0358 06/11/22  0513 06/12/22  0405   WBC 34.9* 29.5* 30.3*   HGB 9.5* 8.8* 9.1*   HCT 27.9* 25.6* 26.6*   PLT 166 173 215     Recent Labs     06/10/22  0358 06/11/22  0513 06/12/22  0405   NA 133* 133* 131*   K 5.3* 5.2* 6.4*   CL 104 103 102   CO2 20* 21 20*   GLUCOSE 228* 134* 116*   BUN 83* 63* 93*   CREATININE 5.24* 4.53* 5.76*   CALCIUM 7.6* 7.7* 7.8*   MG 2.8* 2.8* 3.0*   PHOS 6.1* 6.4* 9.0*       Signed: Monte Fantasia, MD

## 2022-06-12 NOTE — Progress Notes (Signed)
Occupational Therapy    Chart reviewed and interventions attempted. Pt just started HD. Will follow up later today, as able.

## 2022-06-12 NOTE — Progress Notes (Signed)
Palliative Medicine  Jaime [name] is a 76 y.o. with a past history of HTN, HLD, and DM2, who was admitted on 05/31/2022 while traveling in [place] for Nascar race, with a diagnosis of Perforated Viscus seen on CT, Septic Shock, and AKI.     Code Status: Full Code     Advance Care Planning:  Demographics 06/08/2022   Marital Status Married    No AMD on file. Spouse is legal nok.     Patient / Family Encounter Documentation     Participants (names): spouse Jaime [name], "play daughter" Jaime [name], Jaime [name], Jaime [name]     Narrative: Palliative SW checked in on patient after consulting his nurse, Jaime [name], who was preparing to insert nasal feeding tube. Patient was lethargic, but alert and receptive to encounter. He was lying on his back and reported he had worked with PT/OT today and was tired. He was interested in calling his wife, so this Clinical research associate facilitated a call through Jaime [name] on speaker phone.  Patient and spouse expressed appreciation for the call and are hopeful he will be back in [place] by their wedding anniversary 8/20.  This writer left a vm for CM inquiring about plans for his transfer.     Psychosocial Issues Identified/ Resilience Factors:   Patient is a veteran of the [name]. He is retired now and has VA benefits, but a high co-pay for his medication due to his income.  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 His wife uses O2 and has a Designer, jewellery with only a Art therapist for this trip. She ambulates in a wheelchair. She is buying groceries for Jaime [name] and paying bills while patient is hospitalized.    Patient and spouse will be married 35 years on August 20. His best friend is Jaime [name]. They have also known each other for many years and Jaime [name] was with patient on his trip to [place] and urged him to go to the hospital, for which patient is grateful.     Caregiver Burden: High  Does the caregiver feel confident administering medication? Not discussed.  Does the  caregiver need any help connecting with community resources? Yes  Does the caregiver feel confident assisting with activities of daily living? No.     Goals of Care / Plan:   Patient's symptoms will be managed.  Patient wants to transfer as soon as medically stable (by 8/20) to a hospital in [place], [place].  Spouse now has his wedding ring, which was tight on his finger and removed by ICU nurse.  Patient will be offered the opportunity to complete an AMD if he interested and able during this hospitalization.  Palliative team will continue to provide education and support as appropriate.     Thank you for including Palliative Medicine in the care of [name].     Jaime Leatherwood, LCSW  [phone])

## 2022-06-12 NOTE — Progress Notes (Addendum)
TRANSFER - OUT REPORT:    Verbal report given to Elease Hashimoto on Jaime [name]  being transferred to [place] for routine progression of patient care       Report consisted of patient's Situation, Background, Assessment and   Recommendations(SBAR).     Information from the following report(s) Nurse Handoff Report, MAR, Recent Results, and Cardiac Rhythm nsr  was reviewed with the receiving nurse.           Lines:   Peripheral IV 06/01/22 Left Antecubital (Active)   Site Assessment Clean, dry & intact 06/12/22 1600   Line Status Flushed;Infusing 06/12/22 1600   Line Care Connections checked and tightened;Line pulled back 06/12/22 1600   Phlebitis Assessment No symptoms 06/12/22 1600   Infiltration Assessment 0 06/12/22 1600   Alcohol Cap Used Yes 06/12/22 1600   Dressing Status Clean, dry & intact 06/12/22 1600   Dressing Type Transparent 06/12/22 0200   Dressing Intervention Dressing changed 06/05/22 0400       Peripheral IV 06/09/22 Distal;Right;Anterior Antecubital (Active)   Site Assessment Clean, dry & intact 06/12/22 1600   Line Status Flushed;Capped 06/12/22 1600   Line Care Connections checked and tightened 06/12/22 1600   Phlebitis Assessment No symptoms 06/12/22 1600   Infiltration Assessment 0 06/12/22 1600   Alcohol Cap Used Yes 06/12/22 1600   Dressing Status Clean, dry & intact 06/12/22 1600   Dressing Type Transparent 06/12/22 1600       Hemodialysis Central Access Right Neck (Active)   Continued need for line? Yes 06/12/22 1600   Site Assessment Clean, dry & intact 06/12/22 1600   CVC Lumen Status Blood return noted;Cap changed;Flushed;Heparin locked 06/12/22 1219   Venous Lumen Status Capped 06/12/22 1600   Arterial Lumen Status Capped 06/12/22 1600   Alcohol Cap Used No 06/12/22 1219   Line Care Connections checked and tightened 06/12/22 1600   Dressing Type Sterile dressing, transparent;Bacteriocidal 06/12/22 1600   Date of Last Dressing Change 06/09/22 06/12/22 1600   Dressing Status Clean, dry &  intact 06/12/22 1600   Dressing Intervention Dressing changed 06/12/22 1219   Verification by x-ray Yes 06/09/22 2000        Opportunity for questions and clarification was provided.      Patient transported with:  Monitor and O2 @ 2lpm

## 2022-06-12 NOTE — Plan of Care (Addendum)
Problem: Occupational Therapy - Adult  Goal: By Discharge: Performs self-care activities at highest level of function for planned discharge setting.  See evaluation for individualized goals.  Description: FUNCTIONAL STATUS PRIOR TO ADMISSION:     , ADL Assistance: Independent,  ,  ,  ,  ,  ,  , Ambulation Assistance: Independent, Transfer Assistance: Independent, Active Driver: Yes     HOME SUPPORT: Patient lived with spouse and was independent.    Occupational Therapy Goals:  Initiated 06/10/2022  1.  Patient will perform grooming at bed level with Moderate Assist within 7 day(s).  2.  Patient will perform self-feeding with Moderate Assist within 7 day(s).  3.  Patient will perform upper body dressing with Maximal Assist within 7 day(s).  4.  Patient will complete supine>sit with mod assist x2  within 7 day(s).  5.  Patient will tolerate sitting EOB with fair sitting balance in preparation for ADLs within 7 day(s).      Outcome: Progressing     OCCUPATIONAL THERAPY TREATMENT  Patient: Jaime [name] (76 y.o. male)  Date: 06/12/2022  Primary Diagnosis: Septicemia (HCC) [A41.9]  Gastric perforation (HCC) [K25.5]  Perforated abdominal viscus [R19.8]  Procedure(s) (LRB):  LAPAROTOMY EXPLORATORY (N/A)  LAPAROSCOPY DIAGNOSTIC (N/A)  ENDOSCOPY OF ILEAL CONDUIT (N/A) 11 Days Post-Op   Precautions: NPO                Chart, occupational therapy assessment, plan of care, and goals were reviewed.    ASSESSMENT  Patient continues to benefit from skilled OT services and is progressing towards goals. Pt continues to demonstrate improved sitting balance and trunk stability, tolerating sitting EOB ~10 minutes, with primarily CGA. Improved distal BUE strength and AROM noted, but deltoids remain weak and unable to flex shoulders against gravity. Dizziness reported towards end of sitting with BP 137/57 supine and 117/98 in sitting. Pt remains motivated to participate in therapy and is appropriate for IPR.             PLAN :  Patient  continues to benefit from skilled intervention to address the above impairments.  Continue treatment per established plan of care to address goals.    Recommend with staff: encourage BUE AROM    Recommend next OT session: continue POC    Recommendation for discharge: (in order for the patient to meet his/her long term goals): Therapy 3 hours/day 5-7 days/week    Other factors to consider for discharge: patient's current support system is unable to meet their requirements for physical assistance, impaired cognition, high risk for falls, not safe to be alone, and concern for safely navigating or managing the home environment    IF patient discharges home will need the following DME: continuing to assess with progress       SUBJECTIVE:   Patient stated "I've been doing the exercises."    OBJECTIVE DATA SUMMARY:   Cognitive/Behavioral Status:  Orientation  Overall Orientation Status: Impaired  Orientation Level: Oriented to situation;Oriented to person;Disoriented to time;Oriented to place  Cognition  Overall Cognitive Status: WNL    Functional Mobility and Transfers for ADLs:  Bed Mobility:  Bed Mobility Training  Bed Mobility Training: Yes  Interventions: Verbal cues;Tactile cues  Rolling: Maximum assistance;Assist X2  Supine to Sit: Maximum assistance;Assist X2  Sit to Supine: Maximum assistance;Assist X2  Scooting: Maximum assistance;Assist X2     Transfers:   Transfer Training  Transfer Training: No      Balance:     Balance  Sitting:  Impaired  Sitting - Static: Fair (occasional)  Sitting - Dynamic: Fair (occasional)      ADL Intervention:                                                                Pt tolerated sitting EOB ~10 minutes, requiring initial max assist for sitting balance, progressing to primarily CGA. Pt completed trunk flex/ext and lateral trunk flexion/weight bearing onto elbow then back to midline 2x each side.              Exercises in supine    EXERCISE    Sets    Reps    Active  Active Assist     Passive  Self ROM    Comments    Cervical flex/ext/rotation   [x]    []    []    []       Scapular Protraction/Retraction   []    []    []    []       Shoulder Flexion   []                             [x]                              []                              []                                 Shoulder Abduction/Adduction   []                              []                              []                              []                                 Shoulder Internal/External Rotation   []                              [x]                               []                              []                                 Elbow Flexion/Extension   []                              [  x]                             []                              []                                 Wrist Flexion/Extension   []                              []                              []                              []                                 Wrist Pronation/Supination   []                              []                               []                              []                                       Finger Flexion/Extension   [x]          and All exercises performed 5 reps and reinforced the importance of slow controlled movement and mirroring non affected side with affected side for increased gross motor control.              Pain Rating:  0/10   Pain Intervention(s):         Activity Tolerance:   Fair   Please refer to the flowsheet for vital signs taken during this treatment.    After treatment:   Patient left in no apparent distress in bed, Call bell within reach, Bed/ chair alarm activated, and Side rails x3    COMMUNICATION/EDUCATION:   The patient's plan of care was discussed with: physical therapist and registered nurse    Patient Education  Education Given To: Patient  Education Provided: Role of Therapy;Plan of Care;Transfer Training;Home Exercise Program  Education Method: Verbal  Barriers to Learning: None  Education Outcome: Continued  education needed;Verbalized understanding    Thank you for this referral.  , OT  Minutes: 33

## 2022-06-12 NOTE — Progress Notes (Addendum)
Infectious Disease Progress    Impression    Gram negative sepsis  S/p Septic shock  Multiorgan failure    E. coli bacteremia  Blood cultures 7/31+ for E. coli   2/2 LAC (pan sensitive)   Negative repeat cultures 8/4       Acute abdomen  Pneumoperitoneum  S/p diagnostic laparoscopy, laparotomy  EGD 7/31  Findings of cloudy fluid in right upper quadrant, yellow/white  Inflammatory peel over lesser curve of stomach, inferior lobe of liver  No gross perforation identified  Pancreas and retroperitoneum appeared edematous  Intra-Op fluid culture + for E. Coli (pansensitive)    Hepatic abscess  Gas and fluid containing collection 7.1 x 10.8 x 5.4 cm  In anterior left lobe of liver, patchy areas of hypodensity right lobe  4 x 4.5 x 5.5 cm collection  S/p drainage by IR 8/4.Cultures + for  few E.coli  Repeat CT 8/7 shows reduction in size of hepatic abscess    Persistent leukocytosis  WBC 30.3    Acute hypoxic respiratory failure  Intubated 7/31, s/p extubation 8/7  Repeat CXR + for B/Lateral lung infiltrates worse  ?  Aspiration pneumonia    Gangrene, discoloration of toes  For podiatry evaluation    AKI  Cr 5.76  S/p CV VH  Currently on HD    Multiorgan failure  Coagulopathy    Thrombocytopenia  Resolved platelets 215    Transaminitis  Improved    Hyperbilirubinemia  Bilirubin 8.5    Diabetes type 2  Hyperglycemia  A1c 7.7    Obesity  BMI 34.76      Plan  Continue Ceftriaxone, Flagyl IV  Patient will require antimicrobials until complete  resolution of abscess, usual course 6 weeks  Plan to DC Flagyl after  total 2 weeks of anaerobic coverage  Aspiration precautions      Antimicrobial orders for discharge  -Ceftriaxone 2 g  IV daily  end date 9/15  -Flagyl 500 mg p.o. twice daily end date 8/18  -Pull PICC at end of therapy on/ after 9/15 &  Imaging of liver  -Weekly CBC, CMP-  -Fax reports to [phone], call with critical labs  at [phone]  -Encourage adequate fluids, daily probiotic/yogurt  -If PICC malfunction  occurs and home health cannot reposition  please send patient to ED immediately  -ID follow-up -call [phone] for appointment- if pt to stay in   [place]      Please contact ID on call with questions, concerns over the weekend.        Extensive review of chart notes, labs, imaging, cultures done  Additionally review of done:  D/w ICU team  Jaime [name]  is a 76 y/o male PMHx of HTN, HLD and T2DM admitted 7/31 for septic shock in the setting of perforated viscus.  Presented to ED via EMS c/o approximately 2 days of severe abdominal pain, nausea and vomiting. He was in town from [place] for the [employer]  ED work- up revealed  B/P 80/42, HR 112, Afebrile, Leukocytosis 16.6, lactic acidosis 10,  AKI, Cr 4.06. CT A/P demonstrated perforated viscous. Received 2L LR, started on Levophed, Vancomycin/Zosyn. Surgery consulted & to OR for emergent exploratory laparotomy.  Did not reveal perforation but fluid collection in the right upper quadrant.  Cultures were sent and back with E. coli pansensitive.  Blood cultures done on 7/31+ for pansensitive E. coli 2/2 LAC.  Patient transferred to ICU post op early 7/31.  Increasing pressor needs, shock  liver, thrombocytopenia, coagulopathic, anuric AKI with developing hyperkalemia and started on CHHF. Stress dose steroids and thiamine added for refractory shock. Abx changed to Vanc and merrem.   On 8/1 patient developed VT episodes with pulse, shocked x 3 and amio boluses 150 mg each x 3 fallowed by amio drip. Cardio consulted.  On 8/3  He was intubated and sedated and 2 pressors.   Patient remains intubated and sedated today.  He has been found found to have liver abscesses on CT abd, plan to place drain today by IR.  Patient is on lower dose of pressors.  Antimicrobial therapy  to date  Cefepime x 1 dose         7/31  Zosyn  x 1 dose              7/31  Vancomycin x 1 dose     7/31  Metronidazole                 7/31-8/2 and 8/3-8/4  Ceftriaxone                      8/2-8/4  Eraxis                            7/31- 8/4        Pt seen today awake, talking  Afebrile.  Vital stable.    History reviewed. No pertinent past medical history.    Past Surgical History:   Procedure Laterality Date    BLADDER SURGERY N/A 06/01/2022    ENDOSCOPY OF ILEAL CONDUIT performed by Guinevere Ferrari, MD at [facility]    CT VISCERAL PERCUTANEOUS DRAIN  06/05/2022    CT VISCERAL PERCUTANEOUS DRAIN 06/05/2022 [facility] RAD CT    LAPAROSCOPY N/A 06/01/2022    LAPAROSCOPY DIAGNOSTIC performed by Guinevere Ferrari, MD at [facility]    LAPAROTOMY N/A 06/01/2022    LAPAROTOMY EXPLORATORY performed by Guinevere Ferrari, MD at [facility]       Allergies   Allergen Reactions    Augmentin [Amoxicillin-Pot Clavulanate] Hives     Tolerated ceftriaxone 06/2022    Codeine      Unknown reaction       Social Connections: Not on file       No family status information on file.           Review of Systems - Negative except those mentioned in H&P      PHYSICAL EXAM:  General:          Awake, in no distress  EENT:              EOMI. Anicteric sclerae. MMM  Resp:               CTA bilaterally, no wheezing or rales.  No accessory muscle use  CV:                  Regular  rhythm,  No edema  GI:                   Soft, Non distended, Non tender.  +Bowel sounds  Neurologic:      Alert and oriented X 3, normal speech,   Psych:             Good insight. Not anxious nor agitated  Skin:  No rashes.  No jaundice.  Extremities :  No edema, discoloration of  toes++.     Waymon Amato, MD Jerrel Ivory

## 2022-06-12 NOTE — Plan of Care (Signed)
Problem: Physical Therapy - Adult  Goal: By Discharge: Performs mobility at highest level of function for planned discharge setting.  See evaluation for individualized goals.  Description: FUNCTIONAL STATUS PRIOR TO ADMISSION: Patient was independent and active without use of DME. Drove himself to [place] from [place] for [employer] race. Denies home O2 use.     HOME SUPPORT PRIOR TO ADMISSION: The patient lived with wife however wife has her own medical issues and cannot provide physical assist.    Physical Therapy Goals  Initiated 06/10/2022  1.  Patient will move from supine to sit and sit to supine, scoot up and down, and roll side to side in bed with contact guard assist within 7 day(s).   2. Patient will sit EOB x 10 minutes with supervision/set-up assist within 7 days.    2.  Patient will perform sit to stand with moderate assistance x2 within 7 day(s).  3.  Patient will transfer from bed to chair and chair to bed with moderate assistance x2 using the least restrictive device within 7 day(s).  4.  Patient will ambulate with moderate assistance x2 for 5 feet with the least restrictive device within 7 day(s).   Outcome: Progressing     PHYSICAL THERAPY TREATMENT    Patient: Jaime [name] (76 y.o. male)  Date: 06/12/2022  Diagnosis: Septicemia (HCC) [A41.9]  Gastric perforation (HCC) [K25.5]  Perforated abdominal viscus [R19.8] Gastric perforation (HCC)  Procedure(s) (LRB):  LAPAROTOMY EXPLORATORY (N/A)  LAPAROSCOPY DIAGNOSTIC (N/A)  ENDOSCOPY OF ILEAL CONDUIT (N/A) 11 Days Post-Op  Precautions: NPO                    ASSESSMENT:  Patient continues to benefit from skilled PT services and is slowly progressing towards goals. Patient cleared by nursing to participate with therapy. Initially patient was reluctant to getting out of bed due to report of being fatigued, but agreeable to bed level arm and leg exercises. He was able to perform 5-10 reps of leg exercises, rest breaks in between. Noted B quad muscles  are very weak, very minimal contraction observed with quad set. Encouraged patient to perform ankle pumps and quad sets during the day for continued strengthening. After performing exercises he said he was more awake and wanted to stand up. He required maximum assistance x 2 for supine<>sit with HOB elevated. Initial sitting balance was poor and he required maximum assist on EOB, however he was quickly able to progress to holding himself up with only contact guard assist. He sat up for about 10 minutes total before reporting dizziness and needing to lay down, BP did drop. He was able to perform trunk and leg exercises while sitting EOB with only contact guard assist and periodic verbal cues for proper technique. Continue to recommend IPR upon discharge as patient was completely independent prior and is very motivated to participate with therapy.    BP supine 137/57  BP sit 117/98 (after 10 minutes of sitting)         PLAN:  Patient continues to benefit from skilled intervention to address the above impairments.  Continue treatment per established plan of care.    Recommendation for discharge: (in order for the patient to meet his/her long term goals): Therapy 3 hours/day 5-7 days/week    Other factors to consider for discharge: patient's current support system is unable to meet their requirements for physical assistance, high risk for falls, and decreased activity tolerance    IF patient discharges home will  need the following DME: hospital bed, mechanical lift, and wheelchair 22 inch       SUBJECTIVE:   Patient stated, "I want to stand up."    OBJECTIVE DATA SUMMARY:     Functional Mobility Training:  Bed Mobility:  Bed Mobility Training  Bed Mobility Training: Yes  Rolling: Maximum assistance;Assist X2  Supine to Sit: Maximum assistance;Assist X2  Sit to Supine: Maximum assistance;Assist X2  Scooting: Maximum assistance;Assist X2  Transfers:  Transfer Training  Transfer Training: No  Balance:  Balance  Sitting:  Impaired  Sitting - Static: Fair (occasional)  Sitting - Dynamic: Fair (occasional)    Pain Rating:  0/10     Activity Tolerance:   Poor    After treatment:   Patient left in no apparent distress in bed, Call bell within reach, and Side rails x3      COMMUNICATION/EDUCATION:   The patient's plan of care was discussed with: registered nurse    Patient Education  Education Given To: Patient  Education Provided: Home Exercise Program  Education Provided Comments: quad sets and ankle pumps  Education Method: Verbal;Teach Back  Barriers to Learning: None  Education Outcome: Demonstrated understanding;Continued education needed      Jaime [name], PT  Minutes: 31

## 2022-06-12 NOTE — Interdisciplinary (Signed)
Critical care interdisciplinary rounds today.  Following members present: Case Management, Clinical Lead, Nursing, Nutrition, Pharmacy, and Physician. Currently on Hemodialysis. Speech to see patient today regarding swallow.  Plan of care discussed.  See clinical pathway for plan of care and interventions and desired outcomes.

## 2022-06-12 NOTE — Care Coordination-Inpatient (Signed)
Transition of Care Plan:     RUR: 16%  Prior Level of Functioning: Independent  Disposition: TBD  If SNF or IPR: Date FOC offered: TBD  Date FOC received: TBD  Accepting facility: TBD  Date authorization started with reference number: TBD  Date authorization received and expires: TBD  Follow up appointments: To be done prior to discharge.   DME needed: None  Transportation at discharge: Family  IM/IMM Medicare/Tricare letter given: To be given prior to discharge.   Is patient a Veteran and connected with VA? No              If yes, was [employer] transfer form completed and VA notified? N/A  Caregiver Contact: Wife  Discharge Caregiver contacted prior to discharge? Caregiver to be contacted prior to discharge.   Care Conference needed? No  Barriers to discharge:  Medical stability        Spoke with the liason at the [facility] and she states she will send an eligibility request form today to verify the patient and his benefits. She states the eligibility form should be back on tomorrow. She would like the case manager on this weekend to follow up on tomorrow 06/13/22 to see if it is back. They are to call [phone] ext [phone] which is the number to the main clinical command center and someone is there 24/7. Handoff given to weekend CM covering the second floor. Dr Allison Quarry made aware and has the number for the weekend CM to follow up. Informed patient and his wife  and they are  in agreement.           Lynne Takemoto C.[place]  RN BSN CRM    Case Manager    [phone]

## 2022-06-12 NOTE — Plan of Care (Signed)
Speech LAnguage Pathology TREATMENT    Patient: Jaime [name] (76 y.o. male)  Date: 06/12/2022  Primary Diagnosis: Septicemia (HCC) [A41.9]  Gastric perforation (HCC) [K25.5]  Perforated abdominal viscus [R19.8]  Procedure(s) (LRB):  LAPAROTOMY EXPLORATORY (N/A)  LAPAROSCOPY DIAGNOSTIC (N/A)  ENDOSCOPY OF ILEAL CONDUIT (N/A) 11 Days Post-Op   Precautions:  NPO                  ASSESSMENT :  Based on the objective data described below, the patient presents with continued hoarse vocal quality and weak coughing with ice chips s/p prolonged intubation. Therefore, recommend FEES to fully assess pharyngeal swallow function, which will be completed today.    Patient will benefit from skilled intervention to address the above impairments.     PLAN :  Recommendations and Planned Interventions:  Diet: NPO and ice chips  FEES today     Acute SLP Services: Yes, SLP will continue to follow per plan of care.    Discharge Recommendations: Continue to assess pending progress     SUBJECTIVE:   Patient stated, "Ok."    OBJECTIVE:   History reviewed. No pertinent past medical history.  Past Surgical History:   Procedure Laterality Date    BLADDER SURGERY N/A 06/01/2022    ENDOSCOPY OF ILEAL CONDUIT performed by Guinevere Ferrari, MD at [facility]    CT VISCERAL PERCUTANEOUS DRAIN  06/05/2022    CT VISCERAL PERCUTANEOUS DRAIN 06/05/2022 [facility] RAD CT    LAPAROSCOPY N/A 06/01/2022    LAPAROSCOPY DIAGNOSTIC performed by Guinevere Ferrari, MD at [facility]    LAPAROTOMY N/A 06/01/2022    LAPAROTOMY EXPLORATORY performed by Guinevere Ferrari, MD at [facility]     Prior Level of Function/Home Situation:   Social/Functional History  Lives With: Spouse  Type of Home: House  Home Layout: One level  Home Equipment: None  ADL Assistance: Independent  Ambulation Assistance: Independent  Transfer Assistance: Independent  Active Driver: Yes    Baseline Assessment:                Cognitive and Communication Status:  Neurologic State: Alert  Orientation Level: Oriented  x4  Cognition: Follows commands    Dysphagia:  Oral Assessment:     P.O. Trials:  PO Trials  Assessment Method(s): Observation  Patient Position: up in bed  Vocal Quality: Hoarse  Consistency Presented: Ice Chips  How Presented: SLP-fed/Presented;Spoon  Bolus Acceptance: No impairment  Bolus Formation/Control: No impairment  Propulsion: No impairment  Aspiration Signs/Symptoms: Weak cough            Motor Speech:         Language Comprehension and Expression:                   Neuro-Linguistics:                      Voice:       Respiratory Status/Airway:  Nasal cannula                                 After treatment:   Patient left in no apparent distress in bed, Call bell left within reach, and Nursing notified    COMMUNICATION/EDUCATION:   Patient was educated regarding his deficit(s) of dysphagia as this relates to his diagnosis.  He demonstrated Fair understanding as evidenced by Nodding.    The patient's plan of care  including recommendations, planned interventions, and recommended diet changes were discussed with: Registered nurse    Patient/family have participated as able in goal setting and plan of care and Patient/family agree to work toward stated goals and plan of care    Thank you,  Nelson Chimes, SLP  Minutes: 10    Problem: SLP Adult - Impaired Swallowing  Goal: By Discharge: Advance to least restrictive diet without signs or symptoms of aspiration for planned discharge setting.  See evaluation for individualized goals.  Description: Speech Pathology Goals  Initiated 06/10/2022    1. Patient will participate in re-evaluation of swallow function within 7 days.  Outcome: Progressing

## 2022-06-12 NOTE — Progress Notes (Signed)
NAME: Jaime [name]        DOB:  Aug 26, 1946        MRN:  [id number]                     Assessment   :                                               Plan:  AKI/ATN  Sepsis  Hypotension>>HTN.  Resp failure  DM-2  Shock liver       CVVHD started 7/31 until 8/6; Right IJ [place] 8/8     HD today and a short HD tomorrow     Watch for kidney recovery - none so far; anuric (CT on 8/7 with empty bladder and no hydro; 8/10 bladder scan no pvr)             Subjective:     Chief Complaint:   sleeping, The patient was seen on dialysis at 11 am .  BP is stable. Catheter is functioning well.     Review of Systems:    Symptom Y/N Comments  Symptom Y/N Comments   Fever/Chills    Chest Pain     Poor Appetite    Edema     Cough    Abdominal Pain     Sputum    Joint Pain     SOB/DOE    Pruritis/Rash     Nausea/vomit    Tolerating PT/OT     Diarrhea    Tolerating Diet     Constipation    Other       Could not obtain due to:      Objective:     VITALS:   Last 24hrs VS reviewed since prior progress note. Most recent are:  Vitals:    06/12/22 1400   BP: (!) 127/57   Pulse: 88   Resp: 25   Temp:    SpO2: 93%       Intake/Output Summary (Last 24 hours) at 06/12/2022 1513  Last data filed at 06/12/2022 1155  Gross per 24 hour   Intake 960 ml   Output 2815 ml   Net -1855 ml        Telemetry Reviewed:     PHYSICAL EXAM:  General: Intubated, alert awake  +ETT  Clear  RRR  +edema- L > R  Purple toes+      Lab Data Reviewed: (see below)    Medications Reviewed: (see below)    PMH/SH reviewed - no change compared to H&P  ________________________________________________________________________  Care Plan discussed with:  Patient     Family      RN     Care Manager                    Consultant:          Comments   >50% of visit spent in counseling and coordination of care       ________________________________________________________________________  Tanna Savoy, MD      Procedures: see electronic medical records for all procedures/Xrays and details which  were not copied into this note but were reviewed prior to creation of Plan.      LABS:  Recent Labs     06/11/22  0513 06/12/22  0405   WBC 29.5* 30.3*   HGB 8.8* 9.1*  HCT 25.6* 26.6*   PLT 173 215       Recent Labs     06/10/22  0358 06/11/22  0513 06/12/22  0405   NA 133* 133* 131*   K 5.3* 5.2* 6.4*   CL 104 103 102   CO2 20* 21 20*   BUN 83* 63* 93*   MG 2.8* 2.8* 3.0*   PHOS 6.1* 6.4* 9.0*       No results for input(s): TP, ALB, GLOB, GGT, AML in the last 72 hours.    Invalid input(s): SGOT, GPT, AP, TBIL, AMYP, LPSE, HLPSE    MEDICATIONS:  Current Facility-Administered Medications   Medication Dose Route Frequency    bisacodyl (DULCOLAX) suppository 10 mg  10 mg Rectal Daily PRN    insulin lispro (HUMALOG) injection vial 0-8 Units  0-8 Units SubCUTAneous TID WC    insulin lispro (HUMALOG) injection vial 0-4 Units  0-4 Units SubCUTAneous Nightly    albumin human 25% IV solution 25 g  25 g IntraVENous PRN    amiodarone (CORDARONE) tablet 400 mg  400 mg Oral Daily    heparin (porcine) injection 1,200 Units  1,200 Units IntraCATHeter PRN    And    heparin (porcine) injection 1,000 Units  1,000 Units IntraCATHeter PRN    polyethylene glycol (GLYCOLAX) packet 17 g  17 g Oral Daily    sennosides-docusate sodium (SENOKOT-S) 8.6-50 MG tablet 2 tablet  2 tablet Oral BID    heparin (porcine) injection 5,000 Units  5,000 Units SubCUTAneous 3 times per day    lansoprazole (PREVACID SOLUTAB) disintegrating tablet 30 mg  30 mg Per NG tube Daily    cefTRIAXone (ROCEPHIN) 2,000 mg in sodium chloride 0.9 % 50 mL IVPB (mini-bag)  2,000 mg IntraVENous Q24H    metronidazole (FLAGYL) 500 mg in 0.9% NaCl 100 mL IVPB premix  500 mg IntraVENous Q8H    hydrALAZINE (APRESOLINE) injection 10 mg  10 mg IntraVENous Q4H PRN    sodium chloride 0.9 % bolus 100 mL  100 mL IntraVENous PRN    fentaNYL (SUBLIMAZE) injection 50 mcg  50 mcg IntraVENous Q1H  PRN    balsum peru-castor oil (VENELEX) ointment   Topical BID    glucose chewable tablet 16 g  4 tablet Oral PRN    dextrose bolus 10% 125 mL  125 mL IntraVENous PRN    Or    dextrose bolus 10% 250 mL  250 mL IntraVENous PRN    glucagon (rDNA) injection 1 mg  1 mg SubCUTAneous PRN    dextrose 10 % infusion   IntraVENous Continuous PRN    sodium chloride flush 0.9 % injection 5-40 mL  5-40 mL IntraVENous 2 times per day    sodium chloride flush 0.9 % injection 5-40 mL  5-40 mL IntraVENous PRN    0.9 % sodium chloride infusion   IntraVENous PRN    acetaminophen (TYLENOL) tablet 650 mg  650 mg Oral Q6H PRN    Or    acetaminophen (TYLENOL) suppository 650 mg  650 mg Rectal Q6H PRN    ondansetron (ZOFRAN) injection 4 mg  4 mg IntraVENous Q6H PRN

## 2022-06-13 LAB — POCT GLUCOSE
POC Glucose: 97 mg/dL (ref 65–117)
POC Glucose: 121 mg/dL — ABNORMAL HIGH (ref 65–117)
POC Glucose: 126 mg/dL — ABNORMAL HIGH (ref 65–117)
POC Glucose: 114 mg/dL (ref 65–117)

## 2022-06-13 MED FILL — METRONIDAZOLE 500 MG/100ML IV SOLN: 500 MG/100ML | INTRAVENOUS | Qty: 100

## 2022-06-13 MED FILL — HEPARIN SODIUM (PORCINE) 5000 UNIT/ML IJ SOLN: 5000 UNIT/ML | INTRAMUSCULAR | Qty: 1

## 2022-06-13 MED FILL — BALSAM PERU-CASTOR OIL TOPICAL OINTMENT: CUTANEOUS | Qty: 56.7

## 2022-06-13 MED FILL — CEFTRIAXONE SODIUM 2 G IJ SOLR: 2 g | INTRAMUSCULAR | Qty: 2000

## 2022-06-13 MED FILL — LANSOPRAZOLE 30 MG PO TBDD: 30 MG | ORAL | Qty: 1

## 2022-06-13 MED FILL — AMIODARONE HCL 200 MG PO TABS: 200 MG | ORAL | Qty: 2

## 2022-06-13 MED FILL — SENNA PLUS 8.6-50 MG PO TABS: ORAL | Qty: 2

## 2022-06-13 MED FILL — PEG 3350 17 G PO PACK: 17 g | ORAL | Qty: 1

## 2022-06-13 MED FILL — ALBUTEIN 25 % IV SOLN: 25 % | INTRAVENOUS | Qty: 100

## 2022-06-13 NOTE — Progress Notes (Addendum)
1532: Report called to Jorje Guild, RN by Feliz Beam ,RN  : 2 L off from HD .. given some albumin.. stable at this time, Pt on his way back to floor   1541: Pt arrived back on floor .Marland Kitchen No needs expressed at this time. VSS call bell in reach, bed in lowest position ,bed alarm on.    End of Shift Note    Bedside shift change report given to [facility] (oncoming nurse) by Kym Groom, RN (offgoing nurse).  Report included the following information SBAR, Intake/Output, and Cardiac Rhythm NSR/Sinus Tach     Shift worked:  0700-1900     Shift summary and any significant changes:     Please call Podiatry in the morning !!! 8/13       Concerns for physician to address:  Back to home      Zone phone for oncoming shift:          Activity:     Number times ambulated in hallways past shift: 0  Number of times OOB to chair past shift: 0    Cardiac:   Cardiac Monitoring: Yes           Access:  Current line(s): PIV and HD access     Genitourinary:   Urinary status: anuric    Respiratory:      Chronic home O2 use?: NO  Incentive spirometer at bedside: NO       GI:     Current diet:  ADULT TUBE FEEDING; Nasogastric; Renal Formula; Continuous; 10; Yes; 10; Q 24 hours; 40; 50; Q 4 hours; Protein; Prosource BID  Passing flatus: YES  Tolerating current diet: YES       Pain Management:   Patient states pain is manageable on current regimen: YES    Skin:     Interventions: specialty bed, float heels, PT/OT consult, and internal/external urinary devices    Patient Safety:  Fall Score:    Interventions: bed/chair alarm, gripper socks, and pt to call before getting OOB       Length of Stay:  Expected LOS: 5  Actual LOS: 12      Kym Groom, RN

## 2022-06-13 NOTE — Care Coordination-Inpatient (Signed)
Transition of Care Plan:     RUR: 18%  Prior Level of Functioning: Independent  Disposition: TBD  If SNF or IPR: Date FOC offered: TBD  Date FOC received: TBD  Accepting facility: TBD  Date authorization started with reference number: TBD  Date authorization received and expires: TBD  Follow up appointments: To be done prior to discharge.   DME needed: None  Transportation at discharge: Family  IM/IMM Medicare/Tricare letter given: To be given prior to discharge.   Is patient a Veteran and connected with VA? No              If yes, was [employer] transfer form completed and VA notified? N/A  Caregiver Contact: Wife  Discharge Caregiver contacted prior to discharge? Caregiver to be contacted prior to discharge.   Care Conference needed? No  Barriers to discharge:  Medical stability    CM spoke to [place] at [facility] via phone at [phone] regarding the eligibility request form that was sent yesterday. Alinda Money requested for CM to faxed information on pt. CM faxed information to Alinda Money.    CM spoke to pt regarding the recommendation is for inpatient rehab. CM provided a list of inpatient rehab in [place]. CM also provided a list of SNF placement in [place] and explain to pt to choice a SNF facility has a back. Pt stated he understood and wanted to speak to his family.    CM will follow up with pt regarding placement.    Pt has the potential of d/c on Monday.    Jaime [name]  Social Worker/Case manager  [phone]

## 2022-06-13 NOTE — Plan of Care (Signed)
Problem: Safety - Adult  Goal: Free from fall injury  Outcome: Progressing     Problem: Respiratory - Adult  Goal: Achieves optimal ventilation and oxygenation  Outcome: Progressing     Problem: Pain  Goal: Verbalizes/displays adequate comfort level or baseline comfort level  Outcome: Progressing     Problem: Discharge Planning  Goal: Discharge to home or other facility with appropriate resources  Outcome: Progressing     Problem: Chronic Conditions and Co-morbidities  Goal: Patient's chronic conditions and co-morbidity symptoms are monitored and maintained or improved  Outcome: Progressing     Problem: Neurosensory - Adult  Goal: Achieves maximal functionality and self care  Outcome: Progressing     Problem: Cardiovascular - Adult  Goal: Maintains optimal cardiac output and hemodynamic stability  Outcome: Progressing  Goal: Absence of cardiac dysrhythmias or at baseline  Outcome: Progressing     Problem: Gastrointestinal - Adult  Goal: Maintains or returns to baseline bowel function  Outcome: Progressing  Goal: Maintains adequate nutritional intake  Outcome: Progressing     Problem: Genitourinary - Adult  Goal: Absence of urinary retention  Outcome: Progressing     Problem: Metabolic/Fluid and Electrolytes - Adult  Goal: Electrolytes maintained within normal limits  Outcome: Progressing  Goal: Hemodynamic stability and optimal renal function maintained  Outcome: Progressing  Goal: Glucose maintained within prescribed range  Outcome: Progressing     Problem: Skin/Tissue Integrity - Adult  Goal: Skin integrity remains intact  Outcome: Progressing  Goal: Incisions, wounds, or drain sites healing without S/S of infection  Outcome: Progressing  Goal: Oral mucous membranes remain intact  Outcome: Progressing     Problem: Hematologic - Adult  Goal: Maintains hematologic stability  Outcome: Progressing     Problem: Musculoskeletal - Adult  Goal: Return mobility to safest level of function  Outcome: Progressing  Goal:  Maintain proper alignment of affected body part  Outcome: Progressing  Goal: Return ADL status to a safe level of function  Outcome: Progressing     Problem: Skin/Tissue Integrity  Goal: Absence of new skin breakdown  Description: 1.  Monitor for areas of redness and/or skin breakdown  2.  Assess vascular access sites hourly  3.  Every 4-6 hours minimum:  Change oxygen saturation probe site  4.  Every 4-6 hours:  If on nasal continuous positive airway pressure, respiratory therapy assess nares and determine need for appliance change or resting period.  Outcome: Progressing     Problem: ABCDS Injury Assessment  Goal: Absence of physical injury  Outcome: Progressing

## 2022-06-13 NOTE — Other (Signed)
Primary RN SBAR: Ladoris Gene, RN  Patient Education: HD Treatment  Hepatitis B Surface Ag   Date/Time Value Ref Range Status   06/01/2022 10:08 PM <0.10 Index Final     Hep B S Ab   Date/Time Value Ref Range Status   06/01/2022 10:08 PM <3.10 mIU/mL Final        06/13/22 1251   Vital Signs   BP (!) 130/58   Temp 98.4 F (36.9 C)   Pulse 79   Respirations 18   Pain Assessment   Pain Assessment None - Denies Pain   Treatment   Time On 1251   Treatment Goal   Observations & Evaluations   Level of Consciousness 0   Oriented X 4   Heart Rhythm Regular   Respiratory Quality/Effort Unlabored   O2 Device Nasal cannula  (2 L)   Bilateral Breath Sounds Diminished   Skin Color Ecchymosis (comment);Jaundice   Skin Condition/Temp Dry;Warm   Abdomen Inspection Distended;Soft   Bowel Sounds (All Quadrants) Active   Edema Right lower extremity;Left lower extremity   RLE Edema +2;+3   LLE Edema +2;+3   Technical Checks   Dialysis Machine No. 05   RO Machine Number [id number]   Dialyzer Lot No. [id number]   Tubing Lot Number [id number]   All Connections Secure Yes   NS Bag Yes   Saline Line Double Clamped Yes   Dialyzer Revaclear 300   Prime Volume (mL) 200 mL   ICEBOAT I;C;E;B;O;A;T   RO Machine Log Sheet Completed Yes   Machine Alarm Self Test Completed;Passed   Child psychotherapist Function   Extracorporeal Chemical engineer Conductivity 13.7   Manual Ph 7.2   Bleach Test (Neg) Yes   Bath Temperature 98.6 F (37 C)   Treatment Initiation   Dialyze Hours 3.5   Treatment  Initiation Universal Precautions maintained;Lines secured to patient;Connections secured;Prime given;Venous Parameters set;Arterial Parameters set;IT consultant engaged;Saline line double clamped;Revaclear Dialyzer   Dialysis Bath   K+ (Potassium) 2   Ca+ (Calcium) 2.5   Na+ (Sodium) 139   HCO3 (Bicarb) 35   Hemodialysis Central Access Right Neck   Placement Date/Time: 06/09/22 1830   Present on Admission/Arrival: No   Inserted by: dr. Rutherford Limerick  Orientation: Right  Access Location: Neck   Continued need for line? Yes   Site Assessment Clean, dry & intact   Venous Lumen Status Blood return noted;Flushed   Arterial Lumen Status Blood return noted;Flushed   Line Care Connections checked and tightened;Ports disinfected   Dressing Type Bacteriocidal;Transparent   Date of Last Dressing Change 06/12/22   Dressing Status Clean, dry & intact   Dressing Change Due 06/16/22

## 2022-06-13 NOTE — Plan of Care (Signed)
Problem: Safety - Adult  Goal: Free from fall injury  06/13/2022 2243 by Blima Dessert, RN  Outcome: Progressing  06/13/2022 1550 by Kym Groom, RN  Outcome: Progressing     Problem: Respiratory - Adult  Goal: Achieves optimal ventilation and oxygenation  06/13/2022 2243 by Blima Dessert, RN  Outcome: Progressing  Flowsheets (Taken 06/13/2022 1901)  Achieves optimal ventilation and oxygenation:   Assess for changes in respiratory status   Assess for changes in mentation and behavior   Position to facilitate oxygenation and minimize respiratory effort   Oxygen supplementation based on oxygen saturation or arterial blood gases   Initiate smoking cessation protocol as indicated   Encourage broncho-pulmonary hygiene including cough, deep breathe, incentive spirometry   Assess the need for suctioning and aspirate as needed   Assess and instruct to report shortness of breath or any respiratory difficulty   Respiratory therapy support as indicated  06/13/2022 1550 by Kym Groom, RN  Outcome: Progressing     Problem: Pain  Goal: Verbalizes/displays adequate comfort level or baseline comfort level  06/13/2022 2243 by Blima Dessert, RN  Outcome: Progressing  Flowsheets (Taken 06/13/2022 1901)  Verbalizes/displays adequate comfort level or baseline comfort level:   Encourage patient to monitor pain and request assistance   Assess pain using appropriate pain scale   Administer analgesics based on type and severity of pain and evaluate response   Implement non-pharmacological measures as appropriate and evaluate response   Consider cultural and social influences on pain and pain management   Notify Licensed Independent Practitioner if interventions unsuccessful or patient reports new pain  06/13/2022 1550 by Kym Groom, RN  Outcome: Progressing     Problem: Discharge Planning  Goal: Discharge to home or other facility with appropriate resources  06/13/2022 2243 by Blima Dessert, RN  Outcome: Progressing  06/13/2022 1550 by  Kym Groom, RN  Outcome: Progressing     Problem: Chronic Conditions and Co-morbidities  Goal: Patient's chronic conditions and co-morbidity symptoms are monitored and maintained or improved  06/13/2022 2243 by Blima Dessert, RN  Outcome: Progressing  06/13/2022 1550 by Kym Groom, RN  Outcome: Progressing     Problem: Neurosensory - Adult  Goal: Achieves maximal functionality and self care  06/13/2022 2243 by Blima Dessert, RN  Outcome: Progressing  Flowsheets (Taken 06/13/2022 1901)  Achieves maximal functionality and self care:   Monitor swallowing and airway patency with patient fatigue and changes in neurological status   Encourage and assist patient to increase activity and self care with guidance from physical therapy/occupational therapy   Encourage visually impaired, hearing impaired and aphasic patients to use assistive/communication devices  06/13/2022 1550 by Kym Groom, RN  Outcome: Progressing     Problem: Cardiovascular - Adult  Goal: Maintains optimal cardiac output and hemodynamic stability  06/13/2022 2243 by Blima Dessert, RN  Outcome: Progressing  Flowsheets (Taken 06/13/2022 1901)  Maintains optimal cardiac output and hemodynamic stability:   Monitor blood pressure and heart rate   Monitor urine output and notify Licensed Independent Practitioner for values outside of normal range   Assess for signs of decreased cardiac output   Administer fluid and/or volume expanders as ordered   Administer vasoactive medications as ordered  06/13/2022 1550 by Kym Groom, RN  Outcome: Progressing  Goal: Absence of cardiac dysrhythmias or at baseline  06/13/2022 2243 by Blima Dessert, RN  Outcome: Progressing  Flowsheets (Taken 06/13/2022 1901)  Absence of cardiac dysrhythmias or at baseline:  Monitor cardiac rate and rhythm   Assess for signs of decreased cardiac output   Administer antiarrhythmia medication and electrolyte replacement as ordered  06/13/2022 1550 by Kym Groom, RN  Outcome:  Progressing     Problem: Gastrointestinal - Adult  Goal: Maintains or returns to baseline bowel function  06/13/2022 2243 by Blima Dessert, RN  Outcome: Progressing  Flowsheets (Taken 06/13/2022 1901)  Maintains or returns to baseline bowel function:   Assess bowel function   Encourage oral fluids to ensure adequate hydration   Administer IV fluids as ordered to ensure adequate hydration   Administer ordered medications as needed   Encourage mobilization and activity   Nutrition consult to assist patient with appropriate food choices  06/13/2022 1550 by Kym Groom, RN  Outcome: Progressing  Goal: Maintains adequate nutritional intake  06/13/2022 2243 by Blima Dessert, RN  Outcome: Progressing  Flowsheets (Taken 06/13/2022 1901)  Maintains adequate nutritional intake:   Monitor percentage of each meal consumed   Identify factors contributing to decreased intake, treat as appropriate   Assist with meals as needed   Monitor intake and output, weight and lab values   Obtain nutritional consult as needed  06/13/2022 1550 by Kym Groom, RN  Outcome: Progressing     Problem: Genitourinary - Adult  Goal: Absence of urinary retention  06/13/2022 2243 by Blima Dessert, RN  Outcome: Progressing  Flowsheets (Taken 06/13/2022 1901)  Absence of urinary retention:   Assess patient's ability to void and empty bladder   Monitor intake/output and perform bladder scan as needed   Place urinary catheter per Licensed Independent Practitioner order if needed   Discuss with Licensed Independent Practitioner  medications to alleviate retention as needed   Discuss catheterization for long term situations as appropriate  06/13/2022 1550 by Kym Groom, RN  Outcome: Progressing     Problem: Metabolic/Fluid and Electrolytes - Adult  Goal: Electrolytes maintained within normal limits  06/13/2022 2243 by Blima Dessert, RN  Outcome: Progressing  06/13/2022 1550 by Kym Groom, RN  Outcome: Progressing  Goal: Hemodynamic stability and optimal  renal function maintained  06/13/2022 2243 by Blima Dessert, RN  Outcome: Progressing  06/13/2022 1550 by Kym Groom, RN  Outcome: Progressing  Goal: Glucose maintained within prescribed range  06/13/2022 2243 by Blima Dessert, RN  Outcome: Progressing  06/13/2022 1550 by Kym Groom, RN  Outcome: Progressing     Problem: Skin/Tissue Integrity - Adult  Goal: Skin integrity remains intact  06/13/2022 2243 by Blima Dessert, RN  Outcome: Progressing  Flowsheets (Taken 06/13/2022 1901)  Skin Integrity Remains Intact:   Monitor for areas of redness and/or skin breakdown   Assess vascular access sites hourly  06/13/2022 1550 by Kym Groom, RN  Outcome: Progressing  Goal: Incisions, wounds, or drain sites healing without S/S of infection  06/13/2022 2243 by Blima Dessert, RN  Outcome: Progressing  Flowsheets (Taken 06/13/2022 1901)  Incisions, Wounds, or Drain Sites Healing Without Sign and Symptoms of Infection:   ADMISSION and DAILY: Assess and document risk factors for pressure ulcer development   Implement wound care per orders  06/13/2022 1550 by Kym Groom, RN  Outcome: Progressing  Goal: Oral mucous membranes remain intact  06/13/2022 2243 by Blima Dessert, RN  Outcome: Progressing  Flowsheets (Taken 06/13/2022 1901)  Oral Mucous Membranes Remain Intact:   Assess oral mucosa and hygiene practices   Implement preventative oral hygiene regimen   Implement oral medicated treatments as ordered  06/13/2022 1550 by Kym Groom, RN  Outcome: Progressing     Problem: Hematologic - Adult  Goal: Maintains hematologic stability  06/13/2022 2243 by Blima Dessert, RN  Outcome: Progressing  06/13/2022 1550 by Kym Groom, RN  Outcome: Progressing     Problem: Musculoskeletal - Adult  Goal: Return mobility to safest level of function  06/13/2022 2243 by Blima Dessert, RN  Outcome: Progressing  Flowsheets (Taken 06/13/2022 1901)  Return Mobility to Safest Level of Function:   Assess patient stability and activity tolerance for  standing, transferring and ambulating with or without assistive devices   Assist with transfers and ambulation using safe patient handling equipment as needed   Ensure adequate protection for wounds/incisions during mobilization   Obtain physical therapy/occupational therapy consults as needed   Apply continuous passive motion per provider or physical therapy orders to increase flexion toward goal   Instruct patient/family in ordered activity level  06/13/2022 1550 by Kym Groom, RN  Outcome: Progressing  Goal: Maintain proper alignment of affected body part  06/13/2022 2243 by Blima Dessert, RN  Outcome: Progressing  Flowsheets (Taken 06/13/2022 1901)  Maintain proper alignment of affected body part:   Support and protect limb and body alignment per provider's orders   Instruct and reinforce with patient and family use of appropriate assistive device and precautions (e.g. spinal or hip dislocation precautions)  06/13/2022 1550 by Kym Groom, RN  Outcome: Progressing  Goal: Return ADL status to a safe level of function  06/13/2022 2243 by Blima Dessert, RN  Outcome: Progressing  Flowsheets (Taken 06/13/2022 1901)  Return ADL Status to a Safe Level of Function:   Administer medication as ordered   Assess activities of daily living deficits and provide assistive devices as needed   Obtain physical therapy/occupational therapy consults as needed   Assist and instruct patient to increase activity and self care as tolerated  06/13/2022 1550 by Kym Groom, RN  Outcome: Progressing     Problem: Skin/Tissue Integrity  Goal: Absence of new skin breakdown  Description: 1.  Monitor for areas of redness and/or skin breakdown  2.  Assess vascular access sites hourly  3.  Every 4-6 hours minimum:  Change oxygen saturation probe site  4.  Every 4-6 hours:  If on nasal continuous positive airway pressure, respiratory therapy assess nares and determine need for appliance change or resting period.  06/13/2022 2243 by Blima Dessert, RN  Outcome: Progressing  06/13/2022 1550 by Kym Groom, RN  Outcome: Progressing     Problem: ABCDS Injury Assessment  Goal: Absence of physical injury  06/13/2022 2243 by Blima Dessert, RN  Outcome: Progressing  06/13/2022 1550 by Kym Groom, RN  Outcome: Progressing

## 2022-06-13 NOTE — Progress Notes (Signed)
Hospitalist Progress Note    NAME:   Jaime [name]   DOB: [birth year]   MRN: [id number]     Date/Time: 06/13/2022 4:18 PM  Patient PCP: No primary care provider on file.    Estimated discharge date: >48 hrs  Barriers: Will need SLP/diet advancement, eventual placement    76 y/o male ([place] [place]) with PMHx significant of HTN, HLD and T2DM. Came in ED 7/30 noon for N/V/Abd pain and generalized weakess. Found to be encephalopathic, shock state (BP 80s and lactate 10), AKI (Cr 4.06). Received 2L LR, started on Levophed, Vancomycin/Zosyn. Surgery consulted and patient underwent emergent exploratory laparotomy last night that did not reveal perforation but fluid collection in right upper quadrant that was sent for cultures and sensitivity.     Assessment / Plan:    Septic shock, 2/2 intra abdominal source.  Resolved.   Acute abdomen from abdominal viscus perforation              S/p emergent exploratory laparotomy 06/01/22               Stress dose steroids added 06/01/22 - weaning.              Fludrocortisone added 06/02/22, stopped on 8/4              Vanc stopped 06/02/22.               BC growing enterobacter and e coli 7/31              Currently on rocephin and flagyl,ID following      Liver abscess involving most of left lobe  S/p drain placement on 8/4. Growing E coli              Repeat CT on 8/3 showed Liver abscesses s/p drain placement               CT on 8/7 showed decreased size of abscess post drain placement.    Antimicrobial orders for discharge  -Ceftriaxone 2 g  IV daily  end date 9/15  -Flagyl 500 mg p.o. twice daily end date 8/18  -Pull PICC at end of therapy on/ after 9/15 &  Imaging of liver  -Weekly CBC, CMP-  -Fax reports to [phone], call with critical labs  at [phone]  -Encourage adequate fluids, daily probiotic/yogurt  -If PICC malfunction occurs and home health cannot reposition  please send patient to ED immediately  -ID follow-up -call [phone] for appointment- if pt to  stay in   [place]     Elevated troponin, likely demand ischemia with non specific EKG changes  VT with further hemodynamic compromise 06/02/22  Controlled now with PO amio              S/p electric CV x 2 followed by amio bolus 150 mg x2 on 06/01/22 AM              Amio drip, transitioned to PO.                Acute respiratory failure with hypoxemia and hypercapnia, ventilatory dependant              Intubated 06/01/22 and extubated on 8/7.              Uneventful post extubation course      Acute encephalopathy, likely metabolic and toxic from sepsis. Resolved.   Propofol infusion toxicity/ intolerance, resolved.  Rising Cpk TG and cardiac arrythmia noted 06/02/22              Propofol changed to versed 06/02/22 but now both are off.      Acute Kidney injury, anuric. Likely ATN, dialysis dependant now               CVVHDF initiated 06/01/22 through right Fem NTC              Dialysis dependant now, not good indications now for renal recover, appreciate continued nephrology guidance.                 Failed swallow eval              NG in place with tube feeds ongoing               FEES failed, now with dobhoff-guidance by SLP appreciate     LE gangrene/ discoloration              Podiatry consulted 06/11/22 -maybe wasn't called? Don't see a note yet    Medical Decision Making:   I personally reviewed labs: CBC, BMP  I personally reviewed imaging:  I personally reviewed EKG:  Toxic drug monitoring:   Discussed case with:          Code Status: FULL  DVT Prophylaxis: Heparin  GI Prophylaxis: Not indicated    Subjective:     Chief Complaint / Reason for Physician Visit  Afebrile.   Doing well today.  No new complaints.  Thinks he was seen by podiatry? Still with dusky toes. Can wiggle without pain. Warm to touch.   Discussed with RN events overnight.       Objective:     VITALS:   Last 24hrs VS reviewed since prior progress note. Most recent are:  Patient Vitals for the past 24 hrs:   BP Temp Temp src Pulse Resp  SpO2   06/13/22 1540 (!) 125/45 98.7 F (37.1 C) Oral 91 18 97 %   06/13/22 1522 (!) 122/55 98.8 F (37.1 C) -- 92 18 --   06/13/22 1515 (!) 90/48 -- -- 96 -- --   06/13/22 1500 (!) 105/50 -- -- 92 -- --   06/13/22 1445 (!) 101/49 -- -- 93 -- --   06/13/22 1430 (!) 97/54 -- -- 93 -- --   06/13/22 1415 (!) 89/50 -- -- 94 -- --   06/13/22 1400 (!) 100/48 -- -- 94 -- --   06/13/22 1345 (!) 101/54 -- -- 92 -- --   06/13/22 1330 (!) 97/53 -- -- 95 -- --   06/13/22 1315 (!) 101/52 -- -- 88 -- --   06/13/22 1300 (!) 134/59 -- -- 82 -- --   06/13/22 1251 (!) 130/58 98.4 F (36.9 C) -- 79 18 --   06/13/22 1119 (!) 131/49 97.3 F (36.3 C) Oral 81 22 95 %   06/13/22 0728 (!) 131/55 97 F (36.1 C) Oral 85 20 98 %   06/13/22 0249 (!) 123/54 98.4 F (36.9 C) -- 88 24 96 %   06/12/22 2020 (!) 127/49 98.3 F (36.8 C) -- 94 18 96 %   06/12/22 1800 (!) 148/54 -- -- 94 23 91 %   06/12/22 1700 (!) 128/52 -- -- 90 28 95 %           Intake/Output Summary (Last 24 hours) at 06/13/2022 1618  Last data filed at 06/13/2022 1540  Gross per 24 hour  Intake 600 ml   Output 2500 ml   Net -1900 ml          I had a face to face encounter and independently examined this patient on 06/13/2022, as outlined below:  PHYSICAL EXAM:  General: Alert, cooperative  EENT:  EOMI. Anicteric sclerae.  Resp:  CTA bilaterally, no wheezing or rales.  No accessory muscle use  CV:  Regular  rhythm,  No edema  GI:  Soft, Non distended, Non tender.  +Bowel sounds  Neurologic:  Alert and oriented X 3, normal speech,   Psych:   Good insight. Not anxious nor agitated  Skin:  No rashes.  No jaundice    Reviewed most current lab test results and cultures  YES  Reviewed most current radiology test results   YES  Review and summation of old records today    NO  Reviewed patient's current orders and MAR    YES  PMH/SH reviewed - no change compared to H&P  ________________________________________________________________________  Care Plan discussed with:    Comments    Patient x    Family      RN x    Care Manager     Consultant                        Multidiciplinary team rounds were held today with case manager, nursing, pharmacist and Higher education careers adviser.  Patient's plan of care was discussed; medications were reviewed and discharge planning was addressed.     ________________________________________________________________________  Total NON critical care TIME:    Minutes    Total CRITICAL CARE TIME Spent:   Minutes non procedure based      Comments   >50% of visit spent in counseling and coordination of care     ________________________________________________________________________  Monte Fantasia, MD     Procedures: see electronic medical records for all procedures/Xrays and details which were not copied into this note but were reviewed prior to creation of Plan.      LABS:  I reviewed today's most current labs and imaging studies.  Pertinent labs include:  Recent Labs     06/11/22  0513 06/12/22  0405   WBC 29.5* 30.3*   HGB 8.8* 9.1*   HCT 25.6* 26.6*   PLT 173 215       Recent Labs     06/11/22  0513 06/12/22  0405   NA 133* 131*   K 5.2* 6.4*   CL 103 102   CO2 21 20*   GLUCOSE 134* 116*   BUN 63* 93*   CREATININE 4.53* 5.76*   CALCIUM 7.7* 7.8*   MG 2.8* 3.0*   PHOS 6.4* 9.0*         Signed: Monte Fantasia, MD

## 2022-06-13 NOTE — Progress Notes (Signed)
NAME: Jaime [name]        DOB:  [birth year]        MRN:  [id number]                     Assessment   :                                               Plan:  AKI/ATN  Hyperkalemia  Hyponatremia   Sepsis  Hypotension>>HTN.  Resp failure  DM-2  Shock liver       CVVHD initiated 7/31 until 8/6; Right IJ quinton 8/8     iHD today and then MWF next week - UF as able and 2 K bath    Watch for kidney recovery - none so far; anuric (CT on 8/7 with empty bladder and no hydro; 8/10 bladder scan no pvr)      Will see again on Monday, but please call with questions or changes in the meantime.          Subjective:     Chief Complaint:   alert. No specific complaint. The patient was seen on dialysis at 12:52 pm .  BP is stable. Catheter is functioning well.     Review of Systems:    Symptom Y/N Comments  Symptom Y/N Comments   Fever/Chills    Chest Pain     Poor Appetite    Edema     Cough    Abdominal Pain     Sputum    Joint Pain     SOB/DOE    Pruritis/Rash     Nausea/vomit    Tolerating PT/OT     Diarrhea    Tolerating Diet     Constipation    Other       Could not obtain due to:      Objective:     VITALS:   Last 24hrs VS reviewed since prior progress note. Most recent are:  Vitals:    06/13/22 0249   BP: (!) 123/54   Pulse: 88   Resp: 24   Temp: 98.4 F (36.9 C)   SpO2: 96%       Intake/Output Summary (Last 24 hours) at 06/13/2022 0606  Last data filed at 06/12/2022 2300  Gross per 24 hour   Intake 750 ml   Output 2530 ml   Net -1780 ml        Telemetry Reviewed:     PHYSICAL EXAM:  General: Intubated, alert awake  +ETT  Clear  RRR  +edema- L > R  Purple toes+      Lab Data Reviewed: (see below)    Medications Reviewed: (see below)    PMH/SH reviewed - no change compared to H&P  ________________________________________________________________________  Care Plan discussed with:  Patient     Family      RN     Care Manager                    Consultant:           Comments   >50% of visit spent in counseling and coordination of care       ________________________________________________________________________  Tanna Savoy, MD     Procedures: see electronic medical records for all procedures/Xrays and details which  were not copied into this note but were reviewed  prior to creation of Plan.      LABS:  Recent Labs     06/11/22  0513 06/12/22  0405   WBC 29.5* 30.3*   HGB 8.8* 9.1*   HCT 25.6* 26.6*   PLT 173 215       Recent Labs     06/11/22  0513 06/12/22  0405   NA 133* 131*   K 5.2* 6.4*   CL 103 102   CO2 21 20*   BUN 63* 93*   MG 2.8* 3.0*   PHOS 6.4* 9.0*       No results for input(s): TP, ALB, GLOB, GGT, AML in the last 72 hours.    Invalid input(s): SGOT, GPT, AP, TBIL, AMYP, LPSE, HLPSE    MEDICATIONS:  Current Facility-Administered Medications   Medication Dose Route Frequency    bisacodyl (DULCOLAX) suppository 10 mg  10 mg Rectal Daily PRN    insulin lispro (HUMALOG) injection vial 0-8 Units  0-8 Units SubCUTAneous TID WC    insulin lispro (HUMALOG) injection vial 0-4 Units  0-4 Units SubCUTAneous Nightly    albumin human 25% IV solution 25 g  25 g IntraVENous PRN    amiodarone (CORDARONE) tablet 400 mg  400 mg Oral Daily    heparin (porcine) injection 1,200 Units  1,200 Units IntraCATHeter PRN    And    heparin (porcine) injection 1,000 Units  1,000 Units IntraCATHeter PRN    polyethylene glycol (GLYCOLAX) packet 17 g  17 g Oral Daily    sennosides-docusate sodium (SENOKOT-S) 8.6-50 MG tablet 2 tablet  2 tablet Oral BID    heparin (porcine) injection 5,000 Units  5,000 Units SubCUTAneous 3 times per day    lansoprazole (PREVACID SOLUTAB) disintegrating tablet 30 mg  30 mg Per NG tube Daily    cefTRIAXone (ROCEPHIN) 2,000 mg in sodium chloride 0.9 % 50 mL IVPB (mini-bag)  2,000 mg IntraVENous Q24H    metronidazole (FLAGYL) 500 mg in 0.9% NaCl 100 mL IVPB premix  500 mg IntraVENous Q8H    hydrALAZINE (APRESOLINE) injection 10 mg  10 mg IntraVENous Q4H PRN     sodium chloride 0.9 % bolus 100 mL  100 mL IntraVENous PRN    fentaNYL (SUBLIMAZE) injection 50 mcg  50 mcg IntraVENous Q1H PRN    balsum peru-castor oil (VENELEX) ointment   Topical BID    glucose chewable tablet 16 g  4 tablet Oral PRN    dextrose bolus 10% 125 mL  125 mL IntraVENous PRN    Or    dextrose bolus 10% 250 mL  250 mL IntraVENous PRN    glucagon (rDNA) injection 1 mg  1 mg SubCUTAneous PRN    dextrose 10 % infusion   IntraVENous Continuous PRN    sodium chloride flush 0.9 % injection 5-40 mL  5-40 mL IntraVENous 2 times per day    sodium chloride flush 0.9 % injection 5-40 mL  5-40 mL IntraVENous PRN    0.9 % sodium chloride infusion   IntraVENous PRN    acetaminophen (TYLENOL) tablet 650 mg  650 mg Oral Q6H PRN    Or    acetaminophen (TYLENOL) suppository 650 mg  650 mg Rectal Q6H PRN    ondansetron (ZOFRAN) injection 4 mg  4 mg IntraVENous Q6H PRN

## 2022-06-13 NOTE — Progress Notes (Signed)
SURGERY PROGRESS NOTE      Admit Date: 05/31/2022    POD 12 Days Post-Op    Procedure: Procedure(s):  LAPAROTOMY EXPLORATORY  LAPAROSCOPY DIAGNOSTIC  ENDOSCOPY OF ILEAL CONDUIT      Subjective:     Patient states he feels ok.  Tolerating Dobbhoff feeds.  Denies pain.  Passing flatus and having Bms.       Objective:     BP (!) 134/59   Pulse 82   Temp 97.3 F (36.3 C) (Oral)   Resp 22   Ht 1.727 m (5\' 8" )   Wt 107.7 kg (237 lb 7 oz)   SpO2 95%   BMI 36.10 kg/m      Temp (24hrs), Avg:97.8 F (36.6 C), Min:97 F (36.1 C), Max:98.4 F (36.9 C)      08/12 0701 - 08/12 1900  In: 50   Out: -   08/10 1901 - 08/12 0700  In: 750   Out: 2845 [Drains:125]    Physical Exam:    General:  alert, appears stated age, cooperative, and no distress   Abdomen: soft, bowel sounds active, non-tender    JP 5/20 cc.  One serosanguinous one cloudy    Incision:   healing well, no drainage, no erythema, no hernia, no seroma, no swelling, no dehiscence, incision well approximated         CBC:   Lab Results   Component Value Date/Time    WBC 30.3 06/12/2022 04:05 AM    RBC 3.07 06/12/2022 04:05 AM    HGB 9.1 06/12/2022 04:05 AM    HCT 26.6 06/12/2022 04:05 AM    MCV 86.6 06/12/2022 04:05 AM    MCH 29.6 06/12/2022 04:05 AM    MCHC 34.2 06/12/2022 04:05 AM    RDW 17.9 06/12/2022 04:05 AM    PLT 215 06/12/2022 04:05 AM    MPV 12.9 06/12/2022 04:05 AM     CMP:    Lab Results   Component Value Date/Time    NA 131 06/12/2022 04:05 AM    K 6.4 06/12/2022 04:05 AM    CL 102 06/12/2022 04:05 AM    CO2 20 06/12/2022 04:05 AM    BUN 93 06/12/2022 04:05 AM    CREATININE 5.76 06/12/2022 04:05 AM    LABGLOM 10 06/12/2022 04:05 AM    GLUCOSE 116 06/12/2022 04:05 AM    PROT 5.4 06/09/2022 03:42 AM    LABALBU 1.8 06/09/2022 03:42 AM    CALCIUM 7.8 06/12/2022 04:05 AM    BILITOT 9.0 06/09/2022 03:42 AM    ALKPHOS 215 06/09/2022 03:42 AM    AST 142 06/09/2022 03:42 AM    ALT 162 06/09/2022 03:42 AM       Assessment:     Principal Problem:    Gastric  perforation (HCC)  Active Problems:    Type 2 diabetes mellitus with hyperglycemia, without long-term current use of insulin (HCC)    Intestinal obstruction (HCC)    Severe sepsis (HCC)    Septic shock (HCC)    Escherichia coli septicemia (HCC)    Liver abscess    Pneumoperitoneum    Acute respiratory failure with hypoxia (HCC)    AKI (acute kidney injury) (HCC)    Multi-organ failure with heart failure (HCC)    Thrombocytopenia (HCC)    E coli bacteremia    Hepatic abscess    Peripheral arterial disease (HCC)    Hyperbilirubinemia    Gram negative sepsis (HCC)    Septicemia (HCC)  Aspiration pneumonia of both lower lobes (HCC)    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 *  Improving after severe sepsis from gram negative bacteremia and hepatic abscess     Plan:      Tube feeds as tolerated to goal.  Once cleared by speech can start PO.

## 2022-06-13 NOTE — Other (Signed)
06/13/22 1522   Vital Signs   BP (!) 122/55   Temp 98.8 F (37.1 C)   Pulse 92   Respirations 18   Pain Assessment   Pain Assessment None - Denies Pain   Pain Level 0   Post-Hemodialysis Assessment   Post-Treatment Procedures Blood returned;Catheter capped, clamped and heparinized x 2 ports   Machine Disinfection Process Acid/Vinegar Clean;Heat Disinfect;Exterior Engineer, agricultural Volume (ml) 300 ml   Blood Volume Processed (Liters) 55.3 l/min   Dialyzer Clearance Lightly streaked   Duration of Treatment (minutes) 150 minutes   Hemodialysis Intake (ml) 500 ml   Hemodialysis Output (ml) 2500 ml   NET Removed (ml) 2000   Tolerated Treatment Fair   Bilateral Breath Sounds Diminished   Edema Right lower extremity;Left lower extremity   LLE Edema +2   Facial Edema +2   Time Off 1522   Patient Disposition Return to room     Primary RN SBAR: Ladoris Gene, RN  Comments: Pt tolerated treatment fair. Hypotensive requiring Albumin. See MAR. Quinton worked well with lines reversed.

## 2022-06-14 ENCOUNTER — Inpatient Hospital Stay: Admit: 2022-06-14 | Payer: MEDICARE

## 2022-06-14 ENCOUNTER — Inpatient Hospital Stay: Payer: MEDICARE

## 2022-06-14 LAB — PROTIME-INR
INR: 1.6 — ABNORMAL HIGH (ref 0.9–1.1)
Protime: 16 s — ABNORMAL HIGH (ref 9.0–11.1)

## 2022-06-14 LAB — VAS ANKLE BRACHIAL INDEX (ABI)
Body Surface Area: 2.21 m2
Left ABI: 1.25
Left TBI: 0
Left arm BP: 111 mmHg
Left dorsalis pedis BP: 139 mmHg
Left posterior tibial: 138 mmHg
Left toe pressure: 0 mmHg
Right ABI: 1.32
Right TBI: 0
Right dorsalis pedis BP: 146 mmHg
Right posterior tibial: >240 mmHg
Right toe pressure: 0 mmHg

## 2022-06-14 LAB — COMPREHENSIVE METABOLIC PANEL
ALT: 58 U/L (ref 12–78)
AST: 107 U/L — ABNORMAL HIGH (ref 15–37)
Albumin/Globulin Ratio: 0.7 — ABNORMAL LOW (ref 1.1–2.2)
Albumin: 2.9 g/dL — ABNORMAL LOW (ref 3.5–5.0)
Alk Phosphatase: 201 U/L — ABNORMAL HIGH (ref 45–117)
Anion Gap: 11 mmol/L (ref 5–15)
BUN: 64 MG/DL — ABNORMAL HIGH (ref 6–20)
Bun/Cre Ratio: 15 (ref 12–20)
CO2: 21 mmol/L (ref 21–32)
Calcium: 8.2 MG/DL — ABNORMAL LOW (ref 8.5–10.1)
Chloride: 101 mmol/L (ref 97–108)
Creatinine: 4.23 MG/DL — ABNORMAL HIGH (ref 0.70–1.30)
Est, Glom Filt Rate: 14 mL/min/{1.73_m2} — ABNORMAL LOW (ref >60)
Globulin: 4.2 g/dL — ABNORMAL HIGH (ref 2.0–4.0)
Glucose: 153 mg/dL — ABNORMAL HIGH (ref 65–100)
Potassium: 5.3 mmol/L — ABNORMAL HIGH (ref 3.5–5.1)
Sodium: 133 mmol/L — ABNORMAL LOW (ref 136–145)
Total Bilirubin: 4.3 MG/DL — ABNORMAL HIGH (ref 0.2–1.0)
Total Protein: 7.1 g/dL (ref 6.4–8.2)

## 2022-06-14 LAB — ARTERIAL BLOOD GAS, POC
Base Deficit: 4.4 mmol/L
FIO2: 3.6 %
HCO3, Mixed: 23.3 MMOL/L (ref 22–26)
POC Allen's Test: POSITIVE
SO2c, Arterial, POC: 89.7 % — ABNORMAL LOW (ref 92–97)
pCO2, Arterial, POC: 54.3 MMHG — ABNORMAL HIGH (ref 35.0–45.0)
pH, Arterial, POC: 7.24 — CL (ref 7.35–7.45)
pO2, Arterial, POC: 69 MMHG — ABNORMAL LOW (ref 80–100)

## 2022-06-14 LAB — CBC WITH AUTO DIFFERENTIAL
Absolute Immature Granulocyte: 0.2 10*3/uL — ABNORMAL HIGH (ref 0.00–0.04)
Basophils %: 0 % (ref 0–1)
Basophils Absolute: 0 10*3/uL (ref 0.0–0.1)
Eosinophils %: 0 % (ref 0–7)
Eosinophils Absolute: 0 10*3/uL (ref 0.0–0.4)
Hematocrit: 26.8 % — ABNORMAL LOW (ref 36.6–50.3)
Hemoglobin: 8.7 g/dL — ABNORMAL LOW (ref 12.1–17.0)
Immature Granulocytes: 1 % — ABNORMAL HIGH (ref 0.0–0.5)
Lymphocytes %: 7 % — ABNORMAL LOW (ref 12–49)
Lymphocytes Absolute: 1.6 10*3/uL (ref 0.8–3.5)
MCH: 30.4 PG (ref 26.0–34.0)
MCHC: 32.5 g/dL (ref 30.0–36.5)
MCV: 93.7 FL (ref 80.0–99.0)
MPV: 12.2 FL (ref 8.9–12.9)
Monocytes %: 11 % (ref 5–13)
Monocytes Absolute: 2.5 10*3/uL — ABNORMAL HIGH (ref 0.0–1.0)
Neutrophils %: 81 % — ABNORMAL HIGH (ref 32–75)
Neutrophils Absolute: 18.7 10*3/uL — ABNORMAL HIGH (ref 1.8–8.0)
Nucleated RBCs: 0.1 PER 100 WBC — ABNORMAL HIGH
Platelets: 290 10*3/uL (ref 150–400)
RBC: 2.86 M/uL — ABNORMAL LOW (ref 4.10–5.70)
RDW: 19.7 % — ABNORMAL HIGH (ref 11.5–14.5)
WBC: 23 10*3/uL — ABNORMAL HIGH (ref 4.1–11.1)
nRBC: 0.02 10*3/uL — ABNORMAL HIGH (ref 0.00–0.01)

## 2022-06-14 LAB — HEPATIC FUNCTION PANEL
ALT: 58 U/L (ref 12–78)
AST: 112 U/L — ABNORMAL HIGH (ref 15–37)
Albumin/Globulin Ratio: 0.8 — ABNORMAL LOW (ref 1.1–2.2)
Albumin: 2.6 g/dL — ABNORMAL LOW (ref 3.5–5.0)
Alk Phosphatase: 188 U/L — ABNORMAL HIGH (ref 45–117)
Bilirubin, Direct: 3.3 MG/DL — ABNORMAL HIGH (ref 0.0–0.2)
Globulin: 3.1 g/dL (ref 2.0–4.0)
Total Bilirubin: 4 MG/DL — ABNORMAL HIGH (ref 0.2–1.0)
Total Protein: 5.7 g/dL — ABNORMAL LOW (ref 6.4–8.2)

## 2022-06-14 LAB — POCT GLUCOSE
POC Glucose: 133 mg/dL — ABNORMAL HIGH (ref 65–117)
POC Glucose: 127 mg/dL — ABNORMAL HIGH (ref 65–117)
POC Glucose: 143 mg/dL — ABNORMAL HIGH (ref 65–117)
POC Glucose: 187 mg/dL — ABNORMAL HIGH (ref 65–117)
POC Glucose: 197 mg/dL — ABNORMAL HIGH (ref 65–117)
POC Glucose: 171 mg/dL — ABNORMAL HIGH (ref 65–117)

## 2022-06-14 LAB — EXTRA TUBES HOLD

## 2022-06-14 LAB — EKG 12-LEAD
Q-T Interval: 344 ms
QRS Duration: 152 ms
QTc Calculation (Bazett): 483 ms
R Axis: -18 degrees
T Axis: 120 degrees
Ventricular Rate: 119 {beats}/min

## 2022-06-14 LAB — TROPONIN: Troponin, High Sensitivity: 39 ng/L (ref 0–76)

## 2022-06-14 LAB — BRAIN NATRIURETIC PEPTIDE
NT Pro-BNP: 7595 PG/ML — ABNORMAL HIGH (ref <450)
NT Pro-BNP: 7583 PG/ML — ABNORMAL HIGH (ref <450)

## 2022-06-14 LAB — AMMONIA: Ammonia: <10 umol/L (ref <32)

## 2022-06-14 LAB — LACTIC ACID: Lactic Acid, Plasma: 2.1 MMOL/L (ref 0.4–2.0)

## 2022-06-14 LAB — LIPASE: Lipase: 718 U/L — ABNORMAL HIGH (ref 73–393)

## 2022-06-14 MED ORDER — IOPAMIDOL 76 % IV SOLN
76 % | Freq: Once | INTRAVENOUS | Status: AC | PRN
Start: 2022-06-14 — End: 2022-06-14

## 2022-06-14 MED ORDER — PROPOFOL 1000 MG/100ML IV EMUL
1000 | INTRAVENOUS | Status: DC
Start: 2022-06-14 — End: 2022-06-16
  Administered 2022-06-14 (×2): 20 ug/kg/min via INTRAVENOUS
  Administered 2022-06-14: 23:00:00 25 ug/kg/min via INTRAVENOUS
  Administered 2022-06-15: 15:00:00 20 ug/kg/min via INTRAVENOUS
  Administered 2022-06-15: 03:00:00 25 ug/kg/min via INTRAVENOUS
  Administered 2022-06-15: 22:00:00 30 ug/kg/min via INTRAVENOUS
  Administered 2022-06-15 – 2022-06-16 (×2): 25 ug/kg/min via INTRAVENOUS
  Administered 2022-06-16: 02:00:00 30 ug/kg/min via INTRAVENOUS

## 2022-06-14 MED ORDER — ALBUMIN HUMAN 25 % IV SOLN
25 % | INTRAVENOUS | Status: AC
Start: 2022-06-14 — End: 2022-06-14
  Administered 2022-06-14: 05:00:00 25 g via INTRAVENOUS

## 2022-06-14 MED ORDER — HYDROXYZINE HCL 10 MG PO TABS
10 MG | ORAL | Status: AC
Start: 2022-06-14 — End: 2022-06-14
  Administered 2022-06-14: 04:00:00 10 mg via ORAL

## 2022-06-14 MED ORDER — NOREPINEPHRINE-SODIUM CHLORIDE 16-0.9 MG/250ML-% IV SOLN
INTRAVENOUS | Status: AC
Start: 2022-06-14 — End: 2022-06-15
  Administered 2022-06-14 (×2): 20 ug/min via INTRAVENOUS
  Administered 2022-06-15: 13 ug/min via INTRAVENOUS

## 2022-06-14 MED ORDER — MIDAZOLAM HCL (PF) 2 MG/2ML IJ SOLN
2 MG/ML | Freq: Once | INTRAMUSCULAR | Status: DC
Start: 2022-06-14 — End: 2022-06-14

## 2022-06-14 MED ORDER — VASOPRESSIN 20 UNIT/ML SOLN (MIXTURES ONLY)
20 | INTRAVENOUS | Status: DC
Start: 2022-06-14 — End: 2022-06-17

## 2022-06-14 MED ORDER — NOZIN NASAL SANITIZER 62 % NA KIT
62 | Freq: Two times a day (BID) | NASAL | Status: DC
Start: 2022-06-14 — End: 2022-06-29
  Administered 2022-06-14 – 2022-06-29 (×29): 1 via TOPICAL

## 2022-06-14 MED ORDER — IOPAMIDOL 76 % IV SOLN
76 % | INTRAVENOUS | Status: AC
Start: 2022-06-14 — End: 2022-06-14
  Administered 2022-06-14: 10:00:00 100 via INTRAVENOUS

## 2022-06-14 MED ORDER — MEROPENEM 500 MG IV SOLR
500 | Freq: Two times a day (BID) | INTRAVENOUS | Status: DC
Start: 2022-06-14 — End: 2022-06-15
  Administered 2022-06-14 – 2022-06-15 (×3): 500 mg via INTRAVENOUS

## 2022-06-14 MED ORDER — AMIODARONE HCL IN DEXTROSE 150-4.21 MG/100ML-% IV SOLN
Freq: Once | INTRAVENOUS | Status: AC
Start: 2022-06-14 — End: 2022-06-14
  Administered 2022-06-14: 05:00:00 300 mg via INTRAVENOUS

## 2022-06-14 MED FILL — DIPRIVAN 1000 MG/100ML IV EMUL: 1000 MG/100ML | INTRAVENOUS | Qty: 100

## 2022-06-14 MED FILL — ISOVUE-370 76 % IV SOLN: 76 % | INTRAVENOUS | Qty: 100

## 2022-06-14 MED FILL — METRONIDAZOLE 500 MG/100ML IV SOLN: 500 MG/100ML | INTRAVENOUS | Qty: 100

## 2022-06-14 MED FILL — HYDROXYZINE HCL 10 MG PO TABS: 10 MG | ORAL | Qty: 1

## 2022-06-14 MED FILL — HEPARIN SODIUM (PORCINE) 5000 UNIT/ML IJ SOLN: 5000 UNIT/ML | INTRAMUSCULAR | Qty: 1

## 2022-06-14 MED FILL — NOREPINEPHRINE BITARTRATE 1 MG/ML IV SOLN: 1 MG/ML | INTRAVENOUS | Qty: 16

## 2022-06-14 MED FILL — ALBUTEIN 25 % IV SOLN: 25 % | INTRAVENOUS | Qty: 100

## 2022-06-14 MED FILL — SENNA PLUS 8.6-50 MG PO TABS: ORAL | Qty: 2

## 2022-06-14 MED FILL — MIDAZOLAM HCL (PF) 2 MG/2ML IJ SOLN: 2 MG/ML | INTRAMUSCULAR | Qty: 2

## 2022-06-14 MED FILL — MEROPENEM 500 MG IV SOLR: 500 MG | INTRAVENOUS | Qty: 500

## 2022-06-14 MED FILL — VASOSTRICT 20 UNIT/ML IV SOLN: 20 UNIT/ML | INTRAVENOUS | Qty: 1

## 2022-06-14 MED FILL — NEXTERONE 150-4.21 MG/100ML-% IV SOLN: INTRAVENOUS | Qty: 200

## 2022-06-14 NOTE — Progress Notes (Signed)
Patient had an acute decline this morning.  Started on pressors, intubated and transferred to the ICU.  Has had pan CT scan.  There is peripancreatic edema, the liver abscess appears improved.  There is no clear intra-abdominal source.  Recommend supportive care and antibiotics.

## 2022-06-14 NOTE — Progress Notes (Signed)
9509: Rapid reponse called on patient. ABG drawn,patient got intubated. Went to CT then transferred to ICU.

## 2022-06-14 NOTE — Plan of Care (Signed)
Problem: Safety - Adult  Goal: Free from fall injury  06/14/2022 0957 by Don Perking, RN  Outcome: Not Progressing  06/13/2022 2243 by Blima Dessert, RN  Outcome: Progressing     Problem: Respiratory - Adult  Goal: Achieves optimal ventilation and oxygenation  06/14/2022 0957 by Don Perking, RN  Outcome: Not Progressing  Flowsheets (Taken 06/14/2022 0800)  Achieves optimal ventilation and oxygenation:   Assess for changes in respiratory status   Assess for changes in mentation and behavior   Position to facilitate oxygenation and minimize respiratory effort  06/13/2022 2243 by Blima Dessert, RN  Outcome: Progressing  Flowsheets (Taken 06/13/2022 1901)  Achieves optimal ventilation and oxygenation:   Assess for changes in respiratory status   Assess for changes in mentation and behavior   Position to facilitate oxygenation and minimize respiratory effort   Oxygen supplementation based on oxygen saturation or arterial blood gases   Initiate smoking cessation protocol as indicated   Encourage broncho-pulmonary hygiene including cough, deep breathe, incentive spirometry   Assess the need for suctioning and aspirate as needed   Assess and instruct to report shortness of breath or any respiratory difficulty   Respiratory therapy support as indicated     Problem: Chronic Conditions and Co-morbidities  Goal: Patient's chronic conditions and co-morbidity symptoms are monitored and maintained or improved  06/14/2022 0957 by Don Perking, RN  Outcome: Not Progressing  Flowsheets (Taken 06/14/2022 0800)  Care Plan - Patient's Chronic Conditions and Co-Morbidity Symptoms are Monitored and Maintained or Improved:   Monitor and assess patient's chronic conditions and comorbid symptoms for stability, deterioration, or improvement   Collaborate with multidisciplinary team to address chronic and comorbid conditions and prevent exacerbation or deterioration   Update acute care plan with appropriate goals if chronic or comorbid symptoms are  exacerbated and prevent overall improvement and discharge  06/13/2022 2243 by Blima Dessert, RN  Outcome: Progressing     Problem: Neurosensory - Adult  Goal: Achieves maximal functionality and self care  06/14/2022 0957 by Don Perking, RN  Outcome: Not Progressing  06/13/2022 2243 by Blima Dessert, RN  Outcome: Progressing  Flowsheets (Taken 06/13/2022 1901)  Achieves maximal functionality and self care:   Monitor swallowing and airway patency with patient fatigue and changes in neurological status   Encourage and assist patient to increase activity and self care with guidance from physical therapy/occupational therapy   Encourage visually impaired, hearing impaired and aphasic patients to use assistive/communication devices     Problem: Cardiovascular - Adult  Goal: Maintains optimal cardiac output and hemodynamic stability  06/14/2022 0957 by Don Perking, RN  Outcome: Not Progressing  06/13/2022 2243 by Blima Dessert, RN  Outcome: Progressing  Flowsheets (Taken 06/13/2022 1901)  Maintains optimal cardiac output and hemodynamic stability:   Monitor blood pressure and heart rate   Monitor urine output and notify Licensed Independent Practitioner for values outside of normal range   Assess for signs of decreased cardiac output   Administer fluid and/or volume expanders as ordered   Administer vasoactive medications as ordered  Goal: Absence of cardiac dysrhythmias or at baseline  06/14/2022 0957 by Don Perking, RN  Outcome: Not Progressing  06/13/2022 2243 by Blima Dessert, RN  Outcome: Progressing  Flowsheets (Taken 06/13/2022 1901)  Absence of cardiac dysrhythmias or at baseline:   Monitor cardiac rate and rhythm   Assess for signs of decreased cardiac output   Administer antiarrhythmia medication and electrolyte replacement as ordered

## 2022-06-14 NOTE — Progress Notes (Signed)
ABG done at 0740.   Results not crossing over to Epic    pH       7.44  pCO2  27  pO2    134  HCO3 18  BE       -5  O2SAT 99%

## 2022-06-14 NOTE — Consults (Signed)
Name: Jaime [name]   DOB: 04/25/1946   MRN: [id number]   Date: 06/14/2022        Chief Complaint   Patient presents with    Hypotension     Hx hypertension - BP is 70s/40s    Altered Mental Status    Nausea    Emesis         HPI:     76 y/o male ([facility] [place]) with PMHx significant of HTN, HLD and T2DM. Came in ED 7/30 noon for N/V/Abd pain and generalized weakess. Admitted for intra abdominal septic shock.  S/p ex lap that showed fluid collection in right upper quadrant.  Pt in the ICU 7/31-8/11.  Started on broad spectrum antibiotics.  BC grew enterobacter and e coli.  Being followed by ID. Hosp c/c/b episodes of V-tach and worsening AKI requiring CRRT.  Improved and downgraded to step down unit on 8/11.  Early in the morning on 8/13 found to be acutely hypotensive and unresponsive.  Last known well about 1.5 hours prior.  ICU called for input and upgrade.    Upon my arrival pt obtunded, no cough or gag.  BP 75/47.  250 cc IVF given by primary team prior to my arrival.  Started on Levophed and quickly up titrated to 20 mcg/min with improvement in BP.  Still unresponsive. ABG done=7.24/54/69. Pt still with no cough or gag despite improvement in BP.  Decision made to intubate.  Pt intubated by anesthesia without difficulty.  Will get head CT to r/o neuro event and abd CT to r/o intra abdominal infection.  Will be transferred to ICU for further manamgent.      Active Problems Being Managed:     Acute metabolic encephalopathy  Septic shock  Acute hypercapnic and hypoxic respiratory failure  AKI  Gangrene of toes    Assessment/Plan:     Acute metabolic encephalopathy- etiology sepsis vs hypercapnia vs neuro event  -HCT   -intubated for airway protection (obtunded, no cough or gag)  -send ammonia    Septic shock (intra abdominal source vs PNA)  Gangrene of toes  -S/p emergent exploratory laparotomy 06/01/22   -Vanc stopped 06/02/22.   - BC growing enterobacter and e coli 7/31  -Currently on rocephin and  flagyl,ID following    -podiatry following for gangrene  -CT chest and CT abd to look for infectious source  -c/f aspiration PNA, pt with copious endotracheal secretions post intubation  -send sputum culture  -send repeat BC  -sned procalcitonin  -s/p 250cc Ivf, holding off on further IVF due to s/o fluid overload  -titrate levophed for goal MAP>65 (currently on 30 mcg/min)  -add vasopressin    Acute hypercapnic and hypoxic respiratory failure  -ABG pre-intubation= 7.24/54/69/89.7/23  -pt intubated for airway protection given obtunded state  -CXR post intubation  -ABG post intubation and adjust vent as indicated  -propofol for vent synchrony  -daily SAT/SBT    AKI  -was getting iHD on the floor  -will need CRRT while on pressors    DVT prophylaxis: heparin sub Q  SUP: prevacid  Code status: Full    Next of kin: Kenyata Guess (spouse)    I personally spent 75 minutes of critical care time.  This is time spent at this critically ill patient's bedside actively involved in patient care as well as the coordination of care and discussions with the patient's family.  This does not include any procedural time which has been billed separately.  Review of systems:     Review of Systems   Unable to perform ROS: Mental status change          Objective:     Vital Signs:  BP (!) 98/58   Pulse (!) 106   Temp 97.5 F (36.4 C)   Resp 20   Ht 1.727 m (5\' 8" )   Wt 107.7 kg (237 lb 7 oz)   SpO2 99%   BMI 36.10 kg/m      Temp (24hrs), Avg:98 F (36.7 C), Min:97 F (36.1 C), Max:98.8 F (37.1 C)           Intake/Output:     Intake/Output Summary (Last 24 hours) at 06/14/2022 0545  Last data filed at 06/14/2022 0153  Gross per 24 hour   Intake 1130 ml   Output 2555 ml   Net -1425 ml       Physical Exam:  Physical Exam  Constitutional:       Appearance: He is ill-appearing and diaphoretic.   HENT:      Nose: Nose normal.      Mouth/Throat:      Mouth: Mucous membranes are moist.   Eyes:      Comments: Pupils unequal (Rt>L)   but reactive    Cardiovascular:      Rate and Rhythm: Tachycardia present. Rhythm irregular.      Pulses: Normal pulses.   Pulmonary:      Breath sounds: Rhonchi present.   Abdominal:      General: There is distension.      Palpations: Abdomen is soft.   Musculoskeletal:      Cervical back: Normal range of motion.      Right lower leg: Edema present.      Left lower leg: Edema present.   Skin:     General: Skin is warm.      Capillary Refill: Capillary refill takes less than 2 seconds.   Neurological:      Comments: Obtunded/unresponsive       Past Medical History:        has no past medical history on file.    Past Surgical History:      has a past surgical history that includes laparotomy (N/A, 06/01/2022); laparoscopy (N/A, 06/01/2022); Bladder surgery (N/A, 06/01/2022); and CT DRAINAGE VISCERAL PERCUTANEOUS (06/05/2022).      Home Medications:     Prior to Admission medications    Medication Sig Start Date End Date Taking? Authorizing Provider   metFORMIN (GLUCOPHAGE) 500 MG tablet Take 1 tablet by mouth daily  Patient not taking: Reported on 06/08/2022   Yes Historical Provider, MD   metoprolol (LOPRESSOR) 100 MG tablet Take 1 tablet by mouth daily   Yes Historical Provider, MD   hydrALAZINE (APRESOLINE) 25 MG tablet Take 1 tablet by mouth 3 times daily  Patient not taking: Reported on 06/08/2022   Yes Historical Provider, MD   atorvastatin (LIPITOR) 40 MG tablet Take 1 tablet by mouth daily  Patient not taking: Reported on 06/08/2022   Yes Historical Provider, MD   losartan (COZAAR) 100 MG tablet Take 1 tablet by mouth daily  Patient not taking: Reported on 06/08/2022   Yes Historical Provider, MD   metFORMIN (GLUCOPHAGE-XR) 500 MG extended release tablet Take 1 tablet by mouth daily (with breakfast)  Patient not taking: Reported on 06/08/2022 03/20/22   Historical Provider, MD   hydroCHLOROthiazide (HYDRODIURIL) 25 MG tablet TAKE 1 TABLET BY MOUTH ONCE DAILY AS NEEDED  Patient  not taking: Reported on 06/08/2022 03/17/22    Historical Provider, MD   metoprolol succinate (TOPROL XL) 100 MG extended release tablet TAKE 1 TABLET BY MOUTH ONCE DAILY WITH OR IMMEDIATELY FOLLOWING A MEAL 03/10/22   Historical Provider, MD         Allergies/Social/Family History:     Allergies   Allergen Reactions    Augmentin [Amoxicillin-Pot Clavulanate] Hives     Tolerated ceftriaxone 06/2022    Codeine      Unknown reaction      Social History     Tobacco Use    Smoking status: Not on file    Smokeless tobacco: Not on file   Substance Use Topics    Alcohol use: Not on file      No family history on file.      LABS AND  DATA:   Reviewed      Peak airway pressure:      Minute ventilation:        CRITICAL CARE CONSULTANT NOTE  I had a face to face encounter with the patient, reviewed and interpreted patient data including clinical events, labs, images, vital signs, I/O's, and examined patient.  I have discussed the case and the plan and management of the patient's care with the consulting services, the bedside nurses and the respiratory therapist.      NOTE OF PERSONAL INVOLVEMENT IN CARE   This patient has a high probability of imminent, clinically significant deterioration, which requires the highest level of preparedness to intervene urgently. I participated in the decision-making and personally managed or directed the management of the following life and organ supporting interventions that required my frequent assessment to treat or prevent imminent deterioration.        Lane Hacker, APRN - NP   Critical Care Medicine  [facility]  [phone]  06/14/2022

## 2022-06-14 NOTE — Plan of Care (Signed)
Problem: Safety - Adult  Goal: Free from fall injury  06/14/2022 1627 by Levy Sjogren, RN  Outcome: Progressing  06/14/2022 0957 by Don Perking, RN  Outcome: Not Progressing     Problem: Respiratory - Adult  Goal: Achieves optimal ventilation and oxygenation  06/14/2022 1627 by Levy Sjogren, RN  Outcome: Progressing  06/14/2022 0957 by Don Perking, RN  Outcome: Not Progressing  Flowsheets (Taken 06/14/2022 0800)  Achieves optimal ventilation and oxygenation:   Assess for changes in respiratory status   Assess for changes in mentation and behavior   Position to facilitate oxygenation and minimize respiratory effort     Problem: Chronic Conditions and Co-morbidities  Goal: Patient's chronic conditions and co-morbidity symptoms are monitored and maintained or improved  06/14/2022 1627 by Levy Sjogren, RN  Outcome: Progressing  06/14/2022 0957 by Don Perking, RN  Outcome: Not Progressing  Flowsheets (Taken 06/14/2022 0800)  Care Plan - Patient's Chronic Conditions and Co-Morbidity Symptoms are Monitored and Maintained or Improved:   Monitor and assess patient's chronic conditions and comorbid symptoms for stability, deterioration, or improvement   Collaborate with multidisciplinary team to address chronic and comorbid conditions and prevent exacerbation or deterioration   Update acute care plan with appropriate goals if chronic or comorbid symptoms are exacerbated and prevent overall improvement and discharge     Problem: Neurosensory - Adult  Goal: Achieves maximal functionality and self care  06/14/2022 1627 by Levy Sjogren, RN  Outcome: Progressing  06/14/2022 0957 by Don Perking, RN  Outcome: Not Progressing     Problem: Cardiovascular - Adult  Goal: Maintains optimal cardiac output and hemodynamic stability  06/14/2022 1627 by Levy Sjogren, RN  Outcome: Progressing  06/14/2022 0957 by Don Perking, RN  Outcome: Not Progressing  Goal: Absence of cardiac dysrhythmias or at baseline  06/14/2022 1627 by Levy Sjogren, RN  Outcome:  Progressing  06/14/2022 0957 by Don Perking, RN  Outcome: Not Progressing

## 2022-06-14 NOTE — Progress Notes (Addendum)
1105  Bedside shift change report received from [place], Charity fundraiser (offgoing nurse). Assumed care of pt at this time. Report included the following information SBAR, Kardex, Intake/Output, MAR, Recent Results, Heart rhythm and Medications. (Levo gtt @ 10 mcg/min, Propofol @20  mcg/kg/min).    1200  Assessment completed.    1600  Assessment documented. Drains emptied.    1830  Medium brown stool cleaned. Applied barrier cream.    1940  Report to [place], [place]

## 2022-06-14 NOTE — Anesthesia Procedure Notes (Signed)
Airway  Date/Time: 06/14/2022 4:50 AM  Urgency: emergent    Airway not difficult    General Information and Staff    Patient location during procedure: patient floor  Anesthesiologist: Justice Rocher, MD  Performed: anesthesiologist     Consent for Airway (if performed for an anesthetic, see related documentation for consents)  Patient identity confirmed: per hospital policy      Indications and Patient Condition  Indications for airway management: airway protection and cardiovascular instability  Spontaneous ventilation: present  Sedation level: deep  Preoxygenated: yes  Patient position: sniffing  Mask difficulty assessment: vent by bag mask    Final Airway Details  Final airway type: endotracheal airway      Successful airway: ETT  Cuffed: yes   Successful intubation technique: video laryngoscopy  Blade: Macintosh  Blade size: #4  ETT size (mm): 7.5  Cormack-Lehane Classification: grade IIa - partial view of glottis  Placement verified by: chest auscultation and capnometry   Measured from: teeth  ETT to teeth (cm): 23  Number of attempts at approach: 1      Non-anticipated difficult airway: yes

## 2022-06-14 NOTE — Progress Notes (Signed)
0030  Rhythm change noted from sinus rhythm to Afib.  NP notified.  Pt alert and verbal, responding appropriately.  EKG completed and Albumin ordered for soft BP's and Amnio bolus 300 mg given.      0210  VSS BP better but remain soft, remains Afib with HR 100 - 110.  VSS, recorded.    0345  Pt was alert, verbal and communicative at 0309 when VS were taken but at this time is barely responding.  RR called 0348.  EKG, FSBS completed.    0630 Wife updated regarding events of the RR and given the new room 2522.

## 2022-06-14 NOTE — Progress Notes (Signed)
Patient's central line was adjusted  Observe without foley  Ct abdomen suggestive for pancreatitis   Changed antibiotics to meropenem   Will continue to keep NPO   Called patient's wife and updated her  No foley at this time  Bladder scan frequently  Prognosis poor  Unclear at this time what caused the sudden deterioration  60 minutes of critical care time

## 2022-06-14 NOTE — Progress Notes (Signed)
0354  Arrived at patient's room.  Patient not responding to voice or tactile stimuli.  Per Corrie Dandy, RN patient had dialysis earlier in the day and was not as alert afterwards. This evening, his MS declined further and his BP went down.  Val, NP at bedside as well as supervisor      0400  Call made to Intensivist NP, Elmer Picker, NP, to come and evaluate patient per Milton Ferguson, NP request.  Order for Levo placed by Val, NP.  Pharmacy called to get sent to station ASAP.    0455 Pt intubated at the bedside by Anesthesia, ETT tube 7.5 placed 23 @ teeth, Levo 20 mcg started.     5035  Patient taken to CT after intubation by anesthesia and then transferred to 2522 with no issues.  VSS.    0600 Pt transferred to ICU, started on Propofol for sedation. Pt placed on vent as documented and physician orders carried out. Assessment documented, pt placed in restraints, Respiratory at bedside pt placed on Vent settings are documented. Wife was updated.     0630 central line placed by provider at bedside, PCXR confirmed placement and use.     0700 EKG obtained on patient for rhythm changed physician notified and confirmed at bedside, Afib, not interventions at this time. Advised to not start Vasopressin at this time, continue with Levo only.     1100- report given to French Ana, RN advised of all events, patients lines, tubes, drips, drains and wounds assessed together. Will continue with current treatment plan.

## 2022-06-14 NOTE — Progress Notes (Signed)
4174  Rapid response called to 2158.    0354  Arrived at patient's room.  Patient not responding to voice or tactile stimuli.  Per Corrie Dandy, RN patient had dialysis earlier in the day and was not as alert afterwards. This evening, his MS declined further and his BP went down.  Val, NP at bedside as well as Research officer, trade [employer], RN.    0400  Call made to Intensivist NP, Elmer Picker, NP, to come and evaluate patient per Milton Ferguson, NP request.  Order for Levo placed by Val, NP.  Pharmacy called to get sent to station ASAP.    [phone]  Patient taken to CT after intubation by anesthesia and then transferred to 2522 with no issues.  VSS.

## 2022-06-14 NOTE — Progress Notes (Signed)
NAME: Jaime [name]        DOB:  November 05, 1945        MRN:  [id number]                     Assessment   :                                               Plan:  AKI/ATN  Hyperkalemia  Hyponatremia   Sepsis  Hypotension>>HTN.  Resp failure  DM-2  Shock liver       CVVHD initiated 7/31 until 8/6; Right IJ quinton 8/8     iHD 8/12; no HD today - HD tomorrow (decide on modality, iHD vs CRRT, depending on hemodynamics tomorrow)    Transferred back to ICU secondary to ams/obtunded, low BP and afib - back on pressors and intubated     Watch for kidney recovery - none so far; anuric (CT on 8/13 with empty bladder and no hydro)             Subjective:     Chief Complaint:   intubated, sedated; chart reviewed  Review of Systems:    Symptom Y/N Comments  Symptom Y/N Comments   Fever/Chills    Chest Pain     Poor Appetite    Edema     Cough    Abdominal Pain     Sputum    Joint Pain     SOB/DOE    Pruritis/Rash     Nausea/vomit    Tolerating PT/OT     Diarrhea    Tolerating Diet     Constipation    Other       Could not obtain due to:      Objective:     VITALS:   Last 24hrs VS reviewed since prior progress note. Most recent are:  Vitals:    06/14/22 0611   BP:    Pulse: (!) 109   Resp: 20   Temp:    SpO2: 94%       Intake/Output Summary (Last 24 hours) at 06/14/2022 1914  Last data filed at 06/14/2022 0153  Gross per 24 hour   Intake 1130 ml   Output 2555 ml   Net -1425 ml        Telemetry Reviewed:     PHYSICAL EXAM:  General: Intubated, alert awake  +ETT  Clear  RRR  +edema- L > R  Purple toes+      Lab Data Reviewed: (see below)    Medications Reviewed: (see below)    PMH/SH reviewed - no change compared to H&P  ________________________________________________________________________  Care Plan discussed with:  Patient     Family      RN     Care Manager                    Consultant:          Comments   >50% of visit spent in counseling and coordination of  care       ________________________________________________________________________  Tanna Savoy, MD     Procedures: see electronic medical records for all procedures/Xrays and details which  were not copied into this note but were reviewed prior to creation of Plan.      LABS:  Recent Labs     06/12/22  0405 06/14/22  0245   WBC 30.3* 23.0*   HGB 9.1* 8.7*   HCT 26.6* 26.8*   PLT 215 290       Recent Labs     06/12/22  0405 06/14/22  0245   NA 131* 133*   K 6.4* 5.3*   CL 102 101   CO2 20* 21   BUN 93* 64*   MG 3.0*  --    PHOS 9.0*  --        Recent Labs     06/14/22  0245   GLOB 4.2*       MEDICATIONS:  Current Facility-Administered Medications   Medication Dose Route Frequency    norepinephrine (LEVOPHED) 16 mg in sodium chloride 0.9 % 250 mL infusion  0.5-20 mcg/min IntraVENous Continuous    midazolam PF (VERSED) injection 2 mg  2 mg IntraVENous Once    propofol infusion  5-50 mcg/kg/min IntraVENous Continuous    vasopressin 20 Units in sodium chloride 0.9 % 100 mL infusion  0.03 Units/min IntraVENous Continuous    bisacodyl (DULCOLAX) suppository 10 mg  10 mg Rectal Daily PRN    insulin lispro (HUMALOG) injection vial 0-8 Units  0-8 Units SubCUTAneous TID WC    insulin lispro (HUMALOG) injection vial 0-4 Units  0-4 Units SubCUTAneous Nightly    albumin human 25% IV solution 25 g  25 g IntraVENous PRN    amiodarone (CORDARONE) tablet 400 mg  400 mg Oral Daily    heparin (porcine) injection 1,200 Units  1,200 Units IntraCATHeter PRN    And    heparin (porcine) injection 1,000 Units  1,000 Units IntraCATHeter PRN    polyethylene glycol (GLYCOLAX) packet 17 g  17 g Oral Daily    sennosides-docusate sodium (SENOKOT-S) 8.6-50 MG tablet 2 tablet  2 tablet Oral BID    heparin (porcine) injection 5,000 Units  5,000 Units SubCUTAneous 3 times per day    lansoprazole (PREVACID SOLUTAB) disintegrating tablet 30 mg  30 mg Per NG tube Daily    cefTRIAXone (ROCEPHIN) 2,000 mg in sodium chloride 0.9 % 50 mL IVPB (mini-bag)   2,000 mg IntraVENous Q24H    metronidazole (FLAGYL) 500 mg in 0.9% NaCl 100 mL IVPB premix  500 mg IntraVENous Q8H    hydrALAZINE (APRESOLINE) injection 10 mg  10 mg IntraVENous Q4H PRN    sodium chloride 0.9 % bolus 100 mL  100 mL IntraVENous PRN    fentaNYL (SUBLIMAZE) injection 50 mcg  50 mcg IntraVENous Q1H PRN    balsum peru-castor oil (VENELEX) ointment   Topical BID    glucose chewable tablet 16 g  4 tablet Oral PRN    dextrose bolus 10% 125 mL  125 mL IntraVENous PRN    Or    dextrose bolus 10% 250 mL  250 mL IntraVENous PRN    glucagon (rDNA) injection 1 mg  1 mg SubCUTAneous PRN    dextrose 10 % infusion   IntraVENous Continuous PRN    sodium chloride flush 0.9 % injection 5-40 mL  5-40 mL IntraVENous 2 times per day    sodium chloride flush 0.9 % injection 5-40 mL  5-40 mL IntraVENous PRN    0.9 % sodium chloride infusion   IntraVENous PRN    acetaminophen (TYLENOL) tablet 650 mg  650 mg Oral Q6H PRN    Or    acetaminophen (TYLENOL) suppository 650 mg  650 mg Rectal Q6H PRN    ondansetron (ZOFRAN) injection 4 mg  4 mg IntraVENous Q6H PRN

## 2022-06-14 NOTE — Procedures (Signed)
[facility]      Procedure Note - Central Venous Access:   Performed by Lane Hacker, APRN - NP    Obtained emergent Consent.    Immediately prior to the procedure, the patient was reevaluated and found suitable for the planned procedure and any planned medications.  Immediately prior to the procedure a time out was called to verify the correct patient, procedure, equipment, staff, and marking as appropriate.    Diagnosis: Septic shock    Central line Bundle:  Full sterile barrier precautions used.  7-Step Sterility Protocol followed.  (cap, mask sterile gown, sterile gloves, large sterile sheet, hand hygiene, 2% chlorhexidine for cutaneous antisepsis)  5 mL 1% Lidocaine placed at insertion site.      Patient positioned in Trendelenburg?yes   The site was prepped with ChloraPrep.   Using Seldinger technique a Triple Lumen CVC was placed in the Left, Internal Jugular Vein via direct cannulation with 1 number of attempts for IV Access.    Ultrasound Guidance was utilized.    There was good dark, non-pulsatile blood return in all ports.  Catheter secured. Biopatch in place? yes. Sterile Bio-occlusive dressing placed.   The following complications were encountered: None.  A follow-up chest x-ray was ordered post procedure.    The procedure was tolerated well.      Lane Hacker, APRN - NP  Critical Care Medicine  [facility]

## 2022-06-14 NOTE — Significant Event (Signed)
RAPID RESPONSE NOTE    Rapid response called overhead at for hypotension, arrhythmia and AMS. Provider to bedside. Pt briefly arousable with strong sternal rub; BP 60s/40s. Earlier tonight pt converted to atrial fibrillation from NSR w/ HR 119. EKG, CXR, troponin, BNP, lactate, ammonia, ABGs ordered. CBC/BMP were drawn 30 minutes earlier. Pt now obtunded; BP 70s/40s; Intensivist to bedside; levophed started at 5 mcg/min and titrated up to 20 mcg/min; pt remained unresponsive to tactile stimuli; anesthesia called; pt was intubated then taken for head CT to r/o neuro etiology and abdominal CT to r/o abdominal infection. Pt transferred to ICU for further medical management.

## 2022-06-15 LAB — BASIC METABOLIC PANEL
Anion Gap: 12 mmol/L (ref 5–15)
BUN: 86 MG/DL — ABNORMAL HIGH (ref 6–20)
Bun/Cre Ratio: 15 (ref 12–20)
CO2: 20 mmol/L — ABNORMAL LOW (ref 21–32)
Calcium: 7.9 MG/DL — ABNORMAL LOW (ref 8.5–10.1)
Chloride: 100 mmol/L (ref 97–108)
Creatinine: 5.65 MG/DL — ABNORMAL HIGH (ref 0.70–1.30)
Est, Glom Filt Rate: 10 mL/min/{1.73_m2} — ABNORMAL LOW (ref >60)
Glucose: 171 mg/dL — ABNORMAL HIGH (ref 65–100)
Potassium: 5.6 mmol/L — ABNORMAL HIGH (ref 3.5–5.1)
Sodium: 132 mmol/L — ABNORMAL LOW (ref 136–145)

## 2022-06-15 LAB — BLOOD GAS, ARTERIAL
Base deficit, arterial blood: 4.7 mmol/L
FIO2 Arterial: 40 %
HCO3, Arterial: 18 mmol/L — ABNORMAL LOW (ref 22–26)
POC O2 SAT: 99 % — ABNORMAL HIGH (ref 92–97)
Set Rate, POC: 20
pCO2, Arterial: 27 mmHg — ABNORMAL LOW (ref 35–45)
pH, Arterial: 7.44 (ref 7.35–7.45)
pO2, Arterial: 134 mmHg — ABNORMAL HIGH (ref 80–100)

## 2022-06-15 LAB — CBC WITH AUTO DIFFERENTIAL
Absolute Immature Granulocyte: 0.4 10*3/uL — ABNORMAL HIGH (ref 0.00–0.04)
Basophils %: 0 % (ref 0–1)
Basophils Absolute: 0 10*3/uL (ref 0.0–0.1)
Eosinophils %: 0 % (ref 0–7)
Eosinophils Absolute: 0 10*3/uL (ref 0.0–0.4)
Hematocrit: 24.3 % — ABNORMAL LOW (ref 36.6–50.3)
Hemoglobin: 8 g/dL — ABNORMAL LOW (ref 12.1–17.0)
Immature Granulocytes: 2 % — ABNORMAL HIGH (ref 0.0–0.5)
Lymphocytes %: 10 % — ABNORMAL LOW (ref 12–49)
Lymphocytes Absolute: 2.2 10*3/uL (ref 0.8–3.5)
MCH: 30.3 PG (ref 26.0–34.0)
MCHC: 32.9 g/dL (ref 30.0–36.5)
MCV: 92 FL (ref 80.0–99.0)
MPV: 11.3 FL (ref 8.9–12.9)
Monocytes %: 11 % (ref 5–13)
Monocytes Absolute: 2.4 10*3/uL — ABNORMAL HIGH (ref 0.0–1.0)
Neutrophils %: 77 % — ABNORMAL HIGH (ref 32–75)
Neutrophils Absolute: 16.7 10*3/uL — ABNORMAL HIGH (ref 1.8–8.0)
Nucleated RBCs: 0.1 PER 100 WBC — ABNORMAL HIGH
Platelets: 270 10*3/uL (ref 150–400)
RBC: 2.64 M/uL — ABNORMAL LOW (ref 4.10–5.70)
RDW: 19.4 % — ABNORMAL HIGH (ref 11.5–14.5)
WBC: 21.7 10*3/uL — ABNORMAL HIGH (ref 4.1–11.1)
nRBC: 0.02 10*3/uL — ABNORMAL HIGH (ref 0.00–0.01)

## 2022-06-15 LAB — POCT GLUCOSE
POC Glucose: 195 mg/dL — ABNORMAL HIGH (ref 65–117)
POC Glucose: 182 mg/dL — ABNORMAL HIGH (ref 65–117)
POC Glucose: 140 mg/dL — ABNORMAL HIGH (ref 65–117)

## 2022-06-15 LAB — PHOSPHORUS: Phosphorus: 8.6 MG/DL — ABNORMAL HIGH (ref 2.6–4.7)

## 2022-06-15 LAB — MAGNESIUM: Magnesium: 2.9 mg/dL — ABNORMAL HIGH (ref 1.6–2.4)

## 2022-06-15 MED ORDER — SODIUM CHLORIDE 0.9 % IV BOLUS
0.9 % | Freq: Once | INTRAVENOUS | Status: AC
Start: 2022-06-15 — End: 2022-06-15
  Administered 2022-06-15: 13:00:00 250 mL via INTRAVENOUS

## 2022-06-15 MED ORDER — NOREPINEPHRINE-SODIUM CHLORIDE 16-0.9 MG/250ML-% IV SOLN
16-0.9250- MG/250ML-% | INTRAVENOUS | Status: AC
Start: 2022-06-15 — End: 2022-06-20
  Administered 2022-06-15: 09:00:00 10 ug/min via INTRAVENOUS
  Administered 2022-06-16: 13:00:00 9 ug/min via INTRAVENOUS
  Administered 2022-06-16: 14:00:00 6 ug/min via INTRAVENOUS
  Administered 2022-06-16 – 2022-06-17 (×2): 13 ug/min via INTRAVENOUS
  Administered 2022-06-18: 05:00:00 6 ug/min via INTRAVENOUS

## 2022-06-15 MED ORDER — MEROPENEM 1 G IV SOLR
1 | Freq: Two times a day (BID) | INTRAVENOUS | Status: DC
Start: 2022-06-15 — End: 2022-06-17
  Administered 2022-06-16 – 2022-06-17 (×3): 1000 mg via INTRAVENOUS

## 2022-06-15 MED ORDER — ALIGN PO CAPS
Freq: Every day | ORAL | Status: AC
Start: 2022-06-15 — End: 2022-06-26
  Administered 2022-06-15 – 2022-06-25 (×11): 1 via ORAL

## 2022-06-15 MED ORDER — PRISMASOL BGK 2/3.5 DIALYSIS SOLUTION
32-2-3.5 | Status: DC
Start: 2022-06-15 — End: 2022-06-18
  Administered 2022-06-15 – 2022-06-18 (×36): via INTRAVENOUS_CENTRAL

## 2022-06-15 MED FILL — BPCO EX OINT: CUTANEOUS | Qty: 60

## 2022-06-15 MED FILL — AMIODARONE HCL 200 MG PO TABS: 200 MG | ORAL | Qty: 2

## 2022-06-15 MED FILL — HEPARIN SODIUM (PORCINE) 5000 UNIT/ML IJ SOLN: 5000 UNIT/ML | INTRAMUSCULAR | Qty: 1

## 2022-06-15 MED FILL — DIPRIVAN 1000 MG/100ML IV EMUL: 1000 MG/100ML | INTRAVENOUS | Qty: 100

## 2022-06-15 MED FILL — RISAQUAD-2 PO CAPS: ORAL | Qty: 1

## 2022-06-15 MED FILL — ACETAMINOPHEN 325 MG PO TABS: 325 MG | ORAL | Qty: 2

## 2022-06-15 MED FILL — METRONIDAZOLE 500 MG/100ML IV SOLN: 500 MG/100ML | INTRAVENOUS | Qty: 100

## 2022-06-15 MED FILL — MEROPENEM 500 MG IV SOLR: 500 MG | INTRAVENOUS | Qty: 500

## 2022-06-15 MED FILL — HEALTHYLAX 17 G PO PACK: 17 g | ORAL | Qty: 1

## 2022-06-15 MED FILL — SENNA PLUS 8.6-50 MG PO TABS: ORAL | Qty: 2

## 2022-06-15 MED FILL — LANSOPRAZOLE 30 MG PO TBDD: 30 MG | ORAL | Qty: 1

## 2022-06-15 NOTE — Progress Notes (Signed)
Name: Jaime [name]   MRN: [id number]  DOB: 1946/01/16  06/15/2022 9:32 AM      Admit Date: 05/31/2022    Admit Diagnosis: Septicemia (HCC) [A41.9]  Gastric perforation (HCC) [K25.5]  Perforated abdominal viscus [R19.8]    Assessment/Plan:     Principal Problem:    Gastric perforation (HCC)  Active Problems:    Type 2 diabetes mellitus with hyperglycemia, without long-term current use of insulin (HCC)    Intestinal obstruction (HCC)    Severe sepsis (HCC)    Septic shock (HCC)    Escherichia coli septicemia (HCC)    Liver abscess    Pneumoperitoneum    Acute respiratory failure with hypoxia (HCC)    AKI (acute kidney injury) (HCC)    Multi-organ failure with heart failure (HCC)    Thrombocytopenia (HCC)    E coli bacteremia    Hepatic abscess    Peripheral arterial disease (HCC)    Hyperbilirubinemia    Gram negative sepsis (HCC)    Septicemia (HCC)    Aspiration pneumonia of both lower lobes (HCC)    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 *          06/15/22     Assessment:         AKI/ATN- anuric- Grade 3  Hyperkalemia  Resp failure Vent Dependent  Septic shock  Hyponatremia   Encephalopathy  Acute abdomen from abdominal viscus perforation    Liver abscess  S/p emergent exploratory laparotomy 06/01/22    Type 2 DM  Shock liver      Discussion/MDM: Patient with multiple medical comorbidities, each with high likelihood for morbidity and mortality if left untreated.  Patient being treated with medication that requires intensive monitoring due to increased risk for toxicity. I have reviewed patient's presenting subjective and objective findings, as well as all laboratory studies, imaging studies, and vital signs to date as well as treatment rendered and patient's response to those treatments.  In addition, prior medical, surgical and relevant social and family histories were  reviewed.        Discussion:     CVVHD initiated 7/31 until 8/6; Right IJ [place] 8/8    iHD 8/12  Currently in shock and pressor dependent  Severe PAD with gangrene of toes  Worsening hyperkalemia with anuria    Plan:     Initiate CVVHDF  QB 200  No fluid removal  Dose 25 ml/kg/hr  Use 2 K dialysate   [facility] notified.    Bryan Lemma, MD    Dr Dorthey Sawyer  Cell no- [phone]  Available on perfect serve.      Signed By: Bryan Lemma, MD     June 15, 2022         Subjective:   Hospital day 14   Intubated and sedated. Clinical events discussed with RN  On trickle feeds today  BM yesterday  Review of Systems:  Review of systems not obtained due to patient factors.   Objective:    BP (!) 115/48   Pulse 63   Temp 98.4 F (36.9 C) (Axillary)   Resp 20   Ht 1.727 m (5\' 8" )   Wt 107.7 kg (237 lb 7 oz)   SpO2 95%   BMI 36.10 kg/m     Physical Exam:  Physical Exam  Constitutional:       Appearance: He is ill-appearing.   HENT:  Head: Atraumatic.      Nose: No congestion or rhinorrhea.   Cardiovascular:      Rate and Rhythm: Normal rate and regular rhythm.   Pulmonary:      Comments: Ventilator dependent  Abdominal:      General: There is distension.   Skin:     Findings: Bruising and lesion present.   Neurological:      Mental Status: He is disoriented.      Access- R IJ Dialysis access  L ij tLC  Peripheral gangrene noted in some toes.  Recent Results (from the past 24 hour(s))   POCT Glucose    Collection Time: 06/14/22 12:19 PM   Result Value Ref Range    POC Glucose 197 (H) 65 - 117 mg/dL    Performed by: Levy Sjogren    POCT Glucose    Collection Time: 06/14/22  5:18 PM   Result Value Ref Range    POC Glucose 171 (H) 65 - 117 mg/dL    Performed by: Levy Sjogren    POCT Glucose    Collection Time: 06/14/22  8:30 PM   Result Value Ref Range    POC Glucose 195 (H) 65 - 117 mg/dL    Performed by: Arthur Holms    Phosphorus    Collection Time: 06/15/22  4:03 AM   Result Value Ref Range     Phosphorus 8.6 (H) 2.6 - 4.7 MG/DL   Magnesium    Collection Time: 06/15/22  4:03 AM   Result Value Ref Range    Magnesium 2.9 (H) 1.6 - 2.4 mg/dL   Basic Metabolic Panel    Collection Time: 06/15/22  4:03 AM   Result Value Ref Range    Sodium 132 (L) 136 - 145 mmol/L    Potassium 5.6 (H) 3.5 - 5.1 mmol/L    Chloride 100 97 - 108 mmol/L    CO2 20 (L) 21 - 32 mmol/L    Anion Gap 12 5 - 15 mmol/L    Glucose 171 (H) 65 - 100 mg/dL    BUN 86 (H) 6 - 20 MG/DL    Creatinine 3.47 (H) 0.70 - 1.30 MG/DL    Bun/Cre Ratio 15 12 - 20      Est, Glom Filt Rate 10 (L) >60 ml/min/1.15m2    Calcium 7.9 (L) 8.5 - 10.1 MG/DL   CBC with Auto Differential    Collection Time: 06/15/22  4:03 AM   Result Value Ref Range    WBC 21.7 (H) 4.1 - 11.1 K/uL    RBC 2.64 (L) 4.10 - 5.70 M/uL    Hemoglobin 8.0 (L) 12.1 - 17.0 g/dL    Hematocrit 42.5 (L) 36.6 - 50.3 %    MCV 92.0 80.0 - 99.0 FL    MCH 30.3 26.0 - 34.0 PG    MCHC 32.9 30.0 - 36.5 g/dL    RDW 95.6 (H) 38.7 - 14.5 %    Platelets 270 150 - 400 K/uL    MPV 11.3 8.9 - 12.9 FL    Nucleated RBCs 0.1 (H) 0 PER 100 WBC    nRBC 0.02 (H) 0.00 - 0.01 K/uL    Neutrophils % 77 (H) 32 - 75 %    Lymphocytes % 10 (L) 12 - 49 %    Monocytes % 11 5 - 13 %    Eosinophils % 0 0 - 7 %    Basophils % 0 0 - 1 %    Immature Granulocytes 2 (  H) 0.0 - 0.5 %    Neutrophils Absolute 16.7 (H) 1.8 - 8.0 K/UL    Lymphocytes Absolute 2.2 0.8 - 3.5 K/UL    Monocytes Absolute 2.4 (H) 0.0 - 1.0 K/UL    Eosinophils Absolute 0.0 0.0 - 0.4 K/UL    Basophils Absolute 0.0 0.0 - 0.1 K/UL    Absolute Immature Granulocyte 0.4 (H) 0.00 - 0.04 K/UL    Differential Type SMEAR SCANNED      RBC Comment ANISOCYTOSIS  1+        RBC Comment TEARDROP CELLS  1+       POCT Glucose    Collection Time: 06/15/22  8:33 AM   Result Value Ref Range    POC Glucose 182 (H) 65 - 117 mg/dL    Performed by: Vernell Leep PCT           Intake/Output Summary (Last 24 hours) at 06/15/2022 0932  Last data filed at 06/15/2022 0537  Gross per 24 hour   Intake  672.08 ml   Output 71 ml   Net 601.08 ml          Data Review:   Recent Labs     06/14/22  0646 06/15/22  0403   NA  --  132*   K  --  5.6*   BUN  --  86*   CREATININE  --  5.65*   WBC  --  21.7*   HGB  --  8.0*   HCT  --  24.3*   PLT  --  270   INR 1.6*  --        No current facility-administered medications on file prior to encounter.     Current Outpatient Medications on File Prior to Encounter   Medication Sig Dispense Refill    metFORMIN (GLUCOPHAGE) 500 MG tablet Take 1 tablet by mouth daily (Patient not taking: Reported on 06/08/2022)      metoprolol (LOPRESSOR) 100 MG tablet Take 1 tablet by mouth daily      hydrALAZINE (APRESOLINE) 25 MG tablet Take 1 tablet by mouth 3 times daily (Patient not taking: Reported on 06/08/2022)      atorvastatin (LIPITOR) 40 MG tablet Take 1 tablet by mouth daily (Patient not taking: Reported on 06/08/2022)      losartan (COZAAR) 100 MG tablet Take 1 tablet by mouth daily (Patient not taking: Reported on 06/08/2022)      metFORMIN (GLUCOPHAGE-XR) 500 MG extended release tablet Take 1 tablet by mouth daily (with breakfast) (Patient not taking: Reported on 06/08/2022)      hydroCHLOROthiazide (HYDRODIURIL) 25 MG tablet TAKE 1 TABLET BY MOUTH ONCE DAILY AS NEEDED (Patient not taking: Reported on 06/08/2022)      metoprolol succinate (TOPROL XL) 100 MG extended release tablet TAKE 1 TABLET BY MOUTH ONCE DAILY WITH OR IMMEDIATELY FOLLOWING A MEAL          Care Plan discussed with:  Patient     Family      RN x    Care Manager        Hospitalist    Consultant:            Comments   >50% of visit spent in counseling and coordination of care Y          Signed By: Bryan Lemma, MD     June 15, 2022       This dictation was done by dragon, Advertising account planner.  Often unanticipated  grammatical, syntax, phones and other interpretive errors are inadvertently transcribed.  Please excuse errors that have escaped final proofreading. Please contact me if you suspect dictation or  transcription errors.      Dr Dorthey Sawyer    Office No- [phone]  [facility]  [address]  [address]  [place], [place] [address]    Fax: [phone]

## 2022-06-15 NOTE — Care Coordination-Inpatient (Signed)
Transition of Care Plan:    RUR: 18%  Prior Level of Functioning:  Independent - lives with wife in [place]  Disposition: TBD  If SNF or IPR: Date FOC offered:   Date FOC received:   Accepting facility:   Date authorization started with reference number:   Date authorization received and expires:   Follow up appointments: PCP/Specialist  DME needed: TBD  Transportation at discharge: family if appropriate  IM/IMM Medicare/Tricare letter given: will need 2nd IMM prior to discharge  Is patient a Veteran and connected with VA? yes   If yes, was [employer] transfer form completed and VA notified?   Caregiver Contact: Masen Luallen wife [phone]  Discharge Caregiver contacted prior to discharge? Per patient when able  Care Conference needed?   Barriers to discharge: medical stabiltiy      CM informed by [facility] with [facility] [phone]- informed she has received updates/medicals on patient - CM had also provided listing of IPR vs SNF to patient - patient tx to CCU over weekend and now reintubated on 06/14/2022.  Will continue to assist with transitions of care as needs identified.  Ella Jubilee, MSW  [phone]

## 2022-06-15 NOTE — Progress Notes (Signed)
Palliative Medicine SW Note    This writer checked in on patient, who was sleeping soundly, so did not disturb. Will check on him tomorrow to offer support.    Thank you for including Palliative team in the care of Mr. Jaime [name].    Natalia Leatherwood, LCSW  Palliative Medicine [phone])

## 2022-06-15 NOTE — Progress Notes (Signed)
Speech Pathology Note    Chart reviewed. Patient currently intubated/sedated following RRT on 8/13. Will complete SLP orders at this time. Please re-consult when patient extubated and medically stable. Thank you.    Katy Apo, M.S., CCC-SLP

## 2022-06-15 NOTE — Progress Notes (Signed)
Chart reviewed, weekend events noted. Pt with sudden decline in medical status and is currently intubated, sedated, and requiring CVVHD. Will defer PT intervention however continue to follow. Thank you    Demetrio Lapping, PT, DPT

## 2022-06-15 NOTE — Progress Notes (Signed)
Occupational Therapy    Chart reviewed. Pt currently intubated/sedated following RR and acute decline on 06/14/2022. Will defer and continue to follow.

## 2022-06-15 NOTE — Interdisciplinary (Signed)
Interdisciplinary team rounds were held 06/15/2022 with the following team members:Intensivist, CCL, Charge RN, Primary RN, Dietitian, Pharmacy, DM Management, and CM .      Plan of care discussed. See clinical pathway and/or care plan for interventions and desired outcomes.     Goals of the Day:  - Start CVVH today  - TF as tolerated

## 2022-06-15 NOTE — Other (Signed)
[name]  [facility]  DIABETES MANAGEMENT CONSULT    Consulted by Provider for advanced nursing evaluation and care for inpatient blood glucose management.    Evaluation and Action Plan   This 76 year old Caucasian male was admitted with RUQ pain and underwent laparotomy 06/01/22. Was in septic shock with multi-organ failure. Several episodes of Vtach. Remained on vent support, pressors & CRRT. A new finding of liver abscess noted on CT; drain placed & now with less output. There is concern for LLE viability due to diminished pedal pulses. Ischemia of first 2 toes bilaterally. Per vascular, he has not been a candidate for endovascular or surgical revascularization at this time. Moved out of ICU but experienced an acute decline (hypotension, arrhythmia & AMS) 06/14/22 & was returned to ICU. Again intubated & sedated and on pressor support. Will be dialyzed today.    As for BG management in this patient with known Type 2 diabetes, began low dose basal insulin 06/02/22 along with corrective insulin to address impact of steroid. Bgs had been 170-180s but continued to rise. Insulin adjusted on 06/05/22. HC stopped 06/07/22. Bgs in target. High residuals resulted in TF being stopped; hence, did not required any insulin yesterday 06/11/22. Has not passed swallow test again today. Will use corrective approach until TF back in place.    Management Rationale Action Plan   Medication   Overriding steroid effect     As TF reaches goal rate of 40cc/hr (209 CHO/D), will need 20 units of total insulin/D - either 20 units of Lantus daily or 10 units of NPH twice daily   Corrective insulin approach       Additional orders               Initial Presentation   Jaime [name] is a 76 y.o. male admitted 05/31/22 from ER after experiencing RUQ pain associated with generalized weakness, nausea & vomiting for several days PTA. AMS+. Hypotension+. Fever+. Dyspneic+. He is visiting from [place].  Ectopic atrial  tachycardia  ER exam:  Cardiovascular:      Rate and Rhythm: Regular rhythm. Tachycardia present.   Pulmonary:      Comments: He is tachypneic, coarse breath sounds on inspiration at bilateral bases  Abdominal:      Palpations: Abdomen is soft.      Comments: Tender to palpation diffusely in the right sided abdomen without guarding or rebound tenderness   LAB: WBC 16.6. Normal H&H. Low platelets.BG 272/AG 17. AKI. Elevated liver enzymes. NT pro-BNP 20,980. Lipase >3000.     CXR:   Right IJ catheter satisfactory position without pneumothorax. ET   tube and NG tube as above   CT Head: Negative  CT ABd:   Extraluminal air bubbles in the right upper quadrant concerning for gastric   perforation. Inflammatory changes are noted about the tail of the pancreas   extending into the left anterior pararenal space, please correlate with any   evidence of acute pancreatitis. Bilateral lower lobe infiltrates.     EH:MCNOBSJ reviewed. No pertinent past medical history.     INITIAL DX: Septicemia (HCC) [A41.9]  Gastric perforation (HCC) [K25.5]  Perforated abdominal viscus [R19.8]     Current Treatment     TX: 06/01/22 LAPAROTOMY EXPLORATORY  LAPAROSCOPY DIAGNOSTIC ENDOSCOPY OF ILEAL CONDUIT  EGD    Hospital Course   Clinical progress has been complicated by multi-organ failure.   06/01/22 Dialysis  06/02/22 In septic shock with multi-organ failure. On vent  support, sedation, pressor support and bicarb infusion. On Abx. Two runs of VT this morning requiring defibrillation; cardiology conversation anticipated. CRRT+  06/03/22 Remains on vent support C vent Fi02 50%/Peep 7. Low grade fever; on Abx. Steroids+. Afib/bradycardic. Pressors+. CRRT+.   06/04/22 Remains on vent support and pressors. On Abx. Concern for LLE. CRRT. CT: Enlarged left hepatic abscess contain gas with other areas of probable abscess/phlegmonous infection.  06/05/22 On AC vent support. On Amio, vaso, levo & Fentanyl infusions. OG draining green fluid. CRRT+.  Vascular:  Patient remains at risk for progressive deterioration and limb loss but needs to be weaned from pressor support before intervention is safe.  06/08/22 On Spon vent support; plans to extubate after CT today. Fentanyl pushes. H&H low. CRRT+. Mottled & blue toes bilaterally. Vascular AKI study to be completed  06/09/22 Alert. 02NC. Amio infusion+. Pedal pulses returning (no need to use doppler)  06/10/22 Alert. 02NC. NSR. Off pressors. TF running. Swallow eval today => may begin CL. Undergoing hemodialysis now. Continued cyanosis of bilateral toes.  06/11/22 Alert & conversational today  06/12/22 A&O. Failed swallow; allowed ice chips. Podiatry to see for LE ischemia  06/15/22 Intubated & sedated on pressor support. TF restarted at trickle rate. Dialysis planned per nephrology.    Diabetes History   Type 2 diabetes on metformin therapy  Checks Bgs and they are in the 100s  Is physically active    Diabetes-related Medical History deferred    Diabetes Medication History - based on PTA list  Drug class Currently in use   Biguanide [x]  Metformin (Glucophage)  []  Metformin ER (Glucophage XL)     Diabetes self-management practices: deferred  Overall evaluation:    [x]  Unknown A1c     Subjective   NA     Objective   Physical exam  General Obese male who is ill-appearing  Neuro  Sedated  Vital Signs   Vitals:    06/15/22 0757   BP:    Pulse: 63   Resp: 20   Temp:    SpO2: 95%     Extremities    Diabetic foot exam:    Left Foot     Visual Exam: Cool & dry. Thickened toenails. 3 toes gangrenous    Pulse DP: +1   Right Foot   Visual Exam: Cool & dry. Thickened toenails . 4 toes gangrenous   Pulse DP: +1      Laboratory  Recent Labs     06/14/22  0245 06/15/22  0403   WBC 23.0* 21.7*   HGB 8.7* 8.0*   HCT 26.8* 24.3*   MCV 93.7 92.0   PLT 290 270       Recent Labs     06/14/22  0245 06/15/22  0403   NA 133* 132*   K 5.3* 5.6*   CL 101 100   CO2 21 20*   PHOS  --  8.6*   BUN 64* 86*   CREATININE 4.23* 5.65*       Lab Results   Component  Value Date    ALT 58 06/14/2022    AST 112 (H) 06/14/2022    ALKPHOS 188 (H) 06/14/2022    BILITOT 4.0 (H) 06/14/2022     No results found for: TSH, TSHREFLEX, TSHFT4, TSHELE, TSH3GEN, TSHHS   Lab Results   Component Value Date    LABA1C 7.7 (H) 06/02/2022     Factors impacting BG management  Factor Dose Comments   Nutrition:  TF  At trickle rate   Pain Fent PRN    Infection Merrem Q12 hrs WBC remains elevated   Other:   Kidney function  Liver function   AKI  Liver enzymes elevated      Blood glucose pattern      Significant diabetes-related events  05/31/22 Admission BG 233  06/01/22 Dex 4mg  => BG 100s => HC started  06/02/22 Bgs into 200s  06/03/22 Bgs in 170-180s  06/04/22 Bgs rising abit. HC reduced frequency today. Trickle feeds to begin  06/05/22 Bgs rising into 200s  06/08/22 Bgs in target  06/09/22 Bgs rising with start of TF at higher rate  06/10/22 Bgs rose abit with TF @30cc /hr  06/11/22 Bgs 108, 113. No insulin given  06/12/22 No insulin given. No nutrition. AKI  06/14/22 Acute decline in health status after dialysis => back to ICU  06/15/22 TF restarted    Assessment and Nursing Intervention   Nursing Diagnosis Risk for unstable blood glucose pattern   Nursing Intervention Domain 5250 Decision-making Support   Nursing Interventions Examined current inpatient diabetes/blood glucose control   Explored factors facilitating and impeding inpatient management  Explored corrective strategies with patient and responsible inpatient provider   Informed patient of rational for insulin strategy while hospitalized     Billing Code(s)   []  99233 IP subsequent hospital care - 50 minutes   [x]  99232 IP subsequent hospital care - 35 minutes   []  99231 IP subsequent hospital care - 25 minutes   []  99221 IP initial hospital care - 40 minutes     Before making these care recommendations, I personally reviewed the hospitalization record, including notes, laboratory & diagnostic data and current medications, and examined the patient at the  bedside (circumstances permitting) before determining care. More than fifty (50) percent of the time was spent in patient counseling and/or care coordination.  Total minutes: 35    06/16/22, APRN - CNS  Clinical Nurse Specialist - Diabetes & endocrine disorders  [facility]  Access via Perfect Serve

## 2022-06-15 NOTE — Progress Notes (Signed)
[facility]    ICU TEAM Progress Note    Name: Jaime [name]   DOB: 11-23-1945   MRN: [id number]   Date: 06/15/2022        Subjective:     Reason for ICU Admission: respiratory failure     HPI: 76 y/o male ([facility] [place]) with PMHx significant of HTN, HLD and T2DM. Came in ED 7/30 noon for N/V/Abd pain and generalized weakess. Admitted for intra abdominal septic shock.  S/p ex lap that showed fluid collection in right upper quadrant.  Pt in the ICU 7/31-8/11.  Started on broad spectrum antibiotics.  BC grew enterobacter and e coli.  Being followed by ID. Hosp c/c/b episodes of V-tach and worsening AKI requiring CRRT.  Improved and downgraded to step down unit on 8/11.  Early in the morning on 8/13 found to be acutely hypotensive and unresponsive  Patient was transferred to ICU and intubated    8/14: pressor requirement is slowly declining  Remains intubated and sedated   Now on fio2 40%    Past Medical History:      has no past medical history on file.    Past Surgical History:      has a past surgical history that includes laparotomy (N/A, 06/01/2022); laparoscopy (N/A, 06/01/2022); Bladder surgery (N/A, 06/01/2022); and CT DRAINAGE VISCERAL PERCUTANEOUS (06/05/2022).    Home Medications:     Prior to Admission medications    Medication Sig Start Date End Date Taking? Authorizing Provider   metFORMIN (GLUCOPHAGE) 500 MG tablet Take 1 tablet by mouth daily  Patient not taking: Reported on 06/08/2022   Yes Historical Provider, MD   metoprolol (LOPRESSOR) 100 MG tablet Take 1 tablet by mouth daily   Yes Historical Provider, MD   hydrALAZINE (APRESOLINE) 25 MG tablet Take 1 tablet by mouth 3 times daily  Patient not taking: Reported on 06/08/2022   Yes Historical Provider, MD   atorvastatin (LIPITOR) 40 MG tablet Take 1 tablet by mouth daily  Patient not taking: Reported on 06/08/2022   Yes Historical Provider, MD   losartan (COZAAR) 100 MG tablet Take 1 tablet by mouth daily  Patient not taking: Reported on  06/08/2022   Yes Historical Provider, MD   metFORMIN (GLUCOPHAGE-XR) 500 MG extended release tablet Take 1 tablet by mouth daily (with breakfast)  Patient not taking: Reported on 06/08/2022 03/20/22   Historical Provider, MD   hydroCHLOROthiazide (HYDRODIURIL) 25 MG tablet TAKE 1 TABLET BY MOUTH ONCE DAILY AS NEEDED  Patient not taking: Reported on 06/08/2022 03/17/22   Historical Provider, MD   metoprolol succinate (TOPROL XL) 100 MG extended release tablet TAKE 1 TABLET BY MOUTH ONCE DAILY WITH OR IMMEDIATELY FOLLOWING A MEAL 03/10/22   Historical Provider, MD       Allergies/Social/Family History:     Allergies   Allergen Reactions    Augmentin [Amoxicillin-Pot Clavulanate] Hives     Tolerated ceftriaxone 06/2022    Codeine      Unknown reaction      Social History     Tobacco Use    Smoking status: Not on file    Smokeless tobacco: Not on file   Substance Use Topics    Alcohol use: Not on file      No family history on file.    Review of Systems:     Review of systems not obtained due to patient factors.    Objective:   Vital Signs:  BP (!) 115/48  Pulse 63   Temp 98.4 F (36.9 C) (Axillary)   Resp 20   Ht 1.727 m (5\' 8" )   Wt 107.7 kg (237 lb 7 oz)   SpO2 95%   BMI 36.10 kg/m      Temp (24hrs), Avg:99.9 F (37.7 C), Min:98.4 F (36.9 C), Max:101.9 F (38.8 C)           Intake/Output:     Intake/Output Summary (Last 24 hours) at 06/15/2022 0916  Last data filed at 06/15/2022 0537  Gross per 24 hour   Intake 672.08 ml   Output 71 ml   Net 601.08 ml       Physical Exam:  Intubated,sedated  HEENT: dry mucus membranes  Heart: s1,s2  Lungs: poor air entry b/l  Abd: soft. Liver abscess drain  Ext: no edema  Cns: no focal deficit    LABS AND  DATA: Personally reviewed  Recent Labs     06/14/22  0245 06/15/22  0403   WBC 23.0* 21.7*   HGB 8.7* 8.0*   HCT 26.8* 24.3*   PLT 290 270     Recent Labs     06/14/22  0245 06/15/22  0403   NA 133* 132*   K 5.3* 5.6*   CL 101 100   CO2 21 20*   BUN 64* 86*   MG  --  2.9*   PHOS   --  8.6*     Recent Labs     06/14/22  0245 06/14/22  0646   GLOB 4.2* 3.1     Recent Labs     06/14/22  0646   INR 1.6*      Invalid input(s): PHI, PCO2I, PO2I, FIO2I  No results for input(s): CPK, CKMB in the last 72 hours.    Invalid input(s): TROIQ, BNPP    Hemodynamics:   PAP:   CO:     [place]: Discharge Planning  Living Arrangements: Spouse/Significant Other (06/05/22 1005)  Support Systems: Spouse/Significant Other;Family Members (06/05/22 1005)  Type of Residence: House (06/05/22 1005)  Potential Assistance Needed: N/A (06/05/22 1005)  Patient expects to be discharged to:: Unknown (06/05/22 1005) CI:         MEDS: Reviewed    Chest X-Ray: pul edema on 8/13      ECHO: 8/1 : normal EF    Multidisciplinary Rounds Completed:  Pending    ABCDEF Bundle/Checklist Completed:  Yes      ICU Assessment/ Comprehensive Plan of Care:     Jaime [name] Is a 76 y.o. male with ESRD on HG  who is admitted for liver abscess, now respiratory failure.    NEURO  Continue propofol for sedation    CARDIAC  Taper off levophed as tolerated  8/1 : ef was normal  Bolus 250 cc NS now and see if levophed can be tapered off      RESPIRATORY  Vent support  Pulmonary toilet  No significant secretions  Had atelectasis on ct chest : will increase PEEP  F/u cultures : ? HCAP  -     RENAL  IHD vs CVVHD today  K is elevated    Foley Catheter Present: No  IVFs: NS bolus 250 cc  x1    GASTROINTESTINAL  Liver drain  Will start trickle feeds today      ID  F/u cultures  Hemoglobin slightly reduced    ENDOCRINE  SSI    MUSCULOSKELETAL  scd's    Mobility: Poor  PT/OT: PT consulted  and on board and OT consulted and on board       DVT Prophylaxis: SCD's or Sequential Compression Device , enoxaparin  U - Ulcer Prophylaxis:   G - Glycemic Control: Insulin  B - Bowel Regimen:   Tubes: ETT  Lines: Central Line and [place]  Drains: Jackson-Pratt Drain (JP)    DISPOSITION/COMMUNICATION  Discussed Plan of Care/Code Status: Full Code  Stay in ICU    CRITICAL  CARE CONSULTANT NOTE  I had a face to face encounter with the patient, reviewed and interpreted patient data including clinical events, labs, images, vital signs, I/O's, and examined patient.  I have discussed the case and the plan and management of the patient's care with the consulting services, the bedside nurses and the respiratory therapist.      NOTE OF PERSONAL INVOLVEMENT IN CARE   This patient has a high probability of imminent, clinically significant deterioration, which requires the highest level of preparedness to intervene urgently. I participated in the decision-making and personally managed or directed the management of the following life and organ supporting interventions that required my frequent assessment to treat or prevent imminent deterioration.    I personally spent 40 minutes of critical care time.  This is time spent at this critically ill patient's bedside actively involved in patient care as well as the coordination of care.  This does not include any procedural time which has been billed separately.    Juliet Rude, MD  Intensivist  06/15/2022

## 2022-06-15 NOTE — Progress Notes (Signed)
EP/ ARRHYTHMIA Progress Note    Patient ID:  Patient: Jaime [name]  MRN: [id number]  Age: 76 y.o.  DOB: [birth year]    Date of  Admission: 05/31/2022 11:25 PM   PCP:  No primary care provider on file.  Usual cardiologist:  Jodelle Red, MD in [facility] [phone]    Assessment:   Sustained monomorphic VT but also with some salvoes earlier this admission when he was quite sick.  Suspect this is ectopic and due to critical illness and catecholamines.  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  Moderate aortic stenosis by outside echo 2020, limited study done here on 8/1.  Sepsis with GNR (E. Coli).  Diagnostic laparoscopy, laparotomy, EGD on 7/31.  IR drain to hepatic abscesses 8/4.  Multiorgan dysfunction, shock.  Severe thrombocytopenia, improved.  Full code.    Plan:     Continue amiodarone by NGT.    Supportive care from a cardiac standpoint.      [x]        High complexity decision making was performed in this patient at high risk for decompensation with multiple organ involvement.    Jaime [name] is a 76 y.o. male with a history of the above.  I was asked to consult due to recurrent VT requiring repeated amiodarone and shock attempts.     History reviewed. No pertinent past medical history.     Past Surgical History:   Procedure Laterality Date    BLADDER SURGERY N/A 06/01/2022    ENDOSCOPY OF ILEAL CONDUIT performed by 06/03/2022, MD at [facility] OR    CT VISCERAL PERCUTANEOUS DRAIN  06/05/2022    CT VISCERAL PERCUTANEOUS DRAIN 06/05/2022 [facility] RAD CT    LAPAROSCOPY N/A 06/01/2022    LAPAROSCOPY DIAGNOSTIC performed by 06/03/2022, MD at [facility]    LAPAROTOMY N/A 06/01/2022    LAPAROTOMY EXPLORATORY performed by 06/03/2022, MD at [facility]       Social History     Tobacco Use    Smoking status: Not on file    Smokeless tobacco: Not on file   Substance Use Topics    Alcohol use: Not on file        No family history on file.     Allergies   Allergen Reactions     Augmentin [Amoxicillin-Pot Clavulanate] Hives     Tolerated ceftriaxone 06/2022    Codeine      Unknown reaction          Current Facility-Administered Medications   Medication Dose Route Frequency    norepinephrine (LEVOPHED) 16 mg in sodium chloride 0.9 % 250 mL infusion  0.5-20 mcg/min IntraVENous Continuous    prismaSol BGK 2/3.5 dialysis solution   Dialysis Continuous    [START ON 06/16/2022] meropenem (MERREM) 1,000 mg in sodium chloride 0.9 % 100 mL IVPB (mini-bag)  1,000 mg IntraVENous Q12H    acidophilus probiotic capsule 1 capsule  1 capsule Oral Daily    propofol infusion  5-50 mcg/kg/min IntraVENous Continuous    vasopressin 20 Units in sodium chloride 0.9 % 100 mL infusion  0.03 Units/min IntraVENous Continuous    alcohol 62% (NOZIN) nasal sanitizer 1 ampule  1 ampule Topical BID    bisacodyl (DULCOLAX) suppository 10 mg  10 mg Rectal Daily PRN    insulin lispro (HUMALOG) injection vial 0-8 Units  0-8 Units SubCUTAneous TID WC    insulin lispro (HUMALOG) injection vial 0-4 Units  0-4 Units SubCUTAneous Nightly    albumin  human 25% IV solution 25 g  25 g IntraVENous PRN    amiodarone (CORDARONE) tablet 400 mg  400 mg Oral Daily    heparin (porcine) injection 1,200 Units  1,200 Units IntraCATHeter PRN    And    heparin (porcine) injection 1,000 Units  1,000 Units IntraCATHeter PRN    polyethylene glycol (GLYCOLAX) packet 17 g  17 g Oral Daily    sennosides-docusate sodium (SENOKOT-S) 8.6-50 MG tablet 2 tablet  2 tablet Oral BID    heparin (porcine) injection 5,000 Units  5,000 Units SubCUTAneous 3 times per day    lansoprazole (PREVACID SOLUTAB) disintegrating tablet 30 mg  30 mg Per NG tube Daily    hydrALAZINE (APRESOLINE) injection 10 mg  10 mg IntraVENous Q4H PRN    sodium chloride 0.9 % bolus 100 mL  100 mL IntraVENous PRN    fentaNYL (SUBLIMAZE) injection 50 mcg  50 mcg IntraVENous Q1H PRN    balsum peru-castor oil (VENELEX) ointment   Topical BID    glucose chewable tablet 16 g  4 tablet Oral PRN     dextrose bolus 10% 125 mL  125 mL IntraVENous PRN    Or    dextrose bolus 10% 250 mL  250 mL IntraVENous PRN    glucagon (rDNA) injection 1 mg  1 mg SubCUTAneous PRN    dextrose 10 % infusion   IntraVENous Continuous PRN    sodium chloride flush 0.9 % injection 5-40 mL  5-40 mL IntraVENous 2 times per day    sodium chloride flush 0.9 % injection 5-40 mL  5-40 mL IntraVENous PRN    0.9 % sodium chloride infusion   IntraVENous PRN    acetaminophen (TYLENOL) tablet 650 mg  650 mg Oral Q6H PRN    Or    acetaminophen (TYLENOL) suppository 650 mg  650 mg Rectal Q6H PRN    ondansetron (ZOFRAN) injection 4 mg  4 mg IntraVENous Q6H PRN       Review of Symptoms:  He cannot communicate.     Objective:      Physical Exam:  Temp (24hrs), Avg:99.4 F (37.4 C), Min:96.9 F (36.1 C), Max:101.9 F (38.8 C)    Patient Vitals for the past 8 hrs:   Pulse   06/15/22 1500 76   06/15/22 1400 73   06/15/22 1300 71   06/15/22 1231 68   06/15/22 1220 66   06/15/22 1100 66   06/15/22 1000 62   06/15/22 0900 63      Patient Vitals for the past 8 hrs:   Resp   06/15/22 1500 21   06/15/22 1400 22   06/15/22 1300 24   06/15/22 1231 19   06/15/22 1220 19   06/15/22 1100 19   06/15/22 1000 20   06/15/22 0900 19      Patient Vitals for the past 8 hrs:   BP   06/15/22 1500 (!) 114/47   06/15/22 1400 (!) 109/49   06/15/22 1300 (!) 110/50   06/15/22 1220 (!) 125/50   06/15/22 1100 (!) 119/52   06/15/22 1000 (!) 106/50   06/15/22 0900 (!) 116/56          Intake/Output Summary (Last 24 hours) at 06/15/2022 1612  Last data filed at 06/15/2022 0537  Gross per 24 hour   Intake 264.18 ml   Output 48 ml   Net 216.18 ml         Nondiaphoretic, not in acute distress.  Unlabored, clear to  auscultation bilaterally anteriorly, symmetric air movement.  Regular rate and rhythm, +soft systolic murmur, no pericardial rub.  No peripheral edema.  Extremities without cyanosis or clubbing.  Muscle tone and bulk normal for age.  Skin warm and dry.  Reduced perfusion to  toes.  Sedated and on the ventilator.    CARDIOGRAPHICS and STUDIES, I reviewed:    Telemetry:  SINUS RHYTHM.    ECG:  Studies reviewed from the last 24 hours.    Echo 8/1:  Left Ventricle Normal left ventricular systolic function with a visually estimated EF of 55 - 60%. Not well visualized. Left ventricle size is normal. Increased wall thickness. Unable to assess wall motion. Diastolic dysfunction present with normal LV EF.   Left Atrium Not well visualized.   Right Ventricle Not well visualized. Normal systolic function.   Right Atrium Not well visualized.   Aortic Valve Not well visualized. No regurgitation. No stenosis.   Mitral Valve Not well visualized. No regurgitation. No stenosis noted.   Tricuspid Valve Not well visualized. No regurgitation. No stenosis noted.   Pulmonic Valve The pulmonic valve was not well visualized.   Aorta Not well visualized. Normal sized sinus of Valsalva.   Pericardium No pericardial effusion.       Labs:  No results for input(s): CPK, CKMB in the last 72 hours.    Invalid input(s): CPKMB, CKNDX, TROIQ  No results found for: CHOL, CHOLX, CHLST, CHOLV, HDL, HDLC, LDL, LDLC, TGLX, TRIGL  Recent Labs     06/14/22  0646   INR 1.6*        Recent Labs     06/14/22  0245 06/15/22  0403   NA 133* 132*   K 5.3* 5.6*   CL 101 100   CO2 21 20*   BUN 64* 86*   PHOS  --  8.6*   WBC 23.0* 21.7*   HGB 8.7* 8.0*   HCT 26.8* 24.3*   PLT 290 270       Recent Labs     06/14/22  0245 06/14/22  0646   GLOB 4.2* 3.1       No components found for: GLPOC  No results for input(s): PH, PCO2, PO2 in the last 72 hours.      Tamera Punt, MD 06/15/22 4:12 PM

## 2022-06-15 NOTE — Consults (Signed)
[facility]   [facility]         Consult Note    Subjective:         Date of Consultation: June 15, 2022     Referring Physician: Wynetta Fines Chigurupati MD    Patient is a 76 y.o.  male who is being seen for bilateral toe necrosis.   Patient has a hx of sepsis due to hepatic abscess. Patient is currently seen in ICU. I was consulted for evaluation of his ischemic toes.    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) 06/12/2022    Bandemia 06/12/2022    Septicemia (HCC) 06/09/2022    E coli bacteremia 06/08/2022    Hepatic abscess 06/08/2022    Peripheral arterial disease (HCC) 06/08/2022    Hyperbilirubinemia 06/08/2022    Gram negative sepsis (HCC) 06/08/2022    Intestinal obstruction (HCC) 06/05/2022    Severe sepsis (HCC) 06/05/2022    Septic shock (HCC) 06/05/2022    Escherichia coli septicemia (HCC) 06/05/2022    Liver abscess 06/05/2022    Pneumoperitoneum 06/05/2022    Acute respiratory failure with hypoxia (HCC) 06/05/2022    AKI (acute kidney injury) (HCC) 06/05/2022    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 No pertinent past medical history.   Past Surgical History:   Procedure Laterality Date    BLADDER SURGERY N/A 06/01/2022    ENDOSCOPY OF ILEAL CONDUIT performed by Guinevere Ferrari, MD at [facility] MAIN OR    CT VISCERAL PERCUTANEOUS DRAIN  06/05/2022    CT VISCERAL PERCUTANEOUS DRAIN 06/05/2022 [facility] RAD CT    LAPAROSCOPY N/A 06/01/2022    LAPAROSCOPY DIAGNOSTIC performed by Guinevere Ferrari, MD at [facility]    LAPAROTOMY N/A 06/01/2022    LAPAROTOMY EXPLORATORY performed by Guinevere Ferrari, MD at [facility]      No family history on file.   Social History      Tobacco Use    Smoking status: Not on file    Smokeless tobacco: Not on file   Substance Use Topics    Alcohol use: Not on file     Current Facility-Administered Medications   Medication Dose Route Frequency Provider Last Rate Last Admin    norepinephrine (LEVOPHED) 16 mg in sodium chloride 0.9 % 250 mL infusion  0.5-20 mcg/min IntraVENous Continuous Lysle Morales, APRN - NP 8.4 mL/hr at 06/15/22 1626 9 mcg/min at 06/15/22 1626    prismaSol BGK 2/3.5 dialysis solution   Dialysis Continuous Bryan Lemma, MD 2,800 mL/hr at 06/15/22 1947 New Bag at 06/15/22 1947    [START ON 06/16/2022] meropenem (MERREM) 1,000 mg in sodium chloride 0.9 % 100 mL IVPB (mini-bag)  1,000 mg IntraVENous Q12H Naga S Chigurupati, MD        acidophilus probiotic capsule 1 capsule  1 capsule Oral Daily Richardean Chimera, MD   1 capsule at 06/15/22 1624    propofol infusion  5-50 mcg/kg/min IntraVENous Continuous Lane Hacker, APRN - NP 19.4 mL/hr at 06/15/22 2143 30 mcg/kg/min at 06/15/22 2143    vasopressin 20 Units in sodium chloride 0.9 %  100 mL infusion  0.03 Units/min IntraVENous Continuous Lane Hacker, APRN - NP   Held at 06/14/22 1447    alcohol 62% (NOZIN) nasal sanitizer 1 ampule  1 ampule Topical BID Wynetta Fines Chigurupati, MD   1 ampule at 06/15/22 2023    bisacodyl (DULCOLAX) suppository 10 mg  10 mg Rectal Daily PRN Yevonne Pax, MD        insulin lispro (HUMALOG) injection vial 0-8 Units  0-8 Units SubCUTAneous TID WC Bess Harvest, APRN - CNS        insulin lispro (HUMALOG) injection vial 0-4 Units  0-4 Units SubCUTAneous Nightly Bess Harvest, APRN - CNS        albumin human 25% IV solution 25 g  25 g IntraVENous PRN Andrez Grime, MD   Stopped at 06/13/22 1557    amiodarone (CORDARONE) tablet 400 mg  400 mg Oral Daily Randalyn Rhea, MD   400 mg at 06/15/22 0858    heparin (porcine) injection 1,200 Units  1,200 Units IntraCATHeter PRN Andrez Grime, MD   1,200 Units at 06/12/22 1206    And     heparin (porcine) injection 1,000 Units  1,000 Units IntraCATHeter PRN Andrez Grime, MD   1,000 Units at 06/12/22 1205    polyethylene glycol (GLYCOLAX) packet 17 g  17 g Oral Daily Modena Slater, MD   17 g at 06/15/22 0858    sennosides-docusate sodium (SENOKOT-S) 8.6-50 MG tablet 2 tablet  2 tablet Oral BID Modena Slater, MD   2 tablet at 06/15/22 2023    heparin (porcine) injection 5,000 Units  5,000 Units SubCUTAneous 3 times per day Modena Slater, MD   5,000 Units at 06/15/22 2137    lansoprazole (PREVACID SOLUTAB) disintegrating tablet 30 mg  30 mg Per NG tube Daily Modena Slater, MD   30 mg at 06/15/22 0858    hydrALAZINE (APRESOLINE) injection 10 mg  10 mg IntraVENous Q4H PRN Modena Slater, MD   10 mg at 06/08/22 0855    sodium chloride 0.9 % bolus 100 mL  100 mL IntraVENous PRN Luisa Hart, MD        fentaNYL (SUBLIMAZE) injection 50 mcg  50 mcg IntraVENous Q1H PRN Modena Slater, MD   50 mcg at 06/07/22 0519    balsum peru-castor oil (VENELEX) ointment   Topical BID Sarita Bottom, APRN - NP   Given at 06/15/22 2025    glucose chewable tablet 16 g  4 tablet Oral PRN Bess Harvest, APRN - CNS        dextrose bolus 10% 125 mL  125 mL IntraVENous PRN Bess Harvest, APRN - CNS        Or    dextrose bolus 10% 250 mL  250 mL IntraVENous PRN Bess Harvest, APRN - CNS        glucagon (rDNA) injection 1 mg  1 mg SubCUTAneous PRN Bess Harvest, APRN - CNS        dextrose 10 % infusion   IntraVENous Continuous PRN Bess Harvest, APRN - CNS        sodium chloride flush 0.9 % injection 5-40 mL  5-40 mL IntraVENous 2 times per day Sarita Bottom, APRN - NP   10 mL at 06/15/22 2025    sodium chloride flush 0.9 % injection 5-40 mL  5-40 mL IntraVENous PRN Sarita Bottom, APRN - NP        0.9 % sodium chloride infusion   IntraVENous PRN Jill Side  B [place], APRN - NP        acetaminophen (TYLENOL) tablet 650 mg  650 mg Oral Q6H PRN Sarita Bottom, APRN - NP   650 mg at 06/14/22 2115     Or    acetaminophen (TYLENOL) suppository 650 mg  650 mg Rectal Q6H PRN Sarita Bottom, APRN - NP   650 mg at 06/01/22 1230    ondansetron (ZOFRAN) injection 4 mg  4 mg IntraVENous Q6H PRN Sarita Bottom, APRN - NP          Allergies   Allergen Reactions    Augmentin [Amoxicillin-Pot Clavulanate] Hives     Tolerated ceftriaxone 06/2022    Codeine      Unknown reaction        Review of Systems   Unable to perform ROS: Intubated       Objective:     Patient Vitals for the past 8 hrs:   BP Temp Temp src Pulse Resp SpO2   06/15/22 2014 -- -- -- 75 20 94 %   06/15/22 2000 (!) 119/43 99.2 F (37.3 C) Axillary 71 16 94 %   06/15/22 1945 (!) 121/43 -- -- 72 17 94 %   06/15/22 1930 (!) 130/44 -- -- 69 17 95 %   06/15/22 1915 (!) 123/46 -- -- 72 19 95 %   06/15/22 1900 (!) 118/48 -- -- 70 19 96 %   06/15/22 1800 (!) 126/49 -- -- 74 18 96 %   06/15/22 1700 (!) 104/46 -- -- 76 (!) 0 93 %   06/15/22 1631 -- -- -- 75 21 93 %   06/15/22 1600 (!) 104/45 -- -- 78 18 94 %   06/15/22 1500 (!) 114/47 98.4 F (36.9 C) Axillary 76 21 93 %   06/15/22 1400 (!) 109/49 -- -- 73 22 95 %     Temp (24hrs), Avg:98.5 F (36.9 C), Min:96.9 F (36.1 C), Max:99.9 F (37.7 C)      Physical Exam:    DERM:Bilateral toes 1-10 discoloration with gangrenous changes.  VASC: Pedal pulses (DP/PT) diminished to B/L LE. Skin temp is warm to cool from proximal to distal for B/L LE. Neg homans/pratts signs to B/L LE. No varicosities or telangectasias noted to B/L LE.  NEURO: Protective and epicritic sensations grossly intact to B/L LE  MSK: (-) POP, No gross deformities. Good muscle tone and bulk noted to B/L LE.  PSYCH: Cooperative with normal mood and affect     Lab Review:   Recent Results (from the past 24 hour(s))   Phosphorus    Collection Time: 06/15/22  4:03 AM   Result Value Ref Range    Phosphorus 8.6 (H) 2.6 - 4.7 MG/DL   Magnesium    Collection Time: 06/15/22  4:03 AM   Result Value Ref Range    Magnesium 2.9 (H) 1.6 - 2.4 mg/dL    Basic Metabolic Panel    Collection Time: 06/15/22  4:03 AM   Result Value Ref Range    Sodium 132 (L) 136 - 145 mmol/L    Potassium 5.6 (H) 3.5 - 5.1 mmol/L    Chloride 100 97 - 108 mmol/L    CO2 20 (L) 21 - 32 mmol/L    Anion Gap 12 5 - 15 mmol/L    Glucose 171 (H) 65 - 100 mg/dL    BUN 86 (H) 6 - 20 MG/DL    Creatinine 4.62 (H) 0.70 - 1.30 MG/DL  Bun/Cre Ratio 15 12 - 20      Est, Glom Filt Rate 10 (L) >60 ml/min/1.66m2    Calcium 7.9 (L) 8.5 - 10.1 MG/DL   CBC with Auto Differential    Collection Time: 06/15/22  4:03 AM   Result Value Ref Range    WBC 21.7 (H) 4.1 - 11.1 K/uL    RBC 2.64 (L) 4.10 - 5.70 M/uL    Hemoglobin 8.0 (L) 12.1 - 17.0 g/dL    Hematocrit 76.7 (L) 36.6 - 50.3 %    MCV 92.0 80.0 - 99.0 FL    MCH 30.3 26.0 - 34.0 PG    MCHC 32.9 30.0 - 36.5 g/dL    RDW 34.1 (H) 93.7 - 14.5 %    Platelets 270 150 - 400 K/uL    MPV 11.3 8.9 - 12.9 FL    Nucleated RBCs 0.1 (H) 0 PER 100 WBC    nRBC 0.02 (H) 0.00 - 0.01 K/uL    Neutrophils % 77 (H) 32 - 75 %    Lymphocytes % 10 (L) 12 - 49 %    Monocytes % 11 5 - 13 %    Eosinophils % 0 0 - 7 %    Basophils % 0 0 - 1 %    Immature Granulocytes 2 (H) 0.0 - 0.5 %    Neutrophils Absolute 16.7 (H) 1.8 - 8.0 K/UL    Lymphocytes Absolute 2.2 0.8 - 3.5 K/UL    Monocytes Absolute 2.4 (H) 0.0 - 1.0 K/UL    Eosinophils Absolute 0.0 0.0 - 0.4 K/UL    Basophils Absolute 0.0 0.0 - 0.1 K/UL    Absolute Immature Granulocyte 0.4 (H) 0.00 - 0.04 K/UL    Differential Type SMEAR SCANNED      RBC Comment ANISOCYTOSIS  1+        RBC Comment TEARDROP CELLS  1+       POCT Glucose    Collection Time: 06/15/22  8:33 AM   Result Value Ref Range    POC Glucose 182 (H) 65 - 117 mg/dL    Performed by: Vernell Leep PCT    POCT Glucose    Collection Time: 06/15/22  4:24 PM   Result Value Ref Range    POC Glucose 140 (H) 65 - 117 mg/dL    Performed by: Erasmo Downer N RN    POCT Glucose    Collection Time: 06/15/22  8:34 PM   Result Value Ref Range    POC Glucose 125 (H) 65 - 117 mg/dL     Performed by: Sharen Hint RN        Results:    MRI Results (most recent):  No results found for this or any previous visit from the past 3650 days.       XR Results (most recent):  XR CHEST PORTABLE 06/14/2022    Narrative  EXAM:  XR CHEST PORTABLE    INDICATION: Central line    COMPARISON: 06/14/2022    TECHNIQUE: Semiupright portable chest AP view    FINDINGS: Endotracheal tube is in appropriate position. Right IJ central venous  catheter is noted. New left IJ central venous catheter is in place with the tip  in the IVC. The cardiac silhouette is within normal limits. Increased pulmonary  edema.    Bilateral pleural effusions right greater than left right lower lobe volume loss  without significant change. The visualized bones and upper abdomen are  age-appropriate.    Impression  1.  Status post left IJ central venous catheter placement. The tip is in the IVC  below the diaphragm. No pneumothorax.    2.  Increased pulmonary edema.             Assessment:   Gangrene bilateral toes       Plan:     Patient seen and evaluated at bedside  - Current labs personally reviewed and findings dicussed with patient  - Recommend at this time continuing monitoring for complete demarcation. Will make possible surgical plan for amputation once toes demarcate.  -Wound care. Paint betadine to toes bilateral feet daily.  - We will continue to monitor patient.        Dierdre Harnessalph Kachina Niederer, DPM, CWSP, AACFAS  [facility]  [facility]  [place], [place] [id number]  [place]: [phone]  F: [phone]

## 2022-06-15 NOTE — Other (Signed)
CRRT Initiation     Primary RN SBAR: Jaime Negus, RN  Patient Education: unable/intubated - no family present    Patient orders, notes, labs, and code status reviewed. Time out completed. CVVHDF initiated as per MD order. Consent for CRRT remains filed on chart from earlier this admission.     HF1000 primed and tested. Lines are visible, secure and REVERSED. Inservice provided to primary RN related to PrisMax machine and current therapy.     Hepatitis B Surface Ag   Date/Time Value Ref Range Status   06/01/2022 10:08 PM <0.10 Index Final     Hep B S Ab   Date/Time Value Ref Range Status   06/01/2022 10:08 PM <3.10 mIU/mL Final          06/15/22 1220   Observations & Evaluations   Heart Rhythm   (ICU monitoring)   Respiratory Quality/Effort   (assisted)   O2 Device Ventilator   Skin Color Pale   Skin Condition/Temp Cool;Dry   RUE Edema +2;Pitting   LUE Edema +2;Pitting   RLE Edema +2;Pitting   LLE Edema +2;Pitting   Vital Signs   BP (!) 125/50   Temp 96.9 F (36.1 C)   Pulse 66   Respirations 19   SpO2 95 %   Technical Checks   ICEBOAT I;C;E;B;O;A;T   Hemodialysis Central Access Right Neck   Placement Date/Time: 06/09/22 1830   Present on Admission/Arrival: No  Inserted by: dr. Rutherford Limerick  Orientation: Right  Access Location: Neck   Continued need for line? Yes   Site Assessment Clean, dry & intact   Venous Lumen Status Infusing   Arterial Lumen Status Infusing   Alcohol Cap Used No   Dressing Type Transparent   Date of Last Dressing Change 06/12/22   Dressing Status Clean, dry & intact   Dressing Change Due 06/16/22        06/15/22 1220   Treatment   $CRRT $Yes   Machine #   ([id number])   Cartridge Lot #   [id number])   Therapy Type CVVHDF   Pressures   Access (mmHg) -57 mmHg  (lines reversed)   Return/Venous (mmHg) 56 mmHg   Effluent (mmHg) 64 mmHg   Filter (mmHg) 146 mmHg   TMP Pressure (mmHg) 23 mmHg   Pressure Drop (mmHg) 53 mmHg   Deaeration Chamber Check Yes   Flow Rates   Therapy Fluid (L/hr) 2800 L/hr   Blood Flow  (mL/min) 200 mL/min   Replacement Filter Post-Filter (mL/hr) 700 mL/hr   Dialysate (mL/hr) 1400 mL/hr   Pre-Blood Pump (mL/hr) 700 mL/hr   Non-CRRT Intake   IV/IVPB (mL) 30 mL   Total Non-CRRT Intake (Calculated) 30   Non-CRRT Output   Urine (mL) 0   Total Non-CRRT Output  (Calculated) 0 mL   System Used   System Used [employer] Calculation   (A) Total Non-NxStage Intake (mL) 30 mL   (C) Total Non-NxStage Output (mL) 0 mL/hr   Prisma Calculation   (A) Total Non-Prisma Intake (mL) 30 mL   (B) Total Non-Prisma Output (mL) 0 mL   (C) Balance (A-B) 30 ml   (D) Physician Ordered Hourly Removal (mL) 0 ml   (F) Amount Programmed to Remove (H from last hour) 30   CRRT Activities   Intervention Initiated   Hemodialysis Central Access Right Neck   Placement Date/Time: 06/09/22 1830   Present on Admission/Arrival: No  Inserted by: dr. Rutherford Limerick  Orientation: Right  Access Location: Neck  Continued need for line? Yes   Site Assessment Clean, dry & intact   Venous Lumen Status Infusing   Arterial Lumen Status Infusing   Alcohol Cap Used No   Dressing Type Transparent   Date of Last Dressing Change 06/12/22   Dressing Status Clean, dry & intact   Dressing Change Due 06/16/22

## 2022-06-15 NOTE — Progress Notes (Signed)
Comprehensive Nutrition Assessment    Type and Reason for Visit:  Reassess    Nutrition Recommendations/Plan:   Agree with initiation of trophic TF:  Nepro @ 10 + Prosource 4 x day + 29m flush q 4h   Would stop TF if any signs/sx of NOMI (increasing lactic acid, abdominal pain, abdominal distension)  Tomorrow if pressor requirements have improved, would advance TF rate 142mq 12h as tolerated to Goal of 3088m + Prosource 4 x day + 70m81mush q 4h (provides 1616kcals/138gPro/115gCHO/823mL38m   Malnutrition Assessment:  Malnutrition Status:  Insufficient data (06/04/22 1319)        Nutrition Assessment:  Chart reviewed, case discussed during CCU rounds.  Pt intubated yesterday, needs re-estimated.  He is sedated with propofol @ 12.9mL/h59mhich provides 340kcals daily.  Levo is being weaned, down to 7 this morning.  Plans to start CVVHD per Nephrology, will need to monitor pressor needs.  Plans to start trophic TF today as written above.  K 5.6, phos 8.6, agree with Nepro for now (may need to switch to standard volume restricted formula once lytes normalize).  Provided goal rate recommendations above as well.        Nutrition Related Findings:    Meds: humalog, prevacid, merrem, glycolax, senokot; Drips: propofol, levo.  Edema: +2 pitting-generalized, anasarca, +2 pitting-all extremities.  BM: 8/14 Wound Type: Surgical Incision       Current Nutrition Intake & Therapies:    Average Meal Intake: NPO     ADULT TUBE FEEDING; Nasogastric; Renal Formula; Continuous; 10; No; 50; Q 4 hours; Protein; Prosource 4 x day    Anthropometric Measures:  Height: 172.7 cm (5' 8" )  Ideal Body Weight (IBW): 154 lbs (70 kg)       Current Body Weight: 237 lb 7 oz (107.7 kg), 154.2 % IBW. Weight Source: Bed Scale  Current BMI (kg/m2): 36.1                          BMI Categories: Obese Class 2 (BMI 35.0 -39.9)    Estimated Daily Nutrient Needs:  Energy Requirements Based On: Formula  Weight Used for Energy Requirements: Current  Energy  (kcal/day): PSU 2139 (MSJ 1787)  Weight Used for Protein Requirements: Ideal  Protein (g/day): 119-140g (1.7-2gPro/kg IBW)  Method Used for Fluid Requirements: Other (Comment)  Fluid (ml/day): per MD    Nutrition Diagnosis:   Inadequate protein-energy intake related to impaired respiratory function as evidenced by NPO or clear liquid status due to medical condition  Previous dx continues.     Nutrition Interventions:   Food and/or Nutrient Delivery: Start Tube Feeding  Nutrition Education/Counseling: No recommendation at this time  Coordination of Nutrition Care: Continue to monitor while inpatient, Interdisciplinary Rounds       Goals:  Previous Goal Met: Progressing toward Goal(s)  Goals: Initiate nutrition support, by next RD assessment (with residuals <500mL a74mo signs/sx of intolerance)       Nutrition Monitoring and Evaluation:   Behavioral-Environmental Outcomes: None Identified  Food/Nutrient Intake Outcomes: Enteral Nutrition Intake/Tolerance, IVF Intake  Physical Signs/Symptoms Outcomes: Biochemical Data, Nutrition Focused Physical Findings, Skin, Weight, GI Status, Hemodynamic Status, Fluid Status or Edema    Discharge Planning:    Too soon to determine     Jaime [name]  Contact: ext [phone]

## 2022-06-15 NOTE — Progress Notes (Addendum)
1900: Received verbal and bedside report from Earl Many, RN. Patient has ongoing Levophed 67mcg/min and Propofol 61mcg/kg/min. Patient is on mechanical ventilator with the following settings: AV/VC mode, FiO2 30%, TV 500, RR 15 and PEEP 9. With bilateral wrist soft restraints. On CRRT, CVVHDF mode.    2000: Complex assessment done, see flowsheet for details. Patient is currently on sinus rhythm, MAP >31mmHg, afebrile, sats 94%. Secretion is yellowish thick in moderate amount. Lip sore noted. DTI on buttocks, sacrum with excoriation / stage 2.    2158: Ventilator alarm is always high peak pressure, suctioned secretions. Increased Propofol to 69mcg/kg/min.    2220: MAP < , titrated Levophed accordingly.    0000: Reassessment done, no change to previous assessment.    0130: CRRT filter changed by Davita RN,    0300: IV tubings, CVC and quinton dressing changed.    0344: Labs collected for CBC, BMP, Mg, Phos, Triglycerides and Hepatic Function Test.    0400: Reassessment done, no change to previous assessment.    0450: Blood sample for BMP, MG, Phos, Triglycerides and HFP contaminated according to lab tech.    0500: CHG bathed given. Abdominal dressing changed.     0540: Labs collected.    9381: Blood sample for BMP, MG, Phos, Triglycerides and HFP contaminated according to [employer], Designer, industrial/product. Advised to stick the patient.    0705: Tried to stick the patient twice but unsucccessful to get blood sample.    0730: Bedside and verbal report given to [facility], Charity fundraiser.

## 2022-06-15 NOTE — Progress Notes (Signed)
Infectious Disease Progress    Impression    Sepsis  S/p septic shock    Acute hypoxic respiratory failure  Re intubated 8/13  Initially intubated 7/31, s/p extubation 8/7  CXR+ for pulmonary edema.  Resp cultures -pending      E. coli bacteremia  Blood cultures 7/31+ for E. coli   2/2 LAC (pan sensitive)   Negative repeat cultures 8/4       Acute abdomen  Pneumoperitoneum  S/p diagnostic laparoscopy, laparotomy  EGD 7/31  Findings of cloudy fluid in right upper quadrant, yellow/white  Inflammatory peel over lesser curve of stomach, inferior lobe of liver  No gross perforation identified  Pancreas and retroperitoneum appeared edematous  Intra-Op fluid culture + for E. Coli (pansensitive)    Hepatic abscess  Gas and fluid containing collection 7.1 x 10.8 x 5.4 cm  In anterior left lobe of liver, patchy areas of hypodensity right lobe  4 x 4.5 x 5.5 cm collection  S/p drainage by IR 8/4.Cultures + for  few E.coli  Repeat CT 8/7 shows reduction in size of hepatic abscess  Plan was for ceftriaxone IV x6 weeks end date 9/15, Flagyl  Until 8/18  Repeat CT 8/13 + for    S/p percutaneous drainage of left hepatic abscess with  Minimal fluid remaining around catheter, suggestion of peripancreatic edema, small bilateral pleural effusions  Serum lipase 718      Persistent leukocytosis  Slightly improved  WBC 21.7      Gangrene, discoloration of toes  Persist    AKI  Cr 5. 6 5  S/p CV VH  Currently on HD    Coagulopathy  Improving    Thrombocytopenia  Resolved platelets 290    Transaminitis  Improving    Hyperbilirubinemia improved  Bilirubin 4.0    Diabetes type 2  Hyperglycemia  A1c 7.7    Obesity  BMI 34.76    Lines-drain -7/31, TLC-8/13, HD access-8/8,   E T-8/13, FMS 8/14    Plan  Continue meropenem IV for now  Add probiotic  Agree with plan for repeat echo-evaluate   cardiac function  ID service to follow.      Extensive review of chart notes, labs, imaging, cultures done  Additionally review of done:  D/w ICU team  Jaime [name]  is a 76 y/o male PMHx of HTN, HLD and T2DM admitted 7/31 for septic shock in the setting of perforated viscus.  Presented to ED via EMS c/o approximately 2 days of severe abdominal pain, nausea and vomiting. He was in town from [place] for the [employer]  ED work- up revealed  B/P 80/42, HR 112, Afebrile, Leukocytosis 16.6, lactic acidosis 10,  AKI, Cr 4.06. CT A/P demonstrated perforated viscous. Received 2L LR, started on Levophed, Vancomycin/Zosyn. Surgery consulted & to OR for emergent exploratory laparotomy.  Did not reveal perforation but fluid collection in the right upper quadrant.  Cultures were sent and back with E. coli pansensitive.  Blood cultures done on 7/31+ for pansensitive E. coli 2/2 LAC.  Patient transferred to ICU post op early 7/31.  Increasing pressor needs, shock liver, thrombocytopenia, coagulopathic, anuric AKI with developing hyperkalemia and started on CHHF. Stress dose steroids and thiamine added for refractory shock. Abx changed to Vanc and merrem.   On 8/1 patient developed VT episodes with pulse, shocked x 3 and amio boluses 150 mg each x 3 fallowed by amio drip. Cardio consulted.  On 8/3  He was intubated and sedated  and 2 pressors.   Patient remains intubated and sedated today.  He has been found found to have liver abscesses on CT abd, plan to place drain today by IR.  Patient is on lower dose of pressors.  Antimicrobial therapy  to date  Cefepime x 1 dose         7/31  Zosyn  x 1 dose              7/31  Vancomycin x 1 dose     7/31  Metronidazole                 7/31-8/2 and 8/3-8/4  Ceftriaxone                     8/2-8/4  Eraxis                            7/31- 8/4    Patient improved and was downgraded to stepdown on 8/11  But became acutely hypotensive and unresponsive with change of rhythm from NSR to A-fib on 8/13.  Patient became hypotensive and unresponsive.  He was transferred to ICU and intubated.  Patient was also started on pressors.    Patient seen  today.  Remains intubated and sedated.  Pressor requirements are less.D/w Dr. Salena Saner    History reviewed. No pertinent past medical history.    Past Surgical History:   Procedure Laterality Date    BLADDER SURGERY N/A 06/01/2022    ENDOSCOPY OF ILEAL CONDUIT performed by Guinevere Ferrari, MD at [facility]    CT VISCERAL PERCUTANEOUS DRAIN  06/05/2022    CT VISCERAL PERCUTANEOUS DRAIN 06/05/2022 [facility] CT    LAPAROSCOPY N/A 06/01/2022    LAPAROSCOPY DIAGNOSTIC performed by Guinevere Ferrari, MD at [facility]    LAPAROTOMY N/A 06/01/2022    LAPAROTOMY EXPLORATORY performed by Guinevere Ferrari, MD at [facility]       Allergies   Allergen Reactions    Augmentin [Amoxicillin-Pot Clavulanate] Hives     Tolerated ceftriaxone 06/2022    Codeine      Unknown reaction       Social Connections: Not on file       No family status information on file.           Review of Systems - Negative except those mentioned in H&P      PHYSICAL EXAM:  General:          Awake, in no distress  EENT:              EOMI. Anicteric sclerae. MMM  Resp:               CTA bilaterally, no wheezing or rales.  No accessory muscle use  CV:                  Regular  rhythm,  No edema  GI:                   Soft, Non distended, Non tender.  +Bowel sounds  Neurologic:      Alert and oriented X 3, normal speech,   Psych:             Good insight. Not anxious nor agitated  Skin:                No rashes.  No jaundice.  Extremities :  No edema,  discoloration of  toes++.     Jaime Amato, MD [facility]

## 2022-06-16 ENCOUNTER — Inpatient Hospital Stay: Admit: 2022-06-16 | Payer: MEDICARE

## 2022-06-16 LAB — CBC
Hematocrit: 21.7 % — ABNORMAL LOW (ref 36.6–50.3)
Hemoglobin: 7.1 g/dL — ABNORMAL LOW (ref 12.1–17.0)
MCH: 31.1 PG (ref 26.0–34.0)
MCHC: 32.7 g/dL (ref 30.0–36.5)
MCV: 95.2 FL (ref 80.0–99.0)
MPV: 11.6 FL (ref 8.9–12.9)
Nucleated RBCs: 0.1 PER 100 WBC — ABNORMAL HIGH
Platelets: 224 10*3/uL (ref 150–400)
RBC: 2.28 M/uL — ABNORMAL LOW (ref 4.10–5.70)
RDW: 19.9 % — ABNORMAL HIGH (ref 11.5–14.5)
WBC: 19.2 10*3/uL — ABNORMAL HIGH (ref 4.1–11.1)
nRBC: 0.02 10*3/uL — ABNORMAL HIGH (ref 0.00–0.01)

## 2022-06-16 LAB — POCT GLUCOSE
POC Glucose: 125 mg/dL — ABNORMAL HIGH (ref 65–117)
POC Glucose: 148 mg/dL — ABNORMAL HIGH (ref 65–117)
POC Glucose: 171 mg/dL — ABNORMAL HIGH (ref 65–117)
POC Glucose: 191 mg/dL — ABNORMAL HIGH (ref 65–117)

## 2022-06-16 LAB — CULTURE, RESPIRATORY
Culture: NORMAL
Gram stain: NONE SEEN

## 2022-06-16 LAB — TRIGLYCERIDES: Triglycerides: 474 MG/DL — ABNORMAL HIGH (ref <150)

## 2022-06-16 LAB — BASIC METABOLIC PANEL
Anion Gap: 8 mmol/L (ref 5–15)
BUN: 43 MG/DL — ABNORMAL HIGH (ref 6–20)
Bun/Cre Ratio: 15 (ref 12–20)
CO2: 24 mmol/L (ref 21–32)
Calcium: 8.5 MG/DL (ref 8.5–10.1)
Chloride: 101 mmol/L (ref 97–108)
Creatinine: 2.81 MG/DL — ABNORMAL HIGH (ref 0.70–1.30)
Est, Glom Filt Rate: 23 mL/min/{1.73_m2} — ABNORMAL LOW (ref >60)
Glucose: 151 mg/dL — ABNORMAL HIGH (ref 65–100)
Potassium: 4.1 mmol/L (ref 3.5–5.1)
Sodium: 133 mmol/L — ABNORMAL LOW (ref 136–145)

## 2022-06-16 LAB — MAGNESIUM: Magnesium: 2.2 mg/dL (ref 1.6–2.4)

## 2022-06-16 LAB — PHOSPHORUS: Phosphorus: 4 MG/DL (ref 2.6–4.7)

## 2022-06-16 MED ORDER — DAPTOMYCIN 500 MG IV SOLR
500 | INTRAVENOUS | Status: DC
Start: 2022-06-16 — End: 2022-06-18
  Administered 2022-06-16 – 2022-06-17 (×2): 500 mg/kg via INTRAVENOUS

## 2022-06-16 MED ORDER — AMIODARONE HCL IN DEXTROSE 150-4.21 MG/100ML-% IV SOLN
Freq: Once | INTRAVENOUS | Status: AC
Start: 2022-06-16 — End: 2022-06-16
  Administered 2022-06-16: 16:00:00 300 mg via INTRAVENOUS

## 2022-06-16 MED ORDER — VIAL2BAG ADAPTOR (20 MM)
450 | INTRAVENOUS | Status: DC
Start: 2022-06-16 — End: 2022-06-19
  Administered 2022-06-16 – 2022-06-17 (×3): 0.5 mg/min via INTRAVENOUS

## 2022-06-16 MED ORDER — SODIUM CHLORIDE 0.9 % IV SOLN
0.9 | INTRAVENOUS | Status: DC
Start: 2022-06-16 — End: 2022-06-20
  Administered 2022-06-16: 16:00:00 0.2 ug/kg/h via INTRAVENOUS
  Administered 2022-06-17 (×2): 0.4 ug/kg/h via INTRAVENOUS
  Administered 2022-06-18: 07:00:00 0.2 ug/kg/h via INTRAVENOUS

## 2022-06-16 MED FILL — ACETAMINOPHEN 325 MG PO TABS: 325 MG | ORAL | Qty: 2

## 2022-06-16 MED FILL — HEALTHYLAX 17 G PO PACK: 17 g | ORAL | Qty: 1

## 2022-06-16 MED FILL — AMIODARONE HCL 200 MG PO TABS: 200 MG | ORAL | Qty: 2

## 2022-06-16 MED FILL — NOREPINEPHRINE-SODIUM CHLORIDE 16-0.9 MG/250ML-% IV SOLN: INTRAVENOUS | Qty: 250

## 2022-06-16 MED FILL — HEPARIN SODIUM (PORCINE) 5000 UNIT/ML IJ SOLN: 5000 UNIT/ML | INTRAMUSCULAR | Qty: 1

## 2022-06-16 MED FILL — PRECEDEX 200 MCG/2ML IV SOLN: 200 MCG/2ML | INTRAVENOUS | Qty: 4

## 2022-06-16 MED FILL — NEXTERONE 150-4.21 MG/100ML-% IV SOLN: INTRAVENOUS | Qty: 200

## 2022-06-16 MED FILL — SENNA PLUS 8.6-50 MG PO TABS: ORAL | Qty: 2

## 2022-06-16 MED FILL — LANSOPRAZOLE 30 MG PO TBDD: 30 MG | ORAL | Qty: 1

## 2022-06-16 MED FILL — DIPRIVAN 1000 MG/100ML IV EMUL: 1000 MG/100ML | INTRAVENOUS | Qty: 100

## 2022-06-16 MED FILL — RISAQUAD-2 PO CAPS: ORAL | Qty: 1

## 2022-06-16 MED FILL — DEXMEDETOMIDINE HCL 200 MCG/2ML IV SOLN: 200 MCG/2ML | INTRAVENOUS | Qty: 400

## 2022-06-16 MED FILL — AMIODARONE HCL 450 MG/9ML IV SOLN: 450 MG/9ML | INTRAVENOUS | Qty: 9

## 2022-06-16 MED FILL — MERREM 1 G IV SOLR: 1 g | INTRAVENOUS | Qty: 1000

## 2022-06-16 MED FILL — DAPTOMYCIN 500 MG IV SOLR: 500 MG | INTRAVENOUS | Qty: 10

## 2022-06-16 NOTE — Progress Notes (Signed)
Occupational Therapy    Chart reviewed. Pt remains intubated/sedated. OT will defer and continue to follow.

## 2022-06-16 NOTE — Progress Notes (Signed)
Infectious Disease Progress    Impression    Sepsis  S/p septic shock    Acute hypoxic respiratory failure  Re intubated 8/13  Initially intubated 7/31, s/p extubation 8/7  CXR+ for pulmonary edema.  Resp cultures -pending      E. coli bacteremia  Blood cultures 7/31+ for E. coli   2/2 LAC (pan sensitive)   Negative repeat cultures 8/4       Acute abdomen  Pneumoperitoneum  S/p diagnostic laparoscopy, laparotomy  EGD 7/31  Findings of cloudy fluid in right upper quadrant, yellow/white  Inflammatory peel over lesser curve of stomach, inferior lobe of liver  No gross perforation identified  Pancreas and retroperitoneum appeared edematous  Intra-Op fluid culture + for E. Coli (pansensitive)    Hepatic abscess  Gas and fluid containing collection 7.1 x 10.8 x 5.4 cm  In anterior left lobe of liver, patchy areas of hypodensity right lobe  4 x 4.5 x 5.5 cm collection  S/p drainage by IR 8/4.Cultures + for  few E.coli  Repeat CT 8/7 shows reduction in size of hepatic abscess  Plan was for ceftriaxone IV x6 weeks end date 9/15, Flagyl  Until 8/18  Repeat CT 8/13 + for    S/p percutaneous drainage of left hepatic abscess with  Minimal fluid remaining around catheter, suggestion of peripancreatic edema, small bilateral pleural effusions  Serum lipase 718      Persistent leukocytosis  Slightly improved  WBC 19.2      Gangrene, discoloration of toes  Persist    AKI  Cr 5. 6 5  S/p CV VH  Currently on CVVD    Coagulopathy  Improving    Thrombocytopenia  platelets 224    Transaminitis  Improving    Hyperbilirubinemia   Bilirubin 4.3    Diabetes type 2  Hyperglycemia  A1c 7.7    Diarrhea  FMS present    Obesity  BMI 34.76    Lines-drain -7/31, TLC-8/13, HD access-8/8,   E T-8/13, FMS 8/14    Plan  Continue meropenem IV , add daptomycin  Add probiotic  CT abdomen/pelvis with p.o. and IV contrast  when extubated and more stable  Vent, fluids per ICU team.  For    Extensive review of chart notes, labs, imaging, cultures  done  Additionally review of done:  D/w ICU team, Dr. Pearletha Forge Wert  is a 76 y/o male PMHx of HTN, HLD and T2DM admitted 7/31 for septic shock in the setting of perforated viscus.  Presented to ED via EMS c/o approximately 2 days of severe abdominal pain, nausea and vomiting. He was in town from [place] for the [employer]  ED work- up revealed  B/P 80/42, HR 112, Afebrile, Leukocytosis 16.6, lactic acidosis 10,  AKI, Cr 4.06. CT A/P demonstrated perforated viscous. Received 2L LR, started on Levophed, Vancomycin/Zosyn. Surgery consulted & to OR for emergent exploratory laparotomy.  Did not reveal perforation but fluid collection in the right upper quadrant.  Cultures were sent and back with E. coli pansensitive.  Blood cultures done on 7/31+ for pansensitive E. coli 2/2 LAC.  Patient transferred to ICU post op early 7/31.  Increasing pressor needs, shock liver, thrombocytopenia, coagulopathic, anuric AKI with developing hyperkalemia and started on CHHF. Stress dose steroids and thiamine added for refractory shock. Abx changed to Vanc and merrem.   On 8/1 patient developed VT episodes with pulse, shocked x 3 and amio boluses 150 mg each x 3 fallowed by  amio drip. Cardio consulted.  On 8/3  He was intubated and sedated and 2 pressors.   Patient remains intubated and sedated today.  He has been found found to have liver abscesses on CT abd, plan to place drain today by IR.  Patient is on lower dose of pressors.  Antimicrobial therapy  to date  Cefepime x 1 dose         7/31  Zosyn  x 1 dose              7/31  Vancomycin x 1 dose     7/31  Metronidazole                 7/31-8/2 and 8/3-8/4  Ceftriaxone                     8/2-8/4  Eraxis                            7/31- 8/4    Patient improved and was downgraded to stepdown on 8/11  But became acutely hypotensive and unresponsive with change of rhythm from NSR to A-fib on 8/13.  Patient became hypotensive and unresponsive.  He was transferred to ICU and  intubated.  Patient was also started on pressors.    Patient seen today.  Remains intubated , awake.  Pressor requirements  less.  D/w Dr. Salena Saner, D/w RN events of the day.  JP-clear drainage  Liver drain-purulent creamy material    History reviewed. No pertinent past medical history.    Past Surgical History:   Procedure Laterality Date    BLADDER SURGERY N/A 06/01/2022    ENDOSCOPY OF ILEAL CONDUIT performed by Guinevere Ferrari, MD at [facility]    CT VISCERAL PERCUTANEOUS DRAIN  06/05/2022    CT VISCERAL PERCUTANEOUS DRAIN 06/05/2022 [facility] CT    LAPAROSCOPY N/A 06/01/2022    LAPAROSCOPY DIAGNOSTIC performed by Guinevere Ferrari, MD at [facility]    LAPAROTOMY N/A 06/01/2022    LAPAROTOMY EXPLORATORY performed by Guinevere Ferrari, MD at [facility]       Allergies   Allergen Reactions    Augmentin [Amoxicillin-Pot Clavulanate] Hives     Tolerated ceftriaxone 06/2022    Codeine      Unknown reaction       Social Connections: Not on file       No family status information on file.           Review of Systems - Negative except those mentioned in H&P      PHYSICAL EXAM:  General:          Awake, in no distress  EENT:              EOMI. Anicteric sclerae. MMM  Resp:               CTA bilaterally, no wheezing or rales.  No accessory muscle use  CV:                  Regular  rhythm,  No edema  GI:                   Soft, Non distended, Non tender.  +Bowel sounds  Neurologic:      Alert and oriented X 3, normal speech,   Psych:             Good insight. Not anxious nor  agitated  Skin:                No rashes.  No jaundice.  Extremities :  No edema, discoloration of  toes++.     Waymon Amato, MD [facility]

## 2022-06-16 NOTE — Other (Signed)
06/16/22 1520   Treatment   $CRRT $Yes   Machine #   [id number])   Cartridge Lot #   [phone])   Therapy Type CVVHDF   Pressures   Access (mmHg) (!) -20 mmHg   Return/Venous (mmHg) (!) 42 mmHg   Effluent (mmHg) (!) 47 mmHg   Filter (mmHg) 119 mmHg   TMP Pressure (mmHg) 20 mmHg   Pressure Drop (mmHg) 43 mmHg   Deaeration Chamber Check Yes   Flow Rates   Therapy Fluid (L/hr) 2800 L/hr   Blood Flow (mL/min) 150 mL/min  (ORDER CHANGE to 188ml/min)   Replacement Filter Post-Filter (mL/hr) 700 mL/hr   Dialysate (mL/hr) 1400 mL/hr   Pre-Blood Pump (mL/hr) 700 mL/hr   System Used   System Used Prisma   CRRT Activities   Temporary Disconnect New Cartridge- Clogged  (visible clot in deaeration chamber)   Ultrafiltrate Assessment   Ultrafiltrate Color Yellow/straw   Ultrafiltrate Appearance Clear   Hemodialysis Central Access Right Neck   Placement Date/Time: 06/09/22 1830   Present on Admission/Arrival: No  Inserted by: dr. Rutherford Limerick  Orientation: Right  Access Location: Neck   Continued need for line? Yes   Site Assessment Clean, dry & intact   Venous Lumen Status Infusing   Arterial Lumen Status Infusing   Line Care Connections checked and tightened;Ports disinfected   Dressing Type Bacteriocidal;Transparent   Date of Last Dressing Change 06/16/22   Dressing Status Clean, dry & intact   Dressing Change Due 06/19/22     Primary RN SBAR: Edythe Clarity, RN  Patient Education: Unable/intubated    Patient orders, notes, labs and code status reviewed. Time out complete. On site to change bfr as per MD order to 161ml/min. Deaeration chamber showing signs of clotting, all possible blood returned. CVVHDF restarted as per policy. Lines are visible, REVERSED and secure with thermax set at [facility]. Education pre/post with primary RN.     Hepatitis B Surface Ag   Date/Time Value Ref Range Status   06/01/2022 10:08 PM <0.10 Index Final     Hep B S Ab   Date/Time Value Ref Range Status   06/01/2022 10:08 PM <3.10 mIU/mL Final        06/16/22 1520    Treatment   Time On 1520   Observations & Evaluations   RUE Edema +3   LUE Edema +3   RLE Edema +2   LLE Edema +2   Technical Checks   ICEBOAT I;C;E;B;O;A;T   Hemodialysis Central Access Right Neck   Placement Date/Time: 06/09/22 1830   Present on Admission/Arrival: No  Inserted by: dr. Rutherford Limerick  Orientation: Right  Access Location: Neck   Continued need for line? Yes   Site Assessment Clean, dry & intact   Venous Lumen Status Infusing   Arterial Lumen Status Infusing   Line Care Connections checked and tightened;Ports disinfected   Dressing Type Bacteriocidal;Transparent   Date of Last Dressing Change 06/16/22   Dressing Status Clean, dry & intact   Dressing Change Due 06/19/22

## 2022-06-16 NOTE — Progress Notes (Signed)
EP/ ARRHYTHMIA Progress Note    Patient ID:  Patient: Jaime [name]  MRN: [id number]  Age: 76 y.o.  DOB: 01-Nov-1946    Date of  Admission: 05/31/2022 11:25 PM   PCP:  No primary care provider on file.  Usual cardiologist:  Jodelle Red, MD in [facility] [phone]    Assessment:   Sustained monomorphic VT but also with some salvoes earlier this admission when he was quite sick.  Suspect this is ectopic and due to critical illness and catecholamines.  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  Moderate aortic stenosis by outside echo 2020, limited study done here on 8/1.  Sepsis with GNR (E. Coli).  Diagnostic laparoscopy, laparotomy, EGD on 7/31.  IR drain to hepatic abscesses 8/4.  Multiorgan dysfunction, shock.  Severe thrombocytopenia, improved.  Full code.    Plan:     Continue amiodarone by NGT.    Supportive care from a cardiac standpoint.      [x]        High complexity decision making was performed in this patient at high risk for decompensation with multiple organ involvement.    Jaime [name] is a 76 y.o. male with a history of the above.  I was asked to consult due to recurrent VT requiring repeated amiodarone and shock attempts.     History reviewed. No pertinent past medical history.     Past Surgical History:   Procedure Laterality Date    BLADDER SURGERY N/A 06/01/2022    ENDOSCOPY OF ILEAL CONDUIT performed by 06/03/2022, MD at [facility]    CT VISCERAL PERCUTANEOUS DRAIN  06/05/2022    CT VISCERAL PERCUTANEOUS DRAIN 06/05/2022 [facility] RAD CT    LAPAROSCOPY N/A 06/01/2022    LAPAROSCOPY DIAGNOSTIC performed by 06/03/2022, MD at [facility]    LAPAROTOMY N/A 06/01/2022    LAPAROTOMY EXPLORATORY performed by 06/03/2022, MD at [facility]       Social History     Tobacco Use    Smoking status: Not on file    Smokeless tobacco: Not on file   Substance Use Topics    Alcohol use: Not on file        No family history on file.     Allergies   Allergen Reactions     Augmentin [Amoxicillin-Pot Clavulanate] Hives     Tolerated ceftriaxone 06/2022    Codeine      Unknown reaction          Current Facility-Administered Medications   Medication Dose Route Frequency    dexmedetomidine (PRECEDEX) 400 mcg in sodium chloride 0.9 % 100 mL infusion  0.1-1.5 mcg/kg/hr IntraVENous Continuous    amiodarone (CORDARONE) 450 mg in dextrose 5 % 250 mL infusion (Vial2Bag)  0.5 mg/min IntraVENous Continuous    DAPTOmycin (CUBICIN) 500 mg in sodium chloride 0.9 % 50 mL IVPB  6 mg/kg (Adjusted) IntraVENous Q24H    norepinephrine (LEVOPHED) 16 mg in sodium chloride 0.9 % 250 mL infusion  0.5-20 mcg/min IntraVENous Continuous    prismaSol BGK 2/3.5 dialysis solution   Dialysis Continuous    meropenem (MERREM) 1,000 mg in sodium chloride 0.9 % 100 mL IVPB (mini-bag)  1,000 mg IntraVENous Q12H    acidophilus probiotic capsule 1 capsule  1 capsule Oral Daily    vasopressin 20 Units in sodium chloride 0.9 % 100 mL infusion  0.03 Units/min IntraVENous Continuous    alcohol 62% (NOZIN) nasal sanitizer 1 ampule  1 ampule Topical BID  bisacodyl (DULCOLAX) suppository 10 mg  10 mg Rectal Daily PRN    insulin lispro (HUMALOG) injection vial 0-8 Units  0-8 Units SubCUTAneous TID WC    insulin lispro (HUMALOG) injection vial 0-4 Units  0-4 Units SubCUTAneous Nightly    albumin human 25% IV solution 25 g  25 g IntraVENous PRN    amiodarone (CORDARONE) tablet 400 mg  400 mg Oral Daily    heparin (porcine) injection 1,200 Units  1,200 Units IntraCATHeter PRN    And    heparin (porcine) injection 1,000 Units  1,000 Units IntraCATHeter PRN    polyethylene glycol (GLYCOLAX) packet 17 g  17 g Oral Daily    sennosides-docusate sodium (SENOKOT-S) 8.6-50 MG tablet 2 tablet  2 tablet Oral BID    heparin (porcine) injection 5,000 Units  5,000 Units SubCUTAneous 3 times per day    lansoprazole (PREVACID SOLUTAB) disintegrating tablet 30 mg  30 mg Per NG tube Daily    hydrALAZINE (APRESOLINE) injection 10 mg  10 mg  IntraVENous Q4H PRN    sodium chloride 0.9 % bolus 100 mL  100 mL IntraVENous PRN    fentaNYL (SUBLIMAZE) injection 50 mcg  50 mcg IntraVENous Q1H PRN    balsum peru-castor oil (VENELEX) ointment   Topical BID    glucose chewable tablet 16 g  4 tablet Oral PRN    dextrose bolus 10% 125 mL  125 mL IntraVENous PRN    Or    dextrose bolus 10% 250 mL  250 mL IntraVENous PRN    glucagon (rDNA) injection 1 mg  1 mg SubCUTAneous PRN    dextrose 10 % infusion   IntraVENous Continuous PRN    sodium chloride flush 0.9 % injection 5-40 mL  5-40 mL IntraVENous 2 times per day    sodium chloride flush 0.9 % injection 5-40 mL  5-40 mL IntraVENous PRN    0.9 % sodium chloride infusion   IntraVENous PRN    acetaminophen (TYLENOL) tablet 650 mg  650 mg Oral Q6H PRN    Or    acetaminophen (TYLENOL) suppository 650 mg  650 mg Rectal Q6H PRN    ondansetron (ZOFRAN) injection 4 mg  4 mg IntraVENous Q6H PRN       Review of Symptoms:  He cannot communicate.     Objective:      Physical Exam:  Temp (24hrs), Avg:100 F (37.8 C), Min:99.2 F (37.3 C), Max:100.5 F (38.1 C)    Patient Vitals for the past 8 hrs:   Pulse   06/16/22 1745 66   06/16/22 1730 66   06/16/22 1715 68   06/16/22 1709 68   06/16/22 1700 79   06/16/22 1645 77   06/16/22 1630 74   06/16/22 1615 65   06/16/22 1600 68   06/16/22 1545 74   06/16/22 1530 72   06/16/22 1515 98   06/16/22 1500 95   06/16/22 1445 (!) 112   06/16/22 1430 (!) 114   06/16/22 1415 (!) 115   06/16/22 1400 (!) 109   06/16/22 1345 (!) 120   06/16/22 1330 (!) 105   06/16/22 1315 (!) 113   06/16/22 1300 (!) 124   06/16/22 1245 (!) 112   06/16/22 1230 (!) 109   06/16/22 1215 (!) 130   06/16/22 1210 (!) 143   06/16/22 1209 (!) 130   06/16/22 1205 (!) 128   06/16/22 1200 (!) 133   06/16/22 1150 (!) 135   06/16/22 1149 (!) 148  06/16/22 1148 (!) 133   06/16/22 1147 (!) 127   06/16/22 1146 (!) 127   06/16/22 1145 (!) 136   06/16/22 1144 (!) 138   06/16/22 1143 99   06/16/22 1142 98   06/16/22 1141 90    06/16/22 1135 90   06/16/22 1130 87   06/16/22 1115 87   06/16/22 1100 92   06/16/22 1045 100   06/16/22 1030 96   06/16/22 1015 99   06/16/22 1000 93      Patient Vitals for the past 8 hrs:   Resp   06/16/22 1745 16   06/16/22 1730 16   06/16/22 1715 18   06/16/22 1709 16   06/16/22 1700 16   06/16/22 1645 19   06/16/22 1630 18   06/16/22 1615 (!) 0   06/16/22 1600 16   06/16/22 1545 16   06/16/22 1530 13   06/16/22 1515 14   06/16/22 1500 17   06/16/22 1445 18   06/16/22 1430 16   06/16/22 1415 18   06/16/22 1400 18   06/16/22 1345 17   06/16/22 1330 13   06/16/22 1315 13   06/16/22 1300 22   06/16/22 1245 19   06/16/22 1230 18   06/16/22 1215 20   06/16/22 1210 (!) 37   06/16/22 1209 26   06/16/22 1205 (!) 46   06/16/22 1200 (!) 52   06/16/22 1150 24   06/16/22 1148 24   06/16/22 1147 (!) 33   06/16/22 1146 27   06/16/22 1145 23   06/16/22 1144 (!) 32   06/16/22 1143 19   06/16/22 1142 15   06/16/22 1141 21   06/16/22 1135 25   06/16/22 1130 21   06/16/22 1115 19   06/16/22 1100 22   06/16/22 1045 21   06/16/22 1030 28   06/16/22 1015 (!) 31   06/16/22 1000 28      Patient Vitals for the past 8 hrs:   BP   06/16/22 1745 (!) 126/50   06/16/22 1730 (!) 115/56   06/16/22 1715 (!) 120/50   06/16/22 1700 (!) 131/49   06/16/22 1645 (!) 119/52   06/16/22 1630 (!) 122/49   06/16/22 1615 (!) 122/54   06/16/22 1600 (!) 105/50   06/16/22 1545 (!) 101/51   06/16/22 1530 132/61   06/16/22 1515 108/61   06/16/22 1500 133/72   06/16/22 1445 (!) 148/125   06/16/22 1430 98/79   06/16/22 1415 (!) 79/67   06/16/22 1400 99/60   06/16/22 1345 (!) 87/54   06/16/22 1330 (!) 96/52   06/16/22 1315 (!) 94/47   06/16/22 1300 (!) 92/58   06/16/22 1245 (!) 61/43   06/16/22 1230 (!) 90/50   06/16/22 1215 (!) 93/54   06/16/22 1210 (!) 77/51   06/16/22 1205 (!) 71/45   06/16/22 1200 109/88   06/16/22 1145 (!) 113/58   06/16/22 1135 (!) 145/52   06/16/22 1130 (!) 96/43   06/16/22 1115 (!) 93/45   06/16/22 1100 (!) 116/49   06/16/22 1045 (!)  126/56   06/16/22 1030 (!) 159/53   06/16/22 1015 (!) 176/58   06/16/22 1000 (!) 170/54          Intake/Output Summary (Last 24 hours) at 06/16/2022 1757  Last data filed at 06/16/2022 1700  Gross per 24 hour   Intake 1467.45 ml   Output 1779.5 ml   Net -312.05 ml  Nondiaphoretic, not in acute distress.  Unlabored, clear to auscultation bilaterally anteriorly, symmetric air movement.  Regular rate and rhythm, +soft systolic murmur, no pericardial rub.  No peripheral edema.  Extremities without cyanosis or clubbing.  Muscle tone and bulk normal for age.  Skin warm and dry.  Reduced perfusion to toes.  Sedated and on the ventilator.    CARDIOGRAPHICS and STUDIES, I reviewed:    Telemetry:  SINUS RHYTHM.    ECG:  Studies reviewed from the last 24 hours.    Echo 8/1:  Left Ventricle Normal left ventricular systolic function with a visually estimated EF of 55 - 60%. Not well visualized. Left ventricle size is normal. Increased wall thickness. Unable to assess wall motion. Diastolic dysfunction present with normal LV EF.   Left Atrium Not well visualized.   Right Ventricle Not well visualized. Normal systolic function.   Right Atrium Not well visualized.   Aortic Valve Not well visualized. No regurgitation. No stenosis.   Mitral Valve Not well visualized. No regurgitation. No stenosis noted.   Tricuspid Valve Not well visualized. No regurgitation. No stenosis noted.   Pulmonic Valve The pulmonic valve was not well visualized.   Aorta Not well visualized. Normal sized sinus of Valsalva.   Pericardium No pericardial effusion.       Labs:  No results for input(s): CPK, CKMB in the last 72 hours.    Invalid input(s): CPKMB, CKNDX, TROIQ  No results found for: CHOL, CHOLX, CHLST, CHOLV, HDL, HDLC, LDL, LDLC, TGLX, TRIGL  Recent Labs     06/14/22  0646   INR 1.6*        Recent Labs     06/14/22  0245 06/15/22  0403 06/16/22  0344 06/16/22  0926   NA 133* 132*  --  133*   K 5.3* 5.6*  --  4.1   CL 101 100  --  101   CO2 21  20*  --  24   BUN 64* 86*  --  43*   PHOS  --  8.6*  --  4.0   WBC 23.0* 21.7* 19.2*  --    HGB 8.7* 8.0* 7.1*  --    HCT 26.8* 24.3* 21.7*  --    PLT 290 270 224  --        Recent Labs     06/14/22  0245 06/14/22  0646   GLOB 4.2* 3.1       No components found for: GLPOC  No results for input(s): PH, PCO2, PO2 in the last 72 hours.      Tamera Punt, MD 06/16/22 5:57 PM

## 2022-06-16 NOTE — Progress Notes (Signed)
Physical Therapy    Pt remains intubated and sedated. Will defer and continue to follow.    Gershon Mussel, PT, DPT

## 2022-06-16 NOTE — Progress Notes (Addendum)
1900: Received bedside and verbal report from [place], Charity fundraiser. Patient is on mechanical ventilator with the following settings: AC/VC mode, FiO2 50, TV 500, RR 16 and PEEP 5. Patient has ongoing Amiodarone drip 0.5mg /min, Levophed 52mcg/min, and Precedex 0.68mcg/kg/hour. Patient is on CRRT on CVVHDF mode, 0 factor.    1930: MAP > , titrated Levophed accordingly.    2000: Complex assessment done, see flowsheet for details. Patient is currently on sinus rhythm, afebrile and stable blood pressure on Levophed gtt.    0000: Reassessment done, no change to previous assessment.    0309: Labs collected for CBC, BMP, Hepatic Function Panel, Mg and Phos.    0400: Reassessment done, no change to previous assessment.    0700: Bedside and verbal report given to Valentina Gu, Charity fundraiser.

## 2022-06-16 NOTE — Progress Notes (Addendum)
[facility]    ICU TEAM Progress Note    Name: Jaime [name]   DOB: 1945/12/25   MRN: [id number]   Date: 06/16/2022        Subjective:     Reason for ICU Admission: respiratory failure     HPI: 76 y/o male ([facility] from [place]) with PMHx significant of HTN, HLD and T2DM. Came in ED 7/30 noon for N/V/Abd pain and generalized weakess. Admitted for intra abdominal septic shock.  S/p ex lap that showed fluid collection in right upper quadrant.  Pt in the ICU 7/31-8/11.  Started on broad spectrum antibiotics.  BC grew enterobacter and e coli.  Being followed by ID. Hosp c/c/b episodes of V-tach and worsening AKI requiring CRRT.  Improved and downgraded to step down unit on 8/11.  Early in the morning on 8/13 found to be acutely hypotensive and unresponsive  Patient was transferred to ICU and intubated    8/14: pressor requirement is slowly declining  Remains intubated and sedated   Now on fio2 40%    8/15: started on CVVHD yesterday, pressors being tapered    Past Medical History:      has no past medical history on file.    Past Surgical History:      has a past surgical history that includes laparotomy (N/A, 06/01/2022); laparoscopy (N/A, 06/01/2022); Bladder surgery (N/A, 06/01/2022); and CT DRAINAGE VISCERAL PERCUTANEOUS (06/05/2022).    Home Medications:     Prior to Admission medications    Medication Sig Start Date End Date Taking? Authorizing Provider   metFORMIN (GLUCOPHAGE) 500 MG tablet Take 1 tablet by mouth daily  Patient not taking: Reported on 06/08/2022   Yes Historical Provider, MD   metoprolol (LOPRESSOR) 100 MG tablet Take 1 tablet by mouth daily   Yes Historical Provider, MD   hydrALAZINE (APRESOLINE) 25 MG tablet Take 1 tablet by mouth 3 times daily  Patient not taking: Reported on 06/08/2022   Yes Historical Provider, MD   atorvastatin (LIPITOR) 40 MG tablet Take 1 tablet by mouth daily  Patient not taking: Reported on 06/08/2022   Yes Historical Provider, MD   losartan (COZAAR) 100 MG tablet Take 1  tablet by mouth daily  Patient not taking: Reported on 06/08/2022   Yes Historical Provider, MD   metFORMIN (GLUCOPHAGE-XR) 500 MG extended release tablet Take 1 tablet by mouth daily (with breakfast)  Patient not taking: Reported on 06/08/2022 03/20/22   Historical Provider, MD   hydroCHLOROthiazide (HYDRODIURIL) 25 MG tablet TAKE 1 TABLET BY MOUTH ONCE DAILY AS NEEDED  Patient not taking: Reported on 06/08/2022 03/17/22   Historical Provider, MD   metoprolol succinate (TOPROL XL) 100 MG extended release tablet TAKE 1 TABLET BY MOUTH ONCE DAILY WITH OR IMMEDIATELY FOLLOWING A MEAL 03/10/22   Historical Provider, MD       Allergies/Social/Family History:     Allergies   Allergen Reactions    Augmentin [Amoxicillin-Pot Clavulanate] Hives     Tolerated ceftriaxone 06/2022    Codeine      Unknown reaction      Social History     Tobacco Use    Smoking status: Not on file    Smokeless tobacco: Not on file   Substance Use Topics    Alcohol use: Not on file      No family history on file.    Review of Systems:     Review of systems not obtained due to patient factors.  Objective:   Vital Signs:  BP (!) 126/56   Pulse 100   Temp 99.7 F (37.6 C) (Axillary)   Resp 21   Ht 1.727 m (5\' 8" )   Wt 107.7 kg (237 lb 7 oz)   SpO2 95%   BMI 36.10 kg/m      Temp (24hrs), Avg:99.1 F (37.3 C), Min:96.9 F (36.1 C), Max:100.1 F (37.8 C)           Intake/Output:     Intake/Output Summary (Last 24 hours) at 06/16/2022 1102  Last data filed at 06/16/2022 1000  Gross per 24 hour   Intake 1137.68 ml   Output 1137.3 ml   Net 0.38 ml       Physical Exam:  Intubated,sedated  HEENT: dry mucus membranes  Heart: s1,s2  Lungs: poor air entry b/l  Abd: soft. Liver abscess drain  Ext: no edema  Cns: no focal deficit. Off sedated awake and seems to follow commands    LABS AND  DATA: Personally reviewed  Recent Labs     06/15/22  0403 06/16/22  0344   WBC 21.7* 19.2*   HGB 8.0* 7.1*   HCT 24.3* 21.7*   PLT 270 224     Recent Labs     06/15/22  0403  06/16/22  0926   NA 132* 133*   K 5.6* 4.1   CL 100 101   CO2 20* 24   BUN 86* 43*   MG 2.9* 2.2   PHOS 8.6* 4.0     Recent Labs     06/14/22  0245 06/14/22  0646   GLOB 4.2* 3.1     Recent Labs     06/14/22  0646   INR 1.6*      Invalid input(s): PHI, PCO2I, PO2I, FIO2I  No results for input(s): CPK, CKMB in the last 72 hours.    Invalid input(s): TROIQ, BNPP    Hemodynamics:   PAP:   CO:     [place]: Discharge Planning  Living Arrangements: Spouse/Significant Other (06/05/22 1005)  Support Systems: Spouse/Significant Other;Family Members (06/05/22 1005)  Type of Residence: House (06/05/22 1005)  Potential Assistance Needed: N/A (06/05/22 1005)  Patient expects to be discharged to:: Unknown (06/05/22 1005) CI:         MEDS: Reviewed    Chest X-Ray: pul edema on 8/13      ECHO: 8/1 : normal EF    Multidisciplinary Rounds Completed:  Pending    ABCDEF Bundle/Checklist Completed:  Yes      ICU Assessment/ Comprehensive Plan of Care:     Jaime [name] Is a 76 y.o. male with ESRD on HD who is admitted for liver abscess, now respiratory failure.    NEURO  Switch to precedex for weaning and extubation    CARDIAC  Taper off levophed as tolerated  8/1 : ef was normal    RESPIRATORY  Vent support  Pulmonary toilet  No significant secretions  SAT and SBT today    RENAL  On CVVD    Foley Catheter Present: No  IVFs: NS bolus 250 cc  x1    GASTROINTESTINAL  Liver drain  Tolerating trickle feeds so far  JP drain clear  Liver drain still has purulent material    ID  F/u cultures  Ct abdomen has revealed pancreatitis : continue meropenem for now  ID folowing the case    ENDOCRINE  SSI    MUSCULOSKELETAL  scd's    Mobility: Poor  PT/OT:  PT consulted and on board and OT consulted and on board       DVT Prophylaxis: SCD's or Sequential Compression Device , enoxaparin  U - Ulcer Prophylaxis:   G - Glycemic Control: Insulin  B - Bowel Regimen:   Tubes: ETT  Lines: Central Line and [place]  Drains: Jackson-Pratt Drain  (JP)    DISPOSITION/COMMUNICATION  Discussed Plan of Care/Code Status: Full Code  Stay in ICU    CRITICAL CARE CONSULTANT NOTE  I had a face to face encounter with the patient, reviewed and interpreted patient data including clinical events, labs, images, vital signs, I/O's, and examined patient.  I have discussed the case and the plan and management of the patient's care with the consulting services, the bedside nurses and the respiratory therapist.      NOTE OF PERSONAL INVOLVEMENT IN CARE   This patient has a high probability of imminent, clinically significant deterioration, which requires the highest level of preparedness to intervene urgently. I participated in the decision-making and personally managed or directed the management of the following life and organ supporting interventions that required my frequent assessment to treat or prevent imminent deterioration.    I personally spent 40 minutes of critical care time.  This is time spent at this critically ill patient's bedside actively involved in patient care as well as the coordination of care.  This does not include any procedural time which has been billed separately.    Juliet Rude, MD  Intensivist  06/16/2022

## 2022-06-16 NOTE — Wound Image (Addendum)
Wound care consult for the "DTI / stage 2 excoriation sacrum and buttocks"  that was noted on admission to the CCU on 06-14-22.    Pt. Was initially admitted on 06/01/22 for abdominal pain with perforated viscus and sepsis with acute kidney injury as a result. Pt. Is from [place] and was only her for an event.   Pt. Is post op from the surgery done on admission as an emergent case.     Past Surgical History:   Procedure Laterality Date    BLADDER SURGERY N/A 06/01/2022    ENDOSCOPY OF ILEAL CONDUIT performed by Guinevere Ferrari, MD at [facility]    CT VISCERAL PERCUTANEOUS DRAIN  06/05/2022    CT VISCERAL PERCUTANEOUS DRAIN 06/05/2022 [facility] RAD CT    LAPAROSCOPY N/A 06/01/2022    LAPAROSCOPY DIAGNOSTIC performed by Guinevere Ferrari, MD at [facility]    LAPAROTOMY N/A 06/01/2022    LAPAROTOMY EXPLORATORY performed by Guinevere Ferrari, MD at [facility]     He has been on three units including the ED, Critical care initially after the surgery from 7/31 - 8/11, then Cardiopulmonary unit from 06/12/22 - 06/14/22.  The lesions were noted in the CCU on admission back to them. The skin is dark purple / maroon on the buttocks   Buttocks and sacrum: DTI   Pressure injury.       Right foot / toes: Necrotic      Left foot / toes necrotic:       Pt. Is also on Vasopressors at this time which is also affecting his feet,which are purple, cold and has skin breakdown and skin necrosis. CCU nursing staff is using the Venelex ointment which has taken some of the inflammation away from the skin.   Plan: Apply the Venelex to the heels and to the sacrum and buttocks wounds and surrounding skin. Apply the Betadine to the toes and distal feet / necrotic skin per Dr. Ethelene Hal.   Turn patient with a significant enough turn to keep him off of the bony prominences effecting his skin. Float the heels and feet at all times.   Ronnald Collum, RN, BSN, [employer]

## 2022-06-16 NOTE — Progress Notes (Signed)
Physical Therapy    Duplicate orders received. Patient already on caseload, however we are deferring therapy at this time due to he is intubated and sedated. Will continue to follow.    Gershon Mussel, PT, DPT

## 2022-06-16 NOTE — Progress Notes (Signed)
Seen this morning.  On CVVHDF.  Hemodynamically tolerating well.  Full note to follow.  Unfortunately no labs resulted yet because of some unexpected issues.  Waiting on the lab results and we will accordingly manage prescription.

## 2022-06-16 NOTE — Progress Notes (Signed)
Palliative Medicine SW Note    Palliative SW stopped by to check in on patient. He was receiving care with assigned nurse and physicians. Will stop by tomorrow to offer support to patient and spouse.    Thank you for including Palliative team in the care of Mr. Jaime [name].    Natalia Leatherwood, LCSW  Palliative Medicine [phone])

## 2022-06-16 NOTE — Other (Signed)
[place]  [facility]  DIABETES MANAGEMENT CONSULT    Consulted by Provider for advanced nursing evaluation and care for inpatient blood glucose management.    Evaluation and Action Plan   This 76 year old Caucasian male was admitted with RUQ pain and underwent laparotomy 06/01/22. Was in septic shock with multi-organ failure. Several episodes of Vtach. Remained on vent support, pressors & CRRT. A new finding of liver abscess noted on CT; drain placed with diminishing output. There is concern for LLE viability due to diminished pedal pulses. Ischemia of first 2 toes bilaterally. Per vascular, he has not been a candidate for endovascular or surgical revascularization at this time. Moved out of ICU but experienced an acute decline (hypotension, arrhythmia & AMS) 06/14/22 & was returned to ICU. Again intubated & sedated and on pressor support but these drugs are being tapered. Infectious disease following & making Abx recommendations. CT being entertained. CRRT per nephrology.    As for BG management in this patient with known Type 2 diabetes, began low dose basal insulin 06/02/22 along with corrective insulin to address impact of steroid. Bgs had been 170-180s but continued to rise. Insulin adjusted on 06/05/22. HC stopped 06/07/22. Bgs in target. High residuals resulted in TF being stopped; hence, did not required any insulin yesterday 06/11/22. Has not passed swallow test again 06/15/22. TF restarted at 10cc/hr. Bgs in 120-170s. Will use corrective approach until TF running @ goal rate or Bgs not in target.     Management Rationale Action Plan   Medication   Overriding steroid effect     As TF reaches goal rate of 30cc/hr (115 CHO/D), will need 10 units of total insulin/D - either 10 units of Lantus daily or 5 units of NPH twice daily   Corrective insulin approach for now       Additional orders               Initial Presentation   Jaime [name] is a 76 y.o. male admitted 05/31/22 from ER after  experiencing RUQ pain associated with generalized weakness, nausea & vomiting for several days PTA. AMS+. Hypotension+. Fever+. Dyspneic+. He is visiting from [place].  Ectopic atrial tachycardia  ER exam:  Cardiovascular:      Rate and Rhythm: Regular rhythm. Tachycardia present.   Pulmonary:      Comments: He is tachypneic, coarse breath sounds on inspiration at bilateral bases  Abdominal:      Palpations: Abdomen is soft.      Comments: Tender to palpation diffusely in the right sided abdomen without guarding or rebound tenderness   LAB: WBC 16.6. Normal H&H. Low platelets.BG 272/AG 17. AKI. Elevated liver enzymes. NT pro-BNP 20,980. Lipase >3000.     CXR:   Right IJ catheter satisfactory position without pneumothorax. ET   tube and NG tube as above   CT Head: Negative  CT ABd:   Extraluminal air bubbles in the right upper quadrant concerning for gastric   perforation. Inflammatory changes are noted about the tail of the pancreas   extending into the left anterior pararenal space, please correlate with any   evidence of acute pancreatitis. Bilateral lower lobe infiltrates.     JW:[id number] reviewed. No pertinent past medical history.     INITIAL DX: Septicemia (HCC) [A41.9]  Gastric perforation (HCC) [K25.5]  Perforated abdominal viscus [R19.8]     Current Treatment     TX: 06/01/22 LAPAROTOMY EXPLORATORY  LAPAROSCOPY DIAGNOSTIC ENDOSCOPY OF ILEAL CONDUIT  EGD  Hospital Course   Clinical progress has been complicated by multi-organ failure.   06/01/22 Dialysis  06/02/22 In septic shock with multi-organ failure. On vent support, sedation, pressor support and bicarb infusion. On Abx. Two runs of VT this morning requiring defibrillation; cardiology conversation anticipated. CRRT+  06/03/22 Remains on vent support C vent Fi02 50%/Peep 7. Low grade fever; on Abx. Steroids+. Afib/bradycardic. Pressors+. CRRT+.   06/04/22 Remains on vent support and pressors. On Abx. Concern for LLE. CRRT. CT: Enlarged left hepatic  abscess contain gas with other areas of probable abscess/phlegmonous infection.  06/05/22 On AC vent support. On Amio, vaso, levo & Fentanyl infusions. OG draining green fluid. CRRT+.  Vascular: Patient remains at risk for progressive deterioration and limb loss but needs to be weaned from pressor support before intervention is safe.  06/08/22 On Spon vent support; plans to extubate after CT today. Fentanyl pushes. H&H low. CRRT+. Mottled & blue toes bilaterally. Vascular AKI study to be completed  06/09/22 Alert. 02NC. Amio infusion+. Pedal pulses returning (no need to use doppler)  06/10/22 Alert. 02NC. NSR. Off pressors. TF running. Swallow eval today => may begin CL. Undergoing hemodialysis now. Continued cyanosis of bilateral toes.  06/11/22 Alert & conversational today  06/12/22 A&O. Failed swallow; allowed ice chips. Podiatry to see for LE ischemia  06/15/22 Intubated & sedated on pressor support. TF restarted at trickle rate. Dialysis planned per nephrology.  06/16/22 Eyes open. Intubated & sedated on Precedex. ST. Hypotensive on epi infusion. Amio+. TF running @10cc /hr. CVVH+.     Diabetes History   Type 2 diabetes on metformin therapy  Checks Bgs and they are in the 100s  Is physically active    Diabetes-related Medical History deferred    Diabetes Medication History - based on PTA list  Drug class Currently in use   Biguanide [x]  Metformin (Glucophage)  []  Metformin ER (Glucophage XL)     Diabetes self-management practices: deferred  Overall evaluation:    [x]  Unknown A1c     Subjective   NA     Objective   Physical exam  General Obese male who is ill-appearing  Neuro  Eyes open with conversation. Sedated  Vital Signs   Vitals:    06/16/22 1315   BP: (!) 94/47   Pulse: (!) 113   Resp: 13   Temp:    SpO2: 100%     Extremities    Diabetic foot exam:    Left Foot     Visual Exam: Cool & dry. Thickened toenails. 3 toes gangrenous    Pulse DP: +1   Right Foot   Visual Exam: Cool & dry. Thickened toenails . 4 toes  gangrenous   Pulse DP: +1      Laboratory  Recent Labs     06/14/22  0245 06/15/22  0403 06/16/22  0344   WBC 23.0* 21.7* 19.2*   HGB 8.7* 8.0* 7.1*   HCT 26.8* 24.3* 21.7*   MCV 93.7 92.0 95.2   PLT 290 270 224       Recent Labs     06/14/22  0245 06/15/22  0403 06/16/22  0926   NA 133* 132* 133*   K 5.3* 5.6* 4.1   CL 101 100 101   CO2 21 20* 24   PHOS  --  8.6* 4.0   BUN 64* 86* 43*   CREATININE 4.23* 5.65* 2.81*       Lab Results   Component Value Date    ALT 58  06/14/2022    AST 112 (H) 06/14/2022    ALKPHOS 188 (H) 06/14/2022    BILITOT 4.0 (H) 06/14/2022     No results found for: TSH, TSHREFLEX, TSHFT4, TSHELE, TSH3GEN, TSHHS   Lab Results   Component Value Date    LABA1C 7.7 (H) 06/02/2022     Factors impacting BG management  Factor Dose Comments   Nutrition:  TF   @target  rate of 30cc/hr  (115 CHO/D)   Running @10cc /hr   Pain Fent PRN    Infection Merrem Q12 hrs  Cubicin Q24 hrs WBC elevated but trending down   Other:   Kidney function  Liver function   AKI  Liver enzymes elevated   CRRT     Blood glucose pattern      Significant diabetes-related events  05/31/22 Admission BG 233  06/01/22 Dex 4mg  => BG 100s => HC started  06/02/22 Bgs into 200s  06/03/22 Bgs in 170-180s  06/04/22 Bgs rising abit. HC reduced frequency today. Trickle feeds to begin  06/05/22 Bgs rising into 200s  06/08/22 Bgs in target  06/09/22 Bgs rising with start of TF at higher rate  06/10/22 Bgs rose abit with TF @30cc /hr  06/11/22 Bgs 108, 113. No insulin given  06/12/22 No insulin given. No nutrition. AKI  06/14/22 Acute decline in health status after dialysis => back to ICU  06/15/22 TF restarted  06/16/22 TF still at 10cc/hr    Assessment and Nursing Intervention   Nursing Diagnosis Risk for unstable blood glucose pattern   Nursing Intervention Domain 5250 Decision-making Support   Nursing Interventions Examined current inpatient diabetes/blood glucose control   Explored factors facilitating and impeding inpatient management  Explored corrective  strategies with patient and responsible inpatient provider   Informed patient of rational for insulin strategy while hospitalized     Billing Code(s)   []  99233 IP subsequent hospital care - 50 minutes   [x]  08/12/22 IP subsequent hospital care - 35 minutes   []  99231 IP subsequent hospital care - 25 minutes   []  99221 IP initial hospital care - 40 minutes     Before making these care recommendations, I personally reviewed the hospitalization record, including notes, laboratory & diagnostic data and current medications, and examined the patient at the bedside (circumstances permitting) before determining care. More than fifty (50) percent of the time was spent in patient counseling and/or care coordination.  Total minutes: 35    06/16/22, APRN - CNS  Clinical Nurse Specialist - Diabetes & endocrine disorders  [facility]  Access via Perfect Serve

## 2022-06-16 NOTE — Progress Notes (Signed)
Name: Jaime [name]   MRN: [id number]  DOB: 1946/06/02  06/16/2022 1:51 PM      Admit Date: 05/31/2022    Admit Diagnosis: Septicemia (HCC) [A41.9]  Gastric perforation (HCC) [K25.5]  Perforated abdominal viscus [R19.8]    Assessment/Plan:     Principal Problem:    Gastric perforation (HCC)  Active Problems:    Type 2 diabetes mellitus with hyperglycemia, without long-term current use of insulin (HCC)    Intestinal obstruction (HCC)    Severe sepsis (HCC)    Septic shock (HCC)    Escherichia coli septicemia (HCC)    Liver abscess    Pneumoperitoneum    Acute respiratory failure with hypoxia (HCC)    AKI (acute kidney injury) (HCC)    Multi-organ failure with heart failure (HCC)    Thrombocytopenia (HCC)    E coli bacteremia    Hepatic abscess    Peripheral arterial disease (HCC)    Hyperbilirubinemia    Gram negative sepsis (HCC)    Septicemia (HCC)    Aspiration pneumonia of both lower lobes (HCC)    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 *          06/16/22     Assessment:         AKI/ATN- anuric- Grade 3-started CVVHDF 06/15/2022  Hyperkalemia-resolved  Resp failure Vent Dependent-FiO2 40%  Septic shock-still on low-dose pressor  Hyponatremia-sodium to 133  Encephalopathy  Acute abdomen from abdominal viscus perforation    Liver abscess  S/p emergent exploratory laparotomy 06/01/22    Type 2 DM  Shock liver  Acute on chronic anemia with hemoglobin at 7.1      Discussion/MDM: Patient with multiple medical comorbidities, each with high likelihood for morbidity and mortality if left untreated.  Patient being treated with medication that requires intensive monitoring due to increased risk for toxicity. I have reviewed patient's presenting subjective and objective findings, as well as all laboratory studies, imaging studies, and vital signs to date as well as treatment rendered  and patient's response to those treatments.  In addition, prior medical, surgical and relevant social and family histories were reviewed.        Discussion:     CVVHDF started yesterday again.  Delivered dialysis dose 25 mL/kg/hr    iHD 8/12;  Right IJ quinton 8/8  CVVHD initiated 7/31 until 8/6;  Currently in shock and pressor dependent  Severe PAD with gangrene of toes  Worsening hyperkalemia with anuria    Plan:     Decrease blood flow to 150  Consider PRBC transfusion  Continue CVVHDF  No fluid removal still    Bryan Lemma, MD    Dr Dorthey Sawyer  Cell no- [phone]  Available on perfect serve.      Signed By: Bryan Lemma, MD     June 16, 2022         Subjective:   Hospital day 15   Intubated and sedated. Clinical events discussed with RN  On trickle feeds   BM-fecal management system in place  Review of Systems:  Review of systems not obtained due to patient factors.   Objective:    BP (!) 94/47   Pulse (!) 113   Temp 100.4 F (38 C) (Axillary)   Resp 13   Ht 1.727 m (5\' 8" )   Wt 107.7 kg (237 lb 7 oz)   SpO2 100%  BMI 36.10 kg/m     Physical Exam:  Physical Exam  Constitutional:       Appearance: He is ill-appearing.   HENT:      Head: Atraumatic.      Nose: No congestion or rhinorrhea.   Cardiovascular:      Rate and Rhythm: Normal rate and regular rhythm.   Pulmonary:      Comments: Ventilator dependent  Abdominal:      General: There is distension.   Skin:     Findings: Bruising and lesion present.   Neurological:      Mental Status: He is disoriented.      Access- R IJ Dialysis access  L ij tLC  Peripheral gangrene noted in some toes-no change.  Recent Results (from the past 24 hour(s))   POCT Glucose    Collection Time: 06/15/22  4:24 PM   Result Value Ref Range    POC Glucose 140 (H) 65 - 117 mg/dL    Performed by: Erasmo Downer N RN    POCT Glucose    Collection Time: 06/15/22  8:34 PM   Result Value Ref Range    POC Glucose 125 (H) 65 - 117 mg/dL    Performed by: Sharen Hint RN    CBC    Collection Time: 06/16/22  3:44 AM   Result Value Ref Range    WBC 19.2 (H) 4.1 - 11.1 K/uL    RBC 2.28 (L) 4.10 - 5.70 M/uL    Hemoglobin 7.1 (L) 12.1 - 17.0 g/dL    Hematocrit 16.1 (L) 36.6 - 50.3 %    MCV 95.2 80.0 - 99.0 FL    MCH 31.1 26.0 - 34.0 PG    MCHC 32.7 30.0 - 36.5 g/dL    RDW 09.6 (H) 04.5 - 14.5 %    Platelets 224 150 - 400 K/uL    MPV 11.6 8.9 - 12.9 FL    Nucleated RBCs 0.1 (H) 0 PER 100 WBC    nRBC 0.02 (H) 0.00 - 0.01 K/uL   POCT Glucose    Collection Time: 06/16/22  8:50 AM   Result Value Ref Range    POC Glucose 148 (H) 65 - 117 mg/dL    Performed by: Gayland Curry RN    Basic Metabolic Panel    Collection Time: 06/16/22  9:26 AM   Result Value Ref Range    Sodium 133 (L) 136 - 145 mmol/L    Potassium 4.1 3.5 - 5.1 mmol/L    Chloride 101 97 - 108 mmol/L    CO2 24 21 - 32 mmol/L    Anion Gap 8 5 - 15 mmol/L    Glucose 151 (H) 65 - 100 mg/dL    BUN 43 (H) 6 - 20 MG/DL    Creatinine 4.09 (H) 0.70 - 1.30 MG/DL    Bun/Cre Ratio 15 12 - 20      Est, Glom Filt Rate 23 (L) >60 ml/min/1.30m2    Calcium 8.5 8.5 - 10.1 MG/DL   Magnesium    Collection Time: 06/16/22  9:26 AM   Result Value Ref Range    Magnesium 2.2 1.6 - 2.4 mg/dL   Phosphorus    Collection Time: 06/16/22  9:26 AM   Result Value Ref Range    Phosphorus 4.0 2.6 - 4.7 MG/DL   EKG 12 Lead    Collection Time: 06/16/22 11:56 AM   Result Value Ref Range    Ventricular Rate 141 BPM  Atrial Rate 119 BPM    QRS Duration 154 ms    Q-T Interval 378 ms    QTc Calculation (Bazett) 578 ms    R Axis -7 degrees    T Axis 149 degrees    Diagnosis       Atrial fibrillation with rapid ventricular response with premature   ventricular or aberrantly conducted complexes  Left bundle branch block  Abnormal ECG  No previous ECGs available     POCT Glucose    Collection Time: 06/16/22 12:50 PM   Result Value Ref Range    POC Glucose 171 (H) 65 - 117 mg/dL    Performed by: Darlen Round RN           Intake/Output Summary (Last 24 hours) at  06/16/2022 1351  Last data filed at 06/16/2022 1300  Gross per 24 hour   Intake 1200.12 ml   Output 1373.9 ml   Net -173.78 ml            Data Review:   Recent Labs     06/14/22  0646 06/15/22  0403 06/16/22  0344 06/16/22  0926   NA  --    < >  --  133*   K  --    < >  --  4.1   BUN  --    < >  --  43*   CREATININE  --    < >  --  2.81*   WBC  --    < > 19.2*  --    HGB  --    < > 7.1*  --    HCT  --    < > 21.7*  --    PLT  --    < > 224  --    INR 1.6*  --   --   --     < > = values in this interval not displayed.         No current facility-administered medications on file prior to encounter.     Current Outpatient Medications on File Prior to Encounter   Medication Sig Dispense Refill    metFORMIN (GLUCOPHAGE) 500 MG tablet Take 1 tablet by mouth daily (Patient not taking: Reported on 06/08/2022)      metoprolol (LOPRESSOR) 100 MG tablet Take 1 tablet by mouth daily      hydrALAZINE (APRESOLINE) 25 MG tablet Take 1 tablet by mouth 3 times daily (Patient not taking: Reported on 06/08/2022)      atorvastatin (LIPITOR) 40 MG tablet Take 1 tablet by mouth daily (Patient not taking: Reported on 06/08/2022)      losartan (COZAAR) 100 MG tablet Take 1 tablet by mouth daily (Patient not taking: Reported on 06/08/2022)      metFORMIN (GLUCOPHAGE-XR) 500 MG extended release tablet Take 1 tablet by mouth daily (with breakfast) (Patient not taking: Reported on 06/08/2022)      hydroCHLOROthiazide (HYDRODIURIL) 25 MG tablet TAKE 1 TABLET BY MOUTH ONCE DAILY AS NEEDED (Patient not taking: Reported on 06/08/2022)      metoprolol succinate (TOPROL XL) 100 MG extended release tablet TAKE 1 TABLET BY MOUTH ONCE DAILY WITH OR IMMEDIATELY FOLLOWING A MEAL          Care Plan discussed with:  Patient     Family      RN x    Care Manager        Hospitalist    Consultant:  Comments   >50% of visit spent in counseling and coordination of care Y          Signed By: Bryan Lemma, MD     June 16, 2022       This dictation was done by  dragon, computer voice recognition software.  Often unanticipated grammatical, syntax, phones and other interpretive errors are inadvertently transcribed.  Please excuse errors that have escaped final proofreading. Please contact me if you suspect dictation or transcription errors.      Dr Dorthey Sawyer    Office No- [phone]  [facility]  [address]  [address]  [place], [place] [address]    Fax: [phone]

## 2022-06-16 NOTE — Progress Notes (Addendum)
Patient developed rapid a fib on SBT  SBT stopped  Loaded with amiodarone and amiodarone drip was started  Needed to increase levophed dose  Assessed patient at bedside  D/w RN and RT  D/w ID as well : unclear as to why he deteriorated on the floor  Repeat ct abdomen with oral contrast? When more stable  Total cumulative critical care time 100 minutes

## 2022-06-16 NOTE — Plan of Care (Signed)
Problem: Safety - Medical Restraint  Goal: Remains free of injury from restraints (Restraint for Interference with Medical Device)  Description: INTERVENTIONS:  1. Determine that other, less restrictive measures have been tried or would not be effective before applying the restraint  2. Evaluate the patient's condition at the time of restraint application  3. Inform patient/family regarding the reason for restraint  4. Q2H: Monitor safety, psychosocial status, comfort, nutrition and hydration  Outcome: Progressing

## 2022-06-16 NOTE — Other (Signed)
CRRT / [phone]    Primary RN SBAR: A.Guzman, RN  Patient Education: pt unable to educate - intubated/sedated, no family at bedside    Hepatitis B Surface Ag   Date/Time Value Ref Range Status   06/01/2022 10:08 PM <0.10 Index Final     Hep B S Ab   Date/Time Value Ref Range Status   06/01/2022 10:08 PM <3.10 mIU/mL Final       06/16/22 0130   Treatment   $CRRT $Yes   Machine #   [id number])   Cartridge Lot #   [id number])   Therapy Type CVVHDF   Pressures   Access (mmHg) (!) -45 mmHg   Return/Venous (mmHg) 72 mmHg   Filter (mmHg) 162 mmHg   TMP Pressure (mmHg) 18 mmHg   Pressure Drop (mmHg) 53 mmHg   Deaeration Chamber Check Yes   Flow Rates   Therapy Fluid (L/hr) 2800 L/hr   Blood Flow (mL/min) 200 mL/min   Replacement Filter Post-Filter (mL/hr) 700 mL/hr   Dialysate (mL/hr) 1400 mL/hr   Pre-Blood Pump (mL/hr) 700 mL/hr   System Used   System Used [employer] Calculation   (D) Physician Ordered Hourly Removal (mL) 0 ml   CRRT Activities   Intervention Initiated   Hemodialysis Central Access Right Neck   Placement Date/Time: 06/09/22 1830   Present on Admission/Arrival: No  Inserted by: dr. Rutherford Limerick  Orientation: Right  Access Location: Neck   Continued need for line? Yes   Site Assessment Clean, dry & intact   Venous Lumen Status Infusing   Arterial Lumen Status Infusing   Line Care Connections checked and tightened;Ports disinfected   Dressing Type Transparent;Antimicrobial   Date of Last Dressing Change 06/12/22   Dressing Status Clean, dry & intact     Davita RN at bedside to change filter d/t visible clotting in deaeration chamber, obstructing view. Patient, code status, labs, and orders verified. Consents on chart. Time out completed. Davita RN able to return all pt blood from circuit (186 mls.) Old set discarded in red biohazard bin. HF-1000 filter set-up, primed w/ 1L NS, tested and running well. Lines visible and connections secure with Thermax blood warmer to return line at 37*C. Education, pre and post  to primary RN.      Lines running reversed on start.    Primary RN SBAR: A.Guzman, RN    HR: 77  BP: 121/66

## 2022-06-16 NOTE — Progress Notes (Addendum)
Bedside shift change report given to [facility] RN/ Biomedical scientist (oncoming nurse) by Verdie Drown (offgoing nurse). Report included the following information Nurse Handoff Report, Adult Overview, Intake/Output, Recent Results, Cardiac Rhythm NSR, and Neuro Assessment.     0800 Shift assessment performed. Patient A&O to voice, follows commands and resting quietly. Propofol infusing at 5 mcg and Levo at 11 mcg. Continuous feeds infusing at 10 ml/hr with 50 ml water flush every 4 hrs.     0850 Propofol infusion stopped.     0900 Patient turned and mouth care performed.    1020 IDR completed. Daily goals include:  -SAT/SBT  - Continuous CRRT  - Wean off pressor    1139 Precedex infusion started at 0.2 mcg for ventilator tolerance. SBT failed.      1200 Reassessment performed. Patient resting quietly with even, unlabored respirations. Mouth care performed. Heart rhythm from NSR to Afib w/ RVR & Left bundle branch block. No complaints/symptoms reported by patient. Patient placed back on AC/VC.    1204 Amiodarone Bolus started at 300mg     1215 Order for Levo to run at 10 mcg/hr due to MAP below 65, BP 77/51 MAP 59 @ 1210    1232 Order for Levo to run at 15 mcg/hr due to MAP below 65, BP 90/50 MAP 63 @ 1232    1241 Amiodarone infusion infusing at 0.5 mg/min     1500 Continuous feeds increased to 20 ml/hr     1600 Reassessment performed. Patient resting quietly with even, unlabored respirations. Mouth care performed. NSR. Levo infusing at 17 mcg/hr, Precedex infusing at 0.2 mcg/hr, Amiodarone infusing at 0.5 mg/min.     1840 Levo infusing at 16 mcg/hr, Precedex infusing at 0.4 mcg/hr, Amiodarone infusing at 0.5 mg/min. Patient resting quietly in NSR with even, unlabored respirations.     1900 Shift report given to (oncoming nurse) by Comptroller RN / Jeanice Lim RN (offgoing nurse)

## 2022-06-17 ENCOUNTER — Inpatient Hospital Stay: Payer: MEDICARE

## 2022-06-17 LAB — POCT GLUCOSE
POC Glucose: 197 mg/dL — ABNORMAL HIGH (ref 65–117)
POC Glucose: 165 mg/dL — ABNORMAL HIGH (ref 65–117)
POC Glucose: 214 mg/dL — ABNORMAL HIGH (ref 65–117)
POC Glucose: 207 mg/dL — ABNORMAL HIGH (ref 65–117)

## 2022-06-17 LAB — EKG 12-LEAD
Atrial Rate: 119 {beats}/min
Q-T Interval: 378 ms
QRS Duration: 154 ms
QTc Calculation (Bazett): 578 ms
R Axis: -7 degrees
T Axis: 149 degrees
Ventricular Rate: 141 {beats}/min

## 2022-06-17 LAB — BASIC METABOLIC PANEL
Anion Gap: 6 mmol/L (ref 5–15)
BUN: 34 MG/DL — ABNORMAL HIGH (ref 6–20)
Bun/Cre Ratio: 18 (ref 12–20)
CO2: 26 mmol/L (ref 21–32)
Calcium: 8.7 MG/DL (ref 8.5–10.1)
Chloride: 103 mmol/L (ref 97–108)
Creatinine: 1.88 MG/DL — ABNORMAL HIGH (ref 0.70–1.30)
Est, Glom Filt Rate: 37 mL/min/{1.73_m2} — ABNORMAL LOW (ref >60)
Glucose: 194 mg/dL — ABNORMAL HIGH (ref 65–100)
Potassium: 3.7 mmol/L (ref 3.5–5.1)
Sodium: 135 mmol/L — ABNORMAL LOW (ref 136–145)

## 2022-06-17 LAB — CBC WITH AUTO DIFFERENTIAL
Absolute Immature Granulocyte: 0 10*3/uL (ref 0.00–0.04)
Basophils %: 0 % (ref 0–1)
Basophils Absolute: 0 10*3/uL (ref 0.0–0.1)
Eosinophils %: 0 % (ref 0–7)
Eosinophils Absolute: 0 10*3/uL (ref 0.0–0.4)
Hematocrit: 24.6 % — ABNORMAL LOW (ref 36.6–50.3)
Hemoglobin: 7.4 g/dL — ABNORMAL LOW (ref 12.1–17.0)
Immature Granulocytes: 0 % (ref 0.0–0.5)
Lymphocytes %: 10 % — ABNORMAL LOW (ref 12–49)
Lymphocytes Absolute: 1.8 10*3/uL (ref 0.8–3.5)
MCH: 30.3 PG (ref 26.0–34.0)
MCHC: 30.1 g/dL (ref 30.0–36.5)
MCV: 100.8 FL — ABNORMAL HIGH (ref 80.0–99.0)
MPV: 11.1 FL (ref 8.9–12.9)
Metamyelocytes: 1 %
Monocytes %: 5 % (ref 5–13)
Monocytes Absolute: 0.9 10*3/uL (ref 0.0–1.0)
Neutrophils %: 84 % — ABNORMAL HIGH (ref 32–75)
Neutrophils Absolute: 15.5 10*3/uL — ABNORMAL HIGH (ref 1.8–8.0)
Nucleated RBCs: 0.2 PER 100 WBC — ABNORMAL HIGH
Platelets: 235 10*3/uL (ref 150–400)
RBC: 2.44 M/uL — ABNORMAL LOW (ref 4.10–5.70)
RDW: 19.6 % — ABNORMAL HIGH (ref 11.5–14.5)
WBC: 18.4 10*3/uL — ABNORMAL HIGH (ref 4.1–11.1)
nRBC: 0.04 10*3/uL — ABNORMAL HIGH (ref 0.00–0.01)

## 2022-06-17 LAB — HEPATIC FUNCTION PANEL
ALT: 48 U/L (ref 12–78)
AST: 96 U/L — ABNORMAL HIGH (ref 15–37)
Albumin/Globulin Ratio: 0.5 — ABNORMAL LOW (ref 1.1–2.2)
Albumin: 1.9 g/dL — ABNORMAL LOW (ref 3.5–5.0)
Alk Phosphatase: 223 U/L — ABNORMAL HIGH (ref 45–117)
Bilirubin, Direct: 2.1 MG/DL — ABNORMAL HIGH (ref 0.0–0.2)
Globulin: 3.7 g/dL (ref 2.0–4.0)
Total Bilirubin: 2.7 MG/DL — ABNORMAL HIGH (ref 0.2–1.0)
Total Protein: 5.6 g/dL — ABNORMAL LOW (ref 6.4–8.2)

## 2022-06-17 LAB — ECHO (TTE) COMPLETE (PRN CONTRAST/BUBBLE/STRAIN/3D)
AV Peak Gradient: 9 mmHg
AV Peak Velocity: 1.5 m/s
Body Surface Area: 2.21 m2
MV A Velocity: 0.89 m/s
MV E Velocity: 0.79 m/s
MV E Wave Deceleration Time: 259.1 ms
MV E/A: 0.89

## 2022-06-17 LAB — MAGNESIUM: Magnesium: 2.1 mg/dL (ref 1.6–2.4)

## 2022-06-17 LAB — PHOSPHORUS: Phosphorus: 3.1 MG/DL (ref 2.6–4.7)

## 2022-06-17 MED ORDER — INSULIN LISPRO 100 UNIT/ML IJ SOLN
100 UNIT/ML | Freq: Every evening | INTRAMUSCULAR | Status: AC
Start: 2022-06-17 — End: 2022-06-18

## 2022-06-17 MED ORDER — FLUCONAZOLE IN SODIUM CHLORIDE 200-0.9 MG/100ML-% IV SOLN
INTRAVENOUS | Status: DC
Start: 2022-06-17 — End: 2022-06-21
  Administered 2022-06-17 – 2022-06-20 (×4): 200 mg via INTRAVENOUS

## 2022-06-17 MED ORDER — MEROPENEM 1 G IV SOLR
1 | Freq: Three times a day (TID) | INTRAVENOUS | Status: DC
Start: 2022-06-17 — End: 2022-06-18
  Administered 2022-06-17 – 2022-06-18 (×4): 1000 mg via INTRAVENOUS

## 2022-06-17 MED ORDER — INSULIN LISPRO 100 UNIT/ML IJ SOLN
100 UNIT/ML | Freq: Three times a day (TID) | INTRAMUSCULAR | Status: DC
Start: 2022-06-17 — End: 2022-06-17
  Administered 2022-06-17 (×2): 1 [IU] via SUBCUTANEOUS

## 2022-06-17 MED ORDER — INSULIN GLARGINE 100 UNIT/ML SC SOLN
100 | Freq: Every evening | SUBCUTANEOUS | Status: DC
Start: 2022-06-17 — End: 2022-06-18
  Administered 2022-06-18: 01:00:00 10 [IU] via SUBCUTANEOUS

## 2022-06-17 MED FILL — FLUCONAZOLE IN SODIUM CHLORIDE 200-0.9 MG/100ML-% IV SOLN: INTRAVENOUS | Qty: 100

## 2022-06-17 MED FILL — HEPARIN SODIUM (PORCINE) 5000 UNIT/ML IJ SOLN: 5000 UNIT/ML | INTRAMUSCULAR | Qty: 1

## 2022-06-17 MED FILL — HEALTHYLAX 17 G PO PACK: 17 g | ORAL | Qty: 1

## 2022-06-17 MED FILL — DEXMEDETOMIDINE HCL 200 MCG/2ML IV SOLN: 200 MCG/2ML | INTRAVENOUS | Qty: 400

## 2022-06-17 MED FILL — MERREM 1 G IV SOLR: 1 g | INTRAVENOUS | Qty: 1000

## 2022-06-17 MED FILL — RISAQUAD-2 PO CAPS: ORAL | Qty: 1

## 2022-06-17 MED FILL — HUMALOG 100 UNIT/ML IJ SOLN: 100 UNIT/ML | INTRAMUSCULAR | Qty: 1

## 2022-06-17 MED FILL — AMIODARONE HCL 450 MG/9ML IV SOLN: 450 MG/9ML | INTRAVENOUS | Qty: 9

## 2022-06-17 MED FILL — AMIODARONE HCL 200 MG PO TABS: 200 MG | ORAL | Qty: 2

## 2022-06-17 MED FILL — NOREPINEPHRINE-SODIUM CHLORIDE 16-0.9 MG/250ML-% IV SOLN: INTRAVENOUS | Qty: 250

## 2022-06-17 MED FILL — DAPTOMYCIN 500 MG IV SOLR: 500 MG | INTRAVENOUS | Qty: 10

## 2022-06-17 MED FILL — SENNA PLUS 8.6-50 MG PO TABS: ORAL | Qty: 2

## 2022-06-17 NOTE — Progress Notes (Signed)
Name: Khris Jansson   MRN: [id number]  DOB: [birth year]  06/17/2022 8:06 AM      Admit Date: 05/31/2022    Admit Diagnosis: Septicemia ([place]) [A41.9]  Gastric perforation ([place]) [K25.5]  Perforated abdominal viscus [R19.8]    Assessment/Plan:     Principal Problem:    Gastric perforation ([place])  Active Problems:    Poorly controlled type 2 diabetes mellitus (HCC)    Intestinal obstruction (HCC)    Severe sepsis (HCC)    Septic shock (HCC)    E coli infection    Liver abscess    Pneumoperitoneum    Acute respiratory failure with hypoxia (HCC)    AKI (acute kidney injury) ([place])    Multi-organ failure with heart failure (HCC)    Thrombocytopenia (HCC)    E coli bacteremia    Hepatic abscess    Peripheral arterial disease (HCC)    Hyperbilirubinemia    Gram negative sepsis (HCC)    Septicemia (HCC)    Aspiration pneumonia of both lower lobes (HCC)    Gangrene of toe of both feet ([place])    Bandemia  Resolved Problems:    * No resolved hospital problems. *          06/17/22     Assessment:         AKI/ATN- anuric- Grade 3-started CVVHDF 06/15/2022  Resp failure Vent Dependent-FiO2 40%  Septic shock-still on low-dose pressor  Hyponatremia-sodium to 135  Encephalopathy  Acute abdomen from abdominal viscus perforation    Liver abscess  S/p emergent exploratory laparotomy 06/01/22    Type 2 DM  Shock liver  Acute on chronic anemia with hemoglobin at 7.1      Discussion/MDM: Patient with multiple medical comorbidities, each with high likelihood for morbidity and mortality if left untreated.  Patient being treated with medication that requires intensive monitoring due to increased risk for toxicity. I have reviewed patient's presenting subjective and objective findings, as well as all laboratory studies, imaging studies, and vital signs to date as well as treatment rendered and patient's response to those treatments.  In addition, prior medical, surgical and  relevant social and family histories were reviewed.        Discussion:     CVVHDF day 3.  Delivered dialysis dose 25 mL/kg/hr    iHD 8/12;  Right IJ [place] 8/8  CVVHD initiated 7/31 until 8/6;  Currently in shock and pressor dependent  Severe PAD with gangrene of toes  Worsening hyperkalemia with anuria    Plan:     Continue CVVHDF-same prescription; keep QB at 150  Hemoglobin 7.4/stable; if it drops below 7 consider transfusion  Albumin at 1.9.  If hypotensive events would suggest administer albumin    Caren Griffins, MD    Dr Elenor Legato  Cell no- [phone]  Available on perfect serve.      Signed By: Caren Griffins, MD     June 17, 2022         Subjective:   Hospital day 16   Intubated and sedated. Clinical events discussed with RN    Review of Systems:  Review of systems not obtained due to patient factors.   Objective:    BP (!) 142/55   Pulse 60   Temp 99.3 F (37.4 C) (Axillary)   Resp 17   Ht 1.727 m (5' 8" )   Wt 107.7 kg (237 lb 7 oz)   SpO2 100%   BMI 36.10 kg/m     Physical  Exam:  Physical Exam  Constitutional:       Appearance: He is ill-appearing.   HENT:      Head: Atraumatic.      Nose: No congestion or rhinorrhea.   Cardiovascular:      Rate and Rhythm: Normal rate and regular rhythm.   Pulmonary:      Comments: Ventilator dependent  Abdominal:      General: There is distension.   Skin:     Findings: Bruising and lesion present.   Neurological:      Mental Status: He is disoriented.      Access- R IJ Dialysis access  L ij tLC  Peripheral gangrene noted in some toes-no change.  Fecal management system in place  Recent Results (from the past 24 hour(s))   POCT Glucose    Collection Time: 06/16/22  8:50 AM   Result Value Ref Range    POC Glucose 148 (H) 65 - 117 mg/dL    Performed by: Teodoro Spray RN    Basic Metabolic Panel    Collection Time: 06/16/22  9:26 AM   Result Value Ref Range    Sodium 133 (L) 136 - 145 mmol/L    Potassium 4.1 3.5 - 5.1 mmol/L    Chloride 101 97 - 108  mmol/L    CO2 24 21 - 32 mmol/L    Anion Gap 8 5 - 15 mmol/L    Glucose 151 (H) 65 - 100 mg/dL    BUN 43 (H) 6 - 20 MG/DL    Creatinine 2.81 (H) 0.70 - 1.30 MG/DL    Bun/Cre Ratio 15 12 - 20      Est, Glom Filt Rate 23 (L) >60 ml/min/1.37m    Calcium 8.5 8.5 - 10.1 MG/DL   Magnesium    Collection Time: 06/16/22  9:26 AM   Result Value Ref Range    Magnesium 2.2 1.6 - 2.4 mg/dL   Phosphorus    Collection Time: 06/16/22  9:26 AM   Result Value Ref Range    Phosphorus 4.0 2.6 - 4.7 MG/DL   Triglyceride    Collection Time: 06/16/22  9:26 AM   Result Value Ref Range    Triglycerides 474 (H) <150 MG/DL   EKG 12 Lead    Collection Time: 06/16/22 11:56 AM   Result Value Ref Range    Ventricular Rate 141 BPM    Atrial Rate 119 BPM    QRS Duration 154 ms    Q-T Interval 378 ms    QTc Calculation (Bazett) 578 ms    R Axis -7 degrees    T Axis 149 degrees    Diagnosis       Atrial fibrillation with rapid ventricular response with premature   ventricular or aberrantly conducted complexes  Left bundle branch block  Abnormal ECG  No previous ECGs available     POCT Glucose    Collection Time: 06/16/22 12:50 PM   Result Value Ref Range    POC Glucose 171 (H) 65 - 117 mg/dL    Performed by: LTerrence DupontRN    Transthoracic echocardiogram (TTE) complete with contrast, bubble, strain, and 3D PRN    Collection Time: 06/16/22  4:25 PM   Result Value Ref Range    AV Peak Gradient 9 mmHg    AV Peak Velocity 1.5 m/s    MV A Velocity 0.89 m/s    MV E Wave Deceleration Time 259.1 ms    MV E Velocity 0.79 m/s  Body Surface Area 2.21 m2    MV E/A 0.89    POCT Glucose    Collection Time: 06/16/22  5:18 PM   Result Value Ref Range    POC Glucose 191 (H) 65 - 117 mg/dL    Performed by: Terrence Dupont RN    Phosphorus    Collection Time: 06/17/22  3:09 AM   Result Value Ref Range    Phosphorus 3.1 2.6 - 4.7 MG/DL   Magnesium    Collection Time: 06/17/22  3:09 AM   Result Value Ref Range    Magnesium 2.1 1.6 - 2.4 mg/dL   Basic Metabolic Panel     Collection Time: 06/17/22  3:09 AM   Result Value Ref Range    Sodium 135 (L) 136 - 145 mmol/L    Potassium 3.7 3.5 - 5.1 mmol/L    Chloride 103 97 - 108 mmol/L    CO2 26 21 - 32 mmol/L    Anion Gap 6 5 - 15 mmol/L    Glucose 194 (H) 65 - 100 mg/dL    BUN 34 (H) 6 - 20 MG/DL    Creatinine 1.88 (H) 0.70 - 1.30 MG/DL    Bun/Cre Ratio 18 12 - 20      Est, Glom Filt Rate 37 (L) >60 ml/min/1.36m    Calcium 8.7 8.5 - 10.1 MG/DL   CBC with Auto Differential    Collection Time: 06/17/22  3:09 AM   Result Value Ref Range    WBC 18.4 (H) 4.1 - 11.1 K/uL    RBC 2.44 (L) 4.10 - 5.70 M/uL    Hemoglobin 7.4 (L) 12.1 - 17.0 g/dL    Hematocrit 24.6 (L) 36.6 - 50.3 %    MCV 100.8 (H) 80.0 - 99.0 FL    MCH 30.3 26.0 - 34.0 PG    MCHC 30.1 30.0 - 36.5 g/dL    RDW 19.6 (H) 11.5 - 14.5 %    Platelets 235 150 - 400 K/uL    MPV 11.1 8.9 - 12.9 FL    Nucleated RBCs 0.2 (H) 0 PER 100 WBC    nRBC 0.04 (H) 0.00 - 0.01 K/uL    Neutrophils % 84 (H) 32 - 75 %    Lymphocytes % 10 (L) 12 - 49 %    Monocytes % 5 5 - 13 %    Eosinophils % 0 0 - 7 %    Basophils % 0 0 - 1 %    Metamyelocytes 1 %    Immature Granulocytes 0 0.0 - 0.5 %    Neutrophils Absolute 15.5 (H) 1.8 - 8.0 K/UL    Lymphocytes Absolute 1.8 0.8 - 3.5 K/UL    Monocytes Absolute 0.9 0.0 - 1.0 K/UL    Eosinophils Absolute 0.0 0.0 - 0.4 K/UL    Basophils Absolute 0.0 0.0 - 0.1 K/UL    Absolute Immature Granulocyte 0.0 0.00 - 0.04 K/UL    Differential Type MANUAL      RBC Comment ANISOCYTOSIS  2+        RBC Comment MACROCYTOSIS  1+       Hepatic Function Panel    Collection Time: 06/17/22  3:09 AM   Result Value Ref Range    Total Protein 5.6 (L) 6.4 - 8.2 g/dL    Albumin 1.9 (L) 3.5 - 5.0 g/dL    Globulin 3.7 2.0 - 4.0 g/dL    Albumin/Globulin Ratio 0.5 (L) 1.1 - 2.2      Total  Bilirubin 2.7 (H) 0.2 - 1.0 MG/DL    Bilirubin, Direct 2.1 (H) 0.0 - 0.2 MG/DL    Alk Phosphatase 223 (H) 45 - 117 U/L    AST 96 (H) 15 - 37 U/L    ALT 48 12 - 78 U/L          Intake/Output Summary (Last 24  hours) at 06/17/2022 0806  Last data filed at 06/17/2022 0709  Gross per 24 hour   Intake 1529.19 ml   Output 2349.7 ml   Net -820.51 ml            Data Review:   Recent Labs     06/16/22  0926 06/17/22  0309   NA 133* 135*   K 4.1 3.7   BUN 43* 34*   CREATININE 2.81* 1.88*   WBC  --  18.4*   HGB  --  7.4*   HCT  --  24.6*   PLT  --  235   TRIG 474*  --          No current facility-administered medications on file prior to encounter.     Current Outpatient Medications on File Prior to Encounter   Medication Sig Dispense Refill    metFORMIN (GLUCOPHAGE) 500 MG tablet Take 1 tablet by mouth daily (Patient not taking: Reported on 06/08/2022)      metoprolol (LOPRESSOR) 100 MG tablet Take 1 tablet by mouth daily      hydrALAZINE (APRESOLINE) 25 MG tablet Take 1 tablet by mouth 3 times daily (Patient not taking: Reported on 06/08/2022)      atorvastatin (LIPITOR) 40 MG tablet Take 1 tablet by mouth daily (Patient not taking: Reported on 06/08/2022)      losartan (COZAAR) 100 MG tablet Take 1 tablet by mouth daily (Patient not taking: Reported on 06/08/2022)      metFORMIN (GLUCOPHAGE-XR) 500 MG extended release tablet Take 1 tablet by mouth daily (with breakfast) (Patient not taking: Reported on 06/08/2022)      hydroCHLOROthiazide (HYDRODIURIL) 25 MG tablet TAKE 1 TABLET BY MOUTH ONCE DAILY AS NEEDED (Patient not taking: Reported on 06/08/2022)      metoprolol succinate (TOPROL XL) 100 MG extended release tablet TAKE 1 TABLET BY MOUTH ONCE DAILY WITH OR IMMEDIATELY FOLLOWING A MEAL          Care Plan discussed with:  Patient     Family      RN x    Care Manager        Hospitalist    Consultant:            Comments   >50% of visit spent in counseling and coordination of care Y          Signed By: Caren Griffins, MD     June 17, 2022       This dictation was done by dragon, Acupuncturist.  Often unanticipated grammatical, syntax, phones and other interpretive errors are inadvertently transcribed.  Please  excuse errors that have escaped final proofreading. Please contact me if you suspect dictation or transcription errors.      Dr Elenor Legato    Office No- [phone]  [facility]  [address]  [address] [place] [address]    Fax: [phone]

## 2022-06-17 NOTE — Progress Notes (Addendum)
0700 Bedside and Verbal shift change report given to Automotive engineer (oncoming nurse) by Tiajuana Amass (offgoing nurse). Report included the following information Nurse Handoff Report, Index, Adult Overview, Surgery Report, Intake/Output, MAR, Recent Results, and Cardiac Rhythm NSR .      0800 Shift assessment completed - see flowsheets     [phone] Per RT, patient now on SBT mode     1200 Reassessment completed- see flowsheets     1300 All 3 drains flushed with 10 mL of NS as per order    1600 Reassessment completed - see flowsheets     1700 Dialysis RNs at bedside resetting CRRT d/t increased TMP's     1730 CRRT machine restarted     1900 Bedside and Verbal shift change report given to Sarah RN (oncoming nurse) by Valentina Gu RN (offgoing nurse). Report included the following information Nurse Handoff Report, Index, Adult Overview, Surgery Report, Intake/Output, MAR, Recent Results, and Cardiac Rhythm NSR .

## 2022-06-17 NOTE — Other (Signed)
CRRT Re-started    Primary RN SBAR: L. Su Hilt, RN  Patient Education: procedural / infection control  Hepatitis B Surface Ag   Date/Time Value Ref Range Status   06/01/2022 10:08 PM <0.10 Index Final     Hep B S Ab   Date/Time Value Ref Range Status   06/01/2022 10:08 PM <3.10 mIU/mL Final     CRRT rinsed back due to increasing TMP. All possible blood returned to patient. Each catheter limb disinfected for 60 seconds per limb with alcohol swabs. Dialysis CVC hub scrubbed with Prevantics for 5 seconds, followed by a 5 second dry time per Hospital P&P. +flushed    Patient orders, notes, labs and code status reviewed. Time out complete. CVVHDF restarted as per policy. Lines are visible, REVERSED and secure with thermax set at [facility]. Education pre/post with primary RN.        06/17/22 1730   Vital Signs   BP (!) 118/49   Pulse 59   Respirations 16   SpO2 97 %   Observations & Evaluations   Level of Consciousness 0   Oriented X   (unable to assess)   Heart Rhythm   (ICU monitoring)   Respiratory Quality/Effort   (assisted)   O2 Device Ventilator   Skin Color Pale   Skin Condition/Temp Cool;Dry   Edema Generalized   Edema Generalized +2;Pitting   Technical Checks   ICEBOAT I;C;E;B;O;A;T   Dialysis Bath   K+ (Potassium) 2   Ca+ (Calcium)   (3.5)   Hemodialysis Central Access Right Neck   Placement Date/Time: 06/09/22 1830   Present on Admission/Arrival: No  Inserted by: dr. Rutherford Limerick  Orientation: Right  Access Location: Neck   Continued need for line? Yes   Site Assessment Clean, dry & intact   Venous Lumen Status Flushed;Infusing   Arterial Lumen Status Flushed;Infusing   Alcohol Cap Used No   Line Care Connections checked and tightened;Chlorhexidine wipes;Ports disinfected   Dressing Type Transparent   Date of Last Dressing Change 06/16/22   Dressing Status Clean, dry & intact   Dressing Change Due 06/19/22        06/17/22 1730   Treatment   $CRRT $Yes   Machine #   ([id number])   Cartridge Lot #   [id number])   Therapy Type  CVVHDF   Pressures   Access (mmHg) (!) -28 mmHg   Return/Venous (mmHg) (!) 44 mmHg   Effluent (mmHg) (!) 48 mmHg   Filter (mmHg) 116 mmHg   TMP Pressure (mmHg) 19 mmHg   Pressure Drop (mmHg) 38 mmHg   Deaeration Chamber Check Yes   Flow Rates   Therapy Fluid (L/hr) 2800 L/hr   Blood Flow (mL/min) 150 mL/min   Replacement Filter Post-Filter (mL/hr) 700 mL/hr   Dialysate (mL/hr) 1400 mL/hr   Pre-Blood Pump (mL/hr) 700 mL/hr   System Used   System Used Prisma   CRRT Activities   Intervention Ongoing   Ultrafiltrate Assessment   Ultrafiltrate Color Yellow/straw   Ultrafiltrate Appearance Clear   Hemodialysis Central Access Right Neck   Placement Date/Time: 06/09/22 1830   Present on Admission/Arrival: No  Inserted by: dr. Rutherford Limerick  Orientation: Right  Access Location: Neck   Continued need for line? Yes   Site Assessment Clean, dry & intact   Venous Lumen Status Flushed;Infusing   Arterial Lumen Status Flushed;Infusing   Alcohol Cap Used No   Line Care Connections checked and tightened;Chlorhexidine wipes;Ports disinfected   Dressing Type Transparent   Date of  Last Dressing Change 06/16/22   Dressing Status Clean, dry & intact   Dressing Change Due 06/19/22

## 2022-06-17 NOTE — Progress Notes (Signed)
Infectious Disease Progress    Impression    Sepsis  S/p septic shock  Remains on low dose pressors    Acute hypoxic respiratory failure  Re intubated 8/13  Initially intubated 7/31, s/p extubation 8/7  CXR+ for pulmonary edema.  Resp cultures + for light yeast  Apparent C.albicans/ dubliensis    E. coli bacteremia  Blood cultures 7/31+ for E. coli   2/2 LAC (pan sensitive)   Negative repeat cultures 8/4       Acute abdomen  Pneumoperitoneum  S/p diagnostic laparoscopy, laparotomy  EGD 7/31  Findings of cloudy fluid in right upper quadrant, yellow/white  Inflammatory peel over lesser curve of stomach, inferior lobe of liver  No gross perforation identified  Pancreas and retroperitoneum appeared edematous  Intra-Op fluid culture + for E. Coli (pansensitive)    Hepatic abscess  Gas and fluid containing collection 7.1 x 10.8 x 5.4 cm  In anterior left lobe of liver, patchy areas of hypodensity right lobe  4 x 4.5 x 5.5 cm collection  S/p drainage by IR 8/4.Cultures + for  few E.coli  Repeat CT 8/7 shows reduction in size of hepatic abscess  Plan was for ceftriaxone IV x6 weeks end date 9/15, Flagyl  Until 8/18  Repeat CT 8/13 + for    S/p percutaneous drainage of left hepatic abscess with  Minimal fluid remaining around catheter, suggestion of peripancreatic edema, small bilateral pleural effusions  Serum lipase 718      Persistent leukocytosis, persistent epsis  WBC 18.4  ? Abdominal source  CT abdomen pelvis with PO/ IV contrast      Gangrene, discoloration of toes  Persist, worse  Recommend Podiatry re evaluation    AKI  Cr 5. 6 5  S/p CV VH  Currently on CVVD    Coagulopathy  Improving    Thrombocytopenia  improved  platelets 235    Transaminitis  Improving    Hyperbilirubinemia   Bilirubin 4.3    Diabetes type 2  Hyperglycemia  A1c 7.7    Diarrhea  FMS present    Obesity  BMI 34.76    Lines-drain -7/31, TLC-8/13, HD access-8/8,   E T-8/13, FMS 8/14    Plan  Continue Meropenem , Daptomycin  Add Fluconazole  CT  abdomen/pelvis with p.o. and IV contrast  Vent management , fluids, pressors  per ICU team.    Case D/w Dr Blinda Leatherwood.    Extensive review of chart notes, labs, imaging, cultures done  Additionally review of done:  D/w ICU team, Dr. Salena Saner    Patient seen today.  Remains intubated.  Still on low dose pressor.  D/w ICU Team.    History reviewed. No pertinent past medical history.    Past Surgical History:   Procedure Laterality Date    BLADDER SURGERY N/A 06/01/2022    ENDOSCOPY OF ILEAL CONDUIT performed by Guinevere Ferrari, MD at [facility] MAIN OR    CT VISCERAL PERCUTANEOUS DRAIN  06/05/2022    CT VISCERAL PERCUTANEOUS DRAIN 06/05/2022 [facility] RAD CT    LAPAROSCOPY N/A 06/01/2022    LAPAROSCOPY DIAGNOSTIC performed by Guinevere Ferrari, MD at [facility] OR    LAPAROTOMY N/A 06/01/2022    LAPAROTOMY EXPLORATORY performed by Guinevere Ferrari, MD at [facility] MA[facility] OR       Allergies   Allergen Reactions    Augmentin [Amoxicillin-Pot Clavulanate] Hives     Tolerated ceftriaxone 06/2022    Codeine      Unknown reaction  Social Connections: Not on file       No family status information on file.           Review of Systems - Negative except those mentioned in H&P      PHYSICAL EXAM:  General:          Awake, in no distress  EENT:              EOMI. Anicteric sclerae. MMM  Resp:               CTA bilaterally, no wheezing or rales.  No accessory muscle use  CV:                  Regular  rhythm,  No edema  GI:                   Soft, Non distended, Non tender.  +Bowel sounds  Neurologic:      Alert and oriented X 3, normal speech,   Psych:             Good insight. Not anxious nor agitated  Skin:                No rashes.  No jaundice.  Extremities :  No edema, discoloration of  toes++.     Waymon Amato, MD Jerrel Ivory

## 2022-06-17 NOTE — Plan of Care (Signed)
Problem: Safety - Medical Restraint  Goal: Remains free of injury from restraints (Restraint for Interference with Medical Device)  Description: INTERVENTIONS:  1. Determine that other, less restrictive measures have been tried or would not be effective before applying the restraint  2. Evaluate the patient's condition at the time of restraint application  3. Inform patient/family regarding the reason for restraint  4. Q2H: Monitor safety, psychosocial status, comfort, nutrition and hydration  06/17/2022 1635 by Janit Pagan, RN  Outcome: Progressing  06/17/2022 0709 by Jac Canavan, RN  Outcome: Progressing

## 2022-06-17 NOTE — Progress Notes (Signed)
EP/ ARRHYTHMIA Progress Note    Patient ID:  Patient: Jaime [name]  MRN: [id number]  Age: 76 y.o.  DOB: 1946/09/08    Date of  Admission: 05/31/2022 11:25 PM   PCP:  No primary care provider on file.  Usual cardiologist:  Jodelle Red, MD in [facility] [phone]    Assessment:   Sustained monomorphic VT but also with some salvoes earlier this admission when he was quite sick.  Suspect this is ectopic and due to critical illness and catecholamines.  He has not had recent recurrence.  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  Moderate aortic stenosis by outside echo 2020, limited study done here on 8/1.  Sepsis with GNR (E. Coli).  Diagnostic laparoscopy, laparotomy, EGD on 7/31.  IR drain to hepatic abscesses 8/4.  Multiorgan dysfunction, shock.  Severe thrombocytopenia, improved.  Full code.    Plan:     Continue amiodarone by NGT.  Volume removal by CVVHD.    Supportive care from a cardiac standpoint.  I'll check back later in the week.      [x]        High complexity decision making was performed in this patient at high risk for decompensation with multiple organ involvement.    Jaime [name] is a 76 y.o. male with a history of the above.  I was asked to consult due to recurrent VT requiring repeated amiodarone and shock attempts.     History reviewed. No pertinent past medical history.     Past Surgical History:   Procedure Laterality Date    BLADDER SURGERY N/A 06/01/2022    ENDOSCOPY OF ILEAL CONDUIT performed by 06/03/2022, MD at [facility] MAIN OR    CT VISCERAL PERCUTANEOUS DRAIN  06/05/2022    CT VISCERAL PERCUTANEOUS DRAIN 06/05/2022 [facility] RAD CT    LAPAROSCOPY N/A 06/01/2022    LAPAROSCOPY DIAGNOSTIC performed by 06/03/2022, MD at [facility]    LAPAROTOMY N/A 06/01/2022    LAPAROTOMY EXPLORATORY performed by 06/03/2022, MD at [facility]       Social History     Tobacco Use    Smoking status: Not on file    Smokeless tobacco: Not on file   Substance Use Topics     Alcohol use: Not on file        No family history on file.     Allergies   Allergen Reactions    Augmentin [Amoxicillin-Pot Clavulanate] Hives     Tolerated ceftriaxone 06/2022    Codeine      Unknown reaction          Current Facility-Administered Medications   Medication Dose Route Frequency    meropenem (MERREM) 1,000 mg in sodium chloride 0.9 % 100 mL IVPB (mini-bag)  1,000 mg IntraVENous Q8H    insulin glargine (LANTUS) injection vial 10 Units  10 Units SubCUTAneous Nightly    insulin lispro (HUMALOG) injection vial 0-4 Units  0-4 Units SubCUTAneous TID WC    insulin lispro (HUMALOG) injection vial 0-4 Units  0-4 Units SubCUTAneous Nightly    fluconazole (DIFLUCAN) in 0.9 % sodium chloride IVPB 200 mg  200 mg IntraVENous Q24H    dexmedetomidine (PRECEDEX) 400 mcg in sodium chloride 0.9 % 100 mL infusion  0.1-1.5 mcg/kg/hr IntraVENous Continuous    amiodarone (CORDARONE) 450 mg in dextrose 5 % 250 mL infusion (Vial2Bag)  0.5 mg/min IntraVENous Continuous    DAPTOmycin (CUBICIN) 500 mg in sodium chloride 0.9 % 50 mL IVPB  6  mg/kg (Adjusted) IntraVENous Q24H    norepinephrine (LEVOPHED) 16 mg in sodium chloride 0.9 % 250 mL infusion  0.5-20 mcg/min IntraVENous Continuous    prismaSol BGK 2/3.5 dialysis solution   Dialysis Continuous    acidophilus probiotic capsule 1 capsule  1 capsule Oral Daily    alcohol 62% (NOZIN) nasal sanitizer 1 ampule  1 ampule Topical BID    bisacodyl (DULCOLAX) suppository 10 mg  10 mg Rectal Daily PRN    albumin human 25% IV solution 25 g  25 g IntraVENous PRN    [Held by provider] amiodarone (CORDARONE) tablet 400 mg  400 mg Oral Daily    heparin (porcine) injection 1,200 Units  1,200 Units IntraCATHeter PRN    And    heparin (porcine) injection 1,000 Units  1,000 Units IntraCATHeter PRN    polyethylene glycol (GLYCOLAX) packet 17 g  17 g Oral Daily    heparin (porcine) injection 5,000 Units  5,000 Units SubCUTAneous 3 times per day    lansoprazole (PREVACID SOLUTAB) disintegrating  tablet 30 mg  30 mg Per NG tube Daily    hydrALAZINE (APRESOLINE) injection 10 mg  10 mg IntraVENous Q4H PRN    sodium chloride 0.9 % bolus 100 mL  100 mL IntraVENous PRN    fentaNYL (SUBLIMAZE) injection 50 mcg  50 mcg IntraVENous Q1H PRN    balsum peru-castor oil (VENELEX) ointment   Topical BID    glucose chewable tablet 16 g  4 tablet Oral PRN    dextrose bolus 10% 125 mL  125 mL IntraVENous PRN    Or    dextrose bolus 10% 250 mL  250 mL IntraVENous PRN    glucagon (rDNA) injection 1 mg  1 mg SubCUTAneous PRN    dextrose 10 % infusion   IntraVENous Continuous PRN    sodium chloride flush 0.9 % injection 5-40 mL  5-40 mL IntraVENous 2 times per day    sodium chloride flush 0.9 % injection 5-40 mL  5-40 mL IntraVENous PRN    0.9 % sodium chloride infusion   IntraVENous PRN    acetaminophen (TYLENOL) tablet 650 mg  650 mg Oral Q6H PRN    Or    acetaminophen (TYLENOL) suppository 650 mg  650 mg Rectal Q6H PRN    ondansetron (ZOFRAN) injection 4 mg  4 mg IntraVENous Q6H PRN       Review of Symptoms:  He cannot communicate.     Objective:      Physical Exam:  Temp (24hrs), Avg:99 F (37.2 C), Min:98.7 F (37.1 C), Max:99.3 F (37.4 C)    Patient Vitals for the past 8 hrs:   Pulse   06/17/22 1600 65   06/17/22 1545 62   06/17/22 1530 66   06/17/22 1515 66   06/17/22 1500 66   06/17/22 1445 67   06/17/22 1430 61   06/17/22 1415 64   06/17/22 1400 65   06/17/22 1300 58   06/17/22 1248 64   06/17/22 1247 63   06/17/22 1246 61   06/17/22 1245 64   06/17/22 1230 68   06/17/22 1228 62   06/17/22 1223 65   06/17/22 1215 67   06/17/22 1200 67   06/17/22 1145 76   06/17/22 1130 72   06/17/22 1115 68   06/17/22 1100 70   06/17/22 1045 69   06/17/22 1030 68   06/17/22 1015 65   06/17/22 1000 66   06/17/22 0945 63   06/17/22 0930  62   06/17/22 0915 64   06/17/22 0900 66   06/17/22 0845 73      Patient Vitals for the past 8 hrs:   Resp   06/17/22 1600 17   06/17/22 1545 16   06/17/22 1530 16   06/17/22 1515 20   06/17/22 1500 16    06/17/22 1445 16   06/17/22 1430 16   06/17/22 1415 16   06/17/22 1400 16   06/17/22 1300 16   06/17/22 1248 18   06/17/22 1247 16   06/17/22 1246 16   06/17/22 1245 (!) 9   06/17/22 1230 15   06/17/22 1228 18   06/17/22 1223 16   06/17/22 1215 14   06/17/22 1200 17   06/17/22 1145 13   06/17/22 1130 20   06/17/22 1115 17   06/17/22 1100 20   06/17/22 1045 21   06/17/22 1030 19   06/17/22 1015 20   06/17/22 1000 20   06/17/22 0945 19   06/17/22 0930 17   06/17/22 0915 16   06/17/22 0900 14   06/17/22 0845 13      Patient Vitals for the past 8 hrs:   BP   06/17/22 1600 (!) 119/49   06/17/22 1545 (!) 105/48   06/17/22 1530 (!) 112/49   06/17/22 1515 (!) 108/50   06/17/22 1500 (!) 108/50   06/17/22 1445 (!) 112/49   06/17/22 1430 (!) 104/46   06/17/22 1415 (!) 106/48   06/17/22 1400 (!) 118/48   06/17/22 1300 (!) 104/46   06/17/22 1245 (!) 108/46   06/17/22 1230 (!) 109/52   06/17/22 1228 (!) 92/50   06/17/22 1223 (!) 86/44   06/17/22 1215 (!) 96/46   06/17/22 1200 (!) 90/50   06/17/22 1145 (!) 123/49   06/17/22 1130 (!) 128/53   06/17/22 1115 (!) 123/51   06/17/22 1100 (!) 135/53   06/17/22 1045 (!) 134/57   06/17/22 1030 (!) 129/53   06/17/22 1015 (!) 127/52   06/17/22 1000 (!) 137/50   06/17/22 0945 (!) 131/51   06/17/22 0930 (!) 127/54   06/17/22 0915 (!) 129/55   06/17/22 0900 (!) 132/50   06/17/22 0845 (!) 125/56          Intake/Output Summary (Last 24 hours) at 06/17/2022 1637  Last data filed at 06/17/2022 1600  Gross per 24 hour   Intake 1949.19 ml   Output 2236.4 ml   Net -287.21 ml         Nondiaphoretic, not in acute distress.  Unlabored, clear to auscultation bilaterally anteriorly, symmetric air movement.  Regular rate and rhythm, +soft systolic murmur, no pericardial rub.  No peripheral edema.  Extremities without cyanosis or clubbing.  Muscle tone and bulk normal for age.  Skin warm and dry.  Reduced perfusion to toes.  Sedated and on the ventilator.    CARDIOGRAPHICS and STUDIES, I  reviewed:    Telemetry:  SINUS RHYTHM.    ECG:  Studies reviewed from the last 24 hours.    Echo 8/1:  Left Ventricle Normal left ventricular systolic function with a visually estimated EF of 55 - 60%. Not well visualized. Left ventricle size is normal. Increased wall thickness. Unable to assess wall motion. Diastolic dysfunction present with normal LV EF.   Left Atrium Not well visualized.   Right Ventricle Not well visualized. Normal systolic function.   Right Atrium Not well visualized.   Aortic Valve Not well visualized. No regurgitation.  No stenosis.   Mitral Valve Not well visualized. No regurgitation. No stenosis noted.   Tricuspid Valve Not well visualized. No regurgitation. No stenosis noted.   Pulmonic Valve The pulmonic valve was not well visualized.   Aorta Not well visualized. Normal sized sinus of Valsalva.   Pericardium No pericardial effusion.       Labs:  No results for input(s): CPK, CKMB in the last 72 hours.    Invalid input(s): CPKMB, CKNDX, TROIQ  No results found for: CHOL, CHOLX, CHLST, CHOLV, HDL, HDLC, LDL, LDLC, TGLX, TRIGL  No results for input(s): INR, APTT in the last 72 hours.    Invalid input(s): PTP     Recent Labs     06/15/22  0403 06/16/22  0344 06/16/22  0926 06/17/22  0309   NA 132*  --  133* 135*   K 5.6*  --  4.1 3.7   CL 100  --  101 103   CO2 20*  --  24 26   BUN 86*  --  43* 34*   PHOS 8.6*  --  4.0 3.1   WBC 21.7* 19.2*  --  18.4*   HGB 8.0* 7.1*  --  7.4*   HCT 24.3* 21.7*  --  24.6*   PLT 270 224  --  235       Recent Labs     06/17/22  0309   GLOB 3.7       No components found for: GLPOC  No results for input(s): PH, PCO2, PO2 in the last 72 hours.      Tamera Punt, MD 06/17/22 4:37 PM

## 2022-06-17 NOTE — Progress Notes (Signed)
Occupational Therapy    Chart reviewed. Pt remains intubated/sedated. OT will sign off. Please re-consult when medically appropriate.

## 2022-06-17 NOTE — Progress Notes (Signed)
[facility]    ICU TEAM Progress Note    Name: Jaime [name]   DOB: Jun 13, 1946   MRN: [id number]   Date: 06/17/2022        Subjective:     Reason for ICU Admission: respiratory failure     HPI: 76 y/o male ([facility] [place]) with PMHx significant of HTN, HLD and T2DM. Came in ED 7/30 noon for N/V/Abd pain and generalized weakess. Admitted for intra abdominal septic shock.  S/p ex lap that showed fluid collection in right upper quadrant.  Pt in the ICU 7/31-8/11.  Started on broad spectrum antibiotics.  BC grew enterobacter and e coli.  Being followed by ID. Hosp c/c/b episodes of V-tach and worsening AKI requiring CRRT.  Improved and downgraded to step down unit on 8/11.  Early in the morning on 8/13 found to be acutely hypotensive and unresponsive.    Patient was transferred to ICU and intubated on 08/13.    8/14: pressor requirement is slowly declining. Remains intubated and sedated. Now on fio2 40%    8/15: started on CVVHD yesterday, pressors being tapered    08/16 - continues to be on mechanical ventilator.       Past Medical History:      has no past medical history on file.    Past Surgical History:      has a past surgical history that includes laparotomy (N/A, 06/01/2022); laparoscopy (N/A, 06/01/2022); Bladder surgery (N/A, 06/01/2022); and CT DRAINAGE VISCERAL PERCUTANEOUS (06/05/2022).    Home Medications:     Prior to Admission medications    Medication Sig Start Date End Date Taking? Authorizing Provider   metFORMIN (GLUCOPHAGE) 500 MG tablet Take 1 tablet by mouth daily  Patient not taking: Reported on 06/08/2022   Yes Historical Provider, MD   metoprolol (LOPRESSOR) 100 MG tablet Take 1 tablet by mouth daily   Yes Historical Provider, MD   hydrALAZINE (APRESOLINE) 25 MG tablet Take 1 tablet by mouth 3 times daily  Patient not taking: Reported on 06/08/2022   Yes Historical Provider, MD   atorvastatin (LIPITOR) 40 MG tablet Take 1 tablet by mouth daily  Patient not taking: Reported on 06/08/2022    Yes Historical Provider, MD   losartan (COZAAR) 100 MG tablet Take 1 tablet by mouth daily  Patient not taking: Reported on 06/08/2022   Yes Historical Provider, MD   metFORMIN (GLUCOPHAGE-XR) 500 MG extended release tablet Take 1 tablet by mouth daily (with breakfast)  Patient not taking: Reported on 06/08/2022 03/20/22   Historical Provider, MD   hydroCHLOROthiazide (HYDRODIURIL) 25 MG tablet TAKE 1 TABLET BY MOUTH ONCE DAILY AS NEEDED  Patient not taking: Reported on 06/08/2022 03/17/22   Historical Provider, MD   metoprolol succinate (TOPROL XL) 100 MG extended release tablet TAKE 1 TABLET BY MOUTH ONCE DAILY WITH OR IMMEDIATELY FOLLOWING A MEAL 03/10/22   Historical Provider, MD       Allergies/Social/Family History:     Allergies   Allergen Reactions    Augmentin [Amoxicillin-Pot Clavulanate] Hives     Tolerated ceftriaxone 06/2022    Codeine      Unknown reaction      Social History     Tobacco Use    Smoking status: Not on file    Smokeless tobacco: Not on file   Substance Use Topics    Alcohol use: Not on file      No family history on file.    Review of Systems:  Review of systems not obtained due to patient factors.    Objective:   Vital Signs:  BP (!) 104/46   Pulse 58   Temp 98.7 F (37.1 C) (Oral)   Resp 16   Ht 1.727 m (5\' 8" )   Wt 107.7 kg (237 lb 7 oz)   SpO2 98%   BMI 36.10 kg/m      Temp (24hrs), Avg:99.3 F (37.4 C), Min:98.7 F (37.1 C), Max:100.5 F (38.1 C)           Intake/Output:     Intake/Output Summary (Last 24 hours) at 06/17/2022 1328  Last data filed at 06/17/2022 1300  Gross per 24 hour   Intake 1973.21 ml   Output 2430.2 ml   Net -456.99 ml         Physical Exam:  Intubated,sedated  HEENT: dry mucus membranes  Heart: s1,s2  Lungs: poor air entry b/l  Abd: soft. Liver abscess drain  Ext: no edema  Cns: Sedated.     LABS AND  DATA: Personally reviewed  Recent Labs     06/16/22  0344 06/17/22  0309   WBC 19.2* 18.4*   HGB 7.1* 7.4*   HCT 21.7* 24.6*   PLT 224 235       Recent Labs      06/16/22  0926 06/17/22  0309   NA 133* 135*   K 4.1 3.7   CL 101 103   CO2 24 26   BUN 43* 34*   MG 2.2 2.1   PHOS 4.0 3.1       Recent Labs     06/17/22  0309   GLOB 3.7       No results for input(s): INR, APTT in the last 72 hours.    Invalid input(s): PTP     Invalid input(s): PHI, PCO2I, PO2I, FIO2I  No results for input(s): CPK, CKMB in the last 72 hours.    Invalid input(s): TROIQ, BNPP    Hemodynamics:   PAP:   CO:     [place]: Discharge Planning  Living Arrangements: Spouse/Significant Other (06/05/22 1005)  Support Systems: Spouse/Significant Other;Family Members (06/05/22 1005)  Type of Residence: House (06/05/22 1005)  Potential Assistance Needed: N/A (06/05/22 1005)  Patient expects to be discharged to:: Unknown (06/05/22 1005) CI:         MEDS: Reviewed    Chest X-Ray: pul edema on 8/13      ECHO: 8/1 : normal EF    Multidisciplinary Rounds Completed:  Pending    ABCDEF Bundle/Checklist Completed:  Yes      ICU Assessment/ Comprehensive Plan of Care:     Carrel Leather Is a 76 y.o. male with ESRD on HD who is admitted for liver abscess, now respiratory failure.    NEURO  Switched to precedex for weaning and extubation    CARDIAC  Taper off levophed as tolerated  8/1 : ef was normal    RESPIRATORY  Vent support  Pulmonary toilet  No significant secretions  SAT and SBT today    RENAL  On CVVD    Foley Catheter Present: No  IVFs: NS bolus 250 cc  x1    GASTROINTESTINAL  Liver drain  Tolerating trickle feeds so far  JP drain clear  Liver drain still has purulent material    ID  F/u cultures  Ct abdomen has revealed pancreatitis : continue meropenem for now  ID folowing the case    ENDOCRINE  SSI  MUSCULOSKELETAL  scd's    Mobility: Poor  PT/OT: PT consulted and on board and OT consulted and on board       DVT Prophylaxis: SCD's or Sequential Compression Device , enoxaparin  U - Ulcer Prophylaxis:   G - Glycemic Control: Insulin  B - Bowel Regimen:   Tubes: ETT  Lines: Central Line and Quinton  Drains:  Jackson-Pratt Drain (JP)    DISPOSITION/COMMUNICATION  Discussed Plan of Care/Code Status: Full Code  Stay in ICU    CRITICAL CARE CONSULTANT NOTE  I had a face to face encounter with the patient, reviewed and interpreted patient data including clinical events, labs, images, vital signs, I/O's, and examined patient.  I have discussed the case and the plan and management of the patient's care with the consulting services, the bedside nurses and the respiratory therapist.      NOTE OF PERSONAL INVOLVEMENT IN CARE   This patient has a high probability of imminent, clinically significant deterioration, which requires the highest level of preparedness to intervene urgently. I participated in the decision-making and personally managed or directed the management of the following life and organ supporting interventions that required my frequent assessment to treat or prevent imminent deterioration.    I personally spent 40 minutes of critical care time.  This is time spent at this critically ill patient's bedside actively involved in patient care as well as the coordination of care.  This does not include any procedural time which has been billed separately.    Catha Brow, MD  Intensivist  06/17/2022

## 2022-06-17 NOTE — Care Coordination-Inpatient (Addendum)
Transition of Care Plan:     RUR: 18%  Prior Level of Functioning:  Independent - lives with wife in [place]  Disposition: TBD  If SNF or IPR: Date FOC offered:   Date FOC received:   Accepting facility:   Date authorization started with reference number:   Date authorization received and expires:   Follow up appointments: PCP/Specialist  DME needed: TBD  Transportation at discharge: family if appropriate  IM/IMM Medicare/Tricare letter given: will need 2nd IMM prior to discharge  Is patient a Veteran and connected with VA? yes              If yes, was [employer] transfer form completed and VA notified?   Caregiver Contact: Arvine Clayburn wife [phone]  Discharge Caregiver contacted prior to discharge? Per patient when able  Care Conference needed?   Barriers to discharge: medical stabiltiy, patient is intubated    CM spoke with Meriam Sprague at [facility] - sent medicals on patient to them at fax# [phone] to pursue transfer- patient lives in [place] and wants to be closer to home - if patient becomes more stable off ventilation and able to participate in PT/OT may pursue placement to a rehab center closer to home.  Ella Jubilee, MSW

## 2022-06-17 NOTE — Progress Notes (Addendum)
1900: Bedside report received from [place], RN    2000: Pt turned wih 2 staff to left side. Incontinence care provided around rectal tube. Dressings changed on abdominal drains that were saturated with serous, yellow drainage. Venelex applied to backside. Pt tolerated well. Assessment completed.    2200: Pt turned on right side. Pt declined feet being elevated at this time. Accordion drain flushed with 10cc saline    2345: CT called to clarify order for scan. Discussed with provider, oral and IV contrast ordered.    0015: Pt turned on Left side. Tube feeds disconnected from dobhoff in anticipation of contrast solution to give.    0030: Contrast solution infused into dobhoff. Pt tolerated well    0115: rinsed back CRRT in preparation for going to CT scan. Heparin locked Quinton per order    0215: Return from CT scan. Pt reconnected to monitor in room. CHG bath completed and incontinence care done. Rectal tube flushed, stool loose but thicker than content in bag, irrigated tube. Zinc cream applied around area. Wound care done on feet and abdominal incisions.    5643: CRRT restarted    0527: Spoke with Lowell Bouton, NP about Hgb 6.8 on labs and potassium of 3.1    0600: Pt had large loose stool around rectal tube. Incontinence care provided. Turned on right side.     0645: Type and screen sent    0700: Bedside report given to Dimas Aguas, RN and Thea Silversmith, [place]

## 2022-06-17 NOTE — Consults (Signed)
Palliative Medicine  [phone]    Have been following peripherally  He has had an acute decompensation, but as the cause is not clear at this time, and he is making small gains again, and there was a question of transferring him to a hospital closer to home- I am not getting actively involved again at this stage    Please call if our help is needed    Lennox Laity, APRN - NP

## 2022-06-17 NOTE — Progress Notes (Signed)
Palliative Medicine SW Note    Palliative SW checked in on patient after consulting with NP Tiffany Pajic and review of chart. Patient was alert and interactive, though limited by being intubated to nodding "yes/no" as he was receiving dialysis and being cared for by assigned nurse Lucy.      This Clinical research associate provided caring presence, comfort touch, holding his hand as he squeezed mine. Provided words of comfort and encouragement and affirmation. Valentina Gu is keeping his wife updated, as she is in [place], [place] and very limited in her ability to to travel.    Psychosocial Issues Identified/ Resilience Factors:   Patient is a veteran of the [name]. He is retired now and has VA benefits, but had a high co-pay for his medication due to his income, so has not been using [place] benefits.  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 His wife uses O2 and has a Designer, jewellery. She ambulates in a wheelchair. She is buying groceries for [name] and paying bills while patient is hospitalized.     Patient and spouse will be married 35 years on August 20. His best friend is Financial planner. They have also known each other for many years and Karren Burly was with patient on his trip to [place] and urged him to go to the hospital, for which patient is grateful. Patient was in [place] with his friend to attend Nascar race, but became ill before he could watch any of the races. He is hoping to return to [place] as soon as feasible.     Caregiver Burden: High  Does the caregiver feel confident administering medication? Not discussed.  Does the caregiver need any help connecting with community resources? Yes  Does the caregiver feel confident assisting with activities of daily living? No.     Goals of Care / Plan:   Patient's symptoms will be managed.  Patient wants to transfer as soon as medically stable (by 8/20) to a hospital in [place], [place].  Spouse now has his wedding ring, which was tight on his finger and  removed by ICU nurse.  Patient will be offered the opportunity to complete an AMD if he interested and able during this hospitalization.  Palliative SW will continue to provide education and support as appropriate.     Thank you for including Palliative Medicine in the care of Mr. Jaime [name].     Natalia Leatherwood, LCSW  [phone])

## 2022-06-17 NOTE — Other (Addendum)
[facility]  [facility]  DIABETES MANAGEMENT CONSULT    Consulted by Provider for advanced nursing evaluation and care for inpatient blood glucose management.    Evaluation and Action Plan   This 76 year old Caucasian male was admitted with RUQ pain and underwent laparotomy 06/01/22. Was in septic shock with multi-organ failure. Several episodes of Vtach. Remained on vent support, pressors & CRRT. A new finding of liver abscess noted on CT; drain placed with diminishing output. There is concern for LLE viability due to diminished pedal pulses. Ischemia of first 2 toes bilaterally. Per vascular, he has not been a candidate for endovascular or surgical revascularization at this time. Moved out of ICU but experienced an acute decline (hypotension, arrhythmia & AMS) 06/14/22 & was returned to ICU. Again intubated & sedated and on pressor support but these drugs are being tapered. Infectious disease following & making Abx recommendations. CRRT per nephrology.    As for BG management in this patient with known Type 2 diabetes, began low dose basal insulin 06/02/22 along with corrective insulin to address impact of steroid. Bgs had been 170-180s but continued to rise. Insulin adjusted on 06/05/22. HC stopped 06/07/22. Bgs subsequently in target. High residuals resulted in TF being stopped; hence, did not required any insulin 06/11/22. Has not passed swallow test again 06/15/22. TF restarted at 10cc/hr. Bgs in 120-170s. Corrective approach has been in use. TF running @ goal rate now. Bgs 190s.     Management Rationale Action Plan   Medication   TF Running at goal rate of 30cc/hr (115 CHO/D)   Begin Lantus insulin 10 units D    Switch to LOW corrective approach     Additional orders               Initial Presentation   Jaime [name] is a 76 y.o. male admitted 05/31/22 from ER after experiencing RUQ pain associated with generalized weakness, nausea & vomiting for several days PTA. AMS+. Hypotension+. Fever+.  Dyspneic+. He is visiting from [place].  Ectopic atrial tachycardia  ER exam:  Cardiovascular:      Rate and Rhythm: Regular rhythm. Tachycardia present.   Pulmonary:      Comments: He is tachypneic, coarse breath sounds on inspiration at bilateral bases  Abdominal:      Palpations: Abdomen is soft.      Comments: Tender to palpation diffusely in the right sided abdomen without guarding or rebound tenderness   LAB: WBC 16.6. Normal H&H. Low platelets.BG 272/AG 17. AKI. Elevated liver enzymes. NT pro-BNP 20,980. Lipase >3000.     CXR:   Right IJ catheter satisfactory position without pneumothorax. ET   tube and NG tube as above   CT Head: Negative  CT ABd:   Extraluminal air bubbles in the right upper quadrant concerning for gastric   perforation. Inflammatory changes are noted about the tail of the pancreas   extending into the left anterior pararenal space, please correlate with any   evidence of acute pancreatitis. Bilateral lower lobe infiltrates.     VO:[id number] reviewed. No pertinent past medical history.     INITIAL DX: Septicemia (HCC) [A41.9]  Gastric perforation (HCC) [K25.5]  Perforated abdominal viscus [R19.8]     Current Treatment     TX: 06/01/22 LAPAROTOMY EXPLORATORY  LAPAROSCOPY DIAGNOSTIC ENDOSCOPY OF ILEAL CONDUIT  EGD    Hospital Course   Clinical progress has been complicated by multi-organ failure.   06/01/22 Dialysis  06/02/22 In septic shock with  multi-organ failure. On vent support, sedation, pressor support and bicarb infusion. On Abx. Two runs of VT this morning requiring defibrillation; cardiology conversation anticipated. CRRT+  06/03/22 Remains on vent support C vent Fi02 50%/Peep 7. Low grade fever; on Abx. Steroids+. Afib/bradycardic. Pressors+. CRRT+.   06/04/22 Remains on vent support and pressors. On Abx. Concern for LLE. CRRT. CT: Enlarged left hepatic abscess contain gas with other areas of probable abscess/phlegmonous infection.  06/05/22 On AC vent support. On Amio, vaso, levo &  Fentanyl infusions. OG draining green fluid. CRRT+.  Vascular: Patient remains at risk for progressive deterioration and limb loss but needs to be weaned from pressor support before intervention is safe.  06/08/22 On Spon vent support; plans to extubate after CT today. Fentanyl pushes. H&H low. CRRT+. Mottled & blue toes bilaterally. Vascular AKI study to be completed  06/09/22 Alert. 02NC. Amio infusion+. Pedal pulses returning (no need to use doppler)  06/10/22 Alert. 02NC. NSR. Off pressors. TF running. Swallow eval today => may begin CL. Undergoing hemodialysis now. Continued cyanosis of bilateral toes.  06/11/22 Alert & conversational today  06/12/22 A&O. Failed swallow; allowed ice chips. Podiatry to see for LE ischemia  06/15/22 Intubated & sedated on pressor support. TF restarted at trickle rate. Dialysis planned per nephrology.  06/16/22 Eyes open. Intubated & sedated on Precedex. ST. Hypotensive on epi infusion. Amio+. TF running @10cc /hr. CVVH+.   06/17/22 Eyes closed. Intubated & sedated on Precedex. Spon vent Fi02 50/Peep 5; trying to wean. NSR. Amio +. TF @goal  rate. CVVH+. Wound care involved    Diabetes History   Type 2 diabetes on metformin therapy  Checks Bgs and they are in the 100s  Is physically active    Diabetes-related Medical History deferred    Diabetes Medication History - based on PTA list  Drug class Currently in use   Biguanide [x]  Metformin (Glucophage)  []  Metformin ER (Glucophage XL)     Diabetes self-management practices: deferred  Overall evaluation:    [x]  Unknown A1c     Subjective   NA     Objective   Physical exam  General Obese male who is ill-appearing  Neuro  Eyes closed. Sedated  Vital Signs   Vitals:    06/17/22 1000   BP: (!) 137/50   Pulse: 66   Resp: 20   Temp:    SpO2: 97%     Extremities    Diabetic foot exam:    Left Foot     Visual Exam: Cool & dry. Thickened toenails. 3 toes gangrenous    Pulse DP: +1   Right Foot   Visual Exam: Cool & dry. Thickened toenails . 4 toes  gangrenous   Pulse DP: +1      Laboratory  Recent Labs     06/15/22  0403 06/16/22  0344 06/17/22  0309   WBC 21.7* 19.2* 18.4*   HGB 8.0* 7.1* 7.4*   HCT 24.3* 21.7* 24.6*   MCV 92.0 95.2 100.8*   PLT 270 224 235       Recent Labs     06/15/22  0403 06/16/22  0926 06/17/22  0309   NA 132* 133* 135*   K 5.6* 4.1 3.7   CL 100 101 103   CO2 20* 24 26   PHOS 8.6* 4.0 3.1   BUN 86* 43* 34*   CREATININE 5.65* 2.81* 1.88*       Lab Results   Component Value Date    ALT 48  06/17/2022    AST 96 (H) 06/17/2022    ALKPHOS 223 (H) 06/17/2022    BILITOT 2.7 (H) 06/17/2022     No results found for: TSH, TSHREFLEX, TSHFT4, TSHELE, TSH3GEN, TSHHS   Lab Results   Component Value Date    LABA1C 7.7 (H) 06/02/2022     Factors impacting BG management  Factor Dose Comments   Nutrition:  TF   @target  rate of 30cc/hr  (115 CHO/D)   Running goal rate   Pain Fent PRN    Infection Merrem Q12 hrs  Cubicin Q24 hrs WBC but trending down   Other:   Kidney function  Liver function   AKI  Liver enzymes elevated   CRRT     Blood glucose pattern      Significant diabetes-related events  05/31/22 Admission BG 233  06/01/22 Dex 4mg  => BG 100s => HC started  06/02/22 Bgs into 200s  06/03/22 Bgs in 170-180s  06/04/22 Bgs rising abit. HC reduced frequency today. Trickle feeds to begin  06/05/22 Bgs rising into 200s  06/08/22 Bgs in target  06/09/22 Bgs rising with start of TF at higher rate  06/10/22 Bgs rose abit with TF @30cc /hr  06/11/22 Bgs 108, 113. No insulin given  06/12/22 No insulin given. No nutrition. AKI  06/14/22 Acute decline in health status after dialysis => back to ICU  06/15/22 TF restarted  06/16/22 TF still at 10cc/hr  06/17/22 TF at goal rate. Bgs 190s    Assessment and Nursing Intervention   Nursing Diagnosis Risk for unstable blood glucose pattern   Nursing Intervention Domain 5250 Decision-making Support   Nursing Interventions Examined current inpatient diabetes/blood glucose control   Explored factors facilitating and impeding inpatient  management  Explored corrective strategies with patient and responsible inpatient provider   Informed patient of rational for insulin strategy while hospitalized     Billing Code(s)   []  99233 IP subsequent hospital care - 50 minutes   [x]  99232 IP subsequent hospital care - 35 minutes   []  99231 IP subsequent hospital care - 25 minutes   []  99221 IP initial hospital care - 40 minutes     Before making these care recommendations, I personally reviewed the hospitalization record, including notes, laboratory & diagnostic data and current medications, and examined the patient at the bedside (circumstances permitting) before determining care. More than fifty (50) percent of the time was spent in patient counseling and/or care coordination.  Total minutes: 35    06/17/22, APRN - CNS  Clinical Nurse Specialist - Diabetes & endocrine disorders  [facility]  Access via [employer]

## 2022-06-17 NOTE — Plan of Care (Signed)
Problem: Safety - Medical Restraint  Goal: Remains free of injury from restraints (Restraint for Interference with Medical Device)  Description: INTERVENTIONS:  1. Determine that other, less restrictive measures have been tried or would not be effective before applying the restraint  2. Evaluate the patient's condition at the time of restraint application  3. Inform patient/family regarding the reason for restraint  4. Q2H: Monitor safety, psychosocial status, comfort, nutrition and hydration  Outcome: Progressing

## 2022-06-17 NOTE — Progress Notes (Signed)
Physical Therapy    Chart reviewed. Pt remains intubated and sedated. Will sign off and complete order at this time. Please re-consult if patient becomes medically appropriate.     Gershon Mussel, PT, DPT

## 2022-06-18 ENCOUNTER — Inpatient Hospital Stay: Admit: 2022-06-18 | Payer: MEDICARE

## 2022-06-18 ENCOUNTER — Ambulatory Visit (HOSPITAL_BASED_OUTPATIENT_CLINIC_OR_DEPARTMENT_OTHER): Payer: Medicare HMO | Admitting: Cardiology

## 2022-06-18 LAB — CBC WITH AUTO DIFFERENTIAL
Absolute Immature Granulocyte: 0 10*3/uL (ref 0.00–0.04)
Absolute Immature Granulocyte: 0 10*3/uL (ref 0.00–0.04)
Band Neutrophils: 2 %
Basophils %: 3 % — ABNORMAL HIGH (ref 0–1)
Basophils %: 0 % (ref 0–1)
Basophils Absolute: 0.5 10*3/uL — ABNORMAL HIGH (ref 0.0–0.1)
Basophils Absolute: 0 10*3/uL (ref 0.0–0.1)
Eosinophils %: 0 % (ref 0–7)
Eosinophils %: 0 % (ref 0–7)
Eosinophils Absolute: 0 10*3/uL (ref 0.0–0.4)
Eosinophils Absolute: 0 10*3/uL (ref 0.0–0.4)
Hematocrit: 22.9 % — ABNORMAL LOW (ref 36.6–50.3)
Hematocrit: 27 % — ABNORMAL LOW (ref 36.6–50.3)
Hemoglobin: 6.8 g/dL — ABNORMAL LOW (ref 12.1–17.0)
Hemoglobin: 8.3 g/dL — ABNORMAL LOW (ref 12.1–17.0)
Immature Granulocytes: 0 % (ref 0.0–0.5)
Immature Granulocytes: 0 % (ref 0.0–0.5)
Lymphocytes %: 11 % — ABNORMAL LOW (ref 12–49)
Lymphocytes %: 6 % — ABNORMAL LOW (ref 12–49)
Lymphocytes Absolute: 1.7 10*3/uL (ref 0.8–3.5)
Lymphocytes Absolute: 1 10*3/uL (ref 0.8–3.5)
MCH: 30.9 PG (ref 26.0–34.0)
MCH: 31.1 PG (ref 26.0–34.0)
MCHC: 29.7 g/dL — ABNORMAL LOW (ref 30.0–36.5)
MCHC: 30.7 g/dL (ref 30.0–36.5)
MCV: 104.1 FL — ABNORMAL HIGH (ref 80.0–99.0)
MCV: 101.1 FL — ABNORMAL HIGH (ref 80.0–99.0)
MPV: 11.3 FL (ref 8.9–12.9)
MPV: 10.9 FL (ref 8.9–12.9)
Monocytes %: 5 % (ref 5–13)
Monocytes %: 7 % (ref 5–13)
Monocytes Absolute: 0.8 10*3/uL (ref 0.0–1.0)
Monocytes Absolute: 1.1 10*3/uL — ABNORMAL HIGH (ref 0.0–1.0)
Myelocytes: 1 %
Neutrophils %: 80 % — ABNORMAL HIGH (ref 32–75)
Neutrophils %: 85 % — ABNORMAL HIGH (ref 32–75)
Neutrophils Absolute: 12 10*3/uL — ABNORMAL HIGH (ref 1.8–8.0)
Neutrophils Absolute: 14.3 10*3/uL — ABNORMAL HIGH (ref 1.8–8.0)
Nucleated RBCs: 0.1 PER 100 WBC — ABNORMAL HIGH
Nucleated RBCs: 0.2 PER 100 WBC — ABNORMAL HIGH
Platelets: 169 10*3/uL (ref 150–400)
Platelets: 161 10*3/uL (ref 150–400)
RBC: 2.2 M/uL — ABNORMAL LOW (ref 4.10–5.70)
RBC: 2.67 M/uL — ABNORMAL LOW (ref 4.10–5.70)
RDW: 19.3 % — ABNORMAL HIGH (ref 11.5–14.5)
RDW: 18.6 % — ABNORMAL HIGH (ref 11.5–14.5)
WBC: 15 10*3/uL — ABNORMAL HIGH (ref 4.1–11.1)
WBC: 16.4 10*3/uL — ABNORMAL HIGH (ref 4.1–11.1)
nRBC: 0.02 10*3/uL — ABNORMAL HIGH (ref 0.00–0.01)
nRBC: 0.04 10*3/uL — ABNORMAL HIGH (ref 0.00–0.01)

## 2022-06-18 LAB — MAGNESIUM: Magnesium: 2 mg/dL (ref 1.6–2.4)

## 2022-06-18 LAB — BASIC METABOLIC PANEL
Anion Gap: 5 mmol/L (ref 5–15)
BUN: 29 MG/DL — ABNORMAL HIGH (ref 6–20)
Bun/Cre Ratio: 18 (ref 12–20)
CO2: 26 mmol/L (ref 21–32)
Calcium: 8.9 MG/DL (ref 8.5–10.1)
Chloride: 103 mmol/L (ref 97–108)
Creatinine: 1.64 MG/DL — ABNORMAL HIGH (ref 0.70–1.30)
Est, Glom Filt Rate: 43 mL/min/{1.73_m2} — ABNORMAL LOW (ref >60)
Glucose: 183 mg/dL — ABNORMAL HIGH (ref 65–100)
Potassium: 3.1 mmol/L — ABNORMAL LOW (ref 3.5–5.1)
Sodium: 134 mmol/L — ABNORMAL LOW (ref 136–145)

## 2022-06-18 LAB — POCT GLUCOSE
POC Glucose: 206 mg/dL — ABNORMAL HIGH (ref 65–117)
POC Glucose: 216 mg/dL — ABNORMAL HIGH (ref 65–117)
POC Glucose: 191 mg/dL — ABNORMAL HIGH (ref 65–117)
POC Glucose: 161 mg/dL — ABNORMAL HIGH (ref 65–117)

## 2022-06-18 LAB — PHOSPHORUS: Phosphorus: 2.2 MG/DL — ABNORMAL LOW (ref 2.6–4.7)

## 2022-06-18 LAB — LIPASE: Lipase: 737 U/L — ABNORMAL HIGH (ref 73–393)

## 2022-06-18 LAB — ALBUMIN: Albumin: 1.7 g/dL — ABNORMAL LOW (ref 3.5–5.0)

## 2022-06-18 LAB — CK: Total CK: 344 U/L — ABNORMAL HIGH (ref 39–308)

## 2022-06-18 LAB — PREPARE RBC (CROSSMATCH)

## 2022-06-18 MED ORDER — PRISMASOL BGK 4/2.5 DIALYSIS SOLUTION
32-4-2.5 | Status: DC
Start: 2022-06-18 — End: 2022-06-19
  Administered 2022-06-18 (×4): via INTRAVENOUS_CENTRAL

## 2022-06-18 MED ORDER — DIATRIZOATE MEGLUMINE & SODIUM 66-10 % PO SOLN
66-10 % | Freq: Once | ORAL | Status: AC | PRN
Start: 2022-06-18 — End: 2022-06-21
  Administered 2022-06-18 (×2): 30 mL via ORAL

## 2022-06-18 MED ORDER — CEFEPIME HCL 1 G IJ SOLR
1 | INTRAMUSCULAR | Status: DC
Start: 2022-06-18 — End: 2022-06-25
  Administered 2022-06-18 – 2022-06-24 (×7): 1000 mg via INTRAVENOUS

## 2022-06-18 MED ORDER — SODIUM CHLORIDE 0.9 % IV SOLN
0.9 | INTRAVENOUS | Status: DC | PRN
Start: 2022-06-18 — End: 2022-06-20

## 2022-06-18 MED ORDER — INSULIN LISPRO 100 UNIT/ML IJ SOLN
100 UNIT/ML | Freq: Three times a day (TID) | INTRAMUSCULAR | Status: AC
Start: 2022-06-18 — End: 2022-06-18
  Administered 2022-06-18: 14:00:00 1 [IU] via SUBCUTANEOUS

## 2022-06-18 MED ORDER — INSULIN LISPRO 100 UNIT/ML IJ SOLN
100 UNIT/ML | Freq: Four times a day (QID) | INTRAMUSCULAR | Status: DC
Start: 2022-06-18 — End: 2022-06-17

## 2022-06-18 MED ORDER — INSULIN LISPRO 100 UNIT/ML IJ SOLN
100 UNIT/ML | Freq: Four times a day (QID) | INTRAMUSCULAR | Status: AC
Start: 2022-06-18 — End: ?
  Administered 2022-06-19 – 2022-06-23 (×5): 1 [IU] via SUBCUTANEOUS

## 2022-06-18 MED ORDER — METRONIDAZOLE 500 MG/100ML IV SOLN
500 MG/100ML | Freq: Two times a day (BID) | INTRAVENOUS | Status: AC
Start: 2022-06-18 — End: 2022-06-25
  Administered 2022-06-18 – 2022-06-25 (×15): 500 mg via INTRAVENOUS

## 2022-06-18 MED ORDER — POTASSIUM BICARB-CITRIC ACID 20 MEQ PO TBEF
20 MEQ | Freq: Once | ORAL | Status: AC
Start: 2022-06-18 — End: 2022-06-18
  Administered 2022-06-18: 10:00:00 20 meq via NASOGASTRIC

## 2022-06-18 MED ORDER — VANCOMYCIN 2500 MG IN NS 500 ML (PREMIX) IVPB
Freq: Once | Status: DC
Start: 2022-06-18 — End: 2022-06-18
  Administered 2022-06-18: 14:00:00 2500 mg/kg via INTRAVENOUS

## 2022-06-18 MED ORDER — POTASSIUM PHOSPHATE 3 MMOL/ML IV SOLN (MIXTURES ONLY)
155 mmol/5 mL | Freq: Once | INTRAVENOUS | Status: AC
Start: 2022-06-18 — End: 2022-06-18
  Administered 2022-06-18: 12:00:00 30 mmol via INTRAVENOUS

## 2022-06-18 MED ORDER — AMIODARONE HCL 200 MG PO TABS
200 | Freq: Every day | ORAL | Status: DC
Start: 2022-06-18 — End: 2022-06-20
  Administered 2022-06-18 – 2022-06-20 (×3): 400 mg via ORAL

## 2022-06-18 MED ORDER — IOPAMIDOL 76 % IV SOLN
76 % | Freq: Once | INTRAVENOUS | Status: AC | PRN
Start: 2022-06-18 — End: 2022-06-18
  Administered 2022-06-18: 06:00:00 100 mL via INTRAVENOUS

## 2022-06-18 MED ORDER — INSULIN GLARGINE 100 UNIT/ML SC SOLN
100 | Freq: Every evening | SUBCUTANEOUS | Status: DC
Start: 2022-06-18 — End: 2022-06-19
  Administered 2022-06-19: 01:00:00 12 [IU] via SUBCUTANEOUS

## 2022-06-18 MED FILL — LANTUS 100 UNIT/ML SC SOLN: 100 UNIT/ML | SUBCUTANEOUS | Qty: 1

## 2022-06-18 MED FILL — EFFER-K 20 MEQ PO TBEF: 20 MEQ | ORAL | Qty: 1

## 2022-06-18 MED FILL — RISAQUAD-2 PO CAPS: ORAL | Qty: 1

## 2022-06-18 MED FILL — PRECEDEX 200 MCG/2ML IV SOLN: 200 MCG/2ML | INTRAVENOUS | Qty: 4

## 2022-06-18 MED FILL — CEFEPIME HCL 1 G IJ SOLR: 1 g | INTRAMUSCULAR | Qty: 1000

## 2022-06-18 MED FILL — HUMALOG 100 UNIT/ML IJ SOLN: 100 UNIT/ML | INTRAMUSCULAR | Qty: 1

## 2022-06-18 MED FILL — BPCO EX OINT: CUTANEOUS | Qty: 60

## 2022-06-18 MED FILL — HEPARIN SODIUM (PORCINE) 1000 UNIT/ML IJ SOLN: 1000 UNIT/ML | INTRAMUSCULAR | Qty: 10

## 2022-06-18 MED FILL — MERREM 1 G IV SOLR: 1 g | INTRAVENOUS | Qty: 1000

## 2022-06-18 MED FILL — NOREPINEPHRINE-SODIUM CHLORIDE 16-0.9 MG/250ML-% IV SOLN: INTRAVENOUS | Qty: 250

## 2022-06-18 MED FILL — HEPARIN SODIUM (PORCINE) 5000 UNIT/ML IJ SOLN: 5000 UNIT/ML | INTRAMUSCULAR | Qty: 1

## 2022-06-18 MED FILL — FENTANYL CITRATE (PF) 100 MCG/2ML IJ SOLN: 100 MCG/2ML | INTRAMUSCULAR | Qty: 2

## 2022-06-18 MED FILL — POTASSIUM PHOSPHATES 45 MMOLE/15ML IV SOLN: 45 MMOLE/15ML | INTRAVENOUS | Qty: 10

## 2022-06-18 MED FILL — LANSOPRAZOLE 30 MG PO TBDD: 30 MG | ORAL | Qty: 1

## 2022-06-18 MED FILL — METRONIDAZOLE 500 MG/100ML IV SOLN: 500 MG/100ML | INTRAVENOUS | Qty: 100

## 2022-06-18 MED FILL — CUBICIN RF 500 MG IV SOLR: 500 MG | INTRAVENOUS | Qty: 10

## 2022-06-18 MED FILL — FLUCONAZOLE IN SODIUM CHLORIDE 200-0.9 MG/100ML-% IV SOLN: INTRAVENOUS | Qty: 100

## 2022-06-18 MED FILL — VANCOMYCIN 2500 MG IN NS 500 ML (PREMIX) IVPB: Qty: 500

## 2022-06-18 MED FILL — AMIODARONE HCL 200 MG PO TABS: 200 MG | ORAL | Qty: 2

## 2022-06-18 MED FILL — MD-GASTROVIEW 66-10 % PO SOLN: 66-10 % | ORAL | Qty: 30

## 2022-06-18 NOTE — Other (Addendum)
CRRT / [phone]    Primary RN SBAR: S.Whitley, RN  Patient Education: pt unable to educate - intubated/sedated, no family at bedside    Hepatitis B Surface Ag   Date/Time Value Ref Range Status   06/01/2022 10:08 PM <0.10 Index Final     Hep B S Ab   Date/Time Value Ref Range Status   06/01/2022 10:08 PM <3.10 mIU/mL Final       06/18/22 0350   Treatment   $CRRT $Yes   Machine #   [id number])   Cartridge Lot #   [id number])   Therapy Type CVVHDF   Pressures   Access (mmHg) (!) -31 mmHg   Return/Venous (mmHg) (!) 35 mmHg   Filter (mmHg) 101 mmHg   TMP Pressure (mmHg) 13 mmHg   Pressure Drop (mmHg) 31 mmHg   Deaeration Chamber Check Yes   Flow Rates   Therapy Fluid (L/hr) 2800 L/hr   Blood Flow (mL/min) 150 mL/min   Replacement Filter Post-Filter (mL/hr) 700 mL/hr   Dialysate (mL/hr) 1400 mL/hr   Pre-Blood Pump (mL/hr) 700 mL/hr   System Used   System Used [employer] Calculation   (D) Physician Ordered Hourly Removal (mL) 0 ml   CRRT Activities   Intervention Initiated   Hemodialysis Central Access Right Neck   Placement Date/Time: 06/09/22 1830   Present on Admission/Arrival: No  Inserted by: dr. Rutherford Limerick  Orientation: Right  Access Location: Neck   Continued need for line? Yes   Site Assessment Clean, dry & intact   Venous Lumen Status Infusing   Arterial Lumen Status Infusing   Line Care Connections checked and tightened;Ports disinfected   Dressing Type Bacteriocidal;Transparent   Date of Last Dressing Change 06/16/22   Dressing Status Clean, dry & intact     Davita RN at bedside to restart CRRT d/t pt s/p imaging. Patient, code status, labs, and orders verified. Consents on chart. Time out completed. Primary RN able to return all pt blood from circuit (186 mls.) Old set discarded in red biohazard bin. HF-1000 filter set-up, primed w/ 1L NS, tested and running well. Lines visible and connections secure with Thermax blood warmer to return line at 37*C. Education, pre and post to primary RN.    Lines running  reversed on start.    HR: 52  BP: 112/52    Primary RN SBAR: S.Whitley, RN

## 2022-06-18 NOTE — Progress Notes (Signed)
Infectious Disease Progress    Impression    Sepsis  S/p septic shock  Remains on low dose pressors    Acute hypoxic respiratory failure  Re intubated 8/13  Initially intubated 7/31, s/p extubation 8/7  CXR+ for pulmonary edema.  Resp cultures + for light yeast  Apparent C.albicans/ dubliensis    E. coli bacteremia  Blood cultures 7/31+ for E. coli   2/2 LAC (pan sensitive)   Negative repeat cultures 8/4       Acute abdomen  Pneumoperitoneum  S/p diagnostic laparoscopy, laparotomy  EGD 7/31  Findings of cloudy fluid in right upper quadrant, yellow/white  Inflammatory peel over lesser curve of stomach, inferior lobe of liver  No gross perforation identified  Pancreas and retroperitoneum appeared edematous  Intra-Op fluid culture + for E. Coli (pansensitive)    Hepatic abscess  Gas and fluid containing collection 7.1 x 10.8 x 5.4 cm  In anterior left lobe of liver, patchy areas of hypodensity right lobe  4 x 4.5 x 5.5 cm collection  S/p drainage by IR 8/4.Cultures + for  few E.coli  Repeat CT 8/7 shows reduction in size of hepatic abscess  Plan was for ceftriaxone IV x6 weeks end date 9/15, Flagyl  Until 8/18  Repeat CT 8/13 + for    S/p percutaneous drainage of left hepatic abscess with  Minimal fluid remaining around catheter, suggestion of peripancreatic edema, small bilateral pleural effusions  Repeat CT abdomen pelvis 8/16+ for increased peripancreatic edema suspicious for pancreatitis, small residual fluid collection in left hepatic lobe  Lipase 737      Persistent leukocytosis  WBC 16.4  Purulent material oozing from abdominal wound  For cultures       Gangrene, discoloration of toes  Persist, worse  Recommend Podiatry re evaluation    AKI  Cr 1.64  Currently on CVVD    Coagulopathy  Improving    Thrombocytopenia  improved  platelets 235    Transaminitis  Improving    Hyperbilirubinemia   Bilirubin 2.7    Diabetes type 2  Hyperglycemia  A1c 7.7    Diarrhea  FMS present    Obesity  BMI 34.76    Lines-drain  -7/31, TLC-8/13, HD access-8/8,   E T-8/13, FMS 8/14    Plan  De-escalate therapy to cefepime, Flagyl  Continue fluconazole  Wound cultures of purulent drainage  Vent management , fluids, pressors  per ICU team.    ID    Extensive review of chart notes, labs, imaging, cultures done  Additionally review of done:  D/w ICU team, Dr. Salena Saner    Patient seen today.  Remains intubated.  Still on low dose pressor.  D/w ICU Team.    History reviewed. No pertinent past medical history.    Past Surgical History:   Procedure Laterality Date    BLADDER SURGERY N/A 06/01/2022    ENDOSCOPY OF ILEAL CONDUIT performed by Guinevere Ferrari, MD at [facility] OR    CT VISCERAL PERCUTANEOUS DRAIN  06/05/2022    CT VISCERAL PERCUTANEOUS DRAIN 06/05/2022 [facility] RAD CT    LAPAROSCOPY N/A 06/01/2022    LAPAROSCOPY DIAGNOSTIC performed by Guinevere Ferrari, MD at [facility]    LAPAROTOMY N/A 06/01/2022    LAPAROTOMY EXPLORATORY performed by Guinevere Ferrari, MD at [facility]       Allergies   Allergen Reactions    Augmentin [Amoxicillin-Pot Clavulanate] Hives     Tolerated ceftriaxone 06/2022    Codeine  Unknown reaction       Social Connections: Not on file       No family status information on file.           Review of Systems - Negative except those mentioned in H&P      PHYSICAL EXAM:  General:          Awake, in no distress  EENT:              EOMI. Anicteric sclerae. MMM  Resp:               CTA bilaterally, no wheezing or rales.  No accessory muscle use  CV:                  Regular  rhythm,  No edema  GI:                   Soft, Non distended, Non tender.  +Bowel sounds  Neurologic:      Alert and oriented X 3, normal speech,   Psych:             Good insight. Not anxious nor agitated  Skin:                No rashes.  No jaundice.  Extremities :  No edema, discoloration of  toes++.     Waymon Amato, MD Jerrel Ivory

## 2022-06-18 NOTE — Progress Notes (Signed)
0700  Report received from Jaime [name]. RN    0730  Dual skin with Night shift RN patient cleaned from leaking FMS and peri care repositioned for comfort wound noted to sacrum Venelex applied open to air    0800  Assessment complete  Awake alert able to follow commands moves all ext's will give thumbs up when asked denies pain  Precedex on low dose stopped for complete SAT  RRT in room to SBT patient  ETT in place on ventilator Dobhoff in right nare 62 cm with Renal at 30 at goal water flushes 50 every 4 hours.  Left IJ in place with Levo at 9 mcg for MAP will wean as tolerated  Amio at 0.5 and KVO  Right [place] in place on CRRT 2k bag factor zero tolerating at this time  FMS in place with liquid brown stool  Anuric  anasarca noted radial pulses felt pedal pulses with doppler  toes black and mottled   mouth dry blister to lips and tongue area  Awaiting for blood to be allocated also    0815  Dr Ena Dawley into see patient will change to 4K bag when patient receives blood ok to count in removal also after blood can come off CRRT later this afternoon    0830  Dr Blinda Leatherwood into see patient per Dr Blinda Leatherwood d/c Amio  will restart oral amio    1100  IDR completed with team Abx changes may need trach   Janae Sauce NP stopped by ok to remove JP drains and remove staples from abdomen    1130  Call back from patient spouse for consent for blood    1200  Assessment complete no changes  weaning Levo down see MAR    1205  Blood started    1522 Blood completed    1600  Assessment complete no changes    1715  Stopped CRRT rinsed back all blood    1830  JP drains removed  staples removed but as removing staples abdominal wound began to dehiscence oozing blood and purlent liquid some staples were left in place at bottom portion of wound cleaned with saline and dry dressing placed.  Notified A Roy NP orders for wet to dry dressing and wound care consult for possible wound vac to area tomorrow.  Dressing applied to JP area's    1900  Report given to  [facility]

## 2022-06-18 NOTE — Progress Notes (Signed)
Name: Jaime [name]   MRN: [id number]  DOB: June 24, 1946  06/18/2022 9:30 AM      Admit Date: 05/31/2022    Admit Diagnosis: Septicemia (HCC) [A41.9]  Gastric perforation (HCC) [K25.5]  Perforated abdominal viscus [R19.8]    Assessment/Plan:     Principal Problem:    Gastric perforation (HCC)  Active Problems:    Type 2 diabetes mellitus with hyperglycemia, without long-term current use of insulin (HCC)    Intestinal obstruction (HCC)    Severe sepsis (HCC)    Septic shock (HCC)    Escherichia coli septicemia (HCC)    Liver abscess    Pneumoperitoneum    Acute respiratory failure with hypoxia (HCC)    AKI (acute kidney injury) (HCC)    Multi-organ failure with heart failure (HCC)    Thrombocytopenia (HCC)    E coli bacteremia    Hepatic abscess    Peripheral arterial disease (HCC)    Hyperbilirubinemia    Gram negative sepsis (HCC)    Septicemia (HCC)    Aspiration pneumonia of both lower lobes (HCC)    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 *          06/18/22     Assessment:         AKI/ATN- anuric- Grade 3-started CVVHDF 06/15/2022  Resp failure Vent Dependent-FiO2 50%  Septic shock-still on low-dose pressor  Hyponatremia-sodium to 134  Hypokalemia with potassium of 3.1  Hypophosphatemia with phosphorus of 2.2  Hypoalbuminemia with albumin of 1.7 due to chronic inflammation  Lipase elevated at 737 suggestive of pancreatitis, along with CT scan  Encephalopathy  Acute abdomen from abdominal viscus perforation    Liver abscess  S/p emergent exploratory laparotomy 06/01/22    Type 2 DM  Shock liver  Acute on chronic anemia       Discussion/MDM: Patient with multiple medical comorbidities, each with high likelihood for morbidity and mortality if left untreated.  Patient being treated with medication that requires intensive monitoring due to increased risk for toxicity. I have  reviewed patient's presenting subjective and objective findings, as well as all laboratory studies, imaging studies, and vital signs to date as well as treatment rendered and patient's response to those treatments.  In addition, prior medical, surgical and relevant social and family histories were reviewed.        Discussion:     CVVHDF day 4.  Delivered dialysis dose 26 mL/kg/hr  With findings of pancreatitis now  A-fib with RVR on amiodarone  iHD 8/12;  Right IJ quinton 8/8  CVVHD initiated 7/31 until 8/6;  Still in shock and pressor dependent  Severe PAD with gangrene of toes      Plan:     We will keep CVVHDF on until he needs transfusion and then discontinue it.  We will change over to 4K dialysate fluids and replacement fluids    Bryan Lemma, MD    Dr Dorthey Sawyer  Cell no- [phone]  Available on perfect serve.      Signed By: Bryan Lemma, MD     June 18, 2022         Subjective:   Hospital day 17   Intubated and sedated. Clinical events discussed with RN    Review of Systems:  Review of systems not obtained due to patient factors.   Objective:    BP (!) 134/48   Pulse 56  Temp 98.5 F (36.9 C) (Axillary)   Resp 18   Ht 1.727 m (5\' 8" )   Wt 99.7 kg (219 lb 12.8 oz)   SpO2 98%   BMI 33.42 kg/m     Physical Exam:  Physical Exam  Constitutional:       Appearance: He is ill-appearing.   HENT:      Head: Atraumatic.      Nose: No congestion or rhinorrhea.   Cardiovascular:      Rate and Rhythm: Normal rate and regular rhythm.   Pulmonary:      Comments: Ventilator dependent  Abdominal:      General: There is distension.   Skin:     Findings: Bruising and lesion present.   Neurological:      Mental Status: He is disoriented.      Access- R IJ Dialysis access  L ij tLC  Peripheral gangrene noted in some toes-no change.  Fecal management system in place  Tube feeds on going  Amiodarone drip  Recent Results (from the past 24 hour(s))   POCT Glucose    Collection Time: 06/17/22  1:42 PM    Result Value Ref Range    POC Glucose 214 (H) 65 - 117 mg/dL    Performed by: 06/19/22) RN    POCT Glucose    Collection Time: 06/17/22  5:52 PM   Result Value Ref Range    POC Glucose 207 (H) 65 - 117 mg/dL    Performed by: 06/19/22) RN    POCT Glucose    Collection Time: 06/17/22  9:15 PM   Result Value Ref Range    POC Glucose 206 (H) 65 - 117 mg/dL    Performed by: 06/19/22 FLEX    CBC with Auto Differential    Collection Time: 06/18/22  4:19 AM   Result Value Ref Range    WBC 15.0 (H) 4.1 - 11.1 K/uL    RBC 2.20 (L) 4.10 - 5.70 M/uL    Hemoglobin 6.8 (L) 12.1 - 17.0 g/dL    Hematocrit 06/20/22 (L) 36.6 - 50.3 %    MCV 104.1 (H) 80.0 - 99.0 FL    MCH 30.9 26.0 - 34.0 PG    MCHC 29.7 (L) 30.0 - 36.5 g/dL    RDW 16.1 (H) 09.6 - 14.5 %    Platelets 169 150 - 400 K/uL    MPV 11.3 8.9 - 12.9 FL    Nucleated RBCs 0.1 (H) 0 PER 100 WBC    nRBC 0.02 (H) 0.00 - 0.01 K/uL    Neutrophils % 80 (H) 32 - 75 %    Lymphocytes % 11 (L) 12 - 49 %    Monocytes % 5 5 - 13 %    Eosinophils % 0 0 - 7 %    Basophils % 3 (H) 0 - 1 %    Myelocytes 1 %    Immature Granulocytes 0 0.0 - 0.5 %    Neutrophils Absolute 12.0 (H) 1.8 - 8.0 K/UL    Lymphocytes Absolute 1.7 0.8 - 3.5 K/UL    Monocytes Absolute 0.8 0.0 - 1.0 K/UL    Eosinophils Absolute 0.0 0.0 - 0.4 K/UL    Basophils Absolute 0.5 (H) 0.0 - 0.1 K/UL    Absolute Immature Granulocyte 0.0 0.00 - 0.04 K/UL    Differential Type MANUAL      RBC Comment ANISOCYTOSIS  1+       Basic Metabolic Panel  Collection Time: 06/18/22  4:19 AM   Result Value Ref Range    Sodium 134 (L) 136 - 145 mmol/L    Potassium 3.1 (L) 3.5 - 5.1 mmol/L    Chloride 103 97 - 108 mmol/L    CO2 26 21 - 32 mmol/L    Anion Gap 5 5 - 15 mmol/L    Glucose 183 (H) 65 - 100 mg/dL    BUN 29 (H) 6 - 20 MG/DL    Creatinine 5.32 (H) 0.70 - 1.30 MG/DL    Bun/Cre Ratio 18 12 - 20      Est, Glom Filt Rate 43 (L) >60 ml/min/1.80m2    Calcium 8.9 8.5 - 10.1 MG/DL   Magnesium    Collection Time:  06/18/22  4:19 AM   Result Value Ref Range    Magnesium 2.0 1.6 - 2.4 mg/dL   Phosphorus    Collection Time: 06/18/22  4:19 AM   Result Value Ref Range    Phosphorus 2.2 (L) 2.6 - 4.7 MG/DL   Albumin    Collection Time: 06/18/22  4:19 AM   Result Value Ref Range    Albumin 1.7 (L) 3.5 - 5.0 g/dL   CK    Collection Time: 06/18/22  4:19 AM   Result Value Ref Range    Total CK 344 (H) 39 - 308 U/L   Lipase    Collection Time: 06/18/22  4:19 AM   Result Value Ref Range    Lipase 737 (H) 73 - 393 U/L   PREPARE RBC (CROSSMATCH), 1 Units    Collection Time: 06/18/22  5:45 AM   Result Value Ref Range    History Check Historical check performed    TYPE AND SCREEN    Collection Time: 06/18/22  6:48 AM   Result Value Ref Range    Crossmatch expiration date 06/21/2022,2359     ABO/Rh A POSITIVE     Antibody Screen NEG     Unit Number [id number]     Product Code Blood Bank RC LR     Unit Divison 00     Dispense Status Blood Bank ALLOCATED     Crossmatch Result Compatible    POCT Glucose    Collection Time: 06/18/22  9:21 AM   Result Value Ref Range    POC Glucose 216 (H) 65 - 117 mg/dL    Performed by: Alan Ripper RN           Intake/Output Summary (Last 24 hours) at 06/18/2022 0930  Last data filed at 06/18/2022 0900  Gross per 24 hour   Intake 2581.18 ml   Output 1516.5 ml   Net 1064.68 ml            Data Review:   Recent Labs     06/16/22  0926 06/17/22  0309 06/18/22  0419   NA 133*   < > 134*   K 4.1   < > 3.1*   BUN 43*   < > 29*   CREATININE 2.81*   < > 1.64*   WBC  --    < > 15.0*   HGB  --    < > 6.8*   HCT  --    < > 22.9*   PLT  --    < > 169   TRIG 474*  --   --     < > = values in this interval not displayed.         No current  facility-administered medications on file prior to encounter.     Current Outpatient Medications on File Prior to Encounter   Medication Sig Dispense Refill    metFORMIN (GLUCOPHAGE) 500 MG tablet Take 1 tablet by mouth daily (Patient not taking: Reported on 06/08/2022)      metoprolol  (LOPRESSOR) 100 MG tablet Take 1 tablet by mouth daily      hydrALAZINE (APRESOLINE) 25 MG tablet Take 1 tablet by mouth 3 times daily (Patient not taking: Reported on 06/08/2022)      atorvastatin (LIPITOR) 40 MG tablet Take 1 tablet by mouth daily (Patient not taking: Reported on 06/08/2022)      losartan (COZAAR) 100 MG tablet Take 1 tablet by mouth daily (Patient not taking: Reported on 06/08/2022)      metFORMIN (GLUCOPHAGE-XR) 500 MG extended release tablet Take 1 tablet by mouth daily (with breakfast) (Patient not taking: Reported on 06/08/2022)      hydroCHLOROthiazide (HYDRODIURIL) 25 MG tablet TAKE 1 TABLET BY MOUTH ONCE DAILY AS NEEDED (Patient not taking: Reported on 06/08/2022)      metoprolol succinate (TOPROL XL) 100 MG extended release tablet TAKE 1 TABLET BY MOUTH ONCE DAILY WITH OR IMMEDIATELY FOLLOWING A MEAL          Care Plan discussed with:  Patient     Family      RN x    Care Manager        Hospitalist    Consultant:            Comments   >50% of visit spent in counseling and coordination of care Y          Signed By: Bryan Lemma, MD     June 18, 2022       This dictation was done by dragon, Advertising account planner.  Often unanticipated grammatical, syntax, phones and other interpretive errors are inadvertently transcribed.  Please excuse errors that have escaped final proofreading. Please contact me if you suspect dictation or transcription errors.      Dr Dorthey Sawyer    Office No- [phone]  [facility]  [address]  [address]  [place], [place] [address]    Fax: [phone]

## 2022-06-18 NOTE — Progress Notes (Signed)
[facility]    ICU TEAM Progress Note    Name: Jaime [name]   DOB: May 07, 1946   MRN: [id number]   Date: 06/18/2022        Subjective:     Reason for ICU Admission: respiratory failure     HPI: 76 y/o male ([facility] [place]) with PMHx significant of HTN, HLD and T2DM. Came in ED 7/30 noon for N/V/Abd pain and generalized weakess. Admitted for intra abdominal septic shock.  S/p ex lap that showed fluid collection in right upper quadrant.  Pt in the ICU 7/31-8/11.  Started on broad spectrum antibiotics.  BC grew enterobacter and e coli.  Being followed by ID. Hosp c/c/b episodes of V-tach and worsening AKI requiring CRRT.  Improved and downgraded to step down unit on 8/11.  Early in the morning on 8/13 found to be acutely hypotensive and unresponsive.    Patient was transferred to ICU and intubated on 08/13.    8/14: pressor requirement is slowly declining. Remains intubated and sedated. Now on fio2 40%    8/15: started on CVVHD yesterday, pressors being tapered    08/16 - continues to be on mechanical ventilator.     08/17 - Follows command off sedation. On SBT but very debilitated.       Past Medical History:      has no past medical history on file.    Past Surgical History:      has a past surgical history that includes laparotomy (N/A, 06/01/2022); laparoscopy (N/A, 06/01/2022); Bladder surgery (N/A, 06/01/2022); and CT DRAINAGE VISCERAL PERCUTANEOUS (06/05/2022).    Home Medications:     Prior to Admission medications    Medication Sig Start Date End Date Taking? Authorizing Provider   metFORMIN (GLUCOPHAGE) 500 MG tablet Take 1 tablet by mouth daily  Patient not taking: Reported on 06/08/2022   Yes Historical Provider, MD   metoprolol (LOPRESSOR) 100 MG tablet Take 1 tablet by mouth daily   Yes Historical Provider, MD   hydrALAZINE (APRESOLINE) 25 MG tablet Take 1 tablet by mouth 3 times daily  Patient not taking: Reported on 06/08/2022   Yes Historical Provider, MD   atorvastatin (LIPITOR) 40 MG tablet  Take 1 tablet by mouth daily  Patient not taking: Reported on 06/08/2022   Yes Historical Provider, MD   losartan (COZAAR) 100 MG tablet Take 1 tablet by mouth daily  Patient not taking: Reported on 06/08/2022   Yes Historical Provider, MD   metFORMIN (GLUCOPHAGE-XR) 500 MG extended release tablet Take 1 tablet by mouth daily (with breakfast)  Patient not taking: Reported on 06/08/2022 03/20/22   Historical Provider, MD   hydroCHLOROthiazide (HYDRODIURIL) 25 MG tablet TAKE 1 TABLET BY MOUTH ONCE DAILY AS NEEDED  Patient not taking: Reported on 06/08/2022 03/17/22   Historical Provider, MD   metoprolol succinate (TOPROL XL) 100 MG extended release tablet TAKE 1 TABLET BY MOUTH ONCE DAILY WITH OR IMMEDIATELY FOLLOWING A MEAL 03/10/22   Historical Provider, MD       Allergies/Social/Family History:     Allergies   Allergen Reactions    Augmentin [Amoxicillin-Pot Clavulanate] Hives     Tolerated ceftriaxone 06/2022    Codeine      Unknown reaction      Social History     Tobacco Use    Smoking status: Not on file    Smokeless tobacco: Not on file   Substance Use Topics    Alcohol use: Not on file  No family history on file.    Review of Systems:     Review of systems not obtained due to patient factors.    Objective:   Vital Signs:  BP (!) 150/52   Pulse 60   Temp 98.5 F (36.9 C) (Axillary)   Resp 18   Ht 1.727 m (5\' 8" )   Wt 99.7 kg (219 lb 12.8 oz)   SpO2 98%   BMI 33.42 kg/m      Temp (24hrs), Avg:98.8 F (37.1 C), Min:98.1 F (36.7 C), Max:99.7 F (37.6 C)           Intake/Output:     Intake/Output Summary (Last 24 hours) at 06/18/2022 0834  Last data filed at 06/18/2022 0800  Gross per 24 hour   Intake 2644.8 ml   Output 1541.3 ml   Net 1103.5 ml         Physical Exam:  Intubated,sedated,   HEENT: dry mucus membranes  Heart: s1,s2  Lungs: poor air entry b/l  Abd: soft. Liver abscess drain + additional 2 drains in the RLQ.  Ext: gangrene of the toes bilaterally noted.   Cns: Sedated but follows commands off  sedation.     LABS AND  DATA: Personally reviewed  Recent Labs     06/17/22  0309 06/18/22  0419   WBC 18.4* 15.0*   HGB 7.4* 6.8*   HCT 24.6* 22.9*   PLT 235 169       Recent Labs     06/17/22  0309 06/18/22  0419   NA 135* 134*   K 3.7 3.1*   CL 103 103   CO2 26 26   BUN 34* 29*   MG 2.1 2.0   PHOS 3.1 2.2*       Recent Labs     06/17/22  0309   GLOB 3.7       No results for input(s): INR, APTT in the last 72 hours.    Invalid input(s): PTP     Invalid input(s): PHI, PCO2I, PO2I, FIO2I  No results for input(s): CPK, CKMB in the last 72 hours.    Invalid input(s): TROIQ, BNPP    Hemodynamics:   PAP:   CO:     [place]: Discharge Planning  Living Arrangements: Spouse/Significant Other (06/05/22 1005)  Support Systems: Spouse/Significant Other;Family Members (06/05/22 1005)  Type of Residence: House (06/05/22 1005)  Potential Assistance Needed: N/A (06/05/22 1005)  Patient expects to be discharged to:: Unknown (06/05/22 1005) CI:         MEDS: Reviewed    Chest X-Ray: pul edema on 8/13      ECHO: 8/1 : normal EF    Multidisciplinary Rounds Completed:  Pending.    ABCDEF Bundle/Checklist Completed:  Yes      ICU Assessment/ Comprehensive Plan of Care:     Jaime [name] Is a 76 y.o. male with ESRD on HD who is admitted for liver abscess, now respiratory failure.    NEURO  Currently on precedex for sedation and Fentanyl IV pushes for pain.     CARDIAC  Taper off levophed as tolerated  8/1 : EF was normal  Had A.fib with RVR - 08/15, started on amiodarone gtt. Cardiology following. Will d/c gtt and keep on PO amiodarone 08/17.    RESPIRATORY  Vent support  Pulmonary toilet  No significant secretions  SAT and SBT today - He is currently on SBT and has good RSBI but he is very debilitated with weak cough,  he was intubated 08/13.     RENAL  On CVVD    Foley Catheter Present: No  IVFs: NS bolus 250 cc  x1    GASTROINTESTINAL  Liver drain  Tolerating trickle feeds so far  JP drain clear  Liver drain still has purulent  material  CT abdomen repeated 0817 - shows peri-pancreatic edema.     ID  F/u cultures  Ct abdomen has revealed pancreatitis : continue meropenem for now  ID folowing the case    ENDOCRINE  SSI    MUSCULOSKELETAL  scd's    Mobility: Poor  PT/OT: PT consulted and on board and OT consulted and on board       DVT Prophylaxis: SCD's or Sequential Compression Device , enoxaparin  U - Ulcer Prophylaxis:   G - Glycemic Control: Insulin  B - Bowel Regimen:   Tubes: ETT  Lines: Central Line and [place]inton  Drains: Jackson-Pratt Drain (JP)    DISPOSITION/COMMUNICATION  Discussed Plan of Care/Code Status: Full Code  Stay in ICU    08/17  - I spoke to patients wife Mrs. Bilski at [phone]. She lives in [place]. I updated her, I also discussed potential tracheostomy if he is not able to maintain his airways (due to secretions). She is hoping for a transfer to [facility] area once patient is stable enough.    CCM time - 40 minutes.    Catha Brow, MD  Intensivist  06/18/2022

## 2022-06-18 NOTE — Other (Addendum)
[place]  [facility]  DIABETES MANAGEMENT CONSULT    Consulted by Provider for advanced nursing evaluation and care for inpatient blood glucose management.    Evaluation and Action Plan   This 76 year old Caucasian male was admitted with RUQ pain and underwent laparotomy 06/01/22. Was in septic shock with multi-organ failure. Several episodes of Vtach. Remained on vent support, pressors & CRRT. A new finding of liver abscess noted on CT; drain placed with diminishing output. There is concern for LLE viability due to diminished pedal pulses. Ischemia of first 2 toes bilaterally. Per vascular, he has not been a candidate for endovascular or surgical revascularization at this time. Moved out of ICU but experienced an acute decline (hypotension, arrhythmia & AMS) 06/14/22 & was returned to ICU. Again intubated & mildly sedated and on one pressor now. Infectious disease following & remains on multiple antibiotics. Feeding by Dobhoff. CRRT per nephrology.    As for BG management in this patient with known Type 2 diabetes, began low dose basal insulin 06/02/22 along with corrective insulin to address impact of steroid. Bgs had been 170-180s but continued to rise. Insulin adjusted on 06/05/22. HC stopped 06/07/22. Bgs subsequently in target. High residuals resulted in TF being stopped; hence, did not required any insulin 06/11/22. Has not passed swallow test again 06/15/22. TF restarted at 10cc/hr. Bgs in 120-170s. Corrective approach has been in use. TF running @ goal rate now. Bgs 183 @4am . Will raise dose slightly since he also required 2 units of correction.    Management Rationale Action Plan   Medication   TF Running at goal rate of 30cc/hr (115 CHO/D)   Increase Lantus insulin 12 units D    Switch to LOW corrective approach     Additional orders               Initial Presentation   Jaime [name] is a 76 y.o. male admitted 05/31/22 from ER after experiencing RUQ pain associated with generalized weakness,  nausea & vomiting for several days PTA. AMS+. Hypotension+. Fever+. Dyspneic+. He is visiting from 06/02/22.  Ectopic atrial tachycardia  ER exam:  Cardiovascular:      Rate and Rhythm: Regular rhythm. Tachycardia present.   Pulmonary:      Comments: He is tachypneic, coarse breath sounds on inspiration at bilateral bases  Abdominal:      Palpations: Abdomen is soft.      Comments: Tender to palpation diffusely in the right sided abdomen without guarding or rebound tenderness   LAB: WBC 16.6. Normal H&H. Low platelets.BG 272/AG 17. AKI. Elevated liver enzymes. NT pro-BNP 20,980. Lipase >3000.     CXR:   Right IJ catheter satisfactory position without pneumothorax. ET   tube and NG tube as above   CT Head: Negative  CT ABd:   Extraluminal air bubbles in the right upper quadrant concerning for gastric   perforation. Inflammatory changes are noted about the tail of the pancreas   extending into the left anterior pararenal space, please correlate with any   evidence of acute pancreatitis. Bilateral lower lobe infiltrates.     [place] reviewed. No pertinent past medical history.     INITIAL DX: Septicemia (HCC) [A41.9]  Gastric perforation (HCC) [K25.5]  Perforated abdominal viscus [R19.8]     Current Treatment     TX: 06/01/22 LAPAROTOMY EXPLORATORY  LAPAROSCOPY DIAGNOSTIC ENDOSCOPY OF ILEAL CONDUIT  EGD    Hospital Course   Clinical progress has been complicated by  multi-organ failure.   06/01/22 Dialysis  06/02/22 In septic shock with multi-organ failure. On vent support, sedation, pressor support and bicarb infusion. On Abx. Two runs of VT this morning requiring defibrillation; cardiology conversation anticipated. CRRT+  06/03/22 Remains on vent support C vent Fi02 50%/Peep 7. Low grade fever; on Abx. Steroids+. Afib/bradycardic. Pressors+. CRRT+.   06/04/22 Remains on vent support and pressors. On Abx. Concern for LLE. CRRT. CT: Enlarged left hepatic abscess contain gas with other areas of probable  abscess/phlegmonous infection.  06/05/22 On AC vent support. On Amio, vaso, levo & Fentanyl infusions. OG draining green fluid. CRRT+.  Vascular: Patient remains at risk for progressive deterioration and limb loss but needs to be weaned from pressor support before intervention is safe.  06/08/22 On Spon vent support; plans to extubate after CT today. Fentanyl pushes. H&H low. CRRT+. Mottled & blue toes bilaterally. Vascular AKI study to be completed  06/09/22 Alert. 02NC. Amio infusion+. Pedal pulses returning (no need to use doppler)  06/10/22 Alert. 02NC. NSR. Off pressors. TF running. Swallow eval today => may begin CL. Undergoing hemodialysis now. Continued cyanosis of bilateral toes.  06/11/22 Alert & conversational today  06/12/22 A&O. Failed swallow; allowed ice chips. Podiatry to see for LE ischemia  06/15/22 Intubated & sedated on pressor support. TF restarted at trickle rate. Dialysis planned per nephrology.  06/16/22 Eyes open. Intubated & sedated on Precedex. ST. Hypotensive on epi infusion. Amio+. TF running @10cc /hr. CVVH+.   06/17/22 Eyes closed. Intubated & sedated on Precedex. Spon vent Fi02 50/Peep 5; trying to wean. NSR. Amio +. TF @goal  rate. CVVH+. Wound care involved  06/18/22 Eyes open. Following conversation. Raising left arm. Intubated on spon vent support & mildly sedated; will try again to wean from vent. TF running at goal rate. May get trached. Blood transfusion+    Diabetes History   Type 2 diabetes on metformin therapy  Checks Bgs and they are in the 100s  Is physically active    Diabetes-related Medical History deferred    Diabetes Medication History - based on PTA list  Drug class Currently in use   Biguanide [x]  Metformin (Glucophage)  []  Metformin ER (Glucophage XL)     Diabetes self-management practices: deferred  Overall evaluation:    [x]  Unknown A1c     Subjective   Nodding     Objective   Physical exam  General Obese male who is in no acute distress  Neuro  Eyes open. Following  conversation. Mildly sedated  Vital Signs   Vitals:    06/18/22 0900   BP: (!) 134/48   Pulse: 56   Resp: 18   Temp:    SpO2: 98%     Extremities    Diabetic foot exam:    Left Foot     Visual Exam: Cool & dry. Thickened toenails. 3 toes gangrenous    Pulse DP: +1   Right Foot   Visual Exam: Cool & dry. Thickened toenails . 4 toes gangrenous   Pulse DP: +1      Laboratory  Recent Labs     06/16/22  0344 06/17/22  0309 06/18/22  0419   WBC 19.2* 18.4* 15.0*   HGB 7.1* 7.4* 6.8*   HCT 21.7* 24.6* 22.9*   MCV 95.2 100.8* 104.1*   PLT 224 235 169       Recent Labs     06/16/22  0926 06/17/22  0309 06/18/22  0419   NA 133* 135* 134*  K 4.1 3.7 3.1*   CL 101 103 103   CO2 24 26 26    PHOS 4.0 3.1 2.2*   BUN 43* 34* 29*   CREATININE 2.81* 1.88* 1.64*       Lab Results   Component Value Date    ALT 48 06/17/2022    AST 96 (H) 06/17/2022    ALKPHOS 223 (H) 06/17/2022    BILITOT 2.7 (H) 06/17/2022     No results found for: TSH, TSHREFLEX, TSHFT4, TSHELE, TSH3GEN, TSHHS   Lab Results   Component Value Date    LABA1C 7.7 (H) 06/02/2022     Factors impacting BG management  Factor Dose Comments   Nutrition:  TF   @target  rate of 30cc/hr  (115 CHO/D)   Running goal rate   Drug PRBC 06/18/22    Pain Fent PRN    Infection Merrem Q12 hrs  Cubicin & Diflucan Q24 hrs WBC trending down   Other:   Kidney function  Liver function   AKI  Liver enzymes elevated   CRRT     Blood glucose pattern      Significant diabetes-related events  05/31/22 Admission BG 233  06/01/22 Dex 4mg  => BG 100s => HC started  06/02/22 Bgs into 200s  06/03/22 Bgs in 170-180s  06/04/22 Bgs rising abit. HC reduced frequency today. Trickle feeds to begin  06/05/22 Bgs rising into 200s  06/08/22 Bgs in target  06/09/22 Bgs rising with start of TF at higher rate  06/10/22 Bgs rose abit with TF @30cc /hr  06/11/22 Bgs 108, 113. No insulin given  06/12/22 No insulin given. No nutrition. AKI  06/14/22 Acute decline in health status after dialysis => back to ICU  06/15/22 TF restarted  06/16/22  TF still at 10cc/hr  06/17/22 TF at goal rate. Bgs 190s  06/18/22 Tfs running at goal rate. FBG 183    Assessment and Nursing Intervention   Nursing Diagnosis Risk for unstable blood glucose pattern   Nursing Intervention Domain 5250 Decision-making Support   Nursing Interventions Examined current inpatient diabetes/blood glucose control   Explored factors facilitating and impeding inpatient management  Explored corrective strategies with patient and responsible inpatient provider   Informed patient of rational for insulin strategy while hospitalized     Billing Code(s)   []  99233 IP subsequent hospital care - 50 minutes   [x]  99232 IP subsequent hospital care - 35 minutes   []  99231 IP subsequent hospital care - 25 minutes   []  99221 IP initial hospital care - 40 minutes     Before making these care recommendations, I personally reviewed the hospitalization record, including notes, laboratory & diagnostic data and current medications, and examined the patient at the bedside (circumstances permitting) before determining care. More than fifty (50) percent of the time was spent in patient counseling and/or care coordination.  Total minutes: 35    06/17/22, APRN - CNS  Clinical Nurse Specialist - Diabetes & endocrine disorders  [facility]  Access via [employer]

## 2022-06-18 NOTE — Progress Notes (Signed)
Blood consent:08/17/2      Wife consented for blood transfusion. Risk vs. Benefits vs alternatives were discussed.     Catha Brow, MD, FCCP, FCCM, ATSF, FACP, [place]  Interventional Pulmonology/Critical Care Medicine  [facility]  [facility]  [place]

## 2022-06-18 NOTE — Progress Notes (Signed)
Comprehensive Nutrition Assessment    Type and Reason for Visit:  Reassess    Nutrition Recommendations/Plan:   Continue current TF as ordered for now  Will consider possibly switching to standard formula if lytes remain low after transition to intermittent HD     Malnutrition Assessment:  Malnutrition Status:  Insufficient data (06/04/22 1319)        Nutrition Assessment:  Chart reviewed, case discussed during CCU rounds.  Pt remains intubated, needs re-estimated.  TF at goal rate with minimal residuals.  This meets 90% kcal and 100% protein needs.  K 3.1 and phos 2.2 today (first day they have been low), he remains on CVVHDF.  Plans to transition to intermittent HD, so will keep Renal formula and monitor lytes for potential formula switch.  BG in the 200's, diabetes team is adjusting insulin.  Levo at 6.  Backed down on bowel regimen given loose stools.        Nutrition Related Findings:    Meds: humalog, prevacid, glycolax, lantus, flagyl, efferK, difulcan, cefepime, Kphos; Drips: levo.  Edema: +2 pitting-generalized, +2-all extremities.  BM: FMS Wound Type: Surgical Incision, Deep Tissue Injury   (sacrum)    Current Nutrition Intake & Therapies:    Average Meal Intake: NPO     Current Tube Feeding (TF) Orders:  Feeding Route: Nasogastric  Formula: Renal Formula  Schedule: Continuous  Feeding Regimen: 67m/h  Additives/Modulars: Protein (Prosource 4x)  Water Flushes: 531mq 4h  Current TF & Flush Orders Provides: 1616kcals/138gPro/115gCHO/82327m  Anthropometric Measures:  Height: 172.7 cm (5' 8" )  Ideal Body Weight (IBW): 154 lbs (70 kg)       Current Body Weight: 219 lb 12.8 oz (99.7 kg), 154.2 % IBW. Weight Source: Bed Scale  Current BMI (kg/m2): 33.4                          BMI Categories: Obese Class 1 (BMI 30.0-34.9)    Estimated Daily Nutrient Needs:  Energy Requirements Based On: Formula  Weight Used for Energy Requirements: Current  Energy (kcal/day): PSU 1773 (MSJ 1707)  Weight Used for Protein  Requirements: Ideal  Protein (g/day): 119-140g (1.7-2gPro/kg IBW)  Method Used for Fluid Requirements: Other (Comment)  Fluid (ml/day): per MD    Nutrition Diagnosis:   Inadequate protein-energy intake related to impaired respiratory function as evidenced by NPO or clear liquid status due to medical condition  Previous dx resolved.     Nutrition Interventions:   Food and/or Nutrient Delivery: Continue Current Tube Feeding, Modify Tube Feeding  Nutrition Education/Counseling: No recommendation at this time  Coordination of Nutrition Care: Continue to monitor while inpatient, Interdisciplinary Rounds       Goals:  Previous Goal Met: Goal(s) Achieved  Goals: Tolerate nutrition support at goal rate, by next RD assessment (with residuals <500m70md lytes WNL)       Nutrition Monitoring and Evaluation:   Behavioral-Environmental Outcomes: None Identified  Food/Nutrient Intake Outcomes: Enteral Nutrition Intake/Tolerance, IVF Intake  Physical Signs/Symptoms Outcomes: Biochemical Data, Nutrition Focused Physical Findings, Skin, Weight, GI Status, Hemodynamic Status, Fluid Status or Edema    Discharge Planning:    Too soon to determine     KathEarney Mallet, CNSC  Contact: ext [phone]

## 2022-06-18 NOTE — Progress Notes (Signed)
Spiritual Care Assessment/Progress Note  [facility]    Name: Jaime [name] MRN: [id number]    Age: 76 y.o.     Sex: male   Language: English     Date: 06/18/2022            Total Time Calculated: 10 min              Spiritual Assessment begun in [facility] CRITICAL CARE  Service Provided For:: Patient  Referral/Consult From:: Palliative Care  Encounter Overview/Reason : Initial Encounter    Spiritual beliefs:      []  Involved in a faith tradition/spiritual practice:      []  Supported by a faith community:      []  Claims no spiritual orientation:      []  Seeking spiritual identity:           []  Adheres to an individual form of spirituality:      [x]  Not able to assess:                Identified resources for coping and support system:   Support System: Spouse       []  Prayer                  []  Devotional reading               []  Music                  []  Guided Imagery     []  Pet visits                                        []  Other: (COMMENT)     Specific area/focus of visit   Encounter:    Crisis:    Spiritual/Emotional needs:    Ritual, Rites and Sacraments:    Grief, Loss, and Adjustments:    Ethics/Mediation:    Behavioral Health:    Palliative Care: Type: Palliative Care, Initial/Spiritual Assessment  Advance Care Planning:           Narrative: Initial spiritual assessment with palliative pt in 2522. Pt intubated, spoke with RN who updated chaplain. Pt opened eyes, chaplain held pt's hand and extended words of encouragement and reassurance. Pt squeezed chaplains hand. Extended blessing, pt appeared to respond well to this. Advised of availability of chaplains for support. Spouse is in , hopes are for pt to be transferred to . Acknowledged pt's [employer] service as well.

## 2022-06-18 NOTE — Progress Notes (Signed)
Surgery NP Note    Patient with no surgical needs at this time.     Remove staples and JP drains.     Accordion drain to stay in place until otherwise directed by IR.     Surgery to sign off. Reconsult if needed.     Charlton Haws, APRN - NP

## 2022-06-18 NOTE — Progress Notes (Incomplete)
1900: Bedside shift change report given to Jamiel Goncalves (Cabin crew) by Dimas Aguas (offgoing nurse). Report included the following information Nurse Handoff Report, Index, Intake/Output, MAR, Recent Results, and Cardiac Rhythm NSR .     2000:

## 2022-06-18 NOTE — Progress Notes (Signed)
Palliative Medicine  Jaime [name] is a 76 y.o. with a past history of HTN, HLD, and DM2, who was admitted on 05/31/2022 while traveling in [place] for Nascar race, with a diagnosis of Perforated Viscus seen on CT, Septic Shock, and AKI.     Code Status: Full Code     Advance Care Planning:  Demographics 06/08/2022   Marital Status Married    No AMD on file. Spouse is legal nok.     Patient / Family Encounter Documentation     Participants (names): spouse Jaime [name], (by phone), Jaime [name], Jaime Leatherwood, LCSW     Narrative: Palliative SW checked in on patient after consulting his nurse, Jaime [name], who was managing his blood transfusion. Patient was alert and awake and receptive to encounter, nodding his head, reaching for this writer's hand.     LCSW held his hand, provided calm, caring presence and words of encouragement and affirmation.  Asked if he would like this writer to call his wife outside the room and he nodded "Yes."      Palliative SW called Mrs. Herendeen and provided emotional support as she was tearful at times sharing how difficult it is to be so far away. LCSW encouraged her self care and keeping in touch with patient's care team. She shared that she was asked to consider doing a tracheotomy. She has limited resources to make the trip from [facility] to [place].       Psychosocial Issues Identified/ Resilience Factors:   Patient is a veteran of the [name] [employer]. He is retired now and has VA benefits, but a high co-pay for his medication due to his income.  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 His wife uses O2 and has a Designer, jewellery. She ambulates in a wheelchair. She is buying groceries for [name] and paying bills while patient is hospitalized.     Patient and spouse will be married 35 years on August 20. His best friend is Financial planner. They have also known each other for many years and Karren Burly was with patient on his trip to [place] and urged him to go to the hospital, for which  patient is grateful.     Caregiver Burden: High  Does the caregiver feel confident administering medication? Not discussed.  Does the caregiver need any help connecting with community resources? Yes  Does the caregiver feel confident assisting with activities of daily living? No.     Goals of Care / Plan:   Patient's symptoms will be managed.  Patient wants to transfer as soon as medically stable (by 8/20) to a hospital in [place], .  Spouse now has his wedding ring, which was tight on his finger and removed by ICU nurse.  Patient will be offered the opportunity to complete an AMD if he is interested and able during this hospitalization.  Palliative team will continue to provide education and support as appropriate to patient and family.     Thank you for including Palliative Medicine in the care of [name].     Jaime Leatherwood, LCSW  [phone])

## 2022-06-19 LAB — TYPE AND SCREEN
ABO/Rh: A POS
Antibody Screen: NEGATIVE
Dispense Status Blood Bank: TRANSFUSED
Unit Divison: 0

## 2022-06-19 LAB — BASIC METABOLIC PANEL
Anion Gap: 5 mmol/L (ref 5–15)
BUN: 34 MG/DL — ABNORMAL HIGH (ref 6–20)
Bun/Cre Ratio: 15 (ref 12–20)
CO2: 26 mmol/L (ref 21–32)
Calcium: 8.4 MG/DL — ABNORMAL LOW (ref 8.5–10.1)
Chloride: 105 mmol/L (ref 97–108)
Creatinine: 2.26 MG/DL — ABNORMAL HIGH (ref 0.70–1.30)
Est, Glom Filt Rate: 30 mL/min/{1.73_m2} — ABNORMAL LOW (ref >60)
Glucose: 170 mg/dL — ABNORMAL HIGH (ref 65–100)
Potassium: 3.7 mmol/L (ref 3.5–5.1)
Sodium: 136 mmol/L (ref 136–145)

## 2022-06-19 LAB — POCT GLUCOSE
POC Glucose: 226 mg/dL — ABNORMAL HIGH (ref 65–117)
POC Glucose: 209 mg/dL — ABNORMAL HIGH (ref 65–117)
POC Glucose: 183 mg/dL — ABNORMAL HIGH (ref 65–117)
POC Glucose: 208 mg/dL — ABNORMAL HIGH (ref 65–117)
POC Glucose: 215 mg/dL — ABNORMAL HIGH (ref 65–117)

## 2022-06-19 LAB — CBC
Hematocrit: 27.1 % — ABNORMAL LOW (ref 36.6–50.3)
Hemoglobin: 8.1 g/dL — ABNORMAL LOW (ref 12.1–17.0)
MCH: 30.7 PG (ref 26.0–34.0)
MCHC: 29.9 g/dL — ABNORMAL LOW (ref 30.0–36.5)
MCV: 102.7 FL — ABNORMAL HIGH (ref 80.0–99.0)
MPV: 11.1 FL (ref 8.9–12.9)
Nucleated RBCs: 0.1 PER 100 WBC — ABNORMAL HIGH
Platelets: 166 10*3/uL (ref 150–400)
RBC: 2.64 M/uL — ABNORMAL LOW (ref 4.10–5.70)
RDW: 19.3 % — ABNORMAL HIGH (ref 11.5–14.5)
WBC: 16.4 10*3/uL — ABNORMAL HIGH (ref 4.1–11.1)
nRBC: 0.02 10*3/uL — ABNORMAL HIGH (ref 0.00–0.01)

## 2022-06-19 LAB — HEPATIC FUNCTION PANEL
ALT: 45 U/L (ref 12–78)
AST: 84 U/L — ABNORMAL HIGH (ref 15–37)
Albumin/Globulin Ratio: 0.4 — ABNORMAL LOW (ref 1.1–2.2)
Albumin: 1.7 g/dL — ABNORMAL LOW (ref 3.5–5.0)
Alk Phosphatase: 293 U/L — ABNORMAL HIGH (ref 45–117)
Bilirubin, Direct: 1.4 MG/DL — ABNORMAL HIGH (ref 0.0–0.2)
Globulin: 4.2 g/dL — ABNORMAL HIGH (ref 2.0–4.0)
Total Bilirubin: 2.5 MG/DL — ABNORMAL HIGH (ref 0.2–1.0)
Total Protein: 5.9 g/dL — ABNORMAL LOW (ref 6.4–8.2)

## 2022-06-19 LAB — PHOSPHORUS: Phosphorus: 2.9 MG/DL (ref 2.6–4.7)

## 2022-06-19 LAB — MAGNESIUM: Magnesium: 2.2 mg/dL (ref 1.6–2.4)

## 2022-06-19 MED ORDER — MIDODRINE HCL 5 MG PO TABS
5 | Freq: Every day | ORAL | Status: DC | PRN
Start: 2022-06-19 — End: 2022-08-03
  Administered 2022-06-20 – 2022-08-03 (×2): 5 mg via ORAL

## 2022-06-19 MED ORDER — MIDODRINE HCL 5 MG PO TABS
5 | Freq: Three times a day (TID) | ORAL | Status: DC
Start: 2022-06-19 — End: 2022-06-20
  Administered 2022-06-19 – 2022-06-20 (×3): 5 mg via ORAL

## 2022-06-19 MED ORDER — INSULIN GLARGINE 100 UNIT/ML SC SOLN
100 | Freq: Every evening | SUBCUTANEOUS | Status: DC
Start: 2022-06-19 — End: 2022-06-24
  Administered 2022-06-20 – 2022-06-24 (×5): 16 [IU] via SUBCUTANEOUS

## 2022-06-19 MED ORDER — INSULIN GLARGINE 100 UNIT/ML SC SOLN
100 UNIT/ML | Freq: Every evening | SUBCUTANEOUS | Status: DC
Start: 2022-06-19 — End: 2022-06-19

## 2022-06-19 MED FILL — FLUCONAZOLE IN SODIUM CHLORIDE 200-0.9 MG/100ML-% IV SOLN: INTRAVENOUS | Qty: 100

## 2022-06-19 MED FILL — FENTANYL CITRATE (PF) 100 MCG/2ML IJ SOLN: 100 MCG/2ML | INTRAMUSCULAR | Qty: 2

## 2022-06-19 MED FILL — HUMALOG 100 UNIT/ML IJ SOLN: 100 UNIT/ML | INTRAMUSCULAR | Qty: 1

## 2022-06-19 MED FILL — BPCO EX OINT: CUTANEOUS | Qty: 60

## 2022-06-19 MED FILL — LANTUS 100 UNIT/ML SC SOLN: 100 UNIT/ML | SUBCUTANEOUS | Qty: 1

## 2022-06-19 MED FILL — METRONIDAZOLE 500 MG/100ML IV SOLN: 500 MG/100ML | INTRAVENOUS | Qty: 100

## 2022-06-19 MED FILL — RISAQUAD-2 PO CAPS: ORAL | Qty: 1

## 2022-06-19 MED FILL — HEPARIN SODIUM (PORCINE) 5000 UNIT/ML IJ SOLN: 5000 UNIT/ML | INTRAMUSCULAR | Qty: 1

## 2022-06-19 MED FILL — CEFEPIME HCL 1 G IJ SOLR: 1 g | INTRAMUSCULAR | Qty: 1000

## 2022-06-19 MED FILL — LANSOPRAZOLE 30 MG PO TBDD: 30 MG | ORAL | Qty: 1

## 2022-06-19 MED FILL — MIDODRINE HCL 5 MG PO TABS: 5 MG | ORAL | Qty: 1

## 2022-06-19 MED FILL — AMIODARONE HCL 200 MG PO TABS: 200 MG | ORAL | Qty: 2

## 2022-06-19 NOTE — Wound Image (Signed)
Wound care consult for the Abdominal wound that needs a Wound Vac and the reassessment of the Sacrum / Buttocks DTI pressure injury and the toes of both feet.  Yesterday some staples were removed from the surgical site on the abdomen and a few hours later the wound was dehisced.   Pt. Is post surgical 3 weeks ago and is still recovering. He is still on 1 mcg of a Vasopressor.   Assessment: Pt. Is still intubated on the Ventilator but he is awake and shakes his head and acknowledges staff presence at the bedside. Feedings through the feeding tube.   Left nare is a wound that covered with a thin layer of eschar. Some of it was loose and removed to allow the Venelex to work better. Wound bed underneath is blanchable.   The toes are black but the feet are starting to clear up some on the distal foot. Pt. Moves his feet and legs fairly well and can do calf pumps well. New photos obtained today. Treated with Betadine swabs and the distal foot with the Venelex ointment (along with the heels).   Right toes / feet: 06/19/22        Left foot and toes: 06/19/22        The Sacrum / buttocks wound is opening well and revealing a pink wound bed. The buttocks aspect of the wound is still demarcating / breaking down.   Treated with the Venelex with the assessment / cleaning of the skin.   Pt. Still needs the Fecal management system. He has loose / watery stools.      Abdomen / Surgical Wound:  06/19/22  Dehisced yesterday. Adipose tissue,   subcutaneous tissue and blood clots in   the wound bed with pale granulation at   the top of the wound only and some on   the edges      Treatment: the Abdomen wound was cleansed with Vashe solution and gauze and the loosely intact parts just fell apart. The fascia still appears to be intact. Wound bed treated with silver hydrogel and then the black Granufoam sponge with Negative pressure wound therapy. Occlusively sealed and the Trac pad applied to the Wound dressing and pressure set at 125 mmHg.  The dressing drew down well as the abdomen was approximated manually by [facility] nurse. Wound Vac   # GAAC 16109 used for this patient.   Other treatments explained above with photos / assessments.   Plan: continue to treat the current pressure injury and prevention of further wounds with turning and with floating the heels. Pt. Understands / acknowledges explanations of treatments.    Wound Vac dressing change to occur on Monday next week.   Ronnald Collum, RN, BSN, [employer]

## 2022-06-19 NOTE — Progress Notes (Signed)
EP/ ARRHYTHMIA Progress Note    Patient ID:  Patient: Jaime [name]  MRN: [id number]  Age: 76 y.o.  DOB: 1946/03/30    Date of  Admission: 05/31/2022 11:25 PM   PCP:  No primary care provider on file.  Usual cardiologist:  Jodelle Red, MD in [facility] [phone]    Assessment:   Sustained monomorphic VT but also with some salvoes earlier this admission when he was quite sick.  Suspect this is ectopic and due to critical illness and catecholamines.  He has not had recent recurrence.  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  Moderate aortic stenosis by outside echo 2020, limited study done here on 8/1.  Sepsis with GNR (E. Coli).  Diagnostic laparoscopy, laparotomy, EGD on 7/31.  IR drain to hepatic abscesses 8/4.  Multiorgan dysfunction, shock.  Severe thrombocytopenia, improved.  Full code.    Plan:     Continue amiodarone by NGT.  Volume removal by CVVHD.    Supportive care from a cardiac standpoint.  I'll check back in a week.  Call if needed.      [x]        High complexity decision making was performed in this patient at high risk for decompensation with multiple organ involvement.    Jaime [name] is a 76 y.o. male with a history of the above.  I was asked to consult due to recurrent VT requiring repeated amiodarone and shock attempts.     History reviewed. No pertinent past medical history.     Past Surgical History:   Procedure Laterality Date    BLADDER SURGERY N/A 06/01/2022    ENDOSCOPY OF ILEAL CONDUIT performed by 06/03/2022, MD at [facility] MAIN OR    CT VISCERAL PERCUTANEOUS DRAIN  06/05/2022    CT VISCERAL PERCUTANEOUS DRAIN 06/05/2022 [facility] RAD CT    LAPAROSCOPY N/A 06/01/2022    LAPAROSCOPY DIAGNOSTIC performed by 06/03/2022, MD at [facility]    LAPAROTOMY N/A 06/01/2022    LAPAROTOMY EXPLORATORY performed by 06/03/2022, MD at [facility]       Social History     Tobacco Use    Smoking status: Never    Smokeless tobacco: Never   Substance Use Topics     Alcohol use: Not on file        No family history on file.     Allergies   Allergen Reactions    Augmentin [Amoxicillin-Pot Clavulanate] Hives     Tolerated ceftriaxone 06/2022    Codeine      Unknown reaction          Current Facility-Administered Medications   Medication Dose Route Frequency    insulin glargine (LANTUS) injection vial 16 Units  16 Units SubCUTAneous Nightly    midodrine (PROAMATINE) tablet 5 mg  5 mg Oral 3 times per day    midodrine (PROAMATINE) tablet 5 mg  5 mg Oral Daily PRN    0.9 % sodium chloride infusion   IntraVENous PRN    amiodarone (CORDARONE) tablet 400 mg  400 mg Oral Daily    insulin lispro (HUMALOG) injection vial 0-4 Units  0-4 Units SubCUTAneous 4 times per day    cefepime (MAXIPIME) 1,000 mg in sodium chloride 0.9 % 50 mL IVPB (mini-bag)  1,000 mg IntraVENous Q24H    metronidazole (FLAGYL) 500 mg in 0.9% NaCl 100 mL IVPB premix  500 mg IntraVENous Q12H    fluconazole (DIFLUCAN) in 0.9 % sodium chloride IVPB 200 mg  200  mg IntraVENous Q24H    diatrizoate meglumine-sodium (GASTROGRAFIN) 66-10 % solution 30 mL  30 mL Oral ONCE PRN    dexmedetomidine (PRECEDEX) 400 mcg in sodium chloride 0.9 % 100 mL infusion  0.1-1.5 mcg/kg/hr IntraVENous Continuous    norepinephrine (LEVOPHED) 16 mg in sodium chloride 0.9 % 250 mL infusion  0.5-20 mcg/min IntraVENous Continuous    acidophilus probiotic capsule 1 capsule  1 capsule Oral Daily    alcohol 62% (NOZIN) nasal sanitizer 1 ampule  1 ampule Topical BID    bisacodyl (DULCOLAX) suppository 10 mg  10 mg Rectal Daily PRN    albumin human 25% IV solution 25 g  25 g IntraVENous PRN    heparin (porcine) injection 1,200 Units  1,200 Units IntraCATHeter PRN    And    heparin (porcine) injection 1,000 Units  1,000 Units IntraCATHeter PRN    heparin (porcine) injection 5,000 Units  5,000 Units SubCUTAneous 3 times per day    lansoprazole (PREVACID SOLUTAB) disintegrating tablet 30 mg  30 mg Per NG tube Daily    hydrALAZINE (APRESOLINE) injection 10 mg   10 mg IntraVENous Q4H PRN    sodium chloride 0.9 % bolus 100 mL  100 mL IntraVENous PRN    fentaNYL (SUBLIMAZE) injection 50 mcg  50 mcg IntraVENous Q1H PRN    balsum peru-castor oil (VENELEX) ointment   Topical BID    glucose chewable tablet 16 g  4 tablet Oral PRN    dextrose bolus 10% 125 mL  125 mL IntraVENous PRN    Or    dextrose bolus 10% 250 mL  250 mL IntraVENous PRN    glucagon (rDNA) injection 1 mg  1 mg SubCUTAneous PRN    dextrose 10 % infusion   IntraVENous Continuous PRN    sodium chloride flush 0.9 % injection 5-40 mL  5-40 mL IntraVENous 2 times per day    sodium chloride flush 0.9 % injection 5-40 mL  5-40 mL IntraVENous PRN    0.9 % sodium chloride infusion   IntraVENous PRN    acetaminophen (TYLENOL) tablet 650 mg  650 mg Oral Q6H PRN    Or    acetaminophen (TYLENOL) suppository 650 mg  650 mg Rectal Q6H PRN    ondansetron (ZOFRAN) injection 4 mg  4 mg IntraVENous Q6H PRN       Review of Symptoms:  He cannot communicate.     Objective:      Physical Exam:  Temp (24hrs), Avg:99.1 F (37.3 C), Min:98.8 F (37.1 C), Max:99.5 F (37.5 C)    Patient Vitals for the past 8 hrs:   Pulse   06/19/22 1600 80   06/19/22 1530 76   06/19/22 1515 75   06/19/22 1500 77   06/19/22 1445 72   06/19/22 1430 72   06/19/22 1415 74   06/19/22 1400 75   06/19/22 1345 78   06/19/22 1330 78   06/19/22 1315 77   06/19/22 1300 77   06/19/22 1245 78   06/19/22 1230 79   06/19/22 1215 80   06/19/22 1200 78   06/19/22 1145 77   06/19/22 1130 75   06/19/22 1115 80   06/19/22 1100 79   06/19/22 1045 79   06/19/22 1030 78   06/19/22 1015 77   06/19/22 1000 79   06/19/22 0945 80   06/19/22 0930 80      Patient Vitals for the past 8 hrs:   Resp   06/19/22 1600 19  06/19/22 1530 20   06/19/22 1515 23   06/19/22 1500 23   06/19/22 1445 20   06/19/22 1430 20   06/19/22 1415 21   06/19/22 1400 22   06/19/22 1345 23   06/19/22 1330 24   06/19/22 1315 22   06/19/22 1300 22   06/19/22 1245 22   06/19/22 1230 24   06/19/22 1215 23    06/19/22 1200 23   06/19/22 1145 24   06/19/22 1130 21   06/19/22 1115 22   06/19/22 1100 21   06/19/22 1045 22   06/19/22 1030 21   06/19/22 1015 21   06/19/22 1000 23   06/19/22 0945 24   06/19/22 0930 20      Patient Vitals for the past 8 hrs:   BP   06/19/22 1600 (!) 151/58   06/19/22 1530 (!) 135/50   06/19/22 1515 (!) 142/51   06/19/22 1500 (!) 137/51   06/19/22 1445 (!) 128/48   06/19/22 1430 (!) 135/48   06/19/22 1415 (!) 136/47   06/19/22 1400 (!) 132/49   06/19/22 1345 (!) 128/56   06/19/22 1330 (!) 133/57   06/19/22 1315 (!) 128/52   06/19/22 1300 (!) 127/53   06/19/22 1245 (!) 131/55   06/19/22 1230 (!) 128/54   06/19/22 1215 (!) 131/58   06/19/22 1200 (!) 123/55   06/19/22 1145 (!) 115/56   06/19/22 1130 (!) 121/53   06/19/22 1115 (!) 129/54   06/19/22 1100 (!) 123/52   06/19/22 1045 (!) 120/54   06/19/22 1030 (!) 120/50   06/19/22 1015 (!) 114/47   06/19/22 1000 (!) 121/51   06/19/22 0945 (!) 125/51   06/19/22 0930 (!) 134/55          Intake/Output Summary (Last 24 hours) at 06/19/2022 1717  Last data filed at 06/19/2022 1601  Gross per 24 hour   Intake 2752.5 ml   Output 445 ml   Net 2307.5 ml         Nondiaphoretic, not in acute distress.  Unlabored, clear to auscultation bilaterally anteriorly, symmetric air movement.  Regular rate and rhythm, +soft systolic murmur, no pericardial rub.  No peripheral edema.  Extremities without cyanosis or clubbing.  Muscle tone and bulk normal for age.  Skin warm and dry.  Reduced perfusion to toes.  Sedated and on the ventilator.    CARDIOGRAPHICS and STUDIES, I reviewed:    Telemetry:  SINUS RHYTHM.    ECG:  Studies reviewed from the last 24 hours.    Echo 8/1:  Left Ventricle Normal left ventricular systolic function with a visually estimated EF of 55 - 60%. Not well visualized. Left ventricle size is normal. Increased wall thickness. Unable to assess wall motion. Diastolic dysfunction present with normal LV EF.   Left Atrium Not well visualized.   Right  Ventricle Not well visualized. Normal systolic function.   Right Atrium Not well visualized.   Aortic Valve Not well visualized. No regurgitation. No stenosis.   Mitral Valve Not well visualized. No regurgitation. No stenosis noted.   Tricuspid Valve Not well visualized. No regurgitation. No stenosis noted.   Pulmonic Valve The pulmonic valve was not well visualized.   Aorta Not well visualized. Normal sized sinus of Valsalva.   Pericardium No pericardial effusion.       Labs:  No results for input(s): CPK, CKMB in the last 72 hours.    Invalid input(s): CPKMB, CKNDX, TROIQ  No results found for: CHOL,  CHOLX, CHLST, CHOLV, HDL, HDLC, LDL, LDLC, TGLX, TRIGL  No results for input(s): INR, APTT in the last 72 hours.    Invalid input(s): PTP     Recent Labs     06/17/22  0309 06/18/22  0419 06/18/22  1742 06/19/22  0436   NA 135* 134*  --  136   K 3.7 3.1*  --  3.7   CL 103 103  --  105   CO2 26 26  --  26   BUN 34* 29*  --  34*   PHOS 3.1 2.2*  --  2.9   WBC 18.4* 15.0* 16.4* 16.4*   HGB 7.4* 6.8* 8.3* 8.1*   HCT 24.6* 22.9* 27.0* 27.1*   PLT 235 169 161 166       Recent Labs     06/17/22  0309 06/19/22  0436   GLOB 3.7 4.2*       No components found for: GLPOC  No results for input(s): PH, PCO2, PO2 in the last 72 hours.      Tamera Punt, MD 06/19/22 5:17 PM

## 2022-06-19 NOTE — Progress Notes (Signed)
Received report from Elsie Stain RN.  1800 No changes. VSS.  1910 End of Shift Note    Bedside shift change report given to Danielle P. RN (oncoming nurse) by Beryl Meager, RN (offgoing nurse).  Report included the following information SBAR, Kardex, OR Summary, Procedure Summary, Intake/Output, MAR, and Recent Results    Shift worked:  7am to [employer]     Shift summary and any significant changes:    See above note     Concerns for physician to address: See above note     Zone phone for oncoming shift:  N/a       Activity:     Number times ambulated in hallways past shift: 0  Number of times OOB to chair past shift: 0    Cardiac:   Cardiac Monitoring: Yes           Access:  Current line(s): central line and HD access     Genitourinary:   Urinary status: anuric    Respiratory:      Chronic home O2 use?: NO  Incentive spirometer at bedside: NO       GI:     Current diet:  ADULT TUBE FEEDING; Nasogastric; Renal Formula; Continuous; 20; Yes; 10; Q 8 hours; 30; 50; Q 4 hours; Protein; Prosource 4 x day  Passing flatus: YES  Tolerating current diet: YES       Pain Management:   Patient states pain is manageable on current regimen: YES    Skin:     Interventions: turn team and float heels    Patient Safety:  Fall Score:    Interventions: bed/chair alarm       Length of Stay:  Expected LOS: 5  Actual LOS: 18      Maycol Hoying C Chrissy Ealey, RN

## 2022-06-19 NOTE — Progress Notes (Signed)
Name: Jaime [name]   MRN: [id number]  DOB: 24-Feb-1946  06/19/2022 11:53 AM      Admit Date: 05/31/2022    Admit Diagnosis: Septicemia ([place]) [A41.9]  Gastric perforation ([place]) [K25.5]  Perforated abdominal viscus [R19.8]    Assessment/Plan:     Principal Problem:    Gastric perforation ([place])  Active Problems:    Type 2 diabetes mellitus with hyperglycemia, without long-term current use of insulin (HCC)    Intestinal obstruction (HCC)    Severe sepsis (HCC)    Septic shock (HCC)    Escherichia coli septicemia (HCC)    Liver abscess    Pneumoperitoneum    Acute respiratory failure with hypoxia (HCC)    AKI (acute kidney injury) ([place])    Multi-organ failure with heart failure (HCC)    Thrombocytopenia (HCC)    E coli bacteremia    Hepatic abscess    Peripheral arterial disease (HCC)    Hyperbilirubinemia    Gram negative sepsis (HCC)    Septicemia (HCC)    Aspiration pneumonia of both lower lobes (HCC)    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 *          06/19/22     Assessment:         AKI/ATN- anuric- Grade 3-  Resp failure Vent Dependent-FiO2 50%  Septic shock-still on low-dose pressor  Pancreatitis   Hyponatremia-sodium to 136  Hypoalbuminemia with albumin of 1.7 due to chronic inflammation  Lipase elevated at 737 suggestive of pancreatitis, along with CT scan  Encephalopathy  Acute abdomen from abdominal viscus perforation    Liver abscess  S/p emergent exploratory laparotomy 06/01/22    Type 2 DM  Shock liver  Acute on chronic anemia       Discussion/MDM: Patient with multiple medical comorbidities, each with high likelihood for morbidity and mortality if left untreated.  Patient being treated with medication that requires intensive monitoring due to increased risk for toxicity. I have reviewed patient's presenting subjective and objective findings, as  well as all laboratory studies, imaging studies, and vital signs to date as well as treatment rendered and patient's response to those treatments.  In addition, prior medical, surgical and relevant social and family histories were reviewed.        Discussion:       Held CVVHDF yesterday  Off sedatives  On 1 mic Levophed   CVVHDF 06/15/2022 to 06/18/22 Delivered dialysis dose 26 mL/kg/hr  iHD 8/12;  Right IJ quinton 8/8  CVVHD initiated 7/31 until 8/6;  Still in shock and pressor dependent  Severe PAD with gangrene of toes      Plan:     Hold off CVVHDF/IHD today.  Will reevaluate in the a.m. tomorrow.    Caren Griffins, MD    Dr Elenor Legato  Cell no- [phone]  Available on perfect serve.      Signed By: Caren Griffins, MD     June 19, 2022         Subjective:   Hospital day 27   Visited with him while his wound  dressing was being changed.  On pressure support.  FiO2 50%  Clinical events discussed with RN    Review of Systems:  Review of systems not obtained due to patient factors.   Objective:    BP (!) 130/52   Pulse 82  Temp 99.2 F (37.3 C) (Axillary)   Resp 23   Ht 1.727 m (5' 8" )   Wt 99.7 kg (219 lb 12.8 oz)   SpO2 98%   BMI 33.42 kg/m     Physical Exam:  Physical Exam  Constitutional:       Appearance: He is ill-appearing.   HENT:      Head: Atraumatic.      Nose: No congestion or rhinorrhea.   Cardiovascular:      Rate and Rhythm: Normal rate and regular rhythm.   Pulmonary:      Comments: Ventilator dependent  Abdominal:      General: There is distension.   Skin:     Findings: Bruising and lesion present.   Neurological:      Mental Status: He is disoriented.      Access- R IJ Dialysis access  L ij tLC  Abdominal wound  Peripheral gangrene noted in some toes-no change.  Fecal management system in place  Tube feeds on going    Recent Results (from the past 24 hour(s))   POCT Glucose    Collection Time: 06/18/22 12:44 PM   Result Value Ref Range    POC Glucose 191 (H) 65 - 117 mg/dL     Performed by: Arnell Sieving RN    CBC with Auto Differential    Collection Time: 06/18/22  5:42 PM   Result Value Ref Range    WBC 16.4 (H) 4.1 - 11.1 K/uL    RBC 2.67 (L) 4.10 - 5.70 M/uL    Hemoglobin 8.3 (L) 12.1 - 17.0 g/dL    Hematocrit 27.0 (L) 36.6 - 50.3 %    MCV 101.1 (H) 80.0 - 99.0 FL    MCH 31.1 26.0 - 34.0 PG    MCHC 30.7 30.0 - 36.5 g/dL    RDW 18.6 (H) 11.5 - 14.5 %    Platelets 161 150 - 400 K/uL    MPV 10.9 8.9 - 12.9 FL    Nucleated RBCs 0.2 (H) 0 PER 100 WBC    nRBC 0.04 (H) 0.00 - 0.01 K/uL    Neutrophils % 85 (H) 32 - 75 %    Band Neutrophils 2 %    Lymphocytes % 6 (L) 12 - 49 %    Monocytes % 7 5 - 13 %    Eosinophils % 0 0 - 7 %    Basophils % 0 0 - 1 %    Immature Granulocytes 0 0.0 - 0.5 %    Neutrophils Absolute 14.3 (H) 1.8 - 8.0 K/UL    Lymphocytes Absolute 1.0 0.8 - 3.5 K/UL    Monocytes Absolute 1.1 (H) 0.0 - 1.0 K/UL    Eosinophils Absolute 0.0 0.0 - 0.4 K/UL    Basophils Absolute 0.0 0.0 - 0.1 K/UL    Absolute Immature Granulocyte 0.0 0.00 - 0.04 K/UL    Differential Type MANUAL      RBC Comment ANISOCYTOSIS  1+        RBC Comment MACROCYTOSIS  PRESENT       POCT Glucose    Collection Time: 06/18/22  6:30 PM   Result Value Ref Range    POC Glucose 161 (H) 65 - 117 mg/dL    Performed by: Newt Lukes RN    POCT Glucose    Collection Time: 06/18/22  9:02 PM   Result Value Ref Range    POC Glucose 226 (H) 65 - 117 mg/dL    Performed by:  Popl[place]ski Danielle RN    POCT Glucose    Collection Time: 06/19/22 12:01 AM   Result Value Ref Range    POC Glucose 209 (H) 65 - 117 mg/dL    Performed by: Brandy Hale RN    Basic Metabolic Panel    Collection Time: 06/19/22  4:36 AM   Result Value Ref Range    Sodium 136 136 - 145 mmol/L    Potassium 3.7 3.5 - 5.1 mmol/L    Chloride 105 97 - 108 mmol/L    CO2 26 21 - 32 mmol/L    Anion Gap 5 5 - 15 mmol/L    Glucose 170 (H) 65 - 100 mg/dL    BUN 34 (H) 6 - 20 MG/DL    Creatinine 2.26 (H) 0.70 - 1.30 MG/DL    Bun/Cre Ratio 15 12 - 20      Est,  Glom Filt Rate 30 (L) >60 ml/min/1.97m    Calcium 8.4 (L) 8.5 - 10.1 MG/DL   Magnesium    Collection Time: 06/19/22  4:36 AM   Result Value Ref Range    Magnesium 2.2 1.6 - 2.4 mg/dL   Phosphorus    Collection Time: 06/19/22  4:36 AM   Result Value Ref Range    Phosphorus 2.9 2.6 - 4.7 MG/DL   Hepatic Function Panel    Collection Time: 06/19/22  4:36 AM   Result Value Ref Range    Total Protein 5.9 (L) 6.4 - 8.2 g/dL    Albumin 1.7 (L) 3.5 - 5.0 g/dL    Globulin 4.2 (H) 2.0 - 4.0 g/dL    Albumin/Globulin Ratio 0.4 (L) 1.1 - 2.2      Total Bilirubin 2.5 (H) 0.2 - 1.0 MG/DL    Bilirubin, Direct 1.4 (H) 0.0 - 0.2 MG/DL    Alk Phosphatase 293 (H) 45 - 117 U/L    AST 84 (H) 15 - 37 U/L    ALT 45 12 - 78 U/L   CBC    Collection Time: 06/19/22  4:36 AM   Result Value Ref Range    WBC 16.4 (H) 4.1 - 11.1 K/uL    RBC 2.64 (L) 4.10 - 5.70 M/uL    Hemoglobin 8.1 (L) 12.1 - 17.0 g/dL    Hematocrit 27.1 (L) 36.6 - 50.3 %    MCV 102.7 (H) 80.0 - 99.0 FL    MCH 30.7 26.0 - 34.0 PG    MCHC 29.9 (L) 30.0 - 36.5 g/dL    RDW 19.3 (H) 11.5 - 14.5 %    Platelets 166 150 - 400 K/uL    MPV 11.1 8.9 - 12.9 FL    Nucleated RBCs 0.1 (H) 0 PER 100 WBC    nRBC 0.02 (H) 0.00 - 0.01 K/uL   POCT Glucose    Collection Time: 06/19/22  5:43 AM   Result Value Ref Range    POC Glucose 183 (H) 65 - 117 mg/dL    Performed by: PBrandy HaleRN           Intake/Output Summary (Last 24 hours) at 06/19/2022 1153  Last data filed at 06/19/2022 0900  Gross per 24 hour   Intake 2718.36 ml   Output 1713.4 ml   Net 1004.96 ml            Data Review:   Recent Labs     06/19/22  0436   NA 136   K 3.7   BUN 34*   CREATININE 2.26*  WBC 16.4*   HGB 8.1*   HCT 27.1*   PLT 166         No current facility-administered medications on file prior to encounter.     Current Outpatient Medications on File Prior to Encounter   Medication Sig Dispense Refill    metFORMIN (GLUCOPHAGE) 500 MG tablet Take 1 tablet by mouth daily (Patient not taking: Reported on 06/08/2022)       metoprolol (LOPRESSOR) 100 MG tablet Take 1 tablet by mouth daily      hydrALAZINE (APRESOLINE) 25 MG tablet Take 1 tablet by mouth 3 times daily (Patient not taking: Reported on 06/08/2022)      atorvastatin (LIPITOR) 40 MG tablet Take 1 tablet by mouth daily (Patient not taking: Reported on 06/08/2022)      losartan (COZAAR) 100 MG tablet Take 1 tablet by mouth daily (Patient not taking: Reported on 06/08/2022)      metFORMIN (GLUCOPHAGE-XR) 500 MG extended release tablet Take 1 tablet by mouth daily (with breakfast) (Patient not taking: Reported on 06/08/2022)      hydroCHLOROthiazide (HYDRODIURIL) 25 MG tablet TAKE 1 TABLET BY MOUTH ONCE DAILY AS NEEDED (Patient not taking: Reported on 06/08/2022)      metoprolol succinate (TOPROL XL) 100 MG extended release tablet TAKE 1 TABLET BY MOUTH ONCE DAILY WITH OR IMMEDIATELY FOLLOWING A MEAL          Care Plan discussed with:  Patient     Family      RN x    Care Manager        Hospitalist    Consultant:            Comments   >50% of visit spent in counseling and coordination of care Y          Signed By: Caren Griffins, MD     June 19, 2022       This dictation was done by dragon, Acupuncturist.  Often unanticipated grammatical, syntax, phones and other interpretive errors are inadvertently transcribed.  Please excuse errors that have escaped final proofreading. Please contact me if you suspect dictation or transcription errors.      Dr Elenor Legato    Office No- [phone]  [facility]  [address]  [place] [address]    Fax: [phone]

## 2022-06-19 NOTE — Other (Signed)
[place]  [facility]  DIABETES MANAGEMENT CONSULT    Consulted by Provider for advanced nursing evaluation and care for inpatient blood glucose management.    Evaluation and Action Plan   This 76 year old Caucasian male was admitted with RUQ pain and underwent laparotomy 06/01/22. Was in septic shock with multi-organ failure. Several episodes of Vtach. Remained on vent support, pressors & CRRT. A new finding of liver abscess noted on CT; drain placed with diminishing output. There is concern for LLE viability due to diminished pedal pulses. Ischemia of first 2 toes bilaterally. Per vascular, he has not been a candidate for endovascular or surgical revascularization at this time. Moved out of ICU but experienced an acute decline (hypotension, arrhythmia & AMS) 06/14/22 & was returned to ICU. Again intubated & mildly sedated and on one pressor, both of which are stopped now. Infectious disease following & remains on multiple antibiotics. Feeding by Dobhoff. CRRT per nephrology.    As for BG management in this patient with known Type 2 diabetes, began low dose basal insulin 06/02/22 along with corrective insulin to address impact of steroid. Bgs had been 170-180s but continued to rise. Insulin adjusted on 06/05/22. HC stopped 06/07/22. Bgs subsequently in target. High residuals resulted in TF being stopped; hence, did not require any insulin 06/11/22. Has not passed swallow test again 06/15/22. TF restarted at 10cc/hr. Bgs in 120-170s. Corrective approach had been in use but TF is now running @ goal rate. Used a total of 14 units yesterday. Bgs 180-220s. Insulin dosing is being slowly advanced.    Management Rationale Action Plan   Medication   TF Running at goal rate of 30cc/hr (115 CHO/D)   Increase Lantus insulin 16 units D    Switch to LOW corrective approach     Additional orders               Initial Presentation   Jaime [name] is a 76 y.o. male admitted 05/31/22 from ER after experiencing RUQ pain  associated with generalized weakness, nausea & vomiting for several days PTA. AMS+. Hypotension+. Fever+. Dyspneic+. He is visiting from [place].  Ectopic atrial tachycardia  ER exam:  Cardiovascular:      Rate and Rhythm: Regular rhythm. Tachycardia present.   Pulmonary:      Comments: He is tachypneic, coarse breath sounds on inspiration at bilateral bases  Abdominal:      Palpations: Abdomen is soft.      Comments: Tender to palpation diffusely in the right sided abdomen without guarding or rebound tenderness   LAB: WBC 16.6. Normal H&H. Low platelets.BG 272/AG 17. AKI. Elevated liver enzymes. NT pro-BNP 20,980. Lipase >3000.     CXR:   Right IJ catheter satisfactory position without pneumothorax. ET   tube and NG tube as above   CT Head: Negative  CT ABd:   Extraluminal air bubbles in the right upper quadrant concerning for gastric   perforation. Inflammatory changes are noted about the tail of the pancreas   extending into the left anterior pararenal space, please correlate with any   evidence of acute pancreatitis. Bilateral lower lobe infiltrates.     ZO:[id number] reviewed. No pertinent past medical history.     INITIAL DX: Septicemia (HCC) [A41.9]  Gastric perforation (HCC) [K25.5]  Perforated abdominal viscus [R19.8]     Current Treatment     TX: 06/01/22 LAPAROTOMY EXPLORATORY  LAPAROSCOPY DIAGNOSTIC ENDOSCOPY OF ILEAL CONDUIT  EGD    Hospital Course  Clinical progress has been complicated by multi-organ failure.   06/01/22 Dialysis  06/02/22 In septic shock with multi-organ failure. On vent support, sedation, pressor support and bicarb infusion. On Abx. Two runs of VT this morning requiring defibrillation; cardiology conversation anticipated. CRRT+  06/03/22 Remains on vent support C vent Fi02 50%/Peep 7. Low grade fever; on Abx. Steroids+. Afib/bradycardic. Pressors+. CRRT+.   06/04/22 Remains on vent support and pressors. On Abx. Concern for LLE. CRRT. CT: Enlarged left hepatic abscess contain gas with  other areas of probable abscess/phlegmonous infection.  06/05/22 On AC vent support. On Amio, vaso, levo & Fentanyl infusions. OG draining green fluid. CRRT+.  Vascular: Patient remains at risk for progressive deterioration and limb loss but needs to be weaned from pressor support before intervention is safe.  06/08/22 On Spon vent support; plans to extubate after CT today. Fentanyl pushes. H&H low. CRRT+. Mottled & blue toes bilaterally. Vascular AKI study to be completed  06/09/22 Alert. 02NC. Amio infusion+. Pedal pulses returning (no need to use doppler)  06/10/22 Alert. 02NC. NSR. Off pressors. TF running. Swallow eval today => may begin CL. Undergoing hemodialysis now. Continued cyanosis of bilateral toes.  06/11/22 Alert & conversational today  06/12/22 A&O. Failed swallow; allowed ice chips. Podiatry to see for LE ischemia  06/15/22 Intubated & sedated on pressor support. TF restarted at trickle rate. Dialysis planned per nephrology.  06/16/22 Eyes open. Intubated & sedated on Precedex. ST. Hypotensive on epi infusion. Amio+. TF running @10cc /hr. CVVH+.   06/17/22 Eyes closed. Intubated & sedated on Precedex. Spon vent Fi02 50/Peep 5; trying to wean. NSR. Amio +. TF @goal  rate. CVVH+. Wound care involved  06/18/22 Eyes open. Following conversation. Raising left arm. Intubated on spon vent support & mildly sedated; will try again to wean from vent. TF running at goal rate. May get trached. Blood transfusion+  06/19/22 On sp vent support. Concerning trach. Off pressor. Dobhoff with TF. Anuric. HD today    Diabetes History   Type 2 diabetes on metformin therapy  Checks Bgs and they are in the 100s  Is physically active    Diabetes-related Medical History deferred    Diabetes Medication History - based on PTA list  Drug class Currently in use   Biguanide [x]  Metformin (Glucophage)  []  Metformin ER (Glucophage XL)     Diabetes self-management practices: deferred  Overall evaluation:    [x]  Unknown A1c     Subjective   Follows  commands and nodding     Objective   Physical exam  General Obese male who is in no acute distress  Neuro  Eyes open. Following conversation  Vital Signs   Vitals:    06/19/22 0915   BP: (!) 130/52   Pulse: 82   Resp: 23   Temp:    SpO2: 98%     Extremities    Diabetic foot exam:    Left Foot     Visual Exam: Cool & dry. Thickened toenails. 3 toes gangrenous    Pulse DP: +1   Right Foot   Visual Exam: Cool & dry. Thickened toenails . 4 toes gangrenous   Pulse DP: +1      Laboratory  Recent Labs     06/18/22  0419 06/18/22  1742 06/19/22  0436   WBC 15.0* 16.4* 16.4*   HGB 6.8* 8.3* 8.1*   HCT 22.9* 27.0* 27.1*   MCV 104.1* 101.1* 102.7*   PLT 169 161 166  Recent Labs     06/17/22  0309 06/18/22  0419 06/19/22  0436   NA 135* 134* 136   K 3.7 3.1* 3.7   CL 103 103 105   CO2 26 26 26    PHOS 3.1 2.2* 2.9   BUN 34* 29* 34*   CREATININE 1.88* 1.64* 2.26*       Lab Results   Component Value Date    ALT 45 06/19/2022    AST 84 (H) 06/19/2022    ALKPHOS 293 (H) 06/19/2022    BILITOT 2.5 (H) 06/19/2022     No results found for: TSH, TSHREFLEX, TSHFT4, TSHELE, TSH3GEN, TSHHS   Lab Results   Component Value Date    LABA1C 7.7 (H) 06/02/2022     Factors impacting BG management  Factor Dose Comments   Nutrition:  TF   @target  rate of 30cc/hr  (115 CHO/D)   Running goal rate   Drug PRBC 06/18/22    Pain Fent PRN    Infection Cefepime Q24 hrs  Flagyl Q12 hrs  Diflucan Q24 hrs WBC unchanged   Other:   Kidney function  Liver function   AKI  Liver enzymes elevated   HD today     Blood glucose pattern      Significant diabetes-related events  05/31/22 Admission BG 233  06/01/22 Dex 4mg  => BG 100s => HC started  06/02/22 Bgs into 200s  06/03/22 Bgs in 170-180s  06/04/22 Bgs rising abit. HC reduced frequency today. Trickle feeds to begin  06/05/22 Bgs rising into 200s  06/08/22 Bgs in target  06/09/22 Bgs rising with start of TF at higher rate  06/10/22 Bgs rose abit with TF @30cc /hr  06/11/22 Bgs 108, 113. No insulin given  06/12/22 No insulin  given. No nutrition. AKI  06/14/22 Acute decline in health status after dialysis => back to ICU  06/15/22 TF restarted  06/16/22 TF still at 10cc/hr  06/17/22 TF at goal rate. Bgs 190s  06/18/22 Tfs running at goal rate. FBG 183  06/19/22 FBG 170 but higher during the day    Assessment and Nursing Intervention   Nursing Diagnosis Risk for unstable blood glucose pattern   Nursing Intervention Domain 5250 Decision-making Support   Nursing Interventions Examined current inpatient diabetes/blood glucose control   Explored factors facilitating and impeding inpatient management  Explored corrective strategies with patient and responsible inpatient provider   Informed patient of rational for insulin strategy while hospitalized     Billing Code(s)   []  99233 IP subsequent hospital care - 50 minutes   [x]  99232 IP subsequent hospital care - 35 minutes   []  99231 IP subsequent hospital care - 25 minutes   []  99221 IP initial hospital care - 40 minutes     Before making these care recommendations, I personally reviewed the hospitalization record, including notes, laboratory & diagnostic data and current medications, and examined the patient at the bedside (circumstances permitting) before determining care. More than fifty (50) percent of the time was spent in patient counseling and/or care coordination.  Total minutes: 35    06/18/22, APRN - CNS  Clinical Nurse Specialist - Diabetes & endocrine disorders  [facility]  Access via Perfect Serve

## 2022-06-19 NOTE — Progress Notes (Incomplete)
1900: Bedside shift change report given to Nissa Stannard (Cabin crew) by Darl Pikes (offgoing nurse). Report included the following information Nurse Handoff Report, Index, Intake/Output, MAR, Recent Results, and Cardiac Rhythm NSR .     2000: Shift assessment complete. Patient is intubated; FiO2 50%. Patient follows commands and nods appropriately. Bilateral lung sounds are diminished. Patient denies any nausea, pain, or concerns at this time. See Flowsheets and MAR for more details.

## 2022-06-19 NOTE — Progress Notes (Addendum)
[facility]    ICU TEAM Progress Note    Name: Jaime [name]   DOB: 11-06-1945   MRN: [id number]   Date: 06/19/2022        Subjective:     Reason for ICU Admission: respiratory failure     HPI: 76 y/o male ([facility] [place] with PMHx significant of HTN, HLD and T2DM. Came in ED 7/30 noon for N/V/Abd pain and generalized weakess. Admitted for intra abdominal septic shock.  S/p ex lap that showed fluid collection in right upper quadrant.  Pt in the ICU 7/31-8/11.  Started on broad spectrum antibiotics.  BC grew enterobacter and e coli.  Being followed by ID. Hosp c/c/b episodes of V-tach and worsening AKI requiring CRRT.  Improved and downgraded to step down unit on 8/11.  Early in the morning on 8/13 found to be acutely hypotensive and unresponsive.    Patient was transferred to ICU and intubated on 08/13.    8/14: pressor requirement is slowly declining. Remains intubated and sedated. Now on fio2 40%    8/15: started on CVVHD yesterday, pressors being tapered    08/16 - continues to be on mechanical ventilator.     08/17 - Follows command off sedation. On SBT but very debilitated.     08/18 - Follows command off sedation. On SBT but very debilitated.      Past Medical History:      has no past medical history on file.    Past Surgical History:      has a past surgical history that includes laparotomy (N/A, 06/01/2022); laparoscopy (N/A, 06/01/2022); Bladder surgery (N/A, 06/01/2022); and CT DRAINAGE VISCERAL PERCUTANEOUS (06/05/2022).    Home Medications:     Prior to Admission medications    Medication Sig Start Date End Date Taking? Authorizing Provider   metFORMIN (GLUCOPHAGE) 500 MG tablet Take 1 tablet by mouth daily  Patient not taking: Reported on 06/08/2022   Yes Historical Provider, MD   metoprolol (LOPRESSOR) 100 MG tablet Take 1 tablet by mouth daily   Yes Historical Provider, MD   hydrALAZINE (APRESOLINE) 25 MG tablet Take 1 tablet by mouth 3 times daily  Patient not taking: Reported on 06/08/2022    Yes Historical Provider, MD   atorvastatin (LIPITOR) 40 MG tablet Take 1 tablet by mouth daily  Patient not taking: Reported on 06/08/2022   Yes Historical Provider, MD   losartan (COZAAR) 100 MG tablet Take 1 tablet by mouth daily  Patient not taking: Reported on 06/08/2022   Yes Historical Provider, MD   metFORMIN (GLUCOPHAGE-XR) 500 MG extended release tablet Take 1 tablet by mouth daily (with breakfast)  Patient not taking: Reported on 06/08/2022 03/20/22   Historical Provider, MD   hydroCHLOROthiazide (HYDRODIURIL) 25 MG tablet TAKE 1 TABLET BY MOUTH ONCE DAILY AS NEEDED  Patient not taking: Reported on 06/08/2022 03/17/22   Historical Provider, MD   metoprolol succinate (TOPROL XL) 100 MG extended release tablet TAKE 1 TABLET BY MOUTH ONCE DAILY WITH OR IMMEDIATELY FOLLOWING A MEAL 03/10/22   Historical Provider, MD       Allergies/Social/Family History:     Allergies   Allergen Reactions    Augmentin [Amoxicillin-Pot Clavulanate] Hives     Tolerated ceftriaxone 06/2022    Codeine      Unknown reaction      Social History     Tobacco Use    Smoking status: Never    Smokeless tobacco: Never   Substance Use Topics  Alcohol use: Not on file      No family history on file.    Review of Systems:     Review of systems not obtained due to patient factors.    Objective:   Vital Signs:  BP (!) 130/52   Pulse 82   Temp 99.2 F (37.3 C) (Axillary)   Resp 23   Ht 1.727 m (5\' 8" )   Wt 99.7 kg (219 lb 12.8 oz)   SpO2 98%   BMI 33.42 kg/m      Temp (24hrs), Avg:99 F (37.2 C), Min:98.6 F (37 C), Max:99.5 F (37.5 C)           Intake/Output:     Intake/Output Summary (Last 24 hours) at 06/19/2022 1030  Last data filed at 06/19/2022 0900  Gross per 24 hour   Intake 2748.36 ml   Output 1843.5 ml   Net 904.86 ml         Physical Exam:  Intubated,sedated,   HEENT: dry mucus membranes  Heart: s1,s2  Lungs: poor air entry b/l  Abd: soft. Liver abscess drain + additional 2 drains in the RLQ.  Ext: gangrene of the toes bilaterally  noted.   Cns: Sedated but follows commands off sedation.     LABS AND  DATA: Personally reviewed  Recent Labs     06/18/22  1742 06/19/22  0436   WBC 16.4* 16.4*   HGB 8.3* 8.1*   HCT 27.0* 27.1*   PLT 161 166       Recent Labs     06/18/22  0419 06/19/22  0436   NA 134* 136   K 3.1* 3.7   CL 103 105   CO2 26 26   BUN 29* 34*   MG 2.0 2.2   PHOS 2.2* 2.9       Recent Labs     06/17/22  0309 06/19/22  0436   GLOB 3.7 4.2*       No results for input(s): INR, APTT in the last 72 hours.    Invalid input(s): PTP     Invalid input(s): PHI, PCO2I, PO2I, FIO2I  No results for input(s): CPK, CKMB in the last 72 hours.    Invalid input(s): TROIQ, BNPP    Hemodynamics:   PAP:   CO:     [place]: Discharge Planning  Living Arrangements: Spouse/Significant Other (06/05/22 1005)  Support Systems: Spouse/Significant Other;Family Members (06/05/22 1005)  Type of Residence: House (06/05/22 1005)  Potential Assistance Needed: N/A (06/05/22 1005)  Patient expects to be discharged to:: Unknown (06/05/22 1005) CI:         MEDS: Reviewed    Chest X-Ray: pul edema on 8/13      ECHO: 8/1 : normal EF    Multidisciplinary Rounds Completed:  Pending.    ABCDEF Bundle/Checklist Completed:  Yes      ICU Assessment/ Comprehensive Plan of Care:     Jaime [name] Is a 76 y.o. male with ESRD on HD who is admitted for liver abscess, now respiratory failure.    NEURO  Currently not on any sedation but has precedex for sedation and Fentanyl IV pushes for pain - to be used as needed.    CARDIAC  Taper off levophed as tolerated  8/1 : EF was normal  Had A.fib with RVR - 08/15, started on amiodarone gtt. Currently on PO amiodarone 08/17.    RESPIRATORY  Vent support  Pulmonary toilet  No significant secretions  SAT and SBT today -  He is currently on SBT and has good RSBI but he is very debilitated with weak cough, he was intubated 08/13.   I am hoping that in next few days he will be have better cough reflex (as sedation wears off) that we can extubate  him. I have talked to Dr. Rutherford Limerick who has graciously agreed to trach if we are unable to extubate by Monday, 08/21.    RENAL  On CVVD    Foley Catheter Present: No  IVFs: NS bolus 250 cc  x1    GASTROINTESTINAL  Liver drain  Tolerating trickle feeds so far  Liver drain +  CT abdomen repeated 0817 - shows peri-pancreatic edema.     ID  F/u cultures  Ct abdomen has revealed pancreatitis : continue meropenem for now  ID folowing the case    ENDOCRINE  SSI    MUSCULOSKELETAL  scd's    Mobility: Poor  PT/OT: PT consulted and on board and OT consulted and on board       DVT Prophylaxis: SCD's or Sequential Compression Device , enoxaparin  U - Ulcer Prophylaxis:   G - Glycemic Control: Insulin  B - Bowel Regimen:   Tubes: ETT  Lines: Central Line and [place]  Drains: Jackson-Pratt Drain (JP)    DISPOSITION/COMMUNICATION  Discussed Plan of Care/Code Status: Full Code  Stay in ICU    08/17  - I spoke to patients wife Mrs. Mccomber at [phone]. She lives in [place]. I updated her, I also discussed potential tracheostomy if he is not able to maintain his airways (due to secretions). She is hoping for a transfer to [facility] area once patient is stable enough.  - Cost of transfer is significant burden on the family, Case Management is trying to transfer him to [facility] hospital which might be less expensive.     CCM time - 40 minutes.    Catha Brow, MD  Intensivist  06/19/2022

## 2022-06-19 NOTE — Progress Notes (Signed)
Infectious Disease Progress    Impression    Sepsis  S/p septic shock  Remains on low dose pressors    Acute hypoxic respiratory failure  Re intubated 8/13  Initially intubated 7/31, s/p extubation 8/7  CXR+ for pulmonary edema.  Resp cultures + for light yeast  Apparent C.albicans/ dubliensis    E. coli bacteremia  Blood cultures 7/31+ for E. coli   2/2 LAC (pan sensitive)   Negative repeat cultures 8/4       Acute abdomen  Pneumoperitoneum  S/p diagnostic laparoscopy, laparotomy  EGD 7/31  Findings of cloudy fluid in right upper quadrant, yellow/white  Inflammatory peel over lesser curve of stomach, inferior lobe of liver  No gross perforation identified  Pancreas and retroperitoneum appeared edematous  Intra-Op fluid culture + for E. Coli (pansensitive)    Hepatic abscess  Gas and fluid containing collection 7.1 x 10.8 x 5.4 cm  In anterior left lobe of liver, patchy areas of hypodensity right lobe  4 x 4.5 x 5.5 cm collection  S/p drainage by IR 8/4.Cultures + for  few E.coli  Repeat CT 8/7 shows reduction in size of hepatic abscess  Plan was for ceftriaxone IV x6 weeks end date 9/15, Flagyl  Until 8/18  Repeat CT 8/13 + for    S/p percutaneous drainage of left hepatic abscess with  Minimal fluid remaining around catheter, suggestion of peripancreatic edema, small bilateral pleural effusions  Repeat CT abdomen pelvis 8/16+ for increased peripancreatic edema suspicious for pancreatitis, small residual fluid collection in left hepatic lobe  Lipase 737      Persistent leukocytosis  WBC 16.4  S/p abdominal wound dehiscence  Purulent material oozing from abdominal wound  Cultures taken, pending.    Gangrene, discoloration of toes  Persist, worse  Recommend Podiatry re evaluation    AKI  Cr 2.26  on CVVD    Coagulopathy  Improving    Thrombocytopenia  improved  platelets 235    Transaminitis  Improving    Hyperbilirubinemia   Bilirubin 2.7    Diabetes type 2  Hyperglycemia  A1c 7.7    Diarrhea  FMS  present    Obesity  BMI 34.76    Lines-drain -7/31, TLC-8/13, HD access-8/8,   E T-8/13, FMS 8/14    Plan  De-escalate therapy to Cefepime, Flagyl  Continue Fluconazole  Await wound cultures of purulent drainage  Vent management , fluids, pressors  per ICU team.      Please contact ID on call with questions, concerns over the weekend.        Extensive review of chart notes, labs, imaging, cultures done  Additionally review of done:  D/w ICU team, Dr. Salena Saner    Patient seen today.  Remains intubated.  Still on low dose pressor.  D/w ICU Team.    History reviewed. No pertinent past medical history.    Past Surgical History:   Procedure Laterality Date    BLADDER SURGERY N/A 06/01/2022    ENDOSCOPY OF ILEAL CONDUIT performed by Guinevere Ferrari, MD at [facility] MAIN OR    CT VISCERAL PERCUTANEOUS DRAIN  06/05/2022    CT VISCERAL PERCUTANEOUS DRAIN 06/05/2022 [facility] RAD CT    LAPAROSCOPY N/A 06/01/2022    LAPAROSCOPY DIAGNOSTIC performed by Guinevere Ferrari, MD at [facility]    LAPAROTOMY N/A 06/01/2022    LAPAROTOMY EXPLORATORY performed by Guinevere Ferrari, MD at [facility]       Allergies   Allergen Reactions  Augmentin [Amoxicillin-Pot Clavulanate] Hives     Tolerated ceftriaxone 06/2022    Codeine      Unknown reaction       Social Connections: Not on file       No family status information on file.           Review of Systems - Negative except those mentioned in H&P      PHYSICAL EXAM:  General:          Awake, in no distress  EENT:              EOMI. Anicteric sclerae. MMM  Resp:               CTA bilaterally, no wheezing or rales.  No accessory muscle use  CV:                  Regular  rhythm,  No edema  GI:                   Soft, Non distended, Non tender.  +Bowel sounds  Neurologic:      Alert and oriented X 3, normal speech,   Psych:             Good insight. Not anxious nor agitated  Skin:                No rashes.  No jaundice.  Extremities :  No edema, discoloration of  toes++.     Waymon Amato, MD Jerrel Ivory

## 2022-06-19 NOTE — Care Coordination-Inpatient (Signed)
Transition of Care Plan:     RUR: 18%  Prior Level of Functioning:  Independent - lives with wife in [place]  Disposition: TBD  If SNF or IPR: Date FOC offered:   Date FOC received:   Accepting facility:   Date authorization started with reference number:   Date authorization received and expires:   Follow up appointments: PCP/Specialist  DME needed: TBD  Transportation at discharge: family if appropriate  IM/IMM Medicare/Tricare letter given: will need 2nd IMM prior to discharge  Is patient a Veteran and connected with VA? yes              If yes, was [employer] transfer form completed and [facility] notified?   Caregiver Contact: Erika Hussar wife [phone]  Discharge Caregiver contacted prior to discharge? Per patient when able  Care Conference needed?   Barriers to discharge: medical stabiltiy, patient is intubated    CM sent updated clinicals - spoke with [facility] with [facility] [phone] - confirmed receipt of clinicals- they continue to follow for placement for patient - will need tx request form completed with wife verbally with 2 signatures prior to transfer to [facility] [place] area.  Patient not stable for transfer at this time. Will continue to follow. Ella Jubilee, MSW

## 2022-06-19 NOTE — Progress Notes (Addendum)
0720-Bedside shift change report given to Trula Ore, Charity fundraiser (Cabin crew) by Duwayne Heck, RN (offgoing nurse). Report included the following information SBAR, Kardex, ED Summary, OR Summary, Procedure Summary, Intake/Output, MAR, Recent Results, and Cardiac Rhythm -NSR .     0900-Shift assessment complete-see flowsheet for detailed findings. Pt. remains intubated without sedation-calm, cooperative, following commands, and nod/shaking his head appropriately.    1030-Interdisciplinary team rounds were held 06/19/2022 with the following team members:Physician, Nursing, Case Manager, Pharmacist, and the Dietician.    Plan of care discussed. See clinical pathway and/or care plan for interventions and desired outcomes.    Goals of the Day: Turn and reposition Q2 hours and begin Midodrine.    1200-Reassessment complete-no changes noted.    1620-Bedside shift change report given to Darl Pikes, Charity fundraiser (oncoming nurse) by Trula Ore, RN (offgoing nurse). Report included the following information SBAR, Kardex, ED Summary, OR Summary, Procedure Summary, Intake/Output, MAR, Recent Results, and Cardiac Rhythm -NSR .

## 2022-06-19 NOTE — Progress Notes (Addendum)
Palliative Medicine  Jaime [name] is a 76 y.o. with a past history of HTN, HLD, and DM2, who was admitted on 05/31/2022 while traveling in [place] for Nascar race, with a diagnosis of Perforated Viscus seen on CT, Septic Shock, and AKI.     Code Status: Full Code     Advance Care Planning:  Demographics 06/08/2022   Marital Status Married    No AMD on file. Spouse is legal nok.     Patient / Family Encounter Documentation     Participants (names): spouse Jaime [name], (by phone), Jaime [name], Jaime Leatherwood, LCSW     Narrative: LCSW checked in on patient, consulted with CM Jaime [name], and called Jaime [name] to give her an update and brainstorm resources and support for her and for patient as they try to coordinate getting her here to see him and meet with his doctors and ultimately to get him back to [name], [name] with her. She has many stressors and thought she may be able to drive up on 6/29 unless she can arrange something sooner. She said that patient's friend Jaime [name] had offered to help her come to visit.      Patient was alert and able to mouth words "Hope/Thank you" and indicate "yes/no" by moving his head.  He wanted to call Jaime [name], so this Clinical research associate got his # from spouse and called him on speaker phone with [employer]. He was directed to patient's spouse for updates on his condition and he shared with patient that many people are praying for him. He did not commit to bringing Jaime [name] to visit patient, but seemed to be considering how he could help.  Jaime [name] was planning to call Jaime [name] to coordinate.  This Clinical research associate put [employer] and [employer] #s on [employer]    Palliative SW provided spouse with some veterans resources in her community ([facility] [phone], [address] [name], [name], [id number], M-Th 8:30-4:30, F 8;30-12p) and encouraged her to contact them for support in her efforts to get patient moved back to [name]. She is looking forward to talking with Jaime [name] this weekend and hoping to have patient  transferred to [name] in [name], [place].    Psychosocial Issues Identified/ Resilience Factors:   Patient is a veteran of the [name]. He is retired now and has VA benefits, but a high co-pay for his medication due to his income.  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 His wife uses O2 and has a Designer, jewellery. She ambulates in a wheelchair. She is buying groceries for Jaime [name] and paying bills while patient is hospitalized.     Patient and spouse will be married 45 years on August 20. They usually go out to eat on their anniversary, but also they don't wait for special occasions to go out. His best friend is Financial planner. They travel to about 8 Nascar races a year, and Jaime [name] was with patient on his trip to [place] and urged him to go to the hospital, for which patient is grateful.     Caregiver Burden: High  Does the caregiver feel confident administering medication? Not discussed.  Does the caregiver need any help connecting with community resources? Yes  Does the caregiver feel confident assisting with activities of daily living? No.     Goals of Care / Plan:   Patient's symptoms will be managed.  Patient wants to transfer as soon as possible to a ([facility]) hospital in [place], [place].  Spouse now has his wedding ring,  which was tight on his finger and removed by ICU nurse.  Spouse is working on plans to come visit patient this weekend, if possible.  If Jaime [name] comes this weekend, there is a card from patient taped to whiteboard in his room for her.  Patient will be offered the opportunity to complete an AMD if he is interested and able during this hospitalization.  Palliative team will continue to provide education and support as appropriate to patient and family.     Thank you for including Palliative Medicine in the care of Mr. Jaime [name].     Jaime Leatherwood, LCSW  [phone])

## 2022-06-20 LAB — POCT GLUCOSE
POC Glucose: 199 mg/dL — ABNORMAL HIGH (ref 65–117)
POC Glucose: 185 mg/dL — ABNORMAL HIGH (ref 65–117)
POC Glucose: 166 mg/dL — ABNORMAL HIGH (ref 65–117)
POC Glucose: 225 mg/dL — ABNORMAL HIGH (ref 65–117)
POC Glucose: 158 mg/dL — ABNORMAL HIGH (ref 65–117)

## 2022-06-20 LAB — CBC
Hematocrit: 25.7 % — ABNORMAL LOW (ref 36.6–50.3)
Hemoglobin: 7.8 g/dL — ABNORMAL LOW (ref 12.1–17.0)
MCH: 31.5 PG (ref 26.0–34.0)
MCHC: 30.4 g/dL (ref 30.0–36.5)
MCV: 103.6 FL — ABNORMAL HIGH (ref 80.0–99.0)
MPV: 10.9 FL (ref 8.9–12.9)
Nucleated RBCs: 0.1 PER 100 WBC — ABNORMAL HIGH
Platelets: 152 10*3/uL (ref 150–400)
RBC: 2.48 M/uL — ABNORMAL LOW (ref 4.10–5.70)
RDW: 19.7 % — ABNORMAL HIGH (ref 11.5–14.5)
WBC: 15.7 10*3/uL — ABNORMAL HIGH (ref 4.1–11.1)
nRBC: 0.02 10*3/uL — ABNORMAL HIGH (ref 0.00–0.01)

## 2022-06-20 LAB — BASIC METABOLIC PANEL
Anion Gap: 6 mmol/L (ref 5–15)
BUN: 60 MG/DL — ABNORMAL HIGH (ref 6–20)
Bun/Cre Ratio: 18 (ref 12–20)
CO2: 24 mmol/L (ref 21–32)
Calcium: 8 MG/DL — ABNORMAL LOW (ref 8.5–10.1)
Chloride: 106 mmol/L (ref 97–108)
Creatinine: 3.3 MG/DL — ABNORMAL HIGH (ref 0.70–1.30)
Est, Glom Filt Rate: 19 mL/min/{1.73_m2} — ABNORMAL LOW (ref >60)
Glucose: 192 mg/dL — ABNORMAL HIGH (ref 65–100)
Potassium: 3.9 mmol/L (ref 3.5–5.1)
Sodium: 136 mmol/L (ref 136–145)

## 2022-06-20 LAB — 1,3 BETA-D-GLUCAN: Fungitell: <31 pg/mL (ref <80)

## 2022-06-20 LAB — PHOSPHORUS: Phosphorus: 3.6 MG/DL (ref 2.6–4.7)

## 2022-06-20 LAB — CULTURE, BLOOD 1: Culture: NO GROWTH

## 2022-06-20 LAB — MAGNESIUM: Magnesium: 2.3 mg/dL (ref 1.6–2.4)

## 2022-06-20 MED ORDER — AMIODARONE HCL 200 MG PO TABS
200 | Freq: Every day | ORAL | Status: DC
Start: 2022-06-20 — End: 2022-06-21
  Administered 2022-06-21: 14:00:00 200 mg via ORAL

## 2022-06-20 MED ORDER — OXYCODONE HCL 5 MG PO TABS
5 | ORAL | Status: DC | PRN
Start: 2022-06-20 — End: 2022-07-17
  Administered 2022-06-20 – 2022-07-17 (×6): 5 mg via ORAL

## 2022-06-20 MED FILL — PRECEDEX 200 MCG/2ML IV SOLN: 200 MCG/2ML | INTRAVENOUS | Qty: 4

## 2022-06-20 MED FILL — HEPARIN SODIUM (PORCINE) 5000 UNIT/ML IJ SOLN: 5000 UNIT/ML | INTRAMUSCULAR | Qty: 1

## 2022-06-20 MED FILL — OXYCODONE HCL 5 MG PO TABS: 5 MG | ORAL | Qty: 1

## 2022-06-20 MED FILL — HUMALOG 100 UNIT/ML IJ SOLN: 100 UNIT/ML | INTRAMUSCULAR | Qty: 1

## 2022-06-20 MED FILL — METRONIDAZOLE 500 MG/100ML IV SOLN: 500 MG/100ML | INTRAVENOUS | Qty: 100

## 2022-06-20 MED FILL — RISAQUAD-2 PO CAPS: ORAL | Qty: 1

## 2022-06-20 MED FILL — ALBUTEIN 25 % IV SOLN: 25 % | INTRAVENOUS | Qty: 100

## 2022-06-20 MED FILL — FLUCONAZOLE IN SODIUM CHLORIDE 200-0.9 MG/100ML-% IV SOLN: INTRAVENOUS | Qty: 100

## 2022-06-20 MED FILL — MIDODRINE HCL 5 MG PO TABS: 5 MG | ORAL | Qty: 1

## 2022-06-20 MED FILL — AMIODARONE HCL 200 MG PO TABS: 200 MG | ORAL | Qty: 2

## 2022-06-20 MED FILL — CEFEPIME HCL 1 G IJ SOLR: 1 g | INTRAMUSCULAR | Qty: 1000

## 2022-06-20 MED FILL — LANSOPRAZOLE 30 MG PO TBDD: 30 MG | ORAL | Qty: 1

## 2022-06-20 MED FILL — LANTUS 100 UNIT/ML SC SOLN: 100 UNIT/ML | SUBCUTANEOUS | Qty: 1

## 2022-06-20 NOTE — Plan of Care (Signed)
Problem: Physical Therapy - Adult  Goal: By Discharge: Performs mobility at highest level of function for planned discharge setting.  See evaluation for individualized goals.  Description: FUNCTIONAL STATUS PRIOR TO ADMISSION: Patient was independent and active without use of DME. Drove himself to [place] from [place] for NASCAR race. Denies home O2 use.     HOME SUPPORT PRIOR TO ADMISSION: The patient lived with wife however wife has her own medical issues and cannot provide physical assist.    Physical Therapy Goals  Reassessed 06/20/2022   1.  Patient will move from supine to sit and sit to supine, scoot up and down, and roll side to side in bed with maximal assist of 1 within 7 day(s).   2. Patient will sit EOB x 5 minutes with contact guard assist within 7 days.    3.  Patient will perform sit to stand with maximal assistance x2 within 7 day(s).  4.  Patient will transfer from bed to chair and chair to bed via minimal lift equipment (best mover) within 7 day(s).    Initiated 06/10/2022  1.  Patient will move from supine to sit and sit to supine, scoot up and down, and roll side to side in bed with contact guard assist within 7 day(s).   2. Patient will sit EOB x 10 minutes with supervision/set-up assist within 7 days.    2.  Patient will perform sit to stand with moderate assistance x2 within 7 day(s).  3.  Patient will transfer from bed to chair and chair to bed with moderate assistance x2 using the least restrictive device within 7 day(s).  4.  Patient will ambulate with moderate assistance x2 for 5 feet with the least restrictive device within 7 day(s).   Outcome: Progressing     PHYSICAL THERAPY RE-EVALUATION    Patient: Jaime [name] (76 y.o. male)  Date: 06/20/2022  Primary Diagnosis: Septicemia (HCC) [A41.9]  Gastric perforation (HCC) [K25.5]  Perforated abdominal viscus [R19.8]  Procedure(s) (LRB):  LAPAROTOMY EXPLORATORY (N/A)  LAPAROSCOPY DIAGNOSTIC (N/A)  ENDOSCOPY OF ILEAL CONDUIT (N/A) 19  Days Post-Op   Precautions: Fall Risk                  ASSESSMENT :  DEFICITS/IMPAIRMENTS:   The patient is limited by decreased functional mobility, independence in ADLs, high-level IADLs, ROM, strength, sensation, activity tolerance, endurance, cognition, attention/concentration, balance, vision/visual deficit, posture, fine-motor control, increased pain levels. Patient intubated on 8/13 and extubated this morning (8/19) to nasal cannula. Patient fatigues quickly with activity at bed level and in chair position. Encouraged patient to be upright in chair position but to change positions frequently with nursing staff. Vitals stable in bed in chair position. Hopeful to be able to progress to edge of bed next session.    Based on the impairments listed above recommend ongoing inpatient rehab - hopeful that patient can progress activity tolerance to be able to go to IPR given high prior level of function and strong motivation to participate and progress mobility.    Patient will benefit from skilled intervention to address the above impairments.    Functional Outcome Measure:  The patient scored 6/24 on the AMPAC outcome measure which is indicative of increased likelihood of need for ongoing therapy upon dc.     PLAN :  Recommendations and Planned Interventions:   bed mobility training, transfer training, gait training, therapeutic exercises, neuromuscular re-education, edema management/control, patient and family training/education, and therapeutic activities    Frequency/Duration:  Patient will be followed by physical therapy to address goals, PT Plan of Care: 5 times/week to address goals.    Recommendation for discharge: (in order for the patient to meet his/her long term goals): Working towards tolerating Therapy 3 hours/day 5-7 days/week    Other factors to consider for discharge: patient's current support system is unable to meet their requirements for physical assistance - wife unable to provide much physical  assist, complicated hospitalization and extubated AM of PT eval, from [place]    IF patient discharges home will need the following DME: continuing to assess with progress     SUBJECTIVE:   Patient stated "I am exhausted."    OBJECTIVE DATA SUMMARY:   Hospital course since last seen and reason for re-evaluation: 8/13 acutely hypotensive and unresponsive. Intubated 8/13. Extubated 8/19 AM.    History reviewed. No pertinent past medical history.  Past Surgical History:   Procedure Laterality Date    BLADDER SURGERY N/A 06/01/2022    ENDOSCOPY OF ILEAL CONDUIT performed by Guinevere Ferrari, MD at [facility] MAIN OR    CT VISCERAL PERCUTANEOUS DRAIN  06/05/2022    CT VISCERAL PERCUTANEOUS DRAIN 06/05/2022 [facility] RAD CT    LAPAROSCOPY N/A 06/01/2022    LAPAROSCOPY DIAGNOSTIC performed by Guinevere Ferrari, MD at [facility]    LAPAROTOMY N/A 06/01/2022    LAPAROTOMY EXPLORATORY performed by Guinevere Ferrari, MD at [facility]     Home Situation:  Social/Functional History  Lives With: Spouse  Type of Home: House  Home Layout: One level  Home Equipment: None  ADL Assistance: Independent  Ambulation Assistance: Independent  Transfer Assistance: Independent  Active Driver: Yes  Critical Behavior:  Orientation  Overall Orientation Status: Impaired  Orientation Level: Oriented to situation;Oriented to person;Disoriented to time;Oriented to place  Cognition  Overall Cognitive Status: Exceptions  Arousal/Alertness: Appropriate responses to stimuli  Following Commands: Follows one step commands with increased time;Follows multistep commands with increased time  Attention Span: Attends with cues to redirect  Memory:  (able to recall date with delay following re-orientation)  Insights: Decreased awareness of deficits  Initiation: Requires cues for some    Hearing:   Hearing  Hearing: Within functional limits    Vision/Perceptual:          Vision  Vision: Impaired (note nystagmus? - reports baseline)  Vision Exceptions: Wears glasses at all times (none present,  unable to read board)       Strength:    Strength: Grossly decreased, non-functional    Tone & Sensation:   Tone: Normal  Sensation: Impaired (bilateral toes)    Coordination:  Coordination: Generally decreased, functional    Range Of Motion:  AROM: Grossly decreased, non-functional  PROM: Generally decreased, functional    Functional Mobility:  Bed Mobility:     Bed Mobility Training  Bed Mobility Training: Yes  Rolling: Maximum assistance;Assist X2 (repositioning in bed)  Supine to Sit: Total assistance (bed in chair position)  Scooting: Maximum assistance;Assist X2 (scoot up in bed)  Balance:               Balance  Sitting:  (not formally assessed)  [employer] AM-PAC      Basic Mobility Inpatient Short Form (6-Clicks) Version 2    How much help is needed turning from your back to your side while in a flat bed without using bedrails?: Total  How much help is needed moving from lying on your back to sitting on the side of a flat bed without using bedrails?: Total  How much help is needed moving to and from a bed to a chair?: Total  How much help is needed standing up from a chair using your arms?: Total  How much help is needed walking in hospital room?: Total  How much help is needed climbing 3-5 steps with a railing?: Total    AM-PAC Inpatient Mobility Raw Score : 6  AM-PAC Inpatient T-Scale Score : 23.55     Cutoff score ?171,2,3 had higher odds of discharging home with home health or need of SNF/IPR.    1. Emelia Loron, Janeece Riggers, Vinoth Fransico Meadow, Lupe Carney Passek, Thornton Dales. Cassandria Anger.  Validity of the AM-PAC "6-Clicks" Inpatient Daily Activity and Basic Mobility Short Forms. Physical Therapy Mar 2014, [phone] (3) 379-391; DOI: 10.2522/ptj.20130199  2. Venetia Night. Association of AM-PAC "6-Clicks" Basic Mobility and Daily Activity Scores With Discharge Destination. Phys Ther. 2021 Apr 4;101(4):pzab043. doi: 10.1093/ptj/pzab043. PMID: 70623762.  3. Herbold J, Rajaraman D, Lubertha Basque, Agayby K, [place] S. Activity Measure for Post-Acute Care "6-Clicks" Basic Mobility Scores Predict Discharge Destination After Acute Care Hospitalization in Select Patient Groups: A Retrospective, Observational Study. Arch Rehabil Res Clin Transl. 2022 Jul 16;4(3):100204. doi: 10.1016/j.arrct.8315.176160. PMID: 73710626; PMCID: [id number].  4. Josefina Do, Coster W, Ni P. AM-PAC Short Forms Manual 4.0. Revised 12/2018.                                                                                                                                                                                                                               Pain Rating:  Did not significantly impact ability to participate    Pain Intervention(s):   repositioning and pain is at a level acceptable to the patient    Activity Tolerance:   Fair     After treatment:   Patient left in no apparent distress sitting up in chair position in bed, Call bell within reach, Bed/ chair alarm activated, and Side rails x3    COMMUNICATION/EDUCATION:   The patient's  plan of care was discussed with: occupational therapist, speech therapist, and registered nurse    Patient Education  Education Given To: Patient  Education Provided: Role of Therapy;Home Exercise Program  Education Provided Comments: quad sets, long arc quads (when in chair position), heel slides, and ankle pumps; bed in chair position and importance of frequent repositioning for pressure relief  Education Method: Verbal  Barriers to Learning: None  Education Outcome: Verbalized understanding;Continued education needed    Thank you for this referral.  Audrie Lia, PT  Minutes: 24

## 2022-06-20 NOTE — Progress Notes (Signed)
Orders received, chart reviewed and patient evaluated by occupational therapy. Pending progression with skilled acute occupational therapy, recommend:  Continue to assess pending progress    Recommend with nursing patient to complete as able in order to maintain strength, endurance and independence: bed to chair position 3x/day, ADLs with supervision/setup and mobilizing in bed for toileting with 2 assist. Thank you for your assistance.     Full evaluation to follow.

## 2022-06-20 NOTE — Plan of Care (Signed)
Problem: Occupational Therapy - Adult  Goal: By Discharge: Performs self-care activities at highest level of function for planned discharge setting.  See evaluation for individualized goals.  Description: FUNCTIONAL STATUS PRIOR TO ADMISSION:    , ADL Assistance: Independent,  ,  ,  ,  ,  ,  , Ambulation Assistance: Independent, Transfer Assistance: Independent, Active Driver: Yes     HOME SUPPORT: Patient lived with spouse and was independent.    Occupational Therapy Goals:  Initiated 06/10/2022  Re-evaluation 06/20/2022 - continue all goals  1.  Patient will perform grooming at bed level with Moderate Assist within 7 day(s).  2.  Patient will perform self-feeding with Moderate Assist within 7 day(s).  3.  Patient will perform upper body dressing with Maximal Assist within 7 day(s).  4.  Patient will complete supine>sit with mod assist x2  within 7 day(s).  5.  Patient will tolerate sitting EOB with fair sitting balance in preparation for ADLs within 7 day(s).      Outcome: Progressing    OCCUPATIONAL THERAPY RE-EVALUATION  Patient: Jaime [name] (76 y.o. male)  Date: 06/20/2022  Primary Diagnosis: Septicemia (HCC) [A41.9]  Gastric perforation (HCC) [K25.5]  Perforated abdominal viscus [R19.8]  Procedure(s) (LRB):  LAPAROTOMY EXPLORATORY (N/A)  LAPAROSCOPY DIAGNOSTIC (N/A)  ENDOSCOPY OF ILEAL CONDUIT (N/A) 19 Days Post-Op     Precautions: Fall Risk                Chart, occupational therapy assessment, plan of care, and goals were reviewed.    ASSESSMENT  The patient is limited by decreased functional mobility, independence in ADLs, high-level IADLs, ROM, strength, sensation, body mechanics, activity tolerance, endurance, cognition, attention/concentration, coordination, balance, posture, fine-motor control.  Patient intubated on 8/13 and extubated this morning (8/19) to nasal cannula. Patient fatigues quickly with activity at bed level and in chair position. Patient participated in [place] in BUE and encouraged to  move BUE/hands as much as possible to facilitate reduction of edema in BUE. Encouraged patient to be upright in chair position but to change positions frequently with nursing staff. Vitals stable in bed in chair position. Hopeful to be able to progress to edge of bed next session in prep for ADLs. Patient would benefit from continued acute rehab and will require rehab once cleared for D/C - will continue to assess as he is working toward tolerating 3 hrs/day of therapy.       Functional Outcome Measure:  The patient scored 0/100 on the Barthel Index outcome measure which is indicative of 100% impairment in self care tasks.           PLAN :  Recommendations and Planned Interventions:   self care training, therapeutic activities, functional mobility training, balance training, therapeutic exercise, endurance activities, patient education, home safety training, and family training/education    Frequency/Duration: OT Plan of Care: 5 times/week    Recommend with staff: Recommend with nursing, ADLs with supervision/setup, bed to chair position 3x/day and toileting via bedpan. Thank you for completing as able in order to maintain patient strength, endurance and independence.     Recommend next OT session: sit EOB, attempt simple ADLs    Recommendation for discharge: (in order for the patient to meet his/her long term goals): Continue to assess pending progress    Other factors to consider for discharge: patient's current support system is unable to meet their requirements for physical assistance and high risk for falls    IF patient discharges home will need the  following DME: hospital bed and mechanical lift       SUBJECTIVE:   Patient stated "I want to work."    OBJECTIVE DATA SUMMARY:   Hospital course since last seen and reason for reevaluation: s/p intubation for 6 days, now extubated    Cognitive/Behavioral Status:    Orientation  Overall Orientation Status: Impaired  Orientation Level: Oriented to situation;Oriented  to person;Disoriented to time;Oriented to place  Cognition  Overall Cognitive Status: Exceptions  Arousal/Alertness: Appropriate responses to stimuli  Following Commands: Follows one step commands with increased time;Follows multistep commands with increased time  Attention Span: Attends with cues to redirect  Memory:  (able to recall date with delay following re-orientation)  Insights: Decreased awareness of deficits  Initiation: Requires cues for some    Skin: necrotic toes, scattered bruising    Edema: 2+ pitting in BUE, particularly dorsum of B hands.     Hearing:   Hearing  Hearing: Within functional limits    Vision/Perceptual:          Vision  Vision: Impaired (note nystagmus? - reports baseline)  Vision Exceptions: Wears glasses at all times (none present, unable to read board)       Range of Motion:   AROM: Grossly decreased, non-functional  PROM: Grossly decreased, non-functional      Strength:  Strength: Grossly decreased, non-functional      Coordination:  Coordination: Grossly decreased, non-functional     Coordination: Generally decreased, functional      Tone & Sensation:   Tone: Normal  Sensation: Intact        Functional Mobility and Transfers for ADLs:  Bed Mobility:     Bed Mobility Training  Bed Mobility Training: Yes  Rolling: Maximum assistance;Assist X2 (repositioning in bed)  Supine to Sit: Total assistance (bed in chair position)  Scooting: Maximum assistance;Assist X2 (scoot up in bed)    Transfers:  NT this session    Balance:     Balance  Sitting:  (not formally assessed)    ADL Assessment:     Feeding: NPO     Grooming: Dependent/Total     UE Bathing: Dependent/Total     LE Bathing: Dependent/Total     UE Dressing: Dependent/Total     LE Dressing: Dependent/Total     Toileting: Dependent/Total    ADL Intervention and task modifications:    Patient max-total A for ADLs and mobility, grossly debilitated s/p extubation  Barthel Index:    Barthel Index Scale  Feeding: Cannot perform activity  Bathing: Cannot perform activity  Grooming: Cannot perform activity  Dressing: Cannot perform activity  Bowel Control: Cannot perform activity  Bladder Control: Cannot perform activity  Toilet Transfers: Cannot perform activity  Chair/Bed Trannsfers: Cannot perform activity  Ambulation: Cannot perform activity  Stairs: Cannot perform activity  Total Barthel Index Score: 0       The Barthel ADL Index: Guidelines  1. The index should be used as a record of what a patient does, not as a record of what a patient could do.  2. The main aim is to establish degree of independence from any help, physical or verbal, however minor and for whatever reason.  3. The need for supervision renders the patient not independent.  4. A patient's performance should be established using the best available evidence. Asking the patient, friends/relatives and nurses are the usual sources, but direct observation and common sense are also important. However direct testing is not needed.  5. Usually the patient's performance over the preceding 24-48 hours is important, but occasionally longer periods will be relevant.  6. Middle categories imply that the patient supplies over 50 per cent of the effort.  7. Use of aids to be independent is allowed.    Score Interpretation (from Sinoff 1997)   80-100 Independent   60-79 Minimally independent   40-59 Partially dependent   20-39 Very dependent   <20 Totally dependent     -Mahoney, F.l., Barthel, D.W. (1965). Functional evaluation: the Barthel Index. [place] [address] Med J (14)2.  -Sinoff, G., Ore, L. (1997). The Barthel activities of daily living index: self-reporting versus actual performance in the old (> or = 75 years). Journal of American Geriatric Society 45(7), [phone].    -Kenn File Ridgetop, J.J.M.F, Noel Christmas., Imagene Gurney. (1999). Measuring the change in disability after inpatient rehabilitation; comparison of the responsiveness of the Barthel Index and Functional Independence Measure. Journal of Neurology, Neurosurgery, and Psychiatry, 66(4), [phone].  Dawson Bills, N.J.A, [name],  W.J.M, & Koopmanschap, M.A. (2004) Assessment of post-stroke quality of life in cost-effectiveness studies: The usefulness of the Barthel Index and the EuroQoL-5D. Quality of Life Research, 13, [phone]  Pain Rating:  Did not quantify     Pain Intervention(s):   repositioning    Activity Tolerance:   Poor  Please refer to the flowsheet for vital signs taken during this treatment.    After treatment:   Patient left in no apparent distress in bed, Call bell within reach, and Side rails x3    COMMUNICATION/EDUCATION:   The patient's plan of care was discussed with: physical therapist, speech therapist, and registered nurse    Patient Education  Education Given To: Patient  Education Provided: Role of Therapy;Plan of Care  Education Method: Verbal  Barriers to Learning: None  Education Outcome: Verbalized understanding;Continued education needed    Thank you for this referral.  Evalina Field, OT  Minutes: 38

## 2022-06-20 NOTE — Progress Notes (Addendum)
1914  Bedside and Verbal shift change report given to Harriett Sine, Charity fundraiser (Cabin crew) by Huntley Dec, RN (offgoing nurse). Report included the following information Intake/Output, MAR, Recent Results, and Cardiac Rhythm NSR .     0000  Patient tolerated dialysis well.  Talked with him about doing active ROM with hands/arms/legs.  Patient is very alert and motivated to get better.  Denies pain at this time.    0430  Labs sent.  No change in assessment.    0745  Bedside and Verbal shift change report given to Misty Stanley, RN and Ladona Ridgel, RN (Cabin crew) by Harriett Sine, RN (offgoing nurse). Report included the following information Intake/Output, Recent Results, and Cardiac Rhythm a-fib. Marland Kitchen

## 2022-06-20 NOTE — Progress Notes (Signed)
Name: Jaime [name]   MRN: [id number]  DOB: November 02, 1946  06/20/2022 8:37 AM      Admit Date: 05/31/2022    Admit Diagnosis: Septicemia (HCC) [A41.9]  Gastric perforation (HCC) [K25.5]  Perforated abdominal viscus [R19.8]    Assessment/Plan:             06/20/22     Assessment:         AKI/ATN- anuric- Grade 3-  Resp failure Vent Dependent-FiO2 50%  Septic shock-still on low-dose pressor  Pancreatitis   Hyponatremia-sodium to 136  Hypoalbuminemia with albumin of 1.7 due to chronic inflammation  Lipase elevated at 737 suggestive of pancreatitis, along with CT scan  Encephalopathy  Acute abdomen from abdominal viscus perforation    Liver abscess  S/p emergent exploratory laparotomy 06/01/22    Type 2 DM  Shock liver  Acute on chronic anemia              Discussion:        CRRT on hold  HD today   Right IJ quinton 8/8   Severe PAD with gangrene of toes  [facility] made aware     Ancil Linsey, MD    Dr Dorthey Sawyer  Cell no- [phone]  Available on perfect serve.      Signed By: Ancil Linsey, MD     June 20, 2022         Subjective:   On vent    Review of Systems:  Review of systems not obtained due to patient factors.   Objective:    BP (!) 135/56   Pulse 79   Temp 98.2 F (36.8 C) (Axillary)   Resp 24   Ht 1.727 m (5\' 8" )   Wt 99.7 kg (219 lb 12.8 oz)   SpO2 96%   BMI 33.42 kg/m     Physical Exam:     Access- R IJ Dialysis access  L ij tLC   ++ edema    Recent Results (from the past 24 hour(s))   POCT Glucose    Collection Time: 06/19/22  1:28 PM   Result Value Ref Range    POC Glucose 208 (H) 65 - 117 mg/dL    Performed by: 06/21/22 RN    POCT Glucose    Collection Time: 06/19/22  6:32 PM   Result Value Ref Range    POC Glucose 215 (H) 65 - 117 mg/dL    Performed by: 06/21/22 RN    POCT Glucose    Collection Time: 06/19/22  8:50 PM   Result Value Ref Range    POC Glucose 199 (H) 65 - 117 mg/dL    Performed by:  06/21/22 RN    POCT Glucose    Collection Time: 06/20/22 12:08 AM   Result Value Ref Range    POC Glucose 185 (H) 65 - 117 mg/dL    Performed by: 06/22/22 RN    Basic Metabolic Panel    Collection Time: 06/20/22  2:41 AM   Result Value Ref Range    Sodium 136 136 - 145 mmol/L    Potassium 3.9 3.5 - 5.1 mmol/L    Chloride 106 97 - 108 mmol/L    CO2 24 21 - 32 mmol/L    Anion Gap 6 5 - 15 mmol/L    Glucose 192 (H) 65 - 100 mg/dL    BUN 60 (H) 6 - 20 MG/DL    Creatinine 06/22/22 (H) 0.70 - 1.30 MG/DL  Bun/Cre Ratio 18 12 - 20      Est, Glom Filt Rate 19 (L) >60 ml/min/1.98m2    Calcium 8.0 (L) 8.5 - 10.1 MG/DL   Magnesium    Collection Time: 06/20/22  2:41 AM   Result Value Ref Range    Magnesium 2.3 1.6 - 2.4 mg/dL   Phosphorus    Collection Time: 06/20/22  2:41 AM   Result Value Ref Range    Phosphorus 3.6 2.6 - 4.7 MG/DL   CBC    Collection Time: 06/20/22  2:41 AM   Result Value Ref Range    WBC 15.7 (H) 4.1 - 11.1 K/uL    RBC 2.48 (L) 4.10 - 5.70 M/uL    Hemoglobin 7.8 (L) 12.1 - 17.0 g/dL    Hematocrit 60.1 (L) 36.6 - 50.3 %    MCV 103.6 (H) 80.0 - 99.0 FL    MCH 31.5 26.0 - 34.0 PG    MCHC 30.4 30.0 - 36.5 g/dL    RDW 09.3 (H) 23.5 - 14.5 %    Platelets 152 150 - 400 K/uL    MPV 10.9 8.9 - 12.9 FL    Nucleated RBCs 0.1 (H) 0 PER 100 WBC    nRBC 0.02 (H) 0.00 - 0.01 K/uL   POCT Glucose    Collection Time: 06/20/22  6:10 AM   Result Value Ref Range    POC Glucose 166 (H) 65 - 117 mg/dL    Performed by: Meyer Cory RN           Intake/Output Summary (Last 24 hours) at 06/20/2022 0837  Last data filed at 06/20/2022 0700  Gross per 24 hour   Intake 1541.37 ml   Output 180 ml   Net 1361.37 ml            Data Review:   Recent Labs     06/20/22  0241   NA 136   K 3.9   BUN 60*   CREATININE 3.30*   WBC 15.7*   HGB 7.8*   HCT 25.7*   PLT 152         No current facility-administered medications on file prior to encounter.     Current Outpatient Medications on File Prior to Encounter   Medication Sig  Dispense Refill    metFORMIN (GLUCOPHAGE) 500 MG tablet Take 1 tablet by mouth daily (Patient not taking: Reported on 06/08/2022)      metoprolol (LOPRESSOR) 100 MG tablet Take 1 tablet by mouth daily      hydrALAZINE (APRESOLINE) 25 MG tablet Take 1 tablet by mouth 3 times daily (Patient not taking: Reported on 06/08/2022)      atorvastatin (LIPITOR) 40 MG tablet Take 1 tablet by mouth daily (Patient not taking: Reported on 06/08/2022)      losartan (COZAAR) 100 MG tablet Take 1 tablet by mouth daily (Patient not taking: Reported on 06/08/2022)      metFORMIN (GLUCOPHAGE-XR) 500 MG extended release tablet Take 1 tablet by mouth daily (with breakfast) (Patient not taking: Reported on 06/08/2022)      hydroCHLOROthiazide (HYDRODIURIL) 25 MG tablet TAKE 1 TABLET BY MOUTH ONCE DAILY AS NEEDED (Patient not taking: Reported on 06/08/2022)      metoprolol succinate (TOPROL XL) 100 MG extended release tablet TAKE 1 TABLET BY MOUTH ONCE DAILY WITH OR IMMEDIATELY FOLLOWING A MEAL          Care Plan discussed with:  Patient     Family  RN x    Care Manager        Hospitalist    Consultant:            Comments   >50% of visit spent in counseling and coordination of care Y          Signed By: Ancil Linsey, MD     June 20, 2022       This dictation was done by dragon, computer voice recognition software.  Often unanticipated grammatical, syntax, phones and other interpretive errors are inadvertently transcribed.  Please excuse errors that have escaped final proofreading. Please contact me if you suspect dictation or transcription errors.      Dr Dorthey Sawyer    Office No- [phone]  [facility]  [address]  [address]  [place], [place] [address]    Fax: [phone]

## 2022-06-20 NOTE — Progress Notes (Signed)
1025- Bedside, Verbal, and Written shift change report given to Huntley Dec RN & Jill Side RN (oncoming nurse) by Verner Mould (offgoing nurse). Report included the following information SBAR, Kardex, ED Summary, OR Summary, Intake/Output, MAR, and Cardiac Rhythm NSR .      0800- shift assessment completed, see flowsheets.    8527- Pt extubated by RT at this time.    1200- Reassessment performed, see flowsheets.    1600- Reassessment performed, see flowsheets.    Gus Puma, RN

## 2022-06-20 NOTE — Other (Signed)
Primary RN SBAR: Jaime Montane, RN  Patient Education: procedural / infection control  Hepatitis B Surface Ag   Date/Time Value Ref Range Status   06/01/2022 10:08 PM <0.10 Index Final     Hep B S Ab   Date/Time Value Ref Range Status   06/01/2022 10:08 PM <3.10 mIU/mL Final        06/20/22 1715   Vital Signs   BP 126/69   Temp 97.6 F (36.4 C)   Pulse 83   Respirations 20   SpO2 98 %   Pain Assessment   Pain Assessment None - Denies Pain   Treatment   Time On 1715   Treatment Goal UF 1.5 kg   Observations & Evaluations   Level of Consciousness 0   Oriented X 4   Heart Rhythm   (ICU monitoring)   Respiratory Quality/Effort Unlabored   O2 Device Nasal cannula   Skin Color Pale   Skin Condition/Temp Cool;Dry   Edema Generalized   RUE Edema +2;Pitting   LUE Edema +2;Pitting   RLE Edema +2;Pitting   LLE Edema +2;Pitting   Technical Checks   Dialysis Machine No. [id number]   RO Machine Number [id number]   Dialyzer Lot No. [id number]   Tubing Lot Number [id number]   All Connections Secure Yes   NS Bag Yes   Saline Line Double Clamped Yes   Dialyzer Revaclear 300   Prime Volume (mL) 200 mL   ICEBOAT I;C;E;B;O;A;T   RO Machine Log Sheet Completed Yes   Machine Alarm Self Test Completed;Passed   Child psychotherapist Function   Extracorporeal Chemical engineer Conductivity 13.9   Manual Conductivity 13.8   Manual Ph 7.2   Bleach Test (Neg) Yes   Bath Temperature 96.8 F (36 C)   Treatment Initiation   Dialyze Hours 3   Treatment  Initiation Universal Precautions maintained;Lines secured to patient;Connections secured;Prime given;Venous Parameters set;Arterial Parameters set;Air foam detector engaged;Dialysate;Saline line double clamped;Revaclear Dialyzer   Dialysis Bath   K+ (Potassium) 3   Ca+ (Calcium) 2.5   Na+ (Sodium) 140   HCO3 (Bicarb) 37   Bicarbonate Concentrate Lot No. [id number]   Acid Concentrate Lot No. [id number]   Hemodialysis Central Access Right Neck   Placement Date/Time: 06/09/22  1830   Present on Admission/Arrival: No  Inserted by: dr. Rutherford Limerick  Orientation: Right  Access Location: Neck   Continued need for line? Yes   Site Assessment Clean, dry & intact   Venous Lumen Status Flushed;Infusing;Sluggish blood return   Arterial Lumen Status Brisk blood return;Flushed;Infusing   Alcohol Cap Used No   Line Care Connections checked and tightened;Chlorhexidine wipes;Ports disinfected   Dressing Type Transparent   Date of Last Dressing Change 06/19/22   Dressing Status Clean, dry & intact   Dressing Change Due 06/23/22     1715 HD treatment initiated per physicians order.  2020 HD treatment completed per physician order. All possible blood returned to patient. Each catheter limb disinfected for 60 seconds per limb with alcohol swabs. Dialysis CVC hub scrubbed with Prevantics for 15 seconds, followed by a 5 second dry time per Hospital P&P.   +flushed/+hep-lock/+capped.       06/20/22 2020   Vital Signs   BP 139/64   Temp 97.4 F (36.3 C)   Pulse 90   Respirations 24   SpO2 100 %   Pain Assessment   Pain Assessment None - Denies Pain   Post-Hemodialysis Assessment  Post-Treatment Procedures Blood returned;Catheter capped, clamped and heparinized x 2 ports   Machine Disinfection Process Acid/Vinegar Clean;Heat Disinfect;Exterior Engineer, agricultural Volume (ml) 200 ml   Blood Volume Processed (Liters) 66.8 l/min   Dialyzer Clearance Lightly streaked   Duration of Treatment (minutes) 180 minutes   Heparin Amount Administered During Treatment (mL) 0 mL   Hemodialysis Intake (ml) 500 ml   Hemodialysis Output (ml) 2000 ml   NET Removed (ml) 1500   Tolerated Treatment Good   Patient Response to Treatment Well   Edema Generalized   Time Off 2020   Patient Disposition Remain in ICU/ED     Primary RN SBAR: Jaime Link, RN  Comments: Patient in no apparent distress upon departure. Bed remains in ICU positioning and call light within reach.

## 2022-06-20 NOTE — Progress Notes (Signed)
[facility]    ICU TEAM Progress Note    Name: Jaime [name]   DOB: November 09, 1945   MRN: [id number]   Date: 06/20/2022        Subjective:     Reason for ICU Admission: respiratory failure     HPI: 76 y/o male ([facility] [place]) with PMHx significant of HTN, HLD and T2DM. Came in ED 7/30 noon for N/V/Abd pain and generalized weakess. Admitted for intra abdominal septic shock.  S/p ex lap that showed fluid collection in right upper quadrant.  Pt in the ICU 7/31-8/11.  Started on broad spectrum antibiotics.  BC grew E coli.  Being followed by ID. Hosp c/c/b episodes of V-tach and worsening AKI requiring CRRT. Improved and downgraded to step down unit on 8/11.  Early in the morning on 8/13 found to be acutely hypotensive and unresponsive. Patient was transferred to ICU and intubated on 08/13.    8/14: pressor requirement is slowly declining. Remains intubated and sedated. Now on fio2 40%  8/15: started on CVVHD yesterday, pressors being tapered  08/16 - continues to be on mechanical ventilator.   08/17 - Follows command off sedation. On SBT but very debilitated.   08/18 - Follows command off sedation. On SBT but very debilitated.  08/19 RASS 0. CAM-ICU negative. Passed SBT. Extubated. Tolerating well    Home Medications:   Reviewed      SUBJ:   RASS 0. CAM-ICU negative. Passed SBT. Extubated. Tolerating well    Objective:   Vital Signs:  BP (!) 148/63   Pulse 87   Temp 99 F (37.2 C) (Axillary)   Resp 23   Ht 1.727 m (5\' 8" )   Wt 99.7 kg (219 lb 12.8 oz)   SpO2 97%   BMI 33.42 kg/m      Temp (24hrs), Avg:98.8 F (37.1 C), Min:98.2 F (36.8 C), Max:99.2 F (37.3 C)           Intake/Output:     Intake/Output Summary (Last 24 hours) at 06/20/2022 1221  Last data filed at 06/20/2022 0800  Gross per 24 hour   Intake 1370 ml   Output 180 ml   Net 1190 ml         Physical Exam:  Physical Exam:  GEN: RASS 0. CAM-ICU negative. Now extubated. Tolerating well  HEENT: NCAT, sclerae white  NECK: JVP cannot be  assessed (heavily bearded)  CHEST: Scattered B rhonchi  CARDIAC: sinus rhythm, regular, no murmur noted  ABD: Soft, NT, +BS  EXT: Severe gangrenous toes on B feet  NEURO: Generally weak, cranial nerves intact, symmetric strength, no focal deficits noted  DERM: No lesions noted      LABS AND  DATA: Personally reviewed  Recent Labs     06/19/22  0436 06/20/22  0241   WBC 16.4* 15.7*   HGB 8.1* 7.8*   HCT 27.1* 25.7*   PLT 166 152       Recent Labs     06/19/22  0436 06/20/22  0241   NA 136 136   K 3.7 3.9   CL 105 106   CO2 26 24   BUN 34* 60*   MG 2.2 2.3   PHOS 2.9 3.6       Recent Labs     06/19/22  0436   GLOB 4.2*       No results for input(s): INR, APTT in the last 72 hours.    Invalid input(s): PTP  Invalid input(s): PHI, PCO2I, PO2I, FIO2I  No results for input(s): CPK, CKMB in the last 72 hours.    Invalid input(s): TROIQ, BNPP    MEDS: Reviewed    Chest X-Ray: No new film      ECHO: 8/1 : normal EF    Multidisciplinary Rounds Completed:  N/A    ABCDEF Bundle/Checklist Completed: Yes      ICU Assessment/ Comprehensive Plan of Care:   Ventilator dependent respiratory failure - S/P re-intubation 08/13   Extubated 08/19. Tolerating well initially  Paroxysmal AF - NSR presently  AKI with anuria on iHD  S/P laparotomy 07/31  Severe sepsis with septic shock - resolving  RUQ abscess noted on laparotomy   Liver abscess - CT guided drainage 08/04. Fluid cx + for E coli  E coli bacteremia  - + BC from 08/01  Resp culture + for Candida  Numerous gangrenous toes on B feet  ICU acquired anemia without overt blood loss  ICU acquired weakness/deconditioning  Pain        Extubated under my supervision this AM and tolerating well  Monitor respiratory status in ICU post extubation  Encourage frequent cough  If requires re-intubation, will proceed with tracheostomy tube placement  Continue amiodarone for now  Nephrology following - iHD per them today  Monitor I/Os and Uo  Monitor renal function panel intermittently  Correct  electrolytes as needed  Nutritional support - TFs. Advance as tolerated  ID Service following - all antibiotics per them. Probably can discontinue fluconazole soon  Liver drain in place  Wound vac in place  General surgery has signed off  IR will need to re-evaluate for   Follow CB intermittently  Transfuse per usual guidelines  SQH for DVT ppx  Lansoprazole for SUP  PT/OT. Advance activity as tolerated  Podiatry has seen with plans for eventual amputations after complete demarcation               DVT Prophylaxis: SCD's or Sequential Compression Device , SQH  U - Ulcer Prophylaxis: lansoprazole  G - Glycemic Control: SSI  B - Bowel Regimen:   Tubes: Flexi (FMS)  Lines: Central Line and Quinton  Drains: Jackson-Pratt Drain (JP)    DISPOSITION/COMMUNICATION  Discussed Plan of Care/Code Status: Full Code  Stay in ICU        CCM time - 45 minutes.    Billy Fischer, MD  [facility]  [phone]  06/20/2022

## 2022-06-21 ENCOUNTER — Inpatient Hospital Stay: Admit: 2022-06-21 | Payer: MEDICARE

## 2022-06-21 LAB — BASIC METABOLIC PANEL
Anion Gap: 7 mmol/L (ref 5–15)
BUN: 53 MG/DL — ABNORMAL HIGH (ref 6–20)
Bun/Cre Ratio: 18 (ref 12–20)
CO2: 26 mmol/L (ref 21–32)
Calcium: 7.9 MG/DL — ABNORMAL LOW (ref 8.5–10.1)
Chloride: 101 mmol/L (ref 97–108)
Creatinine: 2.87 MG/DL — ABNORMAL HIGH (ref 0.70–1.30)
Est, Glom Filt Rate: 22 mL/min/{1.73_m2} — ABNORMAL LOW (ref >60)
Glucose: 153 mg/dL — ABNORMAL HIGH (ref 65–100)
Potassium: 3.7 mmol/L (ref 3.5–5.1)
Sodium: 134 mmol/L — ABNORMAL LOW (ref 136–145)

## 2022-06-21 LAB — CBC
Hematocrit: 28 % — ABNORMAL LOW (ref 36.6–50.3)
Hemoglobin: 8.4 g/dL — ABNORMAL LOW (ref 12.1–17.0)
MCH: 30.9 PG (ref 26.0–34.0)
MCHC: 30 g/dL (ref 30.0–36.5)
MCV: 102.9 FL — ABNORMAL HIGH (ref 80.0–99.0)
MPV: 11.6 FL (ref 8.9–12.9)
Nucleated RBCs: 0 PER 100 WBC
Platelets: 153 10*3/uL (ref 150–400)
RBC: 2.72 M/uL — ABNORMAL LOW (ref 4.10–5.70)
RDW: 19.9 % — ABNORMAL HIGH (ref 11.5–14.5)
WBC: 19 10*3/uL — ABNORMAL HIGH (ref 4.1–11.1)
nRBC: 0 10*3/uL (ref 0.00–0.01)

## 2022-06-21 LAB — POCT GLUCOSE
POC Glucose: 179 mg/dL — ABNORMAL HIGH (ref 65–117)
POC Glucose: 136 mg/dL — ABNORMAL HIGH (ref 65–117)
POC Glucose: 173 mg/dL — ABNORMAL HIGH (ref 65–117)

## 2022-06-21 MED ORDER — FAMOTIDINE 20 MG PO TABS
20 MG | Freq: Every day | ORAL | Status: DC
Start: 2022-06-21 — End: 2022-06-22
  Administered 2022-06-22: 14:00:00 10 mg via NASOGASTRIC

## 2022-06-21 MED ORDER — THERAPEUTIC MULTIVIT/MINERAL PO TABS
Freq: Every day | ORAL | Status: DC
Start: 2022-06-21 — End: 2022-06-26
  Administered 2022-06-21 – 2022-06-25 (×5): 1 via NASOGASTRIC

## 2022-06-21 MED ORDER — FLUCONAZOLE IN SODIUM CHLORIDE 200-0.9 MG/100ML-% IV SOLN
INTRAVENOUS | Status: DC
Start: 2022-06-21 — End: 2022-06-25
  Administered 2022-06-22 – 2022-06-24 (×2): 200 mg via INTRAVENOUS

## 2022-06-21 MED ORDER — AMIODARONE HCL 200 MG PO TABS
200 | Freq: Every day | ORAL | Status: DC
Start: 2022-06-21 — End: 2022-06-26
  Administered 2022-06-22 – 2022-06-25 (×4): 200 mg via NASOGASTRIC

## 2022-06-21 MED ORDER — HEPARIN SODIUM (PORCINE) 5000 UNIT/ML IJ SOLN
5000 | Freq: Two times a day (BID) | INTRAMUSCULAR | Status: DC
Start: 2022-06-21 — End: 2022-06-26
  Administered 2022-06-22 – 2022-06-26 (×9): 5000 [IU] via SUBCUTANEOUS

## 2022-06-21 MED ORDER — FENTANYL CITRATE (PF) 100 MCG/2ML IJ SOLN
100 | INTRAMUSCULAR | Status: DC | PRN
Start: 2022-06-21 — End: 2022-08-03
  Administered 2022-06-22: 15:00:00 25 ug via INTRAVENOUS

## 2022-06-21 MED FILL — LANTUS 100 UNIT/ML SC SOLN: 100 UNIT/ML | SUBCUTANEOUS | Qty: 1

## 2022-06-21 MED FILL — HEPARIN SODIUM (PORCINE) 5000 UNIT/ML IJ SOLN: 5000 UNIT/ML | INTRAMUSCULAR | Qty: 1

## 2022-06-21 MED FILL — HEPARIN SODIUM (PORCINE) 1000 UNIT/ML IJ SOLN: 1000 UNIT/ML | INTRAMUSCULAR | Qty: 10

## 2022-06-21 MED FILL — LANSOPRAZOLE 30 MG PO TBDD: 30 MG | ORAL | Qty: 1

## 2022-06-21 MED FILL — METRONIDAZOLE 500 MG/100ML IV SOLN: 500 MG/100ML | INTRAVENOUS | Qty: 100

## 2022-06-21 MED FILL — FLUCONAZOLE IN SODIUM CHLORIDE 200-0.9 MG/100ML-% IV SOLN: INTRAVENOUS | Qty: 100

## 2022-06-21 MED FILL — RISAQUAD-2 PO CAPS: ORAL | Qty: 1

## 2022-06-21 MED FILL — CEFEPIME HCL 1 G IJ SOLR: 1 g | INTRAMUSCULAR | Qty: 1000

## 2022-06-21 MED FILL — THERA M PLUS PO TABS: ORAL | Qty: 1

## 2022-06-21 MED FILL — AMIODARONE HCL 200 MG PO TABS: 200 MG | ORAL | Qty: 1

## 2022-06-21 NOTE — Progress Notes (Addendum)
[facility]  [facility]    Progress Note    Date:06/21/2022       Room:2522/01  Patient Name:Jaime [name]     Date of Birth:[birth year]     Age:76 y.o.    Subjective    Subjective   Pt seen at [facility] in the ICU. No new complaints. Pt extubated and tolerating dialysis. No acute events      Review of Systems  Unable to accurately obtain due to current medical status      Objective         Vitals Last 24 Hours:  TEMPERATURE:  Temp  Avg: 98.1 F (36.7 C)  Min: 97.4 F (36.3 C)  Max: 99.2 F (37.3 C)  RESPIRATIONS RANGE: Resp  Avg: 22.3  Min: 15  Max: 28  PULSE OXIMETRY RANGE: SpO2  Avg: 96 %  Min: 88 %  Max: 100 %  PULSE RANGE: Pulse  Avg: 86.5  Min: 74  Max: 94  BLOOD PRESSURE RANGE: Systolic (24hrs), Avg:129 , Min:115 , Max:150   ; Diastolic (24hrs), Avg:59, Min:47, Max:83    I/O (24Hr):    Intake/Output Summary (Last 24 hours) at 06/21/2022 1154  Last data filed at 06/21/2022 1017  Gross per 24 hour   Intake 1296.67 ml   Output 2240 ml   Net -943.33 ml     Objective  GEN: Pt in NAD. Heels elevated. No family at [facility]  DERM:Bilateral toes 1-10 discoloration with gangrenous changes. No proximal lymphatic streaking. No drainage or malodor  VASC: Pedal pulses (DP/PT) diminished to B/L LE. Skin temp is warm to cool from proximal to distal for B/L LE. Neg homans/pratts signs to B/L LE. No varicosities or telangectasias noted to B/L LE.  NEURO: Protective and epicritic sensations grossly intact to B/L LE  MSK: (-) POP, No gross deformities. Good muscle tone and bulk noted to B/L LE.  PSYCH: Sedated      Labs/Imaging/Diagnostics    Labs:  CBC:  Recent Labs     06/19/22  0436 06/20/22  0241 06/21/22  0343   WBC 16.4* 15.7* 19.0*   RBC 2.64* 2.48* 2.72*   HGB 8.1* 7.8* 8.4*   HCT 27.1* 25.7* 28.0*   MCV 102.7* 103.6* 102.9*   RDW 19.3* 19.7* 19.9*   PLT 166 152 153     CHEMISTRIES:  Recent Labs     06/19/22  0436 06/20/22  0241 06/21/22  0343   NA 136 136 134*   K 3.7 3.9 3.7   CL 105 106 101    CO2 26 24 26    BUN 34* 60* 53*   CREATININE 2.26* 3.30* 2.87*   GLUCOSE 170* 192* 153*   PHOS 2.9 3.6  --    MG 2.2 2.3  --      PT/INR:No results for input(s): PROTIME, INR in the last 72 hours.  APTT:No results for input(s): APTT in the last 72 hours.  LIVER PROFILE:  Recent Labs     06/19/22  0436   AST 84*   ALT 45   BILIDIR 1.4*   BILITOT 2.5*   ALKPHOS 293*       Imaging Last 24 Hours:  No results found.      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 Yes    Thrombocytopenia (HCC) 06/05/2022 Yes    E coli bacteremia 06/08/2022 Yes    Hepatic abscess 06/08/2022 Yes    Peripheral arterial disease (HCC) 06/08/2022 Yes    Hyperbilirubinemia 06/08/2022 Yes    Gram negative sepsis (HCC) 06/08/2022 Yes    Septicemia (HCC) 06/09/2022 Yes    Aspiration pneumonia of both lower lobes (HCC) 06/12/2022 Yes    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  - Cont local wound care and offloading while in bed  - We will continue to monitor patient and update surgical plan if needed    WB Status: As tolerated for transfers to B/L LE  Wound Care: Paint with all toes with betadine daily          Gregor Dershem A. Giliana Vantil,  DPM, CWSP, AACFAS    [facility]  [address] [place], [place] [address]  O: [phone]  F: [phone]    [facility] - [facility] (Opening Sept 2023)  [address] [place]  [place], [place] [address]  O: [phone]  F: [phone]    [facility]  [address]  [place], [place] [address]  O: [phone]  F: [phone]    * Available via [facility] 24/7    Electronically signed by Rosana Fret, DPM on 06/21/22 at 11:54 AM EDT

## 2022-06-21 NOTE — Progress Notes (Addendum)
0730 - Bedside and Verbal shift change report given to Misty Stanley, RN and Ladona Ridgel, RN (Cabin crew) by Harriett Sine, RN (offgoing nurse). Report included the following information Index and Adult Overview.       0800 - Pt A/Ox4. Pt on 2L NC. O2 at 96%. Pt denies pain at this time.     0900 - Intentivist wanted a PIV and IJ removal. Numerous attempts including with ultrasound were unsuccessful. Pt turned and mouth care performed.     1100 - Pt is on room air now. O2 in the 90's. Pt turned and performed mouth care. Mouth was suctioned. Bladder scan showed 47 mL.     1300 - Pt performed Incentive Spirometry. Pt needs encouragement.     1600 - Gave pt bath and cleanse toes with  betadine.      1930 - Bedside and Verbal shift change report given to Maralyn Sago, Charity fundraiser.  (oncoming nurse) by Misty Stanley, RN and Ladona Ridgel, RN (offgoing nurse). Report included the following information Nurse Handoff Report, Index, and Adult Overview.

## 2022-06-21 NOTE — Progress Notes (Signed)
[facility]    ICU TEAM Progress Note    Name: Jaime [name]   DOB: May 31, 1946   MRN: [id number]   Date: 06/21/2022        Subjective:     Reason for ICU Admission: respiratory failure     HPI: 75 y/o male ([facility] [place]) with PMHx significant of HTN, HLD and T2DM. Came in ED 7/30 noon for N/V/Abd pain and generalized weakess. Admitted for intra abdominal septic shock.  S/p ex lap that showed fluid collection in right upper quadrant.  Pt in the ICU 7/31-8/11.  Started on broad spectrum antibiotics.  BC grew E coli.  Being followed by ID. Hosp c/c/b episodes of V-tach and worsening AKI requiring CRRT. Improved and downgraded to step down unit on 8/11.  Early in the morning on 8/13 found to be acutely hypotensive and unresponsive. Patient was transferred to ICU and intubated on 08/13.    8/14: pressor requirement is slowly declining. Remains intubated and sedated. Now on fio2 40%  8/15: started on CVVHD yesterday, pressors being tapered  08/16 - continues to be on mechanical ventilator.   08/17 - Follows command off sedation. On SBT but very debilitated.   08/18 - Follows command off sedation. On SBT but very debilitated.  08/19 RASS 0. CAM-ICU negative. Passed SBT. Extubated. Tolerating well  08/20 Tolerating extubation well. Comfortable on NC O2 2 LPM. No new complaints. Limited venous access - RN unable to place PIV    Home Medications:   Reviewed      SUBJ:   RASS 0. CAM-ICU negative. No new complaints    Objective:   Vital Signs:  BP (!) 140/51   Pulse 81   Temp 98.8 F (37.1 C) (Axillary)   Resp 17   Ht 1.727 m (5\' 8" )   Wt 99.7 kg (219 lb 12.8 oz)   SpO2 97%   BMI 33.42 kg/m      Temp (24hrs), Avg:98.1 F (36.7 C), Min:97.4 F (36.3 C), Max:99.2 F (37.3 C)           Intake/Output:     Intake/Output Summary (Last 24 hours) at 06/21/2022 1259  Last data filed at 06/21/2022 1200  Gross per 24 hour   Intake 1306.67 ml   Output 2250 ml   Net -943.33 ml         Physical Exam:  Physical  Exam:  GEN: RASS 0. CAM-ICU negative.  HEENT: NCAT, sclerae white  NECK: JVP cannot be assessed (heavily bearded)  CHEST: Few scattered B rhonchi  CARDIAC: sinus rhythm, regular, no murmur noted  ABD: Soft, NT, +BS  EXT: multiple gangrenous toes on B feet, BUE edema  NEURO: Generally weak, cranial nerves intact, no focal deficits noted  DERM: No lesions noted      LABS AND  DATA: Personally reviewed  Recent Labs     06/20/22  0241 06/21/22  0343   WBC 15.7* 19.0*   HGB 7.8* 8.4*   HCT 25.7* 28.0*   PLT 152 153       Recent Labs     06/19/22  0436 06/20/22  0241 06/21/22  0343   NA 136 136 134*   K 3.7 3.9 3.7   CL 105 106 101   CO2 26 24 26    BUN 34* 60* 53*   MG 2.2 2.3  --    PHOS 2.9 3.6  --        Recent Labs  06/19/22  0436   GLOB 4.2*       No results for input(s): INR, APTT in the last 72 hours.    Invalid input(s): PTP     Invalid input(s): PHI, PCO2I, PO2I, FIO2I  No results for input(s): CPK, CKMB in the last 72 hours.    Invalid input(s): TROIQ, BNPP    MEDS: Reviewed    Chest X-Ray: pending      ECHO: 8/1 : normal EF    Multidisciplinary Rounds Completed:  N/A    ABCDEF Bundle/Checklist Completed: Yes      ICU Assessment/ Comprehensive Plan of Care:   Acute hypoxic respiratory failure - S/P re-intubation 08/13   Extubated 08/19. Tolerating well initially  NSVT and PAF - NSR presently  AKI with anuria on iHD  S/P laparotomy 07/31  Severe sepsis - resolving  RUQ abscess noted during laparotomy   Liver abscess - CT guided drainage 08/04. Fluid cx + for E coli  E coli bacteremia  - + BC from 08/01  Resp culture + for Candida  Numerous gangrenous toes on B feet  ICU acquired anemia without overt blood loss  ICU acquired weakness/deconditioning  Post op pain - controlled  Limited venous access - RN unable to place PIV 08/20    Cont to monitor in ICU at least through today  Supplemental O2 as needed to maintain SpO2 > 90%  Encourage frequent cough and airway hygiene  If requires re-intubation, will proceed  with tracheostomy tube placement  Continue amiodarone for now per Cardiology recs (Dr Robina Ade)  HD schedule per Nephrology   Monitor I/Os and Uo  Monitor renal function panel intermittently  Correct electrolytes as needed  Bladder scans  Nutritional support - TFs. Advance as tolerated  Consider SLP swallow eval this upcoming week  ID Service following - all antibiotics per them (cefepime, metronidazole, fluconazole)  Liver drain in place  Wound vac in place  General surgery has signed off  Wound Care Service following  IR will need to re-evaluate for timing of removal of RUQ/liver abscess drain  Follow CB intermittently  Transfuse per usual guidelines  Cont SQH for DVT ppx  SUP: not indicated. DC lansoprazole. Famotidine (renally adjusted) X 5 days to prevent rebound hyperacidity  PT/OT involved. Advance activity as tolerated  IV team to assess for PIV placement 08/21 with goal of removal of L IJ CVL  Podiatry has seen with plans for eventual amputations after complete demarcation         DVT Prophylaxis: SCD's or Sequential Compression Device , SQH  U - Ulcer Prophylaxis: famotidine  G - Glycemic Control: SSI  B - Bowel Regimen:   Tubes: Flexi (FMS)  Lines: Central Line and Quinton  Drains: Jackson-Pratt Drain (JP)    DISPOSITION/COMMUNICATION  Discussed Plan of Care/Code Status: Full Code  Stay in ICU        CCM time - 35 minutes.    Billy Fischer, MD  [facility]  [phone]  06/21/2022

## 2022-06-21 NOTE — Progress Notes (Signed)
Name: Jaime [name]   MRN: [id number]  DOB: 1945/12/11  06/21/2022 8:36 AM      Admit Date: 05/31/2022    Admit Diagnosis: Septicemia (HCC) [A41.9]  Gastric perforation (HCC) [K25.5]  Perforated abdominal viscus [R19.8]    Assessment/Plan:             06/21/22     Assessment:         AKI/ATN- anuric- Grade 3-  Resp failure Vent Dependent-FiO2 50%  Septic shock-still on low-dose pressor  Pancreatitis   Hyponatremia-sodium to 136  Hypoalbuminemia with albumin of 1.7 due to chronic inflammation  Lipase elevated at 737 suggestive of pancreatitis, along with CT scan  Encephalopathy  Acute abdomen from abdominal viscus perforation    Liver abscess  S/p emergent exploratory laparotomy 06/01/22    Type 2 DM  Shock liver  Acute on chronic anemia              Discussion:        CRRT on hold  HD done on 8.19.23;will likely need HD in AM  Looks better  Right IJ quinton 8/8   Severe PAD with gangrene of toes  Discussed with him and RN     Ancil Linsey, MD           Signed By: Ancil Linsey, MD     June 21, 2022         Subjective:   Extubated,feels better    Review of Systems:  Review of systems not obtained due to patient factors.   Objective:    BP (!) 139/51   Pulse 84   Temp 98.6 F (37 C) (Axillary)   Resp 21   Ht 1.727 m (5\' 8" )   Wt 99.7 kg (219 lb 12.8 oz)   SpO2 (!) 89%   BMI 33.42 kg/m     Physical Exam:     Access- R IJ Dialysis access  L ij tLC   +edema    Recent Results (from the past 24 hour(s))   POCT Glucose    Collection Time: 06/20/22 12:10 PM   Result Value Ref Range    POC Glucose 225 (H) 65 - 117 mg/dL    Performed by: 06/22/22 RN    POCT Glucose    Collection Time: 06/20/22  5:57 PM   Result Value Ref Range    POC Glucose 158 (H) 65 - 117 mg/dL    Performed by: 06/22/22 RN    Basic Metabolic Panel    Collection Time: 06/21/22  3:43 AM   Result Value Ref Range    Sodium 134 (L) 136 - 145 mmol/L     Potassium 3.7 3.5 - 5.1 mmol/L    Chloride 101 97 - 108 mmol/L    CO2 26 21 - 32 mmol/L    Anion Gap 7 5 - 15 mmol/L    Glucose 153 (H) 65 - 100 mg/dL    BUN 53 (H) 6 - 20 MG/DL    Creatinine 06/23/22 (H) 0.70 - 1.30 MG/DL    Bun/Cre Ratio 18 12 - 20      Est, Glom Filt Rate 22 (L) >60 ml/min/1.48m2    Calcium 7.9 (L) 8.5 - 10.1 MG/DL   CBC    Collection Time: 06/21/22  3:43 AM   Result Value Ref Range    WBC 19.0 (H) 4.1 - 11.1 K/uL    RBC 2.72 (L) 4.10 - 5.70 M/uL    Hemoglobin 8.4 (L) 12.1 - 17.0  g/dL    Hematocrit 69.6 (L) 36.6 - 50.3 %    MCV 102.9 (H) 80.0 - 99.0 FL    MCH 30.9 26.0 - 34.0 PG    MCHC 30.0 30.0 - 36.5 g/dL    RDW 29.5 (H) 28.4 - 14.5 %    Platelets 153 150 - 400 K/uL    MPV 11.6 8.9 - 12.9 FL    Nucleated RBCs 0.0 0 PER 100 WBC    nRBC 0.00 0.00 - 0.01 K/uL   POCT Glucose    Collection Time: 06/21/22  7:45 AM   Result Value Ref Range    POC Glucose 179 (H) 65 - 117 mg/dL    Performed by: Oneal Deputy RN           Intake/Output Summary (Last 24 hours) at 06/21/2022 0836  Last data filed at 06/21/2022 0700  Gross per 24 hour   Intake 1296.67 ml   Output 2020 ml   Net -723.33 ml            Data Review:   Recent Labs     06/21/22  0343   NA 134*   K 3.7   BUN 53*   CREATININE 2.87*   WBC 19.0*   HGB 8.4*   HCT 28.0*   PLT 153                  Signed By: Ancil Linsey, MD     June 21, 2022       T

## 2022-06-21 NOTE — Progress Notes (Addendum)
1930: Bedside report received from Satira Anis, RN   Dual skin assessment completed, Pt repositioned to right side    2000: Assessment completed.     2125: Turned on left side. Rectal tube irrigated and pad changed. Zinc cream applied to area

## 2022-06-22 LAB — BASIC METABOLIC PANEL
Anion Gap: 7 mmol/L (ref 5–15)
BUN: 76 MG/DL — ABNORMAL HIGH (ref 6–20)
Bun/Cre Ratio: 20 (ref 12–20)
CO2: 25 mmol/L (ref 21–32)
Calcium: 8.1 MG/DL — ABNORMAL LOW (ref 8.5–10.1)
Chloride: 101 mmol/L (ref 97–108)
Creatinine: 3.87 MG/DL — ABNORMAL HIGH (ref 0.70–1.30)
Est, Glom Filt Rate: 15 mL/min/{1.73_m2} — ABNORMAL LOW (ref >60)
Glucose: 152 mg/dL — ABNORMAL HIGH (ref 65–100)
Potassium: 3.7 mmol/L (ref 3.5–5.1)
Sodium: 133 mmol/L — ABNORMAL LOW (ref 136–145)

## 2022-06-22 LAB — CBC
Hematocrit: 26 % — ABNORMAL LOW (ref 36.6–50.3)
Hemoglobin: 8.1 g/dL — ABNORMAL LOW (ref 12.1–17.0)
MCH: 31.9 PG (ref 26.0–34.0)
MCHC: 31.2 g/dL (ref 30.0–36.5)
MCV: 102.4 FL — ABNORMAL HIGH (ref 80.0–99.0)
MPV: 11.9 FL (ref 8.9–12.9)
Nucleated RBCs: 0 PER 100 WBC
Platelets: 163 10*3/uL (ref 150–400)
RBC: 2.54 M/uL — ABNORMAL LOW (ref 4.10–5.70)
RDW: 19.9 % — ABNORMAL HIGH (ref 11.5–14.5)
WBC: 17.8 10*3/uL — ABNORMAL HIGH (ref 4.1–11.1)
nRBC: 0 10*3/uL (ref 0.00–0.01)

## 2022-06-22 LAB — POCT GLUCOSE
POC Glucose: 120 mg/dL — ABNORMAL HIGH (ref 65–117)
POC Glucose: 146 mg/dL — ABNORMAL HIGH (ref 65–117)
POC Glucose: 166 mg/dL — ABNORMAL HIGH (ref 65–117)
POC Glucose: 132 mg/dL — ABNORMAL HIGH (ref 65–117)

## 2022-06-22 LAB — HEPATIC FUNCTION PANEL
ALT: 31 U/L (ref 12–78)
AST: 49 U/L — ABNORMAL HIGH (ref 15–37)
Albumin/Globulin Ratio: 0.4 — ABNORMAL LOW (ref 1.1–2.2)
Albumin: 1.6 g/dL — ABNORMAL LOW (ref 3.5–5.0)
Alk Phosphatase: 216 U/L — ABNORMAL HIGH (ref 45–117)
Bilirubin, Direct: 1.1 MG/DL — ABNORMAL HIGH (ref 0.0–0.2)
Globulin: 4.5 g/dL — ABNORMAL HIGH (ref 2.0–4.0)
Total Bilirubin: 1.7 MG/DL — ABNORMAL HIGH (ref 0.2–1.0)
Total Protein: 6.1 g/dL — ABNORMAL LOW (ref 6.4–8.2)

## 2022-06-22 MED ORDER — EPOETIN ALFA-EPBX 3000 UNIT/ML IJ SOLN
3000 | INTRAMUSCULAR | Status: DC
Start: 2022-06-22 — End: 2022-08-03
  Administered 2022-06-23 – 2022-08-01 (×18): 6000 [IU] via SUBCUTANEOUS

## 2022-06-22 MED ORDER — FAMOTIDINE 20 MG PO TABS
20 MG | Freq: Every day | ORAL | Status: AC
Start: 2022-06-22 — End: 2022-06-27
  Administered 2022-06-23 – 2022-06-25 (×3): 20 mg via NASOGASTRIC

## 2022-06-22 MED FILL — METRONIDAZOLE 500 MG/100ML IV SOLN: 500 MG/100ML | INTRAVENOUS | Qty: 100

## 2022-06-22 MED FILL — FLUCONAZOLE IN SODIUM CHLORIDE 200-0.9 MG/100ML-% IV SOLN: INTRAVENOUS | Qty: 100

## 2022-06-22 MED FILL — FAMOTIDINE 20 MG PO TABS: 20 MG | ORAL | Qty: 1

## 2022-06-22 MED FILL — HEPARIN SODIUM (PORCINE) 5000 UNIT/ML IJ SOLN: 5000 UNIT/ML | INTRAMUSCULAR | Qty: 1

## 2022-06-22 MED FILL — FENTANYL CITRATE (PF) 100 MCG/2ML IJ SOLN: 100 MCG/2ML | INTRAMUSCULAR | Qty: 2

## 2022-06-22 MED FILL — AMIODARONE HCL 200 MG PO TABS: 200 MG | ORAL | Qty: 1

## 2022-06-22 MED FILL — CEFEPIME HCL 1 G IJ SOLR: 1 g | INTRAMUSCULAR | Qty: 1000

## 2022-06-22 MED FILL — RISAQUAD-2 PO CAPS: ORAL | Qty: 1

## 2022-06-22 MED FILL — LANTUS 100 UNIT/ML SC SOLN: 100 UNIT/ML | SUBCUTANEOUS | Qty: 1

## 2022-06-22 MED FILL — THERA M PLUS PO TABS: ORAL | Qty: 1

## 2022-06-22 MED FILL — HEPARIN SODIUM (PORCINE) 1000 UNIT/ML IJ SOLN: 1000 UNIT/ML | INTRAMUSCULAR | Qty: 10

## 2022-06-22 MED FILL — RETACRIT 3000 UNIT/ML IJ SOLN: 3000 UNIT/ML | INTRAMUSCULAR | Qty: 2

## 2022-06-22 NOTE — Interdisciplinary (Signed)
Critical care interdisciplinary rounds today.  Following members present: Case Management, Diabetes Treatment, Nursing, Nutrition, Pharmacy, and Physician.   Plan of care discussed.  See clinical pathway for plan of care and interventions and desired outcomes.

## 2022-06-22 NOTE — Wound Image (Addendum)
Wound care follow up and Reassessment of the Abdominal surgical wound that dehisced last week. The culture that grew out for the wound was negative.   Dr. Driscilla Moats and Bella Kennedy, NP (surgery) were at the bedside for the reassessment today.  Pt. Is extubated and talking well and interacting well. Answering questions about his home, family. He has had a good experience at [facility] (per his words).   Assessment: there is a lot more granulation tissue in the wound today but there is a mid area of slough that covers about 3/4 of the wound bed. The base of the wound appears to have a good foundation (not boggy). Pt. Coughed and there is no bulging noted. The wound measures 24 x 3.5 x 2.8 cm.         Treatment: the wound was cleansed with NS and wiped with gauze. Today we applied the Veriflo cleanse to instill Vashe solution into the wound every 4 hours to "rinse" the wound and the solution will dwell for 6 minutes and then vacuum back out and become a regular wound vac for 4 hours. The machine is pre-programed to perform this. The wound is leak proof so far and two rounds of instillation and vacuum were done prior to exiting the room. Pt. Tolerated it well.     Nursing will need to change the Vac canisters as needed when full or almost full. Keep track of the instillation fluid and change the bottle as needed to keep the Veriflo going on program. In addition to the fluid being vacuumed out of the wound the patient will have some wound drainage that has to be counted in the output.    Plan:  Continue the pressure injury prevention and treatment. Once the DTI has completely demarcated (will check it again on Wednesday and will start Santyl to enzymatically debride the wound to healing.   Pt will need Re-Hab, wound care for the sacrum, buttocks and eventually will have to have the toes amputated.   Encourage mobility as tolerated.   Wound care nurse to see the patient daily for wound vac function and for periodic  reassessments of the wounds.   Three bottles of the Vashe solution were left in his room.   Ronnald Collum, RN, BSN, [employer]

## 2022-06-22 NOTE — Progress Notes (Signed)
Comprehensive Nutrition Assessment    Type and Reason for Visit:  Reassess    Nutrition Recommendations/Plan:   Increase TF rate to Nepro @ 15m/h + Prosource BID + 579mflush q 4h (provides 2104kcals/127gPro/173gCHO/10856m  Advance diet as able per SLP  Consider antidiarrheal, Banatrol is incompatible with dobbhoff      Malnutrition Assessment:  Malnutrition Status:  Insufficient data (06/04/22 1319)        Nutrition Assessment:  Chart reviewed, case discussed during CCU rounds.  Pt extubated, needs re-estimated.  Pt speaking with case management at time of first visit attempt, getting set up for HD at time of second attempt.  SLP consulted for potential diet advancement, pt did not pass for full diet (only ice chips per RN).  Toes are gangrenous and will eventually need amputation.  Wound care note increased granulation tissue during wound vac placement today, a good sign of adequate protein provision.  Will adjust TF to meet 100% kcal and protein needs.  Will monitor ability to advance to PO diet.  K 3.7 and phos 3.9 (on Saturday) so will keep Nepro for now.        Nutrition Related Findings:    Meds: cefepime, pepcid, diflucan, lantus, huamlog, flagyl, MVI. Edema: anasarca, +2-all extremities.  BM: FMS Wound Type: Surgical Incision, Wound Vac, Deep Tissue Injury  (sacrum)     Current Nutrition Intake & Therapies:    Average Meal Intake: NPO     Current Tube Feeding (TF) Orders:  Feeding Route: Nasogastric  Formula: Renal Formula  Schedule: Continuous  Feeding Regimen: 1m53m Additives/Modulars: Protein (Prosource 4x)  Water Flushes: 50mL72mh  Current TF & Flush Orders Provides: 1616kcals/138gPro/115gCHO/823mL 76ml TF & Flush Orders Provides: 2104kcals/127gPro/173gCHO/1085mL  86mthropometric Measures:  Height: 172.7 cm ('5\' 8"'$ )  Ideal Body Weight (IBW): 154 lbs (70 kg)       Current Body Weight: 239 lb 3.2 oz (108.5 kg), 154.2 % IBW. Weight Source: Bed Scale  Current BMI (kg/m2): 36.4                           BMI Categories: Obese Class 2 (BMI 35.0 -39.9) (not reliable due to anasarca)    Estimated Daily Nutrient Needs:  Energy Requirements Based On: Formula  Weight Used for Energy Requirements: Current  Energy (kcal/day): MSJ 2150 (1795 x 1.2)  Weight Used for Protein Requirements: Ideal  Protein (g/day): 119-140g (1.7-2gPro/kg IBW)  Method Used for Fluid Requirements: Other (Comment)  Fluid (ml/day): per MD    Nutrition Diagnosis:   Inadequate protein-energy intake related to impaired respiratory function as evidenced by NPO or clear liquid status due to medical condition  Previous dx resolving.     Nutrition Interventions:   Food and/or Nutrient Delivery: Modify Tube Feeding  Nutrition Education/Counseling: No recommendation at this time  Coordination of Nutrition Care: Continue to monitor while inpatient, Interdisciplinary Rounds       Goals:  Previous Goal Met: Goal(s) Achieved  Goals: Tolerate nutrition support at goal rate, by next RD assessment (with lytes WNL and no signs/sx of intolerance)       Nutrition Monitoring and Evaluation:   Behavioral-Environmental Outcomes: None Identified  Food/Nutrient Intake Outcomes: Enteral Nutrition Intake/Tolerance  Physical Signs/Symptoms Outcomes: Biochemical Data, Nutrition Focused Physical Findings, Skin, Weight, GI Status, Hemodynamic Status, Fluid Status or Edema    Discharge Planning:    Too soon to determine     KatheriEarney Mallet  RD, CNSC  Contact: ext [phone]

## 2022-06-22 NOTE — Progress Notes (Signed)
Infectious Disease Progress    Impression    Sepsis  S/p septic shock  Remains on low dose pressors    Acute hypoxic respiratory failure  Re intubated 8/13, extubated 8/19  Initially intubated 7/31, s/p extubation 8/7  CXR+ for pulmonary edema.  Resp cultures + for light yeast  Apparent C.albicans/ dubliensis    E. coli bacteremia  Blood cultures 7/31+ for E. coli   2/2 LAC (pan sensitive)   Negative repeat cultures 8/4       Acute abdomen  Pneumoperitoneum  S/p diagnostic laparoscopy, laparotomy  EGD 7/31  Findings of cloudy fluid in right upper quadrant, yellow/white  Inflammatory peel over lesser curve of stomach, inferior lobe of liver  No gross perforation identified  Pancreas and retroperitoneum appeared edematous  Intra-Op fluid culture + for E. Coli (pansensitive)    Hepatic abscess  Gas and fluid containing collection 7.1 x 10.8 x 5.4 cm  In anterior left lobe of liver, patchy areas of hypodensity right lobe  4 x 4.5 x 5.5 cm collection  S/p drainage by IR 8/4.Cultures + for  few E.coli  Repeat CT 8/7 shows reduction in size of hepatic abscess  Plan was for ceftriaxone IV x6 weeks end date 9/15, Flagyl  Until 8/18  Repeat CT 8/13 + for    S/p percutaneous drainage of left hepatic abscess with  Minimal fluid remaining around catheter, suggestion of peripancreatic edema, small bilateral pleural effusions  Repeat CT abdomen pelvis 8/16+ for increased peripancreatic edema suspicious for pancreatitis, small residual fluid collection in left hepatic lobe  Lipase 737      Persistent leukocytosis  WBC 17.8  S/p abdominal wound dehiscence  Wound exam done with wound care  Clean, no purulence  Cultures 8/17, 8/18-NG.    Gangrene, discoloration of toes  Persist      AKI  Cr 3.87  on CVVD    Coagulopathy  Improving    Thrombocytopenia  improved  platelets 163    Transaminitis  Improving    Hyperbilirubinemia   Bilirubin 1.7    Diabetes type 2  Hyperglycemia  A1c 7.7    Diarrhea  FMS present    Obesity  BMI  34.76    Lines-drain -7/31, TLC-8/13, HD access-8/8,   E T-8/13, FMS 8/14    Plan  Continue Cefepime, Flagyl Fluconazole x7 days until 8/24  Reevaluate wound prior to de-escalation of therapy  Wound care per wound care team instructions  ID service to follow.    Extensive review of chart notes, labs, imaging, cultures done  Additionally review of done:  D/w ICU team, Dr. Salena Saner    Patient seen today.  Awake, talking  D/w general surgery NP and wound care      History reviewed. No pertinent past medical history.    Past Surgical History:   Procedure Laterality Date    BLADDER SURGERY N/A 06/01/2022    ENDOSCOPY OF ILEAL CONDUIT performed by Guinevere Ferrari, MD at [facility] OR    CT VISCERAL PERCUTANEOUS DRAIN  06/05/2022    CT VISCERAL PERCUTANEOUS DRAIN 06/05/2022 [facility] RAD CT    LAPAROSCOPY N/A 06/01/2022    LAPAROSCOPY DIAGNOSTIC performed by Guinevere Ferrari, MD at [facility]    LAPAROTOMY N/A 06/01/2022    LAPAROTOMY EXPLORATORY performed by Guinevere Ferrari, MD at [facility]       Allergies   Allergen Reactions    Augmentin [Amoxicillin-Pot Clavulanate] Hives     Tolerated ceftriaxone 06/2022  Codeine      Unknown reaction       Social Connections: Not on file       No family status information on file.           Review of Systems - Negative except those mentioned in H&P      PHYSICAL EXAM:  General:          Awake, in no distress  EENT:              EOMI. Anicteric sclerae. MMM  Resp:               CTA bilaterally, no wheezing or rales.  No accessory muscle use  CV:                  Regular  rhythm,  No edema  GI:                   Soft, Non distended, Non tender.  +Bowel sounds  Neurologic:      Alert and oriented X 3, normal speech,   Psych:             Good insight. Not anxious nor agitated  Skin:                No rashes.  No jaundice.  Extremities :  No edema, discoloration of  toes++.     Waymon Amato, MD Jerrel Ivory

## 2022-06-22 NOTE — Plan of Care (Signed)
Speech Language Pathology  Flexible Endoscopic Evaluation of Swallowing-FEES  Patient: Jaime [name] (76 y.o. male)  Date: 06/22/2022  Primary Diagnosis: Septicemia ([place]) [A41.9]  Gastric perforation ([place]) [K25.5]  Perforated abdominal viscus [R19.8]  Procedure(s) (LRB):  LAPAROTOMY EXPLORATORY (N/A)  LAPAROSCOPY DIAGNOSTIC (N/A)  ENDOSCOPY OF ILEAL CONDUIT (N/A) 21 Days Post-Op   Precautions: Aspiration, Fall Risk    ASSESSMENT :  Patient presents with significant pharyngeal dysphagia based on FEES findings. Oral phase of swallow revealed slowed but functional bolus transit with more viscous consistencies. Did not assess mastication. Pharyngeal phase of swallow significant for delayed swallow initiation, incomplete and abbreviated epiglottic inversion, and reduced pharyngeal squeeze and bolus clearance. Trace aspiration of thin liquids and mildly thick liquids visualized. Deep laryngeal penetration of moderately thick liquids visualized. No penetration or aspiration of pureed visualized. Significant pharyngeal residue both in valleculae and pyriform sinuses seen after swallow. 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 Despite cued additional swallows, residuals were not fully cleared. Sensation was variable for aspiration. Sensation of pharyngeal residue was diminished. Did not appreciate proximal escape from below PES. Recommend patient remain NPO except ice chips with dysphagia therapy to include strengthening exercises and therapeutic PO trials with SLP.     Patient will benefit from skilled intervention to address the above impairments.     PLAN :  Recommendations and Planned Interventions:  Diet: NPO and ice chips  Continue temporary means of nutrition/hydration/medications  Oral care 3-4x/daily while NPO    Acute SLP Services: Yes, patient will be followed by speech-language pathology 3x/week to address goals. Patient's rehabilitation potential is considered to be Fair.    Discharge  Recommendations: Yes, recommend SLP treatment at next level of care     SUBJECTIVE:   Patient stated, "That makes sense; thank you" when discussing results of test.     OBJECTIVE:   History reviewed. No pertinent past medical history.  Past Surgical History:   Procedure Laterality Date    BLADDER SURGERY N/A 06/01/2022    ENDOSCOPY OF ILEAL CONDUIT performed by Melene Plan, MD at [facility]    CT VISCERAL PERCUTANEOUS DRAIN  06/05/2022    CT VISCERAL PERCUTANEOUS DRAIN 06/05/2022 [facility] CT    LAPAROSCOPY N/A 06/01/2022    LAPAROSCOPY DIAGNOSTIC performed by Melene Plan, MD at [facility]    LAPAROTOMY N/A 06/01/2022    LAPAROTOMY EXPLORATORY performed by Melene Plan, MD at [facility]     Diet prior to admission: Regular texture diet with thin liquids  Current Diet: NPO     Cognitive and Communication Status:  Neurologic State: Alert  Orientation Level: Oriented x4  Cognition: Appropriate for age attention/concentration and Follows commands    History/indication for procedure: Intubation x2, Hx of aspiration on prior FEES  Lidocaine used: No  Nostril used: right  Scope Used: Ambu disposable scope  Feeding Tube Present in Nare: Yes, dobhoff tube  Adverse Reaction: No  Respiratory status: Room air    Part I:  Anatomy:  Labial: No impairment  Dentition: Natural;Limited  Oral Hygiene: Dried secretions  Lingual: Atrophy, ?Bruising  Velum: No Impairment  Mandible: No impairment    General Comments:    Palate:   WFL Base of tongue:   WFL   Valleculae:   Impaired: Residue Epiglottis:   Impaired: Abbreviated, Incomplete   Arytenoids:   WFL False vocal folds:   WFL   Vocal folds:   WFL Pyriform sinus:   Impaired: Residue  Part II:  Laryngeal Function:  Adduction:   Full Abduction:   Full   Arytenoid movement:   Symmetric Vocal quality:   Mildly rough  (Frequent coughing)     Part III:  Secretions:  [place] Secretion Rating (0-7): 0   Location:  0 - Nil significant pooled secretions in pyriform fossae or laryngeal  vestibule Amount:   0 - Nil significant pooled secretions in pyriform fossae (0-20%) Response*:  0 - Secretions in pyriform fossae or laryngeal vestibule effectively cleared   Comments (e.g., quality/texture/color): NA   *Normal airway responses in the pharynx or laryngeal vestibule may include spontaneous coughing, throat clearing, and/or swallowing    Part IV:  Swallow Trials:  Consistency: Ice chips  Position of Bolus Pre-Swallow: Pyriform sinuses  [place] Pharyngeal Residue Severity Rating Scale: Valleculae residue: 2 - trace; 1-5%, trace coating of the mucosa and Pyriform sinus residue: 3 - mild; 5-25%, up wall to quarter full  Penetration: Yes  Aspiration: Yes  Rosenbek Penetration-Aspiration Scale: 8 - Material enters the airway, passes below the vocal folds, and no effort is made to eject    Consistency: Thin liquid  Position of Bolus Pre-Swallow: Pyriform sinuses  [place] Pharyngeal Residue Severity Rating Scale: Valleculae residue: 2 - trace; 1-5%, trace coating of the mucosa and Pyriform sinus residue: 3 - mild; 5-25%, up wall to quarter full  Penetration: Yes  Aspiration: Yes  Rosenbek Penetration-Aspiration Scale: 7 - Material enters the airway, passes below the vocal folds, and is not ejected from the trachea, despite effort    Consistency: Mildly thick liquid  Position of Bolus Pre-Swallow: Pyriform sinuses  [place] Pharyngeal Residue Severity Rating Scale: Valleculae residue: 3 - mild; 5-25%, epiglottic ligament visible and Pyriform sinus residue: 4 - moderate; 25-50%, up wall to half full  Penetration: Yes  Aspiration: Yes  Rosenbek Penetration-Aspiration Scale: 8 - Material enters the airway, passes below the vocal folds, and no effort is made to eject    Consistency: Moderately thick liquid  Position of Bolus Pre-Swallow: Pyriform sinuses  [place] Pharyngeal Residue Severity Rating Scale: Valleculae residue: 4 - moderate; 25-50%, epiglottic ligament covered and Pyriform sinus residue: 5 - severe; >50%, filled  to aryepiglottic fold  Penetration: Yes  Aspiration: No  Rosenbek Penetration-Aspiration Scale: 5 - Material enters the airway, contacts the vocal folds, and is not ejected from the airway    Consistency: Puree  Position of Bolus Pre-Swallow: Over epiglottic rim  [place] Pharyngeal Residue Severity Rating Scale: Valleculae residue: 4 - moderate; 25-50%, epiglottic ligament covered and Pyriform sinus residue: 4 - moderate; 25-50%, up wall to half full  Penetration: No  Aspiration: No  Rosenbek Penetration-Aspiration Scale: 1 - Material does not enter the airway    NOMS:   The NOMS functional outcome measure was used to quantify this patient's level of swallowing impairment.  Based on the NOMS, the patient was determined to be at level 2 for swallow function.  NOMS Swallowing Levels:  Level 1 (CN): NPO  Level 2 (CM): NPO but takes consistency in therapy  Level 3 (CL): Takes less than 50% of nutrition p.o. and continues with nonoral feedings; and/or safe with mod cues; and/or max diet restriction  Level 4 (CK): Safe swallow but needs mod cues; and/or mod diet restriction; and/or still requires some nonoral feeding/supplements  Level 5 (CJ): Safe swallow with min diet restriction; and/or needs min cues  Level 6 (CI): Independent with p.o.; rare cues; usually self cues; may need to avoid some foods  or needs extra time  Level 7 [facility]): Independent for all p.o.  ASHA. (2003). National Outcomes Measurement System (NOMS): Adult Speech-Language Pathology User's Guide.       COMMUNICATION/EDUCATION:   The patient's plan of care including recommendations, planned interventions, and recommended diet changes were discussed with: Registered nurse    Patient/family have participated as able in goal setting and plan of care and Patient/family agree to work toward stated goals and plan of care    Thank you,  Heber Carolina, SLP  Minutes: 25     Problem: SLP Adult - Impaired Swallowing  Goal: By Discharge: Advance to least restrictive diet  without signs or symptoms of aspiration for planned discharge setting.  See evaluation for individualized goals.  Description: Speech Pathology Goals  1. Patient will participate in FEES to objectively assess swallow function prior to PO diet initiation. Goal initiated 06/22/22. Goal met 06/22/22.   2. Patient will tolerate therapeutic PO trials of thin liquids (e.g., ice chips, small sips of water) and purees without clinical s/s of aspiration or respiratory decline. Goal initiated 06/22/22.  3. Patient will complete pharyngeal strengthening exercises (e.g., effortful swallow) with min cues for technique. Goal initiated 06/22/22.   4. Patient will complete repeat instrumental swallow study to determine readiness for PO diet when clinically appropriate. Goal initiated 06/22/22.   06/22/2022 1516 by Heber Carolina, SLP  Outcome: Progressing  06/22/2022 1230 by Heber Carolina, SLP  Outcome: Progressing

## 2022-06-22 NOTE — Progress Notes (Signed)
Occupational Therapy    Chart reviewed and interventions attempted. Pt undergoing HD. Will continue to follow.

## 2022-06-22 NOTE — Other (Signed)
06/22/22 1355   Vital Signs   BP (!) 151/88   Temp 97.6 F (36.4 C)   Pulse 83   Respirations 18   SpO2 95 %   Weight - Scale 108.5 kg (239 lb 3.2 oz)   Weight Method Bed scale   Percent Weight Change 3.63   Pain Assessment   Pain Assessment None - Denies Pain   Treatment   Time On 1355   Treatment Goal 2000   Observations & Evaluations   Level of Consciousness 1   Oriented X 4   Heart Rhythm Regular   Respiratory Quality/Effort Unlabored   O2 Device None (Room air)   Bilateral Breath Sounds Clear   Skin Color Pale   Skin Condition/Temp Dry;Warm   Edema Generalized   Edema Generalized +2   RUE Edema +2   LUE Edema +2   RLE Edema +2   LLE Edema +2   Facial Edema +1   Periorbital Edema Trace   Technical Checks   Dialysis Machine No. 08   RO Machine Number [id number]   Dialyzer Lot No. [id number]   Tubing Lot Number [id number]   All Connections Secure Yes   NS Bag Yes   Saline Line Double Clamped Yes   Dialyzer Revaclear 300   Prime Volume (mL) 200 mL   ICEBOAT I;C;E;B;O;A;T  (consent on chart)   RO Machine Log Sheet Completed Yes   Machine Alarm Self Test Completed;Passed   Child psychotherapist Function   Extracorporeal Chemical engineer Conductivity 14   Manual Ph 7.2   Bleach Test (Neg) Yes   Bath Temperature 98.6 F (37 C)   Treatment Initiation   Dialyze Hours 3   Treatment  Initiation Universal Precautions maintained;Lines secured to patient;Connections secured;Prime given;Venous Parameters set;Arterial Parameters set;Air foam detector engaged;Dialysate;Saline line double clamped;Dialyzer;Revaclear Dialyzer;REV-300   Dialysis Bath   K+ (Potassium) 3   Ca+ (Calcium) 2.5   Na+ (Sodium) 140   HCO3 (Bicarb) 35   Bicarbonate Concentrate Lot No. [id number]   Acid Concentrate Lot No. [id number]   Hemodialysis Central Access Right Neck   Placement Date/Time: 06/09/22 1830   Present on Admission/Arrival: No  Inserted by: dr. Rutherford Limerick  Orientation: Right  Access Location: Neck    Continued need for line? Yes   Site Assessment Clean, dry & intact   Venous Lumen Status Brisk blood return;Flushed;Infusing   Arterial Lumen Status Blood return noted;Flushed;Infusing   Alcohol Cap Used Yes   Line Care Connections checked and tightened;Ports disinfected   Dressing Type Bacteriocidal;Transparent   Date of Last Dressing Change 06/19/22   Dressing Status New dressing applied;Clean, dry & intact   Dressing Intervention Dressing changed   Primary RN SBAR: Ashley Jacobs, RN  Patient Education: discussed tx and UF.  Also discussed hypotensive s/sx  Hepatitis B Surface Ag   Date/Time Value Ref Range Status   06/01/2022 10:08 PM <0.10 Index Final     Hep B S Ab   Date/Time Value Ref Range Status   06/01/2022 10:08 PM <3.10 mIU/mL Final

## 2022-06-22 NOTE — Plan of Care (Signed)
Speech LAnguage Pathology EVALUATION    Patient: Jaime [name] 76 y.o. male)  Date: 06/22/2022  Primary Diagnosis: Septicemia ([facility]) [A41.9]  Gastric perforation ([facility]) [K25.5]  Perforated abdominal viscus [R19.8]  Procedure(s) (LRB):  LAPAROTOMY EXPLORATORY (N/A)  LAPAROSCOPY DIAGNOSTIC (N/A)  ENDOSCOPY OF ILEAL CONDUIT (N/A) 21 Days Post-Op     Precautions: Aspiration, Fall Risk     ASSESSMENT :  Patient known to this service following initial intubation and completed an initial FEES 8/11 following his first extubation. SLP at that time recommended NPO due to findings of aspiration of thin liquids and purees on FEES. Patient then re-intubated 8/14-8/19. Clinical swallow re-evaluation now ordered following second extubation. PO trials of thin liquids given. Vocal quality perceived to be [facility]. Oral phase of swallow appeared functional for this consistency. Pharyngeal phase of swallow revealed intermittent cough response which may indicate aspiration. Patient will benefit from repeat FEES to objectively assess swallow safety/efficiency. Continue NPO with DHT pending FEES findings.    Patient will benefit from skilled intervention to address the above impairments.     PLAN :  Recommendations and Planned Interventions:  Diet: NPO and ice chips  Continue temporary means of nutrition/hydration/medications  Oral care 3-4x/daily while NPO    Acute SLP Services: Yes, patient will be followed by speech-language pathology 3x/week to address goals. Patient's rehabilitation potential is considered to be Fair.    Discharge Recommendations: Yes, recommend SLP treatment at next level of care     SUBJECTIVE:   Patient stated, "Let's get it over with" when offered FEES today vs. tomorrow.    OBJECTIVE:   History reviewed. No pertinent past medical history.  Past Surgical History:   Procedure Laterality Date    BLADDER SURGERY N/A 06/01/2022    ENDOSCOPY OF ILEAL CONDUIT performed by Guinevere Ferrari, MD at [facility]    CT VISCERAL  PERCUTANEOUS DRAIN  06/05/2022    CT VISCERAL PERCUTANEOUS DRAIN 06/05/2022 [facility] RAD CT    LAPAROSCOPY N/A 06/01/2022    LAPAROSCOPY DIAGNOSTIC performed by Guinevere Ferrari, MD at [facility]    LAPAROTOMY N/A 06/01/2022    LAPAROTOMY EXPLORATORY performed by Guinevere Ferrari, MD at [facility]     Prior Level of Function/Home Situation:   Baseline Diet: Regular texture diet with thin liquids    Baseline Assessment:  Current Diet : NPO  Prior Dysphagia History: Patient denies prior history of dysphagia     Cognitive and Communication Status:  Neurologic State: Alert  Orientation Level: Oriented x4  Cognition: Appropriate for age attention/concentration and Follows commands    Dysphagia:  Oral Assessment:  Labial: No impairment  Dentition: Natural;Limited  Oral Hygiene: Dried secretions  Lingual: Atrophy, ?Bruising  Velum: No Impairment  Mandible: No impairment    P.O. Trials:  Assessment Method(s): Observation  Patient Position: Upright in bed  Vocal Quality: No Impairment  Consistency Presented: Ice Chips;Thin  How Presented: SLP-fed/Presented;Cup/sip;Straw;Successive Swallows;Spoon  Bolus Acceptance: No impairment  Bolus Formation/Control: No impairment  Propulsion: No impairment  Oral Residue: None  Initiation of Swallow: Present  Laryngeal Elevation: Present  Aspiration Signs/Symptoms: Strong cough  Pharyngeal Phase Characteristics: No impairment, issues, or problems    Respiratory Status/Airway:  Room air    The NOMS functional outcome measure was used to quantify this patient's level of swallowing impairment.  Based on the NOMS, the patient was determined to be at level 2 for swallow function.  NOMS Swallowing Levels:  Level 1 (CN): NPO  Level 2 (CM): NPO but  takes consistency in therapy  Level 3 (CL): Takes less than 50% of nutrition p.o. and continues with nonoral feedings; and/or safe with mod cues; and/or max diet restriction  Level 4 (CK): Safe swallow but needs mod cues; and/or mod diet restriction; and/or still  requires some nonoral feeding/supplements  Level 5 (CJ): Safe swallow with min diet restriction; and/or needs min cues  Level 6 (CI): Independent with p.o.; rare cues; usually self cues; may need to avoid some foods or needs extra time  Level 7 (CH): Independent for all p.o.  ASHA. (2003). National Outcomes Measurement System (NOMS): Adult Speech-Language Pathology User's Guide.     After treatment:   Patient left in no apparent distress in bed, Call bell left within reach, and Nursing notified    COMMUNICATION/EDUCATION:   The patient's plan of care including recommendations, planned interventions, and recommended FEES were discussed with: Registered nurse    Patient/family have participated as able in goal setting and plan of care and Patient/family agree to work toward stated goals and plan of care    Thank you,  Margo Aye, SLP  Minutes: 20     Problem: SLP Adult - Impaired Swallowing  Goal: By Discharge: Advance to least restrictive diet without signs or symptoms of aspiration for planned discharge setting.  See evaluation for individualized goals.  Description: Speech Pathology Goals  1. Patient will participate in FEES to objectively assess swallow function prior to PO diet initiation. Goal initiated 06/22/22.   Outcome: Progressing

## 2022-06-22 NOTE — Other (Addendum)
Jaime [name]  [facility]  DIABETES MANAGEMENT CONSULT    Consulted by Provider for advanced nursing evaluation and care for inpatient blood glucose management.    Evaluation and Action Plan   This 76 year old Caucasian male was admitted with RUQ pain and underwent laparotomy 06/01/22. Was in septic shock with multi-organ failure. Several episodes of Vtach. Remained on vent support, pressors & CRRT. A new finding of liver abscess noted on CT; drain placed with diminishing output. There is concern for LLE viability due to diminished pedal pulses. Ischemia of first 2 toes bilaterally. Per vascular, he has not been a candidate for endovascular or surgical revascularization at this time. Moved out of ICU but experienced an acute decline (hypotension, arrhythmia & AMS) 06/14/22 & was returned to ICU. Had to be re-intubated but is now off sedation & pressor support. Infectious disease continues to follow & remains on multiple antibiotics. Feeding by Dobhoff. CRRT on hold per nephrology.    As for BG management in this patient with known Type 2 diabetes, began low dose basal insulin 06/02/22 along with corrective insulin to address impact of steroid. Bgs had been 170-180s but continued to rise. Insulin adjusted on 06/05/22. HC stopped 06/07/22. Bgs subsequently in target. High residuals resulted in TF being stopped; hence, did not require any insulin 06/11/22. Has not passed swallow test again 06/15/22. TF restarted at 10cc/hr. Bgs in 120-170s. Corrective approach was in use but TF tolerated @ goal rate. Bgs in the 180-220s so insulin dosing adjusted. Bgs now in target; no change indicated. Swallow evaluation today so nutrition source may change this week.     Management Rationale Action Plan   Medication   TF Running at goal rate of 30cc/hr (115 CHO/D)   Continue Lantus insulin 16 units D    Switch to LOW corrective approach     Additional orders               Initial Presentation   Jaime [name] is a 76 y.o. male  admitted 05/31/22 from ER after experiencing RUQ pain associated with generalized weakness, nausea & vomiting for several days PTA. AMS+. Hypotension+. Fever+. Dyspneic+. He is visiting from [place].  Ectopic atrial tachycardia  ER exam:  Cardiovascular:      Rate and Rhythm: Regular rhythm. Tachycardia present.   Pulmonary:      Comments: He is tachypneic, coarse breath sounds on inspiration at bilateral bases  Abdominal:      Palpations: Abdomen is soft.      Comments: Tender to palpation diffusely in the right sided abdomen without guarding or rebound tenderness   LAB: WBC 16.6. Normal H&H. Low platelets.BG 272/AG 17. AKI. Elevated liver enzymes. NT pro-BNP 20,980. Lipase >3000.     CXR:   Right IJ catheter satisfactory position without pneumothorax. ET   tube and NG tube as above   CT Head: Negative  CT ABd:   Extraluminal air bubbles in the right upper quadrant concerning for gastric   perforation. Inflammatory changes are noted about the tail of the pancreas   extending into the left anterior pararenal space, please correlate with any   evidence of acute pancreatitis. Bilateral lower lobe infiltrates.     BJ:YNWGNFA reviewed. No pertinent past medical history.     INITIAL DX: Septicemia (HCC) [A41.9]  Gastric perforation (HCC) [K25.5]  Perforated abdominal viscus [R19.8]     Current Treatment     TX: 06/01/22 LAPAROTOMY EXPLORATORY  LAPAROSCOPY DIAGNOSTIC ENDOSCOPY OF ILEAL  CONDUIT  EGD    Hospital Course   Clinical progress has been complicated by multi-organ failure.   06/01/22 Dialysis  06/02/22 In septic shock with multi-organ failure. On vent support, sedation, pressor support and bicarb infusion. On Abx. Two runs of VT this morning requiring defibrillation; cardiology conversation anticipated. CRRT+  06/03/22 Remains on vent support C vent Fi02 50%/Peep 7. Low grade fever; on Abx. Steroids+. Afib/bradycardic. Pressors+. CRRT+.   06/04/22 Remains on vent support and pressors. On Abx. Concern for LLE. CRRT.  CT: Enlarged left hepatic abscess contain gas with other areas of probable abscess/phlegmonous infection.  06/05/22 On AC vent support. On Amio, vaso, levo & Fentanyl infusions. OG draining green fluid. CRRT+.  Vascular: Patient remains at risk for progressive deterioration and limb loss but needs to be weaned from pressor support before intervention is safe.  06/08/22 On Spon vent support; plans to extubate after CT today. Fentanyl pushes. H&H low. CRRT+. Mottled & blue toes bilaterally. Vascular AKI study to be completed  06/09/22 Alert. 02NC. Amio infusion+. Pedal pulses returning (no need to use doppler)  06/10/22 Alert. 02NC. NSR. Off pressors. TF running. Swallow eval today => may begin CL. Undergoing hemodialysis now. Continued cyanosis of bilateral toes.  06/11/22 Alert & conversational today  06/12/22 A&O. Failed swallow; allowed ice chips. Podiatry to see for LE ischemia  06/15/22 Intubated & sedated on pressor support. TF restarted at trickle rate. Dialysis planned per nephrology.  06/16/22 Eyes open. Intubated & sedated on Precedex. ST. Hypotensive on epi infusion. Amio+. TF running @10cc /hr. CVVH+.   06/17/22 Eyes closed. Intubated & sedated on Precedex. Spon vent Fi02 50/Peep 5; trying to wean. NSR. Amio +. TF @goal  rate. CVVH+. Wound care involved  06/18/22 Eyes open. Following conversation. Raising left arm. Intubated on spon vent support & mildly sedated; will try again to wean from vent. TF running at goal rate. May get trached. Blood transfusion+  06/19/22 On sp vent support. Concerning trach. Off pressor. Dobhoff with TF. Anuric. HD today  06/22/22. A&O. Off vent & pressor support. TF infusing without issue via Dobhoff. Speech swallow evaluation today. 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 Case management continues to pursue [facility] placement in [place] where he lives    Diabetes History   Type 2 diabetes on metformin  therapy  Checks Bgs and they are in the 100s  Is physically active    Diabetes-related Medical History deferred    Diabetes Medication History - based on PTA list  Drug class Currently in use   Biguanide [x]  Metformin (Glucophage)  []  Metformin ER (Glucophage XL)     Diabetes self-management practices: deferred  Overall evaluation:    [x]  Unknown A1c     Subjective   Normal conversation     Objective   Physical exam  General Obese male who is in no acute distress  Neuro  Alert & oriented  Vital Signs   Vitals:    06/22/22 0830   BP:    Pulse: 81   Resp: 15   Temp:    SpO2: 94%     Extremities    Diabetic foot exam:    Left Foot     Visual Exam: Cool & dry. Thickened toenails. 3 toes gangrenous    Pulse DP: +1   Right Foot   Visual Exam: Cool & dry. Thickened toenails . 4 toes gangrenous   Pulse DP: +1  Laboratory  Recent Labs     06/20/22  0241 06/21/22  0343 06/22/22  0231   WBC 15.7* 19.0* 17.8*   HGB 7.8* 8.4* 8.1*   HCT 25.7* 28.0* 26.0*   MCV 103.6* 102.9* 102.4*   PLT 152 153 163       Recent Labs     06/20/22  0241 06/21/22  0343 06/22/22  0231   NA 136 134* 133*   K 3.9 3.7 3.7   CL 106 101 101   CO2 24 26 25    PHOS 3.6  --   --    BUN 60* 53* 76*   CREATININE 3.30* 2.87* 3.87*       Lab Results   Component Value Date    ALT 31 06/22/2022    AST 49 (H) 06/22/2022    ALKPHOS 216 (H) 06/22/2022    BILITOT 1.7 (H) 06/22/2022     No results found for: TSH, TSHREFLEX, TSHFT4, TSHELE, TSH3GEN, TSHHS   Lab Results   Component Value Date    LABA1C 7.7 (H) 06/02/2022     Factors impacting BG management  Factor Dose Comments   Nutrition:  TF   @target  rate of 30cc/hr  (115 CHO/D)   Running goal rate   Drug PRBC 06/18/22    Pain Fent PRN    Infection Cefepime Q24 hrs  Flagyl Q12 hrs  Diflucan Q48 hrs WBC abit lower   Other:   Kidney function  Liver function   AKI  Liver enzymes elevated   HD anticipated today     Blood glucose pattern      Significant diabetes-related events  05/31/22 Admission BG 233  06/01/22 Dex  4mg  => BG 100s => HC started  06/02/22 Bgs into 200s  06/03/22 Bgs in 170-180s  06/04/22 Bgs rising abit. HC reduced frequency today. Trickle feeds to begin  06/05/22 Bgs rising into 200s  06/08/22 Bgs in target  06/09/22 Bgs rising with start of TF at higher rate  06/10/22 Bgs rose abit with TF @30cc /hr  06/11/22 Bgs 108, 113. No insulin given  06/12/22 No insulin given. No nutrition. AKI  06/14/22 Acute decline in health status after dialysis => back to ICU  06/15/22 TF restarted  06/16/22 TF still at 10cc/hr  06/17/22 TF at goal rate. Bgs 190s  06/18/22 Tfs running at goal rate. FBG 183  06/19/22 FBG 170 but higher during the day  06/22/22 Bgs 130-170s    Assessment and Nursing Intervention   Nursing Diagnosis Risk for unstable blood glucose pattern   Nursing Intervention Domain 5250 Decision-making Support   Nursing Interventions Examined current inpatient diabetes/blood glucose control   Explored factors facilitating and impeding inpatient management  Explored corrective strategies with patient and responsible inpatient provider   Informed patient of rational for insulin strategy while hospitalized     Billing Code(s)   []  99233 IP subsequent hospital care - 50 minutes   []  99232 IP subsequent hospital care - 35 minutes   [x]  99231 IP subsequent hospital care - 25 minutes   []  99221 IP initial hospital care - 40 minutes     Before making these care recommendations, I personally reviewed the hospitalization record, including notes, laboratory & diagnostic data and current medications, and examined the patient at the bedside (circumstances permitting) before determining care. More than fifty (50) percent of the time was spent in patient counseling and/or care coordination.  Total minutes: 25    06/20/22, APRN - CNS  Clinical  Nurse Specialist - Diabetes & endocrine disorders  [facility]  Access via [employer]

## 2022-06-22 NOTE — Progress Notes (Signed)
Palliative Medicine  Jaime [name] is a 76 y.o. with a past history of HTN, HLD, and DM2, who was admitted on 05/31/2022 while traveling in [place] for Nascar race, with a diagnosis of Perforated Viscus seen on CT, Septic Shock, and AKI.     Code Status: Full Code     Advance Care Planning:  Demographics 06/08/2022   Marital Status Married    No AMD on file. Spouse is legal nok.     Patient / Family Encounter Documentation     Participants (names): spouse Jaime [name], (by phone), Jaime [name], Jaime Leatherwood, LCSW     Narrative: LCSW checked in on patient, who was alert and oriented, breathing comfortably on room air, in no apparent distress. He is able to speak in a soft voice now that breathing tube is out. consulted with CM Jaime [name], assigned nurse Jaime [name], and called Jaime [name]. She shared that they had 2 friends that accompanied her Saturday to visit patient and encourage him to get better so he can come home. She plans to go to [facility] in [place] this morning to begin working with them on mobilizing support services and resources for his return. She expressed understanding that he will need SNF rehabilitation and may be on dialysis "for the rest of his life," so she is exploring resources for dialysis and transportation in [place] area.     Palliative SW will continue to follow to offer support to patient and family during his hospitalization.     Psychosocial Issues Identified/ Resilience Factors:   Patient is a veteran of the [name]. He is retired now and has VA benefits, but a high co-pay for his medication due to his income.  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 His wife uses O2 and has a Designer, jewellery. She ambulates in a wheelchair. She is buying groceries for Jaime [name] and paying bills while patient is hospitalized.     Patient and spouse celebrated 98 years of marriage on August 20. They usually go out to eat on their anniversary, but also they don't wait for special  occasions to go out. His best friend is Financial planner. They travel to about 8 Nascar races a year, and Jaime [name] was with patient on his trip to [place] and urged him to go to the hospital, for which patient is grateful.     Caregiver Burden: High  Does the caregiver feel confident administering medication? Not discussed.  Does the caregiver need any help connecting with community resources? Yes  Does the caregiver feel confident assisting with activities of daily living? No.     Goals of Care / Plan:   Patient's symptoms will be managed.  Patient wants to transfer as soon as possible to a ([facility]) hospital in [place], [place].  Spouse now has his wedding ring, which was tight on his finger and removed by ICU nurse.  Spouse is working with local [place] [facility] and [facility] CM Jaime [name] on goal of transferring patient to [place] area for next steps in his care, including resources for dialysis and transportation.  Patient will be offered the opportunity to complete an AMD if he is interested and able during this hospitalization.  Palliative team will continue to provide education and support as appropriate to patient and family.     Thank you for including Palliative Medicine in the care of Mr. Jaime [name].     Jaime Leatherwood, LCSW  [phone])

## 2022-06-22 NOTE — Progress Notes (Addendum)
1610- Bedside, Verbal, and Written shift change report given to [facility] RN & Jill Side RN (oncoming nurse) by Melina Copa (offgoing nurse). Report included the following information SBAR, Kardex, ED Summary, OR Summary, Intake/Output, MAR, and Cardiac Rhythm NSR .      0800- Shift assessment performed, see flowsheets.    0900- Dr. Cordie Grice at bedside.    9604- Critical care interdisciplinary rounds today.  Following members present: Case Management, Diabetes Treatment, Nursing, Nutrition, Pharmacy, and Physician.     1130- Wound Care at bedside for woundvac change.    1200- Reassessment performed, see flowsheets.    1215- Speech/Language pathologist at bedside.    1300- HD nurse at bedside.    1315Liborio Nixon NP from IR called to discuss liver abscess/ drain. Plans to keep drain but increase size due to liver abscess imaging results.    1340- Cheryl w/ IR called to discuss plans for larger drain; plans to place tomorrow AM.    1600- Reassessment performed, see flowsheets.    1910- Bedside, Verbal, and Written shift change report given to Sarah RN (oncoming nurse) by Trula Ore RN & Jill Side RN (offgoing nurse). Report included the following information SBAR, Kardex, ED Summary, OR Summary, Intake/Output, MAR, and Cardiac Rhythm NSR .      Gus Puma, RN

## 2022-06-22 NOTE — Progress Notes (Signed)
Asked to see patient to obtain IV access to remove IJ.  Assessed both arms with Ultrasound.  Veins were either too small or would not compress.  Patient also very edematous.  Discussed finding with primary nurse.        Sherryle Lis, RN, BSN, [facility]-BC  Vascular Access / PICC Team

## 2022-06-22 NOTE — Progress Notes (Signed)
Hospitalist Progress Note    NAME:   Jaime [name]   DOB: 26-Aug-1946   MRN: [id number]     Date/Time: 06/22/2022 4:13 PM  Patient PCP: No primary care provider on file.      Assessment / Plan:  Septic shock  D/t perforated viscus  Liver abscess  S/p Laparotomy 7/31:    General surgery and ID following  Follow up cultures to finalize, Culture grew E coli  Continue with Cefepime, flagyl, Fluconazole  Wound care following    Acute hypoxemic respiratory failure:  Reintubated 8/13, extubated on 8/19  Currently on 2 liters [place]    Acute kidney injury:  Now on HD  Continue with HD  Nephrology following    Bilateral lower extremity critical limb ischemia:  Gangrene both feet:    Podiatry and vascular surgery following    Diabetes mellitus:  Sliding scale and lantus  Monitor FS    Medical Decision Making:   I personally reviewed labs: CBC, BMP  I personally reviewed imaging: CT abdomen  I personally reviewed EKG: None  Toxic drug monitoring: Monitor creatinine with abx  Discussed case with: INtensivist    Code Status: Full  DVT Prophylaxis: heparin  GI Prophylaxis:    Subjective:     Chief Complaint / Reason for Physician Visit  Seen and evaluated in ICU for Septic shock  Getting HD during my evaluation  Denies any acute pain, bilateral toes are gangrenous      Objective:     VITALS:   Last 24hrs VS reviewed since prior progress note. Most recent are:  Patient Vitals for the past 24 hrs:   BP Temp Temp src Pulse Resp SpO2 Weight   06/22/22 1545 123/64 -- -- 88 -- -- --   06/22/22 1530 128/62 -- -- 87 -- -- --   06/22/22 1515 126/60 -- -- 85 -- -- --   06/22/22 1500 123/67 -- -- 94 28 95 % --   06/22/22 1445 (!) 142/62 -- -- 87 17 95 % --   06/22/22 1430 (!) 143/66 -- -- 83 18 95 % --   06/22/22 1415 (!) 152/62 -- -- 86 18 94 % --   06/22/22 1355 (!) 151/88 97.6 F (36.4 C) -- 83 18 95 % 108.5 kg (239 lb 3.2 oz)   06/22/22 1345 (!) 161/71 -- -- 86 20 94 % --   06/22/22 1330 (!) 158/68 -- -- 87 23 93 % --   06/22/22 1315  -- -- -- 84 19 93 % --   06/22/22 1300 (!) 156/60 -- -- 85 21 94 % --   06/22/22 1245 -- -- -- 81 18 94 % --   06/22/22 1230 -- -- -- 85 19 95 % --   06/22/22 1215 -- -- -- 83 16 94 % --   06/22/22 1200 (!) 149/56 97.7 F (36.5 C) Axillary 82 17 93 % --   06/22/22 1145 -- -- -- 83 21 94 % --   06/22/22 1130 -- -- -- 84 22 95 % --   06/22/22 1115 -- -- -- 84 19 93 % --   06/22/22 1100 (!) 155/56 -- -- 85 21 93 % --   06/22/22 1045 -- -- -- 86 19 94 % --   06/22/22 1030 -- -- -- 82 17 93 % --   06/22/22 1015 -- -- -- 84 29 93 % --   06/22/22 1000 (!) 162/58 -- -- 89 18 93 % --   06/22/22  0945 -- -- -- 83 15 93 % --   06/22/22 0930 -- -- -- 81 15 93 % --   06/22/22 0915 -- -- -- 83 19 94 % --   06/22/22 0900 (!) 154/56 -- -- 82 16 92 % --   06/22/22 0845 -- -- -- 82 16 94 % --   06/22/22 0830 -- -- -- 81 15 94 % --   06/22/22 0815 -- -- -- 79 14 94 % --   06/22/22 0800 (!) 142/55 97.6 F (36.4 C) Axillary 82 16 93 % --   06/22/22 0745 -- -- -- 84 20 94 % --   06/22/22 0730 -- -- -- 85 19 94 % --   06/22/22 0715 -- -- -- 83 16 94 % --   06/22/22 0700 (!) 137/57 -- -- 86 19 94 % --   06/22/22 0600 (!) 140/53 -- -- 85 19 91 % --   06/22/22 0545 -- -- -- -- -- -- 104.7 kg (230 lb 13.2 oz)   06/22/22 0500 (!) 135/56 -- -- 81 17 92 % --   06/22/22 0400 (!) 131/54 -- -- 77 16 92 % --   06/22/22 0300 (!) 118/51 -- -- 81 15 91 % --   06/22/22 0200 (!) 130/52 -- -- 83 17 90 % --   06/22/22 0100 (!) 132/50 -- -- 84 17 91 % --   06/22/22 0000 (!) 148/64 97.9 F (36.6 C) Oral 83 18 92 % --   06/21/22 2300 (!) 149/57 -- -- 84 15 93 % --   06/21/22 2200 (!) 148/51 -- -- 84 15 92 % --   06/21/22 2100 132/60 -- -- 89 20 91 % --   06/21/22 2000 (!) 149/57 97.7 F (36.5 C) Oral 87 19 92 % --   06/21/22 1900 (!) 145/64 -- -- 85 19 92 % --   06/21/22 1800 (!) 148/62 -- -- 86 23 91 % --   06/21/22 1700 -- -- -- 90 25 92 % --         Intake/Output Summary (Last 24 hours) at 06/22/2022 1613  Last data filed at 06/22/2022 1500  Gross per 24  hour   Intake 910 ml   Output 262 ml   Net 648 ml        I had a face to face encounter and independently examined this patient on 06/22/2022, as outlined below:  PHYSICAL EXAM:  General: Alert, cooperative  EENT:  EOMI. Anicteric sclerae.  Resp:  CTA bilaterally, no wheezing or rales.  No accessory muscle use  CV:  Regular  rhythm,  No edema  GI:  Soft, Non distended, Non tender.  +Bowel sounds  Neurologic:  Alert and oriented X 3, normal speech,   Psych:   Good insight. Not anxious nor agitated  Skin:  B/L Toe gangrene    Reviewed most current lab test results and cultures  YES  Reviewed most current radiology test results   YES  Review and summation of old records today    NO  Reviewed patient's current orders and MAR    YES  PMH/SH reviewed - no change compared to H&P  ________________________________________________________________________  Care Plan discussed with:    Comments   Patient x    Family      RN x    Care Manager     Consultant  Multidiciplinary team rounds were held today with case manager, nursing, pharmacist and clinical coordinator.  Patient's plan of care was discussed; medications were reviewed and discharge planning was addressed.     ________________________________________________________________________  Total NON critical care TIME:  55  Minutes    Total CRITICAL CARE TIME Spent:   Minutes non procedure based      Comments   >50% of visit spent in counseling and coordination of care     ________________________________________________________________________  Loman Chroman, MD     Procedures: see electronic medical records for all procedures/Xrays and details which were not copied into this note but were reviewed prior to creation of Plan.      LABS:  I reviewed today's most current labs and imaging studies.  Pertinent labs include:  Recent Labs     06/20/22  0241 06/21/22  0343 06/22/22  0231   WBC 15.7* 19.0* 17.8*   HGB 7.8* 8.4* 8.1*   HCT 25.7* 28.0* 26.0*    PLT 152 153 163     Recent Labs     06/20/22  0241 06/21/22  0343 06/22/22  0231   NA 136 134* 133*   K 3.9 3.7 3.7   CL 106 101 101   CO2 24 26 25    GLUCOSE 192* 153* 152*   BUN 60* 53* 76*   CREATININE 3.30* 2.87* 3.87*   CALCIUM 8.0* 7.9* 8.1*   MG 2.3  --   --    PHOS 3.6  --   --    LABALBU  --   --  1.6*   BILITOT  --   --  1.7*   AST  --   --  49*   ALT  --   --  31       Signed: , MD

## 2022-06-22 NOTE — Other (Signed)
06/22/22 1655   Vital Signs   BP 118/63   Temp 97.4 F (36.3 C)   Pulse 91   Respirations 19   SpO2 95 %   Weight - Scale 106.5 kg (234 lb 12.6 oz)   Weight Method Estimated   Percent Weight Change -1.84   Pain Assessment   Pain Assessment None - Denies Pain   Post-Hemodialysis Assessment   Post-Treatment Procedures Blood returned;Catheter capped, clamped and heparinized x 2 ports   Machine Disinfection Process Acid/Vinegar Clean;Bleach;Machine Absence of [employer];Exterior Engineer, agricultural Volume (ml) 300 ml   Blood Volume Processed (Liters) 53 l/min   Dialyzer Clearance Clear   Duration of Treatment (minutes) 180 minutes   Heparin Amount Administered During Treatment (mL) 0 mL   Hemodialysis Intake (ml) 500 ml   Hemodialysis Output (ml) 2500 ml   NET Removed (ml) 2000   Tolerated Treatment Good   Patient Response to Treatment well   Bilateral Breath Sounds Clear   Edema Generalized   Edema Generalized +2   RUE Edema +2   LUE Edema +2   RLE Edema +2   LLE Edema +2   Facial Edema +1   Time Off 1655   Patient Disposition Remain in ICU/ED     Primary RN SBAR: Gus Puma, RN  Comments: Pt is especially helpful and positive!

## 2022-06-22 NOTE — Progress Notes (Signed)
NAME: Jaime [name]        DOB:  12/23/1945        MRN:  [id number]                     Assessment   :                                               Plan:  AKI  Pancreatitis  Hyponatremia  DM  anemia Plan for HD today.  DW [facility].    H/H not at goal.  Start ESA.    DW RN            Subjective:     Chief Complaint:  Extubated.  No complaints.    Review of Systems:    Symptom Y/N Comments  Symptom Y/N Comments   Fever/Chills    Chest Pain     Poor Appetite    Edema     Cough    Abdominal Pain     Sputum    Joint Pain     SOB/DOE    Pruritis/Rash     Nausea/vomit    Tolerating PT/OT     Diarrhea    Tolerating Diet     Constipation    Other       Could not obtain due to:      Objective:     VITALS:   Last 24hrs VS reviewed since prior progress note. Most recent are:  Vitals:    06/22/22 0830   BP:    Pulse: 81   Resp: 15   Temp:    SpO2: 94%       Intake/Output Summary (Last 24 hours) at 06/22/2022 1021  Last data filed at 06/22/2022 0600  Gross per 24 hour   Intake 950 ml   Output 332 ml   Net 618 ml      Telemetry Reviewed:     PHYSICAL EXAM:  General: NAD  1+ edema      Lab Data Reviewed: (see below)    Medications Reviewed: (see below)    PMH/SH reviewed - no change compared to H&P  ________________________________________________________________________  Care Plan discussed with:  Patient     Family      RN     Care Manager                    Consultant:          Comments   >50% of visit spent in counseling and coordination of care       ________________________________________________________________________  Marina Gravel, MD     Procedures: see electronic medical records for all procedures/Xrays and details which  were not copied into this note but were reviewed prior to creation of Plan.      LABS:  Recent Labs     06/21/22  0343 06/22/22  0231   WBC 19.0* 17.8*   HGB 8.4* 8.1*   HCT 28.0* 26.0*   PLT 153 163     Recent Labs      06/20/22  0241 06/21/22  0343 06/22/22  0231   NA 136 134* 133*   K 3.9 3.7 3.7   CL 106 101 101   CO2 24 26 25    BUN 60* 53* 76*   MG 2.3  --   --  PHOS 3.6  --   --      Recent Labs     06/22/22  0231   GLOB 4.5*     No results for input(s): INR, APTT in the last 72 hours.    Invalid input(s): PTP   No results for input(s): TIBC, FERR in the last 72 hours.    Invalid input(s): FE, PSAT   No results found for: FOL, RBCF   No results for input(s): PH, PCO2, PO2 in the last 72 hours.  No results for input(s): CPK, CKMB, TROPONINI in the last 72 hours.  No components found for: [facility]  @labua @    MEDICATIONS:  Current Facility-Administered Medications   Medication Dose Route Frequency    epoetin alfa-epbx (RETACRIT) injection 6,000 Units  6,000 Units SubCUTAneous Once per day on Mon Wed Fri    amiodarone (CORDARONE) tablet 200 mg  200 mg Per NG tube Daily    therapeutic multivitamin-minerals 1 tablet  1 tablet Per NG tube Daily    famotidine (PEPCID) tablet 10 mg  10 mg Per NG tube Daily    fentaNYL (SUBLIMAZE) injection 25 mcg  25 mcg IntraVENous Q1H PRN    heparin (porcine) injection 5,000 Units  5,000 Units SubCUTAneous BID    fluconazole (DIFLUCAN) in 0.9 % sodium chloride IVPB 200 mg  200 mg IntraVENous Q48H    oxyCODONE (ROXICODONE) immediate release tablet 5 mg  5 mg Oral Q4H PRN    insulin glargine (LANTUS) injection vial 16 Units  16 Units SubCUTAneous Nightly    midodrine (PROAMATINE) tablet 5 mg  5 mg Oral Daily PRN    insulin lispro (HUMALOG) injection vial 0-4 Units  0-4 Units SubCUTAneous 4 times per day    cefepime (MAXIPIME) 1,000 mg in sodium chloride 0.9 % 50 mL IVPB (mini-bag)  1,000 mg IntraVENous Q24H    metronidazole (FLAGYL) 500 mg in 0.9% NaCl 100 mL IVPB premix  500 mg IntraVENous Q12H    acidophilus probiotic capsule 1 capsule  1 capsule Oral Daily    alcohol 62% (NOZIN) nasal sanitizer 1 ampule  1 ampule Topical BID    bisacodyl (DULCOLAX) suppository 10 mg  10 mg Rectal Daily PRN     albumin human 25% IV solution 25 g  25 g IntraVENous PRN    heparin (porcine) injection 1,200 Units  1,200 Units IntraCATHeter PRN    And    heparin (porcine) injection 1,000 Units  1,000 Units IntraCATHeter PRN    sodium chloride 0.9 % bolus 100 mL  100 mL IntraVENous PRN    balsum peru-castor oil (VENELEX) ointment   Topical BID    glucose chewable tablet 16 g  4 tablet Oral PRN    dextrose bolus 10% 125 mL  125 mL IntraVENous PRN    Or    dextrose bolus 10% 250 mL  250 mL IntraVENous PRN    glucagon (rDNA) injection 1 mg  1 mg SubCUTAneous PRN    dextrose 10 % infusion   IntraVENous Continuous PRN    sodium chloride flush 0.9 % injection 5-40 mL  5-40 mL IntraVENous 2 times per day    sodium chloride flush 0.9 % injection 5-40 mL  5-40 mL IntraVENous PRN    0.9 % sodium chloride infusion   IntraVENous PRN    acetaminophen (TYLENOL) tablet 650 mg  650 mg Oral Q6H PRN    Or    acetaminophen (TYLENOL) suppository 650 mg  650 mg Rectal Q6H PRN  ondansetron (ZOFRAN) injection 4 mg  4 mg IntraVENous Q6H PRN

## 2022-06-22 NOTE — Progress Notes (Addendum)
1910: Bedside report received from [place], RN and [place], RN    2000: assessment completed. ROM completed, pt weak but participating.    2300: Pt called out asking to be sat up d/t back pain. Offered tylenol and to reposition. Turned on right side and tylenol given down dobhoff    0005: Pt still uncomfortable after tylenol and turning. Turned more on right side and additional pillow applied behind back for comfort.    0120: New IV tubing hung. Pt agreeable to turn; turned on left side.    5400: CHG bath completed. Central line dressings changed and dated. Wound care completed. Rectal tube irrigated, level at , consistency appears thicker. ROM exercises completed, pt able to participate a little more each time. Pt turned on left side    0700: Bedside report given to Trula Ore, RN and [place], Charity fundraiser

## 2022-06-22 NOTE — Care Coordination-Inpatient (Signed)
Transition of Care Plan:     RUR: 18%  Prior Level of Functioning:  Independent - lives with wife in [place]  Disposition: [facility] vs placement  If SNF or IPR: Date FOC offered:   Date FOC received:   Accepting facility:   Date authorization started with reference number:   Date authorization received and expires:   Follow up appointments: PCP/Specialist  DME needed: TBD  Transportation at discharge: family if appropriate  IM/IMM Medicare/Tricare letter given: will need 2nd IMM prior to discharge  Is patient a Veteran and connected with VA? yes              If yes, was [employer] transfer form completed and VA notified?   Caregiver Contact: Jonluke Cobbins wife [phone]  Discharge Caregiver contacted prior to discharge? Per patient when able  Care Conference needed?   Barriers to discharge: Medical stability, podiatry clearance, surgical clearance, nephrology clearance, has dobhoff,       Spoke with patient about pursuing [facility] closer to home - patient deferred CM to speak with Susie, wife - she gave consent to pursue placement at [facility]- CM updated [facility] via [place] at [phone]  she provided tx center info for [facility]  ph# [phone] - spoke with April  (two other extensions give are x [phone]) - patient needs a Means eligibility - call placed to number provided [phone] to Tad (other x [phone]) - Cm directed to online form 10-10 EZR that needs completion - CM spoke with patient and wife and wife provided email ([email or web address]) - she will review and see if she wishes to complete it - per April with tx center once this info is completed then CM staff are to call her to pursue tx to [facility] in [place].       Spoke with wife with back up for rehab options - barrier to discharges to rehab placement [facility] at this time is need for HD and stretcher transport and they do not have the funds to afford the transportation that far unless  patient can progress enough to travel by car - otherwise may need to pursue facility closer to [place] area until patient is able to transition via car to transition to rehab/ home with home health.  CM will continue to follow.  Ella Jubilee, MSW

## 2022-06-22 NOTE — Progress Notes (Addendum)
0729-Bedside shift change report given to Trula Ore, Charity fundraiser and Jill Side, Charity fundraiser (oncoming nurses) by Maralyn Sago, Museum/gallery conservator). Report included the following information SBAR, Kardex, ED Summary, Procedure Summary, Intake/Output, MAR, Recent Results, and Cardiac Rhythm -NSR .     0800-Shift assessment completed by Jill Side, RN-see flowsheet for detailed findings. Pt. is awake, alert, and oriented x4-able to make needs known. No c/o voiced-denies pain or discomfort. Respirations even and unlabored on RA.     0900-Dr. Keightley in to see pt-updated. Pt. to have HD tx today.    0945-Interdisciplinary team rounds were held 06/22/2022 with the following team members:Physician, Nursing, Care Manager, Diabetes Specialist, and the CCU Charge RN.    Plan of care discussed. See clinical pathway and/or care plan for interventions and desired outcomes.    Goals of the Day: Transfer to Stepdown unit, remove IJ if other access is obtained, SLP consult, and ice chips as approved by Bella Kennedy, NP.    0955-Writer called VAT and spoke with Evette Cristal, RN-asked to assess patient for an extended dwell IV in order to remove L IJ CVC-states will be by to see him.    1200-Reassessment completed by Jill Side, RN-no changes noted.    1600-Reassessment completed by Jill Side, RN-no changes noted.    1707-VAT in to see pt.-states unable to place any further IV access due to lack of venous access/edema-Perfect Serve message sent to Dr. Renford Dills.    1715-Dr. Adhikari responded to Perfect Serve message and states to place order to keep L IJ CVC at this time.    1915-Bedside shift change report given to Maralyn Sago, Charity fundraiser (oncoming nurse) by Trula Ore, RN and Jill Side, RN (offgoing nurses). Report included the following information SBAR, Kardex, ED Summary, Procedure Summary, Intake/Output, MAR, Recent Results, and Cardiac Rhythm -NSR .

## 2022-06-22 NOTE — Progress Notes (Signed)
Surgery NP Note    Wound assessed at bedside today with Byrd Hesselbach (wound care) and Dr. Driscilla Moats (ID).     Patient denies discomfort.     Wound mostly clean with some sporadic fibrinous slough. Fascia is intact and no drainage to suggest infection or fistula. Cultures remain negative.     Plan:  Wound care as per WOCN- Agree with veraflo vac now and then downgrade to traditional vac once the slough is cleaned up.     Pertaining to the accordion drain- this was placed by interventional radiology and they will direct when this can be removed. Surgery will not direct this aspect of the patient care.     Surgery to sign off. Reconsult as needed.     Charlton Haws, APRN - NP

## 2022-06-22 NOTE — Progress Notes (Signed)
[facility] PROGRESS NOTE.      Name: Jaime [name]   DOB: 1946-02-18   MRN: [id number]   Date: 06/22/2022      Chief Complaint   Patient presents with    Hypotension     Hx hypertension - BP is 70s/40s    Altered Mental Status    Nausea    Emesis     Reason for ICU Admission: respiratory failure     Hospital Course:     76 y/o male (visiting [place] from [place]) with PMHx significant of HTN, HLD and T2DM. Came in ED 7/30 noon for N/V/Abd pain and generalized weakess. Admitted for intra abdominal septic shock.  S/p ex lap that showed fluid collection in right upper quadrant.  Pt in the ICU 7/31-8/11.  Started on broad spectrum antibiotics.  BC grew E coli.  Being followed by ID. Hosp c/c/b episodes of V-tach, worsening AKI requiring CRRT later [facility] and liver abscess needing drai placement. Improved and downgraded to step down unit on 8/11.      8/13 - Found to be acutely hypotensive and unresponsive. Patient was transferred to ICU and intubated on 08/13.  8/14: pressor requirement improved. Remains intubated and sedated. Able to wean. CVVH initiated  8/15:  pressors and vent being tapered  08/16 - continues to be on mechanical ventilator.   08/17 - Follows command off sedation. On SBT but very debilitated.   08/18 - Follows command off sedation. On SBT but very debilitated.  08/19 RASS 0. CAM-ICU negative. Passed SBT. Extubated.  08/20 Tolerating extubation well. Comfortable on NC O2 2 LPM. No new complaints. Limited venous access - RN unable to place PIV    Subjective/ Objective:   Overnight events; none  This AM, is alert and awake and oriented. Denies any active abdominal pain, nausea or vomiting. Feels tired but better over last 2 days. He feels he is strong and can fight to get better. Stays anuric. No fever or chills.     ICU Problem List     # Acute Hypoxemic resp failure, multifactorial.   - Re intubated 8/13 to 8/19. Currently on 2L nasal cannula. Tolerating well  # Septic shock,  intraabdominal source. Resolved. Off pressors and good MAPs  - s/p Laparotomy 06/01/22  # Liver abscess s/p drain 06/05/22 and fluid grew E coli.   -currently on cefepime and diflucan  # Bilateral lower extremity critical limb ischemia, gangrene both feet. Podiatry and vascular following  # RRT dependant AKI, tolerating HD well    Plan     # NO changes made in overall plan today.   - continue Abx per ID, dialysis per nephrology  - Ok to challenge with clear liquids and advance diet as tolerated. Surgery following  - care coordinator working on transferring patient to [facility] hospital in [place] close to family. Plan discussed in MDR today, [facility] requesting patient to be more stable before they accept patient. Transportation might be potential barrier in this process. Please coordinate with care coordinator on floor.     Code status: full code  SUP: protonix  DVT prophylaxis: heparin  NOK wife     Care Plan discussed with: Other MDR    OK to transfer patient to intermediate level of care for close watch    CRITICAL CARE CONSULTANT NOTE  I had a face to face encounter with the patient, reviewed and interpreted patient data including clinical events, labs, images, vital signs, I/O's, and examined  patient.  I have discussed the case and the plan and management of the patient's care with the consulting services, the bedside nurses and the respiratory therapist.      NOTE OF PERSONAL INVOLVEMENT IN CARE   This patient has a high probability of imminent, clinically significant deterioration, which requires the highest level of preparedness to intervene urgently. I participated in the decision-making and personally managed or directed the management of the following life and organ supporting interventions that required my frequent assessment to treat or prevent imminent deterioration.    Yevonne Pax, MD   [facility]  [phone]  06/22/2022      Review of systems:     Review of Systems   Constitutional:  Positive  for fatigue. Negative for chills and fever.   HENT: Negative.     Eyes: Negative.    Respiratory:  Negative for cough and shortness of breath.    Cardiovascular:  Negative for chest pain.   Gastrointestinal:  Positive for abdominal pain. Negative for diarrhea, nausea and vomiting.   Genitourinary: Negative.    Neurological: Negative.    Psychiatric/Behavioral: Negative.          Objective:     Vital Signs:  BP (!) 137/57   Pulse 86   Temp 97.9 F (36.6 C) (Oral)   Resp 19   Ht 1.727 m (5\' 8" )   Wt 104.7 kg (230 lb 13.2 oz)   SpO2 94%   BMI 35.10 kg/m      Temp (24hrs), Avg:98.5 F (36.9 C), Min:97.7 F (36.5 C), Max:99.4 F (37.4 C)           Intake/Output:     Intake/Output Summary (Last 24 hours) at 06/22/2022 0717  Last data filed at 06/22/2022 0600  Gross per 24 hour   Intake 1040 ml   Output 552 ml   Net 488 ml     Physical Exam  Constitutional:       Appearance: He is ill-appearing.   Eyes:      Pupils: Pupils are equal, round, and reactive to light.   Cardiovascular:      Rate and Rhythm: Normal rate and regular rhythm.   Pulmonary:      Effort: Pulmonary effort is normal.      Breath sounds: Normal breath sounds.   Abdominal:      General: Abdomen is flat.      Palpations: Abdomen is soft.   Neurological:      Mental Status: He is oriented to person, place, and time.   Psychiatric:         Mood and Affect: Mood normal.         Behavior: Behavior normal.       Past Medical History:        has no past medical history on file.    Past Surgical History:      has a past surgical history that includes laparotomy (N/A, 06/01/2022); laparoscopy (N/A, 06/01/2022); Bladder surgery (N/A, 06/01/2022); and CT DRAINAGE VISCERAL PERCUTANEOUS (06/05/2022).      Home Medications:     Prior to Admission medications    Medication Sig Start Date End Date Taking? Authorizing Provider   metFORMIN (GLUCOPHAGE) 500 MG tablet Take 1 tablet by mouth daily  Patient not taking: Reported on 06/08/2022   Yes Historical Provider, MD    metoprolol (LOPRESSOR) 100 MG tablet Take 1 tablet by mouth daily   Yes Historical Provider, MD   hydrALAZINE (APRESOLINE) 25 MG  tablet Take 1 tablet by mouth 3 times daily  Patient not taking: Reported on 06/08/2022   Yes Historical Provider, MD   atorvastatin (LIPITOR) 40 MG tablet Take 1 tablet by mouth daily  Patient not taking: Reported on 06/08/2022   Yes Historical Provider, MD   losartan (COZAAR) 100 MG tablet Take 1 tablet by mouth daily  Patient not taking: Reported on 06/08/2022   Yes Historical Provider, MD   metFORMIN (GLUCOPHAGE-XR) 500 MG extended release tablet Take 1 tablet by mouth daily (with breakfast)  Patient not taking: Reported on 06/08/2022 03/20/22   Historical Provider, MD   hydroCHLOROthiazide (HYDRODIURIL) 25 MG tablet TAKE 1 TABLET BY MOUTH ONCE DAILY AS NEEDED  Patient not taking: Reported on 06/08/2022 03/17/22   Historical Provider, MD   metoprolol succinate (TOPROL XL) 100 MG extended release tablet TAKE 1 TABLET BY MOUTH ONCE DAILY WITH OR IMMEDIATELY FOLLOWING A MEAL 03/10/22   Historical Provider, MD         Allergies/Social/Family History:     Allergies   Allergen Reactions    Augmentin [Amoxicillin-Pot Clavulanate] Hives     Tolerated ceftriaxone 06/2022    Codeine      Unknown reaction      Social History     Tobacco Use    Smoking status: Never    Smokeless tobacco: Never   Substance Use Topics    Alcohol use: Not on file      No family history on file.    LABS AND  DATA:   Reviewed    Peak airway pressure:      Minute ventilation:

## 2022-06-22 NOTE — Progress Notes (Signed)
[facility]  [facility]    Progress Note    Date:06/22/2022       Room:2522/01  Patient Name:Jaime [name]     Date of Birth:1946-03-01     Age:76 y.o.    Subjective    Subjective   Pt seen at [facility] in the ICU. No new complaints. Per nursing, no acute events      Review of Systems  Unable to accurately obtain due to current medical status      Objective         Vitals Last 24 Hours:  TEMPERATURE:  Temp  Avg: 97.6 F (36.4 C)  Min: 97.2 F (36.2 C)  Max: 97.9 F (36.6 C)  RESPIRATIONS RANGE: Resp  Avg: 18.5  Min: 14  Max: 29  PULSE OXIMETRY RANGE: SpO2  Avg: 93.5 %  Min: 90 %  Max: 97 %  PULSE RANGE: Pulse  Avg: 84.7  Min: 77  Max: 94  BLOOD PRESSURE RANGE: Systolic (24hrs), Avg:139 , Min:108 , Max:162   ; Diastolic (24hrs), Avg:62, Min:50, Max:88    I/O (24Hr):    Intake/Output Summary (Last 24 hours) at 06/22/2022 1654  Last data filed at 06/22/2022 1630  Gross per 24 hour   Intake 1170 ml   Output 687 ml   Net 483 ml     Objective  GEN: Pt in NAD. Heels elevated. No family at [facility]  DERM:Bilateral toes 1-10 discoloration with gangrenous changes. No proximal lymphatic streaking. No drainage or malodor  VASC: Pedal pulses (DP/PT) diminished to B/L LE. Skin temp is warm to cool from proximal to distal for B/L LE. Neg homans/pratts signs to B/L LE. No varicosities or telangectasias noted to B/L LE.  NEURO: Protective and epicritic sensations grossly intact to B/L LE  MSK: (-) POP, No gross deformities. Good muscle tone and bulk noted to B/L LE.  PSYCH: Sedated      Labs/Imaging/Diagnostics    Labs:  CBC:  Recent Labs     06/20/22  0241 06/21/22  0343 06/22/22  0231   WBC 15.7* 19.0* 17.8*   RBC 2.48* 2.72* 2.54*   HGB 7.8* 8.4* 8.1*   HCT 25.7* 28.0* 26.0*   MCV 103.6* 102.9* 102.4*   RDW 19.7* 19.9* 19.9*   PLT 152 153 163     CHEMISTRIES:  Recent Labs     06/20/22  0241 06/21/22  0343 06/22/22  0231   NA 136 134* 133*   K 3.9 3.7 3.7   CL 106 101 101   CO2 24 26 25    BUN 60* 53* 76*    CREATININE 3.30* 2.87* 3.87*   GLUCOSE 192* 153* 152*   PHOS 3.6  --   --    MG 2.3  --   --      PT/INR:No results for input(s): PROTIME, INR in the last 72 hours.  APTT:No results for input(s): APTT in the last 72 hours.  LIVER PROFILE:  Recent Labs     06/22/22  0231   AST 49*   ALT 31   BILIDIR 1.1*   BILITOT 1.7*   ALKPHOS 216*       Imaging Last 24 Hours:  XR CHEST PORTABLE    Result Date: 06/21/2022  EXAM:  XR CHEST PORTABLE INDICATION: Hypoxemia COMPARISON: June 14, 2022 TECHNIQUE: 1 portable chest AP view FINDINGS: Right IJ and left IJ central venous catheters terminate in the SVC. Hazy opacification in the mid to lower  lung zones bilaterally. Suspect moderate to large bilateral pleural effusions. No pneumothorax. Cardiothymic silhouette is unchanged..     Bilateral mid to lower lung airspace disease favoring atelectasis, versus pulmonary edema. Moderate to large bilateral pleural effusions.       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 Yes    Thrombocytopenia (HCC) 06/05/2022 Yes    E coli bacteremia 06/08/2022 Yes    Hepatic abscess 06/08/2022 Yes    Peripheral arterial disease (HCC) 06/08/2022 Yes    Hyperbilirubinemia 06/08/2022 Yes    Gram negative sepsis (HCC) 06/08/2022 Yes    Septicemia (HCC) 06/09/2022 Yes    Aspiration pneumonia of both lower lobes (HCC) 06/12/2022 Yes    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 No new wound formation noted to the feet  - Cont local wound care and offloading while in bed  - We will continue to monitor patient and update surgical plan if needed     WB Status: As tolerated for transfers to B/L LE  Wound Care: Paint with all toes with betadine daily              Kong Packett A. Darleny Sem, DPM, CWSP, AACFAS    [facility] - [facility]  [address], [address] [place], [place] [address]  O: [phone]  F: [phone]     [facility] - [facility] (Opening Sept 2023)  [address] [place], [address]  [place], [place] [address]  O: [phone]  F: [phone]     [facility] - [facility]  [address], [address], [address]  [place], [place] [address]  O: [phone]  F: [phone]     * Available via [facility] 24/7      Electronically signed by Rosana Fret, DPM on 06/22/22 at 4:54 PM EDT

## 2022-06-23 ENCOUNTER — Inpatient Hospital Stay: Admit: 2022-06-23 | Payer: MEDICARE

## 2022-06-23 LAB — POCT GLUCOSE
POC Glucose: 177 mg/dL — ABNORMAL HIGH (ref 65–117)
POC Glucose: 165 mg/dL — ABNORMAL HIGH (ref 65–117)
POC Glucose: 202 mg/dL — ABNORMAL HIGH (ref 65–117)
POC Glucose: 157 mg/dL — ABNORMAL HIGH (ref 65–117)

## 2022-06-23 LAB — PHOSPHORUS: Phosphorus: 3.3 MG/DL (ref 2.6–4.7)

## 2022-06-23 LAB — BASIC METABOLIC PANEL
Anion Gap: 7 mmol/L (ref 5–15)
BUN: 60 MG/DL — ABNORMAL HIGH (ref 6–20)
Bun/Cre Ratio: 18 (ref 12–20)
CO2: 26 mmol/L (ref 21–32)
Calcium: 8 MG/DL — ABNORMAL LOW (ref 8.5–10.1)
Chloride: 101 mmol/L (ref 97–108)
Creatinine: 3.35 MG/DL — ABNORMAL HIGH (ref 0.70–1.30)
Est, Glom Filt Rate: 18 mL/min/{1.73_m2} — ABNORMAL LOW (ref >60)
Glucose: 184 mg/dL — ABNORMAL HIGH (ref 65–100)
Potassium: 3.5 mmol/L (ref 3.5–5.1)
Sodium: 134 mmol/L — ABNORMAL LOW (ref 136–145)

## 2022-06-23 LAB — CULTURE, WOUND
Culture: NO GROWTH
Gram stain: NONE SEEN
Gram stain: NONE SEEN

## 2022-06-23 LAB — CULTURE, ANAEROBIC: Culture: NO GROWTH

## 2022-06-23 LAB — MAGNESIUM: Magnesium: 2.3 mg/dL (ref 1.6–2.4)

## 2022-06-23 MED ORDER — HEPARIN (PORCINE) IN NACL 1000-0.9 UT/500ML-% IV SOLN
Freq: Once | INTRAVENOUS | Status: AC
Start: 2022-06-23 — End: 2022-06-23
  Administered 2022-06-23: 20:00:00 20 mL

## 2022-06-23 MED ORDER — FENTANYL CITRATE (PF) 100 MCG/2ML IJ SOLN
100 MCG/2ML | INTRAMUSCULAR | Status: AC
Start: 2022-06-23 — End: ?

## 2022-06-23 MED ORDER — LIDOCAINE HCL 2 % IJ SOLN
2 % | Freq: Once | INTRAMUSCULAR | Status: AC
Start: 2022-06-23 — End: ?

## 2022-06-23 MED ORDER — LIDOCAINE HCL 2 % IJ SOLN
2 % | INTRAMUSCULAR | Status: AC
Start: 2022-06-23 — End: ?

## 2022-06-23 MED ORDER — HEPARIN (PORCINE) IN NACL 1000-0.9 UT/500ML-% IV SOLN
INTRAVENOUS | Status: AC
Start: 2022-06-23 — End: ?

## 2022-06-23 MED ORDER — FENTANYL CITRATE (PF) 100 MCG/2ML IJ SOLN
100 MCG/2ML | INTRAMUSCULAR | Status: DC | PRN
Start: 2022-06-23 — End: 2022-06-23
  Administered 2022-06-23: 19:00:00 50 ug via INTRAVENOUS

## 2022-06-23 MED ORDER — IOPAMIDOL 76 % IV SOLN
76 % | INTRAVENOUS | Status: AC
Start: 2022-06-23 — End: ?

## 2022-06-23 MED ORDER — COLLAGENASE 250 UNIT/GM EX OINT
250 | Freq: Every day | CUTANEOUS | Status: DC
Start: 2022-06-23 — End: 2022-08-03
  Administered 2022-06-24 – 2022-08-03 (×38): via TOPICAL

## 2022-06-23 MED ORDER — IOPAMIDOL 76 % IV SOLN
76 % | Freq: Once | INTRAVENOUS | Status: AC
Start: 2022-06-23 — End: 2022-06-23
  Administered 2022-06-23: 20:00:00 15 mL

## 2022-06-23 MED FILL — METRONIDAZOLE 500 MG/100ML IV SOLN: 500 MG/100ML | INTRAVENOUS | Qty: 100

## 2022-06-23 MED FILL — AMIODARONE HCL 200 MG PO TABS: 200 MG | ORAL | Qty: 1

## 2022-06-23 MED FILL — FENTANYL CITRATE (PF) 100 MCG/2ML IJ SOLN: 100 MCG/2ML | INTRAMUSCULAR | Qty: 2

## 2022-06-23 MED FILL — ISOVUE-370 76 % IV SOLN: 76 % | INTRAVENOUS | Qty: 100

## 2022-06-23 MED FILL — THERA M PLUS PO TABS: ORAL | Qty: 1

## 2022-06-23 MED FILL — ACETAMINOPHEN 325 MG PO TABS: 325 MG | ORAL | Qty: 2

## 2022-06-23 MED FILL — LIDOCAINE HCL 2 % IJ SOLN: 2 % | INTRAMUSCULAR | Qty: 20

## 2022-06-23 MED FILL — HUMALOG 100 UNIT/ML IJ SOLN: 100 UNIT/ML | INTRAMUSCULAR | Qty: 1

## 2022-06-23 MED FILL — HEPARIN SODIUM (PORCINE) 5000 UNIT/ML IJ SOLN: 5000 UNIT/ML | INTRAMUSCULAR | Qty: 1

## 2022-06-23 MED FILL — CEFEPIME HCL 1 G IJ SOLR: 1 g | INTRAMUSCULAR | Qty: 1000

## 2022-06-23 MED FILL — FAMOTIDINE 20 MG PO TABS: 20 MG | ORAL | Qty: 1

## 2022-06-23 MED FILL — LANTUS 100 UNIT/ML SC SOLN: 100 UNIT/ML | SUBCUTANEOUS | Qty: 1

## 2022-06-23 MED FILL — SANTYL 250 UNIT/GM EX OINT: 250 UNIT/GM | CUTANEOUS | Qty: 30

## 2022-06-23 MED FILL — HEPARIN (PORCINE) IN NACL 1000-0.9 UT/500ML-% IV SOLN: INTRAVENOUS | Qty: 500

## 2022-06-23 MED FILL — RISAQUAD-2 PO CAPS: ORAL | Qty: 1

## 2022-06-23 NOTE — Plan of Care (Signed)
Problem: Physical Therapy - Adult  Goal: By Discharge: Performs mobility at highest level of function for planned discharge setting.  See evaluation for individualized goals.  Description: FUNCTIONAL STATUS PRIOR TO ADMISSION: Patient was independent and active without use of DME. Drove himself to [place] from [place] for [place] race. Denies home O2 use.     HOME SUPPORT PRIOR TO ADMISSION: The patient lived with wife however wife has her own medical issues and cannot provide physical assist.    Physical Therapy Goals  Reassessed 06/20/2022   1.  Patient will move from supine to sit and sit to supine, scoot up and down, and roll side to side in bed with maximal assist of 1 within 7 day(s).   2. Patient will sit EOB x 5 minutes with contact guard assist within 7 days.    3.  Patient will perform sit to stand with maximal assistance x2 within 7 day(s).  4.  Patient will transfer from bed to chair and chair to bed via minimal lift equipment (best mover) within 7 day(s).    Initiated 06/10/2022  1.  Patient will move from supine to sit and sit to supine, scoot up and down, and roll side to side in bed with contact guard assist within 7 day(s).   2. Patient will sit EOB x 10 minutes with supervision/set-up assist within 7 days.    2.  Patient will perform sit to stand with moderate assistance x2 within 7 day(s).  3.  Patient will transfer from bed to chair and chair to bed with moderate assistance x2 using the least restrictive device within 7 day(s).  4.  Patient will ambulate with moderate assistance x2 for 5 feet with the least restrictive device within 7 day(s).   Outcome: Progressing     PHYSICAL THERAPY TREATMENT    Patient: Jaime [name] (76 y.o. male)  Date: 06/23/2022  Diagnosis: Septicemia (HCC) [A41.9]  Gastric perforation (HCC) [K25.5]  Perforated abdominal viscus [R19.8] Gastric perforation (HCC)  Procedure(s) (LRB):  LAPAROTOMY EXPLORATORY (N/A)  LAPAROSCOPY DIAGNOSTIC (N/A)  ENDOSCOPY OF ILEAL  CONDUIT (N/A) 22 Days Post-Op  Precautions: Fall Risk                    ASSESSMENT:  Patient continues to benefit from skilled PT services and is slowly progressing towards goals. Pt was received in supine and cleared by nursing to mobilize. Vitals stable during session.  Came to the EOB and tolerated sitting for ~ 10 minutes working on sitting balance with verbal and tactile cues. Able to perform LAQ and shoulder elevation. Fatigued and was returned to supine.          PLAN:  Patient continues to benefit from skilled intervention to address the above impairments.  Continue treatment per established plan of care.    Recommendation for discharge: (in order for the patient to meet his/her long term goals): Therapy 3 hours/day 5-7 days/week    Other factors to consider for discharge: high risk for falls    IF patient discharges home will need the following DME: continuing to assess with progress       SUBJECTIVE:   Patient stated, "I did better than I thought I would."    OBJECTIVE DATA SUMMARY:   Critical Behavior:  Orientation  Overall Orientation Status: Within Functional Limits  Orientation Level: Oriented to person;Oriented to situation;Oriented to place (did not assess orientation to time)  Cognition  Overall Cognitive Status: [facility]    Functional Mobility  Training:  Bed Mobility:  Bed Mobility Training  Bed Mobility Training: Yes  Interventions: Verbal cues;Tactile cues;Safety awareness training  Supine to Sit: Maximum assistance;Assist X2;Moderate assistance  Sit to Supine: Maximum assistance;Assist X2  Scooting: Maximum assistance  Transfers:  Transfer Training  Transfer Training: No  Balance:  Balance  Sitting: Impaired  Sitting - Static: Fair (occasional)  Sitting - Dynamic: Fair (occasional);Poor (constant support)   Ambulation/Gait Training:        Neuro Re-Education:                    Pain Rating:  No major complaints    Pain Intervention(s):       Activity Tolerance:   Good    After treatment:   Patient  left in no apparent distress in bed and Call bell within reach      COMMUNICATION/EDUCATION:   The patient's plan of care was discussed with: occupational therapist and registered nurse    Patient Education  Education Given To: Patient  Education Provided: Role of Therapy;Home Exercise Program  Education Method: Verbal  Barriers to Learning: None  Education Outcome: Verbalized understanding;Continued education needed      Girtha Rm, PT, DPT  Minutes: 23

## 2022-06-23 NOTE — Progress Notes (Addendum)
0720-Bedside shift change report given to Trula Ore, Charity fundraiser and Jill Side, Charity fundraiser (oncoming nurses) by Maralyn Sago, Museum/gallery conservator). Report included the following information SBAR, Kardex, ED Summary, Procedure Summary, Intake/Output, MAR, Recent Results, and Cardiac Rhythm -NSR .     0800-Shift assessment completed by Jill Side, RN-see flowsheet for detailed findings.    0830-Dr. Cordie Grice in to see pt-no new orders-plans to place pt. on a M-W-F HD routine.    0928-Janice, Radiology NP, called at this time and stated the plan is to get patient down to get a larger drain placed today. Aware of continuous TF-states it does not need to be cut off.    1200-Reassessment complete-no changes noted.    1240-Pt. left the floor at this time with transport to go to IR.    1606-TRANSFER - IN REPORT:    Verbal report received from Raven, RN on Taiven Greenley being received from IR/[facility]io for routine post-op      Report consisted of patient's Situation, Background, Assessment and   Recommendations(SBAR).     Information from the following report(s) Nurse Handoff Report was reviewed with the receiving nurse.    Opportunity for questions and clarification was provided.      1608-Assessment completed upon patient's arrival to unit and care assumed.    1640-Dr. Adhikari in to see pt.    1930-Bedside shift change report given to Vanice Sarah, Charity fundraiser (Cabin crew) by Trula Ore, RN and Jill Side, RN (offgoing nurses). Report included the following information SBAR, Kardex, ED Summary, Procedure Summary, Intake/Output, MAR, Recent Results, and Cardiac Rhythm -NSR .

## 2022-06-23 NOTE — Progress Notes (Signed)
1320:    Pt arrived to XR recovery for . Pt is A/O x4 with no complaints of pain. VS to be collected and consent to be obtained. Pt bed is locked and in lowest position and call bell is within reach.      1530:    Name of procedure: Abdominal Drain Exchange    Fentanyl: 50 mcg    Reversal Agent Used: No    Vital Signs: Stable    Fluids removed: None    Samples sent to lab: None    Any complications related to procedure: No    Post Procedure Care Needed/order sets placed in connect care.     Patient is at increased fall risk due to medication given.    Per Dr. Lowell Guitar, please continue to flush drain each shift.    1606:    TRANSFER - OUT REPORT:    Verbal report given to Trula Ore, RN on Odella Aquas  being transferred to CCU for routine post-op       Report consisted of patient's Situation, Background, Assessment and   Recommendations(SBAR).     Information from the following report(s) Nurse Handoff Report was reviewed with the receiving nurse.           Lines:   CVC Triple Lumen 06/14/22 Internal jugular (Active)   Central Line Being Utilized Yes 06/23/22 1200   Criteria for Appropriate Use Limited/no vessel suitable for conventional peripheral access 06/23/22 1200   Site Assessment Clean, dry & intact 06/23/22 1200   Phlebitis Assessment No symptoms 06/23/22 1200   Infiltration Assessment 0 06/23/22 1200   Color/Movement/Sensation Capillary refill less than 3 sec 06/23/22 1200   Proximal Lumen Color/Status White;Capped 06/23/22 1200   Medial Lumen Status Blue;Capped 06/23/22 1200   Distal Lumen Color/Status Brown;Infusing 06/23/22 1200   Line Care Connections checked and tightened 06/23/22 1200   Alcohol Cap Used Yes 06/23/22 1200   Date of Last Dressing Change 06/23/22 06/23/22 1200   Dressing Type Transparent w/CHG gel 06/23/22 1200   Dressing Status Clean, dry & intact 06/23/22 1200   Dressing Intervention New 06/19/22 0544       Hemodialysis Central Access Right Neck (Active)   Continued need for line? Yes  06/23/22 0400   Site Assessment Clean, dry & intact 06/23/22 1200   CVC Lumen Status Capped 06/21/22 1600   Venous Lumen Status Capped 06/23/22 1200   Arterial Lumen Status Capped 06/23/22 1200   Alcohol Cap Used Yes 06/23/22 1200   Line Care Connections checked and tightened;Chlorhexidine wipes 06/23/22 0400   Dressing Type Bacteriocidal;Transparent 06/23/22 1200   Date of Last Dressing Change 06/23/22 06/23/22 1200   Dressing Status Clean, dry & intact 06/23/22 1200   Dressing Intervention Dressing changed;New 06/23/22 0600   Dressing Change Due 06/26/22 06/23/22 1200   Verification by x-ray Yes 06/15/22 0700        Opportunity for questions and clarification was provided.      Site is CDI.

## 2022-06-23 NOTE — Progress Notes (Addendum)
0710- Bedside, Verbal, and Written shift change report given to [facility] RN & Jill Side RN (oncoming nurse) by Melina Copa (offgoing nurse). Report included the following information SBAR, Kardex, ED Summary, OR Summary, Intake/Output, MAR, and Cardiac Rhythm NSR .      0800- Assessment completed, see flowsheets.    1200- Reassessment completed, see flowsheets.    1215- This nurse off floor at this time.  1600- This nurse back on floor at this time.    1605- Pt returned to floor from IR.    1608- Reassessment performed, see flowsheets.    1925- Bedside, Verbal, and Written shift change report given to Vanice Sarah RN (oncoming nurse) by Trula Ore RN & Jill Side RN (offgoing nurse). Report included the following information SBAR, Kardex, ED Summary, OR Summary, Intake/Output, MAR, and Cardiac Rhythm NSR .     Gus Puma, RN

## 2022-06-23 NOTE — Plan of Care (Signed)
Problem: Occupational Therapy - Adult  Goal: By Discharge: Performs self-care activities at highest level of function for planned discharge setting.  See evaluation for individualized goals.  Description: FUNCTIONAL STATUS PRIOR TO ADMISSION:    , ADL Assistance: Independent,  ,  ,  ,  ,  ,  , Ambulation Assistance: Independent, Transfer Assistance: Independent, Active Driver: Yes     HOME SUPPORT: Patient lived with spouse and was independent.    Occupational Therapy Goals:  Initiated 06/10/2022  Re-evaluation 06/20/2022 - continue all goals  1.  Patient will perform grooming at bed level with Moderate Assist within 7 day(s).  2.  Patient will perform self-feeding with Moderate Assist within 7 day(s).  3.  Patient will perform upper body dressing with Maximal Assist within 7 day(s).  4.  Patient will complete supine>sit with mod assist x2  within 7 day(s).  5.  Patient will tolerate sitting EOB with fair sitting balance in preparation for ADLs within 7 day(s).      Outcome: Progressing     OCCUPATIONAL THERAPY TREATMENT  Patient: Jaime [name] (76 y.o. male)  Date: 06/23/2022  Primary Diagnosis: Septicemia (HCC) [A41.9]  Gastric perforation (HCC) [K25.5]  Perforated abdominal viscus [R19.8]  Procedure(s) (LRB):  LAPAROTOMY EXPLORATORY (N/A)  LAPAROSCOPY DIAGNOSTIC (N/A)  ENDOSCOPY OF ILEAL CONDUIT (N/A) 22 Days Post-Op   Precautions: Fall Risk                Chart, occupational therapy assessment, plan of care, and goals were reviewed.    ASSESSMENT  Patient continues to benefit from skilled OT services and is progressing towards goals. Pt tolerated OT session well. Pt demonstrated improvement with bed mobility and sitting balance today, tolerating sitting Eob ~7-8 minutes, with primarily SBA-CGA, with intermittent min assist. Pt demonstrated ability to return to midline with cueing when leaning posteriorly/anteriorly and could initiate BUE/BLE AROM. Pt fatigued quickly, with sitting balance declining as fatigue  set in. Pt was motivated to work with therapy and will benefit from IPR.              PLAN :  Patient continues to benefit from skilled intervention to address the above impairments.  Continue treatment per established plan of care to address goals.    Recommend with staff: encourage BUE AROM    Recommend next OT session: continue POC    Recommendation for discharge: (in order for the patient to meet his/her long term goals): Therapy 3 hours/day 5-7 days/week    Other factors to consider for discharge: patient's current support system is unable to meet their requirements for physical assistance, high risk for falls, not safe to be alone, and concern for safely navigating or managing the home environment    IF patient discharges home will need the following DME: continuing to assess with progress       SUBJECTIVE:   Patient stated "I'm doing better than I thought I would."    OBJECTIVE DATA SUMMARY:   Cognitive/Behavioral Status:  Orientation  Overall Orientation Status: Within Functional Limits  Orientation Level: Oriented to person;Oriented to situation;Oriented to place (did not assess orientation to time)  Cognition  Overall Cognitive Status: [facility]    Functional Mobility and Transfers for ADLs:  Bed Mobility:  Bed Mobility Training  Bed Mobility Training: Yes  Interventions: Verbal cues;Tactile cues;Safety awareness training  Supine to Sit: Maximum assistance;Assist X2;Moderate assistance  Sit to Supine: Maximum assistance;Assist X2  Scooting: Maximum assistance     Transfers:   Teacher, early years/pre  Transfer Training: No      Balance:     Balance  Sitting: Impaired  Sitting - Static: Fair (occasional)  Sitting - Dynamic: Fair (occasional);Poor (constant support)      ADL Intervention:                                                                           Pt tolerated sitting Eob ~7-8 minutes with SBA-min assist and mod verbal/tactile cues for sitting balance. Pt able to utilize BUEs intermittently to support UB,  however required mod verbal cues for UE positioning. Pt noted with anterior leaning intermittently, or with posterior/L lateral leaning, but able to return to midline with cueing.    Pt able to complete B elbow flex/ext and pro/supination seated EOB.    Pt fatigued quickly, requiring return to supine after 7-8 minutes.             Pain Rating:  No pain reported   Pain Intervention(s):         Activity Tolerance:   Fair   Please refer to the flowsheet for vital signs taken during this treatment.    After treatment:   Patient left in no apparent distress in bed, Call bell within reach, and Side rails x3    COMMUNICATION/EDUCATION:   The patient's plan of care was discussed with: physical therapist and registered nurse    Patient Education  Education Given To: Patient  Education Provided: Role of Therapy;Plan of Care  Education Method: Verbal  Barriers to Learning: None  Education Outcome: Verbalized understanding;Continued education needed    Thank you for this referral.  Robie Ridge, OT  Minutes: 23

## 2022-06-23 NOTE — Progress Notes (Signed)
NAME: Jaime [name]        DOB:  09-09-1946        MRN:  [id number]                     Assessment   :                                               Plan:  AKI  Pancreatitis  Hyponatremia  DM  anemia No acute need for HD today.  Plan HD in AM.    H/H not at goal.  Continue ESA.    DW RN            Subjective:     Chief Complaint:  Extubated.  No complaints.    Review of Systems:    Symptom Y/N Comments  Symptom Y/N Comments   Fever/Chills    Chest Pain     Poor Appetite    Edema     Cough    Abdominal Pain     Sputum    Joint Pain     SOB/DOE    Pruritis/Rash     Nausea/vomit    Tolerating PT/OT     Diarrhea    Tolerating Diet     Constipation    Other       Could not obtain due to:      Objective:     VITALS:   Last 24hrs VS reviewed since prior progress note. Most recent are:  Vitals:    06/23/22 1000   BP: 139/71   Pulse: 86   Resp: 17   Temp:    SpO2: 94%       Intake/Output Summary (Last 24 hours) at 06/23/2022 1036  Last data filed at 06/23/2022 0600  Gross per 24 hour   Intake 1555 ml   Output 3575 ml   Net -2020 ml        Telemetry Reviewed:     PHYSICAL EXAM:  General: NAD  1+ edema      Lab Data Reviewed: (see below)    Medications Reviewed: (see below)    PMH/SH reviewed - no change compared to H&P  ________________________________________________________________________  Care Plan discussed with:  Patient     Family      RN     Care Manager                    Consultant:          Comments   >50% of visit spent in counseling and coordination of care       ________________________________________________________________________  Marina Gravel, MD     Procedures: see electronic medical records for all procedures/Xrays and details which  were not copied into this note but were reviewed prior to creation of Plan.      LABS:  Recent Labs     06/21/22  0343 06/22/22  0231   WBC 19.0* 17.8*   HGB 8.4* 8.1*   HCT 28.0* 26.0*   PLT 153  163       Recent Labs     06/21/22  0343 06/22/22  0231 06/23/22  0112   NA 134* 133* 134*   K 3.7 3.7 3.5   CL 101 101 101   CO2 26 25 26    BUN 53* 76* 60*   MG  --   --  2.3   PHOS  --   --  3.3       Recent Labs     06/22/22  0231   GLOB 4.5*       No results for input(s): INR, APTT in the last 72 hours.    Invalid input(s): PTP   No results for input(s): TIBC, FERR in the last 72 hours.    Invalid input(s): FE, PSAT   No results found for: FOL, RBCF   No results for input(s): PH, PCO2, PO2 in the last 72 hours.  No results for input(s): CPK, CKMB, TROPONINI in the last 72 hours.  No components found for: [facility]  @labua @    MEDICATIONS:  Current Facility-Administered Medications   Medication Dose Route Frequency    epoetin alfa-epbx (RETACRIT) injection 6,000 Units  6,000 Units SubCUTAneous Once per day on Mon Wed Fri    famotidine (PEPCID) tablet 20 mg  20 mg Per NG tube Daily    amiodarone (CORDARONE) tablet 200 mg  200 mg Per NG tube Daily    therapeutic multivitamin-minerals 1 tablet  1 tablet Per NG tube Daily    fentaNYL (SUBLIMAZE) injection 25 mcg  25 mcg IntraVENous Q1H PRN    heparin (porcine) injection 5,000 Units  5,000 Units SubCUTAneous BID    fluconazole (DIFLUCAN) in 0.9 % sodium chloride IVPB 200 mg  200 mg IntraVENous Q48H    oxyCODONE (ROXICODONE) immediate release tablet 5 mg  5 mg Oral Q4H PRN    insulin glargine (LANTUS) injection vial 16 Units  16 Units SubCUTAneous Nightly    midodrine (PROAMATINE) tablet 5 mg  5 mg Oral Daily PRN    insulin lispro (HUMALOG) injection vial 0-4 Units  0-4 Units SubCUTAneous 4 times per day    cefepime (MAXIPIME) 1,000 mg in sodium chloride 0.9 % 50 mL IVPB (mini-bag)  1,000 mg IntraVENous Q24H    metronidazole (FLAGYL) 500 mg in 0.9% NaCl 100 mL IVPB premix  500 mg IntraVENous Q12H    acidophilus probiotic capsule 1 capsule  1 capsule Oral Daily    alcohol 62% (NOZIN) nasal sanitizer 1 ampule  1 ampule Topical BID    bisacodyl (DULCOLAX) suppository 10 mg   10 mg Rectal Daily PRN    albumin human 25% IV solution 25 g  25 g IntraVENous PRN    heparin (porcine) injection 1,200 Units  1,200 Units IntraCATHeter PRN    And    heparin (porcine) injection 1,000 Units  1,000 Units IntraCATHeter PRN    sodium chloride 0.9 % bolus 100 mL  100 mL IntraVENous PRN    balsum peru-castor oil (VENELEX) ointment   Topical BID    glucose chewable tablet 16 g  4 tablet Oral PRN    dextrose bolus 10% 125 mL  125 mL IntraVENous PRN    Or    dextrose bolus 10% 250 mL  250 mL IntraVENous PRN    glucagon (rDNA) injection 1 mg  1 mg SubCUTAneous PRN    dextrose 10 % infusion   IntraVENous Continuous PRN    sodium chloride flush 0.9 % injection 5-40 mL  5-40 mL IntraVENous 2 times per day    sodium chloride flush 0.9 % injection 5-40 mL  5-40 mL IntraVENous PRN    0.9 % sodium chloride infusion   IntraVENous PRN    acetaminophen (TYLENOL) tablet 650 mg  650 mg Oral Q6H PRN    Or    acetaminophen (TYLENOL) suppository 650 mg  650  mg Rectal Q6H PRN    ondansetron (ZOFRAN) injection 4 mg  4 mg IntraVENous Q6H PRN

## 2022-06-23 NOTE — H&P (Signed)
Radiology History and Physical    Patient: Jaime [name] 76 y.o. male       Chief Complaint: Hypotension (Hx hypertension - BP is 70s/40s), Altered Mental Status, Nausea, and Emesis      History of Present Illness: Liver abscess drain with persistent output for check/ change.     History:    History reviewed. No pertinent past medical history.  No family history on file.  Social History     Socioeconomic History    Marital status: Married     Spouse name: Not on file    Number of children: Not on file    Years of education: Not on file    Highest education level: Not on file   Occupational History    Not on file   Tobacco Use    Smoking status: Never    Smokeless tobacco: Never   Substance and Sexual Activity    Alcohol use: Not on file    Drug use: Not on file    Sexual activity: Not on file   Other Topics Concern    Not on file   Social History Narrative    Not on file     Social Determinants of Health     Financial Resource Strain: Not on file   Food Insecurity: Not on file   Transportation Needs: Not on file   Physical Activity: Not on file   Stress: Not on file   Social Connections: Not on file   Intimate Partner Violence: Not on file   Housing Stability: Not on file       Allergies:   Allergies   Allergen Reactions    Augmentin [Amoxicillin-Pot Clavulanate] Hives     Tolerated ceftriaxone 06/2022    Codeine      Unknown reaction       Current Medications:  Current Facility-Administered Medications   Medication Dose Route Frequency    lidocaine 2 % injection 20 mL  20 mL IntraDERmal Once    fentaNYL (SUBLIMAZE) injection 100 mcg  100 mcg IntraVENous Q1H PRN    [START ON 06/24/2022] collagenase ointment   Topical Daily    epoetin alfa-epbx (RETACRIT) injection 6,000 Units  6,000 Units SubCUTAneous Once per day on Mon Wed Fri    famotidine (PEPCID) tablet 20 mg  20 mg Per NG tube Daily    amiodarone (CORDARONE) tablet 200 mg  200 mg Per NG tube Daily    therapeutic multivitamin-minerals 1 tablet  1 tablet Per NG  tube Daily    fentaNYL (SUBLIMAZE) injection 25 mcg  25 mcg IntraVENous Q1H PRN    heparin (porcine) injection 5,000 Units  5,000 Units SubCUTAneous BID    fluconazole (DIFLUCAN) in 0.9 % sodium chloride IVPB 200 mg  200 mg IntraVENous Q48H    oxyCODONE (ROXICODONE) immediate release tablet 5 mg  5 mg Oral Q4H PRN    insulin glargine (LANTUS) injection vial 16 Units  16 Units SubCUTAneous Nightly    midodrine (PROAMATINE) tablet 5 mg  5 mg Oral Daily PRN    insulin lispro (HUMALOG) injection vial 0-4 Units  0-4 Units SubCUTAneous 4 times per day    cefepime (MAXIPIME) 1,000 mg in sodium chloride 0.9 % 50 mL IVPB (mini-bag)  1,000 mg IntraVENous Q24H    metronidazole (FLAGYL) 500 mg in 0.9% NaCl 100 mL IVPB premix  500 mg IntraVENous Q12H    acidophilus probiotic capsule 1 capsule  1 capsule Oral Daily    alcohol 62% (NOZIN) nasal sanitizer  1 ampule  1 ampule Topical BID    bisacodyl (DULCOLAX) suppository 10 mg  10 mg Rectal Daily PRN    albumin human 25% IV solution 25 g  25 g IntraVENous PRN    heparin (porcine) injection 1,200 Units  1,200 Units IntraCATHeter PRN    And    heparin (porcine) injection 1,000 Units  1,000 Units IntraCATHeter PRN    sodium chloride 0.9 % bolus 100 mL  100 mL IntraVENous PRN    balsum peru-castor oil (VENELEX) ointment   Topical BID    glucose chewable tablet 16 g  4 tablet Oral PRN    dextrose bolus 10% 125 mL  125 mL IntraVENous PRN    Or    dextrose bolus 10% 250 mL  250 mL IntraVENous PRN    glucagon (rDNA) injection 1 mg  1 mg SubCUTAneous PRN    dextrose 10 % infusion   IntraVENous Continuous PRN    sodium chloride flush 0.9 % injection 5-40 mL  5-40 mL IntraVENous 2 times per day    sodium chloride flush 0.9 % injection 5-40 mL  5-40 mL IntraVENous PRN    0.9 % sodium chloride infusion   IntraVENous PRN    acetaminophen (TYLENOL) tablet 650 mg  650 mg Oral Q6H PRN    Or    acetaminophen (TYLENOL) suppository 650 mg  650 mg Rectal Q6H PRN    ondansetron (ZOFRAN) injection 4 mg   4 mg IntraVENous Q6H PRN        Physical Exam:  Blood pressure 138/64, pulse 88, temperature 98.9 F (37.2 C), temperature source Axillary, resp. rate 18, height 1.727 m (5\' 8" ), weight 106.5 kg (234 lb 12.6 oz), SpO2 92 %.  GENERAL: alert, cooperative, no distress, appears stated age,   LUNG: Nonlabored respiration on room air  HEART: Well perfused   EXT: No edema BLEs  ABD: Nondistended    ASA 3  Mallampati 3    Alerts:      Laboratory:      Recent Labs     06/22/22  0231 06/23/22  0112   HGB 8.1*  --    HCT 26.0*  --    WBC 17.8*  --    PLT 163  --    BUN 76* 60*   K 3.7 3.5         Plan of Care/Planned Procedure:  Risks, benefits, and alternatives reviewed with patient and he agrees to proceed with the procedure.     Proceed with drain exchange.     Deemed appropriate for moderate sedation with versed and fentanyl.    06/25/22, MD  [facility]  3:34 PM, 06/23/2022

## 2022-06-23 NOTE — Progress Notes (Signed)
Hospitalist Progress Note    NAME:   Jaime [name]   DOB: 11/24/1945   MRN: [id number]     Date/Time: 06/23/2022 4:26 PM  Patient PCP: No primary care provider on file.    EDD: 8/27  Barriers: Feeding improvement vs need of PEG tube, Nephrology/ ID clearance.  Case manager working on transfer to [employer]?    76 y/m with prolonged hospitalization, was admitted to surgical service on 7/31 for perforated hollow viscus underwent emergent laparotomy/ septic shock d/t peritonitis, was intubated and extubated x 2. Also had AKI needing CVVH, now on HD. Also has bilateral foot gangrene. Was transferred to hospitalist service on 8/21.     Assessment / Plan:  Septic shock  D/t perforated viscus  S/p Laparotomy 7/31  Liver abscess s/p IR drainage    Now off pressors  General surgery and ID following  Follow up cultures to finalize, Blood culture 7/31 Culture grew E coli  Continue with Cefepime, flagyl, Fluconazole  Wound care following  Liver abscess measuring 10 cm.   S/p IR drainage on 8/4, culture positive for E coli  Liver drain not draining much, went for drain exchange today  Has fecal management system, remove once collection decreases    Acute hypoxemic respiratory failure:  Intubated 7/31, extubation 8/7  Reintubated 8/13, extubated on 8/19  Currently on 2 liters NC    Dysphagia:  Ongoing SLP evaluation  Aspiration on FEES study  NPO for now  Continue with NG tube feeding    Acute kidney injury:  Now on HD  Continue with HD  Nephrology following    Bilateral lower extremity critical limb ischemia:  Gangrene both feet:  Podiatry and vascular surgery following    Diabetes mellitus:  Sliding scale and lantus  Monitor FS  Diabetes team following    Sacral wound:  Wound care following    Medical Decision Making:   I personally reviewed labs: CBC, BMP  I personally reviewed imaging: CT abdomen  I personally reviewed EKG: None  Toxic drug monitoring: Monitor creatinine with abx  Discussed case with: RN,  patient    Code Status: Full  DVT Prophylaxis: heparin  GI Prophylaxis:    Subjective:     Chief Complaint / Reason for Physician Visit  Seen and evaluated in ICU for Septic shock d/t viscus perforation  Seen after IR liver drain exchange  Denies much pain  Has NG tube feed ongoing  Discussed with nurse at bedside      Objective:     VITALS:   Last 24hrs VS reviewed since prior progress note. Most recent are:  Patient Vitals for the past 24 hrs:   BP Temp Temp src Pulse Resp SpO2 Weight   06/23/22 1530 138/64 -- -- 88 18 92 % --   06/23/22 1525 (!) 151/65 -- -- 91 15 91 % --   06/23/22 1520 (!) 140/65 -- -- 91 19 91 % --   06/23/22 1515 (!) 154/90 -- -- 90 17 91 % --   06/23/22 1329 (!) 139/50 98.9 F (37.2 C) Axillary 94 21 94 % --   06/23/22 1200 (!) 127/59 98.8 F (37.1 C) Axillary 90 20 93 % --   06/23/22 1145 -- -- -- 87 18 95 % --   06/23/22 1130 -- -- -- 89 18 94 % --   06/23/22 1115 138/73 -- -- 93 22 (!) 89 % --   06/23/22 1100 (!) 150/80 -- -- 89 20 91 % --  06/23/22 1045 -- -- -- 91 19 93 % --   06/23/22 1030 -- -- -- 89 16 94 % --   06/23/22 1015 -- -- -- 88 20 94 % --   06/23/22 1000 139/71 -- -- 86 17 94 % --   06/23/22 0945 -- -- -- 84 17 94 % --   06/23/22 0930 -- -- -- 84 16 94 % --   06/23/22 0915 -- -- -- 82 16 94 % --   06/23/22 0900 (!) 155/58 -- -- 87 22 94 % --   06/23/22 0845 -- -- -- 89 20 94 % --   06/23/22 0830 -- -- -- 88 17 94 % --   06/23/22 0815 -- -- -- 86 24 95 % --   06/23/22 0800 131/60 97.5 F (36.4 C) Axillary 88 21 95 % --   06/23/22 0745 -- -- -- 83 19 94 % --   06/23/22 0730 -- -- -- 83 17 95 % --   06/23/22 0715 -- -- -- 86 20 95 % --   06/23/22 0600 (!) 137/52 -- -- 84 17 93 % --   06/23/22 0500 -- -- -- 84 16 94 % --   06/23/22 0400 (!) 136/59 97.6 F (36.4 C) Axillary 84 24 93 % --   06/23/22 0300 -- -- -- 84 15 94 % --   06/23/22 0200 (!) 117/50 -- -- 84 16 93 % --   06/23/22 0100 -- -- -- 92 19 93 % --   06/23/22 0000 (!) 119/59 97.6 F (36.4 C) Axillary 94 20 94 %  --   06/22/22 2300 -- -- -- 94 23 95 % --   06/22/22 2200 (!) 119/49 -- -- 89 20 93 % --   06/22/22 2100 (!) 130/49 -- -- 97 22 92 % --   06/22/22 2000 (!) 131/52 97.8 F (36.6 C) Oral 90 19 95 % --   06/22/22 1900 (!) 150/56 -- -- 92 (!) 33 94 % --   06/22/22 1845 -- -- -- 87 20 94 % --   06/22/22 1830 -- -- -- 93 19 94 % --   06/22/22 1815 -- -- -- 90 22 93 % --   06/22/22 1800 (!) 139/56 -- -- 86 17 94 % --   06/22/22 1745 (!) 135/57 -- -- 90 22 95 % --   06/22/22 1730 (!) 138/56 -- -- 89 20 95 % --   06/22/22 1715 (!) 139/59 -- -- 93 21 95 % --   06/22/22 1700 -- -- -- 84 21 96 % --   06/22/22 1655 118/63 97.4 F (36.3 C) -- 91 19 95 % 106.5 kg (234 lb 12.6 oz)   06/22/22 1645 116/63 -- -- 92 17 -- --   06/22/22 1630 (!) 126/59 -- -- 90 19 96 % --         Intake/Output Summary (Last 24 hours) at 06/23/2022 1626  Last data filed at 06/23/2022 0800  Gross per 24 hour   Intake 1330 ml   Output 3275 ml   Net -1945 ml        I had a face to face encounter and independently examined this patient on 06/23/2022, as outlined below:  PHYSICAL EXAM:  General: Alert, cooperative  EENT:  EOMI. Anicteric sclerae, NG tube  Resp:  CTA bilaterally, no wheezing or rales.  No accessory muscle use  CV:  Regular  rhythm,  No edema  GI:  RUQ drain empty  bag, Soft, Non distended, Non tender.  +Bowel sounds, fecal drain system  Neurologic:  Alert and oriented X 3, normal speech,   Psych:   Good insight. Not anxious nor agitated  Skin:  B/L Toe gangrene    Reviewed most current lab test results and cultures  YES  Reviewed most current radiology test results   YES  Review and summation of old records today    NO  Reviewed patient's current orders and MAR    YES  PMH/SH reviewed - no change compared to H&P  ________________________________________________________________________  Care Plan discussed with:    Comments   Patient x    Family      RN x    Care Manager     Consultant                        Multidiciplinary team rounds were held  today with case manager, nursing, pharmacist and Higher education careers adviser.  Patient's plan of care was discussed; medications were reviewed and discharge planning was addressed.     ________________________________________________________________________  Total NON critical care TIME:  55  Minutes    Total CRITICAL CARE TIME Spent:   Minutes non procedure based      Comments   >50% of visit spent in counseling and coordination of care     ________________________________________________________________________  Loman Chroman, MD     Procedures: see electronic medical records for all procedures/Xrays and details which were not copied into this note but were reviewed prior to creation of Plan.      LABS:  I reviewed today's most current labs and imaging studies.  Pertinent labs include:  Recent Labs     06/21/22  0343 06/22/22  0231   WBC 19.0* 17.8*   HGB 8.4* 8.1*   HCT 28.0* 26.0*   PLT 153 163     Recent Labs     06/21/22  0343 06/22/22  0231 06/23/22  0112   NA 134* 133* 134*   K 3.7 3.7 3.5   CL 101 101 101   CO2 26 25 26    GLUCOSE 153* 152* 184*   BUN 53* 76* 60*   CREATININE 2.87* 3.87* 3.35*   CALCIUM 7.9* 8.1* 8.0*   MG  --   --  2.3   PHOS  --   --  3.3   LABALBU  --  1.6*  --    BILITOT  --  1.7*  --    AST  --  49*  --    ALT  --  31  --        Signed: , MD

## 2022-06-23 NOTE — Consults (Signed)
INTERVENTIONAL RADIOLOGY  Consult Note      Patient:  Jaime [name]  DOB:  1946-04-13  Age:  76 y.o.  MRN:  [id number]    Today's Date:  06/23/2022  Admission Date:  05/31/2022  Hospital Day:  22  Consult requested by:  Bella Kennedy, APRN    Reason for consult:  Evaluate liver abscess drain    CURRENT MEDICATIONS  Current Facility-Administered Medications   Medication Dose Route Frequency Provider Last Rate Last Admin    epoetin alfa-epbx (RETACRIT) injection 6,000 Units  6,000 Units SubCUTAneous Once per day on Mon Wed Fri Alver Fisher, MD   6,000 Units at 06/22/22 2111    famotidine (PEPCID) tablet 20 mg  20 mg Per NG tube Daily Loman Chroman, MD   20 mg at 06/23/22 2725    amiodarone (CORDARONE) tablet 200 mg  200 mg Per NG tube Daily Merwyn Katos, MD   200 mg at 06/23/22 0844    therapeutic multivitamin-minerals 1 tablet  1 tablet Per NG tube Daily Merwyn Katos, MD   1 tablet at 06/23/22 0844    fentaNYL (SUBLIMAZE) injection 25 mcg  25 mcg IntraVENous Q1H PRN Merwyn Katos, MD   25 mcg at 06/22/22 1107    heparin (porcine) injection 5,000 Units  5,000 Units SubCUTAneous BID Merwyn Katos, MD   5,000 Units at 06/23/22 0844    fluconazole (DIFLUCAN) in 0.9 % sodium chloride IVPB 200 mg  200 mg IntraVENous Q48H Merwyn Katos, MD 100 mL/hr at 06/22/22 1830 200 mg at 06/22/22 1830    oxyCODONE (ROXICODONE) immediate release tablet 5 mg  5 mg Oral Q4H PRN Merwyn Katos, MD   5 mg at 06/20/22 1628    insulin glargine (LANTUS) injection vial 16 Units  16 Units SubCUTAneous Nightly Bess Harvest, APRN - CNS   16 Units at 06/22/22 2113    midodrine (PROAMATINE) tablet 5 mg  5 mg Oral Daily PRN Catha Brow, MD   5 mg at 06/20/22 1650    insulin lispro (HUMALOG) injection vial 0-4 Units  0-4 Units SubCUTAneous 4 times per day Catha Brow, MD   1 Units at 06/20/22 1219    cefepime (MAXIPIME) 1,000 mg in sodium chloride 0.9 % 50 mL IVPB (mini-bag)  1,000 mg IntraVENous Q24H Richardean Chimera, MD    Stopped at 06/22/22 1811    metronidazole (FLAGYL) 500 mg in 0.9% NaCl 100 mL IVPB premix  500 mg IntraVENous Q12H Richardean Chimera, MD   Stopped at 06/23/22 0213    acidophilus probiotic capsule 1 capsule  1 capsule Oral Daily Richardean Chimera, MD   1 capsule at 06/23/22 0844    alcohol 62% (NOZIN) nasal sanitizer 1 ampule  1 ampule Topical BID Wynetta Fines Chigurupati, MD   1 ampule at 06/23/22 0843    bisacodyl (DULCOLAX) suppository 10 mg  10 mg Rectal Daily PRN Yevonne Pax, MD        albumin human 25% IV solution 25 g  25 g IntraVENous PRN Andrez Grime, MD   Stopped at 06/13/22 1557    heparin (porcine) injection 1,200 Units  1,200 Units IntraCATHeter PRN Andrez Grime, MD   1,200 Units at 06/22/22 1733    And    heparin (porcine) injection 1,000 Units  1,000 Units IntraCATHeter PRN Andrez Grime, MD   1,000 Units at 06/22/22 1733    sodium chloride 0.9 % bolus 100 mL  100 mL IntraVENous  PRN Luisa Hart, MD        balsum peru-castor oil (VENELEX) ointment   Topical BID Sarita Bottom, APRN - NP   Given at 06/23/22 0845    glucose chewable tablet 16 g  4 tablet Oral PRN Bess Harvest, APRN - CNS        dextrose bolus 10% 125 mL  125 mL IntraVENous PRN Bess Harvest, APRN - CNS        Or    dextrose bolus 10% 250 mL  250 mL IntraVENous PRN Bess Harvest, APRN - CNS        glucagon (rDNA) injection 1 mg  1 mg SubCUTAneous PRN Bess Harvest, APRN - CNS        dextrose 10 % infusion   IntraVENous Continuous PRN Bess Harvest, APRN - CNS        sodium chloride flush 0.9 % injection 5-40 mL  5-40 mL IntraVENous 2 times per day Sarita Bottom, APRN - NP   10 mL at 06/23/22 0846    sodium chloride flush 0.9 % injection 5-40 mL  5-40 mL IntraVENous PRN Sarita Bottom, APRN - NP        0.9 % sodium chloride infusion   IntraVENous PRN Sarita Bottom, APRN - NP        acetaminophen (TYLENOL) tablet 650 mg  650 mg Oral Q6H PRN Sarita Bottom, APRN - NP   650 mg at 06/22/22  2259    Or    acetaminophen (TYLENOL) suppository 650 mg  650 mg Rectal Q6H PRN Sarita Bottom, APRN - NP   650 mg at 06/01/22 1230    ondansetron (ZOFRAN) injection 4 mg  4 mg IntraVENous Q6H PRN Sarita Bottom, APRN - NP           ALLERGIES  Allergies   Allergen Reactions    Augmentin [Amoxicillin-Pot Clavulanate] Hives     Tolerated ceftriaxone 06/2022    Codeine      Unknown reaction       IMAGING STUDIES  I personally reviewed the following imaging studies and reports:  CT ABDOMEN PELVIS W IV CONTRAST 06/18/2022    Narrative  EXAM: CT ABDOMEN PELVIS W IV CONTRAST    INDICATION: severe sepsis, hepatic abscess,  evaluate for perforation, abscess    COMPARISON: 06/14/2022    CONTRAST: 100 mL of [id number].    ORAL CONTRAST: None    TECHNIQUE:  Following the uneventful intravenous administration of contrast, thin axial  images were obtained through the abdomen and pelvis. Coronal and sagittal  reconstructions were generated. CT dose reduction was achieved through use of a  standardized protocol tailored for this examination and automatic exposure  control for dose modulation.    FINDINGS:  LOWER THORAX: Bilateral pleural effusions and bilateral lower lobe volume loss  LIVER: Percutaneous drainage of the left hepatic lobe abscess with some residual  fluid noted around the pigtail catheter. Surgical drain is in stable position.  BILIARY TREE: Gallbladder is within normal limits. CBD is not dilated.  SPLEEN: within normal limits.  PANCREAS: Pancreas peripancreatic edema  ADRENALS: Unremarkable.  KIDNEYS: No mass, calculus, or hydronephrosis. Simple cyst left kidney  STOMACH: Unremarkable.  SMALL BOWEL: No dilatation or wall thickening.  COLON: Rectal tube  APPENDIX: Unremarkable  PERITONEUM: No ascites or pneumoperitoneum.  RETROPERITONEUM: Atherosclerotic disease.  REPRODUCTIVE ORGANS: Unremarkable  URINARY BLADDER: Nondistended  BONES: No destructive bone lesion.  ABDOMINAL WALL: No mass or  hernia.  ADDITIONAL COMMENTS: N/A    Impression  1.  Interval increased peripancreatic edema suspicious for pancreatitis.    2.  Small residual fluid collection in the left hepatic lobe status post  percutaneous drainage.    3.  Bilateral pleural effusions unchanged.    4.  Other incidental findings as above       LABS  Lab Results   Component Value Date/Time    WBC 17.8 06/22/2022 02:31 AM    HGB 8.1 06/22/2022 02:31 AM    HCT 26.0 06/22/2022 02:31 AM    PLT 163 06/22/2022 02:31 AM    MCV 102.4 06/22/2022 02:31 AM     Lab Results   Component Value Date/Time    NA 134 06/23/2022 01:12 AM    K 3.5 06/23/2022 01:12 AM    CL 101 06/23/2022 01:12 AM    CO2 26 06/23/2022 01:12 AM    BUN 60 06/23/2022 01:12 AM     Lab Results   Component Value Date/Time    INR 1.6 06/14/2022 06:46 AM       ASSESSMENT / PLAN  Procedure to be performed:   Evaluate liver abscess drainage catheter in IR and possibly upsize drain  Plan for sedation:  No sedation, just Fentanyl if needed for discomfort  Post procedure plan:  observation per protocol  Informed consent:  risks, benefits, and alternatives reviewed with the patient / family who agree to proceed  Code status:  Full Code      Case discussed with Dr. Golden Circle.    Thank you for asking [name] to participate in the care of this patient.      Mariane Burpee, APRN - NP  [facility]      CC:  Bella Kennedy, APRN

## 2022-06-23 NOTE — Wound Image (Addendum)
Wound care nurse for reassessment of the Buttocks / sacrum wound:  Pt. Has gone to IR for drain replacement.  Not in his room.   Nursing reports that the Veriflo Wound Vac is functioning properly and the drainage is tan-ish / cream color mixed with serosanguinous. This may be because the Vashe is cleaning it well. Will continue to monitor the wound vac daily and will change the dressing on Thursday this week.   Unable to see the sacrum wound today. Last photo from the 18th was showing a black eschar on the buttocks.         Plan for treatment of the buttocks:  Santyl ointment to be used to debride the wound now that it has demarcated. Orders placed and discussed with the RN today.   Ronnald Collum, RN, BSN. CWON    Addendum:  Pt. Came back from IR and I got a chance to reassess him and take a photo.   The buttocks / sacrum wound: the eschar  Is fairly thin layer and santyl will debride it.

## 2022-06-23 NOTE — Progress Notes (Addendum)
1900: Bedside and Verbal shift change report given to Cammy Copa, Charity fundraiser (Cabin crew) by Barnett Applebaum, RN (offgoing nurse). Report included the following information Nurse Handoff Report and Cardiac Rhythm Sinus Rhythm .     2000: Complex assessment done, see flowsheet for details. Patient is currently on sinus rhythm, stable blood pressure, afebrile, sats 95% on room air.Comfortably resting in bed, not in respiratory distress.    2104: Patient is requesting medication to help him sleep, he verbalized that he didn't get much sleep last night. Sent a message to [employer], Hospitalist NP thru perfect [employer].     2119: Melatonin 6mg  tablet given once.    0000: Reassessment done, no change to previous assessment.    0315: Labs collected for CBC and BMP.    0400: Reassessment done, no change to previous assessment.    0540: CHG bathed given.    0700: Bedside and verbal report given to Rupali, .

## 2022-06-23 NOTE — Progress Notes (Signed)
Speech Pathology Note    Chart reviewed and SLP treatment attempted, however patient currently OTF in IR per discussion with RN. Will defer dysphagia treatment at this time and continue to follow. Thank you.    Katy Apo, M.S., CCC-SLP

## 2022-06-23 NOTE — Other (Signed)
[facility]  [facility]  DIABETES MANAGEMENT CONSULT    Consulted by Provider for advanced nursing evaluation and care for inpatient blood glucose management.    Evaluation and Action Plan   This 76 year old Caucasian male was admitted with RUQ pain and underwent laparotomy 06/01/22. Was in septic shock with multi-organ failure. Several episodes of Vtach. Remained on vent support, pressors & CRRT. A new finding of liver abscess noted on CT; drain placed with diminishing output. Now drain is being re-evaluated by radiology.    There is concern for LLE viability due to diminished pedal pulses. Ischemia of 3 toes on right & 4 toes on the left.  Per vascular, he was not a candidate for endovascular or surgical revascularization at this time. Moved out of ICU but experienced an acute decline (hypotension, arrhythmia & AMS) 06/14/22 & was returned to ICU. Had to be re-intubated but is now off sedation & pressor support. Infectious disease continues to follow & remains on multiple antibiotics. Feeding by Dobhoff. Podiatry involved & awaiting demarcation of foot wounds before proceeding. Renal issues addressed per nephrology.    As for BG management in this patient with known Type 2 diabetes, treated with oral agent. Has required insulin to offset steroids during this hospital stay and now needs insulin to override TF. Bgs have been in target with 16 units of Lantus insulin since 06/19/22. Amount of CHO per day is being advanced today so will increase insulin.    Management Rationale Action Plan   Medication   TF Running at goal rate of 30cc/hr (115 CHO/D)   Increase Lantus insulin 20 units D    LOW corrective approach     Additional orders               Initial Presentation   Jaime [name] is a 76 y.o. male admitted 05/31/22 from ER after experiencing RUQ pain associated with generalized weakness, nausea & vomiting for several days PTA. AMS+. Hypotension+. Fever+. Dyspneic+. He is visiting from [place].  Ectopic atrial tachycardia  ER exam:  Cardiovascular:      Rate and Rhythm: Regular rhythm. Tachycardia present.   Pulmonary:      Comments: He is tachypneic, coarse breath sounds on inspiration at bilateral bases  Abdominal:      Palpations: Abdomen is soft.      Comments: Tender to palpation diffusely in the right sided abdomen without guarding or rebound tenderness   LAB: WBC 16.6. Normal H&H. Low platelets.BG 272/AG 17. AKI. Elevated liver enzymes. NT pro-BNP 20,980. Lipase >3000.     CXR:   Right IJ catheter satisfactory position without pneumothorax. ET   tube and NG tube as above   CT Head: Negative  CT ABd:   Extraluminal air bubbles in the right upper quadrant concerning for gastric   perforation. Inflammatory changes are noted about the tail of the pancreas   extending into the left anterior pararenal space, please correlate with any   evidence of acute pancreatitis. Bilateral lower lobe infiltrates.     HX: DM    INITIAL DX: Septicemia (HCC) [A41.9]  Gastric perforation (HCC) [K25.5]  Perforated abdominal viscus [R19.8]     Current Treatment     TX: 06/01/22 LAPAROTOMY EXPLORATORY  LAPAROSCOPY DIAGNOSTIC ENDOSCOPY OF ILEAL CONDUIT  EGD    Hospital Course   Clinical progress has been complicated by multi-organ failure.   06/01/22 Dialysis  06/02/22 In septic shock with multi-organ failure. On vent support, sedation,  pressor support and bicarb infusion. On Abx. Two runs of VT this morning requiring defibrillation; cardiology conversation anticipated. CRRT+  06/03/22 Remains on vent support C vent Fi02 50%/Peep 7. Low grade fever; on Abx. Steroids+. Afib/bradycardic. Pressors+. CRRT+.   06/04/22 Remains on vent support and pressors. On Abx. Concern for LLE. CRRT. CT: Enlarged left hepatic abscess contain gas with other areas of probable abscess/phlegmonous infection.  06/05/22 On AC vent support. On Amio, vaso, levo & Fentanyl infusions. OG draining green fluid. CRRT+.  Vascular: Patient remains at risk for  progressive deterioration and limb loss but needs to be weaned from pressor support before intervention is safe.  06/08/22 On Spon vent support; plans to extubate after CT today. Fentanyl pushes. H&H low. CRRT+. Mottled & blue toes bilaterally. Vascular AKI study to be completed  06/09/22 Alert. 02NC. Amio infusion+. Pedal pulses returning (no need to use doppler)  06/10/22 Alert. 02NC. NSR. Off pressors. TF running. Swallow eval today => may begin CL. Undergoing hemodialysis now. Continued cyanosis of bilateral toes.  06/11/22 Alert & conversational today  06/12/22 A&O. Failed swallow; allowed ice chips. Podiatry to see for LE ischemia  06/15/22 Intubated & sedated on pressor support. TF restarted at trickle rate. Dialysis planned per nephrology.  06/16/22 Eyes open. Intubated & sedated on Precedex. ST. Hypotensive on epi infusion. Amio+. TF running @10cc /hr. CVVH+.   06/17/22 Eyes closed. Intubated & sedated on Precedex. Spon vent Fi02 50/Peep 5; trying to wean. NSR. Amio +. TF @goal  rate. CVVH+. Wound care involved  06/18/22 Eyes open. Following conversation. Raising left arm. Intubated on spon vent support & mildly sedated; will try again to wean from vent. TF running at goal rate. May get trached. Blood transfusion+  06/19/22 On sp vent support. Concerning trach. Off pressor. Dobhoff with TF. Anuric. HD today  06/22/22. A&O. Off vent & pressor support. TF infusing without issue via Dobhoff. Speech swallow evaluation today. 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 Case management continues to pursue [facility] in [place] where he lives  06/23/22 A&O. Dobhoff for nutrition; CHO advancing today. Status of toe ischemia unchanged    Diabetes History   Type 2 diabetes on metformin therapy  Checks Bgs and they are in the 100s  Is physically active    Diabetes-related Medical History deferred    Diabetes Medication History - based on PTA  list  Drug class Currently in use   Biguanide [x]  Metformin (Glucophage)  []  Metformin ER (Glucophage XL)     Diabetes self-management practices: deferred  Overall evaluation:    [x]  Unknown A1c     Subjective   "Would you turn the TV on?"     Objective   Physical exam  General Obese male who is in no acute distress  Neuro  Alert & oriented  Vital Signs   Vitals:    06/23/22 1000   BP: 139/71   Pulse: 86   Resp: 17   Temp:    SpO2: 94%     Extremities    Diabetic foot exam:    Left Foot     Visual Exam: Cool & dry. Thickened toenails. 3 toes gangrenous    Pulse DP: +1   Right Foot   Visual Exam: Cool & dry. Thickened toenails . 4 toes gangrenous   Pulse DP: +1      Laboratory  Recent Labs     06/21/22  0343 06/22/22  0231  WBC 19.0* 17.8*   HGB 8.4* 8.1*   HCT 28.0* 26.0*   MCV 102.9* 102.4*   PLT 153 163       Recent Labs     06/21/22  0343 06/22/22  0231 06/23/22  0112   NA 134* 133* 134*   K 3.7 3.7 3.5   CL 101 101 101   CO2 26 25 26    PHOS  --   --  3.3   BUN 53* 76* 60*   CREATININE 2.87* 3.87* 3.35*       Lab Results   Component Value Date    ALT 31 06/22/2022    AST 49 (H) 06/22/2022    ALKPHOS 216 (H) 06/22/2022    BILITOT 1.7 (H) 06/22/2022     No results found for: TSH, TSHREFLEX, TSHFT4, TSHELE, TSH3GEN, TSHHS   Lab Results   Component Value Date    LABA1C 7.7 (H) 06/02/2022     Factors impacting BG management  Factor Dose Comments   Nutrition:  TF   @target  rate of 30cc/hr  (115 CHO/D)   Rate will be increased & patient will receive 173 gm CHO/D   Drug PRBC 06/18/22    Pain Fent PRN    Infection Cefepime Q24 hrs  Flagyl Q12 hrs  Diflucan Q48 hrs WBC abit lower   Other:   Kidney function  Liver function   AKI  Liver enzymes elevated   HD tomorrow     Blood glucose pattern      Significant diabetes-related events  05/31/22 Admission BG 233  06/01/22 Dex 4mg  => BG 100s => HC started  06/02/22 Bgs into 200s  06/03/22 Bgs in 170-180s  06/04/22 Bgs rising abit. HC reduced frequency today. Trickle feeds to  begin  06/05/22 Bgs rising into 200s  06/08/22 Bgs in target  06/09/22 Bgs rising with start of TF at higher rate  06/10/22 Bgs rose abit with TF @30cc /hr  06/11/22 Bgs 108, 113. No insulin given  06/12/22 No insulin given. No nutrition. AKI  06/14/22 Acute decline in health status after dialysis => back to ICU  06/15/22 TF restarted  06/16/22 TF still at 10cc/hr  06/17/22 TF at goal rate. Bgs 190s  06/18/22 Tfs running at goal rate. FBG 183  06/19/22 FBG 170 but higher during the day  06/22/22 Bgs 130-170s  06/23/22 Bgs in acceptable range but CHO load increasing    Assessment and Nursing Intervention   Nursing Diagnosis Risk for unstable blood glucose pattern   Nursing Intervention Domain 5250 Decision-making Support   Nursing Interventions Examined current inpatient diabetes/blood glucose control   Explored factors facilitating and impeding inpatient management  Explored corrective strategies with patient and responsible inpatient provider   Informed patient of rational for insulin strategy while hospitalized     Billing Code(s)   []  99233 IP subsequent hospital care - 50 minutes   [x]  99232 IP subsequent hospital care - 35 minutes   []  99231 IP subsequent hospital care - 25 minutes   []  99221 IP initial hospital care - 40 minutes     Before making these care recommendations, I personally reviewed the hospitalization record, including notes, laboratory & diagnostic data and current medications, and examined the patient at the bedside (circumstances permitting) before determining care. More than fifty (50) percent of the time was spent in patient counseling and/or care coordination.  Total minutes: 35    06/20/22, APRN - CNS  Clinical Nurse Specialist - Diabetes & endocrine disorders  [facility]  Access via [employer]

## 2022-06-24 LAB — BASIC METABOLIC PANEL
Anion Gap: 9 mmol/L (ref 5–15)
BUN: 86 MG/DL — ABNORMAL HIGH (ref 6–20)
Bun/Cre Ratio: 19 (ref 12–20)
CO2: 24 mmol/L (ref 21–32)
Calcium: 8.3 MG/DL — ABNORMAL LOW (ref 8.5–10.1)
Chloride: 100 mmol/L (ref 97–108)
Creatinine: 4.61 MG/DL — ABNORMAL HIGH (ref 0.70–1.30)
Est, Glom Filt Rate: 13 mL/min/{1.73_m2} — ABNORMAL LOW (ref >60)
Glucose: 176 mg/dL — ABNORMAL HIGH (ref 65–100)
Potassium: 3.9 mmol/L (ref 3.5–5.1)
Sodium: 133 mmol/L — ABNORMAL LOW (ref 136–145)

## 2022-06-24 LAB — POCT GLUCOSE
POC Glucose: 157 mg/dL — ABNORMAL HIGH (ref 65–117)
POC Glucose: 186 mg/dL — ABNORMAL HIGH (ref 65–117)
POC Glucose: 173 mg/dL — ABNORMAL HIGH (ref 65–117)
POC Glucose: 157 mg/dL — ABNORMAL HIGH (ref 65–117)
POC Glucose: 146 mg/dL — ABNORMAL HIGH (ref 65–117)

## 2022-06-24 LAB — HEPATIC FUNCTION PANEL
ALT: 25 U/L (ref 12–78)
AST: 41 U/L — ABNORMAL HIGH (ref 15–37)
Albumin/Globulin Ratio: 0.3 — ABNORMAL LOW (ref 1.1–2.2)
Albumin: 1.6 g/dL — ABNORMAL LOW (ref 3.5–5.0)
Alk Phosphatase: 218 U/L — ABNORMAL HIGH (ref 45–117)
Bilirubin, Direct: 1 MG/DL — ABNORMAL HIGH (ref 0.0–0.2)
Globulin: 4.6 g/dL — ABNORMAL HIGH (ref 2.0–4.0)
Total Bilirubin: 1.6 MG/DL — ABNORMAL HIGH (ref 0.2–1.0)
Total Protein: 6.2 g/dL — ABNORMAL LOW (ref 6.4–8.2)

## 2022-06-24 LAB — PHOSPHORUS: Phosphorus: 3.7 MG/DL (ref 2.6–4.7)

## 2022-06-24 LAB — CBC
Hematocrit: 26 % — ABNORMAL LOW (ref 36.6–50.3)
Hemoglobin: 8.2 g/dL — ABNORMAL LOW (ref 12.1–17.0)
MCH: 31.3 PG (ref 26.0–34.0)
MCHC: 31.5 g/dL (ref 30.0–36.5)
MCV: 99.2 FL — ABNORMAL HIGH (ref 80.0–99.0)
MPV: 11.3 FL (ref 8.9–12.9)
Nucleated RBCs: 0 PER 100 WBC
Platelets: 197 10*3/uL (ref 150–400)
RBC: 2.62 M/uL — ABNORMAL LOW (ref 4.10–5.70)
RDW: 19.7 % — ABNORMAL HIGH (ref 11.5–14.5)
WBC: 16.4 10*3/uL — ABNORMAL HIGH (ref 4.1–11.1)
nRBC: 0 10*3/uL (ref 0.00–0.01)

## 2022-06-24 LAB — MAGNESIUM: Magnesium: 2.5 mg/dL — ABNORMAL HIGH (ref 1.6–2.4)

## 2022-06-24 MED ORDER — INSULIN GLARGINE 100 UNIT/ML SC SOLN
100 | Freq: Every evening | SUBCUTANEOUS | Status: DC
Start: 2022-06-24 — End: 2022-06-29
  Administered 2022-06-25 – 2022-06-29 (×5): 18 [IU] via SUBCUTANEOUS

## 2022-06-24 MED ORDER — SODIUM BICARBONATE 650 MG PO TABS
650 MG | Freq: Once | ORAL | Status: AC
Start: 2022-06-24 — End: 2022-06-24
  Administered 2022-06-24: 17:00:00 1 via GASTROSTOMY

## 2022-06-24 MED ORDER — MELATONIN 3 MG PO TABS
3 MG | ORAL | Status: AC
Start: 2022-06-24 — End: 2022-06-23
  Administered 2022-06-24: 01:00:00 6 mg via ORAL

## 2022-06-24 MED ORDER — INSULIN GLARGINE 100 UNIT/ML SC SOLN
100 UNIT/ML | Freq: Every evening | SUBCUTANEOUS | Status: DC
Start: 2022-06-24 — End: 2022-06-24

## 2022-06-24 MED FILL — AMIODARONE HCL 200 MG PO TABS: 200 MG | ORAL | Qty: 1

## 2022-06-24 MED FILL — MELATONIN 3 MG PO TABS: 3 MG | ORAL | Qty: 2

## 2022-06-24 MED FILL — STERILE WATER FOR INJECTION IJ SOLN: INTRAMUSCULAR | Qty: 5

## 2022-06-24 MED FILL — METRONIDAZOLE 500 MG/100ML IV SOLN: 500 MG/100ML | INTRAVENOUS | Qty: 100

## 2022-06-24 MED FILL — CEFEPIME HCL 1 G IJ SOLR: 1 g | INTRAMUSCULAR | Qty: 1000

## 2022-06-24 MED FILL — RETACRIT 3000 UNIT/ML IJ SOLN: 3000 UNIT/ML | INTRAMUSCULAR | Qty: 2

## 2022-06-24 MED FILL — HEPARIN SODIUM (PORCINE) 5000 UNIT/ML IJ SOLN: 5000 UNIT/ML | INTRAMUSCULAR | Qty: 1

## 2022-06-24 MED FILL — RISAQUAD-2 PO CAPS: ORAL | Qty: 1

## 2022-06-24 MED FILL — FLUCONAZOLE IN SODIUM CHLORIDE 200-0.9 MG/100ML-% IV SOLN: INTRAVENOUS | Qty: 100

## 2022-06-24 MED FILL — THERA M PLUS PO TABS: ORAL | Qty: 1

## 2022-06-24 MED FILL — LANTUS 100 UNIT/ML SC SOLN: 100 UNIT/ML | SUBCUTANEOUS | Qty: 1

## 2022-06-24 MED FILL — FAMOTIDINE 20 MG PO TABS: 20 MG | ORAL | Qty: 1

## 2022-06-24 MED FILL — STERILE WATER FOR INJECTION IJ SOLN: INTRAMUSCULAR | Qty: 10

## 2022-06-24 NOTE — Progress Notes (Addendum)
1900: Received bedside and verbal report from Sheral Apley, RN.    2000: Complex assessment done, see flowsheet for details.     0000: Reassessment done, no change to previous assessment. Flushing done with his drains.    0006: Sats 88-89% on room air, patient asleep and mouth breather, hooked to oxygen support via nasal cannula at 2LPM; sats 91-93%.    4174: Labs collected for BMP.    0400: Reassessment done, no change to previous assessment. Patient refused bath, verbalized that he wants to sleep and agreed to have bath at 6am.    0615: Wound vac canister changed. Wound care done on sacrum and toes.    0700: Bedside and verbal report given to Rupali, Charity fundraiser.

## 2022-06-24 NOTE — Progress Notes (Signed)
Attempted to see pt for PT tx. Nursing currently in attempting line placement.

## 2022-06-24 NOTE — Progress Notes (Signed)
Hospitalist Progress Note    NAME:   Jaime [name]   DOB: 1946/09/08   MRN: [id number]     Date/Time: 06/24/2022 5:53 PM  Patient PCP: No primary care provider on file.    Estimated discharge date:8/27  Barriers: Feeding improvement vs need of PEG tube, Nephrology/ ID clearance.Case manager working on transfer to [employer]?       76 y/m with prolonged hospitalization, was admitted to surgical service on 7/31 for perforated hollow viscus underwent emergent laparotomy/ septic shock d/t peritonitis, was intubated and extubated x 2. Also had AKI needing CVVH, now on HD. Also has bilateral foot gangrene. Was transferred to hospitalist service on 8/21.      Assessment / Plan:  Septic shock  D/t perforated viscus  S/p Laparotomy 7/31  Liver abscess s/p IR drainage     Now off pressors  -General surgery and ID following  -Blood culture 7/31 -Culture grew E coli  -Respiratory culture - 8/13- candida ,Wound culture on 8/17- ng , anerobic culture  - 8/18 - ng   -Continue with Cefepime, flagyl, Fluconazole  -Total count slightly better today   -Wound care following  -Liver abscess measuring 10 cm.   -S/p IR drainage on 8/4, culture positive for E coli  -Drain exchange on 8/22   -Has fecal management system, remove once collection decreases     #Acute hypoxemic respiratory failure:  Intubated 7/31, extubation 8/7  -Reintubated 8/13, extubated on 8/19  -Currently on 2 liters [place]     #Dysphagia:  -Ongoing SLP evaluation- advised npo   -Aspiration on FEES study  -Continue with NG tube feeding     #Acute kidney injury:  -Now on HD  -Continue with HD  -Nephrology following     #Bilateral lower extremity critical limb ischemia:  Gangrene both feet:  -Podiatry and vascular surgery following  -Waiting for demarcation - will follow up once demarcation      #Diabetes mellitus:  -Sliding scale and lantus  -Monitor FS  -Diabetes team following     #Sacral wound:  Wound care following    #Goals of care : palliative involved   -Spouse  is working with local [place] [facility] and [facility] [place] on goal of transferring patient to [place] area for next steps in his care, including resources for dialysis and transportation.     Medical Decision Making:   I personally reviewed labs: CBC, BMP  I personally reviewed imaging: na   I personally reviewed EKG: None  Toxic drug monitoring: Monitor creatinine with abx  Discussed case with: RN, patient     Code Status: Full  DVT Prophylaxis: heparin  GI Prophylaxis:none     Subjective:     Chief Complaint / Reason for Physician Visit  Patient admitted for hollow viscus perforation and being managed with wound care . He also had aki - needing hd .     Labs and charts reviewed and patient examined .       Objective:     VITALS:   Last 24hrs VS reviewed since prior progress note. Most recent are:  Patient Vitals for the past 24 hrs:   BP Temp Temp src Pulse Resp SpO2   06/24/22 1745 136/65 -- -- (!) 101 -- --   06/24/22 1730 120/62 -- -- 99 -- --   06/24/22 1715 119/68 -- -- 100 -- --   06/24/22 1700 133/68 -- -- 100 -- --   06/24/22 1645 128/63 -- -- 98 -- --  06/24/22 1630 129/75 -- -- 99 -- --   06/24/22 1615 (!) 141/65 -- -- 97 -- --   06/24/22 1600 130/83 98 F (36.7 C) Oral 97 22 92 %   06/24/22 1545 122/63 -- -- 99 -- --   06/24/22 1530 (!) 122/57 -- -- 96 -- --   06/24/22 1515 (!) 149/60 -- -- 95 -- --   06/24/22 1500 (!) 147/65 -- -- 94 20 94 %   06/24/22 1430 138/67 98 F (36.7 C) -- 95 17 93 %   06/24/22 1300 (!) 152/65 -- -- 94 23 94 %   06/24/22 1200 136/85 98.8 F (37.1 C) Axillary 92 24 91 %   06/24/22 1100 (!) 134/53 -- -- 89 26 93 %   06/24/22 1000 (!) 140/115 -- -- 93 21 94 %   06/24/22 0900 134/67 -- -- 89 20 93 %   06/24/22 0800 (!) 142/89 98.5 F (36.9 C) Oral 92 18 95 %   06/24/22 0700 130/64 -- -- 90 21 95 %   06/24/22 0600 138/64 -- -- 94 22 92 %   06/24/22 0500 (!) 146/68 -- -- (!) 102 21 93 %   06/24/22 0400 (!) 156/66 97.6 F (36.4 C) Oral 99 27 92 %   06/24/22 0300 (!) 148/60 -- -- 99 14 92  %   06/24/22 0200 (!) 156/61 -- -- 97 21 93 %   06/24/22 0100 (!) 144/60 -- -- 95 18 93 %   06/24/22 0000 128/69 98 F (36.7 C) Oral 92 28 92 %   06/23/22 2300 (!) 126/57 -- -- 89 19 94 %   06/23/22 2200 (!) 149/58 -- -- 94 24 94 %   06/23/22 2100 (!) 160/57 -- -- 95 24 95 %   06/23/22 2000 (!) 148/55 97.8 F (36.6 C) Oral 83 20 94 %   06/23/22 1900 (!) 145/55 -- -- 88 17 91 %   06/23/22 1845 -- -- -- 88 17 94 %   06/23/22 1830 -- -- -- 90 17 94 %   06/23/22 1815 -- -- -- 88 16 93 %   06/23/22 1800 (!) 150/56 -- -- 89 16 94 %         Intake/Output Summary (Last 24 hours) at 06/24/2022 1753  Last data filed at 06/24/2022 1600  Gross per 24 hour   Intake 1030 ml   Output 410 ml   Net 620 ml        I had a face to face encounter and independently examined this patient on 06/24/2022, as outlined below:  PHYSICAL EXAM:  General: Alert, cooperative  EENT:  EOMI. Anicteric sclerae.Bilateral jugular catheters ( rt for hd , left cvc )   Resp:  CTA bilaterally, no wheezing or rales.  No accessory muscle use  CV:  Regular  rhythm,  No edema  GI:  Soft, Non distended,Mildly tender.  +Bowel sounds, wound vac in place , drain on rt upper quadrant , fecal drainage system   Neurologic:  Alert and oriented X 3, normal speech,   Psych:   Good insight. Not anxious nor agitated  Skin:  No rashes.  No jaundice  Foot :  bilateral feet - gangrene of first second and third toes , not yet full demarcation     Reviewed most current lab test results and cultures  YES  Reviewed most current radiology test results   YES  Review and summation of old records today    NO  Reviewed patient's current orders and MAR    YES  PMH/SH reviewed - no change compared to H&P  ________________________________________________________________________  Care Plan discussed with:    Comments   Patient x    Family      RN x    Care Manager     Consultant                        Multidiciplinary team rounds were held today with case manager, nursing, pharmacist and  clinical coordinator.  Patient's plan of care was discussed; medications were reviewed and discharge planning was addressed.     ________________________________________________________________________  Total NON critical care TIME:  35  Minutes    Total CRITICAL CARE TIME Spent:   Minutes non procedure based      Comments   >50% of visit spent in counseling and coordination of care     ________________________________________________________________________  Earnstine Regal, MD     Procedures: see electronic medical records for all procedures/Xrays and details which were not copied into this note but were reviewed prior to creation of Plan.      LABS:  I reviewed today's most current labs and imaging studies.  Pertinent labs include:  Recent Labs     06/22/22  0231 06/24/22  0315   WBC 17.8* 16.4*   HGB 8.1* 8.2*   HCT 26.0* 26.0*   PLT 163 197     Recent Labs     06/22/22  0231 06/23/22  0112 06/24/22  0315   NA 133* 134* 133*   K 3.7 3.5 3.9   CL 101 101 100   CO2 25 26 24    GLUCOSE 152* 184* 176*   BUN 76* 60* 86*   CREATININE 3.87* 3.35* 4.61*   CALCIUM 8.1* 8.0* 8.3*   MG  --  2.3 2.5*   PHOS  --  3.3 3.7   LABALBU 1.6*  --  1.6*   BILITOT 1.7*  --  1.6*   AST 49*  --  41*   ALT 31  --  25       Signed: , MD

## 2022-06-24 NOTE — Progress Notes (Signed)
Palliative Medicine  [phone]    Have been following along peripherally  Goals remain clear for full restorative care  Hoping for him to be able to get back to [facility] area    Please call if we need to become actively involved, for now just following peripherally    Lennox Laity, APRN - NP

## 2022-06-24 NOTE — Progress Notes (Signed)
NAME: Jaime [name]        DOB:  08-May-1946        MRN:  [id number]                     Assessment   :                                               Plan:  AKI  Pancreatitis  Hyponatremia  DM  anemia Hd today.  Pull fluid as tolerated.    H/H not at goal.  Continue ESA.    DW RN            Subjective:     Chief Complaint:   No complaints.    Review of Systems:    Symptom Y/N Comments  Symptom Y/N Comments   Fever/Chills    Chest Pain     Poor Appetite    Edema     Cough    Abdominal Pain     Sputum    Joint Pain     SOB/DOE    Pruritis/Rash     Nausea/vomit    Tolerating PT/OT     Diarrhea    Tolerating Diet     Constipation    Other       Could not obtain due to:      Objective:     VITALS:   Last 24hrs VS reviewed since prior progress note. Most recent are:  Vitals:    06/24/22 0900   BP: 134/67   Pulse: 89   Resp: 20   Temp:    SpO2: 93%       Intake/Output Summary (Last 24 hours) at 06/24/2022 1119  Last data filed at 06/24/2022 0700  Gross per 24 hour   Intake 1080 ml   Output 350 ml   Net 730 ml        Telemetry Reviewed:     PHYSICAL EXAM:  General: NAD  1+ edema      Lab Data Reviewed: (see below)    Medications Reviewed: (see below)    PMH/SH reviewed - no change compared to H&P  ________________________________________________________________________  Care Plan discussed with:  Patient     Family      RN     Care Manager                    Consultant:          Comments   >50% of visit spent in counseling and coordination of care       ________________________________________________________________________  Marina Gravel, MD     Procedures: see electronic medical records for all procedures/Xrays and details which  were not copied into this note but were reviewed prior to creation of Plan.      LABS:  Recent Labs     06/22/22  0231 06/24/22  0315   WBC 17.8* 16.4*   HGB 8.1* 8.2*   HCT 26.0* 26.0*   PLT 163 197       Recent Labs      06/22/22  0231 06/23/22  0112 06/24/22  0315   NA 133* 134* 133*   K 3.7 3.5 3.9   CL 101 101 100   CO2 25 26 24    BUN 76* 60* 86*   MG  --  2.3 2.5*  PHOS  --  3.3 3.7       Recent Labs     06/22/22  0231 06/24/22  0315   GLOB 4.5* 4.6*       No results for input(s): INR, APTT in the last 72 hours.    Invalid input(s): PTP   No results for input(s): TIBC, FERR in the last 72 hours.    Invalid input(s): FE, PSAT   No results found for: FOL, RBCF   No results for input(s): PH, PCO2, PO2 in the last 72 hours.  No results for input(s): CPK, CKMB, TROPONINI in the last 72 hours.  No components found for: [facility]  @labua @    MEDICATIONS:  Current Facility-Administered Medications   Medication Dose Route Frequency    insulin glargine (LANTUS) injection vial 18 Units  18 Units SubCUTAneous Nightly    lidocaine 2 % injection 20 mL  20 mL IntraDERmal Once    collagenase ointment   Topical Daily    epoetin alfa-epbx (RETACRIT) injection 6,000 Units  6,000 Units SubCUTAneous Once per day on Mon Wed Fri    famotidine (PEPCID) tablet 20 mg  20 mg Per NG tube Daily    amiodarone (CORDARONE) tablet 200 mg  200 mg Per NG tube Daily    therapeutic multivitamin-minerals 1 tablet  1 tablet Per NG tube Daily    fentaNYL (SUBLIMAZE) injection 25 mcg  25 mcg IntraVENous Q1H PRN    heparin (porcine) injection 5,000 Units  5,000 Units SubCUTAneous BID    fluconazole (DIFLUCAN) in 0.9 % sodium chloride IVPB 200 mg  200 mg IntraVENous Q48H    oxyCODONE (ROXICODONE) immediate release tablet 5 mg  5 mg Oral Q4H PRN    midodrine (PROAMATINE) tablet 5 mg  5 mg Oral Daily PRN    insulin lispro (HUMALOG) injection vial 0-4 Units  0-4 Units SubCUTAneous 4 times per day    cefepime (MAXIPIME) 1,000 mg in sodium chloride 0.9 % 50 mL IVPB (mini-bag)  1,000 mg IntraVENous Q24H    metronidazole (FLAGYL) 500 mg in 0.9% NaCl 100 mL IVPB premix  500 mg IntraVENous Q12H    acidophilus probiotic capsule 1 capsule  1 capsule Oral Daily    alcohol 62%  (NOZIN) nasal sanitizer 1 ampule  1 ampule Topical BID    bisacodyl (DULCOLAX) suppository 10 mg  10 mg Rectal Daily PRN    albumin human 25% IV solution 25 g  25 g IntraVENous PRN    heparin (porcine) injection 1,200 Units  1,200 Units IntraCATHeter PRN    And    heparin (porcine) injection 1,000 Units  1,000 Units IntraCATHeter PRN    sodium chloride 0.9 % bolus 100 mL  100 mL IntraVENous PRN    balsum peru-castor oil (VENELEX) ointment   Topical BID    glucose chewable tablet 16 g  4 tablet Oral PRN    dextrose bolus 10% 125 mL  125 mL IntraVENous PRN    Or    dextrose bolus 10% 250 mL  250 mL IntraVENous PRN    glucagon (rDNA) injection 1 mg  1 mg SubCUTAneous PRN    dextrose 10 % infusion   IntraVENous Continuous PRN    sodium chloride flush 0.9 % injection 5-40 mL  5-40 mL IntraVENous 2 times per day    sodium chloride flush 0.9 % injection 5-40 mL  5-40 mL IntraVENous PRN    0.9 % sodium chloride infusion   IntraVENous PRN    acetaminophen (TYLENOL) tablet  650 mg  650 mg Oral Q6H PRN    Or    acetaminophen (TYLENOL) suppository 650 mg  650 mg Rectal Q6H PRN    ondansetron (ZOFRAN) injection 4 mg  4 mg IntraVENous Q6H PRN

## 2022-06-24 NOTE — Progress Notes (Addendum)
[facility]  [facility]    Progress Note    Date:06/24/2022       Room:2522/01  Patient Name:Jaime [name]     Date of Birth:[birth year]     Age:76 y.o.    Subjective    Subjective   Pt seen at [facility] in the ICU. No new complaints. Per nursing, no acute events      Review of Systems  Unable to accurately obtain due to current medical status      Objective         Vitals Last 24 Hours:  TEMPERATURE:  Temp  Avg: 98.4 F (36.9 C)  Min: 98 F (36.7 C)  Max: 99.4 F (37.4 C)  RESPIRATIONS RANGE: Resp  Avg: 23.8  Min: 17  Max: 32  PULSE OXIMETRY RANGE: SpO2  Avg: 92.9 %  Min: 88 %  Max: 96 %  PULSE RANGE: Pulse  Avg: 96.5  Min: 89  Max: 102  BLOOD PRESSURE RANGE: Systolic (24hrs), Avg:134 , Min:119 , Max:152   ; Diastolic (24hrs), Avg:69, Min:53, Max:115    I/O (24Hr):    Intake/Output Summary (Last 24 hours) at 06/25/2022 0824  Last data filed at 06/25/2022 0700  Gross per 24 hour   Intake 1630 ml   Output 3085 ml   Net -1455 ml     Objective  GEN: Pt in NAD. Heels elevated. No family at [facility]  DERM:Bilateral toes 1-10 discoloration with gangrenous changes. No proximal lymphatic streaking. No drainage or malodor  VASC: Pedal pulses (DP/PT) diminished to B/L LE. Skin temp is warm to cool from proximal to distal for B/L LE. Neg homans/pratts signs to B/L LE. No varicosities or telangectasias noted to B/L LE.  NEURO: Protective and epicritic sensations grossly intact to B/L LE  MSK: (-) POP, No gross deformities. Good muscle tone and bulk noted to B/L LE.  PSYCH: Sedated      Labs/Imaging/Diagnostics    Labs:  CBC:  Recent Labs     06/24/22  0315   WBC 16.4*   RBC 2.62*   HGB 8.2*   HCT 26.0*   MCV 99.2*   RDW 19.7*   PLT 197     CHEMISTRIES:  Recent Labs     06/23/22  0112 06/24/22  0315 06/25/22  0314   NA 134* 133* 134*   K 3.5 3.9 4.2   CL 101 100 101   CO2 26 24 26    BUN 60* 86* 65*   CREATININE 3.35* 4.61* 3.69*   GLUCOSE 184* 176* 188*   PHOS 3.3 3.7  --    MG 2.3 2.5*  --       PT/INR:No results for input(s): PROTIME, INR in the last 72 hours.  APTT:No results for input(s): APTT in the last 72 hours.  LIVER PROFILE:  Recent Labs     06/24/22  0315   AST 41*   ALT 25   BILIDIR 1.0*   BILITOT 1.6*   ALKPHOS 218*       Imaging Last 24 Hours:  IR TUBE/CATH CHANGE W CONTRAST    Result Date: 06/23/2022  PROCEDURE: Fluoroscopic guided left hepatic drainage catheter check and exchange INDICATION: Hepatic abscess OPERATING PHYSICIAN: 06/25/2022, M.D. ESTIMATED BLOOD LOSS: Minimal SPECIMENS REMOVED: None FLUORO TIME: 1.1 min AIR KERMA: 17.5 mGy COMPLICATIONS: None immediate. MEDICATIONS: Moderate intravenous conscious sedation was neither intended nor achieved. Local anesthesia was achieved with 1% lidocaine, and systemic analgesia with 50 mg  fentanyl. INTRAPROCEDURE TIME: 15 minutes TECHNIQUE AND FINDINGS: Informed written consent was obtained. The patient was then brought to the procedure suite, placed in the Supine position, and a timeout was performed. The right upper quadrant and indwelling drainage catheter were then prepped and draped in standard sterile fashion. A scout image was obtained, demonstrating the catheter to be in its expected position. Contrast was then gently injected into the catheter, demonstrating catheter patency. The pocket around the catheter remained moderately large. Multiple images in different obliquities were obtained to confirm this. Additionally, no communication to the biliary system was identified. Therefore, the decision was made to exchange the catheter.  The skin was anesthetized with 1% lidocaine, the catheter tip unformed, and the catheter was removed over a short Amplatz wire. Following this, a new 30 French drain was advanced over the wire, and its distal tip formed in the targeted pocket.  The wire was removed and the pigtail was locked. Brief contrast injection was then performed to confirm catheter tip placement. The catheter was sutured into place  with a 2-0 silk stitch.  A sterile dressing was placed, and the catheter was connected to suction drainage. The patient tolerated the procedure well without any complication and was transferred to the recovery area in stable condition.     Fluoroscopic guided left hepatic abscess drainage catheter check and exchange for a new 14 French drain.       Assessment//Plan           Hospital Problems             Last Modified POA    * (Principal) Gastric perforation (HCC) 06/02/2022 Yes    Type 2 diabetes mellitus with hyperglycemia, without long-term current use of insulin (HCC) 06/23/2022 Yes    Intestinal obstruction (HCC) 06/05/2022 Yes    Severe sepsis (HCC) 06/05/2022 Yes    Septic shock (HCC) 06/08/2022 Yes    E coli infection 06/22/2022 Yes    Liver abscess 06/05/2022 Yes    Pneumoperitoneum 06/05/2022 Yes    Acute respiratory failure with hypoxia (HCC) 06/05/2022 Yes    AKI (acute kidney injury) (HCC) 06/05/2022 Yes    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 Yes    Thrombocytopenia (HCC) 06/05/2022 Yes    E coli bacteremia 06/08/2022 Yes    Hepatic abscess 06/08/2022 Yes    Peripheral arterial disease (HCC) 06/08/2022 Yes    Hyperbilirubinemia 06/08/2022 Yes    Gram negative sepsis (HCC) 06/08/2022 Yes    Septicemia (HCC) 06/09/2022 Yes    Aspiration pneumonia of both lower lobes (HCC) 06/12/2022 Yes    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 No new wound formation noted to the feet. The demarcation process is slow but pt has progressed  - Cont local wound care and offloading while in bed  - We will continue to monitor patient and update surgical plan if needed     WB Status: As tolerated for  transfers to B/L LE  Wound Care: Paint with all toes with betadine daily  Erionna Strum A. Kairav Russomanno, DPM, CWSP, AACFAS    [facility]  [address] [place], [place] [address]  O: [phone]  F: [phone]     [facility] (Opening Sept 2023)  [address] [place]  [place], [place] [address]  O: [phone]  F: [phone]     [facility]  [address]  [place], [place] [address]  O: [phone]  F: [phone]     * Available via [facility] 24/7      Electronically signed by Rosana Fret, DPM on 06/25/22 at 8:24 AM EDT

## 2022-06-24 NOTE — Plan of Care (Signed)
Speech LAnguage Pathology TREATMENT    Patient: Jaime [name] (76 y.o. male)  Date: 06/24/2022  Primary Diagnosis: Septicemia ([place]) [A41.9]  Gastric perforation ([place]) [K25.5]  Perforated abdominal viscus [R19.8]  Procedure(s) (LRB):  LAPAROTOMY EXPLORATORY (N/A)  LAPAROSCOPY DIAGNOSTIC (N/A)  ENDOSCOPY OF ILEAL CONDUIT (N/A) 23 Days Post-Op   Precautions: Aspiration, Fall Risk                  ASSESSMENT :  Therapy targeted pharyngeal dysphagia on this date. Note repeat FEES completed 8/21 revealing significant pharyngeal dysphagia characterized by delayed swallow initiation, incomplete and abbreviated epiglottic inversion, and reduced pharyngeal squeeze and bolus clearance. NPO was recommended as aspiration observed with thin liquids and mildly thick liquids as well as significant pharyngeal residue both in valleculae and pyriform sinuses seen after swallow. Patient observed with consistent coughing following ice chip and tsp of thin liquid trials, resulting in occasional expectoration of pharyngeal level secretions in which Yankauer was utilized to clear. Patient educated re: effortful swallow to increase pharyngeal strength. Patient benefited from moderate cues for task completion on this date. Further trials deferred as patient's drowsiness increasing and patient requesting to sleep. SLP to continue to follow.    Patient will benefit from skilled intervention to address the above impairments.     PLAN :  Recommendations and Planned Interventions:  Diet: NPO and ice chips  -- Dobhoff for primary nutrition/hydration/medications  -- Oral care 3-4x/daily  -- SLP to continue to follow for dysphagia       Acute SLP Services: Yes, SLP will continue to follow per plan of care.    Discharge Recommendations: Yes, recommend SLP treatment at next level of care     SUBJECTIVE:   Patient stated he wanted to sleep.    OBJECTIVE:   History reviewed. No pertinent past medical history.  Past Surgical History:   Procedure  Laterality Date    BLADDER SURGERY N/A 06/01/2022    ENDOSCOPY OF ILEAL CONDUIT performed by Melene Plan, MD at [facility] MAIN OR    CT VISCERAL PERCUTANEOUS DRAIN  06/05/2022    CT VISCERAL PERCUTANEOUS DRAIN 06/05/2022 [facility] RAD CT    LAPAROSCOPY N/A 06/01/2022    LAPAROSCOPY DIAGNOSTIC performed by Melene Plan, MD at [facility] MAIN OR    LAPAROTOMY N/A 06/01/2022    LAPAROTOMY EXPLORATORY performed by Melene Plan, MD at [facility] MAIN OR     Prior Level of Function/Home Situation:   Social/Functional History  Lives With: Spouse  Type of Home: House  Home Layout: One level  Home Equipment: None  ADL Assistance: Independent  Ambulation Assistance: Independent  Transfer Assistance: Independent  Active Driver: Yes    Cognitive and Communication Status:  Neurologic State: Drowsy  Orientation Level: Oriented x4  Cognition: Follows commands    Dysphagia:  P.O. Trials:  PO Trials  Assessment Method(s): Observation  Patient Position: Upright in bed  Vocal Quality: No Impairment  Consistency Presented: Ice Chips;Thin  How Presented: SLP-fed/Presented;Spoon  Bolus Acceptance: No impairment  Bolus Formation/Control: No impairment  Propulsion: No impairment  Oral Residue: None  Aspiration Signs/Symptoms: Strong cough;Material suctioned        Effortful swallow x5 given moderate cues    Respiratory Status/Airway:  Room air                         After treatment:   Patient left in no apparent distress in bed, Call bell left within reach, and Nursing notified  COMMUNICATION/EDUCATION:   Patient was educated regarding role of SLP and SLP POC. He demonstrated Good understanding as evidenced by Nodding.    The patient's plan of care including recommendations, planned interventions, and recommended diet changes were discussed with: Registered nurse    Thank you,  Lucius Conn, SLP  Minutes: 15     Problem: SLP Adult - Impaired Swallowing  Goal: By Discharge: Advance to least restrictive diet without signs or symptoms of aspiration for planned discharge  setting.  See evaluation for individualized goals.  Description: Speech Pathology Goals  1. Patient will participate in FEES to objectively assess swallow function prior to PO diet initiation. Goal initiated 06/22/22. Goal met 06/22/22.   2. Patient will tolerate therapeutic PO trials of thin liquids (e.g., ice chips, small sips of water) and purees without clinical s/s of aspiration or respiratory decline. Goal initiated 06/22/22.  3. Patient will complete pharyngeal strengthening exercises (e.g., effortful swallow) with min cues for technique. Goal initiated 06/22/22.   4. Patient will complete repeat instrumental swallow study to determine readiness for PO diet when clinically appropriate. Goal initiated 06/22/22.   Outcome: Progressing

## 2022-06-24 NOTE — Plan of Care (Signed)
Problem: Safety - Adult  Goal: Free from fall injury  Outcome: Progressing     Problem: Respiratory - Adult  Goal: Achieves optimal ventilation and oxygenation  Outcome: Progressing     Problem: Neurosensory - Adult  Goal: Achieves maximal functionality and self care  Outcome: Progressing     Problem: Cardiovascular - Adult  Goal: Maintains optimal cardiac output and hemodynamic stability  Outcome: Progressing     Problem: Skin/Tissue Integrity - Adult  Goal: Skin integrity remains intact  Outcome: Progressing

## 2022-06-24 NOTE — Progress Notes (Addendum)
0700- Bedside and Verbal shift change report given to Ronni Osterberg RN (oncoming nurse) by Idell Pickles RN (offgoing nurse). Report included the following information Nurse Handoff Report, Index, Intake/Output, MAR, and Recent Results.     0730- Tried to perform skin check on patient with off going Charity fundraiser. Patient is very agitated and angry. Patient refused to get any care at this point and wanted to be left alone. CCL tried US guided IV on patient. Patient is very upset since PICC team already had an unsuccessful attempt. Rn Convinced the patient that he will be left along for an hour or so to give him a break and will check upon patient later again. Patient agreed. CCL.Unable to perform dual skin check at the shift change.notified.    0800- Shift assessment completed. Patient is A&Ox4 with periods of confusion, following commands well. 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 Patient is anuric; bial buttocks DTI and sacral wound present, bial toes necrotic.     1053- Speech at the bedside.     1415- Dialysis RN at the bedside to perform HD.     1930- Bedside and Verbal shift change report given to Abby G RN (oncoming nurse) by Rella Larve RN  (offgoing nurse). Report included the following information Index, Intake/Output, MAR, and Recent Results.

## 2022-06-24 NOTE — Progress Notes (Signed)
Palliative Medicine  Orlen Leedy is a 76 y.o. with a past history of HTN, HLD, and DM2, who was admitted on 05/31/2022 while traveling in [place] for Nascar race, with a diagnosis of Perforated Viscus seen on CT, Septic Shock, and AKI.     Code Status: Full Code     Advance Care Planning:  Demographics 06/08/2022   Marital Status Married    No AMD on file. Spouse is legal nok.     Patient / Family Encounter Documentation     Participants (names):  Odella Aquas, Natalia Leatherwood, [place]     Narrative: LCSW consulted with assigned nurses McKenzie and Rupali, who reported patient had been agitated and angry earlier today, but has calmed down. This Clinical research associate reviewed chart and checked in on patient, who was alert and oriented, breathing comfortably on room air, in no apparent distress. He did share that he is "worried about my brother" who has intellectual disabilities and who patient and his spouse have been supporting.  This Clinical research associate provided caring presence, empathetic listening, held his hand, and encouraged him to try to relax and trust that Susie and their strong network of friends, are looking out for [employer]. He shared that he is tired, so encouraged him to rest and to ask his nurse to call Susie when he wakes up.  Serita Kyle said she would facilitate call to spouse.    Palliative SW will continue to follow to offer support to patient and family during his hospitalization.     Psychosocial Issues Identified/ Resilience Factors:   Patient is a veteran of the [name]. He is retired now and has VA benefits, but a high co-pay for his medication due to his income.  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 His wife uses O2 and has a Designer, jewellery. She ambulates in a wheelchair. She is buying groceries for [name] and paying bills while patient is hospitalized.     Patient and spouse celebrated 60 years of marriage on August 20. They usually go out to eat on their anniversary, but also  they don't wait for special occasions to go out. His best friend is Financial planner. They travel to about 8 Nascar races a year, and Karren Burly was with patient on his trip to [place] and urged him to go to the hospital, for which patient is grateful.     Caregiver Burden: High  Does the caregiver feel confident administering medication? Not discussed.  Does the caregiver need any help connecting with community resources? Yes  Does the caregiver feel confident assisting with activities of daily living? No.     Goals of Care / Plan:   Patient's symptoms will be managed.  Patient wants to transfer as soon as possible to a ([facility]) hospital in [place], [place].  Spouse now has his wedding ring, which was tight on his finger and removed by ICU nurse.  Spouse is working with local [place] [facility] and [facility] CM Aurea Graff on goal of transferring patient to [place] area for next steps in his care, including resources for dialysis and transportation.  Patient will be offered the opportunity to complete an AMD if he is interested and able during this hospitalization.  Palliative team will continue to provide education and support as appropriate to patient and family.     Thank you for including Palliative Medicine in the care of Mr. Michelle Vanhise.     Natalia Leatherwood, LCSW  [phone])

## 2022-06-24 NOTE — Progress Notes (Signed)
Occupational Therapy    Attempted to see pt for OT tx this AM. Nursing was in attempting line placement. OT deferred. Will continue to follow.

## 2022-06-24 NOTE — Other (Addendum)
Jaime [name]  [facility]  DIABETES MANAGEMENT CONSULT    Consulted by Provider for advanced nursing evaluation and care for inpatient blood glucose management.    Evaluation and Action Plan   This 76 year old Caucasian male was admitted with RUQ pain and underwent laparotomy 06/01/22. Was in septic shock with multi-organ failure. Several episodes of Vtach. Remained on vent support, pressors & CRRT. A new finding of liver abscess noted on CT; drain placed with diminishing output. Now drain is being re-evaluated by radiology.    There is concern for LLE viability due to diminished pedal pulses. Ischemia of 3 toes on right & 4 toes on the left.  Per vascular, he was not a candidate for endovascular or surgical revascularization at this time. Moved out of ICU but experienced an acute decline (hypotension, arrhythmia & AMS) 06/14/22 & was returned to ICU. Had to be re-intubated but is now off sedation & pressor support. Infectious disease continues to follow & remains on multiple antibiotics. Feeding by Dobhoff. Podiatry involved & awaiting demarcation of foot wounds before proceeding. Renal issues addressed per nephrology.    As for BG management in this patient with known Type 2 diabetes, treated with oral agent. Has required insulin to offset steroids during this hospital stay and now needs insulin to override TF. Bgs have been in target with 16 units of Lantus insulin since 06/19/22. Amount of CHO per day was advanced yesterday 06/23/22 so insulin dose increased today.    Management Rationale Action Plan   Medication   TF Running at goal rate of 30cc/hr (173 CHO/D)   Lantus insulin 18 units D    LOW corrective approach     Additional orders               Initial Presentation   Jaime [name] is a 76 y.o. male admitted 05/31/22 from ER after experiencing RUQ pain associated with generalized weakness, nausea & vomiting for several days PTA. AMS+. Hypotension+. Fever+. Dyspneic+. He is visiting from [place].  Ectopic atrial tachycardia  ER exam:  Cardiovascular:      Rate and Rhythm: Regular rhythm. Tachycardia present.   Pulmonary:      Comments: He is tachypneic, coarse breath sounds on inspiration at bilateral bases  Abdominal:      Palpations: Abdomen is soft.      Comments: Tender to palpation diffusely in the right sided abdomen without guarding or rebound tenderness   LAB: WBC 16.6. Normal H&H. Low platelets.BG 272/AG 17. AKI. Elevated liver enzymes. NT pro-BNP 20,980. Lipase >3000.     CXR:   Right IJ catheter satisfactory position without pneumothorax. ET   tube and NG tube as above   CT Head: Negative  CT ABd:   Extraluminal air bubbles in the right upper quadrant concerning for gastric   perforation. Inflammatory changes are noted about the tail of the pancreas   extending into the left anterior pararenal space, please correlate with any   evidence of acute pancreatitis. Bilateral lower lobe infiltrates.     HX: DM    INITIAL DX: Septicemia (HCC) [A41.9]  Gastric perforation (HCC) [K25.5]  Perforated abdominal viscus [R19.8]     Current Treatment     TX: 06/01/22 LAPAROTOMY EXPLORATORY  LAPAROSCOPY DIAGNOSTIC ENDOSCOPY OF ILEAL CONDUIT  EGD    Hospital Course   Clinical progress has been complicated by multi-organ failure.   06/01/22 Dialysis  06/02/22 In septic shock with multi-organ failure. On vent support, sedation,  pressor support and bicarb infusion. On Abx. Two runs of VT this morning requiring defibrillation; cardiology conversation anticipated. CRRT+  06/03/22 Remains on vent support C vent Fi02 50%/Peep 7. Low grade fever; on Abx. Steroids+. Afib/bradycardic. Pressors+. CRRT+.   06/04/22 Remains on vent support and pressors. On Abx. Concern for LLE. CRRT. CT: Enlarged left hepatic abscess contain gas with other areas of probable abscess/phlegmonous infection.  06/05/22 On AC vent support. On Amio, vaso, levo & Fentanyl infusions. OG draining green fluid. CRRT+.  Vascular: Patient remains at risk for  progressive deterioration and limb loss but needs to be weaned from pressor support before intervention is safe.  06/08/22 On Spon vent support; plans to extubate after CT today. Fentanyl pushes. H&H low. CRRT+. Mottled & blue toes bilaterally. Vascular AKI study to be completed  06/09/22 Alert. 02NC. Amio infusion+. Pedal pulses returning (no need to use doppler)  06/10/22 Alert. 02NC. NSR. Off pressors. TF running. Swallow eval today => may begin CL. Undergoing hemodialysis now. Continued cyanosis of bilateral toes.  06/11/22 Alert & conversational today  06/12/22 A&O. Failed swallow; allowed ice chips. Podiatry to see for LE ischemia  06/15/22 Intubated & sedated on pressor support. TF restarted at trickle rate. Dialysis planned per nephrology.  06/16/22 Eyes open. Intubated & sedated on Precedex. ST. Hypotensive on epi infusion. Amio+. TF running @10cc /hr. CVVH+.   06/17/22 Eyes closed. Intubated & sedated on Precedex. Spon vent Fi02 50/Peep 5; trying to wean. NSR. Amio +. TF @goal  rate. CVVH+. Wound care involved  06/18/22 Eyes open. Following conversation. Raising left arm. Intubated on spon vent support & mildly sedated; will try again to wean from vent. TF running at goal rate. May get trached. Blood transfusion+  06/19/22 On sp vent support. Concerning trach. Off pressor. Dobhoff with TF. Anuric. HD today  06/22/22. A&O. Off vent & pressor support. TF infusing without issue via Dobhoff. Speech swallow evaluation today. 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 Case management continues to pursue [facility] in [place] where he lives  06/23/22 A&O. Dobhoff for nutrition; CHO advancing today. Status of toe ischemia unchanged  06/24/22 Per nursing, "Patient is very agitated and angery. Patient refused to get any care at this point and wanted to be left alone. CCL tried 06/25/22 guided IV on patient. Patient is very upset since PICC team already  had an unsuccessful attempt. Rn Convinced the patient that he will be left along for an hour or so to give him a break and will check upon patient later again. Patient agreed."    Diabetes History   Type 2 diabetes on metformin therapy  Checks Bgs and they are in the 100s  Is physically active    Diabetes-related Medical History deferred    Diabetes Medication History - based on PTA list  Drug class Currently in use   Biguanide [x]  Metformin (Glucophage)  []  Metformin ER (Glucophage XL)     Diabetes self-management practices: deferred  Overall evaluation:    [x]  Unknown A1c     Subjective   NA     Objective   Physical exam  General Obese male who is in no acute distress  Neuro  Alert & oriented  Vital Signs   Vitals:    06/24/22 0900   BP: 134/67   Pulse: 89   Resp: 20   Temp:    SpO2: 93%     Extremities    Diabetic  foot exam:    Left Foot     Visual Exam: Cool & dry. Thickened toenails. 3 toes gangrenous    Pulse DP: +1   Right Foot   Visual Exam: Cool & dry. Thickened toenails . 4 toes gangrenous   Pulse DP: +1      Laboratory  Recent Labs     06/22/22  0231 06/24/22  0315   WBC 17.8* 16.4*   HGB 8.1* 8.2*   HCT 26.0* 26.0*   MCV 102.4* 99.2*   PLT 163 197       Recent Labs     06/22/22  0231 06/23/22  0112 06/24/22  0315   NA 133* 134* 133*   K 3.7 3.5 3.9   CL 101 101 100   CO2 25 26 24    PHOS  --  3.3 3.7   BUN 76* 60* 86*   CREATININE 3.87* 3.35* 4.61*       Lab Results   Component Value Date    ALT 25 06/24/2022    AST 41 (H) 06/24/2022    ALKPHOS 218 (H) 06/24/2022    BILITOT 1.6 (H) 06/24/2022     No results found for: TSH, TSHREFLEX, TSHFT4, TSHELE, TSH3GEN, TSHHS   Lab Results   Component Value Date    LABA1C 7.7 (H) 06/02/2022     Factors impacting BG management  Factor Dose Comments   Nutrition:  TF   @target  rate of 30cc/hr  (173 CHO/D)    Drug  EPO     PRBC 06/18/22   A1cs inaccurate   Pain Fent PRN    Infection Cefepime Q24 hrs  Flagyl Q12 hrs  Diflucan Q48 hrs WBC elevated   Other:   Kidney  function  Liver function   AKI  Liver enzymes elevated   HD today     Blood glucose pattern      Significant diabetes-related events  05/31/22 Admission BG 233  06/01/22 Dex 4mg  => BG 100s => HC started  06/02/22 Bgs into 200s  06/03/22 Bgs in 170-180s  06/04/22 Bgs rising abit. HC reduced frequency today. Trickle feeds to begin  06/05/22 Bgs rising into 200s  06/08/22 Bgs in target  06/09/22 Bgs rising with start of TF at higher rate  06/10/22 Bgs rose abit with TF @30cc /hr  06/11/22 Bgs 108, 113. No insulin given  06/12/22 No insulin given. No nutrition. AKI  06/14/22 Acute decline in health status after dialysis => back to ICU  06/15/22 TF restarted  06/16/22 TF still at 10cc/hr  06/17/22 TF at goal rate. Bgs 190s  06/18/22 Tfs running at goal rate. FBG 183  06/19/22 FBG 170 but higher during the day  06/22/22 Bgs 130-170s  06/23/22 Bgs in acceptable range but CHO load increasing  06/24/22 Bgs 150-180s    Assessment and Nursing Intervention   Nursing Diagnosis Risk for unstable blood glucose pattern   Nursing Intervention Domain 5250 Decision-making Support   Nursing Interventions Examined current inpatient diabetes/blood glucose control   Explored factors facilitating and impeding inpatient management  Explored corrective strategies with patient and responsible inpatient provider   Informed patient of rational for insulin strategy while hospitalized     Billing Code(s)   []  99233 IP subsequent hospital care - 50 minutes   []  06/21/22 IP subsequent hospital care - 35 minutes   [x]  99231 IP subsequent hospital care - 25 minutes   []  99221 IP initial hospital care - 40 minutes     Before  making these care recommendations, I personally reviewed the hospitalization record, including notes, laboratory & diagnostic data and current medications, and examined the patient at the bedside (circumstances permitting) before determining care. More than fifty (50) percent of the time was spent in patient counseling and/or care coordination.  Total minutes:  25    Bess Harvest, APRN - CNS  Clinical Nurse Specialist - Diabetes & endocrine disorders  [facility]  Access via [employer]

## 2022-06-24 NOTE — Other (Signed)
Primary RN SBAR: R. Dakwe, RN  Patient Education: procedural / infection control  Hepatitis B Surface Ag   Date/Time Value Ref Range Status   06/01/2022 10:08 PM <0.10 Index Final     Hep B S Ab   Date/Time Value Ref Range Status   06/01/2022 10:08 PM <3.10 mIU/mL Final          06/24/22 1430   Vital Signs   BP 138/67   Temp 98 F (36.7 C)   Pulse 95   Respirations 17   SpO2 93 %   Treatment   Time On 1515   Treatment Goal Uf 2 kg as tol   Observations & Evaluations   Level of Consciousness 0   Oriented X 3-4   Heart Rhythm   (ICU monitoring)   Respiratory Quality/Effort Labored   O2 Device None (Room air)   Skin Condition/Temp Warm;Dry   Edema Generalized +2   RUE Edema +2   LUE Edema +2   RLE Edema +2   LLE Edema +2   Technical Checks   Dialysis Machine No. [id number]   RO Machine Number [id number]   All Connections Secure Yes   NS Bag Yes   Saline Line Double Clamped Yes   Dialyzer Revaclear 300   Prime Volume (mL) 200 mL   ICEBOAT I;C;E;B;O;A;T   RO Machine Log Sheet Completed Yes   Machine Alarm Self Test Completed;Passed   Scientific laboratory technician (Neg) Yes   Bath Temperature 98.6 F (37 C)   Treatment Initiation   Dialyze Hours 3   Treatment  Initiation Universal Precautions maintained;Lines secured to patient;Connections secured;Prime given;Venous Parameters set;Arterial Parameters set;Air foam detector engaged;Dialysate;Saline line double clamped;Revaclear Dialyzer   Dialysis Bath   K+ (Potassium) 4   Ca+ (Calcium) 2.5   Na+ (Sodium) 140   HCO3 (Bicarb) 35   Bicarbonate Concentrate Lot No. [id number]   Acid Concentrate Lot No. [id number]   Hemodialysis Central Access Right Neck   Placement Date/Time: 06/09/22 1830   Present on Admission/Arrival: No  Inserted by: dr. Rutherford Limerick  Orientation: Right  Access Location: Neck   Continued need for line? Yes   Site Assessment Clean, dry & intact   Venous Lumen Status Brisk blood  return;Flushed;Infusing   Arterial Lumen Status Brisk blood return;Flushed;Infusing   Alcohol Cap Used No   Line Care Connections checked and tightened;Chlorhexidine wipes;Ports disinfected   Dressing Type Transparent   Date of Last Dressing Change 06/23/22   Dressing Status Clean, dry & intact   Dressing Change Due 06/26/22       1515 HD treatment initiated per physicians order.  1815 HD treatment completed per physician order. All possible blood returned to patient. Each catheter limb disinfected for 60 seconds per limb with alcohol swabs. Dialysis CVC hub scrubbed with Prevantics for 5 seconds, followed by a 5 second dry time per Hospital P&P.   +flushed/+heplock/+capped.       06/24/22 1815   Vital Signs   BP (!) 148/67   Temp 98 F (36.7 C)   Pulse 100   Respirations 23   SpO2 95 %   Post-Hemodialysis Assessment   Post-Treatment Procedures Blood returned;Catheter capped, clamped and heparinized x 2 ports   Machine Disinfection Process Acid/Vinegar Clean;Heat Disinfect;Exterior Engineer, agricultural Volume (ml) 200 ml   Blood Volume Processed (Liters) 54.4 L   Dialyzer Clearance Lightly streaked   Duration of Treatment (  minutes) 180 minutes   Heparin Amount Administered During Treatment (mL) 0 mL   Hemodialysis Intake (ml) 500 ml   Hemodialysis Output (ml) 2500 ml   NET Removed (ml) 2000   Tolerated Treatment Good   Patient Response to Treatment Well   Edema Generalized   Time Off 1815   Patient Disposition Remain in ICU/ED     Primary RN SBAR: R. Dakwe, RN  Comments: Patient in no apparent distress, bed in low position and call light within reach.

## 2022-06-24 NOTE — Progress Notes (Signed)
Physician Progress Note      PATIENTADAN, BAEHR  CSN #:                  [id number]  DOB:                       22-Sep-1946  ADMIT DATE:       05/31/2022 11:25 PM  DISCH DATE:  RESPONDING  PROVIDER #:        Earnstine Regal MD          QUERY TEXT:    Pt admitted on 7/31 with Septic shock D/t perforated viscus.    Noted documentation of Aspiration pneumonia by ordered podiatry consultant   (8/21 pn).    Please document in progress notes and discharge summary:      The medical record reflects the following:    Risk Factors: per ED pn cc was nausea and vomiting with generalized weakness.   "He reports cough and shortness of breath" (ED pn)    Clinical Indicators:  Blood culture 7/31 Culture grew E coli    7/31 CXR: Bilateral lower lobe atelectasis versus infiltrates.    8/11 CXR "Stable to slightly worsened bilateral lung infiltrates and pleural   effusions"    8/13 Chest CT: Stable to slightly worsened bilateral lung infiltrates and   pleural effusions    8/20 CXR: Bilateral mid to lower lung airspace disease favoring atelectasis,   versus pulmonary edema. Moderate to large bilateral pleural effusions      ED PN: documented bilateral pneumonia  8/11 Dr Driscilla Moats, Infectious disease pn: ?  Aspiration pneumonia  8/21 Dr Selena Batten podiatry pn: Hospital Problems: Aspiration pneumonia of   both lower lobes (HCC) 06/12/2022 Yes    8/22 Dr Renford Dills pn:  --Dysphagia:  Ongoing SLP evaluation  Aspiration on FEES study  NPO for now  Continue with NG tube feeding  --Septic shock D/t perforated viscus  --Acute hypoxemic respiratory failure:  Intubated 7/31, extubation 8/7  Reintubated 8/13, extubated on 8/19  Currently on 2 liters [place]      Treatment: serial CXR, Zosyn IV x1 on 7/31Cefepime IV (7/31), Rocephin, Merrem   IV 7/31 - 8/7, Flagyl IV 7/31 and 8/3-8/14, Vancomycin IV    Thank you,  Wayna Chalet, RN, CDI, CRCR  [email or web address]  Options provided:  -- Aspiration pneumonia confirmed present on  admission  -- Aspiration pneumonia confirmed not present on admission  -- Aspiration pneumonia ruled out  -- Other - I will add my own diagnosis  -- Disagree - Not applicable / Not valid  -- Disagree - Clinically unable to determine / Unknown  -- Refer to Clinical Documentation Reviewer    PROVIDER RESPONSE TEXT:    The diagnosis of Aspiration pneumonia was confirmed not present on admission.    Query created by: Wayna Chalet on 06/24/2022 8:57 AM      Electronically signed by:  Earnstine Regal MD 06/24/2022 4:01 PM

## 2022-06-25 LAB — BASIC METABOLIC PANEL
Anion Gap: 7 mmol/L (ref 5–15)
BUN: 65 MG/DL — ABNORMAL HIGH (ref 6–20)
Bun/Cre Ratio: 18 (ref 12–20)
CO2: 26 mmol/L (ref 21–32)
Calcium: 7.7 MG/DL — ABNORMAL LOW (ref 8.5–10.1)
Chloride: 101 mmol/L (ref 97–108)
Creatinine: 3.69 MG/DL — ABNORMAL HIGH (ref 0.70–1.30)
Est, Glom Filt Rate: 16 mL/min/{1.73_m2} — ABNORMAL LOW (ref >60)
Glucose: 188 mg/dL — ABNORMAL HIGH (ref 65–100)
Potassium: 4.2 mmol/L (ref 3.5–5.1)
Sodium: 134 mmol/L — ABNORMAL LOW (ref 136–145)

## 2022-06-25 LAB — CBC WITH AUTO DIFFERENTIAL
Absolute Immature Granulocyte: 0.2 10*3/uL — ABNORMAL HIGH (ref 0.00–0.04)
Basophils %: 1 % (ref 0–1)
Basophils Absolute: 0.1 10*3/uL (ref 0.0–0.1)
Eosinophils %: 0 % (ref 0–7)
Eosinophils Absolute: 0.1 10*3/uL (ref 0.0–0.4)
Hematocrit: 25.3 % — ABNORMAL LOW (ref 36.6–50.3)
Hemoglobin: 7.9 g/dL — ABNORMAL LOW (ref 12.1–17.0)
Immature Granulocytes: 1 % — ABNORMAL HIGH (ref 0.0–0.5)
Lymphocytes %: 12 % (ref 12–49)
Lymphocytes Absolute: 2 10*3/uL (ref 0.8–3.5)
MCH: 31.5 PG (ref 26.0–34.0)
MCHC: 31.2 g/dL (ref 30.0–36.5)
MCV: 100.8 FL — ABNORMAL HIGH (ref 80.0–99.0)
MPV: 11 FL (ref 8.9–12.9)
Monocytes %: 13 % (ref 5–13)
Monocytes Absolute: 2.3 10*3/uL — ABNORMAL HIGH (ref 0.0–1.0)
Neutrophils %: 73 % (ref 32–75)
Neutrophils Absolute: 12.4 10*3/uL — ABNORMAL HIGH (ref 1.8–8.0)
Nucleated RBCs: 0 PER 100 WBC
Platelets: 205 10*3/uL (ref 150–400)
RBC: 2.51 M/uL — ABNORMAL LOW (ref 4.10–5.70)
RDW: 19.8 % — ABNORMAL HIGH (ref 11.5–14.5)
WBC: 17.1 10*3/uL — ABNORMAL HIGH (ref 4.1–11.1)
nRBC: 0 10*3/uL (ref 0.00–0.01)

## 2022-06-25 LAB — POCT GLUCOSE
POC Glucose: 150 mg/dL — ABNORMAL HIGH (ref 65–117)
POC Glucose: 176 mg/dL — ABNORMAL HIGH (ref 65–117)
POC Glucose: 173 mg/dL — ABNORMAL HIGH (ref 65–117)
POC Glucose: 180 mg/dL — ABNORMAL HIGH (ref 65–117)
POC Glucose: 157 mg/dL — ABNORMAL HIGH (ref 65–117)

## 2022-06-25 MED ORDER — CEFTRIAXONE SODIUM 1 G IJ SOLR
1 | INTRAMUSCULAR | Status: DC
Start: 2022-06-25 — End: 2022-06-26
  Administered 2022-06-25: 16:00:00 1000 mg via INTRAVENOUS

## 2022-06-25 MED FILL — METRONIDAZOLE 500 MG/100ML IV SOLN: 500 MG/100ML | INTRAVENOUS | Qty: 100

## 2022-06-25 MED FILL — LANTUS 100 UNIT/ML SC SOLN: 100 UNIT/ML | SUBCUTANEOUS | Qty: 1

## 2022-06-25 MED FILL — HEPARIN SODIUM (PORCINE) 5000 UNIT/ML IJ SOLN: 5000 UNIT/ML | INTRAMUSCULAR | Qty: 1

## 2022-06-25 MED FILL — THERA M PLUS PO TABS: ORAL | Qty: 1

## 2022-06-25 MED FILL — FAMOTIDINE 20 MG PO TABS: 20 MG | ORAL | Qty: 1

## 2022-06-25 MED FILL — CEFTRIAXONE SODIUM 1 G IJ SOLR: 1 g | INTRAMUSCULAR | Qty: 1000

## 2022-06-25 MED FILL — AMIODARONE HCL 200 MG PO TABS: 200 MG | ORAL | Qty: 1

## 2022-06-25 MED FILL — RISAQUAD-2 PO CAPS: ORAL | Qty: 1

## 2022-06-25 NOTE — Progress Notes (Signed)
Infectious Disease Progress    Impression    Sepsis  S/p septic shock      Acute hypoxic respiratory failure  Re intubated 8/13, extubated 8/19  Initially intubated 7/31, s/p extubation 8/7  CXR  8/20 + + for atelectasis  Resp cultures  8/13+ for light yeast  Apparent C.albicans/ dubliensis    E. coli bacteremia  Blood cultures 7/31+ for E. coli   2/2 LAC (pan sensitive)   Negative repeat cultures 8/4       Acute abdomen  Pneumoperitoneum  S/p diagnostic laparoscopy, laparotomy  EGD 7/31  Findings of cloudy fluid in right upper quadrant, yellow/white  Inflammatory peel over lesser curve of stomach, inferior lobe of liver  No gross perforation identified  Pancreas and retroperitoneum appeared edematous  Intra-Op fluid culture + for E. Coli (pansensitive)    Hepatic abscess  Gas and fluid containing collection 7.1 x 10.8 x 5.4 cm  In anterior left lobe of liver, patchy areas of hypodensity right lobe  4 x 4.5 x 5.5 cm collection  S/p drainage by IR 8/4.Cultures + for  few E.coli  Repeat CT 8/7 shows reduction in size of hepatic abscess  Plan was for ceftriaxone IV x6 weeks end date 9/15, Flagyl  Until 8/18  Repeat CT 8/13 + for    S/p percutaneous drainage of left hepatic abscess with  Minimal fluid remaining around catheter, suggestion of peripancreatic edema, small bilateral pleural effusions  Repeat CT abdomen pelvis 8/16+ for increased peripancreatic edema suspicious for pancreatitis, small residual fluid collection in left hepatic lobe  Lipase 737      Leukocytosis improving  WBC 16.4  S/p abdominal wound dehiscence  Clean, no purulence per wound care  Cultures 8/17, 8/18-NG.    Gangrene, discoloration of toes  Persist      AKI  Cr 3.69 on HD    Coagulopathy  Improving    Thrombocytopenia  resolved      Transaminitis  Improving    Hyperbilirubinemia   Bilirubin 1.7    Diabetes type 2  Hyperglycemia  A1c 7.7    Diarrhea  FMS present    Obesity  BMI 34.76    Lines-drain -7/31, TLC-8/13, HD access-8/8,   E T-8/13,  FMS 8/14    Plan   Cefepime, Flagyl Fluconazole x7 days until 8/24   De escalate to Ceftriaxone IV  therafter  Pt will need  a total of  4-6 weeks of coverage of  liver abscess. (Currently D20 )   ID service to follow          Extensive review of chart notes, labs, imaging, cultures done  Additionally review of done:  D/w ICU team, Dr. Salena Saner    Patient seen today.  Awake, talking  D/w RN P and wound care  Accordion draining, Abdominal wound clean    History reviewed. No pertinent past medical history.    Past Surgical History:   Procedure Laterality Date    BLADDER SURGERY N/A 06/01/2022    ENDOSCOPY OF ILEAL CONDUIT performed by Guinevere Ferrari, MD at [facility]    CT VISCERAL PERCUTANEOUS DRAIN  06/05/2022    CT VISCERAL PERCUTANEOUS DRAIN 06/05/2022 [facility] RAD CT    LAPAROSCOPY N/A 06/01/2022    LAPAROSCOPY DIAGNOSTIC performed by Guinevere Ferrari, MD at [facility]    LAPAROTOMY N/A 06/01/2022    LAPAROTOMY EXPLORATORY performed by Guinevere Ferrari, MD at [facility]       Allergies   Allergen Reactions  Augmentin [Amoxicillin-Pot Clavulanate] Hives     Tolerated ceftriaxone 06/2022    Codeine      Unknown reaction       Social Connections: Not on file       No family status information on file.           Review of Systems - Negative except those mentioned in H&P      PHYSICAL EXAM:  General:          Awake, in no distress  EENT:              EOMI. Anicteric sclerae. MMM  Resp:               CTA bilaterally, no wheezing or rales.  No accessory muscle use  CV:                  Regular  rhythm,  No edema  GI:                   Soft, Non distended, Non tender.  +Bowel sounds  Neurologic:      Alert and oriented X 3, normal speech,   Psych:             Good insight. Not anxious nor agitated  Skin:                No rashes.  No jaundice.  Extremities :  No edema, discoloration of  toes++.     Jaime Amato, MD Jaime [name]

## 2022-06-25 NOTE — Consults (Signed)
Palliative Medicine  [phone]    Mr Hurlbut is continuing to make small gains towards discharge  No overt goals to be addressed at this time so we will sign off    Lennox Laity, APRN - NP

## 2022-06-25 NOTE — Plan of Care (Signed)
Problem: Physical Therapy - Adult  Goal: By Discharge: Performs mobility at highest level of function for planned discharge setting.  See evaluation for individualized goals.  Description: FUNCTIONAL STATUS PRIOR TO ADMISSION: Patient was independent and active without use of DME. Drove himself to [place] from [place] for NASCAR race. Denies home O2 use.     HOME SUPPORT PRIOR TO ADMISSION: The patient lived with wife however wife has her own medical issues and cannot provide physical assist.    Physical Therapy Goals  Reassessed 06/20/2022   1.  Patient will move from supine to sit and sit to supine, scoot up and down, and roll side to side in bed with maximal assist of 1 within 7 day(s).   2. Patient will sit EOB x 5 minutes with contact guard assist within 7 days.    3.  Patient will perform sit to stand with maximal assistance x2 within 7 day(s).  4.  Patient will transfer from bed to chair and chair to bed via minimal lift equipment (best mover) within 7 day(s).    Initiated 06/10/2022  1.  Patient will move from supine to sit and sit to supine, scoot up and down, and roll side to side in bed with contact guard assist within 7 day(s).   2. Patient will sit EOB x 10 minutes with supervision/set-up assist within 7 days.    2.  Patient will perform sit to stand with moderate assistance x2 within 7 day(s).  3.  Patient will transfer from bed to chair and chair to bed with moderate assistance x2 using the least restrictive device within 7 day(s).  4.  Patient will ambulate with moderate assistance x2 for 5 feet with the least restrictive device within 7 day(s).   Outcome: Progressing     PHYSICAL THERAPY TREATMENT    Patient: Jaime [name] (76 y.o. male)  Date: 06/25/2022  Diagnosis: Septicemia (HCC) [A41.9]  Gastric perforation (HCC) [K25.5]  Perforated abdominal viscus [R19.8] Gastric perforation (HCC)  Procedure(s) (LRB):  LAPAROTOMY EXPLORATORY (N/A)  LAPAROSCOPY DIAGNOSTIC (N/A)  ENDOSCOPY OF ILEAL  CONDUIT (N/A) 24 Days Post-Op  Precautions: Fall Risk                    ASSESSMENT:  Patient continues to benefit from skilled PT services and is slowly progressing towards goals. Received patient in bed, reports being tired from a busy morning but was agreeable to try. Sitting balance EOB improved today. He was able to hold himself up with no UE support, no leaning or sway noted. He required active assist for marching exercise while sitting EOB. Tolerated about 7 minutes of sitting EOB today. Reviewed supine leg exercises, patient able to perform 5 reps of ankle pumps and quad sets on his own before fatiguing. Continue to recommend intensive therapy upon discharge as patient remains significantly below his prior level of independence.         PLAN:  Patient continues to benefit from skilled intervention to address the above impairments.  Continue treatment per established plan of care.    Recommendation for discharge: (in order for the patient to meet his/her long term goals): Therapy 3 hours/day 5-7 days/week    Other factors to consider for discharge: patient's current support system is unable to meet their requirements for physical assistance and decreased activity tolerance    IF patient discharges home will need the following DME: hospital bed, mechanical lift, and wheelchair 22 inch       SUBJECTIVE:  Patient stated, "We are fixin' to try."    OBJECTIVE DATA SUMMARY:     Functional Mobility Training:  Bed Mobility:  Bed Mobility Training  Bed Mobility Training: Yes  Rolling: Maximum assistance;Assist X2  Supine to Sit: Maximum assistance;Assist X2  Sit to Supine: Maximum assistance;Assist X2  Scooting: Maximum assistance;Assist X2  Transfers:  Transfer Training  Transfer Training: No  Balance:  Balance  Sitting: Impaired  Sitting - Static: Good (unsupported)  Sitting - Dynamic: Fair (occasional)    Pain Rating:  0/10    Activity Tolerance:   Poor    After treatment:   Patient left in no apparent distress in  bed, Call bell within reach, Bed/ chair alarm activated, and Side rails x3      COMMUNICATION/EDUCATION:   The patient's plan of care was discussed with: registered nurse    Patient Education  Education Given To: Patient  Education Provided: Home Exercise Program  Education Provided Comments: reinforced importance of quad sets and ankle pumps in supine for strengthening  Education Method: Verbal  Barriers to Learning: None  Education Outcome: Verbalized understanding;Demonstrated understanding      Gaylyn Rong, PT  Minutes: 26

## 2022-06-25 NOTE — Wound Image (Signed)
Wound Care follow up and care for the Abdominal wound:  Chart reviewed and patient assessed. Pt. Requested that the wound care be done without any pain meds this time by the IV.  He did well today with the wound care.   Assessment: the Abdominal wound is clean and granulating well. We are continuing the Veriflo Wound Vac through the weekend.   The sacrum / Buttocks skin wound care was done today with the Santyl with NS moist to dry with foam dressings on each buttock wound.   Wound care ordered for this every 24 hours.    The toes are necrotic dry.     Negative Pressure Wound Therapy Abdomen (Active)   $ Standard NPWT >50 sq cm PER TX $ Yes 06/25/22 1137   Wound Type Surgical 06/25/22 1137   Unit Type ulta 06/25/22 1137   Dressing Type Other (Comment) 06/25/22 1137   Number of pieces used 3 06/25/22 1137   Number of pieces removed 2 06/25/22 1137   Cycle Continuous 06/25/22 1137   Target Pressure (mmHg) 125 06/25/22 1137   Irrigation Solution Vashe 06/25/22 1137   Instillation Volume  75 mL 06/25/22 1137   Soak Time  6 minutes 06/25/22 1137   Vac Frequency 2 hours 06/22/22 1000   Canister changed? No 06/25/22 1137   Dressing Status New dressing applied 06/25/22 1137   Dressing Changed Changed/New 06/25/22 1137   Drainage Amount Small 06/25/22 1137   Drainage Description Serosanguinous 06/25/22 1137   Dressing Change Due 06/29/22 06/25/22 1137   Output (ml) 75 ml 06/25/22 0700   Wound Assessment Granulation tissue 06/25/22 1137   Peri-wound Assessment Intact 06/25/22 1137   Odor None 06/25/22 1137   Number of days: 2       Wound 06/15/22 Sacrum Posterior DTI, stage 2 / excoriation secondary to diarrhea (Active)   Wound Image   06/25/22 1137   Wound Etiology Pressure Unstageable 06/25/22 1137   Dressing Status New dressing applied 06/25/22 1137   Wound Cleansed Cleansed with saline 06/25/22 1137   Dressing/Treatment Moist to dry;Foam;Pharmaceutical agent (see MAR) 06/25/22 1137   Offloading for Diabetic Foot Ulcers  Offloading ordered 06/25/22 1137   Dressing Change Due 06/26/22 06/25/22 1137   Wound Assessment Eschar dry;Purple/maroon;Pink/red 06/25/22 1137   Drainage Amount Small (< 25%) 06/25/22 1137   Drainage Description Serosanguinous 06/25/22 1137   Odor None 06/25/22 1137   Peri-wound Assessment Denuded 06/25/22 1137   Margins Undefined edges 06/25/22 1137   Wound Thickness Description not for Pressure Injury Full thickness 06/25/22 1137   Number of days: 9       Wound 06/19/22 Toe (Comment  which one) Right (Active)   Wound Image    06/19/22 0930   Wound Etiology Arterial 06/25/22 0839   Dressing Status Clean;Dry;Intact 06/24/22 2000   Wound Cleansed Betadine/povidone iodine 06/25/22 0839   Dressing/Treatment Open to air;Pharmaceutical agent (see MAR) 06/25/22 0839   Offloading for Diabetic Foot Ulcers Offloading ordered 06/25/22 0800   Dressing Change Due 06/21/22 06/21/22 2000   Wound Assessment Eschar dry;Purple/maroon 06/25/22 0800   Drainage Amount Scant (moist but unmeasurable) 06/25/22 0800   Drainage Description Serosanguinous 06/25/22 0800   Odor None 06/25/22 0800   Peri-wound Assessment Ecchymosis;Edematous 06/25/22 0800   Margins Defined edges 06/25/22 0800   Wound Thickness Description not for Pressure Injury Full thickness 06/23/22 2000   Number of days: 6       Wound 06/19/22 Abdomen Mid Surgical site dehisced on  06/18/22  Now Wound VAC (Active)   Wound Image   06/25/22 1137   Wound Etiology Surgical 06/25/22 1137   Dressing Status New dressing applied 06/25/22 1137   Wound Cleansed Vashe 06/25/22 1137   Dressing/Treatment Negative pressure wound therapy 06/25/22 1137   Offloading for Diabetic Foot Ulcers Offloading ordered 06/23/22 2000   Dressing Change Due 06/25/22 06/23/22 2000   Wound Length (cm) 22 cm 06/25/22 1137   Wound Width (cm) 3.2 cm 06/25/22 1137   Wound Depth (cm) 2.8 cm 06/22/22 1000   Wound Surface Area (cm^2) 70.4 cm^2 06/25/22 1137   Change in Wound Size % (l*w) 16.19 06/25/22 1137    Wound Volume (cm^3) 235.2 cm^3 06/22/22 1000   Wound Healing % 20 06/22/22 1000   Wound Assessment Granulation tissue;Slough 06/25/22 1137   Drainage Amount Small (< 25%) 06/25/22 1137   Drainage Description Serosanguinous 06/25/22 1137   Odor None 06/25/22 1137   Peri-wound Assessment Intact 06/25/22 1137   Margins Defined edges 06/25/22 1137   Wound Thickness Description not for Pressure Injury Full thickness 06/25/22 1137   Number of days: 6       Wound 06/19/22 Toe (Comment  which one) Left Black toes / necrotic. (Active)   Wound Image    06/19/22 1007   Wound Etiology Arterial 06/25/22 0839   Dressing Status Clean;Dry;Intact 06/25/22 0800   Wound Cleansed Betadine/povidone iodine 06/24/22 2000   Dressing/Treatment Open to air;Betadine swabs/povidone iodine 06/25/22 0839   Dressing Change Due 06/22/22 06/19/22 1007   Wound Assessment Eschar dry 06/24/22 2000   Drainage Amount None (dry) 06/23/22 2000   Odor None 06/23/22 0800   Peri-wound Assessment Edematous;Fragile;Hyperpigmented;Ecchymosis 06/23/22 2000   Wound Thickness Description not for Pressure Injury Full thickness 06/23/22 2000   Number of days: 6      Treatment: the Veriflo dressing was applied after cleansing the abdominal wound with Vashe solution and wiping with gauze.   Pt. Was instilled and then dwelled and then the regular wound vac in place. The next instillation cycle will be at around 2 pm. Nursing is aware of what to watch out for today and when to change the instillation / Vashe bottles. He has three extra bottles in the room. He should be able to get 5-6 instillations from each bottle.     Plan:  Continue the wound care on the sacrum / buttocks as ordered with frequent turns from side to side and float the heels. This is done by nursing at about the same time every day. Please do it as it is ordered. This wound needs debridement.   Pt. Is at high risk for pressure injuries.   Wound Vac dressing change on Monday by the Wound care nurse.    Ronnald Collum, RN, BSN, [employer]

## 2022-06-25 NOTE — Care Coordination-Inpatient (Signed)
Transition of Care Plan:     RUR: 19% (moderate RUR)  Prior Level of Functioning:  Independent - lives with wife in [place].  Disposition: [facility] ([place], [place]) vs placement  If SNF or IPR: Date FOC offered: TBD  Date FOC received: TBD  Accepting facility: TBD  Date authorization started with reference number: TBD  Date authorization received and expires: TBD  Follow up appointments: PCP/Specialist  DME needed: TBD  Transportation at discharge: [facility] anticipated  IM/IMM Medicare/Tricare letter given: 2nd IM needed prior to discharge.  Is patient a Veteran and connected with VA? yes              If yes, was [employer] transfer form completed and VA notified? No.  Caregiver Contact: Dyrell Tuccillo - Wife - [phone]  Discharge Caregiver contacted prior to discharge? Patient to contact.  Care Conference needed? No.  Barriers to discharge: nephrology/new HD?, podiatry clearance, surgical clearance, lines/drains, placement choices        Ronnell Freshwater, BSN, RN    Care Management  [phone]

## 2022-06-25 NOTE — Other (Signed)
[facility]  [facility]  DIABETES MANAGEMENT CONSULT    Consulted by Provider for advanced nursing evaluation and care for inpatient blood glucose management.    Evaluation and Action Plan   This 76 year old Caucasian male was admitted with RUQ pain and underwent laparotomy 06/01/22. Was in septic shock with multi-organ failure. Several episodes of Vtach. Remained on vent support, pressors & CRRT. A new finding of liver abscess noted on CT; drain placed.    There is concern for LLE viability due to diminished pedal pulses. Ischemia of 3 toes on right & 4 toes on the left.  Per vascular, he was not a candidate for endovascular or surgical revascularization at this time. Moved out of ICU but experienced an acute decline (hypotension, arrhythmia & AMS) 06/14/22 & was returned to ICU. Had to be re-intubated but is now off sedation & pressor support. Infectious disease continues to follow & remains on multiple antibiotics. Feeding by Dobhoff. Podiatry involved & awaiting demarcation of foot wounds before proceeding. Renal issues addressed per nephrology. Plans to move back to [facility] area are being coordinated by SW & wife; will need dialysis, wound care & nutrition.    As for BG management in this patient with known Type 2 diabetes, treated with oral agent. Has required insulin to offset steroids during this hospital stay and now needs insulin to override TF. Bgs have been in target with 16 units of Lantus insulin since 06/19/22. Amount of CHO per day was advanced 06/23/22 so insulin dose increased 06/24/22. Bgs in target!    Management Rationale Action Plan   Medication   TF Running at goal rate of 30cc/hr (173 CHO/D)   Continue Lantus insulin 18 units D    LOW corrective approach     Additional orders               Initial Presentation   Jaime [name] is a 76 y.o. male admitted 05/31/22 from ER after experiencing RUQ pain associated with generalized weakness, nausea & vomiting for several days PTA.  AMS+. Hypotension+. Fever+. Dyspneic+. He is visiting from [place].  Ectopic atrial tachycardia  ER exam:  Cardiovascular:      Rate and Rhythm: Regular rhythm. Tachycardia present.   Pulmonary:      Comments: He is tachypneic, coarse breath sounds on inspiration at bilateral bases  Abdominal:      Palpations: Abdomen is soft.      Comments: Tender to palpation diffusely in the right sided abdomen without guarding or rebound tenderness   LAB: WBC 16.6. Normal H&H. Low platelets.BG 272/AG 17. AKI. Elevated liver enzymes. NT pro-BNP 20,980. Lipase >3000.     CXR:   Right IJ catheter satisfactory position without pneumothorax. ET   tube and NG tube as above   CT Head: Negative  CT ABd:   Extraluminal air bubbles in the right upper quadrant concerning for gastric   perforation. Inflammatory changes are noted about the tail of the pancreas   extending into the left anterior pararenal space, please correlate with any   evidence of acute pancreatitis. Bilateral lower lobe infiltrates.     HX: DM    INITIAL DX: Septicemia (HCC) [A41.9]  Gastric perforation (HCC) [K25.5]  Perforated abdominal viscus [R19.8]     Current Treatment     TX: 06/01/22 LAPAROTOMY EXPLORATORY  LAPAROSCOPY DIAGNOSTIC ENDOSCOPY OF ILEAL CONDUIT  EGD    Hospital Course   Clinical progress has been complicated by multi-organ failure.  06/01/22 Dialysis  06/02/22 In septic shock with multi-organ failure. On vent support, sedation, pressor support and bicarb infusion. On Abx. Two runs of VT this morning requiring defibrillation; cardiology conversation anticipated. CRRT+  06/03/22 Remains on vent support C vent Fi02 50%/Peep 7. Low grade fever; on Abx. Steroids+. Afib/bradycardic. Pressors+. CRRT+.   06/04/22 Remains on vent support and pressors. On Abx. Concern for LLE. CRRT. CT: Enlarged left hepatic abscess contain gas with other areas of probable abscess/phlegmonous infection.  06/05/22 On AC vent support. On Amio, vaso, levo & Fentanyl infusions. OG  draining green fluid. CRRT+.  Vascular: Patient remains at risk for progressive deterioration and limb loss but needs to be weaned from pressor support before intervention is safe.  06/08/22 On Spon vent support; plans to extubate after CT today. Fentanyl pushes. H&H low. CRRT+. Mottled & blue toes bilaterally. Vascular AKI study to be completed  06/09/22 Alert. 02NC. Amio infusion+. Pedal pulses returning (no need to use doppler)  06/10/22 Alert. 02NC. NSR. Off pressors. TF running. Swallow eval today => may begin CL. Undergoing hemodialysis now. Continued cyanosis of bilateral toes.  06/11/22 Alert & conversational today  06/12/22 A&O. Failed swallow; allowed ice chips. Podiatry to see for LE ischemia  06/15/22 Intubated & sedated on pressor support. TF restarted at trickle rate. Dialysis planned per nephrology.  06/16/22 Eyes open. Intubated & sedated on Precedex. ST. Hypotensive on epi infusion. Amio+. TF running @10cc /hr. CVVH+.   06/17/22 Eyes closed. Intubated & sedated on Precedex. Spon vent Fi02 50/Peep 5; trying to wean. NSR. Amio +. TF @goal  rate. CVVH+. Wound care involved  06/18/22 Eyes open. Following conversation. Raising left arm. Intubated on spon vent support & mildly sedated; will try again to wean from vent. TF running at goal rate. May get trached. Blood transfusion+  06/19/22 On sp vent support. Concerning trach. Off pressor. Dobhoff with TF. Anuric. HD today  06/22/22. A&O. Off vent & pressor support. TF infusing without issue via Dobhoff. Speech swallow evaluation today. 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 Case management continues to pursue [facility] placement in [place] where he lives  06/23/22 A&O. Dobhoff for nutrition; CHO advancing today. Status of toe ischemia unchanged  06/24/22 Per nursing, "Patient is very agitated and angery. Patient refused to get any care at this point and wanted to be left alone. CCL tried 06/25/22  guided IV on patient. Patient is very upset since PICC team already had an unsuccessful attempt. Rn Convinced the patient that he will be left along for an hour or so to give him a break and will check upon patient later again. Patient agreed."  06/25/22 A&O. Nutrition via Dobhoff. Working with PT/OT. Multiple services participating in care    Diabetes History   Type 2 diabetes on metformin therapy  Checks Bgs and they are in the 100s  Is physically active    Diabetes-related Medical History deferred    Diabetes Medication History - based on PTA list  Drug class Currently in use   Biguanide [x]  Metformin (Glucophage)  []  Metformin ER (Glucophage XL)     Diabetes self-management practices: deferred  Overall evaluation:    [x]  Unknown A1c     Subjective   NA     Objective   Physical exam  General Obese male who is in no acute distress  Neuro  Alert & oriented  Vital Signs   Vitals:    06/25/22 0830  BP: 116/61   Pulse: 94   Resp: 24   Temp: 99.2 F (37.3 C)   SpO2:      Extremities    Diabetic foot exam:    Left Foot     Visual Exam: Cool & dry. Thickened toenails. 3 toes gangrenous    Pulse DP: +1   Right Foot   Visual Exam: Cool & dry. Thickened toenails . 4 toes gangrenous   Pulse DP: +1      Laboratory  Recent Labs     06/24/22  0315   WBC 16.4*   HGB 8.2*   HCT 26.0*   MCV 99.2*   PLT 197       Recent Labs     06/23/22  0112 06/24/22  0315 06/25/22  0314   NA 134* 133* 134*   K 3.5 3.9 4.2   CL 101 100 101   CO2 26 24 26    PHOS 3.3 3.7  --    BUN 60* 86* 65*   CREATININE 3.35* 4.61* 3.69*       Lab Results   Component Value Date    ALT 25 06/24/2022    AST 41 (H) 06/24/2022    ALKPHOS 218 (H) 06/24/2022    BILITOT 1.6 (H) 06/24/2022     No results found for: TSH, TSHREFLEX, TSHFT4, TSHELE, TSH3GEN, TSHHS   Lab Results   Component Value Date    LABA1C 7.7 (H) 06/02/2022     Factors impacting BG management  Factor Dose Comments   Nutrition:  TF   @target  rate of 30cc/hr  (173 CHO/D)    Drug  EPO     PRBC 06/18/22    A1cs inaccurate   Pain Fent PRN    Infection Cefepime Q24 hrs  Flagyl Q12 hrs  Diflucan Q48 hrs WBC elevated   Other:   Kidney function  Liver function   AKI  Liver enzymes elevated   HD today     Blood glucose pattern      Significant diabetes-related events  05/31/22 Admission BG 233  06/01/22 Dex 4mg  => BG 100s => HC started  06/02/22 Bgs into 200s  06/03/22 Bgs in 170-180s  06/04/22 Bgs rising abit. HC reduced frequency today. Trickle feeds to begin  06/05/22 Bgs rising into 200s  06/08/22 Bgs in target  06/09/22 Bgs rising with start of TF at higher rate  06/10/22 Bgs rose abit with TF @30cc /hr  06/11/22 Bgs 108, 113. No insulin given  06/12/22 No insulin given. No nutrition. AKI  06/14/22 Acute decline in health status after dialysis => back to ICU  06/15/22 TF restarted  06/16/22 TF still at 10cc/hr  06/17/22 TF at goal rate. Bgs 190s  06/18/22 Tfs running at goal rate. FBG 183  06/19/22 FBG 170 but higher during the day  06/22/22 Bgs 130-170s  06/23/22 Bgs in acceptable range but CHO load increasing  06/24/22 Bgs 150-180s  06/25/22 Bgs in target    Assessment and Nursing Intervention   Nursing Diagnosis Risk for unstable blood glucose pattern   Nursing Intervention Domain 5250 Decision-making Support   Nursing Interventions Examined current inpatient diabetes/blood glucose control   Explored factors facilitating and impeding inpatient management  Explored corrective strategies with patient and responsible inpatient provider   Informed patient of rational for insulin strategy while hospitalized     Billing Code(s)   []  99233 IP subsequent hospital care - 50 minutes   []  99232 IP subsequent hospital care - 35 minutes   [  x] 99231 IP subsequent hospital care - 25 minutes   []  99221 IP initial hospital care - 40 minutes     Before making these care recommendations, I personally reviewed the hospitalization record, including notes, laboratory & diagnostic data and current medications, and examined the patient at the bedside (circumstances  permitting) before determining care. More than fifty (50) percent of the time was spent in patient counseling and/or care coordination.  Total minutes: 25    , APRN - CNS  Clinical Nurse Specialist - Diabetes & endocrine disorders  [facility]  Access via [employer]

## 2022-06-25 NOTE — Plan of Care (Signed)
Speech LAnguage Pathology TREATMENT    Patient: Jaime [name] 76 y.o. male)  Date: 06/25/2022  Primary Diagnosis: Septicemia ([place]) [A41.9]  Gastric perforation ([place]) [K25.5]  Perforated abdominal viscus [R19.8]  Procedure(s) (LRB):  LAPAROTOMY EXPLORATORY (N/A)  LAPAROSCOPY DIAGNOSTIC (N/A)  ENDOSCOPY OF ILEAL CONDUIT (N/A) 24 Days Post-Op   Precautions:  Fall Risk                  ASSESSMENT :  Based on the objective data described below, the patient presents with strong vocal quality, however patient with intermittent coughing that persists with ice chip trials. May benefit from repeat FEES within the next few days for updated view of pharyngeal swallow function.    Patient will benefit from skilled intervention to address the above impairments.     PLAN :  Recommendations and Planned Interventions:  Diet: NPO and ice chips  SLP to follow for dysphagia management     Acute SLP Services: Yes, SLP will continue to follow per plan of care.    Discharge Recommendations: Continue to assess pending progress     SUBJECTIVE:   Patient stated, "I need to get home."    OBJECTIVE:   History reviewed. No pertinent past medical history.  Past Surgical History:   Procedure Laterality Date    BLADDER SURGERY N/A 06/01/2022    ENDOSCOPY OF ILEAL CONDUIT performed by Melene Plan, MD at [facility] OR    CT VISCERAL PERCUTANEOUS DRAIN  06/05/2022    CT VISCERAL PERCUTANEOUS DRAIN 06/05/2022 [facility] RAD CT    LAPAROSCOPY N/A 06/01/2022    LAPAROSCOPY DIAGNOSTIC performed by Melene Plan, MD at [facility]    LAPAROTOMY N/A 06/01/2022    LAPAROTOMY EXPLORATORY performed by Melene Plan, MD at [facility]     Prior Level of Function/Home Situation:   Social/Functional History  Lives With: Spouse  Type of Home: House  Home Layout: One level  Home Equipment: None  ADL Assistance: Independent  Ambulation Assistance: Independent  Transfer Assistance: Independent  Active Driver: Yes    Baseline Assessment:                Cognitive and Communication  Status:  Neurologic State: Alert  Orientation Level: Oriented x4  Cognition: Follows commands    Dysphagia:  Oral Assessment:     P.O. Trials:  PO Trials  Assessment Method(s): Observation  Patient Position: up in bed  Vocal Quality: No Impairment  Consistency Presented: Ice Chips  How Presented: SLP-fed/Presented;Spoon  Bolus Acceptance: No impairment  Bolus Formation/Control: No impairment  Propulsion: No impairment  Oral Residue: None  Aspiration Signs/Symptoms: Strong cough            Motor Speech:         Language Comprehension and Expression:                   Neuro-Linguistics:                      Voice:       Respiratory Status/Airway:  Room air                                 After treatment:   Patient left in no apparent distress in bed, Call bell left within reach, and Nursing notified    COMMUNICATION/EDUCATION:   Patient was educated regarding his deficit(s) of dysphagia as this relates to his  diagnosis.  He demonstrated Good understanding as evidenced by Verbalizing understanding.    The patient's plan of care including recommendations, planned interventions, and recommended diet changes were discussed with: Registered nurse    Patient/family have participated as able in goal setting and plan of care and Patient/family agree to work toward stated goals and plan of care    Thank you,  Zoila Shutter, SLP  Minutes: 14    Problem: SLP Adult - Impaired Swallowing  Goal: By Discharge: Advance to least restrictive diet without signs or symptoms of aspiration for planned discharge setting.  See evaluation for individualized goals.  Description: Speech Pathology Goals  1. Patient will participate in FEES to objectively assess swallow function prior to PO diet initiation. Goal initiated 06/22/22. Goal met 06/22/22.   2. Patient will tolerate therapeutic PO trials of thin liquids (e.g., ice chips, small sips of water) and purees without clinical s/s of aspiration or respiratory decline. Goal initiated 06/22/22.  3.  Patient will complete pharyngeal strengthening exercises (e.g., effortful swallow) with min cues for technique. Goal initiated 06/22/22.   4. Patient will complete repeat instrumental swallow study to determine readiness for PO diet when clinically appropriate. Goal initiated 06/22/22.   Outcome: Progressing

## 2022-06-25 NOTE — Progress Notes (Signed)
Hospitalist Progress Note    NAME:   Jaime [name]   DOB: November 05, 1945   MRN: [id number]     Date/Time: 06/25/2022 2:10 PM  Patient PCP: No primary care provider on file.    Estimated discharge date:8/27  Barriers: Feeding improvement vs need of PEG tube, Nephrology/ ID clearance.Case manager working on transfer to [employer]?      For reference :   76 y/m with prolonged hospitalization, was admitted to surgical service on 7/31 for perforated hollow viscus underwent emergent laparotomy/ septic shock d/t peritonitis, was intubated and extubated x 2. Also had AKI needing CVVH, now on HD. Also has bilateral foot gangrene. Was transferred to hospitalist service on 8/21.      Assessment / Plan:  #Septic shock- resolved   D/t perforated viscus  S/p Laparotomy 7/31, with wound vac drainage   Liver abscess s/p IR drainage  -Now off pressors  -General surgery and ID following  -Blood culture 7/31 and 8/4  -Culture grew E coli  -Respiratory culture - 8/13- candida ,Wound culture on 8/17- ng , anerobic culture  - 8/18 - ng   -S/P Cefepime, flagyl, Fluconazole and on ceftriaxone from 8/24 - to be continued for a total of 4 - 6 weeks of antibiotics   -Total count  still high , no fever , no left shift   -Wound care following  -Liver abscess -S/p IR drainage on 8/4, culture positive for E coli, Drain exchange on 8/22   -Has fecal management system, remove once collection decreases     #Acute hypoxemic respiratory failure:  Intubated 7/31, extubation 8/7  -Reintubated 8/13, extubated on 8/19  -Currently on 2 liters NC     #Dysphagia:  -Ongoing SLP evaluation- advised npo   -Aspiration on FEES study  -Continue with NG tube feeding     #Acute kidney injury:  -Now on HD  -Continue with HD  -Nephrology following     #Bilateral lower extremity critical limb ischemia  Gangrene both feet  -Podiatry and vascular surgery following  -Waiting for demarcation - will follow up once demarcation      #Diabetes mellitus:  -Sliding scale and  lantus  -Monitor FS  -Diabetes team following     #Sacral wound:  -Wound care following     #Goals of care : palliative involved   -Spouse is working with local [place] [facility] and [facility] [place] on goal of transferring patient to [place] area for next steps in his care, including resources for dialysis and transportation.     Medical Decision Making:   I personally reviewed labs: CBC, BMP  I personally reviewed imaging: na   I personally reviewed EKG: None  Toxic drug monitoring: Monitor creatinine with abx  Discussed case with: RN, patient     Code Status: Full  DVT Prophylaxis: heparin  GI Prophylaxis:none      Subjective:     Chief Complaint / Reason for Physician Visit  Patient admitted for hollow viscus perforation and being managed with wound care . He also had aki - needing hd .      Labs and charts reviewed and patient examined .          Objective:     VITALS:   Last 24hrs VS reviewed since prior progress note. Most recent are:  Patient Vitals for the past 24 hrs:   BP Temp Temp src Pulse Resp SpO2   06/25/22 1145 (!) 142/66 99.1 F (37.3 C) Oral (!) 104 (!) 33  93 %   06/25/22 1115 (!) 143/58 -- -- 92 21 92 %   06/25/22 0830 116/61 99.2 F (37.3 C) Oral 94 24 --   06/25/22 0800 126/85 99.4 F (37.4 C) Oral 94 22 91 %   06/25/22 0700 (!) 149/62 -- -- 93 21 93 %   06/25/22 0600 131/62 -- -- 93 27 96 %   06/25/22 0500 (!) 138/113 -- -- 97 26 95 %   06/25/22 0400 (!) 143/58 98 F (36.7 C) Tympanic 95 21 94 %   06/25/22 0300 135/63 -- -- (!) 102 (!) 32 93 %   06/25/22 0200 133/61 -- -- (!) 102 26 93 %   06/25/22 0100 126/69 -- -- 100 28 95 %   06/25/22 0006 -- -- -- 97 25 (!) 88 %   06/25/22 0000 121/62 98.6 F (37 C) Oral 97 27 91 %   06/24/22 2300 (!) 134/59 -- -- 96 23 92 %   06/24/22 2200 125/67 -- -- 95 25 92 %   06/24/22 2100 132/74 -- -- 99 25 92 %   06/24/22 2000 (!) 144/54 98.5 F (36.9 C) Oral 93 24 92 %   06/24/22 1815 (!) 148/67 98 F (36.7 C) -- 100 23 95 %   06/24/22 1800 135/68 -- -- (!) 102 -- --    06/24/22 1745 136/65 -- -- (!) 101 -- --   06/24/22 1730 120/62 -- -- 99 -- --   06/24/22 1715 119/68 -- -- 100 -- --   06/24/22 1700 133/68 -- -- 100 -- --   06/24/22 1645 128/63 -- -- 98 -- --   06/24/22 1630 129/75 -- -- 99 -- --   06/24/22 1615 (!) 141/65 -- -- 97 -- --   06/24/22 1600 130/83 98 F (36.7 C) Oral 97 22 92 %   06/24/22 1545 122/63 -- -- 99 -- --   06/24/22 1530 (!) 122/57 -- -- 96 -- --   06/24/22 1515 (!) 149/60 -- -- 95 -- --   06/24/22 1500 (!) 147/65 -- -- 94 20 94 %   06/24/22 1430 138/67 98 F (36.7 C) -- 95 17 93 %         Intake/Output Summary (Last 24 hours) at 06/25/2022 1410  Last data filed at 06/25/2022 0800  Gross per 24 hour   Intake 1630 ml   Output 2945 ml   Net -1315 ml        I had a face to face encounter and independently examined this patient on 06/25/2022, as outlined below:  PHYSICAL EXAM:  General:          Alert, cooperative  EENT:              EOMI. Anicteric sclerae.Bilateral jugular catheters ( rt for hd , left cvc )   Resp:               CTA bilaterally, no wheezing or rales.  No accessory muscle use  CV:                  Regular  rhythm,  No edema  GI:                   Soft, Non distended,Mildly tender.  +Bowel sounds, wound vac in place , drain on rt upper quadrant , fecal drainage system   Neurologic:       Alert and oriented X 3, normal speech,   Psych:   Good  insight. Not anxious nor agitated  Skin:                No rashes.  No jaundice  Foot :  bilateral feet - gangrene of first second and third toes , not yet full demarcation        Reviewed most current lab test results and cultures  YES  Reviewed most current radiology test results   YES  Review and summation of old records today    NO  Reviewed patient's current orders and MAR    YES  PMH/SH reviewed - no change compared to H&P  ________________________________________________________________________  Care Plan discussed with:    Comments   Patient x    Family      RN x    Care Manager     Consultant                         Multidiciplinary team rounds were held today with case manager, nursing, pharmacist and Higher education careers adviser.  Patient's plan of care was discussed; medications were reviewed and discharge planning was addressed.     ________________________________________________________________________  Total NON critical care TIME:  35  Minutes    Total CRITICAL CARE TIME Spent:   Minutes non procedure based      Comments   >50% of visit spent in counseling and coordination of care     ________________________________________________________________________  Earnstine Regal, MD     Procedures: see electronic medical records for all procedures/Xrays and details which were not copied into this note but were reviewed prior to creation of Plan.      LABS:  I reviewed today's most current labs and imaging studies.  Pertinent labs include:  Recent Labs     06/24/22  0315 06/25/22  1214   WBC 16.4* 17.1*   HGB 8.2* 7.9*   HCT 26.0* 25.3*   PLT 197 205     Recent Labs     06/23/22  0112 06/24/22  0315 06/25/22  0314   NA 134* 133* 134*   K 3.5 3.9 4.2   CL 101 100 101   CO2 26 24 26    GLUCOSE 184* 176* 188*   BUN 60* 86* 65*   CREATININE 3.35* 4.61* 3.69*   CALCIUM 8.0* 8.3* 7.7*   MG 2.3 2.5*  --    PHOS 3.3 3.7  --    LABALBU  --  1.6*  --    BILITOT  --  1.6*  --    AST  --  41*  --    ALT  --  25  --        Signed: , MD

## 2022-06-25 NOTE — Progress Notes (Signed)
0700- Bedside and Verbal shift change report given to Leighton Brickley RN (oncoming nurse) by Idell Pickles RN (offgoing nurse). Report included the following information Nurse Handoff Report, Index, Intake/Output, MAR, and Recent Results.     0800- Shift assessment complete.     55- TRANSFER - OUT REPORT:    Verbal report given to [name] on Jaime [name]  being transferred to [facility] for routine progression of patient care       Report consisted of patient's Situation, Background, Assessment and   Recommendations(SBAR).     Information from the following report(s) Index, Intake/Output, MAR, and Recent Results was reviewed with the receiving nurse.           Lines:   CVC Triple Lumen 06/14/22 Internal jugular (Active)   Central Line Being Utilized Yes 06/25/22 0400   Criteria for Appropriate Use Limited/no vessel suitable for conventional peripheral access 06/25/22 0400   Site Assessment Clean, dry & intact 06/25/22 0400   Phlebitis Assessment No symptoms 06/25/22 0400   Infiltration Assessment 0 06/25/22 0400   Color/Movement/Sensation Capillary refill less than 3 sec 06/25/22 0400   Proximal Lumen Color/Status White;Flushed;Capped 06/25/22 0400   Medial Lumen Status Blue;Flushed;Capped 06/25/22 0400   Distal Lumen Color/Status Brown;Flushed;Capped 06/25/22 0400   Line Care Cap changed;Ports disinfected 06/25/22 0400   Alcohol Cap Used Yes 06/25/22 0400   Date of Last Dressing Change 06/23/22 06/25/22 0400   Dressing Type Transparent w/CHG gel 06/25/22 0400   Dressing Status Clean, dry & intact 06/25/22 0400   Dressing Intervention New 06/19/22 0544       Hemodialysis Central Access Right Neck (Active)   Continued need for line? Yes 06/25/22 0400   Site Assessment Clean, dry & intact 06/25/22 0400   CVC Lumen Status Capped 06/24/22 1200   Venous Lumen Status Brisk blood return;Flushed;Infusing 06/25/22 0400   Arterial Lumen Status Brisk blood return;Flushed;Infusing 06/25/22 0400   Alcohol Cap Used No 06/25/22 0400   Line Care  Connections checked and tightened;Chlorhexidine wipes;Ports disinfected 06/25/22 0400   Dressing Type Transparent 06/25/22 0400   Date of Last Dressing Change 06/23/22 06/25/22 0400   Dressing Status Clean, dry & intact 06/25/22 0400   Dressing Intervention Dressing changed;New 06/23/22 0600   Dressing Change Due 06/26/22 06/25/22 0400   Verification by x-ray Yes 06/15/22 0700        Opportunity for questions and clarification was provided.      Patient transported with:  Monitor and Registered Nurse

## 2022-06-25 NOTE — Progress Notes (Signed)
NAME: Jaime [name]        DOB:  07/27/1946        MRN:  [id number]                     Assessment   :                                               Plan:  AKI  Pancreatitis  Hyponatremia  DM  anemia No acute need for HD today.  Plan HD in AM.    Watch for renal recovery.    H/H not at goal.  Continue ESA.    DW RN            Subjective:     Chief Complaint:   No complaints.    Review of Systems:    Symptom Y/N Comments  Symptom Y/N Comments   Fever/Chills    Chest Pain     Poor Appetite    Edema     Cough    Abdominal Pain     Sputum    Joint Pain     SOB/DOE    Pruritis/Rash     Nausea/vomit    Tolerating PT/OT     Diarrhea    Tolerating Diet     Constipation    Other       Could not obtain due to:      Objective:     VITALS:   Last 24hrs VS reviewed since prior progress note. Most recent are:  Vitals:    06/25/22 0830   BP: 116/61   Pulse: 94   Resp: 24   Temp: 99.2 F (37.3 C)   SpO2:        Intake/Output Summary (Last 24 hours) at 06/25/2022 1004  Last data filed at 06/25/2022 0800  Gross per 24 hour   Intake 1725 ml   Output 3085 ml   Net -1360 ml        Telemetry Reviewed:     PHYSICAL EXAM:  General: NAD  1+ edema      Lab Data Reviewed: (see below)    Medications Reviewed: (see below)    PMH/SH reviewed - no change compared to H&P  ________________________________________________________________________  Care Plan discussed with:  Patient     Family      RN     Care Manager                    Consultant:          Comments   >50% of visit spent in counseling and coordination of care       ________________________________________________________________________  Marina Gravel, MD     Procedures: see electronic medical records for all procedures/Xrays and details which  were not copied into this note but were reviewed prior to creation of Plan.      LABS:  Recent Labs     06/24/22  0315   WBC 16.4*   HGB 8.2*   HCT 26.0*   PLT 197        Recent Labs     06/23/22  0112 06/24/22  0315 06/25/22  0314   NA 134* 133* 134*   K 3.5 3.9 4.2   CL 101 100 101   CO2 26 24 26    BUN 60* 86* 65*  MG 2.3 2.5*  --    PHOS 3.3 3.7  --        Recent Labs     06/24/22  0315   GLOB 4.6*       No results for input(s): INR, APTT in the last 72 hours.    Invalid input(s): PTP   No results for input(s): TIBC, FERR in the last 72 hours.    Invalid input(s): FE, PSAT   No results found for: FOL, RBCF   No results for input(s): PH, PCO2, PO2 in the last 72 hours.  No results for input(s): CPK, CKMB, TROPONINI in the last 72 hours.  No components found for: [facility]  @labua @    MEDICATIONS:  Current Facility-Administered Medications   Medication Dose Route Frequency    insulin glargine (LANTUS) injection vial 18 Units  18 Units SubCUTAneous Nightly    lidocaine 2 % injection 20 mL  20 mL IntraDERmal Once    collagenase ointment   Topical Daily    epoetin alfa-epbx (RETACRIT) injection 6,000 Units  6,000 Units SubCUTAneous Once per day on Mon Wed Fri    famotidine (PEPCID) tablet 20 mg  20 mg Per NG tube Daily    amiodarone (CORDARONE) tablet 200 mg  200 mg Per NG tube Daily    therapeutic multivitamin-minerals 1 tablet  1 tablet Per NG tube Daily    fentaNYL (SUBLIMAZE) injection 25 mcg  25 mcg IntraVENous Q1H PRN    heparin (porcine) injection 5,000 Units  5,000 Units SubCUTAneous BID    fluconazole (DIFLUCAN) in 0.9 % sodium chloride IVPB 200 mg  200 mg IntraVENous Q48H    oxyCODONE (ROXICODONE) immediate release tablet 5 mg  5 mg Oral Q4H PRN    midodrine (PROAMATINE) tablet 5 mg  5 mg Oral Daily PRN    insulin lispro (HUMALOG) injection vial 0-4 Units  0-4 Units SubCUTAneous 4 times per day    cefepime (MAXIPIME) 1,000 mg in sodium chloride 0.9 % 50 mL IVPB (mini-bag)  1,000 mg IntraVENous Q24H    metronidazole (FLAGYL) 500 mg in 0.9% NaCl 100 mL IVPB premix  500 mg IntraVENous Q12H    acidophilus probiotic capsule 1 capsule  1 capsule Oral Daily    alcohol 62% (NOZIN)  nasal sanitizer 1 ampule  1 ampule Topical BID    bisacodyl (DULCOLAX) suppository 10 mg  10 mg Rectal Daily PRN    albumin human 25% IV solution 25 g  25 g IntraVENous PRN    heparin (porcine) injection 1,200 Units  1,200 Units IntraCATHeter PRN    And    heparin (porcine) injection 1,000 Units  1,000 Units IntraCATHeter PRN    sodium chloride 0.9 % bolus 100 mL  100 mL IntraVENous PRN    balsum peru-castor oil (VENELEX) ointment   Topical BID    glucose chewable tablet 16 g  4 tablet Oral PRN    dextrose bolus 10% 125 mL  125 mL IntraVENous PRN    Or    dextrose bolus 10% 250 mL  250 mL IntraVENous PRN    glucagon (rDNA) injection 1 mg  1 mg SubCUTAneous PRN    dextrose 10 % infusion   IntraVENous Continuous PRN    sodium chloride flush 0.9 % injection 5-40 mL  5-40 mL IntraVENous 2 times per day    sodium chloride flush 0.9 % injection 5-40 mL  5-40 mL IntraVENous PRN    0.9 % sodium chloride infusion   IntraVENous PRN  acetaminophen (TYLENOL) tablet 650 mg  650 mg Oral Q6H PRN    Or    acetaminophen (TYLENOL) suppository 650 mg  650 mg Rectal Q6H PRN    ondansetron (ZOFRAN) injection 4 mg  4 mg IntraVENous Q6H PRN

## 2022-06-25 NOTE — Plan of Care (Addendum)
Problem: Occupational Therapy - Adult  Goal: By Discharge: Performs self-care activities at highest level of function for planned discharge setting.  See evaluation for individualized goals.  Description: FUNCTIONAL STATUS PRIOR TO ADMISSION:    , ADL Assistance: Independent,  ,  ,  ,  ,  ,  , Ambulation Assistance: Independent, Transfer Assistance: Independent, Active Driver: Yes     HOME SUPPORT: Patient lived with spouse and was independent.    Occupational Therapy Goals:  Initiated 06/10/2022  Re-evaluation 06/20/2022 - continue all goals  1.  Patient will perform grooming at bed level with Moderate Assist within 7 day(s).  2.  Patient will perform self-feeding with Moderate Assist within 7 day(s).  3.  Patient will perform upper body dressing with Maximal Assist within 7 day(s).  4.  Patient will complete supine>sit with mod assist x2  within 7 day(s).  5.  Patient will tolerate sitting EOB with fair sitting balance in preparation for ADLs within 7 day(s).      Outcome: Progressing     OCCUPATIONAL THERAPY TREATMENT  Patient: Jaime [name] (76 y.o. male)  Date: 06/25/2022  Primary Diagnosis: Septicemia ([facility]) [A41.9]  Gastric perforation ([facility]) [K25.5]  Perforated abdominal viscus [R19.8]  Procedure(s) (LRB):  LAPAROTOMY EXPLORATORY (N/A)  LAPAROSCOPY DIAGNOSTIC (N/A)  ENDOSCOPY OF ILEAL CONDUIT (N/A) 24 Days Post-Op   Precautions: Fall Risk                Chart, occupational therapy assessment, plan of care, and goals were reviewed.    ASSESSMENT  Patient continues to benefit from skilled OT services and is progressing towards goals. Pt continues to demonstrate improvement with sitting balance and UE AROM. Activity tolerance and endurance remains limited and pt fatigues quickly when seated on EOB. Pt presenting with decreased BUE edema and improved digit/wrist/elbow ROM, however shoulder strength remains limited, impacting shoulder AROM. Continue to recommend IPR.              PLAN :  Patient continues to  benefit from skilled intervention to address the above impairments.  Continue treatment per established plan of care to address goals.    Recommend with staff: encourage BUE AROM    Recommend next OT session: continue POC    Recommendation for discharge: (in order for the patient to meet his/her long term goals): Therapy 3 hours/day 5-7 days/week    Other factors to consider for discharge: patient's current support system is unable to meet their requirements for physical assistance, high risk for falls, not safe to be alone, and concern for safely navigating or managing the home environment    IF patient discharges home will need the following DME: continuing to assess with progress       SUBJECTIVE:   Patient stated "Did I really do good?."    OBJECTIVE DATA SUMMARY:   Cognitive/Behavioral Status:  Orientation  Overall Orientation Status: Within Functional Limits  Orientation Level: Oriented X4  Cognition  Overall Cognitive Status: WFL    Functional Mobility and Transfers for ADLs:  Bed Mobility:  Bed Mobility Training  Bed Mobility Training: Yes  Interventions: Verbal cues;Tactile cues  Rolling: Maximum assistance;Assist X2  Supine to Sit: Maximum assistance;Assist X2  Sit to Supine: Maximum assistance;Assist X2  Scooting: Maximum assistance;Assist X2     Transfers:   Art therapist: No      Balance:     Balance  Sitting: Impaired  Sitting - Static: Good (unsupported)  Sitting - Dynamic: Fair (occasional);Unsupported  ADL Intervention:                                                            Toileting: Maximum assistance;Dependent/Total  Toileting Skilled Clinical Factors: donn/doffing socks           Pt tolerated sitting EOB ~7-8 minutes, with primarily CGA for sitting balance. Pt able to remain sitting with limited to no BUE support.     OT instructed pt in cervical and UE A/AROM, including cervical flex/extension, scapular elevation and depression, and shoulder flexion (AAROM), 5x.              Pain Rating:  No pain reported  Pain Intervention(s):         Activity Tolerance:   Fair  and requires rest breaks  Please refer to the flowsheet for vital signs taken during this treatment.    After treatment:   Patient left in no apparent distress in bed, Call bell within reach, Bed/ chair alarm activated, and Side rails x3    COMMUNICATION/EDUCATION:   The patient's plan of care was discussed with: physical therapist and registered nurse    Patient Education  Education Given To: Patient  Education Provided: Role of Therapy;Plan of Care;Transfer Training;Home Exercise Program  Education Method: Verbal;Demonstration  Barriers to Learning: None  Education Outcome: Verbalized understanding;Continued education needed    Thank you for this referral.  Robie Ridge, OT  Minutes: 30

## 2022-06-26 LAB — MAGNESIUM: Magnesium: 2.7 mg/dL — ABNORMAL HIGH (ref 1.6–2.4)

## 2022-06-26 LAB — POCT GLUCOSE
POC Glucose: 206 mg/dL — ABNORMAL HIGH (ref 65–117)
POC Glucose: 152 mg/dL — ABNORMAL HIGH (ref 65–117)
POC Glucose: 152 mg/dL — ABNORMAL HIGH (ref 65–117)
POC Glucose: 131 mg/dL — ABNORMAL HIGH (ref 65–117)

## 2022-06-26 LAB — HEPATIC FUNCTION PANEL
ALT: 26 U/L (ref 12–78)
AST: 47 U/L — ABNORMAL HIGH (ref 15–37)
Albumin/Globulin Ratio: 0.3 — ABNORMAL LOW (ref 1.1–2.2)
Albumin: 1.6 g/dL — ABNORMAL LOW (ref 3.5–5.0)
Alk Phosphatase: 233 U/L — ABNORMAL HIGH (ref 45–117)
Bilirubin, Direct: 1 MG/DL — ABNORMAL HIGH (ref 0.0–0.2)
Globulin: 5.2 g/dL — ABNORMAL HIGH (ref 2.0–4.0)
Total Bilirubin: 1.6 MG/DL — ABNORMAL HIGH (ref 0.2–1.0)
Total Protein: 6.8 g/dL (ref 6.4–8.2)

## 2022-06-26 LAB — CBC
Hematocrit: 26.5 % — ABNORMAL LOW (ref 36.6–50.3)
Hemoglobin: 8.1 g/dL — ABNORMAL LOW (ref 12.1–17.0)
MCH: 30.9 PG (ref 26.0–34.0)
MCHC: 30.6 g/dL (ref 30.0–36.5)
MCV: 101.1 FL — ABNORMAL HIGH (ref 80.0–99.0)
MPV: 11.4 FL (ref 8.9–12.9)
Nucleated RBCs: 0 PER 100 WBC
Platelets: 230 10*3/uL (ref 150–400)
RBC: 2.62 M/uL — ABNORMAL LOW (ref 4.10–5.70)
RDW: 19.8 % — ABNORMAL HIGH (ref 11.5–14.5)
WBC: 18.6 10*3/uL — ABNORMAL HIGH (ref 4.1–11.1)
nRBC: 0 10*3/uL (ref 0.00–0.01)

## 2022-06-26 LAB — BASIC METABOLIC PANEL
Anion Gap: 6 mmol/L (ref 5–15)
BUN: 89 MG/DL — ABNORMAL HIGH (ref 6–20)
Bun/Cre Ratio: 18 (ref 12–20)
CO2: 26 mmol/L (ref 21–32)
Calcium: 8.2 MG/DL — ABNORMAL LOW (ref 8.5–10.1)
Chloride: 102 mmol/L (ref 97–108)
Creatinine: 4.9 MG/DL — ABNORMAL HIGH (ref 0.70–1.30)
Est, Glom Filt Rate: 12 mL/min/{1.73_m2} — ABNORMAL LOW (ref >60)
Glucose: 137 mg/dL — ABNORMAL HIGH (ref 65–100)
Potassium: 4.4 mmol/L (ref 3.5–5.1)
Sodium: 134 mmol/L — ABNORMAL LOW (ref 136–145)

## 2022-06-26 LAB — PHOSPHORUS: Phosphorus: 3.7 MG/DL (ref 2.6–4.7)

## 2022-06-26 MED ORDER — METRONIDAZOLE 500 MG/100ML IV SOLN
500 MG/100ML | Freq: Three times a day (TID) | INTRAVENOUS | Status: AC
Start: 2022-06-26 — End: 2022-07-10
  Administered 2022-06-26 – 2022-07-11 (×41): 500 mg via INTRAVENOUS

## 2022-06-26 MED ORDER — ALTEPLASE 2 MG IJ SOLR
2 | Freq: Once | INTRAMUSCULAR | Status: DC
Start: 2022-06-26 — End: 2022-06-29

## 2022-06-26 MED ORDER — HEPARIN SODIUM (PORCINE) 5000 UNIT/ML IJ SOLN
5000 | Freq: Two times a day (BID) | INTRAMUSCULAR | Status: DC
Start: 2022-06-26 — End: 2022-06-29
  Administered 2022-06-26 – 2022-06-29 (×6): 5000 [IU] via SUBCUTANEOUS

## 2022-06-26 MED ORDER — ALIGN PO CAPS
Freq: Every day | ORAL | Status: AC
Start: 2022-06-26 — End: 2022-08-03
  Administered 2022-06-26 – 2022-08-02 (×34): 1 via ORAL

## 2022-06-26 MED ORDER — CEFTRIAXONE SODIUM 2 G IJ SOLR
2 | INTRAMUSCULAR | Status: DC
Start: 2022-06-26 — End: 2022-07-14
  Administered 2022-06-26 – 2022-07-12 (×16): 2000 mg via INTRAVENOUS

## 2022-06-26 MED ORDER — AMIODARONE HCL 200 MG PO TABS
200 | Freq: Every day | ORAL | Status: DC
Start: 2022-06-26 — End: 2022-08-03
  Administered 2022-06-26 – 2022-08-02 (×36): 200 mg via NASOGASTRIC

## 2022-06-26 MED ORDER — THERAPEUTIC MULTIVIT/MINERAL PO TABS
Freq: Every day | ORAL | Status: DC
Start: 2022-06-26 — End: 2022-08-03
  Administered 2022-06-26 – 2022-08-02 (×34): 1 via NASOGASTRIC

## 2022-06-26 MED FILL — CATHFLO ACTIVASE 2 MG IJ SOLR: 2 MG | INTRAMUSCULAR | Qty: 2

## 2022-06-26 MED FILL — HEPARIN SODIUM (PORCINE) 5000 UNIT/ML IJ SOLN: 5000 UNIT/ML | INTRAMUSCULAR | Qty: 1

## 2022-06-26 MED FILL — CEFTRIAXONE SODIUM 2 G IJ SOLR: 2 g | INTRAMUSCULAR | Qty: 2000

## 2022-06-26 MED FILL — LANTUS 100 UNIT/ML SC SOLN: 100 UNIT/ML | SUBCUTANEOUS | Qty: 1

## 2022-06-26 MED FILL — METRONIDAZOLE 500 MG/100ML IV SOLN: 500 MG/100ML | INTRAVENOUS | Qty: 100

## 2022-06-26 MED FILL — AMIODARONE HCL 200 MG PO TABS: 200 MG | ORAL | Qty: 1

## 2022-06-26 MED FILL — RETACRIT 3000 UNIT/ML IJ SOLN: 3000 UNIT/ML | INTRAMUSCULAR | Qty: 2

## 2022-06-26 MED FILL — RISAQUAD-2 PO CAPS: ORAL | Qty: 1

## 2022-06-26 MED FILL — THERA M PLUS PO TABS: ORAL | Qty: 1

## 2022-06-26 MED FILL — HEPARIN SODIUM (PORCINE) 1000 UNIT/ML IJ SOLN: 1000 UNIT/ML | INTRAMUSCULAR | Qty: 10

## 2022-06-26 NOTE — Other (Signed)
06/26/22 0747   Vital Signs   BP (!) 150/71   Temp 98.5 F (36.9 C)   Pulse 100   Respirations 21   SpO2 95 %   Pain Assessment   Pain Assessment None - Denies Pain   Treatment   Time On 0747   Treatment Goal 2000   Observations & Evaluations   Level of Consciousness 0   Oriented X 2  (confused - talking about puppy on lap)   Heart Rhythm Regular   Respiratory Quality/Effort Labored   O2 Device None (Room air)   Skin Color   (appropriate for ethnicity)   Skin Condition/Temp Dry;Warm   Appetite Other (Comment)  (tube feed)   Abdomen Inspection Rounded   Bowel Sounds (All Quadrants) Active   Edema Generalized   Edema Generalized Pitting   RUE Edema +3   LUE Edema +1   RLE Edema Pitting;+3   LLE Edema +3;Pitting   Technical Checks   Dialysis Machine No. 01   RO Machine Number [id number]   Dialyzer Lot No. [id number]   Tubing Lot Number [id number]   All Connections Secure Yes   NS Bag Yes   Saline Line Double Clamped Yes   Dialyzer Revaclear 300   Prime Volume (mL) 200 mL   ICEBOAT I;C;E;B;O;A;T   RO Machine Log Sheet Completed Yes   Machine Alarm Self Test Completed;Passed   Child psychotherapist Function   Extracorporeal Chemical engineer Conductivity 13.8   Manual Conductivity 13.7   Manual Ph 7.4   Bleach Test (Neg) Yes   Bath Temperature 98.6 F (37 C)   Treatment Initiation   Dialyze Hours 3   Treatment  Initiation Universal Precautions maintained;Lines secured to patient;Connections secured;Prime given;Venous Parameters set;Arterial Parameters set;IT consultant engaged;Saline line double clamped;Revaclear Dialyzer;REV-300   Dialysis Bath   K+ (Potassium) 4  (changed to 3 per Dr. Cordie Grice at [phone])   Ca+ (Calcium) 2.5   Na+ (Sodium) 140   HCO3 (Bicarb) 35   Bicarbonate Concentrate Lot No. [id number]   Acid Concentrate Lot No. [id number]   Hemodialysis Central Access Right Neck   Placement Date/Time: 06/09/22 1830   Present on Admission/Arrival: No  Inserted by: dr. Rutherford Limerick   Orientation: Right  Access Location: Neck   Continued need for line? Yes   Site Assessment Clean, dry & intact   CVC Lumen Status Flushed;Infusing   Venous Lumen Status Flushed;Infusing   Arterial Lumen Status Flushed;Infusing   Line Care Connections checked and tightened;Chlorhexidine wipes;Ports disinfected   Dressing Type Transparent   Date of Last Dressing Change 06/26/22   Dressing Status New dressing applied;Clean, dry & intact   Dressing Intervention Dressing changed;New   Primary RN SBAR: Esperanza Richters, RN  Patient Education: Procedural, line care and infection prevention.  Hepatitis B Surface Ag   Date/Time Value Ref Range Status   06/01/2022 10:08 PM <0.10 Index Final     Hep B S Ab   Date/Time Value Ref Range Status   06/01/2022 10:08 PM <3.10 mIU/mL Final      0747 - Treatment started per MD orders.  5188 - Dr. Cordie Grice at bedside. Orders changed to 3K bath.       06/26/22 1047   Vital Signs   BP 121/70   Temp 97.9 F (36.6 C)   Pulse (!) 103   Respirations 22   SpO2 94 %   Pain Assessment   Pain Assessment None - Denies Pain  Post-Hemodialysis Assessment   Post-Treatment Procedures Blood returned;Catheter capped, clamped and heparinized x 2 ports   Machine Disinfection Process Acid/Vinegar Clean;Heat Disinfect;Exterior Engineer, agricultural Volume (ml) 300 ml   Blood Volume Processed (Liters) 51.5 L   Dialyzer Clearance Lightly streaked   Duration of Treatment (minutes)   (3.5hrs)   Hemodialysis Intake (ml) 500 ml   Hemodialysis Output (ml) 2500 ml   NET Removed (ml) 2000   Tolerated Treatment Good   Patient Response to Treatment tolerated well   Bilateral Breath Sounds Diminished   Edema Generalized   RUE Edema +3   LUE Edema +1   RLE Edema Pitting;+3   LLE Edema Pitting;+3   Time Off 1047   Patient Disposition Other (Comment)  (bedside tx)   SBAR: Lenn Sink, RN  Dressing changed and dated 8.25.23. Hep lock x 2. Bedrails up and bed locked.

## 2022-06-26 NOTE — Care Coordination-Inpatient (Signed)
Transition of Care Plan:     RUR: 19% (moderate RUR)  Prior Level of Functioning:  Independent - lives with wife in [place].  Disposition: [facility], [place] - family was previously notified to complete Means Test Forms for financial eligibility vs. Placement in [place] area  If SNF or IPR: Date FOC offered: TBD  Date FOC received: TBD  Accepting facility: TBD  Date authorization started with reference number: TBD  Date authorization received and expires: TBD  Follow up appointments: PCP/Specialist  DME needed: TBD  Transportation at discharge: [facility] anticipated  IM/IMM Medicare/Tricare letter given: 2nd IM needed prior to discharge.  Is patient a Veteran and connected with VA? yes              If yes, was [employer] transfer form completed and VA notified? No.  Caregiver Contact: Mansel Strother - Wife - [phone]  Discharge Caregiver contacted prior to discharge? Patient to contact.  Care Conference needed? No.  Barriers to discharge: nephrology/new HD?, podiatry clearance, surgical clearance, lines/drains, placement choices           Ronnell Freshwater, BSN, RN     Care Management  [phone]

## 2022-06-26 NOTE — Plan of Care (Addendum)
Speech Language Pathology  Flexible Endoscopic Evaluation of Swallowing-FEES  Patient: Jaime [name] 76 y.o. male)  Date: 06/26/2022  Primary Diagnosis: Septicemia ([place]) [A41.9]  Gastric perforation ([place]) [K25.5]  Perforated abdominal viscus [R19.8]  Procedure(s) (LRB):  LAPAROTOMY EXPLORATORY (N/A)  LAPAROSCOPY DIAGNOSTIC (N/A)  ENDOSCOPY OF ILEAL CONDUIT (N/A) 25 Days Post-Op   Precautions: aspiration,  Fall Risk                  ASSESSMENT :  Based on the objective data described below, the patient presents with mild pharyngeal dysphagia and subjectively presents with functional oral stage of the swallow.  Patient with seemingly adequate oral stage of the swallow with increased time for mastication, oral clearance, but generally intact/functional.  Patient with clear, bubbly/foamy secretions in valleculae and pyriform sinus that were significantly reduced compared to previous FEES study. Pharyngeal phase of swallow characterized by slightly delayed, but generally timely pharyngeal swallow initiation, full epiglottic inversion, slightly reduced pharyngeal squeeze.  Penetration of thin liquids noted to the level of the folds with single sips of thin liquids that cleared with the force of the swallow.  Aspiration noted with successive sips of thin liquids that patient was sensate to.  Patient with residue with puree in the valleculae, pyriform sinus, and posterior pharyngeal wall that reduced with second swallow, but cleared with liquid flush.  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  Sensation appears intact for both penetration and aspiration.  Patient with frequent belching throughout study, but only one instance of slight proximal escape noted from below PES.  Of note, patient with irregular free edge to posterior edge of R vocal fold with redness throughout both folds.  Question if irritation a result of constant coughing vs. Recent intubation x2 Recommend initiation full liquid diet with aim  to progress diet as tolerated and dysphagia therapy to include pharyngeal strengthening exercises and therapeutic p.o. trials with SLP.      Patient will benefit from skilled intervention to address the above impairments.     Images taken from FEES:    Residue post puree in both posterior pharyngeal wall, pyriform sinus, and valleculae as well as residue gathering around dobhoff tube.       PLAN :  Recommendations and Planned Interventions:  Diet: Full liquid  --meds via dobhoff tube  --strict oral care 2-3x/day  --fully upright when consuming p.o.  --single sips only  Acute SLP Services: Yes, patient will be followed by speech-language pathology 4x/week to address goals. Patient's rehabilitation potential is considered to be Good.  Discharge Recommendations: Yes, recommend SLP treatment at next level of care     SUBJECTIVE:   Patient stated "that's gross".    OBJECTIVE:   History reviewed. No pertinent past medical history.  Past Surgical History:   Procedure Laterality Date    BLADDER SURGERY N/A 06/01/2022    ENDOSCOPY OF ILEAL CONDUIT performed by Melene Plan, MD at [facility]    CT VISCERAL PERCUTANEOUS DRAIN  06/05/2022    CT VISCERAL PERCUTANEOUS DRAIN 06/05/2022 [facility] RAD CT    LAPAROSCOPY N/A 06/01/2022    LAPAROSCOPY DIAGNOSTIC performed by Melene Plan, MD at [place] N/A 06/01/2022    LAPAROTOMY EXPLORATORY performed by Melene Plan, MD at [facility]     Prior Level of Function/Home Situation:   Social/Functional History  Lives With: Spouse  Type of Home: House  Home Layout: One level  Home Equipment: None  ADL Assistance: Independent  Ambulation Assistance: Independent  Transfer Assistance: Independent  Active Driver: Yes  Diet prior to admission: regular  Current Diet:  NPO     Cognitive and Communication Status:  Neurologic State: Alert  Orientation Level: Oriented to person, Oriented to place, and Oriented to situation  Cognition: Follows commands    History/indication for procedure:  dysphagia  Lidocaine used: No  Nostril used: right  Scope Used: Ambu disposable scope  Feeding Tube Present in Nare: Yes, NG tube  Adverse Reaction: No  Respiratory status: room air        Part I:  Anatomy:       General Comments:      Palate:   WFL   Base of tongue:   Impaired: cobblestoning on base of tongue   Valleculae:   WFL   Epiglottis:   WFL   Arytenoids:   WFL   False vocal folds:   WFL   Vocal folds:   Impaired: irregular free edge to posterior edge of R vocal fold with redness throughout both folds.  Question if irritation a result of constant coughing vs. Intubation x2 Pyriform sinus:   WFL         Part II:  Laryngeal Function:    Adduction:   Full   Abduction:   Full   Arytenoid movement:   Symmetric Vocal quality:   Hoarse -  mildly          Part III:  Secretions:    [place] Secretion Rating (0-7): 0     Location:  0 - Nil significant pooled secretions in pyriform fossae or laryngeal vestibule Amount:   0 - Nil significant pooled secretions in pyriform fossae (0-20%) Response*:  0 - Secretions in pyriform fossae or laryngeal vestibule effectively cleared   Comments (e.g., quality/texture/color): clear, foamy secretions noted in valleculae and pyriform sinuses     *Normal airway responses in the pharynx or laryngeal vestibule may include spontaneous coughing, throat clearing, and/or swallowing        Part IV:  Swallow Trials:    Subjective comments regarding oral phase of swallow: slow but adequate    Comments regarding esophageal phase of swallow: generally functional    Consistency: Ice chips and Thin liquid single sips  Position of Bolus Pre-Swallow: Presumed base of tongue and Valleculae  [place] Pharyngeal Residue Severity Rating Scale: Valleculae residue: 2 - trace; 1-5%, trace coating of the mucosa and Pyriform sinus residue: 2 - trace; 1-5%, trace coating of the mucosa  Spontaneously Cleared: Yes  Penetration: Yes  Aspiration: No  Response: Cough effective  Rosenbek Penetration-Aspiration  Scale: 2 - Material enters the airway, remains above the vocal folds and is ejected from the airway  Compensatory Strategy: No  Effectiveness of Compensatory Strategy: n/a      Consistency: Thin liquid  Position of Bolus Pre-Swallow: Presumed base of tongue and Valleculae  [place] Pharyngeal Residue Severity Rating Scale: Valleculae residue: 2 - trace; 1-5%, trace coating of the mucosa and Pyriform sinus residue: 2 - trace; 1-5%, trace coating of the mucosa  Spontaneously Cleared: No  Penetration: Yes  Aspiration: Yes  Response: Cough effective  Rosenbek Penetration-Aspiration Scale: 7 - Material enters the airway, passes below the vocal folds, and is not ejected from the trachea, despite effort  Compensatory Strategy: Yes: single sips eliminates aspiration  Effectiveness of Compensatory Strategy: Effective in clearing penetration but not aspirated material    Consistency: Puree  Position of Bolus Pre-Swallow: Valleculae  [place] Pharyngeal Residue Severity Rating  Scale: Valleculae residue: 3 - mild; 5-25%, epiglottic ligament visible and Pyriform sinus residue: 3 - mild; 5-25%, up wall to quarter full  Spontaneously Cleared: No - cleared with liquid flush  Penetration: No  Aspiration: No  Response: n/a  Rosenbek Penetration-Aspiration Scale: 1 - Material does not enter the airway  Compensatory Strategy: Yes: liquid flush post puree bite  Effectiveness of Compensatory Strategy: Effective in clearing residue in valleculae, posterior pharyngeal wall and pyriform sinus      Consistency: Solid  Position of Bolus Pre-Swallow: Presumed base of tongue  [place] Pharyngeal Residue Severity Rating Scale: Valleculae residue: 2 - trace; 1-5%, trace coating of the mucosa and Pyriform sinus residue: 2 - trace; 1-5%, trace coating of the mucosa  Spontaneously Cleared: No  Penetration: No  Aspiration: No  Response: No response  Rosenbek Penetration-Aspiration Scale: 1 - Material does not enter the airway  Compensatory Strategy:  No  Effectiveness of Compensatory Strategy: n/a      Consistency: Mildly thick liquid  Position of Bolus Pre-Swallow: Presumed base of tongue and Valleculae  [place] Pharyngeal Residue Severity Rating Scale: Valleculae residue: 1 - none; 0%, no residue and Pyriform sinus residue: 1 - none; 0%, no residue  Spontaneously Cleared: N/a  Penetration: No  Aspiration: No  Response: n/a  Rosenbek Penetration-Aspiration Scale: 1 - Material does not enter the airway  Compensatory Strategy: n/a  Effectiveness of Compensatory Strategy: n/a    Dysphagia Severity Rating:   Oral phase: Mild  Pharyngeal phase: Mild-Moderate      NOMS:   The NOMS functional outcome measure was used to quantify this patient's level of swallowing impairment.  Based on the NOMS, the patient was determined to be at level 3 for swallow function       NOMS Swallowing Levels:  Level 1 (CN): NPO  Level 2 (CM): NPO but takes consistency in therapy  Level 3 (CL): Takes less than 50% of nutrition p.o. and continues with nonoral feedings; and/or safe with mod cues; and/or max diet restriction  Level 4 (CK): Safe swallow but needs mod cues; and/or mod diet restriction; and/or still requires some nonoral feeding/supplements  Level 5 (CJ): Safe swallow with min diet restriction; and/or needs min cues  Level 6 (CI): Independent with p.o.; rare cues; usually self cues; may need to avoid some foods or needs extra time  Level 7 ([place]): Independent for all p.o.  ASHA. (2003). National Outcomes Measurement System (NOMS): Adult Speech-Language Pathology User's Guide.       COMMUNICATION/EDUCATION:   Patient educated on results of FEES    The patient's plan of care including recommendations, planned interventions, and recommended diet changes were discussed with: Registered nurse    Patient/family have participated as able in goal setting and plan of care    Thank you,  Tora Kindred, SLP    Was present with SLP for the entirety of FEES in supervisory capacity.  Agree with  assessment and recommendations as stated above.    Thank you,  Norina Buzzard, MEd, CCC-SLP  Speech-Language Pathologist        Problem: SLP Adult - Impaired Swallowing  Goal: By Discharge: Advance to least restrictive diet without signs or symptoms of aspiration for planned discharge setting.  See evaluation for individualized goals.  Description: Speech Pathology Goals  1. Patient will participate in FEES to objectively assess swallow function prior to PO diet initiation. Goal initiated 06/22/22. Goal met 06/22/22.   2. Patient will tolerate therapeutic PO trials of thin  liquids (e.g., ice chips, small sips of water) and purees without clinical s/s of aspiration or respiratory decline. Goal initiated 06/22/22.  3. Patient will complete pharyngeal strengthening exercises (e.g., effortful swallow) with min cues for technique. Goal initiated 06/22/22.   4. Patient will complete repeat instrumental swallow study to determine readiness for PO diet when clinically appropriate. Goal initiated 06/22/22. MET 06/26/22.    5. Patient will tolerate least restrictive diet with no overt s/s aspiration within 7 days. Initiated 06/26/22.  Outcome: Progressing

## 2022-06-26 NOTE — Progress Notes (Addendum)
Infectious Disease Progress    Impression    Sepsis  S/p septic shock      Acute hypoxic respiratory failure  Re intubated 8/13, extubated 8/19  Initially intubated 7/31, s/p extubation 8/7  CXR  8/20 + + for atelectasis  Resp cultures  8/13+ for light yeast  Apparent C.albicans/ dubliensis    E. coli bacteremia  Blood cultures 7/31+ for E. coli   2/2 LAC (pan sensitive)   Negative repeat cultures 8/4       Acute abdomen  Pneumoperitoneum  S/p diagnostic laparoscopy, laparotomy  EGD 7/31  Findings of cloudy fluid in right upper quadrant, yellow/white  Inflammatory peel over lesser curve of stomach, inferior lobe of liver  No gross perforation identified  Pancreas and retroperitoneum appeared edematous  Intra-Op fluid culture + for E. Coli (pansensitive)    Hepatic abscess  Gas and fluid containing collection 7.1 x 10.8 x 5.4 cm  In anterior left lobe of liver, patchy areas of hypodensity right lobe  4 x 4.5 x 5.5 cm collection  S/p drainage by IR 8/4.Cultures + for  few E.coli  Repeat CT 8/7 shows reduction in size of hepatic abscess  Plan was for ceftriaxone IV x6 weeks end date 9/15, Flagyl  Until 8/18  Repeat CT 8/13 + for    S/p percutaneous drainage of left hepatic abscess with  Minimal fluid remaining around catheter, suggestion of peripancreatic edema, small bilateral pleural effusions  Repeat CT abdomen pelvis 8/16+ for increased peripancreatic edema suspicious for pancreatitis, small residual fluid collection in left hepatic lobe  Lipase 737      Leukocytosis worsening  WBC 18.6  S/p abdominal wound dehiscence  Clean, no purulence per wound care  Cultures 8/17, 8/18-NG.    Gangrene, discoloration of toes  Persist      AKI  Cr 4.9 on HD    Coagulopathy  Improving    Thrombocytopenia  resolved      Transaminitis  Improving    Hyperbilirubinemia   Bilirubin 1.7    Diabetes type 2  Hyperglycemia  A1c 7.7    Diarrhea  FMS present    Obesity  BMI 34.76  Plan  Continue ceftriaxone 2 g IV daily  Pt will need  a  total of  4-6 weeks of coverage of  liver abscess planned end date 9/15. (Currently D21/42)  De-escalate to p.o. therapy at 4 weeks per clinical course  Re start Flagyl IV, monitor wbc  Patient would require repeat imaging prior to  De-escalation of therapy to oral antibiotics  ID service to follow.    Please contact ID on call with questions, concerns over the weekend.        Extensive review of chart notes, labs, imaging, cultures done  Additionally review of done:  D/w ICU team, Dr. Salena Saner    Patient seen today.  Awake, talking  D/w RN . Drain out put+  Accordion draining, Abdominal wound clean    History reviewed. No pertinent past medical history.    Past Surgical History:   Procedure Laterality Date    BLADDER SURGERY N/A 06/01/2022    ENDOSCOPY OF ILEAL CONDUIT performed by Guinevere Ferrari, MD at [facility]    CT VISCERAL PERCUTANEOUS DRAIN  06/05/2022    CT VISCERAL PERCUTANEOUS DRAIN 06/05/2022 [facility] RAD CT    LAPAROSCOPY N/A 06/01/2022    LAPAROSCOPY DIAGNOSTIC performed by Guinevere Ferrari, MD at [facility]    LAPAROTOMY N/A 06/01/2022    LAPAROTOMY EXPLORATORY  performed by Guinevere Ferrari, MD at [facility]       Allergies   Allergen Reactions    Augmentin [Amoxicillin-Pot Clavulanate] Hives     Tolerated ceftriaxone 06/2022    Codeine      Unknown reaction       Social Connections: Not on file       No family status information on file.           Review of Systems - Negative except those mentioned in H&P      PHYSICAL EXAM:  General:          Awake, in no distress  EENT:              EOMI. Anicteric sclerae. MMM  Resp:               CTA bilaterally, no wheezing or rales.  No accessory muscle use  CV:                  Regular  rhythm,  No edema  GI:                   Soft, Non distended, Non tender.  +Bowel sounds  Neurologic:      Alert and oriented X 3, normal speech,   Psych:             Good insight. Not anxious nor agitated  Skin:                No rashes.  No jaundice.  Extremities :  No edema, discoloration of  toes++.      Waymon Amato, MD Jerrel Ivory

## 2022-06-26 NOTE — Other (Signed)
Jaime [name]  [facility]  DIABETES MANAGEMENT CONSULT    Consulted by Provider for advanced nursing evaluation and care for inpatient blood glucose management.    Evaluation and Action Plan   This 76 year old Caucasian male was admitted with RUQ pain and underwent laparotomy 06/01/22. Was in septic shock with multi-organ failure. Several episodes of Vtach. Remained on vent support, pressors & CRRT. A new finding of liver abscess noted on CT; drain placed.    There is concern for LLE viability due to diminished pedal pulses. Ischemia of 3 toes on right & 4 toes on the left.  Per vascular, he was not a candidate for endovascular or surgical revascularization at this time. Moved out of ICU but experienced an acute decline (hypotension, arrhythmia & AMS) 06/14/22 & was returned to ICU. Had to be re-intubated but is now off sedation & pressor support. Infectious disease continues to follow & remains on multiple antibiotics. Feeding by Dobhoff. Podiatry involved & awaiting demarcation of foot wounds before proceeding. Renal issues addressed per nephrology. Plans to move back to [facility] area are being coordinated by SW & wife; will need dialysis, wound care & nutrition.    As for BG management in this patient with known Type 2 diabetes, treated with oral agent. Has required insulin to offset steroids during this hospital stay and now needs insulin to override TF. Bgs have been in target with 16 units of Lantus insulin since 06/19/22. Amount of CHO per day was advanced 06/23/22 so insulin dose increased 06/24/22. Bgs remain in target!    Management Rationale Action Plan   Medication   TF Running at goal rate of 45cc/hr (173 CHO/D)   Continue Lantus insulin 18 units D    LOW corrective approach     Additional orders               Initial Presentation   Jaime [name] is a 76 y.o. male admitted 05/31/22 from ER after experiencing RUQ pain associated with generalized weakness, nausea & vomiting for several days  PTA. AMS+. Hypotension+. Fever+. Dyspneic+. He is visiting from [place].  Ectopic atrial tachycardia  ER exam:  Cardiovascular:      Rate and Rhythm: Regular rhythm. Tachycardia present.   Pulmonary:      Comments: He is tachypneic, coarse breath sounds on inspiration at bilateral bases  Abdominal:      Palpations: Abdomen is soft.      Comments: Tender to palpation diffusely in the right sided abdomen without guarding or rebound tenderness   LAB: WBC 16.6. Normal H&H. Low platelets.BG 272/AG 17. AKI. Elevated liver enzymes. NT pro-BNP 20,980. Lipase >3000.     CXR:   Right IJ catheter satisfactory position without pneumothorax. ET   tube and NG tube as above   CT Head: Negative  CT ABd:   Extraluminal air bubbles in the right upper quadrant concerning for gastric   perforation. Inflammatory changes are noted about the tail of the pancreas   extending into the left anterior pararenal space, please correlate with any   evidence of acute pancreatitis. Bilateral lower lobe infiltrates.     HX: DM    INITIAL DX: Septicemia (HCC) [A41.9]  Gastric perforation (HCC) [K25.5]  Perforated abdominal viscus [R19.8]     Current Treatment     TX: 06/01/22 LAPAROTOMY EXPLORATORY  LAPAROSCOPY DIAGNOSTIC ENDOSCOPY OF ILEAL CONDUIT  EGD    Hospital Course   Clinical progress has been complicated by multi-organ failure.  06/01/22 Dialysis  06/02/22 In septic shock with multi-organ failure. On vent support, sedation, pressor support and bicarb infusion. On Abx. Two runs of VT this morning requiring defibrillation; cardiology conversation anticipated. CRRT+  06/03/22 Remains on vent support C vent Fi02 50%/Peep 7. Low grade fever; on Abx. Steroids+. Afib/bradycardic. Pressors+. CRRT+.   06/04/22 Remains on vent support and pressors. On Abx. Concern for LLE. CRRT. CT: Enlarged left hepatic abscess contain gas with other areas of probable abscess/phlegmonous infection.  06/05/22 On AC vent support. On Amio, vaso, levo & Fentanyl infusions.  OG draining green fluid. CRRT+.  Vascular: Patient remains at risk for progressive deterioration and limb loss but needs to be weaned from pressor support before intervention is safe.  06/08/22 On Spon vent support; plans to extubate after CT today. Fentanyl pushes. H&H low. CRRT+. Mottled & blue toes bilaterally. Vascular AKI study to be completed  06/09/22 Alert. 02NC. Amio infusion+. Pedal pulses returning (no need to use doppler)  06/10/22 Alert. 02NC. NSR. Off pressors. TF running. Swallow eval today => may begin CL. Undergoing hemodialysis now. Continued cyanosis of bilateral toes.  06/11/22 Alert & conversational today  06/12/22 A&O. Failed swallow; allowed ice chips. Podiatry to see for LE ischemia  06/15/22 Intubated & sedated on pressor support. TF restarted at trickle rate. Dialysis planned per nephrology.  06/16/22 Eyes open. Intubated & sedated on Precedex. ST. Hypotensive on epi infusion. Amio+. TF running @10cc /hr. CVVH+.   06/17/22 Eyes closed. Intubated & sedated on Precedex. Spon vent Fi02 50/Peep 5; trying to wean. NSR. Amio +. TF @goal  rate. CVVH+. Wound care involved  06/18/22 Eyes open. Following conversation. Raising left arm. Intubated on spon vent support & mildly sedated; will try again to wean from vent. TF running at goal rate. May get trached. Blood transfusion+  06/19/22 On sp vent support. Concerning trach. Off pressor. Dobhoff with TF. Anuric. HD today  06/22/22. A&O. Off vent & pressor support. TF infusing without issue via Dobhoff. Speech swallow evaluation today. 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 Case management continues to pursue [facility] placement in [place] where he lives  06/23/22 A&O. Dobhoff for nutrition; CHO advancing today. Status of toe ischemia unchanged  06/24/22 Per nursing, "Patient is very agitated and angery. Patient refused to get any care at this point and wanted to be left alone. CCL tried 06/25/22  guided IV on patient. Patient is very upset since PICC team already had an unsuccessful attempt. Rn Convinced the patient that he will be left along for an hour or so to give him a break and will check upon patient later again. Patient agreed."  06/25/22 A&O. Nutrition via Dobhoff. Working with PT/OT. Multiple services participating in care  06/26/22 A&O. Nutrition via Dobhoff at same rate. PT/OT    Diabetes History   Type 2 diabetes on metformin therapy  Checks Bgs and they are in the 100s  Is physically active    Diabetes-related Medical History deferred    Diabetes Medication History - based on PTA list  Drug class Currently in use   Biguanide [x]  Metformin (Glucophage)  []  Metformin ER (Glucophage XL)     Diabetes self-management practices: deferred  Overall evaluation:    [x]  Unknown A1c     Subjective   NA     Objective   Physical exam  General Obese male who is in no acute distress  Neuro  Alert & oriented  Vital  Signs   Vitals:    06/26/22 1400   BP: 124/69   Pulse: 100   Resp:    Temp:    SpO2:      Extremities    Diabetic foot exam:    Left Foot     Visual Exam: Cool & dry. Thickened toenails. 3 toes gangrenous    Pulse DP: +1   Right Foot   Visual Exam: Cool & dry. Thickened toenails . 4 toes gangrenous   Pulse DP: +1      Laboratory  Recent Labs     06/24/22  0315 06/25/22  1214 06/26/22  0511   WBC 16.4* 17.1* 18.6*   HGB 8.2* 7.9* 8.1*   HCT 26.0* 25.3* 26.5*   MCV 99.2* 100.8* 101.1*   PLT 197 205 230       Recent Labs     06/24/22  0315 06/25/22  0314 06/26/22  0511   NA 133* 134* 134*   K 3.9 4.2 4.4   CL 100 101 102   CO2 24 26 26    PHOS 3.7  --  3.7   BUN 86* 65* 89*   CREATININE 4.61* 3.69* 4.90*       Lab Results   Component Value Date    ALT 26 06/26/2022    AST 47 (H) 06/26/2022    ALKPHOS 233 (H) 06/26/2022    BILITOT 1.6 (H) 06/26/2022     No results found for: TSH, TSHREFLEX, TSHFT4, TSHELE, TSH3GEN, TSHHS   Lab Results   Component Value Date    LABA1C 7.7 (H) 06/02/2022     Factors impacting  BG management  Factor Dose Comments   Nutrition:  TF   @target  rate of 45cc/hr  (173 CHO/D)    Drug  EPO     PRBC 06/18/22   A1cs inaccurate   Pain Fent PRN    Infection Cefepime Q24 hrs  Flagyl Q12 hrs  Diflucan Q48 hrs WBC rising   Other:   Kidney function  Liver function   AKI  AST elevated        Blood glucose pattern      Significant diabetes-related events  05/31/22 Admission BG 233  06/01/22 Dex 4mg  => BG 100s => HC started  06/02/22 Bgs into 200s  06/03/22 Bgs in 170-180s  06/04/22 Bgs rising abit. HC reduced frequency today. Trickle feeds to begin  06/05/22 Bgs rising into 200s  06/08/22 Bgs in target  06/09/22 Bgs rising with start of TF at higher rate  06/10/22 Bgs rose abit with TF @30cc /hr  06/11/22 Bgs 108, 113. No insulin given  06/12/22 No insulin given. No nutrition. AKI  06/14/22 Acute decline in health status after dialysis => back to ICU  06/15/22 TF restarted  06/16/22 TF still at 10cc/hr  06/17/22 TF at goal rate. Bgs 190s  06/18/22 Tfs running at goal rate. FBG 183  06/19/22 FBG 170 but higher during the day  06/22/22 Bgs 130-170s  06/23/22 Bgs in acceptable range but CHO load increasing  06/24/22 Bgs 150-180s  06/25/22 Bgs in target  06/26/22 Bgs in target    Assessment and Nursing Intervention   Nursing Diagnosis Risk for unstable blood glucose pattern   Nursing Intervention Domain 5250 Decision-making Support   Nursing Interventions Examined current inpatient diabetes/blood glucose control   Explored factors facilitating and impeding inpatient management  Explored corrective strategies with patient and responsible inpatient provider   Informed patient of rational for insulin strategy while hospitalized  Billing Code(s)   []  IP subsequent hospital care - 50 minutes   []  99232 IP subsequent hospital care - 35 minutes   [x]  99231 IP subsequent hospital care - 25 minutes   []  99221 IP initial hospital care - 40 minutes     Before making these care recommendations, I personally reviewed the hospitalization record,  including notes, laboratory & diagnostic data and current medications, and examined the patient at the bedside (circumstances permitting) before determining care. More than fifty (50) percent of the time was spent in patient counseling and/or care coordination.  Total minutes: 25    [id number], APRN - CNS  Clinical Nurse Specialist - Diabetes & endocrine disorders  [facility]  Access via [employer]

## 2022-06-26 NOTE — Progress Notes (Signed)
Physical Therapy    Chart reviewed and attempted visit but patient currently receiving HD at bedside.  Will defer and continue to follow.    Pia Mau

## 2022-06-26 NOTE — Progress Notes (Signed)
Comprehensive Nutrition Assessment    Type and Reason for Visit:  Reassess    Nutrition Recommendations/Plan:   Continue Nepro TF @ 45 ml/hr x 24 hr + Prosource BID + 50 ml q4h which provide daily approx. 2140 kcals/127 g protein/173CHO/1085 ml free water.  Continue full liquids as tolerated per SLP.  Please document % meals and supplements consumed in flowsheet I/O's under intake.       Malnutrition Assessment:  Malnutrition Status:  Insufficient data (06/04/22 1319)      Nutrition Assessment:    Chart reviewed; med noted for sepsis 2' perf vicus, s/p lap 7/31, liver abscess s/p IR drainage, gangrene both feet. Hx of dysphagia, s/p FEES study with continued small bore feeding tube. Pt continues to receive Nepro tube feedings at goal rate 45 ml/hr x 24 hr + Prosource BID. Additionally, SLP started pt on a FLD today. Multiple nurses at bedside and wound care.    Nutrition Related Findings:    Labs: MG+ 2.7, Phos WNL, BG stable Wound Type: Surgical Incision, Wound Vac, Deep Tissue Injury       Current Nutrition Intake & Therapies:    Average Meal Intake: NPO     ADULT TUBE FEEDING; Nasojejunal; Renal Formula; Continuous; 45; No; 50; Q 4 hours; Protein; Prosource BID  DIET ONE TIME MESSAGE;  ADULT DIET; Full Liquid    Anthropometric Measures:  Height: 172.7 cm (5' 8" )  Ideal Body Weight (IBW): 154 lbs (70 kg)       Current Body Weight: 239 lb 3.2 oz (108.5 kg), 154.2 % IBW. Weight Source: Bed Scale  Current BMI (kg/m2): 36.4                          BMI Categories: Obese Class 2 (BMI 35.0 -39.9) (not reliable due to anasarca)    Estimated Daily Nutrient Needs:  Energy Requirements Based On: Formula  Weight Used for Energy Requirements: Current  Energy (kcal/day): MSJ 2150 (1795 x 1.2)  Weight Used for Protein Requirements: Ideal  Protein (g/day): 119-140g (1.7-2gPro/kg IBW)  Method Used for Fluid Requirements: Other (Comment)  Fluid (ml/day): per MD    Nutrition Diagnosis:   Inadequate protein-energy intake related to  impaired respiratory function as evidenced by NPO or clear liquid status due to medical condition    Nutrition Interventions:   Food and/or Nutrient Delivery: Continue Current Tube Feeding, Continue Current Diet  Nutrition Education/Counseling: No recommendation at this time  Coordination of Nutrition Care: Continue to monitor while inpatient, Interdisciplinary Rounds       Goals:  Previous Goal Met: Goal(s) Achieved  Goals: Tolerate nutrition support at goal rate, by next RD assessment (with lytes WNL and no signs/sx of intolerance)       Nutrition Monitoring and Evaluation:   Behavioral-Environmental Outcomes: None Identified  Food/Nutrient Intake Outcomes: Enteral Nutrition Intake/Tolerance  Physical Signs/Symptoms Outcomes: Biochemical Data, Nutrition Focused Physical Findings, Skin, Weight, GI Status, Hemodynamic Status, Fluid Status or Edema    Discharge Planning:    Too soon to determine     Berenice Primas, RD  Contact: [phone]

## 2022-06-26 NOTE — Progress Notes (Signed)
NAME: Jaime [name]        DOB:  1946/05/16        MRN:  [id number]                     Assessment   :                                               Plan:  AKI  Pancreatitis  Hyponatremia  DM  anemia Seen on HD at 0830.  BP stable.    Watch for renal recovery.    H/H not at goal.  Continue ESA.    Will see again Monday.  Please call if any questions.              Subjective:     Chief Complaint:   No complaints.    Review of Systems:    Symptom Y/N Comments  Symptom Y/N Comments   Fever/Chills    Chest Pain     Poor Appetite    Edema     Cough    Abdominal Pain     Sputum    Joint Pain     SOB/DOE    Pruritis/Rash     Nausea/vomit    Tolerating PT/OT     Diarrhea    Tolerating Diet     Constipation    Other       Could not obtain due to:      Objective:     VITALS:   Last 24hrs VS reviewed since prior progress note. Most recent are:  Vitals:    06/26/22 0945   BP: 128/81   Pulse: (!) 102   Resp:    Temp:    SpO2:        Intake/Output Summary (Last 24 hours) at 06/26/2022 0956  Last data filed at 06/26/2022 4098  Gross per 24 hour   Intake 965 ml   Output 950 ml   Net 15 ml        Telemetry Reviewed:     PHYSICAL EXAM:  General: NAD  1+ edema      Lab Data Reviewed: (see below)    Medications Reviewed: (see below)    PMH/SH reviewed - no change compared to H&P  ________________________________________________________________________  Care Plan discussed with:  Patient     Family      RN     Care Manager                    Consultant:          Comments   >50% of visit spent in counseling and coordination of care       ________________________________________________________________________  Marina Gravel, MD     Procedures: see electronic medical records for all procedures/Xrays and details which  were not copied into this note but were reviewed prior to creation of Plan.      LABS:  Recent Labs     06/25/22  1214 06/26/22  0511   WBC 17.1*  18.6*   HGB 7.9* 8.1*   HCT 25.3* 26.5*   PLT 205 230       Recent Labs     06/24/22  0315 06/25/22  0314 06/26/22  0511   NA 133* 134* 134*   K 3.9 4.2 4.4   CL 100 101 102  CO2 24 26 26    BUN 86* 65* 89*   MG 2.5*  --  2.7*   PHOS 3.7  --  3.7       Recent Labs     06/24/22  0315 06/26/22  0511   GLOB 4.6* 5.2*       No results for input(s): INR, APTT in the last 72 hours.    Invalid input(s): PTP   No results for input(s): TIBC, FERR in the last 72 hours.    Invalid input(s): FE, PSAT   No results found for: FOL, RBCF   No results for input(s): PH, PCO2, PO2 in the last 72 hours.  No results for input(s): CPK, CKMB, TROPONINI in the last 72 hours.  No components found for: [facility]  @labua @    MEDICATIONS:  Current Facility-Administered Medications   Medication Dose Route Frequency    cefTRIAXone (ROCEPHIN) 1,000 mg in sodium chloride 0.9 % 50 mL IVPB (mini-bag)  1,000 mg IntraVENous Q24H    insulin glargine (LANTUS) injection vial 18 Units  18 Units SubCUTAneous Nightly    lidocaine 2 % injection 20 mL  20 mL IntraDERmal Once    collagenase ointment   Topical Daily    epoetin alfa-epbx (RETACRIT) injection 6,000 Units  6,000 Units SubCUTAneous Once per day on Mon Wed Fri    famotidine (PEPCID) tablet 20 mg  20 mg Per NG tube Daily    amiodarone (CORDARONE) tablet 200 mg  200 mg Per NG tube Daily    therapeutic multivitamin-minerals 1 tablet  1 tablet Per NG tube Daily    fentaNYL (SUBLIMAZE) injection 25 mcg  25 mcg IntraVENous Q1H PRN    heparin (porcine) injection 5,000 Units  5,000 Units SubCUTAneous BID    oxyCODONE (ROXICODONE) immediate release tablet 5 mg  5 mg Oral Q4H PRN    midodrine (PROAMATINE) tablet 5 mg  5 mg Oral Daily PRN    insulin lispro (HUMALOG) injection vial 0-4 Units  0-4 Units SubCUTAneous 4 times per day    acidophilus probiotic capsule 1 capsule  1 capsule Oral Daily    alcohol 62% (NOZIN) nasal sanitizer 1 ampule  1 ampule Topical BID    bisacodyl (DULCOLAX) suppository 10 mg  10 mg  Rectal Daily PRN    albumin human 25% IV solution 25 g  25 g IntraVENous PRN    heparin (porcine) injection 1,200 Units  1,200 Units IntraCATHeter PRN    And    heparin (porcine) injection 1,000 Units  1,000 Units IntraCATHeter PRN    sodium chloride 0.9 % bolus 100 mL  100 mL IntraVENous PRN    balsum peru-castor oil (VENELEX) ointment   Topical BID    glucose chewable tablet 16 g  4 tablet Oral PRN    dextrose bolus 10% 125 mL  125 mL IntraVENous PRN    Or    dextrose bolus 10% 250 mL  250 mL IntraVENous PRN    glucagon (rDNA) injection 1 mg  1 mg SubCUTAneous PRN    dextrose 10 % infusion   IntraVENous Continuous PRN    sodium chloride flush 0.9 % injection 5-40 mL  5-40 mL IntraVENous 2 times per day    sodium chloride flush 0.9 % injection 5-40 mL  5-40 mL IntraVENous PRN    0.9 % sodium chloride infusion   IntraVENous PRN    acetaminophen (TYLENOL) tablet 650 mg  650 mg Oral Q6H PRN    Or    acetaminophen (TYLENOL)  suppository 650 mg  650 mg Rectal Q6H PRN    ondansetron (ZOFRAN) injection 4 mg  4 mg IntraVENous Q6H PRN

## 2022-06-26 NOTE — Plan of Care (Signed)
Problem: Safety - Adult  Goal: Free from fall injury  Outcome: Progressing     Problem: Respiratory - Adult  Goal: Achieves optimal ventilation and oxygenation  Outcome: Progressing     Problem: Pain  Goal: Verbalizes/displays adequate comfort level or baseline comfort level  Outcome: Progressing     Problem: Discharge Planning  Goal: Discharge to home or other facility with appropriate resources  Outcome: Progressing     Problem: Chronic Conditions and Co-morbidities  Goal: Patient's chronic conditions and co-morbidity symptoms are monitored and maintained or improved  Outcome: Progressing     Problem: Neurosensory - Adult  Goal: Achieves maximal functionality and self care  Outcome: Progressing     Problem: Cardiovascular - Adult  Goal: Maintains optimal cardiac output and hemodynamic stability  Outcome: Progressing     Problem: Gastrointestinal - Adult  Goal: Maintains or returns to baseline bowel function  Outcome: Progressing     Problem: SLP Adult - Impaired Swallowing  Goal: By Discharge: Advance to least restrictive diet without signs or symptoms of aspiration for planned discharge setting.  See evaluation for individualized goals.  Description: Speech Pathology Goals  1. Patient will participate in FEES to objectively assess swallow function prior to PO diet initiation. Goal initiated 06/22/22. Goal met 06/22/22.   2. Patient will tolerate therapeutic PO trials of thin liquids (e.g., ice chips, small sips of water) and purees without clinical s/s of aspiration or respiratory decline. Goal initiated 06/22/22.  3. Patient will complete pharyngeal strengthening exercises (e.g., effortful swallow) with min cues for technique. Goal initiated 06/22/22.   4. Patient will complete repeat instrumental swallow study to determine readiness for PO diet when clinically appropriate. Goal initiated 06/22/22. MET 06/26/22.    5. Patient will tolerate least restrictive diet with no overt s/s aspiration within 7 days. Initiated  06/26/22.  06/26/2022 1404 by Tora Kindred, SLP  Outcome: Progressing     Problem: Safety - Medical Restraint  Goal: Remains free of injury from restraints (Restraint for Interference with Medical Device)  Description: INTERVENTIONS:  1. Determine that other, less restrictive measures have been tried or would not be effective before applying the restraint  2. Evaluate the patient's condition at the time of restraint application  3. Inform patient/family regarding the reason for restraint  4. Q2H: Monitor safety, psychosocial status, comfort, nutrition and hydration  Outcome: Progressing

## 2022-06-26 NOTE — Progress Notes (Addendum)
Bedside and Verbalshift change report given to Joylene Grapes RN  (oncoming nurse) by Esperanza Richters, RN (offgoing nurse). Report included the following information Nurse Handoff Report.       07:00 RN assessment postponed d/t ongoing H/D    09:00 H/D is ongoing    11:00 H/D is still in progress    12:00 H/D completed. Assessment performed. Wound care RN at the bedside

## 2022-06-26 NOTE — Wound Image (Signed)
Wound Care follow up for the Wound Vac check.  Chart reviewed. Found wound vac off for some reason. Electrical cord missing.   Pt.'s dressing assessed. A new cassett and bottle of Vashe was hung and the dressing was soaked x 2 and functioning well. The cord was reconnected and now the wound vac is working well and charging up as it functions.   Shift assessments should include the Function of the Wound Vac:  battery check, cord present and charging the Vac, canister need changed?,  is there enough Vashe solution to last through the next instillation? If not, a new one has to be spiked. There are several bottles in his room to last through the weekend.   The Veriflo wound vac instills 75 ml of Vashe solution into the Abdominal wound every 4 hours automatically. It dwells in the wound for 6 minutes and then vacuums out into the canister at 125 mmHg  for the next 4 hours.   Ronnald Collum, RN, BSN, [employer]

## 2022-06-26 NOTE — Progress Notes (Addendum)
Bedside and Verbalshift change report given to Irving Burton, RN (oncoming nurse) by Esperanza Richters, RN (offgoing nurse). Report included the following information Nurse Handoff Report.      07:00 RN assessment postponed d/t ongoing H/D     11:00 H/D is still in progress     12:00 H/D completed. Assessment performed. Wound care RN at the bedside.     1300: White and blue lumen do not flush on triple lumen, PICC team contacted and cathflo ordered per MD     1645: Patient placed on speciality air bed.     1700:  Wound care called to bedside for a non-functioning Wound Vac Veriflo. Alarmed for instillation tubing blocked. Wound vac changed by wound care.     1730: Wound vac canister changed.     1745: Tube feed tubing changed and primed.     1800: Wound care preformed by RN on all wounds      Braden Score 11. All of the following interventions have been implemented to prevent pressure injury:    SKIN ASSESSMENT (S)  Dual skin assessment completed at shift change: Yes  Name of second RN who completed Dual Skin Assessment: Tatiana, RN  Picture of wound uploaded to EMR: Yes  Venelex ordered and given per protocol: Yes  Wound care consulted if wounds present: Yes    SURFACE (S)  Stryker air pump or specialty bed ordered: Yes  Type of bed: air bed  Only white chux used with specialty surfaces (no green chux used): Yes  Waffle cushion used for chair positioning: NA    KEEP MOVING (K)  Mobility status (Bedrest, Chairbound, UWA x 1 assist , 2 assist, Max assist): bed rest  Q2 hour turns documented: Yes  Refusals to turn and education provided documented: NA  Device used to float heels: heel lift   PT/OT consulted: Yes    INCONTINENCE (I)  Incontinence status assessed Q2 hours: Yes  External catheter in use: n/a  Barrier cream in use: zinc    NUTRITION (N)  I/O's documented every 8 hours: Yes  Oral supplements ordered if appropriate: Yes  Nutrition services consulted: Yes    All concerns about new DTI's must be escalated directly to  attending MD, charge nurse, CCL and nurse director.     End of Shift Note    Bedside shift change report given to RN (oncoming nurse) by Reed Breech, RN (offgoing nurse).  Report included the following information SBAR, Kardex, Intake/Output, MAR, and Recent Results    Shift worked:  0700-1900     Shift summary and any significant changes:    See above      Concerns for physician to address: N/a     Zone phone for oncoming shift:       Activity:     Number times ambulated in hallways past shift: 0  Number of times OOB to chair past shift: 0    Cardiac:   Cardiac Monitoring: Yes           Access:  Current line(s): central line and HD access     Genitourinary:   Urinary status: oliguric    Respiratory:      Chronic home O2 use?: NO  Incentive spirometer at bedside: N/A       GI:     Current diet:  ADULT TUBE FEEDING; Nasojejunal; Renal Formula; Continuous; 45; No; 50; Q 4 hours; Protein; Prosource BID  DIET ONE TIME MESSAGE;  ADULT DIET; Full Liquid  Passing flatus:  YES  Tolerating current diet: YES       Pain Management:   Patient states pain is manageable on current regimen: YES    Skin:     Interventions: turn team, specialty bed, float heels, increase time out of bed, foam dressing, limit briefs, internal/external urinary devices, and nutritional support    Patient Safety:  Fall Score:    Interventions: bed/chair alarm, assistive device (walker, cane. etc), gripper socks, pt to call before getting OOB, and stay with me (per policy)       Length of Stay:  Expected LOS: 30  Actual LOS: 25      Nnenna Meador MEAD, RN

## 2022-06-26 NOTE — Progress Notes (Signed)
Pt reports the need to void. Report has been passed down that the pt is anuric. The pt report that he does void, but has not since February. Abd is rounded and sift. Nontender. Order found to bladder scan. Done. See flowsheet. Order continued to straight cath if over or urgency w/ discomfort is reported. The pt said that the feeling is faint. He attempted to go in urinal but was unable. Will pass on to day shift the situation. Unsure of hi HD days, pt may or may not go to HD today. Will continue to monitor.

## 2022-06-26 NOTE — Progress Notes (Signed)
Occupational Therapy     Chart reviewed for updates and attempted to see patient for OT treatment. Patient is presently on HD. Will follow up later today or Monday for OT treatment.     Ioannis Schuh P Shirrell Solinger, OTR/L

## 2022-06-26 NOTE — Wound Image (Signed)
Wound care follow up:  Called to the bedside for a non-functioning Wound Vac Veriflo. Alarmed for instillation tubing blocked.   The dressing had to be changed and a new set up applied to the dressing (new sponge and new cassette).  The dressing was applied in the usual manner - occlusively sealed and then a test run for the dressing with a wound vac lock down and then an instillation, dwell and then NPWT on 125 mmHg without problems. The system is now working.   Staff instructed that if the machine fails over the weekend, break down the dressing and apply a Wet to dry dressing using Vashe soluition and kerlix roll gauze and then cover with ABD pads. Date, time the dressing and document the situation like an SBAR in a progress note.   Plan: Wound Care nurse will return on Monday for the Wound Vac check for this patient and change the dressing at that time.   Ronnald Collum, RN, BSN, [employer]

## 2022-06-26 NOTE — Progress Notes (Addendum)
[facility]  [facility]    Progress Note  Date:06/26/2022       Room:2112/01  Patient Name:Jaime [name]     Date of Birth:[birth year]     Age:76 y.o.    Subjective    Subjective   Pt seen at [facility]. Transferred out of the ICU since last visit. Per nursing, no acute events      Review of Systems  Constitutional: No fever, No chills, No sweats, + weakness, No fatigue  Respiratory: No shortness of breath, No cough, No sputum production, No hemoptysis, No wheezing, No cyanosis.  Cardiovascular: No chest pain, No palpitations, No bradycardia, No tachycardia, + peripheral edema, No syncope.  Gastrointestinal: No nausea, No vomiting, No diarrhea, No constipation, No heartburn, No abdominal pain.  Endocrine: No excessive thirst, No polyuria, No cold intolerance, No heat intolerance, No excessive hunger.  Immunologic: No immunocompromised, No recurrent fevers, No recurrent infections.  Musculoskeletal: No back pain, No neck pain, No joint pain, No muscle pain, No claudication, No decreased range of motion, No trauma.  Integumentary: No rash, No pruritus, No abrasions.  Neurologic: Alert, No abnormal balance, No headache, No confusion, No numbness, No tingling.  Psychiatric: No anxiety, No depression, No mania.      Objective         Vitals Last 24 Hours:  TEMPERATURE:  Temp  Avg: 98 F (36.7 C)  Min: 97.6 F (36.4 C)  Max: 98.5 F (36.9 C)  RESPIRATIONS RANGE: Resp  Avg: 20.3  Min: 14  Max: 28  PULSE OXIMETRY RANGE: SpO2  Avg: 94.3 %  Min: 92 %  Max: 96 %  PULSE RANGE: Pulse  Avg: 92.2  Min: 87  Max: 98  BLOOD PRESSURE RANGE: Systolic (24hrs), Avg:146 , Min:136 , Max:163   ; Diastolic (24hrs), Avg:75, Min:66, Max:94    I/O (24Hr):    Intake/Output Summary (Last 24 hours) at 06/29/2022 0856  Last data filed at 06/28/2022 2059  Gross per 24 hour   Intake 303 ml   Output 20 ml   Net 283 ml     Objective  GEN: Pt in NAD. Heels elevated. No family at [facility]  DERM:Bilateral toes 1-10 discoloration  with gangrenous changes. No proximal lymphatic streaking. No drainage or malodor  VASC: Pedal pulses (DP/PT) diminished to B/L LE. Skin temp is warm to cool from proximal to distal for B/L LE. Neg homans/pratts signs to B/L LE. No varicosities or telangectasias noted to B/L LE.  NEURO: Protective and epicritic sensations grossly intact to B/L LE  MSK: (-) POP, No gross deformities. Good muscle tone and bulk noted to B/L LE.  PSYCH: Sedated      Labs/Imaging/Diagnostics    Labs:  CBC:  Recent Labs     06/28/22  1331 06/29/22  0254   WBC 15.4* 16.7*   RBC 2.68* 2.56*   HGB 8.4* 8.0*   HCT 26.7* 25.7*   MCV 99.6* 100.4*   RDW 19.1* 18.7*   PLT 205 225     CHEMISTRIES:  Recent Labs     06/28/22  1331 06/29/22  0254   NA 135*  --    K 4.7  --    CL 101  --    CO2 27  --    BUN 97*  --    CREATININE 5.60*  --    GLUCOSE 159*  --    PHOS  --  5.7*   MG  --  2.8*  PT/INR:No results for input(s): PROTIME, INR in the last 72 hours.  APTT:No results for input(s): APTT in the last 72 hours.  LIVER PROFILE:  Recent Labs     06/28/22  1331 06/29/22  0254   AST 37 38*   ALT 21 19   BILIDIR 0.8* 0.9*   BILITOT 1.1* 1.1*   ALKPHOS 240* 237*       Imaging Last 24 Hours:  No results found.      Assessment//Plan           Hospital Problems             Last Modified POA    * (Principal) Gastric perforation (HCC) 06/02/2022 Yes    Type 2 diabetes mellitus with hyperglycemia, without long-term current use of insulin (HCC) 06/27/2022 Yes    Intestinal obstruction (HCC) 06/05/2022 Yes    Severe sepsis (HCC) 06/05/2022 Yes    Septic shock (HCC) 06/08/2022 Yes    E coli infection 06/27/2022 Yes    Liver abscess 06/05/2022 Yes    Pneumoperitoneum 06/05/2022 Yes    Acute respiratory failure with hypoxia (HCC) 06/05/2022 Yes    AKI (acute kidney injury) (HCC) 06/05/2022 Yes    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 Yes    Thrombocytopenia (HCC) 06/05/2022 Yes    E coli bacteremia 06/08/2022 Yes    Hepatic abscess 06/08/2022 Yes    Peripheral arterial  disease (HCC) 06/08/2022 Yes    Hyperbilirubinemia 06/08/2022 Yes    Gram negative sepsis (HCC) 06/08/2022 Yes    Septicemia (HCC) 06/09/2022 Yes    Aspiration pneumonia of both lower lobes (HCC) 06/12/2022 Yes    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 No new wound formation noted to the feet. The demarcation process is slow but pt has progressed  - Cont local wound care and offloading while in bed  - We will continue to monitor patient and update surgical plan if needed     WB Status: As tolerated for transfers to B/L LE  Wound Care: Paint with all toes with betadine daily              Dshaun Reppucci A. Issaac Shipper, DPM, CWSP, AACFAS    [facility]  [address] [place], [place] [address]  O: [phone]  F: [phone]     [facility] (Opening Sept 2023)  [address] [place]  [place], [place] [address]  O: [phone]  F: [phone]     [facility]  [address]  [place], [place] [address]  O: [phone]  F: [phone]     * Available via [facility] 24/7      Electronically signed by Rosana Fret, DPM on 06/29/22 at 8:56 AM EDT

## 2022-06-26 NOTE — Progress Notes (Signed)
Hospitalist Progress Note    NAME:   Jaime [name]   DOB: December 06, 1945   MRN: [id number]     Date/Time: 06/26/2022 11:57 AM  Patient PCP: No primary care provider on file.    Estimated discharge date:8/27  Barriers: Feeding improvement vs need of PEG tube, Nephrology/ ID clearance.Case manager working on transfer to [employer]?      For reference :   76 y/m with prolonged hospitalization, was admitted to surgical service on 7/31 for perforated hollow viscus underwent emergent laparotomy/ septic shock d/t peritonitis, was intubated and extubated x 2. Also had AKI needing CVVH, now on HD. Also has bilateral foot gangrene. Was transferred to hospitalist service on 8/21.      Assessment / Plan:  #Septic shock- resolved   D/t perforated viscus  S/p Laparotomy 7/31, with wound vac drainage   Liver abscess s/p IR drainage  -Now off pressors  -General surgery and ID following  -Blood culture 7/31 and 8/4  -Culture grew E coli  -Respiratory culture - 8/13- candida ,Wound culture on 8/17- ng , anerobic culture  - 8/18 - ng   -S/P Cefepime, flagyl, Fluconazole and on ceftriaxone from 8/24 - to be continued for a total of 4 - 6 weeks of antibiotics   -Counts increased , compared to yesterday - will touch base with id regarding broadened coverage   -Wound care following  -Liver abscess -S/p IR drainage on 8/4, culture positive for E coli, Drain exchange on 8/22 , bilirubin and lft improving   -Has fecal management system, remove once collection decreases     #Acute hypoxemic respiratory failure:  Intubated 7/31, extubation 8/7  -Reintubated 8/13, extubated on 8/19  -Currently on 2 liters NC     #Dysphagia:  -Ongoing SLP evaluation- advised npo   -Aspiration on FEES study  -Continue with NG tube feeding     #Acute kidney injury:  -Now on HD  -Continue with HD  -Nephrology following     #Bilateral lower extremity critical limb ischemia  Gangrene both feet  -Podiatry and vascular surgery following  -Waiting for demarcation -  will follow up once demarcation      #Diabetes mellitus:  -Sliding scale and lantus  -Monitor FS  -Diabetes team following     #Sacral wound:  -Wound care following     #Goals of care : palliative involved   -Spouse is working with local [place] [facility] and [facility] [place] on goal of transferring patient to [place] area for next steps in his care, including resources for dialysis and transportation.     Medical Decision Making:   I personally reviewed labs: CBC, BMP, LFT   I personally reviewed imaging: na   I personally reviewed EKG: None  Toxic drug monitoring: Monitor creatinine with abx  Discussed case with: RN, patient     Code Status: Full  DVT Prophylaxis: heparin  GI Prophylaxis:none   Subjective:     Chief Complaint / Reason for Physician Visit  Patient admitted for hollow viscus perforation and being managed with wound care . He also had aki - needing hd . His counts are increasing - will discuss with id regarding further care .      Labs and charts reviewed and patient examined . Patient appeared comfortable and in no distress .       Objective:     VITALS:   Last 24hrs VS reviewed since prior progress note. Most recent are:  Patient Vitals for the past 24  hrs:   BP Temp Temp src Pulse Resp SpO2   06/26/22 1047 121/70 97.9 F (36.6 C) -- (!) 103 22 94 %   06/26/22 1030 132/81 -- -- (!) 106 -- --   06/26/22 1015 120/72 -- -- (!) 103 -- --   06/26/22 1000 (!) 143/73 -- -- (!) 103 -- --   06/26/22 0945 128/81 -- -- (!) 102 -- --   06/26/22 0930 134/74 -- -- (!) 102 -- --   06/26/22 0915 (!) 140/80 -- -- 100 -- --   06/26/22 0900 129/83 -- -- 100 -- --   06/26/22 0845 122/68 -- -- (!) 102 -- --   06/26/22 0830 126/73 -- -- (!) 104 -- --   06/26/22 0815 119/66 -- -- (!) 104 -- --   06/26/22 0800 131/72 -- -- (!) 102 -- --   06/26/22 0747 (!) 150/71 98.5 F (36.9 C) -- 100 21 95 %   06/26/22 0245 (!) 145/81 97.8 F (36.6 C) Oral (!) 103 24 93 %   06/25/22 2245 139/66 100.1 F (37.8 C) Axillary 99 22 95 %   06/25/22  2025 136/74 98 F (36.7 C) Oral (!) 102 20 94 %   06/25/22 1500 134/68 99.2 F (37.3 C) Oral 96 21 94 %         Intake/Output Summary (Last 24 hours) at 06/26/2022 1157  Last data filed at 06/26/2022 1047  Gross per 24 hour   Intake 1465 ml   Output 3450 ml   Net -1985 ml        I had a face to face encounter and independently examined this patient on 06/26/2022, as outlined below:  PHYSICAL EXAM:  General:          Alert, cooperative  EENT:              EOMI. Anicteric sclerae.Bilateral jugular catheters ( rt for hd , left cvc )   Resp:               CTA bilaterally, no wheezing or rales.  No accessory muscle use  CV:                  Regular  rhythm,  No edema  GI:                   Soft, Non distended,Mildly tender.  +Bowel sounds, wound vac in place , drain on rt upper quadrant - purulent material , fecal drainage system   Neurologic:       Alert and oriented X 3, normal speech,   Psych:   Good insight. Not anxious nor agitated  Skin:                No rashes.  No jaundice  Foot :  bilateral feet - gangrene of first second and third toes , not yet full demarcation        Reviewed most current lab test results and cultures  YES  Reviewed most current radiology test results   YES  Review and summation of old records today    NO  Reviewed patient's current orders and MAR    YES  PMH/SH reviewed - no change compared to H&P  ________________________________________________________________________  Care Plan discussed with:    Comments   Patient x    Family      RN x    Care Manager     Consultant  Multidiciplinary team rounds were held today with case manager, nursing, pharmacist and clinical coordinator.  Patient's plan of care was discussed; medications were reviewed and discharge planning was addressed.     ________________________________________________________________________  Total NON critical care TIME:  35  Minutes    Total CRITICAL CARE TIME Spent:   Minutes non procedure based       Comments   >50% of visit spent in counseling and coordination of care     ________________________________________________________________________  Earnstine Regal, MD     Procedures: see electronic medical records for all procedures/Xrays and details which were not copied into this note but were reviewed prior to creation of Plan.      LABS:  I reviewed today's most current labs and imaging studies.  Pertinent labs include:  Recent Labs     06/24/22  0315 06/25/22  1214 06/26/22  0511   WBC 16.4* 17.1* 18.6*   HGB 8.2* 7.9* 8.1*   HCT 26.0* 25.3* 26.5*   PLT 197 205 230     Recent Labs     06/24/22  0315 06/25/22  0314 06/26/22  0511   NA 133* 134* 134*   K 3.9 4.2 4.4   CL 100 101 102   CO2 24 26 26    GLUCOSE 176* 188* 137*   BUN 86* 65* 89*   CREATININE 4.61* 3.69* 4.90*   CALCIUM 8.3* 7.7* 8.2*   MG 2.5*  --  2.7*   PHOS 3.7  --  3.7   LABALBU 1.6*  --  1.6*   BILITOT 1.6*  --  1.6*   AST 41*  --  47*   ALT 25  --  26       Signed: , MD

## 2022-06-27 LAB — CULTURE, FUNGUS, BLOOD: Culture: NO GROWTH

## 2022-06-27 LAB — POCT GLUCOSE
POC Glucose: 143 mg/dL — ABNORMAL HIGH (ref 65–117)
POC Glucose: 154 mg/dL — ABNORMAL HIGH (ref 65–117)
POC Glucose: 184 mg/dL — ABNORMAL HIGH (ref 65–117)
POC Glucose: 159 mg/dL — ABNORMAL HIGH (ref 65–117)
POC Glucose: 149 mg/dL — ABNORMAL HIGH (ref 65–117)

## 2022-06-27 MED FILL — METRONIDAZOLE 500 MG/100ML IV SOLN: 500 MG/100ML | INTRAVENOUS | Qty: 100

## 2022-06-27 MED FILL — AMIODARONE HCL 200 MG PO TABS: 200 MG | ORAL | Qty: 1

## 2022-06-27 MED FILL — HEPARIN SODIUM (PORCINE) 5000 UNIT/ML IJ SOLN: 5000 UNIT/ML | INTRAMUSCULAR | Qty: 1

## 2022-06-27 MED FILL — SANTYL 250 UNIT/GM EX OINT: 250 UNIT/GM | CUTANEOUS | Qty: 30

## 2022-06-27 MED FILL — CEFTRIAXONE SODIUM 2 G IJ SOLR: 2 g | INTRAMUSCULAR | Qty: 2000

## 2022-06-27 MED FILL — RISAQUAD-2 PO CAPS: ORAL | Qty: 1

## 2022-06-27 MED FILL — BPCO EX OINT: CUTANEOUS | Qty: 60

## 2022-06-27 MED FILL — THERA M PLUS PO TABS: ORAL | Qty: 1

## 2022-06-27 MED FILL — LANTUS 100 UNIT/ML SC SOLN: 100 UNIT/ML | SUBCUTANEOUS | Qty: 1

## 2022-06-27 NOTE — Plan of Care (Signed)
Speech LAnguage Pathology TREATMENT    Patient: Jaime [name] (76 y.o. male)  Date: 06/27/2022  Primary Diagnosis: Septicemia (HCC) [A41.9]  Gastric perforation (HCC) [K25.5]  Perforated abdominal viscus [R19.8]  Procedure(s) (LRB):  LAPAROTOMY EXPLORATORY (N/A)  LAPAROSCOPY DIAGNOSTIC (N/A)  ENDOSCOPY OF ILEAL CONDUIT (N/A) 26 Days Post-Op   Precautions: aspiration Fall Risk                  ASSESSMENT :  Based on the objective data described below, the patient presents with fair tolerance of full liquid diet.  He benefited from review of results of FEES with reminder for single sips to increase safety.  After initial discussion, able to utilize throughout session appropriately.  Tolerated ~4oz of thin liquids then with strong cough and visible fatigue so trials stopped. Patient was able to verbalize fatigue with intake and has fair awareness of his deficits. Per nsg, patient has been eating very limited amounts due to complaints of fatigue.  Suspect PO will more be for comfort rather than nutritional support until his endurance improves.      Patient will benefit from skilled intervention to address the above impairments.     PLAN :  Recommendations and Planned Interventions:  Diet: Full liquid  --SINGLE sips.  Stop with fatigue   --meds via [place]   --will follow for diet tolerance and consideration of upgrade as endurance improves      Acute SLP Services: Yes, SLP will continue to follow per plan of care.    Discharge Recommendations: Continue to assess pending progress     SUBJECTIVE:   Patient stated, "tell me your honest opinion, do you think I'll make it?." encouraged patient to discuss his medical course and prognosis with physicians for more thorough answer.  Provided education on improvements in swallow function during admission.     OBJECTIVE:   History reviewed. No pertinent past medical history.  Past Surgical History:   Procedure Laterality Date    BLADDER SURGERY N/A 06/01/2022    ENDOSCOPY OF  ILEAL CONDUIT performed by Guinevere Ferrari, MD at [facility]    CT VISCERAL PERCUTANEOUS DRAIN  06/05/2022    CT VISCERAL PERCUTANEOUS DRAIN 06/05/2022 [facility] RAD CT    LAPAROSCOPY N/A 06/01/2022    LAPAROSCOPY DIAGNOSTIC performed by Guinevere Ferrari, MD at [facility]    LAPAROTOMY N/A 06/01/2022    LAPAROTOMY EXPLORATORY performed by Guinevere Ferrari, MD at [facility]     Prior Level of Function/Home Situation:   Social/Functional History  Lives With: Spouse  Type of Home: House  Home Layout: One level  Home Equipment: None  ADL Assistance: Independent  Ambulation Assistance: Independent  Transfer Assistance: Independent  Active Driver: Yes    Baseline Assessment:                Cognitive and Communication Status:  Neurologic State: Alert  Orientation Level: Oriented to person, Oriented to place, and Oriented to situation  Cognition: Follows commands    Dysphagia:  Oral Assessment:     P.O. Trials:  PO Trials  Assessment Method(s): Observation  Vocal Quality: No Impairment  Consistency Presented: Thin  How Presented: SLP-fed/Presented;Straw  Bolus Acceptance: No impairment  Bolus Formation/Control: No impairment  Propulsion: No impairment  Oral Residue: None  Initiation of Swallow: No impairment  Laryngeal Elevation: Functional  Aspiration Signs/Symptoms: Strong cough (with fatigue)  Effective Modifications: Small sips and bites  Cues for Modifications: Minimal  Motor Speech:         Language Comprehension and Expression:                   Neuro-Linguistics:                      Voice:                                   After treatment:   Patient left in no apparent distress in bed, Call bell left within reach, and Nursing notified    COMMUNICATION/EDUCATION:     The patient's plan of care including recommendations, planned interventions, and recommended diet changes were discussed with: Registered nurse    Patient/family have participated as able in goal setting and plan of care and Patient/family agree to work toward  stated goals and plan of care    Thank you,  Benn Moulder, M.CD. CCC-SLP   Minutes: 10

## 2022-06-27 NOTE — Plan of Care (Signed)
Problem: Safety - Adult  Goal: Free from fall injury  Outcome: Progressing     Problem: Respiratory - Adult  Goal: Achieves optimal ventilation and oxygenation  Outcome: Progressing  Flowsheets (Taken 06/26/2022 2030 by Audria Nine, RN)  Achieves optimal ventilation and oxygenation: Assess for changes in respiratory status     Problem: Pain  Goal: Verbalizes/displays adequate comfort level or baseline comfort level  Outcome: Progressing     Problem: Discharge Planning  Goal: Discharge to home or other facility with appropriate resources  Outcome: Progressing  Flowsheets (Taken 06/26/2022 2030 by Audria Nine, RN)  Discharge to home or other facility with appropriate resources: Identify barriers to discharge with patient and caregiver     Problem: Chronic Conditions and Co-morbidities  Goal: Patient's chronic conditions and co-morbidity symptoms are monitored and maintained or improved  Outcome: Progressing  Flowsheets (Taken 06/26/2022 2030 by Audria Nine, RN)  Care Plan - Patient's Chronic Conditions and Co-Morbidity Symptoms are Monitored and Maintained or Improved: Monitor and assess patient's chronic conditions and comorbid symptoms for stability, deterioration, or improvement     Problem: Neurosensory - Adult  Goal: Achieves maximal functionality and self care  Outcome: Progressing  Flowsheets (Taken 06/26/2022 2030 by Audria Nine, RN)  Achieves maximal functionality and self care: Monitor swallowing and airway patency with patient fatigue and changes in neurological status     Problem: Cardiovascular - Adult  Goal: Maintains optimal cardiac output and hemodynamic stability  Outcome: Progressing  Flowsheets (Taken 06/26/2022 2030 by Audria Nine, RN)  Maintains optimal cardiac output and hemodynamic stability: Monitor blood pressure and heart rate  Goal: Absence of cardiac dysrhythmias or at baseline  Outcome: Progressing  Flowsheets (Taken 06/26/2022 2030 by Audria Nine, RN)  Absence of  cardiac dysrhythmias or at baseline: Assess for signs of decreased cardiac output     Problem: Gastrointestinal - Adult  Goal: Maintains or returns to baseline bowel function  Outcome: Progressing  Flowsheets (Taken 06/26/2022 2030 by Audria Nine, RN)  Maintains or returns to baseline bowel function: Assess bowel function  Goal: Maintains adequate nutritional intake  Outcome: Progressing  Flowsheets (Taken 06/26/2022 2030 by Audria Nine, RN)  Maintains adequate nutritional intake: Monitor percentage of each meal consumed     Problem: Genitourinary - Adult  Goal: Absence of urinary retention  Outcome: Progressing  Flowsheets (Taken 06/26/2022 2030 by Audria Nine, RN)  Absence of urinary retention: Assess patient's ability to void and empty bladder     Problem: Metabolic/Fluid and Electrolytes - Adult  Goal: Electrolytes maintained within normal limits  Outcome: Progressing  Flowsheets (Taken 06/26/2022 2030 by Audria Nine, RN)  Electrolytes maintained within normal limits: Monitor labs and assess patient for signs and symptoms of electrolyte imbalances  Goal: Hemodynamic stability and optimal renal function maintained  Outcome: Progressing  Flowsheets (Taken 06/26/2022 2030 by Audria Nine, RN)  Hemodynamic stability and optimal renal function maintained: Monitor labs and assess for signs and symptoms of volume excess or deficit  Goal: Glucose maintained within prescribed range  Outcome: Progressing  Flowsheets (Taken 06/26/2022 2030 by Audria Nine, RN)  Glucose maintained within prescribed range: Monitor blood glucose as ordered     Problem: Skin/Tissue Integrity - Adult  Goal: Skin integrity remains intact  Outcome: Progressing  Flowsheets (Taken 06/26/2022 2030 by Audria Nine, RN)  Skin Integrity Remains Intact: Monitor for areas of redness and/or skin breakdown  Goal: Incisions, wounds, or drain sites healing without S/S of infection  Outcome: Progressing  Flowsheets (Taken 06/26/2022 2030 by  Audria Nine, RN)  Incisions, Wounds, or Drain Sites Healing Without Sign and Symptoms of Infection: ADMISSION and DAILY: Assess and document risk factors for pressure ulcer development  Goal: Oral mucous membranes remain intact  Outcome: Progressing  Flowsheets (Taken 06/26/2022 2030 by Audria Nine, RN)  Oral Mucous Membranes Remain Intact: Assess oral mucosa and hygiene practices     Problem: Hematologic - Adult  Goal: Maintains hematologic stability  Outcome: Progressing  Flowsheets (Taken 06/26/2022 2030 by Audria Nine, RN)  Maintains hematologic stability: Assess for signs and symptoms of bleeding or hemorrhage     Problem: Musculoskeletal - Adult  Goal: Return mobility to safest level of function  Outcome: Progressing  Flowsheets (Taken 06/26/2022 2030 by Audria Nine, RN)  Return Mobility to Safest Level of Function: Assess patient stability and activity tolerance for standing, transferring and ambulating with or without assistive devices  Goal: Maintain proper alignment of affected body part  Outcome: Progressing  Flowsheets (Taken 06/26/2022 2030 by Audria Nine, RN)  Maintain proper alignment of affected body part: Support and protect limb and body alignment per provider's orders  Goal: Return ADL status to a safe level of function  Outcome: Progressing  Flowsheets (Taken 06/26/2022 2030 by Audria Nine, RN)  Return ADL Status to a Safe Level of Function: Administer medication as ordered     Problem: Skin/Tissue Integrity  Goal: Absence of new skin breakdown  Description: 1.  Monitor for areas of redness and/or skin breakdown  2.  Assess vascular access sites hourly  3.  Every 4-6 hours minimum:  Change oxygen saturation probe site  4.  Every 4-6 hours:  If on nasal continuous positive airway pressure, respiratory therapy assess nares and determine need for appliance change or resting period.  Outcome: Progressing     Problem: ABCDS Injury Assessment  Goal: Absence of physical  injury  Outcome: Progressing     Problem: Safety - Medical Restraint  Goal: Remains free of injury from restraints (Restraint for Interference with Medical Device)  Description: INTERVENTIONS:  1. Determine that other, less restrictive measures have been tried or would not be effective before applying the restraint  2. Evaluate the patient's condition at the time of restraint application  3. Inform patient/family regarding the reason for restraint  4. Q2H: Monitor safety, psychosocial status, comfort, nutrition and hydration  Outcome: Progressing

## 2022-06-27 NOTE — Progress Notes (Signed)
Braden Score 12. All of the following interventions have been implemented to prevent pressure injury:    SKIN ASSESSMENT (S)  Dual skin assessment completed at shift change: Yes  Name of second RN who completed Dual Skin Assessment: Deborah,RN   Picture of wound uploaded to EMR: Yes  Venelex ordered and given per protocol: Yes  Wound care consulted if wounds present: Yes    SURFACE (S)  Stryker air pump or specialty bed ordered: Yes  Type of bed  Only white chux used with specialty surfaces (no green chux used): Yes  Waffle cushion used for chair positioning: NA    KEEP MOVING (K)  Mobility status (Bedrest, Chairbound, UWA x 1 assist , 2 assist, Max assist): Dependent  Q2 hour turns documented: Yes  Refusals to turn and education provided documented: NA  Device used to float heels: Blue cushion heels protectors  PT/OT consulted: Yes    INCONTINENCE (I)  Incontinence status assessed Q2 hours: Yes  External catheter in use: No; Oliguric, FMS in  Barrier cream in use: Venelex, Santyl    NUTRITION (N)  I/O's documented every 8 hours: Yes  Oral supplements ordered if appropriate: NA  Nutrition services consulted: Yes    All concerns about new DTI's must be escalated directly to attending MD, charge nurse, CCL and nurse director.       Bedside and Verbal shift change report given to Deborah,RN (oncoming nurse) by Theresia Majors (offgoing nurse). Report included the following information Nurse Handoff Report, Adult Overview, Intake/Output, MAR, Recent Results, Cardiac Rhythm NSR, Quality Measures, and Neuro Assessment.  Patient is resting calmly in bed at this time.

## 2022-06-27 NOTE — Progress Notes (Signed)
Hospitalist Progress Note    NAME:   Jaime [name]   DOB: June 11, 1946   MRN: [id number]     Date/Time: 06/27/2022 10:55 AM  Patient PCP: No primary care provider on file.    Estimated discharge date:8/27  Barriers: Feeding improvement vs need of PEG tube, Nephrology/ ID clearance.Case manager working on transfer to [employer]?      For reference :   76 y/m with prolonged hospitalization, was admitted to surgical service on 7/31 for perforated hollow viscus underwent emergent laparotomy/ septic shock d/t peritonitis, was intubated and extubated x 2. Also had AKI needing CVVH, now on HD. Also has bilateral foot gangrene. Was transferred to hospitalist service on 8/21.      Assessment / Plan:  #Septic shock- resolved   D/t perforated viscus  S/p Laparotomy 7/31, with wound vac drainage   Liver abscess s/p IR drainage  -Now off pressors  -General surgery and ID following  -Blood culture 7/31 and 8/4  -Culture grew E coli  -Respiratory culture - 8/13- candida ,Wound culture on 8/17- ng , anerobic culture  - 8/18 - ng   -S/P Cefepime, flagyl, Fluconazole and on ceftriaxone from 8/24 - to be continued for a total of 4 - 6 weeks of antibiotics .  -Metronidazole was added by id for elevated blood counts   -Will follow cbc today , patient asymptomatic with no fever   -Wound care following  -Liver abscess -S/p IR drainage on 8/4, culture positive for E coli, Drain exchange on 8/22 , bilirubin and lft improving   -Has fecal management system, remove once collection decreases     #Acute hypoxemic respiratory failure:  Intubated 7/31, extubation 8/7  -Reintubated 8/13, extubated on 8/19  -Currently on 2 liters NC- Wean off o2 as possible      #Dysphagia:  -Ongoing SLP evaluation-patient would be able to take full liquids as tolerated - started   -Aspiration on FEES study  -Continue with NG tube feeding     #Acute kidney injury:  -Now on HD  -Continue with HD  -Nephrology following     #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     Medical Decision Making:   I personally reviewed labs: none   I personally reviewed imaging: na   I personally reviewed EKG: None  Toxic drug monitoring: Monitor creatinine with abx  Discussed case with: RN, patient     Code Status: Full  DVT Prophylaxis: heparin  GI Prophylaxis:none   Subjective:     Chief Complaint / Reason for Physician Visit  Patient admitted for hollow viscus perforation and being managed with wound care . He also had aki - needing hd . Flagyl added by id since yesterday .    Labs and charts reviewed and patient examined . Patient appeared comfortable and in no distress . The central line is non functional - can be removed today .       Objective:  VITALS:   Last 24hrs VS reviewed since prior progress note. Most recent are:  Patient Vitals for the past 24 hrs:   BP Temp Temp src Pulse Resp SpO2   06/27/22 0710 112/61 98.2 F (36.8 C) Oral 92 18 94 %   06/27/22 0200 131/66 98.2 F (36.8 C) Oral 97 15 --   06/26/22 1945 (!) 146/72 99.5 F (37.5 C) Axillary 97 19 --   06/26/22 1505 126/71 98.3 F (36.8 C) Oral 98 22 94 %   06/26/22 1400 124/69 -- -- 100 -- --   06/26/22 1200 122/66 97.9 F (36.6 C) -- 98 19 94 %         Intake/Output Summary (Last 24 hours) at 06/27/2022 1055  Last data filed at 06/27/2022 4782  Gross per 24 hour   Intake 295 ml   Output 485 ml   Net -190 ml        I had a face to face encounter and independently examined this patient on 06/27/2022, as outlined below:  PHYSICAL  EXAM:  General:          Alert, cooperative  EENT:              EOMI. Anicteric sclerae.Bilateral jugular catheters ( rt for hd , left cvc )   Resp:               CTA bilaterally, no wheezing or rales.  No accessory muscle use  CV:                  Regular  rhythm,  No edema  GI:                   Soft, Non distended,Mildly tender.  +Bowel sounds, wound vac in place , drain on rt upper quadrant - purulent material , fecal drainage system   Neurologic:       Alert and oriented X 3, normal speech,   Psych:   Good insight. Not anxious nor agitated  Skin:                No rashes.  No jaundice  Foot :  bilateral feet - gangrene of first second and third toes , not yet full demarcation        Reviewed most current lab test results and cultures  YES  Reviewed most current radiology test results   YES  Review and summation of old records today    NO  Reviewed patient's current orders and MAR    YES  PMH/SH reviewed - no change compared to H&P  ________________________________________________________________________  Care Plan discussed with:    Comments   Patient x    Family      RN x    Care Manager     Consultant                        Multidiciplinary team rounds were held today with case manager, nursing, pharmacist and Higher education careers adviser.  Patient's plan of care was discussed; medications were reviewed and discharge planning was addressed.     ________________________________________________________________________  Total NON critical care TIME:  35  Minutes    Total CRITICAL CARE TIME Spent:   Minutes non procedure based      Comments   >50% of visit spent in counseling and coordination of care     ________________________________________________________________________  Earnstine Regal, MD     Procedures: see electronic medical records for  all procedures/Xrays and details which were not copied into this note but were reviewed prior to creation of Plan.      LABS:  I reviewed today's most current labs and imaging  studies.  Pertinent labs include:  Recent Labs     06/25/22  1214 06/26/22  0511   WBC 17.1* 18.6*   HGB 7.9* 8.1*   HCT 25.3* 26.5*   PLT 205 230     Recent Labs     06/25/22  0314 06/26/22  0511   NA 134* 134*   K 4.2 4.4   CL 101 102   CO2 26 26   GLUCOSE 188* 137*   BUN 65* 89*   CREATININE 3.69* 4.90*   CALCIUM 7.7* 8.2*   MG  --  2.7*   PHOS  --  3.7   LABALBU  --  1.6*   BILITOT  --  1.6*   AST  --  47*   ALT  --  26       Signed: Earnstine Regal, MD

## 2022-06-28 LAB — CBC WITH AUTO DIFFERENTIAL
Absolute Immature Granulocyte: 0.2 10*3/uL — ABNORMAL HIGH (ref 0.00–0.04)
Basophils %: 1 % (ref 0–1)
Basophils Absolute: 0.1 10*3/uL (ref 0.0–0.1)
Eosinophils %: 3 % (ref 0–7)
Eosinophils Absolute: 0.5 10*3/uL — ABNORMAL HIGH (ref 0.0–0.4)
Hematocrit: 26.7 % — ABNORMAL LOW (ref 36.6–50.3)
Hemoglobin: 8.4 g/dL — ABNORMAL LOW (ref 12.1–17.0)
Immature Granulocytes: 1 % — ABNORMAL HIGH (ref 0.0–0.5)
Lymphocytes %: 10 % — ABNORMAL LOW (ref 12–49)
Lymphocytes Absolute: 1.5 10*3/uL (ref 0.8–3.5)
MCH: 31.3 PG (ref 26.0–34.0)
MCHC: 31.5 g/dL (ref 30.0–36.5)
MCV: 99.6 FL — ABNORMAL HIGH (ref 80.0–99.0)
MPV: 11.4 FL (ref 8.9–12.9)
Monocytes %: 11 % (ref 5–13)
Monocytes Absolute: 1.7 10*3/uL — ABNORMAL HIGH (ref 0.0–1.0)
Neutrophils %: 74 % (ref 32–75)
Neutrophils Absolute: 11.6 10*3/uL — ABNORMAL HIGH (ref 1.8–8.0)
Nucleated RBCs: 0 PER 100 WBC
Platelets: 205 10*3/uL (ref 150–400)
RBC: 2.68 M/uL — ABNORMAL LOW (ref 4.10–5.70)
RDW: 19.1 % — ABNORMAL HIGH (ref 11.5–14.5)
WBC: 15.4 10*3/uL — ABNORMAL HIGH (ref 4.1–11.1)
nRBC: 0 10*3/uL (ref 0.00–0.01)

## 2022-06-28 LAB — BASIC METABOLIC PANEL
Anion Gap: 7 mmol/L (ref 5–15)
BUN: 97 MG/DL — ABNORMAL HIGH (ref 6–20)
Bun/Cre Ratio: 17 (ref 12–20)
CO2: 27 mmol/L (ref 21–32)
Calcium: 8.4 MG/DL — ABNORMAL LOW (ref 8.5–10.1)
Chloride: 101 mmol/L (ref 97–108)
Creatinine: 5.6 MG/DL — ABNORMAL HIGH (ref 0.70–1.30)
Est, Glom Filt Rate: 10 mL/min/{1.73_m2} — ABNORMAL LOW (ref >60)
Glucose: 159 mg/dL — ABNORMAL HIGH (ref 65–100)
Potassium: 4.7 mmol/L (ref 3.5–5.1)
Sodium: 135 mmol/L — ABNORMAL LOW (ref 136–145)

## 2022-06-28 LAB — POCT GLUCOSE
POC Glucose: 168 mg/dL — ABNORMAL HIGH (ref 65–117)
POC Glucose: 163 mg/dL — ABNORMAL HIGH (ref 65–117)
POC Glucose: 159 mg/dL — ABNORMAL HIGH (ref 65–117)
POC Glucose: 137 mg/dL — ABNORMAL HIGH (ref 65–117)

## 2022-06-28 LAB — HEPATIC FUNCTION PANEL
ALT: 21 U/L (ref 12–78)
AST: 37 U/L (ref 15–37)
Albumin/Globulin Ratio: 0.3 — ABNORMAL LOW (ref 1.1–2.2)
Albumin: 1.6 g/dL — ABNORMAL LOW (ref 3.5–5.0)
Alk Phosphatase: 240 U/L — ABNORMAL HIGH (ref 45–117)
Bilirubin, Direct: 0.8 MG/DL — ABNORMAL HIGH (ref 0.0–0.2)
Globulin: 5.1 g/dL — ABNORMAL HIGH (ref 2.0–4.0)
Total Bilirubin: 1.1 MG/DL — ABNORMAL HIGH (ref 0.2–1.0)
Total Protein: 6.7 g/dL (ref 6.4–8.2)

## 2022-06-28 MED FILL — LANTUS 100 UNIT/ML SC SOLN: 100 UNIT/ML | SUBCUTANEOUS | Qty: 1

## 2022-06-28 MED FILL — RISAQUAD-2 PO CAPS: ORAL | Qty: 1

## 2022-06-28 MED FILL — METRONIDAZOLE 500 MG/100ML IV SOLN: 500 MG/100ML | INTRAVENOUS | Qty: 100

## 2022-06-28 MED FILL — THERA M PLUS PO TABS: ORAL | Qty: 1

## 2022-06-28 MED FILL — HEPARIN SODIUM (PORCINE) 5000 UNIT/ML IJ SOLN: 5000 UNIT/ML | INTRAMUSCULAR | Qty: 1

## 2022-06-28 MED FILL — AMIODARONE HCL 200 MG PO TABS: 200 MG | ORAL | Qty: 1

## 2022-06-28 MED FILL — CEFTRIAXONE SODIUM 2 G IJ SOLR: 2 g | INTRAMUSCULAR | Qty: 2000

## 2022-06-28 NOTE — Progress Notes (Addendum)
Bedside and Verbal shift change report given to Nadara Mustard, RN  (oncoming nurse) by Theola Sequin (offgoing nurse). Report included the following information Nurse Handoff Report, Index, ED Encounter Summary, Intake/Output, MAR, Recent Results, and Cardiac Rhythm * .      1040: Attempted to flush pts NG tube for administration of AM medications. NG would not flush, MD notified. Medications not given at this time. Tube feed held.     1300: Dobhoff removed by CCU RN, pt tolerated approx. 25% of his meal.     1830: Wound care completed on sacrum and BLE.    End of Shift Note    Bedside shift change report given to Tobi Bastos, Charity fundraiser (oncoming nurse) by Nadara Mustard, RN (offgoing nurse).  Report included the following information SBAR, Kardex, ED Summary, Intake/Output, MAR, Recent Results, and Cardiac Rhythm NSR    Shift worked:  [phone]     Shift summary and any significant changes:    See note above.     Dobhoff removed.    Pt tolerating meals well- required assistance with feeding!    Incontinent bowels.     Wound care on sacrum and BLE provided.      Concerns for physician to address:       Zone phone for oncoming shift:          Activity:     Number times ambulated in hallways past shift: 0  Number of times OOB to chair past shift: 0    Cardiac:   Cardiac Monitoring: Yes           Access:  Current line(s): midline     Genitourinary:   Urinary status: oliguric    Respiratory:      Chronic home O2 use?: NO  Incentive spirometer at bedside: NO       GI:     Current diet:  DIET ONE TIME MESSAGE;  ADULT DIET; Full Liquid  Passing flatus: YES  Tolerating current diet: YES       Pain Management:   Patient states pain is manageable on current regimen: YES    Skin:     Interventions: specialty bed, float heels, PT/OT consult, and internal/external urinary devices    Patient Safety:  Fall Score:    Interventions: bed/chair alarm, assistive device (walker, cane. etc), gripper socks, pt to call before getting OOB, and stay with me (per  policy)       Length of Stay:  Expected LOS: 30  Actual LOS: 27      Nadara Mustard, RN

## 2022-06-28 NOTE — Progress Notes (Signed)
Braden Score 13. All of the following interventions have been implemented to prevent pressure injury:    SKIN ASSESSMENT (S)  Dual skin assessment completed at shift change: Yes  Name of second RN who completed Dual Skin Assessment: Heather, RN  Picture of wound uploaded to EMR: Yes  Venelex ordered and given per protocol: Yes  Wound care consulted if wounds present: Yes    SURFACE (S)  Stryker air pump or specialty bed ordered: Yes  Type of bed: Air bed  Only white chux used with specialty surfaces (no green chux used): Yes  Waffle cushion used for chair positioning: NA    KEEP MOVING (K)  Mobility status (Bedrest, Chairbound, UWA x 1 assist , 2 assist, Max assist): Max assist  Q2 hour turns documented: Yes  Refusals to turn and education provided documented: NA  Device used to float heels: Pillows/heels up  PT/OT consulted: Yes    INCONTINENCE (I)  Incontinence status assessed Q2 hours: Yes  External catheter in use: No  Barrier cream in use: yes    NUTRITION (N)  I/O's documented every 8 hours: Yes  Oral supplements ordered if appropriate: Yes  Nutrition services consulted: Yes    All concerns about new DTI's must be escalated directly to attending MD, charge nurse, CCL and nurse director.

## 2022-06-28 NOTE — Plan of Care (Signed)
Problem: Safety - Adult  Goal: Free from fall injury  Outcome: Progressing     Problem: Pain  Goal: Verbalizes/displays adequate comfort level or baseline comfort level  Outcome: Progressing  Flowsheets  Taken 06/28/2022 1516  Verbalizes/displays adequate comfort level or baseline comfort level: Encourage patient to monitor pain and request assistance  Taken 06/28/2022 1127  Verbalizes/displays adequate comfort level or baseline comfort level: Encourage patient to monitor pain and request assistance  Taken 06/28/2022 0755  Verbalizes/displays adequate comfort level or baseline comfort level: Encourage patient to monitor pain and request assistance

## 2022-06-28 NOTE — Progress Notes (Signed)
Hospitalist Progress Note    NAME:   Jaime [name]   DOB: 1946-10-25   MRN: [id number]     Date/Time: 06/28/2022 11:46 AM  Patient PCP: No primary care provider on file.    Estimated discharge date:8/29  Barriers: Feeding improvement vs need of PEG tube, Nephrology/ ID clearance.Case manager working on transfer to [employer]?      For reference :   76 y/m with prolonged hospitalization, was admitted to surgical service on 7/31 for perforated hollow viscus underwent emergent laparotomy/ septic shock d/t peritonitis, was intubated and extubated x 2. Also had AKI needing CVVH, now on HD. Also has bilateral foot gangrene. Was transferred to hospitalist service on 8/21.      Assessment / Plan:  #Septic shock- resolved   D/t perforated viscus  S/p Laparotomy 7/31, with wound vac drainage   Liver abscess s/p IR drainage  -Now off pressors  -General surgery and ID following  -Blood culture 7/31 and 8/4  -Culture grew E coli  -Respiratory culture - 8/13- candida ,Wound culture on 8/17- ng , anerobic culture  - 8/18 - ng   -S/P Cefepime, flagyl, Fluconazole and on ceftriaxone from 8/24 - to be continued for a total of 4 - 6 weeks of antibiotics .  -Metronidazole was added by id for elevated blood counts on 8/25  -Will follow cbc today , patient asymptomatic with no fever   -Wound care following  -Liver abscess -S/p IR drainage on 8/4, culture positive for E coli, Drain exchange on 8/22 , bilirubin and lft improving   -Has fecal management system, remove once collection decreases  -Will check cbc, bmp and hepatic panel today   -NG TUBE clogged - will get surgery eval      #Acute hypoxemic respiratory failure:  Intubated 7/31, extubation 8/7  -Reintubated 8/13, extubated on 8/19  -Currently on 2 liters NC- Wean off o2 as possible      #Dysphagia:  -Ongoing SLP evaluation-patient would be able to take full liquids as tolerated - started , but patient mentioned he feared of chocking when he ate , discussed with nursing  staff - will hold off on oral feeds now   -Aspiration on FEES study  -Continue with NG tube feeding     #Acute kidney injury:  -Now on HD  -Continue with HD  -Nephrology following     #Bilateral lower extremity critical limb ischemia  Gangrene both feet  -Podiatry and vascular surgery following  -Waiting for demarcation - will follow up once demarcation      #Diabetes mellitus:  -Sliding scale and lantus  -Monitor FS  -Diabetes team following     #Sacral wound:  -Wound care following     #Goals of care : palliative involved   -Spouse is working with local [place] [facility] and [facility] [place] on goal of transferring patient to [place] area for next steps in his care, including resources for dialysis and transportation.     Medical Decision Making:   I personally reviewed labs: none   I personally reviewed imaging: na   I personally reviewed EKG: None  Toxic drug monitoring: Monitor creatinine with abx  Discussed case with: RN, patient     Code Status: Full  DVT Prophylaxis: heparin  GI Prophylaxis:none   Subjective:     Chief Complaint / Reason for Physician Visit  Patient admitted for hollow viscus perforation and being managed with wound care . He also had aki - needing hd . Flagyl added  by id since 8/25     Labs and charts reviewed and patient examined . Patient appeared comfortable and in no distress .          Objective:     VITALS:   Last 24hrs VS reviewed since prior progress note. Most recent are:  Patient Vitals for the past 24 hrs:   BP Temp Temp src Pulse Resp SpO2   06/28/22 1127 136/74 97.8 F (36.6 C) Oral 91 23 --   06/28/22 0911 (!) 146/79 -- -- -- -- --   06/28/22 0910 -- 97.9 F (36.6 C) Oral 96 21 --   06/28/22 0755 -- -- -- 90 15 --   06/28/22 0247 132/61 99.1 F (37.3 C) Axillary 91 17 95 %   06/28/22 0006 131/61 99.8 F (37.7 C) Axillary 92 22 95 %   06/27/22 2041 (!) 140/63 98.3 F (36.8 C) Oral 94 20 96 %   06/27/22 2040 -- 98.3 F (36.8 C) Oral 93 26 95 %   06/27/22 1510 (!) 142/62 98 F (36.7  C) Oral 89 18 95 %         Intake/Output Summary (Last 24 hours) at 06/28/2022 1146  Last data filed at 06/27/2022 1830  Gross per 24 hour   Intake 370 ml   Output 150 ml   Net 220 ml        I had a face to face encounter and independently examined this patient on 06/28/2022, as outlined below:  PHYSICAL EXAM:  General:          Alert, cooperative  EENT:              EOMI. Anicteric sclerae.Bilateral jugular catheters ( rt for hd , left cvc )   Resp:               CTA bilaterally, no wheezing or rales.  No accessory muscle use  CV:                  Regular  rhythm,  No edema, AS murmur   GI:                   Soft, Non distended,Mildly tender.  +Bowel sounds, wound vac in place , drain on rt upper quadrant - purulent material , fecal drainage system   Neurologic:       Alert and oriented X 3, normal speech,   Psych:   Good insight. Not anxious nor agitated  Skin:                No rashes.  No jaundice  Foot :  bilateral feet - gangrene of first second and third toes , not yet full demarcation        Reviewed most current lab test results and cultures  YES  Reviewed most current radiology test results   YES  Review and summation of old records today    NO  Reviewed patient's current orders and MAR    YES  PMH/SH reviewed - no change compared to H&P  ________________________________________________________________________  Care Plan discussed with:    Comments   Patient x    Family      RN x    Care Manager     Consultant                        Multidiciplinary team rounds were held today with case manager, nursing, pharmacist and Higher education careers adviser.  Patient's plan of care was discussed; medications were reviewed and discharge planning was addressed.     ________________________________________________________________________  Total NON critical care TIME:  35  Minutes    Total CRITICAL CARE TIME Spent:   Minutes non procedure based      Comments   >50% of visit spent in counseling and coordination of care      ________________________________________________________________________  Earnstine Regal, MD     Procedures: see electronic medical records for all procedures/Xrays and details which were not copied into this note but were reviewed prior to creation of Plan.      LABS:  I reviewed today's most current labs and imaging studies.  Pertinent labs include:  Recent Labs     06/25/22  1214 06/26/22  0511   WBC 17.1* 18.6*   HGB 7.9* 8.1*   HCT 25.3* 26.5*   PLT 205 230     Recent Labs     06/26/22  0511   NA 134*   K 4.4   CL 102   CO2 26   GLUCOSE 137*   BUN 89*   CREATININE 4.90*   CALCIUM 8.2*   MG 2.7*   PHOS 3.7   LABALBU 1.6*   BILITOT 1.6*   AST 47*   ALT 26       Signed: Earnstine Regal, MD

## 2022-06-28 NOTE — Progress Notes (Signed)
Called to reinsert dobhoff d/t current one clogged. Pt cleared to eat full liquid diet with small sips.  Attempted to declog dobhoff but unsuccessful. Diet tray in room.  Pt amenable to trying to eat but is to weak to do on own.  Dobhoff pulled. Mouth care completed. Pt ate 3/4 pudding cup.  O2 sats maintained well and without signs of aspiration. If pt is starting to eat and tube will hopefully be a short term placement and also needing meds to be given through it, ? NGT placement a better option to avoid becoming clogged.  Updated nurse on pt needing to be fed d/t UE weakness, ask about OT consult if not already being seen and dobhoff vs. NGT vs. Leaving out and crushing meds in applesauce.  To call if dobhoff needs to be replaced.

## 2022-06-29 LAB — HEPATITIS B SURFACE ANTIBODY
Hep B S Ab Interp: NONREACTIVE
Hep B S Ab: <3.1 m[IU]/mL

## 2022-06-29 LAB — HEPATIC FUNCTION PANEL
ALT: 19 U/L (ref 12–78)
AST: 38 U/L — ABNORMAL HIGH (ref 15–37)
Albumin/Globulin Ratio: 0.3 — ABNORMAL LOW (ref 1.1–2.2)
Albumin: 1.5 g/dL — ABNORMAL LOW (ref 3.5–5.0)
Alk Phosphatase: 237 U/L — ABNORMAL HIGH (ref 45–117)
Bilirubin, Direct: 0.9 MG/DL — ABNORMAL HIGH (ref 0.0–0.2)
Globulin: 5.3 g/dL — ABNORMAL HIGH (ref 2.0–4.0)
Total Bilirubin: 1.1 MG/DL — ABNORMAL HIGH (ref 0.2–1.0)
Total Protein: 6.8 g/dL (ref 6.4–8.2)

## 2022-06-29 LAB — CBC
Hematocrit: 25.7 % — ABNORMAL LOW (ref 36.6–50.3)
Hemoglobin: 8 g/dL — ABNORMAL LOW (ref 12.1–17.0)
MCH: 31.3 PG (ref 26.0–34.0)
MCHC: 31.1 g/dL (ref 30.0–36.5)
MCV: 100.4 FL — ABNORMAL HIGH (ref 80.0–99.0)
MPV: 11.5 FL (ref 8.9–12.9)
Nucleated RBCs: 0 PER 100 WBC
Platelets: 225 10*3/uL (ref 150–400)
RBC: 2.56 M/uL — ABNORMAL LOW (ref 4.10–5.70)
RDW: 18.7 % — ABNORMAL HIGH (ref 11.5–14.5)
WBC: 16.7 10*3/uL — ABNORMAL HIGH (ref 4.1–11.1)
nRBC: 0 10*3/uL (ref 0.00–0.01)

## 2022-06-29 LAB — POCT GLUCOSE
POC Glucose: 143 mg/dL — ABNORMAL HIGH (ref 65–117)
POC Glucose: 111 mg/dL (ref 65–117)
POC Glucose: 80 mg/dL (ref 65–117)
POC Glucose: 73 mg/dL (ref 65–117)
POC Glucose: 67 mg/dL (ref 65–117)
POC Glucose: 68 mg/dL (ref 65–117)
POC Glucose: 83 mg/dL (ref 65–117)

## 2022-06-29 LAB — PHOSPHORUS: Phosphorus: 5.7 MG/DL — ABNORMAL HIGH (ref 2.6–4.7)

## 2022-06-29 LAB — MAGNESIUM: Magnesium: 2.8 mg/dL — ABNORMAL HIGH (ref 1.6–2.4)

## 2022-06-29 MED ORDER — HEPARIN SODIUM (PORCINE) 5000 UNIT/ML IJ SOLN
5000 | Freq: Three times a day (TID) | INTRAMUSCULAR | Status: DC
Start: 2022-06-29 — End: 2022-08-03
  Administered 2022-06-29 – 2022-08-03 (×103): 5000 [IU] via SUBCUTANEOUS

## 2022-06-29 MED FILL — CEFTRIAXONE SODIUM 2 G IJ SOLR: 2 g | INTRAMUSCULAR | Qty: 2000

## 2022-06-29 MED FILL — LANTUS 100 UNIT/ML SC SOLN: 100 UNIT/ML | SUBCUTANEOUS | Qty: 1

## 2022-06-29 MED FILL — TRUEPLUS GLUCOSE ON THE GO 4 G PO CHEW: 4 g | ORAL | Qty: 4

## 2022-06-29 MED FILL — HEPARIN SODIUM (PORCINE) 1000 UNIT/ML IJ SOLN: 1000 UNIT/ML | INTRAMUSCULAR | Qty: 10

## 2022-06-29 MED FILL — METRONIDAZOLE 500 MG/100ML IV SOLN: 500 MG/100ML | INTRAVENOUS | Qty: 100

## 2022-06-29 MED FILL — AMIODARONE HCL 200 MG PO TABS: 200 MG | ORAL | Qty: 1

## 2022-06-29 MED FILL — RISAQUAD-2 PO CAPS: ORAL | Qty: 1

## 2022-06-29 MED FILL — THERA M PLUS PO TABS: ORAL | Qty: 1

## 2022-06-29 MED FILL — HEPARIN SODIUM (PORCINE) 5000 UNIT/ML IJ SOLN: 5000 UNIT/ML | INTRAMUSCULAR | Qty: 1

## 2022-06-29 MED FILL — OXYCODONE HCL 5 MG PO TABS: 5 MG | ORAL | Qty: 1

## 2022-06-29 MED FILL — RETACRIT 3000 UNIT/ML IJ SOLN: 3000 UNIT/ML | INTRAMUSCULAR | Qty: 2

## 2022-06-29 MED FILL — TRUEPLUS GLUCOSE ON THE GO 4 G PO CHEW: 4 g | ORAL | Qty: 10

## 2022-06-29 NOTE — Wound Image (Signed)
Wound care consult for the wound vac functioning and reassessment:  Chart reviewed and patient assessed. The wound vac was removed for today and cleansed and a Vashe solution wet to day applied to the wound.  The Wound Vac keeps alarming "blockage" in the veriflo cassett.   Assessment: The wound is granulating well and getting cleaner. Good bleeding tissue present.    Plan: We will give it one more day of the Vashe wet kerlix and then do a regular wound vac tomorrow.         Buttocks and Sacrum wound: thin layer   of eschar present with slough in the rest of it.      Treatment: the wounds were cleansed. The Santyl collagenase ointment was applied to the sacrum / buttocks wound with a Kerlix moistened with Vashe solution and then covered with foam dressing. Keep doing the daily wound care and turn significantly to keep him off of the mid buttocks / sacrum area.   Plan: wound care nurse to do the wound vac dressing change tomorrow.   Ronnald Collum, RN, BSN, CWON    Ronnald Collum, RN, BSN, [employer]

## 2022-06-29 NOTE — Plan of Care (Signed)
Problem: Occupational Therapy - Adult  Goal: By Discharge: Performs self-care activities at highest level of function for planned discharge setting.  See evaluation for individualized goals.  Description: FUNCTIONAL STATUS PRIOR TO ADMISSION:     ADL Assistance: Independent, Ambulation Assistance: Independent, Transfer Assistance: Independent, Active Driver: Yes     HOME SUPPORT: Patient lived with spouse and was independent.    Occupational Therapy Goals:  Initiated 06/10/2022; Re-Evaluation 06/20/2022  Weekly Reassessment 06/29/22 - Continue as below. Goal #6 added.  1.  Patient will perform grooming at bed level with Moderate Assist within 7 day(s).  2.  Patient will perform self-feeding with Moderate Assist within 7 day(s).  3.  Patient will perform upper body dressing with Maximal Assist within 7 day(s).  4.  Patient will complete supine>sit with mod assist x2  within 7 day(s).  5.  Patient will tolerate sitting EOB with fair sitting balance in preparation for ADLs within 7 day(s).  6.  Patient will participate in BUE therapeutic activity / exercise with Minimal Assist within 7 day(s).      Outcome: Progressing   OCCUPATIONAL THERAPY TREATMENT: WEEKLY REASSESSMENT    Patient: Jaime [name] 76 y.o. male)  Date: 06/29/2022  Primary Diagnosis: Septicemia ([place]) [A41.9]  Gastric perforation ([place]) [K25.5]  Perforated abdominal viscus [R19.8]  Procedure(s) (LRB):  LAPAROTOMY EXPLORATORY (N/A)  LAPAROSCOPY DIAGNOSTIC (N/A)  ENDOSCOPY OF ILEAL CONDUIT (N/A) 28 Days Post-Op   Precautions: Fall Risk                Chart, occupational therapy assessment, plan of care, and goals were reviewed.    ASSESSMENT  Patient continues to benefit from skilled OT services and is progressing towards goals. Patient is received resting in bed, agreeable to session despite significant fatigue following dialysis this morning. Patient with excellent participation in session, to include BUE exercises and upper-body ADL engagement. Patient  benefits from built-up handles and proximal UE support for improved participation in brushing his teeth. Although no goals met at this time, all remain appropriate with new goal added to address UE strengthening and ROM. Patient remains appropriate for IPR at discharge.              PLAN  Goals have been updated based on progression since last assessment.  Patient continues to benefit from skilled intervention to address the above impairments.    Recommendations and Planned Interventions:   self care training, therapeutic activities, functional mobility training, balance training, therapeutic exercise, endurance activities, patient education, home safety training, and family training/education    Frequency/Duration: OT Plan of Care: 5 times/week    Recommendation for discharge: (in order for the patient to meet his/her long term goals): Therapy 3 hours/day 5-7 days/week    Other factors to consider for discharge: patient's current support system is unable to meet their requirements for physical assistance, high risk for falls, not safe to be alone, concern for safely navigating or managing the home environment, and extended / complicated medical course    IF patient discharges home will need the following DME: bedside commode, hospital bed, mechanical lift, and w/c; TBD       SUBJECTIVE:   Patient stated "Let's go again" while brushing his teeth. Able to exert self for short bouts with rest breaks in between.    OBJECTIVE DATA SUMMARY:   Cognitive/Behavioral Status:  Orientation  Overall Orientation Status: Within Functional Limits  Orientation Level: Oriented X4  Cognition  Overall Cognitive Status: WFL  Following Commands: Follows one  step commands with increased time;Follows multistep commands with increased time  Attention Span: Attends with cues to redirect  Problem Solving: Assistance required to generate solutions  Insights: Decreased awareness of deficits  Initiation: Requires cues for some  Sequencing:  Requires cues for some      Functional Mobility and Transfers for ADLs:  Bed Mobility:  Bed Mobility Training  Rolling: Maximum assistance;Assist X2       Balance:  Balance  Sitting: Impaired  Sitting - Static: Supported sitting  Sitting - Dynamic: Supported sitting      ADL Intervention:  UE Bathing: Moderate assistance;Maximum assistance;Adaptive equipment;Increased time to complete  UE Bathing Skilled Clinical Factors: Patient requires max assist to clean his face effectively with washcloth - difficulty with grasp of single washcloth due to edematous BUEs. Provided rolled / taped washcloths with dual-purpose of promoting digit AROM and allowing patient to maintain grasp and bring to face if needed with proximal support at elbows. Patient requests to brush his teeth. With built up handle, patient is able to maintain loose but secure grasp of toothbrush with proximal supported needed at the elbow and wrist. Patient is able to sustain each bout of effort approx 1 minute, then requiring rest break.    Toileting: Dependent/Total  Toileting Skilled Clinical Factors: Patient rolls L/R with max assist x2, incontinent of stool. Heavy assist needed to maintain sidelying and total assist needed for hygiene.      Therapeutic Activity / Exercise:  Education on importance of UE elevation and movement for edema management. Patient verbalized understanding.     Retrograde massage - Therapist provided with patient tolerating well. Discussed options for carryover when spouse present.     Unilateral chest press from semi-fowlers position: Patient requires max active assist on RUE x5 reps. Improved performance on L with patient requiring mod active assist x5 reps.     Gross grasp / digit flexion + extension: Patient performs x10 reps each arm with cues for isolating movements to hands. Light stabilization needed at forearm/wrist.     Wrist flexion + extension: Patient performs x10 reps each arm with cues for isolating movement to  wrists. Light stabilization needed at forearm.     Patient requires assist to answer phone within allowed timeframe. With proximal support at elbow, patient demonstrates ability to maintain elbow flexion and internal rotation for approx 10 minutes while sustaining grasp of hospital phone. Following call, patient sets phone on his stomach and uses digits to turn off the phone. Increased time required 2/2 edema, decreased coordination, and global weakness.      Pain Rating:  Does not rate. Tolerates session well with rest breaks.       Pain Intervention(s):   rest and repositioning      Activity Tolerance:   Fair , requires frequent rest breaks, and observed shortness of breath on exertion  Please refer to the flowsheet for vital signs taken during this treatment.    After treatment:   Patient left in no apparent distress in bed, Call bell within reach, Bed/ chair alarm activated, Side rails x3, Heels elevated for pressure relief, and Patient offloaded in partial L side lying for pressure relief    COMMUNICATION/EDUCATION:   The patient's plan of care was discussed with: physical therapist and registered nurse    Patient Education  Education Given To: Patient  Education Provided: Role of Therapy;Plan of Care;Transfer Training;Home Exercise Program;ADL Adaptive Strategies  Education Method: Verbal;Demonstration  Education Outcome: Verbalized understanding;Continued education needed  Thank you for this referral.  Isabella Stalling, OT  Minutes: 2

## 2022-06-29 NOTE — Other (Addendum)
[place]  [facility]  DIABETES MANAGEMENT CONSULT    Consulted by Provider for advanced nursing evaluation and care for inpatient blood glucose management.    Evaluation and Action Plan   This 76 year old Caucasian male was admitted with RUQ pain and underwent laparotomy 06/01/22. Was in septic shock with multi-organ failure. Several episodes of Vtach. Remained on vent support, pressors & CRRT. A new finding of liver abscess was noted on CT; drain placed.    There is concern for LLE viability due to diminished pedal pulses. Ischemia of 3 toes on right & 4 toes on the left.  Per vascular, he was not a candidate for endovascular or surgical revascularization at this time. Moved out of ICU but experienced an acute decline (hypotension, arrhythmia & AMS) 06/14/22 & was returned to ICU. Had to be re-intubated but is now off sedation & pressor support. Infectious disease continues to follow & remains on multiple antibiotics. Off tube feeding yesterday and relying on oral intake. Podiatry involved & awaiting demarcation of foot wounds before proceeding. Renal issues addressed per nephrology. Plans to move back to [facility] area are being coordinated by SW & wife; will need dialysis, wound care & nutrition.    As for BG management in this patient with known Type 2 diabetes, treated with oral agent. Has required insulin to offset steroids during this hospital stay as well as override TF. Now that Tfs are stopped, will likely not require scheduled insulin as Bgs in 70-80s this morning range. Will stop basal insulin & rely on correction insulin.    Management Rationale Action Plan   Medication   FL meals Just beginning to eat   Stop basal insulin    LOW corrective approach     Additional orders               Initial Presentation   Jaime [name] is a 76 y.o. male admitted 05/31/22 from ER after experiencing RUQ pain associated with generalized weakness, nausea & vomiting for several days PTA. AMS+.  Hypotension+. Fever+. Dyspneic+. He is visiting from [place].  Ectopic atrial tachycardia  ER exam:  Cardiovascular:      Rate and Rhythm: Regular rhythm. Tachycardia present.   Pulmonary:      Comments: He is tachypneic, coarse breath sounds on inspiration at bilateral bases  Abdominal:      Palpations: Abdomen is soft.      Comments: Tender to palpation diffusely in the right sided abdomen without guarding or rebound tenderness   LAB: WBC 16.6. Normal H&H. Low platelets.BG 272/AG 17. AKI. Elevated liver enzymes. NT pro-BNP 20,980. Lipase >3000.     CXR:   Right IJ catheter satisfactory position without pneumothorax. ET   tube and NG tube as above   CT Head: Negative  CT ABd:   Extraluminal air bubbles in the right upper quadrant concerning for gastric   perforation. Inflammatory changes are noted about the tail of the pancreas   extending into the left anterior pararenal space, please correlate with any   evidence of acute pancreatitis. Bilateral lower lobe infiltrates.     HX: DM    INITIAL DX: Septicemia (HCC) [A41.9]  Gastric perforation (HCC) [K25.5]  Perforated abdominal viscus [R19.8]     Current Treatment     TX: 06/01/22 LAPAROTOMY EXPLORATORY  LAPAROSCOPY DIAGNOSTIC ENDOSCOPY OF ILEAL CONDUIT  EGD    Hospital Course   Clinical progress has been complicated by multi-organ failure.   06/01/22 Dialysis  06/02/22 In septic shock with multi-organ failure. On vent support, sedation, pressor support and bicarb infusion. On Abx. Two runs of VT this morning requiring defibrillation; cardiology conversation anticipated. CRRT+  06/03/22 Remains on vent support C vent Fi02 50%/Peep 7. Low grade fever; on Abx. Steroids+. Afib/bradycardic. Pressors+. CRRT+.   06/04/22 Remains on vent support and pressors. On Abx. Concern for LLE. CRRT. CT: Enlarged left hepatic abscess contain gas with other areas of probable abscess/phlegmonous infection.  06/05/22 On AC vent support. On Amio, vaso, levo & Fentanyl infusions. OG draining  green fluid. CRRT+.  Vascular: Patient remains at risk for progressive deterioration and limb loss but needs to be weaned from pressor support before intervention is safe.  06/08/22 On Spon vent support; plans to extubate after CT today. Fentanyl pushes. H&H low. CRRT+. Mottled & blue toes bilaterally. Vascular AKI study to be completed  06/09/22 Alert. 02NC. Amio infusion+. Pedal pulses returning (no need to use doppler)  06/10/22 Alert. 02NC. NSR. Off pressors. TF running. Swallow eval today => may begin CL. Undergoing hemodialysis now. Continued cyanosis of bilateral toes.  06/11/22 Alert & conversational today  06/12/22 A&O. Failed swallow; allowed ice chips. Podiatry to see for LE ischemia  06/15/22 Intubated & sedated on pressor support. TF restarted at trickle rate. Dialysis planned per nephrology.  06/16/22 Eyes open. Intubated & sedated on Precedex. ST. Hypotensive on epi infusion. Amio+. TF running @10cc /hr. CVVH+.   06/17/22 Eyes closed. Intubated & sedated on Precedex. Spon vent Fi02 50/Peep 5; trying to wean. NSR. Amio +. TF @goal  rate. CVVH+. Wound care involved  06/18/22 Eyes open. Following conversation. Raising left arm. Intubated on spon vent support & mildly sedated; will try again to wean from vent. TF running at goal rate. May get trached. Blood transfusion+  06/19/22 On sp vent support. Concerning trach. Off pressor. Dobhoff with TF. Anuric. HD today  06/22/22. A&O. Off vent & pressor support. TF infusing without issue via Dobhoff. Speech swallow evaluation today. 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 Case management continues to pursue [facility] placement in [place] where he lives  06/23/22 A&O. Dobhoff for nutrition; CHO advancing today. Status of toe ischemia unchanged  06/24/22 Per nursing, "Patient is very agitated and angery. Patient refused to get any care at this point and wanted to be left alone. CCL tried 06/25/22 guided IV  on patient. Patient is very upset since PICC team already had an unsuccessful attempt. Rn Convinced the patient that he will be left along for an hour or so to give him a break and will check upon patient later again. Patient agreed."  06/25/22 A&O. Nutrition via Dobhoff. Working with PT/OT. Multiple services participating in care  06/26/22 A&O. Nutrition via Dobhoff at same rate. PT/OT  06/29/22 A&O. Beginning to consume liquids per patient & nurse. Dialysis+    Diabetes History   Type 2 diabetes on metformin therapy  Checks Bgs and they are in the 100s  Is physically active    Diabetes-related Medical History deferred    Diabetes Medication History - based on PTA list  Drug class Currently in use   Biguanide [x]  Metformin (Glucophage)  []  Metformin ER (Glucophage XL)     Diabetes self-management practices: deferred  Overall evaluation:    [x]  Unknown A1c     Subjective   "Yes, I think I'm eating well."     Objective   Physical exam  General Obese male  who is in no acute distress  Neuro  Alert & oriented  Vital Signs   Vitals:    06/29/22 0858   BP: (!) 143/77   Pulse: 87   Resp:    Temp:    SpO2:      Extremities    Diabetic foot exam:    Left Foot     Visual Exam: Cool & dry. Thickened toenails. 3 toes gangrenous    Pulse DP: +1   Right Foot   Visual Exam: Cool & dry. Thickened toenails . 4 toes gangrenous   Pulse DP: +1      Laboratory  Recent Labs     06/28/22  1331 06/29/22  0254   WBC 15.4* 16.7*   HGB 8.4* 8.0*   HCT 26.7* 25.7*   MCV 99.6* 100.4*   PLT 205 225       Recent Labs     06/28/22  1331 06/29/22  0254   NA 135*  --    K 4.7  --    CL 101  --    CO2 27  --    PHOS  --  5.7*   BUN 97*  --    CREATININE 5.60*  --        Lab Results   Component Value Date    ALT 19 06/29/2022    AST 38 (H) 06/29/2022    ALKPHOS 237 (H) 06/29/2022    BILITOT 1.1 (H) 06/29/2022     No results found for: TSH, TSHREFLEX, TSHFT4, TSHELE, TSH3GEN, TSHHS   Lab Results   Component Value Date    LABA1C 7.7 (H) 06/02/2022      Factors impacting BG management  Factor Dose Comments   Nutrition:  CL      Inadequate intake at this time   Drug  EPO     PRBC 06/18/22   A1cs inaccurate   Pain Oxycodone PRN    Infection Rocephin Q24 hrs  Flagyl Q8 hrs WBC rising still   Other:   Kidney function  Liver function   AKI  AST elevated   HD       Blood glucose pattern      Significant diabetes-related events  05/31/22 Admission BG 233  06/01/22 Dex 4mg  => BG 100s => HC started  06/02/22 Bgs into 200s  06/03/22 Bgs in 170-180s  06/04/22 Bgs rising abit. HC reduced frequency today. Trickle feeds to begin  06/05/22 Bgs rising into 200s  06/08/22 Bgs in target  06/09/22 Bgs rising with start of TF at higher rate  06/10/22 Bgs rose abit with TF @30cc /hr  06/11/22 Bgs 108, 113. No insulin given  06/12/22 No insulin given. No nutrition. AKI  06/14/22 Acute decline in health status after dialysis => back to ICU  06/15/22 TF restarted  06/16/22 TF still at 10cc/hr  06/17/22 TF at goal rate. Bgs 190s  06/18/22 Tfs running at goal rate. FBG 183  06/19/22 FBG 170 but higher during the day  06/22/22 Bgs 130-170s  06/23/22 Bgs in acceptable range but CHO load increasing  06/24/22 Bgs 150-180s  06/25/22 Bgs in target  06/26/22 Bgs in target  06/29/22 Off TF => Bgs in 70-80s    Assessment and Nursing Intervention   Nursing Diagnosis Risk for unstable blood glucose pattern   Nursing Intervention Domain 5250 Decision-making Support   Nursing Interventions Examined current inpatient diabetes/blood glucose control   Explored factors facilitating and impeding inpatient management  Explored corrective strategies with patient  and responsible inpatient provider   Informed patient of rational for insulin strategy while hospitalized     Billing Code(s)   []  [phone] IP subsequent hospital care - 50 minutes   [x]  99232 IP subsequent hospital care - 35 minutes   []  99231 IP subsequent hospital care - 25 minutes   []  99221 IP initial hospital care - 40 minutes     Before making these care recommendations, I  personally reviewed the hospitalization record, including notes, laboratory & diagnostic data and current medications, and examined the patient at the bedside (circumstances permitting) before determining care. More than fifty (50) percent of the time was spent in patient counseling and/or care coordination.  Total minutes: 35    [id number], APRN - CNS  Clinical Nurse Specialist - Diabetes & endocrine disorders  [facility]  Access via [employer]

## 2022-06-29 NOTE — Progress Notes (Signed)
Speech Pathology Note    SLP visit attempted, however patient working with PT/OT currently. Will defer and will continue to follow. Thank you.    Katy Apo, M.S., CCC-SLP

## 2022-06-29 NOTE — Other (Signed)
Post HD note   06/29/22 1230   Treatment   Time Off 1230   Treatment Goal 2000   Observations & Evaluations   Level of Consciousness 0   Oriented X 3   Heart Rhythm Irregular   Respiratory Quality/Effort Unlabored   O2 Device None (Room air)   Bilateral Breath Sounds Diminished   Skin Condition/Temp Cool;Dry   Edema Generalized;Left upper extremity;Right upper extremity   RUE Edema +2   LUE Edema +2   RLE Edema Trace   LLE Edema Trace   Vital Signs   BP 102/64   Temp 97.8 F (36.6 C)   Pulse 95   Respirations 16   During Hemodialysis Assessment   Ultrafiltration Removed (mL) 2500 ml/min   Post-Hemodialysis Assessment   Post-Treatment Procedures Blood returned;Catheter capped, clamped and heparinized x 2 ports   Art therapist   Rinseback Volume (ml) 200 ml   Blood Volume Processed (Liters) 60.7 L   Dialyzer Clearance Clear   Duration of Treatment (minutes) 180 minutes   Heparin Amount Administered During Treatment (mL) 2200 mL   Hemodialysis Intake (ml) 500 ml   Hemodialysis Output (ml) 2500 ml   NET Removed (ml) 2000   Tolerated Treatment Fair   Patient Response to Treatment Pt. tol hd without incident. all possible blood returned to.     RN completed patient assessment. RN reviewed technicians vital signs and procedure note. Tx completed. Reviewed by RN C.George  Pt. Tol HD without incident.  Report given to primary nurse Wyvonnia Dusky

## 2022-06-29 NOTE — Progress Notes (Addendum)
Braden Score 13. All of the following interventions have been implemented to prevent pressure injury:    SKIN ASSESSMENT (S)  Dual skin assessment completed at shift change: Yes  Name of second RN who completed Dual Skin Assessment: Kemi, RN  Picture of wound uploaded to EMR: Yes  Venelex ordered and given per protocol: Yes  Wound care consulted if wounds present: Yes    SURFACE (S)  Stryker air pump or specialty bed ordered: Yes  Type of bed: Air bed  Only white chux used with specialty surfaces (no green chux used): Yes  Waffle cushion used for chair positioning: Pt on bedrest    KEEP MOVING (K)  Mobility status (Bedrest, Chairbound, UWA x 1 assist , 2 assist, Max assist): max assist  Q2 hour turns documented: Yes  Refusals to turn and education provided documented: NA  Device used to float heels: Heels up  PT/OT consulted: Yes    INCONTINENCE (I)  Incontinence status assessed Q2 hours: Yes  External catheter in use: No  Barrier cream in use: yes    NUTRITION (N)  I/O's documented every 8 hours: Yes  Oral supplements ordered if appropriate: Yes  Nutrition services consulted: Yes    All concerns about new DTI's must be escalated directly to attending MD, charge nurse, CCL and nurse director.

## 2022-06-29 NOTE — Plan of Care (Signed)
Problem: Safety - Adult  Goal: Free from fall injury  Outcome: Progressing     Problem: Respiratory - Adult  Goal: Achieves optimal ventilation and oxygenation  Outcome: Progressing     Problem: Pain  Goal: Verbalizes/displays adequate comfort level or baseline comfort level  Outcome: Progressing  Flowsheets (Taken 06/29/2022 0748)  Verbalizes/displays adequate comfort level or baseline comfort level: Encourage patient to monitor pain and request assistance     Problem: Cardiovascular - Adult  Goal: Absence of cardiac dysrhythmias or at baseline  Outcome: Progressing     Problem: Physical Therapy - Adult  Goal: By Discharge: Performs mobility at highest level of function for planned discharge setting.  See evaluation for individualized goals.  Description: FUNCTIONAL STATUS PRIOR TO ADMISSION: Patient was independent and active without use of DME. Drove himself to [place] from [place] for [place]CAR race. Denies home O2 use.     HOME SUPPORT PRIOR TO ADMISSION: The patient lived with wife however wife has her own medical issues and cannot provide physical assist.    Physical Therapy Goals  Re-Assessment 06/29/22  1.  Patient will move from supine to sit and sit to supine, scoot up and down, and roll side to side in bed with maximal assist of 1 within 7 day(s).   2. Patient will sit EOB x 15 minutes with contact guard assist within 7 days.    3.  Patient will perform sit to stand with maximal assistance x2 within 7 day(s).  4.  Patient will transfer from bed to chair and chair to bed via minimal lift equipment (best mover) within 7 day(s).    Reassessed 06/20/2022   1.  Patient will move from supine to sit and sit to supine, scoot up and down, and roll side to side in bed with maximal assist of 1 within 7 day(s).   2. Patient will sit EOB x 5 minutes with contact guard assist within 7 days.    3.  Patient will perform sit to stand with maximal assistance x2 within 7 day(s).  4.  Patient will transfer from bed to  chair and chair to bed via minimal lift equipment (best mover) within 7 day(s).    Initiated 06/10/2022  1.  Patient will move from supine to sit and sit to supine, scoot up and down, and roll side to side in bed with contact guard assist within 7 day(s).   2. Patient will sit EOB x 10 minutes with supervision/set-up assist within 7 days.    2.  Patient will perform sit to stand with moderate assistance x2 within 7 day(s).  3.  Patient will transfer from bed to chair and chair to bed with moderate assistance x2 using the least restrictive device within 7 day(s).  4.  Patient will ambulate with moderate assistance x2 for 5 feet with the least restrictive device within 7 day(s).   06/29/2022 1516 by Rica Koyanagi, PT  Outcome: Progressing     Problem: Occupational Therapy - Adult  Goal: By Discharge: Performs self-care activities at highest level of function for planned discharge setting.  See evaluation for individualized goals.  Description: FUNCTIONAL STATUS PRIOR TO ADMISSION:     ADL Assistance: Independent, Ambulation Assistance: Independent, Transfer Assistance: Independent, Active Driver: Yes     HOME SUPPORT: Patient lived with spouse and was independent.    Occupational Therapy Goals:  Initiated 06/10/2022; Re-Evaluation 06/20/2022  Weekly Reassessment 06/29/22 - Continue as below. Goal #6 added.  1.  Patient will perform  grooming at bed level with Moderate Assist within 7 day(s).  2.  Patient will perform self-feeding with Moderate Assist within 7 day(s).  3.  Patient will perform upper body dressing with Maximal Assist within 7 day(s).  4.  Patient will complete supine>sit with mod assist x2  within 7 day(s).  5.  Patient will tolerate sitting EOB with fair sitting balance in preparation for ADLs within 7 day(s).  6.  Patient will participate in BUE therapeutic activity / exercise with Minimal Assist within 7 day(s).      06/29/2022 1632 by Arlyce Harman, OT  Outcome: Progressing

## 2022-06-29 NOTE — Progress Notes (Signed)
Infectious Disease Progress    Impression    Sepsis  S/p septic shock.    E. coli bacteremia  Blood cultures 7/31+ for E. coli   2/2 LAC (pan sensitive)   Negative repeat cultures 8/4       Acute abdomen  Pneumoperitoneum  S/p diagnostic laparoscopy, laparotomy  EGD 7/31  Findings of cloudy fluid in right upper quadrant, yellow/white  Inflammatory peel over lesser curve of stomach, inferior lobe of liver  No gross perforation identified  Pancreas and retroperitoneum appeared edematous  Intra-Op fluid culture + for E. Coli (pansensitive)    Hepatic abscess  Gas and fluid containing collection 7.1 x 10.8 x 5.4 cm  In anterior left lobe of liver, patchy areas of hypodensity right lobe  4 x 4.5 x 5.5 cm collection  S/p drainage by IR 8/4.Cultures + for  few E.coli  Repeat CT 8/7 shows reduction in size of hepatic abscess  Plan was for ceftriaxone IV x6 weeks end date 9/15, Flagyl  Until 8/18  Repeat CT 8/13 + for    S/p percutaneous drainage of left hepatic abscess with  Minimal fluid remaining around catheter, suggestion of peripancreatic edema, small bilateral pleural effusions  Repeat CT abdomen pelvis 8/16+ for increased peripancreatic edema suspicious for pancreatitis, small residual fluid collection in left hepatic lobe  Lipase 737      Leukocytosis, persistent  WBC 16.7  S/p abdominal wound dehiscence  Clean, no purulence per wound care  Cultures 8/17, 8/18-NG.    Gangrene, discoloration of toes  Persist        Acute hypoxic respiratory failure  Re intubated 8/13, extubated 8/19  Initially intubated 7/31, s/p extubation 8/7  CXR  8/20 + + for atelectasis  Resp cultures  8/13+ for light yeast  Apparent C.albicans/ dubliensis        AKI  Cr 4.9 on HD    Coagulopathy  Improving    Thrombocytopenia  resolved      Transaminitis  Improving    Hyperbilirubinemia   Improved  Bilirubin 1.1    Diabetes type 2  Hyperglycemia  A1c 7.7    Diarrhea  FMS present    Obesity  BMI 34.76  Plan  Continue ceftriaxone 2 g IV daily  Pt  will need  a total of  4-6 weeks of coverage of  liver abscess planned end date 9/15. (Currently D21/42)  De-escalate to p.o. therapy at 4 weeks ( 9/1) per clinical course  Continue Flagyl IV, monitor wbc  Patient would require repeat imaging prior to  De-escalation of therapy to oral antibiotics  ID service to follow.          Extensive review of chart notes, labs, imaging, cultures done  Additionally review of done:  D/w ICU team, Dr. Salena Saner    Patient seen today.  Awake, talking  D/w RN . Accordion draining, Abdominal wound clean    History reviewed. No pertinent past medical history.    Past Surgical History:   Procedure Laterality Date    BLADDER SURGERY N/A 06/01/2022    ENDOSCOPY OF ILEAL CONDUIT performed by Guinevere Ferrari, MD at [facility] MAIN OR    CT VISCERAL PERCUTANEOUS DRAIN  06/05/2022    CT VISCERAL PERCUTANEOUS DRAIN 06/05/2022 [facility] RAD CT    LAPAROSCOPY N/A 06/01/2022    LAPAROSCOPY DIAGNOSTIC performed by Guinevere Ferrari, MD at [facility]    LAPAROTOMY N/A 06/01/2022    LAPAROTOMY EXPLORATORY performed by Guinevere Ferrari, MD at [facility]  Allergies   Allergen Reactions    Augmentin [Amoxicillin-Pot Clavulanate] Hives     Tolerated ceftriaxone 06/2022    Codeine      Unknown reaction       Social Connections: Not on file       No family status information on file.           Review of Systems - Negative except those mentioned in H&P      PHYSICAL EXAM:  General:          Awake, in no distress  EENT:              EOMI. Anicteric sclerae. MMM  Resp:               CTA bilaterally, no wheezing or rales.  No accessory muscle use  CV:                  Regular  rhythm,  No edema  GI:                   Soft, Non distended, Non tender.  +Bowel sounds  Neurologic:      Alert and oriented X 3, normal speech,   Psych:             Good insight. Not anxious nor agitated  Skin:                No rashes.  No jaundice.  Extremities :  No edema, discoloration of  toes++.     Waymon Amato, MD Jerrel Ivory

## 2022-06-29 NOTE — Progress Notes (Signed)
Chart reviewed. Pt off the floor at dialysis. Will defer now and continue to follow.

## 2022-06-29 NOTE — Progress Notes (Signed)
Hospitalist Progress Note    NAME:   Jaime [name]   DOB: 04/22/1946   MRN: [id number]     Date/Time: 06/29/2022 3:42 PM  Patient PCP: No primary care provider on file.    Estimated discharge date:8/29  Barriers: Feeding improvement vs need of PEG tube, Nephrology/ ID clearance.Case manager working on transfer to [employer] case management patient's wife needs to get the financial document for the transfer.      For reference :   76 y/m with prolonged hospitalization, was admitted to surgical service on 7/31 for perforated hollow viscus underwent emergent laparotomy/ septic shock d/t peritonitis, was intubated and extubated x 2. Also had AKI needing CVVH, now on HD. Also has bilateral foot gangrene. Was transferred to hospitalist service on 8/21.      Assessment / Plan:  #Septic shock- resolved   D/t perforated viscus  S/p Laparotomy 7/31, with wound vac drainage   Liver abscess s/p IR drainage  -Now off pressors  -General surgery and ID following  -Blood culture 7/31 and 8/4  -Culture grew E coli  -Respiratory culture - 8/13- candida ,Wound culture on 8/17- ng , anerobic culture  - 8/18 - ng   -S/P Cefepime, flagyl, Fluconazole and on ceftriaxone from 8/24 - to be continued for a total of 4 - 6 weeks of antibiotics .  -Metronidazole was added by id for elevated blood counts on 8/25  -Wound care following  -Liver abscess -S/p IR drainage on 8/4, culture positive for E coli, Drain exchange on 8/22 , bilirubin and lft improving   -NG tube was removed, patient tolerated a small diet yesterday we will follow-up SLP recommendation for final feeding       #Acute hypoxemic respiratory failure:  Intubated 7/31, extubation 8/7  -Reintubated 8/13, extubated on 8/19  -Currently on 2 liters [place]- Wean off o2 as possible      #Dysphagia:  -Ongoing SLP evaluation-patient would be able to take full liquids as tolerated   -Aspiration on FEES study  -Follow-up SLP evaluation     #Acute kidney injury:  -Now on HD  -Continue  with HD  -Nephrology following-will need a PermCath placement prior to discharge.     #Bilateral lower extremity critical limb ischemia  Gangrene both feet  -Podiatry and vascular surgery following  -Waiting for demarcation - will follow up once demarcation      #Diabetes mellitus:  -Sliding scale and lantus  -Monitor FS  -Diabetes team following     #Sacral wound:  -Wound care following     #Goals of care : palliative involved   -Spouse is working with local [place] [facility] and [facility] on goal of transferring patient to [place] area for next steps in his care, including resources for dialysis and transportation.     Medical Decision Making:   I personally reviewed labs: CBC BMP hepatic panel  I personally reviewed imaging: na   I personally reviewed EKG: None  Toxic drug monitoring: Monitor creatinine with abx  Discussed case with: RN, patient     Code Status: Full  DVT Prophylaxis: heparin  GI Prophylaxis:none     Subjective:     Chief Complaint / Reason for Physician Visit  Patient admitted for hollow viscus perforation and being managed with wound care . He also had aki - needing hd . Flagyl added by id since 8/25     Labs and charts reviewed and patient examined . Patient appeared comfortable and in no distress .  Objective:     VITALS:   Last 24hrs VS reviewed since prior progress note. Most recent are:  Patient Vitals for the past 24 hrs:   BP Temp Temp src Pulse Resp SpO2   06/29/22 1230 102/64 97.8 F (36.6 C) -- 95 16 --   06/29/22 1215 108/67 -- -- 91 -- --   06/29/22 1200 113/65 -- -- 90 -- --   06/29/22 1145 105/63 -- -- 89 -- --   06/29/22 1130 104/62 -- -- 91 -- --   06/29/22 1115 97/66 -- -- 90 -- --   06/29/22 1100 120/73 -- -- 80 -- --   06/29/22 1045 129/76 -- -- 85 -- --   06/29/22 1030 120/66 -- -- 85 -- --   06/29/22 1015 103/68 -- -- 88 -- --   06/29/22 1000 129/74 -- -- 85 -- --   06/29/22 0945 (!) 125/92 -- -- 67 -- --   06/29/22 0930 (!) 146/78 96.8 F (36 C) -- 84 16 --    06/29/22 0858 (!) 143/77 -- -- 87 -- --   06/29/22 0748 (!) 163/66 97.6 F (36.4 C) Oral 87 14 96 %   06/29/22 0318 (!) 152/67 98.4 F (36.9 C) Axillary 87 28 --   06/28/22 2305 (!) 143/75 97.6 F (36.4 C) Axillary 96 18 --   06/28/22 2129 -- -- -- -- 20 --   06/28/22 2059 -- -- -- -- 20 --   06/28/22 2007 (!) 136/94 98.5 F (36.9 C) Axillary 98 20 --         Intake/Output Summary (Last 24 hours) at 06/29/2022 1542  Last data filed at 06/29/2022 1230  Gross per 24 hour   Intake 2700 ml   Output 2510 ml   Net 190 ml        I had a face to face encounter and independently examined this patient on 06/29/2022, as outlined below:  PHYSICAL EXAM:  General:          Alert, cooperative  EENT:              EOMI. Anicteric sclerae.Bilateral jugular catheters ( rt for hd , left cvc )   Resp:               CTA bilaterally, no wheezing or rales.  No accessory muscle use  CV:                  Regular  rhythm,  No edema, AS murmur   GI:                   Soft, Non distended,Mildly tender.  +Bowel sounds, wound vac in place , drain on rt upper quadrant - purulent material , fecal drainage system   Neurologic:       Alert and oriented X 3, normal speech,   Psych:   Good insight. Not anxious nor agitated  Skin:                No rashes.  No jaundice  Foot :  bilateral feet - gangrene of first second and third toes , not yet full demarcation        Reviewed most current lab test results and cultures  YES  Reviewed most current radiology test results   YES  Review and summation of old records today    NO  Reviewed patient's current orders and MAR    YES  PMH/SH reviewed - no change compared to  H&P  ________________________________________________________________________  Care Plan discussed with:    Comments   Patient x    Family      RN x    Care Manager     Consultant                        Multidiciplinary team rounds were held today with case manager, nursing, pharmacist and clinical coordinator.  Patient's plan of care was  discussed; medications were reviewed and discharge planning was addressed.     ________________________________________________________________________  Total NON critical care TIME:  35  Minutes    Total CRITICAL CARE TIME Spent:   Minutes non procedure based      Comments   >50% of visit spent in counseling and coordination of care     ________________________________________________________________________  Earnstine Regal, MD     Procedures: see electronic medical records for all procedures/Xrays and details which were not copied into this note but were reviewed prior to creation of Plan.      LABS:  I reviewed today's most current labs and imaging studies.  Pertinent labs include:  Recent Labs     06/28/22  1331 06/29/22  0254   WBC 15.4* 16.7*   HGB 8.4* 8.0*   HCT 26.7* 25.7*   PLT 205 225     Recent Labs     06/28/22  1331 06/29/22  0254   NA 135*  --    K 4.7  --    CL 101  --    CO2 27  --    GLUCOSE 159*  --    BUN 97*  --    CREATININE 5.60*  --    CALCIUM 8.4*  --    MG  --  2.8*   PHOS  --  5.7*   LABALBU 1.6* 1.5*   BILITOT 1.1* 1.1*   AST 37 38*   ALT 21 19       Signed: Earnstine Regal, MD

## 2022-06-29 NOTE — Progress Notes (Signed)
NAME: Jaime [name]        DOB:  1946/10/19        MRN:  [id number]                     Assessment   :                                               Plan:  AKI  Pancreatitis  Hyponatremia  DM  anemia KRT initiated 7/31; now on MWF schedule; right IJ quinton 8/8; HD today    Needs a perm cath prior to discharge. Needs outpatient HD placement under AKI diagnosis ([place]ives in [place] [facility] [place] [facility], but none so far.    H/H not at goal.  Continue ESA.              Subjective:     Chief Complaint:   No complaints. The patient was seen on dialysis at 9:45 AM .  BP is stable. Catheter is functioning well.      Review of Systems:    Symptom Y/N Comments  Symptom Y/N Comments   Fever/Chills    Chest Pain     Poor Appetite    Edema     Cough    Abdominal Pain     Sputum    Joint Pain     SOB/DOE    Pruritis/Rash     Nausea/vomit    Tolerating PT/OT     Diarrhea    Tolerating Diet     Constipation    Other       Could not obtain due to:      Objective:     VITALS:   Last 24hrs VS reviewed since prior progress note. Most recent are:  Vitals:    06/28/22 2305   BP: (!) 143/75   Pulse: 96   Resp: 18   Temp: 97.6 F (36.4 C)   SpO2:        Intake/Output Summary (Last 24 hours) at 06/29/2022 0555  Last data filed at 06/28/2022 2059  Gross per 24 hour   Intake 303 ml   Output 20 ml   Net 283 ml        Telemetry Reviewed:     PHYSICAL EXAM:  General: NAD  1+ edema      Lab Data Reviewed: (see below)    Medications Reviewed: (see below)    PMH/SH reviewed - no change compared to H&P  ________________________________________________________________________  Care Plan discussed with:  Patient     Family      RN     Care Manager                    Consultant:          Comments   >50% of visit spent in counseling and coordination of care        ________________________________________________________________________  Tanna Savoy, MD     Procedures: see electronic medical records for all procedures/Xrays and details which  were not copied into this note but were reviewed prior to creation of Plan.      LABS:  Recent Labs     06/28/22  1331 06/29/22  0254   WBC 15.4* 16.7*   HGB 8.4* 8.0*  HCT 26.7* 25.7*   PLT 205 225       Recent Labs     06/28/22  1331 06/29/22  0254   NA 135*  --    K 4.7  --    CL 101  --    CO2 27  --    BUN 97*  --    MG  --  2.8*   PHOS  --  5.7*       Recent Labs     06/28/22  1331 06/29/22  0254   GLOB 5.1* 5.3*       No results for input(s): INR, APTT in the last 72 hours.    Invalid input(s): PTP   No results for input(s): TIBC, FERR in the last 72 hours.    Invalid input(s): FE, PSAT   No results found for: FOL, RBCF   No results for input(s): PH, PCO2, PO2 in the last 72 hours.  No results for input(s): CPK, CKMB, TROPONINI in the last 72 hours.  No components found for: [facility]  @labua @    MEDICATIONS:  Current Facility-Administered Medications   Medication Dose Route Frequency    cefTRIAXone (ROCEPHIN) 2,000 mg in sodium chloride 0.9 % 50 mL IVPB (mini-bag)  2,000 mg IntraVENous Q24H    metronidazole (FLAGYL) 500 mg in 0.9% NaCl 100 mL IVPB premix  500 mg IntraVENous q8h    therapeutic multivitamin-minerals 1 tablet  1 tablet Per NG tube Daily    heparin (porcine) injection 5,000 Units  5,000 Units SubCUTAneous BID    amiodarone (CORDARONE) tablet 200 mg  200 mg Per NG tube Daily    acidophilus probiotic capsule 1 capsule  1 capsule Oral Daily    alteplase (CATHFLO) injection 1 mg  1 mg IntraCATHeter Once    insulin glargine (LANTUS) injection vial 18 Units  18 Units SubCUTAneous Nightly    lidocaine 2 % injection 20 mL  20 mL IntraDERmal Once    collagenase ointment   Topical Daily    epoetin alfa-epbx (RETACRIT) injection 6,000 Units  6,000 Units SubCUTAneous Once per day on Mon Wed Fri    fentaNYL (SUBLIMAZE) injection  25 mcg  25 mcg IntraVENous Q1H PRN    oxyCODONE (ROXICODONE) immediate release tablet 5 mg  5 mg Oral Q4H PRN    midodrine (PROAMATINE) tablet 5 mg  5 mg Oral Daily PRN    insulin lispro (HUMALOG) injection vial 0-4 Units  0-4 Units SubCUTAneous 4 times per day    alcohol 62% (NOZIN) nasal sanitizer 1 ampule  1 ampule Topical BID    bisacodyl (DULCOLAX) suppository 10 mg  10 mg Rectal Daily PRN    albumin human 25% IV solution 25 g  25 g IntraVENous PRN    heparin (porcine) injection 1,200 Units  1,200 Units IntraCATHeter PRN    And    heparin (porcine) injection 1,000 Units  1,000 Units IntraCATHeter PRN    sodium chloride 0.9 % bolus 100 mL  100 mL IntraVENous PRN    balsum peru-castor oil (VENELEX) ointment   Topical BID    glucose chewable tablet 16 g  4 tablet Oral PRN    dextrose bolus 10% 125 mL  125 mL IntraVENous PRN    Or    dextrose bolus 10% 250 mL  250 mL IntraVENous PRN    glucagon (rDNA) injection 1 mg  1 mg SubCUTAneous PRN    dextrose 10 % infusion   IntraVENous Continuous PRN    sodium  chloride flush 0.9 % injection 5-40 mL  5-40 mL IntraVENous 2 times per day    sodium chloride flush 0.9 % injection 5-40 mL  5-40 mL IntraVENous PRN    0.9 % sodium chloride infusion   IntraVENous PRN    acetaminophen (TYLENOL) tablet 650 mg  650 mg Oral Q6H PRN    Or    acetaminophen (TYLENOL) suppository 650 mg  650 mg Rectal Q6H PRN    ondansetron (ZOFRAN) injection 4 mg  4 mg IntraVENous Q6H PRN

## 2022-06-29 NOTE — Progress Notes (Signed)
Comprehensive Nutrition Assessment    Type and Reason for Visit:  Reassess    Nutrition Recommendations/Plan:   Continue current diet, advancement per SLP  Nepro shakes TID  Please document % meals and supplements consumed in flowsheet I/O's under intake      Malnutrition Assessment:  Malnutrition Status:  Insufficient data (06/04/22 1319)        Nutrition Assessment:     Chart reviewed remotely. NGT removed, currently on full liquids with re-evaluation pending from SLP. Will add supplements and continue monitoring renal labs for restrictions as needed.     Patient Vitals for the past 120 hrs:   PO Meals Eaten (%)   06/28/22 1917 26 - 50%   06/28/22 1516 26 - 50%   06/28/22 0910 0%   06/27/22 0830 1 - 25%   06/26/22 1208 0%     Wt Readings from Last 5 Encounters:   06/22/22 106.5 kg (234 lb 12.6 oz)   [    Nutrition Related Findings:    Labs: Phos 5.7, Hgb 8.0, Mag 2.8. Meds: probiotic, rocephin, flagyl, MVI. Edema: 2+ BUE, perineal, sacral, trace BLE. BM 8/27. Wound Type: Surgical Incision, Wound Vac       Current Nutrition Intake & Therapies:    Average Meal Intake: 26-50%  Average Supplements Intake: None Ordered  DIET ONE TIME MESSAGE;  ADULT DIET; Full Liquid  ADULT ORAL NUTRITION SUPPLEMENT; Breakfast, Lunch, Dinner; Renal Oral Supplement    Anthropometric Measures:  Height: 172.7 cm (_0 )  Ideal Body Weight (IBW): 154 lbs (70 kg)       Current Body Weight: 234 lb 12.6 oz (106.5 kg), 154.2 % IBW. Weight Source: Not Specified  Current BMI (kg/m2): 35.7                          BMI Categories: Obese Class 2 (BMI 35.0 -39.9) (not reliable due to anasarca)    Estimated Daily Nutrient Needs:  Energy Requirements Based On: Formula  Weight Used for Energy Requirements: Current  Energy (kcal/day): MSJ 2150 (1795 x 1.2)  Weight Used for Protein Requirements: Ideal  Protein (g/day): 119-140g (1.7-2gPro/kg IBW)  Method Used for Fluid Requirements: Other (Comment)  Fluid (ml/day): per MD    Nutrition Diagnosis:    Inadequate protein-energy intake related to impaired respiratory function as evidenced by NPO or clear liquid status due to medical condition    Nutrition Interventions:   Food and/or Nutrient Delivery: Continue Current Diet, Start Oral Nutrition Supplement  Nutrition Education/Counseling: No recommendation at this time  Coordination of Nutrition Care: Continue to monitor while inpatient, Interdisciplinary Rounds       Goals:  Previous Goal Met: Goal(s) Achieved  Goals: Tolerate nutrition support at goal rate, by next RD assessment (with lytes WNL and no signs/sx of intolerance)       Nutrition Monitoring and Evaluation:   Behavioral-Environmental Outcomes: None Identified  Food/Nutrient Intake Outcomes: Enteral Nutrition Intake/Tolerance  Physical Signs/Symptoms Outcomes: Biochemical Data, Nutrition Focused Physical Findings, Skin, Weight, GI Status, Hemodynamic Status, Fluid Status or Edema    Discharge Planning:    Too soon to determine     Kristin Bruins, RD  Contact: [phone]

## 2022-06-29 NOTE — Other (Signed)
Pre hD note   06/29/22 0930   Vital Signs   BP (!) 146/78   Temp 96.8 F (36 C)   Pulse 84   Respirations 16   Pain Assessment   Pain Assessment None - Denies Pain   Pain Level 4   Treatment   Time On 0930   Treatment Goal 2000   Observations & Evaluations   Level of Consciousness 0   Oriented X 3   Heart Rhythm Irregular   Respiratory Quality/Effort Unlabored   O2 Device None (Room air)   Edema Generalized   RUE Edema +2   RLE Edema +1   LLE Edema +1   Technical Checks   Dialysis Machine No. 01   RO Machine Number [id number]   Dialyzer Lot No. [id number]   Tubing Lot Number [id number]   All Connections Secure Yes   NS Bag Yes   Saline Line Double Clamped Yes   Dialyzer Revaclear 300   Prime Volume (mL) 300 mL   ICEBOAT I;C;E;B;O;A;T   RO Machine Log Sheet Completed Yes   Air Foam Detector Tested;Proper Function   Extracorporeal Circuit Tested for Integrity Yes   Machine Conductivity 13.8   Manual Conductivity 14   Machine Ph 7   Manual Ph 7   Bleach Test (Neg) Yes   Bath Temperature 98.2 F (36.8 C)   Treatment Initiation   Treatment  Initiation Universal Precautions maintained;Lines secured to patient;Connections secured;Prime given;Venous Parameters set;Arterial Parameters set;IT consultant engaged;[place] Device;Saline line double clamped;Hemo-Safe Applied;Dialyzer;Revaclear Dialyzer;Kidney;REV-300   Dialysis Bath   K+ (Potassium) 3   Ca+ (Calcium) 2.5   Na+ (Sodium) 140   HCO3 (Bicarb) 35   Bicarbonate Concentrate Lot No. [id number]   Acid Concentrate Lot No. [id number]   Hemodialysis Central Access Right Neck   Placement Date/Time: 06/09/22 1830   Present on Admission/Arrival: No  Inserted by: dr. Rutherford Limerick  Orientation: Right  Access Location: Neck   Continued need for line? Yes   Site Assessment Clean, dry & intact   CVC Lumen Status Capped   Venous Lumen Status Capped   Arterial Lumen Status Capped   Alcohol Cap Used No   Line Care Connections checked and tightened;Chlorhexidine wipes;Line pulled back;Ports  disinfected;Tubing changed   Dressing Type Transparent   Date of Last Dressing Change 06/26/22   Dressing Status Clean, dry & intact   Dressing Change Due 06/30/22     Primary RN SBAR: Nadara Mustard  Patient Education: access care  Hepatitis B Surface Ag   Date/Time Value Ref Range Status   06/01/2022 10:08 PM <0.10 Index Final     Hep B S Ab   Date/Time Value Ref Range Status   06/01/2022 10:08 PM <3.10 mIU/mL Final      RN completed patient assessment. RN reviewed technicians vital signs and procedure note. Tx completed. Reviewed by RN C.Greggory Stallion

## 2022-06-29 NOTE — Behavioral Health Treatment Team (Signed)
Patient was off the floor this AM until about 12:30 PM for dialysis.   Wound vac indicated kink and did not reset. Wound care nurse was at bedside to evaluate patient.   Wound vac to abdominal site was discontinued for today, wound dressing applied.   Sacral wound dressing changed x2 due to stool incontinence.     Hypoglycemia treatment with glucose tabs with effectiveness.    Patient needs assistance with PO intake.   Continue to monitor blood glucose.     Patient repositioned as tolerated q2 hrs.

## 2022-06-29 NOTE — Care Coordination-Inpatient (Signed)
Transition of Care Plan:     RUR: 19% (moderate RUR)  Prior Level of Functioning:  Independent - lives with wife in [place].  Disposition: [facility], [place] - family was previously notified to complete Means Test Forms for financial eligibility vs. Placement in [place] area  If SNF or IPR: Date FOC offered: TBD  Date FOC received: TBD  Accepting facility: TBD  Date authorization started with reference number: TBD  Date authorization received and expires: TBD  Follow up appointments: PCP/Specialist  DME needed: TBD  Transportation at discharge: [facility] anticipated  IM/IMM Medicare/Tricare letter given: 2nd IM needed prior to discharge.  Is patient a Veteran and connected with VA? yes              If yes, was [employer] transfer form completed and VA notified? No.  Caregiver Contact: Buren Havey - Wife - [phone]  Discharge Caregiver contacted prior to discharge? Patient to contact.  Care Conference needed? No.  Barriers to discharge: nephrology/new HD?, podiatry/cards clearance, surgical/ID clearance, wound vac, placement choices           Ronnell Freshwater, BSN, RN     Care Management  [phone]

## 2022-06-29 NOTE — Plan of Care (Signed)
Problem: Physical Therapy - Adult  Goal: By Discharge: Performs mobility at highest level of function for planned discharge setting.  See evaluation for individualized goals.  Description: FUNCTIONAL STATUS PRIOR TO ADMISSION: Patient was independent and active without use of DME. Drove himself to [place] from [place] for NASCAR race. Denies home O2 use.     HOME SUPPORT PRIOR TO ADMISSION: The patient lived with wife however wife has her own medical issues and cannot provide physical assist.    Physical Therapy Goals  Re-Assessment 06/29/22  1.  Patient will move from supine to sit and sit to supine, scoot up and down, and roll side to side in bed with maximal assist of 1 within 7 day(s).   2. Patient will sit EOB x 15 minutes with contact guard assist within 7 days.    3.  Patient will perform sit to stand with maximal assistance x2 within 7 day(s).  4.  Patient will transfer from bed to chair and chair to bed via minimal lift equipment (best mover) within 7 day(s).    Reassessed 06/20/2022   1.  Patient will move from supine to sit and sit to supine, scoot up and down, and roll side to side in bed with maximal assist of 1 within 7 day(s).   2. Patient will sit EOB x 5 minutes with contact guard assist within 7 days.    3.  Patient will perform sit to stand with maximal assistance x2 within 7 day(s).  4.  Patient will transfer from bed to chair and chair to bed via minimal lift equipment (best mover) within 7 day(s).    Initiated 06/10/2022  1.  Patient will move from supine to sit and sit to supine, scoot up and down, and roll side to side in bed with contact guard assist within 7 day(s).   2. Patient will sit EOB x 10 minutes with supervision/set-up assist within 7 days.    2.  Patient will perform sit to stand with moderate assistance x2 within 7 day(s).  3.  Patient will transfer from bed to chair and chair to bed with moderate assistance x2 using the least restrictive device within 7 day(s).  4.   Patient will ambulate with moderate assistance x2 for 5 feet with the least restrictive device within 7 day(s).   Outcome: Progressing   PHYSICAL THERAPY TREATMENT: WEEKLY REASSESSMENT    Patient: Jaime [name] 76 y.o. male)  Date: 06/29/2022  Primary Diagnosis: Septicemia (HCC) [A41.9]  Gastric perforation (HCC) [K25.5]  Perforated abdominal viscus [R19.8]  Procedure(s) (LRB):  LAPAROTOMY EXPLORATORY (N/A)  LAPAROSCOPY DIAGNOSTIC (N/A)  ENDOSCOPY OF ILEAL CONDUIT (N/A) 28 Days Post-Op   Precautions: Fall Risk                    ASSESSMENT :  Patient continues to benefit from skilled PT services and is slowly progressing towards goals. Patient received in bed and agreeable to participate, but just back from HD and very fatigued, requests only bed level activity.  Patient rolled right and left with max assist x 2 for clean up and to adjust pad, then scooted up in bed with total assist.  Good tolerance of ther ex for bilateral LEs,  instructed in HEP and educated on importance of active movement.  VSS on RA.  Left in supine with HOB elevated and call bell in reach.     Patient's progression toward goals since last assessment: Patient is slowly progressing, last week was able  to sit EOB x 10 minutes.  Goals remain appropriate, patient is motivated to participate but profoundly weak.     Functional Outcome Measure:  The patient scored 8/24 on the AMPAC outcome measure which is indicative of Cutoff score ?171,2,3 had higher odds of discharging home with home health or need of SNF/IPR.  Marland Kitchen        PLAN :  Goals have been updated based on progression since last assessment.  Patient continues to benefit from skilled intervention to address the above impairments.    Recommendations and Planned Interventions:   bed mobility training, transfer training, gait training, therapeutic exercises, neuromuscular re-education, patient and family training/education, and therapeutic activities    Frequency/Duration: Patient will be  followed by physical therapy to address goals,   per day/week to address goals.    Recommendation for discharge: (in order for the patient to meet his/her long term goals): Therapy 3 hours/day 5-7 days/week    Other factors to consider for discharge: patient's current support system is unable to meet their requirements for physical assistance, high risk for falls, not safe to be alone, and concern for safely navigating or managing the home environment    IF patient discharges home will need the following DME: continuing to assess with progress     SUBJECTIVE:   Patient stated "I'm really beat after dialysis."    OBJECTIVE DATA SUMMARY:     History reviewed. No pertinent past medical history.  Past Surgical History:   Procedure Laterality Date    BLADDER SURGERY N/A 06/01/2022    ENDOSCOPY OF ILEAL CONDUIT performed by Guinevere Ferrari, MD at [facility]    CT VISCERAL PERCUTANEOUS DRAIN  06/05/2022    CT VISCERAL PERCUTANEOUS DRAIN 06/05/2022 [facility] RAD CT    LAPAROSCOPY N/A 06/01/2022    LAPAROSCOPY DIAGNOSTIC performed by Guinevere Ferrari, MD at [facility]    LAPAROTOMY N/A 06/01/2022    LAPAROTOMY EXPLORATORY performed by Guinevere Ferrari, MD at [facility]       Home Situation:  Social/Functional History  Lives With: Spouse  Type of Home: House  Home Layout: One level  Home Equipment: None  ADL Assistance: Independent  Ambulation Assistance: Independent  Transfer Assistance: Independent  Active Driver: Yes  Critical Behavior:          Hearing:   Hearing  Hearing: Within functional limits    Vision/Perceptual:                  Strength:         Tone & Sensation:           Range Of Motion:          Functional Mobility:  Bed Mobility:     Bed Mobility Training  Rolling: Maximum assistance;Assist X2  Transfers:        Balance:                  Ambulation/Gait Training:  [employer] AM-PAC      Basic Mobility Inpatient Short Form (6-Clicks) Version 2  How much HELP from another person do you currently need... (If the patient hasn't done an activity recently, how much help from another person do you think they would need if they tried?) Total A Lot A Little None   1.  Turning from your back to your side while in a flat bed without using bedrails? []   1 [x]   2 []   3  []   4   2.  Moving from lying on your back to sitting on the side of a flat bed without using bedrails? []   1 [x]   2 []   3  []   4   3.  Moving to and from a bed to a chair (including a wheelchair)? [x]   1 []   2 []   3  []   4   4. Standing up from a chair using your arms (e.g. wheelchair or bedside chair)? [x]   1 []   2 []   3  []   4   5.  Walking in hospital room? [x]   1 []   2 []   3  []   4   6.  Climbing 3-5 steps with a railing? [x]   1 []   2 []   3  []   4     Raw Score: 8/24                            Cutoff score ?171,2,3 had higher odds of discharging home with home health or need of SNF/IPR.    1. , , Vinoth , Passek, . .  Validity of the AM-PAC "6-Clicks" Inpatient Daily Activity and Basic Mobility Short Forms. Physical Therapy Mar 2014, 94 (3) 379-391; DOI: 10.2522/ptj.20130199  2. . Association of AM-PAC "6-Clicks" Basic Mobility and Daily Activity Scores With Discharge Destination. Phys Ther. 2021 Apr 4;101(4):pzab043. doi: 10.1093/ptj/pzab043. PMID: .  3. Herbold J, Rajaraman D, , Agayby K, Superior S. Activity Measure for Post-Acute Care "6-Clicks" Basic Mobility Scores Predict Discharge Destination After Acute Care Hospitalization in Select Patient Groups: A Retrospective, Observational Study. Arch Rehabil Res Clin Transl. 2022 Jul 16;4(3):100204. doi:  10.1016/j.arrct. . PMID: ; PMCID .  4. , Coster W, Ni P. AM-PAC Short Forms Manual 4.0. Revised 12/2018.  Activity Tolerance:   requires frequent rest breaks    After treatment:   Patient left in no apparent distress in bed, Call bell within reach, Bed/ chair alarm activated, and Side rails x3    COMMUNICATION/EDUCATION:   The patient's plan of care was discussed with: occupational therapist and registered nurse    Patient Education  Education Provided: Home Exercise Program  Education Method: Verbal;Demonstration  Barriers to Learning: None  Education Outcome: Verbalized understanding;Demonstrated understanding    Thank you for this referral.  Rica Koyanagi, PT  Minutes: 33

## 2022-06-30 LAB — COMPREHENSIVE METABOLIC PANEL
ALT: 19 U/L (ref 12–78)
AST: 37 U/L (ref 15–37)
Albumin/Globulin Ratio: 0.3 — ABNORMAL LOW (ref 1.1–2.2)
Albumin: 1.7 g/dL — ABNORMAL LOW (ref 3.5–5.0)
Alk Phosphatase: 212 U/L — ABNORMAL HIGH (ref 45–117)
Anion Gap: 8 mmol/L (ref 5–15)
BUN: 69 MG/DL — ABNORMAL HIGH (ref 6–20)
Bun/Cre Ratio: 14 (ref 12–20)
CO2: 27 mmol/L (ref 21–32)
Calcium: 8.2 MG/DL — ABNORMAL LOW (ref 8.5–10.1)
Chloride: 100 mmol/L (ref 97–108)
Creatinine: 4.76 MG/DL — ABNORMAL HIGH (ref 0.70–1.30)
Est, Glom Filt Rate: 12 mL/min/{1.73_m2} — ABNORMAL LOW (ref >60)
Globulin: 5.2 g/dL — ABNORMAL HIGH (ref 2.0–4.0)
Glucose: 149 mg/dL — ABNORMAL HIGH (ref 65–100)
Potassium: 4.5 mmol/L (ref 3.5–5.1)
Sodium: 135 mmol/L — ABNORMAL LOW (ref 136–145)
Total Bilirubin: 1.2 MG/DL — ABNORMAL HIGH (ref 0.2–1.0)
Total Protein: 6.9 g/dL (ref 6.4–8.2)

## 2022-06-30 LAB — POCT GLUCOSE
POC Glucose: 125 mg/dL — ABNORMAL HIGH (ref 65–117)
POC Glucose: 124 mg/dL — ABNORMAL HIGH (ref 65–117)
POC Glucose: 144 mg/dL — ABNORMAL HIGH (ref 65–117)
POC Glucose: 136 mg/dL — ABNORMAL HIGH (ref 65–117)

## 2022-06-30 MED FILL — CEFTRIAXONE SODIUM 2 G IJ SOLR: 2 g | INTRAMUSCULAR | Qty: 2000

## 2022-06-30 MED FILL — METRONIDAZOLE 500 MG/100ML IV SOLN: 500 MG/100ML | INTRAVENOUS | Qty: 100

## 2022-06-30 MED FILL — HEPARIN SODIUM (PORCINE) 5000 UNIT/ML IJ SOLN: 5000 UNIT/ML | INTRAMUSCULAR | Qty: 1

## 2022-06-30 MED FILL — AMIODARONE HCL 200 MG PO TABS: 200 MG | ORAL | Qty: 1

## 2022-06-30 MED FILL — OXYCODONE HCL 5 MG PO TABS: 5 MG | ORAL | Qty: 1

## 2022-06-30 MED FILL — RISAQUAD-2 PO CAPS: ORAL | Qty: 1

## 2022-06-30 MED FILL — THERA M PLUS PO TABS: ORAL | Qty: 1

## 2022-06-30 NOTE — Wound Image (Addendum)
Wound care consult for the mid abdominal surgical wound:  Chart reviewed and patient assessed. Pt. Slept through the entire wound care today.  He did wake up for about a minute to say "hi!".   Assessment: the wound is continuing to fill in with granulation tissue and there is less slough each time we change the dressing.       Treatment: the wound was cleansed with Vashe solution and wiped with gauze to remove the old drainage and wound debris. The edges were picture framed to protect the skin.  A strip of Hydrofera Blue was used today with Vashe to pack the mid wound. This was followed by the black Granufoam sponge and occlusively sealed with the drape.   TRAC pad applied to the bottom half of the wound and the settings on 125 mmHg. The dressing drew down well and the patient tolerated it well without pain meds.    Note: pt. Is sleeping a lot during the day.  He should be more mobile by now. Please encourage mobility and encourage him to eat his protein rich meals.   Ronnald Collum, RN, BSN, CWON    Note: the Wound Vac was switched out for a new rental wound vac on this patient.  The new serial number is [id number]  The old wound vac was taken to [facility] for repair.   Ronnald Collum, RN, BSN, [employer]

## 2022-06-30 NOTE — Plan of Care (Signed)
Care plan reviewed.    Problem: Safety - Adult  Goal: Free from fall injury  06/30/2022 0905 by Benay Spice, RN  Outcome: Progressing       Problem: Respiratory - Adult  Goal: Achieves optimal ventilation and oxygenation  06/30/2022 0905 by Benay Spice, RN  Outcome: Progressing       Problem: Pain  Goal: Verbalizes/displays adequate comfort level or baseline comfort level  06/30/2022 0905 by Benay Spice, RN  Outcome: Progressing

## 2022-06-30 NOTE — Care Coordination-Inpatient (Signed)
CM note:----    Updated medicals sent to [facility] fax# [phone].  VA preference form can be completed once financial means form is completed.  Ella Jubilee, MSW

## 2022-06-30 NOTE — Progress Notes (Signed)
Hospitalist Progress Note    NAME:   Jaime [name]   DOB: 19-Jul-1946   MRN: [id number]     Date/Time: 06/30/2022 11:50 AM  Patient PCP: No primary care provider on file.    Estimated discharge date:8/29  Barriers: will get permcath tomorrow , snf placement , hd set up at rehab       For reference :   76 y/m with prolonged hospitalization, was admitted to surgical service on 7/31 for perforated hollow viscus underwent emergent laparotomy/ septic shock d/t peritonitis, was intubated and extubated x 2. Also had AKI needing CVVH, now on HD. Also has bilateral foot gangrene. Was transferred to hospitalist service on 8/21.      Assessment / Plan:  #Septic shock- resolved   D/t perforated viscus  S/p Laparotomy 7/31, with wound vac drainage   Liver abscess s/p IR drainage  -Now off pressors  -General surgery and ID following  -Blood culture 7/31 and 8/4  -Culture grew E coli  -Respiratory culture - 8/13- candida ,Wound culture on 8/17- ng , anerobic culture  - 8/18 - ng   -S/P Cefepime, flagyl, Fluconazole and on ceftriaxone from 8/24 - to be continued for a total of 4 - 6 weeks of antibiotics .  -Metronidazole was added by id for elevated blood counts on 8/25  -Wound care following  -Liver abscess -S/p IR drainage on 8/4, culture positive for E coli, Drain exchange on 8/22 , bilirubin and lft improving   -NG tube was removed on 8/27 , patient able to tolerated diet   -Discussed with case management regarding discharge disposition - wife needs to set up financial assistance for transfer of patient to [place] . Other option could be to go to an SNF in [place] [facility] ) .         #Acute hypoxemic respiratory failure:  Intubated 7/31, extubation 8/7  -Reintubated 8/13, extubated on 8/19  -Currently on 2 liters NC  -Weaned off o2      #Dysphagia:  -Ongoing SLP evaluation-patient tolerating small diet .   -Aspiration on FEES study  -Follow-up SLP evaluation     #Acute kidney injury:  -Now on HD  -Continue with HD  -Nephrology  following-planned for permcath in am tomorrow .   -Case management informed the need to set up HD at SNF prior to discharge .  -Will follow up cmp .     #Bilateral lower extremity critical limb ischemia  Gangrene both feet  -Podiatry and vascular surgery following  -Waiting for demarcation - will follow up once demarcation      #Diabetes mellitus:  -Sliding scale and lantus  -Monitor FS  -Diabetes team following     #Sacral wound:  -Wound care following       Medical Decision Making:   I personally reviewed labs:na   I personally reviewed imaging: na   I personally reviewed EKG: None  Toxic drug monitoring: Monitor creatinine with abx  Discussed case with: RN, patient     Code Status: Full  DVT Prophylaxis: heparin  GI Prophylaxis:none   Subjective:     Chief Complaint / Reason for Physician Visit  Patient admitted for hollow viscus perforation and being managed with wound care . He also had aki - needing hd . Flagyl added by id since 8/25.     Labs and charts reviewed and patient examined . Patient appeared comfortable and in no distress .         Objective:  VITALS:   Last 24hrs VS reviewed since prior progress note. Most recent are:  Patient Vitals for the past 24 hrs:   BP Temp Temp src Pulse Resp SpO2   06/30/22 0156 (!) 140/66 98.1 F (36.7 C) Axillary 92 18 95 %   06/30/22 0015 -- 97.5 F (36.4 C) Oral 78 16 94 %   06/29/22 1955 (!) 128/105 97.5 F (36.4 C) Oral 86 16 94 %   06/29/22 1555 121/77 97.6 F (36.4 C) Oral 87 16 96 %   06/29/22 1230 102/64 97.8 F (36.6 C) Oral 95 16 --   06/29/22 1215 108/67 -- -- 91 -- --   06/29/22 1200 113/65 -- -- 90 -- --         Intake/Output Summary (Last 24 hours) at 06/30/2022 1150  Last data filed at 06/30/2022 0019  Gross per 24 hour   Intake 3490 ml   Output 2500 ml   Net 990 ml        I had a face to face encounter and independently examined this patient on 06/30/2022, as outlined below:  PHYSICAL EXAM:  General:          Alert, cooperative  EENT:               EOMI. Anicteric sclerae.Rt. hd catn - jugular   Resp:               CTA bilaterally, no wheezing or rales.  No accessory muscle use  CV:                  Regular  rhythm,  No edema, AS murmur   GI:                   Soft, Non distended,Mildly tender.  +Bowel sounds, wound vac in place , drain on rt upper quadrant - purulent material   Neurologic:       Alert and oriented X 3, normal speech,   Psych:   Good insight. Not anxious nor agitated  Skin:                No rashes.  No jaundice  Foot :  bilateral feet - gangrene of first second and third toes , not yet full demarcation           Reviewed most current lab test results and cultures  YES  Reviewed most current radiology test results   YES  Review and summation of old records today    NO  Reviewed patient's current orders and MAR    YES  PMH/SH reviewed - no change compared to H&P  ________________________________________________________________________  Care Plan discussed with:    Comments   Patient x    Family      RN x    Care Manager     Consultant                        Multidiciplinary team rounds were held today with case manager, nursing, pharmacist and Higher education careers adviser.  Patient's plan of care was discussed; medications were reviewed and discharge planning was addressed.     ________________________________________________________________________  Total NON critical care TIME:  55  Minutes    Total CRITICAL CARE TIME Spent:   Minutes non procedure based      Comments   >50% of visit spent in counseling and coordination of care     ________________________________________________________________________  Earnstine Regal, MD  Procedures: see electronic medical records for all procedures/Xrays and details which were not copied into this note but were reviewed prior to creation of Plan.      LABS:  I reviewed today's most current labs and imaging studies.  Pertinent labs include:  Recent Labs     06/28/22  1331 06/29/22  0254   WBC 15.4* 16.7*    HGB 8.4* 8.0*   HCT 26.7* 25.7*   PLT 205 225     Recent Labs     06/28/22  1331 06/29/22  0254   NA 135*  --    K 4.7  --    CL 101  --    CO2 27  --    GLUCOSE 159*  --    BUN 97*  --    CREATININE 5.60*  --    CALCIUM 8.4*  --    MG  --  2.8*   PHOS  --  5.7*   LABALBU 1.6* 1.5*   BILITOT 1.1* 1.1*   AST 37 38*   ALT 21 19       Signed: Earnstine Regal, MD

## 2022-06-30 NOTE — Care Coordination-Inpatient (Addendum)
36 - CM contacted patient's spouse who said she received the Means Test forms but had not yet filled them out and will work on it now.  CM provided her with CM fax number if she is able to fax the completed forms.  CM offered for her to email the paperwork as well if this was possible.  She will see if her doctor's office can fax the documents for her.  She stated that she does not have money to go to a store like [employer] or [employer] to pay for their fax services.  She is aware that these forms can assist with transportation to the [facility] in [place].    1459 - CM spoke with patient who provided verbal signature/agreement to Means Test form with two CMs.  He wanted CM to speak with his wife regarding SNF and is agreeable to CM sending referrals to SNF if his wife is agreeable.  CM attempted to contact the [facility] at [phone] (April - unable to reach anyone, VM left).  CM contacted extension [phone] and asked to speak with someone regarding a patient needing to transport.  CM was transferred to another line.  CM spoke with [facility] and let her know that the Means Test form was completed but patient was unable to sign.  CM notified Sunny Schlein that patient verbalized understanding to both CMs regarding Means Test forms and gave CMs permission to provide a verbalized signature witnesses by both CMs.  Stephan Minister confirmed it was okay to send the form with two witnesses of a verbal signature.  She confirmed that this form needed to be faxed to the [place] at [phone] and April will help coordinate transfer.    1529 - CM faxed Means Test forms to the [place] in [place], [place] and [place], [place].    1627 - CM spoke with patient's wife who is aware Means Test was verbally signed and faxed to the [place]. She is agreeable to CM sending a referral to [facility] SNF since that is one of the only SNF centers in the area with HD services.  Referral sent via Link.      Ronnell Freshwater, BSN, RN    Care Management  [phone]

## 2022-06-30 NOTE — Plan of Care (Signed)
Speech LAnguage Pathology TREATMENT    Patient: Jaime [name] (76 y.o. male)  Date: 06/30/2022  Primary Diagnosis: Septicemia ([place]) [A41.9]  Gastric perforation ([place]) [K25.5]  Perforated abdominal viscus [R19.8]  Procedure(s) (LRB):  LAPAROTOMY EXPLORATORY (N/A)  LAPAROSCOPY DIAGNOSTIC (N/A)  ENDOSCOPY OF ILEAL CONDUIT (N/A) 29 Days Post-Op   Precautions: Fall Risk                  ASSESSMENT :  Therapy targeted oropharyngeal dysphagia on this date. Dobhoff removed since previous SLP visit. Patient observed with strong cough prior to PO intake on this date. Patient observed to independently take single sips of thin liquid on this date. Delayed cough observed though not consistently. Note per discussion with SLP present during FEES on 8/25 patient observed to cough in the absence of aspiration. Patient demonstrated functional oral phase for puree and solid trials. No overt difficulty or signs of aspiration observed following these consistencies.    Will advance to Soft&Bite-Sized/Thin Liquid diet at this time with strict aspiration precautions outlined below. Suspect patient may benefit from ENT referral at discharge given concern for vocal fold redness visualized on FEES; suspect related to presence of ETT vs constant cough.    Patient will benefit from skilled intervention to address the above impairments.     PLAN :  Recommendations and Planned Interventions:  Diet: Soft and bite sized and thin liquids  -- Consider medication whole in puree  -- Strict oral care 2-3x/day  -- 1:1 assistance with PO intake  -- Note patient coughs in the absence of aspiration per FEES on 8/25  -- Aspiration precautions: upright for all PO, single sips of thin liquids (1 at a time), small bites/sips, slow rate of intake, taking breaks as needed throughout meals       Acute SLP Services: Yes, SLP will continue to follow per plan of care.    Discharge Recommendations: Yes, recommend SLP treatment at next level of care  May benefit from  ENT referral at discharge given concern for vocal fold redness visualized on FEES     SUBJECTIVE:   Patient stated, "it's been a pleasure talking to you."    OBJECTIVE:   History reviewed. No pertinent past medical history.  Past Surgical History:   Procedure Laterality Date    BLADDER SURGERY N/A 06/01/2022    ENDOSCOPY OF ILEAL CONDUIT performed by Melene Plan, MD at [facility]    CT VISCERAL PERCUTANEOUS DRAIN  06/05/2022    CT VISCERAL PERCUTANEOUS DRAIN 06/05/2022 [facility] RAD CT    LAPAROSCOPY N/A 06/01/2022    LAPAROSCOPY DIAGNOSTIC performed by Melene Plan, MD at [facility]    LAPAROTOMY N/A 06/01/2022    LAPAROTOMY EXPLORATORY performed by Melene Plan, MD at [facility]     Prior Level of Function/Home Situation:   Social/Functional History  Lives With: Spouse  Type of Home: House  Home Layout: One level  Home Equipment: None  ADL Assistance: Independent  Ambulation Assistance: Independent  Transfer Assistance: Independent  Active Driver: Yes    Cognitive and Communication Status:  Neurologic State: Alert  Orientation Level: Oriented to person, Oriented to place, and Oriented to situation  Cognition: Follows commands    Dysphagia:  P.O. Trials:  PO Trials  Assessment Method(s): Observation  Patient Position: Upright in bed  Vocal Quality: No Impairment  Consistency Presented: Thin;Pureed;Regular  How Presented: SLP-fed/Presented;Straw;Spoon  Bolus Acceptance: No impairment  Bolus Formation/Control: No impairment  Propulsion: No impairment  Oral Residue:  None  Aspiration Signs/Symptoms: Strong cough (Note patient coughs in the absence of aspiration, per FEES on 8/25)  Pharyngeal Phase Characteristics: No impairment, issues, or problems  Effective Modifications: Small sips and bites            Respiratory Status/Airway:  Room air                         After treatment:   Patient left in no apparent distress in bed, Call bell left within reach, and Nursing notified    COMMUNICATION/EDUCATION:   The patient's plan of  care including recommendations, planned interventions, and recommended diet changes were discussed with: Registered nurse and Patient.    Thank you,  Lucius Conn, SLP  Minutes: 15     Problem: SLP Adult - Impaired Swallowing  Goal: By Discharge: Advance to least restrictive diet without signs or symptoms of aspiration for planned discharge setting.  See evaluation for individualized goals.  Description: Speech Pathology Goals  1. Patient will participate in FEES to objectively assess swallow function prior to PO diet initiation. Goal initiated 06/22/22. Goal met 06/22/22.   2. Patient will tolerate therapeutic PO trials of thin liquids (e.g., ice chips, small sips of water) and purees without clinical s/s of aspiration or respiratory decline. Goal initiated 06/22/22. MET 06/30/2022  3. Patient will complete pharyngeal strengthening exercises (e.g., effortful swallow) with min cues for technique. Goal initiated 06/22/22.   4. Patient will complete repeat instrumental swallow study to determine readiness for PO diet when clinically appropriate. Goal initiated 06/22/22. MET 06/26/22.    5. Patient will tolerate least restrictive diet with no overt s/s aspiration within 7 days. Initiated 06/26/22. Continued 06/30/2022  06/30/2022 1529 by Lucius Conn, SLP  Outcome: Progressing

## 2022-06-30 NOTE — Plan of Care (Signed)
Problem: Safety - Adult  Goal: Free from fall injury  Outcome: Progressing     Problem: Respiratory - Adult  Goal: Achieves optimal ventilation and oxygenation  Outcome: Progressing     Problem: Pain  Goal: Verbalizes/displays adequate comfort level or baseline comfort level  Outcome: Progressing

## 2022-06-30 NOTE — Plan of Care (Signed)
Problem: Occupational Therapy - Adult  Goal: By Discharge: Performs self-care activities at highest level of function for planned discharge setting.  See evaluation for individualized goals.  Description: FUNCTIONAL STATUS PRIOR TO ADMISSION:     ADL Assistance: Independent, Ambulation Assistance: Independent, Transfer Assistance: Independent, Active Driver: Yes     HOME SUPPORT: Patient lived with spouse and was independent.    Occupational Therapy Goals:  Initiated 06/10/2022; Re-Evaluation 06/20/2022  Weekly Reassessment 06/29/22 - Continue as below. Goal #6 added.  1.  Patient will perform grooming at bed level with Moderate Assist within 7 day(s).  2.  Patient will perform self-feeding with Moderate Assist within 7 day(s).  3.  Patient will perform upper body dressing with Maximal Assist within 7 day(s).  4.  Patient will complete supine>sit with mod assist x2  within 7 day(s).  5.  Patient will tolerate sitting EOB with fair sitting balance in preparation for ADLs within 7 day(s).  6.  Patient will participate in BUE therapeutic activity / exercise with Minimal Assist within 7 day(s).  Outcome: Progressing  OCCUPATIONAL THERAPY TREATMENT  Patient: Jaime [name] (76 y.o. male)  Date: 06/30/2022  Primary Diagnosis: Septicemia (HCC) [A41.9]  Gastric perforation (HCC) [K25.5]  Perforated abdominal viscus [R19.8]  Procedure(s) (LRB):  LAPAROTOMY EXPLORATORY (N/A)  LAPAROSCOPY DIAGNOSTIC (N/A)  ENDOSCOPY OF ILEAL CONDUIT (N/A) 29 Days Post-Op   Precautions: Fall Risk                Chart, occupational therapy assessment, plan of care, and goals were reviewed.    ASSESSMENT  Patient continues to benefit from skilled OT services and is progressing towards goals. Pt received supine in bed, agreeable to participate. He transferred supine>sit with max A x2. Pt demo'd improved tolerance for activity EOB as well as sitting balance, sitting with primarily CGA (occasional min A) for approx 10 minutes. He participated in LE  AROM, functional reach and weight shifting exercises to facilitate improved ability to engage in seated ADLs and transfers. With proximal support and A for balance (overall mod A) pt able to perform simple face washing ADL while seated EOB. He was returned to bed with max A X2 at end of session, VSS and RN present. Pt is well below his independent baseline and continues to benefit from skilled intervention to address the above impairments. He is motivated to work with therapy to improve his functional performance and offers good participation during session. Continue to recommend IPR at discharge.         PLAN :  Patient continues to benefit from skilled intervention to address the above impairments.  Continue treatment per established plan of care to address goals.    Recommendation for discharge: (in order for the patient to meet his/her long term goals): Therapy 3 hours/day 5-7 days/week    Other factors to consider for discharge: patient's current support system is unable to meet their requirements for physical assistance, high risk for falls, not safe to be alone, and concern for safely navigating or managing the home environment    IF patient discharges home will need the following DME: continuing to assess with progress       SUBJECTIVE:   Patient stated "Did I do ok?."    OBJECTIVE DATA SUMMARY:   Cognitive/Behavioral Status:   Alert  Functional Mobility and Transfers for ADLs:  Bed Mobility:  Bed Mobility Training  Rolling: Maximum assistance;Assist X2  Supine to Sit: Maximum assistance;Assist X2  Sit to Supine: Maximum assistance;Assist X2  Balance:     Balance  Sitting: Impaired  Sitting - Static: Fair (occasional)  Sitting - Dynamic: Fair (occasional)      ADL Intervention:        Grooming: Moderate assistance   Grooming Skilled Clinical Factors: proximal support and min A for balance to wipe face while seated EOB      Pt participated in LE AROM (seated marches, knee extension), trunk control ex  with posterior<>anterior weight shifting and WB laterally onto alternating elbows, and functional anterior reach while seated with 1 UE support.    Pain Rating:  Pt did not c/o pain    Activity Tolerance:   Fair   Please refer to the flowsheet for vital signs taken during this treatment.    After treatment:   Patient left in no apparent distress in bed, Call bell within reach, Bed/ chair alarm activated, and Side rails x3    COMMUNICATION/EDUCATION:   The patient's plan of care was discussed with: physical therapist and registered nurse         Thank you for this referral.  Orland Penman, OT  Minutes: 28

## 2022-06-30 NOTE — Progress Notes (Signed)
Infectious Disease Progress    Impression    Sepsis  S/p septic shock.    E. coli bacteremia  Blood cultures 7/31+ for E. coli   2/2 LAC (pan sensitive)   Negative repeat cultures 8/4       Acute abdomen  Pneumoperitoneum  S/p diagnostic laparoscopy, laparotomy  EGD 7/31  Findings of cloudy fluid in right upper quadrant, yellow/white  Inflammatory peel over lesser curve of stomach, inferior lobe of liver  No gross perforation identified  Pancreas and retroperitoneum appeared edematous  Intra-Op fluid culture + for E. Coli (pansensitive)    Hepatic abscess  Gas and fluid containing collection 7.1 x 10.8 x 5.4 cm  In anterior left lobe of liver, patchy areas of hypodensity right lobe  4 x 4.5 x 5.5 cm collection  S/p drainage by IR 8/4.Cultures + for  few E.coli  Repeat CT 8/7 shows reduction in size of hepatic abscess  Plan was for ceftriaxone IV x6 weeks end date 9/15, Flagyl  Until 8/18, now continued due to persistent leukocytosis  Repeat CT 8/13 + for    S/p percutaneous drainage of left hepatic abscess with  Minimal fluid remaining around catheter, suggestion of peripancreatic edema, small bilateral pleural effusions  Repeat CT abdomen pelvis 8/16+ for increased peripancreatic edema suspicious for pancreatitis, small residual fluid collection in left hepatic lobe  Lipase 737          Leukocytosis, persistent  WBC 16.7  S/p abdominal wound dehiscence  Clean, no purulence per wound care  Cultures 8/17, 8/18-NG.    Gangrene, discoloration of toes  Persist        Acute hypoxic respiratory failure  Re intubated 8/13, extubated 8/19  Initially intubated 7/31, s/p extubation 8/7  CXR  8/20 + + for atelectasis  Resp cultures  8/13+ for light yeast  Apparent C.albicans/ dubliensis        AKI  Cr 4.9 on HD    Coagulopathy  Improving    Thrombocytopenia  resolved      Transaminitis  Improving    Hyperbilirubinemia   Improved  Bilirubin 1.1    Diabetes type 2  Hyperglycemia  A1c 7.7    Diarrhea  FMS present    Obesity  BMI  34.76  Plan  Continue ceftriaxone 2 g IV daily  Pt will need  a total of  6 weeks of coverage of  liver abscess planned end date 9/15.   De-escalate to p.o. therapy at 4 weeks ( 9/1) per clinical course  Continue Flagyl IV, monitor wbc  Monitor drainage   Patient would require repeat imaging prior to  De-escalation of therapy to oral antibiotics  ID service to follow.          Extensive review of chart notes, labs, imaging, cultures done  Additionally review of done:  D/w ICU team, Dr. Salena Saner    Patient seen today.  Awake, talking  D/w RN events of the day. Accordion draining greenish thick liquid      History reviewed. No pertinent past medical history.    Past Surgical History:   Procedure Laterality Date    BLADDER SURGERY N/A 06/01/2022    ENDOSCOPY OF ILEAL CONDUIT performed by Guinevere Ferrari, MD at [facility] MAIN OR    CT VISCERAL PERCUTANEOUS DRAIN  06/05/2022    CT VISCERAL PERCUTANEOUS DRAIN 06/05/2022 [facility] RAD CT    LAPAROSCOPY N/A 06/01/2022    LAPAROSCOPY DIAGNOSTIC performed by Guinevere Ferrari, MD at [facility]  LAPAROTOMY N/A 06/01/2022    LAPAROTOMY EXPLORATORY performed by Guinevere Ferrari, MD at [facility]       Allergies   Allergen Reactions    Augmentin [Amoxicillin-Pot Clavulanate] Hives     Tolerated ceftriaxone 06/2022    Codeine      Unknown reaction       Social Connections: Not on file       No family status information on file.           Review of Systems - Negative except those mentioned in H&P      PHYSICAL EXAM:  General:          Awake, in no distress  EENT:              EOMI. Anicteric sclerae. MMM  Resp:               CTA bilaterally, no wheezing or rales.  No accessory muscle use  CV:                  Regular  rhythm,  No edema  GI:                   Soft, Non distended, Non tender.  +Bowel sounds  Neurologic:      Alert and oriented X 3, normal speech,   Psych:             Good insight. Not anxious nor agitated  Skin:                No rashes.  No jaundice.  Extremities :  No edema, discoloration of   toes++.   Drain+ for green thick fluid++  Waymon Amato, MD Jerrel Ivory

## 2022-06-30 NOTE — Plan of Care (Signed)
Problem: Physical Therapy - Adult  Goal: By Discharge: Performs mobility at highest level of function for planned discharge setting.  See evaluation for individualized goals.  Description: FUNCTIONAL STATUS PRIOR TO ADMISSION: Patient was independent and active without use of DME. Drove himself to [place] from [place] for [place] race. Denies home O2 use.     HOME SUPPORT PRIOR TO ADMISSION: The patient lived with wife however wife has her own medical issues and cannot provide physical assist.    Physical Therapy Goals  Re-Assessment 06/29/22  1.  Patient will move from supine to sit and sit to supine, scoot up and down, and roll side to side in bed with maximal assist of 1 within 7 day(s).   2. Patient will sit EOB x 15 minutes with contact guard assist within 7 days.    3.  Patient will perform sit to stand with maximal assistance x2 within 7 day(s).  4.  Patient will transfer from bed to chair and chair to bed via minimal lift equipment (best mover) within 7 day(s).    Reassessed 06/20/2022   1.  Patient will move from supine to sit and sit to supine, scoot up and down, and roll side to side in bed with maximal assist of 1 within 7 day(s).   2. Patient will sit EOB x 5 minutes with contact guard assist within 7 days.    3.  Patient will perform sit to stand with maximal assistance x2 within 7 day(s).  4.  Patient will transfer from bed to chair and chair to bed via minimal lift equipment (best mover) within 7 day(s).    Initiated 06/10/2022  1.  Patient will move from supine to sit and sit to supine, scoot up and down, and roll side to side in bed with contact guard assist within 7 day(s).   2. Patient will sit EOB x 10 minutes with supervision/set-up assist within 7 days.    2.  Patient will perform sit to stand with moderate assistance x2 within 7 day(s).  3.  Patient will transfer from bed to chair and chair to bed with moderate assistance x2 using the least restrictive device within 7 day(s).  4.   Patient will ambulate with moderate assistance x2 for 5 feet with the least restrictive device within 7 day(s).   Outcome: Progressing   PHYSICAL THERAPY TREATMENT    Patient: Jaime [name] (76 y.o. male)  Date: 06/30/2022  Diagnosis: Septicemia (HCC) [A41.9]  Gastric perforation (HCC) [K25.5]  Perforated abdominal viscus [R19.8] Gastric perforation (HCC)  Procedure(s) (LRB):  LAPAROTOMY EXPLORATORY (N/A)  LAPAROSCOPY DIAGNOSTIC (N/A)  ENDOSCOPY OF ILEAL CONDUIT (N/A) 29 Days Post-Op  Precautions: Fall Risk                    ASSESSMENT:  Patient continues to benefit from skilled PT services and is slowly progressing towards goals. Patient received in bed and agreeable to participate, required max assist x 2 to roll left and come to sit EOB.  Good tolerance of seated balance activities, core activation and gentle stretching.  Left in supine with call bell in reach.  Patient remains well below indep functional baseline, is markedly weak and medically complex, would benefit from inpatient rehab.         PLAN:  Patient continues to benefit from skilled intervention to address the above impairments.  Continue treatment per established plan of care.    Recommendation for discharge: (in order for the patient to meet  his/her long term goals): Therapy 3 hours/day 5-7 days/week    Other factors to consider for discharge: patient's current support system is unable to meet their requirements for physical assistance, high risk for falls, not safe to be alone, and concern for safely navigating or managing the home environment    IF patient discharges home will need the following DME: continuing to assess with progress       SUBJECTIVE:   Patient stated, "I'll try."    OBJECTIVE DATA SUMMARY:   Critical Behavior:          Functional Mobility Training:  Bed Mobility:  Bed Mobility Training  Rolling: Maximum assistance;Assist X2  Supine to Sit: Maximum assistance;Assist X2  Sit to Supine: Maximum assistance;Assist X2  Transfers:      Balance:  Balance  Sitting: Impaired  Sitting - Static: Fair (occasional)  Sitting - Dynamic: Fair (occasional)   Ambulation/Gait Training:        Neuro Re-Education:                      Activity Tolerance:   requires rest breaks and SpO2 stable on room air    After treatment:   Patient left in no apparent distress in bed, Call bell within reach, and Side rails x3      COMMUNICATION/EDUCATION:   The patient's plan of care was discussed with: occupational therapist and registered nurse    Patient Education  Education Given To: Patient  Education Provided: Home Exercise Program  Education Method: Verbal  Barriers to Learning: None  Education Outcome: Verbalized understanding      Rica Koyanagi, PT  Minutes: 30

## 2022-06-30 NOTE — Other (Signed)
[facility]  [facility]  DIABETES MANAGEMENT     Discharge Plan    Evaluation and Action Plan   This 76 year old Caucasian male was admitted with RUQ pain and underwent laparotomy 06/01/22. Was in septic shock with multi-organ failure. Several episodes of Vtach. Remained on vent support, pressors & CRRT. A new finding of liver abscess was noted on CT; drain placed. There is ongoing concern for LLE viability due to diminished pedal pulses. Ischemia of 3 toes on right & 4 toes on the left.  Per vascular, he was not a candidate for endovascular or surgical revascularization at this time. Podiatry involved & awaiting demarcation of foot wounds before proceeding.     Moved out of ICU but experienced an acute decline (hypotension, arrhythmia & AMS) 06/14/22 & was returned to ICU. Had to be re-intubated but is now off sedation & pressor support. Infectious disease continues to follow & remains on multiple antibiotics. Off tube feeding and relying on oral intake. Renal issues addressed per nephrology. Plans to move back to [facility] area are being coordinated by SW & wife; will need dialysis, wound care & nutrition.    As for BG management in this patient with known Type 2 diabetes, treated with a single oral agent, e.g., metformin. Required insulin to offset steroids during this hospital stay as well as override TF. Off steroids & Tfs. Basal insulin stopped. Bgs in 120s. Will continue to use corrective insulin approach.    Signing off.    Discharge Plan   Management Rationale Action Plan   Medication   Corrective insulin       Based on BG pattern   If moved to a rehab facility, use a LOW corrective approach    Until kidneys recover, do NOT use metformin. May consider a renally-dosed DPP-4     Additional orders               Initial Presentation   Jaime [name] is a 76 y.o. male admitted 05/31/22 from ER after experiencing RUQ pain associated with generalized weakness, nausea & vomiting for several days  PTA. AMS+. Hypotension+. Fever+. Dyspneic+. He is visiting from [place].  Ectopic atrial tachycardia  ER exam:  Cardiovascular:      Rate and Rhythm: Regular rhythm. Tachycardia present.   Pulmonary:      Comments: He is tachypneic, coarse breath sounds on inspiration at bilateral bases  Abdominal:      Palpations: Abdomen is soft.      Comments: Tender to palpation diffusely in the right sided abdomen without guarding or rebound tenderness   LAB: WBC 16.6. Normal H&H. Low platelets.BG 272/AG 17. AKI. Elevated liver enzymes. NT pro-BNP 20,980. Lipase >3000.     CXR:   Right IJ catheter satisfactory position without pneumothorax. ET   tube and NG tube as above   CT Head: Negative  CT ABd:   Extraluminal air bubbles in the right upper quadrant concerning for gastric   perforation. Inflammatory changes are noted about the tail of the pancreas   extending into the left anterior pararenal space, please correlate with any   evidence of acute pancreatitis. Bilateral lower lobe infiltrates.     HX: DM    INITIAL DX: Septicemia (HCC) [A41.9]  Gastric perforation (HCC) [K25.5]  Perforated abdominal viscus [R19.8]     Current Treatment     TX: 06/01/22 LAPAROTOMY EXPLORATORY  LAPAROSCOPY DIAGNOSTIC ENDOSCOPY OF ILEAL CONDUIT  EGD    Diabetes History  Type 2 diabetes on metformin therapy  Checks Bgs and they are in the 100s  Is physically active    Blood glucose pattern        Bess Harvest, APRN - CNS  Clinical Nurse Specialist - Diabetes & endocrine disorders  [facility]  Access via [employer]

## 2022-06-30 NOTE — Progress Notes (Signed)
NAME: Jaime [name]        DOB:  April 10, 1946        MRN:  [id number]                     Assessment   :                                               Plan:  AKI  Pancreatitis  Hyponatremia  DM  anemia KRT initiated 7/31; now on MWF schedule; right IJ quinton 8/8; no HD today    Needs a perm cath prior to discharge - plan on for tomorrow. Needs outpatient HD placement under AKI diagnosis (lives in [place] so [facility] [place] [facility], but none so far.    H/H not at goal.  Continue ESA.              Subjective:     Chief Complaint:   No complaints. Discussed the above and [name] is agreeable     Review of Systems:    Symptom Y/N Comments  Symptom Y/N Comments   Fever/Chills    Chest Pain     Poor Appetite    Edema     Cough    Abdominal Pain     Sputum    Joint Pain     SOB/DOE    Pruritis/Rash     Nausea/vomit    Tolerating PT/OT     Diarrhea    Tolerating Diet     Constipation    Other       Could not obtain due to:      Objective:     VITALS:   Last 24hrs VS reviewed since prior progress note. Most recent are:  Vitals:    06/30/22 0156   BP: (!) 140/66   Pulse: 92   Resp: 18   Temp: 98.1 F (36.7 C)   SpO2: 95%       Intake/Output Summary (Last 24 hours) at 06/30/2022 0608  Last data filed at 06/29/2022 1809  Gross per 24 hour   Intake 3370 ml   Output 2500 ml   Net 870 ml        Telemetry Reviewed:     PHYSICAL EXAM:  General: NAD  1+ edema      Lab Data Reviewed: (see below)    Medications Reviewed: (see below)    PMH/SH reviewed - no change compared to H&P  ________________________________________________________________________  Care Plan discussed with:  Patient     Family      RN     Care Manager                    Consultant:          Comments   >50% of visit spent in counseling and coordination of care       ________________________________________________________________________  Tanna Savoy, MD     Procedures: see electronic medical records for all procedures/Xrays and details which  were not copied into this note but were reviewed prior to creation of Plan.      LABS:  Recent Labs     06/28/22  1331 06/29/22  0254   WBC 15.4* 16.7*   HGB 8.4* 8.0*   HCT 26.7* 25.7*  PLT 205 225       Recent Labs     06/28/22  1331 06/29/22  0254   NA 135*  --    K 4.7  --    CL 101  --    CO2 27  --    BUN 97*  --    MG  --  2.8*   PHOS  --  5.7*       Recent Labs     06/28/22  1331 06/29/22  0254   GLOB 5.1* 5.3*       No results for input(s): INR, APTT in the last 72 hours.    Invalid input(s): PTP   No results for input(s): TIBC, FERR in the last 72 hours.    Invalid input(s): FE, PSAT   No results found for: FOL, RBCF   No results for input(s): PH, PCO2, PO2 in the last 72 hours.  No results for input(s): CPK, CKMB, TROPONINI in the last 72 hours.  No components found for: [facility]  @labua @    MEDICATIONS:  Current Facility-Administered Medications   Medication Dose Route Frequency    heparin (porcine) injection 5,000 Units  5,000 Units SubCUTAneous 3 times per day    cefTRIAXone (ROCEPHIN) 2,000 mg in sodium chloride 0.9 % 50 mL IVPB (mini-bag)  2,000 mg IntraVENous Q24H    metronidazole (FLAGYL) 500 mg in 0.9% NaCl 100 mL IVPB premix  500 mg IntraVENous q8h    therapeutic multivitamin-minerals 1 tablet  1 tablet Per NG tube Daily    amiodarone (CORDARONE) tablet 200 mg  200 mg Per NG tube Daily    acidophilus probiotic capsule 1 capsule  1 capsule Oral Daily    lidocaine 2 % injection 20 mL  20 mL IntraDERmal Once    collagenase ointment   Topical Daily    epoetin alfa-epbx (RETACRIT) injection 6,000 Units  6,000 Units SubCUTAneous Once per day on Mon Wed Fri    fentaNYL (SUBLIMAZE) injection 25 mcg  25 mcg IntraVENous Q1H PRN    oxyCODONE (ROXICODONE) immediate release tablet 5 mg  5 mg Oral Q4H PRN    midodrine (PROAMATINE) tablet 5 mg  5 mg Oral Daily PRN    insulin lispro (HUMALOG) injection vial 0-4  Units  0-4 Units SubCUTAneous 4 times per day    bisacodyl (DULCOLAX) suppository 10 mg  10 mg Rectal Daily PRN    albumin human 25% IV solution 25 g  25 g IntraVENous PRN    heparin (porcine) injection 1,200 Units  1,200 Units IntraCATHeter PRN    And    heparin (porcine) injection 1,000 Units  1,000 Units IntraCATHeter PRN    sodium chloride 0.9 % bolus 100 mL  100 mL IntraVENous PRN    balsum peru-castor oil (VENELEX) ointment   Topical BID    glucose chewable tablet 16 g  4 tablet Oral PRN    dextrose bolus 10% 125 mL  125 mL IntraVENous PRN    Or    dextrose bolus 10% 250 mL  250 mL IntraVENous PRN    glucagon (rDNA) injection 1 mg  1 mg SubCUTAneous PRN    dextrose 10 % infusion   IntraVENous Continuous PRN    sodium chloride flush 0.9 % injection 5-40 mL  5-40 mL IntraVENous 2 times per day    sodium chloride flush 0.9 % injection 5-40 mL  5-40 mL IntraVENous PRN    0.9 % sodium chloride infusion   IntraVENous PRN  acetaminophen (TYLENOL) tablet 650 mg  650 mg Oral Q6H PRN    Or    acetaminophen (TYLENOL) suppository 650 mg  650 mg Rectal Q6H PRN    ondansetron (ZOFRAN) injection 4 mg  4 mg IntraVENous Q6H PRN

## 2022-07-01 ENCOUNTER — Inpatient Hospital Stay: Admit: 2022-07-01 | Payer: MEDICARE

## 2022-07-01 LAB — CBC
Hematocrit: 27.5 % — ABNORMAL LOW (ref 36.6–50.3)
Hemoglobin: 8.4 g/dL — ABNORMAL LOW (ref 12.1–17.0)
MCH: 31.7 PG (ref 26.0–34.0)
MCHC: 30.5 g/dL (ref 30.0–36.5)
MCV: 103.8 FL — ABNORMAL HIGH (ref 80.0–99.0)
MPV: 10.9 FL (ref 8.9–12.9)
Nucleated RBCs: 0 PER 100 WBC
Platelets: 208 10*3/uL (ref 150–400)
RBC: 2.65 M/uL — ABNORMAL LOW (ref 4.10–5.70)
RDW: 18.6 % — ABNORMAL HIGH (ref 11.5–14.5)
WBC: 13.6 10*3/uL — ABNORMAL HIGH (ref 4.1–11.1)
nRBC: 0 10*3/uL (ref 0.00–0.01)

## 2022-07-01 LAB — HEPATIC FUNCTION PANEL
ALT: 21 U/L (ref 12–78)
AST: 39 U/L — ABNORMAL HIGH (ref 15–37)
Albumin/Globulin Ratio: 0.3 — ABNORMAL LOW (ref 1.1–2.2)
Albumin: 1.6 g/dL — ABNORMAL LOW (ref 3.5–5.0)
Alk Phosphatase: 209 U/L — ABNORMAL HIGH (ref 45–117)
Bilirubin, Direct: 0.7 MG/DL — ABNORMAL HIGH (ref 0.0–0.2)
Globulin: 5 g/dL — ABNORMAL HIGH (ref 2.0–4.0)
Total Bilirubin: 1.1 MG/DL — ABNORMAL HIGH (ref 0.2–1.0)
Total Protein: 6.6 g/dL (ref 6.4–8.2)

## 2022-07-01 LAB — POCT GLUCOSE
POC Glucose: 109 mg/dL (ref 65–117)
POC Glucose: 89 mg/dL (ref 65–117)

## 2022-07-01 LAB — HEPATITIS B SURFACE ANTIGEN
Hep B S Ag Interp: NEGATIVE
Hepatitis B Surface Ag: <0.1 Index

## 2022-07-01 LAB — PHOSPHORUS: Phosphorus: 5.6 MG/DL — ABNORMAL HIGH (ref 2.6–4.7)

## 2022-07-01 LAB — MAGNESIUM: Magnesium: 2.5 mg/dL — ABNORMAL HIGH (ref 1.6–2.4)

## 2022-07-01 MED ORDER — LIDOCAINE HCL 2 % IJ SOLN
2 % | Freq: Once | INTRAMUSCULAR | Status: AC
Start: 2022-07-01 — End: 2022-07-01
  Administered 2022-07-01: 15:00:00 12 mL via INTRADERMAL

## 2022-07-01 MED ORDER — LIDOCAINE HCL 2 % IJ SOLN
2 % | INTRAMUSCULAR | Status: AC
Start: 2022-07-01 — End: ?

## 2022-07-01 MED ORDER — FENTANYL CITRATE (PF) 100 MCG/2ML IJ SOLN
100 MCG/2ML | INTRAMUSCULAR | Status: DC | PRN
Start: 2022-07-01 — End: 2022-07-01
  Administered 2022-07-01: 16:00:00 25 ug via INTRAVENOUS

## 2022-07-01 MED ORDER — MIDAZOLAM HCL 5 MG/5ML IJ SOLN
5 MG/ML | INTRAMUSCULAR | Status: DC | PRN
Start: 2022-07-01 — End: 2022-07-01
  Administered 2022-07-01: 15:00:00 1 mg via INTRAVENOUS

## 2022-07-01 MED ORDER — HEPARIN (PORCINE) IN NACL 1000-0.9 UT/500ML-% IV SOLN
Freq: Once | INTRAVENOUS | Status: AC
Start: 2022-07-01 — End: 2022-07-01
  Administered 2022-07-01: 15:00:00 60 mL

## 2022-07-01 MED ORDER — HEPARIN SODIUM (PORCINE) 5000 UNIT/ML IJ SOLN
5000 UNIT/ML | INTRAMUSCULAR | Status: AC
Start: 2022-07-01 — End: ?

## 2022-07-01 MED ORDER — FENTANYL CITRATE (PF) 100 MCG/2ML IJ SOLN
100 MCG/2ML | INTRAMUSCULAR | Status: AC
Start: 2022-07-01 — End: ?

## 2022-07-01 MED ORDER — MIDAZOLAM 5 MG/5ML INJ (NDG ONLY)
5 MG/ML | INTRAMUSCULAR | Status: AC
Start: 2022-07-01 — End: ?

## 2022-07-01 MED ORDER — HEPARIN (PORCINE) IN NACL 1000-0.9 UT/500ML-% IV SOLN
INTRAVENOUS | Status: AC
Start: 2022-07-01 — End: ?

## 2022-07-01 MED ORDER — HEPARIN SODIUM (PORCINE) 1000 UNIT/ML IJ SOLN
1000 UNIT/ML | INTRAMUSCULAR | Status: AC
Start: 2022-07-01 — End: ?

## 2022-07-01 MED ORDER — HEPARIN SODIUM (PORCINE) 1000 UNIT/ML IJ SOLN
1000 UNIT/ML | Freq: Once | INTRAMUSCULAR | Status: AC
Start: 2022-07-01 — End: 2022-07-01
  Administered 2022-07-01: 16:00:00 3600 [IU]

## 2022-07-01 MED FILL — FENTANYL CITRATE (PF) 100 MCG/2ML IJ SOLN: 100 MCG/2ML | INTRAMUSCULAR | Qty: 2

## 2022-07-01 MED FILL — MIDAZOLAM HCL 5 MG/5ML IJ SOLN: 5 MG/ML | INTRAMUSCULAR | Qty: 5

## 2022-07-01 MED FILL — HEPARIN SODIUM (PORCINE) 5000 UNIT/ML IJ SOLN: 5000 UNIT/ML | INTRAMUSCULAR | Qty: 1

## 2022-07-01 MED FILL — OXYCODONE HCL 5 MG PO TABS: 5 MG | ORAL | Qty: 1

## 2022-07-01 MED FILL — THERA M PLUS PO TABS: ORAL | Qty: 1

## 2022-07-01 MED FILL — RETACRIT 3000 UNIT/ML IJ SOLN: 3000 UNIT/ML | INTRAMUSCULAR | Qty: 2

## 2022-07-01 MED FILL — RISAQUAD-2 PO CAPS: ORAL | Qty: 1

## 2022-07-01 MED FILL — METRONIDAZOLE 500 MG/100ML IV SOLN: 500 MG/100ML | INTRAVENOUS | Qty: 100

## 2022-07-01 MED FILL — AMIODARONE HCL 200 MG PO TABS: 200 MG | ORAL | Qty: 1

## 2022-07-01 MED FILL — SANTYL 250 UNIT/GM EX OINT: 250 UNIT/GM | CUTANEOUS | Qty: 30

## 2022-07-01 MED FILL — HEPARIN (PORCINE) IN NACL 1000-0.9 UT/500ML-% IV SOLN: INTRAVENOUS | Qty: 500

## 2022-07-01 MED FILL — ALBUTEIN 25 % IV SOLN: 25 % | INTRAVENOUS | Qty: 100

## 2022-07-01 MED FILL — HEPARIN SODIUM (PORCINE) 1000 UNIT/ML IJ SOLN: 1000 UNIT/ML | INTRAMUSCULAR | Qty: 10

## 2022-07-01 MED FILL — LIDOCAINE HCL 2 % IJ SOLN: 2 % | INTRAMUSCULAR | Qty: 20

## 2022-07-01 NOTE — Care Coordination-Inpatient (Addendum)
2585 - VM received from [facility] liaison, Thayer Ohm, regarding patient needing a diagnosis of ESRD for [facility] to provide HD to patient. Patient will need to be set up with an OP HD center as well.    1628 - CM inquired about potential LTAC in [place] for patient on discharge.      Ronnell Freshwater, BSN, RN    Care Management  [phone]

## 2022-07-01 NOTE — Other (Signed)
Primary RN SBAR: Arminda Resides, RN  Patient Education: HD Treatment  Hepatitis B Surface Ag   Date/Time Value Ref Range Status   06/29/2022 09:53 AM <0.10 Index Final     Hep B S Ab   Date/Time Value Ref Range Status   06/29/2022 09:53 AM <3.10 mIU/mL Final        07/01/22 1215   Vital Signs   BP (!) 159/75   Temp 97.8 F (36.6 C)   Pulse 84   Respirations 20   Pain Assessment   Pain Assessment None - Denies Pain   Treatment   Time On 1215   Treatment Goal 2000 ml   Observations & Evaluations   Level of Consciousness 0   Oriented X 4   Heart Rhythm Regular   Respiratory Quality/Effort Unlabored   O2 Device None (Room air)   Bilateral Breath Sounds Diminished   Skin Color Ashen   Skin Condition/Temp Dry;Warm   Appetite Fair   Abdomen Inspection Soft   Bowel Sounds (All Quadrants) Active   Edema Right upper extremity;Left upper extremity;Right lower extremity;Left lower extremity   RUE Edema +2   LUE Edema +2   RLE Edema +1;+2   LLE Edema +1;+2   Technical Checks   Dialysis Machine No. 03   RO Machine Number [id number]   Dialyzer Lot No. [id number]   Tubing Lot Number [id number]   All Connections Secure Yes   NS Bag Yes   Saline Line Double Clamped Yes   Dialyzer Revaclear 300   Prime Volume (mL) 200 mL   ICEBOAT I;C;E;B;O;A;T   RO Machine Log Sheet Completed Yes   Machine Alarm Self Test Completed;Passed   Child psychotherapist Function   Extracorporeal Chemical engineer Conductivity 13.8   Manual Ph 7.2   Bleach Test (Neg) Yes   Bath Temperature 98.6 F (37 C)   Treatment Initiation   Dialyze Hours 3   Treatment  Initiation Universal Precautions maintained;Lines secured to patient;Connections secured;Prime given;Venous Parameters set;Arterial Parameters set;IT consultant engaged;Saline line double clamped;Revaclear Dialyzer   Dialysis Bath   K+ (Potassium) 3   Ca+ (Calcium) 2.5   Na+ (Sodium) 139   HCO3 (Bicarb) 35   [REMOVED] Hemodialysis Central Access Right Neck   Removal  Date/Time: 07/01/22 1130  Placement Date/Time: 06/09/22 1830   Present on Admission/Arrival: No  Inserted by: dr. Rutherford Limerick  Orientation: Right  Access Location: Neck  Cath Tip Intact: Yes   Continued need for line? Yes   Site Assessment Clean, dry & intact   Venous Lumen Status Blood return noted;Flushed   Arterial Lumen Status Blood return noted;Flushed   Alcohol Cap Used Yes   Line Care Connections checked and tightened;Ports disinfected   Dressing Type Bacteriocidal;Transparent   Date of Last Dressing Change 07/01/22   Dressing Status Clean, dry & intact   Dressing Intervention New   Dressing Change Due 07/03/22

## 2022-07-01 NOTE — Progress Notes (Signed)
Occupational Therapy   Chart reviewed; off unit for HD; will defer and continue to follow. Biagio Borg OTR/L

## 2022-07-01 NOTE — Progress Notes (Addendum)
NAME: Jaime [name]        DOB:  1946/03/02        MRN:  [id number]                     Assessment   :                                               Plan:  AKI  Pancreatitis  Hyponatremia  DM  anemia KRT initiated 7/31; now on MWF schedule; right IJ quinton 8/8;  HD today    perm cath today. Needs outpatient HD placement under AKI diagnosis - CM working on options (here or back in [place])    Watch for renal recovery, but none so far.    H/H not at goal.  Continue ESA.              Subjective:     Chief Complaint:   No complaints. We again discussed the above and Mr. Finger is agreeable     Addendum: The patient was seen on dialysis at 12:15 pm .  BP is stable. Catheter is functioning well.      Review of Systems:    Symptom Y/N Comments  Symptom Y/N Comments   Fever/Chills    Chest Pain     Poor Appetite    Edema     Cough    Abdominal Pain     Sputum    Joint Pain     SOB/DOE    Pruritis/Rash     Nausea/vomit    Tolerating PT/OT     Diarrhea    Tolerating Diet     Constipation    Other       Could not obtain due to:      Objective:     VITALS:   Last 24hrs VS reviewed since prior progress note. Most recent are:  Vitals:    07/01/22 0007   BP: 139/68   Pulse:    Resp:    Temp: 98.3 F (36.8 C)   SpO2:        Intake/Output Summary (Last 24 hours) at 07/01/2022 0608  Last data filed at 06/30/2022 1945  Gross per 24 hour   Intake 570 ml   Output 50 ml   Net 520 ml        Telemetry Reviewed:     PHYSICAL EXAM:  General: NAD  1+ edema      Lab Data Reviewed: (see below)    Medications Reviewed: (see below)    PMH/SH reviewed - no change compared to H&P  ________________________________________________________________________  Care Plan discussed with:  Patient     Family      RN     Care Manager                    Consultant:          Comments   >50% of visit spent in counseling and coordination of care        ________________________________________________________________________  Tanna Savoy, MD     Procedures: see electronic medical records for all procedures/Xrays and details which  were not copied into this note but were reviewed prior to creation of Plan.      LABS:  Recent Labs     06/29/22  0254 07/01/22  0440   WBC  16.7* 13.6*   HGB 8.0* 8.4*   HCT 25.7* 27.5*   PLT 225 208       Recent Labs     06/28/22  1331 06/29/22  0254 06/30/22  1251 07/01/22  0440   NA 135*  --  135*  --    K 4.7  --  4.5  --    CL 101  --  100  --    CO2 27  --  27  --    BUN 97*  --  69*  --    MG  --  2.8*  --  2.5*   PHOS  --  5.7*  --  5.6*       Recent Labs     06/29/22  0254 06/30/22  1251 07/01/22  0440   GLOB 5.3* 5.2* 5.0*       No results for input(s): INR, APTT in the last 72 hours.    Invalid input(s): PTP   No results for input(s): TIBC, FERR in the last 72 hours.    Invalid input(s): FE, PSAT   No results found for: FOL, RBCF   No results for input(s): PH, PCO2, PO2 in the last 72 hours.  No results for input(s): CPK, CKMB, TROPONINI in the last 72 hours.  No components found for: [facility]  @labua @    MEDICATIONS:  Current Facility-Administered Medications   Medication Dose Route Frequency    heparin (porcine) injection 5,000 Units  5,000 Units SubCUTAneous 3 times per day    cefTRIAXone (ROCEPHIN) 2,000 mg in sodium chloride 0.9 % 50 mL IVPB (mini-bag)  2,000 mg IntraVENous Q24H    metronidazole (FLAGYL) 500 mg in 0.9% NaCl 100 mL IVPB premix  500 mg IntraVENous q8h    therapeutic multivitamin-minerals 1 tablet  1 tablet Per NG tube Daily    amiodarone (CORDARONE) tablet 200 mg  200 mg Per NG tube Daily    acidophilus probiotic capsule 1 capsule  1 capsule Oral Daily    lidocaine 2 % injection 20 mL  20 mL IntraDERmal Once    collagenase ointment   Topical Daily    epoetin alfa-epbx (RETACRIT) injection 6,000 Units  6,000 Units SubCUTAneous Once per day on Mon Wed Fri    fentaNYL (SUBLIMAZE) injection 25 mcg  25 mcg  IntraVENous Q1H PRN    oxyCODONE (ROXICODONE) immediate release tablet 5 mg  5 mg Oral Q4H PRN    midodrine (PROAMATINE) tablet 5 mg  5 mg Oral Daily PRN    insulin lispro (HUMALOG) injection vial 0-4 Units  0-4 Units SubCUTAneous 4 times per day    bisacodyl (DULCOLAX) suppository 10 mg  10 mg Rectal Daily PRN    albumin human 25% IV solution 25 g  25 g IntraVENous PRN    heparin (porcine) injection 1,200 Units  1,200 Units IntraCATHeter PRN    And    heparin (porcine) injection 1,000 Units  1,000 Units IntraCATHeter PRN    sodium chloride 0.9 % bolus 100 mL  100 mL IntraVENous PRN    balsum peru-castor oil (VENELEX) ointment   Topical BID    glucose chewable tablet 16 g  4 tablet Oral PRN    dextrose bolus 10% 125 mL  125 mL IntraVENous PRN    Or    dextrose bolus 10% 250 mL  250 mL IntraVENous PRN    glucagon (rDNA) injection 1 mg  1 mg SubCUTAneous PRN    dextrose 10 % infusion  IntraVENous Continuous PRN    sodium chloride flush 0.9 % injection 5-40 mL  5-40 mL IntraVENous 2 times per day    sodium chloride flush 0.9 % injection 5-40 mL  5-40 mL IntraVENous PRN    0.9 % sodium chloride infusion   IntraVENous PRN    acetaminophen (TYLENOL) tablet 650 mg  650 mg Oral Q6H PRN    Or    acetaminophen (TYLENOL) suppository 650 mg  650 mg Rectal Q6H PRN    ondansetron (ZOFRAN) injection 4 mg  4 mg IntraVENous Q6H PRN

## 2022-07-01 NOTE — Progress Notes (Signed)
Bedside shift report received from Fritzi Mandes, [place]. Report included the following information SBAR, Kardex, MAR, and Recent Results. This RN verbalized understanding of plan of care with opportunity for clarification and questions. Care of patient assumed at this time. Dual skin check performed at this time.     Bedside shift report given to jasmine, RN. Report included the following information SBAR, Kardex, MAR, and Recent Results. Oncoming RN verbalized understanding of plan of care with opportunity for clarification and questions. Dual skin check performed at this time.

## 2022-07-01 NOTE — Progress Notes (Incomplete)
Bedside and Verbal shift change report given to [facility] (Cabin crew) by Neldon Labella (offgoing nurse). Report included the following information Nurse Handoff Report, Index, Intake/Output, MAR, Recent Results, and Cardiac Rhythm NSR .          2139 Notified NP that Pt had 6 beats V-Tach, pt is asymptomatic with no complaints of chest pain. VSS. New order for BMP for AM labs

## 2022-07-01 NOTE — Progress Notes (Signed)
Hospitalist Progress Note    NAME:   Jaime [name]   DOB: 11-12-1945   MRN: [id number]     Date/Time: 07/01/2022 11:54 AM  Patient PCP: No primary care provider on file.    Estimated discharge date:8/29  Barriers: permcath today , snf placement , hd set up at rehab       For reference :   76 y/m with prolonged hospitalization, was admitted to surgical service on 7/31 for perforated hollow viscus underwent emergent laparotomy/ septic shock d/t peritonitis, was intubated and extubated x 2. Also had AKI needing CVVH, now on HD. Also has bilateral foot gangrene. Was transferred to hospitalist service on 8/21.      Assessment / Plan:  #Septic shock- resolved   D/t perforated viscus  S/p Laparotomy 7/31, with wound vac drainage   Liver abscess s/p IR drainage  -Now off pressors  -General surgery and ID following  -Blood culture 7/31 and 8/4  -Culture grew E coli  -Respiratory culture - 8/13- candida ,Wound culture on 8/17- ng , anerobic culture  - 8/18 - ng   -S/P Cefepime, flagyl, Fluconazole and on ceftriaxone from 8/24 - to be continued for a total of 4 - 6 weeks of antibiotics .  We will arrange  -Metronidazole was added by id for elevated blood counts on 8/25  -Wound care following  -Liver abscess -S/p IR drainage on 8/4, culture positive for E coli, Drain exchange on 8/22 , bilirubin and lft improving   -NG tube was removed on 8/27 , patient able to tolerated diet   -Discussed with case management regarding discharge disposition - wife needs to set up financial assistance for transfer of patient to [place] . Other option could be to go to an SNF in [place] [facility] ) .         #Acute hypoxemic respiratory failure:  Intubated 7/31, extubation 8/7  -Reintubated 8/13, extubated on 8/19  -Currently on 2 liters [place]  -Weaned off o2      #Dysphagia:  -Ongoing SLP evaluation-patient tolerating small diet .   -Aspiration on FEES study  -Follow-up SLP evaluation     #Acute kidney injury:  -Now on HD  -Continue with  HD  -Nephrology following-planned for permcath in am tomorrow .   -Case management informed the need to set up HD at SNF prior to discharge .  -Will follow up cmp .     #Bilateral lower extremity critical limb ischemia  Gangrene both feet  -Podiatry and vascular surgery following  -Waiting for demarcation - will follow up once demarcation      #Diabetes mellitus:  -Sliding scale and lantus  -Monitor FS  -Diabetes team following     #Sacral wound:  -Wound care following       Medical Decision Making:   I personally reviewed labs:na   I personally reviewed imaging: na   I personally reviewed EKG: None  Toxic drug monitoring: Monitor creatinine with abx  Discussed case with: RN, patient     Code Status: Full  DVT Prophylaxis: heparin  GI Prophylaxis:none   Subjective:     Chief Complaint / Reason for Physician Visit  Patient admitted for hollow viscus perforation and being managed with wound care . He also had aki - needing hd . Flagyl added by id since 8/25.     Labs and charts reviewed and patient examined . Patient appeared comfortable and in no distress .         Objective:  VITALS:   Last 24hrs VS reviewed since prior progress note. Most recent are:  Patient Vitals for the past 24 hrs:   BP Temp Temp src Pulse Resp SpO2   07/01/22 1130 139/67 -- -- 88 20 99 %   07/01/22 1125 (!) 151/68 -- -- 90 20 99 %   07/01/22 1120 (!) 144/69 -- -- 88 22 99 %   07/01/22 1115 (!) 155/82 -- -- 87 -- 99 %   07/01/22 0755 136/71 98.7 F (37.1 C) -- 85 -- 91 %   07/01/22 0007 139/68 98.3 F (36.8 C) Oral -- -- --   06/30/22 1945 (!) 140/84 98.5 F (36.9 C) Oral 91 18 93 %   06/30/22 1856 -- -- -- -- 20 --   06/30/22 1826 -- -- -- -- 20 --   06/30/22 1442 137/61 98.3 F (36.8 C) Oral 87 19 95 %   06/30/22 1300 (!) 144/62 98 F (36.7 C) Oral 74 20 94 %           Intake/Output Summary (Last 24 hours) at 07/01/2022 1154  Last data filed at 06/30/2022 1945  Gross per 24 hour   Intake 570 ml   Output 50 ml   Net 520 ml          I  had a face to face encounter and independently examined this patient on 07/01/2022, as outlined below:  PHYSICAL EXAM:  General:          Alert, cooperative  EENT:              EOMI. Anicteric sclerae.Rt. hd catn - jugular   Resp:               CTA bilaterally, no wheezing or rales.  No accessory muscle use  CV:                  Regular  rhythm,  No edema, AS murmur   GI:                   Soft, Non distended,Mildly tender.  +Bowel sounds, wound vac in place , drain on rt upper quadrant - purulent material   Neurologic:       Alert and oriented X 3, normal speech,   Psych:   Good insight. Not anxious nor agitated  Skin:                No rashes.  No jaundice  Foot :  bilateral feet - gangrene of first second and third toes , not yet full demarcation           Reviewed most current lab test results and cultures  YES  Reviewed most current radiology test results   YES  Review and summation of old records today    NO  Reviewed patient's current orders and MAR    YES  PMH/SH reviewed - no change compared to H&P  ________________________________________________________________________  Care Plan discussed with:    Comments   Patient x    Family      RN x    Care Manager     Consultant                        Multidiciplinary team rounds were held today with case manager, nursing, pharmacist and Higher education careers adviser.  Patient's plan of care was discussed; medications were reviewed and discharge planning was addressed.     ________________________________________________________________________  Total  NON critical care TIME:  53  Minutes    Total CRITICAL CARE TIME Spent:   Minutes non procedure based      Comments   >50% of visit spent in counseling and coordination of care     ________________________________________________________________________  Consepcion Hearing, MD     Procedures: see electronic medical records for all procedures/Xrays and details which were not copied into this note but were reviewed prior to creation  of Plan.      LABS:  I reviewed today's most current labs and imaging studies.  Pertinent labs include:  Recent Labs     06/28/22  1331 06/29/22  0254 07/01/22  0440   WBC 15.4* 16.7* 13.6*   HGB 8.4* 8.0* 8.4*   HCT 26.7* 25.7* 27.5*   PLT 205 225 208       Recent Labs     06/28/22  1331 06/29/22  0254 06/30/22  1251 07/01/22  0440   NA 135*  --  135*  --    K 4.7  --  4.5  --    CL 101  --  100  --    CO2 27  --  27  --    GLUCOSE 159*  --  149*  --    BUN 97*  --  69*  --    CREATININE 5.60*  --  4.76*  --    CALCIUM 8.4*  --  8.2*  --    MG  --  2.8*  --  2.5*   PHOS  --  5.7*  --  5.6*   LABALBU 1.6* 1.5* 1.7* 1.6*   BILITOT 1.1* 1.1* 1.2* 1.1*   AST 37 38* 37 39*   ALT 21 19 19 21          Signed: , MD

## 2022-07-01 NOTE — Progress Notes (Signed)
Speech Pathology Note    Chart reviewed. Note patient NPO for permacath placement per chart review. Will defer SLP visit on this date as patient is not appropriate for PO trials and will continue to follow. Thank you.    Katy Apo, M.S., CCC-SLP

## 2022-07-01 NOTE — Other (Signed)
07/01/22 1515   Vital Signs   BP (!) 147/76   Temp 98.3 F (36.8 C)   Pulse 88   Respirations 20   Pain Assessment   Pain Assessment None - Denies Pain   Post-Hemodialysis Assessment   Post-Treatment Procedures Blood returned;Catheter capped, clamped and heparinized x 2 ports   Machine Disinfection Process Acid/Vinegar Clean;Heat Disinfect;Exterior Engineer, agricultural Volume (ml) 300 ml   Blood Volume Processed (Liters) 51.1 L   Dialyzer Clearance Clear   Duration of Treatment (minutes) 180 minutes   Hemodialysis Intake (ml) 500 ml   Hemodialysis Output (ml) 2500 ml   NET Removed (ml) 2000   Tolerated Treatment Good   Bilateral Breath Sounds Diminished   Edema Right upper extremity;Left upper extremity;Right lower extremity   RUE Edema +2   LUE Edema +2   RLE Edema +1;+2   LLE Edema +1;+2   Time Off 1515   Patient Disposition Return to room     Primary RN SBAR: Arminda Resides, RN  Comments: Pt tolerated treatment well without complaints or complications.

## 2022-07-01 NOTE — Progress Notes (Signed)
Physical Therapy    Chart reviewed, patient is currently off the floor for HD, will defer and continue to follow.    Morry Veiga,MSPT

## 2022-07-02 LAB — POCT GLUCOSE
POC Glucose: 154 mg/dL — ABNORMAL HIGH (ref 65–117)
POC Glucose: 113 mg/dL (ref 65–117)
POC Glucose: 143 mg/dL — ABNORMAL HIGH (ref 65–117)
POC Glucose: 181 mg/dL — ABNORMAL HIGH (ref 65–117)

## 2022-07-02 LAB — BASIC METABOLIC PANEL
Anion Gap: 8 mmol/L (ref 5–15)
BUN: 50 MG/DL — ABNORMAL HIGH (ref 6–20)
Bun/Cre Ratio: 12 (ref 12–20)
CO2: 24 mmol/L (ref 21–32)
Calcium: 7.7 MG/DL — ABNORMAL LOW (ref 8.5–10.1)
Chloride: 104 mmol/L (ref 97–108)
Creatinine: 4.02 MG/DL — ABNORMAL HIGH (ref 0.70–1.30)
Est, Glom Filt Rate: 15 mL/min/{1.73_m2} — ABNORMAL LOW (ref >60)
Glucose: 114 mg/dL — ABNORMAL HIGH (ref 65–100)
Potassium: 4.2 mmol/L (ref 3.5–5.1)
Sodium: 136 mmol/L (ref 136–145)

## 2022-07-02 MED FILL — AMIODARONE HCL 200 MG PO TABS: 200 MG | ORAL | Qty: 1

## 2022-07-02 MED FILL — CEFTRIAXONE SODIUM 2 G IJ SOLR: 2 g | INTRAMUSCULAR | Qty: 2000

## 2022-07-02 MED FILL — METRONIDAZOLE 500 MG/100ML IV SOLN: 500 MG/100ML | INTRAVENOUS | Qty: 100

## 2022-07-02 MED FILL — RISAQUAD-2 PO CAPS: ORAL | Qty: 1

## 2022-07-02 MED FILL — HEPARIN SODIUM (PORCINE) 5000 UNIT/ML IJ SOLN: 5000 UNIT/ML | INTRAMUSCULAR | Qty: 1

## 2022-07-02 MED FILL — ACETAMINOPHEN 325 MG PO TABS: 325 MG | ORAL | Qty: 2

## 2022-07-02 MED FILL — THERA M PLUS PO TABS: ORAL | Qty: 1

## 2022-07-02 NOTE — Progress Notes (Signed)
Hospitalist Progress Note    NAME:   Jaime [name]   DOB: Oct 02, 1946   MRN: [id number]     Date/Time: 07/02/2022 2:24 PM  Patient PCP: No primary care provider on file.    Estimated discharge date:9/2  Barriers: permcath today , snf placement , hd set up at rehab, vascular, podiatry, limb ischemia demarcation      For reference :   76 y/m with prolonged hospitalization, was admitted to surgical service on 7/31 for perforated hollow viscus underwent emergent laparotomy/ septic shock d/t peritonitis, was intubated and extubated x 2. Also had AKI needing CVVH, now on HD. Also has bilateral foot gangrene. Was transferred to hospitalist service on 8/21.      Assessment / Plan:  #Septic shock- resolved   D/t perforated viscus  S/p Laparotomy 7/31, with wound vac drainage   Liver abscess s/p IR drainage  -Now off pressors  -General surgery and ID following  -Blood culture 7/31 and 8/4  -Culture grew E coli  -Respiratory culture - 8/13- candida ,Wound culture on 8/17- ng , anerobic culture  - 8/18 - ng   -S/P Cefepime, flagyl, Fluconazole and on ceftriaxone from 8/24 - to be continued for a total of 4 - 6 weeks of antibiotics .  We will arrange  -Metronidazole was added by id for elevated blood counts on 8/25  -Wound care following  -Liver abscess -S/p IR drainage on 8/4, culture positive for E coli, Drain exchange on 8/22 , bilirubin and lft improving   -NG tube was removed on 8/27 , patient able to tolerated diet   -Discussed with case management regarding discharge disposition - wife needs to set up financial assistance for transfer of patient to [place] . Other option could be to go to an SNF in [place] [facility] ) .         #Acute hypoxemic respiratory failure:  Intubated 7/31, extubation 8/7  -Reintubated 8/13, extubated on 8/19  -Currently on 2 liters [place]  -Weaned off o2      #Dysphagia:  -Ongoing SLP evaluation-patient tolerating small diet .   -Aspiration on FEES study  -Follow-up SLP evaluation     #Acute kidney  injury:  -Now on HD  -Continue with HD  -Nephrology following-planned for permcath in am tomorrow .   -Case management informed the need to set up HD at SNF prior to discharge .  -Will follow up cmp .     #Bilateral lower extremity critical limb ischemia  Gangrene both feet  -Podiatry and vascular surgery following  -Waiting for demarcation - will follow up once demarcation       #Diabetes mellitus:  -Sliding scale and lantus  -Monitor FS  -Diabetes team following     #Sacral wound:  -Wound care following       Medical Decision Making:   I personally reviewed labs:na   I personally reviewed imaging: na   I personally reviewed EKG: None  Toxic drug monitoring: Monitor creatinine with abx  Discussed case with: RN, patient     Code Status: Full  DVT Prophylaxis: heparin  GI Prophylaxis:none   Subjective:     Chief Complaint / Reason for Physician Visit  Patient admitted for hollow viscus perforation and being managed with wound care . He also had aki - needing hd . Flagyl added by id since 8/25.     Labs and charts reviewed and patient examined . Patient appeared comfortable and in no distress .  Objective:     VITALS:   Last 24hrs VS reviewed since prior progress note. Most recent are:  Patient Vitals for the past 24 hrs:   BP Temp Temp src Pulse Resp SpO2   07/02/22 1030 135/74 98.2 F (36.8 C) -- 94 18 95 %   07/02/22 0715 130/61 98.4 F (36.9 C) Oral 94 20 95 %   07/02/22 0430 (!) 146/68 98.6 F (37 C) Axillary 90 20 93 %   07/02/22 0018 124/64 99.1 F (37.3 C) Axillary 97 18 97 %   07/01/22 2100 126/88 98.1 F (36.7 C) Axillary 84 20 96 %   07/01/22 1537 (!) 144/72 98 F (36.7 C) Oral 85 19 94 %   07/01/22 1515 (!) 147/76 98.3 F (36.8 C) -- 88 20 --   07/01/22 1500 (!) 155/76 -- -- 81 -- --   07/01/22 1445 135/69 -- -- 88 -- --   07/01/22 1430 123/67 -- -- 82 -- --           Intake/Output Summary (Last 24 hours) at 07/02/2022 1424  Last data filed at 07/02/2022 0830  Gross per 24 hour   Intake 500 ml    Output 2500 ml   Net -2000 ml          I had a face to face encounter and independently examined this patient on 07/02/2022, as outlined below:  PHYSICAL EXAM:  General:          Alert, cooperative  EENT:              EOMI. Anicteric sclerae.Rt. hd catn - jugular   Resp:               CTA bilaterally, no wheezing or rales.  No accessory muscle use  CV:                  Regular  rhythm,  No edema, AS murmur   GI:                   Soft, Non distended,Mildly tender.  +Bowel sounds, wound vac in place , drain on rt upper quadrant - purulent material   Neurologic:       Alert and oriented X 3, normal speech,   Psych:   Good insight. Not anxious nor agitated  Skin:                No rashes.  No jaundice  Foot :  bilateral feet - gangrene of first second and third toes , not yet full demarcation           Reviewed most current lab test results and cultures  YES  Reviewed most current radiology test results   YES  Review and summation of old records today    NO  Reviewed patient's current orders and MAR    YES  PMH/SH reviewed - no change compared to H&P  ________________________________________________________________________  Care Plan discussed with:    Comments   Patient x    Family      RN x    Care Manager     Consultant                        Multidiciplinary team rounds were held today with case manager, nursing, pharmacist and Higher education careers adviser.  Patient's plan of care was discussed; medications were reviewed and discharge planning was addressed.     ________________________________________________________________________  Total NON critical  care TIME:  52  Minutes    Total CRITICAL CARE TIME Spent:   Minutes non procedure based      Comments   >50% of visit spent in counseling and coordination of care     ________________________________________________________________________  Consepcion Hearing, MD     Procedures: see electronic medical records for all procedures/Xrays and details which were not copied into  this note but were reviewed prior to creation of Plan.      LABS:  I reviewed today's most current labs and imaging studies.  Pertinent labs include:  Recent Labs     07/01/22  0440   WBC 13.6*   HGB 8.4*   HCT 27.5*   PLT 208       Recent Labs     06/30/22  1251 07/01/22  0440 07/02/22  0640   NA 135*  --  136   K 4.5  --  4.2   CL 100  --  104   CO2 27  --  24   GLUCOSE 149*  --  114*   BUN 69*  --  50*   CREATININE 4.76*  --  4.02*   CALCIUM 8.2*  --  7.7*   MG  --  2.5*  --    PHOS  --  5.6*  --    LABALBU 1.7* 1.6*  --    BILITOT 1.2* 1.1*  --    AST 37 39*  --    ALT 19 21  --          Signed: Consepcion Hearing, MD

## 2022-07-02 NOTE — Progress Notes (Signed)
NAME: Jaime [name]        DOB:  1946-10-03        MRN:  [id number]                     Assessment   :                                               Plan:  AKI  Pancreatitis  Hyponatremia  DM  anemia KRT initiated 7/31; now on MWF schedule; perm cath 8/30;  no HD today    Needs outpatient HD placement under AKI diagnosis - CM working on options (here or back in [place])    Watch for renal recovery, but none so far.    H/H not at goal.  Continue ESA.              Subjective:     Chief Complaint:   No complaints. We discussed the above            Review of Systems:    Symptom Y/N Comments  Symptom Y/N Comments   Fever/Chills    Chest Pain     Poor Appetite    Edema     Cough    Abdominal Pain     Sputum    Joint Pain     SOB/DOE    Pruritis/Rash     Nausea/vomit    Tolerating PT/OT     Diarrhea    Tolerating Diet     Constipation    Other       Could not obtain due to:      Objective:     VITALS:   Last 24hrs VS reviewed since prior progress note. Most recent are:  Vitals:    07/02/22 0715   BP: 130/61   Pulse: 94   Resp: 20   Temp: 98.4 F (36.9 C)   SpO2: 95%       Intake/Output Summary (Last 24 hours) at 07/02/2022 1036  Last data filed at 07/01/2022 1945  Gross per 24 hour   Intake 500 ml   Output 2500 ml   Net -2000 ml        Telemetry Reviewed:     PHYSICAL EXAM:  General: NAD  1+ edema      Lab Data Reviewed: (see below)    Medications Reviewed: (see below)    PMH/SH reviewed - no change compared to H&P  ________________________________________________________________________  Care Plan discussed with:  Patient     Family      RN     Care Manager                    Consultant:          Comments   >50% of visit spent in counseling and coordination of care       ________________________________________________________________________  Tanna Savoy, MD     Procedures: see electronic medical records for all procedures/Xrays and details which  were not  copied into this note but were reviewed prior to creation of Plan.      LABS:  Recent Labs     07/01/22  0440   WBC 13.6*   HGB 8.4*   HCT 27.5*   PLT 208       Recent Labs     06/30/22  1251 07/01/22  0440 07/02/22  0640   NA 135*  --  136   K 4.5  --  4.2   CL 100  --  104   CO2 27  --  24   BUN 69*  --  50*   MG  --  2.5*  --    PHOS  --  5.6*  --        Recent Labs     06/30/22  1251 07/01/22  0440   GLOB 5.2* 5.0*       No results for input(s): INR, APTT in the last 72 hours.    Invalid input(s): PTP   No results for input(s): TIBC, FERR in the last 72 hours.    Invalid input(s): FE, PSAT   No results found for: FOL, RBCF   No results for input(s): PH, PCO2, PO2 in the last 72 hours.  No results for input(s): CPK, CKMB, TROPONINI in the last 72 hours.  No components found for: [facility]  @labua @    MEDICATIONS:  Current Facility-Administered Medications   Medication Dose Route Frequency    heparin (porcine) injection 5,000 Units  5,000 Units SubCUTAneous 3 times per day    cefTRIAXone (ROCEPHIN) 2,000 mg in sodium chloride 0.9 % 50 mL IVPB (mini-bag)  2,000 mg IntraVENous Q24H    metronidazole (FLAGYL) 500 mg in 0.9% NaCl 100 mL IVPB premix  500 mg IntraVENous q8h    therapeutic multivitamin-minerals 1 tablet  1 tablet Per NG tube Daily    amiodarone (CORDARONE) tablet 200 mg  200 mg Per NG tube Daily    acidophilus probiotic capsule 1 capsule  1 capsule Oral Daily    lidocaine 2 % injection 20 mL  20 mL IntraDERmal Once    collagenase ointment   Topical Daily    epoetin alfa-epbx (RETACRIT) injection 6,000 Units  6,000 Units SubCUTAneous Once per day on Mon Wed Fri    fentaNYL (SUBLIMAZE) injection 25 mcg  25 mcg IntraVENous Q1H PRN    oxyCODONE (ROXICODONE) immediate release tablet 5 mg  5 mg Oral Q4H PRN    midodrine (PROAMATINE) tablet 5 mg  5 mg Oral Daily PRN    insulin lispro (HUMALOG) injection vial 0-4 Units  0-4 Units SubCUTAneous 4 times per day    bisacodyl (DULCOLAX) suppository 10 mg  10 mg Rectal  Daily PRN    albumin human 25% IV solution 25 g  25 g IntraVENous PRN    heparin (porcine) injection 1,200 Units  1,200 Units IntraCATHeter PRN    And    heparin (porcine) injection 1,000 Units  1,000 Units IntraCATHeter PRN    sodium chloride 0.9 % bolus 100 mL  100 mL IntraVENous PRN    balsum peru-castor oil (VENELEX) ointment   Topical BID    glucose chewable tablet 16 g  4 tablet Oral PRN    dextrose bolus 10% 125 mL  125 mL IntraVENous PRN    Or    dextrose bolus 10% 250 mL  250 mL IntraVENous PRN    glucagon (rDNA) injection 1 mg  1 mg SubCUTAneous PRN    dextrose 10 % infusion   IntraVENous Continuous PRN    sodium chloride flush 0.9 % injection 5-40 mL  5-40 mL IntraVENous 2 times per day    sodium chloride flush 0.9 % injection 5-40 mL  5-40 mL IntraVENous PRN    0.9 % sodium chloride infusion   IntraVENous PRN    acetaminophen (TYLENOL) tablet 650 mg  650 mg Oral Q6H PRN    Or    acetaminophen (TYLENOL) suppository 650 mg  650 mg Rectal Q6H PRN    ondansetron (ZOFRAN) injection 4 mg  4 mg IntraVENous Q6H PRN

## 2022-07-02 NOTE — Plan of Care (Signed)
Problem: Occupational Therapy - Adult  Goal: By Discharge: Performs self-care activities at highest level of function for planned discharge setting.  See evaluation for individualized goals.  Description: FUNCTIONAL STATUS PRIOR TO ADMISSION:     ADL Assistance: Independent, Ambulation Assistance: Independent, Transfer Assistance: Independent, Active Driver: Yes     HOME SUPPORT: Patient lived with spouse and was independent.    Occupational Therapy Goals:  Initiated 06/10/2022; Re-Evaluation 06/20/2022  Weekly Reassessment 06/29/22 - Continue as below. Goal #6 added.  1.  Patient will perform grooming at bed level with Moderate Assist within 7 day(s).  2.  Patient will perform self-feeding with Moderate Assist within 7 day(s).  3.  Patient will perform upper body dressing with Maximal Assist within 7 day(s).  4.  Patient will complete supine>sit with mod assist x2  within 7 day(s).  5.  Patient will tolerate sitting EOB with fair sitting balance in preparation for ADLs within 7 day(s).  6.  Patient will participate in BUE therapeutic activity / exercise with Minimal Assist within 7 day(s).      Outcome: Progressing   OCCUPATIONAL THERAPY TREATMENT  Patient: Jaime [name] (76 y.o. male)  Date: 07/02/2022  Primary Diagnosis: Septicemia (HCC) [A41.9]  Gastric perforation (HCC) [K25.5]  Perforated abdominal viscus [R19.8]  Procedure(s) (LRB):  LAPAROTOMY EXPLORATORY (N/A)  LAPAROSCOPY DIAGNOSTIC (N/A)  ENDOSCOPY OF ILEAL CONDUIT (N/A) 31 Days Post-Op   Precautions: Modified Diet, General Precautions, Contact Precautions, Fall Risk                Chart, occupational therapy assessment, plan of care, and goals were reviewed.    ASSESSMENT  Patient continues to benefit from skilled OT services and is progressing towards goals. Good participation with splinter skills B UEs needed for UE ADL participation; note diet has been upgraded from NPO- soft small bites and thin liquids; Poor functional use B UEs: R hand stronger than  L but proximal R UE weaker than proximal L UE; recommend assist with feeding at this time to maximize PO and potential for consistent PO tolerance.             PLAN :  Patient continues to benefit from skilled intervention to address the above impairments.  Continue treatment per established plan of care to address goals.    Recommend with staff: assist with PO 1:1 due to UE weakness    Recommend next OT session: meal set up/assessment, built up utensils PRN; standardized cog screen    Recommendation for discharge: (in order for the patient to meet his/her long term goals): Therapy 3 hours/day 5-7 days/week    Other factors to consider for discharge: patient's current support system is unable to meet their requirements for physical assistance    IF patient discharges home will need the following DME: bedside commode, hospital bed, mechanical lift, transfer bench, and wheelchair 18 inch       SUBJECTIVE:   Patient stated "I will do these movements- anything to get stronger."    OBJECTIVE DATA SUMMARY:   Cognitive/Behavioral Status:  Orientation  Overall Orientation Status: Within Functional Limits  Orientation Level: Oriented X4  Cognition  Overall Cognitive Status: WFL  Arousal/Alertness: Appropriate responses to stimuli  Following Commands: Follows one step commands consistently;Follows one step commands with increased time;Follows multistep commands with repitition;Follows multistep commands with increased time  Attention Span: Attends with cues to redirect;Difficulty dividing attention  Memory: Decreased recall of precautions;Decreased short term memory  Problem Solving: Decreased awareness of errors  Insights:  Decreased awareness of deficits  Initiation: Requires cues for some  Sequencing: Requires cues for some  Cognition Comment: recommend cog screen    Functional Mobility and Transfers for ADLs:  Bed Mobility:  Bed Mobility Training  Supine to Sit: Maximum assistance  Sit to Supine: Maximum assistance            ADL Intervention:         Feeding: Dependent/Total;Maximum assistance;Verbal cueing;Bringing food to mouth assist;Increased time to complete;Scoop assist   Feeding Skilled Clinical Factors: soft bite size, thin liquid                              UE Dressing: Dependent/Total  UE Dressing Skilled Clinical Factors: 6 pack hand exercise trained in prep for increased edema management  and AROM/strength and coordination needed for UE ADLs; completed x 2; trained SROM eg end range intrinsic and extrinsic deficits     ]    Pain Rating:  0/10   Pain Intervention(s):   pain is at a level acceptable to the patient      Activity Tolerance:   Fair  and requires rest breaks  Please refer to the flowsheet for vital signs taken during this treatment.    After treatment:   Patient left in no apparent distress in bed, Call bell within reach, Bed/ chair alarm activated, Caregiver / family present, Side rails x3, and Heels elevated for pressure relief    COMMUNICATION/EDUCATION:   The patient's plan of care was discussed with: physical therapist and registered nurse    Patient Education  Education Given To: Patient  Education Provided: Precautions;Energy Conservation;ADL Adaptive Strategies;Orientation  Education Provided Comments: 6 pack hand exercises benefit to ADLs and eventual mobility  Education Method: Verbal;Demonstration;Teach Back  Barriers to Learning: Cognition  Education Outcome: Verbalized understanding;Demonstrated understanding;Continued education needed    Thank you for this referral.  Willow Ora, OTR/L  Minutes: 15

## 2022-07-02 NOTE — Plan of Care (Signed)
Problem: Safety - Adult  Goal: Free from fall injury  07/02/2022 1414 by Henrietta Dine, RN  Outcome: Progressing  07/02/2022 0520 by Julio Alm, RN  Outcome: Progressing  Flowsheets (Taken 07/01/2022 1602 by Raleigh Nation, RN)  Free From Fall Injury:   Instruct family/caregiver on patient safety   Based on caregiver fall risk screen, instruct family/caregiver to ask for assistance with transferring infant if caregiver noted to have fall risk factors     Problem: Respiratory - Adult  Goal: Achieves optimal ventilation and oxygenation  07/02/2022 1414 by Henrietta Dine, RN  Outcome: Progressing  07/02/2022 0520 by Julio Alm, RN  Outcome: Progressing  [place] (Taken 07/01/2022 1840 by Raleigh Nation, RN)  Achieves optimal ventilation and oxygenation:   Assess for changes in respiratory status   Assess for changes in mentation and behavior   Position to facilitate oxygenation and minimize respiratory effort   Oxygen supplementation based on oxygen saturation or arterial blood gases     Problem: Pain  Goal: Verbalizes/displays adequate comfort level or baseline comfort level  07/02/2022 1414 by Henrietta Dine, RN  Outcome: Progressing  07/02/2022 0520 by Julio Alm, RN  Outcome: Progressing     Problem: Discharge Planning  Goal: Discharge to home or other facility with appropriate resources  07/02/2022 1414 by Henrietta Dine, RN  Outcome: Progressing  07/02/2022 0520 by Julio Alm, RN  Outcome: Progressing  Flowsheets (Taken 07/01/2022 1840 by Raleigh Nation, RN)  Discharge to home or other facility with appropriate resources: Identify barriers to discharge with patient and caregiver     Problem: Chronic Conditions and Co-morbidities  Goal: Patient's chronic conditions and co-morbidity symptoms are monitored and maintained or improved  07/02/2022 1414 by Henrietta Dine, RN  Outcome: Progressing  07/02/2022 0520 by Julio Alm, RN  Outcome: Progressing  Flowsheets (Taken 07/01/2022 1840 by Raleigh Nation, RN)  Care Plan  - Patient's Chronic Conditions and Co-Morbidity Symptoms are Monitored and Maintained or Improved:   Monitor and assess patient's chronic conditions and comorbid symptoms for stability, deterioration, or improvement   Update acute care plan with appropriate goals if chronic or comorbid symptoms are exacerbated and prevent overall improvement and discharge     Problem: Neurosensory - Adult  Goal: Achieves maximal functionality and self care  Outcome: Progressing     Problem: Cardiovascular - Adult  Goal: Maintains optimal cardiac output and hemodynamic stability  Outcome: Progressing  Goal: Absence of cardiac dysrhythmias or at baseline  Outcome: Progressing     Problem: Gastrointestinal - Adult  Goal: Maintains or returns to baseline bowel function  Outcome: Progressing  Goal: Maintains adequate nutritional intake  Outcome: Progressing     Problem: Genitourinary - Adult  Goal: Absence of urinary retention  Outcome: Progressing     Problem: Metabolic/Fluid and Electrolytes - Adult  Goal: Electrolytes maintained within normal limits  Outcome: Progressing  Goal: Hemodynamic stability and optimal renal function maintained  Outcome: Progressing  Goal: Glucose maintained within prescribed range  Outcome: Progressing     Problem: Skin/Tissue Integrity - Adult  Goal: Skin integrity remains intact  Outcome: Progressing  Goal: Incisions, wounds, or drain sites healing without S/S of infection  Outcome: Progressing  Goal: Oral mucous membranes remain intact  Outcome: Progressing     Problem: Hematologic - Adult  Goal: Maintains hematologic stability  Outcome: Progressing     Problem: Musculoskeletal - Adult  Goal: Return mobility to safest level of function  Outcome: Progressing  Goal: Maintain proper alignment of affected body part  Outcome: Progressing  Goal: Return ADL status to a safe level of function  Outcome: Progressing     Problem: Skin/Tissue Integrity  Goal: Absence of new skin breakdown  Description: 1.  Monitor  for areas of redness and/or skin breakdown  2.  Assess vascular access sites hourly  3.  Every 4-6 hours minimum:  Change oxygen saturation probe site  4.  Every 4-6 hours:  If on nasal continuous positive airway pressure, respiratory therapy assess nares and determine need for appliance change or resting period.  Outcome: Progressing     Problem: ABCDS Injury Assessment  Goal: Absence of physical injury  Outcome: Progressing     Problem: SLP Adult - Impaired Swallowing  Goal: By Discharge: Advance to least restrictive diet without signs or symptoms of aspiration for planned discharge setting.  See evaluation for individualized goals.  Description: Speech Pathology Goals  1. Patient will participate in FEES to objectively assess swallow function prior to PO diet initiation. Goal initiated 06/22/22. Goal met 06/22/22.   2. Patient will tolerate therapeutic PO trials of thin liquids (e.g., ice chips, small sips of water) and purees without clinical s/s of aspiration or respiratory decline. Goal initiated 06/22/22. MET 06/30/2022  3. Patient will complete pharyngeal strengthening exercises (e.g., effortful swallow) with min cues for technique. Goal initiated 06/22/22.   4. Patient will complete repeat instrumental swallow study to determine readiness for PO diet when clinically appropriate. Goal initiated 06/22/22. MET 06/26/22.    5. Patient will tolerate least restrictive diet with no overt s/s aspiration within 7 days. Initiated 06/26/22. Continued 06/30/2022  07/02/2022 1329 by Etta Quill, SLP  Outcome: Progressing     Problem: Safety - Medical Restraint  Goal: Remains free of injury from restraints (Restraint for Interference with Medical Device)  Description: INTERVENTIONS:  1. Determine that other, less restrictive measures have been tried or would not be effective before applying the restraint  2. Evaluate the patient's condition at the time of restraint application  3. Inform patient/family regarding the reason  for restraint  4. Q2H: Monitor safety, psychosocial status, comfort, nutrition and hydration  Outcome: Progressing

## 2022-07-02 NOTE — Care Coordination-Inpatient (Signed)
CM note:  Updated Medicals faxed to [facility] [phone] to get scanned into system- also will fax medicals to [facility] [place] at [phone]- CM left VM for April [facility] ph# [phone]  transfer v coordinator to inquire about further assistance with placement options.  Awaiting call back at this time.  Ella Jubilee, MSW

## 2022-07-02 NOTE — Care Coordination-Inpatient (Addendum)
Transition of Care Plan:     RUR: 19% (moderate RUR)  Prior Level of Functioning:  Independent - lives with wife in [place].  Disposition: [facility] ([place] [place] - completed [employer] Forms for financial eligibility faxed to the [place] in [place] and [place] vs. Placement in [place] area for SNF/LTAC  If SNF or IPR: Date FOC offered: 06/30/2022 SNF  Date FOC received: Agreeable to [facility]  Accepting facility: TBD  Date authorization started with reference number: TBD  Date authorization received and expires: TBD  Follow up appointments: PCP/Specialist  DME needed: TBD  Transportation at discharge: [facility] anticipated  IM/IMM Medicare/Tricare letter given: 2nd IM needed prior to discharge.  Is patient a Veteran and connected with VA? yes              If yes, was [employer] transfer form completed and VA notified? No.  Caregiver Contact: Anvith Mauriello - Wife - [phone]  Discharge Caregiver contacted prior to discharge? Patient to contact.  Care Conference needed? No.  Barriers to discharge: nephrology/new HD?, podiatry/cards clearance, surgical/ID clearance, wound vac, placement    0911 - [facility] in [place] cannot provide HD to patient if agreeable to go to their SNF unless they have a diagnosis of ESRD.  An alternate HD center would need to be set up and patient would need to finance transportation between facility and HD.  It will need to be determined if HD is to be set up in [place] or [place].  Patient is not strong enough at this time to transport via car to return home.  Patient/family does not have the finances to transport patient home via ambulance.  [facility] [place] [facility] been sent Means Test Forms to assistance with finances to have patient transported and admitted to their facility.  LTAC inquiry placed with [facility] in [place], [place] and [place], [place].    1343 - CM reached out to [employer] via [employer] to see if they can notify the [place],  location of  potential need for patient to transfer to their facility.           Ronnell Freshwater, BSN, RN     Care Management  [phone]

## 2022-07-02 NOTE — Plan of Care (Signed)
Problem: Physical Therapy - Adult  Goal: By Discharge: Performs mobility at highest level of function for planned discharge setting.  See evaluation for individualized goals.  Description: FUNCTIONAL STATUS PRIOR TO ADMISSION: Patient was independent and active without use of DME. Drove himself to [place] from [place] for NASCAR race. Denies home O2 use.     HOME SUPPORT PRIOR TO ADMISSION: The patient lived with wife however wife has her own medical issues and cannot provide physical assist.    Physical Therapy Goals  Re-Assessment 06/29/22  1.  Patient will move from supine to sit and sit to supine, scoot up and down, and roll side to side in bed with maximal assist of 1 within 7 day(s).   2. Patient will sit EOB x 15 minutes with contact guard assist within 7 days.    3.  Patient will perform sit to stand with maximal assistance x2 within 7 day(s).  4.  Patient will transfer from bed to chair and chair to bed via minimal lift equipment (best mover) within 7 day(s).    Reassessed 06/20/2022   1.  Patient will move from supine to sit and sit to supine, scoot up and down, and roll side to side in bed with maximal assist of 1 within 7 day(s).   2. Patient will sit EOB x 5 minutes with contact guard assist within 7 days.    3.  Patient will perform sit to stand with maximal assistance x2 within 7 day(s).  4.  Patient will transfer from bed to chair and chair to bed via minimal lift equipment (best mover) within 7 day(s).    Initiated 06/10/2022  1.  Patient will move from supine to sit and sit to supine, scoot up and down, and roll side to side in bed with contact guard assist within 7 day(s).   2. Patient will sit EOB x 10 minutes with supervision/set-up assist within 7 days.    2.  Patient will perform sit to stand with moderate assistance x2 within 7 day(s).  3.  Patient will transfer from bed to chair and chair to bed with moderate assistance x2 using the least restrictive device within 7 day(s).  4.   Patient will ambulate with moderate assistance x2 for 5 feet with the least restrictive device within 7 day(s).   Outcome: Progressing   PHYSICAL THERAPY TREATMENT    Patient: Jaime [name] (76 y.o. male)  Date: 07/02/2022  Diagnosis: Septicemia (HCC) [A41.9]  Gastric perforation (HCC) [K25.5]  Perforated abdominal viscus [R19.8] Gastric perforation (HCC)  Procedure(s) (LRB):  LAPAROTOMY EXPLORATORY (N/A)  LAPAROSCOPY DIAGNOSTIC (N/A)  ENDOSCOPY OF ILEAL CONDUIT (N/A) 31 Days Post-Op  Precautions: Fall Risk                    ASSESSMENT:  Patient continues to benefit from skilled PT services and is slowly progressing towards goals. Patient received in bed and agreeable to participate.  Good tolerance of strengthening exercises for lower extremities through all planes, able to increase reps to 10 of each.  Worked on trunk control and coming to long sit position with max assist.  Patient remains well below baseline and is markedly weak, will likely need a significant amount of therapy for recovery. Left in supine with call bell in reach.           PLAN:  Patient continues to benefit from skilled intervention to address the above impairments.  Continue treatment per established plan of care.  Recommendation for discharge: (in order for the patient to meet his/her long term goals): Therapy 3 hours/day 5-7 days/week    Other factors to consider for discharge: patient's current support system is unable to meet their requirements for physical assistance, high risk for falls, not safe to be alone, and concern for safely navigating or managing the home environment    IF patient discharges home will need the following DME: continuing to assess with progress       SUBJECTIVE:   Patient stated, "I really appreciate everyone here but I wish I was closer to home.."    OBJECTIVE DATA SUMMARY:   Critical Behavior:          Functional Mobility Training:  Bed Mobility:  Bed Mobility Training  Supine to Sit: Maximum assistance  Sit  to Supine: Maximum assistance  Transfers:     Balance:      Ambulation/Gait Training:        Neuro Re-Education:                      Activity Tolerance:   requires rest breaks    After treatment:   Patient left in no apparent distress in bed, Call bell within reach, and Side rails x3      COMMUNICATION/EDUCATION:   The patient's plan of care was discussed with: occupational therapist and registered nurse    Patient Education  Education Provided: Home Exercise Program  Education Method: Verbal;Demonstration  Barriers to Learning: None  Education Outcome: Verbalized understanding      Rica Koyanagi, PT  Minutes: 24

## 2022-07-02 NOTE — Plan of Care (Signed)
Speech LAnguage Pathology TREATMENT    Patient: Jaime [name] (76 y.o. male)  Date: 07/02/2022  Primary Diagnosis: Septicemia ([place]) [A41.9]  Gastric perforation ([place]) [K25.5]  Perforated abdominal viscus [R19.8]  Procedure(s) (LRB):  LAPAROTOMY EXPLORATORY (N/A)  LAPAROSCOPY DIAGNOSTIC (N/A)  ENDOSCOPY OF ILEAL CONDUIT (N/A) 31 Days Post-Op   Precautions:   Fall Risk                  ASSESSMENT :  Based on the objective data described below, the patient presents with limited intake for soft solids. Noted prolonged oral manipulation with delayed oral clearance of solids. This required numerous liquid washes to clear. Intermittent coughing noted throughout meal, though note coughing appreciated on FEES in absence of aspiration. It is reasonable to continue current PO diet, using other indicators of infection as measures of diet tolerance (fever, congestion, elevated WBC, findings on chest imaging) rather than cough alone.     Patient will benefit from skilled intervention to address the above impairments.     PLAN :  Recommendations and Planned Interventions:  Diet: Soft and bite sized and thin liquids  Single sips     Acute SLP Services: Yes, SLP will continue to follow per plan of care.    Discharge Recommendations: Continue to assess pending progress     SUBJECTIVE:   Patient stated, "Do I stay here until I can walk out of here?."    OBJECTIVE:   History reviewed. No pertinent past medical history.  Past Surgical History:   Procedure Laterality Date    BLADDER SURGERY N/A 06/01/2022    ENDOSCOPY OF ILEAL CONDUIT performed by Melene Plan, MD at [facility]    CT VISCERAL PERCUTANEOUS DRAIN  06/05/2022    CT VISCERAL PERCUTANEOUS DRAIN 06/05/2022 [facility] RAD CT    IR TUNNELED CATHETER PLACEMENT GREATER THAN 5 YEARS  07/01/2022    IR TUNNELED CATHETER PLACEMENT GREATER THAN 5 YEARS 07/01/2022 [facility], APRN - NP [facility] ANGIO IR    LAPAROSCOPY N/A 06/01/2022    LAPAROSCOPY DIAGNOSTIC performed by Melene Plan, MD at  [place] N/A 06/01/2022    LAPAROTOMY EXPLORATORY performed by Melene Plan, MD at [facility]     Prior Level of Function/Home Situation:   Social/Functional History  Lives With: Spouse  Type of Home: House  Home Layout: One level  Home Equipment: None  ADL Assistance: Independent  Ambulation Assistance: Independent  Transfer Assistance: Independent  Active Driver: Yes    Baseline Assessment:                Cognitive and Communication Status:  Neurologic State: Alert    Cognition: Follows commands    Dysphagia:  Oral Assessment:     P.O. Trials:  PO Trials  Assessment Method(s): Observation  Patient Position: up in bed  Vocal Quality: No Impairment  Consistency Presented: Thin;Soft & Bite Sized  How Presented: SLP-fed/Presented;Spoon;Straw  Bolus Acceptance: No impairment  Bolus Formation/Control: Impaired  Type of Impairment: Delayed  Propulsion: Delayed (# of seconds)  Aspiration Signs/Symptoms: Strong cough         Respiratory Status/Airway:  Room air                                 After treatment:   Patient left in no apparent distress in bed and Call bell left within reach    COMMUNICATION/EDUCATION:  Patient was educated regarding purpose of SLP visit. He participated.     The patient's plan of care including recommendations, planned interventions, and recommended diet changes were discussed with: Registered nurse    Patient/family have participated as able in goal setting and plan of care    Thank you,  Etta Quill, SLP  Minutes: 20   Problem: SLP Adult - Impaired Swallowing  Goal: By Discharge: Advance to least restrictive diet without signs or symptoms of aspiration for planned discharge setting.  See evaluation for individualized goals.  Description: Speech Pathology Goals  1. Patient will participate in FEES to objectively assess swallow function prior to PO diet initiation. Goal initiated 06/22/22. Goal met 06/22/22.   2. Patient will tolerate therapeutic PO trials of thin  liquids (e.g., ice chips, small sips of water) and purees without clinical s/s of aspiration or respiratory decline. Goal initiated 06/22/22. MET 06/30/2022  3. Patient will complete pharyngeal strengthening exercises (e.g., effortful swallow) with min cues for technique. Goal initiated 06/22/22.   4. Patient will complete repeat instrumental swallow study to determine readiness for PO diet when clinically appropriate. Goal initiated 06/22/22. MET 06/26/22.    5. Patient will tolerate least restrictive diet with no overt s/s aspiration within 7 days. Initiated 06/26/22. Continued 06/30/2022  Outcome: Progressing

## 2022-07-02 NOTE — Progress Notes (Signed)
Bedside shift report received from [place], [place]. Report included the following information SBAR, Kardex, MAR, and Recent Results. This RN verbalized understanding of plan of care with opportunity for clarification and questions. Care of patient assumed at this time. Dual skin check performed at this time.     Bedside shift report given to  Rhae Hammock, Charity fundraiser. Report included the following information SBAR, Kardex, MAR, and Recent Results. Oncoming RN verbalized understanding of plan of care with opportunity for clarification and questions. Dual skin check performed at this time.

## 2022-07-02 NOTE — Plan of Care (Signed)
Problem: Safety - Adult  Goal: Free from fall injury  Outcome: Progressing  Flowsheets (Taken 07/01/2022 1602 by Soyla [place], RN)  Free From Fall Injury:   Instruct family/caregiver on patient safety   Based on caregiver fall risk screen, instruct family/caregiver to ask for assistance with transferring infant if caregiver noted to have fall risk factors     Problem: Respiratory - Adult  Goal: Achieves optimal ventilation and oxygenation  Outcome: Progressing  Flowsheets (Taken 07/01/2022 1840 by Soyla [place], RN)  Achieves optimal ventilation and oxygenation:   Assess for changes in respiratory status   Assess for changes in mentation and behavior   Position to facilitate oxygenation and minimize respiratory effort   Oxygen supplementation based on oxygen saturation or arterial blood gases     Problem: Pain  Goal: Verbalizes/displays adequate comfort level or baseline comfort level  Outcome: Progressing     Problem: Discharge Planning  Goal: Discharge to home or other facility with appropriate resources  Outcome: Progressing  Flowsheets (Taken 07/01/2022 1840 by Soyla Murphy, RN)  Discharge to home or other facility with appropriate resources: Identify barriers to discharge with patient and caregiver     Problem: Chronic Conditions and Co-morbidities  Goal: Patient's chronic conditions and co-morbidity symptoms are monitored and maintained or improved  Outcome: Progressing  Flowsheets (Taken 07/01/2022 1840 by Soyla [place], RN)  Care Plan - Patient's Chronic Conditions and Co-Morbidity Symptoms are Monitored and Maintained or Improved:   Monitor and assess patient's chronic conditions and comorbid symptoms for stability, deterioration, or improvement   Update acute care plan with appropriate goals if chronic or comorbid symptoms are exacerbated and prevent overall improvement and discharge

## 2022-07-02 NOTE — Progress Notes (Signed)
Comprehensive Nutrition Assessment    Type and Reason for Visit:  Reassess    Nutrition Recommendations/Plan:   Continue current diet, consistency adjustments per SLP  Nepro shakes TID  Please document % meals and supplements consumed in flowsheet I/O's under intake      Malnutrition Assessment:  Malnutrition Status:  Insufficient data (06/04/22 1319)        Nutrition Assessment:     Chart reviewed and case discussed during IDR. Pt working with SLP at time of RD visit. He reports not eating much recently, but has been drinking some Ensure. Will adjust supplement to Nepro for better CHO control and higher kcal with low meal intake. Continue to encourage intake of meals and supplements.     Patient Vitals for the past 120 hrs:   PO Meals Eaten (%)   06/30/22 1830 26 - 50%   06/28/22 1917 26 - 50%   06/28/22 1516 26 - 50%   06/28/22 0910 0%     Wt Readings from Last 5 Encounters:   06/22/22 106.5 kg (234 lb 12.6 oz)   ]    Nutrition Related Findings:    Labs: Cr 4.02.   Meds: probiotic, rocephin, humalog, flagyl, MVI.   Edema: pitting generalized, 2+pitting RUE, 1+/2+ pitting LUE, 1+/2+ BLE, 2+ perineal.   BM 8/28.   Wound Type: Surgical Incision, Wound Vac       Current Nutrition Intake & Therapies:    Average Meal Intake: 26-50%  Average Supplements Intake: 51-75%  ADULT DIET; Dysphagia - Soft and Bite Sized; 5 carb choices (75 gm/meal)  ADULT ORAL NUTRITION SUPPLEMENT; Breakfast, Lunch, Dinner; Renal Oral Supplement    Anthropometric Measures:  Height: 172.7 cm (5' 8")  Ideal Body Weight (IBW): 154 lbs (70 kg)       Current Body Weight: 234 lb 12.6 oz (106.5 kg), 154.2 % IBW. Weight Source: Not Specified  Current BMI (kg/m2): 35.7                          BMI Categories: Obese Class 2 (BMI 35.0 -39.9) (not reliable due to anasarca)    Estimated Daily Nutrient Needs:  Energy Requirements Based On: Formula  Weight Used for Energy Requirements: Current  Energy (kcal/day): MSJ 2150 (1795 x 1.2)  Weight Used for Protein  Requirements: Ideal  Protein (g/day): 119-140g (1.7-2gPro/kg IBW)  Method Used for Fluid Requirements: Other (Comment)  Fluid (ml/day): per MD    Nutrition Diagnosis:   Inadequate protein-energy intake related to impaired respiratory function as evidenced by NPO or clear liquid status due to medical condition  Dx improving. Diet advanced, but PO intake is low.     Nutrition Interventions:   Food and/or Nutrient Delivery: Continue Current Diet, Continue Oral Nutrition Supplement  Nutrition Education/Counseling: No recommendation at this time  Coordination of Nutrition Care: Continue to monitor while inpatient, Interdisciplinary Rounds       Goals:  Previous Goal Met: Goal(s) Achieved  Goals: PO intake 50% or greater, by next RD assessment       Nutrition Monitoring and Evaluation:   Behavioral-Environmental Outcomes: None Identified  Food/Nutrient Intake Outcomes: Food and Nutrient Intake, Supplement Intake  Physical Signs/Symptoms Outcomes: Biochemical Data, Nutrition Focused Physical Findings, Skin, Weight, GI Status, Hemodynamic Status, Fluid Status or Edema    Discharge Planning:    Too soon to determine     Kristin Bruins, RD  Contact: [phone]

## 2022-07-03 LAB — BASIC METABOLIC PANEL
Anion Gap: 7 mmol/L (ref 5–15)
BUN: 68 MG/DL — ABNORMAL HIGH (ref 6–20)
Bun/Cre Ratio: 13 (ref 12–20)
CO2: 27 mmol/L (ref 21–32)
Calcium: 8.3 MG/DL — ABNORMAL LOW (ref 8.5–10.1)
Chloride: 102 mmol/L (ref 97–108)
Creatinine: 5.29 MG/DL — ABNORMAL HIGH (ref 0.70–1.30)
Est, Glom Filt Rate: 11 mL/min/{1.73_m2} — ABNORMAL LOW (ref >60)
Glucose: 129 mg/dL — ABNORMAL HIGH (ref 65–100)
Potassium: 4.4 mmol/L (ref 3.5–5.1)
Sodium: 136 mmol/L (ref 136–145)

## 2022-07-03 LAB — CBC
Hematocrit: 24.5 % — ABNORMAL LOW (ref 36.6–50.3)
Hemoglobin: 7.6 g/dL — ABNORMAL LOW (ref 12.1–17.0)
MCH: 31.9 PG (ref 26.0–34.0)
MCHC: 31 g/dL (ref 30.0–36.5)
MCV: 102.9 FL — ABNORMAL HIGH (ref 80.0–99.0)
MPV: 10.8 FL (ref 8.9–12.9)
Nucleated RBCs: 0 PER 100 WBC
Platelets: 189 10*3/uL (ref 150–400)
RBC: 2.38 M/uL — ABNORMAL LOW (ref 4.10–5.70)
RDW: 18.1 % — ABNORMAL HIGH (ref 11.5–14.5)
WBC: 11.6 10*3/uL — ABNORMAL HIGH (ref 4.1–11.1)
nRBC: 0 10*3/uL (ref 0.00–0.01)

## 2022-07-03 LAB — POCT GLUCOSE
POC Glucose: 192 mg/dL — ABNORMAL HIGH (ref 65–117)
POC Glucose: 124 mg/dL — ABNORMAL HIGH (ref 65–117)
POC Glucose: 117 mg/dL (ref 65–117)

## 2022-07-03 LAB — HEPATIC FUNCTION PANEL
ALT: 19 U/L (ref 12–78)
AST: 36 U/L (ref 15–37)
Albumin/Globulin Ratio: 0.4 — ABNORMAL LOW (ref 1.1–2.2)
Albumin: 1.8 g/dL — ABNORMAL LOW (ref 3.5–5.0)
Alk Phosphatase: 245 U/L — ABNORMAL HIGH (ref 45–117)
Bilirubin, Direct: 0.7 MG/DL — ABNORMAL HIGH (ref 0.0–0.2)
Globulin: 4.9 g/dL — ABNORMAL HIGH (ref 2.0–4.0)
Total Bilirubin: 1.1 MG/DL — ABNORMAL HIGH (ref 0.2–1.0)
Total Protein: 6.7 g/dL (ref 6.4–8.2)

## 2022-07-03 LAB — EXTRA TUBES HOLD

## 2022-07-03 LAB — MAGNESIUM: Magnesium: 2.5 mg/dL — ABNORMAL HIGH (ref 1.6–2.4)

## 2022-07-03 LAB — PHOSPHORUS: Phosphorus: 5.1 MG/DL — ABNORMAL HIGH (ref 2.6–4.7)

## 2022-07-03 MED FILL — METRONIDAZOLE 500 MG/100ML IV SOLN: 500 MG/100ML | INTRAVENOUS | Qty: 100

## 2022-07-03 MED FILL — HEPARIN SODIUM (PORCINE) 5000 UNIT/ML IJ SOLN: 5000 UNIT/ML | INTRAMUSCULAR | Qty: 1

## 2022-07-03 MED FILL — CEFTRIAXONE SODIUM 2 G IJ SOLR: 2 g | INTRAMUSCULAR | Qty: 2000

## 2022-07-03 MED FILL — AMIODARONE HCL 200 MG PO TABS: 200 MG | ORAL | Qty: 1

## 2022-07-03 MED FILL — RETACRIT 3000 UNIT/ML IJ SOLN: 3000 UNIT/ML | INTRAMUSCULAR | Qty: 2

## 2022-07-03 NOTE — Progress Notes (Signed)
Physical Therapy    Chart reviewed and attempted visit but patient is currently off the floor for HD.  Will defer and continue to follow.    Rica Koyanagi, MSPT

## 2022-07-03 NOTE — Plan of Care (Signed)
Problem: Discharge Planning  Goal: Discharge to home or other facility with appropriate resources  07/03/2022 0233 by Linward Foster, RN  Outcome: Progressing  Flowsheets (Taken 07/02/2022 1938)  Discharge to home or other facility with appropriate resources:   Identify barriers to discharge with patient and caregiver   Arrange for needed discharge resources and transportation as appropriate  07/02/2022 1414 by Henrietta Dine, RN  Outcome: Progressing     Problem: Chronic Conditions and Co-morbidities  Goal: Patient's chronic conditions and co-morbidity symptoms are monitored and maintained or improved  07/03/2022 0233 by Linward Foster, RN  Outcome: Progressing  Flowsheets (Taken 07/02/2022 1938)  Care Plan - Patient's Chronic Conditions and Co-Morbidity Symptoms are Monitored and Maintained or Improved: Monitor and assess patient's chronic conditions and comorbid symptoms for stability, deterioration, or improvement  07/02/2022 1414 by Henrietta Dine, RN  Outcome: Progressing     Problem: Cardiovascular - Adult  Goal: Maintains optimal cardiac output and hemodynamic stability  07/03/2022 0233 by Linward Foster, RN  Outcome: Progressing  Flowsheets (Taken 07/02/2022 1938)  Maintains optimal cardiac output and hemodynamic stability:   Monitor blood pressure and heart rate   Monitor urine output and notify Licensed Independent Practitioner for values outside of normal range   Assess for signs of decreased cardiac output  07/02/2022 1414 by Henrietta Dine, RN  Outcome: Progressing  Goal: Absence of cardiac dysrhythmias or at baseline  07/03/2022 0233 by Linward Foster, RN  Outcome: Progressing  Flowsheets (Taken 07/02/2022 1938)  Absence of cardiac dysrhythmias or at baseline:   Monitor cardiac rate and rhythm   Assess for signs of decreased cardiac output  07/02/2022 1414 by Henrietta Dine, RN  Outcome: Progressing     Problem: Gastrointestinal - Adult  Goal: Maintains or returns to baseline bowel function  07/03/2022 0233 by Linward Foster, RN  Outcome:  Progressing  Flowsheets (Taken 07/02/2022 1938)  Maintains or returns to baseline bowel function:   Assess bowel function   Encourage oral fluids to ensure adequate hydration   Administer IV fluids as ordered to ensure adequate hydration  07/02/2022 1414 by Henrietta Dine, RN  Outcome: Progressing  Goal: Maintains adequate nutritional intake  07/03/2022 0233 by Linward Foster, RN  Outcome: Progressing  Flowsheets (Taken 07/02/2022 1938)  Maintains adequate nutritional intake:   Monitor percentage of each meal consumed   Identify factors contributing to decreased intake, treat as appropriate   Assist with meals as needed  07/02/2022 1414 by Henrietta Dine, RN  Outcome: Progressing     Problem: Genitourinary - Adult  Goal: Absence of urinary retention  07/03/2022 0233 by Linward Foster, RN  Outcome: Progressing  07/02/2022 1414 by Henrietta Dine, RN  Outcome: Progressing     Problem: Metabolic/Fluid and Electrolytes - Adult  Goal: Electrolytes maintained within normal limits  07/03/2022 0233 by Linward Foster, RN  Outcome: Progressing  Flowsheets (Taken 07/02/2022 1938)  Electrolytes maintained within normal limits:   Monitor labs and assess patient for signs and symptoms of electrolyte imbalances   Administer electrolyte replacement as ordered   Monitor response to electrolyte replacements, including repeat lab results as appropriate   Fluid restriction as ordered   Instruct patient on fluid and nutrition restrictions as appropriate  07/02/2022 1414 by Henrietta Dine, RN  Outcome: Progressing  Goal: Hemodynamic stability and optimal renal function maintained  07/03/2022 0233 by Linward Foster, RN  Outcome: Progressing  Flowsheets (Taken 07/02/2022 1938)  Hemodynamic stability and optimal renal function maintained:   Monitor labs and assess for signs  and symptoms of volume excess or deficit   Monitor intake, output and patient weight   Monitor urine specific gravity, serum osmolarity and serum sodium as indicated or ordered   Monitor response to interventions for  patient's volume status, including labs, urine output, blood pressure (other measures as available)   Encourage oral intake as appropriate   Instruct patient on fluid and nutrition restrictions as appropriate  07/02/2022 1414 by Henrietta Dine, RN  Outcome: Progressing  Goal: Glucose maintained within prescribed range  07/03/2022 0233 by Linward Foster, RN  Outcome: Progressing  Flowsheets (Taken 07/02/2022 1938)  Glucose maintained within prescribed range:   Monitor blood glucose as ordered   Assess for signs and symptoms of hyperglycemia and hypoglycemia   Administer ordered medications to maintain glucose within target range   Assess barriers to adequate nutritional intake and initiate nutrition consult as needed   Instruct patient on self management of diabetes and initiate consult as needed  07/02/2022 1414 by Henrietta Dine, RN  Outcome: Progressing     Problem: Skin/Tissue Integrity - Adult  Goal: Skin integrity remains intact  07/03/2022 0233 by Linward Foster, RN  Outcome: Progressing  Flowsheets (Taken 07/02/2022 1938)  Skin Integrity Remains Intact:   Monitor for areas of redness and/or skin breakdown   Assess vascular access sites hourly   Every 4-6 hours minimum: Change oxygen saturation probe site   Every 4-6 hours: If on nasal continuous positive airway pressure, respiratory therapy assesses nares and determine need for appliance change or resting period  07/02/2022 1414 by Henrietta Dine, RN  Outcome: Progressing  Goal: Incisions, wounds, or drain sites healing without S/S of infection  07/03/2022 0233 by Linward Foster, RN  Outcome: Progressing  Flowsheets (Taken 07/02/2022 1938)  Incisions, Wounds, or Drain Sites Healing Without Sign and Symptoms of Infection: ADMISSION and DAILY: Assess and document risk factors for pressure ulcer development  07/02/2022 1414 by Henrietta Dine, RN  Outcome: Progressing  Goal: Oral mucous membranes remain intact  07/03/2022 0233 by Linward Foster, RN  Outcome: Progressing  Flowsheets (Taken 07/02/2022  1938)  Oral Mucous Membranes Remain Intact:   Assess oral mucosa and hygiene practices   Implement preventative oral hygiene regimen  07/02/2022 1414 by Henrietta Dine, RN  Outcome: Progressing     Problem: Physical Therapy - Adult  Goal: By Discharge: Performs mobility at highest level of function for planned discharge setting.  See evaluation for individualized goals.  Description: FUNCTIONAL STATUS PRIOR TO ADMISSION: Patient was independent and active without use of DME. Drove himself to [place] from [place] for [employer] race. Denies home O2 use.     HOME SUPPORT PRIOR TO ADMISSION: The patient lived with wife however wife has her own medical issues and cannot provide physical assist.    Physical Therapy Goals  Re-Assessment 06/29/22  1.  Patient will move from supine to sit and sit to supine, scoot up and down, and roll side to side in bed with maximal assist of 1 within 7 day(s).   2. Patient will sit EOB x 15 minutes with contact guard assist within 7 days.    3.  Patient will perform sit to stand with maximal assistance x2 within 7 day(s).  4.  Patient will transfer from bed to chair and chair to bed via minimal lift equipment (best mover) within 7 day(s).    Reassessed 06/20/2022   1.  Patient will move from supine to sit and sit to supine, scoot up and down, and roll  side to side in bed with maximal assist of 1 within 7 day(s).   2. Patient will sit EOB x 5 minutes with contact guard assist within 7 days.    3.  Patient will perform sit to stand with maximal assistance x2 within 7 day(s).  4.  Patient will transfer from bed to chair and chair to bed via minimal lift equipment (best mover) within 7 day(s).    Initiated 06/10/2022  1.  Patient will move from supine to sit and sit to supine, scoot up and down, and roll side to side in bed with contact guard assist within 7 day(s).   2. Patient will sit EOB x 10 minutes with supervision/set-up assist within 7 days.    2.  Patient will perform sit to stand with  moderate assistance x2 within 7 day(s).  3.  Patient will transfer from bed to chair and chair to bed with moderate assistance x2 using the least restrictive device within 7 day(s).  4.  Patient will ambulate with moderate assistance x2 for 5 feet with the least restrictive device within 7 day(s).   07/02/2022 1612 by Ovidio Kin, PT  Outcome: Progressing     Problem: Occupational Therapy - Adult  Goal: By Discharge: Performs self-care activities at highest level of function for planned discharge setting.  See evaluation for individualized goals.  Description: FUNCTIONAL STATUS PRIOR TO ADMISSION:     ADL Assistance: Independent, Ambulation Assistance: Independent, Transfer Assistance: Independent, Active Driver: Yes     HOME SUPPORT: Patient lived with spouse and was independent.    Occupational Therapy Goals:  Initiated 06/10/2022; Re-Evaluation 06/20/2022  Weekly Reassessment 06/29/22 - Continue as below. Goal #6 added.  1.  Patient will perform grooming at bed level with Moderate Assist within 7 day(s).  2.  Patient will perform self-feeding with Moderate Assist within 7 day(s).  3.  Patient will perform upper body dressing with Maximal Assist within 7 day(s).  4.  Patient will complete supine>sit with mod assist x2  within 7 day(s).  5.  Patient will tolerate sitting EOB with fair sitting balance in preparation for ADLs within 7 day(s).  6.  Patient will participate in BUE therapeutic activity / exercise with Minimal Assist within 7 day(s).      07/02/2022 1709 by [name], OT  Outcome: Progressing     Problem: SLP Adult - Impaired Swallowing  Goal: By Discharge: Advance to least restrictive diet without signs or symptoms of aspiration for planned discharge setting.  See evaluation for individualized goals.  Description: Speech Pathology Goals  1. Patient will participate in FEES to objectively assess swallow function prior to PO diet initiation. Goal initiated 06/22/22. Goal met 06/22/22.   2. Patient will  tolerate therapeutic PO trials of thin liquids (e.g., ice chips, small sips of water) and purees without clinical s/s of aspiration or respiratory decline. Goal initiated 06/22/22. MET 06/30/2022  3. Patient will complete pharyngeal strengthening exercises (e.g., effortful swallow) with min cues for technique. Goal initiated 06/22/22.   4. Patient will complete repeat instrumental swallow study to determine readiness for PO diet when clinically appropriate. Goal initiated 06/22/22. MET 06/26/22.    5. Patient will tolerate least restrictive diet with no overt s/s aspiration within 7 days. Initiated 06/26/22. Continued 06/30/2022  07/02/2022 1329 by Etta Quill, SLP  Outcome: Progressing

## 2022-07-03 NOTE — Other (Signed)
07/03/22 1212   Treatment   Time Off 1212   Treatment Goal removed   Observations & Evaluations   Level of Consciousness 0   Oriented X 4   Heart Rhythm Regular   Respiratory Quality/Effort Unlabored   O2 Device None (Room air)   Bilateral Breath Sounds Clear   RUE Edema +2;Pitting   LUE Edema +2;Pitting   RLE Edema Trace   LLE Edema Trace   Vital Signs   BP 111/61   Temp 97.6 F (36.4 C)   Pulse 90   Respirations 16   Pain Assessment   Pain Assessment None - Denies Pain   Tunneled Hemodialysis Catheter Right Subclavian   Placement Date: 07/01/22   Present on Admission/Arrival: No  Inserted by: Hope Riddell-NP  Insertion Practices: Chlorohexadine skin antisepsis;Maximal barrier precautions;Sterile ultrasound technique;Optimal catheter site selection;Hand hygiene  Orien...   Access Status  Not Accessed   Continued need for line? Yes   Site Assessment Clean, dry & intact   Venous Lumen Status Flushed;Heparin locked;Capped   Arterial Lumen Status Flushed;Heparin locked;Capped   Alcohol Cap Used No   Line Care Connections checked and tightened;Ports disinfected   Dressing Type Bacteriocidal;Transparent   Date of Last Dressing Change 07/03/22   Dressing Status New dressing applied;Clean, dry & intact   Dressing Intervention New;Dressing changed   Dressing Change Due 07/07/22   Treatment Initiation   Dialyze Hours 3   During Hemodialysis Assessment   Ultrafiltration Removed (mL) 2200 ml/min   Post-Hemodialysis Assessment   Post-Treatment Procedures Blood returned;Catheter capped, clamped and heparinized x 2 ports   Machine Disinfection Process Acid/Vinegar Clean;Heat Disinfect   Rinseback Volume (ml) 300 ml   Blood Volume Processed (Liters) 66.8 L   Dialyzer Clearance Lightly streaked   Duration of Treatment (minutes) 18 minutes   Hemodialysis Intake (ml) 600 ml   Hemodialysis Output (ml) 2200 ml   NET Removed (ml) 1600   Tolerated Treatment Good   Interventions Taken Fluid bolus;Ultrafiltration goal decreased    Physician Notified Yes   Patient Disposition Return to room     Primary RN SBAR: Jeanne Ivan, RN  Comments: mild asymptomatic hypotension during treatment. UF decreased. MD notified.  Patient responded well to interventions.

## 2022-07-03 NOTE — Wound Image (Signed)
Wound Care consult for reassessment and dressing change for the Wound Vac. Chart reviewed and patient assessed.  He got back from Dialysis today and was hungry but no food tastes good to him. He is drinking his protein supplements, Ensure clear.   He does not like coffee or milk and does not want them on his tray.   Pt. May go to a long term care facility next week. CM working on the authorization today.   Assessment: the wound is clean with serous drainage in the canister. The wound is granulating but it is pale and pink with slough in the middle of the wound. See flowsheet below for details (RED).      Negative Pressure Wound Therapy Abdomen (Active)   $ Standard NPWT <=50 sq cm PER TX $ Yes 06/30/22 1112   $ Standard NPWT >50 sq cm PER TX $ Yes 07/03/22 1519   Wound Type Surgical 07/03/22 1519   Unit Type ulta 06/30/22 1112   Dressing Type Black Foam;Other (Comment) 07/03/22 1519   Number of pieces used 1 07/03/22 1519   Number of pieces removed 2 06/25/22 1137   Cycle Continuous 07/03/22 1519   Target Pressure (mmHg) 125 07/03/22 1519   Irrigation Solution Vashe 06/27/22 1110   Instillation Volume  75 mL 06/27/22 1110   Soak Time  6 minutes 06/27/22 2041   Vac Frequency 4 hours 06/27/22 2041   Canister changed? No 07/03/22 1519   Dressing Status New dressing applied 07/03/22 1519   Dressing Changed Changed/New 07/03/22 1519   Drainage Amount None 07/03/22 0730   Drainage Description Serous 07/03/22 1519   Dressing Change Due 07/07/22 07/03/22 1519   Output (ml) 0 ml 07/03/22 0306   Wound Assessment Granulation tissue;Slough 07/03/22 1519   Peri-wound Assessment Intact 07/03/22 1519   Odor None 07/03/22 1519   Number of days: 11       Wound 06/15/22 Sacrum Posterior DTI, stage 2 / excoriation secondary to diarrhea (Active)   Wound Image   06/29/22 1431   Wound Etiology Pressure Unstageable 07/03/22 0730   Dressing Status Dry;Intact 07/03/22 0730   Wound Cleansed Cleansed with saline 07/02/22 1600    Dressing/Treatment Moist to dry;Pharmaceutical agent (see MAR) 07/02/22 1938   Offloading for Diabetic Foot Ulcers Offloading ordered 07/02/22 1938   Dressing Change Due 06/30/22 06/30/22 0900   Wound Assessment Eschar dry;Purple/maroon;Slough 07/02/22 1938   Drainage Amount Small (< 25%) 07/02/22 1938   Drainage Description Serosanguinous 07/02/22 1938   Odor None 07/02/22 1938   Peri-wound Assessment Denuded;Fragile 07/02/22 1938   Margins Defined edges 06/29/22 1955   Wound Thickness Description not for Pressure Injury Full thickness 06/29/22 1955   Number of days: 17       Wound 06/19/22 Toe (Comment  which one) Right (Active)   Wound Image    06/19/22 0930   Wound Etiology Arterial 07/03/22 0730   Dressing Status New dressing applied 07/03/22 0306   Wound Cleansed Betadine/povidone iodine 07/03/22 0730   Dressing/Treatment Open to air 07/03/22 0730   Offloading for Diabetic Foot Ulcers Offloading ordered 07/03/22 0730   Dressing Change Due 06/21/22 06/21/22 2000   Wound Assessment Eschar dry;Purple/maroon 07/03/22 0306   Drainage Amount Scant (moist but unmeasurable) 07/03/22 0306   Drainage Description Serosanguinous 07/03/22 0306   Odor None 07/03/22 0306   Peri-wound Assessment Ecchymosis;Edematous 07/03/22 0306   Margins Defined edges 06/25/22 0800   Wound Thickness Description not for Pressure Injury Full thickness 06/23/22 2000  Number of days: 14       Wound 06/19/22 Abdomen Mid Surgical site dehisced on 06/18/22  Now Wound VAC (Active)   Wound Image   07/03/22 1519   Wound Etiology Surgical 07/03/22 1519   Dressing Status New dressing applied 07/03/22 1519   Wound Cleansed Vashe 07/03/22 1519   Dressing/Treatment Hydrofera blue;Negative pressure wound therapy 07/03/22 1519   Offloading for Diabetic Foot Ulcers Offloading ordered 07/03/22 1519   Dressing Change Due 07/07/22 07/03/22 1519   Wound Length (cm) 22 cm 07/03/22 1519   Wound Width (cm) 3.8 cm 07/03/22 1519   Wound Depth (cm) 2.5 cm 07/03/22  1519   Wound Surface Area (cm^2) 83.6 cm^2 07/03/22 1519   Change in Wound Size % (l*w) 0.48 07/03/22 1519   Wound Volume (cm^3) 209 cm^3 07/03/22 1519   Wound Healing % 29 07/03/22 1519   Wound Assessment Granulation tissue;Slough 07/03/22 1519   Drainage Amount Small (< 25%) 07/03/22 1519   Drainage Description Serosanguinous 07/03/22 1519   Odor None 07/03/22 1519   Peri-wound Assessment Intact 07/03/22 1519   Margins Defined edges 07/03/22 1519   Wound Thickness Description not for Pressure Injury Full thickness 07/03/22 1519   Number of days: 14       Wound 06/19/22 Toe (Comment  which one) Left Black toes / necrotic. (Active)   Wound Image    06/19/22 1007   Wound Etiology Arterial 07/03/22 0730   Dressing Status Clean;Dry;Intact 06/29/22 1955   Wound Cleansed Betadine/povidone iodine 07/03/22 0730   Dressing/Treatment Open to air 07/03/22 0730   Dressing Change Due 06/22/22 06/19/22 1007   Wound Assessment Eschar dry 07/02/22 1938   Drainage Amount None (dry) 07/02/22 1938   Odor None 07/02/22 1938   Peri-wound Assessment Edematous;Fragile;Hyperpigmented;Ecchymosis 07/02/22 1938   Wound Thickness Description not for Pressure Injury Full thickness 06/29/22 1955   Number of days: 14      Treatment: the wound was cleansed with the Vashe solution and wiped with gauze to remove the old drainage and wound debris. The middle of the wound was dressed with Vashe solution moist strip of Hydrofera Blue and topped with the black Granufoam sponge. Occlusively sealed with the drape and the TRAC pad was applied to the bottom part of the wound.    Plan: next wound vac dressing change is due on Tuesday. Authorization for transfer is pending to [facility] where he [facility].   Pt. Can use some conversation with chaplin or nursing as he is bored in his bed.  He wants to work with therapy but he is so weak. Needs Re-hab.   Continue the Wound care for the Sacrum pressure injury as well.   PLEASE keep patient off of his buttocks.  He is able to turn all the way onto his hip - side lying position.   Ronnald Collum, RN, BSN, [employer]

## 2022-07-03 NOTE — Care Coordination-Inpatient (Addendum)
CM updated note - see previous CM note at 9/25 am:    CM received call from [facility] tx center April on 07/02/22 [phone] x [phone].   Means test has not been completed- she referred CM to call eligbilty at [phone] x [phone] or x [phone] as to status of means test - explained patient needs assist with placement no longer needs acute level of care- she directed CM to call SW staff and provided contact information.    10:30 am - CM contacted eleibility at above numbers provided and patient is eligible for VA benefits at the [facility] for clinic PCP and medication assistance- patient is not service connected.    CM called and left VM for SW staff at [facility] Ms.Aduku Wallene Dales [phone] and Christianne Dolin x [phone]. Awaiting call back. Ella Jubilee, MSW    CM spoke with Lelon Perla with [facility] [phone] x [phone]- confirmed patient's transportation would not be covered by [facility] to [facility] - would need to tx to closest [facility] but eligibility is determined by [facility] in [place].      CM spoke with Ms. Maren Reamer [phone] x [phone] - explained that patient no longer needs acute care- inquired about patient's VA benefits for:    [facility]  NH placement contractual  Outpatient HD set up    Ms. Mott will inquire about above and let CM know.  Patient has not been seen by [facility] in [place] since 2013. Ella Jubilee, MSW    CM placed call to [employer] with [employer] she has spoken with Susie wife and she is agreeable to pursuing LTACH in [place] under patients primary [employer] insurance- Chyrl Civatte will start authorization but will need updated wound care notes for measurements and frequency of changes CM left VM for wound care at x [phone].  Will also need IV antibiotics upon discharge.  Ella Jubilee, MSW    12;15 pm- Received a call from Ms Wallene Dales SW - she states that CM needs to contact the local [facility] for placement for LTACH, NHP or OP HD this is per Jacinto Halim contact number x [phone].  Indicated that local contact Meriam Sprague had actually directed this CM to call patients local [facility] with [place].  Ella Jubilee, MSW      CM received a call from [facility] with local [facility] and [facility] is not yet one of their contracted providers for [facility] location and unable to do a single case agreement - Joann to speak with her adminstration to see if there is any other options.  The [facility] in [place] is in network but transportation cost to the facility is a barrier.    CM has called [employer] to obtain an insurance CM via [phone] then tx to [facility] department nurses line at [phone] press 3 - spoke with Reslda who directed this CM to assigned RN Modena Nunnery at [phone] x [phone] with [facility]- CM left VM and awaiting call back at this time.    [facility] (spoke with Jey in admissions) going to check benefits to make sure [employer] is in network and will let Cm know.     CM also sent perfect serve to Dr. Deeann Saint regarding when patient may qualify for ESRD.  Informed that ESRD would be determined after 8 weeks from start date of dialysis of CRRT.  CVVHD started per progress notes on 06/01/22.  CM to follow.  Ella Jubilee, MSW  CM received a call back from Medina at [facility] - she has sent in a request for care support for discharge planning to call this CM - she states give it through Tuesday for staff to reach this CM - their contact information is [phone].  Will need to explore in network options/benefits of patient.  Will follow-up next week.  Ella Jubilee, MSW

## 2022-07-03 NOTE — Other (Signed)
07/03/22 0913   Treatment   Time On 0913   Treatment Goal 2kg removal; 3hr   Observations & Evaluations   Level of Consciousness 0   Oriented X 4   Heart Rhythm Regular   Respiratory Quality/Effort Unlabored   O2 Device None (Room air)   Bilateral Breath Sounds Clear   Skin Condition/Temp Swollen/edematous   Edema Generalized Trace   RUE Edema +2;Pitting   LUE Edema +2;Pitting   RLE Edema Trace   LLE Edema Trace   Vital Signs   BP (!) 141/70   Temp 97.7 F (36.5 C)   Pulse 81   Respirations 16   Pain Assessment   Pain Assessment None - Denies Pain   Technical Checks   Dialysis Machine No. 04   RO Machine Number [id number]   Dialyzer Lot No. [id number]   Tubing Lot Number [id number]   All Connections Secure Yes   NS Bag Yes   Saline Line Double Clamped Yes   Dialyzer Revaclear 300   Prime Volume (mL) 200 mL   ICEBOAT I;C;E;B;O;A;T   RO Machine Log Sheet Completed Yes   Machine Alarm Self Test Completed;Passed   Child psychotherapist Function   Extracorporeal Chemical engineer Conductivity 14   Manual Conductivity 14   Machine Ph 7.4   Bleach Test (Neg) Yes   Bath Temperature 96.8 F (36 C)   Tunneled Hemodialysis Catheter Right Subclavian   Placement Date: 07/01/22   Present on Admission/Arrival: No  Inserted by: Hope Riddell-NP  Insertion Practices: Chlorohexadine skin antisepsis;Maximal barrier precautions;Sterile ultrasound technique;Optimal catheter site selection;Hand hygiene  Orien...   Access Status  Accessed   Continued need for line? Yes   Site Assessment Clean, dry & intact   Venous Lumen Status Brisk blood return;Flushed;Infusing   Arterial Lumen Status Brisk blood return;Flushed;Infusing   Alcohol Cap Used Yes   Line Care Connections checked and tightened;Ports disinfected   Dressing Type Bacteriocidal;Transparent   Date of Last Dressing Change 07/01/22   Dressing Status Clean, dry & intact   Dressing Change Due 07/03/22   Treatment Initiation   Dialyze Hours 3   Treatment   Initiation Universal Precautions maintained;Lines secured to patient;Connections secured;Prime given;Venous Parameters set;Arterial Parameters set;IT consultant engaged;Saline line double clamped   During Hemodialysis Assessment   Blood Flow Rate (ml/min) 400 ml/min   Arterial Pressure (mmHg) -130 mmHg   Venous Pressure (mmHg) 100   TMP 60   DFR 600   Comments Treatment initiated   Access Visible Yes   Ultrafiltration Removed (mL) 0 ml/min   Provider Notification   Reason for Communication New orders  (decrease temp for hypotension?)   Provider Name McNeer   Provider Notification Physician   Method of Communication Face to face   Response See orders   Dialysis Bath   K+ (Potassium) 3   Ca+ (Calcium) 2.5   Na+ (Sodium) 140   HCO3 (Bicarb) 35   Primary RN SBAR: Jeanne Ivan, RN  Patient Education: procedural  Hepatitis B Surface Ag   Date/Time Value Ref Range Status   06/29/2022 09:53 AM <0.10 Index Final     Hep B S Ab   Date/Time Value Ref Range Status   06/29/2022 09:53 AM <3.10 mIU/mL Final

## 2022-07-03 NOTE — Progress Notes (Signed)
Hospitalist Progress Note    NAME:   Jaime [name]   DOB: 06-Dec-1945   MRN: [id number]     Date/Time: 07/03/2022 2:26 PM  Patient PCP: No primary care provider on file.    Estimated discharge date:9/2  Barriers: Stable for discharge snf placement , hd set up at rehab     For reference :   76 y/m with prolonged hospitalization, was admitted to surgical service on 7/31 for perforated hollow viscus underwent emergent laparotomy/ septic shock d/t peritonitis, was intubated and extubated x 2. Also had AKI needing CVVH, now on HD. Also has bilateral foot gangrene. Was transferred to hospitalist service on 8/21.      Assessment / Plan:  #Septic shock- resolved   D/t perforated viscus  S/p Laparotomy 7/31, with wound vac drainage   Liver abscess s/p IR drainage  Bacteremia   Final ID recommendation to continue:  -Ceftriaxone 2G   IV daily x 6 weeks end date 9/15  - Flagyl 500 mg po bid end date 9/8  - Pt will require repeat imaging of liver before conclusion of therapy  -Discussed with case management regarding discharge disposition - wife needs to set up financial assistance for transfer of patient to [place] . Other option could be to go to an SNF in [place] [facility] ) .         #Acute hypoxemic respiratory failure:  Intubated 7/31, extubation 8/7  -Reintubated 8/13, extubated on 8/19  -Currently on 2 liters NC  -Weaned off o2      #Dysphagia:  -Ongoing SLP evaluation-patient tolerating small diet .   -Aspiration on FEES study  -Follow-up SLP evaluation     #Acute kidney injury:  -Now on HD  -Continue with HD  -Nephrology following-planned for permcath in am tomorrow .   -Case management informed the need to set up HD at SNF prior to discharge .  -Will follow up cmp .     #Bilateral lower extremity critical limb ischemia  Gangrene both feet  -Podiatry and vascular surgery following  Cleared by podiatry for demarcation+ autoamputation    #Diabetes mellitus:  -Sliding scale and lantus  -Monitor FS  -Diabetes team following      #Sacral wound:  -Wound care following       Medical Decision Making:   I personally reviewed labs:na   I personally reviewed imaging: na   I personally reviewed EKG: None  Toxic drug monitoring: Monitor creatinine with abx  Discussed case with: RN, patient     Code Status: Full  DVT Prophylaxis: heparin  GI Prophylaxis:none   Subjective:     Chief Complaint / Reason for Physician Visit  Seen today patient going for dialysis, no new symptoms  Labs and charts reviewed and patient examined . Patient appeared comfortable and in no distress .         Objective:     VITALS:   Last 24hrs VS reviewed since prior progress note. Most recent are:  Patient Vitals for the past 24 hrs:   BP Temp Temp src Pulse Resp SpO2   07/03/22 1240 108/71 97.9 F (36.6 C) Oral 89 18 99 %   07/03/22 1212 111/61 97.6 F (36.4 C) Oral 90 16 --   07/03/22 1200 (!) 94/56 -- -- 91 -- --   07/03/22 1145 (!) 106/56 -- -- 83 -- --   07/03/22 1130 99/60 -- -- 81 -- --   07/03/22 1115 (!) 96/59 -- -- 92 -- --  07/03/22 1100 (!) 108/53 -- -- 89 -- --   07/03/22 1045 110/64 -- -- 85 -- --   07/03/22 1030 110/64 -- -- 82 -- --   07/03/22 1015 117/64 -- -- 71 -- --   07/03/22 1000 124/63 -- -- 88 -- --   07/03/22 0945 97/65 -- -- 89 -- --   07/03/22 0930 118/61 -- -- 84 -- --   07/03/22 0913 (!) 141/70 97.7 F (36.5 C) -- 81 16 --   07/03/22 0730 (!) 160/69 98.3 F (36.8 C) Oral 83 18 96 %   07/03/22 0306 134/78 98 F (36.7 C) Oral 85 20 99 %   07/03/22 0010 (!) 141/61 98.4 F (36.9 C) Oral 88 20 94 %   07/02/22 2233 (!) 141/63 98.4 F (36.9 C) Oral 89 18 98 %   07/02/22 1938 133/73 98.4 F (36.9 C) Oral 94 20 98 %   07/02/22 1525 114/66 98.6 F (37 C) Oral 83 19 95 %           Intake/Output Summary (Last 24 hours) at 07/03/2022 1426  Last data filed at 07/03/2022 1212  Gross per 24 hour   Intake 1073.33 ml   Output 2220 ml   Net -1146.67 ml          I had a face to face encounter and independently examined this patient on 07/03/2022, as outlined  below:  PHYSICAL EXAM:  General:          Alert, cooperative  EENT:              EOMI. Anicteric sclerae.Rt. hd catn - jugular   Resp:               CTA bilaterally, no wheezing or rales.  No accessory muscle use  CV:                  Regular  rhythm,  No edema, AS murmur   GI:                   Soft, Non distended,Mildly tender.  +Bowel sounds, wound vac in place , drain on rt upper quadrant - purulent material   Neurologic:       Alert and oriented X 3, normal speech,   Psych:   Good insight. Not anxious nor agitated  Skin:                No rashes.  No jaundice  Foot :  bilateral feet - gangrene of first second and third toes , not yet full demarcation           Reviewed most current lab test results and cultures  YES  Reviewed most current radiology test results   YES  Review and summation of old records today    NO  Reviewed patient's current orders and MAR    YES  PMH/SH reviewed - no change compared to H&P  ________________________________________________________________________  Care Plan discussed with:    Comments   Patient x    Family      RN x    Care Manager     Consultant                        Multidiciplinary team rounds were held today with case manager, nursing, pharmacist and Higher education careers adviser.  Patient's plan of care was discussed; medications were reviewed and discharge planning was addressed.     ________________________________________________________________________  Total NON critical care TIME:  53  Minutes    Total CRITICAL CARE TIME Spent:   Minutes non procedure based      Comments   >50% of visit spent in counseling and coordination of care     ________________________________________________________________________  Consepcion Hearing, MD     Procedures: see electronic medical records for all procedures/Xrays and details which were not copied into this note but were reviewed prior to creation of Plan.      LABS:  I reviewed today's most current labs and imaging studies.  Pertinent labs  include:  Recent Labs     07/01/22  0440 07/03/22  0912   WBC 13.6* 11.6*   HGB 8.4* 7.6*   HCT 27.5* 24.5*   PLT 208 189       Recent Labs     07/01/22  0440 07/02/22  0640 07/03/22  0912   NA  --  136 136   K  --  4.2 4.4   CL  --  104 102   CO2  --  24 27   GLUCOSE  --  114* 129*   BUN  --  50* 68*   CREATININE  --  4.02* 5.29*   CALCIUM  --  7.7* 8.3*   MG 2.5*  --  2.5*   PHOS 5.6*  --  5.1*   LABALBU 1.6*  --  1.8*   BILITOT 1.1*  --  1.1*   AST 39*  --  36   ALT 21  --  19         Signed: Consepcion Hearing, MD

## 2022-07-03 NOTE — Plan of Care (Signed)
Problem: Safety - Adult  Goal: Free from fall injury  07/03/2022 0954 by Reed Breech, RN  Outcome: Progressing  07/03/2022 0233 by Marita Snellen, RN  Outcome: Progressing     Problem: Respiratory - Adult  Goal: Achieves optimal ventilation and oxygenation  07/03/2022 0954 by Reed Breech, RN  Outcome: Progressing  07/03/2022 0233 by Marita Snellen, RN  Outcome: Progressing  Flowsheets (Taken 07/02/2022 1938)  Achieves optimal ventilation and oxygenation:   Assess for changes in respiratory status   Assess for changes in mentation and behavior   Position to facilitate oxygenation and minimize respiratory effort     Problem: Pain  Goal: Verbalizes/displays adequate comfort level or baseline comfort level  07/03/2022 0954 by Reed Breech, RN  Outcome: Progressing  Flowsheets (Taken 07/03/2022 0306 by Marita Snellen, RN)  Verbalizes/displays adequate comfort level or baseline comfort level:   Encourage patient to monitor pain and request assistance   Assess pain using appropriate pain scale  07/03/2022 0233 by Marita Snellen, RN  Outcome: Progressing  Flowsheets  Taken 07/03/2022 0010  Verbalizes/displays adequate comfort level or baseline comfort level:   Encourage patient to monitor pain and request assistance   Assess pain using appropriate pain scale  Taken 07/02/2022 1938  Verbalizes/displays adequate comfort level or baseline comfort level:   Encourage patient to monitor pain and request assistance   Assess pain using appropriate pain scale     Problem: Discharge Planning  Goal: Discharge to home or other facility with appropriate resources  07/03/2022 0954 by Reed Breech, RN  Outcome: Progressing  07/03/2022 0233 by Marita Snellen, RN  Outcome: Progressing  Flowsheets (Taken 07/02/2022 1938)  Discharge to home or other facility with appropriate resources:   Identify barriers to discharge with patient and caregiver   Arrange for needed discharge resources and transportation as appropriate     Problem: Chronic Conditions and Co-morbidities  Goal: Patient's chronic  conditions and co-morbidity symptoms are monitored and maintained or improved  07/03/2022 0954 by Reed Breech, RN  Outcome: Progressing  07/03/2022 0233 by Marita Snellen, RN  Outcome: Progressing  Flowsheets (Taken 07/02/2022 1938)  Care Plan - Patient's Chronic Conditions and Co-Morbidity Symptoms are Monitored and Maintained or Improved: Monitor and assess patient's chronic conditions and comorbid symptoms for stability, deterioration, or improvement     Problem: Neurosensory - Adult  Goal: Achieves maximal functionality and self care  07/03/2022 0954 by Reed Breech, RN  Outcome: Progressing  07/03/2022 0233 by Marita Snellen, RN  Outcome: Progressing     Problem: Cardiovascular - Adult  Goal: Maintains optimal cardiac output and hemodynamic stability  07/03/2022 0954 by Reed Breech, RN  Outcome: Progressing  07/03/2022 0233 by Marita Snellen, RN  Outcome: Progressing  Flowsheets (Taken 07/02/2022 1938)  Maintains optimal cardiac output and hemodynamic stability:   Monitor blood pressure and heart rate   Monitor urine output and notify Licensed Independent Practitioner for values outside of normal range   Assess for signs of decreased cardiac output  Goal: Absence of cardiac dysrhythmias or at baseline  07/03/2022 0233 by Marita Snellen, RN  Outcome: Progressing  Flowsheets (Taken 07/02/2022 1938)  Absence of cardiac dysrhythmias or at baseline:   Monitor cardiac rate and rhythm   Assess for signs of decreased cardiac output     Problem: Gastrointestinal - Adult  Goal: Maintains or returns to baseline bowel function  07/03/2022 0954 by Reed Breech, RN  Outcome: Progressing  07/03/2022 0233 by Marita Snellen, RN  Outcome: Progressing  Flowsheets (Taken 07/02/2022  75)  Maintains or returns to baseline bowel function:   Assess bowel function   Encourage oral fluids to ensure adequate hydration   Administer IV fluids as ordered to ensure adequate hydration  Goal: Maintains adequate nutritional intake  07/03/2022 0233 by Marita Snellen, RN  Outcome: Progressing  Flowsheets  (Taken 07/02/2022 1938)  Maintains adequate nutritional intake:   Monitor percentage of each meal consumed   Identify factors contributing to decreased intake, treat as appropriate   Assist with meals as needed     Problem: Genitourinary - Adult  Goal: Absence of urinary retention  07/03/2022 0954 by Reed Breech, RN  Outcome: Progressing  07/03/2022 0233 by Marita Snellen, RN  Outcome: Progressing     Problem: Metabolic/Fluid and Electrolytes - Adult  Goal: Electrolytes maintained within normal limits  07/03/2022 0954 by Reed Breech, RN  Outcome: Progressing  07/03/2022 0233 by Marita Snellen, RN  Outcome: Progressing  Flowsheets (Taken 07/02/2022 1938)  Electrolytes maintained within normal limits:   Monitor labs and assess patient for signs and symptoms of electrolyte imbalances   Administer electrolyte replacement as ordered   Monitor response to electrolyte replacements, including repeat lab results as appropriate   Fluid restriction as ordered   Instruct patient on fluid and nutrition restrictions as appropriate  Goal: Hemodynamic stability and optimal renal function maintained  07/03/2022 0233 by Marita Snellen, RN  Outcome: Progressing  Flowsheets (Taken 07/02/2022 1938)  Hemodynamic stability and optimal renal function maintained:   Monitor labs and assess for signs and symptoms of volume excess or deficit   Monitor intake, output and patient weight   Monitor urine specific gravity, serum osmolarity and serum sodium as indicated or ordered   Monitor response to interventions for patient's volume status, including labs, urine output, blood pressure (other measures as available)   Encourage oral intake as appropriate   Instruct patient on fluid and nutrition restrictions as appropriate  Goal: Glucose maintained within prescribed range  07/03/2022 0954 by Reed Breech, RN  Outcome: Progressing  07/03/2022 0233 by Marita Snellen, RN  Outcome: Progressing  Flowsheets (Taken 07/02/2022 1938)  Glucose maintained within prescribed range:   Monitor blood glucose  as ordered   Assess for signs and symptoms of hyperglycemia and hypoglycemia   Administer ordered medications to maintain glucose within target range   Assess barriers to adequate nutritional intake and initiate nutrition consult as needed   Instruct patient on self management of diabetes and initiate consult as needed     Problem: Skin/Tissue Integrity - Adult  Goal: Skin integrity remains intact  07/03/2022 0233 by Marita Snellen, RN  Outcome: Progressing  Flowsheets (Taken 07/02/2022 1938)  Skin Integrity Remains Intact:   Monitor for areas of redness and/or skin breakdown   Assess vascular access sites hourly   Every 4-6 hours minimum: Change oxygen saturation probe site   Every 4-6 hours: If on nasal continuous positive airway pressure, respiratory therapy assesses nares and determine need for appliance change or resting period  Goal: Incisions, wounds, or drain sites healing without S/S of infection  07/03/2022 0233 by Marita Snellen, RN  Outcome: Progressing  Flowsheets (Taken 07/02/2022 1938)  Incisions, Wounds, or Drain Sites Healing Without Sign and Symptoms of Infection: ADMISSION and DAILY: Assess and document risk factors for pressure ulcer development  Goal: Oral mucous membranes remain intact  07/03/2022 0233 by Marita Snellen, RN  Outcome: Progressing  Flowsheets (Taken 07/02/2022 1938)  Oral Mucous Membranes Remain Intact:   Assess oral mucosa and hygiene practices  Implement preventative oral hygiene regimen     Problem: Hematologic - Adult  Goal: Maintains hematologic stability  07/03/2022 0954 by Reed Breech, RN  Outcome: Progressing  07/03/2022 0233 by Marita Snellen, RN  Outcome: Progressing  Flowsheets (Taken 07/02/2022 1938)  Maintains hematologic stability: Assess for signs and symptoms of bleeding or hemorrhage     Problem: Musculoskeletal - Adult  Goal: Return mobility to safest level of function  07/03/2022 0233 by Marita Snellen, RN  Outcome: Progressing  Flowsheets (Taken 07/02/2022 1938)  Return Mobility to Safest Level of  Function:   Assess patient stability and activity tolerance for standing, transferring and ambulating with or without assistive devices   Assist with transfers and ambulation using safe patient handling equipment as needed   Ensure adequate protection for wounds/incisions during mobilization   Obtain physical therapy/occupational therapy consults as needed  Goal: Maintain proper alignment of affected body part  07/03/2022 0233 by Marita Snellen, RN  Outcome: Progressing  Flowsheets (Taken 07/02/2022 1938)  Maintain proper alignment of affected body part:   Support and protect limb and body alignment per provider's orders   Instruct and reinforce with patient and family use of appropriate assistive device and precautions (e.g. spinal or hip dislocation precautions)  Goal: Return ADL status to a safe level of function  07/03/2022 0233 by Marita Snellen, RN  Outcome: Progressing  Flowsheets (Taken 07/02/2022 1938)  Return ADL Status to a Safe Level of Function:   Administer medication as ordered   Assess activities of daily living deficits and provide assistive devices as needed   Obtain physical therapy/occupational therapy consults as needed   Assist and instruct patient to increase activity and self care as tolerated     Problem: Skin/Tissue Integrity  Goal: Absence of new skin breakdown  Description: 1.  Monitor for areas of redness and/or skin breakdown  2.  Assess vascular access sites hourly  3.  Every 4-6 hours minimum:  Change oxygen saturation probe site  4.  Every 4-6 hours:  If on nasal continuous positive airway pressure, respiratory therapy assess nares and determine need for appliance change or resting period.  07/03/2022 0233 by Marita Snellen, RN  Outcome: Progressing     Problem: ABCDS Injury Assessment  Goal: Absence of physical injury  07/03/2022 0233 by Marita Snellen, RN  Outcome: Progressing     Problem: Safety - Medical Restraint  Goal: Remains free of injury from restraints (Restraint for Interference with Medical  Device)  Description: INTERVENTIONS:  1. Determine that other, less restrictive measures have been tried or would not be effective before applying the restraint  2. Evaluate the patient's condition at the time of restraint application  3. Inform patient/family regarding the reason for restraint  4. Q2H: Monitor safety, psychosocial status, comfort, nutrition and hydration  07/03/2022 0233 by Marita Snellen, RN  Outcome: Progressing

## 2022-07-03 NOTE — Progress Notes (Addendum)
Bedside shift change report given to Rhae Hammock, Charity fundraiser (Cabin crew) by Irving Burton, RN (offgoing nurse). Report included the following information Nurse Handoff Report, Adult Overview, Surgery Report, Intake/Output, MAR, Recent Results, Cardiac Rhythm NSR, Quality Measures, and Neuro Assessment.     Braden Score 12. All of the following interventions have been implemented to prevent pressure injury:    SKIN ASSESSMENT (S)  Dual skin assessment completed at shift change: Yes  Name of second RN who completed Dual Skin Assessment: Irving Burton, RN  Picture of wound uploaded to EMR: Yes  Venelex ordered and given per protocol: Yes  Wound care consulted if wounds present: Yes    SURFACE (S)  Stryker air pump or specialty bed ordered: Yes  Type of bed: airbed w/ pump  Only white chux used with specialty surfaces (no green chux used): Yes  Waffle cushion used for chair positioning: NA    KEEP MOVING (K)  Mobility status (Bedrest, Chairbound, UWA x 1 assist , 2 assist, Max assist): Max  Q2 hour turns documented: Yes  Refusals to turn and education provided documented: NA  Device used to float heels: Blue float foam pillow   PT/OT consulted: Yes    INCONTINENCE (I)  Incontinence status assessed Q2 hours: Yes  External catheter in use: Pt oliquric  Barrier cream in use: yes    NUTRITION (N)  I/O's documented every 8 hours: Yes  Oral supplements ordered if appropriate: NA  Nutrition services consulted: Yes    All concerns about new DTI's must be escalated directly to attending MD, charge nurse, CCL and nurse director.     0320[name] in patient's room to draw labs and get vitals. Patient's endurance catheter not giving blood return, therefore, unable to draw labs from endurance line. Patient has +2 pitting bilateral upper extremity edema which makes it difficult to drawl peripheral labs with a butterfly. Patient also does not like to be stuck for labs and got agitated. This RN educated patient on the importance on getting labs drawn this morning  d/t patient needing to get hemodialysis today. Patient finally agreed and only agrees to being stuck once only. This RN was able to successfully draw some labs, however while drawing labs, the blood flow through the butterfly catheter was extremely slow. This RN had hold patient that there is high possibilities that these samples would hemolyze and lab will not be able to use them. Patient got very angry and agitated and said : "No more lab draws, if they want lab they will need to put a line in to pull from it, otherwise no one is touching me anymore". Patient also refused CHG bath this morning.    0400: Lab department called to notify this RN that patient's lab specimens were severely hemolyzed and unable to use, will need new samples. As stated before, patient had refused for labs to be drawn again this shift. Will pass on to oncoming nurse about situation.      End of Shift Note    Bedside shift change report given to Irving Burton, RN (oncoming nurse) by Marita Snellen, RN (offgoing nurse).  Report included the following information SBAR, Kardex, Procedure Summary, Intake/Output, MAR, Accordion, Med Rec Status, Cardiac Rhythm NSR, and Quality Measures    Shift worked:  1900-0730     Shift summary and any significant changes:     Listed above, need labs.     Concerns for physician to address:  N/A     Zone phone for oncoming shift:  [phone]       Activity:     Number times ambulated in hallways past shift: 0  Number of times OOB to chair past shift: 0    Cardiac:   Cardiac Monitoring: Yes           Access:  Current line(s): PIV     Genitourinary:   Urinary status: oliguric    Respiratory:      Chronic home O2 use?: NO  Incentive spirometer at bedside: N/A       GI:     Current diet:  ADULT DIET; Dysphagia - Soft and Bite Sized; 5 carb choices (75 gm/meal)  ADULT ORAL NUTRITION SUPPLEMENT; Breakfast, Lunch, Dinner; Renal Oral Supplement  Passing flatus: YES  Tolerating current diet: YES       Pain Management:   Patient states  pain is manageable on current regimen: YES    Skin:     Interventions: specialty bed, float heels, and PT/OT consult    Patient Safety:  Fall Score:    Interventions: bed/chair alarm and stay with me (per policy)       Length of Stay:  Expected LOS: 37  Actual LOS: 32      Marita Snellen, RN

## 2022-07-03 NOTE — Progress Notes (Signed)
NAME: Jaime [name]        DOB:  [birth year]        MRN:  [id number]                     Assessment   :                                               Plan:  AKI  Pancreatitis  Hyponatremia  DM  anemia KRT initiated 7/31; now on MWF schedule; perm cath 8/30;  HD today    Needs outpatient HD placement under AKI diagnosis - CM working on options (here or back in [place])    Watch for renal recovery (labs pending today), but none so far.    H/H not at goal.  Continue ESA.    Will see again on Monday, but please call with questions or changes in the meantime.               Subjective:     Chief Complaint:   No complaints. The patient was seen on dialysis at 9:17 AM .  BP is stable. Catheter is functioning well. D/W HD nurse.          Review of Systems:    Symptom Y/N Comments  Symptom Y/N Comments   Fever/Chills    Chest Pain     Poor Appetite    Edema     Cough    Abdominal Pain     Sputum    Joint Pain     SOB/DOE    Pruritis/Rash     Nausea/vomit    Tolerating PT/OT     Diarrhea    Tolerating Diet     Constipation    Other       Could not obtain due to:      Objective:     VITALS:   Last 24hrs VS reviewed since prior progress note. Most recent are:  Vitals:    07/03/22 0730   BP: (!) 160/69   Pulse: 83   Resp: 18   Temp: 98.3 F (36.8 C)   SpO2: 96%       Intake/Output Summary (Last 24 hours) at 07/03/2022 0915  Last data filed at 07/03/2022 5188  Gross per 24 hour   Intake 473.33 ml   Output 20 ml   Net 453.33 ml        Telemetry Reviewed:     PHYSICAL EXAM:  General: NAD  1+ edema      Lab Data Reviewed: (see below)    Medications Reviewed: (see below)    PMH/SH reviewed - no change compared to H&P  ________________________________________________________________________  Care Plan discussed with:  Patient     Family      RN     Care Manager                    Consultant:          Comments   >50% of visit spent in counseling and coordination of care        ________________________________________________________________________  Tanna Savoy, MD     Procedures: see electronic medical records for all procedures/Xrays and details which  were not copied into this note but were reviewed prior to creation of Plan.      LABS:  Recent Labs  07/01/22  0440   WBC 13.6*   HGB 8.4*   HCT 27.5*   PLT 208       Recent Labs     06/30/22  1251 07/01/22  0440 07/02/22  0640   NA 135*  --  136   K 4.5  --  4.2   CL 100  --  104   CO2 27  --  24   BUN 69*  --  50*   MG  --  2.5*  --    PHOS  --  5.6*  --        Recent Labs     06/30/22  1251 07/01/22  0440   GLOB 5.2* 5.0*       No results for input(s): INR, APTT in the last 72 hours.    Invalid input(s): PTP   No results for input(s): TIBC, FERR in the last 72 hours.    Invalid input(s): FE, PSAT   No results found for: FOL, RBCF   No results for input(s): PH, PCO2, PO2 in the last 72 hours.  No results for input(s): CPK, CKMB, TROPONINI in the last 72 hours.  No components found for: [facility]  @labua @    MEDICATIONS:  Current Facility-Administered Medications   Medication Dose Route Frequency    heparin (porcine) injection 5,000 Units  5,000 Units SubCUTAneous 3 times per day    cefTRIAXone (ROCEPHIN) 2,000 mg in sodium chloride 0.9 % 50 mL IVPB (mini-bag)  2,000 mg IntraVENous Q24H    metronidazole (FLAGYL) 500 mg in 0.9% NaCl 100 mL IVPB premix  500 mg IntraVENous q8h    therapeutic multivitamin-minerals 1 tablet  1 tablet Per NG tube Daily    amiodarone (CORDARONE) tablet 200 mg  200 mg Per NG tube Daily    acidophilus probiotic capsule 1 capsule  1 capsule Oral Daily    lidocaine 2 % injection 20 mL  20 mL IntraDERmal Once    collagenase ointment   Topical Daily    epoetin alfa-epbx (RETACRIT) injection 6,000 Units  6,000 Units SubCUTAneous Once per day on Mon Wed Fri    fentaNYL (SUBLIMAZE) injection 25 mcg  25 mcg IntraVENous Q1H PRN    oxyCODONE (ROXICODONE) immediate release tablet 5 mg  5 mg Oral Q4H PRN    midodrine  (PROAMATINE) tablet 5 mg  5 mg Oral Daily PRN    insulin lispro (HUMALOG) injection vial 0-4 Units  0-4 Units SubCUTAneous 4 times per day    bisacodyl (DULCOLAX) suppository 10 mg  10 mg Rectal Daily PRN    albumin human 25% IV solution 25 g  25 g IntraVENous PRN    heparin (porcine) injection 1,200 Units  1,200 Units IntraCATHeter PRN    And    heparin (porcine) injection 1,000 Units  1,000 Units IntraCATHeter PRN    sodium chloride 0.9 % bolus 100 mL  100 mL IntraVENous PRN    balsum peru-castor oil (VENELEX) ointment   Topical BID    glucose chewable tablet 16 g  4 tablet Oral PRN    dextrose bolus 10% 125 mL  125 mL IntraVENous PRN    Or    dextrose bolus 10% 250 mL  250 mL IntraVENous PRN    glucagon (rDNA) injection 1 mg  1 mg SubCUTAneous PRN    dextrose 10 % infusion   IntraVENous Continuous PRN    sodium chloride flush 0.9 % injection 5-40 mL  5-40 mL IntraVENous 2 times per day  sodium chloride flush 0.9 % injection 5-40 mL  5-40 mL IntraVENous PRN    0.9 % sodium chloride infusion   IntraVENous PRN    acetaminophen (TYLENOL) tablet 650 mg  650 mg Oral Q6H PRN    Or    acetaminophen (TYLENOL) suppository 650 mg  650 mg Rectal Q6H PRN    ondansetron (ZOFRAN) injection 4 mg  4 mg IntraVENous Q6H PRN

## 2022-07-03 NOTE — Progress Notes (Addendum)
0700: Bedside and Verbal shift change report given to Irving Burton, Charity fundraiser (Cabin crew) by tee, Museum/gallery conservator). Report included the following information Nurse Handoff Report, Index, Intake/Output, MAR, and Recent Results.      0730: Verbal phone report given to dialysis, RN. Dialysis RN stated to give amiodarone and hold all other medication.     0900: Patient off floor to dialysis.     1100: Patient still off floor at dialysis. RN to reassess upon return.     1240: Patient returned to floor, RN received at bedside, VS obtained and reassessment complete.     1500: Wound Care at bedside     1530: RN preformed wound care on sacrum and feet. Bed linen changed and patient bathed.     Braden Score 12. All of the following interventions have been implemented to prevent pressure injury:    SKIN ASSESSMENT (S)  Dual skin assessment completed at shift change: Yes  Name of second RN who completed Dual Skin Assessment: Tee, RN  Picture of wound uploaded to EMR: Yes  Venelex ordered and given per protocol: Yes  Wound care consulted if wounds present: Yes    SURFACE (S)  Stryker air pump or specialty bed ordered: Yes  Type of bed: air bed   Only white chux used with specialty surfaces (no green chux used): Yes  Waffle cushion used for chair positioning: NA    KEEP MOVING (K)  Mobility status (Bedrest, Chairbound, UWA x 1 assist , 2 assist, Max assist): bed rest  Q2 hour turns documented: Yes  Refusals to turn and education provided documented: NA  Device used to float heels: heel lift and pillows  PT/OT consulted: Yes    INCONTINENCE (I)  Incontinence status assessed Q2 hours: Yes  External catheter in use: oliguric   Barrier cream in use: venelex    NUTRITION (N)  I/O's documented every 8 hours: Yes  Oral supplements ordered if appropriate: Yes  Nutrition services consulted: Yes    All concerns about new DTI's must be escalated directly to attending MD, charge nurse, CCL and nurse director.     End of Shift Note    Bedside shift  change report given to RN (oncoming nurse) by Reed Breech, RN (offgoing nurse).  Report included the following information SBAR, Kardex, Intake/Output, MAR, and Recent Results    Shift worked:  0700-1900     Shift summary and any significant changes:    See above     Concerns for physician to address: N/a     Zone phone for oncoming shift:       Activity:     Number times ambulated in hallways past shift: 0  Number of times OOB to chair past shift: 0    Cardiac:   Cardiac Monitoring: Yes           Access:  Current line(s): PIV and HD access     Genitourinary:   Urinary status: oliguric    Respiratory:      Chronic home O2 use?: NO  Incentive spirometer at bedside: N/A       GI:     Current diet:  ADULT DIET; Dysphagia - Soft and Bite Sized; 5 carb choices (75 gm/meal)  ADULT ORAL NUTRITION SUPPLEMENT; Breakfast, Lunch, Dinner; Renal Oral Supplement  Passing flatus: YES  Tolerating current diet: YES       Pain Management:   Patient states pain is manageable on current regimen: YES    Skin:  Interventions: turn team, specialty bed, float heels, increase time out of bed, foam dressing, PT/OT consult, limit briefs, and nutritional support    Patient Safety:  Fall Score:    Interventions: bed/chair alarm, assistive device (walker, cane. etc), gripper socks, pt to call before getting OOB, and stay with me (per policy)       Length of Stay:  Expected LOS: 37  Actual LOS: 32      Charles Andringa MEAD, RN

## 2022-07-03 NOTE — Progress Notes (Signed)
Occupational Therapy   Chart reviewed; patient currently off unit at HD; will retry later as able. Canyon Lohr OTR/L

## 2022-07-03 NOTE — Progress Notes (Addendum)
Infectious Disease Progress    Impression      Hepatic abscess  Gas and fluid containing collection 7.1 x 10.8 x 5.4 cm  In anterior left lobe of liver, patchy areas of hypodensity right lobe  4 x 4.5 x 5.5 cm collection  S/p drainage by IR 8/4.Cultures + for  few E.coli  Repeat CT 8/7 shows reduction in size of hepatic abscess  Plan was for ceftriaxone IV x6 weeks end date 9/15, Flagyl  Until 8/18, now continued due to persistent leukocytosis  Repeat CT 8/13 + for    S/p percutaneous drainage of left hepatic abscess with  Minimal fluid remaining around catheter, suggestion of peripancreatic edema, small bilateral pleural effusions  Repeat CT abdomen pelvis 8/16+ for increased peripancreatic edema suspicious for pancreatitis, small residual fluid collection in left hepatic lobe  Lipase 737      Sepsis  Septic shock.  Resolved    E. coli bacteremia  Blood cultures 7/31+ for E. coli   2/2 LAC (pan sensitive)   Negative repeat cultures 8/4   Treated.    Acute abdomen  Pneumoperitoneum  S/p diagnostic laparoscopy, laparotomy  EGD 7/31  Findings of cloudy fluid in right upper quadrant, yellow/white  Inflammatory peel over lesser curve of stomach, inferior lobe of liver  No gross perforation identified  Pancreas and retroperitoneum appeared edematous  Intra-Op fluid culture + for E. Coli (pansensitive)      Leukocytosis, persistent   Improving    S/p abdominal wound dehiscence  Clean, no purulence per wound care  Cultures 8/17, 8/18-NG.    Gangrene, discoloration of toes  Persist      Acute hypoxic respiratory failure  Re intubated 8/13, extubated 8/19  Initially intubated 7/31, s/p extubation 8/7  CXR  8/20 + + for atelectasis  Resp cultures  8/13+ for light yeast  Apparent C.albicans/ dubliensis        AKI  Cr 5.29 on HD    Coagulopathy  Improving    Thrombocytopenia  resolved      Transaminitis  Improving    Hyperbilirubinemia   Improved  Bilirubin 1.1    Diabetes type 2  Hyperglycemia  A1c 7.7    Diarrhea  FMS  present    Obesity  BMI 34.76  Plan  Continue ceftriaxone 2 g IV daily  Pt will need  a total of  6 weeks of coverage of  liver abscess planned end date 9/15.   Continue Flagyl given complicated history & persistent leucocytosis  Patient would require repeat imaging prior to DC of anyimicrobials  .  Antimicrobial orders for discharge  -Ceftriaxone 2G   IV daily x 6 weeks end date 9/15  - Flagyl 500 mg po bid end date 9/8  - Pt will require repeat imaging of liver before conclusion of therapy  -Weekly CBC, CMP-  -Encourage adequate fluids, daily probiotic/yogurt  -ID follow-up - if pt remains in [place] of [place] call [phone]   For follow up.    Possible adverse effects of long term antibiotics are inclusive of but not limited to following  BM suppression, neutropenia , cytopenias , aplastic anemia hemorrhage liver & renal dysfunction/ liver , renal failure  , GI dysfunction- N, V  Diarrhea,C.difficile disease, rash , allergy , anaphylaxis.toxic epidermal necrolysis  Neuro toxicity , seizure disorder  Side effects tend to be more pronounced in the elderly  Pt to report persistent symptoms such as N,V , diarrhea, numbness tingling of extremities  Or any other symptoms  Please contact ID on call with questions, concerns over the weekend.        Extensive review of chart notes, labs, imaging, cultures done  Additionally review of done:  D/w ICU team, Dr. Salena Saner    Patient seen today.  Awake, talking  Accordion draining greenish thick liquid      History reviewed. No pertinent past medical history.    Past Surgical History:   Procedure Laterality Date    BLADDER SURGERY N/A 06/01/2022    ENDOSCOPY OF ILEAL CONDUIT performed by Guinevere Ferrari, MD at [facility]    CT VISCERAL PERCUTANEOUS DRAIN  06/05/2022    CT VISCERAL PERCUTANEOUS DRAIN 06/05/2022 [facility]    IR TUNNELED CATHETER PLACEMENT GREATER THAN 5 YEARS  07/01/2022    IR TUNNELED CATHETER PLACEMENT GREATER THAN 5 YEARS 07/01/2022 [facility], APRN - NP [facility]  ANGIO IR    LAPAROSCOPY N/A 06/01/2022    LAPAROSCOPY DIAGNOSTIC performed by Guinevere Ferrari, MD at [facility]    LAPAROTOMY N/A 06/01/2022    LAPAROTOMY EXPLORATORY performed by Guinevere Ferrari, MD at [facility]       Allergies   Allergen Reactions    Augmentin [Amoxicillin-Pot Clavulanate] Hives     Tolerated ceftriaxone 06/2022    Codeine      Unknown reaction       Social Connections: Not on file       No family status information on file.           Review of Systems - Negative except those mentioned in H&P      PHYSICAL EXAM:  General:          Awake, in no distress  EENT:              EOMI. Anicteric sclerae. MMM  Resp:               CTA bilaterally, no wheezing or rales.  No accessory muscle use  CV:                  Regular  rhythm,  No edema  GI:                   Soft, Non distended, Non tender.  +Bowel sounds  Neurologic:      Alert and oriented X 3, normal speech,   Psych:             Good insight. Not anxious nor agitated  Skin:                No rashes.  No jaundice.  Extremities :  No edema, discoloration of  toes++.   Drain+ for green thick fluid++  Waymon Amato, MD Jerrel Ivory

## 2022-07-03 NOTE — Care Coordination-Inpatient (Signed)
Transition of Care Plan:     RUR: 19% (moderate RUR)  Prior Level of Functioning:  Independent - lives with wife in [place].  Disposition: [facility] ([place] [place] - completed [employer] Forms for financial eligibility faxed to the [place] in [place] and [place] vs. Placement in [place] area for SNF/LTAC  If SNF or IPR: Date FOC offered: 06/30/2022 SNF  Date FOC received: Agreeable to [facility]  Accepting facility: TBD  Date authorization started with reference number: TBD  Date authorization received and expires: TBD  Follow up appointments: PCP/Specialist  DME needed: TBD  Transportation at discharge: [facility] anticipated  IM/IMM Medicare/Tricare letter given: 2nd IM needed prior to discharge.  Is patient a Veteran and connected with VA? yes              If yes, was [employer] transfer form completed and VA notified? No.  Caregiver Contact: Geary Rufo - Wife - [phone]  Discharge Caregiver contacted prior to discharge? Patient to contact.  Care Conference needed? No.  Barriers to discharge: nephrology/new HD?, podiatry/cards clearance, surgical/ID clearance, wound vac, placement     [facility] in [place] cannot provide HD to patient if agreeable to go to their SNF unless they have a diagnosis of ESRD.  An alternate HD center would need to be set up and patient would need to finance transportation between facility and HD.  It will need to be determined if HD is to be set up in [place] or [place].  Patient is not strong enough at this time to transport via car to return home.  Patient/family does not have the finances to transport patient home via ambulance.  [facility] [place] [facility] been sent Means Test Forms to assistance with finances to have patient transported and admitted to their facility.  LTAC inquiry placed with [facility] in [place], [place] and [place], [place]; [place] liaison will try to reach out to [place] location.    [id number] - CM spoke with spouse who is agreeable to patient going  to [facility], specifically the [place] location.  Joann with [facility] in [place] notified of this.         Ronnell Freshwater, BSN, RN     Care Management  [phone]

## 2022-07-04 LAB — BASIC METABOLIC PANEL
Anion Gap: 6 mmol/L (ref 5–15)
BUN: 41 MG/DL — ABNORMAL HIGH (ref 6–20)
Bun/Cre Ratio: 11 — ABNORMAL LOW (ref 12–20)
CO2: 29 mmol/L (ref 21–32)
Calcium: 8.4 MG/DL — ABNORMAL LOW (ref 8.5–10.1)
Chloride: 102 mmol/L (ref 97–108)
Creatinine: 3.63 MG/DL — ABNORMAL HIGH (ref 0.70–1.30)
Est, Glom Filt Rate: 17 mL/min/{1.73_m2} — ABNORMAL LOW (ref >60)
Glucose: 125 mg/dL — ABNORMAL HIGH (ref 65–100)
Potassium: 4 mmol/L (ref 3.5–5.1)
Sodium: 137 mmol/L (ref 136–145)

## 2022-07-04 LAB — POCT GLUCOSE
POC Glucose: 118 mg/dL — ABNORMAL HIGH (ref 65–117)
POC Glucose: 123 mg/dL — ABNORMAL HIGH (ref 65–117)
POC Glucose: 129 mg/dL — ABNORMAL HIGH (ref 65–117)
POC Glucose: 129 mg/dL — ABNORMAL HIGH (ref 65–117)
POC Glucose: 125 mg/dL — ABNORMAL HIGH (ref 65–117)

## 2022-07-04 MED ORDER — GUAIFENESIN 100 MG/5ML PO LIQD
100 | ORAL | Status: DC | PRN
Start: 2022-07-04 — End: 2022-08-03
  Administered 2022-07-04 (×2): 200 mg via ORAL

## 2022-07-04 MED FILL — HEPARIN SODIUM (PORCINE) 5000 UNIT/ML IJ SOLN: 5000 UNIT/ML | INTRAMUSCULAR | Qty: 1

## 2022-07-04 MED FILL — METRONIDAZOLE 500 MG/100ML IV SOLN: 500 MG/100ML | INTRAVENOUS | Qty: 100

## 2022-07-04 MED FILL — RISAQUAD-2 PO CAPS: ORAL | Qty: 1

## 2022-07-04 MED FILL — CEFTRIAXONE SODIUM 2 G IJ SOLR: 2 g | INTRAMUSCULAR | Qty: 2000

## 2022-07-04 MED FILL — THERA M PLUS PO TABS: ORAL | Qty: 1

## 2022-07-04 MED FILL — GUAIFENESIN 100 MG/5ML PO LIQD: 100 MG/5ML | ORAL | Qty: 10

## 2022-07-04 MED FILL — AMIODARONE HCL 200 MG PO TABS: 200 MG | ORAL | Qty: 1

## 2022-07-04 NOTE — Plan of Care (Signed)
Braden Score 12. All of the following interventions have been implemented to prevent pressure injury:    SKIN ASSESSMENT (S)  Dual skin assessment completed at shift change: Yes  Name of second RN who completed Dual Skin Assessment: Katie, RN  Picture of wound uploaded to EMR: Yes  Venelex ordered and given per protocol: Yes  Wound care consulted if wounds present: Yes    SURFACE (S)  Stryker air pump or specialty bed ordered: Yes  Type of bed: air bed with pump  Only white chux used with specialty surfaces (no green chux used): Yes  Waffle cushion used for chair positioning: NA    KEEP MOVING (K)  Mobility status (Bedrest, Chairbound, UWA x 1 assist , 2 assist, Max assist): Max  Q2 hour turns documented: Yes  Refusals to turn and education provided documented: NA  Device used to float heels: Blue off loading foam pillow & pillows  PT/OT consulted: Yes    INCONTINENCE (I)  Incontinence status assessed Q2 hours: Yes  External catheter in use: No, pt is oliguric  Barrier cream in use: yes    NUTRITION (N)  I/O's documented every 8 hours: Yes  Oral supplements ordered if appropriate: Yes  Nutrition services consulted: Yes    All concerns about new DTI's must be escalated directly to attending MD, charge nurse, CCL and nurse director.

## 2022-07-04 NOTE — Plan of Care (Signed)
Problem: Safety - Adult  Goal: Free from fall injury  Outcome: Progressing     Problem: Respiratory - Adult  Goal: Achieves optimal ventilation and oxygenation  Outcome: Progressing     Problem: Pain  Goal: Verbalizes/displays adequate comfort level or baseline comfort level  Outcome: Progressing  Flowsheets  Taken 07/04/2022 0330 by Marita Snellen, RN  Verbalizes/displays adequate comfort level or baseline comfort level:   Encourage patient to monitor pain and request assistance   Assess pain using appropriate pain scale  Taken 07/04/2022 0000 by Marita Snellen, RN  Verbalizes/displays adequate comfort level or baseline comfort level:   Encourage patient to monitor pain and request assistance   Assess pain using appropriate pain scale  Taken 07/03/2022 2026 by Marita Snellen, RN  Verbalizes/displays adequate comfort level or baseline comfort level:   Encourage patient to monitor pain and request assistance   Assess pain using appropriate pain scale     Problem: Discharge Planning  Goal: Discharge to home or other facility with appropriate resources  Outcome: Progressing  Flowsheets (Taken 07/03/2022 2026 by Marita Snellen, RN)  Discharge to home or other facility with appropriate resources:   Identify barriers to discharge with patient and caregiver   Arrange for needed discharge resources and transportation as appropriate     Problem: Chronic Conditions and Co-morbidities  Goal: Patient's chronic conditions and co-morbidity symptoms are monitored and maintained or improved  Outcome: Progressing  Flowsheets (Taken 07/03/2022 2026 by Marita Snellen, RN)  Care Plan - Patient's Chronic Conditions and Co-Morbidity Symptoms are Monitored and Maintained or Improved: Monitor and assess patient's chronic conditions and comorbid symptoms for stability, deterioration, or improvement

## 2022-07-04 NOTE — Progress Notes (Signed)
Hospitalist Progress Note    NAME:   Jaime [name]   DOB: [birth year]   MRN: [id number]     Date/Time: 07/04/2022 12:04 PM  Patient PCP: No primary care provider on file.    Estimated discharge date:9/2  Barriers: Stable for discharge snf placement , hd set up at rehab   For reference :   76 y/m with prolonged hospitalization, was admitted to surgical service on 7/31 for perforated hollow viscus underwent emergent laparotomy/ septic shock d/t peritonitis, was intubated and extubated x 2. Also had AKI needing CVV[facility], now on HD. Also has bilateral foot gangrene. Was transferred to hospitalist service on 8/21.      Assessment / Plan:  #Septic shock- resolved   D/t perforated viscus  S/p Laparotomy 7/31, with wound vac drainage   Liver abscess s/p IR drainage  Bacteremia   Final ID recommendation to continue:  -Ceftriaxone 2G   IV daily x 6 weeks end date 9/15  - Flagyl 500 mg po bid end date 9/8  - Pt will require repeat imaging of liver before conclusion of therapy  -Discussed with case management regarding discharge disposition - wife needs to set up financial assistance for transfer of patient to [place] . Other option could be to go to an SNF in [place] [facility] ) .  Continue same plan as above        #Acute hypoxemic respiratory failure:  Intubated 7/31, extubation 8/7  -Reintubated 8/13, extubated on 8/19  -Currently on 2 liters [place]  -Weaned off o2      #Dysphagia:  -Ongoing SLP evaluation-patient tolerating small diet .   -Aspiration on FEES study  -Follow-up SLP evaluation     #Acute kidney injury:  -Now on HD  -Continue with HD  -Nephrology following-planned for permcath in am tomorrow .   -Case management informed the need to set up HD at SNF prior to discharge .  -Will follow up cmp .     #Bilateral lower extremity critical limb ischemia  Gangrene both feet  -Podiatry and vascular surgery following  Cleared by podiatry for demarcation+ autoamputation    #Diabetes mellitus:  -Sliding scale and lantus  -Monitor  FS  -Diabetes team following     #Sacral wound:  -Wound care following       Medical Decision Making:   I personally reviewed labs:na   I personally reviewed imaging: na   I personally reviewed EKG: None  Toxic drug monitoring: Monitor creatinine with abx  Discussed case with: RN, patient     Code Status: Full  DVT Prophylaxis: heparin  GI Prophylaxis:none   Subjective:     Chief Complaint / Reason for Physician Visit  Labs and charts reviewed and patient examined . Patient appeared comfortable and in no distress .  Stated he feels a little bit down but I reassured the patient he is improving encouraged him to continue with small exercises as he able for hands and feet         Objective:     VITALS:   Last 24hrs VS reviewed since prior progress note. Most recent are:  Patient Vitals for the past 24 hrs:   BP Temp Temp src Pulse Resp SpO2   07/04/22 1100 110/63 -- Oral 93 20 97 %   07/04/22 0858 (!) 146/71 -- -- 89 -- --   07/04/22 0715 139/72 97.5 F (36.4 C) Oral 87 18 98 %   07/04/22 0330 (!) 143/68 97.7 F (36.5 C) Oral 78  17 96 %   07/04/22 0000 (!) 143/62 98 F (36.7 C) Oral 82 16 92 %   07/03/22 2303 -- -- -- 83 -- --   07/03/22 2026 126/60 97.9 F (36.6 C) Oral 82 15 95 %   07/03/22 1904 -- -- -- 82 -- --   07/03/22 1550 (!) 149/75 98.1 F (36.7 C) Oral 95 18 98 %   07/03/22 1240 108/71 97.9 F (36.6 C) Oral 89 18 99 %   07/03/22 1212 111/61 97.6 F (36.4 C) Oral 90 16 --           Intake/Output Summary (Last 24 hours) at 07/04/2022 1204  Last data filed at 07/04/2022 1100  Gross per 24 hour   Intake 1288.34 ml   Output 2210 ml   Net -921.66 ml          I had a face to face encounter and independently examined this patient on 07/04/2022, as outlined below:  PHYSICAL EXAM:  General:          Alert, cooperative  EENT:              EOMI. Anicteric sclerae.Rt. hd catn - jugular   Resp:               CTA bilaterally, no wheezing or rales.  No accessory muscle use  CV:                  Regular  rhythm,  No edema,  AS murmur   GI:                   Soft, Non distended,Mildly tender.  +Bowel sounds, wound vac in place , drain on rt upper quadrant - purulent material   Neurologic:       Alert and oriented X 3, normal speech,   Psych:   Good insight. Not anxious nor agitated  Skin:                No rashes.  No jaundice  Foot :  bilateral feet - gangrene of first second and third toes , not yet full demarcation           Reviewed most current lab test results and cultures  YES  Reviewed most current radiology test results   YES  Review and summation of old records today    NO  Reviewed patient's current orders and MAR    YES  PMH/SH reviewed - no change compared to H&P  ________________________________________________________________________  Care Plan discussed with:    Comments   Patient x    Family      RN x    Care Manager     Consultant                        Multidiciplinary team rounds were held today with case manager, nursing, pharmacist and Higher education careers adviser.  Patient's plan of care was discussed; medications were reviewed and discharge planning was addressed.     ________________________________________________________________________  Total NON critical care TIME:  54  Minutes    Total CRITICAL CARE TIME Spent:   Minutes non procedure based      Comments   >50% of visit spent in counseling and coordination of care     ________________________________________________________________________  Consepcion Hearing, MD     Procedures: see electronic medical records for all procedures/Xrays and details which were not copied into this note but were reviewed prior to  creation of Plan.      LABS:  I reviewed today's most current labs and imaging studies.  Pertinent labs include:  Recent Labs     07/03/22  0912   WBC 11.6*   HGB 7.6*   HCT 24.5*   PLT 189       Recent Labs     07/02/22  0640 07/03/22  0912 07/04/22  0345   NA 136 136 137   K 4.2 4.4 4.0   CL 104 102 102   CO2 24 27 29    GLUCOSE 114* 129* 125*   BUN 50* 68* 41*    CREATININE 4.02* 5.29* 3.63*   CALCIUM 7.7* 8.3* 8.4*   MG  --  2.5*  --    PHOS  --  5.1*  --    LABALBU  --  1.8*  --    BILITOT  --  1.1*  --    AST  --  36  --    ALT  --  19  --          Signed: , MD

## 2022-07-04 NOTE — Plan of Care (Signed)
Problem: Safety - Adult  Goal: Free from fall injury  07/04/2022 2245 by Marita Snellen, RN  Outcome: Progressing  07/04/2022 0958 by Reed Breech, RN  Outcome: Progressing     Problem: Discharge Planning  Goal: Discharge to home or other facility with appropriate resources  07/04/2022 2245 by Marita Snellen, RN  Outcome: Progressing  Flowsheets (Taken 07/04/2022 2000)  Discharge to home or other facility with appropriate resources:   Identify barriers to discharge with patient and caregiver   Arrange for needed discharge resources and transportation as appropriate  07/04/2022 0958 by Reed Breech, RN  Outcome: Progressing  Flowsheets (Taken 07/03/2022 2026 by Marita Snellen, RN)  Discharge to home or other facility with appropriate resources:   Identify barriers to discharge with patient and caregiver   Arrange for needed discharge resources and transportation as appropriate     Problem: Chronic Conditions and Co-morbidities  Goal: Patient's chronic conditions and co-morbidity symptoms are monitored and maintained or improved  07/04/2022 2245 by Marita Snellen, RN  Outcome: Progressing  Flowsheets (Taken 07/04/2022 2000)  Care Plan - Patient's Chronic Conditions and Co-Morbidity Symptoms are Monitored and Maintained or Improved: Monitor and assess patient's chronic conditions and comorbid symptoms for stability, deterioration, or improvement  07/04/2022 0958 by Reed Breech, RN  Outcome: Progressing  Flowsheets (Taken 07/03/2022 2026 by Marita Snellen, RN)  Care Plan - Patient's Chronic Conditions and Co-Morbidity Symptoms are Monitored and Maintained or Improved: Monitor and assess patient's chronic conditions and comorbid symptoms for stability, deterioration, or improvement     Problem: Cardiovascular - Adult  Goal: Maintains optimal cardiac output and hemodynamic stability  Outcome: Progressing  Flowsheets (Taken 07/04/2022 2000)  Maintains optimal cardiac output and hemodynamic stability:   Monitor blood pressure and heart rate   Monitor urine output and notify  Licensed Independent Practitioner for values outside of normal range   Assess for signs of decreased cardiac output  Goal: Absence of cardiac dysrhythmias or at baseline  Outcome: Progressing  Flowsheets (Taken 07/04/2022 2000)  Absence of cardiac dysrhythmias or at baseline:   Monitor cardiac rate and rhythm   Assess for signs of decreased cardiac output   Administer antiarrhythmia medication and electrolyte replacement as ordered     Problem: Gastrointestinal - Adult  Goal: Maintains or returns to baseline bowel function  Outcome: Progressing  Flowsheets (Taken 07/04/2022 2000)  Maintains or returns to baseline bowel function:   Assess bowel function   Encourage oral fluids to ensure adequate hydration  Goal: Maintains adequate nutritional intake  Outcome: Progressing  Flowsheets (Taken 07/04/2022 2000)  Maintains adequate nutritional intake:   Monitor percentage of each meal consumed   Identify factors contributing to decreased intake, treat as appropriate   Assist with meals as needed     Problem: Metabolic/Fluid and Electrolytes - Adult  Goal: Electrolytes maintained within normal limits  Outcome: Progressing  Flowsheets (Taken 07/04/2022 2000)  Electrolytes maintained within normal limits:   Monitor labs and assess patient for signs and symptoms of electrolyte imbalances   Administer electrolyte replacement as ordered   Monitor response to electrolyte replacements, including repeat lab results as appropriate  Goal: Hemodynamic stability and optimal renal function maintained  Outcome: Progressing  Flowsheets (Taken 07/04/2022 2000)  Hemodynamic stability and optimal renal function maintained:   Monitor labs and assess for signs and symptoms of volume excess or deficit   Monitor intake, output and patient weight   Monitor urine specific gravity, serum osmolarity and serum sodium as indicated or ordered  Goal: Glucose maintained within prescribed range  Outcome: Progressing  Flowsheets (Taken 07/04/2022 2000)  Glucose  maintained within prescribed range:   Monitor blood glucose as ordered   Assess for signs and symptoms of hyperglycemia and hypoglycemia   Administer ordered medications to maintain glucose within target range   Assess barriers to adequate nutritional intake and initiate nutrition consult as needed     Problem: Skin/Tissue Integrity - Adult  Goal: Skin integrity remains intact  Outcome: Progressing  Flowsheets (Taken 07/04/2022 2000)  Skin Integrity Remains Intact:   Monitor for areas of redness and/or skin breakdown   Assess vascular access sites hourly  Goal: Incisions, wounds, or drain sites healing without S/S of infection  Outcome: Progressing  Flowsheets (Taken 07/04/2022 2000)  Incisions, Wounds, or Drain Sites Healing Without Sign and Symptoms of Infection: ADMISSION and DAILY: Assess and document risk factors for pressure ulcer development  Goal: Oral mucous membranes remain intact  Outcome: Progressing  Flowsheets (Taken 07/04/2022 2000)  Oral Mucous Membranes Remain Intact:   Assess oral mucosa and hygiene practices   Implement preventative oral hygiene regimen   Implement oral medicated treatments as ordered     Problem: Skin/Tissue Integrity  Goal: Absence of new skin breakdown  Description: 1.  Monitor for areas of redness and/or skin breakdown  2.  Assess vascular access sites hourly  3.  Every 4-6 hours minimum:  Change oxygen saturation probe site  4.  Every 4-6 hours:  If on nasal continuous positive airway pressure, respiratory therapy assess nares and determine need for appliance change or resting period.  Outcome: Progressing

## 2022-07-04 NOTE — Progress Notes (Addendum)
0700: Bedside and Verbal shift change report given to Irving Burton, Charity fundraiser (oncoming nurse) by Rhae Hammock, RN (offgoing nurse). Report included the following information Nurse Handoff Report, Index, Intake/Output, MAR, and Recent Results.      1700: Wound care preformed     Patient turned q2 throughout shift     Braden Score 12. All of the following interventions have been implemented to prevent pressure injury:    SKIN ASSESSMENT (S)  Dual skin assessment completed at shift change: Yes  Name of second RN who completed Dual Skin Assessment: Tee, RN  Picture of wound uploaded to EMR: Yes  Venelex ordered and given per protocol: Yes  Wound care consulted if wounds present: Yes    SURFACE (S)  Stryker air pump or specialty bed ordered: Yes  Type of bed: air bed  Only white chux used with specialty surfaces (no green chux used): Yes  Waffle cushion used for chair positioning: NA    KEEP MOVING (K)  Mobility status (Bedrest, Chairbound, UWA x 1 assist , 2 assist, Max assist): bedrest   Q2 hour turns documented: Yes  Refusals to turn and education provided documented: NA  Device used to float heels: heel lift and pillow  PT/OT consulted: No    INCONTINENCE (I)  Incontinence status assessed Q2 hours: Yes  External catheter in use: oliguric   Barrier cream in use: venelex and zinc    NUTRITION (N)  I/O's documented every 8 hours: Yes  Oral supplements ordered if appropriate: Yes  Nutrition services consulted: Yes    All concerns about new DTI's must be escalated directly to attending MD, charge nurse, CCL and nurse director.      End of Shift Note    Bedside shift change report given to RN (oncoming nurse) by Reed Breech, RN (offgoing nurse).  Report included the following information SBAR, Kardex, Intake/Output, MAR, and Recent Results    Shift worked:  0700-1900     Shift summary and any significant changes:    See above      Concerns for physician to address: N/a     Zone phone for oncoming shift:       Activity:     Number times ambulated  in hallways past shift: 0  Number of times OOB to chair past shift: 0    Cardiac:   Cardiac Monitoring: Yes           Access:  Current line(s): PIV     Genitourinary:   Urinary status: oliguric    Respiratory:      Chronic home O2 use?: NO  Incentive spirometer at bedside: N/A       GI:     Current diet:  ADULT DIET; Dysphagia - Soft and Bite Sized; 5 carb choices (75 gm/meal)  ADULT ORAL NUTRITION SUPPLEMENT; Breakfast, Lunch, Dinner; Renal Oral Supplement  Passing flatus: YES  Tolerating current diet: YES       Pain Management:   Patient states pain is manageable on current regimen: YES    Skin:     Interventions: turn team, specialty bed, float heels, increase time out of bed, foam dressing, PT/OT consult, limit briefs, internal/external urinary devices, and nutritional support    Patient Safety:  Fall Score:    Interventions: bed/chair alarm, assistive device (walker, cane. etc), gripper socks, pt to call before getting OOB, and stay with me (per policy)       Length of Stay:  Expected LOS: 37  Actual LOS: 33  Atlee Abide, RN

## 2022-07-05 LAB — POCT GLUCOSE
POC Glucose: 109 mg/dL (ref 65–117)
POC Glucose: 103 mg/dL (ref 65–117)
POC Glucose: 98 mg/dL (ref 65–117)
POC Glucose: 148 mg/dL — ABNORMAL HIGH (ref 65–117)

## 2022-07-05 MED FILL — RISAQUAD-2 PO CAPS: ORAL | Qty: 1

## 2022-07-05 MED FILL — METRONIDAZOLE 500 MG/100ML IV SOLN: 500 MG/100ML | INTRAVENOUS | Qty: 100

## 2022-07-05 MED FILL — HEPARIN SODIUM (PORCINE) 5000 UNIT/ML IJ SOLN: 5000 UNIT/ML | INTRAMUSCULAR | Qty: 1

## 2022-07-05 MED FILL — THERA M PLUS PO TABS: ORAL | Qty: 1

## 2022-07-05 MED FILL — ONDANSETRON HCL 4 MG/2ML IJ SOLN: 4 MG/2ML | INTRAMUSCULAR | Qty: 2

## 2022-07-05 MED FILL — CEFTRIAXONE SODIUM 2 G IJ SOLR: 2 g | INTRAMUSCULAR | Qty: 2000

## 2022-07-05 MED FILL — AMIODARONE HCL 200 MG PO TABS: 200 MG | ORAL | Qty: 1

## 2022-07-05 NOTE — Progress Notes (Addendum)
1930: Bedside report received from Marianne/Imani, RN    2325: 1L NC applied, pt appears to have sleep apnea and will drop his oxygen sats to 70%. When he wakes up, he rebounds to mid 90s    [phone]: Spoke with dialysis nurse, plan to be first up this morning.    0700: Bedside report given to [place], [employer]

## 2022-07-05 NOTE — Progress Notes (Addendum)
1910: Bedside shift change report given to Rhae Hammock, Charity fundraiser (Cabin crew) by Irving Burton, RN (offgoing nurse). Report included the following information Nurse Handoff Report, Adult Overview, Intake/Output, MAR, and Quality Measures.     2345: Pt had large BM, incontinence care, wound care, & CHG bath completed with  x 2 assist. Pt tolerated interventions well.    Pt was turned & repositioned every 2 hours with heels elevated off bed at all times.    Braden Score 12. All of the following interventions have been implemented to prevent pressure injury:    SKIN ASSESSMENT (S)  Dual skin assessment completed at shift change: Yes  Name of second RN who completed Dual Skin Assessment: Irving Burton, RN  Picture of wound uploaded to EMR: Yes  Venelex ordered and given per protocol: Yes  Wound care consulted if wounds present: Yes    SURFACE (S)  Stryker air pump or specialty bed ordered: Yes  Type of bed: air bed with pump  Only white chux used with specialty surfaces (no green chux used): Yes  Waffle cushion used for chair positioning: NA    KEEP MOVING (K)  Mobility status (Bedrest, Chairbound, UWA x 1 assist , 2 assist, Max assist): bedrest, max assist  Q2 hour turns documented: Yes  Refusals to turn and education provided documented: NA  Device used to float heels: heel lift & pillows  PT/OT consulted: Yes    INCONTINENCE (I)  Incontinence status assessed Q2 hours: Yes  External catheter in use: oliguric  Barrier cream in use: venelex & zinc cream    NUTRITION (N)  I/O's documented every 8 hours: Yes  Oral supplements ordered if appropriate: Yes  Nutrition services consulted: Yes    All concerns about new DTI's must be escalated directly to attending MD, charge nurse, CCL and nurse director.       End of Shift Note    Bedside shift change report given to Sallyanne Kuster, Charity fundraiser (Cabin crew) by Marita Snellen, RN (offgoing nurse).  Report included the following information SBAR, Intake/Output, Accordion, Cardiac Rhythm NSR, and Quality Measures    Shift  worked:  1900-0730     Shift summary and any significant changes:     N/A     Concerns for physician to address:  N/A     Zone phone for oncoming shift:   [phone]       Activity:     Number times ambulated in hallways past shift: 0  Number of times OOB to chair past shift: 0    Cardiac:   Cardiac Monitoring: Yes           Access:  Current line(s): PIV and HD access     Genitourinary:   Urinary status: oliguric    Respiratory:      Chronic home O2 use?: N/A  Incentive spirometer at bedside: N/A       GI:     Current diet:  ADULT DIET; Dysphagia - Soft and Bite Sized; 5 carb choices (75 gm/meal)  ADULT ORAL NUTRITION SUPPLEMENT; Breakfast, Lunch, Dinner; Renal Oral Supplement  Passing flatus: YES  Tolerating current diet: YES       Pain Management:   Patient states pain is manageable on current regimen: N/A    Skin:     Interventions: specialty bed, float heels, foam dressing, PT/OT consult, limit briefs, and nutritional support    Patient Safety:  Fall Score:    Interventions: bed/chair alarm, pt is bedrest and does not try to get out  of bed.       Length of Stay:  Expected LOS: 37  Actual LOS: 34      Marita Snellen, RN

## 2022-07-05 NOTE — Progress Notes (Signed)
Hospitalist Progress Note    NAME:   Jaime [name]   DOB: [birth year]   MRN: [id number]     Date/Time: 07/05/2022 12:00 PM  Patient PCP: No primary care provider on file.    Estimated discharge date:9/2  Barriers: Stable for discharge snf placement , hd set up at rehab       For reference :   76 y/m with prolonged hospitalization, was admitted to surgical service on 7/31 for perforated hollow viscus underwent emergent laparotomy/ septic shock d/t peritonitis, was intubated and extubated x 2. Also had AKI needing CVVH, now on HD. Also has bilateral foot gangrene. Was transferred to hospitalist service on 8/21.      Assessment / Plan:  #Septic shock- resolved   D/t perforated viscus  S/p Laparotomy 7/31, with wound vac drainage   Liver abscess s/p IR drainage  Bacteremia   Final ID recommendation to continue:  -Ceftriaxone 2G   IV daily x 6 weeks end date 9/15  - Flagyl 500 mg po bid end date 9/8  - Pt will require repeat imaging of liver before conclusion of therapy  -Discussed with case management regarding discharge disposition - wife needs to set up financial assistance for transfer of patient to [place] . Other option could be to go to an SNF in [place] [facility] ) .  Continue current management        #Acute hypoxemic respiratory failure:  Intubated 7/31, extubation 8/7  -Reintubated 8/13, extubated on 8/19  -Currently on 2 liters NC  -Weaned off o2      #Dysphagia:  -Ongoing SLP evaluation-patient tolerating small diet .   -Aspiration on FEES study  -Follow-up SLP evaluation     #Acute kidney injury:  -Now on HD  -Continue with HD  -Nephrology following-planned for permcath in am tomorrow .   -Case management informed the need to set up HD at SNF prior to discharge .  -Will follow up cmp .     #Bilateral lower extremity critical limb ischemia  Gangrene both feet  -Podiatry and vascular surgery following  Cleared by podiatry for demarcation+ autoamputation    #Diabetes mellitus:  -Sliding scale and lantus  -Monitor  FS  -Diabetes team following     #Sacral wound:  -Wound care following       Medical Decision Making:   I personally reviewed labs:na   I personally reviewed imaging: na   I personally reviewed EKG: None  Toxic drug monitoring: Monitor creatinine with abx  Discussed case with: RN, patient     Code Status: Full  DVT Prophylaxis: heparin  GI Prophylaxis:none   Subjective:     Chief Complaint / Reason for Physician Visit  Labs and charts reviewed and patient examined . Patient appeared comfortable and in no distress .  Patient seen and examined today nurse trying to get lab sample but patient is a difficult stick, patient tried to do exercises in bed at least to move his hands and feet   he feels tired today         Objective:     VITALS:   Last 24hrs VS reviewed since prior progress note. Most recent are:  Patient Vitals for the past 24 hrs:   BP Temp Temp src Pulse Resp SpO2   07/05/22 1115 (!) 141/74 97.5 F (36.4 C) Axillary 86 17 93 %   07/05/22 0835 (!) 149/85 97.9 F (36.6 C) Axillary 86 20 97 %   07/05/22 0421 (!) 140/69 98.2  F (36.8 C) Axillary 88 20 95 %   07/04/22 2300 135/72 98.6 F (37 C) Axillary 91 18 --   07/04/22 2000 (!) 142/67 98.5 F (36.9 C) Axillary 89 20 97 %   07/04/22 1551 120/78 98.5 F (36.9 C) Oral 94 20 98 %           Intake/Output Summary (Last 24 hours) at 07/05/2022 1200  Last data filed at 07/04/2022 2346  Gross per 24 hour   Intake 738.33 ml   Output 10 ml   Net 728.33 ml          I had a face to face encounter and independently examined this patient on 07/05/2022, as outlined below:  PHYSICAL EXAM:  General:          Alert, cooperative  EENT:              EOMI. Anicteric sclerae.Rt. hd catn - jugular   Resp:               CTA bilaterally, no wheezing or rales.  No accessory muscle use  CV:                  Regular  rhythm,  No edema, AS murmur   GI:                   Soft, Non distended,Mildly tender.  +Bowel sounds, wound vac in place , drain on rt upper quadrant - purulent material    Neurologic:       Alert and oriented X 3, normal speech,   Psych:   Good insight. Not anxious nor agitated  Skin:                No rashes.  No jaundice  Foot :  bilateral feet - gangrene of first second and third toes , not yet full demarcation           Reviewed most current lab test results and cultures  YES  Reviewed most current radiology test results   YES  Review and summation of old records today    NO  Reviewed patient's current orders and MAR    YES  PMH/SH reviewed - no change compared to H&P  ________________________________________________________________________  Care Plan discussed with:    Comments   Patient x    Family      RN x    Care Manager     Consultant                        Multidiciplinary team rounds were held today with case manager, nursing, pharmacist and Higher education careers adviser.  Patient's plan of care was discussed; medications were reviewed and discharge planning was addressed.     ________________________________________________________________________  Total NON critical care TIME:  53  Minutes    Total CRITICAL CARE TIME Spent:   Minutes non procedure based      Comments   >50% of visit spent in counseling and coordination of care     ________________________________________________________________________  Consepcion Hearing, MD     Procedures: see electronic medical records for all procedures/Xrays and details which were not copied into this note but were reviewed prior to creation of Plan.      LABS:  I reviewed today's most current labs and imaging studies.  Pertinent labs include:  Recent Labs     07/03/22  0912   WBC 11.6*   HGB 7.6*   HCT  24.5*   PLT 189       Recent Labs     07/03/22  0912 07/04/22  0345   NA 136 137   K 4.4 4.0   CL 102 102   CO2 27 29   GLUCOSE 129* 125*   BUN 68* 41*   CREATININE 5.29* 3.63*   CALCIUM 8.3* 8.4*   MG 2.5*  --    PHOS 5.1*  --    LABALBU 1.8*  --    BILITOT 1.1*  --    AST 36  --    ALT 19  --          Signed: Consepcion Hearing, MD

## 2022-07-05 NOTE — Plan of Care (Signed)
Problem: Safety - Adult  Goal: Free from fall injury  Outcome: Progressing     Problem: Respiratory - Adult  Goal: Achieves optimal ventilation and oxygenation  Outcome: Progressing     Problem: Pain  Goal: Verbalizes/displays adequate comfort level or baseline comfort level  Outcome: Progressing  Flowsheets (Taken 07/05/2022 0421 by Marita Snellen, RN)  Verbalizes/displays adequate comfort level or baseline comfort level:   Encourage patient to monitor pain and request assistance   Assess pain using appropriate pain scale     Problem: Discharge Planning  Goal: Discharge to home or other facility with appropriate resources  Outcome: Progressing     Problem: Chronic Conditions and Co-morbidities  Goal: Patient's chronic conditions and co-morbidity symptoms are monitored and maintained or improved  Outcome: Progressing     Problem: Neurosensory - Adult  Goal: Achieves maximal functionality and self care  Outcome: Progressing     Problem: Cardiovascular - Adult  Goal: Maintains optimal cardiac output and hemodynamic stability  Outcome: Progressing     Problem: Gastrointestinal - Adult  Goal: Maintains or returns to baseline bowel function  Outcome: Progressing     Problem: Genitourinary - Adult  Goal: Absence of urinary retention  Outcome: Progressing     Problem: Metabolic/Fluid and Electrolytes - Adult  Goal: Electrolytes maintained within normal limits  Outcome: Progressing  Goal: Hemodynamic stability and optimal renal function maintained  Outcome: Progressing  Goal: Glucose maintained within prescribed range  Outcome: Progressing     Problem: Skin/Tissue Integrity - Adult  Goal: Skin integrity remains intact  Outcome: Progressing  Goal: Incisions, wounds, or drain sites healing without S/S of infection  Outcome: Progressing  Goal: Oral mucous membranes remain intact  Outcome: Progressing     Problem: Hematologic - Adult  Goal: Maintains hematologic stability  Outcome: Progressing     Problem: Musculoskeletal -  Adult  Goal: Return mobility to safest level of function  Outcome: Progressing  Goal: Maintain proper alignment of affected body part  Outcome: Progressing  Goal: Return ADL status to a safe level of function  Outcome: Progressing     Problem: Skin/Tissue Integrity  Goal: Absence of new skin breakdown  Description: 1.  Monitor for areas of redness and/or skin breakdown  2.  Assess vascular access sites hourly  3.  Every 4-6 hours minimum:  Change oxygen saturation probe site  4.  Every 4-6 hours:  If on nasal continuous positive airway pressure, respiratory therapy assess nares and determine need for appliance change or resting period.  Outcome: Progressing     Problem: Safety - Medical Restraint  Goal: Remains free of injury from restraints (Restraint for Interference with Medical Device)  Description: INTERVENTIONS:  1. Determine that other, less restrictive measures have been tried or would not be effective before applying the restraint  2. Evaluate the patient's condition at the time of restraint application  3. Inform patient/family regarding the reason for restraint  4. Q2H: Monitor safety, psychosocial status, comfort, nutrition and hydration  Outcome: Progressing

## 2022-07-05 NOTE — Progress Notes (Addendum)
1610: Bedside and Verbal shift change report given to Clerance Lav RN (oncoming nurse) by Downing Rochester (offgoing nurse). Report included the following information Nurse Handoff Report, Adult Overview, MAR, Recent Results, and Cardiac Rhythm NSR .     0945: Patient states he feels dizzy and nauseous. Given PRN Zofran.     Braden Score 12. All of the following interventions have been implemented to prevent pressure injury:    SKIN ASSESSMENT (S)  Dual skin assessment completed at shift change: Yes  Name of second RN who completed Dual Skin Assessment: Pricilla Riffle, RN  Picture of wound uploaded to EMR: Yes  Venelex ordered and given per protocol: Yes  Wound care consulted if wounds present: Yes    SURFACE (S)  Stryker air pump or specialty bed ordered: Yes  Type of bed: Stryker  Only white chux used with specialty surfaces (no green chux used): Yes  Waffle cushion used for chair positioning: NA    KEEP MOVING (K)  Mobility status (Bedrest, Chairbound, UWA x 1 assist , 2 assist, Max assist): 2 assist   Q2 hour turns documented: Yes  Refusals to turn and education provided documented: NA  Device used to float heels: heel float  PT/OT consulted: Yes    INCONTINENCE (I)  Incontinence status assessed Q2 hours: Yes  External catheter in use: NA: anuric, on dialysis   Barrier cream in use: NO    NUTRITION (N)  I/O's documented every 8 hours: Yes  Oral supplements ordered if appropriate: Yes  Nutrition services consulted: Yes    All concerns about new DTI's must be escalated directly to attending MD, charge nurse, CCL and nurse director.     1000: Accordian drain flushed with 48mL of sterile water.     End of Shift Note    Bedside shift change report given to Huntley Dec RN (oncoming nurse) by Juliann Pares, RN (offgoing nurse).  Report included the following information SBAR, Intake/Output, MAR, Accordion, and Cardiac Rhythm NSR    Shift worked:  7a-7p     Shift summary and any significant changes:    See notes above     Concerns for  physician to address:  N/A     Zone phone for oncoming shift:          Activity:     Number times ambulated in hallways past shift: 0  Number of times OOB to chair past shift: 0    Cardiac:   Cardiac Monitoring: Yes           Access:  Current line(s): PIV and port     Genitourinary:   Urinary status: oliguric    Respiratory:      Chronic home O2 use?: NO  Incentive spirometer at bedside: YES       GI:     Current diet:  ADULT ORAL NUTRITION SUPPLEMENT; Breakfast, Lunch, Dinner; Renal Oral Supplement  ADULT DIET; Dysphagia - Soft and Bite Sized; 5 carb choices (75 gm/meal); please send vanilla pudding with every meal  DIET ONE TIME MESSAGE;  DIET ONE TIME MESSAGE;  Passing flatus: YES  Tolerating current diet: YES       Pain Management:   Patient states pain is manageable on current regimen: YES    Skin:     Interventions: specialty bed, float heels, foam dressing, PT/OT consult, and limit briefs    Patient Safety:  Fall Score:    Interventions: Bed bound        Length of Stay:  Expected  LOS: 37  Actual LOS: 34      Juliann Pares, RN

## 2022-07-05 NOTE — Progress Notes (Addendum)
I have reviewed and am in agreement with the assessment and documentation as performed by my orientee, Haynes Dage, RN. Please refer to Marianne's progress note for update on pt's daily events.     1019 Attempted to collect lab draw on patient. Patient very difficult stick, unsuccessful. Yousif MD at bedside and made aware

## 2022-07-06 LAB — HEPATIC FUNCTION PANEL
ALT: 19 U/L (ref 12–78)
AST: 36 U/L (ref 15–37)
Albumin/Globulin Ratio: 0.4 — ABNORMAL LOW (ref 1.1–2.2)
Albumin: 1.9 g/dL — ABNORMAL LOW (ref 3.5–5.0)
Alk Phosphatase: 271 U/L — ABNORMAL HIGH (ref 45–117)
Bilirubin, Direct: 0.7 MG/DL — ABNORMAL HIGH (ref 0.0–0.2)
Globulin: 5.2 g/dL — ABNORMAL HIGH (ref 2.0–4.0)
Total Bilirubin: 1 MG/DL (ref 0.2–1.0)
Total Protein: 7.1 g/dL (ref 6.4–8.2)

## 2022-07-06 LAB — POCT GLUCOSE
POC Glucose: 125 mg/dL — ABNORMAL HIGH (ref 65–117)
POC Glucose: 113 mg/dL (ref 65–117)
POC Glucose: 114 mg/dL (ref 65–117)
POC Glucose: 95 mg/dL (ref 65–117)
POC Glucose: 95 mg/dL (ref 65–117)

## 2022-07-06 LAB — CBC
Hematocrit: 25.8 % — ABNORMAL LOW (ref 36.6–50.3)
Hemoglobin: 8 g/dL — ABNORMAL LOW (ref 12.1–17.0)
MCH: 32.4 PG (ref 26.0–34.0)
MCHC: 31 g/dL (ref 30.0–36.5)
MCV: 104.5 FL — ABNORMAL HIGH (ref 80.0–99.0)
MPV: 10.4 FL (ref 8.9–12.9)
Nucleated RBCs: 0 PER 100 WBC
Platelets: 212 10*3/uL (ref 150–400)
RBC: 2.47 M/uL — ABNORMAL LOW (ref 4.10–5.70)
RDW: 17.4 % — ABNORMAL HIGH (ref 11.5–14.5)
WBC: 10.7 10*3/uL (ref 4.1–11.1)
nRBC: 0 10*3/uL (ref 0.00–0.01)

## 2022-07-06 LAB — BASIC METABOLIC PANEL
Anion Gap: 8 mmol/L (ref 5–15)
BUN: 61 MG/DL — ABNORMAL HIGH (ref 6–20)
Bun/Cre Ratio: 12 (ref 12–20)
CO2: 26 mmol/L (ref 21–32)
Calcium: 8.7 MG/DL (ref 8.5–10.1)
Chloride: 104 mmol/L (ref 97–108)
Creatinine: 5.23 MG/DL — ABNORMAL HIGH (ref 0.70–1.30)
Est, Glom Filt Rate: 11 mL/min/{1.73_m2} — ABNORMAL LOW (ref >60)
Glucose: 121 mg/dL — ABNORMAL HIGH (ref 65–100)
Potassium: 5.3 mmol/L — ABNORMAL HIGH (ref 3.5–5.1)
Sodium: 138 mmol/L (ref 136–145)

## 2022-07-06 LAB — MAGNESIUM: Magnesium: 2.7 mg/dL — ABNORMAL HIGH (ref 1.6–2.4)

## 2022-07-06 LAB — PHOSPHORUS: Phosphorus: 6.8 MG/DL — ABNORMAL HIGH (ref 2.6–4.7)

## 2022-07-06 MED ORDER — SODIUM ZIRCONIUM CYCLOSILICATE 10 G PO PACK
10 g | Freq: Once | ORAL | Status: DC
Start: 2022-07-06 — End: 2022-07-06

## 2022-07-06 MED ORDER — SODIUM ZIRCONIUM CYCLOSILICATE 5 G PO PACK
5 g | Freq: Once | ORAL | Status: AC
Start: 2022-07-06 — End: 2022-07-06
  Administered 2022-07-06: 16:00:00 5 g via ORAL

## 2022-07-06 MED ORDER — HEPARIN (PORCINE) 1000 UNITS/ML INJECTION FOR DIALYSIS CATHETER LOCK
1000 UNIT/ML | INTRAMUSCULAR | Status: AC | PRN
Start: 2022-07-06 — End: 2022-08-03
  Administered 2022-07-06 – 2022-07-31 (×9): 1800 [IU]

## 2022-07-06 MED ORDER — ALBUMIN HUMAN 25 % IV SOLN
25 % | INTRAVENOUS | Status: AC
Start: 2022-07-06 — End: 2022-07-06
  Administered 2022-07-06: 14:00:00 25 via INTRAVENOUS

## 2022-07-06 MED ORDER — HEPARIN SODIUM (PORCINE) 1000 UNIT/ML IJ SOLN
1000 UNIT/ML | INTRAMUSCULAR | Status: AC
Start: 2022-07-06 — End: 2022-07-06
  Administered 2022-07-06: 15:00:00 1800

## 2022-07-06 MED ORDER — HEPARIN (PORCINE) 1000 UNITS/ML INJECTION FOR DIALYSIS CATHETER LOCK
1000 UNIT/ML | INTRAMUSCULAR | Status: AC | PRN
Start: 2022-07-06 — End: 2022-08-03
  Administered 2022-07-15 – 2022-07-29 (×4): 1800 [IU]

## 2022-07-06 MED FILL — ALBUTEIN 25 % IV SOLN: 25 % | INTRAVENOUS | Qty: 200

## 2022-07-06 MED FILL — HEPARIN SODIUM (PORCINE) 5000 UNIT/ML IJ SOLN: 5000 UNIT/ML | INTRAMUSCULAR | Qty: 1

## 2022-07-06 MED FILL — METRONIDAZOLE 500 MG/100ML IV SOLN: 500 MG/100ML | INTRAVENOUS | Qty: 100

## 2022-07-06 MED FILL — THERA M PLUS PO TABS: ORAL | Qty: 1

## 2022-07-06 MED FILL — CEFTRIAXONE SODIUM 2 G IJ SOLR: 2 g | INTRAMUSCULAR | Qty: 2000

## 2022-07-06 MED FILL — LOKELMA 5 G PO PACK: 5 g | ORAL | Qty: 1

## 2022-07-06 MED FILL — AMIODARONE HCL 200 MG PO TABS: 200 MG | ORAL | Qty: 1

## 2022-07-06 MED FILL — HEPARIN SODIUM (PORCINE) 1000 UNIT/ML IJ SOLN: 1000 UNIT/ML | INTRAMUSCULAR | Qty: 10

## 2022-07-06 MED FILL — RISAQUAD-2 PO CAPS: ORAL | Qty: 1

## 2022-07-06 MED FILL — RETACRIT 3000 UNIT/ML IJ SOLN: 3000 UNIT/ML | INTRAMUSCULAR | Qty: 2

## 2022-07-06 NOTE — Other (Signed)
Primary RN SBAR: Ashley Jacobs, RN  Patient Education: HD Treatment   Hepatitis B Surface Ag   Date/Time Value Ref Range Status   06/29/2022 09:53 AM <0.10 Index Final     Hep B S Ab   Date/Time Value Ref Range Status   06/29/2022 09:53 AM <3.10 mIU/mL Final      07/06/22 0805   Vital Signs   BP (!) 169/90   Temp 97.6 F (36.4 C)   Pulse 77   Respirations 18   Pain Assessment   Pain Assessment None - Denies Pain   Treatment   Time On 0805   Treatment Goal 3000 ml   Observations & Evaluations   Level of Consciousness 0   Oriented X 4   Heart Rhythm Regular   Respiratory Quality/Effort Unlabored   O2 Device None (Room air)   Bilateral Breath Sounds Clear   Skin Color Pale   Skin Condition/Temp Dry;Warm   Appetite Fair   Abdomen Inspection Soft   Bowel Sounds (All Quadrants) Active   Edema Right lower extremity;Left lower extremity   RLE Edema +1   LLE Edema +1   Technical Checks   Dialysis Machine No. 10   RO Machine Number [id number]   Dialyzer Lot No. [id number]   [place] Lot Number [id number]   All Connections Secure Yes   NS Bag Yes   Saline Line Double Clamped Yes   Dialyzer Revaclear 300   Prime Volume (mL) 200 mL   ICEBOAT I;C;E;B;O;A;T   RO Machine Log Sheet Completed Yes   Machine Alarm Self Test Completed;Passed   Child psychotherapist Function   Extracorporeal Chemical engineer Conductivity 13.8   Manual Ph 7.2   Bleach Test (Neg) Yes   Bath Temperature 98.6 F (37 C)   Treatment Initiation   Dialyze Hours 3   Treatment  Initiation Universal Precautions maintained;Lines secured to patient;Connections secured;Prime given;Venous Parameters set;Arterial Parameters set;IT consultant engaged;Saline line double clamped;Revaclear Dialyzer   Dialysis Bath   K+ (Potassium) 3   Ca+ (Calcium) 2.5   Na+ (Sodium) 140   HCO3 (Bicarb) 35

## 2022-07-06 NOTE — Progress Notes (Signed)
Hospitalist Progress Note    NAME:   Jaime [name]   DOB: [birth year]   MRN: [id number]     Date/Time: 07/06/2022 10:11 AM  Patient PCP: No primary care provider on file.    Estimated discharge date:9/2  Barriers: Stable for discharge snf placement , hd set up at rehab       For reference :   76 y/m with prolonged hospitalization, was admitted to surgical service on 7/31 for perforated hollow viscus underwent emergent laparotomy/ septic shock d/t peritonitis, was intubated and extubated x 2. Also had AKI needing CVVH, now on HD. Also has bilateral foot gangrene. Was transferred to hospitalist service on 8/21.      Assessment / Plan:  #Septic shock- resolved   D/t perforated viscus  S/p Laparotomy 7/31, with wound vac drainage   Liver abscess s/p IR drainage  Bacteremia   Final ID recommendation to continue:  -Ceftriaxone 2G   IV daily x 6 weeks end date 9/15  - Flagyl 500 mg po bid end date 9/8  - Pt will require repeat imaging of liver before conclusion of therapy  -Discussed with case management regarding discharge disposition - wife needs to set up financial assistance for transfer of patient to [place] . Other option could be to go to an SNF in [place] [facility] ) .  Continue current management  Labs Obtained with dialysis today        #Acute hypoxemic respiratory failure:  Intubated 7/31, extubation 8/7  -Reintubated 8/13, extubated on 8/19  -Currently on 2 liters [place]  -Weaned off o2      #Dysphagia:  -Ongoing SLP evaluation-patient tolerating small diet .   -Aspiration on FEES study  -Follow-up SLP evaluation     #Acute kidney injury:  -Now on HD  -Continue with HD  -Nephrology following-planned for permcath in am tomorrow .   -Case management informed the need to set up HD at SNF prior to discharge .  -Will follow up cmp .     #Bilateral lower extremity critical limb ischemia  Gangrene both feet  -Podiatry and vascular surgery following  Cleared by podiatry for demarcation+ autoamputation    #Diabetes  mellitus:  -Sliding scale and lantus  -Monitor FS  -Diabetes team following     #Sacral wound:  -Wound care following       Medical Decision Making:   I personally reviewed labs:na   I personally reviewed imaging: na   I personally reviewed EKG: None  Toxic drug monitoring: Monitor creatinine with abx  Discussed case with: RN, patient     Code Status: Full  DVT Prophylaxis: heparin  GI Prophylaxis:none   Subjective:     Chief Complaint / Reason for Physician Visit  Labs and charts reviewed and patient examined .   No new issues         Objective:     VITALS:   Last 24hrs VS reviewed since prior progress note. Most recent are:  Patient Vitals for the past 24 hrs:   BP Temp Temp src Pulse Resp SpO2 Weight   07/06/22 1000 113/63 -- -- 83 -- -- --   07/06/22 0945 (!) 90/49 -- -- 94 -- -- --   07/06/22 0930 (!) 111/53 -- -- 87 -- -- --   07/06/22 0915 118/61 -- -- 89 -- -- --   07/06/22 0900 131/66 -- -- 79 -- -- --   07/06/22 0845 (!) 118/55 -- -- 82 -- -- --   07/06/22  0830 (!) 142/69 -- -- 83 -- -- --   07/06/22 0815 (!) 165/82 -- -- 76 -- -- --   07/06/22 0805 (!) 169/90 97.6 F (36.4 C) -- 77 18 -- --   07/06/22 0746 (!) 146/81 97.1 F (36.2 C) Axillary 93 18 98 % --   07/06/22 0542 -- -- -- -- -- -- 101.3 kg (223 lb 5.2 oz)   07/06/22 0352 (!) 141/71 97.7 F (36.5 C) Axillary 83 20 -- --   07/05/22 2323 (!) 152/70 98.4 F (36.9 C) Oral 94 18 97 % --   07/05/22 1923 (!) 160/93 -- -- (!) 101 22 94 % --   07/05/22 1442 126/80 98.5 F (36.9 C) Axillary 97 18 92 % --   07/05/22 1115 (!) 141/74 97.5 F (36.4 C) Axillary 86 17 93 % --         No intake or output data in the 24 hours ending 07/06/22 1011       I had a face to face encounter and independently examined this patient on 07/06/2022, as outlined below:  PHYSICAL EXAM:  General:          Alert, cooperative  EENT:              EOMI. Anicteric sclerae.Rt. hd catn - jugular   Resp:               CTA bilaterally, no wheezing or rales.  No accessory muscle  use  CV:                  Regular  rhythm,  No edema, AS murmur   GI:                   Soft, Non distended,Mildly tender.  +Bowel sounds, wound vac in place , drain on rt upper quadrant - purulent material   Neurologic:       Alert and oriented X 3, normal speech,   Psych:   Good insight. Not anxious nor agitated  Skin:                No rashes.  No jaundice  Foot :  bilateral feet - gangrene of first second and third toes , not yet full demarcation           Reviewed most current lab test results and cultures  YES  Reviewed most current radiology test results   YES  Review and summation of old records today    NO  Reviewed patient's current orders and MAR    YES  PMH/SH reviewed - no change compared to H&P  ________________________________________________________________________  Care Plan discussed with:    Comments   Patient x    Family      RN x    Care Manager     Consultant                        Multidiciplinary team rounds were held today with case manager, nursing, pharmacist and Higher education careers adviser.  Patient's plan of care was discussed; medications were reviewed and discharge planning was addressed.     ________________________________________________________________________  Total NON critical care TIME:  53  Minutes    Total CRITICAL CARE TIME Spent:   Minutes non procedure based      Comments   >50% of visit spent in counseling and coordination of care     ________________________________________________________________________  Consepcion Hearing, MD  Procedures: see electronic medical records for all procedures/Xrays and details which were not copied into this note but were reviewed prior to creation of Plan.      LABS:  I reviewed today's most current labs and imaging studies.  Pertinent labs include:  Recent Labs     07/06/22  0417   WBC 10.7   HGB 8.0*   HCT 25.8*   PLT 212       Recent Labs     07/04/22  0345 07/06/22  0417   NA 137 138   K 4.0 5.3*   CL 102 104   CO2 29 26   GLUCOSE 125* 121*    BUN 41* 61*   CREATININE 3.63* 5.23*   CALCIUM 8.4* 8.7   MG  --  2.7*   PHOS  --  6.8*   LABALBU  --  1.9*   BILITOT  --  1.0   AST  --  36   ALT  --  19         Signed: Consepcion Hearing, MD

## 2022-07-06 NOTE — Other (Signed)
07/06/22 1106   Vital Signs   BP (!) 130/56   Temp 97.2 F (36.2 C)   Pulse 88   Respirations 18   Pain Assessment   Pain Assessment None - Denies Pain   Post-Hemodialysis Assessment   Post-Treatment Procedures Blood returned;Catheter capped, clamped and heparinized x 2 ports   Art therapist   Rinseback Volume (ml) 300 ml   Blood Volume Processed (Liters) 66.7 L   Dialyzer Clearance Lightly streaked   Duration of Treatment (minutes) 180 minutes   Hemodialysis Intake (ml) 500 ml   Hemodialysis Output (ml) 3500 ml   NET Removed (ml) 3000   Tolerated Treatment Fair   Bilateral Breath Sounds Clear   Edema Right lower extremity;Left lower extremity   RLE Edema Trace;+1   LLE Edema Trace;+1   Time Off 1106   Patient Disposition Return to room     Primary RN SBAR: Fredric Dine, RN  Comments: Pt tolerated treatment fair. Brief episode of asymptomatic hypotension. Albumin given. See MAR. VSS upon completion of treatment.

## 2022-07-06 NOTE — Progress Notes (Addendum)
0700 Bedside and Verbal shift change report given to [employer] (oncoming nurse) by Clarise Cruz RN (offgoing nurse). Report included the following information Nurse Handoff Report, Index, ED Encounter Summary, ED SBAR, Intake/Output, MAR, Recent Results, and Cardiac Rhythm NSR .     Braden Score 11. All of the following interventions have been implemented to prevent pressure injury:    SKIN ASSESSMENT (S)  Dual skin assessment completed at shift change: Yes  Name of second RN who completed Dual Skin Assessment: Kemi RN  Picture of wound uploaded to EMR: Yes  Venelex ordered and given per protocol: Yes  Wound care consulted if wounds present: Yes    SURFACE (S)  St[employer]ker air pump or specialty bed ordered: Yes  Type of bed: Specialty Bed  Only white chux used with specialty surfaces (no green chux used): Yes  Waffle cushion used for chair positioning: NA    KEEP MOVING (K)  Mobility status (Bedrest, Chairbound, UWA x 1 assist , 2 assist, Max assist): x 2 assist  Q2 hour turns documented: Yes  Refusals to turn and education provided documented: NA  Device used to float heels: heel pillow  PT/OT consulted: Yes    INCONTINENCE (I)  Incontinence status assessed Q2 hours: Yes  External catheter in use: NO  Barrier cream in use: NO    NUTRITION (N)  I/O's documented every 8 hours: Yes  Oral supplements ordered if appropriate: Yes  Nutrition services consulted: Yes    All concerns about new DTI's must be escalated directly to attending MD, charge nurse, Ravinia and nurse director.      0759 Patient off the unit to dialysis suite    1114 patient's K 5.3 with AM labs. Notified Yousif MD    [phone] Patient returned from dialysis. BP 142/80    1510 Flush accordion drain with 10cc normal saline    1811 Patient BG 63 mg/dL, 2nd and 3rd BG recheck 95 mg/dL.     End of Shift Note    Bedside shift change report given to Janus Molder RN (oncoming nurse) by Cherly Anderson, RN (offgoing nurse).  Report included the following information SBAR, Kardex, ED  Summary, Intake/Output, MAR, Recent Results, and Cardiac Rhythm NSR    Shift worked:  7a to 7p     Shift summary and any significant changes:     See above    Patient is a meal assist. Ate approximately 26-50% of meals    Hd today, 3L removed    Patient had 2 BM today    Wound care completed this shift   Concerns for physician to address:  N/A     Zone phone for oncoming shift:           Activity:     Number times ambulated in hallways past shift: 0  Number of times OOB to chair past shift: 0    Cardiac:   Cardiac Monitoring: Yes           Access:  Current line(s): PIV     Genitourinary:   Urinary status: oliguric    Respiratory:      Chronic home O2 use?: NO  Incentive spirometer at bedside: NO       GI:     Current diet:  ADULT ORAL NUTRITION SUPPLEMENT; Breakfast, Lunch, Dinner; Renal Oral Supplement  ADULT DIET; Dysphagia - Soft and Bite Sized; 5 carb choices (75 gm/meal); please send vanilla pudding with every meal  DIET ONE TIME MESSAGE;  DIET ONE  TIME MESSAGE;  Passing flatus: YES  Tolerating current diet: YES       Pain Management:   Patient states pain is manageable on current regimen: YES    Skin:     Interventions: specialty bed, float heels, increase time out of bed, foam dressing, PT/OT consult, limit briefs, internal/external urinary devices, and nutritional support    Patient Safety:  Fall Score:    Interventions: bed/chair alarm, assistive device (walker, cane. etc), gripper socks, pt to call before getting OOB, stay with me (per policy), and gait belt       Length of Stay:  Expected LOS: 37  Actual LOS: [place], RN

## 2022-07-06 NOTE — Plan of Care (Signed)
Problem: Safety - Adult  Goal: Free from fall injury  07/06/2022 1608 by Cherly Anderson, RN  Outcome: Progressing  07/06/2022 1607 by Cherly Anderson, RN  Outcome: Progressing     Problem: Respiratory - Adult  Goal: Achieves optimal ventilation and oxygenation  07/06/2022 1608 by Cherly Anderson, RN  Outcome: Progressing  07/06/2022 1607 by Cherly Anderson, RN  Outcome: Progressing     Problem: Pain  Goal: Verbalizes/displays adequate comfort level or baseline comfort level  07/06/2022 1608 by Cherly Anderson, RN  Outcome: Progressing  07/06/2022 1607 by Cherly Anderson, RN  Outcome: Progressing     Problem: Discharge Planning  Goal: Discharge to home or other facility with appropriate resources  07/06/2022 1608 by Cherly Anderson, RN  Outcome: Progressing  07/06/2022 1607 by Cherly Anderson, RN  Outcome: Progressing     Problem: Chronic Conditions and Co-morbidities  Goal: Patient's chronic conditions and co-morbidity symptoms are monitored and maintained or improved  07/06/2022 1608 by Cherly Anderson, RN  Outcome: Progressing  07/06/2022 1607 by Cherly Anderson, RN  Outcome: Progressing     Problem: Neurosensory - Adult  Goal: Achieves maximal functionality and self care  07/06/2022 1608 by Cherly Anderson, RN  Outcome: Progressing  07/06/2022 1607 by Cherly Anderson, RN  Outcome: Progressing     Problem: Cardiovascular - Adult  Goal: Maintains optimal cardiac output and hemodynamic stability  07/06/2022 1608 by Cherly Anderson, RN  Outcome: Progressing  07/06/2022 1607 by Cherly Anderson, RN  Outcome: Progressing  Goal: Absence of cardiac dysrhythmias or at baseline  07/06/2022 1608 by Cherly Anderson, RN  Outcome: Progressing  07/06/2022 1607 by Cherly Anderson, RN  Outcome: Progressing     Problem: Gastrointestinal - Adult  Goal: Maintains or returns to baseline bowel function  07/06/2022 1608 by Cherly Anderson, RN  Outcome: Progressing  07/06/2022 1607 by Cherly Anderson, RN  Outcome: Progressing  Goal: Maintains adequate nutritional intake  07/06/2022 1608 by Cherly Anderson,  RN  Outcome: Progressing  07/06/2022 1607 by Cherly Anderson, RN  Outcome: Progressing     Problem: Genitourinary - Adult  Goal: Absence of urinary retention  07/06/2022 1608 by Cherly Anderson, RN  Outcome: Progressing  07/06/2022 1607 by Cherly Anderson, RN  Outcome: Progressing     Problem: Metabolic/Fluid and Electrolytes - Adult  Goal: Electrolytes maintained within normal limits  07/06/2022 1608 by Cherly Anderson, RN  Outcome: Progressing  07/06/2022 1607 by Cherly Anderson, RN  Outcome: Progressing  Goal: Hemodynamic stability and optimal renal function maintained  07/06/2022 1608 by Cherly Anderson, RN  Outcome: Progressing  07/06/2022 1607 by Cherly Anderson, RN  Outcome: Progressing  Goal: Glucose maintained within prescribed range  07/06/2022 1608 by Cherly Anderson, RN  Outcome: Progressing  07/06/2022 1607 by Cherly Anderson, RN  Outcome: Progressing     Problem: Skin/Tissue Integrity - Adult  Goal: Skin integrity remains intact  07/06/2022 1608 by Cherly Anderson, RN  Outcome: Progressing  07/06/2022 1607 by Cherly Anderson, RN  Outcome: Progressing  Goal: Incisions, wounds, or drain sites healing without S/S of infection  07/06/2022 1608 by Cherly Anderson, RN  Outcome: Progressing  07/06/2022 1607 by Cherly Anderson, RN  Outcome: Progressing  Goal: Oral mucous membranes remain intact  07/06/2022 1608 by Cherly Anderson, RN  Outcome: Progressing  07/06/2022 1607 by Cherly Anderson, RN  Outcome: Progressing     Problem: Hematologic - Adult  Goal: Maintains hematologic stability  07/06/2022 1608 by Cherly Anderson, RN  Outcome: Progressing  07/06/2022 1607 by Cherly Anderson, RN  Outcome: Progressing     Problem: Musculoskeletal - Adult  Goal: Return mobility to safest level of function  07/06/2022 1608 by Cherly Anderson, RN  Outcome: Progressing  07/06/2022 1607 by Cherly Anderson, RN  Outcome: Progressing  Goal: Maintain proper alignment of affected body part  07/06/2022 1608 by Cherly Anderson, RN  Outcome: Progressing  07/06/2022 1607 by Cherly Anderson, RN  Outcome:  Progressing  Goal: Return ADL status to a safe level of function  07/06/2022 1608 by Cherly Anderson, RN  Outcome: Progressing  07/06/2022 1607 by Cherly Anderson, RN  Outcome: Progressing     Problem: Skin/Tissue Integrity  Goal: Absence of new skin breakdown  Description: 1.  Monitor for areas of redness and/or skin breakdown  2.  Assess vascular access sites hourly  3.  Every 4-6 hours minimum:  Change oxygen saturation probe site  4.  Every 4-6 hours:  If on nasal continuous positive airway pressure, respiratory therapy assess nares and determine need for appliance change or resting period.  07/06/2022 1608 by Cherly Anderson, RN  Outcome: Progressing  07/06/2022 1607 by Cherly Anderson, RN  Outcome: Progressing     Problem: ABCDS Injury Assessment  Goal: Absence of physical injury  07/06/2022 1608 by Cherly Anderson, RN  Outcome: Progressing  07/06/2022 1607 by Cherly Anderson, RN  Outcome: Progressing     Problem: Safety - Medical Restraint  Goal: Remains free of injury from restraints (Restraint for Interference with Medical Device)  Description: INTERVENTIONS:  1. Determine that other, less restrictive measures have been tried or would not be effective before applying the restraint  2. Evaluate the patient's condition at the time of restraint application  3. Inform patient/family regarding the reason for restraint  4. Q2H: Monitor safety, psychosocial status, comfort, nutrition and hydration  07/06/2022 1608 by Cherly Anderson, RN  Outcome: Progressing  07/06/2022 1607 by Cherly Anderson, RN  Outcome: Progressing

## 2022-07-06 NOTE — Progress Notes (Signed)
NAME: Jaime [name]        DOB:  [birth year]        MRN:  [id number]                     Assessment   :                                               Plan:  AKI  Pancreatitis  Hyponatremia  DM  Anemia  Toe ischemia KRT initiated 7/31; now on MWF schedule; perm cath 8/30;  HD today- got 3 kg UF  Switched to 2k as mild high K noted    Needs outpatient HD placement under AKI diagnosis - CM working on options (here or back in [place])    Watch for renal recovery, but none so far.    H/H not at goal.  Continue ESA.    D/W pt, RN, HD RN            Subjective:     Chief Complaint:  just returned from HD. Feels good. Got 3 Kg UF  No acute c/o          Review of Systems:    Symptom Y/N Comments  Symptom Y/N Comments   Fever/Chills    Chest Pain     Poor Appetite    Edema     Cough    Abdominal Pain     Sputum    Joint Pain     SOB/DOE    Pruritis/Rash     Nausea/vomit    Tolerating PT/OT     Diarrhea    Tolerating Diet     Constipation    Other       Could not obtain due to:      Objective:     VITALS:   Last 24hrs VS reviewed since prior progress note. Most recent are:  Vitals:    07/06/22 1154   BP: (!) 142/80   Pulse: 88   Resp: 18   Temp: 97.5 F (36.4 C)   SpO2:        Intake/Output Summary (Last 24 hours) at 07/06/2022 1247  Last data filed at 07/06/2022 1106  Gross per 24 hour   Intake 500 ml   Output 3550 ml   Net -3050 ml        Telemetry Reviewed:     PHYSICAL EXAM:  General: NAD  trace edema  R TDC intact  B/l toes- dry gangrene      Lab Data Reviewed: (see below)    Medications Reviewed: (see below)    PMH/SH reviewed - no change compared to H&P  ________________________________________________________________________  Care Plan discussed with:  Patient     Family      RN     Care Manager                    Consultant:          Comments   >50% of visit spent in counseling and coordination of care        ________________________________________________________________________  Pennie Banter, MD     Procedures: see electronic medical records for all procedures/Xrays and details which  were not copied into this note but were reviewed prior to creation of Plan.      LABS:  Recent Labs  07/06/22  0417   WBC 10.7   HGB 8.0*   HCT 25.8*   PLT 212       Recent Labs     07/04/22  0345 07/06/22  0417   NA 137 138   K 4.0 5.3*   CL 102 104   CO2 29 26   BUN 41* 61*   MG  --  2.7*   PHOS  --  6.8*         MEDICATIONS:  Current Facility-Administered Medications   Medication Dose Route Frequency    heparin (porcine) 1000 UNIT/ML injection 1,800 Units  1,800 Units IntraCATHeter PRN    And    heparin (porcine) 1000 UNIT/ML injection 1,800 Units  1,800 Units IntraCATHeter PRN    guaiFENesin (ROBITUSSIN) 100 MG/5ML liquid 200 mg  200 mg Oral Q4H PRN    heparin (porcine) injection 5,000 Units  5,000 Units SubCUTAneous 3 times per day    cefTRIAXone (ROCEPHIN) 2,000 mg in sodium chloride 0.9 % 50 mL IVPB (mini-bag)  2,000 mg IntraVENous Q24H    metronidazole (FLAGYL) 500 mg in 0.9% NaCl 100 mL IVPB premix  500 mg IntraVENous q8h    therapeutic multivitamin-minerals 1 tablet  1 tablet Per NG tube Daily    amiodarone (CORDARONE) tablet 200 mg  200 mg Per NG tube Daily    acidophilus probiotic capsule 1 capsule  1 capsule Oral Daily    lidocaine 2 % injection 20 mL  20 mL IntraDERmal Once    collagenase ointment   Topical Daily    epoetin alfa-epbx (RETACRIT) injection 6,000 Units  6,000 Units SubCUTAneous Once per day on Mon Wed Fri    fentaNYL (SUBLIMAZE) injection 25 mcg  25 mcg IntraVENous Q1H PRN    oxyCODONE (ROXICODONE) immediate release tablet 5 mg  5 mg Oral Q4H PRN    midodrine (PROAMATINE) tablet 5 mg  5 mg Oral Daily PRN    insulin lispro (HUMALOG) injection vial 0-4 Units  0-4 Units SubCUTAneous 4 times per day    bisacodyl (DULCOLAX) suppository 10 mg  10 mg Rectal Daily PRN    albumin human 25% IV solution 25 g  25  g IntraVENous PRN    sodium chloride 0.9 % bolus 100 mL  100 mL IntraVENous PRN    balsum peru-castor oil (VENELEX) ointment   Topical BID    glucose chewable tablet 16 g  4 tablet Oral PRN    dextrose bolus 10% 125 mL  125 mL IntraVENous PRN    Or    dextrose bolus 10% 250 mL  250 mL IntraVENous PRN    glucagon (rDNA) injection 1 mg  1 mg SubCUTAneous PRN    dextrose 10 % infusion   IntraVENous Continuous PRN    sodium chloride flush 0.9 % injection 5-40 mL  5-40 mL IntraVENous 2 times per day    sodium chloride flush 0.9 % injection 5-40 mL  5-40 mL IntraVENous PRN    0.9 % sodium chloride infusion   IntraVENous PRN    acetaminophen (TYLENOL) tablet 650 mg  650 mg Oral Q6H PRN    Or    acetaminophen (TYLENOL) suppository 650 mg  650 mg Rectal Q6H PRN    ondansetron (ZOFRAN) injection 4 mg  4 mg IntraVENous Q6H PRN

## 2022-07-07 ENCOUNTER — Inpatient Hospital Stay: Admit: 2022-07-07 | Payer: MEDICARE

## 2022-07-07 LAB — POCT GLUCOSE
POC Glucose: 109 mg/dL (ref 65–117)
POC Glucose: 90 mg/dL (ref 65–117)
POC Glucose: 127 mg/dL — ABNORMAL HIGH (ref 65–117)
POC Glucose: 119 mg/dL — ABNORMAL HIGH (ref 65–117)

## 2022-07-07 MED ORDER — SEVELAMER CARBONATE 2.4 G PO PACK
2.4 | Freq: Three times a day (TID) | ORAL | Status: DC
Start: 2022-07-07 — End: 2022-08-03
  Administered 2022-07-07 – 2022-08-03 (×61): 2.4 g via ORAL

## 2022-07-07 MED FILL — SEVELAMER CARBONATE 2.4 G PO PACK: 2.4 g | ORAL | Qty: 2.4

## 2022-07-07 MED FILL — METRONIDAZOLE 500 MG/100ML IV SOLN: 500 MG/100ML | INTRAVENOUS | Qty: 100

## 2022-07-07 MED FILL — RISAQUAD-2 PO CAPS: ORAL | Qty: 1

## 2022-07-07 MED FILL — HEPARIN SODIUM (PORCINE) 5000 UNIT/ML IJ SOLN: 5000 UNIT/ML | INTRAMUSCULAR | Qty: 1

## 2022-07-07 MED FILL — RENVELA 2.4 G PO PACK: 2.4 g | ORAL | Qty: 2.4

## 2022-07-07 MED FILL — CEFTRIAXONE SODIUM 2 G IJ SOLR: 2 g | INTRAMUSCULAR | Qty: 2000

## 2022-07-07 MED FILL — THERA M PLUS PO TABS: ORAL | Qty: 1

## 2022-07-07 MED FILL — AMIODARONE HCL 200 MG PO TABS: 200 MG | ORAL | Qty: 1

## 2022-07-07 NOTE — Plan of Care (Addendum)
Problem: Occupational Therapy - Adult  Goal: By Discharge: Performs self-care activities at highest level of function for planned discharge setting.  See evaluation for individualized goals.  Description: FUNCTIONAL STATUS PRIOR TO ADMISSION:     ADL Assistance: Independent, Ambulation Assistance: Independent, Transfer Assistance: Independent, Active Driver: Yes     HOME SUPPORT: Patient lived with spouse and was independent.    Occupational Therapy Goals:  Initiated 06/10/2022; Re-Evaluation 06/20/2022  Weekly Reassessment 06/29/22 - Continue as below. Goal #6 added.  1.  Patient will perform grooming at bed level with Moderate Assist within 7 day(s).  2.  Patient will perform self-feeding with Moderate Assist within 7 day(s).  3.  Patient will perform upper body dressing with Maximal Assist within 7 day(s).  4.  Patient will complete supine>sit with mod assist x2  within 7 day(s).  5.  Patient will tolerate sitting EOB with fair sitting balance in preparation for ADLs within 7 day(s).  6.  Patient will participate in BUE therapeutic activity / exercise with Minimal Assist within 7 day(s).      Outcome: Progressing   OCCUPATIONAL THERAPY TREATMENT  Patient: Jaime [name] (76 y.o. male)  Date: 07/07/2022  Primary Diagnosis: Septicemia (HCC) [A41.9]  Gastric perforation (HCC) [K25.5]  Perforated abdominal viscus [R19.8]  Procedure(s) (LRB):  LAPAROTOMY EXPLORATORY (N/A)  LAPAROSCOPY DIAGNOSTIC (N/A)  ENDOSCOPY OF ILEAL CONDUIT (N/A) 36 Days Post-Op   Precautions: Modified Diet, General Precautions, Contact Precautions, Fall Risk                Chart, occupational therapy assessment, plan of care, and goals were reviewed.    ASSESSMENT  Patient continues to benefit from skilled OT services and is slowly progressing towards goals. Decreased fluid noted B UEs and LEs with all functional bed mobility and UE activity improved with less fluid overload. Patient able to follow all commands consistently; able to tolerate B UEs  at 90 degrees while seated, with UEs resting on bedside table; B functional grip noted which is ++ improvement.             PLAN :  Patient continues to benefit from skilled intervention to address the above impairments.  Continue treatment per established plan of care to address goals.    Recommend with staff: full set up with meals and assist PRN    Recommend next OT session: weekly re-eval; AAROM shoulder flexion in supine, cog screen, meal assist    Recommendation for discharge: (in order for the patient to meet his/her long term goals): Therapy up to 5 days/week in Skilled nursing facility or Therapy 3 hours/day 5-7 days/week pending progress    Other factors to consider for discharge: no local support, not safe to be alone, and from [place].    IF patient discharges home will need the following DME: continuing to assess with progress       SUBJECTIVE:   Patient stated "I feel good sitting up."    OBJECTIVE DATA SUMMARY:   Cognitive/Behavioral Status:  Orientation  Overall Orientation Status: Within Functional Limits  Orientation Level: Oriented X4  Cognition  Overall Cognitive Status: WFL  Arousal/Alertness: Appropriate responses to stimuli  Following Commands: Follows one step commands with increased time    Functional Mobility and Transfers for ADLs:  Bed Mobility:  Bed Mobility Training  Bed Mobility Training: Yes  Overall Level of Assistance: Maximum assistance;Moderate assistance;Assist X2;Additional time;Adaptive equipment (effective grip on bedrail B UE)  Rolling: Maximum assistance;Moderate assistance;Assist X2;Additional time;Adaptive equipment (nice improvement)  Supine to Sit: Maximum assistance;Assist X2;Additional time  Sit to Supine: Maximum assistance;Assist X2  Scooting: Maximum assistance;Assist X2           Balance:  Standing:  (progressed to point where next tx session will be ready for Best Mover attempt at standing)  Balance  Sitting: Impaired  Sitting - Static: Unsupported;Fair  (occasional)  Sitting - Dynamic: Poor (constant support);Fair (occasional) (good improvement)  Standing:  (progressed to point where next tx session will be ready for [employer] attempt at standing)      ADL Intervention:         Feeding: Moderate assistance;Minimal assistance   Feeding Skilled Clinical Factors: improved UE strength allows improved capacity for PO                                                 Pain Rating:  0/10   Pain Intervention(s):   rest, repositioning, and pain is at a level acceptable to the patient      Activity Tolerance:   Fair , requires rest breaks, and SpO2 stable on room air  Please refer to the flowsheet for vital signs taken during this treatment.    After treatment:   Patient left in no apparent distress in bed, Call bell within reach, Heels elevated for pressure relief, and Patient offloaded in partial R side lying for pressure relief    COMMUNICATION/EDUCATION:   The patient's plan of care was discussed with: physical therapist and registered nurse    Patient Education  Education Given To: Patient  Education Provided: Plan of Care;Transfer Training;Energy Conservation;ADL Adaptive Strategies;Fall Prevention Strategies;Home Exercise Program;Precautions  Education Provided Comments: pressure relief  Education Method: Verbal;Demonstration  Barriers to Learning: Cognition;None (noted improvement)  Education Outcome: Verbalized understanding;Demonstrated understanding;Continued education needed    Thank you for this referral.  Willow Ora, OTR/L  Minutes: 39

## 2022-07-07 NOTE — Plan of Care (Signed)
Problem: Safety - Adult  Goal: Free from fall injury  07/07/2022 0828 by Nadara Mustard, RN  Outcome: Progressing  07/07/2022 0410 by Jearld Lesch, RN  Outcome: Progressing     Problem: Respiratory - Adult  Goal: Achieves optimal ventilation and oxygenation  07/07/2022 0410 by Jearld Lesch, RN  Outcome: Progressing     Problem: Pain  Goal: Verbalizes/displays adequate comfort level or baseline comfort level  07/07/2022 0828 by Nadara Mustard, RN  Outcome: Progressing  Flowsheets (Taken 07/07/2022 0730)  Verbalizes/displays adequate comfort level or baseline comfort level: Encourage patient to monitor pain and request assistance  07/07/2022 0410 by Jearld Lesch, RN  Outcome: Progressing     Problem: Discharge Planning  Goal: Discharge to home or other facility with appropriate resources  07/07/2022 0828 by Nadara Mustard, RN  Outcome: Progressing  07/07/2022 0410 by Jearld Lesch, RN  Outcome: Progressing     Problem: Chronic Conditions and Co-morbidities  Goal: Patient's chronic conditions and co-morbidity symptoms are monitored and maintained or improved  07/07/2022 0410 by Jearld Lesch, RN  Outcome: Progressing     Problem: Neurosensory - Adult  Goal: Achieves maximal functionality and self care  07/07/2022 0410 by Jearld Lesch, RN  Outcome: Progressing     Problem: Cardiovascular - Adult  Goal: Maintains optimal cardiac output and hemodynamic stability  07/07/2022 0410 by Jearld Lesch, RN  Outcome: Progressing  Goal: Absence of cardiac dysrhythmias or at baseline  07/07/2022 0410 by Jearld Lesch, RN  Outcome: Progressing     Problem: Gastrointestinal - Adult  Goal: Maintains or returns to baseline bowel function  07/07/2022 0410 by Jearld Lesch, RN  Outcome: Progressing  Goal: Maintains adequate nutritional intake  07/07/2022 0410 by Jearld Lesch, RN  Outcome: Progressing     Problem: Genitourinary - Adult  Goal: Absence of urinary retention  07/07/2022 0410 by Jearld Lesch, RN  Outcome: Progressing     Problem: Metabolic/Fluid  and Electrolytes - Adult  Goal: Electrolytes maintained within normal limits  07/07/2022 0410 by Jearld Lesch, RN  Outcome: Progressing  Goal: Hemodynamic stability and optimal renal function maintained  07/07/2022 0410 by Jearld Lesch, RN  Outcome: Progressing  Goal: Glucose maintained within prescribed range  07/07/2022 0410 by Jearld Lesch, RN  Outcome: Progressing     Problem: Skin/Tissue Integrity - Adult  Goal: Skin integrity remains intact  07/07/2022 0410 by Jearld Lesch, RN  Outcome: Progressing  Flowsheets (Taken 07/06/2022 2003)  Skin Integrity Remains Intact: Monitor for areas of redness and/or skin breakdown  Goal: Incisions, wounds, or drain sites healing without S/S of infection  07/07/2022 0410 by Jearld Lesch, RN  Outcome: Progressing  Goal: Oral mucous membranes remain intact  07/07/2022 0410 by Jearld Lesch, RN  Outcome: Progressing     Problem: Hematologic - Adult  Goal: Maintains hematologic stability  07/07/2022 0410 by Jearld Lesch, RN  Outcome: Progressing     Problem: Musculoskeletal - Adult  Goal: Return mobility to safest level of function  07/07/2022 0410 by Jearld Lesch, RN  Outcome: Progressing  Goal: Maintain proper alignment of affected body part  07/07/2022 0410 by Jearld Lesch, RN  Outcome: Progressing  Goal: Return ADL status to a safe level of function  07/07/2022 0410 by Jearld Lesch, RN  Outcome: Progressing     Problem: Skin/Tissue Integrity  Goal: Absence of new skin breakdown  Description: 1.  Monitor for areas of redness and/or skin breakdown  2.  Assess vascular access sites hourly  3.  Every 4-6 hours minimum:  Change oxygen saturation probe site  4.  Every 4-6 hours:  If on nasal continuous positive airway pressure, respiratory therapy assess nares and determine need for appliance change or resting period.  07/07/2022 0410 by Jearld Lesch, RN  Outcome: Progressing     Problem: ABCDS Injury Assessment  Goal: Absence of physical injury  07/07/2022 0410 by Jearld Lesch, RN  Outcome:  Progressing     Problem: Safety - Medical Restraint  Goal: Remains free of injury from restraints (Restraint for Interference with Medical Device)  Description: INTERVENTIONS:  1. Determine that other, less restrictive measures have been tried or would not be effective before applying the restraint  2. Evaluate the patient's condition at the time of restraint application  3. Inform patient/family regarding the reason for restraint  4. Q2H: Monitor safety, psychosocial status, comfort, nutrition and hydration  07/07/2022 0410 by Jearld Lesch, RN  Outcome: Progressing

## 2022-07-07 NOTE — Progress Notes (Signed)
Infectious Disease Progress    Impression      Hepatic abscess  Gas and fluid containing collection 7.1 x 10.8 x 5.4 cm  In anterior left lobe of liver, patchy areas of hypodensity right lobe  4 x 4.5 x 5.5 cm collection  S/p drainage by IR 8/4.Cultures + for  few E.coli  Repeat CT 8/7 shows reduction in size of hepatic abscess  Plan was for ceftriaxone IV x6 weeks end date 9/15, Flagyl  Until 8/18, now continued due to persistent leukocytosis  Repeat CT 8/13 + for    S/p percutaneous drainage of left hepatic abscess with  Minimal fluid remaining around catheter, suggestion of peripancreatic edema, small bilateral pleural effusions  Repeat CT abdomen pelvis 8/16+ for increased peripancreatic edema suspicious for pancreatitis, small residual fluid collection in left hepatic lobe  Lipase 737        Sepsis  Septic shock.  Resolved    E. coli bacteremia  Blood cultures 7/31+ for E. coli   2/2 LAC (pan sensitive)   Negative repeat cultures 8/4   Treated.    Acute abdomen  Pneumoperitoneum  S/p diagnostic laparoscopy, laparotomy  EGD 7/31  Findings of cloudy fluid in right upper quadrant, yellow/white  Inflammatory peel over lesser curve of stomach, inferior lobe of liver  No gross perforation identified  Pancreas and retroperitoneum appeared edematous  Intra-Op fluid culture + for E. Coli (pansensitive)  Abdominal wound seen, clean    Leukocytosis, persistent   Now resolved wbc 10.7    S/p abdominal wound dehiscence  Clean, no purulence per wound care  Cultures 8/17, 8/18-NG.    Gangrene, discoloration of toes  Persist      Acute hypoxic respiratory failure  Re intubated 8/13, extubated 8/19  Initially intubated 7/31, s/p extubation 8/7  CXR  8/20 + + for atelectasis  Resp cultures  8/13+ for light yeast  Apparent C.albicans/ dubliensis        AKI  Cr 5.23 on HD    Coagulopathy  Improving    Thrombocytopenia  resolved      Transaminitis  Improving    Hyperbilirubinemia   Improved  Bilirubin 1.1    Diabetes type  2  Hyperglycemia  A1c 7.7    Diarrhea  FMS present    Obesity  BMI 34.76  Plan  Continue ceftriaxone 2 g IV daily  Pt will need  a total of  6 weeks of coverage of  liver abscess planned end date 9/15.   Continue Flagyl given complicated history & persistent leucocytosis  Patient would require repeat imaging prior to DC of antimicrobials  IR to re evaluate & change out drainage system or DC  .  Antimicrobial orders for discharge  -Ceftriaxone 2G   IV daily x 6 weeks end date 9/15  - Flagyl 500 mg po bid end date 9/8  - Pt will require repeat imaging of liver before conclusion of therapy  -Weekly CBC, CMP-  -Encourage adequate fluids, daily probiotic/yogurt  -ID follow-up - if pt remains in [place] call [phone]   For follow up.    Possible adverse effects of long term antibiotics are inclusive of but not limited to following  BM suppression, neutropenia , cytopenias , aplastic anemia hemorrhage liver & renal dysfunction/ liver , renal failure  , GI dysfunction- N, V  Diarrhea,C.difficile disease, rash , allergy , anaphylaxis.toxic epidermal necrolysis  Neuro toxicity , seizure disorder  Side effects tend to be more pronounced in the elderly  Pt to report persistent symptoms such as N,V , diarrhea, numbness tingling of extremities  Or any other symptoms    May DC from ID standpoint once evaluated by IR.           Extensive review of chart notes, labs, imaging, cultures done  Additionally review of done:  D/w ICU team, Dr. Salena Saner    Patient seen today.  Awake, talking  Accordion draining greenish thick liquid. Cannot be emptied as whole system needs to be changed pr RN   Abdominal wound seen with wound care. Clean      History reviewed. No pertinent past medical history.    Past Surgical History:   Procedure Laterality Date    BLADDER SURGERY N/A 06/01/2022    ENDOSCOPY OF ILEAL CONDUIT performed by Guinevere Ferrari, MD at [facility]    CT VISCERAL PERCUTANEOUS DRAIN  06/05/2022    CT VISCERAL PERCUTANEOUS DRAIN 06/05/2022  [facility] CT    IR TUNNELED CATHETER PLACEMENT GREATER THAN 5 YEARS  07/01/2022    IR TUNNELED CATHETER PLACEMENT GREATER THAN 5 YEARS 07/01/2022 [facility] [place], APRN - NP [facility] ANGIO IR    LAPAROSCOPY N/A 06/01/2022    LAPAROSCOPY DIAGNOSTIC performed by Guinevere Ferrari, MD at [facility]    LAPAROTOMY N/A 06/01/2022    LAPAROTOMY EXPLORATORY performed by Guinevere Ferrari, MD at [facility]       Allergies   Allergen Reactions    Augmentin [Amoxicillin-Pot Clavulanate] Hives     Tolerated ceftriaxone 06/2022    Codeine      Unknown reaction       Social Connections: Not on file       No family status information on file.           Review of Systems - Negative except those mentioned in H&P      PHYSICAL EXAM:  General:          Awake, in no distress  EENT:              EOMI. Anicteric sclerae. MMM  Resp:               CTA bilaterally, no wheezing or rales.  No accessory muscle use  CV:                  Regular  rhythm,  No edema  GI:                   Soft, Non distended, Non tender.  +Bowel sounds  Neurologic:      Alert and oriented X 3, normal speech,   Psych:             Good insight. Not anxious nor agitated  Skin:                No rashes.  No jaundice.  Extremities :  No edema, discoloration of  toes++.   Drain+ for green thick fluid++  Waymon Amato, MD Jerrel Ivory

## 2022-07-07 NOTE — Plan of Care (Signed)
Problem: Safety - Adult  Goal: Free from fall injury  07/07/2022 0410 by Jearld Lesch, RN  Outcome: Progressing  07/06/2022 1608 by Ernest Mallick, RN  Outcome: Progressing  07/06/2022 1607 by Ernest Mallick, RN  Outcome: Progressing     Problem: Respiratory - Adult  Goal: Achieves optimal ventilation and oxygenation  07/07/2022 0410 by Jearld Lesch, RN  Outcome: Progressing  07/06/2022 1608 by Ernest Mallick, RN  Outcome: Progressing  07/06/2022 1607 by Ernest Mallick, RN  Outcome: Progressing     Problem: Pain  Goal: Verbalizes/displays adequate comfort level or baseline comfort level  07/07/2022 0410 by Jearld Lesch, RN  Outcome: Progressing  07/06/2022 1608 by Ernest Mallick, RN  Outcome: Progressing  07/06/2022 1607 by Ernest Mallick, RN  Outcome: Progressing     Problem: Discharge Planning  Goal: Discharge to home or other facility with appropriate resources  07/07/2022 0410 by Jearld Lesch, RN  Outcome: Progressing  07/06/2022 1608 by Ernest Mallick, RN  Outcome: Progressing  07/06/2022 1607 by Ernest Mallick, RN  Outcome: Progressing     Problem: Chronic Conditions and Co-morbidities  Goal: Patient's chronic conditions and co-morbidity symptoms are monitored and maintained or improved  07/07/2022 0410 by Jearld Lesch, RN  Outcome: Progressing  07/06/2022 1608 by Ernest Mallick, RN  Outcome: Progressing  07/06/2022 1607 by Ernest Mallick, RN  Outcome: Progressing     Problem: Neurosensory - Adult  Goal: Achieves maximal functionality and self care  07/07/2022 0410 by Jearld Lesch, RN  Outcome: Progressing  07/06/2022 1608 by Ernest Mallick, RN  Outcome: Progressing  07/06/2022 1607 by Ernest Mallick, RN  Outcome: Progressing     Problem: Cardiovascular - Adult  Goal: Maintains optimal cardiac output and hemodynamic stability  07/07/2022 0410 by Jearld Lesch, RN  Outcome: Progressing  07/06/2022 1608 by Ernest Mallick, RN  Outcome: Progressing  07/06/2022 1607 by Ernest Mallick, RN  Outcome: Progressing  Goal: Absence of cardiac dysrhythmias or at  baseline  07/07/2022 0410 by Jearld Lesch, RN  Outcome: Progressing  07/06/2022 1608 by Ernest Mallick, RN  Outcome: Progressing  07/06/2022 1607 by Ernest Mallick, RN  Outcome: Progressing     Problem: Gastrointestinal - Adult  Goal: Maintains or returns to baseline bowel function  07/07/2022 0410 by Jearld Lesch, RN  Outcome: Progressing  07/06/2022 1608 by Ernest Mallick, RN  Outcome: Progressing  07/06/2022 1607 by Ernest Mallick, RN  Outcome: Progressing  Goal: Maintains adequate nutritional intake  07/07/2022 0410 by Jearld Lesch, RN  Outcome: Progressing  07/06/2022 1608 by Ernest Mallick, RN  Outcome: Progressing  07/06/2022 1607 by Ernest Mallick, RN  Outcome: Progressing     Problem: Genitourinary - Adult  Goal: Absence of urinary retention  07/07/2022 0410 by Jearld Lesch, RN  Outcome: Progressing  07/06/2022 1608 by Ernest Mallick, RN  Outcome: Progressing  07/06/2022 1607 by Ernest Mallick, RN  Outcome: Progressing     Problem: Metabolic/Fluid and Electrolytes - Adult  Goal: Electrolytes maintained within normal limits  07/07/2022 0410 by Jearld Lesch, RN  Outcome: Progressing  07/06/2022 1608 by Ernest Mallick, RN  Outcome: Progressing  07/06/2022 1607 by Ernest Mallick, RN  Outcome: Progressing  Goal: Hemodynamic stability and optimal renal function maintained  07/07/2022 0410 by Jearld Lesch, RN  Outcome: Progressing  07/06/2022 1608 by Ernest Mallick, RN  Outcome: Progressing  07/06/2022 1607 by Ernest Mallick,  RN  Outcome: Progressing  Goal: Glucose maintained within prescribed range  07/07/2022 0410 by Jearld Lesch, RN  Outcome: Progressing  07/06/2022 1608 by Ernest Mallick, RN  Outcome: Progressing  07/06/2022 1607 by Ernest Mallick, RN  Outcome: Progressing     Problem: Skin/Tissue Integrity - Adult  Goal: Skin integrity remains intact  07/07/2022 0410 by Jearld Lesch, RN  Outcome: Progressing  Flowsheets (Taken 07/06/2022 2003)  Skin Integrity Remains Intact: Monitor for areas of redness and/or skin breakdown  07/06/2022 1608 by Ernest Mallick,  RN  Outcome: Progressing  07/06/2022 1607 by Ernest Mallick, RN  Outcome: Progressing  Goal: Incisions, wounds, or drain sites healing without S/S of infection  07/07/2022 0410 by Jearld Lesch, RN  Outcome: Progressing  07/06/2022 1608 by Ernest Mallick, RN  Outcome: Progressing  07/06/2022 1607 by Ernest Mallick, RN  Outcome: Progressing  Goal: Oral mucous membranes remain intact  07/07/2022 0410 by Jearld Lesch, RN  Outcome: Progressing  07/06/2022 1608 by Ernest Mallick, RN  Outcome: Progressing  07/06/2022 1607 by Ernest Mallick, RN  Outcome: Progressing     Problem: Hematologic - Adult  Goal: Maintains hematologic stability  07/07/2022 0410 by Jearld Lesch, RN  Outcome: Progressing  07/06/2022 1608 by Ernest Mallick, RN  Outcome: Progressing  07/06/2022 1607 by Ernest Mallick, RN  Outcome: Progressing     Problem: Musculoskeletal - Adult  Goal: Return mobility to safest level of function  07/07/2022 0410 by Jearld Lesch, RN  Outcome: Progressing  07/06/2022 1608 by Ernest Mallick, RN  Outcome: Progressing  07/06/2022 1607 by Ernest Mallick, RN  Outcome: Progressing  Goal: Maintain proper alignment of affected body part  07/07/2022 0410 by Jearld Lesch, RN  Outcome: Progressing  07/06/2022 1608 by Ernest Mallick, RN  Outcome: Progressing  07/06/2022 1607 by Ernest Mallick, RN  Outcome: Progressing  Goal: Return ADL status to a safe level of function  07/07/2022 0410 by Jearld Lesch, RN  Outcome: Progressing  07/06/2022 1608 by Ernest Mallick, RN  Outcome: Progressing  07/06/2022 1607 by Ernest Mallick, RN  Outcome: Progressing     Problem: Skin/Tissue Integrity  Goal: Absence of new skin breakdown  Description: 1.  Monitor for areas of redness and/or skin breakdown  2.  Assess vascular access sites hourly  3.  Every 4-6 hours minimum:  Change oxygen saturation probe site  4.  Every 4-6 hours:  If on nasal continuous positive airway pressure, respiratory therapy assess nares and determine need for appliance change or resting period.  07/07/2022 0410 by  Jearld Lesch, RN  Outcome: Progressing  07/06/2022 1608 by Ernest Mallick, RN  Outcome: Progressing  07/06/2022 1607 by Ernest Mallick, RN  Outcome: Progressing     Problem: ABCDS Injury Assessment  Goal: Absence of physical injury  07/07/2022 0410 by Jearld Lesch, RN  Outcome: Progressing  07/06/2022 1608 by Ernest Mallick, RN  Outcome: Progressing  07/06/2022 1607 by Ernest Mallick, RN  Outcome: Progressing     Problem: Safety - Medical Restraint  Goal: Remains free of injury from restraints (Restraint for Interference with Medical Device)  Description: INTERVENTIONS:  1. Determine that other, less restrictive measures have been tried or would not be effective before applying the restraint  2. Evaluate the patient's condition at the time of restraint application  3. Inform patient/family regarding the reason for restraint  4. Q2H: Monitor safety, psychosocial status, comfort, nutrition and  hydration  07/07/2022 0410 by Jearld Lesch, RN  Outcome: Progressing  07/06/2022 1608 by Ernest Mallick, RN  Outcome: Progressing  07/06/2022 1607 by Ernest Mallick, RN  Outcome: Progressing

## 2022-07-07 NOTE — Progress Notes (Signed)
Music Therapy Assessment  [facility]    Jaime [name] [id number]     09-04-1946  76 y.o.  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) 06/12/2022    Bandemia 06/12/2022    Septicemia (HCC) 06/09/2022    E coli bacteremia 06/08/2022    Hepatic abscess 06/08/2022    Peripheral arterial disease (HCC) 06/08/2022    Hyperbilirubinemia 06/08/2022    Gram negative sepsis (HCC) 06/08/2022    Intestinal obstruction (HCC) 06/05/2022    Severe sepsis (HCC) 06/05/2022    Septic shock (HCC) 06/05/2022    Escherichia coli septicemia (HCC) 06/05/2022    Liver abscess 06/05/2022    Pneumoperitoneum 06/05/2022    Acute respiratory failure with hypoxia (HCC) 06/05/2022    AKI (acute kidney injury) (HCC) 06/05/2022    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    Thrombocytopenia (HCC) 06/05/2022    Poorly controlled type 2 diabetes mellitus (HCC) 06/02/2022    Gastric perforation (HCC) 06/01/2022        Date: 07/07/2022            Total Time Calculated: 5 min          MRM 2 CARDIAC MEDICAL STEP DOWN    Mental Status:   [x]  Alert [  ] Forgetful [  ]  Confused  [  ] Minimally responsive  [  ] Sleeping    Communication Status: [  ] Impaired Speech [  ] Nonverbal -N/A    Physical Status:   [  ] Oxygen in use  [  ] Hard of Hearing [  ] Vision Impaired  [  ] Ambulatory  [  ] Ambulatory with assistance [  ] Non-ambulatory -N/A    Session Observations:  Referred by , LCSW; Patient (pt) was alert, sitting up in bed and talking on the phone when this music therapist (MT) entered the space. When asked if he would like to continue his phone call, pt confirmed this. MT exited.    Levert Feinstein, MT-BC (Music Therapist, Board  Certified)  Spiritual Care Department  Referral-Based Services

## 2022-07-07 NOTE — Progress Notes (Signed)
Music Therapy Assessment  [place] [id number]     09/29/1946  76 y.o.  male    Patient Telephone Number: [phone] (home)   Religious Affiliation: Unknown   Language: English   Patient Active Problem List    Diagnosis Date Noted    Wound dehiscence 06/22/2022    Acute pancreatitis 06/18/2022    Aspiration pneumonia of both lower lobes ([place]) 06/12/2022    Gangrene of toe of both feet ([place]) 06/12/2022    Bandemia 06/12/2022    Septicemia ([place]) 06/09/2022    E coli bacteremia 06/08/2022    Hepatic abscess 06/08/2022    Peripheral arterial disease ([place]) 06/08/2022    Hyperbilirubinemia 06/08/2022    Gram negative sepsis ([place]) 06/08/2022    Intestinal obstruction ([place]) 06/05/2022    Severe sepsis ([place]) 06/05/2022    Septic shock ([place]) 06/05/2022    Escherichia coli septicemia ([place]) 06/05/2022    Liver abscess 06/05/2022    Pneumoperitoneum 06/05/2022    Acute respiratory failure with hypoxia ([place]) 06/05/2022    AKI (acute kidney injury) ([place]) 06/05/2022    Multi-organ failure with heart failure ([place]) 06/05/2022    Thrombocytopenia ([place]) 06/05/2022    Poorly controlled type 2 diabetes mellitus ([place]) 06/02/2022    Gastric perforation ([place]) 06/01/2022        Date: 07/07/2022            Total Time Calculated: 35 min          MRM 2 CARDIAC MEDICAL STEP DOWN    Mental Status:   [x]  Alert [  ] Forgetful [  ]  Confused  [  ] Minimally responsive  [  ] Sleeping    Communication Status: [  ] Impaired Speech [  ] Nonverbal -N/A    Physical Status:   [  ] Oxygen in use  [  ] Hard of Hearing [  ] Vision Impaired  [  ] Ambulatory  [  ] Ambulatory with assistance [  ] Non-ambulatory -N/A    Music Preferences, Background: Pt enjoy artists like ACDC, The Rolling Stones, Merry Proud, Fritzi Mandes, and classic country.     Clinical Problem addressed: Emotional support. Positive social interaction    Goal(s) met in session:  Physical/Pain management (Scale of 1-10):    Pre-session rating: Pt denied  pain  Post-session rating: Pt denied pain  [  ] Increased relaxation   [  ] Affected breathing patterns  [  ] Decreased muscle tension   [  ] Decreased agitation  [  ] Affected heart rate    [x]  Increased alertness     Emotional/Psychological:  [x]  Increased self-expression   [  ] Decreased aggressive behavior   [  ] Decreased feelings of stress  [  ] Discussed healthy coping skills     [  ] Improved mood    [  ] Decreased withdrawn behavior     Social:  [  ] Decreased feelings of isolation/loneliness [x]  Positive social interaction   [  ] Provided support and/or comfort for family/friends    Spiritual:  [  ] Spiritual support    [  ] Expressed peace  [x]  Expressed faith    [x]  Discussed beliefs    Techniques Utilized (Check all that apply):   [  ] Procedural support MT [x]  Music for relaxation [x]  Patient preferred music  [x]  Lyric analysis  [  ] Song choice  [x]  Music for  validation  [  ] Entrainment  [  ] Movement to music [  ] Guided visualization  [  ] ISO Principle  [  ] Patient instrument playing [  ] Song writing  [  ] Sing along   [  ] Improvisation  [  ] Sensory stimulation  [x]  Active Listening  [  ] Music for spiritual support [  ] Making of CDs as gifts    Session Observations:  F/up visit; Patient (pt) was lying in bed, resting his eyes when this music therapist (MT) entered the space. MT greeted him upon arrival and pt opened his eyes in response to this. Pt appeared in good spirits as he shared about the phone call with his spouse and reported the progress he has made regarding his health. He shared about the rest and exercise he has been intentional on getting during his admission, and expressed hope in being able to d/c to rehab soon, so he's able to return home to his spouse. MT provided active listening and words of validation/encouragement in response to his sharing, and introduced self/music therapy services. Pt shared about his music preferences, as well as began speaking about his outlook on  life. Pt expressed concern in the world, and wished for more love between human beings throughout the world. MT provided further active listening during this time, and then offered to play Dionne Warwick's What The World Needs Now to validate his sharing and outlook. Pt expressed an openness to this. As MT sang and played this with guitar, pt increased relaxation and emotional expression as evidenced by (AEB) closing his eyes and becoming tearful. MT checked in with him, to make sure the selection was not causing harm. Pt confirmed he was ok and requested the MT continue the song. MT did so, and then allowed space for the pt to express his thoughts on the song selection, and what stood out most to him. As MT concluded the session, pt thanked this MT for the song and visit. MT expressed gratitude for his openness and asked if he was needing anything.  Pt declined this and mentioned wanting to call his brother at this time. MT exited.    Jearld Adjutant, MT-BC (Music Therapist, Board Certified)  [place]

## 2022-07-07 NOTE — Progress Notes (Addendum)
Bedside and Verbal shift change report given to Nadara Mustard, RN  (oncoming nurse) by [employer] (offgoing nurse). Report included the following information Nurse Handoff Report, Index, ED Encounter Summary, Intake/Output, MAR, Recent Results, and Cardiac Rhythm NSR .      1215: Wound care RN at bedside changing wound vac dressing. Sacral dressing change completed.     1600: PT at bedside.     1650: Pt off floor for CT abdomen.    End of Shift Note    Bedside shift change report given to RN (oncoming nurse) by Nadara Mustard, RN (offgoing nurse).  Report included the following information SBAR, Kardex, ED Summary, Intake/Output, MAR, Recent Results, and Cardiac Rhythm NSR    Shift worked:  [phone]     Shift summary and any significant changes:     No significant changes.          Concerns for physician to address:       Zone phone for oncoming shift:          Activity:     Number times ambulated in hallways past shift: 0  Number of times OOB to chair past shift: 0    Cardiac:   Cardiac Monitoring: Yes           Access:  Current line(s): PIV and HD access     Genitourinary:   Urinary status: oliguric    Respiratory:      Chronic home O2 use?: NO  Incentive spirometer at bedside: NO       GI:     Current diet:  ADULT ORAL NUTRITION SUPPLEMENT; Breakfast, Lunch, Dinner; Renal Oral Supplement  DIET ONE TIME MESSAGE;  DIET ONE TIME MESSAGE;  ADULT DIET; Easy to Chew; 5 carb choices (75 gm/meal); Low Phosphorus (Less than 1000 mg)  Passing flatus: YES  Tolerating current diet: YES       Pain Management:   Patient states pain is manageable on current regimen: YES    Skin:     Interventions: specialty bed, float heels, increase time out of bed, and PT/OT consult    Patient Safety:  Fall Score:    Interventions: bed/chair alarm, assistive device (walker, cane. etc), gripper socks, pt to call before getting OOB, and stay with me (per policy)       Length of Stay:  Expected LOS: 40  Actual LOS: 36      Nadara Mustard,  RN

## 2022-07-07 NOTE — Care Coordination-Inpatient (Addendum)
Transition of Care Plan:     RUR: 19% (moderate RUR)  Prior Level of Functioning:  Independent - lives with wife in [place].  Disposition: [facility], [place] - completed [employer] Forms for financial eligibility faxed to the [place] in [place] and [place] vs. Placement in [place] area for SNF/LTAC  If SNF or IPR: Date FOC offered: 06/30/2022 SNF  Date FOC received: Agreeable to [facility]  Accepting facility: TBD  Date authorization started with reference number: TBD  Date authorization received and expires: TBD  Follow up appointments: PCP/Specialist  DME needed: TBD  Transportation at discharge: [facility] anticipated  IM/IMM Medicare/Tricare letter given: 2nd IM needed prior to discharge.  Is patient a Veteran and connected with VA? yes              If yes, was [employer] transfer form completed and VA notified? No.  Caregiver Contact: Jaime [name] - Wife - [phone]  Discharge Caregiver contacted prior to discharge? Patient to contact.  Care Conference needed? No.  Barriers to discharge: new HD, medical stability, wound vac, placement    [phone] - [employer] case worker anticipated to return call to CM this afternoon to help determine disposition plans.  CM Team Lead also following case.    1638 - CM had received multiple VMs from patient's DTR, Jaime [name]; CM contacted patient's spouse, Jaime [name].  She was not sure regarding what Erskine Squibb was needing.  Jaime [name] is agreeable to remaining as the main point of contact for the patient but is aware that she may contact CM if anything else is needed.      Ronnell Freshwater, BSN, RN    Care Management  [phone]

## 2022-07-07 NOTE — Care Coordination-Inpatient (Addendum)
CM follow-up note:    See previous CM note for today.  CM placed a call to [facility] Support at [phone]- case/referral has been sent to get case assigned - unable to leave CM contact info - was asked to call back tomorrow again to see if case has been assigned to a worker.  Will follow-up tomorrow.  Ella Jubilee, MSW

## 2022-07-07 NOTE — Progress Notes (Signed)
Braden Score 12. All of the following interventions have been implemented to prevent pressure injury:    SKIN ASSESSMENT (S)  Dual skin assessment completed at shift change: Yes  Name of second RN who completed Dual Skin Assessment: Shanda Bumps, Rn  Picture of wound uploaded to EMR: Yes  Venelex ordered and given per protocol: Yes  Wound care consulted if wounds present: Yes    SURFACE (S)  Stryker air pump or specialty bed ordered: Yes  Type of bed: Air bed  Only white chux used with specialty surfaces (no green chux used): Yes  Waffle cushion used for chair positioning: Bedrest    KEEP MOVING (K)  Mobility status (Bedrest, Chairbound, UWA x 1 assist , 2 assist, Max assist): Max assist  Q2 hour turns documented: Yes  Refusals to turn and education provided documented: NA  Device used to float heels: Heels up  PT/OT consulted: Yes    INCONTINENCE (I)  Incontinence status assessed Q2 hours: Yes  External catheter in use: No  Barrier cream in use: Zinc    NUTRITION (N)  I/O's documented every 8 hours: Yes  Oral supplements ordered if appropriate: Yes  Nutrition services consulted: Yes    All concerns about new DTI's must be escalated directly to attending MD, charge nurse, CCL and nurse director.

## 2022-07-07 NOTE — Progress Notes (Signed)
Hospitalist Progress Note    NAME:   Jaime [name]   DOB: 1946-07-12   MRN: [id number]     Date/Time: 07/07/2022 11:32 AM  Patient PCP: No primary care provider on file.    Estimated discharge date:9/2  Barriers: Stable for discharge snf placement , hd set up at rehab       For reference :   76 y/m with prolonged hospitalization, was admitted to surgical service on 7/31 for perforated hollow viscus underwent emergent laparotomy/ septic shock d/t peritonitis, was intubated and extubated x 2. Also had AKI needing CVVH, now on HD. Also has bilateral foot gangrene. Was transferred to hospitalist service on 8/21.      Assessment / Plan:  #Septic shock- resolved   D/t perforated viscus  S/p Laparotomy 7/31, with wound vac drainage   Liver abscess s/p IR drainage  Bacteremia   Final ID recommendation to continue:  -Ceftriaxone 2G   IV daily x 6 weeks end date 9/15  - Flagyl 500 mg po bid end date 9/8  - Pt will require repeat imaging of liver before conclusion of therapy  -Discussed with case management regarding discharge disposition - wife needs to set up financial assistance for transfer of patient to [place] . Other option could be to go to an SNF in [place] [facility] ) .  Continue labs with dialysis patient doing well continue same plan of management waiting for discharge plan          #Acute hypoxemic respiratory failure:  Intubated 7/31, extubation 8/7  -Reintubated 8/13, extubated on 8/19  -Currently on 2 liters NC  -Weaned off o2      #Dysphagia:  -Ongoing SLP evaluation-patient tolerating small diet .   -Aspiration on FEES study  -Follow-up SLP evaluation     #Acute kidney injury:  -Now on HD  -Continue with HD  -Nephrology following-planned for permcath in am tomorrow .   -Case management informed the need to set up HD at SNF prior to discharge .  -Will follow up cmp .     #Bilateral lower extremity critical limb ischemia  Gangrene both feet  -Podiatry and vascular surgery following  Cleared by podiatry for  demarcation+ autoamputation    #Diabetes mellitus:  -Sliding scale and lantus  -Monitor FS  -Diabetes team following     #Sacral wound:  -Wound care following       Medical Decision Making:   I personally reviewed labs:na   I personally reviewed imaging: na   I personally reviewed EKG: None  Toxic drug monitoring: Monitor creatinine with abx  Discussed case with: RN, patient     Code Status: Full  DVT Prophylaxis: heparin  GI Prophylaxis:none   Subjective:     Chief Complaint / Reason for Physician Visit  Labs and charts reviewed and patient examined .   No new issues, continue moving his arm and feeds the swelling in the upper extremity improving a lot patient doing good overall         Objective:     VITALS:   Last 24hrs VS reviewed since prior progress note. Most recent are:  Patient Vitals for the past 24 hrs:   BP Temp Temp src Pulse Resp SpO2   07/07/22 0730 (!) 150/86 97.5 F (36.4 C) Oral 88 19 (!) 9 %   07/07/22 0332 (!) 149/75 97.6 F (36.4 C) -- 85 22 99 %   07/06/22 2345 113/65 97.8 F (36.6 C) -- 84 17 99 %  07/06/22 1925 (!) 143/70 97.1 F (36.2 C) Axillary 97 18 95 %   07/06/22 1510 (!) 142/72 97.5 F (36.4 C) Axillary 98 21 96 %   07/06/22 1154 (!) 142/80 97.5 F (36.4 C) Axillary 88 18 --           Intake/Output Summary (Last 24 hours) at 07/07/2022 1132  Last data filed at 07/07/2022 0730  Gross per 24 hour   Intake 220 ml   Output 0 ml   Net 220 ml          I had a face to face encounter and independently examined this patient on 07/07/2022, as outlined below:  PHYSICAL EXAM:  General:          Alert, cooperative  EENT:              EOMI. Anicteric sclerae.Rt. hd catn - jugular   Resp:               CTA bilaterally, no wheezing or rales.  No accessory muscle use  CV:                  Regular  rhythm,  No edema, AS murmur   GI:                   Soft, Non distended,Mildly tender.  +Bowel sounds, wound vac in place , drain on rt upper quadrant - purulent material   Neurologic:       Alert and  oriented X 3, normal speech,   Psych:   Good insight. Not anxious nor agitated  Skin:                No rashes.  No jaundice  Foot :  bilateral feet - gangrene of first second and third toes , not yet full demarcation           Reviewed most current lab test results and cultures  YES  Reviewed most current radiology test results   YES  Review and summation of old records today    NO  Reviewed patient's current orders and MAR    YES  PMH/SH reviewed - no change compared to H&P  ________________________________________________________________________  Care Plan discussed with:    Comments   Patient x    Family      RN x    Care Manager     Consultant                        Multidiciplinary team rounds were held today with case manager, nursing, pharmacist and Higher education careers adviser.  Patient's plan of care was discussed; medications were reviewed and discharge planning was addressed.     ________________________________________________________________________  Total NON critical care TIME:  50  Minutes    Total CRITICAL CARE TIME Spent:   Minutes non procedure based      Comments   >50% of visit spent in counseling and coordination of care     ________________________________________________________________________  Consepcion Hearing, MD     Procedures: see electronic medical records for all procedures/Xrays and details which were not copied into this note but were reviewed prior to creation of Plan.      LABS:  I reviewed today's most current labs and imaging studies.  Pertinent labs include:  Recent Labs     07/06/22  0417   WBC 10.7   HGB 8.0*   HCT 25.8*   PLT 212  Recent Labs     07/06/22  0417   NA 138   K 5.3*   CL 104   CO2 26   GLUCOSE 121*   BUN 61*   CREATININE 5.23*   CALCIUM 8.7   MG 2.7*   PHOS 6.8*   LABALBU 1.9*   BILITOT 1.0   AST 36   ALT 19         Signed: Consepcion Hearing, MD

## 2022-07-07 NOTE — Plan of Care (Signed)
Problem: Physical Therapy - Adult  Goal: By Discharge: Performs mobility at highest level of function for planned discharge setting.  See evaluation for individualized goals.  Description: FUNCTIONAL STATUS PRIOR TO ADMISSION: Patient was independent and active without use of DME. Drove himself to [place] from [place] for [place] race. Denies home O2 use.     HOME SUPPORT PRIOR TO ADMISSION: The patient lived with wife however wife has her own medical issues and cannot provide physical assist.    Physical Therapy Goals  Re-Assessment 07/07/22 remain appropriate  1.  Patient will move from supine to sit and sit to supine, scoot up and down, and roll side to side in bed with mod  assist  within 7 day(s).   2. Patient will sit EOB x 15 minutes with contact guard assist within 7 days.    3.  Patient will perform sit to stand with maximal assistance x2 within 7 day(s).  4.  Patient will transfer from bed to chair and chair to bed via minimal lift equipment (best mover) within 7 day(s).      Re-Assessment 06/29/22  1.  Patient will move from supine to sit and sit to supine, scoot up and down, and roll side to side in bed with maximal assist of 1 within 7 day(s).   2. Patient will sit EOB x 15 minutes with contact guard assist within 7 days.    3.  Patient will perform sit to stand with maximal assistance x2 within 7 day(s).  4.  Patient will transfer from bed to chair and chair to bed via minimal lift equipment (best mover) within 7 day(s).    Reassessed 06/20/2022   1.  Patient will move from supine to sit and sit to supine, scoot up and down, and roll side to side in bed with maximal assist of 1 within 7 day(s).   2. Patient will sit EOB x 5 minutes with contact guard assist within 7 days.    3.  Patient will perform sit to stand with maximal assistance x2 within 7 day(s).  4.  Patient will transfer from bed to chair and chair to bed via minimal lift equipment (best mover) within 7 day(s).    Initiated 06/10/2022  1.   Patient will move from supine to sit and sit to supine, scoot up and down, and roll side to side in bed with contact guard assist within 7 day(s).   2. Patient will sit EOB x 10 minutes with supervision/set-up assist within 7 days.    2.  Patient will perform sit to stand with moderate assistance x2 within 7 day(s).  3.  Patient will transfer from bed to chair and chair to bed with moderate assistance x2 using the least restrictive device within 7 day(s).  4.  Patient will ambulate with moderate assistance x2 for 5 feet with the least restrictive device within 7 day(s).   Outcome: Progressing   PHYSICAL THERAPY TREATMENT: WEEKLY REASSESSMENT    Patient: Jaime [name] 76 y.o. male)  Date: 07/07/2022  Primary Diagnosis: Septicemia (HCC) [A41.9]  Gastric perforation (HCC) [K25.5]  Perforated abdominal viscus [R19.8]  Procedure(s) (LRB):  LAPAROTOMY EXPLORATORY (N/A)  LAPAROSCOPY DIAGNOSTIC (N/A)  ENDOSCOPY OF ILEAL CONDUIT (N/A) 36 Days Post-Op   Precautions: Modified Diet, General Precautions, Contact Precautions, Fall Risk                    ASSESSMENT :  Patient continues to benefit from skilled PT services and is  progressing towards goals. Patient received in bed and agreeable to participate, requires mod to max assist to roll left and come to sit EOB.  Improved tolerance of sitting today and patient able to work on weight shifting, core stability, UE reaching and head turns with good tolerance.  Continues to need support with challenges and tends to lose balance to left.  Also performed bed exercises for strengthening x 10 reps each with improved ROM noted. Rolled right and left numerous times for clean up of BM, now able to firmly grasp rail to assist.  Patient is an excellent candidate for rehab, he is motivated to participate and remains well below his indep baseline.     Patient's progression toward goals since last assessment: patient is progressing, goals remain appropriate    Functional Outcome Measure:   The patient scored 8/24 on the AMPAC outcome measure which is indicative of Cutoff score ?171,2,3 had higher odds of discharging home with home health or need of SNF/IPR.Marland Kitchen        PLAN :  Goals have been updated based on progression since last assessment.  Patient continues to benefit from skilled intervention to address the above impairments.    Recommendations and Planned Interventions:   bed mobility training, transfer training, gait training, therapeutic exercises, neuromuscular re-education, patient and family training/education, and therapeutic activities    Frequency/Duration: Patient will be followed by physical therapy to address goals,   per day/week to address goals.    Recommendation for discharge: (in order for the patient to meet his/her long term goals): Therapy 3 hours/day 5-7 days/week    Other factors to consider for discharge: patient's current support system is unable to meet their requirements for physical assistance, high risk for falls, and not safe to be alone    IF patient discharges home will need the following DME: continuing to assess with progress     SUBJECTIVE:   Patient stated "I missed seeing you over the weekend."    OBJECTIVE DATA SUMMARY:     History reviewed. No pertinent past medical history.  Past Surgical History:   Procedure Laterality Date    BLADDER SURGERY N/A 06/01/2022    ENDOSCOPY OF ILEAL CONDUIT performed by Guinevere Ferrari, MD at [facility]    CT VISCERAL PERCUTANEOUS DRAIN  06/05/2022    CT VISCERAL PERCUTANEOUS DRAIN 06/05/2022 [facility]    IR TUNNELED CATHETER PLACEMENT GREATER THAN 5 YEARS  07/01/2022    IR TUNNELED CATHETER PLACEMENT GREATER THAN 5 YEARS 07/01/2022 [facility], APRN - NP [facility] ANGIO IR    LAPAROSCOPY N/A 06/01/2022    LAPAROSCOPY DIAGNOSTIC performed by Guinevere Ferrari, MD at [facility]    LAPAROTOMY N/A 06/01/2022    LAPAROTOMY EXPLORATORY performed by Guinevere Ferrari, MD at [facility]       Home Situation:  Social/Functional History  Lives With:  Spouse  Type of Home: House  Home Layout: One level  Home Equipment: None  ADL Assistance: Independent  Ambulation Assistance: Independent  Transfer Assistance: Independent  Active Driver: Yes  Critical Behavior:  Orientation  Overall Orientation Status: Within Functional Limits  Orientation Level: Oriented X4  Cognition  Overall Cognitive Status: WFL  Arousal/Alertness: Appropriate responses to stimuli  Following Commands: Follows one step commands with increased time    Hearing:   Hearing  Hearing: Within functional limits    Vision/Perceptual:  Strength:         Tone & Sensation:           Range Of Motion:          Functional Mobility:  Bed Mobility:     Bed Mobility Training  Bed Mobility Training: Yes  Overall Level of Assistance: Maximum assistance;Moderate assistance;Assist X2;Additional time;Adaptive equipment (effective grip on bedrail B UE)  Rolling: Maximum assistance;Moderate assistance;Assist X2;Additional time;Adaptive equipment (nice improvement)  Supine to Sit: Maximum assistance;Assist X2;Additional time  Sit to Supine: Maximum assistance;Assist X2  Scooting: Maximum assistance;Assist X2  Transfers:        Balance:               Balance  Sitting: Impaired  Sitting - Static: Unsupported;Fair (occasional)  Sitting - Dynamic: Poor (constant support);Fair (occasional) (good improvement)  Standing:  (progressed to point where next tx session will be ready for [employer] attempt at standing)  Ambulation/Gait Training:                                                                                                                                                                                                                                                                                [employer] AM-PAC      Basic Mobility Inpatient Short Form (6-Clicks) Version 2  How much HELP from another person do you currently need... (If the patient hasn't done an activity recently, how much  help from another person do you think they would need if they tried?) Total A Lot A Little None   1.  Turning from your back to your side while in a flat bed without using bedrails? []   1 [x]   2 []   3  []   4   2.  Moving from lying on your back to sitting on the side of a flat bed without using bedrails? []   1 [x]   2 []   3  []   4   3.  Moving to and from a bed to a chair (including a wheelchair)? [x]   1 []   2 []   3  []   4   4. Standing up from a chair using your arms (e.g.  wheelchair or bedside chair)? [x]   1 []   2 []   3  []   4   5.  Walking in hospital room? [x]   1 []   2 []   3  []   4   6.  Climbing 3-5 steps with a railing? [x]   1 []   2 []   3  []   4     Raw Score: 8/24                            Cutoff score ?171,2,3 had higher odds of discharging home with home health or need of SNF/IPR.    1. , , Vinoth , Passek, . .  Validity of the AM-PAC "6-Clicks" Inpatient Daily Activity and Basic Mobility Short Forms. Physical Therapy Mar 2014, [phone]) [phone]; DOI: 10.2522/ptj.20130199  2. . Association of AM-PAC "6-Clicks" Basic Mobility and Daily Activity Scores With Discharge Destination. Phys Ther. 2021 Apr 4;101(4):pzab043. doi: 10.1093/ptj/pzab043. PMID: .  3. Herbold J, Rajaraman D, , Agayby K, [place] S. Activity Measure for Post-Acute Care "6-Clicks" Basic Mobility Scores Predict Discharge Destination After Acute Care Hospitalization in Select Patient Groups: A Retrospective, Observational Study. Arch Rehabil Res Clin Transl. 2022 Jul 16;4(3):100204. doi: 10.1016/j.arrct.Janeece Riggers. PMID: Fransico Meadow; PMCIDLupe Carney.  4. Thornton Dales, Coster W, Ni P. AM-PAC Short Forms Manual 4.0. Revised 12/2018.                                                                                                                                                                                                                               Activity Tolerance:   Good    After treatment:   Patient left in no apparent distress in bed, Call bell within reach, Bed/ chair alarm activated, and Side rails x3    COMMUNICATION/EDUCATION:   The patient's plan of care was discussed with: occupational therapist and registered nurse         Thank you for this referral.  [birth year]-20-1983, PT  Minutes: [phone]

## 2022-07-07 NOTE — Plan of Care (Signed)
Speech LAnguage Pathology TREATMENT    Patient: Jaime [name] (76 y.o. male)  Date: 07/07/2022  Primary Diagnosis: Septicemia ([place]) [A41.9]  Gastric perforation ([place]) [K25.5]  Perforated abdominal viscus [R19.8]  Procedure(s) (LRB):  LAPAROTOMY EXPLORATORY (N/A)  LAPAROSCOPY DIAGNOSTIC (N/A)  ENDOSCOPY OF ILEAL CONDUIT (N/A) 36 Days Post-Op   Precautions:   Modified Diet, General Precautions, Contact Precautions, Fall Risk                  ASSESSMENT :  Based on the objective data described below, the patient presents with continued tolerance of minced and moist diet with thin liquids. Patient fed by staff. Note prolonged oral manipulation. Cough x 1 during meal. This is consistent with FEES results with coughing noted in absence of aspiration. It is reasonable to advance diet at this time.   Of note, packet of moderately thick thickener found at bedside. This is not needed for this patient. Notified RN.     Patient will benefit from skilled intervention to address the above impairments.     PLAN :  Recommendations and Planned Interventions:  Diet: Easy to chew and thin liquids  Do not thicken liquids  Upright for all PO     Acute SLP Services: Yes, patient will be followed by speech-language pathology 3x/week to address goals. Patient's rehabilitation potential is considered to be Good.  Re-Evaluation: Yes, progress toward goals: good. Patient will be followed by SLP 3x/week   Discharge Recommendations: Continue to assess pending progress     SUBJECTIVE:   Patient stated, "My weekend had ups and downs but it was fine."    OBJECTIVE:   History reviewed. No pertinent past medical history.  Past Surgical History:   Procedure Laterality Date    BLADDER SURGERY N/A 06/01/2022    ENDOSCOPY OF ILEAL CONDUIT performed by Melene Plan, MD at [facility]    CT VISCERAL PERCUTANEOUS DRAIN  06/05/2022    CT VISCERAL PERCUTANEOUS DRAIN 06/05/2022 [facility] CT    IR TUNNELED CATHETER PLACEMENT GREATER THAN 5 YEARS  07/01/2022    IR  TUNNELED CATHETER PLACEMENT GREATER THAN 5 YEARS 07/01/2022 [facility], APRN - NP [facility] ANGIO IR    LAPAROSCOPY N/A 06/01/2022    LAPAROSCOPY DIAGNOSTIC performed by Melene Plan, MD at [place] N/A 06/01/2022    LAPAROTOMY EXPLORATORY performed by Melene Plan, MD at [facility]     Prior Level of Function/Home Situation:   Social/Functional History  Lives With: Spouse  Type of Home: House  Home Layout: One level  Home Equipment: None  ADL Assistance: Independent  Ambulation Assistance: Independent  Transfer Assistance: Independent  Active Driver: Yes    Baseline Assessment:                Cognitive and Communication Status:  Neurologic State: Alert    Cognition: Follows commands    Dysphagia:  Oral Assessment:     P.O. Trials:  PO Trials  Assessment Method(s): Observation  Patient Position: up in bed  Vocal Quality: No Impairment  Consistency Presented: Thin;Soft & Bite Sized  How Presented: Straw (fed by staff)  Bolus Acceptance: No impairment  Bolus Formation/Control: Impaired  Type of Impairment: Delayed  Propulsion: Delayed (# of seconds)  Oral Residue: None  Aspiration Signs/Symptoms:  (cough once)              Respiratory Status/Airway:  Room air  After treatment:   Patient left in no apparent distress in bed, Call bell left within reach, and Nursing notified    COMMUNICATION/EDUCATION:   Patient was educated regarding purpose of SLP visit and plan. He participated.     The patient's plan of care including recommendations, planned interventions, and recommended diet changes were discussed with: Registered nurse    Patient/family agree to work toward stated goals and plan of care    Thank you,  Etta Quill, SLP  Minutes: 15   Problem: SLP Adult - Impaired Swallowing  Goal: By Discharge: Advance to least restrictive diet without signs or symptoms of aspiration for planned discharge setting.  See evaluation for individualized goals.  Description:  Speech Pathology Goals  1. Patient will participate in FEES to objectively assess swallow function prior to PO diet initiation. Goal initiated 06/22/22. Goal met 06/22/22.   2. Patient will tolerate therapeutic PO trials of thin liquids (e.g., ice chips, small sips of water) and purees without clinical s/s of aspiration or respiratory decline. Goal initiated 06/22/22. MET 06/30/2022  3. Patient will complete pharyngeal strengthening exercises (e.g., effortful swallow) with min cues for technique. Goal initiated 06/22/22.   4. Patient will complete repeat instrumental swallow study to determine readiness for PO diet when clinically appropriate. Goal initiated 06/22/22. MET 06/26/22.    5. Patient will tolerate least restrictive diet with no overt s/s aspiration within 7 days. Initiated 06/26/22. Continued 06/30/2022 Continue 07/07/2022    Outcome: Progressing

## 2022-07-07 NOTE — Progress Notes (Signed)
Comprehensive Nutrition Assessment    Type and Reason for Visit:  Reassess    Nutrition Recommendations/Plan:   Continue SB6/CCD/75 g CHO/meal.  Continue Nepro shakes BID.  Please document % meals and supplements consumed in flowsheet I/O's under intake.       Malnutrition Assessment:  Malnutrition Status:  Insufficient data (06/04/22 1319)      Nutrition Assessment:    Chart reviewed; med noted for septic gastric perforation, s/p laparotomy, liver abscess s/p I&D, ESRD-HD, dry gangrene both feet, dysphagia with SLP following. Pt sleeping soundly at time of visit. Observed breakfast tray and pt consumed <25% of meal. Has Nepro ordered but unsure how much he is consuming.    No data found.     Patient Vitals for the past 120 hrs:   PO Meals Eaten (%)   07/06/22 1815 26 - 50%   07/06/22 1154 26 - 50%   07/06/22 0746 0%   07/04/22 1100 1 - 25%     Wt Readings from Last 30 Encounters:   07/06/22 101.3 kg (223 lb 5.2 oz)     Nutrition Related Findings:    Labs: K+ 5.3, Creat 5.23; Meds: MVI, Renvela, Retacrit Wound Type: Surgical Incision, Wound Vac       Current Nutrition Intake & Therapies:    Average Meal Intake: 26-50%  Average Supplements Intake: 51-75%  ADULT ORAL NUTRITION SUPPLEMENT; Breakfast, Lunch, Dinner; Renal Oral Supplement  DIET ONE TIME MESSAGE;  DIET ONE TIME MESSAGE;  ADULT DIET; Easy to Chew; 5 carb choices (75 gm/meal); Low Phosphorus (Less than 1000 mg)    Anthropometric Measures:  Height: 172.7 cm (5' 8" )  Ideal Body Weight (IBW): 154 lbs (70 kg)       Current Body Weight: 234 lb 12.6 oz (106.5 kg), 154.2 % IBW. Weight Source: Not Specified  Current BMI (kg/m2): 35.7                          BMI Categories: Obese Class 2 (BMI 35.0 -39.9) (not reliable due to anasarca)    Estimated Daily Nutrient Needs:  Energy Requirements Based On: Formula  Weight Used for Energy Requirements: Current  Energy (kcal/day): MSJ 2150 (1795 x 1.2)  Weight Used for Protein Requirements: Ideal  Protein (g/day): 119-140g  (1.7-2gPro/kg IBW)  Method Used for Fluid Requirements: Other (Comment)  Fluid (ml/day): per MD    Nutrition Diagnosis:   Inadequate protein-energy intake related to impaired respiratory function as evidenced by NPO or clear liquid status due to medical condition    Nutrition Interventions:   Food and/or Nutrient Delivery: Continue Current Diet, Continue Oral Nutrition Supplement  Nutrition Education/Counseling: No recommendation at this time  Coordination of Nutrition Care: Continue to monitor while inpatient, Interdisciplinary Rounds       Goals:  Previous Goal Met: Goal(s) Achieved  Goals: PO intake 50% or greater, by next RD assessment       Nutrition Monitoring and Evaluation:   Behavioral-Environmental Outcomes: None Identified  Food/Nutrient Intake Outcomes: Food and Nutrient Intake, Supplement Intake  Physical Signs/Symptoms Outcomes: Biochemical Data, Nutrition Focused Physical Findings, Skin, Weight, GI Status, Hemodynamic Status, Fluid Status or Edema    Discharge Planning:    Too soon to determine     Berenice Primas, RD  Contact: [phone]

## 2022-07-07 NOTE — Progress Notes (Signed)
Name: Jaime [name]   MRN: [id number]  DOB: [birth year]  07/07/2022 11:46 AM      Admit Date: 05/31/2022    Admit Diagnosis: Septicemia (HCC) [A41.9]  Gastric perforation (HCC) [K25.5]  Perforated abdominal viscus [R19.8]    Assessment/Plan:     Principal Problem:    Gastric perforation (HCC)  Active Problems:    Poorly controlled type 2 diabetes mellitus (HCC)    Intestinal obstruction (HCC)    Severe sepsis (HCC)    Septic shock (HCC)    Escherichia coli septicemia (HCC)    Liver abscess    Pneumoperitoneum    Acute respiratory failure with hypoxia (HCC)    AKI (acute kidney injury) (HCC)    Multi-organ failure with heart failure (HCC)    Thrombocytopenia (HCC)    E coli bacteremia    Hepatic abscess    Peripheral arterial disease (HCC)    Hyperbilirubinemia    Gram negative sepsis (HCC)    Septicemia (HCC)    Aspiration pneumonia of both lower lobes (HCC)    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 *          07/07/22     Assessment:         AKI-grade 3; initiated kidney replacement therapy 7/31; permacath 8/30; currently  dialysis on a Monday Wednesday Friday schedule  Pancreatitis  Hyponatremia  DM  Anemia  Dry gangrene  Discussion/MDM: Patient with multiple medical comorbidities, each with high likelihood for morbidity and mortality if left untreated.  Patient being treated with medication that requires intensive monitoring due to increased risk for toxicity. I have reviewed patient's presenting subjective and objective findings, as well as all laboratory studies, imaging studies, and vital signs to date as well as treatment rendered and patient's response to those treatments.  In addition, prior medical, surgical and relevant social and family histories were reviewed.        Discussion:     Had dialysis yesterday and tolerated  treatment well  Potassium of 5.3 prior to dialysis yesterday  Hemoglobin of 8-on Procrit 6000 units on Monday Wednesday and Friday.  3.5 L of fluid removed on dialysis yesterday    Plan:     Plan for dialysis again tomorrow.  Outpatient hemodialysis on hemodialysis at rehab to be set up on discharge.  No evidence of kidney recovery yet.    Bryan Lemma, MD    Dr Dorthey Sawyer  Cell no- [phone]  Available on perfect serve.      Signed By: Bryan Lemma, MD     July 07, 2022         Subjective:   Hospital day 95   Seen and examined in the room.  Patient awake, alert and fully oriented.  Sed rate appetite is improving said that he is feeling better and looking forward for rehabilitation  York Spaniel that he tolerated dialysis well yesterday  Review of Systems:  Pertinent items are noted in the History of Present Illness.   Objective:    BP (!) 150/86   Pulse 88   Temp 97.5 F (36.4 C) (Oral)   Resp 19   Ht 1.727 m (5\' 8" )   Wt 101.3 kg (223 lb 5.2 oz)   SpO2 (!) 9%   BMI 33.96 kg/m     Physical Exam:  Physical Exam  Vitals  and nursing note reviewed.   Constitutional:       Appearance: Normal appearance.   HENT:      Head: Normocephalic and atraumatic.      Nose: Nose normal.      Mouth/Throat:      Mouth: Mucous membranes are moist.   Cardiovascular:      Rate and Rhythm: Tachycardia present.      Pulses: Normal pulses.      Heart sounds: Normal heart sounds.   Pulmonary:      Effort: Pulmonary effort is normal.   Abdominal:      General: Abdomen is flat.   Musculoskeletal:      Cervical back: Neck supple.   Skin:     Findings: Lesion present.      Comments: Dry gangrene bilateral feet   Neurological:      General: No focal deficit present.      Mental Status: He is oriented to person, place, and time.      Access-  Recent Results (from the past 24 hour(s))   POCT Glucose    Collection Time: 07/06/22 12:19 PM   Result Value Ref Range    POC Glucose 114 65 - 117 mg/dL    Performed by: Aldean Ast  RN    POCT Glucose    Collection Time: 07/06/22  6:11 PM   Result Value Ref Range    POC Glucose PLEASE DISREGARD RESULTS 65 - 117 mg/dL    Performed by: Pricilla Riffle RN    POCT Glucose    Collection Time: 07/06/22  6:14 PM   Result Value Ref Range    POC Glucose 95 65 - 117 mg/dL    Performed by: Pricilla Riffle RN    POCT Glucose    Collection Time: 07/06/22  6:15 PM   Result Value Ref Range    POC Glucose 95 65 - 117 mg/dL    Performed by: Pricilla Riffle RN    POCT Glucose    Collection Time: 07/06/22 11:45 PM   Result Value Ref Range    POC Glucose 109 65 - 117 mg/dL    Performed by: Joeseph Amor PCT    POCT Glucose    Collection Time: 07/07/22  6:09 AM   Result Value Ref Range    POC Glucose 90 65 - 117 mg/dL    Performed by: Joeseph Amor PCT           Intake/Output Summary (Last 24 hours) at 07/07/2022 1146  Last data filed at 07/07/2022 0730  Gross per 24 hour   Intake 220 ml   Output 0 ml   Net 220 ml          Data Review:   Recent Labs     07/06/22  0417   NA 138   K 5.3*   BUN 61*   CREATININE 5.23*   WBC 10.7   HGB 8.0*   HCT 25.8*   PLT 212       No current facility-administered medications on file prior to encounter.     Current Outpatient Medications on File Prior to Encounter   Medication Sig Dispense Refill    metFORMIN (GLUCOPHAGE) 500 MG tablet Take 1 tablet by mouth daily (Patient not taking: Reported on 06/08/2022)      metoprolol (LOPRESSOR) 100 MG tablet Take 1 tablet by mouth daily      hydrALAZINE (APRESOLINE) 25 MG tablet Take 1 tablet by mouth 3 times daily (Patient not taking: Reported  on 06/08/2022)      atorvastatin (LIPITOR) 40 MG tablet Take 1 tablet by mouth daily (Patient not taking: Reported on 06/08/2022)      losartan (COZAAR) 100 MG tablet Take 1 tablet by mouth daily (Patient not taking: Reported on 06/08/2022)      metFORMIN (GLUCOPHAGE-XR) 500 MG extended release tablet Take 1 tablet by mouth daily (with breakfast) (Patient not taking: Reported on 06/08/2022)      hydroCHLOROthiazide  (HYDRODIURIL) 25 MG tablet TAKE 1 TABLET BY MOUTH ONCE DAILY AS NEEDED (Patient not taking: Reported on 06/08/2022)      metoprolol succinate (TOPROL XL) 100 MG extended release tablet TAKE 1 TABLET BY MOUTH ONCE DAILY WITH OR IMMEDIATELY FOLLOWING A MEAL          Full Code  Care Plan discussed with:  Patient x    Family      RN x    Dialysis RN     Care Manager        Hospitalist    Consultant:            Comments   >50% of visit spent in counseling and coordination of care           Signed By: Bryan Lemma, MD     July 07, 2022       This dictation was done by dragon, computer voice recognition software.  Often unanticipated grammatical, syntax, phones and other interpretive errors are inadvertently transcribed.  Please excuse errors that have escaped final proofreading. Please contact me if you suspect dictation or transcription errors.      Dr Dorthey Sawyer    Office No- [phone]  [facility]  [address]  [address]  [place], [place] [address]    Fax: [phone]

## 2022-07-07 NOTE — Wound Image (Signed)
Wound Care consult for reassessment and Wound Vac dressing change to the Abdomen surgical site and to the sacrum / coccyx wounds for the daily wound care.   Chart reviewed and patient assessed. Pt. Is alert, talkative and he is pleasant.  He is needing a lot of Re-Hab to recover from this big sepsis event as well as the pressure injury and the toes that still need podiatry to address. He will most likely lose at least 6 of his toes. Pt. Has the kidney injury and this is not showing recovery yet.   The drain from the liver is still present. Unknown when this will be pulled.   Arrangements are being discussed about [name] for this patient.    Assessment today:  the abdominal wound is clean and granulating well. The periwound skin is intact. Other details in the wound flowsheet below.   The Buttocks and Sacrum / coccyx wound is progressing with the use of the Santyl collagenase ointment to debride the rest of the wound. There is pink wound tissue that is still unblanchable but the tissue bleeds when cleansed (some). Other details below in the wound flow sheet:   Negative Pressure Wound Therapy Abdomen (Active)   $ Standard NPWT <=50 sq cm PER TX $ Yes 06/30/22 1112   $ Standard NPWT >50 sq cm PER TX $ Yes 07/07/22 1232   Wound Type Surgical 07/07/22 1232   Unit Type kci ulta 07/07/22 1232   Dressing Type Black Foam 07/07/22 1232   Number of pieces used 2 07/07/22 1232   Number of pieces removed 2 07/07/22 1232   Cycle Continuous 07/06/22 0746   Target Pressure (mmHg) 125 07/07/22 1232   Irrigation Solution Vashe 06/27/22 1110   Instillation Volume  75 mL 06/27/22 1110   Soak Time  6 minutes 06/27/22 2041   Vac Frequency 4 hours 06/27/22 2041   Canister changed? No 07/07/22 1232   Dressing Status New dressing applied 07/07/22 1232   Dressing Changed Changed/New 07/07/22 1232   Drainage Amount Small 07/07/22 1232   Drainage Description Serous 07/07/22 1232   Dressing Change Due 07/10/22 07/07/22 1232    Output (ml) 0 ml 07/04/22 0330   Wound Assessment Pale granulation tissue 07/07/22 1232   Peri-wound Assessment Intact 07/07/22 1232   Odor None 07/07/22 1232   Number of days: 15       Wound 06/15/22 Sacrum Posterior DTI, stage 2 / excoriation secondary to diarrhea (Active)   Wound Image   07/07/22 1232   Wound Etiology Pressure Stage 3 07/07/22 1232   Dressing Status New dressing applied 07/07/22 1232   Wound Cleansed Vashe 07/07/22 1232   Dressing/Treatment Pharmaceutical agent (see MAR);Foam;Gauze dressing/dressing sponge 07/07/22 1232   Offloading for Diabetic Foot Ulcers Offloading ordered 07/07/22 1232   Dressing Change Due 07/08/22 07/07/22 1232   Wound Assessment Slough;Subcutaneous;Pink/red;Pale granulation tissue 07/07/22 1232   Drainage Amount Small (< 25%) 07/07/22 1232   Drainage Description Serosanguinous 07/07/22 1232   Odor None 07/07/22 1232   Peri-wound Assessment Denuded 07/07/22 1232   Margins Defined edges 07/07/22 1232   Wound Thickness Description not for Pressure Injury Full thickness 07/07/22 1232   Number of days: 21       Wound 06/19/22 Toe (Comment  which one) Right (Active)   Wound Image   07/05/22 0835   Wound Etiology Arterial 07/06/22 0746   Dressing Status Clean;Dry;Intact 07/06/22 2003   Wound Cleansed Betadine/povidone iodine 07/06/22 0746   Dressing/Treatment  Open to air 07/06/22 0746   Offloading for Diabetic Foot Ulcers Offloading ordered 07/04/22 2000   Dressing Change Due 07/05/22 07/04/22 2000   Wound Assessment Eschar dry;Purple/maroon 07/05/22 0835   Drainage Amount Scant (moist but unmeasurable) 07/05/22 0835   Drainage Description Serosanguinous 07/05/22 0835   Odor None 07/05/22 0835   Peri-wound Assessment Ecchymosis;Edematous 07/05/22 0835   Margins Defined edges 07/05/22 0835   Wound Thickness Description not for Pressure Injury Full thickness 07/05/22 0835   Number of days: 18       Wound 06/19/22 Abdomen Mid Surgical site dehisced on 06/18/22  Now Wound VAC  (Active)   Wound Image   07/07/22 1232   Wound Etiology Surgical 07/07/22 1232   Dressing Status New dressing applied 07/07/22 1232   Wound Cleansed Vashe 07/07/22 1232   Dressing/Treatment Negative pressure wound therapy 07/07/22 1232   Offloading for Diabetic Foot Ulcers Offloading ordered 07/07/22 1232   Dressing Change Due 07/10/22 07/07/22 1232   Wound Length (cm) 20.5 cm 07/07/22 1232   Wound Width (cm) 4 cm 07/07/22 1232   Wound Depth (cm) 1.8 cm 07/07/22 1232   Wound Surface Area (cm^2) 82 cm^2 07/07/22 1232   Change in Wound Size % (l*w) 2.38 07/07/22 1232   Wound Volume (cm^3) 147.6 cm^3 07/07/22 1232   Wound Healing % 50 07/07/22 1232   Wound Assessment Pink/red;Pale granulation tissue;Slough 07/07/22 1232   Drainage Amount Small (< 25%) 07/07/22 1232   Drainage Description Serous 07/07/22 1232   Odor None 07/07/22 1232   Peri-wound Assessment Intact 07/07/22 1232   Margins Defined edges 07/07/22 1232   Wound Thickness Description not for Pressure Injury Full thickness 07/07/22 1232   Number of days: 18       Wound 06/19/22 Toe (Comment  which one) Left Black toes / necrotic. (Active)   Wound Image   07/05/22 0835   Wound Etiology Arterial 07/06/22 0746   Dressing Status Clean;Dry;Intact 07/06/22 2003   Wound Cleansed Not Cleansed 07/06/22 2003   Dressing/Treatment Open to air 07/06/22 0746   Offloading for Diabetic Foot Ulcers Offloading ordered 07/06/22 0746   Dressing Change Due 07/05/22 07/05/22 0835   Wound Assessment Eschar dry 07/05/22 0835   Drainage Amount None (dry) 07/05/22 0835   Odor None 07/04/22 2000   Peri-wound Assessment Edematous;Fragile;Hyperpigmented;Ecchymosis 07/04/22 2000   Wound Thickness Description not for Pressure Injury Full thickness 06/29/22 1955   Number of days: 18      Treatment today:  wound care nurse did the Sacrum wound care today with nursing present to assist with skin separation.  No changes needed to the wound care at this time.   The Abdominal wound was cleansed  with Vashe and gauze and then dressed with the black Granufoam - one piece in the middle and a larger piece to fill in the rest of the wound. Occlusively sealed with the drape and then the pressure is at 125 mmHg. The dressing drew down immediately and patient tolerated it well without pain meds.   Plan: Continue the wound vac until discharged. The Re-hab facility can re-evaluate the wound to continue the wound vac there. Would like to continue for at least 10 more days.   If he goes to a facility this week the wound vac has to be removed and a replacement dressing applied to the wound and the Rehab place will replace a Wound Vac from that facility.   Ronnald Collum, RN, BSN, [employer]

## 2022-07-08 LAB — POCT GLUCOSE
POC Glucose: 111 mg/dL (ref 65–117)
POC Glucose: 120 mg/dL — ABNORMAL HIGH (ref 65–117)
POC Glucose: 92 mg/dL (ref 65–117)
POC Glucose: 98 mg/dL (ref 65–117)

## 2022-07-08 LAB — HEPATIC FUNCTION PANEL
ALT: 16 U/L (ref 12–78)
AST: 29 U/L (ref 15–37)
Albumin/Globulin Ratio: 0.4 — ABNORMAL LOW (ref 1.1–2.2)
Albumin: 2 g/dL — ABNORMAL LOW (ref 3.5–5.0)
Alk Phosphatase: 223 U/L — ABNORMAL HIGH (ref 45–117)
Bilirubin, Direct: 0.6 MG/DL — ABNORMAL HIGH (ref 0.0–0.2)
Globulin: 4.7 g/dL — ABNORMAL HIGH (ref 2.0–4.0)
Total Bilirubin: 1 MG/DL (ref 0.2–1.0)
Total Protein: 6.7 g/dL (ref 6.4–8.2)

## 2022-07-08 LAB — CBC
Hematocrit: 25.6 % — ABNORMAL LOW (ref 36.6–50.3)
Hemoglobin: 7.9 g/dL — ABNORMAL LOW (ref 12.1–17.0)
MCH: 32.1 PG (ref 26.0–34.0)
MCHC: 30.9 g/dL (ref 30.0–36.5)
MCV: 104.1 FL — ABNORMAL HIGH (ref 80.0–99.0)
MPV: 10.3 FL (ref 8.9–12.9)
Nucleated RBCs: 0 PER 100 WBC
Platelets: 187 10*3/uL (ref 150–400)
RBC: 2.46 M/uL — ABNORMAL LOW (ref 4.10–5.70)
RDW: 17.1 % — ABNORMAL HIGH (ref 11.5–14.5)
WBC: 7.8 10*3/uL (ref 4.1–11.1)
nRBC: 0 10*3/uL (ref 0.00–0.01)

## 2022-07-08 LAB — BASIC METABOLIC PANEL
Anion Gap: 8 mmol/L (ref 5–15)
Anion Gap: 8 mmol/L (ref 5–15)
BUN: 47 MG/DL — ABNORMAL HIGH (ref 6–20)
BUN: 34 MG/DL — ABNORMAL HIGH (ref 6–20)
Bun/Cre Ratio: 9 — ABNORMAL LOW (ref 12–20)
Bun/Cre Ratio: 8 — ABNORMAL LOW (ref 12–20)
CO2: 28 mmol/L (ref 21–32)
CO2: 29 mmol/L (ref 21–32)
Calcium: 8.4 MG/DL — ABNORMAL LOW (ref 8.5–10.1)
Calcium: 8.4 MG/DL — ABNORMAL LOW (ref 8.5–10.1)
Chloride: 102 mmol/L (ref 97–108)
Chloride: 99 mmol/L (ref 97–108)
Creatinine: 4.96 MG/DL — ABNORMAL HIGH (ref 0.70–1.30)
Creatinine: 4.02 MG/DL — ABNORMAL HIGH (ref 0.70–1.30)
Est, Glom Filt Rate: 11 mL/min/{1.73_m2} — ABNORMAL LOW (ref >60)
Est, Glom Filt Rate: 15 mL/min/{1.73_m2} — ABNORMAL LOW (ref >60)
Glucose: 100 mg/dL (ref 65–100)
Glucose: 136 mg/dL — ABNORMAL HIGH (ref 65–100)
Potassium: 4 mmol/L (ref 3.5–5.1)
Potassium: 3.9 mmol/L (ref 3.5–5.1)
Sodium: 138 mmol/L (ref 136–145)
Sodium: 136 mmol/L (ref 136–145)

## 2022-07-08 LAB — PHOSPHORUS: Phosphorus: 5.5 MG/DL — ABNORMAL HIGH (ref 2.6–4.7)

## 2022-07-08 LAB — MAGNESIUM: Magnesium: 2.4 mg/dL (ref 1.6–2.4)

## 2022-07-08 MED FILL — HEPARIN SODIUM (PORCINE) 5000 UNIT/ML IJ SOLN: 5000 UNIT/ML | INTRAMUSCULAR | Qty: 1

## 2022-07-08 MED FILL — BPCO EX OINT: CUTANEOUS | Qty: 60

## 2022-07-08 MED FILL — RETACRIT 3000 UNIT/ML IJ SOLN: 3000 UNIT/ML | INTRAMUSCULAR | Qty: 2

## 2022-07-08 MED FILL — CEFTRIAXONE SODIUM 2 G IJ SOLR: 2 g | INTRAMUSCULAR | Qty: 2000

## 2022-07-08 MED FILL — AMIODARONE HCL 200 MG PO TABS: 200 MG | ORAL | Qty: 1

## 2022-07-08 MED FILL — RISAQUAD-2 PO CAPS: ORAL | Qty: 1

## 2022-07-08 MED FILL — SEVELAMER CARBONATE 2.4 G PO PACK: 2.4 g | ORAL | Qty: 2.4

## 2022-07-08 MED FILL — METRONIDAZOLE 500 MG/100ML IV SOLN: 500 MG/100ML | INTRAVENOUS | Qty: 100

## 2022-07-08 MED FILL — THERA M PLUS PO TABS: ORAL | Qty: 1

## 2022-07-08 NOTE — Other (Signed)
07/08/22 0843   Treatment   Time On 0843   Treatment Goal 2kg removal; 3hrs   Observations & Evaluations   Level of Consciousness 0   Oriented X 4   Heart Rhythm Regular   Respiratory Quality/Effort Unlabored   O2 Device None (Room air)   Bilateral Breath Sounds Clear   Skin Condition/Temp Warm;Dry;Fragile   RUE Edema +1   LUE Edema +1   RLE Edema Trace   LLE Edema Trace   Comments patient states he urinated a lot this morning; required clean up; MD made aware   Vital Signs   BP (!) 157/77   Temp 97.5 F (36.4 C)   Pulse 76   Respirations 16   Pain Assessment   Pain Assessment None - Denies Pain   Technical Checks   Dialysis Machine No. 09   RO Machine Number [id number]   Dialyzer Lot No. [id number]   Tubing Lot Number [id number]   All Connections Secure Yes   NS Bag Yes   Saline Line Double Clamped Yes   Dialyzer Revaclear 300   Prime Volume (mL) 200 mL   ICEBOAT I;C;E;B;O;A;T   RO Machine Log Sheet Completed Yes   Machine Alarm Self Test Completed;Passed   Child psychotherapist Function   Extracorporeal Chemical engineer Conductivity 13.7   Manual Conductivity 13.7   Machine Ph 7.2   Bleach Test (Neg) Yes   Bath Temperature 98.6 F (37 C)   Tunneled Hemodialysis Catheter Right Subclavian   Placement Date: 07/01/22   Present on Admission/Arrival: No  Inserted by: Hope Riddell-NP  Insertion Practices: Chlorohexadine skin antisepsis;Maximal barrier precautions;Sterile ultrasound technique;Optimal catheter site selection;Hand hygiene  Orien...   Access Status  Accessed   Continued need for line? Yes   Site Assessment Clean, dry & intact   Venous Lumen Status Brisk blood return;Flushed;Infusing   Arterial Lumen Status Brisk blood return;Flushed;Infusing   Alcohol Cap Used No   Line Care Connections checked and tightened;Ports disinfected   Dressing Type Bacteriocidal;Transparent   Date of Last Dressing Change 07/08/22   Dressing Status Clean, dry & intact   Dressing Change Due 07/10/22    Treatment Initiation   Dialyze Hours 3   Treatment  Initiation Universal Precautions maintained;Lines secured to patient;Connections secured;Prime given;Venous Parameters set;Arterial Parameters set;IT consultant engaged;Saline line double clamped   During Hemodialysis Assessment   Blood Flow Rate (ml/min) 400 ml/min   Arterial Pressure (mmHg) -150 mmHg   Venous Pressure (mmHg) 120   TMP 60   DFR 600   Comments Treatment initiated   Access Visible Yes   Ultrafiltration Rate (ml/hr) 830 ml/hr   Ultrafiltration Removed (mL) 0 ml/min   Dialysis Bath   K+ (Potassium) 2   Ca+ (Calcium) 2.5   Na+ (Sodium) 140   HCO3 (Bicarb) 35   Primary RN SBAR: Carolin Sicks, RN  Patient Education: need for labs prior to HD treatment for MD to assess Renal recovery.; procedural  Hepatitis B Surface Ag   Date/Time Value Ref Range Status   06/29/2022 09:53 AM <0.10 Index Final     Hep B S Ab   Date/Time Value Ref Range Status   06/29/2022 09:53 AM <3.10 mIU/mL Final

## 2022-07-08 NOTE — Progress Notes (Signed)
Attempted to see pt for OT services.  Pt was off the floor.  Will defer but continue to follow.

## 2022-07-08 NOTE — Plan of Care (Signed)
Problem: Physical Therapy - Adult  Goal: By Discharge: Performs mobility at highest level of function for planned discharge setting.  See evaluation for individualized goals.  Description: FUNCTIONAL STATUS PRIOR TO ADMISSION: Patient was independent and active without use of DME. Drove himself to [place] from [place] for [place] race. Denies home O2 use.     HOME SUPPORT PRIOR TO ADMISSION: The patient lived with wife however wife has her own medical issues and cannot provide physical assist.    Physical Therapy Goals  Re-Assessment 07/07/22 remain appropriate  1.  Patient will move from supine to sit and sit to supine, scoot up and down, and roll side to side in bed with mod  assist  within 7 day(s).   2. Patient will sit EOB x 15 minutes with contact guard assist within 7 days.    3.  Patient will perform sit to stand with maximal assistance x2 within 7 day(s).  4.  Patient will transfer from bed to chair and chair to bed via minimal lift equipment (best mover) within 7 day(s).      Re-Assessment 06/29/22  1.  Patient will move from supine to sit and sit to supine, scoot up and down, and roll side to side in bed with maximal assist of 1 within 7 day(s).   2. Patient will sit EOB x 15 minutes with contact guard assist within 7 days.    3.  Patient will perform sit to stand with maximal assistance x2 within 7 day(s).  4.  Patient will transfer from bed to chair and chair to bed via minimal lift equipment (best mover) within 7 day(s).    Reassessed 06/20/2022   1.  Patient will move from supine to sit and sit to supine, scoot up and down, and roll side to side in bed with maximal assist of 1 within 7 day(s).   2. Patient will sit EOB x 5 minutes with contact guard assist within 7 days.    3.  Patient will perform sit to stand with maximal assistance x2 within 7 day(s).  4.  Patient will transfer from bed to chair and chair to bed via minimal lift equipment (best mover) within 7 day(s).    Initiated 06/10/2022  1.   Patient will move from supine to sit and sit to supine, scoot up and down, and roll side to side in bed with contact guard assist within 7 day(s).   2. Patient will sit EOB x 10 minutes with supervision/set-up assist within 7 days.    2.  Patient will perform sit to stand with moderate assistance x2 within 7 day(s).  3.  Patient will transfer from bed to chair and chair to bed with moderate assistance x2 using the least restrictive device within 7 day(s).  4.  Patient will ambulate with moderate assistance x2 for 5 feet with the least restrictive device within 7 day(s).   Outcome: Progressing   PHYSICAL THERAPY TREATMENT    Patient: Jaime [name] (76 y.o. male)  Date: 07/08/2022  Diagnosis: Septicemia (HCC) [A41.9]  Gastric perforation (HCC) [K25.5]  Perforated abdominal viscus [R19.8] Gastric perforation (HCC)  Procedure(s) (LRB):  LAPAROTOMY EXPLORATORY (N/A)  LAPAROSCOPY DIAGNOSTIC (N/A)  ENDOSCOPY OF ILEAL CONDUIT (N/A) 37 Days Post-Op  Precautions: Modified Diet, General Precautions, Contact Precautions, Fall Risk                    ASSESSMENT:  Patient continues to benefit from skilled PT services and is progressing towards  goals. Patient received in bed and agreeable to participate, tired from HD earlier today.  Good tolerance of strengthening exercises for LEs, worked on reaching overhead to rails, turning and using bridge position to move to [facility].  Left with call bell in reach.  Patient remains well below baseline and will benefit from rehab.          PLAN:  Patient continues to benefit from skilled intervention to address the above impairments.  Continue treatment per established plan of care.    Recommendation for discharge: (in order for the patient to meet his/her long term goals): Therapy 3 hours/day 5-7 days/week    Other factors to consider for discharge:  from [place]    IF patient discharges home will need the following DME: patient owns DME required for discharge       SUBJECTIVE:   Patient stated,  "The dialysis really wears me out."    OBJECTIVE DATA SUMMARY:   Critical Behavior:          Functional Mobility Training:  Bed Mobility:  Bed Mobility Training  Interventions: Verbal cues  Rolling: Moderate assistance  Scooting: Moderate assistance;Other (comment) (to [facility])  Transfers:     Balance:      Ambulation/Gait Training:        Neuro Re-Education:                      Activity Tolerance:   requires frequent rest breaks    After treatment:   Patient left in no apparent distress in bed, Call bell within reach, and Side rails x3      COMMUNICATION/EDUCATION:   The patient's plan of care was discussed with: registered nurse    Patient Education  Education Given To: Patient  Education Provided: Home Exercise Program  Education Method: Verbal;Demonstration  Barriers to Learning: None  Education Outcome: Verbalized understanding      Rica Koyanagi, PT  Minutes: 32

## 2022-07-08 NOTE — Progress Notes (Signed)
Braden Score 12. All of the following interventions have been implemented to prevent pressure injury:    SKIN ASSESSMENT (S)  Dual skin assessment completed at shift change: Yes  Name of second RN who completed Dual Skin Assessment: Aleah, RN  Picture of wound uploaded to EMR: Yes  Venelex ordered and given per protocol: Yes  Wound care consulted if wounds present: Yes    SURFACE (S)  Stryker air pump or specialty bed ordered: Yes  Type of bed: Air bed  Only white chux used with specialty surfaces (no green chux used): Yes  Waffle cushion used for chair positioning: Bedrest    KEEP MOVING (K)  Mobility status (Bedrest, Chairbound, UWA x 1 assist , 2 assist, Max assist): Max assist  Q2 hour turns documented: Yes  Refusals to turn and education provided documented: NA  Device used to float heels: Heels up  PT/OT consulted: Yes    INCONTINENCE (I)  Incontinence status assessed Q2 hours: Yes  External catheter in use: No  Barrier cream in use: Zinc    NUTRITION (N)  I/O's documented every 8 hours: Yes  Oral supplements ordered if appropriate: Yes  Nutrition services consulted: Yes    All concerns about new DTI's must be escalated directly to attending MD, charge nurse, CCL and nurse director.

## 2022-07-08 NOTE — Progress Notes (Addendum)
Name: Jaime [name]   MRN: [id number]  DOB: [birth year]  07/08/2022 11:19 AM      Admit Date: 05/31/2022    Admit Diagnosis: Septicemia ([place]) [A41.9]  Gastric perforation ([place]) [K25.5]  Perforated abdominal viscus [R19.8]    Assessment/Plan:     Principal Problem:    Gastric perforation ([place])  Active Problems:    Poorly controlled type 2 diabetes mellitus (HCC)    Intestinal obstruction (HCC)    Severe sepsis (HCC)    Septic shock (HCC)    Escherichia coli septicemia (HCC)    Liver abscess    Pneumoperitoneum    Acute respiratory failure with hypoxia (HCC)    AKI (acute kidney injury) ([place])    Multi-organ failure with heart failure (HCC)    Thrombocytopenia (HCC)    E coli bacteremia    Hepatic abscess    Peripheral arterial disease (HCC)    Hyperbilirubinemia    Gram negative sepsis (HCC)    Septicemia (HCC)    Aspiration pneumonia of both lower lobes (HCC)    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 *          07/08/22     Assessment:         AKI-grade 3; initiated kidney replacement therapy 7/31; permacath 8/30; currently  dialysis on a Monday Wednesday Friday schedule  Pancreatitis  Hyponatremia  DM  Anemia  Dry gangrene  Discussion/MDM: Patient with multiple medical comorbidities, each with high likelihood for morbidity and mortality if left untreated.  Patient being treated with medication that requires intensive monitoring due to increased risk for toxicity. I have reviewed patient's presenting subjective and objective findings, as well as all laboratory studies, imaging studies, and vital signs to date as well as treatment rendered and patient's response to those treatments.  In addition, prior medical, surgical and relevant social and family histories were reviewed.        Discussion:     Patient reported urinating a lot this  morning.  Labs also noted.  Creatinine was not elevated compared to before.  Stopped dialysis after 1 hour 25 minutes.  We will check for renal recovery    Plan:   Will do BMP at 4 PM today.  We will check renal panel at a.m. tomorrow   Need to establish if he has recovered kidney function versus needing ongoing dialysis as outpatient    Caren Griffins, MD    Dr Elenor Legato  Cell no- [phone]  Available on perfect serve.      Signed By: Caren Griffins, MD     July 08, 2022         Subjective:   Hospital day 37   Seen and examined on dialysis at around 10 AM  He was tolerating treatment well and had no complications  Catheter was working well  However he reported to me that he urinated a lot in the morning.  This is new for him  He was incontinent and needed help..  Creatinine is good.      Review of Systems:  Pertinent items are noted in the History of Present Illness.   Objective:    BP (!) 144/70   Pulse 87   Temp 97.8 F (36.6 C)   Resp 16   Ht 1.727 m (5' 8" )   Wt 101.3 kg (223  lb 5.2 oz)   SpO2 96%   BMI 33.96 kg/m     Physical Exam:  Physical Exam  Vitals and nursing note reviewed.   Constitutional:       Appearance: Normal appearance.   HENT:      Head: Normocephalic and atraumatic.      Nose: Nose normal.      Mouth/Throat:      Mouth: Mucous membranes are moist.   Cardiovascular:      Rate and Rhythm: Tachycardia present.      Pulses: Normal pulses.      Heart sounds: Normal heart sounds.   Pulmonary:      Effort: Pulmonary effort is normal.   Abdominal:      General: Abdomen is flat.   Musculoskeletal:      Cervical back: Neck supple.   Skin:     Findings: Lesion present.      Comments: Dry gangrene bilateral feet   Neurological:      General: No focal deficit present.      Mental Status: He is oriented to person, place, and time.      Access-told hemodialysis catheter which is working well  Recent Results (from the past 24 hour(s))   POCT Glucose    Collection Time: 07/07/22 11:48  AM   Result Value Ref Range    POC Glucose 127 (H) 65 - 117 mg/dL    Performed by: Blima Rich PCT    POCT Glucose    Collection Time: 07/07/22  6:07 PM   Result Value Ref Range    POC Glucose 119 (H) 65 - 117 mg/dL    Performed by: Blima Rich PCT    POCT Glucose    Collection Time: 07/07/22  8:16 PM   Result Value Ref Range    POC Glucose 111 65 - 117 mg/dL    Performed by: Burnett Kanaris PCT    POCT Glucose    Collection Time: 07/08/22 12:52 AM   Result Value Ref Range    POC Glucose 98 65 - 117 mg/dL    Performed by: Julio Alm RN    POCT Glucose    Collection Time: 07/08/22  6:36 AM   Result Value Ref Range    POC Glucose 92 65 - 117 mg/dL    Performed by: Julio Alm RN    CBC    Collection Time: 07/08/22  8:44 AM   Result Value Ref Range    WBC 7.8 4.1 - 11.1 K/uL    RBC 2.46 (L) 4.10 - 5.70 M/uL    Hemoglobin 7.9 (L) 12.1 - 17.0 g/dL    Hematocrit 25.6 (L) 36.6 - 50.3 %    MCV 104.1 (H) 80.0 - 99.0 FL    MCH 32.1 26.0 - 34.0 PG    MCHC 30.9 30.0 - 36.5 g/dL    RDW 17.1 (H) 11.5 - 14.5 %    Platelets 187 150 - 400 K/uL    MPV 10.3 8.9 - 12.9 FL    Nucleated RBCs 0.0 0 PER 100 WBC    nRBC 0.00 0.00 - 0.01 K/uL   Magnesium    Collection Time: 07/08/22  8:44 AM   Result Value Ref Range    Magnesium 2.4 1.6 - 2.4 mg/dL   Phosphorus    Collection Time: 07/08/22  8:44 AM   Result Value Ref Range    Phosphorus 5.5 (H) 2.6 - 4.7 MG/DL   Basic Metabolic Panel    Collection Time: 07/08/22  8:44 AM   Result Value Ref Range    Sodium 138 136 - 145 mmol/L    Potassium 4.0 3.5 - 5.1 mmol/L    Chloride 102 97 - 108 mmol/L    CO2 28 21 - 32 mmol/L    Anion Gap 8 5 - 15 mmol/L    Glucose 100 65 - 100 mg/dL    BUN 47 (H) 6 - 20 MG/DL    Creatinine 4.96 (H) 0.70 - 1.30 MG/DL    Bun/Cre Ratio 9 (L) 12 - 20      Est, Glom Filt Rate 11 (L) >60 ml/min/1.81m    Calcium 8.4 (L) 8.5 - 10.1 MG/DL   Hepatic Function Panel    Collection Time: 07/08/22  8:44 AM   Result Value Ref Range    Total Protein 6.7 6.4 - 8.2  g/dL    Albumin 2.0 (L) 3.5 - 5.0 g/dL    Globulin 4.7 (H) 2.0 - 4.0 g/dL    Albumin/Globulin Ratio 0.4 (L) 1.1 - 2.2      Total Bilirubin 1.0 0.2 - 1.0 MG/DL    Bilirubin, Direct 0.6 (H) 0.0 - 0.2 MG/DL    Alk Phosphatase 223 (H) 45 - 117 U/L    AST 29 15 - 37 U/L    ALT 16 12 - 78 U/L          Intake/Output Summary (Last 24 hours) at 07/08/2022 1119  Last data filed at 07/08/2022 1000  Gross per 24 hour   Intake 730 ml   Output 930 ml   Net -200 ml            Data Review:   Recent Labs     07/08/22  0844   NA 138   K 4.0   BUN 47*   CREATININE 4.96*   WBC 7.8   HGB 7.9*   HCT 25.6*   PLT 187         No current facility-administered medications on file prior to encounter.     Current Outpatient Medications on File Prior to Encounter   Medication Sig Dispense Refill    metFORMIN (GLUCOPHAGE) 500 MG tablet Take 1 tablet by mouth daily (Patient not taking: Reported on 06/08/2022)      metoprolol (LOPRESSOR) 100 MG tablet Take 1 tablet by mouth daily      hydrALAZINE (APRESOLINE) 25 MG tablet Take 1 tablet by mouth 3 times daily (Patient not taking: Reported on 06/08/2022)      atorvastatin (LIPITOR) 40 MG tablet Take 1 tablet by mouth daily (Patient not taking: Reported on 06/08/2022)      losartan (COZAAR) 100 MG tablet Take 1 tablet by mouth daily (Patient not taking: Reported on 06/08/2022)      metFORMIN (GLUCOPHAGE-XR) 500 MG extended release tablet Take 1 tablet by mouth daily (with breakfast) (Patient not taking: Reported on 06/08/2022)      hydroCHLOROthiazide (HYDRODIURIL) 25 MG tablet TAKE 1 TABLET BY MOUTH ONCE DAILY AS NEEDED (Patient not taking: Reported on 06/08/2022)      metoprolol succinate (TOPROL XL) 100 MG extended release tablet TAKE 1 TABLET BY MOUTH ONCE DAILY WITH OR IMMEDIATELY FOLLOWING A MEAL          Full Code  Care Plan discussed with:  Patient x    Family      RN x    Dialysis RN     Care Manager        Hospitalist    Consultant:  Comments   >50% of visit spent in counseling and coordination of  care           Signed By: Caren Griffins, MD     July 08, 2022       This dictation was done by dragon, computer voice recognition software.  Often unanticipated grammatical, syntax, phones and other interpretive errors are inadvertently transcribed.  Please excuse errors that have escaped final proofreading. Please contact me if you suspect dictation or transcription errors.      Dr Elenor Legato    Office No- [phone]  [facility]  [address]  [address]  [place] [address]    Fax: [phone]

## 2022-07-08 NOTE — Progress Notes (Signed)
[facility]  [facility]    Progress Note    Date:07/08/2022       Room:2112/01  Patient Name:Jaime [name]     Date of Birth:[birth year]     Age:76 y.o.    Subjective    Subjective   Pt seen at [facility]. Denies any new complaints. States he is pending possible transfer to his hometown in [place]. Per nursing, no acute overnight events      Review of Systems  Constitutional: No fever, No chills, No sweats, + weakness, No fatigue  Respiratory: No shortness of breath, No cough, No sputum production, No hemoptysis, No wheezing, No cyanosis.  Cardiovascular: No chest pain, No palpitations, No bradycardia, No tachycardia, + peripheral edema, No syncope.  Gastrointestinal: No nausea, No vomiting, No diarrhea, No constipation, No heartburn, No abdominal pain.  Endocrine: No excessive thirst, No polyuria, No cold intolerance, No heat intolerance, No excessive hunger.  Immunologic: No immunocompromised, No recurrent fevers, No recurrent infections.  Musculoskeletal: No back pain, No neck pain, No joint pain, No muscle pain, No claudication, No decreased range of motion, No trauma.  Integumentary: No rash, No pruritus, No abrasions.  Neurologic: Alert, No abnormal balance, No headache, No confusion, No numbness, No tingling.  Psychiatric: No anxiety, No depression, No mania.      Objective         Vitals Last 24 Hours:  TEMPERATURE:  Temp  Avg: 97.8 F (36.6 C)  Min: 97.5 F (36.4 C)  Max: 98.2 F (36.8 C)  RESPIRATIONS RANGE: Resp  Avg: 17  Min: 16  Max: 20  PULSE OXIMETRY RANGE: SpO2  Avg: 96.3 %  Min: 96 %  Max: 97 %  PULSE RANGE: Pulse  Avg: 84.2  Min: 76  Max: 101  BLOOD PRESSURE RANGE: Systolic (24hrs), Avg:134 , Min:119 , Max:157   ; Diastolic (24hrs), Avg:73, Min:61, Max:88    I/O (24Hr):    Intake/Output Summary (Last 24 hours) at 07/08/2022 2031  Last data filed at 07/08/2022 1500  Gross per 24 hour   Intake 830 ml   Output 930 ml   Net -100 ml     Objective  GEN: Pt is AAOx3 and in NAD.  Heels elevated. No family at [facility]  DERM:Bilateral dry gangrenous changes noted to the toes. No proximal lymphatic streaking. No drainage or malodor  VASC: Pedal pulses (DP/PT) diminished to B/L LE. Skin temp is warm to cool from proximal to distal for B/L LE. Neg homans/pratts signs to B/L LE. No varicosities or telangectasias noted to B/L LE.  NEURO: Protective and epicritic sensations grossly absent to B/L LE  MSK: (-) POP, No gross deformities. Decreased muscle tone and bulk noted to B/L LE.  PSYCH: Cooperative with normal mood/affect      Labs/Imaging/Diagnostics    Labs:  CBC:  Recent Labs     07/06/22  0417 07/08/22  0844   WBC 10.7 7.8   RBC 2.47* 2.46*   HGB 8.0* 7.9*   HCT 25.8* 25.6*   MCV 104.5* 104.1*   RDW 17.4* 17.1*   PLT 212 187     CHEMISTRIES:  Recent Labs     07/06/22  0417 07/08/22  0844 07/08/22  1636   NA 138 138 136   K 5.3* 4.0 3.9   CL 104 102 99   CO2 26 28 29    BUN 61* 47* 34*   CREATININE 5.23* 4.96* 4.02*   GLUCOSE 121* 100 136*  PHOS 6.8* 5.5*  --    MG 2.7* 2.4  --      PT/INR:No results for input(s): PROTIME, INR in the last 72 hours.  APTT:No results for input(s): APTT in the last 72 hours.  LIVER PROFILE:  Recent Labs     07/06/22  0417 07/08/22  0844   AST 36 29   ALT 19 16   BILIDIR 0.7* 0.6*   BILITOT 1.0 1.0   ALKPHOS 271* 223*       Imaging Last 24 Hours:  CT ABDOMEN PELVIS WO CONTRAST Additional Contrast? None    Result Date: 07/07/2022  EXAM: CT ABDOMEN PELVIS WO CONTRAST INDICATION: evaluate hepatic drain COMPARISON: 06/18/2022 IV CONTRAST: None. ORAL CONTRAST: None TECHNIQUE: Thin axial images were obtained through the abdomen and pelvis. Coronal and sagittal reformats were generated. CT dose reduction was achieved through use of a standardized protocol tailored for this examination and automatic exposure control for dose modulation. The absence of intravenous contrast material reduces the sensitivity for evaluation of the vasculature and solid organs. FINDINGS: LOWER THORAX:  Trace bilateral pleural fluid. Bibasilar atelectasis versus consolidation. LIVER: Left hepatic lobe drainage catheter in place. Small residual fluid collection measuring approximately 5.0 x 3.0 cm, decreased from prior. BILIARY TREE: Sludge and/or stones within the gallbladder. CBD is not dilated. SPLEEN: within normal limits. PANCREAS: Redemonstrated peripancreatic edema without significant change, consistent with ongoing acute pancreatitis. ADRENALS: Unremarkable. KIDNEYS/URETERS: No calculus or hydronephrosis. Left renal cyst. STOMACH: Unremarkable. SMALL BOWEL: No dilatation or wall thickening. COLON: No dilatation or wall thickening. APPENDIX: Not seen. PERITONEUM: No ascites or pneumoperitoneum. RETROPERITONEUM: No lymphadenopathy or aortic aneurysm. REPRODUCTIVE ORGANS: Unremarkable. URINARY BLADDER: No mass or calculus. BONES: No destructive bone lesion. ABDOMINAL WALL: Midline laparotomy. ADDITIONAL COMMENTS: N/A     1.  Left hepatic lobe drainage catheter in appropriate position. Small residual fluid collection, with interval decrease in size. 2.  Ongoing acute pancreatitis.    Assessment//Plan           Hospital Problems             Last Modified POA    * (Principal) Gastric perforation (HCC) 06/02/2022 Yes    Poorly controlled type 2 diabetes mellitus (HCC) 07/07/2022 Yes    Intestinal obstruction (HCC) 06/05/2022 Yes    Severe sepsis (HCC) 06/05/2022 Yes    Septic shock (HCC) 06/08/2022 Yes    Escherichia coli septicemia (HCC) 07/07/2022 Yes    Liver abscess 06/05/2022 Yes    Pneumoperitoneum 06/05/2022 Yes    Acute respiratory failure with hypoxia (HCC) 06/05/2022 Yes    AKI (acute kidney injury) (HCC) 06/05/2022 Yes    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 Yes    Thrombocytopenia (HCC) 06/05/2022 Yes    E coli bacteremia 06/08/2022 Yes    Hepatic abscess 06/08/2022 Yes    Peripheral arterial disease (HCC) 06/08/2022 Yes    Hyperbilirubinemia 06/08/2022 Yes    Gram negative sepsis (HCC) 06/08/2022 Yes    Septicemia  (HCC) 06/09/2022 Yes    Aspiration pneumonia of both lower lobes (HCC) 06/12/2022 Yes    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 Pt elected to cont with local wound care and proceed with the autoamputation process.  - Will cont to wait for demarcation of the dry gangrene. No new wound formation noted to the feet. The demarcation process is slow but pt has progressed  - Cont local wound care and offloading while in bed  - We will continue to monitor patient and update surgical plan if needed     WB Status: As tolerated for transfers to B/L LE  Wound Care: Paint with all toes with betadine daily              Britlee Skolnik A. Briella Hobday, DPM, CWSP, AACFAS    [facility]  [address], [address] [place], [place] [address]  O: [phone]  F: [phone]     [facility] (Opening Sept 2023)  [address] [place]  [place], [place] [address]  O: [phone]  F: [phone]     [facility]  [address]  [place], [place] [address]  O: [phone]  F: [phone]     * Available via [facility] 24/7      Electronically signed by Rosana Fret, DPM on 07/08/22 at 8:31 PM EDT

## 2022-07-08 NOTE — Progress Notes (Addendum)
Bedside and Verbal shift change report given to Nadara Mustard, RN  (oncoming nurse) by RN (offgoing nurse). Report included the following information Nurse Handoff Report, Index, ED Encounter Summary, Intake/Output, MAR, Recent Results, and Cardiac Rhythm NSR .      0745: Sacral wound care completed.     0900: Pt off floor for dialysis.     1100: Pt returned to floor from dialysis.     1220: Accordion drainage bag changed.     1240: PICC team called to collect pts BMP, this RN unable to draw labs x2.    End of Shift Note    Bedside shift change report given to RN (oncoming nurse) by Nadara Mustard, RN (offgoing nurse).  Report included the following information SBAR, Kardex, ED Summary, Intake/Output, MAR, Recent Results, and Cardiac Rhythm NSR    Shift worked:  [phone]     Shift summary and any significant changes:     See note above.     Pt received half dialysis treatment today.     Pt voided x1 this shift- incontinent urine.     Wound care completed.      Concerns for physician to address:       Zone phone for oncoming shift:          Activity:     Number times ambulated in hallways past shift: 0  Number of times OOB to chair past shift: 0    Cardiac:   Cardiac Monitoring: Yes           Access:  Current line(s): PIV and HD access     Genitourinary:   Urinary status: oliguric    Respiratory:      Chronic home O2 use?: NO  Incentive spirometer at bedside: NO       GI:     Current diet:  ADULT ORAL NUTRITION SUPPLEMENT; Breakfast, Lunch, Dinner; Renal Oral Supplement  DIET ONE TIME MESSAGE;  DIET ONE TIME MESSAGE;  ADULT DIET; Easy to Chew; 5 carb choices (75 gm/meal); Low Phosphorus (Less than 1000 mg)  Passing flatus: YES  Tolerating current diet: YES       Pain Management:   Patient states pain is manageable on current regimen: YES    Skin:     Interventions: specialty bed, float heels, increase time out of bed, and PT/OT consult    Patient Safety:  Fall Score:    Interventions: bed/chair alarm, assistive  device (walker, cane. etc), gripper socks, pt to call before getting OOB, and stay with me (per policy)       Length of Stay:  Expected LOS: 43  Actual LOS: 37      Nadara Mustard, RN

## 2022-07-08 NOTE — Care Coordination-Inpatient (Addendum)
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 yes   If yes, was [employer] transfer form completed and VA notified? Not service connected - not eligble under [facility]  for [facility], SNF or OP HD  Caregiver Contact:  Jaylan Hinojosa - Wife - [phone]  Discharge Caregiver contacted prior to discharge? Per patient  Care Conference needed? no  Barriers to discharge: new HD, wound vac, placement, transportation, insurance authorization        CM spoke with Jacinto Halim with [place] [facility] at [phone]- confirmed paitent is not service connected- directed CM to contact [facility] - Cm spoke with Demorest with [facility] [phone] x [phone] - patient is not eligible for VA benefits for LTACH, NH placement for OP HD - only would have hospice benefits under the [facility] [place] [facility].    CM placed a call to [facility] Coordination - [phone] was directed to assigned Care Coordinator with [facility] x [phone] - left VM and awaiting call back for assistance with placement.  Ella Jubilee, MSW    CM received a VM from Clydie Braun and CM left VM with Clydie Braun at above number with request if Francine Graven can assist with transportation to facility in [place] as patient is not able to apply for medicaid in [place] since he is not a resident here.  Awaiting call back at this time.    CM called Landscape architect for [employer] and request made for  authorization to be started at location for [facility].  CM supervisor made aware. Ella Jubilee, MSW      CM spoke with Clydie Braun with [facility] and she is not able to authorize this long distance transport for patient but she will reach out to SW team to see if they have any other suggestions. Ella Jubilee, MSW    CM informed [facility] will provide one time charity for transport to [facility] - CM spoke with wife to make her aware of authorization started for [facility] and assistance received for transport if [facility] authorizes.  Ella Jubilee, MSW

## 2022-07-08 NOTE — Progress Notes (Signed)
Hospitalist Progress Note    NAME:   Jaime [name]   DOB: 03-03-1946   MRN: [id number]     Date/Time: 07/08/2022 9:07 AM  Patient PCP: No primary care provider on file.    Estimated discharge date:9/2  Barriers: Stable for discharge snf placement , hd set up at rehab       For reference :   76 y/m with prolonged hospitalization, was admitted to surgical service on 7/31 for perforated hollow viscus underwent emergent laparotomy/ septic shock d/t peritonitis, was intubated and extubated x 2. Also had AKI needing CVVH, now on HD. Also has bilateral foot gangrene. Was transferred to hospitalist service on 8/21.      Assessment / Plan:  #Septic shock- resolved   D/t perforated viscus  S/p Laparotomy 7/31, with wound vac drainage   Liver abscess s/p IR drainage, hepatic drain to be remove !  Bacteremia   Final ID recommendation to continue:  -Ceftriaxone 2G   IV daily x 6 weeks end date 9/15  - Flagyl 500 mg po bid end date 9/8  - Pt will require repeat imaging of liver before conclusion of therapy  -Discussed with case management regarding discharge disposition - wife needs to set up financial assistance for transfer of patient to [place] . Other option could be to go to an SNF in [place] [facility] ) .  Continue labs with dialysis patient doing well continue same plan of management waiting for discharge plan          #Acute hypoxemic respiratory failure:  Intubated 7/31, extubation 8/7  -Reintubated 8/13, extubated on 8/19  -Currently on 2 liters [place]  -Weaned off o2      #Dysphagia:  -Ongoing SLP evaluation-patient tolerating small diet .   -Aspiration on FEES study  -Follow-up SLP evaluation     #Acute kidney injury:  -Now on HD  -Continue with HD  -Nephrology following-planned for permcath in am tomorrow .   -Case management informed the need to set up HD at SNF prior to discharge .  -Will follow up cmp .     #Bilateral lower extremity critical limb ischemia  Gangrene both feet  -Podiatry and vascular surgery  following  Cleared by podiatry for demarcation+ autoamputation    #Diabetes mellitus:  -Sliding scale and lantus  -Monitor FS  -Diabetes team following     #Sacral wound:  -Wound care following       Medical Decision Making:   I personally reviewed labs:na   I personally reviewed imaging: na   I personally reviewed EKG: None  Toxic drug monitoring: Monitor creatinine with abx  Discussed case with: RN, patient     Code Status: Full  DVT Prophylaxis: heparin  GI Prophylaxis:none   Subjective:     Chief Complaint / Reason for Physician Visit  Labs and charts reviewed and patient examined .   No new issues, continue moving his arm and feeds the swelling in the upper extremity improving a lot patient doing good overall         Objective:     VITALS:   Last 24hrs VS reviewed since prior progress note. Most recent are:  Patient Vitals for the past 24 hrs:   BP Temp Temp src Pulse Resp SpO2   07/08/22 0730 136/77 97.5 F (36.4 C) Axillary 85 16 96 %   07/08/22 0415 135/88 98 F (36.7 C) Oral 82 18 96 %   07/07/22 2300 (!) 144/77 98.2 F (36.8 C) Oral  86 20 --   07/07/22 1945 (!) 155/96 98.3 F (36.8 C) -- 93 -- 96 %   07/07/22 1530 (!) 144/72 98.8 F (37.1 C) Oral (!) 104 21 94 %   07/07/22 1115 126/63 98 F (36.7 C) Oral 95 17 91 %           Intake/Output Summary (Last 24 hours) at 07/08/2022 7846  Last data filed at 07/08/2022 0730  Gross per 24 hour   Intake 230 ml   Output 0 ml   Net 230 ml          I had a face to face encounter and independently examined this patient on 07/08/2022, as outlined below:  PHYSICAL EXAM:  General:          Alert, cooperative  EENT:              EOMI. Anicteric sclerae.Rt. hd catn - jugular   Resp:               CTA bilaterally, no wheezing or rales.  No accessory muscle use  CV:                  Regular  rhythm,  No edema, AS murmur   GI:                   Soft, Non distended,Mildly tender.  +Bowel sounds, wound vac in place , drain on rt upper quadrant - purulent material   Neurologic:        Alert and oriented X 3, normal speech,   Psych:   Good insight. Not anxious nor agitated  Skin:                No rashes.  No jaundice  Foot :  bilateral feet - gangrene of first second and third toes , not yet full demarcation           Reviewed most current lab test results and cultures  YES  Reviewed most current radiology test results   YES  Review and summation of old records today    NO  Reviewed patient's current orders and MAR    YES  PMH/SH reviewed - no change compared to H&P  ________________________________________________________________________  Care Plan discussed with:    Comments   Patient x    Family      RN x    Care Manager     Consultant                        Multidiciplinary team rounds were held today with case manager, nursing, pharmacist and Higher education careers adviser.  Patient's plan of care was discussed; medications were reviewed and discharge planning was addressed.     ________________________________________________________________________  Total NON critical care TIME:  52  Minutes    Total CRITICAL CARE TIME Spent:   Minutes non procedure based      Comments   >50% of visit spent in counseling and coordination of care     ________________________________________________________________________  Consepcion Hearing, MD     Procedures: see electronic medical records for all procedures/Xrays and details which were not copied into this note but were reviewed prior to creation of Plan.      LABS:  I reviewed today's most current labs and imaging studies.  Pertinent labs include:  Recent Labs     07/06/22  0417 07/08/22  0844   WBC 10.7 7.8   HGB 8.0* 7.9*  HCT 25.8* 25.6*   PLT 212 187       Recent Labs     07/06/22  0417   NA 138   K 5.3*   CL 104   CO2 26   GLUCOSE 121*   BUN 61*   CREATININE 5.23*   CALCIUM 8.7   MG 2.7*   PHOS 6.8*   LABALBU 1.9*   BILITOT 1.0   AST 36   ALT 19         Signed: Consepcion Hearing, MD

## 2022-07-08 NOTE — Plan of Care (Signed)
Problem: Safety - Adult  Goal: Free from fall injury  Outcome: Progressing     Problem: Pain  Goal: Verbalizes/displays adequate comfort level or baseline comfort level  Outcome: Progressing  Flowsheets  Taken 07/08/2022 1500  Verbalizes/displays adequate comfort level or baseline comfort level: Encourage patient to monitor pain and request assistance  Taken 07/08/2022 1115  Verbalizes/displays adequate comfort level or baseline comfort level: Encourage patient to monitor pain and request assistance  Taken 07/08/2022 0730  Verbalizes/displays adequate comfort level or baseline comfort level: Encourage patient to monitor pain and request assistance     Problem: Physical Therapy - Adult  Goal: By Discharge: Performs mobility at highest level of function for planned discharge setting.  See evaluation for individualized goals.  Description: FUNCTIONAL STATUS PRIOR TO ADMISSION: Patient was independent and active without use of DME. Drove himself to [place] from [place] for [place] race. Denies home O2 use.     HOME SUPPORT PRIOR TO ADMISSION: The patient lived with wife however wife has her own medical issues and cannot provide physical assist.    Physical Therapy Goals  Re-Assessment 07/07/22 remain appropriate  1.  Patient will move from supine to sit and sit to supine, scoot up and down, and roll side to side in bed with mod  assist  within 7 day(s).   2. Patient will sit EOB x 15 minutes with contact guard assist within 7 days.    3.  Patient will perform sit to stand with maximal assistance x2 within 7 day(s).  4.  Patient will transfer from bed to chair and chair to bed via minimal lift equipment (best mover) within 7 day(s).      Re-Assessment 06/29/22  1.  Patient will move from supine to sit and sit to supine, scoot up and down, and roll side to side in bed with maximal assist of 1 within 7 day(s).   2. Patient will sit EOB x 15 minutes with contact guard assist within 7 days.    3.  Patient will perform sit to  stand with maximal assistance x2 within 7 day(s).  4.  Patient will transfer from bed to chair and chair to bed via minimal lift equipment (best mover) within 7 day(s).    Reassessed 06/20/2022   1.  Patient will move from supine to sit and sit to supine, scoot up and down, and roll side to side in bed with maximal assist of 1 within 7 day(s).   2. Patient will sit EOB x 5 minutes with contact guard assist within 7 days.    3.  Patient will perform sit to stand with maximal assistance x2 within 7 day(s).  4.  Patient will transfer from bed to chair and chair to bed via minimal lift equipment (best mover) within 7 day(s).    Initiated 06/10/2022  1.  Patient will move from supine to sit and sit to supine, scoot up and down, and roll side to side in bed with contact guard assist within 7 day(s).   2. Patient will sit EOB x 10 minutes with supervision/set-up assist within 7 days.    2.  Patient will perform sit to stand with moderate assistance x2 within 7 day(s).  3.  Patient will transfer from bed to chair and chair to bed with moderate assistance x2 using the least restrictive device within 7 day(s).  4.  Patient will ambulate with moderate assistance x2 for 5 feet with the least restrictive device within 7 day(s).  07/08/2022 1506 by Rica Koyanagi, PT  Outcome: Progressing

## 2022-07-08 NOTE — Other (Signed)
07/08/22 1000   Treatment   Time Off 1000   Treatment Goal removed; off early r/t labs and clinical presentation per MD   Observations & Evaluations   Level of Consciousness 0   Oriented X 4   Heart Rhythm Regular   Respiratory Quality/Effort Unlabored   O2 Device None (Room air)   Bilateral Breath Sounds Clear   Skin Condition/Temp Warm;Dry;Fragile   RUE Edema Trace   LUE Edema Trace   RLE Edema Trace   LLE Edema Trace   Vital Signs   BP (!) 144/70   Temp 97.8 F (36.6 C)   Pulse 87   Respirations 16   Pain Assessment   Pain Assessment None - Denies Pain   Tunneled Hemodialysis Catheter Right Subclavian   Placement Date: 07/01/22   Present on Admission/Arrival: No  Inserted by: Hope Riddell-NP  Insertion Practices: Chlorohexadine skin antisepsis;Maximal barrier precautions;Sterile ultrasound technique;Optimal catheter site selection;Hand hygiene  Orien...   Access Status  Not Accessed   Continued need for line? Yes   Site Assessment Clean, dry & intact   Venous Lumen Status Flushed;Capped   Arterial Lumen Status Flushed;Capped   Alcohol Cap Used No   Line Care Ports disinfected;Connections checked and tightened   Dressing Type Bacteriocidal;Transparent   Date of Last Dressing Change 07/08/22   Dressing Status Clean, dry & intact   Dressing Change Due 07/10/22   Treatment Initiation   Dialyze Hours 1.25   During Hemodialysis Assessment   Ultrafiltration Removed (mL) 930 ml/min   Post-Hemodialysis Assessment   Post-Treatment Procedures Blood returned;Catheter Capped, clamped with Saline x2 ports   Machine Disinfection Process Acid/Vinegar Clean;Heat Disinfect   Rinseback Volume (ml) 300 ml   Blood Volume Processed (Liters) 25.4 L   Dialyzer Clearance Clear   Duration of Treatment (minutes) 75 minutes   Hemodialysis Intake (ml) 500 ml   Hemodialysis Output (ml) 930 ml   NET Removed (ml) 430   Tolerated Treatment Good   Interventions Taken Other (Comment)  (stopped HD early relaed to labs and larger urine  volume reported. MD ordered 4p lab today and tomorrow AM to check for renal recovery)   Physician Notified Yes   Patient Disposition Return to room     Primary RN SBAR: Vaughan Browner, RN  Comments: Rationale for shortened treatment noted above; MD watching for renal recovery.

## 2022-07-08 NOTE — Progress Notes (Signed)
Physical Therapy    Chart reviewed and attempted visit but patient is currently off the floor for HD. Will defer and continue to follow.    Jaime [name]

## 2022-07-08 NOTE — Progress Notes (Signed)
Chart reviewed and attempted to see patient for swallow treatment. He is currently off the floor for HD. Will follow as able.     Alwilda Gilland A. Haziel Molner, M.S., CCC-SLP

## 2022-07-09 ENCOUNTER — Encounter: Payer: Self-pay | Admitting: Family Medicine

## 2022-07-09 LAB — RENAL FUNCTION PANEL
Albumin: 2.1 g/dL — ABNORMAL LOW (ref 3.5–5.0)
Anion Gap: 10 mmol/L (ref 5–15)
BUN: 39 MG/DL — ABNORMAL HIGH (ref 6–20)
Bun/Cre Ratio: 9 — ABNORMAL LOW (ref 12–20)
CO2: 27 mmol/L (ref 21–32)
Calcium: 8.3 MG/DL — ABNORMAL LOW (ref 8.5–10.1)
Chloride: 99 mmol/L (ref 97–108)
Creatinine: 4.4 MG/DL — ABNORMAL HIGH (ref 0.70–1.30)
Est, Glom Filt Rate: 13 mL/min/{1.73_m2} — ABNORMAL LOW (ref >60)
Glucose: 113 mg/dL — ABNORMAL HIGH (ref 65–100)
Phosphorus: 4.5 MG/DL (ref 2.6–4.7)
Potassium: 3.7 mmol/L (ref 3.5–5.1)
Sodium: 136 mmol/L (ref 136–145)

## 2022-07-09 LAB — POCT GLUCOSE
POC Glucose: 110 mg/dL (ref 65–117)
POC Glucose: 107 mg/dL (ref 65–117)
POC Glucose: 126 mg/dL — ABNORMAL HIGH (ref 65–117)
POC Glucose: 191 mg/dL — ABNORMAL HIGH (ref 65–117)

## 2022-07-09 MED FILL — METRONIDAZOLE 500 MG/100ML IV SOLN: 500 MG/100ML | INTRAVENOUS | Qty: 100

## 2022-07-09 MED FILL — HEPARIN SODIUM (PORCINE) 5000 UNIT/ML IJ SOLN: 5000 UNIT/ML | INTRAMUSCULAR | Qty: 1

## 2022-07-09 MED FILL — AMIODARONE HCL 200 MG PO TABS: 200 MG | ORAL | Qty: 1

## 2022-07-09 MED FILL — CEFTRIAXONE SODIUM 2 G IJ SOLR: 2 g | INTRAMUSCULAR | Qty: 2000

## 2022-07-09 MED FILL — RENVELA 2.4 G PO PACK: 2.4 g | ORAL | Qty: 2.4

## 2022-07-09 MED FILL — SEVELAMER CARBONATE 2.4 G PO PACK: 2.4 g | ORAL | Qty: 2.4

## 2022-07-09 MED FILL — THERA M PLUS PO TABS: ORAL | Qty: 1

## 2022-07-09 MED FILL — RISAQUAD-2 PO CAPS: ORAL | Qty: 1

## 2022-07-09 NOTE — Progress Notes (Signed)
Hospitalist Progress Note    NAME:   Jaime [name]   DOB: June 16, 1946   MRN: [id number]     Date/Time: 07/09/2022 3:38 PM  Patient PCP: No primary care provider on file.    Estimated discharge date:9/8  Barriers: Nephrology clearance     For reference :   76 y/m with prolonged hospitalization, was admitted to surgical service on 7/31 for perforated hollow viscus underwent emergent laparotomy/ septic shock d/t peritonitis, was intubated and extubated x 2. Also had AKI needing CVVH, now on HD. Also has bilateral foot gangrene. Was transferred to hospitalist service on 8/21.      Assessment / Plan:  #Septic shock- resolved   D/t perforated viscus  S/p Laparotomy 7/31, with wound vac drainage   Liver abscess s/p IR drainage, hepatic drain to be remove !  Bacteremia   Final ID recommendation to continue:  -Ceftriaxone 2G   IV daily x 6 weeks end date 9/15  - Flagyl 500 mg po bid end date 9/8  - Pt will require repeat imaging of liver before conclusion of therapy  -Discussed with case management regarding discharge disposition - wife needs to set up financial assistance for transfer of patient to [place] . Other option could be to go to an SNF in [place] [facility] ) .  Continue labs with dialysis patient doing well continue same plan of management waiting for discharge plan          #Acute hypoxemic respiratory failure:  Intubated 7/31, extubation 8/7  -Reintubated 8/13, extubated on 8/19  -Currently on 2 liters [place]  -Weaned off o2      #Dysphagia:  -Ongoing SLP evaluation-patient tolerating small diet .   -Aspiration on FEES study  -Follow-up SLP evaluation     #Acute kidney injury:  -Now on HD  -Continue with HD  -Nephrology following  -Case management informed the need to set up HD at SNF prior to discharge .  -Will follow up cmp .     #Bilateral lower extremity critical limb ischemia  Gangrene both feet  -Podiatry and vascular surgery following  Cleared by podiatry for demarcation+ autoamputation    #Diabetes  mellitus:  -Sliding scale and lantus  -Monitor FS  -Diabetes team following     #Sacral wound:  -Wound care following       Medical Decision Making:   I personally reviewed labs:CBC and BMP   I personally reviewed imaging: na   I personally reviewed EKG: None  Toxic drug monitoring: Monitor creatinine with abx  Discussed case with: RN, patient     Code Status: Full  DVT Prophylaxis: heparin  GI Prophylaxis:none   Subjective:     Chief Complaint / Reason for Physician Visit  Labs and charts reviewed and patient examined .   No new issues, continue moving his arm and feeds the swelling in the upper extremity improving a lot patient doing good overall         Objective:     VITALS:   Last 24hrs VS reviewed since prior progress note. Most recent are:  Patient Vitals for the past 24 hrs:   BP Temp Temp src Pulse Resp SpO2   07/09/22 1045 (!) 152/86 98 F (36.7 C) Oral 95 18 97 %   07/09/22 0740 (!) 145/72 97.8 F (36.6 C) Oral 83 16 96 %   07/09/22 0259 137/74 97.2 F (36.2 C) Oral 92 17 97 %   07/08/22 2317 (!) 158/76 98.5 F (36.9 C) Axillary  95 18 96 %   07/08/22 2032 126/61 98.5 F (36.9 C) Axillary 97 18 98 %           Intake/Output Summary (Last 24 hours) at 07/09/2022 1538  Last data filed at 07/09/2022 0740  Gross per 24 hour   Intake 223.33 ml   Output --   Net 223.33 ml          I had a face to face encounter and independently examined this patient on 07/09/2022, as outlined below:  PHYSICAL EXAM:  General:          Alert, cooperative  EENT:              EOMI. Anicteric sclerae.Rt. hd catn - jugular   Resp:               CTA bilaterally, no wheezing or rales.  No accessory muscle use  CV:                  Regular  rhythm,  No edema, AS murmur   GI:                   Soft, Non distended,Mildly tender.  +Bowel sounds, wound vac in place , drain on rt upper quadrant - purulent material   Neurologic:       Alert and oriented X 3, normal speech,   Psych:   Good insight. Not anxious nor agitated  Skin:                 No rashes.  No jaundice  Foot :  bilateral feet - gangrene of first second and third toes , not yet full demarcation           Reviewed most current lab test results and cultures  YES  Reviewed most current radiology test results   YES  Review and summation of old records today    NO  Reviewed patient's current orders and MAR    YES  PMH/SH reviewed - no change compared to H&P  ________________________________________________________________________  Care Plan discussed with:    Comments   Patient x    Family      RN x    Care Manager     Consultant                        Multidiciplinary team rounds were held today with case manager, nursing, pharmacist and Higher education careers adviser.  Patient's plan of care was discussed; medications were reviewed and discharge planning was addressed.     ________________________________________________________________________  Total NON critical care TIME:  52  Minutes    Total CRITICAL CARE TIME Spent:   Minutes non procedure based      Comments   >50% of visit spent in counseling and coordination of care     ________________________________________________________________________  Terence Lux, MD     Procedures: see electronic medical records for all procedures/Xrays and details which were not copied into this note but were reviewed prior to creation of Plan.      LABS:  I reviewed today's most current labs and imaging studies.  Pertinent labs include:  Recent Labs     07/08/22  0844   WBC 7.8   HGB 7.9*   HCT 25.6*   PLT 187       Recent Labs     07/08/22  0844 07/08/22  1636 07/09/22  0309   NA 138 136 136  K 4.0 3.9 3.7   CL 102 99 99   CO2 28 29 27    GLUCOSE 100 136* 113*   BUN 47* 34* 39*   CREATININE 4.96* 4.02* 4.40*   CALCIUM 8.4* 8.4* 8.3*   MG 2.4  --   --    PHOS 5.5*  --  4.5   LABALBU 2.0*  --  2.1*   BILITOT 1.0  --   --    AST 29  --   --    ALT 16  --   --          Signed: , MD

## 2022-07-09 NOTE — Plan of Care (Signed)
Problem: Occupational Therapy - Adult  Goal: By Discharge: Performs self-care activities at highest level of function for planned discharge setting.  See evaluation for individualized goals.  Description: FUNCTIONAL STATUS PRIOR TO ADMISSION:     ADL Assistance: Independent, Ambulation Assistance: Independent, Transfer Assistance: Independent, Active Driver: Yes     HOME SUPPORT: Patient lived with spouse and was independent.    Occupational Therapy Goals:  Initiated 06/10/2022; Re-Evaluation 06/20/2022.  Weekly Reassessment 06/29/22 - Continue as below. Goal #6 added. Goals reviewed and revised PRN as per 9/7 weekly reevaluation.   1.  Patient will perform grooming at bed level with Moderate Assist within 7 day(s). Continue goal.  2.  Patient will perform self-feeding with Moderate Assist within 7 day(s). Continue goal.  3.  Patient will perform upper body dressing with Maximal Assist within 7 day(s). Continue goal.   4.  Patient will complete supine>sit with mod assist x2  within 7 day(s). Goal met, change to min A of 1.   5.  Patient will tolerate sitting EOB with fair sitting balance in preparation for ADLs within 7 day(s). Goal  met, change to good.  6.  Patient will participate in BUE therapeutic activity / exercise with Minimal Assist within 7 day(s). Goal met, change to min A.      Outcome: Progressing    OCCUPATIONAL THERAPY TREATMENT: WEEKLY REASSESSMENT    Patient: Jaime [name] 76 y.o. male)  Date: 07/09/2022  Primary Diagnosis: Septicemia ([place]) [A41.9]  Gastric perforation ([place]) [K25.5]  Perforated abdominal viscus [R19.8]  Procedure(s) (LRB):  LAPAROTOMY EXPLORATORY (N/A)  LAPAROSCOPY DIAGNOSTIC (N/A)  ENDOSCOPY OF ILEAL CONDUIT (N/A) 38 Days Post-Op   Precautions: Modified Diet, General Precautions, Contact Precautions, Fall Risk        , WBAT on B feet for bed to chair transfers only per Podiatry.   Chart, occupational therapy assessment, plan of care, and goals were reviewed.    ASSESSMENT  Patient  continues to benefit from skilled OT services and is progressing towards goals. He is very motivated, eager to maximize his functional independence and receptive to all therapy intervention. He was able to perform bed mobility today with min A of 1 to mod/max A of 2, he attempted a sit to stand transfer, but was unable to stand, he was total A for LB dressing and he washed his face seated EOB with SBA. The patient also participated 10 minutes of UE and trunk exercise while seated EOB with fair tolerance. His balance is improving during seated activity, able to perform some of his exercises and seated grooming unsupported. At this time the patient remains limited by his GW, decreased activity tolerance, decreased safety awareness and decreased balance. He will continue to benefit from acute OT and will need IPR after discharge.              PLAN  Goals have been updated based on progression since last assessment.  Patient continues to benefit from skilled intervention to address the above impairments.    Recommendations and Planned Interventions:   self care training, therapeutic activities, functional mobility training, balance training, therapeutic exercise, endurance activities, patient education, and home safety training    Frequency/Duration: OT Plan of Care: 4 times/week      Recommendation for discharge: (in order for the patient to meet his/her long term goals): Therapy 3 hours/day 5-7 days/week         OBJECTIVE DATA SUMMARY:   Cognitive/Behavioral Status:  Orientation  Orientation Level: Oriented X4  Cognition  Overall Cognitive Status: WNL    Functional Mobility and Transfers for ADLs:  Bed Mobility:  Bed Mobility Training  Rolling: Minimum assistance  Supine to Sit: Moderate assistance;Assist X1  Sit to Supine: Moderate assistance;Maximum assistance;Assist X2  Scooting: Total assistance     Transfers:    Sit to stand attempted, but patient unable.     Balance:  Balance  Sitting: Impaired  Sitting -  Static: Fair (occasional);Unsupported  Sitting - Dynamic: Fair (occasional);Unsupported    ADL Intervention:    Grooming: Stand by assistance (to wash face seated EOB)    LE Dressing: Dependent/Total    Pain Rating:  0/10     Activity Tolerance:   Fair   VSS t/o session    After treatment:   Patient left in no apparent distress in bed, Call bell within reach, and Bed/ chair alarm activated    COMMUNICATION/EDUCATION:   The patient's plan of care was discussed with: physical therapist    Patient Education  Education Provided: Role of Therapy;Plan of Care  Education Method: Verbal  Barriers to Learning: None  Education Outcome: Verbalized understanding    Thank you for this referral.  Jacques Willingham P Devory Mckinzie, OTR/L  Minutes: 17

## 2022-07-09 NOTE — Progress Notes (Signed)
Name: Jaime [name]   MRN: [id number]  DOB: 1946/08/01  07/09/2022 2:26 PM      Admit Date: 05/31/2022    Admit Diagnosis: Septicemia (HCC) [A41.9]  Gastric perforation (HCC) [K25.5]  Perforated abdominal viscus [R19.8]    Assessment/Plan:     Principal Problem:    Gastric perforation (HCC)  Active Problems:    Poorly controlled type 2 diabetes mellitus (HCC)    Intestinal obstruction (HCC)    Severe sepsis (HCC)    Septic shock (HCC)    Escherichia coli septicemia (HCC)    Liver abscess    Pneumoperitoneum    Acute respiratory failure with hypoxia (HCC)    AKI (acute kidney injury) (HCC)    Multi-organ failure with heart failure (HCC)    Thrombocytopenia (HCC)    E coli bacteremia    Hepatic abscess    Peripheral arterial disease (HCC)    Hyperbilirubinemia    Gram negative sepsis (HCC)    Septicemia (HCC)    Aspiration pneumonia of both lower lobes (HCC)    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 *          07/09/22     Assessment:         AKI-grade 3; initiated kidney replacement therapy 7/31; permacath 8/30; currently  dialysis on a Monday Wednesday Friday schedule  Pancreatitis  Hyponatremia  DM  Anemia  Dry gangrene  Discussion/MDM: Patient with multiple medical comorbidities, each with high likelihood for morbidity and mortality if left untreated.  Patient being treated with medication that requires intensive monitoring due to increased risk for toxicity. I have reviewed patient's presenting subjective and objective findings, as well as all laboratory studies, imaging studies, and vital signs to date as well as treatment rendered and patient's response to those treatments.  In addition, prior medical, surgical and relevant social and family histories were reviewed.        Discussion:     Creatinine trend postdialysis-07/08/2022 at  1636 4.02-- 07/09/22 AM at 4:40   Not much urine output recorded.  bladder scan may be needed    Plan:   We will hold off on dialysis tomorrow.  Labs in the a.m. and assess need for dialysis-outpatient placement and discharge later      Bryan Lemma, MD    Dr Dorthey Sawyer  Cell no- [phone]  Available on perfect serve.      Signed By: Bryan Lemma, MD     July 09, 2022         Subjective:   Hospital day 71   Seen and examined.  No new complaints.  Stated that he did not urinate much since yesterday    Review of Systems:  Pertinent items are noted in the History of Present Illness.   Objective:    BP (!) 152/86   Pulse 95   Temp 98 F (36.7 C) (Oral)   Resp 18   Ht 1.727 m (5\' 8" )   Wt 101.3 kg (223 lb 5.2 oz)   SpO2 97%   BMI 33.96 kg/m     Physical Exam:  Physical Exam  Vitals and nursing note reviewed.   Constitutional:       Appearance: Normal appearance.   HENT:      Head: Normocephalic and atraumatic.      Nose: Nose normal.  Mouth/Throat:      Mouth: Mucous membranes are moist.   Cardiovascular:      Rate and Rhythm: Tachycardia present.      Pulses: Normal pulses.      Heart sounds: Normal heart sounds.   Pulmonary:      Effort: Pulmonary effort is normal.   Abdominal:      General: Abdomen is flat.   Musculoskeletal:      Cervical back: Neck supple.   Skin:     Findings: Lesion present.      Comments: Dry gangrene bilateral feet   Neurological:      General: No focal deficit present.      Mental Status: He is oriented to person, place, and time.      Access-told hemodialysis catheter which is working well  Recent Results (from the past 24 hour(s))   Basic Metabolic Panel    Collection Time: 07/08/22  4:36 PM   Result Value Ref Range    Sodium 136 136 - 145 mmol/L    Potassium 3.9 3.5 - 5.1 mmol/L    Chloride 99 97 - 108 mmol/L    CO2 29 21 - 32 mmol/L    Anion Gap 8 5 - 15 mmol/L    Glucose 136 (H) 65 - 100 mg/dL    BUN 34 (H) 6 - 20 MG/DL    Creatinine 2.20 (H) 0.70 - 1.30 MG/DL     Bun/Cre Ratio 8 (L) 12 - 20      Est, Glom Filt Rate 15 (L) >60 ml/min/1.74m2    Calcium 8.4 (L) 8.5 - 10.1 MG/DL   POCT Glucose    Collection Time: 07/09/22 12:29 AM   Result Value Ref Range    POC Glucose 110 65 - 117 mg/dL    Performed by: Phoebe Sharps PCT    Renal Function Panel    Collection Time: 07/09/22  3:09 AM   Result Value Ref Range    Sodium 136 136 - 145 mmol/L    Potassium 3.7 3.5 - 5.1 mmol/L    Chloride 99 97 - 108 mmol/L    CO2 27 21 - 32 mmol/L    Anion Gap 10 5 - 15 mmol/L    Glucose 113 (H) 65 - 100 mg/dL    BUN 39 (H) 6 - 20 MG/DL    Creatinine 2.54 (H) 0.70 - 1.30 MG/DL    Bun/Cre Ratio 9 (L) 12 - 20      Est, Glom Filt Rate 13 (L) >60 ml/min/1.57m2    Calcium 8.3 (L) 8.5 - 10.1 MG/DL    Phosphorus 4.5 2.6 - 4.7 MG/DL    Albumin 2.1 (L) 3.5 - 5.0 g/dL   POCT Glucose    Collection Time: 07/09/22  6:02 AM   Result Value Ref Range    POC Glucose 107 65 - 117 mg/dL    Performed by: Phoebe Sharps PCT    POCT Glucose    Collection Time: 07/09/22 12:12 PM   Result Value Ref Range    POC Glucose 126 (H) 65 - 117 mg/dL    Performed by: Wanda Plump PCT           Intake/Output Summary (Last 24 hours) at 07/09/2022 1426  Last data filed at 07/09/2022 0740  Gross per 24 hour   Intake 553.33 ml   Output 0 ml   Net 553.33 ml            Data Review:   Recent Labs  07/08/22  0844 07/08/22  1636 07/09/22  0309   NA 138   < > 136   K 4.0   < > 3.7   BUN 47*   < > 39*   CREATININE 4.96*   < > 4.40*   WBC 7.8  --   --    HGB 7.9*  --   --    HCT 25.6*  --   --    PLT 187  --   --     < > = values in this interval not displayed.         No current facility-administered medications on file prior to encounter.     Current Outpatient Medications on File Prior to Encounter   Medication Sig Dispense Refill    metFORMIN (GLUCOPHAGE) 500 MG tablet Take 1 tablet by mouth daily (Patient not taking: Reported on 06/08/2022)      metoprolol (LOPRESSOR) 100 MG tablet Take 1 tablet by mouth daily      hydrALAZINE (APRESOLINE) 25  MG tablet Take 1 tablet by mouth 3 times daily (Patient not taking: Reported on 06/08/2022)      atorvastatin (LIPITOR) 40 MG tablet Take 1 tablet by mouth daily (Patient not taking: Reported on 06/08/2022)      losartan (COZAAR) 100 MG tablet Take 1 tablet by mouth daily (Patient not taking: Reported on 06/08/2022)      metFORMIN (GLUCOPHAGE-XR) 500 MG extended release tablet Take 1 tablet by mouth daily (with breakfast) (Patient not taking: Reported on 06/08/2022)      hydroCHLOROthiazide (HYDRODIURIL) 25 MG tablet TAKE 1 TABLET BY MOUTH ONCE DAILY AS NEEDED (Patient not taking: Reported on 06/08/2022)      metoprolol succinate (TOPROL XL) 100 MG extended release tablet TAKE 1 TABLET BY MOUTH ONCE DAILY WITH OR IMMEDIATELY FOLLOWING A MEAL          Full Code  Care Plan discussed with:  Patient x    Family      RN x    Dialysis RN     Care Manager        Hospitalist    Consultant:            Comments   >50% of visit spent in counseling and coordination of care           Signed By: Bryan Lemma, MD     July 09, 2022       This dictation was done by dragon, computer voice recognition software.  Often unanticipated grammatical, syntax, phones and other interpretive errors are inadvertently transcribed.  Please excuse errors that have escaped final proofreading. Please contact me if you suspect dictation or transcription errors.      Dr Dorthey Sawyer    Office No- [phone]  [facility]  [address]  [address]  [place], [place] [address]    Fax: [phone]

## 2022-07-09 NOTE — Progress Notes (Signed)
Braden Score 12. All of the following interventions have been implemented to prevent pressure injury:    SKIN ASSESSMENT (S)  Dual skin assessment completed at shift change: Yes  Name of second RN who completed Dual Skin Assessment: Herbert Seta, Rn  Picture of wound uploaded to EMR: Yes  Venelex ordered and given per protocol: Yes  Wound care consulted if wounds present: Yes    SURFACE (S)  Stryker air pump or specialty bed ordered: Yes  Type of bed: Air bed  Only white chux used with specialty surfaces (no green chux used): Yes  Waffle cushion used for chair positioning: Bedrest    KEEP MOVING (K)  Mobility status (Bedrest, Chairbound, UWA x 1 assist , 2 assist, Max assist): Max assist  Q2 hour turns documented: Yes  Refusals to turn and education provided documented: Yes  Device used to float heels: Heels up  PT/OT consulted: Yes    INCONTINENCE (I)  Incontinence status assessed Q2 hours: Yes  External catheter in use: No  Barrier cream in use: Yes    NUTRITION (N)  I/O's documented every 8 hours: Yes  Oral supplements ordered if appropriate: Yes  Nutrition services consulted: Yes    All concerns about new DTI's must be escalated directly to attending MD, charge nurse, CCL and nurse director.

## 2022-07-09 NOTE — Progress Notes (Signed)
Speech Pathology Note    Chart reviewed and attempted to see patient for dysphagia treatment, however patient declined to participate with SLP on this date, stating he wasn't ready for lunch yet. Will defer and will continue to follow. Thank you.    Katy Apo, M.S., CCC-SLP

## 2022-07-09 NOTE — Progress Notes (Signed)
[facility]  [facility]    Progress Note    Date:07/09/2022       Room:2112/01  Patient Name:Jaime [name]     Date of Birth:1946/06/09     Age:76 y.o.    Subjective    Subjective   Pt seen at [facility]. Denies any new complaints. Pending possible transfer to his hometown in [place]. Per nursing, no acute overnight events      Review of Systems  Constitutional: No fever, No chills, No sweats, + weakness, No fatigue  Respiratory: No shortness of breath, No cough, No sputum production, No hemoptysis, No wheezing, No cyanosis.  Cardiovascular: No chest pain, No palpitations, No bradycardia, No tachycardia, + peripheral edema, No syncope.  Gastrointestinal: No nausea, No vomiting, No diarrhea, No constipation, No heartburn, No abdominal pain.  Endocrine: No excessive thirst, No polyuria, No cold intolerance, No heat intolerance, No excessive hunger.  Immunologic: No immunocompromised, No recurrent fevers, No recurrent infections.  Musculoskeletal: No back pain, No neck pain, No joint pain, No muscle pain, No claudication, No decreased range of motion, No trauma.  Integumentary: No rash, No pruritus, No abrasions.  Neurologic: Alert, No abnormal balance, No headache, No confusion, No numbness, No tingling.  Psychiatric: No anxiety, No depression, No mania.      Objective         Vitals Last 24 Hours:  TEMPERATURE:  Temp  Avg: 98 F (36.7 C)  Min: 97.2 F (36.2 C)  Max: 98.5 F (36.9 C)  RESPIRATIONS RANGE: Resp  Avg: 17.4  Min: 16  Max: 18  PULSE OXIMETRY RANGE: SpO2  Avg: 96.8 %  Min: 96 %  Max: 98 %  PULSE RANGE: Pulse  Avg: 92.4  Min: 83  Max: 97  BLOOD PRESSURE RANGE: Systolic (24hrs), Avg:144 , Min:126 , Max:158   ; Diastolic (24hrs), Avg:74, Min:61, Max:86    I/O (24Hr):    Intake/Output Summary (Last 24 hours) at 07/09/2022 1526  Last data filed at 07/09/2022 0740  Gross per 24 hour   Intake 223.33 ml   Output --   Net 223.33 ml     Objective  GEN: Pt is AAOx3 and in NAD. Heels elevated.  No family at [facility]  DERM:Bilateral dry gangrenous changes noted to the toes. No proximal lymphatic streaking. No drainage or malodor  VASC: Pedal pulses (DP/PT) diminished to B/L LE. Skin temp is warm to cool from proximal to distal for B/L LE. Neg homans/pratts signs to B/L LE. No varicosities or telangectasias noted to B/L LE.  NEURO: Protective and epicritic sensations grossly absent to B/L LE  MSK: (-) POP, No gross deformities. Decreased muscle tone and bulk noted to B/L LE.  PSYCH: Cooperative with normal mood/affect      Labs/Imaging/Diagnostics    Labs:  CBC:  Recent Labs     07/08/22  0844   WBC 7.8   RBC 2.46*   HGB 7.9*   HCT 25.6*   MCV 104.1*   RDW 17.1*   PLT 187     CHEMISTRIES:  Recent Labs     07/08/22  0844 07/08/22  1636 07/09/22  0309   NA 138 136 136   K 4.0 3.9 3.7   CL 102 99 99   CO2 28 29 27    BUN 47* 34* 39*   CREATININE 4.96* 4.02* 4.40*   GLUCOSE 100 136* 113*   PHOS 5.5*  --  4.5   MG 2.4  --   --  PT/INR:No results for input(s): PROTIME, INR in the last 72 hours.  APTT:No results for input(s): APTT in the last 72 hours.  LIVER PROFILE:  Recent Labs     07/08/22  0844   AST 29   ALT 16   BILIDIR 0.6*   BILITOT 1.0   ALKPHOS 223*       Imaging Last 24 Hours:  CT ABDOMEN PELVIS WO CONTRAST Additional Contrast? None    Result Date: 07/07/2022  EXAM: CT ABDOMEN PELVIS WO CONTRAST INDICATION: evaluate hepatic drain COMPARISON: 06/18/2022 IV CONTRAST: None. ORAL CONTRAST: None TECHNIQUE: Thin axial images were obtained through the abdomen and pelvis. Coronal and sagittal reformats were generated. CT dose reduction was achieved through use of a standardized protocol tailored for this examination and automatic exposure control for dose modulation. The absence of intravenous contrast material reduces the sensitivity for evaluation of the vasculature and solid organs. FINDINGS: LOWER THORAX: Trace bilateral pleural fluid. Bibasilar atelectasis versus consolidation. LIVER: Left hepatic lobe drainage  catheter in place. Small residual fluid collection measuring approximately 5.0 x 3.0 cm, decreased from prior. BILIARY TREE: Sludge and/or stones within the gallbladder. CBD is not dilated. SPLEEN: within normal limits. PANCREAS: Redemonstrated peripancreatic edema without significant change, consistent with ongoing acute pancreatitis. ADRENALS: Unremarkable. KIDNEYS/URETERS: No calculus or hydronephrosis. Left renal cyst. STOMACH: Unremarkable. SMALL BOWEL: No dilatation or wall thickening. COLON: No dilatation or wall thickening. APPENDIX: Not seen. PERITONEUM: No ascites or pneumoperitoneum. RETROPERITONEUM: No lymphadenopathy or aortic aneurysm. REPRODUCTIVE ORGANS: Unremarkable. URINARY BLADDER: No mass or calculus. BONES: No destructive bone lesion. ABDOMINAL WALL: Midline laparotomy. ADDITIONAL COMMENTS: N/A     1.  Left hepatic lobe drainage catheter in appropriate position. Small residual fluid collection, with interval decrease in size. 2.  Ongoing acute pancreatitis.      Assessment//Plan           Hospital Problems             Last Modified POA    * (Principal) Gastric perforation (HCC) 06/02/2022 Yes    Poorly controlled type 2 diabetes mellitus (HCC) 07/07/2022 Yes    Intestinal obstruction (HCC) 06/05/2022 Yes    Severe sepsis (HCC) 06/05/2022 Yes    Septic shock (HCC) 06/08/2022 Yes    Escherichia coli septicemia (HCC) 07/07/2022 Yes    Liver abscess 06/05/2022 Yes    Pneumoperitoneum 06/05/2022 Yes    Acute respiratory failure with hypoxia (HCC) 06/05/2022 Yes    AKI (acute kidney injury) (HCC) 06/05/2022 Yes    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 Yes    Thrombocytopenia (HCC) 06/05/2022 Yes    E coli bacteremia 06/08/2022 Yes    Hepatic abscess 06/08/2022 Yes    Peripheral arterial disease (HCC) 06/08/2022 Yes    Hyperbilirubinemia 06/08/2022 Yes    Gram negative sepsis (HCC) 06/08/2022 Yes    Septicemia (HCC) 06/09/2022 Yes    Aspiration pneumonia of both lower lobes (HCC) 06/12/2022 Yes    Gangrene of toe of both  feet (HCC) 06/12/2022 Yes    Bandemia 06/12/2022 Yes    Acute pancreatitis 06/18/2022 Yes    Wound dehiscence 06/22/2022 Yes     Assessment & Plan    Gangrene, toes to bilateral feet 2/2 recent pressor support  DM T2        Patient seen and evaluated at bedside in the ICU  - Current labs personally reviewed  - Will cont to wait for demarcation of the dry gangrene. No new wound formation noted to the feet. The  demarcation process is slow but pt has progressed  - Cont local wound care and offloading while in bed  - Pt stable for DC from podiatry standpoint with outpatient follow up with a podiatrist near his home or with me at the [facility] on Tuesday afternoons  - I will cont to follow and update recs as needed     WB Status: As tolerated for transfers to B/L LE  Wound Care: Paint with all toes with betadine daily              Wyvonne Carda A. Savyon Loken, DPM, CWSP, AACFAS    [facility]  [address] [place], [place] [address]  O: [phone]  F: [phone]     [facility] (Opening Sept 2023)  [address] [place], [address]  [place], [place] [address]  O: [phone]  F: [phone]     [facility]  [address]  [place], [place] [address]  O: [phone]  F: [phone]     * Available via [facility] 24/7      Electronically signed by Rosana Fret, DPM on 07/09/22 at 3:26 PM EDT

## 2022-07-09 NOTE — Progress Notes (Addendum)
Bedside and Verbal shift change report given to Nadara Mustard, RN  (oncoming nurse) by Susa Raring (offgoing nurse). Report included the following information Nurse Handoff Report, Index, ED Encounter Summary, Intake/Output, MAR, Recent Results, and Cardiac Rhythm NSR .      0950: Wound care completed to feet bilaterally.     End of Shift Note    Bedside shift change report given to RN (oncoming nurse) by Nadara Mustard, RN (offgoing nurse).  Report included the following information SBAR, Kardex, ED Summary, Intake/Output, MAR, Recent Results, and Cardiac Rhythm NSR    Shift worked:  [phone]     Shift summary and any significant changes:     No significant changes.     Pt did not void this shift.     Wound care completed to sacrum with each incontinence care.     Concerns for physician to address:       Zone phone for oncoming shift:          Activity:     Number times ambulated in hallways past shift: 0  Number of times OOB to chair past shift: 0    Cardiac:   Cardiac Monitoring: Yes           Access:  Current line(s): PIV     Genitourinary:   Urinary status: oliguric    Respiratory:      Chronic home O2 use?: NO  Incentive spirometer at bedside: NO       GI:     Current diet:  ADULT ORAL NUTRITION SUPPLEMENT; Breakfast, Lunch, Dinner; Renal Oral Supplement  DIET ONE TIME MESSAGE;  DIET ONE TIME MESSAGE;  ADULT DIET; Easy to Chew; 5 carb choices (75 gm/meal); Low Phosphorus (Less than 1000 mg)  Passing flatus: YES  Tolerating current diet: YES       Pain Management:   Patient states pain is manageable on current regimen: YES    Skin:     Interventions: specialty bed, float heels, increase time out of bed, foam dressing, PT/OT consult, and limit briefs    Patient Safety:  Fall Score:    Interventions: bed/chair alarm, assistive device (walker, cane. etc), gripper socks, pt to call before getting OOB, and stay with me (per policy)       Length of Stay:  Expected LOS: 43  Actual LOS: 38      Nadara Mustard,  RN

## 2022-07-09 NOTE — Plan of Care (Signed)
Problem: Physical Therapy - Adult  Goal: By Discharge: Performs mobility at highest level of function for planned discharge setting.  See evaluation for individualized goals.  Description: FUNCTIONAL STATUS PRIOR TO ADMISSION: Patient was independent and active without use of DME. Drove himself to [place] from [place] for [place] race. Denies home O2 use.     HOME SUPPORT PRIOR TO ADMISSION: The patient lived with wife however wife has her own medical issues and cannot provide physical assist.    Physical Therapy Goals  Re-Assessment 07/07/22 remain appropriate  1.  Patient will move from supine to sit and sit to supine, scoot up and down, and roll side to side in bed with mod  assist  within 7 day(s).   2. Patient will sit EOB x 15 minutes with contact guard assist within 7 days.    3.  Patient will perform sit to stand with maximal assistance x2 within 7 day(s).  4.  Patient will transfer from bed to chair and chair to bed via minimal lift equipment (best mover) within 7 day(s).      Re-Assessment 06/29/22  1.  Patient will move from supine to sit and sit to supine, scoot up and down, and roll side to side in bed with maximal assist of 1 within 7 day(s).   2. Patient will sit EOB x 15 minutes with contact guard assist within 7 days.    3.  Patient will perform sit to stand with maximal assistance x2 within 7 day(s).  4.  Patient will transfer from bed to chair and chair to bed via minimal lift equipment (best mover) within 7 day(s).    Reassessed 06/20/2022   1.  Patient will move from supine to sit and sit to supine, scoot up and down, and roll side to side in bed with maximal assist of 1 within 7 day(s).   2. Patient will sit EOB x 5 minutes with contact guard assist within 7 days.    3.  Patient will perform sit to stand with maximal assistance x2 within 7 day(s).  4.  Patient will transfer from bed to chair and chair to bed via minimal lift equipment (best mover) within 7 day(s).    Initiated 06/10/2022  1.   Patient will move from supine to sit and sit to supine, scoot up and down, and roll side to side in bed with contact guard assist within 7 day(s).   2. Patient will sit EOB x 10 minutes with supervision/set-up assist within 7 days.    2.  Patient will perform sit to stand with moderate assistance x2 within 7 day(s).  3.  Patient will transfer from bed to chair and chair to bed with moderate assistance x2 using the least restrictive device within 7 day(s).  4.  Patient will ambulate with moderate assistance x2 for 5 feet with the least restrictive device within 7 day(s).   Outcome: Progressing   PHYSICAL THERAPY TREATMENT    Patient: Jaime [name] (76 y.o. male)  Date: 07/09/2022  Diagnosis: Septicemia (HCC) [A41.9]  Gastric perforation (HCC) [K25.5]  Perforated abdominal viscus [R19.8] Gastric perforation (HCC)  Procedure(s) (LRB):  LAPAROTOMY EXPLORATORY (N/A)  LAPAROSCOPY DIAGNOSTIC (N/A)  ENDOSCOPY OF ILEAL CONDUIT (N/A) 38 Days Post-Op  Precautions: Modified Diet, General Precautions, Contact Precautions, Fall Risk                    ASSESSMENT:  Patient continues to benefit from skilled PT services and is progressing towards  goals. Patient received sitting EOB with OT in room, now able to balance unsupported and can tolerate mild challenges  without LOB.  Attempted coming to stand x two trials with max assist x 2, only able to barely clear bed.  Also attempted scooting but patient unable to move towards [facility].  Returned to supine , able to demonstrate rolling with min assist, left in sidelying position to unweight sacrum.  Patient continues to be well below baseline and will be an excellent rehab candidate, is making progress and very motivated to improve.           PLAN:  Patient continues to benefit from skilled intervention to address the above impairments.  Continue treatment per established plan of care.    Recommendation for discharge: (in order for the patient to meet his/her long term goals): Therapy 3  hours/day 5-7 days/week    Other factors to consider for discharge: patient's current support system is unable to meet their requirements for physical assistance, high risk for falls, not safe to be alone, and concern for safely navigating or managing the home environment    IF patient discharges home will need the following DME: continuing to assess with progress       SUBJECTIVE:   Patient stated, "That wore me out."    OBJECTIVE DATA SUMMARY:   Critical Behavior:  Orientation  Orientation Level: Oriented X4  Cognition  Overall Cognitive Status: WNL    Functional Mobility Training:  Bed Mobility:  Bed Mobility Training  Rolling: Minimum assistance  Supine to Sit: Moderate assistance;Assist X1  Sit to Supine: Moderate assistance;Maximum assistance;Assist X2  Scooting: Total assistance  Transfers:     Balance:  Balance  Sitting: Impaired  Sitting - Static: Fair (occasional);Unsupported  Sitting - Dynamic: Fair (occasional);Unsupported  Standing:  (attempted standing with max assist x 2, just barely cleared bed)   Ambulation/Gait Training:        Neuro Re-Education:                      Activity Tolerance:   Good    After treatment:   Patient left in no apparent distress in bed, Call bell within reach, Bed/ chair alarm activated, and Side rails x3      COMMUNICATION/EDUCATION:   The patient's plan of care was discussed with: occupational therapist and registered nurse           Rica Koyanagi, PT  Minutes: [phone]

## 2022-07-09 NOTE — Progress Notes (Signed)
Infectious Disease Progress    Impression      Hepatic abscess  Gas and fluid containing collection 7.1 x 10.8 x 5.4 cm  In anterior left lobe of liver, patchy areas of hypodensity right lobe  4 x 4.5 x 5.5 cm collection  S/p drainage by IR 8/4.Cultures + for  few E.coli  Repeat CT 8/7 showed reduction in size of hepatic abscess  Plan was for ceftriaxone IV x6 weeks end date 9/15, Flagyl  Until 8/18, now continued due to persistent leukocytosis  CT 8/13 + for    S/p percutaneous drainage of left hepatic abscess with  Minimal fluid remaining around catheter, suggestion of peripancreatic edema, small bilateral pleural effusions  Repeat CT abdomen pelvis 8/16+ for increased peripancreatic edema suspicious for pancreatitis, small residual fluid collection in left hepatic lobe  Lipase 737        Sepsis  Septic shock.  Resolved    E. coli bacteremia  Blood cultures 7/31+ for E. coli   2/2 LAC (pan sensitive)   Negative repeat cultures 8/4   Treated.    Acute abdomen  Pneumoperitoneum  S/p diagnostic laparoscopy, laparotomy  EGD 7/31  Findings of cloudy fluid in right upper quadrant, yellow/white  Inflammatory peel over lesser curve of stomach, inferior lobe of liver  No gross perforation identified  Pancreas and retroperitoneum appeared edematous  Intra-Op fluid culture + for E. Coli (pansensitive)    S/p abdominal wound dehiscence  Clean, no purulence per wound care  Cultures 8/17, 8/18-NG.    Leukocytosis   Now resolved wbc 10.7      Gangrene, discoloration of toes  Persist      Acute hypoxic respiratory failure  Re intubated 8/13, extubated 8/19  Initially intubated 7/31, s/p extubation 8/7  CXR  8/20 + + for atelectasis  Resp cultures  8/13+ for light yeast  Apparent C.albicans/ dubliensis        AKI  Cr 4.4 on HD    Coagulopathy  Improving    Thrombocytopenia  resolved      Transaminitis  Improving    Hyperbilirubinemia   Improved  Bilirubin 1.1    Diabetes type 2  Hyperglycemia  A1c  7.7    Diarrhea  resolved    Obesity  BMI 34.76  Plan  Continue ceftriaxone 2 g IV daily  Pt  for   6 weeks of coverage of  liver abscess planned end date 9/15.   Continue Flagyl given complicated history & persistent leucocytosis  Patient would require repeat imaging prior to DC of antimicrobials.  We will plan for repeat CT to next week if patient is here.    May DC from ID standpoint once evaluated by IR.  Marland Kitchen  Antimicrobial orders for discharge  -Ceftriaxone 2G   IV daily x 6 weeks end date 9/15  - Flagyl 500 mg po bid end date 9/8  - Pt will require repeat imaging of liver before conclusion of therapy  -Weekly CBC, CMP-  -Encourage adequate fluids, daily probiotic/yogurt  -ID follow-up - if pt remains in [facility] [place] call [phone]   For follow up.    Possible adverse effects of long term antibiotics are inclusive of but not limited to following  BM suppression, neutropenia , cytopenias , aplastic anemia hemorrhage liver & renal dysfunction/ liver , renal failure  , GI dysfunction- N, V  Diarrhea,C.difficile disease, rash , allergy , anaphylaxis.toxic epidermal necrolysis  Neuro toxicity , seizure disorder  Side effects tend to be  more pronounced in the elderly  Pt to report persistent symptoms such as N,V , diarrhea, numbness tingling of extremities  or any other symptoms            Extensive review of chart notes, labs, imaging, cultures done  Additionally review of done:      Patient seen today.  Awake, denies new complaints.  Accordion drain not seen?s/p removal  Patient unaware.    History reviewed. No pertinent past medical history.    Past Surgical History:   Procedure Laterality Date    BLADDER SURGERY N/A 06/01/2022    ENDOSCOPY OF ILEAL CONDUIT performed by Guinevere Ferrari, MD at [facility] OR    CT VISCERAL PERCUTANEOUS DRAIN  06/05/2022    CT VISCERAL PERCUTANEOUS DRAIN 06/05/2022 [facility] CT    IR TUNNELED CATHETER PLACEMENT GREATER THAN 5 YEARS  07/01/2022    IR TUNNELED CATHETER PLACEMENT GREATER THAN 5  YEARS 07/01/2022 [facility], APRN - NP [facility] ANGIO IR    LAPAROSCOPY N/A 06/01/2022    LAPAROSCOPY DIAGNOSTIC performed by Guinevere Ferrari, MD at [facility]    LAPAROTOMY N/A 06/01/2022    LAPAROTOMY EXPLORATORY performed by Guinevere Ferrari, MD at [facility]       Allergies   Allergen Reactions    Augmentin [Amoxicillin-Pot Clavulanate] Hives     Tolerated ceftriaxone 06/2022    Codeine      Unknown reaction       Social Connections: Not on file       No family status information on file.           Review of Systems - Negative except those mentioned in H&P      PHYSICAL EXAM:  General:          Awake, in no distress  EENT:              EOMI. Anicteric sclerae. MMM  Resp:               CTA bilaterally, no wheezing or rales.  No accessory muscle use  CV:                  Regular  rhythm,  No edema  GI:                   Soft, Non distended, Non tender.  +Bowel sounds  Neurologic:      Alert and oriented X 3, normal speech,   Psych:             Good insight. Not anxious nor agitated  Skin:                No rashes.  No jaundice.  Extremities :  No edema, discoloration of  toes++.   Drain+ for green thick fluid++  Waymon Amato, MD Jerrel Ivory

## 2022-07-09 NOTE — Plan of Care (Signed)
Problem: Safety - Adult  Goal: Free from fall injury  Outcome: Progressing     Problem: Pain  Goal: Verbalizes/displays adequate comfort level or baseline comfort level  Outcome: Progressing  Flowsheets  Taken 07/09/2022 1555  Verbalizes/displays adequate comfort level or baseline comfort level: Encourage patient to monitor pain and request assistance  Taken 07/09/2022 1045  Verbalizes/displays adequate comfort level or baseline comfort level: Encourage patient to monitor pain and request assistance  Taken 07/09/2022 0740  Verbalizes/displays adequate comfort level or baseline comfort level: Encourage patient to monitor pain and request assistance     Problem: Discharge Planning  Goal: Discharge to home or other facility with appropriate resources  Outcome: Progressing     Problem: Physical Therapy - Adult  Goal: By Discharge: Performs mobility at highest level of function for planned discharge setting.  See evaluation for individualized goals.  Description: FUNCTIONAL STATUS PRIOR TO ADMISSION: Patient was independent and active without use of DME. Drove himself to [place] from [place] for NASCAR race. Denies home O2 use.     HOME SUPPORT PRIOR TO ADMISSION: The patient lived with wife however wife has her own medical issues and cannot provide physical assist.    Physical Therapy Goals  Re-Assessment 07/07/22 remain appropriate  1.  Patient will move from supine to sit and sit to supine, scoot up and down, and roll side to side in bed with mod  assist  within 7 day(s).   2. Patient will sit EOB x 15 minutes with contact guard assist within 7 days.    3.  Patient will perform sit to stand with maximal assistance x2 within 7 day(s).  4.  Patient will transfer from bed to chair and chair to bed via minimal lift equipment (best mover) within 7 day(s).      Re-Assessment 06/29/22  1.  Patient will move from supine to sit and sit to supine, scoot up and down, and roll side to side in bed with maximal assist of 1 within  7 day(s).   2. Patient will sit EOB x 15 minutes with contact guard assist within 7 days.    3.  Patient will perform sit to stand with maximal assistance x2 within 7 day(s).  4.  Patient will transfer from bed to chair and chair to bed via minimal lift equipment (best mover) within 7 day(s).    Reassessed 06/20/2022   1.  Patient will move from supine to sit and sit to supine, scoot up and down, and roll side to side in bed with maximal assist of 1 within 7 day(s).   2. Patient will sit EOB x 5 minutes with contact guard assist within 7 days.    3.  Patient will perform sit to stand with maximal assistance x2 within 7 day(s).  4.  Patient will transfer from bed to chair and chair to bed via minimal lift equipment (best mover) within 7 day(s).    Initiated 06/10/2022  1.  Patient will move from supine to sit and sit to supine, scoot up and down, and roll side to side in bed with contact guard assist within 7 day(s).   2. Patient will sit EOB x 10 minutes with supervision/set-up assist within 7 days.    2.  Patient will perform sit to stand with moderate assistance x2 within 7 day(s).  3.  Patient will transfer from bed to chair and chair to bed with moderate assistance x2 using the least restrictive device within 7 day(s).  4.  Patient will ambulate with moderate assistance x2 for 5 feet with the least restrictive device within 7 day(s).   07/09/2022 1329 by Ovidio Kin, PT  Outcome: Progressing     Problem: Occupational Therapy - Adult  Goal: By Discharge: Performs self-care activities at highest level of function for planned discharge setting.  See evaluation for individualized goals.  Description: FUNCTIONAL STATUS PRIOR TO ADMISSION:     ADL Assistance: Independent, Ambulation Assistance: Independent, Transfer Assistance: Independent, Active Driver: Yes     HOME SUPPORT: Patient lived with spouse and was independent.    Occupational Therapy Goals:  Initiated 06/10/2022; Re-Evaluation 06/20/2022.  Weekly Reassessment  06/29/22 - Continue as below. Goal #6 added. Goals reviewed and revised PRN as per 9/7 weekly reevaluation.   1.  Patient will perform grooming at bed level with Moderate Assist within 7 day(s). Continue goal.  2.  Patient will perform self-feeding with Moderate Assist within 7 day(s). Continue goal.  3.  Patient will perform upper body dressing with Maximal Assist within 7 day(s). Continue goal.   4.  Patient will complete supine>sit with mod assist x2  within 7 day(s). Goal met, change to min A of 1.   5.  Patient will tolerate sitting EOB with fair sitting balance in preparation for ADLs within 7 day(s). Goal  met, change to good.  6.  Patient will participate in BUE therapeutic activity / exercise with Minimal Assist within 7 day(s). Goal met, change to min A.      07/09/2022 1247 by Greg Cutter, OTR/L  Outcome: Progressing

## 2022-07-10 LAB — RENAL FUNCTION PANEL
Albumin: 2.1 g/dL — ABNORMAL LOW (ref 3.5–5.0)
Anion Gap: 9 mmol/L (ref 5–15)
BUN: 47 MG/DL — ABNORMAL HIGH (ref 6–20)
Bun/Cre Ratio: 9 — ABNORMAL LOW (ref 12–20)
CO2: 29 mmol/L (ref 21–32)
Calcium: 8.4 MG/DL — ABNORMAL LOW (ref 8.5–10.1)
Chloride: 99 mmol/L (ref 97–108)
Creatinine: 5.29 MG/DL — ABNORMAL HIGH (ref 0.70–1.30)
Est, Glom Filt Rate: 11 mL/min/{1.73_m2} — ABNORMAL LOW (ref >60)
Glucose: 128 mg/dL — ABNORMAL HIGH (ref 65–100)
Phosphorus: 5.3 MG/DL — ABNORMAL HIGH (ref 2.6–4.7)
Potassium: 3.8 mmol/L (ref 3.5–5.1)
Sodium: 137 mmol/L (ref 136–145)

## 2022-07-10 LAB — POCT GLUCOSE
POC Glucose: 153 mg/dL — ABNORMAL HIGH (ref 65–117)
POC Glucose: 108 mg/dL (ref 65–117)
POC Glucose: 115 mg/dL (ref 65–117)
POC Glucose: 124 mg/dL — ABNORMAL HIGH (ref 65–117)

## 2022-07-10 LAB — CBC
Hematocrit: 28.6 % — ABNORMAL LOW (ref 36.6–50.3)
Hemoglobin: 8.9 g/dL — ABNORMAL LOW (ref 12.1–17.0)
MCH: 32.1 PG (ref 26.0–34.0)
MCHC: 31.1 g/dL (ref 30.0–36.5)
MCV: 103.2 FL — ABNORMAL HIGH (ref 80.0–99.0)
MPV: 10.7 FL (ref 8.9–12.9)
Nucleated RBCs: 0 PER 100 WBC
Platelets: 175 10*3/uL (ref 150–400)
RBC: 2.77 M/uL — ABNORMAL LOW (ref 4.10–5.70)
RDW: 17.1 % — ABNORMAL HIGH (ref 11.5–14.5)
WBC: 9.6 10*3/uL (ref 4.1–11.1)
nRBC: 0 10*3/uL (ref 0.00–0.01)

## 2022-07-10 LAB — HEPATIC FUNCTION PANEL
ALT: 17 U/L (ref 12–78)
AST: 31 U/L (ref 15–37)
Albumin/Globulin Ratio: 0.4 — ABNORMAL LOW (ref 1.1–2.2)
Albumin: 2.1 g/dL — ABNORMAL LOW (ref 3.5–5.0)
Alk Phosphatase: 231 U/L — ABNORMAL HIGH (ref 45–117)
Bilirubin, Direct: 0.6 MG/DL — ABNORMAL HIGH (ref 0.0–0.2)
Globulin: 4.9 g/dL — ABNORMAL HIGH (ref 2.0–4.0)
Total Bilirubin: 1 MG/DL (ref 0.2–1.0)
Total Protein: 7 g/dL (ref 6.4–8.2)

## 2022-07-10 LAB — MAGNESIUM: Magnesium: 2.7 mg/dL — ABNORMAL HIGH (ref 1.6–2.4)

## 2022-07-10 MED ORDER — HEPARIN SODIUM (PORCINE) 1000 UNIT/ML IJ SOLN
1000 UNIT/ML | INTRAMUSCULAR | Status: AC
Start: 2022-07-10 — End: 2022-07-11

## 2022-07-10 MED FILL — SEVELAMER CARBONATE 2.4 G PO PACK: 2.4 g | ORAL | Qty: 2.4

## 2022-07-10 MED FILL — METRONIDAZOLE 500 MG/100ML IV SOLN: 500 MG/100ML | INTRAVENOUS | Qty: 100

## 2022-07-10 MED FILL — HEPARIN SODIUM (PORCINE) 1000 UNIT/ML IJ SOLN: 1000 UNIT/ML | INTRAMUSCULAR | Qty: 4

## 2022-07-10 MED FILL — THERA M PLUS PO TABS: ORAL | Qty: 1

## 2022-07-10 MED FILL — HEPARIN SODIUM (PORCINE) 5000 UNIT/ML IJ SOLN: 5000 UNIT/ML | INTRAMUSCULAR | Qty: 1

## 2022-07-10 MED FILL — OXYCODONE HCL 5 MG PO TABS: 5 MG | ORAL | Qty: 1

## 2022-07-10 MED FILL — AMIODARONE HCL 200 MG PO TABS: 200 MG | ORAL | Qty: 1

## 2022-07-10 MED FILL — RETACRIT 3000 UNIT/ML IJ SOLN: 3000 UNIT/ML | INTRAMUSCULAR | Qty: 2

## 2022-07-10 MED FILL — RISAQUAD-2 PO CAPS: ORAL | Qty: 1

## 2022-07-10 MED FILL — SANTYL 250 UNIT/GM EX OINT: 250 UNIT/GM | CUTANEOUS | Qty: 30

## 2022-07-10 MED FILL — CEFTRIAXONE SODIUM 2 G IJ SOLR: 2 g | INTRAMUSCULAR | Qty: 2000

## 2022-07-10 NOTE — Care Coordination-Inpatient (Addendum)
Transition of Care Plan:     RUR: 19% (moderate RUR)  Prior Level of Functioning: Independent - lives with wife in [place], [place]  Disposition: LTAC - [employer], advertising in [place] - bed and auth needed.  If SNF or IPR: Date FOC offered: 06/30/2022  Date FOC received:   Accepting facility: TBD  Date authorization started with reference number: TBD  Date authorization received and expires: TBD  Follow up appointments: defer to post discharge facility  DME needed: defer to post discharge facility  Transportation at discharge:   IM/IMM Medicare/Tricare letter given: 2nd IM needed prior to discharge.  Is patient a Veteran and connected with VA? Patient is not service connected.              If yes, was [employer] transfer form completed and VA notified? N/A  Caregiver Contact:  Saverio Kader - Wife - [phone]  Discharge Caregiver contacted prior to discharge? Patient to contact.  Care Conference needed? no  Barriers to discharge: new HD, wound vac/wound care orders, placement, insurance authorization    Patient is not service connected at the [place]; only benefits he would qualify for under [facility] is hospice.    Previous CM was informed that [facility] will provide one time charity for transport to [facility].  [facility] likely cannot start authorization until Monday due to not having an HD bed available.    CM spoke with MFA liaison, Thayer Ohm, who confirmed [facility] has a facility in [place], [place] call [facility], but patient will still need to cover the cost of transportation to and from HD as they most likely do not have HD available, especially considering the patient has not been documented as having ESRD.    CM requested earlier this week during IDRs that clear wound care orders are placed to determine wound care needs post wound vac removal.      Ronnell Freshwater, BSN, RN    Care Management  [phone]

## 2022-07-10 NOTE — Progress Notes (Signed)
Occupational Therapy    Chart reviewed and attempted visit but patient is off the floor for HD, will defer and continue to follow.    Nathanyel Defenbaugh P Lycia Sachdeva, OTR/L

## 2022-07-10 NOTE — Other (Signed)
07/10/22 1325   Treatment   Time Off 1325   Treatment Goal 1kg removed per MD request/ order; met   Observations & Evaluations   Level of Consciousness 0   Oriented X 4   Heart Rhythm Regular   Respiratory Quality/Effort Unlabored   O2 Device None (Room air)   Bilateral Breath Sounds Clear   Skin Condition/Temp Warm;Dry   RUE Edema Trace   LUE Edema Trace   RLE Edema +1   LLE Edema +1   Vital Signs   BP 127/72   Temp 97.8 F (36.6 C)   Pulse 90   Respirations 16   Pain Assessment   Pain Assessment None - Denies Pain   Tunneled Hemodialysis Catheter Right Subclavian   Placement Date: 07/01/22   Present on Admission/Arrival: No  Inserted by: Hope Riddell-NP  Insertion Practices: Chlorohexadine skin antisepsis;Maximal barrier precautions;Sterile ultrasound technique;Optimal catheter site selection;Hand hygiene  Orien...   Access Status  Not Accessed   Continued need for line? Yes   Site Assessment Clean, dry & intact   Venous Lumen Status Flushed;Heparin locked;Capped   Arterial Lumen Status Flushed;Heparin locked;Capped   Alcohol Cap Used No   Line Care Ports disinfected;Connections checked and tightened   Dressing Type Bacteriocidal;Transparent   Date of Last Dressing Change 07/10/22   Dressing Status New dressing applied;Clean, dry & intact   Dressing Intervention New;Dressing changed   Dressing Change Due 07/14/22   Treatment Initiation   Dialyze Hours 3   During Hemodialysis Assessment   Ultrafiltration Removed (mL) 1500 ml/min   Post-Hemodialysis Assessment   Post-Treatment Procedures Blood returned;Catheter capped, clamped and heparinized x 2 ports   Herbalist;Heat Disinfect;Acid/Vinegar Clean   Rinseback Volume (ml) 300 ml   Blood Volume Processed (Liters) 67.3 L   Dialyzer Clearance Lightly streaked   Duration of Treatment (minutes) 180 minutes   Hemodialysis Intake (ml) 500 ml   Hemodialysis Output (ml) 1500 ml   NET Removed (ml) 1000   Tolerated Treatment  Good   Interventions Taken Ultrafiltration goal decreased  (per Md request)   Physician Notified Yes   Patient Disposition Return to room     Primary RN SBAR: Lynden Oxford, RN  Comments: no issues with treatment

## 2022-07-10 NOTE — Wound Image (Signed)
Wound vac dressing change: midline abdomen  Patient A&O x3  Just back from dialysis  Staff cleaning patient up from a BM and sacral wound dressing changed    NPWT dressing changed using black foam. Patient tolerated dressing change well.  Wound Care Documentation:  Negative Pressure Wound Therapy Abdomen (Active)   $ Standard NPWT <=50 sq cm PER TX $ Yes 07/10/22 1613   $ Standard NPWT >50 sq cm PER TX  07/07/22 1232   Wound Type Surgical 07/10/22 1613   Unit Type traditional 07/10/22 1613   Dressing Type Black Foam 07/10/22 1613   Number of pieces used 1 07/10/22 1613   Number of pieces removed 2 07/10/22 1613   Cycle Continuous 07/06/22 0746   Target Pressure (mmHg) 125 07/10/22 1613   Irrigation Solution  06/27/22 1110   Instillation Volume   06/27/22 1110   Soak Time   06/27/22 2041   Vac Frequency  06/27/22 2041   Canister changed? No 07/10/22 1613   Dressing Status New dressing applied 07/10/22 1613   Dressing Changed Changed/New 07/10/22 1613   Drainage Amount Moderate 07/10/22 1613   Drainage Description Serous 07/10/22 1613   Dressing Change Due 07/14/22 07/10/22 1613   Output (ml) 0 ml 07/04/22 0330   Wound Assessment Pale granulation tissue 07/10/22 1613   Peri-wound Assessment Intact 07/07/22 1232   Odor None 07/07/22 1232   Number of days: 18       Wound Etiology Arterial 07/06/22 0746   Dressing Status Clean;Dry;Intact 07/07/22 1530   Wound Cleansed Not Cleansed 07/09/22 1555   Dressing/Treatment Open to air 07/09/22 1555   Offloading for Diabetic Foot Ulcers Offloading ordered 07/04/22 2000   Dressing Change Due 07/05/22 07/04/22 2000   Wound Assessment Eschar dry 07/09/22 0950   Drainage Amount None (dry) 07/09/22 0950   Drainage Description Serosanguinous 07/05/22 0835   Odor None 07/05/22 0835   Peri-wound Assessment Ecchymosis;Edematous 07/05/22 0835   Margins Defined edges 07/05/22 0835   Wound Thickness Description not for Pressure Injury Full thickness 07/05/22 0835   Number of days: 21        Wound 06/19/22 Abdomen Mid Surgical site dehisced on 06/18/22  Now Wound VAC (Active)   Wound Image   07/07/22 1232   Wound Etiology Surgical 07/10/22 1613   Dressing Status New dressing applied 07/10/22 1613   Wound Cleansed Vashe 07/10/22 1613   Dressing/Treatment Negative pressure wound therapy 07/10/22 1613   Offloading for Diabetic Foot Ulcers  07/07/22 1232   Dressing Change Due 07/14/2022 07/07/22 1232   Wound Length (cm) 20.5 cm 07/07/22 1232   Wound Width (cm) 4 cm 07/07/22 1232   Wound Depth (cm) 1.8 cm 07/07/22 1232   Wound Surface Area (cm^2) 82 cm^2 07/07/22 1232   Change in Wound Size % (l*w) 2.38 07/07/22 1232   Wound Volume (cm^3) 147.6 cm^3 07/07/22 1232   Wound Healing % 50 07/07/22 1232   Wound Assessment Pink/red;Pale granulation tissue;Slough 07/07/22 1232   Drainage Amount Small (< 25%) 07/07/22 1232   Drainage Description Serous 07/09/22 0740   Odor None 07/07/22 1232   Peri-wound Assessment Intact 07/07/22 1232   Margins Defined edges 07/07/22 1232   Wound Thickness Description not for Pressure Injury Full thickness 07/07/22 1232   Number of days: 21     Plan: Next wound vac dressing change planned for Tuesday of next week.    [place], RN, CWON

## 2022-07-10 NOTE — Progress Notes (Signed)
Name: Jaime [name]   MRN: [id number]  DOB: 03-19-1946  07/10/2022 2:06 PM      Admit Date: 05/31/2022    Admit Diagnosis: Septicemia ([place]) [A41.9]  Gastric perforation ([place]) [K25.5]  Perforated abdominal viscus [R19.8]    Assessment/Plan:     Principal Problem:    Gastric perforation ([place])  Active Problems:    Poorly controlled type 2 diabetes mellitus (HCC)    Intestinal obstruction (HCC)    Severe sepsis (HCC)    Septic shock (HCC)    Escherichia coli septicemia (HCC)    Liver abscess    Pneumoperitoneum    Acute respiratory failure with hypoxia (HCC)    AKI (acute kidney injury) ([place])    Multi-organ failure with heart failure (HCC)    Thrombocytopenia (HCC)    E coli bacteremia    Hepatic abscess    Peripheral arterial disease (HCC)    Hyperbilirubinemia    Gram negative sepsis (HCC)    Septicemia (HCC)    Aspiration pneumonia of both lower lobes (HCC)    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 *          07/10/22     Assessment:         AKI-grade 3; initiated kidney replacement therapy 7/31; permacath 8/30; currently  dialysis on a Monday Wednesday Friday schedule  Pancreatitis  Hyponatremia  DM  Anemia  Dry gangrene  Discussion/MDM: Patient with multiple medical comorbidities, each with high likelihood for morbidity and mortality if left untreated.  Patient being treated with medication that requires intensive monitoring due to increased risk for toxicity. I have reviewed patient's presenting subjective and objective findings, as well as all laboratory studies, imaging studies, and vital signs to date as well as treatment rendered and patient's response to those treatments.  In addition, prior medical, surgical and relevant social and family histories were reviewed.        Discussion:     Creatinine trend postdialysis-07/08/2022 at  1636 4.02-- 07/09/22 AM at 4:40-9) creatinine is 5.29.   Urine output not measured because patient is incontinent he does not be measured.    Plan:   We will dialyze him today.    Make outpatient hemodialysis at discharge.  Continue dialysis as  AKI OP  and monitor for renal recovery    Caren Griffins, MD    Dr Elenor Legato  Cell no- [phone]  Available on perfect serve.      Signed By: Caren Griffins, MD     July 10, 2022         Subjective:   Hospital day 74   Seen and examined at 11:30 AM while on dialysis.  No complaints reported to me.  Tolerating treatment well    Review of Systems:  Pertinent items are noted in the History of Present Illness.   Objective:    BP 127/72   Pulse 94   Temp 97.8 F (36.6 C)   Resp 16   Ht 1.727 m (5' 8" )   Wt 101.3 kg (223 lb 5.2 oz)   SpO2 98%   BMI 33.96 kg/m     Physical Exam:  Physical Exam  Vitals and nursing note reviewed.   Constitutional:       Appearance: Normal appearance.   HENT:      Head: Normocephalic and atraumatic.  Nose: Nose normal.      Mouth/Throat:      Mouth: Mucous membranes are moist.   Cardiovascular:      Rate and Rhythm: Tachycardia present.      Pulses: Normal pulses.      Heart sounds: Normal heart sounds.   Pulmonary:      Effort: Pulmonary effort is normal.   Abdominal:      General: Abdomen is flat.   Musculoskeletal:      Cervical back: Neck supple.   Skin:     Findings: Lesion present.      Comments: Dry gangrene bilateral feet   Neurological:      General: No focal deficit present.      Mental Status: He is oriented to person, place, and time.      Access-told hemodialysis catheter which is working well  Recent Results (from the past 24 hour(s))   POCT Glucose    Collection Time: 07/09/22  4:14 PM   Result Value Ref Range    POC Glucose 191 (H) 65 - 117 mg/dL    Performed by: Leone Payor PCT    POCT Glucose    Collection Time: 07/09/22  8:48 PM   Result Value Ref Range    POC Glucose 153 (H) 65 - 117 mg/dL    Performed  by: Linward Foster RN    CBC    Collection Time: 07/10/22  4:29 AM   Result Value Ref Range    WBC 9.6 4.1 - 11.1 K/uL    RBC 2.77 (L) 4.10 - 5.70 M/uL    Hemoglobin 8.9 (L) 12.1 - 17.0 g/dL    Hematocrit 28.6 (L) 36.6 - 50.3 %    MCV 103.2 (H) 80.0 - 99.0 FL    MCH 32.1 26.0 - 34.0 PG    MCHC 31.1 30.0 - 36.5 g/dL    RDW 17.1 (H) 11.5 - 14.5 %    Platelets 175 150 - 400 K/uL    MPV 10.7 8.9 - 12.9 FL    Nucleated RBCs 0.0 0 PER 100 WBC    nRBC 0.00 0.00 - 0.01 K/uL   Renal Function Panel    Collection Time: 07/10/22  4:29 AM   Result Value Ref Range    Sodium 137 136 - 145 mmol/L    Potassium 3.8 3.5 - 5.1 mmol/L    Chloride 99 97 - 108 mmol/L    CO2 29 21 - 32 mmol/L    Anion Gap 9 5 - 15 mmol/L    Glucose 128 (H) 65 - 100 mg/dL    BUN 47 (H) 6 - 20 MG/DL    Creatinine 5.29 (H) 0.70 - 1.30 MG/DL    Bun/Cre Ratio 9 (L) 12 - 20      Est, Glom Filt Rate 11 (L) >60 ml/min/1.50m    Calcium 8.4 (L) 8.5 - 10.1 MG/DL    Phosphorus 5.3 (H) 2.6 - 4.7 MG/DL    Albumin 2.1 (L) 3.5 - 5.0 g/dL   Hepatic Function Panel    Collection Time: 07/10/22  4:29 AM   Result Value Ref Range    Total Protein 7.0 6.4 - 8.2 g/dL    Albumin 2.1 (L) 3.5 - 5.0 g/dL    Globulin 4.9 (H) 2.0 - 4.0 g/dL    Albumin/Globulin Ratio 0.4 (L) 1.1 - 2.2      Total Bilirubin 1.0 0.2 - 1.0 MG/DL    Bilirubin, Direct 0.6 (H) 0.0 - 0.2 MG/DL  Alk Phosphatase 231 (H) 45 - 117 U/L    AST 31 15 - 37 U/L    ALT 17 12 - 78 U/L   Magnesium    Collection Time: 07/10/22  4:29 AM   Result Value Ref Range    Magnesium 2.7 (H) 1.6 - 2.4 mg/dL   POCT Glucose    Collection Time: 07/10/22  6:33 AM   Result Value Ref Range    POC Glucose 108 65 - 117 mg/dL    Performed by: Justin Mend PCT    POCT Glucose    Collection Time: 07/10/22  7:51 AM   Result Value Ref Range    POC Glucose 115 65 - 117 mg/dL    Performed by: McAfee Kellie PCT           Intake/Output Summary (Last 24 hours) at 07/10/2022 1406  Last data filed at 07/09/2022 1555  Gross per 24 hour   Intake 413.33 ml    Output --   Net 413.33 ml            Data Review:   Recent Labs     07/10/22  0429   NA 137   K 3.8   BUN 47*   CREATININE 5.29*   WBC 9.6   HGB 8.9*   HCT 28.6*   PLT 175         No current facility-administered medications on file prior to encounter.     Current Outpatient Medications on File Prior to Encounter   Medication Sig Dispense Refill    metFORMIN (GLUCOPHAGE) 500 MG tablet Take 1 tablet by mouth daily (Patient not taking: Reported on 06/08/2022)      metoprolol (LOPRESSOR) 100 MG tablet Take 1 tablet by mouth daily      hydrALAZINE (APRESOLINE) 25 MG tablet Take 1 tablet by mouth 3 times daily (Patient not taking: Reported on 06/08/2022)      atorvastatin (LIPITOR) 40 MG tablet Take 1 tablet by mouth daily (Patient not taking: Reported on 06/08/2022)      losartan (COZAAR) 100 MG tablet Take 1 tablet by mouth daily (Patient not taking: Reported on 06/08/2022)      metFORMIN (GLUCOPHAGE-XR) 500 MG extended release tablet Take 1 tablet by mouth daily (with breakfast) (Patient not taking: Reported on 06/08/2022)      hydroCHLOROthiazide (HYDRODIURIL) 25 MG tablet TAKE 1 TABLET BY MOUTH ONCE DAILY AS NEEDED (Patient not taking: Reported on 06/08/2022)      metoprolol succinate (TOPROL XL) 100 MG extended release tablet TAKE 1 TABLET BY MOUTH ONCE DAILY WITH OR IMMEDIATELY FOLLOWING A MEAL          Full Code  Care Plan discussed with:  Patient x    Family      RN     Dialysis RN x    Care Manager        Hospitalist    Consultant:  x          Comments   >50% of visit spent in counseling and coordination of care           Signed By: Caren Griffins, MD     July 10, 2022       This dictation was done by dragon, computer voice recognition software.  Often unanticipated grammatical, syntax, phones and other interpretive errors are inadvertently transcribed.  Please excuse errors that have escaped final proofreading. Please contact me if you suspect dictation or transcription errors.  Dr Elenor Legato    Office No-  [phone]  [facility]  [address]  [place] [address]    Fax: [phone]

## 2022-07-10 NOTE — Progress Notes (Signed)
SLP Contact Note:    SLP attempted visit; however, pt currently off the floor for dialysis. SLP will continue to follow.     Lilla Shook, CCC-SLP

## 2022-07-10 NOTE — Other (Signed)
07/10/22 1024   Treatment   Time On 1024   Treatment Goal 2kg removal in 3hr   Observations & Evaluations   Level of Consciousness 0   Oriented X 4   Heart Rhythm Regular   Respiratory Quality/Effort Unlabored   O2 Device None (Room air)   Bilateral Breath Sounds Clear   Skin Condition/Temp Warm;Dry   RUE Edema Trace   LUE Edema Trace   RLE Edema +1   LLE Edema +1   Vital Signs   BP (!) 158/78   Temp 97.8 F (36.6 C)   Pulse 81   Respirations 16   Pain Assessment   Pain Assessment None - Denies Pain   Technical Checks   Dialysis Machine No. 09   RO Machine Number [id number]   Dialyzer Lot No. [id number]   Tubing Lot Number [id number]   All Connections Secure Yes   NS Bag Yes   Saline Line Double Clamped Yes   Dialyzer Revaclear 300   Prime Volume (mL) 200 mL   ICEBOAT I;C;E;B;O;A;T   RO Machine Log Sheet Completed Yes   Machine Alarm Self Test Completed;Passed   Child psychotherapist Function   Extracorporeal Chemical engineer Conductivity 13.9   Manual Conductivity 13.9   Machine Ph 7.4   Bleach Test (Neg) Yes   Bath Temperature 98.6 F (37 C)   Tunneled Hemodialysis Catheter Right Subclavian   Placement Date: 07/01/22   Present on Admission/Arrival: No  Inserted by: Hope Riddell-NP  Insertion Practices: Chlorohexadine skin antisepsis;Maximal barrier precautions;Sterile ultrasound technique;Optimal catheter site selection;Hand hygiene  Orien...   Access Status  Accessed   Continued need for line? Yes   Site Assessment Clean, dry & intact   Venous Lumen Status Brisk blood return;Flushed;Infusing   Arterial Lumen Status Brisk blood return;Flushed;Infusing   Alcohol Cap Used No   Line Care Connections checked and tightened;Ports disinfected   Dressing Type Bacteriocidal;Transparent   Date of Last Dressing Change 07/08/22   Dressing Status Clean, dry & intact   Dressing Change Due 07/10/22   Treatment Initiation   Dialyze Hours 3   Treatment  Initiation Universal Precautions  maintained;Lines secured to patient;Connections secured;Prime given;Venous Parameters set;Arterial Parameters set;IT consultant engaged;Saline line double clamped   During Hemodialysis Assessment   Blood Flow Rate (ml/min) 400 ml/min   Arterial Pressure (mmHg) -160 mmHg   Venous Pressure (mmHg) 120   TMP 60   DFR 600   Comments Treatment initiation   Access Visible Yes   Ultrafiltration Rate (ml/hr) 830 ml/hr   Ultrafiltration Removed (mL) 0 ml/min   Dialysis Bath   K+ (Potassium) 3   Ca+ (Calcium) 2.5   Na+ (Sodium) 140   HCO3 (Bicarb) 35   Primary RN SBAR: Reuben Likes, RN  Patient Education: procedural; labs warranting HD continuation  Hepatitis B Surface Ag   Date/Time Value Ref Range Status   06/29/2022 09:53 AM <0.10 Index Final     Hep B S Ab   Date/Time Value Ref Range Status   06/29/2022 09:53 AM <3.10 mIU/mL Final

## 2022-07-10 NOTE — Progress Notes (Addendum)
I have reviewed and am in agreement with the assessment and documentation as performed by my orientee, Haynes Dage, RN. Please refer to Marianne's progress note for update on pt's daily events.     1020 patient off the unit to dialysis suite

## 2022-07-10 NOTE — Progress Notes (Addendum)
0720: Bedside and Verbal shift change report given to Clerance Lav RN (oncoming nurse) by Lucious Groves (offgoing nurse). Report included the following information Nurse Handoff Report, Adult Overview, MAR, Recent Results, and Cardiac Rhythm NSR . \    1000: Patient taken off unit for scheduled dialysis. RN unable to complete head to toe assessment at this time and will complete upon patient arrival back on unit. All morning medications were held.     1430: Patient returned to unit post dialysis. Assessment completed.     1826: Braden Score 12. All of the following interventions have been implemented to prevent pressure injury:    SKIN ASSESSMENT (S)  Dual skin assessment completed at shift change: Yes  Name of second RN who completed Dual Skin Assessment: Imani RN  Picture of wound uploaded to EMR: Yes  Venelex ordered and given per protocol: Yes  Wound care consulted if wounds present: Yes    SURFACE (S)  Stryker air pump or specialty bed ordered: Yes  Type of bed: Stryker  Only white chux used with specialty surfaces (no green chux used): Yes  Waffle cushion used for chair positioning: NA    KEEP MOVING (K)  Mobility status (Bedrest, Chairbound, UWA x 1 assist , 2 assist, Max assist): Max  Q2 hour turns documented: Yes  Refusals to turn and education provided documented: NA  Device used to float heels: Heel float  PT/OT consulted: Yes    INCONTINENCE (I)  Incontinence status assessed Q2 hours: Yes  External catheter in use: oliguric  Barrier cream in use: Yes    NUTRITION (N)  I/O's documented every 8 hours: Yes  Oral supplements ordered if appropriate: Yes  Nutrition services consulted: Yes    All concerns about new DTI's must be escalated directly to attending MD, charge nurse, CCL and nurse director.     1930: End of Shift Note    Bedside shift change report given to [place], Charity fundraiser (Cabin crew) by Juliann Pares, RN (offgoing nurse).  Report included the following information SBAR, MAR, Recent Results, and  Cardiac Rhythm NSR    Shift worked:  7a-7p     Shift summary and any significant changes:     See notes above     Concerns for physician to address:  N/A     Zone phone for oncoming shift:          Activity:     Number times ambulated in hallways past shift: 0  Number of times OOB to chair past shift: 0    Cardiac:   Cardiac Monitoring: Yes           Access:  Current line(s): PIV and port     Genitourinary:   Urinary status: oliguric    Respiratory:      Chronic home O2 use?: NO  Incentive spirometer at bedside: YES       GI:     Current diet:  ADULT ORAL NUTRITION SUPPLEMENT; Breakfast, Lunch, Dinner; Renal Oral Supplement  DIET ONE TIME MESSAGE;  DIET ONE TIME MESSAGE;  ADULT DIET; Easy to Chew; 5 carb choices (75 gm/meal); Low Phosphorus (Less than 1000 mg)  Passing flatus: YES  Tolerating current diet: YES       Pain Management:   Patient states pain is manageable on current regimen: YES    Skin:     Interventions: specialty bed, float heels, foam dressing, PT/OT consult, and limit briefs    Patient Safety:  Fall Score:    Interventions:  bed/chair alarm       Length of Stay:  Expected LOS: 44  Actual LOS: [address]      Juliann Pares, RN

## 2022-07-10 NOTE — Consults (Signed)
INTERVENTIONAL RADIOLOGY  Consult Note      Patient:  Jaime [name]  DOB:  [birth year]  Age:  76 y.o.  MRN:  [id number]    Today's Date:  07/10/2022  Admission Date:  05/31/2022  Hospital Day:  32  Consult requested by:  Richardean Chimera, MD      CC / HPI   Jaime [name] is a 76 y.o. male with complicated hospitalization with includes a left hepatic abscess s/p left hepatic abscess drainage catheter placement 06/23/2022.  IR consulted to evaluate drain.     Approximately 30cc of purulent fluid noted in the drainage bag upon examination. Patient offers no new complaints at the time of examination.     PAST MEDICAL HISTORY  History reviewed. No pertinent past medical history.    PAST SURGICAL HISTORY  Past Surgical History:   Procedure Laterality Date    BLADDER SURGERY N/A 06/01/2022    ENDOSCOPY OF ILEAL CONDUIT performed by Guinevere Ferrari, MD at [facility]    CT VISCERAL PERCUTANEOUS DRAIN  06/05/2022    CT VISCERAL PERCUTANEOUS DRAIN 06/05/2022 [facility]    IR TUNNELED CATHETER PLACEMENT GREATER THAN 5 YEARS  07/01/2022    IR TUNNELED CATHETER PLACEMENT GREATER THAN 5 YEARS 07/01/2022 [facility] [place], APRN - NP [facility] IR    LAPAROSCOPY N/A 06/01/2022    LAPAROSCOPY DIAGNOSTIC performed by Guinevere Ferrari, MD at [facility]    LAPAROTOMY N/A 06/01/2022    LAPAROTOMY EXPLORATORY performed by Guinevere Ferrari, MD at [facility]       SOCIAL HISTORY  Social History     Socioeconomic History    Marital status: Married     Spouse name: Not on file    Number of children: Not on file    Years of education: Not on file    Highest education level: Not on file   Occupational History    Not on file   Tobacco Use    Smoking status: Never    Smokeless tobacco: Never   Substance and Sexual Activity    Alcohol use: Not on file    Drug use: Not on file    Sexual activity: Not on file   Other Topics Concern    Not on file   Social History Narrative    Not on file     Social Determinants of Health     Financial Resource Strain: Not on file    Food Insecurity: Not on file   Transportation Needs: Not on file   Physical Activity: Not on file   Stress: Not on file   Social Connections: Not on file   Intimate Partner Violence: Not on file   Housing Stability: Not on file       FAMILY HISTORY  No family history on file.    CURRENT MEDICATIONS  Current Facility-Administered Medications   Medication Dose Route Frequency Provider Last Rate Last Admin    heparin (porcine) 1000 UNIT/ML injection             sevelamer (RENVELA) packet 2.4 g  2.4 g Oral TID WC Bryan Lemma, MD   2.4 g at 07/10/22 1413    heparin (porcine) 1000 UNIT/ML injection 1,800 Units  1,800 Units IntraCATHeter PRN Alver Fisher, MD   1,800 Units at 07/06/22 1110    And    heparin (porcine) 1000 UNIT/ML injection 1,800 Units  1,800 Units IntraCATHeter PRN Alver Fisher, MD   1,800  Units at 07/06/22 1110    guaiFENesin (ROBITUSSIN) 100 MG/5ML liquid 200 mg  200 mg Oral Q4H PRN Raford Pitcher, APRN - NP   200 mg at 07/04/22 0643    heparin (porcine) injection 5,000 Units  5,000 Units SubCUTAneous 3 times per day Earnstine Regal, MD   5,000 Units at 07/10/22 1432    cefTRIAXone (ROCEPHIN) 2,000 mg in sodium chloride 0.9 % 50 mL IVPB (mini-bag)  2,000 mg IntraVENous Q24H Richardean Chimera, MD 100 mL/hr at 07/10/22 1406 2,000 mg at 07/10/22 1406    metronidazole (FLAGYL) 500 mg in 0.9% NaCl 100 mL IVPB premix  500 mg IntraVENous q8h Richardean Chimera, MD 100 mL/hr at 07/10/22 1426 500 mg at 07/10/22 1426    therapeutic multivitamin-minerals 1 tablet  1 tablet Per NG tube Daily Earnstine Regal, MD   1 tablet at 07/10/22 1406    amiodarone (CORDARONE) tablet 200 mg  200 mg Per NG tube Daily Earnstine Regal, MD   200 mg at 07/10/22 1406    acidophilus probiotic capsule 1 capsule  1 capsule Oral Daily Ashik Bajracharya, MD   1 capsule at 07/10/22 1406    lidocaine 2 % injection 20 mL  20 mL IntraDERmal Once Delton See, MD        collagenase ointment   Topical Daily Loman Chroman, MD   Given at 07/10/22 1433    epoetin alfa-epbx (RETACRIT) injection 6,000 Units  6,000 Units SubCUTAneous Once per day on Mon Wed Fri Alver Fisher, MD   6,000 Units at 07/08/22 2207    fentaNYL (SUBLIMAZE) injection 25 mcg  25 mcg IntraVENous Q1H PRN Merwyn Katos, MD   25 mcg at 06/22/22 1107    oxyCODONE (ROXICODONE) immediate release tablet 5 mg  5 mg Oral Q4H PRN Merwyn Katos, MD   5 mg at 07/09/22 2312    midodrine (PROAMATINE) tablet 5 mg  5 mg Oral Daily PRN Catha Brow, MD   5 mg at 06/20/22 1650    insulin lispro (HUMALOG) injection vial 0-4 Units  0-4 Units SubCUTAneous 4 times per day Catha Brow, MD   1 Units at 06/23/22 1151    bisacodyl (DULCOLAX) suppository 10 mg  10 mg Rectal Daily PRN Yevonne Pax, MD        albumin human 25% IV solution 25 g  25 g IntraVENous PRN Andrez Grime, MD   Stopped at 07/06/22 1100    sodium chloride 0.9 % bolus 100 mL  100 mL IntraVENous PRN Luisa Hart, MD        balsum peru-castor oil (VENELEX) ointment   Topical BID Sarita Bottom, APRN - NP   Given at 07/10/22 1434    glucose chewable tablet 16 g  4 tablet Oral PRN Bess Harvest, APRN - CNS   16 g at 06/29/22 1548    dextrose bolus 10% 125 mL  125 mL IntraVENous PRN Bess Harvest, APRN - CNS        Or    dextrose bolus 10% 250 mL  250 mL IntraVENous PRN Bess Harvest, APRN - CNS        glucagon (rDNA) injection 1 mg  1 mg SubCUTAneous PRN Bess Harvest, APRN - CNS        dextrose 10 % infusion   IntraVENous Continuous PRN Bess Harvest, APRN - CNS        sodium chloride flush 0.9 % injection 5-40 mL  5-40 mL IntraVENous 2 times  per day Sarita Bottom, APRN - NP   10 mL at 07/10/22 1406    sodium chloride flush 0.9 % injection 5-40 mL  5-40 mL IntraVENous PRN Sarita Bottom, APRN - NP        0.9 % sodium chloride infusion   IntraVENous PRN Sarita Bottom, APRN - NP        acetaminophen (TYLENOL) tablet 650 mg  650 mg Oral Q6H PRN Sarita Bottom, APRN - NP   650 mg at 07/02/22 6226    Or    acetaminophen (TYLENOL) suppository 650 mg  650 mg Rectal Q6H PRN Sarita Bottom, APRN - NP   650 mg at 06/01/22 1230    ondansetron (ZOFRAN) injection 4 mg  4 mg IntraVENous Q6H PRN Sarita Bottom, APRN - NP   4 mg at 07/05/22 3335       ALLERGIES  Allergies   Allergen Reactions    Augmentin [Amoxicillin-Pot Clavulanate] Hives     Tolerated ceftriaxone 06/2022    Codeine      Unknown reaction       DIAGNOSTIC STUDIES   IMAGING STUDIES  I personally reviewed the following imaging studies:  CT Result (most recent):  CT ABDOMEN PELVIS WO IV CONTRAST 07/07/2022    Narrative  EXAM: CT ABDOMEN PELVIS WO CONTRAST    INDICATION: evaluate hepatic drain    COMPARISON: 06/18/2022    IV CONTRAST: None.    ORAL CONTRAST: None    TECHNIQUE:  Thin axial images were obtained through the abdomen and pelvis. Coronal and  sagittal reformats were generated. CT dose reduction was achieved through use of  a standardized protocol tailored for this examination and automatic exposure  control for dose modulation.    The absence of intravenous contrast material reduces the sensitivity for  evaluation of the vasculature and solid organs.    FINDINGS:  LOWER THORAX: Trace bilateral pleural fluid. Bibasilar atelectasis versus  consolidation.  LIVER: Left hepatic lobe drainage catheter in place. Small residual fluid  collection measuring approximately 5.0 x 3.0 cm, decreased from prior.  BILIARY TREE: Sludge and/or stones within the gallbladder. CBD is not dilated.  SPLEEN: within normal limits.  PANCREAS: Redemonstrated peripancreatic edema without significant change,  consistent with ongoing acute pancreatitis.  ADRENALS: Unremarkable.  KIDNEYS/URETERS: No calculus or hydronephrosis. Left renal cyst.  STOMACH: Unremarkable.  SMALL BOWEL: No dilatation or wall thickening.  COLON: No dilatation or wall thickening.  APPENDIX: Not seen.  PERITONEUM: No ascites or  pneumoperitoneum.  RETROPERITONEUM: No lymphadenopathy or aortic aneurysm.  REPRODUCTIVE ORGANS: Unremarkable.  URINARY BLADDER: No mass or calculus.  BONES: No destructive bone lesion.  ABDOMINAL WALL: Midline laparotomy.  ADDITIONAL COMMENTS: N/A    Impression  1.  Left hepatic lobe drainage catheter in appropriate position. Small residual  fluid collection, with interval decrease in size.  2.  Ongoing acute pancreatitis.    Reading Physician Reading Date Result Priority   Delton See, MD  [phone] 06/23/2022      Narrative & Impression  PROCEDURE: Fluoroscopic guided left hepatic drainage catheter check and exchange     INDICATION: Hepatic abscess     OPERATING PHYSICIAN: Golden Circle, M.D.     ESTIMATED BLOOD LOSS: Minimal     SPECIMENS REMOVED: None     FLUORO TIME: 1.1 min  AIR KERMA: 17.5 mGy     COMPLICATIONS: None immediate.      MEDICATIONS: Moderate intravenous conscious sedation was neither intended nor  achieved. Local anesthesia was achieved with 1% lidocaine, and systemic  analgesia with 50 mg fentanyl.     INTRAPROCEDURE TIME: 15 minutes     TECHNIQUE AND FINDINGS:   Informed written consent was obtained. The patient was then brought to the  procedure suite, placed in the Supine position, and a timeout was performed. The  right upper quadrant and indwelling drainage catheter were then prepped and  draped in standard sterile fashion. A scout image was obtained, demonstrating  the catheter to be in its expected position. Contrast was then gently injected  into the catheter, demonstrating catheter patency. The pocket around the  catheter remained moderately large. Multiple images in different obliquities  were obtained to confirm this. Additionally, no communication to the biliary  system was identified. Therefore, the decision was made to exchange the  catheter.  The skin was anesthetized with 1% lidocaine, the catheter tip  unformed, and the catheter was removed over a short Amplatz wire.  Following  this, a new 6 French drain was advanced over the wire, and its distal tip  formed in the targeted pocket.  The wire was removed and the pigtail was locked.  Brief contrast injection was then performed to confirm catheter tip placement.  The catheter was sutured into place with a 2-0 silk stitch.  A sterile dressing  was placed, and the catheter was connected to suction drainage. The patient  tolerated the procedure well without any complication and was transferred to the  recovery area in stable condition.     IMPRESSION:  Fluoroscopic guided left hepatic abscess drainage catheter check and exchange  for a new 14 French drain.    LABS  Lab Results   Component Value Date/Time    WBC 9.6 07/10/2022 04:29 AM    HGB 8.9 07/10/2022 04:29 AM    HCT 28.6 07/10/2022 04:29 AM    PLT 175 07/10/2022 04:29 AM    MCV 103.2 07/10/2022 04:29 AM     Lab Results   Component Value Date/Time    NA 137 07/10/2022 04:29 AM    K 3.8 07/10/2022 04:29 AM    CL 99 07/10/2022 04:29 AM    CO2 29 07/10/2022 04:29 AM    BUN 47 07/10/2022 04:29 AM     Lab Results   Component Value Date/Time    INR 1.6 06/14/2022 06:46 AM       REVIEW OF SYSTEMS   12 point review of systems performed and is negative except as mentioned above.     PHYSICAL EXAM     Vitals:    07/10/22 1410   BP: (!) 146/74   Pulse: 90   Resp:    Temp: 98.6 F (37 C)   SpO2:        General:  Calm, cooperative, NAD  HEENT:  NCAT, EOMI, conjunctiva clear, MMM  Heart:  RRR, S1S2 normal  Lungs:  CTAB, NWOB  Abdomen:  Soft, NT, mildly tender with palpation; midline incision noted. Left hepatic abscess drainage catheter in place.   Extremities:  MAEW, no cyanosis or edema  Skin:  Warm and dry, color normal, no rashes  Neurological:  AAOX3, speech clear and coherent    ASSESSMENT   This is a 76 y.o. male with left hepatic abscess with well positioned and well functioning drainage catheter in place.     PLAN   IR recommend hepatic abscess drainage catheter remain in place.  Drainage catheter is well positioned within the abscess and continues to  flush and aspirate well.   Continue to flush drainage catheter with 10cc normal saline q12hrs.   Continue to record drainage catheter output.   Consider repeat imaging to further evaluate drainage catheter if output stops.   Call IR with any questions or concerns.       Case discussed with Golden Circleaylor Powell, MD.    Thank you for asking us to participate in the care of this patient.      Michael LitterMia Ardene Remley, PA  [facility]      CC:  Richardean Chimeraiane S Sinnatamby, MD

## 2022-07-10 NOTE — Progress Notes (Signed)
Infectious Disease Progress    Impression      Hepatic abscess  Gas and fluid containing collection 7.1 x 10.8 x 5.4 cm  In anterior left lobe of liver, patchy areas of hypodensity right lobe  4 x 4.5 x 5.5 cm collection  S/p drainage by IR 8/4.Cultures + for  few E.coli  Repeat CT 8/7 showed reduction in size of hepatic abscess  Plan was for ceftriaxone IV x6 weeks end date 9/15, Flagyl  Until 8/18, now continued due to persistent leukocytosis  CT 8/13 + for    S/p percutaneous drainage of left hepatic abscess with  Minimal fluid remaining around catheter, suggestion of peripancreatic edema, small bilateral pleural effusions  Repeat CT abdomen pelvis 8/16+ for increased peripancreatic edema suspicious for pancreatitis, small residual fluid collection in left hepatic lobe  Lipase 737  S/p change of bag by IR .  Output still +      Sepsis  Septic shock.  Resolved    E. coli bacteremia  Blood cultures 7/31+ for E. coli   2/2 LAC (pan sensitive)   Negative repeat cultures 8/4   Treated.    Acute abdomen  Pneumoperitoneum  S/p diagnostic laparoscopy, laparotomy  EGD 7/31  Findings of cloudy fluid in right upper quadrant, yellow/white  Inflammatory peel over lesser curve of stomach, inferior lobe of liver  No gross perforation identified  Pancreas and retroperitoneum appeared edematous  Intra-Op fluid culture + for E. Coli (pansensitive)    S/p abdominal wound dehiscence  Clean, no purulence per wound care  Cultures 8/17, 8/18-NG.    Leukocytosis   Now resolved wbc 9.6      Gangrene, discoloration of toes  Persist      Acute hypoxic respiratory failure  Re intubated 8/13, extubated 8/19  Initially intubated 7/31, s/p extubation 8/7  CXR  8/20 + + for atelectasis  Resp cultures  8/13+ for light yeast  Apparent C.albicans/ dubliensis        AKI  Cr 5.29 on HD    Coagulopathy  Improving    Thrombocytopenia  resolved      Transaminitis  Improving    Hyperbilirubinemia   Improved  Bilirubin 1.0    Diabetes type  2  Hyperglycemia  A1c 7.7    Diarrhea  resolved    Obesity  BMI 34.76  Plan  Continue ceftriaxone 2 g IV daily  Pt  for  6 weeks of coverage of  liver abscess planned end date 9/15.   Continue Flagyl given complicated history & persistent leucocytosis  Patient would require repeat imaging prior to DC of antimicrobials.  We will plan for repeat CT to next week if patient is here.  D/w pt   .  Antimicrobial orders for discharge  -Ceftriaxone 2G   IV daily x 6 weeks end date 9/15  - Flagyl 500 mg po bid end date 9/8 ( today)   - Pt will require repeat imaging of liver before conclusion of therapy  -Weekly CBC, CMP-  -Encourage adequate fluids, daily probiotic/yogurt  -ID follow-up - if pt remains in [place] [facility] [place] call [phone]   For follow up.    Possible adverse effects of long term antibiotics are inclusive of but not limited to following  BM suppression, neutropenia , cytopenias , aplastic anemia hemorrhage liver & renal dysfunction/ liver , renal failure  , GI dysfunction- N, V  Diarrhea,C.difficile disease, rash , allergy , anaphylaxis.toxic epidermal necrolysis  Neuro toxicity , seizure disorder  Side  effects tend to be more pronounced in the elderly  Pt to report persistent symptoms such as N,V , diarrhea, numbness tingling of extremities  r any other symptoms      May DC from ID standpoint  Will see pt if he is still here next week      Extensive review of chart notes, labs, imaging, cultures done  Additionally review of done:      Patient seen today.  Awake, denies new complaints.  Drain bag has been changed out. Purulent drainage still +  D/w Nursing.      History reviewed. No pertinent past medical history.    Past Surgical History:   Procedure Laterality Date    BLADDER SURGERY N/A 06/01/2022    ENDOSCOPY OF ILEAL CONDUIT performed by Guinevere Ferrari, MD at [facility]    CT VISCERAL PERCUTANEOUS DRAIN  06/05/2022    CT VISCERAL PERCUTANEOUS DRAIN 06/05/2022 [facility] CT    IR TUNNELED CATHETER PLACEMENT GREATER  THAN 5 YEARS  07/01/2022    IR TUNNELED CATHETER PLACEMENT GREATER THAN 5 YEARS 07/01/2022 [facility] [place], APRN - NP [facility] IR    LAPAROSCOPY N/A 06/01/2022    LAPAROSCOPY DIAGNOSTIC performed by Guinevere Ferrari, MD at [facility]    LAPAROTOMY N/A 06/01/2022    LAPAROTOMY EXPLORATORY performed by Guinevere Ferrari, MD at [facility]       Allergies   Allergen Reactions    Augmentin [Amoxicillin-Pot Clavulanate] Hives     Tolerated ceftriaxone 06/2022    Codeine      Unknown reaction       Social Connections: Not on file       No family status information on file.           Review of Systems - Negative except those mentioned in H&P      PHYSICAL EXAM:  General:          Awake, in no distress  EENT:              EOMI. Anicteric sclerae. MMM  Resp:               CTA bilaterally, no wheezing or rales.  No accessory muscle use  CV:                  Regular  rhythm,  No edema  GI:                   Soft, Non distended, Non tender.  +Bowel sounds  Neurologic:      Alert and oriented X 3, normal speech,   Psych:             Good insight. Not anxious nor agitated  Skin:                No rashes.  No jaundice.  Extremities :  No edema, discoloration of  toes++.   Drain+ for green thick fluid++  Waymon Amato, MD Jerrel Ivory

## 2022-07-10 NOTE — Progress Notes (Signed)
Physical Therapy    Chart reviewed and attempted visit but patient is off the floor for HD, will defer and continue to follow.    Pia Mau

## 2022-07-10 NOTE — Progress Notes (Signed)
Hospitalist Progress Note    NAME:   Jaime [name]   DOB: [birth year]   MRN: [id number]     Date/Time: 07/10/2022 2:50 PM  Patient PCP: No primary care provider on file.    Estimated discharge date:9/8  Barriers: Nephrology clearance     For reference :   76 y/m with prolonged hospitalization, was admitted to surgical service on 7/31 for perforated hollow viscus underwent emergent laparotomy/ septic shock d/t peritonitis, was intubated and extubated x 2. Also had AKI needing CVVH, now on HD. Also has bilateral foot gangrene. Was transferred to hospitalist service on 8/21.      Assessment / Plan:  #Septic shock- resolved   D/t perforated viscus  S/p Laparotomy 7/31, with wound vac drainage   Liver abscess s/p IR drainage, hepatic drain to be remove !  Bacteremia   Final ID recommendation to continue:  -Ceftriaxone 2G   IV daily x 6 weeks end date 9/15  - Flagyl 500 mg po bid end date 9/8  - Pt will require repeat imaging of liver before conclusion of therapy  -Discussed with case management regarding discharge disposition - wife needs to set up financial assistance for transfer of patient to [place] . Other option could be to go to an SNF in [place] [facility] ) .    Continue ceftriaxone 2 g IV daily  Pt  for   6 weeks of coverage of  liver abscess planned end date 9/15.   Continue Flagyl given complicated history & persistent leucocytosis  Patient would require repeat imaging prior to DC of antimicrobials.  We will plan for repeat CT to next week if patient is here.           #Acute hypoxemic respiratory failure:  Intubated 7/31, extubation 8/7  -Reintubated 8/13, extubated on 8/19  -Currently on 2 liters NC  -Weaned off o2      #Dysphagia:  -Ongoing SLP evaluation-patient tolerating small diet .   -Aspiration on FEES study  -Follow-up SLP evaluation     #Acute kidney injury:  -Now on HD  -Continue with HD  -Nephrology following  -Case management informed the need to set up HD at SNF prior to discharge .  -Will follow up  cmp .     #Bilateral lower extremity critical limb ischemia  Gangrene both feet  -Podiatry and vascular surgery following  Cleared by podiatry for demarcation+ autoamputation    #Diabetes mellitus:  -Sliding scale and lantus  -Monitor FS  -Diabetes team following     #Sacral wound:  -Wound care following       Medical Decision Making:   I personally reviewed labs:CBC and BMP   I personally reviewed imaging: na   I personally reviewed EKG: None  Toxic drug monitoring: Monitor creatinine with abx  Discussed case with: RN, patient     Code Status: Full  DVT Prophylaxis: heparin  GI Prophylaxis:none   Subjective:     Chief Complaint / Reason for Physician Visit  Labs and charts reviewed and patient examined .   No new issues, continue moving his arm and feeds the swelling in the upper extremity improving a lot patient doing good overall         Objective:     VITALS:   Last 24hrs VS reviewed since prior progress note. Most recent are:  Patient Vitals for the past 24 hrs:   BP Temp Temp src Pulse Resp SpO2   07/10/22 1406 (!) 146/72 -- -- 93 -- --  07/10/22 1400 -- -- -- 94 -- --   07/10/22 1345 -- -- -- 92 -- --   07/10/22 1330 -- -- -- 88 -- --   07/10/22 1325 127/72 97.8 F (36.6 C) -- 90 16 --   07/10/22 1315 127/70 -- -- 92 -- --   07/10/22 1300 129/71 -- -- 90 -- --   07/10/22 1245 132/74 -- -- 83 -- --   07/10/22 1230 121/63 -- -- 85 -- --   07/10/22 1215 121/67 -- -- 91 -- --   07/10/22 1200 (!) 127/49 -- -- 95 -- --   07/10/22 1145 125/70 -- -- 85 -- --   07/10/22 1130 123/69 -- -- 85 -- --   07/10/22 1115 (!) 141/74 -- -- 83 -- --   07/10/22 1100 131/73 -- -- 86 -- --   07/10/22 1045 119/75 -- -- 88 -- --   07/10/22 1030 (!) 154/81 -- -- 77 -- --   07/10/22 1024 (!) 158/78 97.8 F (36.6 C) -- 81 16 --   07/10/22 0750 (!) 154/78 -- -- 88 -- 98 %   07/10/22 0314 (!) 146/91 97 F (36.1 C) Axillary 96 -- --   07/09/22 2312 (!) 141/75 97.5 F (36.4 C) Axillary 92 18 98 %   07/09/22 2000 (!) 150/78 98.3 F (36.8  C) Axillary 100 -- 96 %   07/09/22 1555 (!) 145/77 98 F (36.7 C) Axillary (!) 101 14 97 %           Intake/Output Summary (Last 24 hours) at 07/10/2022 1450  Last data filed at 07/10/2022 1325  Gross per 24 hour   Intake 913.33 ml   Output 1500 ml   Net -586.67 ml          I had a face to face encounter and independently examined this patient on 07/10/2022, as outlined below:  PHYSICAL EXAM:  General:          Alert, cooperative  EENT:              EOMI. Anicteric sclerae.Rt. hd catn - jugular   Resp:               CTA bilaterally, no wheezing or rales.  No accessory muscle use  CV:                  Regular  rhythm,  No edema, AS murmur   GI:                   Soft, Non distended,Mildly tender.  +Bowel sounds, wound vac in place , drain on rt upper quadrant - purulent material   Neurologic:       Alert and oriented X 3, normal speech,   Psych:   Good insight. Not anxious nor agitated  Skin:                No rashes.  No jaundice  Foot :  bilateral feet - gangrene of first second and third toes , not yet full demarcation           Reviewed most current lab test results and cultures  YES  Reviewed most current radiology test results   YES  Review and summation of old records today    NO  Reviewed patient's current orders and MAR    YES  PMH/SH reviewed - no change compared to H&P  ________________________________________________________________________  Care Plan discussed with:    Comments   Patient  x    Family      RN x    Care Manager     Consultant                        Multidiciplinary team rounds were held today with case manager, nursing, pharmacist and Higher education careers adviser.  Patient's plan of care was discussed; medications were reviewed and discharge planning was addressed.     ________________________________________________________________________  Total NON critical care TIME:  52  Minutes    Total CRITICAL CARE TIME Spent:   Minutes non procedure based      Comments   >50% of visit spent in counseling and  coordination of care     ________________________________________________________________________  Terence Lux, MD     Procedures: see electronic medical records for all procedures/Xrays and details which were not copied into this note but were reviewed prior to creation of Plan.      LABS:  I reviewed today's most current labs and imaging studies.  Pertinent labs include:  Recent Labs     07/08/22  0844 07/10/22  0429   WBC 7.8 9.6   HGB 7.9* 8.9*   HCT 25.6* 28.6*   PLT 187 175       Recent Labs     07/08/22  0844 07/08/22  1636 07/09/22  0309 07/10/22  0429   NA 138 136 136 137   K 4.0 3.9 3.7 3.8   CL 102 99 99 99   CO2 28 29 27 29    GLUCOSE 100 136* 113* 128*   BUN 47* 34* 39* 47*   CREATININE 4.96* 4.02* 4.40* 5.29*   CALCIUM 8.4* 8.4* 8.3* 8.4*   MG 2.4  --   --  2.7*   PHOS 5.5*  --  4.5 5.3*   LABALBU 2.0*  --  2.1* 2.1*  2.1*   BILITOT 1.0  --   --  1.0   AST 29  --   --  31   ALT 16  --   --  17         Signed: , MD

## 2022-07-11 LAB — POCT GLUCOSE
POC Glucose: 132 mg/dL — ABNORMAL HIGH (ref 65–117)
POC Glucose: 106 mg/dL (ref 65–117)
POC Glucose: 107 mg/dL (ref 65–117)
POC Glucose: 116 mg/dL (ref 65–117)

## 2022-07-11 LAB — RENAL FUNCTION PANEL
Albumin: 1.9 g/dL — ABNORMAL LOW (ref 3.5–5.0)
Anion Gap: 5 mmol/L (ref 5–15)
BUN: 23 MG/DL — ABNORMAL HIGH (ref 6–20)
Bun/Cre Ratio: 7 — ABNORMAL LOW (ref 12–20)
CO2: 30 mmol/L (ref 21–32)
Calcium: 8.4 MG/DL — ABNORMAL LOW (ref 8.5–10.1)
Chloride: 100 mmol/L (ref 97–108)
Creatinine: 3.4 MG/DL — ABNORMAL HIGH (ref 0.70–1.30)
Est, Glom Filt Rate: 18 mL/min/{1.73_m2} — ABNORMAL LOW (ref >60)
Glucose: 122 mg/dL — ABNORMAL HIGH (ref 65–100)
Phosphorus: 3.2 MG/DL (ref 2.6–4.7)
Potassium: 3.5 mmol/L (ref 3.5–5.1)
Sodium: 135 mmol/L — ABNORMAL LOW (ref 136–145)

## 2022-07-11 MED FILL — HEPARIN SODIUM (PORCINE) 5000 UNIT/ML IJ SOLN: 5000 UNIT/ML | INTRAMUSCULAR | Qty: 1

## 2022-07-11 MED FILL — SEVELAMER CARBONATE 2.4 G PO PACK: 2.4 g | ORAL | Qty: 2.4

## 2022-07-11 MED FILL — OXYCODONE HCL 5 MG PO TABS: 5 MG | ORAL | Qty: 1

## 2022-07-11 MED FILL — CEFTRIAXONE SODIUM 2 G IJ SOLR: 2 g | INTRAMUSCULAR | Qty: 2000

## 2022-07-11 MED FILL — THERA M PLUS PO TABS: ORAL | Qty: 1

## 2022-07-11 MED FILL — METRONIDAZOLE 500 MG/100ML IV SOLN: 500 MG/100ML | INTRAVENOUS | Qty: 100

## 2022-07-11 MED FILL — RISAQUAD-2 PO CAPS: ORAL | Qty: 1

## 2022-07-11 MED FILL — AMIODARONE HCL 200 MG PO TABS: 200 MG | ORAL | Qty: 1

## 2022-07-11 MED FILL — RENVELA 2.4 G PO PACK: 2.4 g | ORAL | Qty: 2.4

## 2022-07-11 NOTE — Progress Notes (Signed)
Hospitalist Progress Note    NAME:   Jaime [name]   DOB: 1946-02-02   MRN: [id number]     Date/Time: 07/11/2022 1:24 PM  Patient PCP: No primary care provider on file.    Estimated discharge date:9/12  Barriers: Reimaging, patient will need placement     For reference :   76 y/m with prolonged hospitalization, was admitted to surgical service on 7/31 for perforated hollow viscus underwent emergent laparotomy/ septic shock d/t peritonitis, was intubated and extubated x 2. Also had AKI needing CVVH, now on HD. Also has bilateral foot gangrene. Was transferred to hospitalist service on 8/21.      Assessment / Plan:  #Septic shock- resolved   D/t perforated viscus  S/p Laparotomy 7/31, with wound vac drainage   Liver abscess s/p IR drainage, hepatic drain to be remove !  Bacteremia   Final ID recommendation to continue:  -Ceftriaxone 2G   IV daily x 6 weeks end date 9/15  - Flagyl 500 mg po bid end date 9/8  - Pt will require repeat imaging of liver before conclusion of therapy  -Discussed with case management regarding discharge disposition - wife needs to set up financial assistance for transfer of patient to [place] . Other option could be to go to an SNF in [place] [facility] ) .    Continue ceftriaxone 2 g IV daily  Pt  for   6 weeks of coverage of  liver abscess planned end date 9/15.   Continue Flagyl given complicated history & persistent leucocytosis  Patient would require repeat imaging prior to DC of antimicrobials.  We will plan for repeat CT to next week if patient is here.           #Acute hypoxemic respiratory failure:  Intubated 7/31, extubation 8/7  -Reintubated 8/13, extubated on 8/19  -Currently on 2 liters NC  -Weaned off o2      #Dysphagia:  -Ongoing SLP evaluation-patient tolerating small diet .   -Aspiration on FEES study  -Follow-up SLP evaluation     #Acute kidney injury:  -Now on HD  -Continue with HD  -Nephrology following  -Case management informed the need to set up HD at SNF prior to discharge  .  -Will follow up cmp .     #Bilateral lower extremity critical limb ischemia  Gangrene both feet  -Podiatry and vascular surgery following  Cleared by podiatry for demarcation+ autoamputation    #Diabetes mellitus:  -Sliding scale and lantus  -Monitor FS  -Diabetes team following     #Sacral wound:  -Wound care following       Medical Decision Making:   I personally reviewed labs:CBC and BMP   I personally reviewed imaging: na   I personally reviewed EKG: None  Toxic drug monitoring: Monitor creatinine with abx  Discussed case with: RN, patient     Code Status: Full  DVT Prophylaxis: heparin  GI Prophylaxis:none   Subjective:     Chief Complaint / Reason for Physician Visit  Labs and charts reviewed and patient examined .   No new issues, continue moving his arm and feeds the swelling in the upper extremity improving a lot patient doing good overall         Objective:     VITALS:   Last 24hrs VS reviewed since prior progress note. Most recent are:  Patient Vitals for the past 24 hrs:   BP Temp Temp src Pulse Resp SpO2   07/11/22 1222 (!) 146/63  97.9 F (36.6 C) Axillary 85 18 96 %   07/11/22 0728 137/63 97.5 F (36.4 C) Oral 84 20 --   07/11/22 0354 (!) 146/70 97.7 F (36.5 C) Oral 87 20 98 %   07/10/22 2339 (!) 150/72 98 F (36.7 C) Oral 88 20 97 %   07/10/22 2115 -- -- -- -- 20 --   07/10/22 2020 (!) 146/70 98.2 F (36.8 C) Oral 92 20 --   07/10/22 1927 (!) 145/79 99 F (37.2 C) Axillary 91 -- --   07/10/22 1750 137/84 98.6 F (37 C) Oral 90 18 --   07/10/22 1410 (!) 146/74 98.6 F (37 C) -- 90 -- --   07/10/22 1406 (!) 146/72 -- -- 93 -- --   07/10/22 1400 -- -- -- 94 -- --   07/10/22 1345 -- -- -- 92 -- --   07/10/22 1330 -- -- -- 88 -- --   07/10/22 1325 127/72 97.8 F (36.6 C) Oral 90 16 --           Intake/Output Summary (Last 24 hours) at 07/11/2022 1324  Last data filed at 07/11/2022 0739  Gross per 24 hour   Intake 620 ml   Output 1520 ml   Net -900 ml          I had a face to face encounter and  independently examined this patient on 07/11/2022, as outlined below:  PHYSICAL EXAM:  General:          Alert, cooperative  EENT:              EOMI. Anicteric sclerae.Rt. hd catn - jugular   Resp:               CTA bilaterally, no wheezing or rales.  No accessory muscle use  CV:                  Regular  rhythm,  No edema, AS murmur   GI:                   Soft, Non distended,Mildly tender.  +Bowel sounds, wound vac in place , drain on rt upper quadrant - purulent material   Neurologic:       Alert and oriented X 3, normal speech,   Psych:   Good insight. Not anxious nor agitated  Skin:                No rashes.  No jaundice  Foot :  bilateral feet - gangrene of first second and third toes , not yet full demarcation           Reviewed most current lab test results and cultures  YES  Reviewed most current radiology test results   YES  Review and summation of old records today    NO  Reviewed patient's current orders and MAR    YES  PMH/SH reviewed - no change compared to H&P  ________________________________________________________________________  Care Plan discussed with:    Comments   Patient x    Family      RN x    Care Manager     Consultant                        Multidiciplinary team rounds were held today with case manager, nursing, pharmacist and Higher education careers adviser.  Patient's plan of care was discussed; medications were reviewed and discharge planning was addressed.     ________________________________________________________________________  Total NON critical care TIME:  52  Minutes    Total CRITICAL CARE TIME Spent:   Minutes non procedure based      Comments   >50% of visit spent in counseling and coordination of care     ________________________________________________________________________  Terence Lux, MD     Procedures: see electronic medical records for all procedures/Xrays and details which were not copied into this note but were reviewed prior to creation of Plan.      LABS:  I reviewed  today's most current labs and imaging studies.  Pertinent labs include:  Recent Labs     07/10/22  0429   WBC 9.6   HGB 8.9*   HCT 28.6*   PLT 175       Recent Labs     07/09/22  0309 07/10/22  0429 07/11/22  0347   NA 136 137 135*   K 3.7 3.8 3.5   CL 99 99 100   CO2 27 29 30    GLUCOSE 113* 128* 122*   BUN 39* 47* 23*   CREATININE 4.40* 5.29* 3.40*   CALCIUM 8.3* 8.4* 8.4*   MG  --  2.7*  --    PHOS 4.5 5.3* 3.2   LABALBU 2.1* 2.1*  2.1* 1.9*   BILITOT  --  1.0  --    AST  --  31  --    ALT  --  17  --          Signed: , MD

## 2022-07-12 LAB — BASIC METABOLIC PANEL
Anion Gap: 7 mmol/L (ref 5–15)
BUN: 29 MG/DL — ABNORMAL HIGH (ref 6–20)
Bun/Cre Ratio: 7 — ABNORMAL LOW (ref 12–20)
CO2: 27 mmol/L (ref 21–32)
Calcium: 8.4 MG/DL — ABNORMAL LOW (ref 8.5–10.1)
Chloride: 102 mmol/L (ref 97–108)
Creatinine: 4.24 MG/DL — ABNORMAL HIGH (ref 0.70–1.30)
Est, Glom Filt Rate: 14 mL/min/{1.73_m2} — ABNORMAL LOW (ref >60)
Glucose: 104 mg/dL — ABNORMAL HIGH (ref 65–100)
Potassium: 3.8 mmol/L (ref 3.5–5.1)
Sodium: 136 mmol/L (ref 136–145)

## 2022-07-12 LAB — POCT GLUCOSE
POC Glucose: 115 mg/dL (ref 65–117)
POC Glucose: 95 mg/dL (ref 65–117)
POC Glucose: 99 mg/dL (ref 65–117)

## 2022-07-12 MED FILL — HEPARIN SODIUM (PORCINE) 5000 UNIT/ML IJ SOLN: 5000 UNIT/ML | INTRAMUSCULAR | Qty: 1

## 2022-07-12 MED FILL — SEVELAMER CARBONATE 2.4 G PO PACK: 2.4 g | ORAL | Qty: 2.4

## 2022-07-12 MED FILL — RISAQUAD-2 PO CAPS: ORAL | Qty: 1

## 2022-07-12 MED FILL — THERA M PLUS PO TABS: ORAL | Qty: 1

## 2022-07-12 MED FILL — AMIODARONE HCL 200 MG PO TABS: 200 MG | ORAL | Qty: 1

## 2022-07-12 MED FILL — BPCO EX OINT: CUTANEOUS | Qty: 60

## 2022-07-12 MED FILL — CEFTRIAXONE SODIUM 2 G IJ SOLR: 2 g | INTRAMUSCULAR | Qty: 2000

## 2022-07-12 NOTE — Progress Notes (Signed)
Hospitalist Progress Note    NAME:   Jaime [name]   DOB: 14-Jun-1946   MRN: [id number]     Date/Time: 07/12/2022 2:34 PM  Patient PCP: No primary care provider on file.    Estimated discharge date:9/12  Barriers: Reimaging, patient will need placement     For reference :   76 y/m with prolonged hospitalization, was admitted to surgical service on 7/31 for perforated hollow viscus underwent emergent laparotomy/ septic shock d/t peritonitis, was intubated and extubated x 2. Also had AKI needing CVVH, now on HD. Also has bilateral foot gangrene. Was transferred to hospitalist service on 8/21.      Assessment / Plan:  #Septic shock- resolved   D/t perforated viscus  S/p Laparotomy 7/31, with wound vac drainage   Liver abscess s/p IR drainage, hepatic drain to be remove !  Bacteremia   Final ID recommendation to continue:  -Ceftriaxone 2G   IV daily x 6 weeks end date 9/15  - Flagyl 500 mg po bid end date 9/8  - Pt will require repeat imaging of liver before conclusion of therapy  -Discussed with case management regarding discharge disposition - wife needs to set up financial assistance for transfer of patient to [place] . Other option could be to go to an SNF in [place] [facility] ) .    Continue ceftriaxone 2 g IV daily  Pt  for   6 weeks of coverage of  liver abscess planned end date 9/15.   Continue Flagyl given complicated history & persistent leucocytosis  Patient would require repeat imaging prior to DC of antimicrobials.  We will plan for repeat CT to next week if patient is here.           #Acute hypoxemic respiratory failure:  Intubated 7/31, extubation 8/7  -Reintubated 8/13, extubated on 8/19  -Currently on 2 liters NC  -Weaned off o2      #Dysphagia:  -Ongoing SLP evaluation-patient tolerating small diet .   -Aspiration on FEES study  -Follow-up SLP evaluation     #Acute kidney injury:  -Now on HD  -Continue with HD  -Nephrology following  -Case management informed the need to set up HD at SNF prior to discharge  .  -Will follow up cmp .     #Bilateral lower extremity critical limb ischemia  Gangrene both feet  -Podiatry and vascular surgery following  Cleared by podiatry for demarcation+ autoamputation    #Diabetes mellitus:  -Sliding scale and lantus  -Monitor FS  -Diabetes team following     #Sacral wound:  -Wound care following       Medical Decision Making:   I personally reviewed labs:CBC and BMP   I personally reviewed imaging: na   I personally reviewed EKG: None  Toxic drug monitoring: Monitor creatinine with abx  Discussed case with: RN, patient     Code Status: Full  DVT Prophylaxis: heparin  GI Prophylaxis:none   Subjective:     Chief Complaint / Reason for Physician Visit  Labs and charts reviewed and patient examined .   No new issues, continue moving his arm and feeds the swelling in the upper extremity improving a lot patient doing good overall         Objective:     VITALS:   Last 24hrs VS reviewed since prior progress note. Most recent are:  Patient Vitals for the past 24 hrs:   BP Temp Temp src Pulse Resp SpO2   07/12/22 1120 (!) 149/78  97.5 F (36.4 C) Oral 89 19 --   07/12/22 0742 (!) 160/71 97.2 F (36.2 C) Axillary 81 -- 98 %   07/12/22 0300 (!) 154/81 -- -- -- -- --   07/12/22 0257 (!) 154/81 97.9 F (36.6 C) Axillary 86 18 98 %   07/11/22 2327 (!) 148/69 97.9 F (36.6 C) Axillary 87 16 99 %   07/11/22 2002 (!) 169/65 97.7 F (36.5 C) Axillary 94 18 97 %   07/11/22 1538 (!) 176/78 99.2 F (37.3 C) Axillary 90 18 97 %           Intake/Output Summary (Last 24 hours) at 07/12/2022 1434  Last data filed at 07/11/2022 2234  Gross per 24 hour   Intake --   Output 50 ml   Net -50 ml          I had a face to face encounter and independently examined this patient on 07/12/2022, as outlined below:  PHYSICAL EXAM:  General:          Alert, cooperative  EENT:              EOMI. Anicteric sclerae.Rt. hd catn - jugular   Resp:               CTA bilaterally, no wheezing or rales.  No accessory muscle use  CV:                   Regular  rhythm,  No edema, AS murmur   GI:                   Soft, Non distended,Mildly tender.  +Bowel sounds, wound vac in place , drain on rt upper quadrant - purulent material   Neurologic:       Alert and oriented X 3, normal speech,   Psych:   Good insight. Not anxious nor agitated  Skin:                No rashes.  No jaundice  Foot :  bilateral feet - gangrene of first second and third toes , not yet full demarcation           Reviewed most current lab test results and cultures  YES  Reviewed most current radiology test results   YES  Review and summation of old records today    NO  Reviewed patient's current orders and MAR    YES  PMH/SH reviewed - no change compared to H&P  ________________________________________________________________________  Care Plan discussed with:    Comments   Patient x    Family      RN x    Care Manager     Consultant                        Multidiciplinary team rounds were held today with case manager, nursing, pharmacist and Higher education careers adviser.  Patient's plan of care was discussed; medications were reviewed and discharge planning was addressed.     ________________________________________________________________________  Total NON critical care TIME:  52  Minutes    Total CRITICAL CARE TIME Spent:   Minutes non procedure based      Comments   >50% of visit spent in counseling and coordination of care     ________________________________________________________________________  Terence Lux, MD     Procedures: see electronic medical records for all procedures/Xrays and details which were not copied into this note but were reviewed prior to creation  of Plan.      LABS:  I reviewed today's most current labs and imaging studies.  Pertinent labs include:  Recent Labs     07/10/22  0429   WBC 9.6   HGB 8.9*   HCT 28.6*   PLT 175       Recent Labs     07/10/22  0429 07/11/22  0347 07/12/22  0320   NA 137 135* 136   K 3.8 3.5 3.8   CL 99 100 102   CO2 29 30 27    GLUCOSE  128* 122* 104*   BUN 47* 23* 29*   CREATININE 5.29* 3.40* 4.24*   CALCIUM 8.4* 8.4* 8.4*   MG 2.7*  --   --    PHOS 5.3* 3.2  --    LABALBU 2.1*  2.1* 1.9*  --    BILITOT 1.0  --   --    AST 31  --   --    ALT 17  --   --          Signed: , MD

## 2022-07-13 LAB — POCT GLUCOSE
POC Glucose: 127 mg/dL — ABNORMAL HIGH (ref 65–117)
POC Glucose: 106 mg/dL (ref 65–117)
POC Glucose: 85 mg/dL (ref 65–117)
POC Glucose: 87 mg/dL (ref 65–117)

## 2022-07-13 LAB — BASIC METABOLIC PANEL
Anion Gap: 9 mmol/L (ref 5–15)
BUN: 36 MG/DL — ABNORMAL HIGH (ref 6–20)
Bun/Cre Ratio: 7 — ABNORMAL LOW (ref 12–20)
CO2: 23 mmol/L (ref 21–32)
Calcium: 8.2 MG/DL — ABNORMAL LOW (ref 8.5–10.1)
Chloride: 102 mmol/L (ref 97–108)
Creatinine: 4.86 MG/DL — ABNORMAL HIGH (ref 0.70–1.30)
Est, Glom Filt Rate: 12 mL/min/{1.73_m2} — ABNORMAL LOW (ref >60)
Glucose: 107 mg/dL — ABNORMAL HIGH (ref 65–100)
Potassium: 3.9 mmol/L (ref 3.5–5.1)
Sodium: 134 mmol/L — ABNORMAL LOW (ref 136–145)

## 2022-07-13 LAB — HEPATIC FUNCTION PANEL
ALT: 17 U/L (ref 12–78)
AST: 26 U/L (ref 15–37)
Albumin/Globulin Ratio: 0.4 — ABNORMAL LOW (ref 1.1–2.2)
Albumin: 1.8 g/dL — ABNORMAL LOW (ref 3.5–5.0)
Alk Phosphatase: 233 U/L — ABNORMAL HIGH (ref 45–117)
Bilirubin, Direct: 0.5 MG/DL — ABNORMAL HIGH (ref 0.0–0.2)
Globulin: 4.6 g/dL — ABNORMAL HIGH (ref 2.0–4.0)
Total Bilirubin: 0.9 MG/DL (ref 0.2–1.0)
Total Protein: 6.4 g/dL (ref 6.4–8.2)

## 2022-07-13 LAB — PHOSPHORUS: Phosphorus: 4.7 MG/DL (ref 2.6–4.7)

## 2022-07-13 LAB — CBC
Hematocrit: 25.7 % — ABNORMAL LOW (ref 36.6–50.3)
Hemoglobin: 7.8 g/dL — ABNORMAL LOW (ref 12.1–17.0)
MCH: 31.8 PG (ref 26.0–34.0)
MCHC: 30.4 g/dL (ref 30.0–36.5)
MCV: 104.9 FL — ABNORMAL HIGH (ref 80.0–99.0)
MPV: 10.9 FL (ref 8.9–12.9)
Nucleated RBCs: 0 PER 100 WBC
Platelets: 157 10*3/uL (ref 150–400)
RBC: 2.45 M/uL — ABNORMAL LOW (ref 4.10–5.70)
RDW: 17.1 % — ABNORMAL HIGH (ref 11.5–14.5)
WBC: 7.8 10*3/uL (ref 4.1–11.1)
nRBC: 0 10*3/uL (ref 0.00–0.01)

## 2022-07-13 LAB — MAGNESIUM: Magnesium: 2.8 mg/dL — ABNORMAL HIGH (ref 1.6–2.4)

## 2022-07-13 MED ORDER — ALBUMIN HUMAN 25 % IV SOLN
25 % | INTRAVENOUS | Status: AC
Start: 2022-07-13 — End: 2022-07-13
  Administered 2022-07-13: 13:00:00 25 via INTRAVENOUS

## 2022-07-13 MED ORDER — HEPARIN SODIUM (PORCINE) 1000 UNIT/ML IJ SOLN
1000 UNIT/ML | INTRAMUSCULAR | Status: AC
Start: 2022-07-13 — End: 2022-07-13
  Administered 2022-07-13: 15:00:00 1800

## 2022-07-13 MED FILL — RETACRIT 3000 UNIT/ML IJ SOLN: 3000 UNIT/ML | INTRAMUSCULAR | Qty: 2

## 2022-07-13 MED FILL — CEFTRIAXONE SODIUM 2 G IJ SOLR: 2 g | INTRAMUSCULAR | Qty: 2000

## 2022-07-13 MED FILL — RENVELA 2.4 G PO PACK: 2.4 g | ORAL | Qty: 2.4

## 2022-07-13 MED FILL — HEPARIN SODIUM (PORCINE) 5000 UNIT/ML IJ SOLN: 5000 UNIT/ML | INTRAMUSCULAR | Qty: 1

## 2022-07-13 MED FILL — SEVELAMER CARBONATE 2.4 G PO PACK: 2.4 g | ORAL | Qty: 2.4

## 2022-07-13 MED FILL — HEPARIN SODIUM (PORCINE) 1000 UNIT/ML IJ SOLN: 1000 UNIT/ML | INTRAMUSCULAR | Qty: 10

## 2022-07-13 MED FILL — THERA M PLUS PO TABS: ORAL | Qty: 1

## 2022-07-13 MED FILL — RISAQUAD-2 PO CAPS: ORAL | Qty: 1

## 2022-07-13 MED FILL — AMIODARONE HCL 200 MG PO TABS: 200 MG | ORAL | Qty: 1

## 2022-07-13 MED FILL — ALBUTEIN 25 % IV SOLN: 25 % | INTRAVENOUS | Qty: 100

## 2022-07-13 NOTE — Progress Notes (Signed)
NAME: Jaime [name]        DOB:  [birth year]        MRN:  [id number]                     Assessment   :                                               Plan:  AKI  Pancreatitis  Hyponatremia  DM  Anemia  Toe ischemia KRT initiated 7/31; now on MWF schedule; perm cath 8/30;  seen on HD at 0900.  SBP dropped on HD.  Decrease UF.    Needs outpatient HD placement under AKI diagnosis - CM working on options (here or back in [place])    Watch for renal recovery, but none so far.    H/H not at goal.  Continue ESA.    D/W pt, RN, HD RN            Subjective:     Chief Complaint:  "I feel fine."  No N/V.  No dyspnea.  No pain.          Review of Systems:    Symptom Y/N Comments  Symptom Y/N Comments   Fever/Chills    Chest Pain     Poor Appetite    Edema     Cough    Abdominal Pain     Sputum    Joint Pain     SOB/DOE    Pruritis/Rash     Nausea/vomit    Tolerating PT/OT     Diarrhea    Tolerating Diet     Constipation    Other       Could not obtain due to:      Objective:     VITALS:   Last 24hrs VS reviewed since prior progress note. Most recent are:  Vitals:    07/13/22 0820   BP: (!) 159/84   Pulse: 83   Resp:    Temp: 97.6 F (36.4 C)   SpO2:        Intake/Output Summary (Last 24 hours) at 07/13/2022 1001  Last data filed at 07/13/2022 0530  Gross per 24 hour   Intake --   Output 50 ml   Net -50 ml        Telemetry Reviewed:     PHYSICAL EXAM:  General: NAD  trace edema  R TDC intact  B/l toes- dry gangrene      Lab Data Reviewed: (see below)    Medications Reviewed: (see below)    PMH/SH reviewed - no change compared to H&P  ________________________________________________________________________  Care Plan discussed with:  Patient     Family      RN     Care Manager                    Consultant:          Comments   >50% of visit spent in counseling and coordination of care        ________________________________________________________________________  Marina Gravel, MD     Procedures: see electronic medical records for all procedures/Xrays and details which  were not copied into this note but were reviewed prior to creation of Plan.      LABS:  Recent Labs     07/13/22  [phone]  WBC 7.8   HGB 7.8*   HCT 25.7*   PLT 157       Recent Labs     07/11/22  0347 07/12/22  0320 07/13/22  0319   NA 135* 136 134*   K 3.5 3.8 3.9   CL 100 102 102   CO2 30 27 23    BUN 23* 29* 36*   MG  --   --  2.8*   PHOS 3.2  --  4.7         MEDICATIONS:  Current Facility-Administered Medications   Medication Dose Route Frequency    albumin human 25% 25 % IV solution        sevelamer (RENVELA) packet 2.4 g  2.4 g Oral TID WC    heparin (porcine) 1000 UNIT/ML injection 1,800 Units  1,800 Units IntraCATHeter PRN    And    heparin (porcine) 1000 UNIT/ML injection 1,800 Units  1,800 Units IntraCATHeter PRN    guaiFENesin (ROBITUSSIN) 100 MG/5ML liquid 200 mg  200 mg Oral Q4H PRN    heparin (porcine) injection 5,000 Units  5,000 Units SubCUTAneous 3 times per day    cefTRIAXone (ROCEPHIN) 2,000 mg in sodium chloride 0.9 % 50 mL IVPB (mini-bag)  2,000 mg IntraVENous Q24H    therapeutic multivitamin-minerals 1 tablet  1 tablet Per NG tube Daily    amiodarone (CORDARONE) tablet 200 mg  200 mg Per NG tube Daily    acidophilus probiotic capsule 1 capsule  1 capsule Oral Daily    lidocaine 2 % injection 20 mL  20 mL IntraDERmal Once    collagenase ointment   Topical Daily    epoetin alfa-epbx (RETACRIT) injection 6,000 Units  6,000 Units SubCUTAneous Once per day on Mon Wed Fri    fentaNYL (SUBLIMAZE) injection 25 mcg  25 mcg IntraVENous Q1H PRN    oxyCODONE (ROXICODONE) immediate release tablet 5 mg  5 mg Oral Q4H PRN    midodrine (PROAMATINE) tablet 5 mg  5 mg Oral Daily PRN    insulin lispro (HUMALOG) injection vial 0-4 Units  0-4 Units SubCUTAneous 4 times per day    bisacodyl (DULCOLAX) suppository 10 mg  10 mg Rectal  Daily PRN    albumin human 25% IV solution 25 g  25 g IntraVENous PRN    sodium chloride 0.9 % bolus 100 mL  100 mL IntraVENous PRN    balsum peru-castor oil (VENELEX) ointment   Topical BID    glucose chewable tablet 16 g  4 tablet Oral PRN    dextrose bolus 10% 125 mL  125 mL IntraVENous PRN    Or    dextrose bolus 10% 250 mL  250 mL IntraVENous PRN    glucagon (rDNA) injection 1 mg  1 mg SubCUTAneous PRN    dextrose 10 % infusion   IntraVENous Continuous PRN    sodium chloride flush 0.9 % injection 5-40 mL  5-40 mL IntraVENous 2 times per day    sodium chloride flush 0.9 % injection 5-40 mL  5-40 mL IntraVENous PRN    0.9 % sodium chloride infusion   IntraVENous PRN    acetaminophen (TYLENOL) tablet 650 mg  650 mg Oral Q6H PRN    Or    acetaminophen (TYLENOL) suppository 650 mg  650 mg Rectal Q6H PRN    ondansetron (ZOFRAN) injection 4 mg  4 mg IntraVENous Q6H PRN

## 2022-07-13 NOTE — Other (Signed)
07/13/22 1120   Treatment   Time Off 1120   Treatment Goal off; 3hr treatment   Observations & Evaluations   Level of Consciousness 0   Oriented X 4   Heart Rhythm Regular   Respiratory Quality/Effort Unlabored   O2 Device None (Room air)   Bilateral Breath Sounds Clear   RUE Edema +2;Pitting   LUE Edema +2;Pitting   RLE Edema None   LLE Edema None   Vital Signs   BP (!) 158/85   Temp 97.6 F (36.4 C)   Pulse 78   Respirations 16   Pain Assessment   Pain Assessment None - Denies Pain   Tunneled Hemodialysis Catheter Right Subclavian   Placement Date: 07/01/22   Present on Admission/Arrival: No  Inserted by: Hope Riddell-NP  Insertion Practices: Chlorohexadine skin antisepsis;Maximal barrier precautions;Sterile ultrasound technique;Optimal catheter site selection;Hand hygiene  Orien...   Access Status  Not Accessed   Continued need for line? Yes   Site Assessment Clean, dry & intact   Venous Lumen Status Flushed;Heparin locked;Capped   Arterial Lumen Status Flushed;Heparin locked;Capped   Alcohol Cap Used No   Line Care Connections checked and tightened;Ports disinfected   Dressing Type Bacteriocidal;Transparent   Date of Last Dressing Change 07/10/22   Dressing Status Clean, dry & intact   Dressing Change Due 07/14/22   Treatment Initiation   Dialyze Hours 3   During Hemodialysis Assessment   Ultrafiltration Removed (mL) 900 ml/min   Post-Hemodialysis Assessment   Post-Treatment Procedures Blood returned;Catheter capped, clamped and heparinized x 2 ports   Control and instrumentation engineer Disinfection;Acid/Vinegar Clean;Heat Disinfect   Rinseback Volume (ml) 300 ml   Blood Volume Processed (Liters) 67.7 L   Dialyzer Clearance Lightly streaked   Duration of Treatment (minutes) 180 minutes   Hemodialysis Intake (ml) 600 ml   Hemodialysis Output (ml) 900 ml   NET Removed (ml) 300   Tolerated Treatment Fair   Interventions Taken Ultrafiltration stopped;Ultrafiltration goal decreased;Other  (Comment)  (Albumin given; Uf back on once BP rebounded; patient was asymptomatic)   Physician Notified Yes   Patient Disposition Return to room     Primary RN SBAR: Judeth Cornfield Day, RN  Comments: patient had an asymptomatic episode of hypotension; alleviated with PRN Albumin and changes to UF rate and fluid removal goals. Md aware.

## 2022-07-13 NOTE — Care Coordination-Inpatient (Addendum)
Transition of Care Plan:     RUR: 16% (moderate RUR)  Prior Level of Functioning: Independent - lives with wife in [place], [place]  Disposition: LTAC - Copywriter, advertising in [place] - bed and auth needed.  If SNF or IPR: Date FOC offered: 06/30/2022  Date FOC received:   Accepting facility: TBD  Date authorization started with reference number: TBD  Date authorization received and expires: TBD  Follow up appointments: defer to post discharge facility  DME needed: defer to post discharge facility  Transportation at discharge:   IM/IMM Medicare/Tricare letter given: 2nd IM needed prior to discharge.  Is patient a Veteran and connected with VA? Patient is not service connected.              If yes, was [employer] transfer form completed and VA notified? N/A  Caregiver Contact:  Tynell Winchell - Wife - [phone]  Discharge Caregiver contacted prior to discharge? Patient to contact.  Care Conference needed? no  Barriers to discharge: new HD, wound vac/wound care orders, placement, insurance authorization    [phone] - CM sent a message to [facility] to begin authorization for the [facility] location, if possible.    1544 - CM contacted [employer] to verify if they can begin authorization, unable to reach anyone, VM left with admissions.      ----     Previous CM note: Patient is not service connected at the [place]; only benefits he would qualify for under [facility] is hospice.  Previous CM was informed that [facility] will provide one time charity for transport to [facility].  [facility] likely cannot start authorization until Monday due to not having an HD bed available.  CM spoke with MFA liaison, Thayer Ohm, who confirmed [facility] has a facility in [place],  call [facility], but patient will still need to cover the cost of transportation to and from HD as they most likely do not have HD available, especially considering the patient has not been documented as having ESRD.  CM requested earlier this  week during IDRs that clear wound care orders are placed to determine wound care needs post wound vac removal.        Ronnell Freshwater, BSN, RN    Care Management  [phone]

## 2022-07-13 NOTE — Progress Notes (Signed)
Infectious Disease Progress    Impression      Hepatic abscess  Gas and fluid containing collection 7.1 x 10.8 x 5.4 cm  In anterior left lobe of liver, patchy areas of hypodensity right lobe  4 x 4.5 x 5.5 cm collection  S/p drainage by IR 8/4.Cultures + for  few E.coli  Repeat CT 8/7 showed reduction in size of hepatic abscess  Plan was for ceftriaxone IV x6 weeks end date 9/15, Flagyl  Until 8/18, now continued due to persistent leukocytosis  CT 8/13 + for    S/p percutaneous drainage of left hepatic abscess with  Minimal fluid remaining around catheter, suggestion of peripancreatic edema, small bilateral pleural effusions  Repeat CT abdomen pelvis 8/16+ for increased peripancreatic edema suspicious for pancreatitis, small residual fluid collection in left hepatic lobe  Lipase 737  S/p change of bag by IR .  Output less 30cc/ 24 h      Sepsis  Septic shock.  Resolved    E. coli bacteremia  Blood cultures 7/31+ for E. coli   2/2 LAC (pan sensitive)   Negative repeat cultures 8/4   Treated.    Acute abdomen  Pneumoperitoneum  S/p diagnostic laparoscopy, laparotomy  EGD 7/31  Findings of cloudy fluid in right upper quadrant, yellow/white  Inflammatory peel over lesser curve of stomach, inferior lobe of liver  No gross perforation identified  Pancreas and retroperitoneum appeared edematous  Intra-Op fluid culture + for E. Coli (pansensitive)    S/p abdominal wound dehiscence  Clean, no purulence per wound care  Cultures 8/17, 8/18-NG.    Leukocytosis   Now resolved wbc 9.6      Gangrene, discoloration of toes  Persist      Acute hypoxic respiratory failure  Re intubated 8/13, extubated 8/19  Initially intubated 7/31, s/p extubation 8/7  CXR  8/20 + + for atelectasis  Resp cultures  8/13+ for light yeast  Apparent C.albicans/ dubliensis        AKI  Cr 4.86 on HD    Coagulopathy  Improving    Thrombocytopenia  resolved      Transaminitis  Improving    Hyperbilirubinemia   Improved      Diabetes type  2  Hyperglycemia  A1c 7.7    Diarrhea  resolved    Obesity  BMI 34.76  Plan  Continue ceftriaxone 2 g IV daily  Pt  for  6 weeks of coverage of  liver abscess planned end date 9/15.   Plan for CT on Wednesday prior to HD  If pt is still here    .  Antimicrobial orders for discharge  -Ceftriaxone 2G   IV daily x 6 weeks end date 9/15  - Flagyl 500 mg po bid end date 9/8 ( today)   - Pt will require repeat imaging of liver before conclusion of therapy  -Weekly CBC, CMP-  -Encourage adequate fluids, daily probiotic/yogurt  -ID follow-up - if pt remains in [place] [facility] [place] call [phone]   For follow up.    Possible adverse effects of long term antibiotics are inclusive of but not limited to following  BM suppression, neutropenia , cytopenias , aplastic anemia hemorrhage liver & renal dysfunction/ liver , renal failure  , GI dysfunction- N, V  Diarrhea,C.difficile disease, rash , allergy , anaphylaxis.toxic epidermal necrolysis  Neuro toxicity , seizure disorder  Side effects tend to be more pronounced in the elderly  Pt to report persistent symptoms such as N,V , diarrhea,  numbness tingling of extremities  or any other symptoms      May DC from ID standpoint        Extensive review of chart notes, labs, imaging, cultures done  Additionally review of done: Drain output      Patient seen today.  Awake, denies new complaints.  D/w Nursing.  Drain output less.      History reviewed. No pertinent past medical history.    Past Surgical History:   Procedure Laterality Date    BLADDER SURGERY N/A 06/01/2022    ENDOSCOPY OF ILEAL CONDUIT performed by Guinevere Ferrari, MD at [facility] MAIN OR    CT VISCERAL PERCUTANEOUS DRAIN  06/05/2022    CT VISCERAL PERCUTANEOUS DRAIN 06/05/2022 [facility] RAD CT    IR TUNNELED CATHETER PLACEMENT GREATER THAN 5 YEARS  07/01/2022    IR TUNNELED CATHETER PLACEMENT GREATER THAN 5 YEARS 07/01/2022 [facility], APRN - NP [facility] ANGIO IR    LAPAROSCOPY N/A 06/01/2022    LAPAROSCOPY DIAGNOSTIC performed by Guinevere Ferrari, MD at [facility] MA[facility]    LAPAROTOMY N/A 06/01/2022    LAPAROTOMY EXPLORATORY performed by Guinevere Ferrari, MD at [facility] MA[facility] OR       Allergies   Allergen Reactions    Augmentin [Amoxicillin-Pot Clavulanate] Hives     Tolerated ceftriaxone 06/2022    Codeine      Unknown reaction       Social Connections: Not on file       No family status information on file.           Review of Systems - Negative except those mentioned in H&P      PHYSICAL EXAM:  General:          Awake, in no distress  EENT:              EOMI. Anicteric sclerae. MMM  Resp:               CTA bilaterally, no wheezing or rales.  No accessory muscle use  CV:                  Regular  rhythm,  No edema  GI:                   Soft, Non distended, Non tender.  +Bowel sounds  Neurologic:      Alert and oriented X 3, normal speech,   Psych:             Good insight. Not anxious nor agitated  Skin:                No rashes.  No jaundice.  Extremities :  No edema, discoloration of  toes++.   Drain+ minimal drainage  Waymon Amato, MD Jerrel Ivory

## 2022-07-13 NOTE — Progress Notes (Signed)
Braden Score 12. All of the following interventions have been implemented to prevent pressure injury:     SKIN ASSESSMENT (S)  Dual skin assessment completed at shift change: Yes  Name of second RN who completed Dual Skin Assessment: Judeth Cornfield RN  Picture of wound uploaded to EMR: Yes  Venelex ordered and given per protocol: Yes  Wound care consulted if wounds present: Yes     SURFACE (S)  Stryker air pump or specialty bed ordered: Yes  Type of bed: Stryker/Hercules  Only white chux used with specialty surfaces (no green chux used): Yes  Waffle cushion used for chair positioning: NA     KEEP MOVING (K)  Mobility status (Bedrest, Chairbound, UWA x 1 assist , 2 assist, Max assist): BEDREST  Q2 hour turns documented: Yes  Refusals to turn and education provided documented: NA  Device used to float heels: : Blue heel elevating cushion/pillows  PT/OT consulted: Yes     INCONTINENCE (I)  Incontinence status assessed Q2 hours: Yes  External catheter in use: Male ext cath  Barrier cream in use: YES     NUTRITION (N)  I/O's documented every 8 hours: Yes  Oral supplements ordered if appropriate: Yes  Nutrition services consulted: Yes     All concerns about new DTI's must be escalated directly to attending MD, charge nurse, CCL and nurse director

## 2022-07-13 NOTE — Progress Notes (Addendum)
1800: Pt lost IV access today. PICC team notified to help with access. PIV placed at 1550, when iv was flushed to give iv rocephin site was painful and infiltrating. MD Roseanne Reno notified to see about getting PICC line for pt and that IV ceftriaxone was unable to be administered. Per MD ok to hold off on PICC line for now. No new orders received.   1930: Bedside and Verbal shift change report given to Mia, RN (oncoming nurse) by Judeth Cornfield RN (offgoing nurse).  Report given with SBAR, Kardex, Intake/Output, MAR and Recent Results.

## 2022-07-13 NOTE — Progress Notes (Signed)
Hospitalist Progress Note    NAME:   Jaime [name]   DOB: 12/23/1945   MRN: [id number]     Date/Time: 07/13/2022 2:55 PM  Patient PCP: No primary care provider on file.    Estimated discharge date:9/12  Barriers: need placement    For reference :   76 y/m with prolonged hospitalization, was admitted to surgical service on 7/31 for perforated hollow viscus underwent emergent laparotomy/ septic shock d/t peritonitis, was intubated and extubated x 2. Also had AKI needing CVVH, now on HD. Also has bilateral foot gangrene. Was transferred to hospitalist service on 8/21.      Assessment / Plan:  #Septic shock- resolved   D/t perforated viscus  S/p Laparotomy 7/31, with wound vac drainage   Liver abscess s/p IR drainage, hepatic drain to be remove !  Bacteremia   Final ID recommendation to continue:  -Ceftriaxone 2G   IV daily x 6 weeks end date 9/15  - Flagyl 500 mg po bid end date 9/8  - Pt will require repeat imaging of liver before conclusion of therapy  -Discussed with case management regarding discharge disposition - wife needs to set up financial assistance for transfer of patient to [place] . Other option could be to go to an SNF in [place] [facility] ) .    Continue ceftriaxone 2 g IV daily  Pt  for   6 weeks of coverage of  liver abscess planned end date 9/15.   Continue Flagyl given complicated history & persistent leucocytosis  Patient would require repeat imaging prior to DC of antimicrobials.  We will plan for repeat CT to next week if patient is here.           #Acute hypoxemic respiratory failure:  Intubated 7/31, extubation 8/7  -Reintubated 8/13, extubated on 8/19  -Currently on 2 liters NC  -Weaned off o2      #Dysphagia:  -Ongoing SLP evaluation-patient tolerating small diet .   -Aspiration on FEES study  -Follow-up SLP evaluation     #Acute kidney injury:  -Now on HD  -Continue with HD  -Nephrology following  -Case management informed the need to set up HD at SNF prior to discharge .  -Will follow up cmp  .     #Bilateral lower extremity critical limb ischemia  Gangrene both feet  -Podiatry and vascular surgery following  Cleared by podiatry for demarcation+ autoamputation    #Diabetes mellitus:  -Sliding scale and lantus  -Monitor FS  -Diabetes team following     #Sacral wound:  -Wound care following       Medical Decision Making:   I personally reviewed labs:CBC and BMP   I personally reviewed imaging: na   I personally reviewed EKG: None  Toxic drug monitoring: Monitor creatinine with abx  Discussed case with: RN, patient     Code Status: Full  DVT Prophylaxis: heparin  GI Prophylaxis:none   Subjective:     Chief Complaint / Reason for Physician Visit  Labs and charts reviewed and patient examined .   No new issues, continue moving his arm and feeds the swelling in the upper extremity improving a lot patient doing good overall         Objective:     VITALS:   Last 24hrs VS reviewed since prior progress note. Most recent are:  Patient Vitals for the past 24 hrs:   BP Temp Temp src Pulse Resp   07/13/22 1200 (!) 155/84 97.3 F (36.3 C) Axillary -- --  07/13/22 1120 (!) 158/85 97.6 F (36.4 C) -- 78 16   07/13/22 1115 (!) 167/80 -- -- 78 --   07/13/22 1100 (!) 157/82 -- -- 82 --   07/13/22 1045 (!) 168/85 -- -- 79 --   07/13/22 1030 (!) 163/81 -- -- 79 --   07/13/22 1015 (!) 153/82 -- -- 77 --   07/13/22 1000 (!) 154/80 -- -- 79 --   07/13/22 0945 138/76 -- -- 80 --   07/13/22 0930 (!) 146/76 -- -- 80 --   07/13/22 0915 (!) 143/70 -- -- 81 --   07/13/22 0900 109/63 -- -- 88 --   07/13/22 0857 108/65 -- -- 86 --   07/13/22 0845 (!) 88/47 -- -- 89 --   07/13/22 0830 (!) 163/83 -- -- 81 --   07/13/22 0820 (!) 159/84 97.6 F (36.4 C) -- 83 16   07/13/22 0340 124/65 97.7 F (36.5 C) Axillary 85 --   07/13/22 0010 (!) 150/73 98 F (36.7 C) Axillary 93 --   07/12/22 2034 (!) 160/81 99 F (37.2 C) Axillary 100 --   07/12/22 1534 (!) 154/75 98.3 F (36.8 C) Oral 96 18           Intake/Output Summary (Last 24 hours) at  07/13/2022 1455  Last data filed at 07/13/2022 1120  Gross per 24 hour   Intake 600 ml   Output 950 ml   Net -350 ml          I had a face to face encounter and independently examined this patient on 07/13/2022, as outlined below:  PHYSICAL EXAM:  General:          Alert, cooperative  EENT:              EOMI. Anicteric sclerae.Rt. hd catn - jugular   Resp:               CTA bilaterally, no wheezing or rales.  No accessory muscle use  CV:                  Regular  rhythm,  No edema, AS murmur   GI:                   Soft, Non distended,Mildly tender.  +Bowel sounds, wound vac in place , drain on rt upper quadrant - purulent material   Neurologic:       Alert and oriented X 3, normal speech,   Psych:   Good insight. Not anxious nor agitated  Skin:                No rashes.  No jaundice  Foot :  bilateral feet - gangrene of first second and third toes , not yet full demarcation           Reviewed most current lab test results and cultures  YES  Reviewed most current radiology test results   YES  Review and summation of old records today    NO  Reviewed patient's current orders and MAR    YES  PMH/SH reviewed - no change compared to H&P  ________________________________________________________________________  Care Plan discussed with:    Comments   Patient x    Family      RN x    Care Manager     Consultant                        Multidiciplinary team rounds  were held today with case manager, nursing, pharmacist and clinical coordinator.  Patient's plan of care was discussed; medications were reviewed and discharge planning was addressed.     ________________________________________________________________________  Total NON critical care TIME:  52  Minutes    Total CRITICAL CARE TIME Spent:   Minutes non procedure based      Comments   >50% of visit spent in counseling and coordination of care     ________________________________________________________________________  Terence Lux, MD     Procedures: see electronic  medical records for all procedures/Xrays and details which were not copied into this note but were reviewed prior to creation of Plan.      LABS:  I reviewed today's most current labs and imaging studies.  Pertinent labs include:  Recent Labs     07/13/22  0843   WBC 7.8   HGB 7.8*   HCT 25.7*   PLT 157       Recent Labs     07/11/22  0347 07/12/22  0320 07/13/22  0319   NA 135* 136 134*   K 3.5 3.8 3.9   CL 100 102 102   CO2 30 27 23    GLUCOSE 122* 104* 107*   BUN 23* 29* 36*   CREATININE 3.40* 4.24* 4.86*   CALCIUM 8.4* 8.4* 8.2*   MG  --   --  2.8*   PHOS 3.2  --  4.7   LABALBU 1.9*  --  1.8*   BILITOT  --   --  0.9   AST  --   --  26   ALT  --   --  17         Signed: , MD

## 2022-07-13 NOTE — Plan of Care (Signed)
Problem: Occupational Therapy - Adult  Goal: By Discharge: Performs self-care activities at highest level of function for planned discharge setting.  See evaluation for individualized goals.  Description: FUNCTIONAL STATUS PRIOR TO ADMISSION:     ADL Assistance: Independent, Ambulation Assistance: Independent, Transfer Assistance: Independent, Active Driver: Yes     HOME SUPPORT: Patient lived with spouse and was independent.    Occupational Therapy Goals:  Initiated 06/10/2022; Re-Evaluation 06/20/2022.  Weekly Reassessment 06/29/22 - Continue as below. Goal #6 added. Goals reviewed and revised PRN as per 9/7 weekly reevaluation.   1.  Patient will perform grooming at bed level with Moderate Assist within 7 day(s). Continue goal.  2.  Patient will perform self-feeding with Moderate Assist within 7 day(s). Continue goal.  3.  Patient will perform upper body dressing with Maximal Assist within 7 day(s). Continue goal.   4.  Patient will complete supine>sit with mod assist x2  within 7 day(s). Goal met, change to min A of 1.   5.  Patient will tolerate sitting EOB with fair sitting balance in preparation for ADLs within 7 day(s). Goal  met, change to good.  6.  Patient will participate in BUE therapeutic activity / exercise with Minimal Assist within 7 day(s). Goal met, change to min A.      Outcome: Progressing   OCCUPATIONAL THERAPY TREATMENT  Patient: Jaime [name] (76 y.o. male)  Date: 07/13/2022  Primary Diagnosis: Septicemia ([place]) [A41.9]  Gastric perforation ([place]) [K25.5]  Perforated abdominal viscus [R19.8]  Procedure(s) (LRB):  LAPAROTOMY EXPLORATORY (N/A)  LAPAROSCOPY DIAGNOSTIC (N/A)  ENDOSCOPY OF ILEAL CONDUIT (N/A) 42 Days Post-Op   Precautions: Modified Diet, General Precautions, Contact Precautions, Fall Risk                Chart, occupational therapy assessment, plan of care, and goals were reviewed.    ASSESSMENT  Patient continues to benefit from skilled OT services and is progressing towards goals.  Patient is received resting in bed, amenable to session despite fatigue from dialysis earlier today. Due to very limited mobility over past few days, patient participating in BUE exercise with active assist. Unfortunately, session limited by arrival of PICC team. Continue to recommend IPR at discharge.         PLAN :  Patient continues to benefit from skilled intervention to address the above impairments.  Continue treatment per established plan of care to address goals.    Recommendation for discharge: (in order for the patient to meet his/her long term goals): Therapy 3 hours/day 5-7 days/week    Other factors to consider for discharge: patient's current support system is unable to meet their requirements for physical assistance, high risk for falls, not safe to be alone, and concern for safely navigating or managing the home environment    IF patient discharges home will need the following DME:  TBD       SUBJECTIVE:   Patient stated "I try to stay upbeat, for myself and for you guys."    OBJECTIVE DATA SUMMARY:   Cognitive/Behavioral Status:   Patient pleasant, appropriate. Receptive to education.       Functional Mobility and Transfers for ADLs:  Bed Mobility:   Unable to attempt today 2/2 arrival of PICC team.       Therapeutic Exercise:   Patient participates RUE / LUE exercises with active assist. Tolerates passive stretching well.  -shoulder flexion > 90 (x10)  -modified chest press (x5)  -external/internal rotation (x10)  -elbow flexion/extension (x5)  Pain Rating:  Tolerates session well.       Activity Tolerance:   Fair   Please refer to the flowsheet for vital signs taken during this treatment.    After treatment:   Patient left in no apparent distress in bed and PICC team bedside (reports they will provide patient with call bell, tray table, and will position pillow to offload sacrum after completing line insertion)    COMMUNICATION/EDUCATION:   The patient's plan of care was discussed with:  physical therapist and registered nurse    Patient Education  Education Given To: Patient  Education Provided: Role of Therapy;Plan of Care  Education Method: Verbal  Barriers to Learning: None  Education Outcome: Verbalized understanding    Thank you for this referral.  Isabella Stalling, OT  Minutes: 10

## 2022-07-13 NOTE — Other (Signed)
07/13/22 0820   Treatment   Time On 0820   Treatment Goal 1kg in 3hr   Observations & Evaluations   Level of Consciousness 0   Oriented X 4   Heart Rhythm Regular   Respiratory Quality/Effort Unlabored   O2 Device None (Room air)   Bilateral Breath Sounds Clear   Skin Condition/Temp Warm;Dry;Fragile   RUE Edema +2   LUE Edema +2   RLE Edema None   LLE Edema None   Vital Signs   BP (!) 159/84   Temp 97.6 F (36.4 C)   Pulse 83   Respirations 16   Pain Assessment   Pain Assessment None - Denies Pain   Technical Checks   Dialysis Machine No. 04   RO Machine Number [id number]   Dialyzer Lot No. [id number]   Tubing Lot Number [id number]   All Connections Secure Yes   NS Bag Yes   Saline Line Double Clamped Yes   Dialyzer Revaclear 300   Prime Volume (mL) 200 mL   ICEBOAT I;C;E;B;O;A;T   RO Machine Log Sheet Completed Yes   Machine Alarm Self Test Completed;Passed   Child psychotherapist Function   Extracorporeal Chemical engineer Conductivity 13.9   Manual Conductivity 13.9   Machine Ph 7.2   Bleach Test (Neg) Yes   Bath Temperature 96.8 F (36 C)   Tunneled Hemodialysis Catheter Right Subclavian   Placement Date: 07/01/22   Present on Admission/Arrival: No  Inserted by: Hope Riddell-NP  Insertion Practices: Chlorohexadine skin antisepsis;Maximal barrier precautions;Sterile ultrasound technique;Optimal catheter site selection;Hand hygiene  Orien...   Access Status  Accessed   Continued need for line? Yes   Site Assessment Clean, dry & intact   Venous Lumen Status Brisk blood return;Flushed;Infusing   Arterial Lumen Status Brisk blood return;Flushed;Infusing   Alcohol Cap Used No   Line Care Connections checked and tightened;Ports disinfected   Dressing Type Bacteriocidal;Transparent   Date of Last Dressing Change 07/10/22   Dressing Status Clean, dry & intact   Treatment Initiation   Dialyze Hours 3   Treatment  Initiation Universal Precautions maintained;Lines secured to  patient;Connections secured;Prime given;Venous Parameters set;Arterial Parameters set;IT consultant engaged;Saline line double clamped   During Hemodialysis Assessment   Blood Flow Rate (ml/min) 400 ml/min   Arterial Pressure (mmHg) -160 mmHg   Venous Pressure (mmHg) 130   TMP 40   DFR 600   Comments Treatment initiation   Access Visible Yes   Ultrafiltration Rate (ml/hr) 200 ml/hr   Ultrafiltration Removed (mL) 0 ml/min   Provider Notification   Reason for Communication Evaluate  (new bath)   Provider Name Doylene Canard   Provider Notification Physician   Method of Communication Face to face   Dialysis Bath   K+ (Potassium) 3   Ca+ (Calcium) 2.5   Na+ (Sodium) 140   HCO3 (Bicarb) 35   Primary RN SBARLebron Quam, RN  Patient Education: procedural; albumin for hypotension and fluid displacement  Hepatitis B Surface Ag   Date/Time Value Ref Range Status   06/29/2022 09:53 AM <0.10 Index Final     Hep B S Ab   Date/Time Value Ref Range Status   06/29/2022 09:53 AM <3.10 mIU/mL Final

## 2022-07-14 LAB — POCT GLUCOSE
POC Glucose: 107 mg/dL (ref 65–117)
POC Glucose: 118 mg/dL — ABNORMAL HIGH (ref 65–117)
POC Glucose: 118 mg/dL — ABNORMAL HIGH (ref 65–117)
POC Glucose: 89 mg/dL (ref 65–117)

## 2022-07-14 MED ORDER — CEFDINIR 300 MG PO CAPS
300 MG | Freq: Every day | ORAL | Status: DC
Start: 2022-07-14 — End: 2022-07-23
  Administered 2022-07-14 – 2022-07-22 (×9): 300 mg via ORAL

## 2022-07-14 MED FILL — AMIODARONE HCL 200 MG PO TABS: 200 MG | ORAL | Qty: 1

## 2022-07-14 MED FILL — RENVELA 2.4 G PO PACK: 2.4 g | ORAL | Qty: 2.4

## 2022-07-14 MED FILL — HEPARIN SODIUM (PORCINE) 5000 UNIT/ML IJ SOLN: 5000 UNIT/ML | INTRAMUSCULAR | Qty: 1

## 2022-07-14 MED FILL — SEVELAMER CARBONATE 2.4 G PO PACK: 2.4 g | ORAL | Qty: 2.4

## 2022-07-14 MED FILL — THERA M PLUS PO TABS: ORAL | Qty: 1

## 2022-07-14 MED FILL — CEFDINIR 300 MG PO CAPS: 300 MG | ORAL | Qty: 1

## 2022-07-14 MED FILL — RISAQUAD-2 PO CAPS: ORAL | Qty: 1

## 2022-07-14 NOTE — Progress Notes (Signed)
Infectious Disease Progress    Impression      Hepatic abscess  Gas and fluid containing collection 7.1 x 10.8 x 5.4 cm  In anterior left lobe of liver, patchy areas of hypodensity right lobe  4 x 4.5 x 5.5 cm collection  S/p drainage by IR 8/4.Cultures + for  few E.coli  Repeat CT 8/7 showed reduction in size of hepatic abscess  Plan was for ceftriaxone IV x6 weeks end date 9/15, Flagyl  Until 8/18, now continued due to persistent leukocytosis  CT 8/13 + for    S/p percutaneous drainage of left hepatic abscess with  Minimal fluid remaining around catheter, suggestion of peripancreatic edema, small bilateral pleural effusions  Repeat CT abdomen pelvis 8/16+ for increased peripancreatic edema suspicious for pancreatitis, small residual fluid collection in left hepatic lobe  Lipase 737  S/p change of bag by IR .  Output less 30cc/ 24 h  Abdomen/pelvis 9/12+ for collection 5.8 x 3.3 cm  Previously 5 x 3 cm.  Peripancreatic fatty infiltration & stranding again noted.  Patient changed to [facility] today 2ry to lack of IV access      Sepsis  Septic shock.  Resolved    E. coli bacteremia  Blood cultures 7/31+ for E. coli   2/2 LAC (pan sensitive)   Negative repeat cultures 8/4   Treated.    Acute abdomen  Pneumoperitoneum  S/p diagnostic laparoscopy, laparotomy  EGD 7/31  Findings of cloudy fluid in right upper quadrant, yellow/white  Inflammatory peel over lesser curve of stomach, inferior lobe of liver  No gross perforation identified  Pancreas and retroperitoneum appeared edematous  Intra-Op fluid culture + for E. Coli (pansensitive)    S/p abdominal wound dehiscence  Clean, no purulence per wound care  Cultures 8/17, 8/18-NG.    Leukocytosis   Now resolved wbc 7.8      Gangrene, discoloration of toes  Persist      Acute hypoxic respiratory failure  Re intubated 8/13, extubated 8/19  Initially intubated 7/31, s/p extubation 8/7  CXR  8/20 + + for atelectasis  Resp cultures  8/13+ for light yeast  Apparent C.albicans/  dubliensis        AKI  Cr 4.86 on HD    Coagulopathy  Improving    Thrombocytopenia  resolved      Transaminitis  Improving    Hyperbilirubinemia   Improved      Diabetes type 2  Hyperglycemia  A1c 7.7    Diarrhea  resolved    Obesity  BMI 34.76  Plan  Pt was on ceftriaxone 2 g IV daily   Total antibiotic coverage 6 weeks ending 9/15  Currently on Omnicef p.o. due to lack of IV access.  We will plan to continue Omnicef p.o. x2 more weeks  ending 8 weeks on 9/ 29.    Antimicrobial orders for discharge  -Omnicef 300 mg p.o. daily end date 9/29  -- Pt will require repeat imaging of liver before conclusion of therapy  -Weekly CBC, CMP-  -Encourage adequate fluids, daily probiotic/yogurt  -ID follow-up - if pt remains in [place] call [phone]   For follow up.    Possible adverse effects of long term antibiotics are inclusive of but not limited to following  BM suppression, neutropenia , cytopenias , aplastic anemia hemorrhage liver & renal dysfunction/ liver , renal failure  , GI dysfunction- N, V  Diarrhea,C.difficile disease, rash , allergy , anaphylaxis.toxic epidermal necrolysis  Neuro toxicity , seizure  disorder  Side effects tend to be more pronounced in the elderly  Pt to report persistent symptoms such as N,V , diarrhea, numbness tingling of extremities  or any other symptoms      May DC from ID standpoint        Extensive review of chart notes, labs, imaging, cultures done  Additionally review of done: Drain output      Patient seen earlier today.  Awake, denies new complaints.  D/w Nursing.  Drain output less.  Planned for CT abdomen/pelvis    History reviewed. No pertinent past medical history.    Past Surgical History:   Procedure Laterality Date    BLADDER SURGERY N/A 06/01/2022    ENDOSCOPY OF ILEAL CONDUIT performed by Guinevere Ferrari, MD at [facility] MA[facility] OR    CT VISCERAL PERCUTANEOUS DRAIN  06/05/2022    CT VISCERAL PERCUTANEOUS DRAIN 06/05/2022 [facility] RAD CT    IR TUNNELED CATHETER PLACEMENT GREATER THAN 5  YEARS  07/01/2022    IR TUNNELED CATHETER PLACEMENT GREATER THAN 5 YEARS 07/01/2022 [facility] [place], APRN - NP [facility] ANGIO IR    LAPAROSCOPY N/A 06/01/2022    LAPAROSCOPY DIAGNOSTIC performed by Guinevere Ferrari, MD at [facility]    LAPAROTOMY N/A 06/01/2022    LAPAROTOMY EXPLORATORY performed by Guinevere Ferrari, MD at [facility]       Allergies   Allergen Reactions    Augmentin [Amoxicillin-Pot Clavulanate] Hives     Tolerated ceftriaxone 06/2022    Codeine      Unknown reaction       Social Connections: Not on file       No family status information on file.           Review of Systems - Negative except those mentioned in H&P      PHYSICAL EXAM:  General:          Awake, in no distress  EENT:              EOMI. Anicteric sclerae. MMM  Resp:               CTA bilaterally, no wheezing or rales.  No accessory muscle use  CV:                  Regular  rhythm,  No edema  GI:                   Soft, Non distended, Non tender.  +Bowel sounds  Neurologic:      Alert and oriented X 3, normal speech,   Psych:             Good insight. Not anxious nor agitated  Skin:                No rashes.  No jaundice.  Extremities :  No edema, discoloration of  toes++.   Drain+ minimal drainage  Waymon Amato, MD Jerrel Ivory

## 2022-07-14 NOTE — Progress Notes (Signed)
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    Needs outpatient HD placement under AKI diagnosis - CM working on options (here or back in [place])    Watch for renal recovery, but none so far.    H/H not at goal.  Continue ESA.    D/W pt            Subjective:     Chief Complaint:  No complaints.  No N/V.  No dyspnea.  No pain.          Review of Systems:    Symptom Y/N Comments  Symptom Y/N Comments   Fever/Chills    Chest Pain     Poor Appetite    Edema     Cough    Abdominal Pain     Sputum    Joint Pain     SOB/DOE    Pruritis/Rash     Nausea/vomit    Tolerating PT/OT     Diarrhea    Tolerating Diet     Constipation    Other       Could not obtain due to:      Objective:     VITALS:   Last 24hrs VS reviewed since prior progress note. Most recent are:  Vitals:    07/14/22 0828   BP: 139/71   Pulse: 89   Resp: 16   Temp: 97.8 F (36.6 C)   SpO2: 96%       Intake/Output Summary (Last 24 hours) at 07/14/2022 0947  Last data filed at 07/13/2022 2022  Gross per 24 hour   Intake 760 ml   Output 945 ml   Net -185 ml        Telemetry Reviewed:     PHYSICAL EXAM:  General: NAD  trace edema  R TDC intact  B/l toes- dry gangrene      Lab Data Reviewed: (see below)    Medications Reviewed: (see below)    PMH/SH reviewed - no change compared to H&P  ________________________________________________________________________  Care Plan discussed with:  Patient     Family      RN     Care Manager                    Consultant:          Comments   >50% of visit spent in counseling and coordination of care       ________________________________________________________________________  Marina Gravel, MD     Procedures: see electronic medical  records for all procedures/Xrays and details which  were not copied into this note but were reviewed prior to creation of Plan.      LABS:  Recent Labs     07/13/22  0843   WBC 7.8   HGB 7.8*   HCT 25.7*   PLT 157  Recent Labs     07/12/22  0320 07/13/22  0319   NA 136 134*   K 3.8 3.9   CL 102 102   CO2 27 23   BUN 29* 36*   MG  --  2.8*   PHOS  --  4.7         MEDICATIONS:  Current Facility-Administered Medications   Medication Dose Route Frequency    cefdinir (OMNICEF) capsule 300 mg  300 mg Oral Daily    sevelamer (RENVELA) packet 2.4 g  2.4 g Oral TID WC    heparin (porcine) 1000 UNIT/ML injection 1,800 Units  1,800 Units IntraCATHeter PRN    And    heparin (porcine) 1000 UNIT/ML injection 1,800 Units  1,800 Units IntraCATHeter PRN    guaiFENesin (ROBITUSSIN) 100 MG/5ML liquid 200 mg  200 mg Oral Q4H PRN    heparin (porcine) injection 5,000 Units  5,000 Units SubCUTAneous 3 times per day    therapeutic multivitamin-minerals 1 tablet  1 tablet Per NG tube Daily    amiodarone (CORDARONE) tablet 200 mg  200 mg Per NG tube Daily    acidophilus probiotic capsule 1 capsule  1 capsule Oral Daily    lidocaine 2 % injection 20 mL  20 mL IntraDERmal Once    collagenase ointment   Topical Daily    epoetin alfa-epbx (RETACRIT) injection 6,000 Units  6,000 Units SubCUTAneous Once per day on Mon Wed Fri    fentaNYL (SUBLIMAZE) injection 25 mcg  25 mcg IntraVENous Q1H PRN    oxyCODONE (ROXICODONE) immediate release tablet 5 mg  5 mg Oral Q4H PRN    midodrine (PROAMATINE) tablet 5 mg  5 mg Oral Daily PRN    insulin lispro (HUMALOG) injection vial 0-4 Units  0-4 Units SubCUTAneous 4 times per day    bisacodyl (DULCOLAX) suppository 10 mg  10 mg Rectal Daily PRN    albumin human 25% IV solution 25 g  25 g IntraVENous PRN    sodium chloride 0.9 % bolus 100 mL  100 mL IntraVENous PRN    balsum peru-castor oil (VENELEX) ointment   Topical BID    glucose chewable tablet 16 g  4 tablet Oral PRN    dextrose bolus 10% 125 mL  125  mL IntraVENous PRN    Or    dextrose bolus 10% 250 mL  250 mL IntraVENous PRN    glucagon (rDNA) injection 1 mg  1 mg SubCUTAneous PRN    dextrose 10 % infusion   IntraVENous Continuous PRN    sodium chloride flush 0.9 % injection 5-40 mL  5-40 mL IntraVENous 2 times per day    sodium chloride flush 0.9 % injection 5-40 mL  5-40 mL IntraVENous PRN    0.9 % sodium chloride infusion   IntraVENous PRN    acetaminophen (TYLENOL) tablet 650 mg  650 mg Oral Q6H PRN    Or    acetaminophen (TYLENOL) suppository 650 mg  650 mg Rectal Q6H PRN    ondansetron (ZOFRAN) injection 4 mg  4 mg IntraVENous Q6H PRN

## 2022-07-14 NOTE — Progress Notes (Signed)
Comprehensive Nutrition Assessment    Type and Reason for Visit:  Reassess    Nutrition Recommendations/Plan:   Continue consistent carb diet. Will liberalize renal restrictions to allow for more options and monitor labs. Encourage pt to select meal orders.   Continue Nepro shakes TID. If available will also send Magic cup/Mighty shake  Consider appetite stimulant      Malnutrition Assessment:  Malnutrition Status:  Insufficient data (06/04/22 1319)        Nutrition Assessment:     Chart reviewed and case discussed during IDR. Pt has been reporting low PO intake which the pt confirms. He reports a low appetite and low interest in the food provided. He also complains of dry mouth which may be negatively impacting his desire to eat as well. Will recommend appetite stimulant. He has Nepro shakes ordered TID, but is only drinking 1 daily. We discussed his need for increased nutrition to promote wound healing and pt was agreeable to try working on at least 2 shakes per day. K and Phos have been WNL. Will liberalize his diet to allow more options and monitor his renal labs to resume restrictions if needed. He cannot read the menu without his glasses, so I encouraged him to call to order meals and talk through his options daily.     Patient Vitals for the past 120 hrs:   PO Meals Eaten (%)   07/09/22 1212 26 - 50%     Wt Readings from Last 5 Encounters:   07/06/22 101.3 kg (223 lb 5.2 oz)   ]    Nutrition Related Findings:    Labs: BG 89-107-89, no chemistry.   Meds: probiotic, renvela, MVI\.  Edema: 2+ pitting BUE, trace edema generalized and BLE.   BM 9/10.  Wound Type: Surgical Incision, Wound Vac       Current Nutrition Intake & Therapies:    Average Meal Intake: 26-50%  Average Supplements Intake: 26-50%  ADULT ORAL NUTRITION SUPPLEMENT; Breakfast, Lunch, Dinner; Renal Oral Supplement  DIET ONE TIME MESSAGE;  DIET ONE TIME MESSAGE;  ADULT DIET; Easy to Chew; 5 carb choices (75 gm/meal); Low Phosphorus (Less than 1000  mg)    Anthropometric Measures:  Height: 172.7 cm (5' 8" )  Ideal Body Weight (IBW): 154 lbs (70 kg)       Current Body Weight: 223 lb 5.2 oz (101.3 kg), 154.2 % IBW. Weight Source: Bed Scale  Current BMI (kg/m2): 34        Weight Adjustment For: No Adjustment                 BMI Categories: Obese Class 1 (BMI 30.0-34.9)    Estimated Daily Nutrient Needs:  Energy Requirements Based On: Formula  Weight Used for Energy Requirements: Current  Energy (kcal/day): MSJ 2150 (1795 x 1.2)  Weight Used for Protein Requirements: Ideal  Protein (g/day): 119-140g (1.7-2gPro/kg IBW)  Method Used for Fluid Requirements: Other (Comment)  Fluid (ml/day): per MD    Nutrition Diagnosis:   Inadequate protein-energy intake related to impaired respiratory function as evidenced by NPO or clear liquid status due to medical condition  Dx continues. Diet advanced but PO intake remains low.     Nutrition Interventions:   Food and/or Nutrient Delivery: Continue Current Diet, Continue Oral Nutrition Supplement  Nutrition Education/Counseling: No recommendation at this time  Coordination of Nutrition Care: Continue to monitor while inpatient, Interdisciplinary Rounds       Goals:  Previous Goal Met: Progressing toward Goal(s)  Goals:  PO intake 50% or greater, by next RD assessment       Nutrition Monitoring and Evaluation:   Behavioral-Environmental Outcomes: None Identified  Food/Nutrient Intake Outcomes: Food and Nutrient Intake, Supplement Intake  Physical Signs/Symptoms Outcomes: Biochemical Data, Nutrition Focused Physical Findings, Skin, Weight, GI Status, Hemodynamic Status, Fluid Status or Edema    Discharge Planning:    Continue current diet, Continue Oral Nutrition Supplement     Kristin Bruins, RD  Contact: [phone]

## 2022-07-14 NOTE — Plan of Care (Signed)
Problem: Physical Therapy - Adult  Goal: By Discharge: Performs mobility at highest level of function for planned discharge setting.  See evaluation for individualized goals.  Description: FUNCTIONAL STATUS PRIOR TO ADMISSION: Patient was independent and active without use of DME. Drove himself to [place] from [place] for [place]CAR race. Denies home O2 use.     HOME SUPPORT PRIOR TO ADMISSION: The patient lived with wife however wife has her own medical issues and cannot provide physical assist.    Physical Therapy Goals  Re-assessed 07/14/22; goals remain appropriate  Re-Assessment 07/07/22 remain appropriate  1.  Patient will move from supine to sit and sit to supine, scoot up and down, and roll side to side in bed with mod  assist  within 7 day(s).   2. Patient will sit EOB x 15 minutes with contact guard assist within 7 days.    3.  Patient will perform sit to stand with maximal assistance x2 within 7 day(s).  4.  Patient will transfer from bed to chair and chair to bed via minimal lift equipment (best mover) within 7 day(s).    Re-Assessment 06/29/22  1.  Patient will move from supine to sit and sit to supine, scoot up and down, and roll side to side in bed with maximal assist of 1 within 7 day(s).   2. Patient will sit EOB x 15 minutes with contact guard assist within 7 days.    3.  Patient will perform sit to stand with maximal assistance x2 within 7 day(s).  4.  Patient will transfer from bed to chair and chair to bed via minimal lift equipment (best mover) within 7 day(s).    Reassessed 06/20/2022   1.  Patient will move from supine to sit and sit to supine, scoot up and down, and roll side to side in bed with maximal assist of 1 within 7 day(s).   2. Patient will sit EOB x 5 minutes with contact guard assist within 7 days.    3.  Patient will perform sit to stand with maximal assistance x2 within 7 day(s).  4.  Patient will transfer from bed to chair and chair to bed via minimal lift equipment (best mover)  within 7 day(s).    Initiated 06/10/2022  1.  Patient will move from supine to sit and sit to supine, scoot up and down, and roll side to side in bed with contact guard assist within 7 day(s).   2. Patient will sit EOB x 10 minutes with supervision/set-up assist within 7 days.    2.  Patient will perform sit to stand with moderate assistance x2 within 7 day(s).  3.  Patient will transfer from bed to chair and chair to bed with moderate assistance x2 using the least restrictive device within 7 day(s).  4.  Patient will ambulate with moderate assistance x2 for 5 feet with the least restrictive device within 7 day(s).   Outcome: Progressing   PHYSICAL THERAPY TREATMENT: WEEKLY REASSESSMENT    Patient: Jaime [name] 76 y.o. male)  Date: 07/14/2022  Primary Diagnosis: Septicemia (HCC) [A41.9]  Gastric perforation (HCC) [K25.5]  Perforated abdominal viscus [R19.8]  Procedure(s) (LRB):  LAPAROTOMY EXPLORATORY (N/A)  LAPAROSCOPY DIAGNOSTIC (N/A)  ENDOSCOPY OF ILEAL CONDUIT (N/A) 43 Days Post-Op   Precautions: Fall Risk                  ASSESSMENT :  Patient continues to benefit from skilled PT services and is slowly progressing towards goals.  Patient continues to demonstrates gross generalized weakness, impaired activity tolerance, impaired sitting balance, inability to mobilize OOB, and decreased overall independence following prolonged hospitalization. Patient able to complete rolling with Min A and supine<>sit Min-Mod Ax2. Upon sitting EOB, initially requiring heavy assist to maintain balance though with time able to sit with close supervision majority of time and intermittent CGA for steadying due to general unsteadiness. One instance of Max A as patient started to fall forward and was unable to correct independently. Patient tolerated sitting EOB x10 minutes, attempting to set up [facility] for sit<>stand transfer though during set up patient becoming more and more fatigued. Placed bilat UE on pull bar on Best Mover,  patient c/o R shoulder pain. Patient then reporting to fatigued to continue and requested to return to bed. Patient becoming anxious when feeling fatigued and having to wait for assist back to supine appearing to be fearful of falling,  therapist cuing patient on taking deep breaths and reassuring him on his ability (he wasn't demonstrating any signs of falling). Required Mod Ax2 sit>supine and total A for scooting. Remains very limited with his overall activity tolerance, will need to start pushing through fatigue in order to continue to progress. Encouraged patient to keep blinds open during the day so that he can observe the trees outside his window, may benefit from Music Therapy consult. Continue to recommend rehab at discharge as patient is very motivated and it's his best possible chance to return to PLOF.     Patient's progression toward goals since last assessment: Progressing toward all goals, sat on EOB x10 mins    Functional Outcome Measure:  The patient scored 8/24 on the AM-PAC outcome measure        PLAN :  Goals have been updated based on progression since last assessment.  Patient continues to benefit from skilled intervention to address the above impairments.    Recommendations and Planned Interventions:   bed mobility training, transfer training, gait training, therapeutic exercises, patient and family training/education, and therapeutic activities    Frequency/Duration: Patient will be followed by physical therapy to address goals,   per day/week to address goals.    Recommendation for discharge: (in order for the patient to meet his/her long term goals): Therapy 3 hours/day 5-7 days/week    Other factors to consider for discharge: patient's current support system is unable to meet their requirements for physical assistance, high risk for falls, not safe to be alone, and concern for safely navigating or managing the home environment    IF patient discharges home will need the following DME:  bedside commode, hospital bed, mechanical lift, rolling walker, sliding/transfer board, and wheelchair 18 inch     SUBJECTIVE:   Patient stated "I feel more steady sitting here."    OBJECTIVE DATA SUMMARY:     History reviewed. No pertinent past medical history.  Past Surgical History:   Procedure Laterality Date    BLADDER SURGERY N/A 06/01/2022    ENDOSCOPY OF ILEAL CONDUIT performed by Guinevere Ferrari, MD at [facility] MAIN OR    CT VISCERAL PERCUTANEOUS DRAIN  06/05/2022    CT VISCERAL PERCUTANEOUS DRAIN 06/05/2022 [facility] RAD CT    IR TUNNELED CATHETER PLACEMENT GREATER THAN 5 YEARS  07/01/2022    IR TUNNELED CATHETER PLACEMENT GREATER THAN 5 YEARS 07/01/2022 [facility] [place], APRN - NP [facility] RAD ANGIO IR    LAPAROSCOPY N/A 06/01/2022    LAPAROSCOPY DIAGNOSTIC performed by Guinevere Ferrari, MD at [facility] MA[facility] OR  LAPAROTOMY N/A 06/01/2022    LAPAROTOMY EXPLORATORY performed by Guinevere Ferrari, MD at [facility] MA[facility] OR     Home Situation:  Social/Functional History  Lives With: Spouse  Type of Home: House  Home Layout: One level  Home Equipment: None  ADL Assistance: Independent  Ambulation Assistance: Independent  Transfer Assistance: Independent  Active Driver: Yes  Critical Behavior:  Orientation  Overall Orientation Status: Within Functional Limits  Orientation Level: Oriented X4  Cognition  Overall Cognitive Status: WFL    Hearing:   Hearing  Hearing: Within functional limits    Strength:    Strength: Generally decreased, functional  Tone & Sensation:   Tone: Normal  Sensation: Impaired (bilat feet)  Range Of Motion:  AROM: Generally decreased, functional     Functional Mobility:  Bed Mobility:  Bed Mobility Training  Bed Mobility Training: Yes  Interventions: Verbal cues;Manual cues  Rolling: Minimum assistance;Other (comment) (with bedrails)  Supine to Sit: Minimum assistance;Assist X2  Sit to Supine: Moderate assistance;Assist X2  Scooting: Maximum assistance;Assist X2  Transfers:  Art therapist: No; attempted to set up  transfer on Best Mover but patient too fatigued to attempt to stand, c/o R shoulder pain when reaching for bar on best mover  Balance:   Balance  Sitting: Impaired  Sitting - Static: Fair (occasional)  Sitting - Dynamic: Fair (occasional);Poor (constant support)  Standing:  (unable to assess)                                                                                                                                                                                    [employer] AM-PAC      Basic Mobility Inpatient Short Form (6-Clicks) Version 2    How much help is needed turning from your back to your side while in a flat bed without using bedrails?: A Lot  How much help is needed moving from lying on your back to sitting on the side of a flat bed without using bedrails?: A Lot  How much help is needed moving to and from a bed to a chair?: Total  How much help is needed standing up from a chair using your arms?: Total  How much help is needed walking in hospital room?: Total  How much help is needed climbing 3-5 steps with a railing?: Total    AM-PAC Inpatient Mobility Raw Score : 8  AM-PAC Inpatient T-Scale Score : 28.52     Cutoff score ?171,2,3 had higher odds of discharging home with home health or need of SNF/IPR.    1. Emelia Loron, Janeece Riggers, Vinoth Fransico Meadow, Lupe Carney Passek, Thornton Dales. Sherald Barge  Otho Ket.  Validity of the AM-PAC "6-Clicks" Inpatient Daily Activity and Basic Mobility Short Forms. Physical Therapy Mar 2014, 94 (3) 379-391; DOI: 10.2522/ptj.20130199  2. Venetia Night. Association of AM-PAC "6-Clicks" Basic Mobility and Daily Activity Scores With Discharge Destination. Phys Ther. 2021 Apr 4;101(4):pzab043. doi: 10.1093/ptj/pzab043. PMID: 60454098.  3. Herbold J, Rajaraman D, Lubertha Basque, Agayby K, [place] S. Activity Measure for Post-Acute Care "6-Clicks" Basic Mobility Scores Predict Discharge Destination After Acute Care Hospitalization in  Select Patient Groups: A Retrospective, Observational Study. Arch Rehabil Res Clin Transl. 2022 Jul 16;4(3):100204. doi: 10.1016/j.arrct.1191.478295. PMID: 62130865; PMCID: [id number].  4. Josefina Do, Coster W, Ni P. AM-PAC Short Forms Manual 4.0. Revised 12/2018.                                                                                                                                                                                                                          Pain Rating:  0/10     Activity Tolerance:   Poor, requires frequent rest breaks, observed shortness of breath on exertion, and SpO2 stable on room air    After treatment:   Patient left in no apparent distress in bed, Call bell within reach, Bed/ chair alarm activated, Side rails x3, and Patient offloaded in partial L side lying for pressure relief    COMMUNICATION/EDUCATION:   The patient's plan of care was discussed with: occupational therapist, registered nurse, and physician    Patient Education  Education Given To: Patient  Education Provided: Plan of Microbiologist;Fall Prevention Strategies  Education Method: Verbal  Barriers to Learning: None  Education Outcome: Verbalized understanding;Continued education needed    Thank you for this referral.  Emika Tiano, PT  Minutes: 34

## 2022-07-14 NOTE — Care Coordination-Inpatient (Addendum)
Transition of Care Plan:    RUR: 19%   Prior Level of Functioning: independent  [place] [facility] [place]- will need auth  If SNF or IPR: Date FOC offered: 06/30/22 LTACH and ongoing  Date FOC received:   Accepting facility: TBD  Date authorization started with reference number:   Date authorization received and expires:   Follow up appointments:   DME needed: per faciltiy  Transportation at discharge: TBD likely BLS  IM/IMM Medicare/Tricare letter given: will need prior to discharge  Is patient a Veteran and connected with [facility]?    If yes, was [employer] transfer form completed and [facility] notified?   Caregiver Contact: Nihaal Friesen - Wife - [phone]  Discharge Caregiver contacted prior to discharge? no  Care Conference needed?   Barriers to discharge: new HD, wound vac/wound care orders, placement and insurance authorization, transportation for OP HD     CM received call from [place] with [facility] - no HD bed available - spoke with Susie wife and CM sent listing of SNF for her area to review which facilities she would not want and can then explore which facilities may have OP HD in house email is [email or web address].  Will follow.  Ella Jubilee, MSW

## 2022-07-14 NOTE — Plan of Care (Signed)
Problem: Occupational Therapy - Adult  Goal: By Discharge: Performs self-care activities at highest level of function for planned discharge setting.  See evaluation for individualized goals.  Description: FUNCTIONAL STATUS PRIOR TO ADMISSION:     ADL Assistance: Independent, Ambulation Assistance: Independent, Transfer Assistance: Independent, Active Driver: Yes     HOME SUPPORT: Patient lived with spouse and was independent.    Occupational Therapy Goals:  Initiated 06/10/2022; Re-Evaluation 06/20/2022.  Weekly Reassessment 06/29/22 - Continue as below. Goal #6 added. Goals reviewed and revised PRN as per 9/7 weekly reevaluation.   1.  Patient will perform grooming at bed level with Moderate Assist within 7 day(s). Continue goal.  2.  Patient will perform self-feeding with Moderate Assist within 7 day(s). Continue goal.  3.  Patient will perform upper body dressing with Maximal Assist within 7 day(s). Continue goal.   4.  Patient will complete supine>sit with mod assist x2  within 7 day(s). Goal met, change to min A of 1.   5.  Patient will tolerate sitting EOB with fair sitting balance in preparation for ADLs within 7 day(s). Goal  met, change to good.  6.  Patient will participate in BUE therapeutic activity / exercise with Minimal Assist within 7 day(s). Goal met, change to min A.      Outcome: Progressing   OCCUPATIONAL THERAPY TREATMENT  Patient: Antino Mayabb (76 y.o. male)  Date: 07/14/2022  Primary Diagnosis: Septicemia ([place]) [A41.9]  Gastric perforation ([place]) [K25.5]  Perforated abdominal viscus [R19.8]  Procedure(s) (LRB):  LAPAROTOMY EXPLORATORY (N/A)  LAPAROSCOPY DIAGNOSTIC (N/A)  ENDOSCOPY OF ILEAL CONDUIT (N/A) 43 Days Post-Op   Precautions: Fall Risk                Chart, occupational therapy assessment, plan of care, and goals were reviewed.    ASSESSMENT  Patient continues to benefit from skilled OT services and is progressing towards goals. Patient is received resting in bed, amenable to session.  Patient continues to demonstrate improved sitting balance and activity tolerance, maintaining unsupported sitting at EOB for 15-20 minutes in prep for increased safety and independence in upright ADL engagement. Patient with some postural sway, however improved awareness and ability to initiate correction to midline. Although significant concentration and effort needed, patient maintains fair seated balance while extending each LE to facilitate donning socks. Patient agreeable to trial [facility] for sit<>stand, however ultimately too fatigued and endorsing B shoulder pain when hands in position on device. Continue to recommend rehab.           PLAN :  Patient continues to benefit from skilled intervention to address the above impairments.  Continue treatment per established plan of care to address goals.    Recommendation for discharge: (in order for the patient to meet his/her long term goals): Therapy 3 hours/day 5-7 days/week    Other factors to consider for discharge: patient's current support system is unable to meet their requirements for physical assistance, high risk for falls, not safe to be alone, and concern for safely navigating or managing the home environment    IF patient discharges home will need the following DME:  hospital bed; wheelchair; mechanical lift; TBD       SUBJECTIVE:   Patient stated "I think this and last time are going a bit better" in reference to sitting EOB.    OBJECTIVE DATA SUMMARY:   Cognitive/Behavioral Status:  Orientation  Overall Orientation Status: Within Functional Limits  Orientation Level: Oriented X4  Cognition  Overall  Cognitive Status: WFL  Attention Span: Attends with cues to redirect;Difficulty dividing attention  Insights: Decreased awareness of deficits  Initiation: Requires cues for some  Sequencing: Requires cues for some    Functional Mobility and Transfers for ADLs:  Bed Mobility:  Bed Mobility Training  Bed Mobility Training: Yes  Interventions: Verbal  cues;Manual cues  Rolling: Minimum assistance;Other (comment) (with bedrails)  Supine to Sit: Minimum assistance;Assist X2  Sit to Supine: Moderate assistance;Assist X2  Scooting: Maximum assistance;Assist X2     Transfers:   Pharmacologist: No      Balance:  Standing:  (unable to assess)  Balance  Sitting: Impaired  Sitting - Static: Fair (occasional)  Sitting - Dynamic: Fair (occasional);Poor (constant support)  Standing:  (unable to assess)      ADL Intervention & Therapeutic Activity:  Patient participates in EOB activity in prep for increased safety and independence in upright ADL engagement. Note emphasis on sitting balance, posture, and activity tolerance. Patient with some postural sway, increased with fatigue. Note improved awareness of positioning and patient initiating >75% of corrections needed. Patient progresses from UE support at sides to hands in lap.    LE Dressing: Dependent/Total  LE Dressing Skilled Clinical Factors: Patient participates in donning while seated EOB. Patient is able to extend each LE individually with significant concentration and effort needed to maintain trunk balance. Rest break between each.      Pain Rating:  In RUE (MD in room and aware)       Pain Intervention(s):   rest and repositioning      Activity Tolerance:   Fair  and requires frequent rest breaks  Please refer to the flowsheet for vital signs taken during this treatment.    After treatment:   Patient left in no apparent distress in bed, Call bell within reach, Bed/ chair alarm activated, Side rails x3, and Patient offloaded in partial L side lying for pressure relief    COMMUNICATION/EDUCATION:   The patient's plan of care was discussed with: physical therapist and registered nurse    Patient Education  Education Given To: Patient  Education Provided: Role of Therapy;Plan of Care;Energy Conservation  Education Method: Verbal;Demonstration  Education Outcome: Verbalized understanding    Thank  you for this referral.  Isabella Stalling, OT  Minutes: 35

## 2022-07-14 NOTE — Progress Notes (Signed)
Hospitalist Progress Note    NAME:   Jaime [name]   DOB: [birth year]   MRN: [id number]     Date/Time: 07/14/2022 3:04 PM  Patient PCP: No primary care provider on file.    Estimated discharge date:9/12  Barriers: need placement    For reference :   76 y/m with prolonged hospitalization, was admitted to surgical service on 7/31 for perforated hollow viscus underwent emergent laparotomy/ septic shock d/t peritonitis, was intubated and extubated x 2. Also had AKI needing CVVH, now on HD. Also has bilateral foot gangrene. Was transferred to hospitalist service on 8/21.      Assessment / Plan:  #Septic shock- resolved   D/t perforated viscus  S/p Laparotomy 7/31, with wound vac drainage   Liver abscess s/p IR drainage, hepatic drain to be remove !  Bacteremia   Final ID recommendation to continue:  -Ceftriaxone 2G   IV daily x 6 weeks end date 9/15  - Flagyl 500 mg po bid end date 9/8  - Pt will require repeat imaging of liver before conclusion of therapy  -Discussed with case management regarding discharge disposition - wife needs to set up financial assistance for transfer of patient to [place] . Other option could be to go to an SNF in [place] [facility] ) .    Continue ceftriaxone 2 g IV daily  Pt  for   6 weeks of coverage of  liver abscess planned end date 9/15.   Continue Flagyl given complicated history & persistent leucocytosis  Patient would require repeat imaging prior to DC of antimicrobials.  We will plan for repeat CT to next week if patient is here.      09/12 patient has no IV access Case was discussed with the ID,, start patient on cefdnir , normal CT scan was done and show collection of the left hepatic lobe appears stable  Questionable acute pancreatitis  We will check lipase level     #Acute hypoxemic respiratory failure:  Intubated 7/31, extubation 8/7  -Reintubated 8/13, extubated on 8/19  -Currently on 2 liters [place]  -Weaned off o2      #Dysphagia:  -Ongoing SLP evaluation-patient tolerating small diet .    -Aspiration on FEES study  -Follow-up SLP evaluation     #Acute kidney injury:  -Now on HD  -Continue with HD  -Nephrology following  -Case management informed the need to set up HD at SNF prior to discharge .  -Will follow up cmp .     #Bilateral lower extremity critical limb ischemia  Gangrene both feet  -Podiatry and vascular surgery following  Cleared by podiatry for demarcation+ autoamputation    #Diabetes mellitus:  -Sliding scale and lantus  -Monitor FS  -Diabetes team following     #Sacral wound:  -Wound care following       Medical Decision Making:   I personally reviewed labs:CBC and BMP   I personally reviewed imaging: na   I personally reviewed EKG: None  Toxic drug monitoring: Monitor creatinine with abx  Discussed case with: RN, patient     Code Status: Full  DVT Prophylaxis: heparin  GI Prophylaxis:none   Subjective:     Chief Complaint / Reason for Physician Visit  Labs and charts reviewed and patient examined .   No new issues, continue moving his arm and feeds the swelling in the upper extremity improving a lot patient doing good overall         Objective:  VITALS:   Last 24hrs VS reviewed since prior progress note. Most recent are:  Patient Vitals for the past 24 hrs:   BP Temp Temp src Pulse Resp SpO2   07/14/22 1232 (!) 150/83 97.7 F (36.5 C) Oral 87 16 94 %   07/14/22 0828 139/71 97.8 F (36.6 C) Oral 89 16 96 %   07/14/22 0509 132/77 97.7 F (36.5 C) Axillary 93 16 --   07/14/22 0014 (!) 145/68 98 F (36.7 C) Axillary 95 16 92 %   07/13/22 2022 (!) 154/75 97.1 F (36.2 C) Axillary 95 18 96 %   07/13/22 1505 (!) 127/49 97.6 F (36.4 C) Axillary 97 16 --           Intake/Output Summary (Last 24 hours) at 07/14/2022 1504  Last data filed at 07/13/2022 2022  Gross per 24 hour   Intake 160 ml   Output 45 ml   Net 115 ml          I had a face to face encounter and independently examined this patient on 07/14/2022, as outlined below:  PHYSICAL EXAM:  General:          Alert,  cooperative  EENT:              EOMI. Anicteric sclerae.Rt. hd catn - jugular   Resp:               CTA bilaterally, no wheezing or rales.  No accessory muscle use  CV:                  Regular  rhythm,  No edema, AS murmur   GI:                   Soft, Non distended,Mildly tender.  +Bowel sounds, wound vac in place , drain on rt upper quadrant - purulent material   Neurologic:       Alert and oriented X 3, normal speech,   Psych:   Good insight. Not anxious nor agitated  Skin:                No rashes.  No jaundice  Foot :  bilateral feet - gangrene of first second and third toes , not yet full demarcation           Reviewed most current lab test results and cultures  YES  Reviewed most current radiology test results   YES  Review and summation of old records today    NO  Reviewed patient's current orders and MAR    YES  PMH/SH reviewed - no change compared to H&P  ________________________________________________________________________  Care Plan discussed with:    Comments   Patient x    Family      RN x    Care Manager     Consultant                        Multidiciplinary team rounds were held today with case manager, nursing, pharmacist and Higher education careers adviser.  Patient's plan of care was discussed; medications were reviewed and discharge planning was addressed.     ________________________________________________________________________  Total NON critical care TIME:  52  Minutes    Total CRITICAL CARE TIME Spent:   Minutes non procedure based      Comments   >50% of visit spent in counseling and coordination of care     ________________________________________________________________________  Terence Lux, MD  Procedures: see electronic medical records for all procedures/Xrays and details which were not copied into this note but were reviewed prior to creation of Plan.      LABS:  I reviewed today's most current labs and imaging studies.  Pertinent labs include:  Recent Labs     07/13/22  0843   WBC 7.8    HGB 7.8*   HCT 25.7*   PLT 157       Recent Labs     07/12/22  0320 07/13/22  0319   NA 136 134*   K 3.8 3.9   CL 102 102   CO2 27 23   GLUCOSE 104* 107*   BUN 29* 36*   CREATININE 4.24* 4.86*   CALCIUM 8.4* 8.2*   MG  --  2.8*   PHOS  --  4.7   LABALBU  --  1.8*   BILITOT  --  0.9   AST  --  26   ALT  --  17         Signed: Terence Lux, MD

## 2022-07-14 NOTE — Progress Notes (Incomplete)
End of Shift Note    Bedside shift change report given to *** (oncoming nurse) by Jake Seats, RN (offgoing nurse).  Report included the following information SBAR, Intake/Output, MAR, Recent Results, and Cardiac Rhythm : normal sinus rhythm    Shift worked:  1900-0730     Shift summary and any significant changes:     Patient resting quietly this shift; no reports of pain/discomfort; no IV access at this time, MD aware; HD R subclavian cath capped, dressing c/d/i; all toes (R & L) blackened, swabbed w/ betadine, open to air; sacral pressure wound dressing c/d/i, turning q2h for pressure relief/comfort; abd wound with WoundVac dressing intact, draining minimal serous fluids; Q6h POCT glucose WNL, no action required     Concerns for physician to address:  No IV access, may need central line for IV ATBx     Zone phone for oncoming shift:  [phone]       Activity:     Number times ambulated in hallways past shift: 0  Number of times OOB to chair past shift: 0    Cardiac:   Cardiac Monitoring: Yes           Access:  Current line(s): HD access and no access     Genitourinary:   Urinary status: voiding, incontinent, and external catheter    Respiratory:      Chronic home O2 use?: NO  Incentive spirometer at bedside: NO       GI:     Current diet:  ADULT ORAL NUTRITION SUPPLEMENT; Breakfast, Lunch, Dinner; Renal Oral Supplement  DIET ONE TIME MESSAGE;  DIET ONE TIME MESSAGE;  ADULT DIET; Easy to Chew; 5 carb choices (75 gm/meal); Low Phosphorus (Less than 1000 mg)  Passing flatus: YES  Tolerating current diet: YES       Pain Management:   Patient states pain is manageable on current regimen: YES    Skin:     Interventions: specialty bed, float heels, foam dressing, PT/OT consult, internal/external urinary devices, and nutritional support    Patient Safety:  Fall Score:    Interventions: bed/chair alarm, assistive device (walker, cane. etc), gripper socks, and pt to call before getting OOB       Length of Stay:  Expected LOS:  47  Actual LOS: 43      Jake Seats, RN

## 2022-07-14 NOTE — Wound Image (Signed)
Wound Care consult for reassessment of the mid abdominal wound and the sacrum / buttock wounds. Pt. Is being transferred to an LTAC in [place] in a few days once it is approved. For now we will continue the same care with off loading the sacrum and buttocks to heal the pressure injury and to do the Wound Vac dressing change every 3 days by the wound care nurse.     Assessment and Treatment: The buttocks / coccyx wound is improving and can now be staged as a Stage 3 with minimal slough in the wound bed. There is a small amount (thin layer) of gray slough left in the middle of the right buttocks wound. See photos below of the wound. Bolded in red are wounds assessed today.   The toes are demarcating. Painted with Betadine solution and left open to air to dry. These toes will have to be addressed at a later date during Re-hab.  Several of the toes will need to be removed. For now we are floating the heels and keeping these wounds dry.   The Abdominal wound is healing nicely with 98% granulation tissue present in the wound. The rest is a thin layer in the middle of slough.    Negative Pressure Wound Therapy Abdomen (Active)   $ Standard NPWT <=50 sq cm PER TX $ Yes 07/10/22 1613   $ Standard NPWT >50 sq cm PER TX $ Yes 07/07/22 1232   Wound Type Surgical 07/14/22 0720   Unit Type traditional 07/14/22 0720   Dressing Type Black Foam 07/14/22 0720   Number of pieces used 1 07/13/22 0750   Number of pieces removed 2 07/10/22 2020   Cycle Continuous 07/13/22 2022   Target Pressure (mmHg) 125 07/13/22 2022   Irrigation Solution Vashe 06/27/22 1110   Instillation Volume  75 mL 06/27/22 1110   Soak Time  6 minutes 07/10/22 2020   Vac Frequency 4 hours 07/10/22 2020   Canister changed? No 07/13/22 2022   Dressing Status Clean, dry & intact 07/14/22 0720   Dressing Changed Changed/New 07/14/22 0720   Drainage Amount Small 07/14/22 0720   Drainage Description Thin;Serosanguinous;Pink 07/14/22 0720   Dressing Change Due  07/14/22 07/14/22 0720   Output (ml) 0 ml 07/13/22 0530   Wound Assessment Pale granulation tissue 07/10/22 2020   Peri-wound Assessment Intact 07/10/22 2020   Odor None 07/10/22 2020   Number of days: 21       Wound 06/15/22 Sacrum Posterior DTI, stage 2 / excoriation secondary to diarrhea (Active)   Wound Image   07/14/22 1111   Wound Etiology Pressure Stage 3 07/14/22 1111   Dressing Status New dressing applied 07/14/22 1111   Wound Cleansed Vashe 07/14/22 1111   Dressing/Treatment Pharmaceutical agent (see MAR) 07/14/22 1111   Offloading for Diabetic Foot Ulcers Offloading ordered 07/14/22 1111   Dressing Change Due 07/15/22 07/14/22 1111   Wound Length (cm) 9.8 cm 07/14/22 1111   Wound Width (cm) 3.9 cm 07/14/22 1111   Wound Depth (cm) 0.4 cm 07/14/22 1111   Wound Surface Area (cm^2) 38.22 cm^2 07/14/22 1111   Wound Volume (cm^3) 15.288 cm^3 07/14/22 1111   Wound Assessment Granulation tissue;Slough 07/14/22 1111   Drainage Amount Scant (moist but unmeasurable) 07/14/22 1111   Drainage Description Serosanguinous 07/14/22 1111   Odor None 07/14/22 1111   Peri-wound Assessment Intact 07/14/22 1111   Margins Defined edges;Epibole (rolled edges) 07/14/22 1111   Wound Thickness Description not for Pressure Injury  Full thickness 07/14/22 1111   Number of days: 28       Wound 06/19/22 Toe (Comment  which one) Right (Active)   Wound Image   07/05/22 0835   Wound Etiology Arterial 07/14/22 0720   Dressing Status Clean;Dry;Intact 07/14/22 0720   Wound Cleansed Not Cleansed 07/14/22 0720   Dressing/Treatment Open to air 07/14/22 0720   Offloading for Diabetic Foot Ulcers Offloading ordered 07/14/22 0720   Dressing Change Due 07/12/22 07/11/22 0728   Wound Assessment Eschar dry 07/11/22 0728   Drainage Amount None (dry) 07/13/22 2022   Drainage Description Serosanguinous 07/11/22 0728   Odor None 07/13/22 2022   Peri-wound Assessment Ecchymosis;Edematous 07/13/22 2022   Margins Defined edges 07/13/22 2022   Wound Thickness  Description not for Pressure Injury Full thickness 07/11/22 0728   Number of days: 25       Wound 06/19/22 Abdomen Mid Surgical site dehisced on 06/18/22  Now Wound VAC (Active)   Wound Image   07/14/22 1111   Wound Etiology Surgical 07/14/22 1111   Dressing Status New dressing applied 07/14/22 1111   Wound Cleansed Vashe 07/14/22 1111   Dressing/Treatment Negative pressure wound therapy 07/14/22 1111   Offloading for Diabetic Foot Ulcers Offloading ordered 07/14/22 1111   Dressing Change Due 07/17/22 07/14/22 1111   Wound Length (cm) 20.9 cm 07/14/22 1111   Wound Width (cm) 2.8 cm 07/14/22 1111   Wound Depth (cm) 0.8 cm 07/14/22 1111   Wound Surface Area (cm^2) 58.52 cm^2 07/14/22 1111   Change in Wound Size % (l*w) 30.33 07/14/22 1111   Wound Volume (cm^3) 46.816 cm^3 07/14/22 1111   Wound Healing % 84 07/14/22 1111   Wound Assessment Pink/red;Pale granulation tissue;Slough 07/10/22 2020   Drainage Amount Small (< 25%) 07/14/22 1111   Drainage Description Serous 07/14/22 1111   Odor None 07/14/22 1111   Peri-wound Assessment Intact 07/14/22 1111   Margins Defined edges 07/14/22 1111   Wound Thickness Description not for Pressure Injury Full thickness 07/14/22 1111   Number of days: 25       Wound 06/19/22 Toe (Comment  which one) Left Black toes / necrotic. (Active)   Wound Image   07/05/22 0835   Wound Etiology Arterial 07/14/22 0720   Dressing Status Clean;Dry 07/14/22 0720   Wound Cleansed Betadine/povidone iodine 07/14/22 0720   Dressing/Treatment Open to air 07/14/22 0720   Offloading for Diabetic Foot Ulcers Offloading ordered 07/13/22 2022   Dressing Change Due 07/05/22 07/11/22 0728   Wound Assessment Eschar dry 07/13/22 2022   Drainage Amount None (dry) 07/13/22 2022   Odor None 07/13/22 2022   Peri-wound Assessment Edematous;Fragile;Hyperpigmented;Ecchymosis 07/13/22 2022   Margins Defined edges 07/13/22 2022   Wound Thickness Description not for Pressure Injury Full thickness 07/13/22 2022   Number of  days: 25      Treatment: the wound care to the coccyx / buttocks was done as it is ordered today.   The Abdominal wound was cleansed with Vashe and the Wound Vac dressing applied occlusively. The pressure in still on 125 mmHg and the dressing drew down without problems.   The toes were painted with the Betadine and left open to air.    Pt. Has a new drainage bag for the liver drain.   Plan: Continue the same plan for now. Pt. Really needs more aggressive PT and OT which he will receive in Re-hab at the [facility] in [place]. Pt. Is usually ambulatory prior to hospitalization.  Clyda Greener, RN, BSN, [employer]

## 2022-07-14 NOTE — Progress Notes (Signed)
End of Shift Note    Bedside shift change report given to Lance Coon, Charity fundraiser (oncoming nurse) by Kendrick Ranch, RN (offgoing nurse).  Report included the following information SBAR, Intake/Output, MAR, Recent Results, and Cardiac Rhythm SR    Shift worked:  0700-1900     Shift summary and any significant changes:     Pt worked with PT/OT today and sat on the edge of the bed. Wound care completed. HD dressing changed. No significant changes. Hopefully will get placement at an LTAC in [place] this week.      Concerns for physician to address:       Zone phone for oncoming shift:          Activity:     Number times ambulated in hallways past shift: 0  Number of times OOB to chair past shift: 0    Cardiac:   Cardiac Monitoring: Yes           Access:  Current line(s): HD access     Genitourinary:   Urinary status: voiding and incontinent    Respiratory:      Chronic home O2 use?: NO  Incentive spirometer at bedside: NO       GI:     Current diet:  ADULT ORAL NUTRITION SUPPLEMENT; Breakfast, Lunch, Dinner; Renal Oral Supplement  DIET ONE TIME MESSAGE;  DIET ONE TIME MESSAGE;  ADULT DIET; Easy to Chew; 5 carb choices (75 gm/meal); Low Phosphorus (Less than 1000 mg)  Passing flatus: YES  Tolerating current diet: YES       Pain Management:   Patient states pain is manageable on current regimen: N/A    Skin:     Interventions: specialty bed, float heels, increase time out of bed, foam dressing, PT/OT consult, internal/external urinary devices, and nutritional support    Patient Safety:  Fall Score:    Interventions: bed/chair alarm, assistive device (walker, cane. etc), gripper socks, pt to call before getting OOB, and stay with me (per policy)       Length of Stay:  Expected LOS: 47  Actual LOS: [address]      Kendrick Ranch, RN

## 2022-07-14 NOTE — Progress Notes (Signed)
Bedside shift change report given to Elpidio Galea, RN (oncoming nurse) by Joycelyn Schmid, RN (offgoing nurse). Report included the following information Nurse Handoff Report, Index, ED Encounter Summary, ED SBAR, Adult Overview, Surgery Report, Intake/Output, MAR, Recent Results, Med Rec Status, Cardiac Rhythm NSR, and Alarm Parameters.      Braden Score 12. All of the following interventions have been implemented to prevent pressure injury:    SKIN ASSESSMENT (S)  Dual skin assessment completed at shift change: Yes  Name of second RN who completed Dual Skin Assessment: Lexi, RN  Picture of wound uploaded to EMR: Yes  Venelex ordered and given per protocol: Yes  Wound care consulted if wounds present: Yes    SURFACE (S)  St[employer]ker air pump or specialty bed ordered: Yes  Type of bed:   Only white chux used with specialty surfaces (no green chux used): Yes  Waffle cushion used for chair positioning: Yes    KEEP MOVING (K)  Mobility status (Bedrest, Chairbound, UWA x 1 assist , 2 assist, Max assist): 2  Q2 hour turns documented: Yes  Refusals to turn and education provided documented: Yes  Device used to float heels: Blue cushion for floating heels  PT/OT consulted: Yes    INCONTINENCE (I)  Incontinence status assessed Q2 hours: Yes  External catheter in use: Yes  Barrier cream in use: Yes  NUTRITION (N)  I/O's documented every 8 hours: Yes  Oral supplements ordered if appropriate: Yes  Nutrition services consulted: Yes    All concerns about new DTI's must be escalated directly to attending MD, charge nurse, CCL and nurse director.

## 2022-07-14 NOTE — Progress Notes (Signed)
2145 - Pt. Refuse BG check at this time and prefer BG check at midnight.

## 2022-07-15 LAB — CBC WITH AUTO DIFFERENTIAL
Absolute Immature Granulocyte: 0 10*3/uL (ref 0.00–0.04)
Band Neutrophils: 2 %
Basophils %: 2 % — ABNORMAL HIGH (ref 0–1)
Basophils Absolute: 0.2 10*3/uL — ABNORMAL HIGH (ref 0.0–0.1)
Eosinophils %: 7 % (ref 0–7)
Eosinophils Absolute: 0.6 10*3/uL — ABNORMAL HIGH (ref 0.0–0.4)
Hematocrit: 26 % — ABNORMAL LOW (ref 36.6–50.3)
Hemoglobin: 8.1 g/dL — ABNORMAL LOW (ref 12.1–17.0)
Immature Granulocytes: 0 % (ref 0.0–0.5)
Lymphocytes %: 21 % (ref 12–49)
Lymphocytes Absolute: 1.7 10*3/uL (ref 0.8–3.5)
MCH: 32.1 PG (ref 26.0–34.0)
MCHC: 31.2 g/dL (ref 30.0–36.5)
MCV: 103.2 FL — ABNORMAL HIGH (ref 80.0–99.0)
MPV: 10.4 FL (ref 8.9–12.9)
Metamyelocytes: 1 %
Monocytes %: 8 % (ref 5–13)
Monocytes Absolute: 0.6 10*3/uL (ref 0.0–1.0)
Neutrophils %: 59 % (ref 32–75)
Neutrophils Absolute: 4.9 10*3/uL (ref 1.8–8.0)
Nucleated RBCs: 0 PER 100 WBC
Platelets: 153 10*3/uL (ref 150–400)
RBC: 2.52 M/uL — ABNORMAL LOW (ref 4.10–5.70)
RDW: 16.5 % — ABNORMAL HIGH (ref 11.5–14.5)
WBC: 8.1 10*3/uL (ref 4.1–11.1)
nRBC: 0 10*3/uL (ref 0.00–0.01)

## 2022-07-15 LAB — MAGNESIUM: Magnesium: 2.8 mg/dL — ABNORMAL HIGH (ref 1.6–2.4)

## 2022-07-15 LAB — POCT GLUCOSE
POC Glucose: 112 mg/dL (ref 65–117)
POC Glucose: 104 mg/dL (ref 65–117)
POC Glucose: 77 mg/dL (ref 65–117)

## 2022-07-15 LAB — RENAL FUNCTION PANEL
Albumin: 2.1 g/dL — ABNORMAL LOW (ref 3.5–5.0)
Anion Gap: 4 mmol/L — ABNORMAL LOW (ref 5–15)
BUN: 26 MG/DL — ABNORMAL HIGH (ref 6–20)
Bun/Cre Ratio: 6 — ABNORMAL LOW (ref 12–20)
CO2: 29 mmol/L (ref 21–32)
Calcium: 8.2 MG/DL — ABNORMAL LOW (ref 8.5–10.1)
Chloride: 100 mmol/L (ref 97–108)
Creatinine: 4.26 MG/DL — ABNORMAL HIGH (ref 0.70–1.30)
Est, Glom Filt Rate: 14 mL/min/{1.73_m2} — ABNORMAL LOW (ref >60)
Glucose: 93 mg/dL (ref 65–100)
Phosphorus: 4 MG/DL (ref 2.6–4.7)
Potassium: 4 mmol/L (ref 3.5–5.1)
Sodium: 133 mmol/L — ABNORMAL LOW (ref 136–145)

## 2022-07-15 LAB — PHOSPHORUS: Phosphorus: 4.2 MG/DL (ref 2.6–4.7)

## 2022-07-15 MED ORDER — DIPHENHYDRAMINE HCL 25 MG PO CAPS
25 | Freq: Four times a day (QID) | ORAL | Status: DC | PRN
Start: 2022-07-15 — End: 2022-08-03
  Administered 2022-07-15 – 2022-07-18 (×2): 25 mg via ORAL

## 2022-07-15 MED ORDER — HEPARIN SODIUM (PORCINE) 1000 UNIT/ML IJ SOLN
1000 UNIT/ML | INTRAMUSCULAR | Status: AC
Start: 2022-07-15 — End: 2022-07-15

## 2022-07-15 MED FILL — THERA M PLUS PO TABS: ORAL | Qty: 1

## 2022-07-15 MED FILL — HEPARIN SODIUM (PORCINE) 5000 UNIT/ML IJ SOLN: 5000 UNIT/ML | INTRAMUSCULAR | Qty: 1

## 2022-07-15 MED FILL — RENVELA 2.4 G PO PACK: 2.4 g | ORAL | Qty: 2.4

## 2022-07-15 MED FILL — HEPARIN SODIUM (PORCINE) 1000 UNIT/ML IJ SOLN: 1000 UNIT/ML | INTRAMUSCULAR | Qty: 10

## 2022-07-15 MED FILL — AMIODARONE HCL 200 MG PO TABS: 200 MG | ORAL | Qty: 1

## 2022-07-15 MED FILL — RISAQUAD-2 PO CAPS: ORAL | Qty: 1

## 2022-07-15 MED FILL — CEFDINIR 300 MG PO CAPS: 300 MG | ORAL | Qty: 1

## 2022-07-15 MED FILL — RETACRIT 3000 UNIT/ML IJ SOLN: 3000 UNIT/ML | INTRAMUSCULAR | Qty: 2

## 2022-07-15 MED FILL — DIPHENHYDRAMINE HCL 25 MG PO CAPS: 25 MG | ORAL | Qty: 1

## 2022-07-15 MED FILL — SEVELAMER CARBONATE 2.4 G PO PACK: 2.4 g | ORAL | Qty: 2.4

## 2022-07-15 NOTE — Progress Notes (Incomplete)
Hospitalist Progress Note    NAME:   Jaime [name]   DOB: 06-13-1946   MRN: [id number]     Date/Time: 07/15/2022 2:47 PM  Patient PCP: No primary care provider on file.    Estimated discharge date:9/12  Barriers: need placement    For reference :   76 y/m with prolonged hospitalization, was admitted to surgical service on 7/31 for perforated hollow viscus underwent emergent laparotomy/ septic shock d/t peritonitis, was intubated and extubated x 2. Also had AKI needing CVVH, now on HD. Also has bilateral foot gangrene. Was transferred to hospitalist service on 8/21.      Assessment / Plan:  #Septic shock- resolved   D/t perforated viscus  S/p Laparotomy 7/31, with wound vac drainage   Liver abscess s/p IR drainage, hepatic drain to be remove !  Bacteremia   Final ID recommendation to continue:  -Ceftriaxone 2G   IV daily x 6 weeks end date 9/15  - Flagyl 500 mg po bid end date 9/8  - Pt will require repeat imaging of liver before conclusion of therapy  -Discussed with case management regarding discharge disposition - wife needs to set up financial assistance for transfer of patient to [place] . Other option could be to go to an SNF in [place] [facility] ) .    Continue ceftriaxone 2 g IV daily  Pt  for   6 weeks of coverage of  liver abscess planned end date 9/15.   Continue Flagyl given complicated history & persistent leucocytosis  Patient would require repeat imaging prior to DC of antimicrobials.  We will plan for repeat CT to next week if patient is here.      09/12 patient has no IV access Case was discussed with the ID,, start patient on cefdnir , normal CT scan was done and show collection of the left hepatic lobe appears stable  Questionable acute pancreatitis  Lipase was ordered yesterday not done will be ordered today    09/13  IR reconsulted for evaluation of hepatic drain  Patient has no IV access antibiotic was changed to cefdinir    #Acute hypoxemic respiratory failure:  Intubated 7/31, extubation  8/7  -Reintubated 8/13, extubated on 8/19  -Currently on 2 liters [place]  -Weaned off o2      #Dysphagia:  -Ongoing SLP evaluation-patient tolerating small diet .   -Aspiration on FEES study  -Follow-up SLP evaluation     #Acute kidney injury:  -Now on HD  -Continue with HD  -Nephrology following  -Case management informed the need to set up HD at SNF prior to discharge .  -Will follow up cmp .     #Bilateral lower extremity critical limb ischemia  Gangrene both feet  -Podiatry and vascular surgery following  Cleared by podiatry for demarcation+ autoamputation    #Diabetes mellitus:  -Sliding scale and lantus  -Monitor FS  -Diabetes team following     #Sacral wound:  -Wound care following       Medical Decision Making:   I personally reviewed labs:CBC and BMP   I personally reviewed imaging: na   I personally reviewed EKG: None  Toxic drug monitoring: Monitor creatinine with abx  Discussed case with: RN, patient     Code Status: Full  DVT Prophylaxis: heparin  GI Prophylaxis:none   Subjective:     Chief Complaint / Reason for Physician Visit  Labs and charts reviewed and patient examined .   No new issues, continue moving his arm and  feeds the swelling in the upper extremity improving a lot patient doing good overall         Objective:     VITALS:   Last 24hrs VS reviewed since prior progress note. Most recent are:  Patient Vitals for the past 24 hrs:   BP Temp Temp src Pulse Resp SpO2 Height Weight   07/15/22 1300 (!) 142/70 -- -- 97 -- -- -- --   07/15/22 1113 134/66 (!) 67.4 F (19.7 C) -- 93 18 -- -- --   07/15/22 1100 128/66 -- -- 89 -- -- -- --   07/15/22 1045 131/67 -- -- 88 -- -- -- --   07/15/22 1030 131/64 -- -- 89 -- -- -- --   07/15/22 1015 126/65 -- -- 86 -- -- -- --   07/15/22 1000 (!) 121/58 -- -- 87 -- -- -- --   07/15/22 0945 129/66 -- -- 86 -- -- -- --   07/15/22 0930 128/70 -- -- 84 -- -- -- --   07/15/22 0915 123/65 -- -- 89 -- -- -- --   07/15/22 0900 117/63 -- -- 91 -- -- -- --   07/15/22 0845 (!)  107/53 -- -- 93 -- -- -- --   07/15/22 0830 126/69 97.6 F (36.4 C) -- 86 -- 95 % -- --   07/15/22 0813 (!) 146/74 98.6 F (37 C) -- 82 18 -- -- --   07/15/22 0715 136/72 98.7 F (37.1 C) Oral 87 10 95 % -- --   07/15/22 0320 (!) 151/68 98.7 F (37.1 C) -- 86 17 95 % 1.727 m (5\' 8" ) 97 kg (213 lb 13.5 oz)   07/14/22 2345 (!) 142/69 98.9 F (37.2 C) Oral 87 16 95 % -- --   07/14/22 1941 (!) 141/70 98.1 F (36.7 C) Axillary 93 18 96 % -- --   07/14/22 1545 (!) 128/97 98.5 F (36.9 C) Oral 99 18 95 % -- --           Intake/Output Summary (Last 24 hours) at 07/15/2022 1447  Last data filed at 07/15/2022 1113  Gross per 24 hour   Intake 600 ml   Output 1450 ml   Net -850 ml          I had a face to face encounter and independently examined this patient on 07/15/2022, as outlined below:  PHYSICAL EXAM:  General:          Alert, cooperative  EENT:              EOMI. Anicteric sclerae.Rt. hd catn - jugular   Resp:               CTA bilaterally, no wheezing or rales.  No accessory muscle use  CV:                  Regular  rhythm,  No edema, AS murmur   GI:                   Soft, Non distended,Mildly tender.  +Bowel sounds, wound vac in place , drain on rt upper quadrant - purulent material   Neurologic:       Alert and oriented X 3, normal speech,   Psych:   Good insight. Not anxious nor agitated  Skin:                No rashes.  No jaundice  Foot :  bilateral feet - gangrene of first  second and third toes , not yet full demarcation           Reviewed most current lab test results and cultures  YES  Reviewed most current radiology test results   YES  Review and summation of old records today    NO  Reviewed patient's current orders and MAR    YES  PMH/SH reviewed - no change compared to H&P  ________________________________________________________________________  Care Plan discussed with:    Comments   Patient x    Family      RN x    Care Manager     Consultant                        Multidiciplinary team rounds were  held today with case manager, nursing, pharmacist and Higher education careers adviser.  Patient's plan of care was discussed; medications were reviewed and discharge planning was addressed.     ________________________________________________________________________  Total NON critical care TIME:  52  Minutes    Total CRITICAL CARE TIME Spent:   Minutes non procedure based      Comments   >50% of visit spent in counseling and coordination of care     ________________________________________________________________________  Terence Lux, MD     Procedures: see electronic medical records for all procedures/Xrays and details which were not copied into this note but were reviewed prior to creation of Plan.      LABS:  I reviewed today's most current labs and imaging studies.  Pertinent labs include:  Recent Labs     07/13/22  0843 07/15/22  0805   WBC 7.8 8.1   HGB 7.8* 8.1*   HCT 25.7* 26.0*   PLT 157 153       Recent Labs     07/13/22  0319 07/15/22  0805   NA 134* 133*   K 3.9 4.0   CL 102 100   CO2 23 29   GLUCOSE 107* 93   BUN 36* 26*   CREATININE 4.86* 4.26*   CALCIUM 8.2* 8.2*   MG 2.8* 2.8*   PHOS 4.7 4.2  4.0   LABALBU 1.8* 2.1*   BILITOT 0.9  --    AST 26  --    ALT 17  --          Signed: Terence Lux, MD

## 2022-07-15 NOTE — Plan of Care (Signed)
Problem: Occupational Therapy - Adult  Goal: By Discharge: Performs self-care activities at highest level of function for planned discharge setting.  See evaluation for individualized goals.  Description: FUNCTIONAL STATUS PRIOR TO ADMISSION:     ADL Assistance: Independent, Ambulation Assistance: Independent, Transfer Assistance: Independent, Active Driver: Yes     HOME SUPPORT: Patient lived with spouse and was independent.    Occupational Therapy Goals:  Initiated 06/10/2022; Re-Evaluation 06/20/2022.  Weekly Reassessment 06/29/22 - Continue as below. Goal #6 added. Goals reviewed and revised PRN as per 9/7 weekly reevaluation.   1.  Patient will perform grooming at bed level with Moderate Assist within 7 day(s). Continue goal.  2.  Patient will perform self-feeding with Moderate Assist within 7 day(s). Continue goal.  3.  Patient will perform upper body dressing with Maximal Assist within 7 day(s). Continue goal.   4.  Patient will complete supine>sit with mod assist x2  within 7 day(s). Goal met, change to min A of 1.   5.  Patient will tolerate sitting EOB with fair sitting balance in preparation for ADLs within 7 day(s). Goal  met, change to good.  6.  Patient will participate in BUE therapeutic activity / exercise with Minimal Assist within 7 day(s). Goal met, change to min A.      Outcome: Progressing     Problem: Occupational Therapy - Adult  Goal: By Discharge: Performs self-care activities at highest level of function for planned discharge setting.  See evaluation for individualized goals.  Description: FUNCTIONAL STATUS PRIOR TO ADMISSION:     ADL Assistance: Independent, Ambulation Assistance: Independent, Transfer Assistance: Independent, Active Driver: Yes     HOME SUPPORT: Patient lived with spouse and was independent.    Occupational Therapy Goals:  Initiated 06/10/2022; Re-Evaluation 06/20/2022.  Weekly Reassessment 06/29/22 - Continue as below. Goal #6 added. Goals reviewed and revised PRN as per 9/7  weekly reevaluation.   1.  Patient will perform grooming at bed level with Moderate Assist within 7 day(s). Continue goal.  2.  Patient will perform self-feeding with Moderate Assist within 7 day(s). Continue goal.  3.  Patient will perform upper body dressing with Maximal Assist within 7 day(s). Continue goal.   4.  Patient will complete supine>sit with mod assist x2  within 7 day(s). Goal met, change to min A of 1.   5.  Patient will tolerate sitting EOB with fair sitting balance in preparation for ADLs within 7 day(s). Goal  met, change to good.  6.  Patient will participate in BUE therapeutic activity / exercise with Minimal Assist within 7 day(s). Goal met, change to S distally and min A proximally within 7 days.      07/15/2022 1625 by [name], OT  Outcome: Progressing  OCCUPATIONAL THERAPY TREATMENT  Patient: Jaime [name] (76 y.o. male)  Date: 07/15/2022  Primary Diagnosis: Septicemia ([place]) [A41.9]  Gastric perforation ([place]) [K25.5]  Perforated abdominal viscus [R19.8]  Procedure(s) (LRB):  LAPAROTOMY EXPLORATORY (N/A)  LAPAROSCOPY DIAGNOSTIC (N/A)  ENDOSCOPY OF ILEAL CONDUIT (N/A) 44 Days Post-Op   Precautions: Fall Risk, General Precautions (resp failure 05-31-22; skin- pressure wounds sacral/buttocks, necrosis B feet, Impaired B shoulder PROM/general debility; drain R side)                Chart, occupational therapy assessment, plan of care, and goals were reviewed.    ASSESSMENT  Patient continues to benefit from skilled OT services and is slowly progressing towards goals. Good participation; able to consistently follow  familiar 1 step commands, cues for unfamiliar commands and multi-step commands, cont to recommend cog screening, Moved to R side of bed today- more difficult than moving to L side of bed due to B shoulder weakness/core weakness; overall bed mobility much improved; willing to attemtp B UE shoulder exercises with decreased shoulder pain reported by end of session which will allow  improved self care skills; instructed patient in need to increase self initiated UE activity during down time to maximize progress both here and once he is transferred to rehab             PLAN :  Patient continues to benefit from skilled intervention to address the above impairments.  Continue treatment per established plan of care to address goals.    Recommend with staff: Harrel Lemon to chair 1x/day ; full assist set up for meals with upright positioning assist    Recommend next OT session: cog screen, AAROM B UE, don shirt edge of bed or high fowlers    Recommendation for discharge: (in order for the patient to meet his/her long term goals): Therapy 3 hours/day 5-7 days/week    Other factors to consider for discharge: available support system works or is unable to provide adequate supervision and the patient would be alone, patient's current support system is unable to meet their requirements for physical assistance, impaired cognition, high risk for falls, and not safe to be alone    IF patient discharges home will need the following DME: hospital bed, mechanical lift, wheelchair 18 inch, and heavy duty drop arm Bedside commode       SUBJECTIVE:   Patient stated "My memory is not too good."    OBJECTIVE DATA SUMMARY:   Cognitive/Behavioral Status:  Orientation  Overall Orientation Status: Within Functional Limits  Orientation Level: Oriented to person;Oriented to situation;Oriented to place  Cognition  Overall Cognitive Status: WFL  Arousal/Alertness: Appropriate responses to stimuli  Following Commands: Follows one step commands with increased time  Attention Span: Difficulty dividing attention  Memory: Decreased recall of precautions;Decreased short term memory (patient reports "My memory is not too good")  Safety Judgement: Good awareness of safety precautions (improving; learning about his multiple dx; training need to direct staff for pressure relief, skin care, drain care etc; improvement demonstrated)  Problem  Solving: Decreased awareness of errors;Assistance required to correct errors made;Assistance required to generate solutions (improving as task attention improves)  Insights: Decreased awareness of deficits  Initiation: Requires cues for some  Sequencing: Requires cues for some  Cognition Comment: recommend cog screen; patient reports "My memory not too good now."    Functional Mobility and Transfers for ADLs:  Bed Mobility:  Bed Mobility Training  Bed Mobility Training: Yes  Rolling: Minimum assistance;Additional time;Adaptive equipment;Assist X1 (training patient in use of bed rail for B UE AAROM in sidelying to maximize tolerance for AROM and strength for ADL tasks as well as use of UEs in sit to stand efforts with best mover)  Supine to Sit: Maximum assistance;Assist X2;Additional time (moved from supine to sit on R side of bed; R shoulder more painful (5-6/10) and much less able to use R UE for effective assist supine to sit effort than with L LE (supine to sit with min/mo A x 2 A.07-12-22))  Sit to Supine: Maximum assistance;Assist X2;Additional time  Scooting: Total assistance     Transfers:   Biomedical engineer Training: Yes  Sit to Stand: Total assistance;Additional time;Assist X2;Adaptive equipment (-3; best mover in place  with bed raised for assist; much improved tolerance for B UEs un device after AAROM in B sidelying prior to attempt to place UEs on best mover; unable to clear buttocks with A x 3 and B UEs of patient on best mover)  Bed to Chair:  (not yet tolerated with best mover- recommended up to chair with hoyer at least 1 x /day)      Balance:     Balance  Sitting: Impaired  Sitting - Static: Fair (occasional);Supported sitting (fearful of falling at times)  Sitting - Dynamic: Fair (occasional);Poor (constant support) (not effectively functional with reaching, scooting etc.)      ADL Intervention:         Feeding: Setup;Minimal assistance   Feeding Skilled Clinical Factors: needs meal  reassessment to make current recommedations with his improved strength     Grooming: Minimal assistance   Grooming Skilled Clinical Factors: A with hair; using beard- "its not usually this long"  (patient here since 7-30- needs hair and beard trim, nails trimmed etc)                       UE Dressing Skilled Clinical Factors: shoulders less painful by end of session and less painful today compared to yesterday; recommend AAROM in sidelying and increased activity in all directions/positions to maximize potential for UE skills to improve with ADLs both UE and LE               Toileting Skilled Clinical Factors: much improved rolling/bed mobility for bed pan placement; able to bend B knees: working towards bridging skills    Pain Rating:  2/10 L shoulder, 3/10 R shoulder  (5-6/10 R shoulder prior to tx session)  Pain Intervention(s):   nursing notified, rest, elevation, and repositioning      Activity Tolerance:   Fair , requires frequent rest breaks, and SpO2 stable on room air  Please refer to the flowsheet for vital signs taken during this treatment.    After treatment:   Patient left in no apparent distress in bed, Call bell within reach, and Side rails x3    COMMUNICATION/EDUCATION:   The patient's plan of care was discussed with: physical therapist, registered nurse, rehabilitation technician, and certified nursing assistant/patient care technician    Patient Education  Education Given To: Patient  Education Provided: Precautions;Fall Prevention Strategies;Plan of Care;ADL Adaptive Strategies;Equipment;Transfer Training;Home Exercise Program  Education Provided Comments: pressure relief, pain management  Education Method: Demonstration;Verbal;Teach Back  Barriers to Learning: Cognition  Education Outcome: Verbalized understanding;Demonstrated understanding;Continued education needed    Thank you for this referral.  Jean Rosenthal, OTR/L  Minutes: 42

## 2022-07-15 NOTE — Progress Notes (Incomplete)
Infectious Disease Progress    Impression      Hepatic abscess  Gas and fluid containing collection 7.1 x 10.8 x 5.4 cm  In anterior left lobe of liver, patchy areas of hypodensity right lobe  4 x 4.5 x 5.5 cm collection  S/p drainage by IR 8/4.Cultures + for  few E.coli  Repeat CT 8/7 showed reduction in size of hepatic abscess  Plan was for ceftriaxone IV x6 weeks end date 9/15, Flagyl  Until 8/18, now continued due to persistent leukocytosis  CT 8/13 + for    S/p percutaneous drainage of left hepatic abscess with  Minimal fluid remaining around catheter, suggestion of peripancreatic edema, small bilateral pleural effusions  Repeat CT abdomen pelvis 8/16+ for increased peripancreatic edema suspicious for pancreatitis, small residual fluid collection in left hepatic lobe  Lipase 737  S/p change of bag by IR .  Output less 30cc/ 24 h  Abdomen/pelvis 9/12+ for collection 5.8 x 3.3 cm  Previously 5 x 3 cm.  Peripancreatic fatty infiltration & stranding again noted.  Patient changed to [facility] today 2ry to lack of IV access      Sepsis  Septic shock.  Resolved    E. coli bacteremia  Blood cultures 7/31+ for E. coli   2/2 LAC (pan sensitive)   Negative repeat cultures 8/4   Treated.    Acute abdomen  Pneumoperitoneum  S/p diagnostic laparoscopy, laparotomy  EGD 7/31  Findings of cloudy fluid in right upper quadrant, yellow/white  Inflammatory peel over lesser curve of stomach, inferior lobe of liver  No gross perforation identified  Pancreas and retroperitoneum appeared edematous  Intra-Op fluid culture + for E. Coli (pansensitive)    S/p abdominal wound dehiscence  Clean, no purulence per wound care  Cultures 8/17, 8/18-NG.    Leukocytosis   Now resolved wbc 7.8      Gangrene, discoloration of toes  Persist      Acute hypoxic respiratory failure  Re intubated 8/13, extubated 8/19  Initially intubated 7/31, s/p extubation 8/7  CXR  8/20 + + for atelectasis  Resp cultures  8/13+ for light yeast  Apparent C.albicans/  dubliensis        AKI  Cr 4.86 on HD    Coagulopathy  Improving    Thrombocytopenia  resolved      Transaminitis  Improving    Hyperbilirubinemia   Improved      Diabetes type 2  Hyperglycemia  A1c 7.7    Diarrhea  resolved    Obesity  BMI 34.76  Plan  Pt was on ceftriaxone 2 g IV daily   Total antibiotic coverage 6 weeks ending 9/15  Currently on Omnicef p.o. due to lack of IV access.  We will plan to continue Omnicef p.o. x2 more weeks  ending 8 weeks on 9/ 29.    Antimicrobial orders for discharge  -Omnicef 300 mg p.o. daily end date 9/29  -- Pt will require repeat imaging of liver before conclusion of therapy  -Weekly CBC, CMP-  -Encourage adequate fluids, daily probiotic/yogurt  -ID follow-up - if pt remains in [place] call [phone]   For follow up.    Possible adverse effects of long term antibiotics are inclusive of but not limited to following  BM suppression, neutropenia , cytopenias , aplastic anemia hemorrhage liver & renal dysfunction/ liver , renal failure  , GI dysfunction- N, V  Diarrhea,C.difficile disease, rash , allergy , anaphylaxis.toxic epidermal necrolysis  Neuro toxicity , seizure  disorder  Side effects tend to be more pronounced in the elderly  Pt to report persistent symptoms such as N,V , diarrhea, numbness tingling of extremities  or any other symptoms      May DC from ID standpoint        Extensive review of chart notes, labs, imaging, cultures done  Additionally review of done: Drain output      Patient seen earlier today.  Awake, denies new complaints.  D/w Nursing.  Drain output less.  Planned for CT abdomen/pelvis    History reviewed. No pertinent past medical history.    Past Surgical History:   Procedure Laterality Date    BLADDER SURGERY N/A 06/01/2022    ENDOSCOPY OF ILEAL CONDUIT performed by Guinevere Ferrari, MD at [facility] MAIN OR    CT VISCERAL PERCUTANEOUS DRAIN  06/05/2022    CT VISCERAL PERCUTANEOUS DRAIN 06/05/2022 [facility] RAD CT    IR TUNNELED CATHETER PLACEMENT GREATER THAN 5  YEARS  07/01/2022    IR TUNNELED CATHETER PLACEMENT GREATER THAN 5 YEARS 07/01/2022 [facility] Gage, APRN - NP [facility] ANGIO IR    LAPAROSCOPY N/A 06/01/2022    LAPAROSCOPY DIAGNOSTIC performed by Guinevere Ferrari, MD at [facility] OR    LAPAROTOMY N/A 06/01/2022    LAPAROTOMY EXPLORATORY performed by Guinevere Ferrari, MD at [facility]       Allergies   Allergen Reactions    Augmentin [Amoxicillin-Pot Clavulanate] Hives     Tolerated ceftriaxone 06/2022    Codeine      Unknown reaction       Social Connections: Not on file       No family status information on file.           Review of Systems - Negative except those mentioned in H&P      PHYSICAL EXAM:  General:          Awake, in no distress  EENT:              EOMI. Anicteric sclerae. MMM  Resp:               CTA bilaterally, no wheezing or rales.  No accessory muscle use  CV:                  Regular  rhythm,  No edema  GI:                   Soft, Non distended, Non tender.  +Bowel sounds  Neurologic:      Alert and oriented X 3, normal speech,   Psych:             Good insight. Not anxious nor agitated  Skin:                No rashes.  No jaundice.  Extremities :  No edema, discoloration of  toes++.   Drain+ minimal drainage  Waymon Amato, MD Jerrel Ivory

## 2022-07-15 NOTE — Plan of Care (Signed)
Problem: Physical Therapy - Adult  Goal: By Discharge: Performs mobility at highest level of function for planned discharge setting.  See evaluation for individualized goals.  Description: FUNCTIONAL STATUS PRIOR TO ADMISSION: Patient was independent and active without use of DME. Drove himself to [place] from [place] for [employer] race. Denies home O2 use.     HOME SUPPORT PRIOR TO ADMISSION: The patient lived with wife however wife has her own medical issues and cannot provide physical assist.    Physical Therapy Goals  Re-assessed 07/14/22; goals remain appropriate  Re-Assessment 07/07/22 remain appropriate  1.  Patient will move from supine to sit and sit to supine, scoot up and down, and roll side to side in bed with mod  assist  within 7 day(s).   2. Patient will sit EOB x 15 minutes with contact guard assist within 7 days.    3.  Patient will perform sit to stand with maximal assistance x2 within 7 day(s).  4.  Patient will transfer from bed to chair and chair to bed via minimal lift equipment (best mover) within 7 day(s).      Re-Assessment 06/29/22  1.  Patient will move from supine to sit and sit to supine, scoot up and down, and roll side to side in bed with maximal assist of 1 within 7 day(s).   2. Patient will sit EOB x 15 minutes with contact guard assist within 7 days.    3.  Patient will perform sit to stand with maximal assistance x2 within 7 day(s).  4.  Patient will transfer from bed to chair and chair to bed via minimal lift equipment (best mover) within 7 day(s).    Reassessed 06/20/2022   1.  Patient will move from supine to sit and sit to supine, scoot up and down, and roll side to side in bed with maximal assist of 1 within 7 day(s).   2. Patient will sit EOB x 5 minutes with contact guard assist within 7 days.    3.  Patient will perform sit to stand with maximal assistance x2 within 7 day(s).  4.  Patient will transfer from bed to chair and chair to bed via minimal lift equipment (best  mover) within 7 day(s).    Initiated 06/10/2022  1.  Patient will move from supine to sit and sit to supine, scoot up and down, and roll side to side in bed with contact guard assist within 7 day(s).   2. Patient will sit EOB x 10 minutes with supervision/set-up assist within 7 days.    2.  Patient will perform sit to stand with moderate assistance x2 within 7 day(s).  3.  Patient will transfer from bed to chair and chair to bed with moderate assistance x2 using the least restrictive device within 7 day(s).  4.  Patient will ambulate with moderate assistance x2 for 5 feet with the least restrictive device within 7 day(s).   Outcome: Progressing   PHYSICAL THERAPY TREATMENT    Patient: Jaime [name] (76 y.o. 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 Patient resting in bed, agreeable to PT session. Patient does admit to feeling very fatigued, therapist educating patient on the need to push through the fatigue in order to continue to progress functionally. Patient verbalizes understanding. Completes supine<>sit (toward R) with Max Ax2. Tolerated sitting EOB for about 15 minutes, requiring intermittent assist for steadying with patient relying heavily on UE support to maintain balance. Set-up Best Mover in attempt to stand, patient able to reach and grab distal bar with minimal complaints of shoulder pain. Patient does c/o dizziness while sitting, vitals remaining stable.  Attempted to come to standing, despite Max Ax3 unable to clear buttock from elevated bed. Returned to supine with Max Ax2. Patient continues to be very motivated to participate in therapy despite fatigue and medical status. Continue to recommend rehab at discharge for best possible chance to return to PLOF.        PLAN:  Patient continues to benefit from skilled intervention to address the above impairments.  Continue treatment per established plan of care.    Recommendation for discharge: (in order for the patient to meet his/her long term goals): Therapy 3 hours/day 5-7 days/week    Other factors to consider for discharge: patient's current support system is unable to meet their requirements for physical assistance, high risk for falls, not safe to be alone, and concern for safely navigating or managing the home environment    IF patient discharges home will need the following DME: continuing to assess with progress       SUBJECTIVE:   Patient stated, "I am just so tired."    OBJECTIVE DATA SUMMARY:   Critical Behavior:  Orientation  Overall Orientation Status: Within Functional Limits  Orientation Level: Oriented to person;Oriented to situation;Oriented to place  Cognition  Overall Cognitive Status: WFL  Arousal/Alertness: Appropriate responses to stimuli  Following Commands: Follows one step commands with increased time  Attention Span: Difficulty dividing attention  Memory: Decreased recall of precautions;Decreased short term memory (patient reports "My memory is not too good")  Safety Judgement: Good awareness of safety precautions (improving; learning about his multiple dx; training need to direct staff for pressure relief, skin care, drain care etc; improvement demonstrated)  Problem Solving: Decreased awareness of errors;Assistance required to correct errors made;Assistance required to generate solutions (improving as task attention improves)  Insights: Decreased awareness of deficits  Initiation: Requires  cues for some  Sequencing: Requires cues for some  Cognition Comment: recommend cog screen; patient reports "My memory not too good now."    Functional Mobility Training:  Bed Mobility:  Bed Mobility Training  Bed Mobility Training: Yes  Interventions: Verbal cues;Manual cues  Rolling: Minimum assistance;Additional time;Adaptive equipment;Assist X1  Supine to Sit: Maximum assistance;Assist X2;Additional time  Sit to Supine: Maximum assistance;Assist X2;Additional time  Scooting: Total assistance  Transfers:  Psychologist, prison and probation services Training: Yes  Sit to Stand: Total assistance  Bed to Chair:  (not yet tolerated with best mover- recommended up to chair with hoyer at least 1 x /day)  Balance:  Balance  Sitting: Impaired  Sitting - Static: Fair (occasional);Supported sitting  Sitting - Dynamic: Fair (occasional);Poor (constant support)    Pain Rating:  3/10 shoulder pain    Activity Tolerance:   Poor, requires frequent rest breaks, observed shortness of breath on exertion, and SpO2 stable on room air    After treatment:   Patient left in no apparent distress  in bed, Call bell within reach, Bed/ chair alarm activated, Caregiver / family present, and Side rails x3      COMMUNICATION/EDUCATION:   The patient's plan of care was discussed with: occupational therapist and registered nurse    Patient Education  Education Given To: Patient  Education Provided: Plan of Care;Home Exercise Program;Fall Prevention Strategies  Education Method: Verbal  Barriers to Learning: None  Education Outcome: Verbalized understanding;Continued education needed      Neely Kammerer, PT  Minutes: 62

## 2022-07-15 NOTE — Progress Notes (Signed)
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    Seen on HD at 0840.  BP stable.    Needs outpatient HD placement under AKI diagnosis - CM working on options (here or back in [place])    Watch for renal recovery, but none so far.    H/H not at goal.  Continue ESA.    D/W pt            Subjective:     Chief Complaint:  No complaints.  No N/V.  No dyspnea.  No pain.          Review of Systems:    Symptom Y/N Comments  Symptom Y/N Comments   Fever/Chills    Chest Pain     Poor Appetite    Edema     Cough    Abdominal Pain     Sputum    Joint Pain     SOB/DOE    Pruritis/Rash     Nausea/vomit    Tolerating PT/OT     Diarrhea    Tolerating Diet     Constipation    Other       Could not obtain due to:      Objective:     VITALS:   Last 24hrs VS reviewed since prior progress note. Most recent are:  Vitals:    07/15/22 1000   BP: (!) 121/58   Pulse: 87   Resp:    Temp:    SpO2:        Intake/Output Summary (Last 24 hours) at 07/15/2022 1004  Last data filed at 07/15/2022 0415  Gross per 24 hour   Intake 100 ml   Output 450 ml   Net -350 ml        Telemetry Reviewed:     PHYSICAL EXAM:  General: NAD  trace edema  R TDC intact  B/l toes- dry gangrene      Lab Data Reviewed: (see below)    Medications Reviewed: (see below)    PMH/SH reviewed - no change compared to H&P  ________________________________________________________________________  Care Plan discussed with:  Patient     Family      RN     Care Manager                    Consultant:          Comments   >50% of visit spent in counseling and coordination of care       ________________________________________________________________________  Marina Gravel, MD     Procedures: see  electronic medical records for all procedures/Xrays and details which  were not copied into this note but were reviewed prior to creation of Plan.      LABS:  Recent Labs     07/13/22  0843 07/15/22  0805   WBC 7.8 8.1  HGB 7.8* 8.1*   HCT 25.7* 26.0*   PLT 157 153       Recent Labs     07/13/22  0319 07/15/22  0805   NA 134* 133*   K 3.9 4.0   CL 102 100   CO2 23 29   BUN 36* 26*   MG 2.8* 2.8*   PHOS 4.7 4.2  4.0         MEDICATIONS:  Current Facility-Administered Medications   Medication Dose Route Frequency    diphenhydrAMINE (BENADRYL) capsule 25 mg  25 mg Oral Q6H PRN    cefdinir (OMNICEF) capsule 300 mg  300 mg Oral Daily    sevelamer (RENVELA) packet 2.4 g  2.4 g Oral TID WC    heparin (porcine) 1000 UNIT/ML injection 1,800 Units  1,800 Units IntraCATHeter PRN    And    heparin (porcine) 1000 UNIT/ML injection 1,800 Units  1,800 Units IntraCATHeter PRN    guaiFENesin (ROBITUSSIN) 100 MG/5ML liquid 200 mg  200 mg Oral Q4H PRN    heparin (porcine) injection 5,000 Units  5,000 Units SubCUTAneous 3 times per day    therapeutic multivitamin-minerals 1 tablet  1 tablet Per NG tube Daily    amiodarone (CORDARONE) tablet 200 mg  200 mg Per NG tube Daily    acidophilus probiotic capsule 1 capsule  1 capsule Oral Daily    lidocaine 2 % injection 20 mL  20 mL IntraDERmal Once    collagenase ointment   Topical Daily    epoetin alfa-epbx (RETACRIT) injection 6,000 Units  6,000 Units SubCUTAneous Once per day on Mon Wed Fri    fentaNYL (SUBLIMAZE) injection 25 mcg  25 mcg IntraVENous Q1H PRN    oxyCODONE (ROXICODONE) immediate release tablet 5 mg  5 mg Oral Q4H PRN    midodrine (PROAMATINE) tablet 5 mg  5 mg Oral Daily PRN    insulin lispro (HUMALOG) injection vial 0-4 Units  0-4 Units SubCUTAneous 4 times per day    bisacodyl (DULCOLAX) suppository 10 mg  10 mg Rectal Daily PRN    albumin human 25% IV solution 25 g  25 g IntraVENous PRN    sodium chloride 0.9 % bolus 100 mL  100 mL IntraVENous PRN    balsum peru-castor  oil (VENELEX) ointment   Topical BID    glucose chewable tablet 16 g  4 tablet Oral PRN    dextrose bolus 10% 125 mL  125 mL IntraVENous PRN    Or    dextrose bolus 10% 250 mL  250 mL IntraVENous PRN    glucagon (rDNA) injection 1 mg  1 mg SubCUTAneous PRN    dextrose 10 % infusion   IntraVENous Continuous PRN    sodium chloride flush 0.9 % injection 5-40 mL  5-40 mL IntraVENous 2 times per day    sodium chloride flush 0.9 % injection 5-40 mL  5-40 mL IntraVENous PRN    0.9 % sodium chloride infusion   IntraVENous PRN    acetaminophen (TYLENOL) tablet 650 mg  650 mg Oral Q6H PRN    Or    acetaminophen (TYLENOL) suppository 650 mg  650 mg Rectal Q6H PRN    ondansetron (ZOFRAN) injection 4 mg  4 mg IntraVENous Q6H PRN

## 2022-07-15 NOTE — Other (Signed)
Pre Dialysis     07/15/22 0813   Vital Signs   BP (!) 146/74   Temp 98.6 F (37 C)   Pulse 82   Respirations 18   Pain Assessment   Pain Assessment None - Denies Pain   Treatment   Time On 0813   Time Off 1113   Treatment Goal 500   Observations & Evaluations   Level of Consciousness 0   Heart Rhythm Regular   Respiratory Quality/Effort Unlabored   O2 Device None (Room air)   Bilateral Breath Sounds Clear   Skin Condition/Temp Dry;Warm   Abdomen Inspection Soft   Bowel Sounds (All Quadrants) Present   Edema None   Technical Checks   Dialysis Machine No. 04   RO Machine Number [id number]   Dialyzer Lot No. [id number]   [place] Lot Number [id number]   All Connections Secure Yes   NS Bag Yes   Saline Line Double Clamped Yes   Dialyzer Revaclear 300   Prime Volume (mL) 250 mL   ICEBOAT I;C;E;B;O;A;T   RO Machine Log Sheet Completed Yes   Machine Alarm Self Test Completed;Passed   Child psychotherapist Function   Extracorporeal Chemical engineer Conductivity 14   Manual Conductivity 14   Machine Ph 7.4   Bleach Test (Neg) Yes   Bath Temperature 98.6 F (37 C)   Treatment Initiation   Dialyze Hours 3   Treatment  Initiation Universal Precautions maintained;Lines secured to patient;Connections secured;Prime given;Venous Parameters set;Arterial Parameters set;Air foam detector engaged;Dialysate;Saline line double clamped;Revaclear Dialyzer   Dialysis Bath   K+ (Potassium) 3   Ca+ (Calcium) 2.5   Na+ (Sodium) 140   HCO3 (Bicarb) 35   Bicarbonate Concentrate Lot No. [id number]   Acid Concentrate Lot No. [id number]     Primary RN SBAR: Liana Crocker, RN  Patient Education: CVC infection prevention & control.  Hepatitis B Surface Ag   Date/Time Value Ref Range Status   06/29/2022 09:53 AM <0.10 Index Final     Hep B S Ab   Date/Time Value Ref Range Status   06/29/2022 09:53 AM <3.10 mIU/mL Final     Post Dialysis     07/15/22 1113   Vital Signs   BP 134/66   Temp (!) 67.4 F (19.7 C)   Pulse 93    Respirations 18   Pain Assessment   Pain Assessment None - Denies Pain   Post-Hemodialysis Assessment   Post-Treatment Procedures Blood returned;Catheter capped, clamped and heparinized x 2 ports   Art therapist   Rinseback Volume (ml) 250 ml   Blood Volume Processed (Liters) 67.4 L   Dialyzer Clearance Lightly streaked   Duration of Treatment (minutes) 180 minutes   Hemodialysis Intake (ml) 500 ml   Hemodialysis Output (ml) 1000 ml   NET Removed (ml) 500   Tolerated Treatment Good   Bilateral Breath Sounds Clear   Edema None   Time Off 1113   Patient Disposition Return to room     Primary RN SBAR: Elmer Bales, RN  Comments: Pt completed treatment with no issues. Dr. Cordie Grice in to see pt while on HD.

## 2022-07-15 NOTE — Plan of Care (Incomplete)
Problem: Safety - Adult  Goal: Free from fall injury  07/15/2022 1910 by Gasper Sells, RN  Outcome: Progressing  07/15/2022 0834 by Elpidio Galea, RN  Outcome: Progressing  Flowsheets (Taken 07/14/2022 1900)  Free From Fall Injury: Instruct family/caregiver on patient safety     Problem: Respiratory - Adult  Goal: Achieves optimal ventilation and oxygenation  07/15/2022 1910 by Gasper Sells, RN  Outcome: Progressing  07/15/2022 0834 by Elpidio Galea, RN  Outcome: Progressing     Problem: Pain  Goal: Verbalizes/displays adequate comfort level or baseline comfort level  07/15/2022 1910 by Gasper Sells, RN  Outcome: Progressing  07/15/2022 0834 by Elpidio Galea, RN  Outcome: Progressing  Flowsheets (Taken 07/15/2022 0715 by Gasper Sells, RN)  Verbalizes/displays adequate comfort level or baseline comfort level:   Encourage patient to monitor pain and request assistance   Assess pain using appropriate pain scale     Problem: Discharge Planning  Goal: Discharge to home or other facility with appropriate resources  07/15/2022 1910 by Gasper Sells, RN  Outcome: Progressing  07/15/2022 0834 by Elpidio Galea, RN  Outcome: Progressing  Flowsheets (Taken 07/14/2022 1900)  Discharge to home or other facility with appropriate resources: Identify barriers to discharge with patient and caregiver     Problem: Chronic Conditions and Co-morbidities  Goal: Patient's chronic conditions and co-morbidity symptoms are monitored and maintained or improved  Outcome: Progressing     Problem: Neurosensory - Adult  Goal: Achieves maximal functionality and self care  Outcome: Progressing     Problem: Cardiovascular - Adult  Goal: Maintains optimal cardiac output and hemodynamic stability  Outcome: Progressing  Goal: Absence of cardiac dysrhythmias or at baseline  Outcome: Progressing     Problem: Gastrointestinal - Adult  Goal: Maintains or returns to baseline bowel function  Outcome: Progressing  Goal: Maintains adequate nutritional intake  Outcome: Progressing      Problem: Genitourinary - Adult  Goal: Absence of urinary retention  Outcome: Progressing     Problem: Metabolic/Fluid and Electrolytes - Adult  Goal: Electrolytes maintained within normal limits  Outcome: Progressing  Goal: Hemodynamic stability and optimal renal function maintained  Outcome: Progressing  Goal: Glucose maintained within prescribed range  Outcome: Progressing     Problem: Skin/Tissue Integrity - Adult  Goal: Skin integrity remains intact  Outcome: Progressing  Flowsheets (Taken 07/15/2022 0715)  Skin Integrity Remains Intact: Monitor for areas of redness and/or skin breakdown  Goal: Incisions, wounds, or drain sites healing without S/S of infection  Outcome: Progressing  Goal: Oral mucous membranes remain intact  Outcome: Progressing     Problem: Hematologic - Adult  Goal: Maintains hematologic stability  Outcome: Progressing     Problem: Musculoskeletal - Adult  Goal: Return mobility to safest level of function  Outcome: Progressing  Goal: Maintain proper alignment of affected body part  Outcome: Progressing  Goal: Return ADL status to a safe level of function  Outcome: Progressing     Problem: Skin/Tissue Integrity  Goal: Absence of new skin breakdown  Description: 1.  Monitor for areas of redness and/or skin breakdown  2.  Assess vascular access sites hourly  3.  Every 4-6 hours minimum:  Change oxygen saturation probe site  4.  Every 4-6 hours:  If on nasal continuous positive airway pressure, respiratory therapy assess nares and determine need for appliance change or resting period.  Outcome: Progressing     Problem: ABCDS Injury Assessment  Goal: Absence of physical injury  Outcome: Progressing

## 2022-07-15 NOTE — Plan of Care (Signed)
Speech LAnguage Pathology TREATMENT/DISCHARGE    Patient: Jaime [name] (76 y.o. male)  Date: 07/15/2022  Primary Diagnosis: Septicemia ([place]) [A41.9]  Gastric perforation ([place]) [K25.5]  Perforated abdominal viscus [R19.8]  Procedure(s) (LRB):  LAPAROTOMY EXPLORATORY (N/A)  LAPAROSCOPY DIAGNOSTIC (N/A)  ENDOSCOPY OF ILEAL CONDUIT (N/A) 44 Days Post-Op   Precautions: Fall Risk                  ASSESSMENT :  Therapy targeted dysphagia treatment on this date. Patient with improved oral efficiency on this date for solids. No overt difficulty or signs of aspiration observed with thin liquid or solid consistencies on this date. Patient observed with increased independence for self-feeding.    At this time, will advance to Regular/Thin Liquid with general aspiration precautions outlined below. Patient will be discharged from skilled speech-language pathology services at this time.     PLAN :  Recommendations and Planned Interventions:  Diet: Regular and thin liquids  -- Medication as tolerated  -- Routine oral care as able  -- General aspiration precautions: small bites/sips, upright for all PO, 1:1 assistance with mealtrays as needed, alternate liquids/solids as needed       Acute SLP Services: No, patient will be discharged from acute skilled speech-language pathology at this time.    Discharge Recommendations: No, additional SLP treatment not indicated at discharge     SUBJECTIVE:   Patient stated, "you have a blessed day."    OBJECTIVE:   History reviewed. No pertinent past medical history.  Past Surgical History:   Procedure Laterality Date    BLADDER SURGERY N/A 06/01/2022    ENDOSCOPY OF ILEAL CONDUIT performed by Melene Plan, MD at [facility] OR    CT VISCERAL PERCUTANEOUS DRAIN  06/05/2022    CT VISCERAL PERCUTANEOUS DRAIN 06/05/2022 [facility] RAD CT    IR TUNNELED CATHETER PLACEMENT GREATER THAN 5 YEARS  07/01/2022    IR TUNNELED CATHETER PLACEMENT GREATER THAN 5 YEARS 07/01/2022 [facility] [place], APRN - NP [facility] ANGIO IR     LAPAROSCOPY N/A 06/01/2022    LAPAROSCOPY DIAGNOSTIC performed by Melene Plan, MD at [facility] N/A 06/01/2022    LAPAROTOMY EXPLORATORY performed by Melene Plan, MD at [facility]     Prior Level of Function/Home Situation:   Social/Functional History  Lives With: Spouse  Type of Home: House  Home Layout: One level  Home Equipment: None  ADL Assistance: Independent  Ambulation Assistance: Independent  Transfer Assistance: Independent  Active Driver: Yes    Cognitive and Communication Status:  Neurologic State: Alert  Orientation Level: Oriented x4  Cognition: Appropriate for age attention/concentration and Follows commands    Dysphagia:  P.O. Trials:  PO Trials  Assessment Method(s): Observation  Patient Position: Upright in bed  Vocal Quality: No Impairment  Consistency Presented: Regular;Thin  How Presented: Self-fed/presented;Straw;Successive Swallows  Bolus Acceptance: No impairment  Bolus Formation/Control: No impairment  Propulsion: No impairment  Oral Residue: None  Laryngeal Elevation: Functional  Aspiration Signs/Symptoms: None  Pharyngeal Phase Characteristics: No impairment, issues, or problems            Respiratory Status/Airway:  Room air                         After treatment:   Patient left in no apparent distress in bed, Call bell left within reach, and Nursing notified    COMMUNICATION/EDUCATION:   The patient's plan of care including  recommendations, planned interventions, and recommended diet changes were discussed with: Registered nurse and Patient.    Patient/family have participated as able in goal setting and plan of care and Patient/family agree to work toward stated goals and plan of care    Thank you,  Lucius Conn, SLP  Minutes: 15     Problem: SLP Adult - Impaired Swallowing  Goal: By Discharge: Advance to least restrictive diet without signs or symptoms of aspiration for planned discharge setting.  See evaluation for individualized goals.  Description: Speech  Pathology Goals  1. Patient will participate in FEES to objectively assess swallow function prior to PO diet initiation. Goal initiated 06/22/22. Goal met 06/22/22.   2. Patient will tolerate therapeutic PO trials of thin liquids (e.g., ice chips, small sips of water) and purees without clinical s/s of aspiration or respiratory decline. Goal initiated 06/22/22. MET 06/30/2022  3. Patient will complete pharyngeal strengthening exercises (e.g., effortful swallow) with min cues for technique. Goal initiated 06/22/22. DISCONTINUED 07/15/2022  4. Patient will complete repeat instrumental swallow study to determine readiness for PO diet when clinically appropriate. Goal initiated 06/22/22. MET 06/26/22.    5. Patient will tolerate least restrictive diet with no overt s/s aspiration within 7 days. Initiated 06/26/22. Continued 06/30/2022 Continue 07/07/2022. MET 07/15/2022    Outcome: Completed

## 2022-07-15 NOTE — Progress Notes (Signed)
Bedside shift change report given to Sara King, RN (oncoming nurse) by Labron Bloodgood, RN (offgoing nurse). Report included the following information Nurse Handoff Report, Index, ED Encounter Summary, ED SBAR, Adult Overview, Surgery Report, Intake/Output, MAR, Recent Results, Med Rec Status, Cardiac Rhythm NSR, and Alarm Parameters.

## 2022-07-15 NOTE — Progress Notes (Signed)
1517 - Pt. report itching both arms from elbows to hands and request for medication to help. RN message hospitalist.    [phone] - Pt. without PIV access, Attending aware. Pt. refuse needle stick for AM lab work and prefer blood draw with Hemodialysis (MWF). RN notify hospitalist.

## 2022-07-15 NOTE — Plan of Care (Signed)
Problem: Safety - Adult  Goal: Free from fall injury  Outcome: Progressing  Flowsheets (Taken 07/14/2022 1900)  Free From Fall Injury: Instruct family/caregiver on patient safety     Problem: Respiratory - Adult  Goal: Achieves optimal ventilation and oxygenation  Outcome: Progressing     Problem: Pain  Goal: Verbalizes/displays adequate comfort level or baseline comfort level  Outcome: Progressing     Problem: Discharge Planning  Goal: Discharge to home or other facility with appropriate resources  Outcome: Progressing  Flowsheets (Taken 07/14/2022 1900)  Discharge to home or other facility with appropriate resources: Identify barriers to discharge with patient and caregiver     Problem: Safety - Medical Restraint  Goal: Remains free of injury from restraints (Restraint for Interference with Medical Device)  Description: INTERVENTIONS:  1. Determine that other, less restrictive measures have been tried or would not be effective before applying the restraint  2. Evaluate the patient's condition at the time of restraint application  3. Inform patient/family regarding the reason for restraint  4. Q2H: Monitor safety, psychosocial status, comfort, nutrition and hydration  Outcome: Progressing

## 2022-07-15 NOTE — Progress Notes (Incomplete)
A 

## 2022-07-16 LAB — POCT GLUCOSE
POC Glucose: 122 mg/dL — ABNORMAL HIGH (ref 65–117)
POC Glucose: 130 mg/dL — ABNORMAL HIGH (ref 65–117)
POC Glucose: 136 mg/dL — ABNORMAL HIGH (ref 65–117)
POC Glucose: 130 mg/dL — ABNORMAL HIGH (ref 65–117)
POC Glucose: 139 mg/dL — ABNORMAL HIGH (ref 65–117)
POC Glucose: 167 mg/dL — ABNORMAL HIGH (ref 65–117)
POC Glucose: 151 mg/dL — ABNORMAL HIGH (ref 65–117)

## 2022-07-16 MED FILL — HEPARIN SODIUM (PORCINE) 5000 UNIT/ML IJ SOLN: 5000 UNIT/ML | INTRAMUSCULAR | Qty: 1

## 2022-07-16 MED FILL — SEVELAMER CARBONATE 2.4 G PO PACK: 2.4 g | ORAL | Qty: 2.4

## 2022-07-16 MED FILL — AMIODARONE HCL 200 MG PO TABS: 200 MG | ORAL | Qty: 1

## 2022-07-16 MED FILL — RISAQUAD-2 PO CAPS: ORAL | Qty: 1

## 2022-07-16 MED FILL — THERA M PLUS PO TABS: ORAL | Qty: 1

## 2022-07-16 MED FILL — RENVELA 2.4 G PO PACK: 2.4 g | ORAL | Qty: 2.4

## 2022-07-16 MED FILL — OXYCODONE HCL 5 MG PO TABS: 5 MG | ORAL | Qty: 1

## 2022-07-16 MED FILL — CEFDINIR 300 MG PO CAPS: 300 MG | ORAL | Qty: 1

## 2022-07-16 NOTE — Progress Notes (Addendum)
1545: Bedside and Verbal report given to Nadara Mustard, RN  (oncoming nurse) by Huntley Dec, RN (offgoing nurse). Report included the following information Nurse Handoff Report, Index, ED Encounter Summary, Intake/Output, MAR, Recent Results, and Cardiac Rhythm NSR .      1745: Sacral wound care completed.

## 2022-07-16 NOTE — Progress Notes (Signed)
Hospitalist Progress Note    NAME:   Jaime [name]   DOB: [birth year]   MRN: [id number]     Date/Time: 07/16/2022 8:08 AM  Patient PCP: No primary care provider on file.    Estimated discharge date: 9/18   Barriers: Placement     For reference :   76 y/m with prolonged hospitalization, was admitted to surgical service on 7/31 for perforated hollow viscus underwent emergent laparotomy/ septic shock d/t peritonitis, was intubated and extubated x 2. Also had AKI needing CVVH, now on HD. Also has bilateral foot gangrene. Was transferred to hospitalist service on 8/21.     Assessment / Plan:  #Septic shock- resolved   D/t perforated viscus  S/p Laparotomy 7/31, with wound vac drainage   Liver abscess s/p IR drainage, hepatic drain to be removed  Bacteremia     -Discussed with case management regarding discharge disposition - wife needs to set up financial assistance for transfer of patient to [place] . Other option could be to go to an SNF in [place] [facility] ) .     9/13:on Omnicef p.o. due to lack of IV access.   plan to continue Omnicef p.o. x2 more weeks  ending 8 weeks on 9/ 29.      Antimicrobial orders for discharge  -Omnicef 300 mg p.o. daily end date 9/29  -- Pt will require repeat imaging of liver before conclusion of therapy  -Weekly CBC, CMP-  -Encourage adequate fluids, daily probiotic/yogurt  -ID follow-up - if pt remains in [place] of [place] call [phone]      #Acute hypoxemic respiratory failure:  Intubated 7/31, extubation 8/7  -Reintubated 8/13, extubated on 8/19  -Currently on 2 liters [place]  -Weaned off o2      #Dysphagia:  -Ongoing SLP evaluation-patient tolerating small diet .   -Aspiration on FEES study  -Follow-up SLP evaluation     #Acute kidney injury:  -Now on HD  -Continue with HD  -Nephrology following  -Case management informed the need to set up HD at SNF prior to discharge .       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 No active complaints. ".  Discussed with RN events overnight.       Objective:     VITALS:   Last 24hrs VS reviewed since prior progress note. Most recent are:  Patient Vitals for the past 24 hrs:   BP Temp Temp src Pulse Resp SpO2   07/16/22 0715 120/63 -- Oral 87 18 95 %   07/16/22 0327 (!) 143/71 98.3 F (36.8 C) Oral 99 18 90 %   07/16/22 0012 (!) 145/71 98.1 F (36.7 C) Oral 98 18 92 %   07/15/22 2056 (!) 144/74 99.7 F (37.6 C) Oral (!) 101  19 97 %   07/15/22 1500 139/74 98.5 F (36.9 C) Oral (!) 103 18 97 %   07/15/22 1300 (!) 142/70 -- -- 97 -- --   07/15/22 1239 (!) 142/70 97.6 F (36.4 C) Oral 94 18 97 %   07/15/22 1113 134/66 97.4 F (36.3 C) Oral 93 18 95 %   07/15/22 1100 128/66 -- -- 89 -- --   07/15/22 1045 131/67 -- -- 88 -- --   07/15/22 1030 131/64 -- -- 89 -- --   07/15/22 1015 126/65 -- -- 86 -- --   07/15/22 1000 (!) 121/58 -- -- 87 -- --   07/15/22 0945 129/66 -- -- 86 -- --   07/15/22 0930 128/70 -- -- 84 -- --   07/15/22 0915 123/65 -- -- 89 -- --   07/15/22 0900 117/63 -- -- 91 -- --   07/15/22 0845 (!) 107/53 -- -- 93 -- --   07/15/22 0830 126/69 97.6 F (36.4 C) -- 86 -- 95 %   07/15/22 0813 (!) 146/74 98.6 F (37 C) -- 82 18 --         Intake/Output Summary (Last 24 hours) at 07/16/2022 0808  Last data filed at 07/16/2022 0327  Gross per 24 hour   Intake 860 ml   Output 1026 ml   Net -166 ml        I had a face to face encounter and independently examined this  patient on 07/16/2022, as outlined below:  PHYSICAL EXAM:  General: Alert, cooperative  EENT:  EOMI. Anicteric sclerae..Rt. hd catn - jugular   Resp:  CTA bilaterally, no wheezing or rales.  No accessory muscle use  CV:  Regular  rhythm,  No edema  GI:  Soft, Non distended, Non tender.  +Bowel sounds, wound vac in place , drain on rt upper quadrant   Neurologic:  Alert and oriented X 3, normal speech,   Psych:   Good insight. Not anxious nor agitated  Foot :  bilateral feet - gangrene of first second and third toes , not yet full demarcation     Reviewed most current lab test results and cultures  YES  Reviewed most current radiology test results   YES  Review and summation of old records today    NO  Reviewed patient's current orders and MAR    YES  PMH/SH reviewed - no change compared to H&P  ________________________________________________________________________  Care Plan discussed with:    Comments   Patient x    Family      RN x    Care Manager     Consultant                        Multidiciplinary team rounds were held today with case manager, nursing, pharmacist and Higher education careers adviser.  Patient's plan of care was discussed; medications were reviewed and discharge planning was addressed.     ________________________________________________________________________  Total NON critical care TIME:  25  Minutes    Total CRITICAL CARE TIME Spent:   Minutes non procedure based      Comments   >50% of visit spent in counseling and coordination of care     ________________________________________________________________________  Jerilynn Birkenhead, MD     Procedures: see electronic medical records for all procedures/Xrays and details which were not copied into this note but were reviewed prior to creation of Plan.      LABS:  I reviewed today's most current  labs and imaging studies.  Pertinent labs include:  Recent Labs     07/13/22  0843 07/15/22  0805   WBC 7.8 8.1   HGB 7.8* 8.1*   HCT 25.7* 26.0*   PLT 157 153      Recent Labs     07/15/22  0805   NA 133*   K 4.0   CL 100   CO2 29   GLUCOSE 93   BUN 26*   CREATININE 4.26*   CALCIUM 8.2*   MG 2.8*   PHOS 4.2  4.0   LABALBU 2.1*       Signed: Jerilynn Birkenhead, MD

## 2022-07-16 NOTE — Plan of Care (Signed)
Problem: Safety - Adult  Goal: Free from fall injury  Outcome: Progressing     Problem: Respiratory - Adult  Goal: Achieves optimal ventilation and oxygenation  Outcome: Progressing  Flowsheets (Taken 07/16/2022 0715)  Achieves optimal ventilation and oxygenation: Assess for changes in respiratory status     Problem: Pain  Goal: Verbalizes/displays adequate comfort level or baseline comfort level  Outcome: Progressing  Flowsheets (Taken 07/16/2022 0715)  Verbalizes/displays adequate comfort level or baseline comfort level:   Encourage patient to monitor pain and request assistance   Assess pain using appropriate pain scale     Problem: Discharge Planning  Goal: Discharge to home or other facility with appropriate resources  Outcome: Progressing     Problem: Skin/Tissue Integrity  Goal: Absence of new skin breakdown  Description: 1.  Monitor for areas of redness and/or skin breakdown  2.  Assess vascular access sites hourly  3.  Every 4-6 hours minimum:  Change oxygen saturation probe site  4.  Every 4-6 hours:  If on nasal continuous positive airway pressure, respiratory therapy assess nares and determine need for appliance change or resting period.  Outcome: Progressing     Problem: ABCDS Injury Assessment  Goal: Absence of physical injury  Outcome: Progressing

## 2022-07-16 NOTE — Progress Notes (Signed)
NAME: Jaime [name]        DOB:  15-May-1946        MRN:  [id number]                     Assessment   :                                               Plan:  AKI  Pancreatitis  Hyponatremia  DM  Anemia  Toe ischemia KRT initiated 7/31; now on MWF schedule; perm cath 8/30.    No acute need for HD today.  Plan HD in AM.    Needs outpatient HD placement under AKI diagnosis - CM working on options (here or back in [place])    Watch for renal recovery, but none so far.    H/H not at goal.  Continue ESA.    D/W pt            Subjective:     Chief Complaint:  "I feel OK."  No N/V.  No dyspnea.  No pain.          Review of Systems:    Symptom Y/N Comments  Symptom Y/N Comments   Fever/Chills    Chest Pain     Poor Appetite    Edema     Cough    Abdominal Pain     Sputum    Joint Pain     SOB/DOE    Pruritis/Rash     Nausea/vomit    Tolerating PT/OT     Diarrhea    Tolerating Diet     Constipation    Other       Could not obtain due to:      Objective:     VITALS:   Last 24hrs VS reviewed since prior progress note. Most recent are:  Vitals:    07/16/22 0715   BP: 120/63   Pulse: 87   Resp: 18   Temp: 98.3 F (36.8 C)   SpO2: 95%       Intake/Output Summary (Last 24 hours) at 07/16/2022 0943  Last data filed at 07/16/2022 0327  Gross per 24 hour   Intake 860 ml   Output 1026 ml   Net -166 ml        Telemetry Reviewed:     PHYSICAL EXAM:  General: NAD  trace edema  R TDC intact  B/l toes- dry gangrene      Lab Data Reviewed: (see below)    Medications Reviewed: (see below)    PMH/SH reviewed - no change compared to H&P  ________________________________________________________________________  Care Plan discussed with:  Patient     Family      RN     Care Manager                    Consultant:          Comments   >50% of visit spent in counseling and coordination of care       ________________________________________________________________________  Marina Gravel, MD     Procedures: see electronic medical records for all procedures/Xrays and details which  were not copied into this note but were reviewed prior to creation of Plan.      LABS:  Recent Labs     07/15/22  0805   WBC 8.1  HGB 8.1*   HCT 26.0*   PLT 153       Recent Labs     07/15/22  0805   NA 133*   K 4.0   CL 100   CO2 29   BUN 26*   MG 2.8*   PHOS 4.2  4.0         MEDICATIONS:  Current Facility-Administered Medications   Medication Dose Route Frequency    diphenhydrAMINE (BENADRYL) capsule 25 mg  25 mg Oral Q6H PRN    cefdinir (OMNICEF) capsule 300 mg  300 mg Oral Daily    sevelamer (RENVELA) packet 2.4 g  2.4 g Oral TID WC    heparin (porcine) 1000 UNIT/ML injection 1,800 Units  1,800 Units IntraCATHeter PRN    And    heparin (porcine) 1000 UNIT/ML injection 1,800 Units  1,800 Units IntraCATHeter PRN    guaiFENesin (ROBITUSSIN) 100 MG/5ML liquid 200 mg  200 mg Oral Q4H PRN    heparin (porcine) injection 5,000 Units  5,000 Units SubCUTAneous 3 times per day    therapeutic multivitamin-minerals 1 tablet  1 tablet Per NG tube Daily    amiodarone (CORDARONE) tablet 200 mg  200 mg Per NG tube Daily    acidophilus probiotic capsule 1 capsule  1 capsule Oral Daily    lidocaine 2 % injection 20 mL  20 mL IntraDERmal Once    collagenase ointment   Topical Daily    epoetin alfa-epbx (RETACRIT) injection 6,000 Units  6,000 Units SubCUTAneous Once per day on Mon Wed Fri    fentaNYL (SUBLIMAZE) injection 25 mcg  25 mcg IntraVENous Q1H PRN    oxyCODONE (ROXICODONE) immediate release tablet 5 mg  5 mg Oral Q4H PRN    midodrine (PROAMATINE) tablet 5 mg  5 mg Oral Daily PRN    insulin lispro (HUMALOG) injection vial 0-4 Units  0-4 Units SubCUTAneous 4 times per day    bisacodyl (DULCOLAX) suppository 10 mg  10 mg Rectal Daily PRN    albumin human 25% IV solution 25 g  25 g IntraVENous PRN    sodium chloride 0.9 % bolus 100 mL  100 mL IntraVENous PRN    balsum peru-castor oil (VENELEX) ointment   Topical BID     glucose chewable tablet 16 g  4 tablet Oral PRN    dextrose bolus 10% 125 mL  125 mL IntraVENous PRN    Or    dextrose bolus 10% 250 mL  250 mL IntraVENous PRN    glucagon (rDNA) injection 1 mg  1 mg SubCUTAneous PRN    dextrose 10 % infusion   IntraVENous Continuous PRN    sodium chloride flush 0.9 % injection 5-40 mL  5-40 mL IntraVENous 2 times per day    sodium chloride flush 0.9 % injection 5-40 mL  5-40 mL IntraVENous PRN    0.9 % sodium chloride infusion   IntraVENous PRN    acetaminophen (TYLENOL) tablet 650 mg  650 mg Oral Q6H PRN    Or    acetaminophen (TYLENOL) suppository 650 mg  650 mg Rectal Q6H PRN    ondansetron (ZOFRAN) injection 4 mg  4 mg IntraVENous Q6H PRN

## 2022-07-16 NOTE — Care Coordination-Inpatient (Addendum)
Transition of Care Plan:     RUR: 19%   Prior Level of Functioning: independent  Disposition: LTACH vs IPR vs SNF in [place] with HD set up- will need auth  If SNF or IPR: Date FOC offered: 06/30/22 LTACH and ongoing  Date FOC received:   Accepting facility: TBD  Date authorization started with reference number:   Date authorization received and expires:   Follow up appointments:   DME needed: per faciltiy  Transportation at discharge: TBD likely BLS  IM/IMM Medicare/Tricare letter given: will need prior to discharge  Is patient a Veteran and connected with VA?               If yes, was [employer] transfer form completed and VA notified?   Caregiver Contact: [employer] [place] - Wife - [phone]  Discharge Caregiver contacted prior to discharge? no  Care Conference needed?   Barriers to discharge: new HD, wound vac/wound care orders, placement and insurance authorization, transportation for OP HD       CM left a voice mail for [place] wife to call CM back regarding SNF choices- awaiting call back - patient made aware - per PT they indicate patient would now benefit from IPR level of care - met with patient who is agreeable - referral made to IPR in [place] via [place] to the following facilites:    [facility] ph# [phone] - left VM  [facility] ph# [phone] - left VM    [facility] - ph# [phone] - left VM for Shawn Route    [facility] ph# [phone]- left VM for Leone Brand    Referral made to [facility] facilities -  in [place] left VM at main number of [phone] admissions  ph# [phone]    CM awaiting call back from [place] wife regarding SNF and also from IPR level of care for confirmation of referrals sent and acceptance- will need insurance authorization.  Chales Abrahams, MSW    Received call back from [facility] and they do not have in house HD- unable to accept patient    Per [facility] - unable to accept patient     CM spoke  with [place] wife and she is agreeble to also pursuing SNF's in their area hopefully that have in house HD with any ratings of 3 or more stars - CM sent referrals via [facility] to 12 facilites in the [place] and surrounding areas.  Chales Abrahams, MSW

## 2022-07-16 NOTE — Plan of Care (Signed)
Problem: Safety - Adult  Goal: Free from fall injury  07/16/2022 1732 by Evangeline Dakin, RN  Outcome: Progressing  07/16/2022 1152 by Dorita Sciara, RN  Outcome: Progressing     Problem: Respiratory - Adult  Goal: Achieves optimal ventilation and oxygenation  07/16/2022 1732 by Evangeline Dakin, RN  Outcome: Progressing  07/16/2022 1152 by Dorita Sciara, RN  Outcome: Progressing  Flowsheets (Taken 07/16/2022 0715)  Achieves optimal ventilation and oxygenation: Assess for changes in respiratory status     Problem: Pain  Goal: Verbalizes/displays adequate comfort level or baseline comfort level  07/16/2022 1732 by Evangeline Dakin, RN  Outcome: Progressing  Flowsheets (Taken 07/16/2022 1500)  Verbalizes/displays adequate comfort level or baseline comfort level: Encourage patient to monitor pain and request assistance  07/16/2022 1152 by Dorita Sciara, RN  Outcome: Progressing  Flowsheets (Taken 07/16/2022 0715)  Verbalizes/displays adequate comfort level or baseline comfort level:   Encourage patient to monitor pain and request assistance   Assess pain using appropriate pain scale     Problem: Discharge Planning  Goal: Discharge to home or other facility with appropriate resources  07/16/2022 1152 by Dorita Sciara, RN  Outcome: Progressing     Problem: Chronic Conditions and Co-morbidities  Goal: Patient's chronic conditions and co-morbidity symptoms are monitored and maintained or improved  07/16/2022 1152 by Dorita Sciara, RN  Outcome: Progressing     Problem: Neurosensory - Adult  Goal: Achieves maximal functionality and self care  07/16/2022 1732 by Evangeline Dakin, RN  Outcome: Progressing  07/16/2022 1152 by Dorita Sciara, RN  Outcome: Progressing  Flowsheets (Taken 07/16/2022 0715)  Achieves maximal functionality and self care: Monitor swallowing and airway patency with patient fatigue and changes in neurological status     Problem: Cardiovascular - Adult  Goal: Maintains optimal cardiac output and hemodynamic stability  07/16/2022 1152 by Dorita Sciara, RN  Outcome: Progressing  Goal: Absence of cardiac dysrhythmias or at baseline  07/16/2022 1152 by Dorita Sciara, RN  Outcome: Progressing     Problem: Gastrointestinal - Adult  Goal: Maintains or returns to baseline bowel function  07/16/2022 1152 by Dorita Sciara, RN  Outcome: Progressing  Flowsheets (Taken 07/15/2022 2300 by Gerre Pebbles, RN)  Maintains or returns to baseline bowel function: Assess bowel function  Goal: Maintains adequate nutritional intake  07/16/2022 1732 by Evangeline Dakin, RN  Outcome: Progressing  07/16/2022 1152 by Dorita Sciara, RN  Outcome: Progressing  Flowsheets (Taken 07/15/2022 2300 by Gerre Pebbles, RN)  Maintains adequate nutritional intake: Monitor percentage of each meal consumed     Problem: Genitourinary - Adult  Goal: Absence of urinary retention  07/16/2022 1732 by Evangeline Dakin, RN  Outcome: Progressing  07/16/2022 1152 by Dorita Sciara, RN  Outcome: Progressing     Problem: Metabolic/Fluid and Electrolytes - Adult  Goal: Electrolytes maintained within normal limits  07/16/2022 1152 by Dorita Sciara, RN  Outcome: Progressing  Goal: Hemodynamic stability and optimal renal function maintained  07/16/2022 1152 by Dorita Sciara, RN  Outcome: Progressing  Goal: Glucose maintained within prescribed range  07/16/2022 1152 by Dorita Sciara, RN  Outcome: Progressing     Problem: Skin/Tissue Integrity - Adult  Goal: Skin integrity remains intact  07/16/2022 1152 by Dorita Sciara, RN  Outcome: Progressing  Flowsheets (Taken 07/16/2022 0715)  Skin Integrity Remains Intact: Monitor for areas of redness and/or skin breakdown  Goal: Incisions, wounds, or drain sites healing without S/S of infection  07/16/2022 1152 by Dorita Sciara, RN  Outcome: Progressing  Flowsheets (Taken 07/16/2022  0715)  Incisions, Wounds, or Drain Sites Healing Without Sign and Symptoms of Infection: ADMISSION and DAILY: Assess and document risk factors for pressure ulcer development  Goal: Oral mucous membranes remain intact  07/16/2022 1152 by  Dorita Sciara, RN  Outcome: Progressing  Flowsheets (Taken 07/16/2022 0715)  Oral Mucous Membranes Remain Intact: Assess oral mucosa and hygiene practices     Problem: Hematologic - Adult  Goal: Maintains hematologic stability  07/16/2022 1152 by Dorita Sciara, RN  Outcome: Progressing     Problem: Musculoskeletal - Adult  Goal: Return mobility to safest level of function  07/16/2022 1152 by Dorita Sciara, RN  Outcome: Progressing  Flowsheets (Taken 07/15/2022 2300 by Gerre Pebbles, RN)  Return Mobility to Safest Level of Function: Apply continuous passive motion per provider or physical therapy orders to increase flexion toward goal  Goal: Maintain proper alignment of affected body part  07/16/2022 1152 by Dorita Sciara, RN  Outcome: Progressing  Flowsheets (Taken 07/15/2022 2300 by Gerre Pebbles, RN)  Maintain proper alignment of affected body part: Support and protect limb and body alignment per provider's orders  Goal: Return ADL status to a safe level of function  07/16/2022 1152 by Dorita Sciara, RN  Outcome: Progressing  Flowsheets (Taken 07/15/2022 2300 by Gerre Pebbles, RN)  Return ADL Status to a Safe Level of Function: Administer medication as ordered     Problem: Skin/Tissue Integrity  Goal: Absence of new skin breakdown  Description: 1.  Monitor for areas of redness and/or skin breakdown  2.  Assess vascular access sites hourly  3.  Every 4-6 hours minimum:  Change oxygen saturation probe site  4.  Every 4-6 hours:  If on nasal continuous positive airway pressure, respiratory therapy assess nares and determine need for appliance change or resting period.  07/16/2022 1152 by Dorita Sciara, RN  Outcome: Progressing     Problem: ABCDS Injury Assessment  Goal: Absence of physical injury  07/16/2022 1152 by Dorita Sciara, RN  Outcome: Progressing     Problem: Occupational Therapy - Adult  Goal: By Discharge: Performs self-care activities at highest level of function for planned discharge setting.  See evaluation for individualized  goals.  Description: FUNCTIONAL STATUS PRIOR TO ADMISSION:     ADL Assistance: Independent, Ambulation Assistance: Independent, Transfer Assistance: Independent, Active Driver: Yes     HOME SUPPORT: Patient lived with spouse and was independent.    Occupational Therapy Goals:  Initiated 06/10/2022; Re-Evaluation 06/20/2022.    Weekly Reassessment 06/29/22 - Continue as below. Goal #6 added. Goals reviewed and revised PRN as per 9/7 weekly reevaluation.   Goals reviewed and revised 9-14  1.  Patient will perform grooming at bed level with Moderate Assist within 7 day(s). Continue goal.  2.  Patient will perform self-feeding with Moderate Assist within 7 day(s). Continue goal. Met: upgrade to mod I in 7 days  3.  Patient will perform upper body dressing with Maximal Assist within 7 day(s). Continue goal.   4.  Patient will complete supine>sit with mod assist x2  within 7 day(s). Goal met, change to min A of 1.   5.  Patient will tolerate sitting EOB with fair sitting balance in preparation for ADLs within 7 day(s). Goal  met, change to good.  6.  Patient will participate in BUE therapeutic activity / exercise with Minimal Assist within 7 day(s). Goal met, change to S distally and min A proximally within 7 days. met  7.  Patient will participate in cog screening to  clarify discharge planning within 7 days      07/16/2022 1656 by [name], OT  Outcome: Progressing     Problem: Safety - Medical Restraint  Goal: Remains free of injury from restraints (Restraint for Interference with Medical Device)  Description: INTERVENTIONS:  1. Determine that other, less restrictive measures have been tried or would not be effective before applying the restraint  2. Evaluate the patient's condition at the time of restraint application  3. Inform patient/family regarding the reason for restraint  4. Q2H: Monitor safety, psychosocial status, comfort, nutrition and hydration  07/16/2022 1152 by Dorita Sciara, RN  Outcome: Progressing

## 2022-07-16 NOTE — Plan of Care (Signed)
Problem: Occupational Therapy - Adult  Goal: By Discharge: Performs self-care activities at highest level of function for planned discharge setting.  See evaluation for individualized goals.  Description: FUNCTIONAL STATUS PRIOR TO ADMISSION:     ADL Assistance: Independent, Ambulation Assistance: Independent, Transfer Assistance: Independent, Active Driver: Yes     HOME SUPPORT: Patient lived with spouse and was independent.    Occupational Therapy Goals:  Initiated 06/10/2022; Re-Evaluation 06/20/2022.    Weekly Reassessment 06/29/22 - Continue as below. Goal #6 added. Goals reviewed and revised PRN as per 9/7 weekly reevaluation.   Goals reviewed and revised 9-14  1.  Patient will perform grooming at bed level with Moderate Assist within 7 day(s). Continue goal.  2.  Patient will perform self-feeding with Moderate Assist within 7 day(s). Continue goal. Met: upgrade to mod I in 7 days  3.  Patient will perform upper body dressing with Maximal Assist within 7 day(s). Continue goal.   4.  Patient will complete supine>sit with mod assist x2  within 7 day(s). Goal met, change to min A of 1.   5.  Patient will tolerate sitting EOB with fair sitting balance in preparation for ADLs within 7 day(s). Goal  met, change to good.  6.  Patient will participate in BUE therapeutic activity / exercise with Minimal Assist within 7 day(s). Goal met, change to S distally and min A proximally within 7 days. met  7.  Patient will participate in cog screening to clarify discharge planning within 7 days      Outcome: Progressing   OCCUPATIONAL THERAPY TREATMENT: WEEKLY REASSESSMENT    Patient: Jaime [name] 76 y.o. male)  Date: 07/16/2022  Primary Diagnosis: Septicemia ([place]) [A41.9]  Gastric perforation ([place]) [K25.5]  Perforated abdominal viscus [R19.8]  Procedure(s) (LRB):  LAPAROTOMY EXPLORATORY (N/A)  LAPAROSCOPY DIAGNOSTIC (N/A)  ENDOSCOPY OF ILEAL CONDUIT (N/A) 45 Days Post-Op   Precautions: Fall Risk, General Precautions (resp  failure 05-31-22; skin- pressure wounds sacral/buttocks, necrosis B feet, Impaired B shoulder PROM/general debility; drain R side)                Chart, occupational therapy assessment, plan of care, and goals were reviewed.    ASSESSMENT  Patient continues to benefit from skilled OT services and is progressing towards goals. Making nice progress in all aspects of care this week; better task attention, improving functional endurance, slowly improving functional endurance and ability to carryover instructions; B shoulders with improving AAROM- still painful at end range but well able to tolerate AAROM and use of UEs in bed mobility and UE ADLs, able to consistently feed himself. Small consistent improvement noted daily.             PLAN  Goals have been updated based on progression since last assessment.  Patient continues to benefit from skilled intervention to address the above impairments.    Recommendations and Planned Interventions:   self care training, therapeutic activities, functional mobility training, balance training, therapeutic exercise, neuromuscular re-education, visual/perceptual training, endurance activities, cognitive retraining, patient education, home safety training, and family training/education    Frequency/Duration: OT Plan of Care: 4 times/week    Recommend with staff: hoyer to chair or bed in chair position for meals (watch feet)    Recommend next OT session: cog screen; Harrel Lemon transfer    Recommendation for discharge: (in order for the patient to meet his/her long term goals): Therapy 3 hours/day 5-7 days/week    Other factors to consider for discharge: available support system  works or is unable to provide adequate supervision and the patient would be alone, patient's current support system is unable to meet their requirements for physical assistance, high risk for falls, not safe to be alone, and concern for safely navigating or managing the home environment    IF patient discharges home  will need the following DME:  TBA by rehab       SUBJECTIVE:   Patient stated "I could touch my head last night for the first time."    OBJECTIVE DATA SUMMARY:   Cognitive/Behavioral Status:  Orientation  Overall Orientation Status: Within Functional Limits  Cognition  Overall Cognitive Status: WFL  Attention Span: Difficulty dividing attention  Memory: Decreased short term memory  Safety Judgement: Good awareness of safety precautions (vs decreased initiation of precautions, eg not yet taking ownership of pressure relief schedule etc., but is aware if questioned)  Problem Solving: Decreased awareness of errors;Assistance required to identify errors made;Assistance required to implement solutions;Assistance required to correct errors made;Assistance required to generate solutions (max difficulty to initiate shirt don- unable to orient shirt properly)  Insights: Decreased awareness of deficits (did not appear to recognize errors donning shirt)  Initiation: Requires cues for some  Sequencing: Requires cues for some (esp noted today with UE dressing)      ADL Intervention:         Feeding: Setup       Grooming: Minimal assistance       UE Bathing: Moderate assistance;Maximum assistance       LE Bathing: Dependent/Total       UE Dressing: Maximum assistance  UE Dressing Skilled Clinical Factors: very pleased "I could touch my head last night- I cant do it now but I could after therapy yesterday! It's working!" worked with [employer]- max A to orient shirt, mod A to slide up B UEs and Mod A over/on/off head- excellent improvement!!    LE Dressing: Dependent/Total       Toileting: Dependent/Total                                                                                                                                                                                                                                                 Barthel Index:    Barthel Index Scale  Feeding: Needs help, i.e. for  cutting  Bathing: Cannot  perform activity  Grooming: Cannot perform activity  Dressing: Cannot perform activity  Bowel Control: Cannot perform activity  Bladder Control: Cannot perform activity  Toilet Transfers: Cannot perform activity  Chair/Bed Trannsfers: Cannot perform activity  Ambulation: Cannot perform activity  Stairs: Cannot perform activity  Total Barthel Index Score: 5       The Barthel ADL Index: Guidelines  1. The index should be used as a record of what a patient does, not as a record of what a patient could do.  2. The main aim is to establish degree of independence from any help, physical or verbal, however minor and for whatever reason.  3. The need for supervision renders the patient not independent.  4. A patient's performance should be established using the best available evidence. Asking the patient, friends/relatives and nurses are the usual sources, but direct observation and common sense are also important. However direct testing is not needed.  5. Usually the patient's performance over the preceding 24-48 hours is important, but occasionally longer periods will be relevant.  6. Middle categories imply that the patient supplies over 50 per cent of the effort.  7. Use of aids to be independent is allowed.    Score Interpretation (from [place])   80-100 Independent   60-79 Minimally independent   40-59 Partially dependent   20-39 Very dependent   <20 Totally dependent     -Mahoney, F.l., Barthel, D.W. (1965). Functional evaluation: the Barthel Index. [place] ([place]., March AR[facility] (1997). The Barthel activities of daily living index: self-reporting versus actual performance in the old (> or = 75 years). Journal of [place] 45(7), [place], J.J.M.F, Noni Saupe., Minette [place]. (1999). Measuring the change in disability after inpatient rehabilitation; comparison of the responsiveness of the Barthel Index and Functional  Independence Measure. Journal of Neurology, Neurosurgery, and Psychiatry, 66(4), [id number].  [employer]ford Sports, N.J.A, [place],  W.J.M, & Koop[place]chap, M.A. (2004) Assessment of post-stroke quality of life in cost-effectiveness studies: The usefulness of the Barthel Index and the EuroQoL-5D. Quality of Life Research, 13, 427-43                                                                                                                                                                                                                                 Pain Rating:  5/10 shoulders at end range  flexion ~ 90 degrees  Pain Intervention(s):   rest, repositioning, and pain is at a level acceptable to the patient    Activity Tolerance:   Fair , requires rest breaks, and SpO2 stable on room air  Please refer to the flowsheet for vital signs taken during this treatment.    After treatment:   Patient left in no apparent distress in bed, Call bell within reach, Side rails x3, and Heels elevated for pressure relief    COMMUNICATION/EDUCATION:   The patient's plan of care was discussed with: physical therapist and registered nurse    Patient Education  Education Given To: Patient  Education Provided: Plan of Care;Home Exercise Program;ADL Adaptive Strategies;Fall Prevention Strategies;Energy Conservation;Precautions;Orientation  Education Provided Comments: benefit of SROM and HEP consistently during hours therapy not here  Education Method: Demonstration;Verbal;Teach Back  Barriers to Learning: Cognition  Education Outcome: Verbalized understanding;Demonstrated understanding;Continued education needed    Thank you for this referral.  Jean Rosenthal, OTR/L  Minutes: 36

## 2022-07-17 LAB — BASIC METABOLIC PANEL
Anion Gap: 4 mmol/L — ABNORMAL LOW (ref 5–15)
BUN: 22 MG/DL — ABNORMAL HIGH (ref 6–20)
Bun/Cre Ratio: 6 — ABNORMAL LOW (ref 12–20)
CO2: 31 mmol/L (ref 21–32)
Calcium: 8.3 MG/DL — ABNORMAL LOW (ref 8.5–10.1)
Chloride: 102 mmol/L (ref 97–108)
Creatinine: 3.94 MG/DL — ABNORMAL HIGH (ref 0.70–1.30)
Est, Glom Filt Rate: 15 mL/min/{1.73_m2} — ABNORMAL LOW (ref >60)
Glucose: 148 mg/dL — ABNORMAL HIGH (ref 65–100)
Potassium: 3.6 mmol/L (ref 3.5–5.1)
Sodium: 137 mmol/L (ref 136–145)

## 2022-07-17 LAB — POCT GLUCOSE
POC Glucose: 138 mg/dL — ABNORMAL HIGH (ref 65–117)
POC Glucose: 144 mg/dL — ABNORMAL HIGH (ref 65–117)
POC Glucose: 114 mg/dL (ref 65–117)
POC Glucose: 152 mg/dL — ABNORMAL HIGH (ref 65–117)

## 2022-07-17 LAB — PHOSPHORUS: Phosphorus: 2.8 MG/DL (ref 2.6–4.7)

## 2022-07-17 LAB — MAGNESIUM: Magnesium: 2.8 mg/dL — ABNORMAL HIGH (ref 1.6–2.4)

## 2022-07-17 MED ORDER — HEPARIN SODIUM (PORCINE) 1000 UNIT/ML IJ SOLN
1000 UNIT/ML | INTRAMUSCULAR | Status: AC
Start: 2022-07-17 — End: 2022-07-17
  Administered 2022-07-17: 15:00:00 1800

## 2022-07-17 MED FILL — RENVELA 2.4 G PO PACK: 2.4 g | ORAL | Qty: 2.4

## 2022-07-17 MED FILL — HEPARIN SODIUM (PORCINE) 1000 UNIT/ML IJ SOLN: 1000 UNIT/ML | INTRAMUSCULAR | Qty: 10

## 2022-07-17 MED FILL — HEPARIN SODIUM (PORCINE) 5000 UNIT/ML IJ SOLN: 5000 UNIT/ML | INTRAMUSCULAR | Qty: 1

## 2022-07-17 NOTE — Progress Notes (Signed)
Occupational Therapy    Chart reviewed. Attempted to see pt, however, currently off floor for HD. Will hold and follow-up later as able and medically appropriate. Thanks.    Caralyn Guile, MS, OTR/L

## 2022-07-17 NOTE — Wound Image (Signed)
Wound Care follow up for the Mid abdominal surgical wound.  Chart reviewed and patient assessed. Pt to continue the Wound Vac at least until he is accepted into a facility for continued care. Still trying to get him to [facility].   Assessment: the wound is now 100 % pink granulation tissue and no slough in the base. Contraction of the wound is occurring over the past week. The wound now measures 19 cm x 2.8 x 1.8 cm.   The drainage in the canister is serous and no blood. The hemovac in the liver has minimal drainage now.   Pt. Is c/o some left side shoulder pain that is quite painful with wincing when palpated. Pt. Does not recall any kind of trauma.   Nurse alerted to this and she is escalating it as needed.         Treatment: The wound care was completed today with the new Wound Vac dressing.   Nursing had already done the sacrum.    Plan: wound care to continue on the Sacrum using Santyl daily and the vashe wet to dry.    Betadine to the toes daily to keep them dry and open to air.   Wound Vac dressing change to be done on Tuesday.   Note: If the patient has discharge orders prior to Tuesday - the wound vac has to be removed and a Vashe solution wet to dry dressing applied to the Abdominal wound.   Clyda Greener, RN, BSN, [employer]

## 2022-07-17 NOTE — Other (Signed)
07/17/22 1036   Vital Signs   BP (!) 157/78   Temp 97.7 F (36.5 C)   Pulse 91   Respirations 18   Pain Assessment   Pain Assessment None - Denies Pain   Post-Hemodialysis Assessment   Post-Treatment Procedures Blood returned;Catheter capped, clamped and heparinized x 2 ports   Herbalist   Rinseback Volume (ml) 300 ml   Blood Volume Processed (Liters) 67 L   Dialyzer Clearance Lightly streaked   Duration of Treatment (minutes) 180 minutes   Hemodialysis Intake (ml) 500 ml   Hemodialysis Output (ml) 1000 ml   NET Removed (ml) 500   Tolerated Treatment Good   Bilateral Breath Sounds Clear   Edema Right lower extremity;Left lower extremity   RLE Edema Trace;+1   LLE Edema Trace;+1   Time Off 1036   Patient Disposition Return to room     Primary RN SBAR: Evangeline Dakin, RN  Comments: Pt tolerated treatment well without complaints or complications.

## 2022-07-17 NOTE — Other (Addendum)
Primary RN SBAR: Geronimo Running, RN  Patient Education: HD Treatment  Hepatitis B Surface Ag   Date/Time Value Ref Range Status   06/29/2022 09:53 AM <0.10 Index Final     Hep B S Ab   Date/Time Value Ref Range Status   06/29/2022 09:53 AM <3.10 mIU/mL Final        07/17/22 0730   Vital Signs   BP (!) 148/82   Temp 97.8 F (36.6 C)   Pulse 96   Respirations 18   SpO2 96 %   Pain Assessment   Pain Assessment None - Denies Pain   Treatment   Time On 0730   Treatment Goal 500 ml   Observations & Evaluations   Level of Consciousness 0   Oriented X 4   Heart Rhythm Regular   Respiratory Quality/Effort Unlabored   O2 Device None (Room air)   Bilateral Breath Sounds Clear   Skin Color Ecchymosis (comment)   Skin Condition/Temp Dry;Warm   Appetite Fair   Abdomen Inspection Soft   Bowel Sounds (All Quadrants) Active   Edema Right lower extremity;Left lower extremity   RLE Edema +1   LLE Edema +1   Technical Checks   Dialysis Machine No. 04   RO Machine Number [id number]   Dialyzer Lot No. [id number]   [place] Lot Number [id number]   All Connections Secure Yes   NS Bag Yes   Saline Line Double Clamped Yes   Dialyzer Revaclear 300   Prime Volume (mL) 200 mL   ICEBOAT I;C;E;B;O;A;T   RO Machine Log Sheet Completed Yes   Machine Alarm Self Test Completed;Passed   Materials engineer Function   Extracorporeal Advertising copywriter Conductivity 13.9   Manual Ph 7.2   Bleach Test (Neg) Yes   Bath Temperature 98.6 F (37 C)   Treatment Initiation   Dialyze Hours 3   Treatment  Initiation Universal Precautions maintained;Lines secured to patient;Connections secured;Prime given;Venous Parameters set;Arterial Parameters set;Chiropodist engaged;Saline line double clamped;Revaclear Dialyzer   Dialysis Bath   K+ (Potassium) 3   Ca+ (Calcium) 2.5   Na+ (Sodium) 140   HCO3 (Bicarb) 35

## 2022-07-17 NOTE — Progress Notes (Signed)
Infectious Disease Progress    Impression      Hepatic abscess  Gas and fluid containing collection 7.1 x 10.8 x 5.4 cm  In anterior left lobe of liver, patchy areas of hypodensity right lobe  4 x 4.5 x 5.5 cm collection  S/p drainage by IR 8/4.Cultures + for  few E.coli  CT abdomen/pelvis 9/12+ for collection 5.8 x 3.3 cm  Previously 5 x 3 cm.  Peripancreatic fatty infiltration & stranding again noted.  Patient changed to [facility] 9/12 2ry to lack of IV access  Patient doing well on  Omnicef, no adverse side effects.  Drain output <30cc    Sepsis  Septic shock.  Resolved    E. coli bacteremia  Blood cultures 7/31+ for E. coli   2/2 LAC (pan sensitive)   Negative repeat cultures 8/4   Treated.    Acute abdomen  Pneumoperitoneum  S/p diagnostic laparoscopy, laparotomy  EGD 7/31  Findings of cloudy fluid in right upper quadrant, yellow/white  Inflammatory peel over lesser curve of stomach, inferior lobe of liver  No gross perforation identified  Pancreas and retroperitoneum appeared edematous  Intra-Op fluid culture + for E. Coli (pansensitive)    S/p abdominal wound dehiscence  Clean, no purulence per wound care  Cultures 8/17, 8/18-NG.    Leukocytosis   Now resolved wbc 7.8      Gangrene, discoloration of toes  Persist      Acute hypoxic respiratory failure  Re intubated 8/13, extubated 8/19  Initially intubated 7/31, s/p extubation 8/7  CXR  8/20 + + for atelectasis  Resp cultures  8/13+ for light yeast  Apparent C.albicans/ dubliensis        AKI  Cr 4.86 on HD    Coagulopathy  Improving    Thrombocytopenia  resolved      Transaminitis  Improving    Hyperbilirubinemia   Improved      Diabetes type 2  Hyperglycemia  A1c 7.7    Diarrhea  resolved    Obesity  BMI 34.76  Plan  Pt was on ceftriaxone 2 g IV daily   Total antibiotic coverage 6 weeks ending 9/15  Currently on Omnicef p.o. due to lack of IV access.  We will plan to continue Omnicef p.o. x2 more weeks  ending 8 weeks on 9/ 29.    Antimicrobial orders for  discharge  -Omnicef 300 mg p.o. daily end date 9/29  -- Pt will require repeat imaging of liver before conclusion of therapy  -Weekly CBC, CMP-  -Encourage adequate fluids, daily probiotic/yogurt  -ID follow-up - if pt remains in [place] call [phone]   For follow up.  Will follow peripherally.    Possible adverse effects of long term antibiotics are inclusive of but not limited to following  BM suppression, neutropenia , cytopenias , aplastic anemia hemorrhage liver & renal dysfunction/ liver , renal failure  , GI dysfunction- N, V  Diarrhea,C.difficile disease, rash , allergy , anaphylaxis.toxic epidermal necrolysis  Neuro toxicity , seizure disorder  Side effects tend to be more pronounced in the elderly  Pt to report persistent symptoms such as N,V , diarrhea, numbness tingling of extremities  or any other symptoms      May DC from ID standpoint        Extensive review of chart notes, labs, imaging, cultures done  Additionally review of done: Drain output      Patient seen earlier today.  Awake, denies new complaints  related to  infection.Complains of  shoulder pain  Drain output much  less.      History reviewed. No pertinent past medical history.    Past Surgical History:   Procedure Laterality Date    BLADDER SURGERY N/A 06/01/2022    ENDOSCOPY OF ILEAL CONDUIT performed by Guinevere Ferrari, MD at [facility] MA[facility] OR    CT VISCERAL PERCUTANEOUS DRAIN  06/05/2022    CT VISCERAL PERCUTANEOUS DRAIN 06/05/2022 [facility] RAD CT    IR TUNNELED CATHETER PLACEMENT GREATER THAN 5 YEARS  07/01/2022    IR TUNNELED CATHETER PLACEMENT GREATER THAN 5 YEARS 07/01/2022 [facility] [place], APRN - NP [facility] ANGIO IR    LAPAROSCOPY N/A 06/01/2022    LAPAROSCOPY DIAGNOSTIC performed by Guinevere Ferrari, MD at [facility]    LAPAROTOMY N/A 06/01/2022    LAPAROTOMY EXPLORATORY performed by Guinevere Ferrari, MD at [facility]       Allergies   Allergen Reactions    Augmentin [Amoxicillin-Pot Clavulanate] Hives     Tolerated ceftriaxone 06/2022    Codeine       Unknown reaction    Oxycodone Itching       Social Connections: Not on file       No family status information on file.           Review of Systems - Negative except those mentioned in H&P      PHYSICAL EXAM:  General:          Awake, in no distress  EENT:              EOMI. Anicteric sclerae. MMM  Resp:               CTA bilaterally, no wheezing or rales.  No accessory muscle use  CV:                  Regular  rhythm,  No edema  GI:                   Soft, Non distended, Non tender.  +Bowel sounds  Neurologic:      Alert and oriented X 3, normal speech,   Psych:             Good insight. Not anxious nor agitated  Skin:                No rashes.  No jaundice.  Extremities :  No edema, discoloration of  toes++.   Drain+ minimal drainage  Waymon Amato, MD Jerrel Ivory

## 2022-07-17 NOTE — Plan of Care (Signed)
Problem: Safety - Adult  Goal: Free from fall injury  07/17/2022 0249 by Geronimo Running, RN  Outcome: Progressing  07/16/2022 1732 by Evangeline Dakin, RN  Outcome: Progressing     Problem: Respiratory - Adult  Goal: Achieves optimal ventilation and oxygenation  07/17/2022 0249 by Geronimo Running, RN  Outcome: Progressing  07/16/2022 1732 by Evangeline Dakin, RN  Outcome: Progressing  Flowsheets (Taken 07/16/2022 1545)  Achieves optimal ventilation and oxygenation: Assess for changes in respiratory status     Problem: Pain  Goal: Verbalizes/displays adequate comfort level or baseline comfort level  07/17/2022 0249 by Geronimo Running, RN  Outcome: Progressing  07/16/2022 1732 by Evangeline Dakin, RN  Outcome: Progressing  Flowsheets (Taken 07/16/2022 1500)  Verbalizes/displays adequate comfort level or baseline comfort level: Encourage patient to monitor pain and request assistance     Problem: Discharge Planning  Goal: Discharge to home or other facility with appropriate resources  Outcome: Progressing  Flowsheets (Taken 07/16/2022 1944 by Tory Emerald, RN)  Discharge to home or other facility with appropriate resources: Identify barriers to discharge with patient and caregiver     Problem: Chronic Conditions and Co-morbidities  Goal: Patient's chronic conditions and co-morbidity symptoms are monitored and maintained or improved  Outcome: Progressing  Flowsheets (Taken 07/16/2022 1944 by Tory Emerald, RN)  Care Plan - Patient's Chronic Conditions and Co-Morbidity Symptoms are Monitored and Maintained or Improved: Monitor and assess patient's chronic conditions and comorbid symptoms for stability, deterioration, or improvement     Problem: Neurosensory - Adult  Goal: Achieves maximal functionality and self care  07/17/2022 0249 by Geronimo Running, RN  Outcome: Progressing  07/16/2022 1732 by Evangeline Dakin, RN  Outcome: Progressing  Flowsheets (Taken 07/16/2022 1545)  Achieves maximal functionality and self care: Monitor swallowing  and airway patency with patient fatigue and changes in neurological status     Problem: Cardiovascular - Adult  Goal: Maintains optimal cardiac output and hemodynamic stability  Outcome: Progressing  Flowsheets  Taken 07/16/2022 1944 by Tory Emerald, RN  Maintains optimal cardiac output and hemodynamic stability: Monitor blood pressure and heart rate  Taken 07/16/2022 1545 by Evangeline Dakin, RN  Maintains optimal cardiac output and hemodynamic stability: Monitor blood pressure and heart rate  Goal: Absence of cardiac dysrhythmias or at baseline  Outcome: Progressing  Flowsheets (Taken 07/16/2022 1944 by Tory Emerald, RN)  Absence of cardiac dysrhythmias or at baseline: Monitor cardiac rate and rhythm     Problem: Gastrointestinal - Adult  Goal: Maintains or returns to baseline bowel function  Outcome: Progressing  Flowsheets  Taken 07/16/2022 1944 by Tory Emerald, RN  Maintains or returns to baseline bowel function: Assess bowel function  Taken 07/16/2022 1545 by Evangeline Dakin, RN  Maintains or returns to baseline bowel function: Assess bowel function  Goal: Maintains adequate nutritional intake  07/17/2022 0249 by Geronimo Running, RN  Outcome: Progressing  07/16/2022 1732 by Evangeline Dakin, RN  Outcome: Progressing     Problem: Genitourinary - Adult  Goal: Absence of urinary retention  07/17/2022 0249 by Geronimo Running, RN  Outcome: Progressing  [place] (Taken 07/16/2022 1944 by Tory Emerald, RN)  Absence of urinary retention: Assess patient's ability to void and empty bladder  07/16/2022 1732 by Evangeline Dakin, RN  Outcome: Progressing  Flowsheets (Taken 07/16/2022 1545)  Absence of urinary retention: Assess patient's ability to void and empty bladder     Problem: Metabolic/Fluid and Electrolytes - Adult  Goal: Electrolytes maintained within normal limits  Outcome: Progressing  Flowsheets (Taken 07/16/2022 1944  by Tory Emerald, RN)  Electrolytes maintained within normal limits: Monitor labs and assess patient for  signs and symptoms of electrolyte imbalances  Goal: Hemodynamic stability and optimal renal function maintained  Outcome: Progressing  Goal: Glucose maintained within prescribed range  Outcome: Progressing     Problem: Skin/Tissue Integrity - Adult  Goal: Skin integrity remains intact  Outcome: Progressing  Flowsheets  Taken 07/17/2022 0100 by Geronimo Running, RN  Skin Integrity Remains Intact: Monitor for areas of redness and/or skin breakdown  Taken 07/16/2022 1944 by Tory Emerald, RN  Skin Integrity Remains Intact: Monitor for areas of redness and/or skin breakdown  Taken 07/16/2022 1545 by Evangeline Dakin, RN  Skin Integrity Remains Intact: Monitor for areas of redness and/or skin breakdown  Goal: Incisions, wounds, or drain sites healing without S/S of infection  Outcome: Progressing  Goal: Oral mucous membranes remain intact  Outcome: Progressing     Problem: Hematologic - Adult  Goal: Maintains hematologic stability  Outcome: Progressing  Flowsheets (Taken 07/16/2022 1944 by Tory Emerald, RN)  Maintains hematologic stability: Assess for signs and symptoms of bleeding or hemorrhage     Problem: Musculoskeletal - Adult  Goal: Return mobility to safest level of function  Outcome: Progressing  Flowsheets (Taken 07/16/2022 1545 by Evangeline Dakin, RN)  Return Mobility to Safest Level of Function: Assess patient stability and activity tolerance for standing, transferring and ambulating with or without assistive devices  Goal: Maintain proper alignment of affected body part  Outcome: Progressing  Goal: Return ADL status to a safe level of function  Outcome: Progressing     Problem: Skin/Tissue Integrity  Goal: Absence of new skin breakdown  Description: 1.  Monitor for areas of redness and/or skin breakdown  2.  Assess vascular access sites hourly  3.  Every 4-6 hours minimum:  Change oxygen saturation probe site  4.  Every 4-6 hours:  If on nasal continuous positive airway pressure, respiratory therapy assess nares and  determine need for appliance change or resting period.  Outcome: Progressing     Problem: ABCDS Injury Assessment  Goal: Absence of physical injury  Outcome: Progressing     Problem: Occupational Therapy - Adult  Goal: By Discharge: Performs self-care activities at highest level of function for planned discharge setting.  See evaluation for individualized goals.  Description: FUNCTIONAL STATUS PRIOR TO ADMISSION:     ADL Assistance: Independent, Ambulation Assistance: Independent, Transfer Assistance: Independent, Active Driver: Yes     HOME SUPPORT: Patient lived with spouse and was independent.    Occupational Therapy Goals:  Initiated 06/10/2022; Re-Evaluation 06/20/2022.    Weekly Reassessment 06/29/22 - Continue as below. Goal #6 added. Goals reviewed and revised PRN as per 9/7 weekly reevaluation.   Goals reviewed and revised 9-14  1.  Patient will perform grooming at bed level with Moderate Assist within 7 day(s). Continue goal.  2.  Patient will perform self-feeding with Moderate Assist within 7 day(s). Continue goal. Met: upgrade to mod I in 7 days  3.  Patient will perform upper body dressing with Maximal Assist within 7 day(s). Continue goal.   4.  Patient will complete supine>sit with mod assist x2  within 7 day(s). Goal met, change to min A of 1.   5.  Patient will tolerate sitting EOB with fair sitting balance in preparation for ADLs within 7 day(s). Goal  met, change to good.  6.  Patient will participate in BUE therapeutic activity / exercise with Minimal Assist within 7 day(s). Goal  met, change to S distally and min A proximally within 7 days. met  7.  Patient will participate in cog screening to clarify discharge planning within 7 days      07/16/2022 1656 by [name], OT  Outcome: Progressing     Problem: Safety - Medical Restraint  Goal: Remains free of injury from restraints (Restraint for Interference with Medical Device)  Description: INTERVENTIONS:  1. Determine that other, less  restrictive measures have been tried or would not be effective before applying the restraint  2. Evaluate the patient's condition at the time of restraint application  3. Inform patient/family regarding the reason for restraint  4. Q2H: Monitor safety, psychosocial status, comfort, nutrition and hydration  Outcome: Progressing

## 2022-07-17 NOTE — Progress Notes (Addendum)
0730: Bedside and Verbal shift change report given to Evangeline Dakin, RN  (oncoming nurse) by Alanson Puls (offgoing nurse). Report included the following information Nurse Handoff Report, Index, ED Encounter Summary, Intake/Output, MAR, Recent Results, and Cardiac Rhythm NSR .      0720: Pt off floor to dialysis, VS not yet assessed, assessment not yet completed.     1055: Pt returned to floor from dialysis, BMP collected during dialysis.     1111: Sacral wound care completed.     End of Shift Note    Bedside shift change report given to RN (oncoming nurse) by Evangeline Dakin, RN (offgoing nurse).  Report included the following information SBAR, Kardex, ED Summary, Intake/Output, MAR, Recent Results, and Cardiac Rhythm NSR    Shift worked:  [phone]     Shift summary and any significant changes:     Pt off floor for dialysis today, tolerated well    Pt voided x1 this shift- unmemorable    Wound care RN completed wound vac dressing change    RUQ accordion drain output: 0 mL     Concerns for physician to address:  none     Zone phone for oncoming shift:          Activity:     Number times ambulated in hallways past shift: 0  Number of times OOB to chair past shift: 0    Cardiac:   Cardiac Monitoring: Yes           Access:  Current line(s): HD access     Genitourinary:   Urinary status: oliguric and external catheter    Respiratory:      Chronic home O2 use?: NO  Incentive spirometer at bedside: NO       GI:     Current diet:  ADULT ORAL NUTRITION SUPPLEMENT; Breakfast, Lunch, Dinner; Renal Oral Supplement  DIET ONE TIME MESSAGE;  DIET ONE TIME MESSAGE;  ADULT DIET; Regular; 5 carb choices (75 gm/meal); Low Phosphorus (Less than 1000 mg)  Passing flatus: YES  Tolerating current diet: YES       Pain Management:   Patient states pain is manageable on current regimen: YES    Skin:     Interventions: specialty bed, float heels, increase time out of bed, foam dressing, PT/OT consult, and internal/external urinary  devices    Patient Safety:  Fall Score:    Interventions: bed/chair alarm, pt to call before getting OOB, and stay with me (per policy)       Length of Stay:  Expected LOS: 52  Actual LOS: 46      Evangeline Dakin, RN

## 2022-07-17 NOTE — Care Coordination-Inpatient (Addendum)
Transition of Care Plan:     RUR: 19%   Prior Level of Functioning: independent  Disposition: LTACH vs IPR vs SNF in [place] of [place] with HD set up- will need auth  If SNF or IPR: Date FOC offered: 06/30/22 LTACH and ongoing  Date FOC received:   Accepting facility: TBD  Date authorization started with reference number:   Date authorization received and expires:   Follow up appointments:   DME needed: per faciltiy  Transportation at discharge: TBD likely BLS  IM/IMM Medicare/Tricare letter given: will need prior to discharge  Is patient a Veteran and connected with VA?               If yes, was [employer] transfer form completed and VA notified?   Caregiver Contact: Virat Prather - Wife - [phone]  Discharge Caregiver contacted prior to discharge? no  Care Conference needed?   Barriers to discharge: new HD, wound vac/wound care orders, placement and insurance authorization, transportation for OP HD, need lab ordered for OPHD setup ( Hep B Total Core Antibody)    CM to send referral to [place] - ph# [phone] fax# [phone]- denied - patient too low level    CM sent 16 referrals to SNF facilities in [place] area via [place] yesterday - outcomes pending.      CM placed a call to [facility] [phone] was directed to assigned Care Coordinator with Madolyn Frieze x [phone] - left VM and awaiting call back for assistance with SNF's in [place] area that have in house OP HD.  Awaiting call back at this time.  Chales Abrahams, MSW      [facility] - no beds at this time and patient would need to be able to sit up in w/c to go out of hospital for OP HD at their location.    CM left VM with [facility] admissions at [phone] to inquire if referral received via [place]. Unable to accept per careport    CM noted last Hep Panel labs drawn on 06/29/2022 - will still need Hep B Total Core Antibody lab results   When discharge place is determined will also  need a Covid test. Chales Abrahams, MSW    CM placed calls to 14 SNF facilities and left VM to SNF admissions in the [place] area.     CM unable to leave VM for admissions with [facility]- ph# [phone] fax# [phone] Lorenza Chick in admissions) - referral faxed awaiting response.     Only potential placement in [place] is [place] ph# [phone] fax# [phone] spoke with [facility] and once referral received they will review- they are connected with an OP HD clinic and may assist with transportation. Will await response at this time. Chales Abrahams, MSW

## 2022-07-17 NOTE — Plan of Care (Signed)
Problem: Safety - Adult  Goal: Free from fall injury  07/17/2022 1240 by Evangeline Dakin, RN  Outcome: Progressing  07/17/2022 0249 by Geronimo Running, RN  Outcome: Progressing     Problem: Respiratory - Adult  Goal: Achieves optimal ventilation and oxygenation  07/17/2022 1240 by Evangeline Dakin, RN  Outcome: Progressing  07/17/2022 0249 by Geronimo Running, RN  Outcome: Progressing     Problem: Pain  Goal: Verbalizes/displays adequate comfort level or baseline comfort level  07/17/2022 1240 by Evangeline Dakin, RN  Outcome: Progressing  Flowsheets (Taken 07/17/2022 1100 by Lita Mains)  Verbalizes/displays adequate comfort level or baseline comfort level: Encourage patient to monitor pain and request assistance  07/17/2022 0249 by Geronimo Running, RN  Outcome: Progressing     Problem: Discharge Planning  Goal: Discharge to home or other facility with appropriate resources  07/17/2022 0249 by Geronimo Running, RN  Outcome: Progressing  Flowsheets (Taken 07/16/2022 1944 by Tory Emerald, RN)  Discharge to home or other facility with appropriate resources: Identify barriers to discharge with patient and caregiver     Problem: Chronic Conditions and Co-morbidities  Goal: Patient's chronic conditions and co-morbidity symptoms are monitored and maintained or improved  07/17/2022 0249 by Geronimo Running, RN  Outcome: Progressing  Flowsheets (Taken 07/16/2022 1944 by Tory Emerald, RN)  Care Plan - Patient's Chronic Conditions and Co-Morbidity Symptoms are Monitored and Maintained or Improved: Monitor and assess patient's chronic conditions and comorbid symptoms for stability, deterioration, or improvement     Problem: Neurosensory - Adult  Goal: Achieves maximal functionality and self care  07/17/2022 0249 by Geronimo Running, RN  Outcome: Progressing     Problem: Cardiovascular - Adult  Goal: Maintains optimal cardiac output and hemodynamic stability  07/17/2022 0249 by Geronimo Running, RN  Outcome: Progressing  Flowsheets  Taken 07/16/2022  1944 by Tory Emerald, RN  Maintains optimal cardiac output and hemodynamic stability: Monitor blood pressure and heart rate  Taken 07/16/2022 1545 by Evangeline Dakin, RN  Maintains optimal cardiac output and hemodynamic stability: Monitor blood pressure and heart rate  Goal: Absence of cardiac dysrhythmias or at baseline  07/17/2022 0249 by Geronimo Running, RN  Outcome: Progressing  Flowsheets (Taken 07/16/2022 1944 by Tory Emerald, RN)  Absence of cardiac dysrhythmias or at baseline: Monitor cardiac rate and rhythm     Problem: Gastrointestinal - Adult  Goal: Maintains or returns to baseline bowel function  07/17/2022 0249 by Geronimo Running, RN  Outcome: Progressing  Flowsheets  Taken 07/16/2022 1944 by Tory Emerald, RN  Maintains or returns to baseline bowel function: Assess bowel function  Taken 07/16/2022 1545 by Evangeline Dakin, RN  Maintains or returns to baseline bowel function: Assess bowel function  Goal: Maintains adequate nutritional intake  07/17/2022 0249 by Geronimo Running, RN  Outcome: Progressing     Problem: Genitourinary - Adult  Goal: Absence of urinary retention  07/17/2022 1240 by Evangeline Dakin, RN  Outcome: Progressing  07/17/2022 0249 by Geronimo Running, RN  Outcome: Progressing  Flowsheets (Taken 07/16/2022 1944 by Tory Emerald, RN)  Absence of urinary retention: Assess patient's ability to void and empty bladder     Problem: Metabolic/Fluid and Electrolytes - Adult  Goal: Electrolytes maintained within normal limits  07/17/2022 0249 by Geronimo Running, RN  Outcome: Progressing  Flowsheets (Taken 07/16/2022 1944 by Tory Emerald, RN)  Electrolytes maintained within normal limits: Monitor labs and assess patient for signs and symptoms of electrolyte imbalances  Goal: Hemodynamic stability and optimal renal function maintained  07/17/2022 0249  by Estella Husk, RN  Outcome: Progressing  Goal: Glucose maintained within prescribed range  07/17/2022 0249 by Estella Husk, RN  Outcome: Progressing     Problem:  Skin/Tissue Integrity - Adult  Goal: Skin integrity remains intact  07/17/2022 0249 by Estella Husk, RN  Outcome: Progressing  Flowsheets  Taken 07/17/2022 0100 by Estella Husk, RN  Skin Integrity Remains Intact: Monitor for areas of redness and/or skin breakdown  Taken 07/16/2022 1944 by Lavenia Atlas, RN  Skin Integrity Remains Intact: Monitor for areas of redness and/or skin breakdown  Taken 07/16/2022 1545 by Nadara Mustard, RN  Skin Integrity Remains Intact: Monitor for areas of redness and/or skin breakdown  Goal: Incisions, wounds, or drain sites healing without S/S of infection  07/17/2022 0249 by Estella Husk, RN  Outcome: Progressing  Goal: Oral mucous membranes remain intact  07/17/2022 0249 by Estella Husk, RN  Outcome: Progressing     Problem: Hematologic - Adult  Goal: Maintains hematologic stability  07/17/2022 0249 by Estella Husk, RN  Outcome: Progressing  Flowsheets (Taken 07/16/2022 1944 by Lavenia Atlas, RN)  Maintains hematologic stability: Assess for signs and symptoms of bleeding or hemorrhage     Problem: Musculoskeletal - Adult  Goal: Return mobility to safest level of function  07/17/2022 0249 by Estella Husk, RN  Outcome: Progressing  Flowsheets (Taken 07/16/2022 1545 by Nadara Mustard, RN)  Return Mobility to Safest Level of Function: Assess patient stability and activity tolerance for standing, transferring and ambulating with or without assistive devices  Goal: Maintain proper alignment of affected body part  07/17/2022 0249 by Estella Husk, RN  Outcome: Progressing  Goal: Return ADL status to a safe level of function  07/17/2022 0249 by Estella Husk, RN  Outcome: Progressing     Problem: Skin/Tissue Integrity  Goal: Absence of new skin breakdown  Description: 1.  Monitor for areas of redness and/or skin breakdown  2.  Assess vascular access sites hourly  3.  Every 4-6 hours minimum:  Change oxygen saturation probe site  4.  Every 4-6 hours:  If on nasal continuous positive airway  pressure, respiratory therapy assess nares and determine need for appliance change or resting period.  07/17/2022 0249 by Estella Husk, RN  Outcome: Progressing     Problem: ABCDS Injury Assessment  Goal: Absence of physical injury  07/17/2022 0249 by Estella Husk, RN  Outcome: Progressing     Problem: Safety - Medical Restraint  Goal: Remains free of injury from restraints (Restraint for Interference with Medical Device)  Description: INTERVENTIONS:  1. Determine that other, less restrictive measures have been tried or would not be effective before applying the restraint  2. Evaluate the patient's condition at the time of restraint application  3. Inform patient/family regarding the reason for restraint  4. Q2H: Monitor safety, psychosocial status, comfort, nutrition and hydration  07/17/2022 0249 by Estella Husk, RN  Outcome: Progressing

## 2022-07-17 NOTE — Progress Notes (Signed)
NAME: Jaime [name]        DOB:  1946-07-23        MRN:  [id number]                     Assessment   :                                               Plan:  AKI  Pancreatitis  Hyponatremia  DM  Anemia  Toe ischemia KRT initiated 7/31; now on MWF schedule; perm cath 8/30.    Seen on HD at 0930.  BP stable.    Needs outpatient HD placement under AKI diagnosis - CM working on options (here or back in [place])    Watch for renal recovery, but none so far.    H/H not at goal.  Continue ESA.    D/W pt    Will see again Monday.  Please call if any questions.              Subjective:     Chief Complaint:  "I feel fine."  No N/V.  No dyspnea.  No pain.          Review of Systems:    Symptom Y/N Comments  Symptom Y/N Comments   Fever/Chills    Chest Pain     Poor Appetite    Edema     Cough    Abdominal Pain     Sputum    Joint Pain     SOB/DOE    Pruritis/Rash     Nausea/vomit    Tolerating PT/OT     Diarrhea    Tolerating Diet     Constipation    Other       Could not obtain due to:      Objective:     VITALS:   Last 24hrs VS reviewed since prior progress note. Most recent are:  Vitals:    07/17/22 1030   BP: (!) 150/82   Pulse: 95   Resp:    Temp:    SpO2:      No intake or output data in the 24 hours ending 07/17/22 1045     Telemetry Reviewed:     PHYSICAL EXAM:  General: NAD  trace edema  R TDC intact  B/l toes- dry gangrene      Lab Data Reviewed: (see below)    Medications Reviewed: (see below)    PMH/SH reviewed - no change compared to H&P  ________________________________________________________________________  Care Plan discussed with:  Patient     Family      RN     Care Manager                    Consultant:          Comments   >50% of visit spent in counseling and coordination of care       ________________________________________________________________________  Marina Gravel, MD     Procedures: see electronic medical records for all  procedures/Xrays and details which  were not copied into this note but were reviewed prior to creation of Plan.      LABS:  Recent Labs     07/15/22  0805   WBC 8.1   HGB 8.1*   HCT 26.0*   PLT 153  Recent Labs     07/15/22  0805 07/17/22  0736   NA 133* 137   K 4.0 3.6   CL 100 102   CO2 29 31   BUN 26* 22*   MG 2.8* 2.8*   PHOS 4.2  4.0 2.8         MEDICATIONS:  Current Facility-Administered Medications   Medication Dose Route Frequency    diphenhydrAMINE (BENADRYL) capsule 25 mg  25 mg Oral Q6H PRN    cefdinir (OMNICEF) capsule 300 mg  300 mg Oral Daily    sevelamer (RENVELA) packet 2.4 g  2.4 g Oral TID WC    heparin (porcine) 1000 UNIT/ML injection 1,800 Units  1,800 Units IntraCATHeter PRN    And    heparin (porcine) 1000 UNIT/ML injection 1,800 Units  1,800 Units IntraCATHeter PRN    guaiFENesin (ROBITUSSIN) 100 MG/5ML liquid 200 mg  200 mg Oral Q4H PRN    heparin (porcine) injection 5,000 Units  5,000 Units SubCUTAneous 3 times per day    therapeutic multivitamin-minerals 1 tablet  1 tablet Per NG tube Daily    amiodarone (CORDARONE) tablet 200 mg  200 mg Per NG tube Daily    acidophilus probiotic capsule 1 capsule  1 capsule Oral Daily    lidocaine 2 % injection 20 mL  20 mL IntraDERmal Once    collagenase ointment   Topical Daily    epoetin alfa-epbx (RETACRIT) injection 6,000 Units  6,000 Units SubCUTAneous Once per day on Mon Wed Fri    fentaNYL (SUBLIMAZE) injection 25 mcg  25 mcg IntraVENous Q1H PRN    oxyCODONE (ROXICODONE) immediate release tablet 5 mg  5 mg Oral Q4H PRN    midodrine (PROAMATINE) tablet 5 mg  5 mg Oral Daily PRN    insulin lispro (HUMALOG) injection vial 0-4 Units  0-4 Units SubCUTAneous 4 times per day    bisacodyl (DULCOLAX) suppository 10 mg  10 mg Rectal Daily PRN    albumin human 25% IV solution 25 g  25 g IntraVENous PRN    sodium chloride 0.9 % bolus 100 mL  100 mL IntraVENous PRN    balsum peru-castor oil (VENELEX) ointment   Topical BID    glucose chewable tablet 16 g   4 tablet Oral PRN    dextrose bolus 10% 125 mL  125 mL IntraVENous PRN    Or    dextrose bolus 10% 250 mL  250 mL IntraVENous PRN    glucagon (rDNA) injection 1 mg  1 mg SubCUTAneous PRN    dextrose 10 % infusion   IntraVENous Continuous PRN    sodium chloride flush 0.9 % injection 5-40 mL  5-40 mL IntraVENous 2 times per day    sodium chloride flush 0.9 % injection 5-40 mL  5-40 mL IntraVENous PRN    0.9 % sodium chloride infusion   IntraVENous PRN    acetaminophen (TYLENOL) tablet 650 mg  650 mg Oral Q6H PRN    Or    acetaminophen (TYLENOL) suppository 650 mg  650 mg Rectal Q6H PRN    ondansetron (ZOFRAN) injection 4 mg  4 mg IntraVENous Q6H PRN

## 2022-07-17 NOTE — Progress Notes (Signed)
Hospitalist Progress Note    NAME:   Jaime [name]   DOB: 1945/12/22   MRN: [id number]     Date/Time: 07/17/2022 7:47 AM  Patient PCP: No primary care provider on file.    Estimated discharge date: 9/18   Barriers: Placement     For reference :   76 y/m with prolonged hospitalization, was admitted to surgical service on 7/31 for perforated hollow viscus underwent emergent laparotomy/ septic shock d/t peritonitis, was intubated and extubated x 2. Also had AKI needing CVVH, now on HD. Also has bilateral foot gangrene. Was transferred to hospitalist service on 8/21.     Assessment / Plan:  #Septic shock- resolved   D/t perforated viscus  S/p Laparotomy 7/31, with wound vac drainage   Liver abscess s/p IR drainage, hepatic drain to be removed  Bacteremia     -Discussed with case management regarding discharge disposition - wife needs to set up financial assistance for transfer of patient to [place] . Other option could be to go to an SNF in [place] [facility] ) .     9/13:on Omnicef p.o. due to lack of IV access.   plan to continue Omnicef p.o. x2 more weeks  ending 8 weeks on 9/ 29.  9/15: Patient stable       Antimicrobial orders for discharge  -Omnicef 300 mg p.o. daily end date 9/29  -- Pt will require repeat imaging of liver before conclusion of therapy  -Weekly CBC, CMP-  -Encourage adequate fluids, daily probiotic/yogurt  -ID follow-up - if pt remains in [place] call [phone]      #Acute hypoxemic respiratory failure:  Intubated 7/31, extubation 8/7  -Reintubated 8/13, extubated on 8/19  -Currently on 2 liters [place]  -Weaned off o2      #Dysphagia:  -Ongoing SLP evaluation-patient tolerating small diet .   -Aspiration on FEES study  -Follow-up SLP evaluation     #Acute kidney injury:  -Now on HD  -Continue with HD  -Nephrology following  -Case management informed the need to set up HD at SNF prior to discharge .       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 No active complaints. ".  Discussed with RN events overnight.       Objective:     VITALS:   Last 24hrs VS reviewed since prior progress note. Most recent are:  Patient Vitals for the past 24 hrs:   BP Temp Temp src Pulse Resp SpO2 Weight   07/17/22 0600 -- -- -- -- -- -- 98.1 kg (216 lb 4.3 oz)   07/17/22 0400 (!) 146/71 98.5 F (36.9 C) Axillary 100 18 97 % --   07/17/22 0034 (!) 142/86 97.6 F (36.4 C) Axillary 94 18 -- --  07/16/22 1944 (!) 145/96 97.3 F (36.3 C) Axillary 100 -- 93 % --   07/16/22 1500 (!) 144/70 98.3 F (36.8 C) Oral 97 18 94 % --   07/16/22 1130 (!) 149/73 98 F (36.7 C) Oral 95 18 95 % --       No intake or output data in the 24 hours ending 07/17/22 0747       I had a face to face encounter and independently examined this patient on 07/17/2022, as outlined below:  PHYSICAL EXAM:  General: Alert, cooperative  EENT:  EOMI. Anicteric sclerae..Rt. hd catn - jugular   Resp:  CTA bilaterally, no wheezing or rales.  No accessory muscle use  CV:  Regular  rhythm,  No edema  GI:  Soft, Non distended, Non tender.  +Bowel sounds, wound vac in place , drain on rt upper quadrant   Neurologic:  Alert and oriented X 3, normal speech,   Psych:   Good insight. Not anxious nor agitated  Foot :  bilateral feet - gangrene of first second and third toes , not yet full demarcation     Reviewed most current lab test results and cultures  YES  Reviewed most current radiology  test results   YES  Review and summation of old records today    NO  Reviewed patient's current orders and MAR    YES  PMH/SH reviewed - no change compared to H&P  ________________________________________________________________________  Care Plan discussed with:    Comments   Patient x    Family      RN x    Care Manager     Consultant                        Multidiciplinary team rounds were held today with case manager, nursing, pharmacist and Occupational psychologist.  Patient's plan of care was discussed; medications were reviewed and discharge planning was addressed.     ________________________________________________________________________  Total NON critical care TIME:  15  Minutes    Total CRITICAL CARE TIME Spent:   Minutes non procedure based      Comments   >50% of visit spent in counseling and coordination of care     ________________________________________________________________________  Tad Moore, MD     Procedures: see electronic medical records for all procedures/Xrays and details which were not copied into this note but were reviewed prior to creation of Plan.      LABS:  I reviewed today's most current labs and imaging studies.  Pertinent labs include:  Recent Labs     07/15/22  0805   WBC 8.1   HGB 8.1*   HCT 26.0*   PLT 153     Recent Labs     07/15/22  0805   NA 133*   K 4.0   CL 100   CO2 29   GLUCOSE 93   BUN 26*   CREATININE 4.26*   CALCIUM 8.2*   MG 2.8*   PHOS 4.2  4.0   LABALBU 2.1*       Signed: Tad Moore, MD

## 2022-07-17 NOTE — Progress Notes (Signed)
Physical Therapy:  Patient currently OTF for HD, unavailable for PT session. Will defer and continue to follow.    Teiara Baria Laural Benes PT, DPT

## 2022-07-17 NOTE — Progress Notes (Signed)
Braden Score 13. All of the following interventions have been implemented to prevent pressure injury:    SKIN ASSESSMENT (S)  Dual skin assessment completed at shift change: Yes  Name of second RN who completed Dual Skin Assessment: Caryl Pina, RN  Picture of wound uploaded to EMR: Yes  Venelex ordered and given per protocol: Yes  Wound care consulted if wounds present: Yes    SURFACE (S)  [employer] air pump or specialty bed ordered: Yes  Type of bed: Air bed  Only white chux used with specialty surfaces (no green chux used): Yes  Waffle cushion used for chair positioning: NA    KEEP MOVING (K)  Mobility status (Bedrest, Chairbound, UWA x 1 assist , 2 assist, Max assist): Max assist  Q2 hour turns documented: Yes  Refusals to turn and education provided documented: NA  Device used to float heels: Heels up  PT/OT consulted: Yes    INCONTINENCE (I)  Incontinence status assessed Q2 hours: Yes  External catheter in use: Yes  Barrier cream in use: Yes    NUTRITION (N)  I/O's documented every 8 hours: Yes  Oral supplements ordered if appropriate: Yes  Nutrition services consulted: Yes    All concerns about new DTI's must be escalated directly to attending MD, charge nurse, [place] and nurse director.

## 2022-07-18 LAB — EKG 12-LEAD
Atrial Rate: 106 {beats}/min
P Axis: 68 degrees
P-R Interval: 168 ms
Q-T Interval: 412 ms
QRS Duration: 154 ms
QTc Calculation (Bazett): 547 ms
R Axis: -33 degrees
T Axis: 136 degrees
Ventricular Rate: 106 {beats}/min

## 2022-07-18 LAB — POCT GLUCOSE
POC Glucose: 111 mg/dL (ref 65–117)
POC Glucose: 100 mg/dL (ref 65–117)
POC Glucose: 106 mg/dL (ref 65–117)
POC Glucose: 125 mg/dL — ABNORMAL HIGH (ref 65–117)
POC Glucose: 134 mg/dL — ABNORMAL HIGH (ref 65–117)

## 2022-07-18 MED ORDER — OXYCODONE HCL 5 MG PO TABS
5 MG | ORAL | Status: DC | PRN
Start: 2022-07-18 — End: 2022-08-03
  Administered 2022-07-27: 22:00:00 5 mg via ORAL

## 2022-07-18 NOTE — Progress Notes (Signed)
Hospitalist Progress Note    NAME:   Jaime [name]   DOB: September 07, 1946   MRN: [id number]     Date/Time: 07/18/2022 8:02 AM  Patient PCP: No primary care provider on file.    Estimated discharge date: 9/18   Barriers: Placement     For reference :   76 y/m with prolonged hospitalization, was admitted to surgical service on 7/31 for perforated hollow viscus underwent emergent laparotomy/ septic shock d/t peritonitis, was intubated and extubated x 2. Also had AKI needing CVVH, now on HD. Also has bilateral foot gangrene. Was transferred to hospitalist service on 8/21.     Assessment / Plan:  #Septic shock- resolved   D/t perforated viscus  S/p Laparotomy 7/31, with wound vac drainage   Liver abscess s/p IR drainage, hepatic drain to be removed  Bacteremia     -Discussed with case management regarding discharge disposition - wife needs to set up financial assistance for transfer of patient to [place] . Other option could be to go to an SNF in [place] [facility] ) .     9/13:on Omnicef p.o. due to lack of IV access.   plan to continue Omnicef p.o. x2 more weeks  ending 8 weeks on 9/ 29.  9/15: Patient stable   9/16: Medically stable   Awaiting placement   continue Omnicef p.o.until  on 9/ 29.    Antimicrobial orders for discharge  -Omnicef 300 mg p.o. daily end date 9/29  -- Pt will require repeat imaging of liver before conclusion of therapy  -Weekly CBC, CMP-  -Encourage adequate fluids, daily probiotic/yogurt  -ID follow-up - if pt remains in [place] call [phone]      #Acute hypoxemic respiratory failure:  Intubated 7/31, extubation 8/7  -Reintubated 8/13, extubated on 8/19  -Currently on 2 liters [place]  -Weaned off o2      #Dysphagia:  -Ongoing SLP evaluation-patient tolerating small diet .   -Aspiration on FEES study  -Follow-up SLP evaluation     #Acute kidney injury:  -Now on HD  -Continue with HD  -Nephrology following  -Case management informed the need to set up HD at SNF prior to discharge .       #Bilateral  lower extremity critical limb ischemia  Gangrene both feet  -Podiatry and vascular surgery following  Cleared by podiatry for demarcation+ autoamputation     #Diabetes mellitus:  -Sliding scale and lantus  -Monitor FS  -Diabetes team following     #Sacral wound:  -Wound care following            Medical Decision Making:   I personally reviewed labs:  I personally reviewed imaging:  I personally reviewed EKG:  Toxic drug monitoring:   Discussed case with: Patient , RN        Code Status: Full   DVT Prophylaxis: Heparin   GI Prophylaxis:    Subjective:     Chief Complaint / Reason for Physician Visit  "Follow up case. No active complaints. ".  Discussed with RN events overnight.       Objective:     VITALS:   Last 24hrs VS reviewed since prior progress note. Most recent are:  Patient Vitals for the past 24 hrs:   BP Temp Temp src Pulse Resp SpO2   07/18/22 0342 131/64 97.4 F (36.3 C) Oral 88 -- --   07/17/22 2327 136/60 99.4 F (37.4 C) Oral 93 16 --   07/17/22 1950 133/81 100.4 F (38  C) Axillary (!) 105 -- --   07/17/22 1551 (!) 143/66 98.4 F (36.9 C) Oral (!) 109 -- 92 %   07/17/22 1100 (!) 150/71 98.9 F (37.2 C) Oral 95 18 97 %   07/17/22 1036 (!) 157/78 97.7 F (36.5 C) -- 91 18 --   07/17/22 1030 (!) 150/82 -- -- 95 -- --   07/17/22 1015 (!) 144/83 -- -- 93 -- --   07/17/22 1000 (!) 148/81 -- -- 93 -- --   07/17/22 0945 (!) 145/87 -- -- 91 -- --   07/17/22 0930 (!) 150/82 -- -- 92 -- --   07/17/22 0915 (!) 150/84 -- -- 92 -- --   07/17/22 0900 (!) 149/83 -- -- 88 -- --   07/17/22 0845 (!) 154/84 -- -- 92 -- --   07/17/22 0830 (!) 148/86 -- -- 92 -- --   07/17/22 0815 (!) 112/59 -- -- 88 -- --         Intake/Output Summary (Last 24 hours) at 07/18/2022 0802  Last data filed at 07/18/2022 0700  Gross per 24 hour   Intake 740 ml   Output 1150 ml   Net -410 ml          I had a face to face encounter and independently examined this patient on 07/18/2022, as outlined below:  PHYSICAL EXAM:  General: Alert,  cooperative  EENT:  EOMI. Anicteric sclerae..Rt. hd catn - jugular   Resp:  CTA bilaterally, no wheezing or rales.  No accessory muscle use  CV:  Regular  rhythm,  No edema  GI:  Soft, Non distended, Non tender.  +Bowel sounds, wound vac in place , drain on rt upper quadrant   Neurologic:  Alert and oriented X 3, normal speech,   Psych:   Good insight. Not anxious nor agitated  Foot :  bilateral feet - gangrene of most of the toes  , not yet full demarcation     Reviewed most current lab test results and cultures  YES  Reviewed most current radiology test results   YES  Review and summation of old records today    NO  Reviewed patient's current orders and MAR    YES  PMH/SH reviewed - no change compared to H&P  ________________________________________________________________________  Care Plan discussed with:    Comments   Patient x    Family      RN x    Care Manager     Consultant                        Multidiciplinary team rounds were held today with case manager, nursing, pharmacist and Higher education careers adviser.  Patient's plan of care was discussed; medications were reviewed and discharge planning was addressed.     ________________________________________________________________________  Total NON critical care TIME:  15  Minutes    Total CRITICAL CARE TIME Spent:   Minutes non procedure based      Comments   >50% of visit spent in counseling and coordination of care     ________________________________________________________________________  Jerilynn Birkenhead, MD     Procedures: see electronic medical records for all procedures/Xrays and details which were not copied into this note but were reviewed prior to creation of Plan.      LABS:  I reviewed today's most current labs and imaging studies.  Pertinent labs include:  Recent Labs     07/15/22  0805   WBC 8.1   HGB  8.1*   HCT 26.0*   PLT 153     Recent Labs     07/15/22  0805 07/17/22  0736   NA 133* 137   K 4.0 3.6   CL 100 102   CO2 29 31   GLUCOSE 93 148*    BUN 26* 22*   CREATININE 4.26* 3.94*   CALCIUM 8.2* 8.3*   MG 2.8* 2.8*   PHOS 4.2  4.0 2.8   LABALBU 2.1*  --        Signed: Jerilynn Birkenhead, MD

## 2022-07-19 LAB — POCT GLUCOSE
POC Glucose: 158 mg/dL — ABNORMAL HIGH (ref 65–117)
POC Glucose: 137 mg/dL — ABNORMAL HIGH (ref 65–117)
POC Glucose: 126 mg/dL — ABNORMAL HIGH (ref 65–117)
POC Glucose: 145 mg/dL — ABNORMAL HIGH (ref 65–117)
POC Glucose: 161 mg/dL — ABNORMAL HIGH (ref 65–117)

## 2022-07-19 LAB — EXTRA TUBES HOLD

## 2022-07-19 LAB — POST EXPOSURE PROFILE: HIV 1/O/2 Abs, Qual: NONREACTIVE

## 2022-07-19 MED FILL — RENVELA 2.4 G PO PACK: 2.4 g | ORAL | Qty: 2.4

## 2022-07-19 NOTE — Progress Notes (Signed)
Hospitalist Progress Note    NAME:   Jaime [name]   DOB: 24-Mar-1946   MRN: [id number]     Date/Time: 07/19/2022 8:11 AM  Patient PCP: No primary care provider on file.    Estimated discharge date: 9/18   Barriers: Placement     For reference :   76 y/m with prolonged hospitalization, was admitted to surgical service on 7/31 for perforated hollow viscus underwent emergent laparotomy/ septic shock d/t peritonitis, was intubated and extubated x 2. Also had AKI needing CVVH, now on HD. Also has bilateral foot gangrene. Was transferred to hospitalist service on 8/21.     Assessment / Plan:  #Septic shock- resolved   D/t perforated viscus  S/p Laparotomy 7/31, with wound vac drainage   Liver abscess s/p IR drainage, hepatic drain to be removed  Bacteremia     -Discussed with case management regarding discharge disposition - wife needs to set up financial assistance for transfer of patient to [place] . Other option could be to go to an SNF in [place] [facility] ) .     9/13:on Omnicef p.o. due to lack of IV access.   plan to continue Omnicef p.o. x2 more weeks  ending 8 weeks on 9/ 29.  9/15: Patient stable   9/16: Medically stable   Awaiting placement   continue Omnicef p.o.until  on 9/ 29.  9/17  2 episodes of fever 100.4-relieved with Tylenol  No gross symptoms of infection  Examination unremarkable  Patient on on antibiotics-we will monitor for the fever  If persistent high-grade fever will start work-up  ID following    Antimicrobial orders for discharge  -Omnicef 300 mg p.o. daily end date 9/29  -- Pt will require repeat imaging of liver before conclusion of therapy  -Weekly CBC, CMP-  -Encourage adequate fluids, daily probiotic/yogurt  -ID follow-up - if pt remains in [place] call [phone]      #Acute hypoxemic respiratory failure:  Intubated 7/31, extubation 8/7  -Reintubated 8/13, extubated on 8/19  -Currently on 2 liters [place]  -Weaned off o2      #Dysphagia:  -Ongoing SLP evaluation-patient tolerating small  diet .   -Aspiration on FEES study  -Follow-up SLP evaluation     #Acute kidney injury:  -Now on HD  -Continue with HD  -Nephrology following  -Case management informed the need to set up HD at SNF prior to discharge .       #Bilateral lower extremity critical limb ischemia  Gangrene both feet  -Podiatry and vascular surgery following  Cleared by podiatry for demarcation+ autoamputation     #Diabetes mellitus:  -Sliding scale and lantus  -Monitor FS  -Diabetes team following     #Sacral wound:  -Wound care following            Medical Decision Making:   I personally reviewed labs:  I personally reviewed imaging:  I personally reviewed EKG:  Toxic drug monitoring:   Discussed case with: Patient , RN        Code Status: Full   DVT Prophylaxis: Heparin   GI Prophylaxis:    Subjective:     Chief Complaint / Reason for Physician Visit  "Follow up case. No active complaints. ".  Discussed with RN events overnight.       Objective:     VITALS:   Last 24hrs VS reviewed since prior progress note. Most recent are:  Patient Vitals for the past 24 hrs:   BP  Temp Temp src Pulse Resp SpO2 Weight   07/19/22 0600 -- -- -- -- -- -- 96.4 kg (212 lb 8.4 oz)   07/19/22 0336 133/87 98.5 F (36.9 C) Oral 90 -- 92 % --   07/18/22 2256 139/69 100.4 F (38 C) Axillary 98 16 -- --   07/18/22 1952 (!) 146/82 100.4 F (38 C) Axillary -- -- -- --   07/18/22 1510 139/72 98.1 F (36.7 C) -- 98 -- 96 % --   07/18/22 1122 (!) 132/96 97.9 F (36.6 C) -- 94 -- 94 % --         Intake/Output Summary (Last 24 hours) at 07/19/2022 0811  Last data filed at 07/19/2022 0645  Gross per 24 hour   Intake --   Output 350 ml   Net -350 ml          I had a face to face encounter and independently examined this patient on 07/19/2022, as outlined below:  PHYSICAL EXAM:  General: Alert, cooperative  EENT:  EOMI. Anicteric sclerae..Rt. hd catn - jugular   Resp:  CTA bilaterally, no wheezing or rales.  No accessory muscle use  CV:  Regular  rhythm,  No  edema  GI:  Soft, Non distended, Non tender.  +Bowel sounds, wound vac in place , drain on rt upper quadrant   Neurologic:  Alert and oriented X 3, normal speech,   Psych:   Good insight. Not anxious nor agitated  Foot :  bilateral feet - gangrene of most of the toes  , not yet full demarcation     Reviewed most current lab test results and cultures  YES  Reviewed most current radiology test results   YES  Review and summation of old records today    NO  Reviewed patient's current orders and MAR    YES  PMH/SH reviewed - no change compared to H&P  ________________________________________________________________________  Care Plan discussed with:    Comments   Patient x    Family      RN x    Care Manager     Consultant                        Multidiciplinary team rounds were held today with case manager, nursing, pharmacist and Occupational psychologist.  Patient's plan of care was discussed; medications were reviewed and discharge planning was addressed.     ________________________________________________________________________  Total NON critical care TIME:  25  Minutes    Total CRITICAL CARE TIME Spent:   Minutes non procedure based      Comments   >50% of visit spent in counseling and coordination of care     ________________________________________________________________________  Tad Moore, MD     Procedures: see electronic medical records for all procedures/Xrays and details which were not copied into this note but were reviewed prior to creation of Plan.      LABS:  I reviewed today's most current labs and imaging studies.  Pertinent labs include:  No results for input(s): "WBC", "HGB", "HCT", "PLT" in the last 72 hours.    Recent Labs     07/17/22  0736   NA 137   K 3.6   CL 102   CO2 31   GLUCOSE 148*   BUN 22*   CREATININE 3.94*   CALCIUM 8.3*   MG 2.8*   PHOS 2.8       Signed: Tad Moore, MD

## 2022-07-19 NOTE — Progress Notes (Addendum)
2300 Bedside and Verbal shift change report given to Mercie Eon RN (oncoming nurse) by Joetta Manners RN (offgoing nurse). Report included the following information Nurse Handoff Report, MAR, and Cardiac Rhythm NSR .     Braden Score 15. All of the following interventions have been implemented to prevent pressure injury:    SKIN ASSESSMENT (S)  Dual skin assessment completed at shift change: Yes  Name of second RN who completed Dual Skin Assessment: Etha RN  Picture of wound uploaded to EMR: Yes  Venelex ordered and given per protocol: Yes  Wound care consulted if wounds present: Yes    SURFACE (S)  Stryker air pump or specialty bed ordered: No  Type of bed:    Only white chux used with specialty surfaces (no green chux used): No  Waffle cushion used for chair positioning: No    KEEP MOVING (K)  Mobility status (Bedrest, Chairbound, UWA x 1 assist , 2 assist, Max assist): X1-X2  Q2 hour turns documented: Yes  Refusals to turn and education provided documented: Yes  Device used to float heels:    PT/OT consulted: Yes    INCONTINENCE (I)  Incontinence status assessed Q2 hours: Yes  External catheter in use: [place]  Barrier cream in use: Venelex    NUTRITION (N)  I/O's documented every 8 hours: Yes  Oral supplements ordered if appropriate: Yes  Nutrition services consulted: Yes    All concerns about new DTI's must be escalated directly to attending MD, charge nurse, [place] and nurse director.     End of Shift Note    Bedside shift change report given to Gregary Signs RN (oncoming nurse) by Francene Boyers, RN (offgoing nurse).  Report included the following information SBAR, MAR, Recent Results, and Cardiac Rhythm NSR    Shift worked:  7p-7a     Shift summary and any significant changes:    Kept comfortable and needs attended       Concerns for physician to address:        Zone phone for oncoming shift:           Activity:     Number times ambulated in hallways past shift: 0  Number of times OOB to chair past shift: 0    Cardiac:   Cardiac  Monitoring: Yes           Access:  Current line(s): PIV     Genitourinary:   Urinary status: voiding and external catheter    Respiratory:      Chronic home O2 use?: NO  Incentive spirometer at bedside: NO       GI:     Current diet:  ADULT ORAL NUTRITION SUPPLEMENT; Breakfast, Lunch, Dinner; Renal Oral Supplement  ADULT DIET; Regular; 5 carb choices (75 gm/meal); Low Phosphorus (Less than 1000 mg)  Passing flatus: YES  Tolerating current diet: YES       Pain Management:   Patient states pain is manageable on current regimen: YES    Skin:     Interventions: specialty bed, float heels, increase time out of bed, foam dressing, PT/OT consult, limit briefs, internal/external urinary devices, and nutritional support    Patient Safety:  Fall Score:    Interventions: bed/chair alarm, assistive device (walker, cane. etc), gripper socks, pt to call before getting OOB, stay with me (per policy), and gait belt       Length of Stay:  Expected LOS: 52  Actual LOS: [place]      Francene Boyers, RN

## 2022-07-19 NOTE — Progress Notes (Signed)
Wound care to feet and sacrum completed at 1630 according to orders.

## 2022-07-20 ENCOUNTER — Inpatient Hospital Stay: Admit: 2022-07-20 | Payer: MEDICARE

## 2022-07-20 LAB — CBC WITH AUTO DIFFERENTIAL
Absolute Immature Granulocyte: 0.1 10*3/uL — ABNORMAL HIGH (ref 0.00–0.04)
Basophils %: 1 % (ref 0–1)
Basophils Absolute: 0.1 10*3/uL (ref 0.0–0.1)
Eosinophils %: 6 % (ref 0–7)
Eosinophils Absolute: 0.6 10*3/uL — ABNORMAL HIGH (ref 0.0–0.4)
Hematocrit: 26.9 % — ABNORMAL LOW (ref 36.6–50.3)
Hemoglobin: 8.6 g/dL — ABNORMAL LOW (ref 12.1–17.0)
Immature Granulocytes: 1 % — ABNORMAL HIGH (ref 0.0–0.5)
Lymphocytes %: 19 % (ref 12–49)
Lymphocytes Absolute: 1.8 10*3/uL (ref 0.8–3.5)
MCH: 32.2 PG (ref 26.0–34.0)
MCHC: 32 g/dL (ref 30.0–36.5)
MCV: 100.7 FL — ABNORMAL HIGH (ref 80.0–99.0)
MPV: 9.9 FL (ref 8.9–12.9)
Monocytes %: 19 % — ABNORMAL HIGH (ref 5–13)
Monocytes Absolute: 1.8 10*3/uL — ABNORMAL HIGH (ref 0.0–1.0)
Neutrophils %: 54 % (ref 32–75)
Neutrophils Absolute: 5.2 10*3/uL (ref 1.8–8.0)
Nucleated RBCs: 0 PER 100 WBC
Platelets: 193 10*3/uL (ref 150–400)
RBC: 2.67 M/uL — ABNORMAL LOW (ref 4.10–5.70)
RDW: 15.6 % — ABNORMAL HIGH (ref 11.5–14.5)
WBC: 9.5 10*3/uL (ref 4.1–11.1)
nRBC: 0 10*3/uL (ref 0.00–0.01)

## 2022-07-20 LAB — POCT GLUCOSE
POC Glucose: 148 mg/dL — ABNORMAL HIGH (ref 65–117)
POC Glucose: 122 mg/dL — ABNORMAL HIGH (ref 65–117)
POC Glucose: 106 mg/dL (ref 65–117)
POC Glucose: 115 mg/dL (ref 65–117)

## 2022-07-20 LAB — POST EXPOSURE PROFILE
HIV 1/O/2 Abs, Qual: NONREACTIVE
Hep B S Ag Interp: NEGATIVE
Hep C Ab Interp: NONREACTIVE
Hepatitis B Surface Ag: <0.1 Index
Hepatitis C Ab: 0.31 Index
P24 Antigen: NONREACTIVE

## 2022-07-20 LAB — BASIC METABOLIC PANEL W/ REFLEX TO MG FOR LOW K
Anion Gap: 5 mmol/L (ref 5–15)
BUN: 28 MG/DL — ABNORMAL HIGH (ref 6–20)
Bun/Cre Ratio: 6 — ABNORMAL LOW (ref 12–20)
CO2: 30 mmol/L (ref 21–32)
Calcium: 8.1 MG/DL — ABNORMAL LOW (ref 8.5–10.1)
Chloride: 100 mmol/L (ref 97–108)
Creatinine: 4.52 MG/DL — ABNORMAL HIGH (ref 0.70–1.30)
Est, Glom Filt Rate: 13 mL/min/{1.73_m2} — ABNORMAL LOW (ref >60)
Glucose: 119 mg/dL — ABNORMAL HIGH (ref 65–100)
Potassium: 3.4 mmol/L — ABNORMAL LOW (ref 3.5–5.1)
Sodium: 135 mmol/L — ABNORMAL LOW (ref 136–145)

## 2022-07-20 LAB — MAGNESIUM: Magnesium: 2.9 mg/dL — ABNORMAL HIGH (ref 1.6–2.4)

## 2022-07-20 LAB — PHOSPHORUS: Phosphorus: 3.2 MG/DL (ref 2.6–4.7)

## 2022-07-20 MED ORDER — HEPARIN SODIUM (PORCINE) 1000 UNIT/ML IJ SOLN
1000 UNIT/ML | INTRAMUSCULAR | Status: AC
Start: 2022-07-20 — End: 2022-07-20
  Administered 2022-07-20: 15:00:00 1800

## 2022-07-20 MED FILL — THERA M PLUS PO TABS: ORAL | Qty: 1

## 2022-07-20 MED FILL — RISAQUAD-2 PO CAPS: ORAL | Qty: 1

## 2022-07-20 MED FILL — HEPARIN SODIUM (PORCINE) 5000 UNIT/ML IJ SOLN: 5000 UNIT/ML | INTRAMUSCULAR | Qty: 1

## 2022-07-20 MED FILL — ACETAMINOPHEN 325 MG PO TABS: 325 MG | ORAL | Qty: 2

## 2022-07-20 MED FILL — SEVELAMER CARBONATE 2.4 G PO PACK: 2.4 g | ORAL | Qty: 2.4

## 2022-07-20 MED FILL — AMIODARONE HCL 200 MG PO TABS: 200 MG | ORAL | Qty: 1

## 2022-07-20 MED FILL — RETACRIT 3000 UNIT/ML IJ SOLN: 3000 UNIT/ML | INTRAMUSCULAR | Qty: 2

## 2022-07-20 MED FILL — CEFDINIR 300 MG PO CAPS: 300 MG | ORAL | Qty: 1

## 2022-07-20 MED FILL — HEPARIN SODIUM (PORCINE) 1000 UNIT/ML IJ SOLN: 1000 UNIT/ML | INTRAMUSCULAR | Qty: 10

## 2022-07-20 NOTE — Other (Signed)
Primary RN SBAR: Francene Boyers, RN  Patient Education: HD Treatment  Hepatitis B Surface Ag   Date/Time Value Ref Range Status   07/19/2022 06:10 PM <0.10 Index Final     Hep B S Ab   Date/Time Value Ref Range Status   06/29/2022 09:53 AM <3.10 mIU/mL Final        07/20/22 0753   Vital Signs   BP (!) 157/79   Temp 98.4 F (36.9 C)   Pulse 86   Respirations 18   Pain Assessment   Pain Assessment None - Denies Pain   Treatment   Time On 0753   Treatment Goal 582ml   Observations & Evaluations   Level of Consciousness 0   Oriented X 4   Heart Rhythm Regular   Respiratory Quality/Effort Unlabored   O2 Device None (Room air)   Bilateral Breath Sounds Clear   Skin Color Jaundice   Skin Condition/Temp Cool;Dry   Appetite Fair   Abdomen Inspection Soft   Bowel Sounds (All Quadrants) Active   Edema Right lower extremity;Left lower extremity   RLE Edema Trace   LLE Edema Trace   Technical Checks   Dialysis Machine No. 09   RO Machine Number [id number]   Dialyzer Lot No. [id number]   Tubing Lot Number [id number]   All Connections Secure Yes   NS Bag Yes   Saline Line Double Clamped Yes   Dialyzer Revaclear 300   Prime Volume (mL) 200 mL   ICEBOAT I;C;E;B;O;A;T   RO Machine Log Sheet Completed Yes   Machine Alarm Self Test Completed;Passed   Materials engineer Function   Extracorporeal Advertising copywriter Conductivity 13.8   Manual Ph 7.2   Bleach Test (Neg) Yes   Bath Temperature 98.6 F (37 C)   Treatment Initiation   Dialyze Hours 3   Treatment  Initiation Universal Precautions maintained;Lines secured to patient;Connections secured;Prime given;Venous Parameters set;Arterial Parameters set;Chiropodist engaged;Saline line double clamped;Revaclear Dialyzer   Dialysis Bath   K+ (Potassium) 3   Ca+ (Calcium) 2.5   Na+ (Sodium) 140   HCO3 (Bicarb) 35

## 2022-07-20 NOTE — Care Coordination-Inpatient (Addendum)
Transition of Care Plan:    RUR:  %  Prior Level of Functioning: independent  Disposition: SNF in [place] with HD set up  If SNF or IPR: Date FOC offered: 06/30/22  Date FOC received: wife agrees with plan - prefers closer to [place]  Accepting facility: [facility] once HD setup and authorization received   Date authorization started with reference number: will need auth for SNF once PT/OT notes are in  Date authorization received and expires:   Follow up appointments: new PCP and Specialist in [place] (ID, nephrology, podiatry)  DME needed:   Transportation at discharge: BLS [facility] stretcher transport- CM spoke with   IM/IMM Medicare/Tricare letter given:   Is patient a Veteran and connected with VA?    If yes, was [employer] transfer form completed and VA notified?   Caregiver Contact:   Discharge Caregiver contacted prior to discharge?   Care Conference needed? no  Barriers to discharge: HD setup, PT/OT, insurance authorization     CM received a call from [facility] SNF at [address] [place], [place] [address]- they can offer a bed to paitent - will need to set up OP HD at the center they utilize - [facility]- Spoke with [place] at [phone] - she faxed CM at checklist form - will need Total Core labs and also updated note for ESRD from nephrology - CM left a VM with Dr Smiley Houseman RN at office regarding need for above information for OP HD setup.  Once OP HD setup will need updated PT/OT notes to start authorization for SNF placement with [facility].       CM spoke with Susie wife and she is in agreement with placement with [facility]. Ella Jubilee, MSW      CM spoke with Toniann Fail at [facility] - need to clarify BLS transport to OP HD and let CM know- she will call CM back regarding if this is possible.      CM faxed medicals to [facility] OP HD center ph# [phone] fax# [phone]. Will send updated nephrology note, CXR and Total Core Antibody lab results.  Ella Jubilee, MSW

## 2022-07-20 NOTE — Progress Notes (Signed)
Occupational Therapy    Chart reviewed. Attempted to see pt for OT intervention however pt currently off the floor for dialysis. Will defer however continue to follow.     Sadiya Durand P Milani Lowenstein, OTR/L

## 2022-07-20 NOTE — Progress Notes (Signed)
Hospitalist Progress Note    NAME:   Jaime [name]   DOB: Mar 16, 1946   MRN: [id number]     Date/Time: 07/20/2022 8:00 AM  Patient PCP: No primary care provider on file.    Estimated discharge date: 9/18   Barriers: Placement     For reference :   76 y/m with prolonged hospitalization, was admitted to surgical service on 7/31 for perforated hollow viscus underwent emergent laparotomy/ septic shock d/t peritonitis, was intubated and extubated x 2. Also had AKI needing CVVH, now on HD. Also has bilateral foot gangrene. Was transferred to hospitalist service on 8/21.     Assessment / Plan:  #Septic shock- resolved   D/t perforated viscus  S/p Laparotomy 7/31, with wound vac drainage   Liver abscess s/p IR drainage, hepatic drain to be removed  Bacteremia     -Discussed with case management regarding discharge disposition - wife needs to set up financial assistance for transfer of patient to [place] . Other option could be to go to an SNF in [place] [facility] ) .     9/13:on Omnicef p.o. due to lack of IV access.   plan to continue Omnicef p.o. x2 more weeks  ending 8 weeks on 9/ 29.  9/15: Patient stable   9/16: Medically stable   Awaiting placement   continue Omnicef p.o.until  on 9/ 29.  9/17  2 episodes of fever 100.4-relieved with Tylenol  No gross symptoms of infection  Examination unremarkable  Patient on on antibiotics-we will monitor for the fever  If persistent high-grade fever will start work-up  ID following  9/18  Remained afebrile overnight  No new leukocyte noted on CBC  Continue with antibiotic as per ID    Antimicrobial orders for discharge  -Omnicef 300 mg p.o. daily end date 9/29  -- Pt will require repeat imaging of liver before conclusion of therapy  -Weekly CBC, CMP-  -Encourage adequate fluids, daily probiotic/yogurt  -ID follow-up - if pt remains in [place] call [phone]      #Acute hypoxemic respiratory failure:  Intubated 7/31, extubation 8/7  -Reintubated 8/13, extubated on 8/19  -Currently  on 2 liters [place]  -Weaned off o2      #Dysphagia:  -Ongoing SLP evaluation-patient tolerating small diet .   -Aspiration on FEES study  -Follow-up SLP evaluation     #Acute kidney injury:  -Now on HD  -Continue with HD  -Nephrology following  -Case management informed the need to set up HD at SNF prior to discharge .       #Bilateral lower extremity critical limb ischemia  Gangrene both feet  -Podiatry and vascular surgery following  Cleared by podiatry for demarcation+ autoamputation     #Diabetes mellitus:  -Sliding scale and lantus  -Monitor FS  -Diabetes team following     #Sacral wound:  -Wound care following            Medical Decision Making:   I personally reviewed labs: CBC, BMP  I personally reviewed imaging:  I personally reviewed EKG:  Toxic drug monitoring:   Discussed case with: Patient , RN        Code Status: Full   DVT Prophylaxis: Heparin   GI Prophylaxis:    Subjective:     Chief Complaint / Reason for Physician Visit  "Follow up case. No active complaints. ".  Discussed with RN events overnight.       Objective:     VITALS:  Last 24hrs VS reviewed since prior progress note. Most recent are:  Patient Vitals for the past 24 hrs:   BP Temp Temp src Pulse Resp SpO2   07/20/22 0357 127/67 98.2 F (36.8 C) Oral 89 16 96 %   07/19/22 2327 129/64 98.3 F (36.8 C) Oral 99 17 96 %   07/19/22 2000 -- 98.5 F (36.9 C) Oral 95 -- --   07/19/22 1519 (!) 151/63 98 F (36.7 C) -- 96 -- 95 %   07/19/22 1152 136/65 97.3 F (36.3 C) -- 92 -- 96 %         Intake/Output Summary (Last 24 hours) at 07/20/2022 0800  Last data filed at 07/20/2022 0559  Gross per 24 hour   Intake --   Output 750 ml   Net -750 ml          I had a face to face encounter and independently examined this patient on 07/20/2022, as outlined below:  PHYSICAL EXAM:  General: Alert, cooperative  EENT:  EOMI. Anicteric sclerae..Rt. hd catn - jugular   Resp:  CTA bilaterally, no wheezing or rales.  No accessory muscle use  CV:  Regular  rhythm,  No  edema  GI:  Soft, Non distended, Non tender.  +Bowel sounds, wound vac in place , drain on rt upper quadrant   Neurologic:  Alert and oriented X 3, normal speech,   Psych:   Good insight. Not anxious nor agitated  Foot :  bilateral feet - gangrene of most of the toes  , not yet full demarcation     Reviewed most current lab test results and cultures  YES  Reviewed most current radiology test results   YES  Review and summation of old records today    NO  Reviewed patient's current orders and MAR    YES  PMH/SH reviewed - no change compared to H&P  ________________________________________________________________________  Care Plan discussed with:    Comments   Patient x    Family      RN x    Care Manager     Consultant                        Multidiciplinary team rounds were held today with case manager, nursing, pharmacist and Occupational psychologist.  Patient's plan of care was discussed; medications were reviewed and discharge planning was addressed.     ________________________________________________________________________  Total NON critical care TIME:  25  Minutes    Total CRITICAL CARE TIME Spent:   Minutes non procedure based      Comments   >50% of visit spent in counseling and coordination of care     ________________________________________________________________________  Tad Moore, MD     Procedures: see electronic medical records for all procedures/Xrays and details which were not copied into this note but were reviewed prior to creation of Plan.      LABS:  I reviewed today's most current labs and imaging studies.  Pertinent labs include:  No results for input(s): "WBC", "HGB", "HCT", "PLT" in the last 72 hours.    No results for input(s): "NA", "K", "CL", "CO2", "GLUCOSE", "BUN", "CREATININE", "CALCIUM", "MG", "PHOS", "LABALBU", "BILITOT", "AST", "ALT", "INR" in the last 72 hours.      Signed: Tad Moore, MD

## 2022-07-20 NOTE — Other (Signed)
07/20/22 1054   Vital Signs   BP (!) 149/78   Temp 97.6 F (36.4 C)   Pulse 86   Respirations 18   Pain Assessment   Pain Assessment None - Denies Pain   Post-Hemodialysis Assessment   Post-Treatment Procedures Blood returned;Catheter capped, clamped and heparinized x 2 ports   Herbalist   Rinseback Volume (ml) 300 ml   Blood Volume Processed (Liters) 66.2 L   Dialyzer Clearance Lightly streaked   Duration of Treatment (minutes) 180 minutes   Hemodialysis Intake (ml) 500 ml   Hemodialysis Output (ml) 1000 ml   NET Removed (ml) 500   Tolerated Treatment Good   Bilateral Breath Sounds Clear   Edema Right lower extremity;Left lower extremity   RLE Edema Trace   LLE Edema Trace   Time Off 1054   Patient Disposition Return to room     Primary RN SBAR: Evangeline Dakin, RN  Comments: Pt tolerated treatment well without any complaints or complications.

## 2022-07-20 NOTE — Progress Notes (Signed)
Chart reviewed. Attempted to see pt for PT intervention however pt currently off the floor for dialysis. Will defer however continue to follow.     Michel Santee, PT, DPT

## 2022-07-20 NOTE — Progress Notes (Shared)
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    Needs outpatient HD placement under ESKD diagnosis - CM working on options (here or back in [place])    Watch for renal recovery, but none so far.    H/H not at goal.  Continue ESA.                Subjective:     Chief Complaint: Resting. The patient was seen on dialysis at 9:02 AM .  BP is stable. Catheter is functioning well.            Review of Systems:    Symptom Y/N Comments  Symptom Y/N Comments   Fever/Chills    Chest Pain     Poor Appetite    Edema     Cough    Abdominal Pain     Sputum    Joint Pain     SOB/DOE    Pruritis/Rash     Nausea/vomit    Tolerating PT/OT     Diarrhea    Tolerating Diet     Constipation    Other       Could not obtain due to:      Objective:     VITALS:   Last 24hrs VS reviewed since prior progress note. Most recent are:  Vitals:    07/20/22 0357   BP: 127/67   Pulse: 89   Resp: 16   Temp: 98.2 F (36.8 C)   SpO2: 96%       Intake/Output Summary (Last 24 hours) at 07/20/2022 0556  Last data filed at 07/19/2022 1857  Gross per 24 hour   Intake --   Output 50 ml   Net -50 ml        Telemetry Reviewed:     PHYSICAL EXAM:  General: NAD  trace edema  R TDC intact  B/l toes- dry gangrene      Lab Data Reviewed: (see below)    Medications Reviewed: (see below)    PMH/SH reviewed - no change compared to H&P  ________________________________________________________________________  Care Plan discussed with:  Patient     Family      RN     Care Manager                    Consultant:          Comments   >50% of visit spent in counseling and coordination of care        ________________________________________________________________________  Tanna Savoy, MD     Procedures: see electronic medical records for all procedures/Xrays and details which  were not copied into this note but were reviewed prior to creation of Plan.      LABS:  No results for input(s): "WBC", "HGB", "HCT", "PLT" in the last 72 hours.  Recent Labs     07/17/22  0736   NA 137   K 3.6   CL 102   CO2 31   BUN 22*   MG 2.8*   PHOS 2.8         MEDICATIONS:  Current Facility-Administered Medications   Medication Dose Route Frequency    oxyCODONE (ROXICODONE) immediate release tablet 5 mg  5 mg Oral Q4H PRN    diphenhydrAMINE (BENADRYL) capsule 25 mg  25 mg Oral Q6H PRN    cefdinir (OMNICEF) capsule 300 mg  300 mg Oral Daily    sevelamer (RENVELA) packet 2.4 g  2.4 g Oral TID WC    heparin (porcine) 1000 UNIT/ML injection 1,800 Units  1,800 Units IntraCATHeter PRN    And    heparin (porcine) 1000 UNIT/ML injection 1,800 Units  1,800 Units IntraCATHeter PRN    guaiFENesin (ROBITUSSIN) 100 MG/5ML liquid 200 mg  200 mg Oral Q4H PRN    heparin (porcine) injection 5,000 Units  5,000 Units SubCUTAneous 3 times per day    therapeutic multivitamin-minerals 1 tablet  1 tablet Per NG tube Daily    amiodarone (CORDARONE) tablet 200 mg  200 mg Per NG tube Daily    acidophilus probiotic capsule 1 capsule  1 capsule Oral Daily    lidocaine 2 % injection 20 mL  20 mL IntraDERmal Once    collagenase ointment   Topical Daily    epoetin alfa-epbx (RETACRIT) injection 6,000 Units  6,000 Units SubCUTAneous Once per day on Mon Wed Fri    fentaNYL (SUBLIMAZE) injection 25 mcg  25 mcg IntraVENous Q1H PRN    midodrine (PROAMATINE) tablet 5 mg  5 mg Oral Daily PRN    insulin lispro (HUMALOG) injection vial 0-4 Units  0-4 Units SubCUTAneous 4 times per day    bisacodyl (DULCOLAX) suppository 10 mg  10 mg Rectal Daily PRN    albumin human 25% IV solution 25 g  25 g IntraVENous PRN    sodium chloride 0.9 % bolus 100 mL  100 mL IntraVENous  PRN    balsum peru-castor oil (VENELEX) ointment   Topical BID    glucose chewable tablet 16 g  4 tablet Oral PRN    dextrose bolus 10% 125 mL  125 mL IntraVENous PRN    Or    dextrose bolus 10% 250 mL  250 mL IntraVENous PRN    glucagon (rDNA) injection 1 mg  1 mg SubCUTAneous PRN    dextrose 10 % infusion   IntraVENous Continuous PRN    sodium chloride flush 0.9 % injection 5-40 mL  5-40 mL IntraVENous 2 times per day    sodium chloride flush 0.9 % injection 5-40 mL  5-40 mL IntraVENous PRN    0.9 % sodium chloride infusion   IntraVENous PRN    acetaminophen (TYLENOL) tablet 650 mg  650 mg Oral Q6H PRN    Or    acetaminophen (TYLENOL) suppository 650 mg  650 mg Rectal Q6H PRN    ondansetron (ZOFRAN) injection 4 mg  4 mg IntraVENous Q6H PRN

## 2022-07-20 NOTE — Progress Notes (Signed)
Infectious Disease Progress    Impression      Hepatic abscess  Gas and fluid containing collection 7.1 x 10.8 x 5.4 cm  In anterior left lobe of liver, patchy areas of hypodensity right lobe  4 x 4.5 x 5.5 cm collection  S/p drainage by IR 8/4.Cultures + for  few E.coli  CT abdomen/pelvis 9/12+ for collection 5.8 x 3.3 cm  Previously 5 x 3 cm.  Peripancreatic fatty infiltration & stranding again noted.  Patient changed to [facility] 9/12 2ry to lack of IV access  Patient doing well on  Omnicef, no adverse side effects.  Drain output <30cc    Sepsis  Septic shock.  Resolved    E. coli bacteremia  Blood cultures 7/31+ for E. coli   2/2 LAC (pan sensitive)   Negative repeat cultures 8/4   Treated.    Acute abdomen  Pneumoperitoneum  S/p diagnostic laparoscopy, laparotomy  EGD 7/31  Findings of cloudy fluid in right upper quadrant, yellow/white  Inflammatory peel over lesser curve of stomach, inferior lobe of liver  No gross perforation identified  Pancreas and retroperitoneum appeared edematous  Intra-Op fluid culture + for E. Coli (pansensitive)    S/p abdominal wound dehiscence  Clean, no purulence per wound care  Cultures 8/17, 8/18-NG.    Leukocytosis   Now resolved wbc 7.8      Gangrene, discoloration of toes  Persist      Acute hypoxic respiratory failure  Re intubated 8/13, extubated 8/19  Initially intubated 7/31, s/p extubation 8/7  CXR  8/20 + + for atelectasis  Resp cultures  8/13+ for light yeast  Apparent C.albicans/ dubliensis        AKI  Cr 4.86 on HD    Coagulopathy  Improving    Thrombocytopenia  resolved      Transaminitis  Improving    Hyperbilirubinemia   Improved      Diabetes type 2  Hyperglycemia  A1c 7.7    Diarrhea  resolved    Obesity  BMI 34.76  Plan  Pt was on ceftriaxone 2 g IV daily   Total antibiotic coverage 6 weeks ending 9/15  Currently on Omnicef p.o. due to lack of IV access.  We will plan to continue Omnicef p.o. x2 more weeks  ending 8 weeks on 9/ 29.    Antimicrobial orders for  discharge  -Omnicef 300 mg p.o. daily end date 9/29  -- Pt will require repeat imaging of liver before conclusion of therapy  -Weekly CBC, CMP-  -Encourage adequate fluids, daily probiotic/yogurt  -ID follow-up - if pt remains in [place] call [phone]   For follow up.  Will follow peripherally.    Possible adverse effects of long term antibiotics are inclusive of but not limited to following  BM suppression, neutropenia , cytopenias , aplastic anemia hemorrhage liver & renal dysfunction/ liver , renal failure  , GI dysfunction- N, V  Diarrhea,C.difficile disease, rash , allergy , anaphylaxis.toxic epidermal necrolysis  Neuro toxicity , seizure disorder  Side effects tend to be more pronounced in the elderly  Pt to report persistent symptoms such as N,V , diarrhea, numbness tingling of extremities  or any other symptoms      May DC from ID standpoint        Extensive review of chart notes, labs, imaging, cultures done  Additionally review of done: Drain output      Patient seen  today.  Awake, denies new complaints  Greenish drain  output noted in bag.      History reviewed. No pertinent past medical history.    Past Surgical History:   Procedure Laterality Date    BLADDER SURGERY N/A 06/01/2022    ENDOSCOPY OF ILEAL CONDUIT performed by Guinevere Ferrari, MD at [facility] MAIN OR    CT VISCERAL PERCUTANEOUS DRAIN  06/05/2022    CT VISCERAL PERCUTANEOUS DRAIN 06/05/2022 [facility]    IR TUNNELED CATHETER PLACEMENT GREATER THAN 5 YEARS  07/01/2022    IR TUNNELED CATHETER PLACEMENT GREATER THAN 5 YEARS 07/01/2022 [facility] [place], APRN - NP [facility] ANGIO IR    LAPAROSCOPY N/A 06/01/2022    LAPAROSCOPY DIAGNOSTIC performed by Guinevere Ferrari, MD at [facility]    LAPAROTOMY N/A 06/01/2022    LAPAROTOMY EXPLORATORY performed by Guinevere Ferrari, MD at [facility]       Allergies   Allergen Reactions    Augmentin [Amoxicillin-Pot Clavulanate] Hives     Tolerated ceftriaxone 06/2022    Codeine      Unknown reaction    Oxycodone Itching        Social Connections: Not on file       No family status information on file.           Review of Systems - Negative except those mentioned in H&P      PHYSICAL EXAM:  General:          Awake, in no distress  EENT:              EOMI. Anicteric sclerae. MMM  Resp:               CTA bilaterally, no wheezing or rales.  No accessory muscle use  CV:                  Regular  rhythm,  No edema  GI:                   Soft, Non distended, Non tender.  +Bowel sounds  Neurologic:      Alert and oriented X 3, normal speech,   Psych:             Good insight. Not anxious nor agitated  Skin:                No rashes.  No jaundice.  Extremities :  No edema, discoloration of  toes++.   Drain+ minimal drainage  Waymon Amato, MD Jerrel Ivory

## 2022-07-20 NOTE — Progress Notes (Signed)
Braden Score 13. All of the following interventions have been implemented to prevent pressure injury:    SKIN ASSESSMENT (S)  Dual skin assessment completed at shift change: Yes  Name of second RN who completed Dual Skin Assessment: Gregary Signs, RN & Nira Conn, RN  Picture of wound uploaded to EMR: Yes  Venelex ordered and given per protocol: Yes  Wound care consulted if wounds present: Yes    SURFACE (S)  Stryker air pump or specialty bed ordered: Yes  Type of bed: Airbed  Only white chux used with specialty surfaces (no green chux used): Yes  Waffle cushion used for chair positioning: NA    KEEP MOVING (K)  Mobility status (Bedrest, Chairbound, UWA x 1 assist , 2 assist, Max assist): Max assist  Q2 hour turns documented: Yes  Refusals to turn and education provided documented: NA  Device used to float heels: Heels up  PT/OT consulted: Yes    INCONTINENCE (I)  Incontinence status assessed Q2 hours: Yes  External catheter in use: No, Pt. Inadequate anatomy  Barrier cream in use: Yes    NUTRITION (N)  I/O's documented every 8 hours: Yes  Oral supplements ordered if appropriate: Yes  Nutrition services consulted: Yes    All concerns about new DTI's must be escalated directly to attending MD, charge nurse, [place] and nurse director.

## 2022-07-20 NOTE — Plan of Care (Signed)
Problem: Safety - Adult  Goal: Free from fall injury  07/20/2022 0028 by Theodoro Clock, RN  Outcome: Progressing  07/19/2022 2030 by Annice Needy, RN  Outcome: Progressing     Problem: Respiratory - Adult  Goal: Achieves optimal ventilation and oxygenation  07/20/2022 0028 by Theodoro Clock, RN  Outcome: Progressing  07/19/2022 2030 by Annice Needy, RN  Outcome: Progressing     Problem: Pain  Goal: Verbalizes/displays adequate comfort level or baseline comfort level  07/20/2022 0028 by Theodoro Clock, RN  Outcome: Progressing  07/19/2022 2030 by Annice Needy, RN  Outcome: Progressing     Problem: Discharge Planning  Goal: Discharge to home or other facility with appropriate resources  07/20/2022 0028 by Theodoro Clock, RN  Outcome: Progressing  07/19/2022 2030 by Annice Needy, RN  Outcome: Progressing     Problem: Chronic Conditions and Co-morbidities  Goal: Patient's chronic conditions and co-morbidity symptoms are monitored and maintained or improved  07/20/2022 0028 by Theodoro Clock, RN  Outcome: Progressing  07/19/2022 2030 by Annice Needy, RN  Outcome: Progressing     Problem: Neurosensory - Adult  Goal: Achieves maximal functionality and self care  07/20/2022 0028 by Theodoro Clock, RN  Outcome: Progressing  07/19/2022 2030 by Annice Needy, RN  Outcome: Progressing     Problem: Cardiovascular - Adult  Goal: Maintains optimal cardiac output and hemodynamic stability  07/20/2022 0028 by Theodoro Clock, RN  Outcome: Progressing  Flowsheets (Taken 07/19/2022 2313)  Maintains optimal cardiac output and hemodynamic stability: Monitor blood pressure and heart rate  07/19/2022 2030 by Annice Needy, RN  Outcome: Progressing  Goal: Absence of cardiac dysrhythmias or at baseline  07/20/2022 0028 by Theodoro Clock, RN  Outcome: Progressing  Flowsheets (Taken 07/19/2022 2313)  Absence of cardiac dysrhythmias or at baseline: Monitor cardiac rate and rhythm  07/19/2022 2030 by Annice Needy, RN  Outcome: Progressing     Problem:  Gastrointestinal - Adult  Goal: Maintains or returns to baseline bowel function  07/20/2022 0028 by Theodoro Clock, RN  Outcome: Progressing  07/19/2022 2030 by Annice Needy, RN  Outcome: Progressing  Goal: Maintains adequate nutritional intake  07/20/2022 0028 by Theodoro Clock, RN  Outcome: Progressing  07/19/2022 2030 by Annice Needy, RN  Outcome: Progressing     Problem: Genitourinary - Adult  Goal: Absence of urinary retention  07/20/2022 0028 by Theodoro Clock, RN  Outcome: Progressing  Flowsheets (Taken 07/19/2022 2313)  Absence of urinary retention: Assess patient's ability to void and empty bladder  07/19/2022 2030 by Annice Needy, RN  Outcome: Progressing     Problem: Metabolic/Fluid and Electrolytes - Adult  Goal: Electrolytes maintained within normal limits  07/20/2022 0028 by Theodoro Clock, RN  Outcome: Progressing  07/19/2022 2030 by Annice Needy, RN  Outcome: Progressing  Goal: Hemodynamic stability and optimal renal function maintained  07/20/2022 0028 by Theodoro Clock, RN  Outcome: Progressing  07/19/2022 2030 by Annice Needy, RN  Outcome: Progressing  Goal: Glucose maintained within prescribed range  07/20/2022 0028 by Theodoro Clock, RN  Outcome: Progressing  07/19/2022 2030 by Annice Needy, RN  Outcome: Progressing     Problem: Skin/Tissue Integrity - Adult  Goal: Skin integrity remains intact  07/20/2022 0028 by Theodoro Clock, RN  Outcome: Progressing  07/19/2022 2030 by Annice Needy, RN  Outcome: Progressing  Flowsheets (Taken 07/19/2022 2029)  Skin Integrity Remains Intact: Monitor for areas of redness and/or skin breakdown  Goal:  Incisions, wounds, or drain sites healing without S/S of infection  07/20/2022 0028 by Francene Boyers, RN  Outcome: Progressing  07/19/2022 2030 by Evelena Asa, RN  Outcome: Progressing  Goal: Oral mucous membranes remain intact  07/20/2022 0028 by Francene Boyers, RN  Outcome: Progressing  07/19/2022 2030 by Evelena Asa, RN  Outcome: Progressing     Problem: Hematologic -  Adult  Goal: Maintains hematologic stability  Outcome: Progressing     Problem: Musculoskeletal - Adult  Goal: Return mobility to safest level of function  Outcome: Progressing  Flowsheets (Taken 07/19/2022 2313)  Return Mobility to Safest Level of Function: Assess patient stability and activity tolerance for standing, transferring and ambulating with or without assistive devices  Goal: Maintain proper alignment of affected body part  Outcome: Progressing  Goal: Return ADL status to a safe level of function  Outcome: Progressing     Problem: Skin/Tissue Integrity  Goal: Absence of new skin breakdown  Description: 1.  Monitor for areas of redness and/or skin breakdown  2.  Assess vascular access sites hourly  3.  Every 4-6 hours minimum:  Change oxygen saturation probe site  4.  Every 4-6 hours:  If on nasal continuous positive airway pressure, respiratory therapy assess nares and determine need for appliance change or resting period.  07/20/2022 0028 by Francene Boyers, RN  Outcome: Progressing  07/19/2022 2030 by Evelena Asa, RN  Outcome: Progressing     Problem: ABCDS Injury Assessment  Goal: Absence of physical injury  07/20/2022 0028 by Francene Boyers, RN  Outcome: Progressing  07/19/2022 2030 by Evelena Asa, RN  Outcome: Progressing     Problem: Safety - Medical Restraint  Goal: Remains free of injury from restraints (Restraint for Interference with Medical Device)  Description: INTERVENTIONS:  1. Determine that other, less restrictive measures have been tried or would not be effective before applying the restraint  2. Evaluate the patient's condition at the time of restraint application  3. Inform patient/family regarding the reason for restraint  4. Q2H: Monitor safety, psychosocial status, comfort, nutrition and hydration  07/20/2022 0028 by Francene Boyers, RN  Outcome: Progressing  07/19/2022 2030 by Evelena Asa, RN  Outcome: Progressing

## 2022-07-20 NOTE — Plan of Care (Signed)
Problem: Safety - Adult  Goal: Free from fall injury  Outcome: Progressing     Problem: Respiratory - Adult  Goal: Achieves optimal ventilation and oxygenation  Outcome: Progressing  Flowsheets (Taken 07/20/2022 1120)  Achieves optimal ventilation and oxygenation: Assess for changes in respiratory status     Problem: Pain  Goal: Verbalizes/displays adequate comfort level or baseline comfort level  Outcome: Progressing  Flowsheets  Taken 07/20/2022 1512  Verbalizes/displays adequate comfort level or baseline comfort level: Encourage patient to monitor pain and request assistance  Taken 07/20/2022 1120  Verbalizes/displays adequate comfort level or baseline comfort level: Encourage patient to monitor pain and request assistance     Problem: Discharge Planning  Goal: Discharge to home or other facility with appropriate resources  Outcome: Progressing     Problem: Genitourinary - Adult  Goal: Absence of urinary retention  Outcome: Progressing     Problem: Metabolic/Fluid and Electrolytes - Adult  Goal: Electrolytes maintained within normal limits  Outcome: Progressing

## 2022-07-20 NOTE — Progress Notes (Addendum)
0730: Pt transported to dialysis.     0740: Bedside and Verbal shift change report given to Evangeline Dakin, RN  (oncoming nurse) by Mercie Eon, RN (offgoing nurse). Report included the following information Nurse Handoff Report, Index, ED Encounter Summary, Intake/Output, MAR, Recent Results, and Cardiac Rhythm NSR .   *VS not collected nor shift assessment completed before this RN assumed care of pt.     1118: Pt returned to floor from dialysis.    End of Shift Note    Bedside shift change report given to RN (oncoming nurse) by Evangeline Dakin, RN (offgoing nurse).  Report included the following information SBAR, Kardex, ED Summary, Intake/Output, MAR, Recent Results, and Cardiac Rhythm NSR    Shift worked:  [phone]     Shift summary and any significant changes:    No significant changes    Sacral wound care completed.    Wound care completed to BLE    Pt voided (unmeasurable) x1 this shift.     PRN Tylenol given x1 this shift for generalized pain in neck/shoulders.    Dialysis completed, labs collected during treatment.      Concerns for physician to address:       Zone phone for oncoming shift:          Activity:     Number times ambulated in hallways past shift: 0  Number of times OOB to chair past shift: 0    Cardiac:   Cardiac Monitoring: Yes           Access:  Current line(s): HD access     Genitourinary:   Urinary status: voiding and incontinent    Respiratory:      Chronic home O2 use?: NO  Incentive spirometer at bedside: NO       GI:     Current diet:  ADULT ORAL NUTRITION SUPPLEMENT; Breakfast, Lunch, Dinner; Renal Oral Supplement  ADULT DIET; Regular; 5 carb choices (75 gm/meal); Low Phosphorus (Less than 1000 mg)  Passing flatus: YES  Tolerating current diet: YES       Pain Management:   Patient states pain is manageable on current regimen: YES    Skin:     Interventions: specialty bed, float heels, foam dressing, PT/OT consult, limit briefs, and internal/external urinary devices    Patient Safety:  Fall  Score:    Interventions: bed/chair alarm, assistive device (walker, cane. etc), pt to call before getting OOB, and stay with me (per policy)       Length of Stay:  Expected LOS: 54  Actual LOS: [place], RN

## 2022-07-20 NOTE — Plan of Care (Signed)
Problem: Safety - Adult  Goal: Free from fall injury  07/20/2022 1950 by Theodoro Clock, RN  Outcome: Progressing  07/20/2022 1950 by Theodoro Clock, RN  Outcome: Progressing  07/20/2022 1702 by Nadara Mustard, RN  Outcome: Progressing     Problem: Respiratory - Adult  Goal: Achieves optimal ventilation and oxygenation  07/20/2022 1950 by Theodoro Clock, RN  Outcome: Progressing  07/20/2022 1950 by Theodoro Clock, RN  Outcome: Progressing  07/20/2022 1702 by Nadara Mustard, RN  Outcome: Progressing  Flowsheets (Taken 07/20/2022 1120)  Achieves optimal ventilation and oxygenation: Assess for changes in respiratory status     Problem: Pain  Goal: Verbalizes/displays adequate comfort level or baseline comfort level  07/20/2022 1950 by Theodoro Clock, RN  Outcome: Progressing  07/20/2022 1950 by Theodoro Clock, RN  Outcome: Progressing  07/20/2022 1702 by Nadara Mustard, RN  Outcome: Progressing  Flowsheets  Taken 07/20/2022 1512  Verbalizes/displays adequate comfort level or baseline comfort level: Encourage patient to monitor pain and request assistance  Taken 07/20/2022 1120  Verbalizes/displays adequate comfort level or baseline comfort level: Encourage patient to monitor pain and request assistance     Problem: Discharge Planning  Goal: Discharge to home or other facility with appropriate resources  07/20/2022 1950 by Theodoro Clock, RN  Outcome: Progressing  07/20/2022 1950 by Theodoro Clock, RN  Outcome: Progressing  07/20/2022 1702 by Nadara Mustard, RN  Outcome: Progressing     Problem: Chronic Conditions and Co-morbidities  Goal: Patient's chronic conditions and co-morbidity symptoms are monitored and maintained or improved  07/20/2022 1950 by Theodoro Clock, RN  Outcome: Progressing  07/20/2022 1950 by Theodoro Clock, RN  Outcome: Progressing     Problem: Neurosensory - Adult  Goal: Achieves maximal functionality and self care  07/20/2022 1950 by Theodoro Clock, RN  Outcome: Progressing  07/20/2022 1950 by Theodoro Clock, RN  Outcome:  Progressing  Flowsheets (Taken 07/20/2022 1120 by Nadara Mustard, RN)  Achieves maximal functionality and self care: Monitor swallowing and airway patency with patient fatigue and changes in neurological status     Problem: Cardiovascular - Adult  Goal: Maintains optimal cardiac output and hemodynamic stability  Outcome: Progressing  Flowsheets (Taken 07/20/2022 1120 by Nadara Mustard, RN)  Maintains optimal cardiac output and hemodynamic stability: Monitor blood pressure and heart rate  Goal: Absence of cardiac dysrhythmias or at baseline  Outcome: Progressing     Problem: Gastrointestinal - Adult  Goal: Maintains or returns to baseline bowel function  Outcome: Progressing  Flowsheets (Taken 07/20/2022 1120 by Nadara Mustard, RN)  Maintains or returns to baseline bowel function: Assess bowel function  Goal: Maintains adequate nutritional intake  Outcome: Progressing     Problem: Genitourinary - Adult  Goal: Absence of urinary retention  07/20/2022 1950 by Theodoro Clock, RN  Outcome: Progressing  07/20/2022 1702 by Nadara Mustard, RN  Outcome: Progressing     Problem: Metabolic/Fluid and Electrolytes - Adult  Goal: Electrolytes maintained within normal limits  07/20/2022 1950 by Theodoro Clock, RN  Outcome: Progressing  07/20/2022 1702 by Nadara Mustard, RN  Outcome: Progressing  Goal: Hemodynamic stability and optimal renal function maintained  Outcome: Progressing  Goal: Glucose maintained within prescribed range  Outcome: Progressing     Problem: Skin/Tissue Integrity - Adult  Goal: Skin integrity remains intact  Outcome: Progressing  Flowsheets (Taken 07/20/2022 1120 by Nadara Mustard, RN)  Skin Integrity Remains Intact: Monitor for areas of redness and/or skin breakdown  Goal: Incisions, wounds, or drain sites healing without S/S of infection  Outcome:  Progressing  Goal: Oral mucous membranes remain intact  Outcome: Progressing     Problem: Hematologic - Adult  Goal: Maintains hematologic stability  Outcome:  Progressing     Problem: Musculoskeletal - Adult  Goal: Return mobility to safest level of function  Outcome: Progressing  Flowsheets (Taken 07/20/2022 1120 by Evangeline Dakin, RN)  Return Mobility to Safest Level of Function: Assess patient stability and activity tolerance for standing, transferring and ambulating with or without assistive devices  Goal: Maintain proper alignment of affected body part  Outcome: Progressing  Goal: Return ADL status to a safe level of function  Outcome: Progressing     Problem: Skin/Tissue Integrity  Goal: Absence of new skin breakdown  Description: 1.  Monitor for areas of redness and/or skin breakdown  2.  Assess vascular access sites hourly  3.  Every 4-6 hours minimum:  Change oxygen saturation probe site  4.  Every 4-6 hours:  If on nasal continuous positive airway pressure, respiratory therapy assess nares and determine need for appliance change or resting period.  Outcome: Progressing     Problem: ABCDS Injury Assessment  Goal: Absence of physical injury  Outcome: Progressing     Problem: Safety - Medical Restraint  Goal: Remains free of injury from restraints (Restraint for Interference with Medical Device)  Description: INTERVENTIONS:  1. Determine that other, less restrictive measures have been tried or would not be effective before applying the restraint  2. Evaluate the patient's condition at the time of restraint application  3. Inform patient/family regarding the reason for restraint  4. Q2H: Monitor safety, psychosocial status, comfort, nutrition and hydration  07/20/2022 1950 by Francene Boyers, RN  Outcome: Progressing  07/20/2022 1950 by Francene Boyers, RN  Outcome: Progressing

## 2022-07-20 NOTE — Progress Notes (Addendum)
1900 Bedside and Verbal shift change report given to Daryl Eastern RN (oncoming nurse) by Amil Amen RN (offgoing nurse). Report included the following information Nurse Handoff Report, MAR, Recent Results, and Cardiac Rhythm NSR .     Braden Score 13. All of the following interventions have been implemented to prevent pressure injury:    SKIN ASSESSMENT (S)  Dual skin assessment completed at shift change: Yes  Name of second RN who completed Dual Skin Assessment: Amil Amen RN  Picture of wound uploaded to EMR: Yes  Venelex ordered and given per protocol: Yes  Wound care consulted if wounds present: Yes    SURFACE (S)  Stryker air pump or specialty bed ordered: No  Type of bed:    Only white chux used with specialty surfaces (no green chux used): No  Waffle cushion used for chair positioning: No    KEEP MOVING (K)  Mobility status (Bedrest, Chairbound, UWA x 1 assist , 2 assist, Max assist): Max  Q2 hour turns documented: Yes  Refusals to turn and education provided documented: Yes  Device used to float heels:    PT/OT consulted: Yes    INCONTINENCE (I)  Incontinence status assessed Q2 hours: Yes  External catheter in use: [place]  Barrier cream in use: Venelex    NUTRITION (N)  I/O's documented every 8 hours: Yes  Oral supplements ordered if appropriate: Yes  Nutrition services consulted: Yes    All concerns about new DTI's must be escalated directly to attending MD, charge nurse, CCL and nurse director.

## 2022-07-20 NOTE — Care Coordination-Inpatient (Addendum)
Transition of Care Plan:     RUR: 13% (low RUR)  Prior Level of Functioning: Independent  [place] - will need auth; unlikely for patient to be accepted into IPR/LTAC.  If SNF or IPR: Date FOC offered: 06/30/22 - SNF/LTAC/IPR   Date FOC received: Agreeable to various options  Accepting facility: TBD  Date authorization started with reference number: TBD  Date authorization received and expires: TBD  Follow up appointments: defer to rehab  DME needed: defer to rehab  Transportation at discharge: [facility] or [facility] anticipated.  IM/IMM Medicare/Tricare letter given: 2nd IM need prior to discharge  Is patient a Veteran and connected with VA? Yes              If yes, was [facility] transfer form completed and [facility] notified? Yes  Caregiver Contact: Marcy Bogosian - Wife - [phone]  Discharge Caregiver contacted prior to discharge? no  Care Conference needed? No.  Barriers to discharge: new HD, wound vac/wound care orders, placement and insurance authorization, transportation for OP HD, need lab ordered for OPHD setup (Hep B Total Core Antibody) per previous CM note.    CM Team Lead assisting with finding SNF placement for patient.  CM spoke with Dr. Smitty Cords who confirmed patient is now ESRD and she was notified of Total Core AB lab needed for new HD setup; CM Team Lead notified.      Lucille Passy, BSN, RN    Care Management  [phone]

## 2022-07-21 LAB — POCT GLUCOSE
POC Glucose: 133 mg/dL — ABNORMAL HIGH (ref 65–117)
POC Glucose: 116 mg/dL (ref 65–117)
POC Glucose: 140 mg/dL — ABNORMAL HIGH (ref 65–117)
POC Glucose: 178 mg/dL — ABNORMAL HIGH (ref 65–117)

## 2022-07-21 LAB — HEPATITIS B CORE ANTIBODY, TOTAL: Hep B Core Total Ab: NEGATIVE

## 2022-07-21 MED ORDER — ALPRAZOLAM 0.25 MG PO TABS
0.25 MG | Freq: Once | ORAL | Status: DC
Start: 2022-07-21 — End: 2022-07-21

## 2022-07-21 MED ORDER — ALPRAZOLAM 0.25 MG PO TABS
0.25 MG | Freq: Two times a day (BID) | ORAL | Status: DC | PRN
Start: 2022-07-21 — End: 2022-08-03

## 2022-07-21 MED FILL — SEVELAMER CARBONATE 2.4 G PO PACK: 2.4 g | ORAL | Qty: 2.4

## 2022-07-21 MED FILL — CEFDINIR 300 MG PO CAPS: 300 MG | ORAL | Qty: 1

## 2022-07-21 MED FILL — HEPARIN SODIUM (PORCINE) 5000 UNIT/ML IJ SOLN: 5000 UNIT/ML | INTRAMUSCULAR | Qty: 1

## 2022-07-21 MED FILL — RENVELA 2.4 G PO PACK: 2.4 g | ORAL | Qty: 2.4

## 2022-07-21 MED FILL — ACETAMINOPHEN 325 MG PO TABS: 325 MG | ORAL | Qty: 2

## 2022-07-21 MED FILL — THERA M PLUS PO TABS: ORAL | Qty: 1

## 2022-07-21 MED FILL — AMIODARONE HCL 200 MG PO TABS: 200 MG | ORAL | Qty: 1

## 2022-07-21 MED FILL — RISAQUAD-2 PO CAPS: ORAL | Qty: 1

## 2022-07-21 NOTE — Progress Notes (Signed)
Hospitalist Progress Note    NAME:   Jaime [name]   DOB: 1945/11/10   MRN: [id number]     Date/Time: 07/21/2022 8:27 AM  Patient PCP: No primary care provider on file.    Estimated discharge date: 9/21   Barriers: Placement     For reference :   76 y/m with prolonged hospitalization, was admitted to surgical service on 7/31 for perforated hollow viscus underwent emergent laparotomy/ septic shock d/t peritonitis, was intubated and extubated x 2. Also had AKI needing CVVH, now on HD. Also has bilateral foot gangrene. Was transferred to hospitalist service on 8/21.     Assessment / Plan:  #Septic shock- resolved   D/t perforated viscus  S/p Laparotomy 7/31, with wound vac drainage   Liver abscess s/p IR drainage, hepatic drain to be removed  Bacteremia     -Discussed with case management regarding discharge disposition - wife needs to set up financial assistance for transfer of patient to [place] . Other option could be to go to an SNF in [facility] ) .     9/13:on Omnicef p.o. due to lack of IV access.   plan to continue Omnicef p.o. x2 more weeks  ending 8 weeks on 9/ 29.  9/15: Patient stable   9/16: Medically stable   Awaiting placement   continue Omnicef p.o.until  on 9/ 29.  9/17  2 episodes of fever 100.4-relieved with Tylenol  No gross symptoms of infection  Examination unremarkable  Patient on on antibiotics-we will monitor for the fever  If persistent high-grade fever will start work-up  ID following  9/18  Remained afebrile overnight  No new leukocyte noted on CBC  Continue with antibiotic as per ID  9/19:  No acute issues, continue with the same management    Antimicrobial orders for discharge  -Omnicef 300 mg p.o. daily end date 9/29  -- Pt will require repeat imaging of liver before conclusion of therapy  -Weekly CBC, CMP-  -Encourage adequate fluids, daily probiotic/yogurt  -ID follow-up - if pt remains in [place] call [phone]      #Acute hypoxemic respiratory failure:  Intubated 7/31,  extubation 8/7  -Reintubated 8/13, extubated on 8/19  -Currently on 2 liters [place]  -Weaned off o2      #Dysphagia:  -Ongoing SLP evaluation-patient tolerating small diet .   -Aspiration on FEES study  -Follow-up SLP evaluation     #Acute kidney injury:  -Now on HD  -Continue with HD  -Nephrology following  -Case management informed the need to set up HD at SNF prior to discharge .       #Bilateral lower extremity critical limb ischemia  Gangrene both feet  -Podiatry and vascular surgery following  Cleared by podiatry for demarcation+ autoamputation  9/19: Appreciated wound care recommendation  Podiatry evaluation today       #Diabetes mellitus:  -Sliding scale and lantus  -Monitor FS  -Diabetes team following     #Sacral wound:  -Wound care following    Pending placement        Medical Decision Making:   I personally reviewed labs:   I personally reviewed imaging:  I personally reviewed EKG:  Toxic drug monitoring:   Discussed case with: Patient , RN        Code Status: Full   DVT Prophylaxis: Heparin   GI Prophylaxis:    Subjective:     Chief Complaint / Reason for Physician Visit  "Follow up case. No  active complaints. ".  Discussed with RN events overnight.       Objective:     VITALS:   Last 24hrs VS reviewed since prior progress note. Most recent are:  Patient Vitals for the past 24 hrs:   BP Temp Temp src Pulse Resp SpO2   07/21/22 0719 (!) 141/72 97.8 F (36.6 C) Oral 83 17 97 %   07/21/22 0351 (!) 149/71 98.3 F (36.8 C) Oral 89 16 95 %   07/20/22 2100 135/64 98.5 F (36.9 C) Oral 94 16 97 %   07/20/22 1512 130/70 97.1 F (36.2 C) Oral 91 16 98 %   07/20/22 1145 131/69 -- -- 90 -- --   07/20/22 1120 99/83 98.3 F (36.8 C) Axillary 85 17 99 %   07/20/22 1054 (!) 149/78 97.6 F (36.4 C) -- 86 18 --   07/20/22 1045 (!) 151/74 -- -- 85 -- --   07/20/22 1030 (!) 155/75 -- -- 85 -- --   07/20/22 1015 (!) 146/79 -- -- 85 -- --   07/20/22 1000 (!) 154/79 -- -- 85 -- --   07/20/22 0945 (!) 140/75 -- -- 85 -- --    07/20/22 0930 (!) 145/77 -- -- 85 -- --   07/20/22 0915 (!) 148/78 -- -- 83 -- --   07/20/22 0900 (!) 151/81 -- -- 81 -- --   07/20/22 0845 (!) 143/72 -- -- 83 -- --   07/20/22 0830 120/63 -- -- 81 -- --         Intake/Output Summary (Last 24 hours) at 07/21/2022 0827  Last data filed at 07/21/2022 0606  Gross per 24 hour   Intake 500 ml   Output 1700 ml   Net -1200 ml          I had a face to face encounter and independently examined this patient on 07/21/2022, as outlined below:  PHYSICAL EXAM:  General: Alert, cooperative  EENT:  EOMI. Anicteric sclerae..Rt. hd catn - jugular   Resp:  CTA bilaterally, no wheezing or rales.  No accessory muscle use  CV:  Regular  rhythm,  No edema  GI:  Soft, Non distended, Non tender.  +Bowel sounds, wound vac in place , drain on rt upper quadrant   Neurologic:  Alert and oriented X 3, normal speech,   Psych:   Good insight. Not anxious nor agitated  Foot :  bilateral feet - gangrene of most of the toes  , not yet full demarcation     Reviewed most current lab test results and cultures  YES  Reviewed most current radiology test results   YES  Review and summation of old records today    NO  Reviewed patient's current orders and MAR    YES  PMH/SH reviewed - no change compared to H&P  ________________________________________________________________________  Care Plan discussed with:    Comments   Patient x    Family      RN x    Care Manager     Consultant                        Multidiciplinary team rounds were held today with case manager, nursing, pharmacist and Occupational psychologist.  Patient's plan of care was discussed; medications were reviewed and discharge planning was addressed.     ________________________________________________________________________  Total NON critical care TIME:  25  Minutes    Total CRITICAL CARE TIME Spent:   Minutes non  procedure based      Comments   >50% of visit spent in counseling and coordination of care      ________________________________________________________________________  Jerilynn Birkenhead, MD     Procedures: see electronic medical records for all procedures/Xrays and details which were not copied into this note but were reviewed prior to creation of Plan.      LABS:  I reviewed today's most current labs and imaging studies.  Pertinent labs include:  Recent Labs     07/20/22  0801   WBC 9.5   HGB 8.6*   HCT 26.9*   PLT 193       Recent Labs     07/20/22  0801   NA 135*   K 3.4*   CL 100   CO2 30   GLUCOSE 119*   BUN 28*   CREATININE 4.52*   CALCIUM 8.1*   MG 2.9*   PHOS 3.2         Signed: Jerilynn Birkenhead, MD

## 2022-07-21 NOTE — Plan of Care (Signed)
Problem: Safety - Adult  Goal: Free from fall injury  Outcome: Progressing     Problem: Respiratory - Adult  Goal: Achieves optimal ventilation and oxygenation  Outcome: Progressing     Problem: Pain  Goal: Verbalizes/displays adequate comfort level or baseline comfort level  Outcome: Progressing     Problem: Discharge Planning  Goal: Discharge to home or other facility with appropriate resources  Outcome: Progressing     Problem: Chronic Conditions and Co-morbidities  Goal: Patient's chronic conditions and co-morbidity symptoms are monitored and maintained or improved  Outcome: Progressing     Problem: Neurosensory - Adult  Goal: Achieves maximal functionality and self care  Outcome: Progressing     Problem: Cardiovascular - Adult  Goal: Maintains optimal cardiac output and hemodynamic stability  Outcome: Progressing  Goal: Absence of cardiac dysrhythmias or at baseline  Outcome: Progressing     Problem: Gastrointestinal - Adult  Goal: Maintains or returns to baseline bowel function  Outcome: Progressing  Goal: Maintains adequate nutritional intake  Outcome: Progressing     Problem: Genitourinary - Adult  Goal: Absence of urinary retention  Outcome: Progressing     Problem: Metabolic/Fluid and Electrolytes - Adult  Goal: Electrolytes maintained within normal limits  Outcome: Progressing  Goal: Hemodynamic stability and optimal renal function maintained  Outcome: Progressing  Goal: Glucose maintained within prescribed range  Outcome: Progressing     Problem: Skin/Tissue Integrity - Adult  Goal: Skin integrity remains intact  Outcome: Progressing  Goal: Incisions, wounds, or drain sites healing without S/S of infection  Outcome: Progressing  Goal: Oral mucous membranes remain intact  Outcome: Progressing     Problem: Hematologic - Adult  Goal: Maintains hematologic stability  Outcome: Progressing     Problem: Musculoskeletal - Adult  Goal: Return mobility to safest level of function  Outcome: Progressing  Goal:  Maintain proper alignment of affected body part  Outcome: Progressing  Goal: Return ADL status to a safe level of function  Outcome: Progressing     Problem: Skin/Tissue Integrity  Goal: Absence of new skin breakdown  Description: 1.  Monitor for areas of redness and/or skin breakdown  2.  Assess vascular access sites hourly  3.  Every 4-6 hours minimum:  Change oxygen saturation probe site  4.  Every 4-6 hours:  If on nasal continuous positive airway pressure, respiratory therapy assess nares and determine need for appliance change or resting period.  Outcome: Progressing     Problem: ABCDS Injury Assessment  Goal: Absence of physical injury  Outcome: Progressing     Problem: Physical Therapy - Adult  Goal: By Discharge: Performs mobility at highest level of function for planned discharge setting.  See evaluation for individualized goals.  Description: FUNCTIONAL STATUS PRIOR TO ADMISSION: Patient was independent and active without use of DME. Drove himself to [place] from [place] for [place] race. Denies home O2 use.     HOME SUPPORT PRIOR TO ADMISSION: The patient lived with wife however wife has her own medical issues and cannot provide physical assist.    Physical Therapy Goals  Re-assessed 07/21/22; goals remain appropriate  Re-assessed 07/14/22; goals remain appropriate  Re-Assessment 07/07/22 remain appropriate  1.  Patient will move from supine to sit and sit to supine, scoot up and down, and roll side to side in bed with mod assist  within 7 day(s).   2. Patient will sit EOB x 15 minutes with contact guard assist within 7 days.    3.  Patient  will perform sit to stand with maximal assistance x2 within 7 day(s).  4.  Patient will transfer from bed to chair and chair to bed via minimal lift equipment (best mover) within 7 day(s).    Re-Assessment 06/29/22  1.  Patient will move from supine to sit and sit to supine, scoot up and down, and roll side to side in bed with maximal assist of 1 within 7 day(s).    2. Patient will sit EOB x 15 minutes with contact guard assist within 7 days.    3.  Patient will perform sit to stand with maximal assistance x2 within 7 day(s).  4.  Patient will transfer from bed to chair and chair to bed via minimal lift equipment (best mover) within 7 day(s).    Reassessed 06/20/2022   1.  Patient will move from supine to sit and sit to supine, scoot up and down, and roll side to side in bed with maximal assist of 1 within 7 day(s).   2. Patient will sit EOB x 5 minutes with contact guard assist within 7 days.    3.  Patient will perform sit to stand with maximal assistance x2 within 7 day(s).  4.  Patient will transfer from bed to chair and chair to bed via minimal lift equipment (best mover) within 7 day(s).    Initiated 06/10/2022  1.  Patient will move from supine to sit and sit to supine, scoot up and down, and roll side to side in bed with contact guard assist within 7 day(s).   2. Patient will sit EOB x 10 minutes with supervision/set-up assist within 7 days.    2.  Patient will perform sit to stand with moderate assistance x2 within 7 day(s).  3.  Patient will transfer from bed to chair and chair to bed with moderate assistance x2 using the least restrictive device within 7 day(s).  4.  Patient will ambulate with moderate assistance x2 for 5 feet with the least restrictive device within 7 day(s).   07/21/2022 1645 by April Johnson, PT  Outcome: Progressing     Problem: Occupational Therapy - Adult  Goal: By Discharge: Performs self-care activities at highest level of function for planned discharge setting.  See evaluation for individualized goals.  Description: FUNCTIONAL STATUS PRIOR TO ADMISSION:     ADL Assistance: Independent, Ambulation Assistance: Independent, Transfer Assistance: Independent, Active Driver: Yes     HOME SUPPORT: Patient lived with spouse and was independent.    Occupational Therapy Goals:  Initiated 06/10/2022; Re-Evaluation 06/20/2022.    Weekly Reassessment 06/29/22  - Continue as below. Goal #6 added. Goals reviewed and revised PRN as per 9/7 weekly reevaluation.   Goals reviewed and revised 9-14  1.  Patient will perform grooming at bed level with Moderate Assist within 7 day(s). Continue goal.  2.  Patient will perform self-feeding with Moderate Assist within 7 day(s). Continue goal. Met: upgrade to mod I in 7 days  3.  Patient will perform upper body dressing with Maximal Assist within 7 day(s). Continue goal.   4.  Patient will complete supine>sit with mod assist x2  within 7 day(s). Goal met, change to min A of 1.   5.  Patient will tolerate sitting EOB with fair sitting balance in preparation for ADLs within 7 day(s). Goal  met, change to good.  6.  Patient will participate in BUE therapeutic activity / exercise with Minimal Assist within 7 day(s). Goal met, change to S distally and  min A proximally within 7 days. met  7.  Patient will participate in cog screening to clarify discharge planning within 7 days      07/21/2022 1716 by Greg Cutter, OTR/L  Outcome: Progressing     Problem: Safety - Medical Restraint  Goal: Remains free of injury from restraints (Restraint for Interference with Medical Device)  Description: INTERVENTIONS:  1. Determine that other, less restrictive measures have been tried or would not be effective before applying the restraint  2. Evaluate the patient's condition at the time of restraint application  3. Inform patient/family regarding the reason for restraint  4. Q2H: Monitor safety, psychosocial status, comfort, nutrition and hydration  Outcome: Progressing       Pt is resting in bed. Alert and oriented x 4. Turned Q2-3 hours. New abd dressing WNL. VSS. Given Tylenol this morning. Had several small BM today. Reports pain only when [facility] is 45-60 degrees and is supine. Call bell in reach.

## 2022-07-21 NOTE — Progress Notes (Signed)
[facility]  [facility]    Progress Note    Date:07/22/2022       Room:2112/01  Patient Name:Jaime [name]     Date of Birth:[birth year]     Age:76 y.o.    Subjective    Subjective   Pt seen at [facility]. Denies any new complaints. Per nursing, no acute overnight events      Review of Systems  Constitutional: No fever, No chills, No sweats, + weakness, No fatigue  Respiratory: No shortness of breath, No cough, No sputum production, No hemoptysis, No wheezing, No cyanosis.  Cardiovascular: No chest pain, No palpitations, No bradycardia, No tachycardia, + peripheral edema, No syncope.  Gastrointestinal: No nausea, No vomiting, No diarrhea, No constipation, No heartburn, No abdominal pain.  Endocrine: No excessive thirst, No polyuria, No cold intolerance, No heat intolerance, No excessive hunger.  Immunologic: No immunocompromised, No recurrent fevers, No recurrent infections.  Musculoskeletal: No back pain, No neck pain, No joint pain, No muscle pain, No claudication, No decreased range of motion, No trauma.  Integumentary: No rash, No pruritus, No abrasions.  Neurologic: Alert, No abnormal balance, No headache, No confusion, No numbness, No tingling.  Psychiatric: No anxiety, No depression, No mania.      Objective         Vitals Last 24 Hours:  TEMPERATURE:  Temp  Avg: 98.2 F (36.8 C)  Min: 97.5 F (36.4 C)  Max: 98.5 F (36.9 C)  RESPIRATIONS RANGE: Resp  Avg: 18.2  Min: 17  Max: 20  PULSE OXIMETRY RANGE: SpO2  Avg: 95 %  Min: 90 %  Max: 98 %  PULSE RANGE: Pulse  Avg: 91.4  Min: 81  Max: 110  BLOOD PRESSURE RANGE: Systolic (24hrs), Avg:129 , Min:115 , Max:148   ; Diastolic (24hrs), Avg:67, Min:58, Max:77    I/O (24Hr):  No intake or output data in the 24 hours ending 07/22/22 0845    Objective  GEN: Pt is AAOx3 and in NAD. Heels elevated. No family at [facility]  DERM:Bilateral dry gangrenous changes noted to the toes. No proximal lymphatic streaking. No drainage or malodor  VASC: Pedal  pulses (DP/PT) diminished to B/L LE. Skin temp is warm to cool from proximal to distal for B/L LE. Neg homans/pratts signs to B/L LE. No varicosities or telangectasias noted to B/L LE.  NEURO: Protective and epicritic sensations grossly absent to B/L LE  MSK: (-) POP, No gross deformities. Decreased muscle tone and bulk noted to B/L LE.  PSYCH: Cooperative with normal mood/affect      Labs/Imaging/Diagnostics    Labs:  CBC:  Recent Labs     07/20/22  0801 07/22/22  0734   WBC 9.5 10.9   RBC 2.67* 2.54*   HGB 8.6* 8.1*   HCT 26.9* 25.7*   MCV 100.7* 101.2*   RDW 15.6* 15.6*   PLT 193 187     CHEMISTRIES:  Recent Labs     07/20/22  0801 07/22/22  0734   NA 135* 134*   K 3.4* 3.5   CL 100 99   CO2 30 31   BUN 28* 27*   CREATININE 4.52* 3.88*   GLUCOSE 119* 127*   PHOS 3.2 2.9   MG 2.9*  --      PT/INR:No results for input(s): "PROTIME", "INR" in the last 72 hours.  APTT:No results for input(s): "APTT" in the last 72 hours.  LIVER PROFILE:No results for input(s): "AST", "ALT", "BILIDIR", "BILITOT", "  ALKPHOS" in the last 72 hours.    Imaging Last 24 Hours:  XR CHEST PORTABLE    Result Date: 07/20/2022  EXAM: Portable CXR. INDICATION: serial monitoring FINDINGS: Right IJ CVC tip is at the superior cavoatrial junction without pneumothorax. Lung volumes are low without consolidation. Heart size is normal. There is no pulmonary edema or apparent pleural effusion.     No acute finding.      Assessment//Plan           Hospital Problems             Last Modified POA    * (Principal) Gastric perforation (HCC) 06/02/2022 Yes    Type 2 diabetes mellitus with hyperglycemia, without long-term current use of insulin (HCC) 07/20/2022 Yes    Intestinal obstruction (HCC) 06/05/2022 Yes    Severe sepsis (HCC) 06/05/2022 Yes    Septic shock (HCC) 06/08/2022 Yes    E coli infection 07/20/2022 Yes    Liver abscess 06/05/2022 Yes    Pneumoperitoneum 06/05/2022 Yes    Acute respiratory failure with hypoxia (HCC) 06/05/2022 Yes    AKI (acute kidney injury)  (HCC) 06/05/2022 Yes    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 Yes    Thrombocytopenia (HCC) 06/05/2022 Yes    E coli bacteremia 06/08/2022 Yes    Hepatic abscess 06/08/2022 Yes    Peripheral arterial disease (HCC) 06/08/2022 Yes    Hyperbilirubinemia 06/08/2022 Yes    Gram negative sepsis (HCC) 06/08/2022 Yes    Septicemia (HCC) 06/09/2022 Yes    Aspiration pneumonia of both lower lobes (HCC) 06/12/2022 Yes    Gangrene of toe of both feet (HCC) 06/12/2022 Yes    Bandemia 06/12/2022 Yes    Acute pancreatitis 06/18/2022 Yes    Wound dehiscence 06/22/2022 Yes     Assessment & Plan    Gangrene, toes to bilateral feet 2/2 recent pressor support  DM T2        Patient seen and evaluated at bedside in the ICU  - Current labs personally reviewed and discussed findings with pt  - Tx options reviewed, conservative vs surgical. Possible complications discussed. Pt has elected for surgical intervention (B/L TMAs). In depth convo had regarding preop, intraop and postop surgical protocols. Plan for the procedures Thursday afternoon as he states he has dialysis tomorrow and would dialysis makes him very tired and he would not like to have surgery the same day as dialysis. NPO after midnight Wednesday and consent to be signed/witnessed   - Cont local wound care and offloading while in bed     WB Status: As tolerated for transfers to B/L LE  Wound Care: Paint with all toes with betadine daily              Normon Pettijohn A. Undrea Shipes, DPM, CWSP, AACFAS    [facility]  [address] [place], [place] [address]  O: [phone]  F: [phone]     [facility] (Opening Sept 2023)  [address] [place]  [place], [place] [address]  O: [phone]  F: [phone]     [facility]  [address]  [place], [place] [address]  O: [phone]  F: [phone]     *  Available via [facility] 24/7    Electronically signed by Rosana Fret, DPM on 07/22/22  at 8:45 AM EDT

## 2022-07-21 NOTE — Plan of Care (Signed)
Problem: Physical Therapy - Adult  Goal: By Discharge: Performs mobility at highest level of function for planned discharge setting.  See evaluation for individualized goals.  Description: FUNCTIONAL STATUS PRIOR TO ADMISSION: Patient was independent and active without use of DME. [place]. Denies home O2 use.     HOME SUPPORT PRIOR TO ADMISSION: The patient lived with wife however wife has her own medical issues and cannot provide physical assist.    Physical Therapy Goals  Re-assessed 07/21/22; goals remain appropriate  Re-assessed 07/14/22; goals remain appropriate  Re-Assessment 07/07/22 remain appropriate  1.  Patient will move from supine to sit and sit to supine, scoot up and down, and roll side to side in bed with mod assist  within 7 day(s).   2. Patient will sit EOB x 15 minutes with contact guard assist within 7 days.    3.  Patient will perform sit to stand with maximal assistance x2 within 7 day(s).  4.  Patient will transfer from bed to chair and chair to bed via minimal lift equipment (best mover) within 7 day(s).    Re-Assessment 06/29/22  1.  Patient will move from supine to sit and sit to supine, scoot up and down, and roll side to side in bed with maximal assist of 1 within 7 day(s).   2. Patient will sit EOB x 15 minutes with contact guard assist within 7 days.    3.  Patient will perform sit to stand with maximal assistance x2 within 7 day(s).  4.  Patient will transfer from bed to chair and chair to bed via minimal lift equipment (best mover) within 7 day(s).    Reassessed 06/20/2022   1.  Patient will move from supine to sit and sit to supine, scoot up and down, and roll side to side in bed with maximal assist of 1 within 7 day(s).   2. Patient will sit EOB x 5 minutes with contact guard assist within 7 days.    3.  Patient will perform sit to stand with maximal assistance x2 within 7 day(s).  4.  Patient will transfer from bed to chair and chair  to bed via minimal lift equipment (best mover) within 7 day(s).    Initiated 06/10/2022  1.  Patient will move from supine to sit and sit to supine, scoot up and down, and roll side to side in bed with contact guard assist within 7 day(s).   2. Patient will sit EOB x 10 minutes with supervision/set-up assist within 7 days.    2.  Patient will perform sit to stand with moderate assistance x2 within 7 day(s).  3.  Patient will transfer from bed to chair and chair to bed with moderate assistance x2 using the least restrictive device within 7 day(s).  4.  Patient will ambulate with moderate assistance x2 for 5 feet with the least restrictive device within 7 day(s).   Outcome: Progressing   PHYSICAL THERAPY TREATMENT: WEEKLY REASSESSMENT    Patient: Jaime [name] ([age] y.o. male)  Date: 07/21/2022  Primary Diagnosis: Septicemia (HCC) [A41.9]  Gastric perforation (HCC) [K25.5]  Perforated abdominal viscus [R19.8]  Procedure(s) (LRB):  LAPAROTOMY EXPLORATORY (N/A)  LAPAROSCOPY DIAGNOSTIC (N/A)  ENDOSCOPY OF ILEAL CONDUIT (N/A) 50 Days Post-Op   Precautions: Fall Risk, General Precautions (resp failure 05-31-22; skin- pressure wounds sacral/buttocks, necrosis B feet, Impaired B shoulder PROM/general debility; drain R side)  ASSESSMENT :  Patient continues to benefit from skilled PT services and is slowly progressing towards goals, overall functional progress remaining limited by gross generalized weakness and anxiety with mobilization. Patient demonstrates improved bed mobility, requiring Mod A for supine>sit and Min A with sit>supine, improved eccentric control following cuing. Demonstrates fair sitting balance and tolerance, sitting EOB for about 20 minutes. Continues to demonstrate increased sway with general unsteadiness though improved ability to self correct during LOB. Patient performing seated reaching across midline and in anterior direction with patient demonstrating increased anxiousness during this  activity require max encouragement. Fatigues rapidly requiring multiple rest breaks and only able to tolerate a couple reps before requiring rest. Attempted sit<>stand transfer with patient reaching/holding onto bar on back of chair, once setup in this position patient becoming increasingly anxious and unable to redirect. Abandoned attempt to stand as patient demonstrating compromised trunk control and limited reserve to continue mobility. Returned to supine with all needs in reach. Patient continues to be well below his baseline functional status and will require extensive rehab at discharge in order to improve mobility status, patient remains very motivated to improve mobility status.     Patient's progression toward goals since last assessment: Progressing toward all goals, bed mobility Mod A this session but not consistently; able to sit EOB x20 min though intermittent needing Mod A for steadying    Functional Outcome Measure:  The patient scored 9/24 on the AM-PAC outcome measure       PLAN :  Goals have been updated based on progression since last assessment.  Patient continues to benefit from skilled intervention to address the above impairments.    Recommendations and Planned Interventions:   bed mobility training, transfer training, therapeutic exercises, neuromuscular re-education, patient and family training/education, and therapeutic activities    Frequency/Duration: Patient will be followed by physical therapy to address goals, PT Plan of Care: 4 times/week  per day/week to address goals.    Recommendation for discharge: (in order for the patient to meet his/her long term goals): Therapy 3 hours/day 5-7 days/week    Other factors to consider for discharge: no local support, patient's current support system is unable to meet their requirements for physical assistance, high risk for falls, not safe to be alone, and concern for safely navigating or managing the home environment    IF patient discharges  home will need the following DME: continuing to assess with progress     SUBJECTIVE:   Patient stated "I am just scared to fall."    OBJECTIVE DATA SUMMARY:     History reviewed. No pertinent past medical history.  Past Surgical History:   Procedure Laterality Date    BLADDER SURGERY N/A 06/01/2022    ENDOSCOPY OF ILEAL CONDUIT performed by Melene Plan, MD at [facility]    CT VISCERAL PERCUTANEOUS DRAIN  06/05/2022    CT VISCERAL PERCUTANEOUS DRAIN 06/05/2022 [facility] CT    IR TUNNELED CATHETER PLACEMENT GREATER THAN 5 YEARS  07/01/2022    IR TUNNELED CATHETER PLACEMENT GREATER THAN 5 YEARS 07/01/2022 [facility] [place], APRN - NP [facility] ANGIO IR    LAPAROSCOPY N/A 06/01/2022    LAPAROSCOPY DIAGNOSTIC performed by Melene Plan, MD at [place] N/A 06/01/2022    LAPAROTOMY EXPLORATORY performed by Melene Plan, MD at [facility]       Home Situation:  Social/Functional History  Lives With: Spouse  Type of Home: House  Home Layout: One  level  Home Equipment: None  ADL Assistance: Independent  Ambulation Assistance: Independent  Transfer Assistance: Independent  Active Driver: Yes  Critical Behavior:  Orientation  Overall Orientation Status: Within Normal Limits  Cognition  Overall Cognitive Status: WFL (anxious)    Hearing:   Hearing  Hearing: Within functional limits    Functional Mobility:  Bed Mobility:  Bed Mobility Training  Bed Mobility Training: Yes  Interventions: Verbal cues;Manual cues  Rolling: Minimum assistance  Supine to Sit: Moderate assistance;Additional time  Sit to Supine: Minimum assistance;Additional time  Scooting: Moderate assistance;Additional time  Transfers:  Art therapist: No  Balance:   Balance  Sitting: Impaired  Sitting - Static: Fair (occasional);Supported sitting  Sitting - Dynamic: Fair (occasional)  Standing:  (unable to assess)                                                                                                                                                                                                                                [employer] AM-PAC      Basic Mobility Inpatient Short Form (6-Clicks) Version 2  How much HELP from another person do you currently need... (If the patient hasn't done an activity recently, how much help from another person do you think they would need if they tried?) Total A Lot A Little None   1.  Turning from your back to your side while in a flat bed without using bedrails? []   1 []   2 [x]   3  []   4   2.  Moving from lying on your back to sitting on the side of a flat bed without using bedrails? []   1 [x]   2 []   3  []   4   3.  Moving to and from a bed to a chair (including a wheelchair)? [x]   1 []   2 []   3  []   4   4. Standing up from a chair using your arms (e.g. wheelchair or bedside chair)? [x]   1 []   2 []   3  []   4   5.  Walking in hospital room? [x]   1 []   2 []   3  []   4   6.  Climbing 3-5 steps with a railing? [x]   1 []   2 []   3  []   4     Raw Score: 9/24  Cutoff score ?171,2,3 had higher odds of discharging home with home health or need of SNF/IPR.    1. Emelia Loron, Janeece Riggers, Vinoth Fransico Meadow, Lupe Carney Passek, Thornton Dales. Cassandria Anger.  Validity of the AM-PAC "6-Clicks" Inpatient Daily Activity and Basic Mobility Short Forms. Physical Therapy Mar 2014, [phone]) 379-391; DOI: 10.2522/ptj.20130199  2. Venetia Night. Association of AM-PAC "6-Clicks" Basic Mobility and Daily Activity Scores With Discharge Destination. Phys Ther. 2021 Apr 4;101(4):pzab043. doi: 10.1093/ptj/pzab043. PMID: 16384536.  3. Herbold J, Rajaraman D, Lubertha Basque, Agayby K, Newton S. Activity Measure for Post-Acute Care "6-Clicks" Basic Mobility Scores Predict Discharge Destination After Acute Care Hospitalization in Select Patient Groups: A Retrospective, Observational Study. Arch Rehabil Res Clin Transl. 2022 Jul 16;4(3):100204. doi: 10.1016/j.arrct.4680.321224.  PMID: 82500370; PMCID: WUG8916945.  4. Josefina Do, Coster W, Ni P. AM-PAC Short Forms Manual 4.0. Revised 12/2018.                                                                                                                                                                                                                       Activity Tolerance:   Fair , requires frequent rest breaks, observed shortness of breath on exertion, and SpO2 stable on room air    After treatment:   Patient left in no apparent distress in bed, Call bell within reach, Bed/ chair alarm activated, and Side rails x3    COMMUNICATION/EDUCATION:   The patient's plan of care was discussed with: occupational therapist, registered nurse, and physician    Patient Education  Education Given To: Patient  Education Provided: Plan of Care;Home Exercise Program;Fall Prevention Strategies;Transfer Training  Education Method: Verbal  Barriers to Learning: None (anxiety with mobility)  Education Outcome: Verbalized understanding;Continued education needed    Thank you for this referral.  Herschell Virani, PT  Minutes: 38

## 2022-07-21 NOTE — Progress Notes (Signed)
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    Needs outpatient HD placement under ESKD diagnosis - CM working on options (here or back in [place])    Watch for renal recovery, but none so far.    H/H not at goal.  Continue ESA.                Subjective:     Chief Complaint: Resting. We discussed the above. No complaint, need or questions.      Review of Systems:    Symptom Y/N Comments  Symptom Y/N Comments   Fever/Chills    Chest Pain     Poor Appetite    Edema     Cough    Abdominal Pain     Sputum    Joint Pain     SOB/DOE    Pruritis/Rash     Nausea/vomit    Tolerating PT/OT     Diarrhea    Tolerating Diet     Constipation    Other       Could not obtain due to:      Objective:     VITALS:   Last 24hrs VS reviewed since prior progress note. Most recent are:  Vitals:    07/21/22 0351   BP: (!) 149/71   Pulse: 89   Resp: 16   Temp: 98.3 F (36.8 C)   SpO2: 95%       Intake/Output Summary (Last 24 hours) at 07/21/2022 0545  Last data filed at 07/20/2022 1512  Gross per 24 hour   Intake 500 ml   Output 1750 ml   Net -1250 ml        Telemetry Reviewed:     PHYSICAL EXAM:  General: NAD  trace edema  R TDC intact  B/l toes- dry gangrene      Lab Data Reviewed: (see below)    Medications Reviewed: (see below)    PMH/SH reviewed - no change compared to H&P  ________________________________________________________________________  Care Plan discussed with:  Patient     Family      RN     Care Manager                    Consultant:          Comments   >50% of visit spent in counseling and coordination of care       ________________________________________________________________________  Tanna Savoy, MD     Procedures:  see electronic medical records for all procedures/Xrays and details which  were not copied into this note but were reviewed prior to creation of Plan.      LABS:  Recent Labs     07/20/22  0801   WBC 9.5   HGB 8.6*   HCT 26.9*   PLT 193  Recent Labs     07/20/22  0801   NA 135*   K 3.4*   CL 100   CO2 30   BUN 28*   MG 2.9*   PHOS 3.2         MEDICATIONS:  Current Facility-Administered Medications   Medication Dose Route Frequency    oxyCODONE (ROXICODONE) immediate release tablet 5 mg  5 mg Oral Q4H PRN    diphenhydrAMINE (BENADRYL) capsule 25 mg  25 mg Oral Q6H PRN    cefdinir (OMNICEF) capsule 300 mg  300 mg Oral Daily    sevelamer (RENVELA) packet 2.4 g  2.4 g Oral TID WC    heparin (porcine) 1000 UNIT/ML injection 1,800 Units  1,800 Units IntraCATHeter PRN    And    heparin (porcine) 1000 UNIT/ML injection 1,800 Units  1,800 Units IntraCATHeter PRN    guaiFENesin (ROBITUSSIN) 100 MG/5ML liquid 200 mg  200 mg Oral Q4H PRN    heparin (porcine) injection 5,000 Units  5,000 Units SubCUTAneous 3 times per day    therapeutic multivitamin-minerals 1 tablet  1 tablet Per NG tube Daily    amiodarone (CORDARONE) tablet 200 mg  200 mg Per NG tube Daily    acidophilus probiotic capsule 1 capsule  1 capsule Oral Daily    lidocaine 2 % injection 20 mL  20 mL IntraDERmal Once    collagenase ointment   Topical Daily    epoetin alfa-epbx (RETACRIT) injection 6,000 Units  6,000 Units SubCUTAneous Once per day on Mon Wed Fri    fentaNYL (SUBLIMAZE) injection 25 mcg  25 mcg IntraVENous Q1H PRN    midodrine (PROAMATINE) tablet 5 mg  5 mg Oral Daily PRN    insulin lispro (HUMALOG) injection vial 0-4 Units  0-4 Units SubCUTAneous 4 times per day    bisacodyl (DULCOLAX) suppository 10 mg  10 mg Rectal Daily PRN    albumin human 25% IV solution 25 g  25 g IntraVENous PRN    sodium chloride 0.9 % bolus 100 mL  100 mL IntraVENous PRN    balsum peru-castor oil (VENELEX) ointment   Topical BID    glucose chewable tablet 16 g  4 tablet  Oral PRN    dextrose bolus 10% 125 mL  125 mL IntraVENous PRN    Or    dextrose bolus 10% 250 mL  250 mL IntraVENous PRN    glucagon (rDNA) injection 1 mg  1 mg SubCUTAneous PRN    dextrose 10 % infusion   IntraVENous Continuous PRN    sodium chloride flush 0.9 % injection 5-40 mL  5-40 mL IntraVENous 2 times per day    sodium chloride flush 0.9 % injection 5-40 mL  5-40 mL IntraVENous PRN    0.9 % sodium chloride infusion   IntraVENous PRN    acetaminophen (TYLENOL) tablet 650 mg  650 mg Oral Q6H PRN    Or    acetaminophen (TYLENOL) suppository 650 mg  650 mg Rectal Q6H PRN    ondansetron (ZOFRAN) injection 4 mg  4 mg IntraVENous Q6H PRN

## 2022-07-21 NOTE — Wound Image (Signed)
Wound Care follow up for the Mid abdominal wound and the sacrum wound today:  Chart reviewed and patient assessed.    Assessment: Pt. Is pleasant and bored.  Wants to get moving again. PT and OT seeing him some but not as often as the patient would like. Maybe due to scheduling around dialysis and such.   Patient is doing better overall now.   The sacrum wound is debriding nicely and nursing is keeping him off of the sacrum and buttocks and on his sides.  The air bed was turned on this afternoon. Unknown how long it was off. He was repositioned onto his sides.   The Abdominal wound is 100 % granulation tissue, healing nicely. The Wound Vac is able to be removed today and the wound care can be done daily by nursing - simple wound care with a slightly hydofera Blue packing and a post op foam dressing to cover it. Orders written for the same.  Treatment today: the wound was cleansed with Vashe solution and wiped with gauze. Hydrofera Blue was applied to the wound bed moistened with the Vashe solution and then covered with post op foam dressing.     Plan: wound care orders written today for change in the abdominal wound care.  Dr. Margaretann Loveless updated on the care.   Sacrum care to remain the same.   Podiatry is being re-consulted for the toes today.     Mid Abdominal wound - Now can be dressed  By nursing with [facility] and post op  Foam dressing.       Clyda Greener, RN, BSN, [employer]

## 2022-07-21 NOTE — Plan of Care (Signed)
Problem: Occupational Therapy - Adult  Goal: By Discharge: Performs self-care activities at highest level of function for planned discharge setting.  See evaluation for individualized goals.  Description: FUNCTIONAL STATUS PRIOR TO ADMISSION:     ADL Assistance: Independent, Ambulation Assistance: Independent, Transfer Assistance: Independent, Active Driver: Yes     HOME SUPPORT: Patient lived with spouse and was independent.    Occupational Therapy Goals:  Initiated 06/10/2022; Re-Evaluation 06/20/2022.    Weekly Reassessment 06/29/22 - Continue as below. Goal #6 added. Goals reviewed and revised PRN as per 9/7 weekly reevaluation.   Goals reviewed and revised 9-14  1.  Patient will perform grooming at bed level with Moderate Assist within 7 day(s). Continue goal.  2.  Patient will perform self-feeding with Moderate Assist within 7 day(s). Continue goal. Met: upgrade to mod I in 7 days  3.  Patient will perform upper body dressing with Maximal Assist within 7 day(s). Continue goal.   4.  Patient will complete supine>sit with mod assist x2  within 7 day(s). Goal met, change to min A of 1.   5.  Patient will tolerate sitting EOB with fair sitting balance in preparation for ADLs within 7 day(s). Goal  met, change to good.  6.  Patient will participate in BUE therapeutic activity / exercise with Minimal Assist within 7 day(s). Goal met, change to S distally and min A proximally within 7 days. met  7.  Patient will participate in cog screening to clarify discharge planning within 7 days      Outcome: Progressing    OCCUPATIONAL THERAPY TREATMENT  Patient: Jaime [name] (76 y.o. male)  Date: 07/21/2022  Primary Diagnosis: Septicemia ([place]) [A41.9]  Gastric perforation ([place]) [K25.5]  Perforated abdominal viscus [R19.8]  Procedure(s) (LRB):  LAPAROTOMY EXPLORATORY (N/A)  LAPAROSCOPY DIAGNOSTIC (N/A)  ENDOSCOPY OF ILEAL CONDUIT (N/A) 50 Days Post-Op   Precautions: Fall Risk, General Precautions (resp failure 05-31-22; skin-  pressure wounds sacral/buttocks, necrosis B feet, Impaired B shoulder PROM/general debility; drain R side)                Chart, occupational therapy assessment, plan of care, and goals were reviewed.    ASSESSMENT  Patient presented supine in bed, eager to work with therapy. He is generally cooperative t/o treatment, but his progress is greatly limited by onset of anxiety with any suggestion of attempting to have him bend forward and transfer sit to stand. Overall he was min to mod A for bed mobility, he was mod A to total A for seated LB dressing and he was able to participate in seated trunk and UE exercise with fair sitting balance. Patient remains unable to stand, becoming anxious as stated above, not able to even attempt to stand with a chair in front of him to pull on, in addition to the assist of 2 therapists. In addition to his anxiety the patient remains limited by GW, decreased activity tolerance, decreased safety awareness and decreased balance. At this time, the patient continues to be in need of acute OT services and would benefit from IPR after discharge.              PLAN :  Patient continues to benefit from skilled intervention to address the above impairments.  Continue treatment per established plan of care to address goals.      Recommendation for discharge: (in order for the patient to meet his/her long term goals): Therapy 3 hours/day 5-7 days/week      IF patient  discharges home will need the following DME: continuing to assess with progress         OBJECTIVE DATA SUMMARY:   Cognitive/Behavioral Status:  Orientation  Overall Orientation Status: Within Normal Limits  Orientation Level: Oriented X4  Cognition  Overall Cognitive Status: WFL  Following Commands: Follows all commands without difficulty    Functional Mobility and Transfers for ADLs:  Bed Mobility:  Bed Mobility Training  Bed Mobility Training: Yes  Interventions: Verbal cues;Manual cues  Rolling: Minimum assistance  Supine to Sit:  Moderate assistance;Additional time  Sit to Supine: Minimum assistance;Additional time  Scooting: Moderate assistance;Additional time     Transfers:   Pharmacologist: No      Balance:  Standing:  (unable to assess)  Balance  Sitting: Impaired  Sitting - Static: Fair (occasional);Supported sitting  Sitting - Dynamic: Fair (occasional)  Standing:  (unable to assess)      ADL Intervention:  UE Dressing: Moderate assistance;Dependent/Total (Mod A to don R sock using crossed leg technique seated EOB, dependent for L sock.)      EXERCISE   Sets   Reps   Active Active Assist   Passive   Comments   Scapular elevation/ depression 1 5 [x]            []            []            All exercises performed seated EOB   Scapular retraction 1 5 [x]            []            []               Trunk rotation  1 5 [x]            []            []               Cervical rotation 1 5 [x]            []            []               Shoulder flexion 1 5 [x]            []            []                        Activity Tolerance:   Poor    After treatment:   Patient left in no apparent distress in bed, Call bell within reach, and Bed/ chair alarm activated    COMMUNICATION/EDUCATION:   The patient's plan of care was discussed with: physical therapist and registered nurse    Patient Education  Education Provided: ADL Adaptive Strategies  Education Method: Verbal;Demonstration;Teach Back  Barriers to Learning: None  Education Outcome: Verbalized understanding    Thank you for this referral.  Amery Vandenbos P Sajjad Honea, OTR/L  Minutes: 89

## 2022-07-21 NOTE — Care Coordination-Inpatient (Signed)
Transition of Care Plan:     RUR:  %  Prior Level of Functioning: independent  Disposition: SNF in [place] with HD set up  If SNF or IPR: Date FOC offered: 06/30/22  Date FOC received: wife agrees with plan - prefers closer to [place]  Accepting facility: pending     Date authorization started with reference number:      Date authorization received and expires:   Follow up appointments: new PCP and Specialist in [place] (ID, nephrology, podiatry)  DME needed:   Transportation at discharge: [place] stretcher transport- CM spoke with   IM/IMM Medicare/Tricare letter given:   Is patient a Veteran and connected with VA?               If yes, was [employer] transfer form completed and VA notified?   Caregiver Contact:   Discharge Caregiver contacted prior to discharge?   Care Conference needed? no  Barriers to discharge: HD setup, PT/OT, insurance authorization, transportation to and from OP HD via stretcher        CM spoke with Susie wife to inform her that [facility] no longer accept patient - CM informed by another facility that [facility] takes HD patients  CM called ph# [phone] and spoke with [facility] - they have an OP HD that they partner with but they do not assist with stretcher transport to OP HD.  CM also called [facility] SNF  in [place] ph# [phone] - left VM for admissions.       CM also called [facility] in [place] - was provided with contact info bu Dora via email to [email or web address]  who will be in the office tomorrow to obtain assistance with SNF's in area that may be able to accommodate patient. Awaiting response at this time.  Chales Abrahams, MSW

## 2022-07-21 NOTE — Progress Notes (Signed)
Comprehensive Nutrition Assessment    Type and Reason for Visit:  Reassess    Nutrition Recommendations/Plan:   Continue current diet and supplements  Please document % meals and supplements consumed in flowsheet I/O's under intake      Malnutrition Assessment:  Malnutrition Status:  Insufficient data (06/04/22 1319)        Nutrition Assessment:     Chart reviewed and case discussed during IDR. Pt reports his appetite is the same, fair, but not great. He eats what he likes from the food that is provided. He has not tried calling the kitchen to order anything despite our conversation last visit. He is still only drinking 1 Nepro shake daily. We again discussed his increased nutrition needs for wound healing. Pt agreed to try working on a second Nepro shake daily. I encouraged him to call the kitchen to order something he wants, and he declined my offers to place an order for him. Continue to encourage intake of meals and supplements. Pt remains medically stable for discharge [place] of state.     Patient Vitals for the past 120 hrs:   PO Meals Eaten (%)   07/20/22 1802 76 - 100%   07/20/22 1153 51 - 75%   07/17/22 0745 1 - 25%   07/16/22 1625 26 - 50%     Wt Readings from Last 5 Encounters:   07/19/22 96.4 kg (212 lb 8.4 oz)   ]    Nutrition Related Findings:    No recent labs.   Meds: probiotic, renvela, MVI.   Trace edema all extremities. 2+ generalized.   BM 9/18.   Wound Type: Surgical Incision, Wound Vac, DTI, Arterial    Current Nutrition Intake & Therapies:    Average Meal Intake: 26-50%, 51-75%  Average Supplements Intake: 26-50%  ADULT DIET; Regular; 5 carb choices (75 gm/meal); Low Phosphorus (Less than 1000 mg)  ADULT ORAL NUTRITION SUPPLEMENT; Breakfast, Dinner; Renal Oral Supplement    Anthropometric Measures:  Height: 172.7 cm (5' 8" )  Ideal Body Weight (IBW): 154 lbs (70 kg)       Current Body Weight: 212 lb 8.4 oz (96.4 kg), 154.2 % IBW. Weight Source: Bed Scale  Current BMI (kg/m2):  32.3        Weight Adjustment For: No Adjustment                 BMI Categories: Obese Class 1 (BMI 30.0-34.9)    Estimated Daily Nutrient Needs:  Energy Requirements Based On: Formula  Weight Used for Energy Requirements: Current  Energy (kcal/day): MSJ 2150 (1795 x 1.2)  Weight Used for Protein Requirements: Ideal  Protein (g/day): 119-140g (1.7-2gPro/kg IBW)  Method Used for Fluid Requirements: Other (Comment)  Fluid (ml/day): per MD    Nutrition Diagnosis:   Inadequate protein-energy intake related to impaired respiratory function as evidenced by NPO or clear liquid status due to medical condition  Dx improving. Diet advanced but PO intake remains inadequate.     Nutrition Interventions:   Food and/or Nutrient Delivery: Continue Current Diet, Continue Oral Nutrition Supplement  Nutrition Education/Counseling: No recommendation at this time  Coordination of Nutrition Care: Continue to monitor while inpatient, Interdisciplinary Rounds       Goals:  Previous Goal Met: Progressing toward Goal(s)  Goals: PO intake 75% or greater, by next RD assessment       Nutrition Monitoring and Evaluation:   Behavioral-Environmental Outcomes: None Identified  Food/Nutrient Intake Outcomes: Food and Nutrient Intake, Supplement Intake  Physical Signs/Symptoms Outcomes: Biochemical Data, Nutrition Focused Physical Findings, Skin, Weight, GI Status, Hemodynamic Status, Fluid Status or Edema    Discharge Planning:    Continue current diet, Continue Oral Nutrition Supplement     Kristin Bruins, RD  Contact: [phone]

## 2022-07-22 LAB — CBC
Hematocrit: 25.7 % — ABNORMAL LOW (ref 36.6–50.3)
Hemoglobin: 8.1 g/dL — ABNORMAL LOW (ref 12.1–17.0)
MCH: 31.9 PG (ref 26.0–34.0)
MCHC: 31.5 g/dL (ref 30.0–36.5)
MCV: 101.2 FL — ABNORMAL HIGH (ref 80.0–99.0)
MPV: 9.8 FL (ref 8.9–12.9)
Nucleated RBCs: 0 PER 100 WBC
Platelets: 187 10*3/uL (ref 150–400)
RBC: 2.54 M/uL — ABNORMAL LOW (ref 4.10–5.70)
RDW: 15.6 % — ABNORMAL HIGH (ref 11.5–14.5)
WBC: 10.9 10*3/uL (ref 4.1–11.1)
nRBC: 0 10*3/uL (ref 0.00–0.01)

## 2022-07-22 LAB — POCT GLUCOSE
POC Glucose: 161 mg/dL — ABNORMAL HIGH (ref 65–117)
POC Glucose: 126 mg/dL — ABNORMAL HIGH (ref 65–117)
POC Glucose: 99 mg/dL (ref 65–117)
POC Glucose: 90 mg/dL (ref 65–117)
POC Glucose: 119 mg/dL — ABNORMAL HIGH (ref 65–117)

## 2022-07-22 LAB — RENAL FUNCTION PANEL
Albumin: 1.9 g/dL — ABNORMAL LOW (ref 3.5–5.0)
Anion Gap: 4 mmol/L — ABNORMAL LOW (ref 5–15)
BUN: 27 MG/DL — ABNORMAL HIGH (ref 6–20)
Bun/Cre Ratio: 7 — ABNORMAL LOW (ref 12–20)
CO2: 31 mmol/L (ref 21–32)
Calcium: 8.4 MG/DL — ABNORMAL LOW (ref 8.5–10.1)
Chloride: 99 mmol/L (ref 97–108)
Creatinine: 3.88 MG/DL — ABNORMAL HIGH (ref 0.70–1.30)
Est, Glom Filt Rate: 15 mL/min/{1.73_m2} — ABNORMAL LOW (ref >60)
Glucose: 127 mg/dL — ABNORMAL HIGH (ref 65–100)
Phosphorus: 2.9 MG/DL (ref 2.6–4.7)
Potassium: 3.5 mmol/L (ref 3.5–5.1)
Sodium: 134 mmol/L — ABNORMAL LOW (ref 136–145)

## 2022-07-22 MED ORDER — HEPARIN SODIUM (PORCINE) 1000 UNIT/ML IJ SOLN
1000 UNIT/ML | INTRAMUSCULAR | Status: AC
Start: 2022-07-22 — End: 2022-07-22

## 2022-07-22 MED FILL — SEVELAMER CARBONATE 2.4 G PO PACK: 2.4 g | ORAL | Qty: 2.4

## 2022-07-22 MED FILL — RENVELA 2.4 G PO PACK: 2.4 g | ORAL | Qty: 2.4

## 2022-07-22 MED FILL — HEPARIN SODIUM (PORCINE) 1000 UNIT/ML IJ SOLN: 1000 UNIT/ML | INTRAMUSCULAR | Qty: 10

## 2022-07-22 MED FILL — RETACRIT 3000 UNIT/ML IJ SOLN: 3000 UNIT/ML | INTRAMUSCULAR | Qty: 2

## 2022-07-22 MED FILL — RISAQUAD-2 PO CAPS: ORAL | Qty: 1

## 2022-07-22 MED FILL — CEFDINIR 300 MG PO CAPS: 300 MG | ORAL | Qty: 1

## 2022-07-22 MED FILL — THERA M PLUS PO TABS: ORAL | Qty: 1

## 2022-07-22 MED FILL — HEPARIN SODIUM (PORCINE) 5000 UNIT/ML IJ SOLN: 5000 UNIT/ML | INTRAMUSCULAR | Qty: 1

## 2022-07-22 MED FILL — AMIODARONE HCL 200 MG PO TABS: 200 MG | ORAL | Qty: 1

## 2022-07-22 NOTE — Progress Notes (Signed)
Occupational Therapy     Chart reviewed for updates and attempted to see patient for OT treatment. Patient is presently receiving HD.  Will follow up later today or tomorrow for OT treatment.     Latha Staunton P Vallie Teters, OTR/L

## 2022-07-22 NOTE — Progress Notes (Signed)
Chart reviewed and nursing cleared.     Patient introduced, instructions and agreeable to hoyer lift transfers to chair to increase sitting tolerance etc. 07/21/2022. Nursing staff agreeable and trained. Patient tolerated sitting in the lift via sling ~20 mins and then returned back to bed. Unable to get to chair as unsafe with setup.     Sling left in room so nursing can continue to lift patient to decrease weight on skin, up right posture and to a different chair. Therapy to continue to follow for skilled intervention of trunk control etc.   Total time 45 mins

## 2022-07-22 NOTE — Progress Notes (Addendum)
Received report from dialysis nurse at 1135. 500 ml were removed. Patient is alert and oriented and complains of no pain. Changed HD cath dressing upon patient returning to the room. Completed a full HT assessment on patient. Patient was given morning meds late upon return from HD with applesauce. Elevated patiens HOB. Patients VSS. Will continue to monitor patient per MD orders and unit protocol.     1845 Bladder scanned patient and patient had 663. Straight cathed patient according to orders and removed 720. Post void residual was 12.  Patient as free of pain before and after the procedure.

## 2022-07-22 NOTE — Progress Notes (Signed)
Physical Therapy:  Patient is currently OTF for HD, unavailable for PT session.    Rasul Decola Wynetta Emery PT, DPT

## 2022-07-22 NOTE — Care Coordination-Inpatient (Addendum)
Signed                                 Transition of Care Plan:     RUR:  %  Prior Level of Functioning: independent  Disposition: SNF in [place] with HD set up  If SNF or IPR: Date FOC offered: 06/30/22  Date FOC received: wife agrees with plan - prefers closer to [place]  Accepting facility: pending     Date authorization started with reference number:      Date authorization received and expires:   Follow up appointments: new PCP and Specialist in [place] (ID, nephrology, podiatry)  DME needed:   Transportation at discharge: [place] stretcher transport- CM spoke with   IM/IMM Medicare/Tricare letter given:   Is patient a Veteran and connected with VA?               If yes, was [employer] transfer form completed and VA notified?   Caregiver Contact:   Discharge Caregiver contacted prior to discharge?   Care Conference needed? no  Barriers to discharge: HD setup, PT/OT, insurance authorization, transportation to and from OP HD via stretcher      CM left VM for [facility] Admission at [phone] to inquire if they may be able to accept paitent - awaiting call back from [place].    Received email back from Towanda Octave CM from [place] [facility] - she is going to check her contacts and see if there is a facility and OP HD that can accept patient and let CM know.  Chales Abrahams, MSW

## 2022-07-22 NOTE — Care Coordination-Inpatient (Addendum)
0915 - Updated clinicals faxed to the [facility] spoke with therapy during IDRs who confirmed they have a recent note for their visit with the patient yesterday.  Therapy confirmed that patient is not able to ambulate well at this time and would not be appropriate for wheelchair transport.      Lucille Passy, BSN, RN    Care Management  [phone]

## 2022-07-22 NOTE — Other (Signed)
Pre Dialysis     07/22/22 0740   Vital Signs   BP (!) 148/77   Temp 97.5 F (36.4 C)   Pulse 84   Respirations 18   Pain Assessment   Pain Assessment None - Denies Pain   Treatment   Time On 0740   Time Off 1040   Treatment Goal 500   Observations & Evaluations   Level of Consciousness 0   Heart Rhythm Regular   Respiratory Quality/Effort Unlabored   O2 Device None (Room air)   Bilateral Breath Sounds Clear   Skin Condition/Temp Dry;Warm   Abdomen Inspection Soft   Bowel Sounds (All Quadrants) Present   Edema Generalized   Edema Generalized Trace   Technical Checks   Dialysis Machine No. 04   RO Machine Number [id number]   Dialyzer Lot No. [id number]   Tubing Lot Number [id number]   All Connections Secure Yes   NS Bag Yes   Saline Line Double Clamped Yes   Dialyzer Revaclear 300   Prime Volume (mL) 250 mL   ICEBOAT I;C;E;B;O;A;T   RO Machine Log Sheet Completed Yes   Machine Alarm Self Test Completed;Passed   Materials engineer Function   Extracorporeal Advertising copywriter Conductivity 14   Manual Conductivity 14   Machine Ph 7.4   Bleach Test (Neg) Yes   Bath Temperature 98.6 F (37 C)   Treatment Initiation   Dialyze Hours 3   Treatment  Initiation Universal Precautions maintained;Connections secured;Prime given;Venous Parameters set;Arterial Parameters set;Chiropodist engaged;Saline line double clamped;Revaclear Dialyzer   Dialysis Bath   K+ (Potassium) 3   Ca+ (Calcium) 2.5   Na+ (Sodium) 140   HCO3 (Bicarb) 35   Bicarbonate Concentrate Lot No. [id number]   Acid Concentrate Lot No. [id number]     Primary RN SBAR: Gwenette Greet, RN  Patient Education: CVC infection prevention & control.  Hepatitis B Surface Ag   Date/Time Value Ref Range Status   07/19/2022 06:10 PM <0.10 Index Final     Hep B S Ab   Date/Time Value Ref Range Status   06/29/2022 09:53 AM <3.10 mIU/mL Final     Post Dialysis     07/22/22 1040   Vital Signs   BP 133/73   Temp 97.7 F (36.5 C)   Pulse 86    Respirations 18   Pain Assessment   Pain Assessment None - Denies Pain   Post-Hemodialysis Assessment   Post-Treatment Procedures Blood returned;Catheter capped, clamped and heparinized x 2 ports   Herbalist   Rinseback Volume (ml) 250 ml   Blood Volume Processed (Liters) 67.3 L   Dialyzer Clearance Lightly streaked   Duration of Treatment (minutes) 180 minutes   Hemodialysis Intake (ml) 500 ml   Hemodialysis Output (ml) 1000 ml   NET Removed (ml) 500   Tolerated Treatment Good   Bilateral Breath Sounds Clear   Edema Generalized   Time Off 1040   Patient Disposition Return to room       Primary RN SBAR: Consepcion Hearing, RN  Comments: Pt tolerated treatment with no issues.

## 2022-07-22 NOTE — Progress Notes (Signed)
Hospitalist Progress Note    NAME:   Jaime [name]   DOB: Jul 06, 1946   MRN: [id number]     Date/Time: 07/22/2022 7:57 AM  Patient PCP: No primary care provider on file.    Estimated discharge date:   Barriers: Toe amputation  tomorrow     For reference :   76 y/m with prolonged hospitalization, was admitted to surgical service on 7/31 for perforated hollow viscus underwent emergent laparotomy/ septic shock d/t peritonitis, was intubated and extubated x 2. Also had AKI needing CVVH, now on HD. Also has bilateral foot gangrene. Was transferred to hospitalist service on 8/21.     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 Other option could be to go to an SNF in [place] [facility] ) .     9/13:on Omnicef p.o. due to lack of IV access.   plan to continue Omnicef p.o. x2 more weeks  ending 8 weeks on 9/ 29.  9/15: Patient stable   9/16: Medically stable   Awaiting placement   continue Omnicef p.o.until  on 9/ 29.  9/17  2 episodes of fever 100.4-relieved with Tylenol  No gross symptoms of infection  Examination unremarkable  Patient on on antibiotics-we will monitor for the fever  If persistent high-grade fever will start work-up  ID following  9/18  Remained afebrile overnight  No new leukocyte noted on CBC  Continue with antibiotic as per ID  9/19 and 9/20:  No acute issues, continue with the same management    Antimicrobial orders for  discharge  -Omnicef 300 mg p.o. daily end date 9/29  -- Pt will require repeat imaging of liver before conclusion of therapy  -Weekly CBC, CMP-  -Encourage adequate fluids, daily probiotic/yogurt  -ID follow-up - if pt remains in [place] call [phone]      #Acute hypoxemic respiratory failure:  Intubated 7/31, extubation 8/7  -Reintubated 8/13, extubated on 8/19  -Currently on 2 liters [place]  -Weaned off o2      #Dysphagia:  -Ongoing SLP evaluation-patient tolerating small diet .   -Aspiration on FEES study  -Follow-up SLP evaluation     #Acute kidney injury:  -Now on HD  -Continue with HD  -Nephrology following  -Case management informed the need to set up HD at SNF prior to discharge .            #Diabetes mellitus:  -Sliding scale and lantus  -Monitor FS  -Diabetes team following     #Sacral wound:  -Wound care following    Pending placement        Medical Decision Making:   I personally reviewed labs:   I personally reviewed imaging:  I personally reviewed EKG:  Toxic drug monitoring:   Discussed case with: Patient , RN        Code Status: Full   DVT Prophylaxis: Heparin   GI Prophylaxis:  Subjective:     Chief Complaint / Reason for Physician Visit  "Follow up case. No active complaints. ".  Discussed with RN events overnight.       Objective:     VITALS:   Last 24hrs VS reviewed since prior progress note. Most recent are:  Patient Vitals for the past 24 hrs:   BP Temp Temp src Pulse Resp SpO2 Weight   07/22/22 0630 -- -- -- -- -- -- 91.9 kg (202 lb 9.6 oz)   07/22/22 0326 -- -- -- -- -- 96 % --   07/22/22 0325 -- -- -- -- -- 90 % --   07/22/22 0255 129/70 98.5 F (36.9 C) Oral 92 18 -- --   07/21/22 2250 (!) 115/58 98.5 F (36.9 C) Oral (!) 101 18 95 % --   07/21/22 1954 125/65 98.5 F (36.9 C) Oral (!) 110 20 96 % --   07/21/22 1132 120/62 97.8 F (36.6 C) Oral 91 17 98 % --       No intake or output data in the 24 hours ending 07/22/22 0757         I had a face to face encounter and independently  examined this patient on 07/22/2022, as outlined below:  PHYSICAL EXAM:  General: Alert, cooperative  EENT:  EOMI. Anicteric sclerae..Rt. hd catn - jugular   Resp:  CTA bilaterally, no wheezing or rales.  No accessory muscle use  CV:  Regular  rhythm,  No edema  GI:  Soft, Non distended, Non tender.  +Bowel sounds, wound vac in place , drain on rt upper quadrant   Neurologic:  Alert and oriented X 3, normal speech,   Psych:   Good insight. Not anxious nor agitated  Foot :  bilateral feet - gangrene of most of the toes  , not yet full demarcation     Reviewed most current lab test results and cultures  YES  Reviewed most current radiology test results   YES  Review and summation of old records today    NO  Reviewed patient's current orders and MAR    YES  PMH/SH reviewed - no change compared to H&P  ________________________________________________________________________  Care Plan discussed with:    Comments   Patient x    Family      RN x    Care Manager     Consultant                        Multidiciplinary team rounds were held today with case manager, nursing, pharmacist and Higher education careers adviser.  Patient's plan of care was discussed; medications were reviewed and discharge planning was addressed.     ________________________________________________________________________  Total NON critical care TIME:  25  Minutes    Total CRITICAL CARE TIME Spent:   Minutes non procedure based      Comments   >50% of visit spent in counseling and coordination of care     ________________________________________________________________________  Jerilynn Birkenhead, MD     Procedures: see electronic medical records for all procedures/Xrays and details which were not copied into this note but were reviewed prior to creation of Plan.      LABS:  I reviewed today's most current labs and imaging studies.  Pertinent labs include:  Recent Labs     07/20/22  0801 07/22/22  0734   WBC 9.5 10.9   HGB 8.6* 8.1*   HCT 26.9* 25.7*   PLT 193  187       Recent Labs     07/20/22  0801   NA 135*   K 3.4*   CL 100   CO2 30   GLUCOSE 119*   BUN 28*   CREATININE 4.52*   CALCIUM 8.1*   MG 2.9*   PHOS 3.2         Signed: Tad Moore, MD

## 2022-07-22 NOTE — Progress Notes (Signed)
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    Needs outpatient HD placement under ESKD diagnosis - CM working on options (here or back in [place])    Watch for renal recovery, but none so far.    H/H not at goal.  Continue ESA.                Subjective:     Chief Complaint: Resting. The patient was seen on dialysis at 8:33 AM .  BP is stable. Catheter is functioning well.       Review of Systems:    Symptom Y/N Comments  Symptom Y/N Comments   Fever/Chills    Chest Pain     Poor Appetite    Edema     Cough    Abdominal Pain     Sputum    Joint Pain     SOB/DOE    Pruritis/Rash     Nausea/vomit    Tolerating PT/OT     Diarrhea    Tolerating Diet     Constipation    Other       Could not obtain due to:      Objective:     VITALS:   Last 24hrs VS reviewed since prior progress note. Most recent are:  Vitals:    07/22/22 0830   BP: (!) 144/70   Pulse: 81   Resp:    Temp:    SpO2:      No intake or output data in the 24 hours ending 07/22/22 0832     Telemetry Reviewed:     PHYSICAL EXAM:  General: NAD  trace edema  R TDC intact  B/l toes- dry gangrene      Lab Data Reviewed: (see below)    Medications Reviewed: (see below)    PMH/SH reviewed - no change compared to H&P  ________________________________________________________________________  Care Plan discussed with:  Patient     Family      RN     Care Manager                    Consultant:          Comments   >50% of visit spent in counseling and coordination of care       ________________________________________________________________________  Tanna Savoy, MD     Procedures: see electronic medical records for all procedures/Xrays and details which  were not  copied into this note but were reviewed prior to creation of Plan.      LABS:  Recent Labs     07/20/22  0801 07/22/22  0734   WBC 9.5 10.9   HGB 8.6* 8.1*   HCT 26.9* 25.7*   PLT 193 187       Recent Labs     07/20/22  0801   NA  135*   K 3.4*   CL 100   CO2 30   BUN 28*   MG 2.9*   PHOS 3.2         MEDICATIONS:  Current Facility-Administered Medications   Medication Dose Route Frequency    ALPRAZolam (XANAX) tablet 0.25 mg  0.25 mg Oral BID PRN    oxyCODONE (ROXICODONE) immediate release tablet 5 mg  5 mg Oral Q4H PRN    diphenhydrAMINE (BENADRYL) capsule 25 mg  25 mg Oral Q6H PRN    cefdinir (OMNICEF) capsule 300 mg  300 mg Oral Daily    sevelamer (RENVELA) packet 2.4 g  2.4 g Oral TID WC    heparin (porcine) 1000 UNIT/ML injection 1,800 Units  1,800 Units IntraCATHeter PRN    And    heparin (porcine) 1000 UNIT/ML injection 1,800 Units  1,800 Units IntraCATHeter PRN    guaiFENesin (ROBITUSSIN) 100 MG/5ML liquid 200 mg  200 mg Oral Q4H PRN    heparin (porcine) injection 5,000 Units  5,000 Units SubCUTAneous 3 times per day    therapeutic multivitamin-minerals 1 tablet  1 tablet Per NG tube Daily    amiodarone (CORDARONE) tablet 200 mg  200 mg Per NG tube Daily    acidophilus probiotic capsule 1 capsule  1 capsule Oral Daily    lidocaine 2 % injection 20 mL  20 mL IntraDERmal Once    collagenase ointment   Topical Daily    epoetin alfa-epbx (RETACRIT) injection 6,000 Units  6,000 Units SubCUTAneous Once per day on Mon Wed Fri    fentaNYL (SUBLIMAZE) injection 25 mcg  25 mcg IntraVENous Q1H PRN    midodrine (PROAMATINE) tablet 5 mg  5 mg Oral Daily PRN    insulin lispro (HUMALOG) injection vial 0-4 Units  0-4 Units SubCUTAneous 4 times per day    bisacodyl (DULCOLAX) suppository 10 mg  10 mg Rectal Daily PRN    albumin human 25% IV solution 25 g  25 g IntraVENous PRN    sodium chloride 0.9 % bolus 100 mL  100 mL IntraVENous PRN    balsum peru-castor oil (VENELEX) ointment   Topical BID    glucose chewable tablet 16 g   4 tablet Oral PRN    dextrose bolus 10% 125 mL  125 mL IntraVENous PRN    Or    dextrose bolus 10% 250 mL  250 mL IntraVENous PRN    glucagon (rDNA) injection 1 mg  1 mg SubCUTAneous PRN    dextrose 10 % infusion   IntraVENous Continuous PRN    sodium chloride flush 0.9 % injection 5-40 mL  5-40 mL IntraVENous 2 times per day    sodium chloride flush 0.9 % injection 5-40 mL  5-40 mL IntraVENous PRN    0.9 % sodium chloride infusion   IntraVENous PRN    acetaminophen (TYLENOL) tablet 650 mg  650 mg Oral Q6H PRN    Or    acetaminophen (TYLENOL) suppository 650 mg  650 mg Rectal Q6H PRN    ondansetron (ZOFRAN) injection 4 mg  4 mg IntraVENous Q6H PRN

## 2022-07-23 ENCOUNTER — Inpatient Hospital Stay: Admit: 2022-07-23 | Payer: MEDICARE

## 2022-07-23 LAB — POCT BLOOD GAS & ELECTROLYTES
Anion Gap, POC: 10 (ref 10–20)
Base Excess: 6 mmol/L
HCO3, Arterial: 30 mmol/L
PCO2, Venus, POC: 41.7 MMHG (ref 41–51)
PH, VENOUS (POC): 7.47 — ABNORMAL HIGH (ref 7.32–7.42)
PO2, VENOUS (POC): 35 mmHg (ref 25–40)
POC Chloride: 100 MMOL/L (ref 100–108)
POC Creatinine: 2.7 MG/DL — ABNORMAL HIGH (ref 0.6–1.3)
POC Glucose: 93 MG/DL (ref 74–106)
POC Ionized Calcium: 1.17 mmol/L (ref 1.12–1.32)
POC Lactic Acid: 0.66 mmol/L (ref 0.40–2.00)
POC O2 SAT: 71 %
POC Potassium: 3.6 MMOL/L (ref 3.5–5.5)
POC Sodium: 140 MMOL/L (ref 136–145)
POC TCO2: 30 MMOL/L — ABNORMAL HIGH (ref 19–24)
eGFR, POC: 24 mL/min/{1.73_m2} — ABNORMAL LOW (ref >60)

## 2022-07-23 LAB — POCT GLUCOSE
POC Glucose: 127 mg/dL — ABNORMAL HIGH (ref 65–117)
POC Glucose: 121 mg/dL — ABNORMAL HIGH (ref 65–117)
POC Glucose: 110 mg/dL (ref 65–117)
POC Glucose: 128 mg/dL — ABNORMAL HIGH (ref 65–117)
POC Glucose: 111 mg/dL (ref 65–117)
POC Glucose: 84 mg/dL (ref 65–117)
POC Glucose: 104 mg/dL (ref 65–117)

## 2022-07-23 MED ORDER — LACTATED RINGERS IV SOLN
INTRAVENOUS | Status: DC
Start: 2022-07-23 — End: 2022-07-23

## 2022-07-23 MED ORDER — DROPERIDOL 2.5 MG/ML IJ SOLN
2.5 MG/ML | Freq: Once | INTRAMUSCULAR | Status: DC | PRN
Start: 2022-07-23 — End: 2022-07-23

## 2022-07-23 MED ORDER — MIRTAZAPINE 15 MG PO TABS
15 | Freq: Every evening | ORAL | Status: DC
Start: 2022-07-23 — End: 2022-08-03
  Administered 2022-07-24 – 2022-08-03 (×11): 7.5 mg via ORAL

## 2022-07-23 MED ORDER — FENTANYL CITRATE PF 50 MCG/ML IJ SOSY
50 MCG/ML | INTRAMUSCULAR | Status: DC | PRN
Start: 2022-07-23 — End: 2022-07-23
  Administered 2022-07-23: 20:00:00 25 via INTRAVENOUS

## 2022-07-23 MED ORDER — ONDANSETRON HCL 4 MG/2ML IJ SOLN
4 MG/2ML | Freq: Once | INTRAMUSCULAR | Status: DC | PRN
Start: 2022-07-23 — End: 2022-07-23

## 2022-07-23 MED ORDER — SODIUM CHLORIDE 0.9 % IV SOLN (MINI-BAG)
0.9 % | INTRAVENOUS | Status: AC
Start: 2022-07-23 — End: ?

## 2022-07-23 MED ORDER — SODIUM CHLORIDE 0.9 % IV SOLN
0.9 % | INTRAVENOUS | Status: DC | PRN
Start: 2022-07-23 — End: 2022-07-23

## 2022-07-23 MED ORDER — FENTANYL CITRATE (PF) 100 MCG/2ML IJ SOLN
100 MCG/2ML | INTRAMUSCULAR | Status: AC
Start: 2022-07-23 — End: ?

## 2022-07-23 MED ORDER — SODIUM CHLORIDE 0.9 % IV SOLN
0.9 % | INTRAVENOUS | Status: DC | PRN
Start: 2022-07-23 — End: 2022-07-23
  Administered 2022-07-23: 20:00:00 40 via INTRAVENOUS

## 2022-07-23 MED ORDER — ONDANSETRON HCL 4 MG/2ML IJ SOLN
4 MG/2ML | INTRAMUSCULAR | Status: DC | PRN
Start: 2022-07-23 — End: 2022-07-23
  Administered 2022-07-23: 21:00:00 4 via INTRAVENOUS

## 2022-07-23 MED ORDER — HEPARIN SODIUM (PORCINE) 1000 UNIT/ML IJ SOLN
1000 UNIT/ML | Freq: Once | INTRAMUSCULAR | Status: AC
Start: 2022-07-23 — End: 2022-07-23
  Administered 2022-07-23: 22:00:00 1800 [IU]

## 2022-07-23 MED ORDER — NORMAL SALINE FLUSH 0.9 % IV SOLN
0.9 % | Freq: Two times a day (BID) | INTRAVENOUS | Status: DC
Start: 2022-07-23 — End: 2022-07-23

## 2022-07-23 MED ORDER — HYDROMORPHONE HCL PF 1 MG/ML IJ SOLN
1 MG/ML | INTRAMUSCULAR | Status: DC | PRN
Start: 2022-07-23 — End: 2022-07-23

## 2022-07-23 MED ORDER — MEPERIDINE HCL 25 MG/ML IJ SOLN
25 MG/ML | INTRAMUSCULAR | Status: DC | PRN
Start: 2022-07-23 — End: 2022-07-23

## 2022-07-23 MED ORDER — SUCCINYLCHOLINE CHLORIDE 20 MG/ML IJ SOLN
20 MG/ML | INTRAMUSCULAR | Status: DC | PRN
Start: 2022-07-23 — End: 2022-07-23
  Administered 2022-07-23: 20:00:00 120 via INTRAVENOUS

## 2022-07-23 MED ORDER — NOZIN NASAL SANITIZER 62 % NA KIT
62 % | Freq: Once | NASAL | Status: AC
Start: 2022-07-23 — End: 2022-07-23
  Administered 2022-07-23: 20:00:00 3 via TOPICAL

## 2022-07-23 MED ORDER — LIDOCAINE HCL (PF) 2 % IJ SOLN
2 % | INTRAMUSCULAR | Status: DC | PRN
Start: 2022-07-23 — End: 2022-07-23
  Administered 2022-07-23: 20:00:00 100 via INTRAVENOUS

## 2022-07-23 MED ORDER — NORMAL SALINE FLUSH 0.9 % IV SOLN
0.9 % | INTRAVENOUS | Status: DC | PRN
Start: 2022-07-23 — End: 2022-07-23

## 2022-07-23 MED ORDER — CEFTRIAXONE SODIUM 1 G IJ SOLR
1 g | INTRAMUSCULAR | Status: AC
Start: 2022-07-23 — End: ?

## 2022-07-23 MED ORDER — PROPOFOL 100 MG/10ML IV EMUL
100 MG/10ML | INTRAVENOUS | Status: DC | PRN
Start: 2022-07-23 — End: 2022-07-23
  Administered 2022-07-23: 20:00:00 130 via INTRAVENOUS

## 2022-07-23 MED ORDER — SODIUM CHLORIDE 0.9 % IV SOLN
0.9 % | Freq: Once | INTRAVENOUS | Status: DC
Start: 2022-07-23 — End: 2022-07-23

## 2022-07-23 MED ORDER — LIDOCAINE HCL (PF) 1 % IJ SOLN
1 % | Freq: Once | INTRAMUSCULAR | Status: DC | PRN
Start: 2022-07-23 — End: 2022-07-23

## 2022-07-23 MED ORDER — LIDOCAINE HCL 2 % IJ SOLN
2 % | INTRAMUSCULAR | Status: AC
Start: 2022-07-23 — End: ?

## 2022-07-23 MED ORDER — DEXAMETHASONE 4 MG/ML IJ SOLN (MIXTURES ONLY)
4 MG/ML | INTRAMUSCULAR | Status: DC | PRN
Start: 2022-07-23 — End: 2022-07-23
  Administered 2022-07-23: 20:00:00 8 via INTRAVENOUS

## 2022-07-23 MED ORDER — PHENYLEPHRINE HCL (PRESSORS) 0.4 MG/10ML IV SOSY
0.4 MG/10ML | INTRAVENOUS | Status: DC | PRN
Start: 2022-07-23 — End: 2022-07-23
  Administered 2022-07-23 (×4): 120 via INTRAVENOUS

## 2022-07-23 MED ORDER — SODIUM CHLORIDE 0.9 % IV SOLN (MINI-BAG)
0.9 | INTRAVENOUS | Status: AC
Start: 2022-07-23 — End: 2022-07-31
  Administered 2022-07-23 – 2022-07-31 (×9): 2000 mg via INTRAVENOUS

## 2022-07-23 MED ORDER — SODIUM CHLORIDE 0.9 % IV SOLN
0.9 % | INTRAVENOUS | Status: DC | PRN
Start: 2022-07-23 — End: 2022-07-23
  Administered 2022-07-23: 20:00:00 via INTRAVENOUS

## 2022-07-23 MED ORDER — FENTANYL CITRATE (PF) 100 MCG/2ML IJ SOLN
100 MCG/2ML | INTRAMUSCULAR | Status: DC | PRN
Start: 2022-07-23 — End: 2022-07-23

## 2022-07-23 MED FILL — SODIUM CHLORIDE 0.9 % IV SOLN: 0.9 % | INTRAVENOUS | Qty: 100

## 2022-07-23 MED FILL — RENVELA 2.4 G PO PACK: 2.4 g | ORAL | Qty: 2.4

## 2022-07-23 MED FILL — HEPARIN SODIUM (PORCINE) 1000 UNIT/ML IJ SOLN: 1000 UNIT/ML | INTRAMUSCULAR | Qty: 1.8

## 2022-07-23 MED FILL — FENTANYL CITRATE (PF) 100 MCG/2ML IJ SOLN: 100 MCG/2ML | INTRAMUSCULAR | Qty: 2

## 2022-07-23 MED FILL — CEFTRIAXONE SODIUM 1 G IJ SOLR: 1 g | INTRAMUSCULAR | Qty: 2000

## 2022-07-23 MED FILL — LIDOCAINE HCL 2 % IJ SOLN: 2 % | INTRAMUSCULAR | Qty: 40

## 2022-07-23 MED FILL — SODIUM CHLORIDE 0.9 % IV SOLN: 0.9 % | INTRAVENOUS | Qty: 50

## 2022-07-23 NOTE — Anesthesia Procedure Notes (Signed)
Central Venous Line:    A central venous line was placed using surface landmarks, in the OR for the following indication(s): central venous access and CVP monitoring.07/23/2022 4:12 PM9/21/2023 4:26 PM    Sterility preparation included the following: hand hygiene performed prior to procedure, maximum sterile barriers used and sterile technique used to drape from head to toe.    The patient was placed in Trendelenburg position.The left internal jugular vein was prepped.    The site was prepped with Chloraprep.  A 7 Fr (size), 16 (length), introducer triple lumen was placed.    During the procedure, the following specific steps were taken: target vein identified, needle advanced into vein and blood aspirated and guidewire advanced into vein.    Intravenous verification was obtained by ultrasound, venous blood return and x-ray.    Post insertion care included: all ports aspirated, all ports flushed easily, guidewire removed intact, Biopatch applied, line sutured in place and dressing applied.    During the procedure the patient experienced: patient tolerated procedure well with no complications and EBL < 2mL.      Outcomes: uncomplicated and patient tolerated procedure well  Real-time [name] image taken/store: yes  Anesthesia type: local..No  Staffing  Performed: Anesthesiologist   Anesthesiologist: Joyice Faster, MD  Performed by: Joyice Faster, MD  Authorized by: Joyice Faster, MD    Preanesthetic Checklist  Completed: patient identified, IV checked, site marked, risks and benefits discussed, surgical/procedural consents, equipment checked, pre-op evaluation, timeout performed, anesthesia consent given, oxygen available, monitors applied/VS acknowledged, fire risk safety assessment completed and verbalized and blood product R/B/A discussed and consented

## 2022-07-23 NOTE — Anesthesia Post-Procedure Evaluation (Signed)
Department of Anesthesiology  Postprocedure Note    Patient: Jaime [name]  MRN: [id number]  Birthdate: 05-27-1946  Date of evaluation: 07/23/2022      Procedure Summary     Date: 07/23/22 Room / Location: [facility] / [facility]    Anesthesia Start: 1553 Anesthesia Stop: 1856    Procedure: BILATERAL TRANSMETATARSAL AMPUTATION (Bilateral: Foot) Diagnosis:       Gangrene ([place])      (Gangrene ([facility]) [I96])    Providers: Salomon Fick, DPM Responsible Provider: Joyice Faster, MD    Anesthesia Type: General ASA Status: 3          Anesthesia Type: General    Aldrete Phase I: Aldrete Score: 10    Aldrete Phase II:        Anesthesia Post Evaluation    Patient location during evaluation: bedside  Patient participation: complete - patient participated  Level of consciousness: awake and alert  Airway patency: patent  Nausea & Vomiting: no nausea and no vomiting  Complications: no  Cardiovascular status: hemodynamically stable  Respiratory status: acceptable  Hydration status: stable  Multimodal analgesia pain management approach  Pain management: adequate

## 2022-07-23 NOTE — Progress Notes (Addendum)
Hospitalist Progress Note    NAME:   Jaime [name]   DOB: Dec 06, 1945   MRN: [id number]     Date/Time: 07/24/2022 1:44 PM  Patient PCP: No primary care provider on file.    Estimated discharge date:   Barriers: Toe amputation  tomorrow     For reference :   76 y/m with prolonged hospitalization, was admitted to surgical service on 7/31 for perforated hollow viscus underwent emergent laparotomy/ septic shock d/t peritonitis, was intubated and extubated x 2. Also had AKI needing CVVH, now on HD. Also has bilateral foot gangrene. Was transferred to hospitalist service on 8/21.     Assessment / Plan:  #Bilateral lower extremity critical limb ischemia  Gangrene both feet  -Podiatry and vascular surgery following  Cleared by podiatry for demarcation+ autoamputation  9/19: Appreciated wound care recommendation  Podiatry evaluation today  9/20: Scheduled for toe amputation by podiatry tomorrow   09/21 plan for amputation today , on antibiotics rocephin         #Septic shock- resolved   D/t perforated viscus  S/p Laparotomy 7/31, with wound vac drainage   Liver abscess s/p IR drainage, hepatic drain to be removed  Bacteremia     -Discussed with case management regarding discharge disposition - wife needs to set up financial assistance for transfer of patient to [place] . Other option could be to go to an SNF in [place] [facility] ) .     9/13:on Omnicef p.o. due to lack of IV access.   plan to continue Omnicef p.o. x2 more weeks  ending 8 weeks on 9/ 29.  9/15: Patient stable   9/16: Medically stable   Awaiting placement   continue Omnicef p.o.until  on 9/ 29.  9/17  2 episodes of fever 100.4-relieved with Tylenol  No gross symptoms of infection  Examination unremarkable  Patient on on antibiotics-we will monitor for the fever  If persistent high-grade fever will start work-up  ID following  9/18  Remained afebrile overnight  No new leukocyte noted on CBC  Continue with antibiotic as per ID  9/19 and 9/20:  No acute issues,  continue with the same management    09/21 started on rocephin    #Acute hypoxemic respiratory failure:  Intubated 7/31, extubation 8/7  -Reintubated 8/13, extubated on 8/19  -Currently on 2 liters NC  -Weaned off o2      #Dysphagia:  -Ongoing SLP evaluation-patient tolerating small diet .   -Aspiration on FEES study  -Follow-up SLP evaluation     #Acute kidney injury:  -Now on HD  -Continue with HD  -Nephrology following  -Case management informed the need to set up HD at SNF prior to discharge .            #Diabetes mellitus:  -Sliding scale and lantus  -Monitor FS  -Diabetes team following     #Sacral wound:  -Wound care following    Pending placement        Medical Decision Making:   I personally reviewed labs:   I personally reviewed imaging:  I personally reviewed EKG:  Toxic drug monitoring:   Discussed case with: Patient , RN        Code Status: Full   DVT Prophylaxis: Heparin   GI Prophylaxis:    Subjective:     Chief Complaint / Reason for Physician Visit  "Follow up case. No active complaints. ".  Discussed with RN events overnight.       Objective:  VITALS:   Last 24hrs VS reviewed since prior progress note. Most recent are:  Patient Vitals for the past 24 hrs:   BP Temp Temp src Pulse Resp SpO2   07/24/22 1302 (!) 158/72 -- -- 87 -- --   07/24/22 1130 138/70 97.4 F (36.3 C) -- 84 16 --   07/24/22 1116 (!) 148/80 -- -- 83 -- --   07/24/22 1100 (!) 147/81 -- -- 83 -- --   07/24/22 1045 137/73 -- -- 81 -- --   07/24/22 1030 137/71 -- -- 80 -- --   07/24/22 1015 125/69 -- -- 81 -- --   07/24/22 1000 (!) 144/73 -- -- 78 -- --   07/24/22 0945 126/67 -- -- 80 -- --   07/24/22 0930 (!) 110/58 -- -- 83 -- --   07/24/22 0915 119/68 -- -- 81 -- --   07/24/22 0900 125/67 -- -- 82 -- --   07/24/22 0845 129/66 97.5 F (36.4 C) -- 82 16 --   07/24/22 0830 135/71 -- -- 77 -- --   07/24/22 0816 (!) 146/75 -- -- 78 -- --   07/24/22 0800 (!) 141/66 97.5 F (36.4 C) -- 81 16 --   07/24/22 0429 135/66 -- -- 82 -- --    07/24/22 0426 -- 98.1 F (36.7 C) Oral -- 18 --   07/24/22 0001 118/69 97.5 F (36.4 C) Oral 97 18 --   07/23/22 1959 123/68 98 F (36.7 C) Oral 93 18 97 %   07/23/22 1745 (!) 140/60 98.2 F (36.8 C) Oral 82 21 99 %   07/23/22 1730 135/64 -- -- 84 19 100 %   07/23/22 1715 126/66 -- -- 80 15 98 %   07/23/22 1710 130/63 -- -- 83 21 95 %   07/23/22 1705 132/60 -- -- 79 15 99 %   07/23/22 1700 123/61 -- -- 81 25 98 %   07/23/22 1655 108/68 -- -- 75 13 100 %   07/23/22 1652 (!) 137/58 97.8 F (36.6 C) Oral 76 22 100 %   07/23/22 1650 (!) 137/58 -- -- -- -- --   07/23/22 1524 (!) 152/63 98.1 F (36.7 C) Oral 89 19 99 %           Intake/Output Summary (Last 24 hours) at 07/24/2022 1344  Last data filed at 07/24/2022 1130  Gross per 24 hour   Intake 750 ml   Output 1010 ml   Net -260 ml            I had a face to face encounter and independently examined this patient on 07/24/2022, as outlined below:  PHYSICAL EXAM:  General: Alert, cooperative  EENT:  EOMI. Anicteric sclerae..Rt. hd catn - jugular   Resp:  CTA bilaterally, no wheezing or rales.  No accessory muscle use  CV:  Regular  rhythm,  No edema  GI:  Soft, Non distended, Non tender.  +Bowel sounds, wound vac in place , drain on rt upper quadrant   Neurologic:  Alert and oriented X 3, normal speech,   Psych:   Good insight. Not anxious nor agitated  Foot :  bilateral feet - gangrene of most of the toes  , not yet full demarcation     Reviewed most current lab test results and cultures  YES  Reviewed most current radiology test results   YES  Review and summation of old records today    NO  Reviewed patient's current orders and [facility]  YES  PMH/SH reviewed - no change compared to H&P  ________________________________________________________________________  Care Plan discussed with:    Comments   Patient x    Family      RN x    Care Manager     Consultant                        Multidiciplinary team rounds were held today with case manager, nursing, pharmacist  and clinical coordinator.  Patient's plan of care was discussed; medications were reviewed and discharge planning was addressed.     ________________________________________________________________________  Total NON critical care TIME:  25  Minutes    Total CRITICAL CARE TIME Spent:   Minutes non procedure based      Comments   >50% of visit spent in counseling and coordination of care     ________________________________________________________________________  Terence Lux, MD     Procedures: see electronic medical records for all procedures/Xrays and details which were not copied into this note but were reviewed prior to creation of Plan.      LABS:  I reviewed today's most current labs and imaging studies.  Pertinent labs include:  Recent Labs     07/22/22  0734 07/24/22  0049   WBC 10.9 12.1*   HGB 8.1* 8.4*   HCT 25.7* 27.0*   PLT 187 206         Recent Labs     07/22/22  0734 07/23/22  1536 07/24/22  0049   NA 134*  --  135*   K 3.5  --  4.6   CL 99  --  102   CO2 31  --  29   GLUCOSE 127*  --  191*   BUN 27*  --  26*   CREATININE 3.88* 2.7* 3.12*   CALCIUM 8.4*  --  8.3*   PHOS 2.9  --   --    LABALBU 1.9*  --   --            Signed: Terence Lux, MD

## 2022-07-23 NOTE — Progress Notes (Signed)
Infectious Disease Progress    Impression      Hepatic abscess  Gas and fluid containing collection 7.1 x 10.8 x 5.4 cm  In anterior left lobe of liver, patchy areas of hypodensity right lobe  4 x 4.5 x 5.5 cm collection  S/p drainage by IR 8/4.Cultures + for  few E.coli  CT abdomen/pelvis 9/12+ for collection 5.8 x 3.3 cm  Previously 5 x 3 cm.  Peripancreatic fatty infiltration & stranding again noted.  Patient changed to [facility] 9/12 2ry to lack of IV access  Patient doing well on  Omnicef, no adverse side effects.  Purulent drainage noted in bag    Sepsis  Septic shock.  Resolved    E. coli bacteremia  Blood cultures 7/31+ for E. coli   2/2 LAC (pan sensitive)   Negative repeat cultures 8/4   Treated.    Acute abdomen  Pneumoperitoneum  S/p diagnostic laparoscopy, laparotomy  EGD 7/31  Findings of cloudy fluid in right upper quadrant, yellow/white  Inflammatory peel over lesser curve of stomach, inferior lobe of liver  No gross perforation identified  Pancreas and retroperitoneum appeared edematous  Intra-Op fluid culture + for E. Coli (pansensitive)      S/p abdominal wound dehiscence  Clean, wound is healing  Cultures 8/17, 8/18-NG.    Leukocytosis   Now resolved wbc 10.9      Gangrene, discoloration of toes  Persist  For amputation today      Acute hypoxic respiratory failure  Re intubated 8/13, extubated 8/19  Initially intubated 7/31, s/p extubation 8/7  CXR  8/20 + + for atelectasis  Resp cultures  8/13+ for light yeast  Apparent C.albicans/ dubliensis        AKI  Cr 4.86 on HD    Coagulopathy  Improving    Thrombocytopenia  resolved      Transaminitis  Improving    Hyperbilirubinemia   Improved      Diabetes type 2  Hyperglycemia  A1c 7.7    Diarrhea  resolved    Obesity  BMI 34.76  Plan  Pt was on Ceftriaxone 2 g IV daily   Antibiotic coverage extended to 8 weeks end date   9/29 given persistence of abscess on repeat CT.  Currently on Omnicef p.o. due to lack of IV access.  Will resume Ceftriaxone IV  given he is going for toe amputation  Empty drain q shift please  Pt will require repeat imaging of liver before conclusion of therapy  D/w patient.            Extensive review of chart notes, labs, imaging, cultures done  Additionally review of done: Drain output      Patient seen  today.  Awake, denies new complaints  Greenish drain output noted in bag.      History reviewed. No pertinent past medical history.    Past Surgical History:   Procedure Laterality Date    BLADDER SURGERY N/A 06/01/2022    ENDOSCOPY OF ILEAL CONDUIT performed by Guinevere Ferrari, MD at [facility] OR    CT VISCERAL PERCUTANEOUS DRAIN  06/05/2022    CT VISCERAL PERCUTANEOUS DRAIN 06/05/2022 [facility] RAD [facility]    IR TUNNELED CATHETER PLACEMENT GREATER THAN 5 YEARS  07/01/2022    IR TUNNELED CATHETER PLACEMENT GREATER THAN 5 YEARS 07/01/2022 [facility] Jeff, APRN - NP [facility] ANGIO IR    LAPAROSCOPY N/A 06/01/2022    LAPAROSCOPY DIAGNOSTIC performed by Guinevere Ferrari, MD at [facility]    LAPAROTOMY N/A 06/01/2022    LAPAROTOMY EXPLORATORY performed by Melene Plan, MD at [facility]       Allergies   Allergen Reactions    Augmentin [Amoxicillin-Pot Clavulanate] Hives     Tolerated ceftriaxone 06/2022    Codeine      Unknown reaction    Oxycodone Itching       Social Connections: Not on file       No family status information on file.           Review of Systems - Negative except those mentioned in H&P      PHYSICAL EXAM:  General:          Awake, in no distress  EENT:              EOMI. Anicteric sclerae. MMM  Resp:               CTA bilaterally, no wheezing or rales.  No accessory muscle use  CV:                  Regular  rhythm,  No edema  GI:                   Soft, Non distended, Non tender.  +Bowel sounds  Neurologic:      Alert and oriented X 3, normal speech,   Psych:             Good insight. Not anxious nor agitated  Skin:                No rashes.  No jaundice.  Extremities :  No edema, discoloration of  toes++.   Drain+ greenish drainage.    Lurena Nida, MD Rosalita Chessman

## 2022-07-23 NOTE — Care Coordination-Inpatient (Signed)
Transition of Care Plan:     RUR:  %  Prior Level of Functioning: independent  Disposition: SNF in [place] with HD set up  If SNF or IPR: Date FOC offered: 06/30/22  Date FOC received: wife agrees with plan - prefers closer to [place]  Accepting facility: pending     Date authorization started with reference number:      Date authorization received and expires:   Follow up appointments: new PCP and Specialist in [place] (ID, nephrology, podiatry)  DME needed:   Transportation at discharge: [facility] stretcher transport- CM spoke with   IM/IMM Medicare/Tricare letter given:   Is patient a Veteran and connected with VA?               If yes, was [employer] transfer form completed and VA notified?   Caregiver Contact:   Discharge Caregiver contacted prior to discharge?   Care Conference needed? no  Barriers to discharge: , surgery on 07/23/22 not medically stable, HD setup, PT/OT, insurance authorization, transportation to and from OP HD via stretcher       CM attempted to call Susie wife - left VM - no response from [facility] [phone] - will ask wife to call to see if she is able to get a response.      CM has reached out to [place] with [facility] - she is seeking out for OP HD placements in the [place] and surrounding areas as well.    Patient is now ESRD - if unable to secure a facility in [place] may have to seek placement in [place] has in house OP  HD - will need to talk to patient/wife to ensure they are in agreement with plan.  Patient is having toe amputation today. Chales Abrahams, MSW

## 2022-07-23 NOTE — Progress Notes (Addendum)
PT Note    Chart reviewed.  Attempted to see pt for PT session, however pt politely refused stating he is leaving the floor for B transmet amputation in about an hour.  Will need a weight bearing status to proceed with PT after procedure(post op day 1).  Will defer and continue to follow.

## 2022-07-23 NOTE — Progress Notes (Signed)
Pt refusing IV placement by nursing staff.

## 2022-07-23 NOTE — Op Note (Signed)
[facility]    Operative Note      Patient: Jaime [name]  Date of Birth: 09/05/1946  MRN: [id number]    Date of Procedure:   07/30/22    Pre-Op Diagnosis Codes:  Gangrene, right forefoot  Gangrene, left forefoot    Post-Op Diagnosis:   Same       Procedure(s):  TRANSMETATARSAL AMPUTATION, RIGHT FOOT  TRANSMETATARSAL AMPUTATION, LEFT FOOT    Surgeon(s):  Salomon Fick, DPM    Assistant:   None    Anesthesia:   General    Estimated Blood Loss (mL):   474QV    Complications:   None    Specimens:   ID Type Source Tests Collected by Time Destination   A : Right transmetatarsal Tissue Foot SURGICAL PATHOLOGY Salomon Fick, DPM 07/30/2022 1616    B : Left transmetatarsal Tissue Foot SURGICAL PATHOLOGY Salomon Fick, DPM Jul 30, 2022 1620      Implants:  None      Drains:   None    Findings:   Gangrene to the bilateral forefeet. Adequate bleeding throughout the procedure    Detailed Description of Procedure:   Pt was seen in the pre-operative holding area and all questions were answered and all concerns were addressed. The operative procedures were discussed in great detail, with all possible complications highlighted. Pt verbalized complete understanding and the consent was signed and witnessed. Pt was on scheduled abx, thus no additional abx ordered for surgical prophylaxis. The operative limbs were marked and the pt was transported to the operating room. The pt was transferred to the operating table and anesthesia was administered as indicated above. The bilateral lower extremities were scrubbed and draped in sterile fashion. A time out was performed to confirm the correct pt, correct procedure, correct limb, abx, allergies, fire risk and attendees within the operating room. Upon completion, procedure #1 commenced.    Procedure #1: TRANSMETATARSAL AMPUTATION, RIGHT FOOT  A surgical marker was used to outline the planned surgical incision to the right foot,  trying to preserve as much viable tissue for primary closure. A #15 blade was taken directly to bone and all soft tissue was removed from the metatarsal head and necks. A key elevator was used to detach and remaining attachments noted to the met head and necks. A hand held bone saw was used to remove the mets as proximal as possible. Adequate bone resection was noted for primary closure without the need for a rotational flap. Remaining tissue was noted to be granular with adequate bleeding noted for wound healing. Absorbable 2.0 monocryl was used to close dead space created and to achieve soft tissue coverage of the bone. The same suture was used in the subq layer to close the deeper layers and remove tension was the skin edge. At this time, surgical staples used to reapproximate the skin edge. Minimal tension was noted and complete primary closure of the surgical site was achieved. A wet then dry wash was performed and xeroform was applied to the incision covered with sterile 4x4s, abd pads, kerlex and ace wrap.     Procedure #2: TRANSMETATARSAL AMPUTATION, LEFT FOOT  Attention was then turned to the left foot. A surgical marker was used to outline the planned surgical incision to the left foot, trying to preserve as much viable tissue for primary closure. A #15 blade was taken directly to bone and all soft tissue was removed from the metatarsal head and necks. A  key elevator was used to detach and remaining attachments noted to the met head and necks. A hand held bone saw was used to remove the mets as proximal as possible. Adequate bone resection was noted for primary closure without the need for a rotational flap. Remaining tissue was noted to be granular with adequate bleeding noted for wound healing. Absorbable 2.0 monocryl was used to close dead space created and to achieve soft tissue coverage of the bone. The same suture was used in the subq layer to close the deeper layers and remove tension was the skin  edge. At this time, surgical staples used to reapproximate the skin edge. Minimal tension was noted and complete primary closure of the surgical site was achieved. A wet then dry wash was performed and xeroform was applied to the incision covered with sterile 4x4s, abd pads, kerlex and ace wrap.     Pt tolerated the above procedures well and all post operative counts were correct. Pt was transferred to PACU without incident. A thorough neurovascular check was then performed. Upon meeting transfer criteria, pt was transferred back to the medical floor.        [name] A. [name], DPM, CWSP, [facility]  [address] [place] [address]  O: [phone]  F: [phone]    [facility] (Opening Sept 2023)  [address] [place] [address]  O: [phone]  F: [phone]    [facility]  [address], [place] [address]  O: [phone]  F: [phone]    * Available via [facility] 24/7        Electronically signed by Salomon Fick, DPM on 07/23/2022 at 4:44 PM

## 2022-07-23 NOTE — Other (Addendum)
07/23/22 1655   Handoff   Communication Given Transfer Handoff   Handoff phase Phase I receiving   Handoff Given To Lilia Pro RN   Handoff Received From E. Shults RN and Jorene Minors CRNA   Handoff Communication Face to Face;At bedside   Time Handoff Given 6269     4854 Verbal sign out from Dr. Posey Pronto.  TRANSFER - OUT REPORT:    Verbal report given to [facility]U RN on Jaime [name]  being transferred to [facility]  for routine progression of patient care       Report consisted of patient's Situation, Background, Assessment and   Recommendations(SBAR).     Information from the following report(s) Surgery Report was reviewed with the receiving nurse.      V    Lines:   CVC Triple Lumen 07/23/22 (Active)   $ Central line insertion $ Yes 07/23/22 1730   Central Line Being Utilized Yes 07/23/22 1730   Criteria for Appropriate Use Limited/no vessel suitable for conventional peripheral access 07/23/22 1730   Site Assessment Clean, dry & intact 07/23/22 1730   Phlebitis Assessment No symptoms 07/23/22 1730   Infiltration Assessment 0 07/23/22 1730   Color/Movement/Sensation Capillary refill less than 3 sec 07/23/22 1730   Proximal Lumen Color/Status Brown 07/23/22 1730   Medial Lumen Status Blue 07/23/22 1730   Distal Lumen Color/Status Red 07/23/22 1730   Line Care Connections checked and tightened;Line pulled back 07/23/22 1730   Alcohol Cap Used Yes 07/23/22 1730   Date of Last Dressing Change 07/23/22 07/23/22 1730   Dressing Type Transparent 07/23/22 1730   Dressing Status Clean, dry & intact 07/23/22 1730       Tunneled Hemodialysis Catheter Right Subclavian (Active)   $Tunneled Hemodialysis Catheter $Yes 07/22/22 1222   Access Status  Accessed 07/23/22 1730   Continued need for line? Yes 07/23/22 1730   Site Assessment Clean, dry & intact 07/23/22 1730   CVC Lumen Status Infusing 07/23/22 1730   Venous Lumen Status Capped 07/23/22 1730   Arterial Lumen Status Capped 07/23/22 1730   Alcohol Cap Used Yes 07/23/22 [place] changed 07/23/22 1730   Dressing Type Bacteriocidal 07/23/22 1730   Date of Last Dressing Change 07/22/22 07/23/22 1730   Dressing Status Clean, dry & intact 07/23/22 1730   Dressing Intervention New 07/22/22 1238   Dressing Change Due 07/28/22 07/22/22 1238       Introducer 07/23/22 (Active)        Opportunity for questions and clarification was provided.      Patient transported with:  Monitor and Registered Nurse

## 2022-07-23 NOTE — Progress Notes (Signed)
Occupational Therapy    Patient going to OR for bilateral trans met amputations this afternoon and politely declined participation in therapy prior to procedure. Will defer OT and follow up for OT reevaluation tomorrow, POD 1. We will need an updated WB status in order to resume therapy post op.     Jaime [name], OTR/L

## 2022-07-23 NOTE — Progress Notes (Signed)
NAME: Jaime [name]        DOB:  Dec 16, 1945        MRN:  [id number]                     Assessment   :                                               Plan:  AKI  --> now ESKD   Pancreatitis  Hyponatremia  DM  Anemia  Toe ischemia KRT initiated 7/31; now on MWF schedule; perm cath 8/30.    Needs outpatient HD placement under ESKD diagnosis - CM working on options (here or back in [place])    Watch for renal recovery, but none so far.    H/H not at goal.  Continue ESA.    For bilateral tma                Subjective:     Chief Complaint: Resting.  No complaint.     Review of Systems:    Symptom Y/N Comments  Symptom Y/N Comments   Fever/Chills    Chest Pain     Poor Appetite    Edema     Cough    Abdominal Pain     Sputum    Joint Pain     SOB/DOE    Pruritis/Rash     Nausea/vomit    Tolerating PT/OT     Diarrhea    Tolerating Diet     Constipation    Other       Could not obtain due to:      Objective:     VITALS:   Last 24hrs VS reviewed since prior progress note. Most recent are:  Vitals:    07/23/22 0812   BP: 139/76   Pulse: 86   Resp:    Temp: 97.7 F (36.5 C)   SpO2: 97%       Intake/Output Summary (Last 24 hours) at 07/23/2022 4076  Last data filed at 07/22/2022 1840  Gross per 24 hour   Intake 510 ml   Output 1782 ml   Net -1272 ml        Telemetry Reviewed:     PHYSICAL EXAM:  General: NAD  trace edema  R TDC intact  B/l toes- dry gangrene      Lab Data Reviewed: (see below)    Medications Reviewed: (see below)    PMH/SH reviewed - no change compared to H&P  ________________________________________________________________________  Care Plan discussed with:  Patient     Family      RN     Care Manager                    Consultant:          Comments   >50% of visit spent in counseling and coordination of care       ________________________________________________________________________  Tanna Savoy, MD     Procedures: see electronic medical  records for all procedures/Xrays and details which  were not copied into this note but were reviewed prior to creation of Plan.      LABS:  Recent Labs     07/22/22  0734   WBC 10.9   HGB 8.1*   HCT 25.7*   PLT 187  Recent Labs     07/22/22  0734   NA 134*   K 3.5   CL 99   CO2 31   BUN 27*   PHOS 2.9         MEDICATIONS:  Current Facility-Administered Medications   Medication Dose Route Frequency    ALPRAZolam (XANAX) tablet 0.25 mg  0.25 mg Oral BID PRN    oxyCODONE (ROXICODONE) immediate release tablet 5 mg  5 mg Oral Q4H PRN    diphenhydrAMINE (BENADRYL) capsule 25 mg  25 mg Oral Q6H PRN    cefdinir (OMNICEF) capsule 300 mg  300 mg Oral Daily    sevelamer (RENVELA) packet 2.4 g  2.4 g Oral TID WC    heparin (porcine) 1000 UNIT/ML injection 1,800 Units  1,800 Units IntraCATHeter PRN    And    heparin (porcine) 1000 UNIT/ML injection 1,800 Units  1,800 Units IntraCATHeter PRN    guaiFENesin (ROBITUSSIN) 100 MG/5ML liquid 200 mg  200 mg Oral Q4H PRN    heparin (porcine) injection 5,000 Units  5,000 Units SubCUTAneous 3 times per day    therapeutic multivitamin-minerals 1 tablet  1 tablet Per NG tube Daily    amiodarone (CORDARONE) tablet 200 mg  200 mg Per NG tube Daily    acidophilus probiotic capsule 1 capsule  1 capsule Oral Daily    lidocaine 2 % injection 20 mL  20 mL IntraDERmal Once    collagenase ointment   Topical Daily    epoetin alfa-epbx (RETACRIT) injection 6,000 Units  6,000 Units SubCUTAneous Once per day on Mon Wed Fri    fentaNYL (SUBLIMAZE) injection 25 mcg  25 mcg IntraVENous Q1H PRN    midodrine (PROAMATINE) tablet 5 mg  5 mg Oral Daily PRN    insulin lispro (HUMALOG) injection vial 0-4 Units  0-4 Units SubCUTAneous 4 times per day    bisacodyl (DULCOLAX) suppository 10 mg  10 mg Rectal Daily PRN    albumin human 25% IV solution 25 g  25 g IntraVENous PRN    sodium chloride 0.9 % bolus 100 mL  100 mL IntraVENous PRN    balsum peru-castor oil (VENELEX) ointment   Topical BID    glucose  chewable tablet 16 g  4 tablet Oral PRN    dextrose bolus 10% 125 mL  125 mL IntraVENous PRN    Or    dextrose bolus 10% 250 mL  250 mL IntraVENous PRN    glucagon (rDNA) injection 1 mg  1 mg SubCUTAneous PRN    dextrose 10 % infusion   IntraVENous Continuous PRN    sodium chloride flush 0.9 % injection 5-40 mL  5-40 mL IntraVENous 2 times per day    sodium chloride flush 0.9 % injection 5-40 mL  5-40 mL IntraVENous PRN    0.9 % sodium chloride infusion   IntraVENous PRN    acetaminophen (TYLENOL) tablet 650 mg  650 mg Oral Q6H PRN    Or    acetaminophen (TYLENOL) suppository 650 mg  650 mg Rectal Q6H PRN    ondansetron (ZOFRAN) injection 4 mg  4 mg IntraVENous Q6H PRN

## 2022-07-23 NOTE — OR Nursing (Signed)
Irrisept Wound Debridement and Cleansing System  Ref: [facility]: [id number] LOT: [id number] Expiration Date: 2025/04/01

## 2022-07-23 NOTE — Anesthesia Pre-Procedure Evaluation (Signed)
Department of Anesthesiology  Preprocedure Note       Name:  Jaime [name]   Age:  76 y.o.  DOB:  1946/01/02                                          MRN:  [id number]         Date:  07/23/2022      Surgeon: Juliann Mule):  Salomon Fick, DPM    Procedure: Procedure(s):  BILATERAL TMA    Medications prior to admission:   Prior to Admission medications    Medication Sig Start Date End Date Taking? Authorizing Provider   metFORMIN (GLUCOPHAGE) 500 MG tablet Take 1 tablet by mouth daily  Patient not taking: Reported on 06/08/2022   Yes Historical Provider, MD   metoprolol (LOPRESSOR) 100 MG tablet Take 1 tablet by mouth daily   Yes Historical Provider, MD   hydrALAZINE (APRESOLINE) 25 MG tablet Take 1 tablet by mouth 3 times daily  Patient not taking: Reported on 06/08/2022   Yes Historical Provider, MD   atorvastatin (LIPITOR) 40 MG tablet Take 1 tablet by mouth daily  Patient not taking: Reported on 06/08/2022   Yes Historical Provider, MD   losartan (COZAAR) 100 MG tablet Take 1 tablet by mouth daily  Patient not taking: Reported on 06/08/2022   Yes Historical Provider, MD   metFORMIN (GLUCOPHAGE-XR) 500 MG extended release tablet Take 1 tablet by mouth daily (with breakfast)  Patient not taking: Reported on 06/08/2022 03/20/22   Historical Provider, MD   hydroCHLOROthiazide (HYDRODIURIL) 25 MG tablet TAKE 1 TABLET BY MOUTH ONCE DAILY AS NEEDED  Patient not taking: Reported on 06/08/2022 03/17/22   Historical Provider, MD   metoprolol succinate (TOPROL XL) 100 MG extended release tablet TAKE 1 TABLET BY MOUTH ONCE DAILY WITH OR IMMEDIATELY FOLLOWING A MEAL 03/10/22   Historical Provider, MD       Current medications:    Current Facility-Administered Medications   Medication Dose Route Frequency Provider Last Rate Last Admin   . cefTRIAXone (ROCEPHIN) 2,000 mg in sodium chloride 0.9 % 50 mL IVPB (mini-bag)  2,000 mg IntraVENous Q24H Herma Mering, MD   Held at 07/23/22 1253   . mirtazapine (REMERON) tablet 7.5 mg  7.5 mg Oral  Nightly Ahmed Lisabeth Pick, MD       . lidocaine PF 1 % injection 1 mL  1 mL IntraDERmal Once PRN Joyice Faster, MD       . lactated ringers IV soln infusion   IntraVENous Continuous Joyice Faster, MD       . sodium chloride flush 0.9 % injection 5-40 mL  5-40 mL IntraVENous 2 times per day Joyice Faster, MD       . sodium chloride flush 0.9 % injection 5-40 mL  5-40 mL IntraVENous PRN Joyice Faster, MD       . 0.9 % sodium chloride infusion   IntraVENous PRN Joyice Faster, MD       . ALPRAZolam Duanne Moron) tablet 0.25 mg  0.25 mg Oral BID PRN Tad Moore, MD       . oxyCODONE (ROXICODONE) immediate release tablet 5 mg  5 mg Oral Q4H PRN Wilhelmina Mcardle, MD       . diphenhydrAMINE (BENADRYL) capsule 25 mg  25 mg Oral Q6H PRN Sandrea Hughs, MD   25 mg at 07/17/22 2039   . sevelamer (  RENVELA) packet 2.4 g  2.4 g Oral TID WC Caren Griffins, MD   2.4 g at 07/22/22 1751   . heparin (porcine) 1000 UNIT/ML injection 1,800 Units  1,800 Units IntraCATHeter PRN Darlin Coco, MD   1,800 Units at 07/22/22 0950    And   . heparin (porcine) 1000 UNIT/ML injection 1,800 Units  1,800 Units IntraCATHeter PRN Darlin Coco, MD   1,800 Units at 07/22/22 0950   . guaiFENesin (ROBITUSSIN) 100 MG/5ML liquid 200 mg  200 mg Oral Q4H PRN Greig Castilla, APRN - NP   200 mg at 07/04/22 0643   . heparin (porcine) injection 5,000 Units  5,000 Units SubCUTAneous 3 times per day Jeannetta Nap, MD   5,000 Units at 07/22/22 2106   . therapeutic multivitamin-minerals 1 tablet  1 tablet Per NG tube Daily Jeannetta Nap, MD   1 tablet at 07/22/22 1132   . amiodarone (CORDARONE) tablet 200 mg  200 mg Per NG tube Daily Ashik Bajracharya, MD   200 mg at 07/23/22 1016   . acidophilus probiotic capsule 1 capsule  1 capsule Oral Daily Jeannetta Nap, MD   1 capsule at 07/22/22 1132   . lidocaine 2 % injection 20 mL  20 mL IntraDERmal Once Larita Fife, MD       . collagenase ointment   Topical Daily Girard Cooter, MD   Given at  07/22/22 1132   . epoetin alfa-epbx (RETACRIT) injection 6,000 Units  6,000 Units SubCUTAneous Once per day on Mon Wed Fri Darlin Coco, MD   6,000 Units at 07/22/22 2105   . fentaNYL (SUBLIMAZE) injection 25 mcg  25 mcg IntraVENous Q1H PRN Wilhelmina Mcardle, MD   25 mcg at 06/22/22 1107   . midodrine (PROAMATINE) tablet 5 mg  5 mg Oral Daily PRN Emelda Brothers, MD   5 mg at 06/20/22 1650   . insulin lispro (HUMALOG) injection vial 0-4 Units  0-4 Units SubCUTAneous 4 times per day Emelda Brothers, MD   1 Units at 06/23/22 1151   . bisacodyl (DULCOLAX) suppository 10 mg  10 mg Rectal Daily PRN Graylon Gunning, MD       . albumin human 25% IV solution 25 g  25 g IntraVENous PRN Sherrlyn Hock, MD   Stopped at 07/13/22 0950   . sodium chloride 0.9 % bolus 100 mL  100 mL IntraVENous PRN Lindaann Pascal, MD       . balsum peru-castor oil (VENELEX) ointment   Topical BID Lyndal Pulley, APRN - NP   Given at 07/22/22 2106   . glucose chewable tablet 16 g  4 tablet Oral PRN Guido Sander, APRN - CNS   16 g at 06/29/22 1548   . dextrose bolus 10% 125 mL  125 mL IntraVENous PRN Guido Sander, APRN - CNS        Or   . dextrose bolus 10% 250 mL  250 mL IntraVENous PRN Guido Sander, APRN - CNS       . glucagon (rDNA) injection 1 mg  1 mg SubCUTAneous PRN Guido Sander, APRN - CNS       . dextrose 10 % infusion   IntraVENous Continuous PRN Guido Sander, APRN - CNS       . sodium chloride flush 0.9 % injection 5-40 mL  5-40 mL IntraVENous 2 times per day Lyndal Pulley, APRN - NP   10 mL at 07/22/22 1133   . sodium chloride flush 0.9 %  injection 5-40 mL  5-40 mL IntraVENous PRN Lyndal Pulley, APRN - NP       . 0.9 % sodium chloride infusion   IntraVENous PRN Lyndal Pulley, APRN - NP       . acetaminophen (TYLENOL) tablet 650 mg  650 mg Oral Q6H PRN Lyndal Pulley, APRN - NP   650 mg at 07/21/22 0902    Or   . acetaminophen (TYLENOL) suppository 650 mg  650 mg Rectal Q6H PRN Lyndal Pulley, APRN - NP   650 mg at 06/01/22 1230   . ondansetron (ZOFRAN) injection 4 mg  4 mg IntraVENous Q6H PRN Lyndal Pulley, APRN - NP   4 mg at 07/05/22 6468       Allergies:    Allergies   Allergen Reactions   . Augmentin [Amoxicillin-Pot Clavulanate] Hives     Tolerated ceftriaxone 06/2022   . Codeine      Unknown reaction   . Oxycodone Itching       Problem List:    Patient Active Problem List   Diagnosis Code   . Gastric perforation ([place]) K25.5   . Type 2 diabetes mellitus with hyperglycemia, without long-term current use of insulin (HCC) E11.65   . Intestinal obstruction ([place]) K56.609   . Severe sepsis (HCC) A41.9, R65.20   . Septic shock (HCC) A41.9, R65.21   . E coli infection A49.8   . Liver abscess K75.0   . Pneumoperitoneum K66.8   . Acute respiratory failure with hypoxia (HCC) J96.01   . AKI (acute kidney injury) ([place]) N17.9   . 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 Thrombocytopenia ([place]) D69.6   . E coli bacteremia R78.81, B96.20   . Hepatic abscess K75.0   . Peripheral arterial disease (HCC) I73.9   . Hyperbilirubinemia E80.6   . Gram negative sepsis ([place]) A41.50   . Septicemia ([place]) A41.9   . Aspiration pneumonia of both lower lobes ([place]) J69.0   . Gangrene of toe of both feet ([place]) I96   . Bandemia [id number]   . Acute pancreatitis K85.90   . Wound dehiscence T81.30XA       Past Medical History:        Diagnosis Date   . Diabetes mellitus ([place])    . Hypertension        Past Surgical History:        Procedure Laterality Date   . BLADDER SURGERY N/A 06/01/2022    ENDOSCOPY OF ILEAL CONDUIT performed by Melene Plan, MD at [facility] MAIN OR   . CT VISCERAL PERCUTANEOUS DRAIN  06/05/2022    CT VISCERAL PERCUTANEOUS DRAIN 06/05/2022 [facility] RAD CT   . IR TUNNELED CATHETER PLACEMENT GREATER THAN 5 YEARS  07/01/2022    IR TUNNELED CATHETER PLACEMENT GREATER THAN 5 YEARS 07/01/2022 [facility], APRN - NP [facility] ANGIO IR   . LAPAROSCOPY N/A 06/01/2022    LAPAROSCOPY DIAGNOSTIC performed by  Melene Plan, MD at [facility]   . LAPAROTOMY N/A 06/01/2022    LAPAROTOMY EXPLORATORY performed by Melene Plan, MD at [facility] MA[facility]       Social History:    Social History     Tobacco Use   . Smoking status: Never   . Smokeless tobacco: Never   Substance Use Topics   . Alcohol use: Not on file  Counseling given: Not Answered      Vital Signs (Current):   Vitals:    07/23/22 0325 07/23/22 0812 07/23/22 1203 07/23/22 1524   BP: 131/64 139/76 (!) 141/72 (!) 152/63   Pulse: 84 86 83 89   Resp: _0 Temp: 97.6 F (36.4 C) 97.7 F (36.5 C) 97.5 F (36.4 C) 98.1 F (36.7 C)   TempSrc: Oral  Oral Oral   SpO2:  97% 97% 99%   Weight:       Height:                                                  BP Readings from Last 3 Encounters:   07/23/22 (!) 152/63       NPO Status: Time of last liquid consumption: 2000                        Time of last solid consumption: 2000                        Date of last liquid consumption: 06/30/22                        Date of last solid food consumption: 06/30/22    BMI:   Wt Readings from Last 3 Encounters:   07/22/22 91.9 kg (202 lb 9.6 oz)     Body mass index is 30.81 kg/m.    CBC:   Lab Results   Component Value Date/Time    WBC 10.9 07/22/2022 07:34 AM    RBC 2.54 07/22/2022 07:34 AM    HGB 8.1 07/22/2022 07:34 AM    HCT 25.7 07/22/2022 07:34 AM    MCV 101.2 07/22/2022 07:34 AM    RDW 15.6 07/22/2022 07:34 AM    PLT 187 07/22/2022 07:34 AM       CMP:   Lab Results   Component Value Date/Time    NA 134 07/22/2022 07:34 AM    K 3.5 07/22/2022 07:34 AM    CL 99 07/22/2022 07:34 AM    CO2 31 07/22/2022 07:34 AM    BUN 27 07/22/2022 07:34 AM    CREATININE 3.88 07/22/2022 07:34 AM    LABGLOM 15 07/22/2022 07:34 AM    GLUCOSE 127 07/22/2022 07:34 AM    PROT 6.4 07/13/2022 03:19 AM    CALCIUM 8.4 07/22/2022 07:34 AM    BILITOT 0.9 07/13/2022 03:19 AM    ALKPHOS 233 07/13/2022 03:19 AM    AST 26 07/13/2022 03:19 AM    ALT 17 07/13/2022 03:19 AM       POC  Tests:   Recent Labs     07/23/22  1203   POCGLU 111       Coags:   Lab Results   Component Value Date/Time    PROTIME 16.0 06/14/2022 06:46 AM    INR 1.6 06/14/2022 06:46 AM       HCG (If Applicable): No results found for: "PREGTESTUR", "PREGSERUM", "HCG", "HCGQUANT"     ABGs:   Lab Results   Component Value Date/Time    PHART 7.44 06/14/2022 07:44 AM    PO2ART 134 06/14/2022 07:44 AM    PCO2ART 27 06/14/2022 07:44 AM    HCO3ART 18 06/14/2022 07:44  AM        Type & Screen (If Applicable):  No results found for: "LABABO", "[place]"    Drug/Infectious Status (If Applicable):  Lab Results   Component Value Date/Time    HEPCAB 0.31 07/19/2022 06:10 PM       COVID-19 Screening (If Applicable):   Lab Results   Component Value Date/Time    COVID19 Not detected 06/01/2022 02:04 AM           Anesthesia Evaluation  Patient summary reviewed and Nursing notes reviewed  Airway: Mallampati: II  TM distance: >3 FB   Neck ROM: full     Dental: normal exam   (+) poor dentition      Pulmonary:Negative Pulmonary ROS and normal exam                               Cardiovascular:Negative CV ROS  Exercise tolerance: good (>4 METS),   (+) hypertension:,       ECG reviewed  Rhythm: regular  Rate: normal                    Neuro/Psych:   Negative Neuro/Psych ROS              GI/Hepatic/Renal: Neg GI/Hepatic/Renal ROS  (+) liver disease:, renal disease: ARF and dialysis,           Endo/Other: Negative Endo/Other ROS   (+) DiabetesType II DM, , .          Pt had PAT visit.       Abdominal:              PE comment: Deferred   Vascular: negative vascular ROS.  + PVD, aortic or cerebral, .       Other Findings:           Anesthesia Plan      MAC     ASA 3       Induction: intravenous.      Anesthetic plan and risks discussed with patient.      Plan discussed with CRNA.    Attending anesthesiologist reviewed and agrees with Preprocedure content                Joyice Faster, MD   07/23/2022

## 2022-07-24 LAB — CBC
Hematocrit: 27 % — ABNORMAL LOW (ref 36.6–50.3)
Hemoglobin: 8.4 g/dL — ABNORMAL LOW (ref 12.1–17.0)
MCH: 31.9 PG (ref 26.0–34.0)
MCHC: 31.1 g/dL (ref 30.0–36.5)
MCV: 102.7 FL — ABNORMAL HIGH (ref 80.0–99.0)
MPV: 10.1 FL (ref 8.9–12.9)
Nucleated RBCs: 0 PER 100 WBC
Platelets: 206 10*3/uL (ref 150–400)
RBC: 2.63 M/uL — ABNORMAL LOW (ref 4.10–5.70)
RDW: 15.3 % — ABNORMAL HIGH (ref 11.5–14.5)
WBC: 12.1 10*3/uL — ABNORMAL HIGH (ref 4.1–11.1)
nRBC: 0 10*3/uL (ref 0.00–0.01)

## 2022-07-24 LAB — POCT GLUCOSE
POC Glucose: 179 mg/dL — ABNORMAL HIGH (ref 65–117)
POC Glucose: 171 mg/dL — ABNORMAL HIGH (ref 65–117)
POC Glucose: 121 mg/dL — ABNORMAL HIGH (ref 65–117)
POC Glucose: 154 mg/dL — ABNORMAL HIGH (ref 65–117)

## 2022-07-24 LAB — BASIC METABOLIC PANEL
Anion Gap: 4 mmol/L — ABNORMAL LOW (ref 5–15)
BUN: 26 MG/DL — ABNORMAL HIGH (ref 6–20)
Bun/Cre Ratio: 8 — ABNORMAL LOW (ref 12–20)
CO2: 29 mmol/L (ref 21–32)
Calcium: 8.3 MG/DL — ABNORMAL LOW (ref 8.5–10.1)
Chloride: 102 mmol/L (ref 97–108)
Creatinine: 3.12 MG/DL — ABNORMAL HIGH (ref 0.70–1.30)
Est, Glom Filt Rate: 20 mL/min/{1.73_m2} — ABNORMAL LOW (ref >60)
Glucose: 191 mg/dL — ABNORMAL HIGH (ref 65–100)
Potassium: 4.6 mmol/L (ref 3.5–5.1)
Sodium: 135 mmol/L — ABNORMAL LOW (ref 136–145)

## 2022-07-24 NOTE — Progress Notes (Signed)
Hospitalist Progress Note    NAME:   Jaime [name]   DOB: [birth year]   MRN: [id number]     Date/Time: 07/24/2022 1:44 PM  Patient PCP: No primary care provider on file.    Estimated discharge date: Next week  Barriers: Final antibiotic, podiatry clearance    For reference :   76 y/m with prolonged hospitalization, was admitted to surgical service on 7/31 for perforated hollow viscus underwent emergent laparotomy/ septic shock d/t peritonitis, was intubated and extubated x 2. Also had AKI needing CVVH, now on HD. Also has bilateral foot gangrene. Was transferred to hospitalist service on 8/21.     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 Other option could be to go to an SNF in [place] [facility] ) .     9/13:on Omnicef p.o. due to lack of IV access.   plan to continue Omnicef p.o. x2 more weeks  ending 8 weeks on 9/ 29.  9/15: Patient stable   9/16: Medically stable   Awaiting placement   continue Omnicef p.o.until  on 9/ 29.  9/17  2 episodes of fever 100.4-relieved with Tylenol  No gross symptoms of infection  Examination unremarkable  Patient on on antibiotics-we will monitor for the fever  If persistent high-grade fever will start work-up  ID following  9/18  Remained afebrile overnight  No new leukocyte noted on CBC  Continue with antibiotic as per ID  9/19 and 9/20:  No acute  issues, continue with the same management    Antimicrobial orders for discharge  -Omnicef 300 mg p.o. daily end date 9/29  -- Pt will require repeat imaging of liver before conclusion of therapy  -Weekly CBC, CMP-  -Encourage adequate fluids, daily probiotic/yogurt  -ID follow-up - if pt remains in [place] of [place] call [phone]      #Acute hypoxemic respiratory failure:  Intubated 7/31, extubation 8/7  -Reintubated 8/13, extubated on 8/19  -Currently on 2 liters [place]  -Weaned off o2      #Dysphagia:  -Ongoing SLP evaluation-patient tolerating small diet .   -Aspiration on FEES study  -Follow-up SLP evaluation     #Acute kidney injury:  -Now on HD  -Continue with HD  -Nephrology following  -Case management informed the need to set up HD at SNF prior to discharge .            #Diabetes mellitus:  -Sliding scale and lantus  -Monitor FS  -Diabetes team following     #Sacral wound:  -Wound care following    Pending placement        Medical Decision Making:   I personally reviewed labs:   I personally reviewed imaging:  I personally reviewed EKG:  Toxic drug monitoring:   Discussed case with: Patient , RN        Code Status: Full  DVT Prophylaxis: Heparin   GI Prophylaxis:    Subjective:     Chief Complaint / Reason for Physician Visit  "Follow up case. No active complaints. ".  Discussed with RN events overnight.       Objective:     VITALS:   Last 24hrs VS reviewed since prior progress note. Most recent are:  Patient Vitals for the past 24 hrs:   BP Temp Temp src Pulse Resp SpO2   07/24/22 1302 (!) 158/72 -- -- 87 -- --   07/24/22 1130 138/70 97.4 F (36.3 C) -- 84 16 --   07/24/22 1116 (!) 148/80 -- -- 83 -- --   07/24/22 1100 (!) 147/81 -- -- 83 -- --   07/24/22 1045 137/73 -- -- 81 -- --   07/24/22 1030 137/71 -- -- 80 -- --   07/24/22 1015 125/69 -- -- 81 -- --   07/24/22 1000 (!) 144/73 -- -- 78 -- --   07/24/22 0945 126/67 -- -- 80 -- --   07/24/22 0930 (!) 110/58 -- -- 83 -- --   07/24/22 0915 119/68 -- -- 81 --  --   07/24/22 0900 125/67 -- -- 82 -- --   07/24/22 0845 129/66 97.5 F (36.4 C) -- 82 16 --   07/24/22 0830 135/71 -- -- 77 -- --   07/24/22 0816 (!) 146/75 -- -- 78 -- --   07/24/22 0800 (!) 141/66 97.5 F (36.4 C) -- 81 16 --   07/24/22 0429 135/66 -- -- 82 -- --   07/24/22 0426 -- 98.1 F (36.7 C) Oral -- 18 --   07/24/22 0001 118/69 97.5 F (36.4 C) Oral 97 18 --   07/23/22 1959 123/68 98 F (36.7 C) Oral 93 18 97 %   07/23/22 1745 (!) 140/60 98.2 F (36.8 C) Oral 82 21 99 %   07/23/22 1730 135/64 -- -- 84 19 100 %   07/23/22 1715 126/66 -- -- 80 15 98 %   07/23/22 1710 130/63 -- -- 83 21 95 %   07/23/22 1705 132/60 -- -- 79 15 99 %   07/23/22 1700 123/61 -- -- 81 25 98 %   07/23/22 1655 108/68 -- -- 75 13 100 %   07/23/22 1652 (!) 137/58 97.8 F (36.6 C) Oral 76 22 100 %   07/23/22 1650 (!) 137/58 -- -- -- -- --   07/23/22 1524 (!) 152/63 98.1 F (36.7 C) Oral 89 19 99 %           Intake/Output Summary (Last 24 hours) at 07/24/2022 1344  Last data filed at 07/24/2022 1130  Gross per 24 hour   Intake 750 ml   Output 1010 ml   Net -260 ml              I had a face to face encounter and independently examined this patient on 07/24/2022, as outlined below:  PHYSICAL EXAM:  General: Alert, cooperative  EENT:  EOMI. Anicteric sclerae..Rt. hd catn - jugular   Resp:  CTA bilaterally, no wheezing or rales.  No accessory muscle use  CV:  Regular  rhythm,  No edema  GI:  Soft, Non distended, Non tender.  +Bowel sounds, wound vac in place , drain on rt upper quadrant   Neurologic:  Alert and oriented X 3, normal speech,   Psych:   Good insight. Not anxious nor agitated  Foot :  bilateral feet - gangrene of most of the toes  ,  not yet full demarcation     Reviewed most current lab test results and cultures  YES  Reviewed most current radiology test results   YES  Review and summation of old records today    NO  Reviewed patient's current orders and MAR    YES  PMH/SH reviewed - no change compared to  H&P  ________________________________________________________________________  Care Plan discussed with:    Comments   Patient x    Family      RN x    Care Manager     Consultant                        Multidiciplinary team rounds were held today with case manager, nursing, pharmacist and Higher education careers adviser.  Patient's plan of care was discussed; medications were reviewed and discharge planning was addressed.     ________________________________________________________________________  Total NON critical care TIME:  25  Minutes    Total CRITICAL CARE TIME Spent:   Minutes non procedure based      Comments   >50% of visit spent in counseling and coordination of care     ________________________________________________________________________  Terence Lux, MD     Procedures: see electronic medical records for all procedures/Xrays and details which were not copied into this note but were reviewed prior to creation of Plan.      LABS:  I reviewed today's most current labs and imaging studies.  Pertinent labs include:  Recent Labs     07/22/22  0734 07/24/22  0049   WBC 10.9 12.1*   HGB 8.1* 8.4*   HCT 25.7* 27.0*   PLT 187 206         Recent Labs     07/22/22  0734 07/23/22  1536 07/24/22  0049   NA 134*  --  135*   K 3.5  --  4.6   CL 99  --  102   CO2 31  --  29   GLUCOSE 127*  --  191*   BUN 27*  --  26*   CREATININE 3.88* 2.7* 3.12*   CALCIUM 8.4*  --  8.3*   PHOS 2.9  --   --    LABALBU 1.9*  --   --            Signed: Terence Lux, MD

## 2022-07-24 NOTE — Progress Notes (Signed)
Infectious Disease Progress    Impression      Hepatic abscess  Gas and fluid containing collection 7.1 x 10.8 x 5.4 cm  In anterior left lobe of liver, patchy areas of hypodensity right lobe  4 x 4.5 x 5.5 cm collection  S/p drainage by IR 8/4.Cultures + for  few E.coli  CT abdomen/pelvis 9/12+ for collection 5.8 x 3.3 cm  Previously 5 x 3 cm.  Peripancreatic fatty infiltration & stranding again noted.  Patient changed to [facility] 9/12 2ry to lack of IV access  Patient doing well on  Omnicef, no adverse side effects.  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    Sepsis  Septic shock.  Resolved    E. coli bacteremia  Blood cultures 7/31+ for E. coli   2/2 LAC (pan sensitive)   Negative repeat cultures 8/4   Treated.    Acute abdomen  Pneumoperitoneum  S/p diagnostic laparoscopy, laparotomy  EGD 7/31  Findings of cloudy fluid in right upper quadrant, yellow/white  Inflammatory peel over lesser curve of stomach, inferior lobe of liver  No gross perforation identified  Pancreas and retroperitoneum appeared edematous  Intra-Op fluid culture + for E. Coli (pansensitive)      S/p abdominal wound dehiscence  Clean, wound is healing  Cultures 8/17, 8/18-NG.    Leukocytosis  WBC 12.1    Acute hypoxic respiratory failure  Re intubated 8/13, extubated 8/19  Initially intubated 7/31, s/p extubation 8/7  CXR  8/20 + + for atelectasis  Resp cultures  8/13+ for light yeast  Apparent C.albicans/ dubliensis        AKI  Cr 3.12 on HD    Coagulopathy  Improving    Thrombocytopenia  resolved      Transaminitis  Improving    Hyperbilirubinemia   Improved      Diabetes type 2  Hyperglycemia  A1c 7.7    Diarrhea  resolved    Obesity  BMI 34.76  Plan  Continue ceftriaxone 2 g IV daily   Antibiotic coverage extended to 8 weeks end date   9/29 given persistence of abscess on repeat CT.  Empty drain q shift please  Pt will require repeat imaging of liver before conclusion of therapy  D/w  patient.            Extensive review of chart notes, labs, imaging, cultures done  Additionally review of done: Drain output      Patient seen  today.  Awake, denies new complaints.  Working with PT and OT  Minimal drainage output noted in bag.      Past Medical History:   Diagnosis Date    Diabetes mellitus (HCC)     Hypertension        Past Surgical History:   Procedure Laterality Date    BLADDER SURGERY N/A 06/01/2022    ENDOSCOPY OF ILEAL CONDUIT performed by Guinevere Ferrari, MD at [facility] OR    CT VISCERAL PERCUTANEOUS DRAIN  06/05/2022    CT VISCERAL PERCUTANEOUS DRAIN 06/05/2022 [facility] CT    FOOT DEBRIDEMENT Bilateral 07/23/2022    BILATERAL TRANSMETATARSAL AMPUTATION performed by Rosana Fret, DPM at [facility]    IR TUNNELED CATHETER PLACEMENT GREATER THAN 5 YEARS  07/01/2022    IR TUNNELED CATHETER PLACEMENT GREATER THAN 5 YEARS 07/01/2022 [facility] [place], APRN - NP [facility] ANGIO IR    LAPAROSCOPY N/A 06/01/2022  LAPAROSCOPY DIAGNOSTIC performed by Melene Plan, MD at [facility]    LAPAROTOMY N/A 06/01/2022    LAPAROTOMY EXPLORATORY performed by Melene Plan, MD at [facility]       Allergies   Allergen Reactions    Augmentin [Amoxicillin-Pot Clavulanate] Hives     Tolerated ceftriaxone 06/2022    Codeine      Unknown reaction    Oxycodone Itching       Social Connections: Not on file       No family status information on file.           Review of Systems - Negative except those mentioned in H&P      PHYSICAL EXAM:  General:          Awake, in no distress  EENT:              EOMI. Anicteric sclerae. MMM  Resp:               CTA bilaterally, no wheezing or rales.  No accessory muscle use  CV:                  Regular  rhythm,  No edema  GI:                   Soft, Non distended, Non tender.  +Bowel sounds  Neurologic:      Alert and oriented X 3, normal speech,   Psych:             Good insight. Not anxious nor agitated  Skin:                No rashes.  No jaundice.  Extremities :  No edema, discoloration  of  toes++.   Drain+ greenish drainage.    Lurena Nida, MD Rosalita Chessman

## 2022-07-24 NOTE — Progress Notes (Signed)
NAME: Jaime [name]        DOB:  1946-05-23        MRN:  [id number]                     Assessment   :                                               Plan:  AKI  --> now ESKD   Pancreatitis  Hyponatremia  DM  Anemia  Toe ischemia KRT initiated 7/31; now on MWF schedule; perm cath 8/30.    Needs outpatient HD placement under ESKD diagnosis - CM working on options (here or back in [employer])    Watch for renal recovery; creatinine a tad lower today? Check again on Monday.    H/H not at goal.  Continue ESA.    bilateral tma 9/21    Will see again on Monday, but please call with questions or changes in the meantime.                 Subjective:     Chief Complaint: sleeping comfortably. The patient was seen on dialysis at 9:46 AM .  BP is stable. Catheter is functioning well.  D/w HD nurse.    Review of Systems:    Symptom Y/N Comments  Symptom Y/N Comments   Fever/Chills    Chest Pain     Poor Appetite    Edema     Cough    Abdominal Pain     Sputum    Joint Pain     SOB/DOE    Pruritis/Rash     Nausea/vomit    Tolerating PT/OT     Diarrhea    Tolerating Diet     Constipation    Other       Could not obtain due to:      Objective:     VITALS:   Last 24hrs VS reviewed since prior progress note. Most recent are:  Vitals:    07/24/22 0426   BP:    Pulse:    Resp: 18   Temp: 98.1 F (36.7 C)   SpO2:        Intake/Output Summary (Last 24 hours) at 07/24/2022 0544  Last data filed at 07/23/2022 1655  Gross per 24 hour   Intake 260 ml   Output 595 ml   Net -335 ml        Telemetry Reviewed:     PHYSICAL EXAM:  General: NAD  trace edema  R TDC intact  B/l toes- dry gangrene      Lab Data Reviewed: (see below)    Medications Reviewed: (see below)    PMH/SH reviewed - [name] change compared to H&P  ________________________________________________________________________  Care Plan discussed with:  Patient     Family      RN     Care Manager                    Consultant:           Comments   >50% of visit spent in counseling and coordination of care       ________________________________________________________________________  Erenest Rasher, MD     Procedures: see electronic medical records for all procedures/Xrays and details which  were not copied into this note but were reviewed  prior to creation of Plan.      LABS:  Recent Labs     07/22/22  0734 07/24/22  0049   WBC 10.9 12.1*   HGB 8.1* 8.4*   HCT 25.7* 27.0*   PLT 187 206       Recent Labs     07/22/22  0734 07/24/22  0049   NA 134* 135*   K 3.5 4.6   CL 99 102   CO2 31 29   BUN 27* 26*   PHOS 2.9  --          MEDICATIONS:  Current Facility-Administered Medications   Medication Dose Route Frequency    cefTRIAXone (ROCEPHIN) 2,000 mg in sodium chloride 0.9 % 50 mL IVPB (mini-bag)  2,000 mg IntraVENous Q24H    mirtazapine (REMERON) tablet 7.5 mg  7.5 mg Oral Nightly    ALPRAZolam (XANAX) tablet 0.25 mg  0.25 mg Oral BID PRN    oxyCODONE (ROXICODONE) immediate release tablet 5 mg  5 mg Oral Q4H PRN    diphenhydrAMINE (BENADRYL) capsule 25 mg  25 mg Oral Q6H PRN    sevelamer (RENVELA) packet 2.4 g  2.4 g Oral TID WC    heparin (porcine) 1000 UNIT/ML injection 1,800 Units  1,800 Units IntraCATHeter PRN    And    heparin (porcine) 1000 UNIT/ML injection 1,800 Units  1,800 Units IntraCATHeter PRN    guaiFENesin (ROBITUSSIN) 100 MG/5ML liquid 200 mg  200 mg Oral Q4H PRN    heparin (porcine) injection 5,000 Units  5,000 Units SubCUTAneous 3 times per day    therapeutic multivitamin-minerals 1 tablet  1 tablet Per NG tube Daily    amiodarone (CORDARONE) tablet 200 mg  200 mg Per NG tube Daily    acidophilus probiotic capsule 1 capsule  1 capsule Oral Daily    collagenase ointment   Topical Daily    epoetin alfa-epbx (RETACRIT) injection 6,000 Units  6,000 Units SubCUTAneous Once per day on Mon Wed Fri    fentaNYL (SUBLIMAZE) injection 25 mcg  25 mcg IntraVENous Q1H PRN    midodrine (PROAMATINE) tablet 5 mg  5 mg Oral Daily PRN    insulin  lispro (HUMALOG) injection vial 0-4 Units  0-4 Units SubCUTAneous 4 times per day    bisacodyl (DULCOLAX) suppository 10 mg  10 mg Rectal Daily PRN    albumin human 25% IV solution 25 g  25 g IntraVENous PRN    sodium chloride 0.9 % bolus 100 mL  100 mL IntraVENous PRN    balsum peru-castor oil (VENELEX) ointment   Topical BID    glucose chewable tablet 16 g  4 tablet Oral PRN    dextrose bolus 10% 125 mL  125 mL IntraVENous PRN    Or    dextrose bolus 10% 250 mL  250 mL IntraVENous PRN    glucagon (rDNA) injection 1 mg  1 mg SubCUTAneous PRN    dextrose 10 % infusion   IntraVENous Continuous PRN    sodium chloride flush 0.9 % injection 5-40 mL  5-40 mL IntraVENous 2 times per day    sodium chloride flush 0.9 % injection 5-40 mL  5-40 mL IntraVENous PRN    0.9 % sodium chloride infusion   IntraVENous PRN    acetaminophen (TYLENOL) tablet 650 mg  650 mg Oral Q6H PRN    Or    acetaminophen (TYLENOL) suppository 650 mg  650 mg Rectal Q6H PRN    ondansetron (ZOFRAN) injection 4 mg  4 mg IntraVENous Q6H PRN

## 2022-07-24 NOTE — Other (Signed)
Primary RN SBAR: Laurie Panda, RN  Patient Education: Dialysis procedure  Hepatitis B Surface Ag   Date/Time Value Ref Range Status   07/19/2022 06:10 PM <0.10 Index Final     Hep B S Ab   Date/Time Value Ref Range Status   06/29/2022 09:53 AM <3.10 mIU/mL Final       07/24/22 0800   Vital Signs   BP (!) 141/66   Temp 97.5 F (36.4 C)   Pulse 81   Respirations 16   Pain Assessment   Pain Assessment None - Denies Pain   Pain Level 0   Treatment   Time On 0816   Treatment Goal 0-1L, 3 hours   Observations & Evaluations   Level of Consciousness 0   Oriented X 3   Heart Rhythm Regular   Respiratory Quality/Effort Unlabored   O2 Device None (Room air)   Bilateral Breath Sounds Clear   Skin Condition/Temp Warm   Edema Generalized Trace   Technical Checks   Dialysis Machine No. 07   RO Machine Number [id number]   Dialyzer Lot No. [id number]   Tubing Lot Number [id number]   All Connections Secure Yes   NS Bag Yes   Saline Line Double Clamped Yes   Dialyzer Revaclear 300   Prime Volume (mL) 200 mL   ICEBOAT I;C;E;B;O;A;T   RO Machine Log Sheet Completed Yes   Machine Alarm Self Test Completed;Passed   Materials engineer Function;pH Reading   Animal nutritionist Conductivity 14   Manual Conductivity   (internal machine test passed)   Machine Ph   (internal machine test passed)   Manual Ph   (internal machine test passed)   Bleach Test (Neg) Yes   Bath Temperature 98.6 F (37 C)   Treatment Initiation   Dialyze Hours 3   Treatment  Initiation Universal Precautions maintained;Lines secured to patient;Connections secured;Prime given;Venous Parameters set;Arterial Parameters set;Chiropodist engaged;[place] Device;Dialysate;Saline line double clamped;REV-300   Dialysis Bath   K+ (Potassium) 3   Ca+ (Calcium) 2.5   Na+ (Sodium) 140   HCO3 (Bicarb) 35   Bicarbonate Concentrate Lot No. [id number]   Acid Concentrate Lot No. [id number]        07/24/22 0800   During Hemodialysis Assessment    BP (!) 141/66   Pulse 81   Temp 97.5 F (36.4 C)   Tunneled Hemodialysis Catheter Right Subclavian   Placement Date: 07/01/22   Present on Admission/Arrival: No  Inserted by: Hope Riddell-NP  Insertion Practices: Chlorohexadine skin antisepsis;Maximal barrier precautions;Sterile ultrasound technique;Optimal catheter site selection;Hand hygiene  Orien...   Continued need for line? Yes   Site Assessment Clean, dry & intact   CVC Lumen Status Flushed;Brisk blood return   Venous Lumen Status Flushed;Brisk blood return   Arterial Lumen Status Flushed;Brisk blood return   Alcohol Cap Used Yes   Line Care Cap changed;Connections checked and tightened;Chlorhexidine wipes;Ports disinfected   Dressing Type Antimicrobial;Transparent   Date of Last Dressing Change 07/24/22   Dressing Status New dressing applied;Clean, dry & intact   Dressing Intervention Dressing changed

## 2022-07-24 NOTE — Care Coordination-Inpatient (Addendum)
Transition of Care Plan:     RUR:  %  Prior Level of Functioning: independent  Disposition: SNF in [place] with HD set up  If SNF or IPR: Date FOC offered: 06/30/22  Date FOC received: wife agrees with plan - prefers closer to [place]  Accepting facility: pending     Date authorization started with reference number:      Date authorization received and expires:   Follow up appointments: new PCP and Specialist in [place] (ID, nephrology, podiatry)  DME needed:   Transportation at discharge: [facility] stretcher transport- CM spoke with   IM/IMM Medicare/Tricare letter given:   Is patient a Veteran and connected with VA?               If yes, was [employer] transfer form completed and VA notified?   Caregiver Contact:   Discharge Caregiver contacted prior to discharge?   Care Conference needed? no  Barriers to discharge: , POD #1  s/p toe amputations, HD setup, PT/OT, insurance authorization, transportation to and from OP HD via stretcher      Patient off floor for HD at this time- spoke with wife Jaime [name] - explained CM has made attempts to reach admissions at [facility] with no response - wife is agreeable to call facility at [phone]- provided her with fax number [phone] to verify as well- directed her to call facilty admissions and that CM would sent medicals and for her to give them CM contact info as well- CM faxed updated medicals to above number will await response.    Explained to wife CM reached out to [employer] supervisor at [facility] to provide suggestions assistance and awaiting response.      Wife made aware that if no facilities in their area in [place] can be located will move forward with pursing placement a [facility] if [place] as they have in house OP HD.  [facility] will need required labs for OP HD set up and also a Form (3875) signed by nephrologist for change of AKI to ESRD for them to take patient.  CM will continue to follow.  Jaime [name], MSW      Jaime [name] wife  spoke with [facility] SNF admissions and confirmed fax number and they would review clinicals- CM refaxed clinicals this afternoon at [phone] - left VM for admissions and awaiting response at this time.  Jaime [name], MSW

## 2022-07-24 NOTE — Progress Notes (Addendum)
0700 Bedside and Verbal shift change report given to [place] (oncoming nurse) by Ailene Ards RN (offgoing nurse). Report included the following information Nurse Handoff Report, Index, ED Encounter Summary, ED SBAR, Intake/Output, MAR, Recent Results, and Cardiac Rhythm NSR .     Braden Score 14. All of the following interventions have been implemented to prevent pressure injury:    SKIN ASSESSMENT (S)  Dual skin assessment completed at shift change: Yes  Name of second RN who completed Dual Skin Assessment: Ashlyn RN  Picture of wound uploaded to EMR: Yes  Venelex ordered and given per protocol: Yes  Wound care consulted if wounds present: Yes    SURFACE (S)  Stryker air pump or specialty bed ordered: Yes  Type of bed: stryker  Only white chux used with specialty surfaces (no green chux used): YES  Waffle cushion used for chair positioning: NA    KEEP MOVING (K)  Mobility status (Bedrest, Chairbound, UWA x 1 assist , 2 assist, Max assist): bedrest  Q2 hour turns documented: Yes  Refusals to turn and education provided documented: NA  Device used to float heels: heel pillow  PT/OT consulted: Yes    INCONTINENCE (I)  Incontinence status assessed Q2 hours: Yes  External catheter in use: NO  Barrier cream in use: YES    NUTRITION (N)  I/O's documented every 8 hours: Yes  Oral supplements ordered if appropriate: Yes  Nutrition services consulted: Yes    All concerns about new DTI's must be escalated directly to attending MD, charge nurse, [place] and nurse director.        0743 Pt off the unit to dialysis suite. Will complete shift assessment upon return to unit    1302 Patient returned from dialysis suite. Shift assessment completed.    End of Shift Note    Bedside shift change report given to [place] (oncoming nurse) by Cherly Anderson, RN (offgoing nurse).  Report included the following information SBAR, Kardex, ED Summary, Intake/Output, MAR, Recent Results, and Cardiac Rhythm NSR    Shift worked:  7a to 7p     Shift summary  and any significant changes:     See above    Accordion drained flushed, remained empty all shift    Had BM today    Patient had HD today, 1/2 L removed    Patient prefers to take pills whole with applesauce    No introducer present, dressing changed on HD and IJ today     Concerns for physician to address:        Zone phone for oncoming shift:           Activity:     Number times ambulated in hallways past shift: 0  Number of times OOB to chair past shift: 0    Cardiac:   Cardiac Monitoring: Yes           Access:  Current line(s): central line     Genitourinary:   Urinary status: oliguric    Respiratory:      Chronic home O2 use?: NO  Incentive spirometer at bedside: NO       GI:     Current diet:  ADULT DIET; Regular; 5 carb choices (75 gm/meal)  ADULT ORAL NUTRITION SUPPLEMENT; Breakfast, Dinner; Renal Oral Supplement  Passing flatus: YES  Tolerating current diet: YES       Pain Management:   Patient states pain is manageable on current regimen: YES    Skin:  Interventions: specialty bed, float heels, increase time out of bed, foam dressing, PT/OT consult, limit briefs, internal/external urinary devices, and nutritional support    Patient Safety:  Fall Score:    Interventions: bed/chair alarm, assistive device (walker, cane. etc), gripper socks, pt to call before getting OOB, stay with me (per policy), and gait belt       Length of Stay:  Expected LOS: 58  Actual LOS: [place], RN

## 2022-07-24 NOTE — Other (Signed)
07/24/22 1130   Vital Signs   BP 138/70   Temp 97.4 F (36.3 C)   Pulse 84   Respirations 16   Pain Assessment   Pain Assessment None - Denies Pain   Pain Level 0   Post-Hemodialysis Assessment   Post-Treatment Procedures Blood returned;Catheter capped, clamped and heparinized x 2 ports   Machine Disinfection Process Acid/Vinegar Clean;Heat Disinfect;Exterior Contractor Volume (ml) 300 ml   Blood Volume Processed (Liters) 64.9 L   Dialyzer Clearance Lightly streaked   Duration of Treatment (minutes) 180 minutes   Heparin Amount Administered During Treatment (mL) 0 mL   Hemodialysis Intake (ml) 500 ml   Hemodialysis Output (ml) 1000 ml   NET Removed (ml) 500   Tolerated Treatment Fair   Interventions Taken Ultrafiltration goal decreased   Patient Response to Treatment Tolerated it well   Bilateral Breath Sounds Clear   Edema None   Time Off 1116   Patient Disposition Return to room     Primary RN SBAR: Frann Rider, RN   Comments: HD treatment completed and well-tolerated. At the end of treatment, all possible blood returned. CVC limb and hubs disinfected per policy. Ports flushed with NS and locked with heparin as ordered. VSS. Pt denies any pain. No SOB. Transferred to room via transport. Report given to Primary RN

## 2022-07-24 NOTE — Plan of Care (Signed)
Problem: Physical Therapy - Adult  Goal: By Discharge: Performs mobility at highest level of function for planned discharge setting.  See evaluation for individualized goals.  Description: FUNCTIONAL STATUS PRIOR TO ADMISSION: Patient was independent and active without use of DME. Drove himself to [place] from [place] for NASCAR race. Denies home O2 use.     HOME SUPPORT PRIOR TO ADMISSION: The patient lived with wife however wife has her own medical issues and cannot provide physical assist.    Physical Therapy Goals  Re-evaluated 07/24/22 s/p bilat TMA, goals revised as patient is now NWB bilat LE's  1.  Patient will move from supine to sit and sit to supine, scoot up and down, and roll side to side in bed with mod assist  within 7 day(s).   2. Patient will sit EOB x 15 minutes with contact guard assist within 7 days.    3.  Patient will perform t-transfer bed<>chair Mod A within 7 days.    Re-assessed 07/21/22; goals remain appropriate  Re-assessed 07/14/22; goals remain appropriate  Re-Assessment 07/07/22 remain appropriate  1.  Patient will move from supine to sit and sit to supine, scoot up and down, and roll side to side in bed with mod assist  within 7 day(s).   2. Patient will sit EOB x 15 minutes with contact guard assist within 7 days.    3.  Patient will perform sit to stand with maximal assistance x2 within 7 day(s). - discontinued 07/24/22  4.  Patient will transfer from bed to chair and chair to bed via minimal lift equipment (best mover) within 7 day(s). - discontinued 07/24/22    Re-Assessment 06/29/22  1.  Patient will move from supine to sit and sit to supine, scoot up and down, and roll side to side in bed with maximal assist of 1 within 7 day(s).   2. Patient will sit EOB x 15 minutes with contact guard assist within 7 days.    3.  Patient will perform sit to stand with maximal assistance x2 within 7 day(s).  4.  Patient will transfer from bed to chair and chair to bed via minimal lift equipment  (best mover) within 7 day(s).    Reassessed 06/20/2022   1.  Patient will move from supine to sit and sit to supine, scoot up and down, and roll side to side in bed with maximal assist of 1 within 7 day(s).   2. Patient will sit EOB x 5 minutes with contact guard assist within 7 days.    3.  Patient will perform sit to stand with maximal assistance x2 within 7 day(s).  4.  Patient will transfer from bed to chair and chair to bed via minimal lift equipment (best mover) within 7 day(s).    Initiated 06/10/2022  1.  Patient will move from supine to sit and sit to supine, scoot up and down, and roll side to side in bed with contact guard assist within 7 day(s).   2. Patient will sit EOB x 10 minutes with supervision/set-up assist within 7 days.    2.  Patient will perform sit to stand with moderate assistance x2 within 7 day(s).  3.  Patient will transfer from bed to chair and chair to bed with moderate assistance x2 using the least restrictive device within 7 day(s).  4.  Patient will ambulate with moderate assistance x2 for 5 feet with the least restrictive device within 7 day(s).   Outcome: Progressing  PHYSICAL THERAPY RE-EVALUATION    Patient: Jaime [name] (76 y.o. male)  Date: 07/24/2022  Primary Diagnosis: Septicemia (HCC) [A41.9]  Gastric perforation (HCC) [K25.5]  Perforated abdominal viscus [R19.8]  Procedure(s) (LRB):  BILATERAL TRANSMETATARSAL AMPUTATION (Bilateral) 1 Day Post-Op   Precautions: Weight Bearing Right Lower Extremity Weight Bearing: Non Weight Bearing Left Lower Extremity Weight Bearing: Non Weight Bearing              ASSESSMENT :  DEFICITS/IMPAIRMENTS:   The patient is limited by decreased functional mobility, independence in ADLs, strength, activity tolerance, coordination, balance, posture, and decreased overall independence following prolonged and complicated hospital course. Patient 1 day s/p bilat TMA, NWB bilat LE's (via verbal order from Dr. Zachery Dauer, therapist informed Dr office of  need of WB order placed in patients chart) .    Based on the impairments listed above patient continues to remain well below his baseline functional status. New NWB status bilat LE's complicated his ability to progress is out of bed mobility, therapist educating patient on importance of improving UE strength so that he will be able to scoot for transfers. Patient remains very motivated and even demonstrated improved tolerance for activity this session compared to prior sessions. Patient completes supine<>sit with Min-Mod A with patient demonstrating good initiation of transition and requiring increased time to complete. Demonstrates good static sitting balance and fair-good dynamic balance while reaching slightly outside BOS in all directions. Instructed patient in seated scapular depression requiring max verbal and tactile cuing for proper performance. Attempted tricep presses against bed to lift buttock, patient unable to coordinate this movement despite max multimodal cuing. Patient tolerated sitting EOB x24.5 minutes today. Returned to supine with all needs reach.     Patient will benefit from skilled intervention to address the above impairments. Continue to recommend rehab at discharge for best possible chance to return to PLOF.     Functional Outcome Measure:  The patient scored 9/24 on the AM-PAC outcome measure        PLAN :  Recommendations and Planned Interventions:   bed mobility training, transfer training, therapeutic exercises, neuromuscular re-education, patient and family training/education, and therapeutic activities    Frequency/Duration: Patient will be followed by physical therapy to address goals, PT Plan of Care: 3 times/week to address goals.    Recommendation for discharge: (in order for the patient to meet his/her long term goals): Therapy 3 hours/day 5-7 days/week    Other factors to consider for discharge: patient's current support system is unable to meet their requirements for physical  assistance, high risk for falls, not safe to be alone, concern for safely navigating or managing the home environment, and new weight bearing restrictions limiting activity or patient is unable to maintain    IF patient discharges home will need the following DME: bedside commode, hospital bed, mechanical lift, and wheelchair 18 inch     SUBJECTIVE:   Patient stated "I am feeling so-so today."    OBJECTIVE DATA SUMMARY:   Hospital course since last seen and reason for re-evaluation:1 day s/p bilat TMA  Past Medical History:   Diagnosis Date    Diabetes mellitus (HCC)     Hypertension      Past Surgical History:   Procedure Laterality Date    BLADDER SURGERY N/A 06/01/2022    ENDOSCOPY OF ILEAL CONDUIT performed by Guinevere Ferrari, MD at [facility]    CT VISCERAL PERCUTANEOUS DRAIN  06/05/2022    CT VISCERAL PERCUTANEOUS DRAIN 06/05/2022 [facility]  CT    FOOT DEBRIDEMENT Bilateral 07/23/2022    BILATERAL TRANSMETATARSAL AMPUTATION performed by Rosana Fret, DPM at [facility]    IR TUNNELED CATHETER PLACEMENT GREATER THAN 5 YEARS  07/01/2022    IR TUNNELED CATHETER PLACEMENT GREATER THAN 5 YEARS 07/01/2022 [facility], APRN - NP [facility] ANGIO IR    LAPAROSCOPY N/A 06/01/2022    LAPAROSCOPY DIAGNOSTIC performed by Guinevere Ferrari, MD at [facility]    LAPAROTOMY N/A 06/01/2022    LAPAROTOMY EXPLORATORY performed by Guinevere Ferrari, MD at [facility]     Home Situation:  Social/Functional History  Lives With: Spouse  Type of Home: House  Home Layout: One level  Home Equipment: None  ADL Assistance: Independent  Ambulation Assistance: Independent  Transfer Assistance: Independent  Active Driver: Yes  Critical Behavior:  Orientation  Overall Orientation Status: Within Normal Limits  Orientation Level: Oriented X4  Cognition  Overall Cognitive Status: WFL    Hearing:   Hearing  Hearing: Within functional limits    Vision/Perceptual:          Vision  Vision: Impaired  Vision Exceptions: Wears glasses at all times     Strength:     Strength: Generally decreased, functional  Tone & Sensation:   Tone: Normal  Sensation: Impaired  Coordination:  Coordination: Generally decreased, functional  Range Of Motion:  AROM: Generally decreased, functional  PROM: Generally decreased, functional    Functional Mobility:  Bed Mobility:  Bed Mobility Training  Bed Mobility Training: Yes  Interventions: Verbal cues;Manual cues  Rolling: Minimum assistance  Sit to Supine: Minimum assistance;Moderate assistance;Additional time  Scooting: Moderate assistance;Maximum assistance  Transfers:  Art therapist: No  Balance:   Balance  Sitting: Impaired  Sitting - Static: Good (unsupported)  Sitting - Dynamic: Fair (occasional)  Standing:  (not tested; NWB bilat LE)  NMR x12 minutes with patient sitting EOB CGA for safety; reaching bilat hands forward to lean on therapists knees x5, leaning on forearms x3 reps; reaching toward target in all directions x 5 minutes (patient requiring frequent cuing to hold head upright and look forward); noted sustained nystagmus bilat eyes while sitting when patient would look forward    [employer] AM-PAC      Basic Mobility Inpatient Short Form (6-Clicks) Version 2    How much help is needed turning from your back to your side while in a flat bed without using bedrails?: A Little  How much help is needed moving from lying on your back to sitting on the side of a flat bed without using bedrails?: A Lot  How much help is needed moving to and from a bed to a chair?: Total  How much help is needed standing up from a chair using your arms?: Total  How much help is needed walking in hospital room?: Total  How much help is needed climbing 3-5 steps with a railing?: Total    AM-PAC Inpatient Mobility Raw Score : 9  AM-PAC Inpatient T-Scale Score : 30.55     Cutoff score ?171,2,3 had higher odds of discharging home with home health or need of SNF/IPR.    1. Emelia Loron, Janeece Riggers, Vinoth Fransico Meadow,  Lupe Carney Passek, Thornton Dales. Cassandria Anger.  Validity of the AM-PAC "6-Clicks" Inpatient Daily Activity and Basic Mobility Short Forms. Physical Therapy Mar 2014, 94 (3) 379-391; DOI: 10.2522/ptj.20130199  2. Venetia Night. Association of  AM-PAC "6-Clicks" Basic Mobility and Daily Activity Scores With Discharge Destination. Phys Ther. 2021 Apr 4;101(4):pzab043. doi: 10.1093/ptj/pzab043. PMID: 96045409.  Delta, Rajaraman D, Tanna Furry, Agayby K, [place] S. Activity Measure for Post-Acute Care "6-Clicks" Basic Mobility Scores Predict Discharge Destination After Acute Care Hospitalization in Select Patient Groups: A Retrospective, Observational Study. Arch Rehabil Res Clin Transl. 2022 Jul 16;4(3):100204. doi: 10.1016/j.arrct.8119.147829. PMID: 56213086; PMCID: [id number].  4. Vonzell Schlatter, Coster W, Ni P. AM-PAC Short Forms Manual 4.0. Revised 12/2018.                                                                                                                                                                                                                           Pain Rating:  0/10     Activity Tolerance:   Fair , requires rest breaks, and SpO2 stable on room air    After treatment:   Patient left in no apparent distress in bed, Call bell within reach, Bed/ chair alarm activated, Side rails x3, Heels elevated for pressure relief, and Patient offloaded in partial R side lying for pressure relief    COMMUNICATION/EDUCATION:   The patient's plan of care was discussed with: registered nurse    Patient Education  Education Given To: Patient  Education Provided: Plan of Care;Home Exercise Program;Fall Prevention Strategies;Transfer Training;Precautions  Education Provided Comments: NWB status bilat LE  Education Method: Verbal  Barriers to Learning: None  Education Outcome: Verbalized understanding;Continued education needed    Thank you for this referral.  Scarleth Brame,  PT  Minutes: 70

## 2022-07-24 NOTE — Progress Notes (Signed)
Occupational Therapy    Attempted to see patient, but he has been off the floor this morning for HD. Will defer OT revaluation, now s/p B TMAs and is NWB. Will follow back later today or tomorrow. Unfortunately the patient does not historically tolerate activity well after HD.     Claiborne Stroble P Danayah Smyre, OTR/L

## 2022-07-24 NOTE — Plan of Care (Signed)
Problem: Safety - Adult  Goal: Free from fall injury  Outcome: Progressing     Problem: Respiratory - Adult  Goal: Achieves optimal ventilation and oxygenation  Outcome: Progressing     Problem: Pain  Goal: Verbalizes/displays adequate comfort level or baseline comfort level  Outcome: Progressing     Problem: Discharge Planning  Goal: Discharge to home or other facility with appropriate resources  Outcome: Progressing     Problem: Chronic Conditions and Co-morbidities  Goal: Patient's chronic conditions and co-morbidity symptoms are monitored and maintained or improved  Outcome: Progressing     Problem: Neurosensory - Adult  Goal: Achieves maximal functionality and self care  Outcome: Progressing     Problem: Cardiovascular - Adult  Goal: Maintains optimal cardiac output and hemodynamic stability  Outcome: Progressing  Goal: Absence of cardiac dysrhythmias or at baseline  Outcome: Progressing     Problem: Gastrointestinal - Adult  Goal: Maintains or returns to baseline bowel function  Outcome: Progressing  Goal: Maintains adequate nutritional intake  Outcome: Progressing     Problem: Genitourinary - Adult  Goal: Absence of urinary retention  Outcome: Progressing     Problem: Metabolic/Fluid and Electrolytes - Adult  Goal: Electrolytes maintained within normal limits  Outcome: Progressing  Goal: Hemodynamic stability and optimal renal function maintained  Outcome: Progressing  Goal: Glucose maintained within prescribed range  Outcome: Progressing     Problem: Skin/Tissue Integrity - Adult  Goal: Skin integrity remains intact  Outcome: Progressing  Goal: Incisions, wounds, or drain sites healing without S/S of infection  Outcome: Progressing  Goal: Oral mucous membranes remain intact  Outcome: Progressing     Problem: Hematologic - Adult  Goal: Maintains hematologic stability  Outcome: Progressing     Problem: Musculoskeletal - Adult  Goal: Return mobility to safest level of function  Outcome: Progressing  Goal:  Maintain proper alignment of affected body part  Outcome: Progressing  Goal: Return ADL status to a safe level of function  Outcome: Progressing     Problem: Skin/Tissue Integrity  Goal: Absence of new skin breakdown  Description: 1.  Monitor for areas of redness and/or skin breakdown  2.  Assess vascular access sites hourly  3.  Every 4-6 hours minimum:  Change oxygen saturation probe site  4.  Every 4-6 hours:  If on nasal continuous positive airway pressure, respiratory therapy assess nares and determine need for appliance change or resting period.  Outcome: Progressing     Problem: ABCDS Injury Assessment  Goal: Absence of physical injury  Outcome: Progressing     Problem: Safety - Medical Restraint  Goal: Remains free of injury from restraints (Restraint for Interference with Medical Device)  Description: INTERVENTIONS:  1. Determine that other, less restrictive measures have been tried or would not be effective before applying the restraint  2. Evaluate the patient's condition at the time of restraint application  3. Inform patient/family regarding the reason for restraint  4. Q2H: Monitor safety, psychosocial status, comfort, nutrition and hydration  Outcome: Progressing

## 2022-07-24 NOTE — Progress Notes (Signed)
Physical Therapy:  Patient remains OTF for HD, unavailable for PT re-evaluation s/p bilat TMA. Will defer and continue to follow. Confirmed bilat NWB status with Dr Phillingane's office, need Wbing order moving forward.     Rowene Suto Wynetta Emery PT, DPT

## 2022-07-25 LAB — POCT GLUCOSE
POC Glucose: 136 mg/dL — ABNORMAL HIGH (ref 65–117)
POC Glucose: 150 mg/dL — ABNORMAL HIGH (ref 65–117)
POC Glucose: 118 mg/dL — ABNORMAL HIGH (ref 65–117)
POC Glucose: 162 mg/dL — ABNORMAL HIGH (ref 65–117)
POC Glucose: 134 mg/dL — ABNORMAL HIGH (ref 65–117)

## 2022-07-25 MED ORDER — GLUCOSE 4 G PO CHEW
4 g | ORAL | Status: AC | PRN
Start: 2022-07-25 — End: 2022-08-03

## 2022-07-25 MED ORDER — DEXTROSE 10 % IV BOLUS
INTRAVENOUS | Status: AC | PRN
Start: 2022-07-25 — End: 2022-08-03

## 2022-07-25 MED ORDER — INSULIN LISPRO 100 UNIT/ML IJ SOLN
100 UNIT/ML | Freq: Four times a day (QID) | INTRAMUSCULAR | Status: AC
Start: 2022-07-25 — End: 2022-08-03

## 2022-07-25 MED ORDER — GLUCAGON (RDNA) 1 MG IJ KIT
1 MG | INTRAMUSCULAR | Status: AC | PRN
Start: 2022-07-25 — End: 2022-08-03

## 2022-07-25 MED ORDER — DEXTROSE 10 % IV SOLN
10 % | INTRAVENOUS | Status: AC | PRN
Start: 2022-07-25 — End: 2022-08-03

## 2022-07-25 NOTE — Progress Notes (Signed)
Hospitalist Progress Note    NAME:   Jaime [name]   DOB: 08-26-1946   MRN: [id number]     Date/Time: 07/25/2022 11:50 PM  Patient PCP: No primary care provider on file.    Estimated discharge date: Next week  Barriers: Final antibiotic, podiatry clearance    For reference :   76 y/m with prolonged hospitalization, was admitted to surgical service on 7/31 for perforated hollow viscus underwent emergent laparotomy/ septic shock d/t peritonitis, was intubated and extubated x 2. Also had AKI needing CVVH, now on HD. Also has bilateral foot gangrene. Was transferred to hospitalist service on 8/21.     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 Other option could be to go to an SNF in [place] ) .     9/13:on Omnicef p.o. due to lack of IV access.   plan to continue Omnicef p.o. x2 more weeks  ending 8 weeks on 9/ 29.  9/15: Patient stable   9/16: Medically stable   Awaiting placement   continue Omnicef p.o.until  on 9/ 29.  9/17  2 episodes of fever 100.4-relieved with Tylenol  No gross symptoms of infection  Examination unremarkable  Patient on on antibiotics-we will monitor for the fever  If persistent high-grade fever will start work-up  ID following  9/18  Remained afebrile overnight  No new leukocyte noted on CBC  Continue with antibiotic as per ID  9/19 and 9/20:  No acute  issues, continue with the same management    Antimicrobial orders for discharge  -Omnicef 300 mg p.o. daily end date 9/29  -- Pt will require repeat imaging of liver before conclusion of therapy  -Weekly CBC, CMP-  -Encourage adequate fluids, daily probiotic/yogurt  -ID follow-up - if pt remains in [place] call [phone]      #Acute hypoxemic respiratory failure:  Intubated 7/31, extubation 8/7  -Reintubated 8/13, extubated on 8/19  -Currently on 2 liters NC  -Weaned off o2      #Dysphagia:  -Ongoing SLP evaluation-patient tolerating small diet .   -Aspiration on FEES study  -Follow-up SLP evaluation     #Acute kidney injury:  -Now on HD  -Continue with HD  -Nephrology following  -Case management informed the need to set up HD at SNF prior to discharge .            #Diabetes mellitus:  -Sliding scale and lantus  -Monitor FS  -Diabetes team following     #Sacral wound:  -Wound care following    Pending placement        Medical Decision Making:   I personally reviewed labs:   I personally reviewed imaging:  I personally reviewed EKG:  Toxic drug monitoring:   Discussed case with: Patient , RN        Code Status: Full  DVT Prophylaxis: Heparin   GI Prophylaxis:    Subjective:     Chief Complaint / Reason for Physician Visit  "Follow up case. No active complaints. ".  Discussed with RN events overnight.       Objective:     VITALS:   Last 24hrs VS reviewed since prior progress note. Most recent are:  Patient Vitals for the past 24 hrs:   BP Temp Temp src Pulse Resp SpO2   07/25/22 1937 124/60 98 F (36.7 C) Oral 98 20 97 %   07/25/22 1527 123/71 97.6 F (36.4 C) Axillary 93 16 95 %   07/25/22 1137 (!) 99/53 97.9 F (36.6 C) Axillary 89 18 99 %   07/25/22 0835 (!) 149/70 -- -- 87 -- --   07/25/22 0720 (!) 146/92 97.8 F (36.6 C) Oral 78 18 100 %   07/25/22 0410 127/60 97.3 F (36.3 C) Oral 77 18 100 %           Intake/Output Summary (Last 24 hours) at 07/25/2022 2350  Last data filed at 07/25/2022 2154  Gross  per 24 hour   Intake 210 ml   Output 150 ml   Net 60 ml              I had a face to face encounter and independently examined this patient on 07/25/2022, as outlined below:  PHYSICAL EXAM:  General: Alert, cooperative  EENT:  EOMI. Anicteric sclerae..Rt. hd catn - jugular   Resp:  CTA bilaterally, no wheezing or rales.  No accessory muscle use  CV:  Regular  rhythm,  No edema  GI:  Soft, Non distended, Non tender.  +Bowel sounds, wound vac in place , drain on rt upper quadrant   Neurologic:  Alert and oriented X 3, normal speech,   Psych:   Good insight. Not anxious nor agitated  Foot :  bilateral feet - gangrene of most of the toes  , not yet full demarcation     Reviewed most current lab test results and cultures  YES  Reviewed most current radiology test results   YES  Review and summation of old records today    NO  Reviewed patient's current orders and MAR    YES  PMH/SH reviewed - no change compared to H&P  ________________________________________________________________________  Care Plan discussed with:    Comments   Patient x    Family      RN x    Care Manager     Consultant                        Multidiciplinary team rounds were held today with case manager, nursing, pharmacist and Occupational psychologist.  Patient's plan of care was discussed; medications were reviewed and discharge planning was addressed.     ________________________________________________________________________  Total NON critical care TIME:  25  Minutes    Total CRITICAL CARE TIME Spent:   Minutes non procedure based      Comments   >50% of visit spent in counseling and coordination of care     ________________________________________________________________________  Antionette Fairy, MD     Procedures: see electronic medical records for all procedures/Xrays and details which were not copied into this note but were reviewed prior to creation of Plan.      LABS:  I reviewed today's most current labs and imaging studies.  Pertinent labs  include:  Recent Labs     07/24/22  0049  WBC 12.1*   HGB 8.4*   HCT 27.0*   PLT 206         Recent Labs     07/23/22  1536 07/24/22  0049   NA  --  135*   K  --  4.6   CL  --  102   CO2  --  29   GLUCOSE  --  191*   BUN  --  26*   CREATININE 2.7* 3.12*   CALCIUM  --  8.3*           Signed: Terence Lux, MD

## 2022-07-25 NOTE — Progress Notes (Signed)
[facility]  [facility]        POD #2 s/p bilateral transmetatarsal amputations  Heel touch to B/L LE for transfers  Dressings may stay intact for 5 days. Will be betadine WTD every other day following  Abx per ID  Pt stable for DC or transfer from podiatry standpoint    Thank you!          Trevell Pariseau A. Lennyn Bellanca, DPM, CWSP, [facility]  [address] [place] [address]  O: [phone]  F: [phone]    [facility] (Opening Sept 2023)  [address] [place] [address]  O: [phone]  F: [phone]    [facility]  [address], [address], [place] [address]  O: [phone]  F: [phone]    * Available via [facility] 24/7

## 2022-07-25 NOTE — Progress Notes (Signed)
Chart reviewed. Acknowledged OT order for re-eval s/p bilat transmetatarsal amputations (WB as per podiatry: "Heel touch to B/L LE for transfers").    Attempted OT session however pt politely declined stating he is too tired. Pt agreeable to OT eval tomorrow. Pt stated he will continue UE strengthening exercises as provided to him by previous therapist using yellow theraband.     Defer today and reeval tomrorow as able.

## 2022-07-25 NOTE — Plan of Care (Signed)
Problem: Safety - Adult  Goal: Free from fall injury  Outcome: Progressing  Flowsheets (Taken 07/24/2022 2018 by Mattie Marlin, RN)  Free From Fall Injury: Instruct family/caregiver on patient safety     Problem: Respiratory - Adult  Goal: Achieves optimal ventilation and oxygenation  Outcome: Progressing  Flowsheets (Taken 07/24/2022 2018 by Mattie Marlin, RN)  Achieves optimal ventilation and oxygenation: Assess for changes in respiratory status     Problem: Pain  Goal: Verbalizes/displays adequate comfort level or baseline comfort level  Outcome: Progressing     Problem: Discharge Planning  Goal: Discharge to home or other facility with appropriate resources  Outcome: Progressing     Problem: Chronic Conditions and Co-morbidities  Goal: Patient's chronic conditions and co-morbidity symptoms are monitored and maintained or improved  Outcome: Progressing  Flowsheets (Taken 07/24/2022 2018 by Mattie Marlin, RN)  Care Plan - Patient's Chronic Conditions and Co-Morbidity Symptoms are Monitored and Maintained or Improved: Monitor and assess patient's chronic conditions and comorbid symptoms for stability, deterioration, or improvement     Problem: Neurosensory - Adult  Goal: Achieves maximal functionality and self care  Outcome: Progressing     Problem: Cardiovascular - Adult  Goal: Maintains optimal cardiac output and hemodynamic stability  Outcome: Progressing  Flowsheets (Taken 07/24/2022 2018 by Mattie Marlin, RN)  Maintains optimal cardiac output and hemodynamic stability: Monitor blood pressure and heart rate  Goal: Absence of cardiac dysrhythmias or at baseline  Outcome: Progressing  Flowsheets (Taken 07/24/2022 2018 by Mattie Marlin, RN)  Absence of cardiac dysrhythmias or at baseline: Monitor cardiac rate and rhythm     Problem: Gastrointestinal - Adult  Goal: Maintains or returns to baseline bowel function  Outcome: Progressing  Flowsheets (Taken 07/24/2022 2018 by Mattie Marlin, RN)  Maintains or returns to baseline bowel function:  Assess bowel function  Goal: Maintains adequate nutritional intake  Outcome: Progressing  Flowsheets (Taken 07/24/2022 2018 by Mattie Marlin, RN)  Maintains adequate nutritional intake: Monitor percentage of each meal consumed     Problem: Genitourinary - Adult  Goal: Absence of urinary retention  Outcome: Progressing  Flowsheets (Taken 07/24/2022 2018 by Mattie Marlin, RN)  Absence of urinary retention: Assess patient's ability to void and empty bladder     Problem: Metabolic/Fluid and Electrolytes - Adult  Goal: Electrolytes maintained within normal limits  Outcome: Progressing  Flowsheets (Taken 07/24/2022 2018 by Mattie Marlin, RN)  Electrolytes maintained within normal limits: Monitor labs and assess patient for signs and symptoms of electrolyte imbalances  Goal: Hemodynamic stability and optimal renal function maintained  Outcome: Progressing  Flowsheets (Taken 07/24/2022 2018 by Mattie Marlin, RN)  Hemodynamic stability and optimal renal function maintained: Monitor labs and assess for signs and symptoms of volume excess or deficit  Goal: Glucose maintained within prescribed range  Outcome: Progressing  Flowsheets (Taken 07/24/2022 2018 by Mattie Marlin, RN)  Glucose maintained within prescribed range: Monitor blood glucose as ordered     Problem: Skin/Tissue Integrity - Adult  Goal: Skin integrity remains intact  Outcome: Progressing  Flowsheets (Taken 07/24/2022 2018 by Mattie Marlin, RN)  Skin Integrity Remains Intact: Monitor for areas of redness and/or skin breakdown  Goal: Incisions, wounds, or drain sites healing without S/S of infection  Outcome: Progressing  Flowsheets (Taken 07/24/2022 2018 by Mattie Marlin, RN)  Incisions, Wounds, or Drain Sites Healing Without Sign and Symptoms of Infection:   ADMISSION and DAILY: Assess and document risk factors for pressure ulcer development   TWICE DAILY: Assess and document skin integrity  Goal: Oral mucous membranes remain intact  Outcome: Progressing     Problem: Hematologic - Adult  Goal:  Maintains hematologic stability  Outcome: Progressing     Problem: Musculoskeletal - Adult  Goal: Return mobility to safest level of function  Outcome: Progressing  Flowsheets (Taken 07/24/2022 2018 by Mattie Marlin, RN)  Return Mobility to Safest Level of Function:   Assess patient stability and activity tolerance for standing, transferring and ambulating with or without assistive devices   Assist with transfers and ambulation using safe patient handling equipment as needed  Goal: Maintain proper alignment of affected body part  Outcome: Progressing  Goal: Return ADL status to a safe level of function  Outcome: Progressing     Problem: Skin/Tissue Integrity  Goal: Absence of new skin breakdown  Description: 1.  Monitor for areas of redness and/or skin breakdown  2.  Assess vascular access sites hourly  3.  Every 4-6 hours minimum:  Change oxygen saturation probe site  4.  Every 4-6 hours:  If on nasal continuous positive airway pressure, respiratory therapy assess nares and determine need for appliance change or resting period.  Outcome: Progressing     Problem: ABCDS Injury Assessment  Goal: Absence of physical injury  Outcome: Progressing  Flowsheets (Taken 07/24/2022 2018 by Mattie Marlin, RN)  Absence of Physical Injury: Implement safety measures based on patient assessment     Problem: Safety - Medical Restraint  Goal: Remains free of injury from restraints (Restraint for Interference with Medical Device)  Description: INTERVENTIONS:  1. Determine that other, less restrictive measures have been tried or would not be effective before applying the restraint  2. Evaluate the patient's condition at the time of restraint application  3. Inform patient/family regarding the reason for restraint  4. Q2H: Monitor safety, psychosocial status, comfort, nutrition and hydration  Outcome: Progressing

## 2022-07-25 NOTE — Progress Notes (Addendum)
0700 Bedside and Verbal shift change report given to [place] (oncoming nurse) by Su Grand (offgoing nurse). Report included the following information Nurse Handoff Report, Index, ED Encounter Summary, ED SBAR, Intake/Output, MAR, Recent Results, and Cardiac Rhythm NSR .     Braden Score 14. All of the following interventions have been implemented to prevent pressure injury:    SKIN ASSESSMENT (S)  Dual skin assessment completed at shift change: Yes  Name of second RN who completed Dual Skin Assessment: John RN  Picture of wound uploaded to EMR: Yes  Venelex ordered and given per protocol: Yes  Wound care consulted if wounds present: Yes    SURFACE (S)  Stryker air pump or specialty bed ordered: Yes  Type of bed: iso bed  Only white chux used with specialty surfaces (no green chux used): Yes  Waffle cushion used for chair positioning: N/A    KEEP MOVING (K)  Mobility status (Bedrest, Chairbound, UWA x 1 assist , 2 assist, Max assist): x2 assist  Q2 hour turns documented: Yes  Refusals to turn and education provided documented: NA  Device used to float heels: heel pillows  PT/OT consulted: Yes    INCONTINENCE (I)  Incontinence status assessed Q2 hours: Yes  External catheter in use: NO  Barrier cream in use: YES    NUTRITION (N)  I/O's documented every 8 hours: Yes  Oral supplements ordered if appropriate: Yes  Nutrition services consulted: Yes    All concerns about new DTI's must be escalated directly to attending MD, charge nurse, [place] and nurse director.     End of Shift Note    Bedside shift change report given to [place] (oncoming nurse) by Cherly Anderson, RN (offgoing nurse).  Report included the following information SBAR, Kardex, ED Summary, Intake/Output, MAR, Recent Results, and Cardiac Rhythm NSR    Shift worked:  7a to 7p     Shift summary and any significant changes:     See above         Concerns for physician to address:        Zone phone for oncoming shift:           Activity:     Number times ambulated  in hallways past shift: 0  Number of times OOB to chair past shift: 0    Cardiac:   Cardiac Monitoring: Yes           Access:  Current line(s): central line and HD access     Genitourinary:   Urinary status: incontinent and oliguric    Respiratory:      Chronic home O2 use?: NO  Incentive spirometer at bedside: NO       GI:     Current diet:  ADULT DIET; Regular; 5 carb choices (75 gm/meal)  ADULT ORAL NUTRITION SUPPLEMENT; Breakfast, Dinner; Renal Oral Supplement  Passing flatus: YES  Tolerating current diet: YES       Pain Management:   Patient states pain is manageable on current regimen: YES    Skin:     Interventions: specialty bed, float heels, increase time out of bed, foam dressing, PT/OT consult, limit briefs, internal/external urinary devices, and nutritional support    Patient Safety:  Fall Score:    Interventions: bed/chair alarm, assistive device (walker, cane. etc), gripper socks, pt to call before getting OOB, stay with me (per policy), and gait belt       Length of Stay:  Expected LOS: 58  Actual  LOS: [place], RN

## 2022-07-26 LAB — POCT GLUCOSE
POC Glucose: 150 mg/dL — ABNORMAL HIGH (ref 65–117)
POC Glucose: 155 mg/dL — ABNORMAL HIGH (ref 65–117)
POC Glucose: 143 mg/dL — ABNORMAL HIGH (ref 65–117)
POC Glucose: 110 mg/dL (ref 65–117)

## 2022-07-26 NOTE — Progress Notes (Signed)
Physical Therapy     Chart reviewed up to date. Attempted to see pt for PT session today. Pt OTF for HD and unavailable. Will defer and follow up tomorrow.    Darene Lamer PT, DPT, NCS

## 2022-07-26 NOTE — Progress Notes (Addendum)
0700: Bedside and Verbal shift change report given to Raquel Sarna, Therapist, sports (oncoming nurse) by Williemae Natter, RN (offgoing nurse). Report included the following information Nurse Handoff Report, Index, Intake/Output, MAR, and Recent Results.      1540: wound care preformed.     Patient turned and repositioned q2 throughout shift.     Braden Score 12. All of the following interventions have been implemented to prevent pressure injury:    SKIN ASSESSMENT (S)  Dual skin assessment completed at shift change: Yes  Name of second RN who completed Dual Skin Assessment: [place], RN  Picture of wound uploaded to EMR: Yes  Venelex ordered and given per protocol: Yes  Wound care consulted if wounds present: Yes    SURFACE (S)  Stryker air pump or specialty bed ordered: Yes  Type of bed: air bed  Only white chux used with specialty surfaces (no green chux used): Yes  Waffle cushion used for chair positioning: Yes    KEEP MOVING (K)  Mobility status (Bedrest, Chairbound, UWA x 1 assist , 2 assist, Max assist): bedrest  Q2 hour turns documented: Yes  Refusals to turn and education provided documented: NA  Device used to float heels: pillows and heels lift   PT/OT consulted: Yes    INCONTINENCE (I)  Incontinence status assessed Q2 hours: Yes  External catheter in use: oliguric   Barrier cream in use: zinc    NUTRITION (N)  I/O's documented every 8 hours: Yes  Oral supplements ordered if appropriate: Yes  Nutrition services consulted: Yes    All concerns about new DTI's must be escalated directly to attending MD, charge nurse, [place] and nurse director.     End of Shift Note    Bedside shift change report given to RN (oncoming nurse) by Atlee Abide, RN (offgoing nurse).  Report included the following information SBAR, Kardex, Intake/Output, MAR, and Recent Results    Shift worked:  0700-1900     Shift summary and any significant changes:     See above     Concerns for physician to address:  N/a     Zone phone for oncoming shift:          Activity:      Number times ambulated in hallways past shift: 0  Number of times OOB to chair past shift: 0    Cardiac:   Cardiac Monitoring: Yes           Access:  Current line(s): central line and HD access     Genitourinary:   Urinary status: voiding and oliguric    Respiratory:      Chronic home O2 use?: NO  Incentive spirometer at bedside: N/A       GI:     Current diet:  ADULT ORAL NUTRITION SUPPLEMENT; Breakfast, Dinner; Renal Oral Supplement  ADULT DIET; Regular; 5 carb choices (75 gm/meal); NO iced tea  Passing flatus: YES  Tolerating current diet: YES       Pain Management:   Patient states pain is manageable on current regimen: YES    Skin:     Interventions: turn team, specialty bed, float heels, foam dressing, PT/OT consult, limit briefs, and nutritional support    Patient Safety:  Fall Score:    Interventions: bed/chair alarm, assistive device (walker, cane. etc), gripper socks, pt to call before getting OOB, stay with me (per policy), and sitter at bedside       Length of Stay:  Expected LOS: 58  Actual LOS: 55  Atlee Abide, RN

## 2022-07-26 NOTE — Progress Notes (Incomplete)
Hospitalist Progress Note    NAME:   Jaime [name]   DOB: [birth year]   MRN: [id number]     Date/Time: 07/26/2022 10:10 AM  Patient PCP: No primary care provider on file.    Estimated discharge date: Next week  Barriers: Final antibiotic,  For reference :   76 y/m with prolonged hospitalization, was admitted to surgical service on 7/31 for perforated hollow viscus underwent emergent laparotomy/ septic shock d/t peritonitis, was intubated and extubated x 2. Also had AKI needing CVVH, now on HD. Also has bilateral foot gangrene. Was transferred to hospitalist service on 8/21.     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 Other option could be to go to an SNF in [facility] ) .     9/13:on Omnicef p.o. due to lack of IV access.   plan to continue Omnicef p.o. x2 more weeks  ending 8 weeks on 9/ 29.  9/15: Patient stable   9/16: Medically stable   Awaiting placement   continue Omnicef p.o.until  on 9/ 29.  9/17  2 episodes of fever 100.4-relieved with Tylenol  No gross symptoms of infection  Examination unremarkable  Patient on on antibiotics-we will monitor for the fever  If persistent high-grade fever will start work-up  ID following  9/18  Remained afebrile overnight  No new leukocyte noted on CBC  Continue with  antibiotic as per ID  9/19 and 9/20:  No acute issues, continue with the same management  09/23 patient on Rocephin we will follow-up with ID regarding final antibiotic     #Acute hypoxemic respiratory failure:  Intubated 7/31, extubation 8/7  -Reintubated 8/13, extubated on 8/19  -Currently on 2 liters [place]  -Weaned off o2      #Dysphagia:  -Ongoing SLP evaluation-patient tolerating small diet .   -Aspiration on FEES study  -Follow-up SLP evaluation     #Acute kidney injury:  -Now on HD  -Continue with HD  -Nephrology following  -Case management informed the need to set up HD at SNF prior to discharge .            #Diabetes mellitus:  -Sliding scale and lantus  -Monitor FS  -Diabetes team following     #Sacral wound:  -Wound care following    Pending placement        Medical Decision Making:   I personally reviewed labs:   I personally reviewed imaging:  I personally reviewed EKG:  Toxic drug monitoring:   Discussed case with: Patient , RN        Code Status: Full   DVT Prophylaxis: Heparin   GI Prophylaxis:    Subjective:     Chief Complaint / Reason for Physician Visit  "Follow up case. No active complaints. ".  Discussed with RN events overnight.       Objective:     VITALS:   Last 24hrs VS reviewed since prior progress note. Most recent are:  Patient Vitals for the past 24 hrs:   BP Temp Temp src Pulse Resp SpO2   07/26/22 0911 (!) 146/62 -- -- 85 -- --   07/26/22 0735 125/62 97.6 F (36.4 C) Oral 85 18 98 %   07/26/22 0314 -- 97.9 F (36.6 C) Oral -- -- --   07/25/22 2247 (!) 125/57 98.1 F (36.7 C) Oral 97 18 96 %   07/25/22 1937 124/60 98 F (36.7 C) Oral 98 20 97 %   07/25/22 1527 123/71 97.6 F (36.4 C) Axillary 93 16 95 %   07/25/22 1137 (!) 99/53 97.9 F (36.6 C) Axillary 89 18 99 %           Intake/Output Summary (Last 24 hours) at 07/26/2022 1010  Last data filed at 07/26/2022 7425  Gross per 24 hour   Intake 70 ml   Output 170 ml   Net -100 ml              I had a face to face encounter and  independently examined this patient on 07/26/2022, as outlined below:  PHYSICAL EXAM:  General: Alert, cooperative  EENT:  EOMI. Anicteric sclerae..Rt. hd catn - jugular   Resp:  CTA bilaterally, no wheezing or rales.  No accessory muscle use  CV:  Regular  rhythm,  No edema  GI:  Soft, Non distended, Non tender.  +Bowel sounds, wound vac in place , drain on rt upper quadrant   Neurologic:  Alert and oriented X 3, normal speech,   Psych:   Good insight. Not anxious nor agitated  Foot :  bilateral feet - gangrene of most of the toes  , not yet full demarcation     Reviewed most current lab test results and cultures  YES  Reviewed most current radiology test results   YES  Review and summation of old records today    NO  Reviewed patient's current orders and MAR    YES  PMH/SH reviewed - no change compared to H&P  ________________________________________________________________________  Care Plan discussed with:    Comments   Patient x    Family      RN x    Care Manager     Consultant                        Multidiciplinary team rounds were held today with case manager, nursing, pharmacist and Higher education careers adviser.  Patient's plan of care was discussed; medications were reviewed and discharge planning was addressed.     ________________________________________________________________________  Total NON critical care TIME:  25  Minutes    Total CRITICAL CARE TIME Spent:   Minutes non procedure based      Comments   >50% of visit spent in counseling and coordination of care     ________________________________________________________________________  Terence Lux, MD     Procedures: see electronic medical records for all procedures/Xrays and details which were not copied into this note but were reviewed prior to creation of Plan.      LABS:  I reviewed today's most current labs and imaging studies.  Pertinent labs include:  Recent Labs     07/24/22  0049   WBC 12.1*   HGB 8.4*   HCT 27.0*   PLT 206  Recent Labs      07/23/22  1536 07/24/22  0049   NA  --  135*   K  --  4.6   CL  --  102   CO2  --  29   GLUCOSE  --  191*   BUN  --  26*   CREATININE 2.7* 3.12*   CALCIUM  --  8.3*           Signed: Terence Lux, MD

## 2022-07-26 NOTE — Progress Notes (Incomplete)
Hospitalist Progress Note    NAME:   Jaime [name]   DOB: [birth year]   MRN: [id number]     Date/Time: 07/26/2022 9:47 AM  Patient PCP: No primary care provider on file.    Estimated discharge date: Next week  Barriers: Final antibiotic    For reference :   76 y/m with prolonged hospitalization, was admitted to surgical service on 7/31 for perforated hollow viscus underwent emergent laparotomy/ septic shock d/t peritonitis, was intubated and extubated x 2. Also had AKI needing CVVH, now on HD. Also has bilateral foot gangrene. Was transferred to hospitalist service on 8/21.     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 Other option could be to go to an SNF in [place] [facility] ) .     9/13:on Omnicef p.o. due to lack of IV access.   plan to continue Omnicef p.o. x2 more weeks  ending 8 weeks on 9/ 29.  9/15: Patient stable   9/16: Medically stable   Awaiting placement   continue Omnicef p.o.until  on 9/ 29.  9/17  2 episodes of fever 100.4-relieved with Tylenol  No gross symptoms of infection  Examination unremarkable  Patient on on antibiotics-we will monitor for the fever  If persistent high-grade fever will start work-up  ID following  9/18  Remained afebrile overnight  No new leukocyte noted on CBC  Continue with antibiotic as per ID  9/19 and 9/20:  No acute issues, continue with  the same management         #Acute hypoxemic respiratory failure:  Intubated 7/31, extubation 8/7  -Reintubated 8/13, extubated on 8/19  -Currently on 2 liters [place]  -Weaned off o2      #Dysphagia:  -Ongoing SLP evaluation-patient tolerating small diet .   -Aspiration on FEES study  -Follow-up SLP evaluation     #Acute kidney injury:  -Now on HD  -Continue with HD  -Nephrology following  -Case management informed the need to set up HD at SNF prior to discharge .            #Diabetes mellitus:  -Sliding scale and lantus  -Monitor FS  -Diabetes team following     #Sacral wound:  -Wound care following    Pending placement        Medical Decision Making:   I personally reviewed labs:   I personally reviewed imaging:  I personally reviewed EKG:  Toxic drug monitoring:   Discussed case with: Patient , RN        Code Status: Full   DVT Prophylaxis: Heparin   GI Prophylaxis:    Subjective:     Chief Complaint / Reason for Physician Visit  "Follow up case. No active complaints. ".  Discussed with RN events overnight.       Objective:     VITALS:  Last 24hrs VS reviewed since prior progress note. Most recent are:  Patient Vitals for the past 24 hrs:   BP Temp Temp src Pulse Resp SpO2   07/26/22 0911 (!) 146/62 -- -- 85 -- --   07/26/22 0735 125/62 97.6 F (36.4 C) Oral 85 18 98 %   07/26/22 0314 -- 97.9 F (36.6 C) Oral -- -- --   07/25/22 2247 (!) 125/57 98.1 F (36.7 C) Oral 97 18 96 %   07/25/22 1937 124/60 98 F (36.7 C) Oral 98 20 97 %   07/25/22 1527 123/71 97.6 F (36.4 C) Axillary 93 16 95 %   07/25/22 1137 (!) 99/53 97.9 F (36.6 C) Axillary 89 18 99 %           Intake/Output Summary (Last 24 hours) at 07/26/2022 0947  Last data filed at 07/26/2022 6440  Gross per 24 hour   Intake 70 ml   Output 170 ml   Net -100 ml              I had a face to face encounter and independently examined this patient on 07/26/2022, as outlined below:  PHYSICAL EXAM:  General: Alert, cooperative  EENT:  EOMI. Anicteric sclerae..Rt. hd  catn - jugular   Resp:  CTA bilaterally, no wheezing or rales.  No accessory muscle use  CV:  Regular  rhythm,  No edema  GI:  Soft, Non distended, Non tender.  +Bowel sounds, wound vac in place , drain on rt upper quadrant   Neurologic:  Alert and oriented X 3, normal speech,   Psych:   Good insight. Not anxious nor agitated  Foot :  bilateral feet - gangrene of most of the toes  , not yet full demarcation     Reviewed most current lab test results and cultures  YES  Reviewed most current radiology test results   YES  Review and summation of old records today    NO  Reviewed patient's current orders and MAR    YES  PMH/SH reviewed - no change compared to H&P  ________________________________________________________________________  Care Plan discussed with:    Comments   Patient x    Family      RN x    Care Manager     Consultant                        Multidiciplinary team rounds were held today with case manager, nursing, pharmacist and Occupational psychologist.  Patient's plan of care was discussed; medications were reviewed and discharge planning was addressed.     ________________________________________________________________________  Total NON critical care TIME:  25  Minutes    Total CRITICAL CARE TIME Spent:   Minutes non procedure based      Comments   >50% of visit spent in counseling and coordination of care     ________________________________________________________________________  Antionette Fairy, MD     Procedures: see electronic medical records for all procedures/Xrays and details which were not copied into this note but were reviewed prior to creation of Plan.      LABS:  I reviewed today's most current labs and imaging studies.  Pertinent labs include:  Recent Labs     07/24/22  0049   WBC 12.1*   HGB 8.4*   HCT 27.0*   PLT 206         Recent Labs     07/23/22  1536 07/24/22  0049  NA  --  135*   K  --  4.6   CL  --  102   CO2  --  29   GLUCOSE  --  191*   BUN  --  26*   CREATININE 2.7* 3.12*    CALCIUM  --  8.3*           Signed: Terence Lux, MD

## 2022-07-26 NOTE — Plan of Care (Signed)
Problem: Safety - Adult  Goal: Free from fall injury  Outcome: Progressing     Problem: Respiratory - Adult  Goal: Achieves optimal ventilation and oxygenation  Outcome: Progressing     Problem: Pain  Goal: Verbalizes/displays adequate comfort level or baseline comfort level  Outcome: Progressing     Problem: Safety - Medical Restraint  Goal: Remains free of injury from restraints (Restraint for Interference with Medical Device)  Description: INTERVENTIONS:  1. Determine that other, less restrictive measures have been tried or would not be effective before applying the restraint  2. Evaluate the patient's condition at the time of restraint application  3. Inform patient/family regarding the reason for restraint  4. Q2H: Monitor safety, psychosocial status, comfort, nutrition and hydration  Outcome: Progressing

## 2022-07-26 NOTE — Progress Notes (Signed)
Attempted to see pt for OT services.  Pt is currently off the floor for dialysis.  Noted that WB bilateral TMAs on 9/23 with clarification of heel WB bilaterally.  Will defer but continue to follow.

## 2022-07-27 LAB — CBC
Hematocrit: 24.9 % — ABNORMAL LOW (ref 36.6–50.3)
Hemoglobin: 7.8 g/dL — ABNORMAL LOW (ref 12.1–17.0)
MCH: 31.7 PG (ref 26.0–34.0)
MCHC: 31.3 g/dL (ref 30.0–36.5)
MCV: 101.2 FL — ABNORMAL HIGH (ref 80.0–99.0)
MPV: 10.2 FL (ref 8.9–12.9)
Nucleated RBCs: 0 PER 100 WBC
Platelets: 186 10*3/uL (ref 150–400)
RBC: 2.46 M/uL — ABNORMAL LOW (ref 4.10–5.70)
RDW: 15.4 % — ABNORMAL HIGH (ref 11.5–14.5)
WBC: 12.2 10*3/uL — ABNORMAL HIGH (ref 4.1–11.1)
nRBC: 0 10*3/uL (ref 0.00–0.01)

## 2022-07-27 LAB — POCT GLUCOSE
POC Glucose: 164 mg/dL — ABNORMAL HIGH (ref 65–117)
POC Glucose: 137 mg/dL — ABNORMAL HIGH (ref 65–117)
POC Glucose: 112 mg/dL (ref 65–117)
POC Glucose: 123 mg/dL — ABNORMAL HIGH (ref 65–117)

## 2022-07-27 LAB — BASIC METABOLIC PANEL
Anion Gap: 4 mmol/L — ABNORMAL LOW (ref 5–15)
BUN: 36 MG/DL — ABNORMAL HIGH (ref 6–20)
Bun/Cre Ratio: 10 — ABNORMAL LOW (ref 12–20)
CO2: 30 mmol/L (ref 21–32)
Calcium: 8.4 MG/DL — ABNORMAL LOW (ref 8.5–10.1)
Chloride: 104 mmol/L (ref 97–108)
Creatinine: 3.66 MG/DL — ABNORMAL HIGH (ref 0.70–1.30)
Est, Glom Filt Rate: 17 mL/min/{1.73_m2} — ABNORMAL LOW (ref >60)
Glucose: 126 mg/dL — ABNORMAL HIGH (ref 65–100)
Potassium: 3.9 mmol/L (ref 3.5–5.1)
Sodium: 138 mmol/L (ref 136–145)

## 2022-07-27 MED ORDER — HEPARIN SODIUM (PORCINE) 1000 UNIT/ML IJ SOLN
1000 UNIT/ML | INTRAMUSCULAR | Status: AC
Start: 2022-07-27 — End: 2022-07-28

## 2022-07-27 MED FILL — SEVELAMER CARBONATE 2.4 G PO PACK: 2.4 g | ORAL | Qty: 2.4

## 2022-07-27 MED FILL — THERA M PLUS PO TABS: ORAL | Qty: 1

## 2022-07-27 MED FILL — HEPARIN SODIUM (PORCINE) 5000 UNIT/ML IJ SOLN: 5000 UNIT/ML | INTRAMUSCULAR | Qty: 1

## 2022-07-27 MED FILL — RETACRIT 3000 UNIT/ML IJ SOLN: 3000 UNIT/ML | INTRAMUSCULAR | Qty: 2

## 2022-07-27 MED FILL — HEPARIN SODIUM (PORCINE) 1000 UNIT/ML IJ SOLN: 1000 UNIT/ML | INTRAMUSCULAR | Qty: 10

## 2022-07-27 MED FILL — RENVELA 2.4 G PO PACK: 2.4 g | ORAL | Qty: 2.4

## 2022-07-27 MED FILL — CEFTRIAXONE SODIUM 2 G IJ SOLR: 2 g | INTRAMUSCULAR | Qty: 2000

## 2022-07-27 MED FILL — AMIODARONE HCL 200 MG PO TABS: 200 MG | ORAL | Qty: 1

## 2022-07-27 MED FILL — RISAQUAD-2 PO CAPS: ORAL | Qty: 1

## 2022-07-27 MED FILL — OXYCODONE HCL 5 MG PO TABS: 5 MG | ORAL | Qty: 1

## 2022-07-27 MED FILL — BISACODYL 10 MG RE SUPP: 10 MG | RECTAL | Qty: 1

## 2022-07-27 NOTE — Progress Notes (Signed)
Hospitalist Progress Note    NAME:   Jaime [name]   DOB: 06-18-1946   MRN: [id number]     Date/Time: 07/27/2022 10:05 PM  Patient PCP: No primary care provider on file.    Estimated discharge date: Next week  Barriers: Final antibiotic,  For reference :   76 y/m with prolonged hospitalization, was admitted to surgical service on 7/31 for perforated hollow viscus underwent emergent laparotomy/ septic shock d/t peritonitis, was intubated and extubated x 2. Also had AKI needing CVVH, now on HD. Also has bilateral foot gangrene. Was transferred to hospitalist service on 8/21.     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 Other option could be to go to an SNF in [place] [facility] ) .     9/13:on Omnicef p.o. due to lack of IV access.   plan to continue Omnicef p.o. x2 more weeks  ending 8 weeks on 9/ 29.  9/15: Patient stable   9/16: Medically stable   Awaiting placement   continue Omnicef p.o.until  on 9/ 29.  9/17  2 episodes of fever 100.4-relieved with Tylenol  No gross symptoms of infection  Examination unremarkable  Patient on on antibiotics-we will monitor for the fever  If persistent high-grade fever will start work-up  ID following  9/18  Remained afebrile overnight  No new leukocyte noted on CBC  Continue with  antibiotic as per ID  9/19 and 9/20:  No acute issues, continue with the same management  09/23 patient on Rocephin we will follow-up with ID regarding final antibiotic     #Acute hypoxemic respiratory failure:  Intubated 7/31, extubation 8/7  -Reintubated 8/13, extubated on 8/19  -Currently on 2 liters [place]  -Weaned off o2      #Dysphagia:  -Ongoing SLP evaluation-patient tolerating small diet .   -Aspiration on FEES study  -Follow-up SLP evaluation     #Acute kidney injury:  -Now on HD  -Continue with HD  -Nephrology following  -Case management informed the need to set up HD at SNF prior to discharge .            #Diabetes mellitus:  -Sliding scale and lantus  -Monitor FS  -Diabetes team following     #Sacral wound:  -Wound care following    Pending placement        Medical Decision Making:   I personally reviewed labs:   I personally reviewed imaging:  I personally reviewed EKG:  Toxic drug monitoring:   Discussed case with: Patient , RN        Code Status: Full   DVT Prophylaxis: Heparin   GI Prophylaxis:    Subjective:     Chief Complaint / Reason for Physician Visit  "Follow up case. No active complaints. ".  Discussed with RN events overnight.       Objective:     VITALS:   Last 24hrs VS reviewed since prior progress note. Most recent are:  Patient Vitals for the past 24 hrs:   BP Temp Temp src Pulse Resp SpO2   07/27/22 2032 135/66 97.6 F (36.4 C) Oral 96 -- 96 %   07/27/22 1804 -- -- -- -- 18 --   07/27/22 1408 130/65 97.9 F (36.6 C) Oral 88 18 97 %   07/27/22 1349 130/72 98 F (36.7 C) -- 94 16 --   07/27/22 1345 -- -- -- 88 -- --   07/27/22 1330 119/67 -- -- 90 -- --   07/27/22 1315 132/73 -- -- 88 -- --   07/27/22 1300 125/76 -- -- 87 -- --   07/27/22 1245 129/76 -- -- 89 -- --   07/27/22 1230 122/77 -- -- 87 -- --   07/27/22 1215 128/74 -- -- 90 -- --   07/27/22 1200 129/75 -- -- 87 -- --   07/27/22 1145 139/79 -- -- 88 -- --   07/27/22 1130 121/66 -- -- 90 -- --   07/27/22 1115 121/66 -- -- 90 -- --    07/27/22 1100 123/68 -- -- 92 -- --   07/27/22 1049 (!) 143/71 98.2 F (36.8 C) -- 86 17 --   07/27/22 0739 (!) 148/68 98.2 F (36.8 C) Axillary 79 18 96 %   07/27/22 0330 135/63 97.9 F (36.6 C) Oral 86 19 --   07/26/22 2330 128/63 98.9 F (37.2 C) -- 89 17 --           Intake/Output Summary (Last 24 hours) at 07/27/2022 2205  Last data filed at 07/27/2022 1804  Gross per 24 hour   Intake 1100 ml   Output 1545 ml   Net -445 ml              I had a face to face encounter and independently examined this patient on 07/27/2022, as outlined below:  PHYSICAL EXAM:  General: Alert, cooperative  EENT:  EOMI. Anicteric sclerae..Rt. hd catn - jugular   Resp:  CTA bilaterally, no wheezing or rales.  No accessory muscle use  CV:  Regular  rhythm,  No edema  GI:  Soft, Non distended, Non tender.  +Bowel sounds, wound vac in place , drain on rt upper quadrant   Neurologic:  Alert and oriented X 3, normal speech,   Psych:   Good insight. Not anxious nor agitated  Foot :  bilateral feet - gangrene of most of the toes  , not yet full demarcation     Reviewed most current lab test results and cultures  YES  Reviewed most current radiology test results   YES  Review and summation of old records today    NO  Reviewed patient's current orders and MAR    YES  PMH/SH reviewed - no change compared to H&P  ________________________________________________________________________  Care Plan discussed with:    Comments   Patient x    Family      RN x    Care Manager     Consultant                        Multidiciplinary team rounds were held today with case manager, nursing, pharmacist and Occupational psychologist.  Patient's plan of care was discussed; medications were reviewed and discharge planning was addressed.  ________________________________________________________________________  Total NON critical care TIME:  25  Minutes    Total CRITICAL CARE TIME Spent:   Minutes non procedure based      Comments   >50% of visit spent in  counseling and coordination of care     ________________________________________________________________________  Terence Lux, MD     Procedures: see electronic medical records for all procedures/Xrays and details which were not copied into this note but were reviewed prior to creation of Plan.      LABS:  I reviewed today's most current labs and imaging studies.  Pertinent labs include:  Recent Labs     07/27/22  0101   WBC 12.2*   HGB 7.8*   HCT 24.9*   PLT 186         Recent Labs     07/27/22  0101   NA 138   K 3.9   CL 104   CO2 30   GLUCOSE 126*   BUN 36*   CREATININE 3.66*   CALCIUM 8.4*           Signed: Terence Lux, MD

## 2022-07-27 NOTE — Other (Signed)
Pre-dialysis   07/27/22 1049   Treatment   Time On 1049   Treatment Goal 0-1kg in 3hr   Observations & Evaluations   Level of Consciousness 0   Oriented X 4   Heart Rhythm Regular   Respiratory Quality/Effort Unlabored   O2 Device None (Room air)   Bilateral Breath Sounds Clear   Skin Color Pale   Skin Condition/Temp Dry;Warm   Edema Generalized Trace   RUE Edema Trace   LUE Edema Trace   Vital Signs   BP (!) 143/71   Temp 98.2 F (36.8 C)   Pulse 86   Respirations 17   Pain Assessment   Pain Assessment None - Denies Pain   Technical Checks   Dialysis Machine No. 09   RO Machine Number [id number]   Dialyzer Lot No. [id number]   Tubing Lot Number [id number]   All Connections Secure Yes   NS Bag Yes   Saline Line Double Clamped Yes   Dialyzer Revaclear 300   Prime Volume (mL) 200 mL   ICEBOAT I;C;E;B;O;A;T   RO Machine Log Sheet Completed Yes   Machine Alarm Self Test Completed;Passed   Materials engineer Function   Extracorporeal Advertising copywriter Conductivity 13.8   Manual Conductivity 13.8   Machine Ph 7.4   Bleach Test (Neg) Yes   Bath Temperature 96.8 F (36 C)   Tunneled Hemodialysis Catheter Right Subclavian   Placement Date: 07/01/22   Present on Admission/Arrival: No  Inserted by: Hope Riddell-NP  Insertion Practices: Chlorohexadine skin antisepsis;Maximal barrier precautions;Sterile ultrasound technique;Optimal catheter site selection;Hand hygiene  Orien...   Access Status  Accessed   Continued need for line? Yes   Site Assessment Clean, dry & intact   Venous Lumen Status Brisk blood return;Flushed;Infusing   Arterial Lumen Status Brisk blood return;Flushed;Infusing   Alcohol Cap Used Yes   Line Care Connections checked and tightened;Ports disinfected   Dressing Type Bacteriocidal;Transparent   Date of Last Dressing Change 07/24/22   Dressing Status Clean, dry & intact   Dressing Change Due 07/28/22   Treatment Initiation   Dialyze Hours 3   Treatment  Initiation Universal  Precautions maintained;Lines secured to patient;Connections secured;Prime given;Venous Parameters set;Arterial Parameters set;Chiropodist engaged;Saline line double clamped   During Hemodialysis Assessment   Blood Flow Rate (ml/min) 400 ml/min   Arterial Pressure (mmHg) -150 mmHg   Venous Pressure (mmHg) 120   TMP 70   DFR 600   Comments Treatment initiated   Access Visible Yes   Ultrafiltration Rate (ml/hr) 490 ml/hr   Ultrafiltration Removed (mL) 0 ml/min   Dialysis Bath   K+ (Potassium) 3   Ca+ (Calcium) 2.5   Na+ (Sodium) 140   HCO3 (Bicarb) 35   Primary RN SBAR: Nance Pew, RN  Patient Education: procedural  Hepatitis B Surface Ag   Date/Time Value Ref Range Status   07/19/2022 06:10 PM <0.10 Index Final     Hep B S Ab   Date/Time Value Ref Range Status   06/29/2022 09:53 AM <3.10 mIU/mL Final        Post dialysis     07/27/22 1349   Treatment   Time Off 1349   Treatment Goal 1kg in 3hr   Observations & Evaluations   Level of Consciousness 0   Oriented X 4   Heart Rhythm Regular   Respiratory Quality/Effort Unlabored   O2 Device None (Room air)   Bilateral Breath Sounds Clear  Skin Color Pale   Skin Condition/Temp Warm;Dry   Appetite Fair   RUE Edema Trace   LUE Edema Trace   RLE Edema None   LLE Edema None   Vital Signs   BP 130/72   Temp 98 F (36.7 C)   Pulse 94   Respirations 16   Pain Assessment   Pain Assessment None - Denies Pain   Tunneled Hemodialysis Catheter Right Subclavian   Placement Date: 07/01/22   Present on Admission/Arrival: No  Inserted by: Hope Riddell-NP  Insertion Practices: Chlorohexadine skin antisepsis;Maximal barrier precautions;Sterile ultrasound technique;Optimal catheter site selection;Hand hygiene  Orien...   Access Status  Not Accessed   Continued need for line? Yes   Site Assessment Clean, dry & intact   Venous Lumen Status Flushed;Heparin locked;Capped   Arterial Lumen Status Flushed;Heparin locked;Capped   Alcohol Cap Used No   Line Care Ports  disinfected;Connections checked and tightened   Dressing Type Bacteriocidal;Transparent   Date of Last Dressing Change 07/24/22   Dressing Status Clean, dry & intact   Dressing Change Due 07/28/22   Treatment Initiation   Dialyze Hours 3   During Hemodialysis Assessment   Ultrafiltration Removed (mL) 1500 ml/min   Post-Hemodialysis Assessment   Post-Treatment Procedures Blood returned;Catheter capped, clamped and heparinized x 2 ports   Control and instrumentation engineer Disinfection;Acid/Vinegar Clean;Heat Disinfect   Rinseback Volume (ml) 300 ml   Blood Volume Processed (Liters) 67.7 L   Dialyzer Clearance Lightly streaked   Duration of Treatment (minutes) 180 minutes   Hemodialysis Intake (ml) 500 ml   Hemodialysis Output (ml) 1500 ml   NET Removed (ml) 1000   Tolerated Treatment Good   Patient Disposition Return to room     Primary RN SBAR: Roby Lofts, RN  Comments: no issues with treatment.

## 2022-07-27 NOTE — Progress Notes (Signed)
Physical Therapy:  Patient remains OTF for HD, unavailable for PT session. Will defer and continue to follow.    Margaruite Top Wynetta Emery PT, DPT

## 2022-07-27 NOTE — Progress Notes (Signed)
Occupational Therapy     Attempted to see patient for OT treatment, but he is currently off the floor. Will follow back later today or tomorrow for OT Reevaluation.     Jaime [name] P Zakyla Tonche, OTR/L

## 2022-07-27 NOTE — Care Coordination-Inpatient (Signed)
Transition of Care Plan:     RUR: 18% (moderate RUR)  Prior Level of Functioning: Independent  Disposition: SNF in [place] with HD set up  If SNF or IPR: Date FOC offered: 06/30/22  Date FOC received: Wife agrees with plan - prefers location closer to [place]  Accepting facility: TBD  Date authorization started with reference number: TBD  Date authorization received and expires: TBD  Follow up appointments: new PCP and Specialist in [place] (ID, nephrology, podiatry)  DME needed: TBD.  Transportation at discharge: [place] transport likely needed   IM/IMM Medicare/Tricare letter given: 2nd IM needed prior to discharge.  Is patient a Veteran and connected with VA? Yes              If yes, was [facility] transfer form completed and VA notified? Yes  Caregiver Contact: Taner Rzepka - spouse - [phone]  Discharge Caregiver contacted prior to discharge?   Care Conference needed? no  Barriers to discharge: liver drain reassessment, ID to see, HD setup, PT/OT, insurance authorization, transportation to and from OP HD via stretcher       CM Team Lead following case; CM Team Lead and patient's spouse was previously looking into [facility] and [facility] in [place].  Form 2728 to be signed by nephrologist was previous requested.       Lucille Passy, BSN, RN    Care Management  [phone]

## 2022-07-27 NOTE — Plan of Care (Signed)
Problem: Safety - Adult  Goal: Free from fall injury  Outcome: Progressing  Flowsheets (Taken 07/27/2022 0742)  Free From Fall Injury: Instruct family/caregiver on patient safety     Problem: Pain  Goal: Verbalizes/displays adequate comfort level or baseline comfort level  Outcome: Progressing     Problem: Discharge Planning  Goal: Discharge to home or other facility with appropriate resources  Outcome: Progressing     Problem: Skin/Tissue Integrity - Adult  Goal: Skin integrity remains intact  Outcome: Progressing  Flowsheets (Taken 07/27/2022 0742)  Skin Integrity Remains Intact: Monitor for areas of redness and/or skin breakdown     Problem: Skin/Tissue Integrity - Adult  Goal: Incisions, wounds, or drain sites healing without S/S of infection  Outcome: Progressing  Flowsheets (Taken 07/27/2022 0742)  Incisions, Wounds, or Drain Sites Healing Without Sign and Symptoms of Infection: Implement wound care per orders     Problem: Skin/Tissue Integrity - Adult  Goal: Oral mucous membranes remain intact  Outcome: Progressing  Flowsheets (Taken 07/27/2022 0742)  Oral Mucous Membranes Remain Intact:   Assess oral mucosa and hygiene practices   Implement preventative oral hygiene regimen   Implement oral medicated treatments as ordered     Problem: Skin/Tissue Integrity  Goal: Absence of new skin breakdown  Description: 1.  Monitor for areas of redness and/or skin breakdown  2.  Assess vascular access sites hourly  3.  Every 4-6 hours minimum:  Change oxygen saturation probe site  4.  Every 4-6 hours:  If on nasal continuous positive airway pressure, respiratory therapy assess nares and determine need for appliance change or resting period.  Outcome: Progressing     Problem: ABCDS Injury Assessment  Goal: Absence of physical injury  Outcome: Progressing  Flowsheets (Taken 07/27/2022 0742)  Absence of Physical Injury: Implement safety measures based on patient assessment

## 2022-07-27 NOTE — Progress Notes (Signed)
Name: Jaime [name]   MRN: [id number]  DOB: [birth year]  07/27/2022 1:26 PM      Admit Date: 05/31/2022    Admit Diagnosis: Septicemia ([place]) [A41.9]  Gastric perforation ([place]) [K25.5]  Perforated abdominal viscus [R19.8]    Assessment/Plan:     Principal Problem:    Gastric perforation ([place])  Active Problems:    Poorly controlled type 2 diabetes mellitus (HCC)    Intestinal obstruction (HCC)    Severe sepsis (HCC)    Septic shock (HCC)    Escherichia coli septicemia (HCC)    Liver abscess    Pneumoperitoneum    Acute respiratory failure with hypoxia (HCC)    AKI (acute kidney injury) ([place])    Multi-organ failure with heart failure (HCC)    Thrombocytopenia (HCC)    E coli bacteremia    Hepatic abscess    Peripheral arterial disease (HCC)    Hyperbilirubinemia    Gram negative sepsis (HCC)    Septicemia (HCC)    Aspiration pneumonia of both lower lobes (HCC)    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  Patient being treated with medication that requires intensive monitoring due to increased risk for toxicity. I have reviewed patient's presenting subjective and objective findings, as well as all laboratory studies, imaging studies, and vital signs to date as well as treatment rendered and patient's response to those treatments.  In addition, prior medical, surgical and relevant social and family histories were reviewed.        Discussion:     KRT initiated 7/31; now on  MWF schedule; perm cath 8/30.   Needs outpatient HD placement under ESKD diagnosis - CM working on options (here or back in [place])  Watch for renal recovery, but none so far.  H/H not at goal.  Continue ESA.      Plan:     Tolerating dialysis well today.  1.5 L of fluid being removed.  Access is working well .continue blood flow 400 and dialysate flow rate 600      Caren Griffins, MD    Dr Elenor Legato  Cell no- [phone]  Available on perfect serve.      Signed By: Caren Griffins, MD     July 27, 2022         Subjective:   Hospital day 22   Seen and examined.  Jaime [name] was being dialyzed when I saw him.  Had no complaints.  Resting and comfortable  Review of Systems:  Pertinent items are noted in the History of Present Illness.   Objective:    BP 129/76   Pulse 89   Temp 98.2 F (36.8 C)   Resp 17   Ht 1.727 m (5\' 8" )   Wt 91.9 kg (202 lb 9.6 oz)   SpO2 96%   BMI 30.81 kg/m     Physical  Exam:  Physical Exam  Vitals and nursing note reviewed.   Constitutional:       Appearance: Normal appearance.   HENT:      Head: Normocephalic and atraumatic.      Nose: Nose normal.      Mouth/Throat:      Mouth: Mucous membranes are moist.   Cardiovascular:      Rate and Rhythm: Tachycardia present.      Pulses: Normal pulses.      Heart sounds: Normal heart sounds.   Pulmonary:      Effort: Pulmonary effort is normal.   Abdominal:      General: Abdomen is flat.   Musculoskeletal:      Cervical back: Neck supple.      Right lower leg: Edema present.      Left lower leg: Edema present.   Skin:     Coloration: Skin is pale.      Findings: Lesion and rash present.   Neurological:      General: No focal deficit present.      Mental Status: Jaime [name] is oriented to person, place, and time. Mental status is at baseline.      Abdominal abscess drain in place.    Recent Results (from the past 24 hour(s))   POCT Glucose    Collection Time: 07/26/22  4:44 PM   Result Value Ref Range    POC Glucose 110 65 - 117 mg/dL    Performed by:  Delorise Shiner PCT    POCT Glucose    Collection Time: 07/26/22  9:36 PM   Result Value Ref Range    POC Glucose 164 (H) 65 - 117 mg/dL    Performed by: Maye Hides RN    CBC    Collection Time: 07/27/22  1:01 AM   Result Value Ref Range    WBC 12.2 (H) 4.1 - 11.1 K/uL    RBC 2.46 (L) 4.10 - 5.70 M/uL    Hemoglobin 7.8 (L) 12.1 - 17.0 g/dL    Hematocrit 00.9 (L) 36.6 - 50.3 %    MCV 101.2 (H) 80.0 - 99.0 FL    MCH 31.7 26.0 - 34.0 PG    MCHC 31.3 30.0 - 36.5 g/dL    RDW 23.3 (H) 00.7 - 14.5 %    Platelets 186 150 - 400 K/uL    MPV 10.2 8.9 - 12.9 FL    Nucleated RBCs 0.0 0 PER 100 WBC    nRBC 0.00 0.00 - 0.01 K/uL   Basic Metabolic Panel    Collection Time: 07/27/22  1:01 AM   Result Value Ref Range    Sodium 138 136 - 145 mmol/L    Potassium 3.9 3.5 - 5.1 mmol/L    Chloride 104 97 - 108 mmol/L    CO2 30 21 - 32 mmol/L    Anion Gap 4 (L) 5 - 15 mmol/L    Glucose 126 (H) 65 - 100 mg/dL    BUN 36 (H) 6 - 20 MG/DL    Creatinine 6.22 (H) 0.70 - 1.30 MG/DL    Bun/Cre Ratio 10 (L) 12 - 20      Est, Glom Filt Rate 17 (L) >60 ml/min/1.71m2    Calcium 8.4 (L) 8.5 - 10.1 MG/DL   POCT Glucose    Collection Time: 07/27/22  7:56 AM   Result Value Ref Range    POC Glucose 137 (H) 65 - 117 mg/dL    Performed by: Daniel Nones  CON  No intake or output data in the 24 hours ending 07/27/22 1326       Data Review:   Recent Labs     07/27/22  0101   NA 138   K 3.9   BUN 36*   CREATININE 3.66*   WBC 12.2*   HGB 7.8*   HCT 24.9*   PLT 186       No current facility-administered medications on file prior to encounter.     Current Outpatient Medications on File Prior to Encounter   Medication Sig Dispense Refill    metFORMIN (GLUCOPHAGE) 500 MG tablet Take 1 tablet by mouth daily (Patient not taking: Reported on 06/08/2022)      metoprolol (LOPRESSOR) 100 MG tablet Take 1 tablet by mouth daily      hydrALAZINE (APRESOLINE) 25 MG tablet Take 1 tablet by mouth 3 times daily (Patient not taking: Reported on 06/08/2022)       atorvastatin (LIPITOR) 40 MG tablet Take 1 tablet by mouth daily (Patient not taking: Reported on 06/08/2022)      losartan (COZAAR) 100 MG tablet Take 1 tablet by mouth daily (Patient not taking: Reported on 06/08/2022)      metFORMIN (GLUCOPHAGE-XR) 500 MG extended release tablet Take 1 tablet by mouth daily (with breakfast) (Patient not taking: Reported on 06/08/2022)      hydroCHLOROthiazide (HYDRODIURIL) 25 MG tablet TAKE 1 TABLET BY MOUTH ONCE DAILY AS NEEDED (Patient not taking: Reported on 06/08/2022)      metoprolol succinate (TOPROL XL) 100 MG extended release tablet TAKE 1 TABLET BY MOUTH ONCE DAILY WITH OR IMMEDIATELY FOLLOWING A MEAL          Full Code  Care Plan discussed with:  Patient x    Family  x    RN     Dialysis RN     Care Manager        Hospitalist    Consultant:            Comments   >50% of visit spent in counseling and coordination of care           Signed By: Bryan Lemma, MD     July 27, 2022       This dictation was done by dragon, computer voice recognition software.  Often unanticipated grammatical, syntax, phones and other interpretive errors are inadvertently transcribed.  Please excuse errors that have escaped final proofreading. Please contact me if you suspect dictation or transcription errors.      Dr Dorthey Sawyer    Office No- [phone]  [facility]  [address]  [address]  [place], [place] [address]    Fax: [phone]

## 2022-07-27 NOTE — Plan of Care (Signed)
Problem: Occupational Therapy - Adult  Goal: By Discharge: Performs self-care activities at highest level of function for planned discharge setting.  See evaluation for individualized goals.  Description: FUNCTIONAL STATUS PRIOR TO ADMISSION:     ADL Assistance: Independent, Ambulation Assistance: Independent, Transfer Assistance: Independent, Active Driver: Yes     HOME SUPPORT: Patient lived with spouse and was independent.    Occupational Therapy Goals:  Initiated 06/10/2022; Re-Evaluation 06/20/2022.    Weekly Reassessment 06/29/22 - Continue as below. Goal #6 added. Goals reviewed and revised PRN as per 9/7 weekly reevaluation.   Goals reviewed and revised 9-14. Goals reviewed and revised PRN as per 9/25 reevaluation.     1.  Patient will perform grooming at bed level with Moderate Assist within 7 day(s). Goal met, change to supervision/setup.  2.  Patient will perform self-feeding with Moderate Assist within 7 day(s). Continue goal. Met: upgrade to mod I in 7 days, not met, continue goal  3.  Patient will perform upper body dressing with Maximal Assist within 7 day(s). Goal met, change to min A.   4.  Patient will complete supine>sit with mod assist x2  within 7 day(s). Goal met, change to min A of 1. Not met, continue goal.  5.  Patient will tolerate sitting EOB with fair sitting balance in preparation for ADLs within 7 day(s). Goal  met, change to good.Goal met, Change to good dynamic sitting balance.   6.  Patient will participate in BUE therapeutic activity / exercise with Minimal Assist within 7 day(s). Goal met, change to S distally and min A proximally within 7 days. met  7.  Patient will participate in cog screening to clarify discharge planning within 7 days. Omit goal.       07/27/2022 1754 by Greg Cutter, OTR/L  Outcome: [place] THERAPY RE-EVALUATION  Patient: Jaime [name] (76 y.o. male)  Date: 07/27/2022  Primary Diagnosis: Septicemia ([place]) [A41.9]  Gastric perforation ([place])  [K25.5]  Perforated abdominal viscus [R19.8]  Procedure(s) (LRB):  BILATERAL TRANSMETATARSAL AMPUTATION (Bilateral) 4 Days Post-Op     Precautions:  (B heel WB s/p TMAs) Right Lower Extremity Weight Bearing: Non Weight Bearing Left Lower Extremity Weight Bearing: Non Weight Bearing            Chart, occupational therapy assessment, plan of care, and goals were reviewed.    ASSESSMENT  The patient is now POD 4 for B TMAs, only permitted to WB on B heels. He also remains greatly limited by GW which is most significant in his BLEs and shoulders, decreased B shoulder ROM, his poor activity tolerance and decreased sitting balance. Overall he is CGA to mod A for bed mobility, demonstrates fair dynamic sitting balance and he is supervision to total A for ADLs. He is very cooperative and receptive to therapy intervention, but requires encouragement to truly push himself during strengthening and endurance building activities. All VSS t/o this session, despite his HR being up to 124 during activity today as this is due to the patient being so deconditioned. At this time the patient will continue to benefit from acute OT and he will need IPR after discharge in order to maximize his functional potential.           PLAN :  Recommendations and Planned Interventions:   self care training, therapeutic activities, functional mobility training, balance training, therapeutic exercise, endurance activities, patient education, and home safety training    Frequency/Duration: OT Plan of Care: 3 times/week  Recommendation for discharge: (in order for the patient to meet his/her long term goals): Therapy 3 hours/day 5-7 days/week    IF patient discharges home will need the following DME: continuing to assess with progress       SUBJECTIVE:   Patient stated "I'll sleep tonight."    OBJECTIVE DATA SUMMARY:   Hospital course since last seen and reason for reevaluation: B TMAs performed on 9/2, now B heel WB only.    Cognitive/Behavioral  Status:    Orientation  Overall Orientation Status: Within Normal Limits  Orientation Level: Oriented X4    Range of Motion:   AROM:  (BLE grossly decreased, nonfunctional for the performance of functional mobility. BUE generally decreased/functional with the exception of B shoulders being nonfunctional for the performance of ADLs.)         Strength:  Strength:  (BLE grossly decreased, nonfunctional for the performance of functional mobility. BUE generally decreased/functional with the exception of B shoulders being nonfunctional for the performance of ADLs.)      Coordination:  Generally Decreased /functional for BUE. Nonfunctional for BLEs 2/2 weakness             Tone & Sensation:   Tone: Normal           Functional Mobility and Transfers for ADLs:  Bed Mobility:     Bed Mobility Training  Rolling: Contact-guard assistance  Supine to Sit: Moderate assistance;Additional time  Sit to Supine: Moderate assistance;Additional time  Scooting: Moderate assistance;Maximum assistance    Transfers:          Balance:  Balance  Sitting: Impaired  Sitting - Static: Good (unsupported)  Sitting - Dynamic: Fair (occasional)    ADL Assessment:  Feeding: Supervision;Setup       Grooming: Minimal assistance;Setup       UE Bathing: Moderate assistance       LE Bathing: Dependent/Total       UE Dressing: Moderate assistance (Donned gown as a robe this session.)       LE Dressing: Dependent/Total (to don socks)       Toileting: Dependent/Total       ADL Intervention and task modifications:    Patient performed UB dressing seated EOB with mod A today and was total A for donning socks.      EXERCISE   Sets   Reps   Active Active Assist   Passive   Comments   Scapular depression 1 10 [x]            []            []            Using Thera-band for resistance (yellow). Performed in supine.    Scapular  elevation 1 10 [x]            []            []               Scapular retraction 1 10 [x]            []            []            Using Thera-band for  resistance (yellow). Performed seated EOB.   B shoulder flexion 1 10 []            [x]            []               B shoulder abduction  1 10 []            [x]            []               Trunk rotation 1 5 [x]            []            []               Lateral trunk flexion 1 5 [x]            []            []               BUE  rowing   1 10 []            []            []            Performed seated EOB with green Thera-band for resistance.        Pain Rating:  Occasional abdominal pain due to constipation   Pain Intervention(s):   rest and repositioning    Activity Tolerance:   Poor  VSS.   HR up to 124 during therapeutic exercise 2/2 patient being deconditioned    After treatment:   Patient left in no apparent distress in bed and Call bell within reach    COMMUNICATION/EDUCATION:   The patient's plan of care was discussed with: registered nurse    Patient Education  Education Given To: Patient  Education Provided: Home Exercise Program  Education Method: Verbal;Demonstration;Teach Back  Barriers to Learning: None  Education Outcome: Continued education needed    Thank you for this referral.  Traevion Poehler P Fatih Stalvey, OTR/L  Minutes: 22

## 2022-07-27 NOTE — Wound Image (Signed)
Wound care nurse unable to reassess the wound on the abdomen today. Nursing to do the wound care today.  I will see him tomorrow or Wednesday.   Clyda Greener, RN, BSN, [employer]

## 2022-07-27 NOTE — Care Coordination-Inpatient (Addendum)
Team lead note:    CM left a VM for Santiago Glad at [facility] in [facility] [place] - awaiting call back at this time.      Been in contact with [facility] and they continue to seek resources in their system.    [facility] locally is able to accomodate patient pending insurance auth, bed availibility and if nephrology willing to sign 2297 for ESRD certification.  Chales Abrahams, MSW  [phone]

## 2022-07-28 LAB — POCT GLUCOSE
POC Glucose: 129 mg/dL — ABNORMAL HIGH (ref 65–117)
POC Glucose: 87 mg/dL (ref 65–117)
POC Glucose: 98 mg/dL (ref 65–117)
POC Glucose: 87 mg/dL (ref 65–117)

## 2022-07-28 MED FILL — ACETAMINOPHEN 325 MG PO TABS: 325 MG | ORAL | Qty: 2

## 2022-07-28 MED FILL — HEPARIN SODIUM (PORCINE) 5000 UNIT/ML IJ SOLN: 5000 UNIT/ML | INTRAMUSCULAR | Qty: 1

## 2022-07-28 MED FILL — MIRTAZAPINE 15 MG PO TABS: 15 MG | ORAL | Qty: 1

## 2022-07-28 MED FILL — RENVELA 2.4 G PO PACK: 2.4 g | ORAL | Qty: 2.4

## 2022-07-28 NOTE — Progress Notes (Signed)
Hospitalist Progress Note    NAME:   Jaime [name]   DOB: [birth year]   MRN: [id number]     Date/Time: 07/28/2022 8:44 PM  Patient PCP: No primary care provider on file.    Estimated discharge date: Next week  Barriers: Final antibiotic    For reference :   76 y/m with prolonged hospitalization, was admitted to surgical service on 7/31 for perforated hollow viscus underwent emergent laparotomy/ septic shock d/t peritonitis, was intubated and extubated x 2. Also had AKI needing CVVH, now on HD. Also has bilateral foot gangrene. Was transferred to hospitalist service on 8/21.     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 Other option could be to go to an SNF in [place] ) .     9/13:on Omnicef p.o. due to lack of IV access.   plan to continue Omnicef p.o. x2 more weeks  ending 8 weeks on 9/ 29.  9/15: Patient stable   9/16: Medically stable   Awaiting placement   continue Omnicef p.o.until  on 9/ 29.  9/17  2 episodes of fever 100.4-relieved with Tylenol  No gross symptoms of infection  Examination unremarkable  Patient on on antibiotics-we will monitor for the fever  If persistent high-grade fever will start work-up  ID following  9/18  Remained afebrile overnight  No new leukocyte noted on CBC  Continue with antibiotic as per ID  9/19 and 9/20:  No acute issues, continue with  the same management         #Acute hypoxemic respiratory failure:  Intubated 7/31, extubation 8/7  -Reintubated 8/13, extubated on 8/19  -Currently on 2 liters [place]  -Weaned off o2      #Dysphagia:  -Ongoing SLP evaluation-patient tolerating small diet .   -Aspiration on FEES study  -Follow-up SLP evaluation     #Acute kidney injury:  -Now on HD  -Continue with HD  -Nephrology following  -Case management informed the need to set up HD at SNF prior to discharge .            #Diabetes mellitus:  -Sliding scale and lantus  -Monitor FS  -Diabetes team following     #Sacral wound:  -Wound care following    Pending placement        Medical Decision Making:   I personally reviewed labs:   I personally reviewed imaging:  I personally reviewed EKG:  Toxic drug monitoring:   Discussed case with: Patient , RN        Code Status: Full   DVT Prophylaxis: Heparin   GI Prophylaxis:    Subjective:     Chief Complaint / Reason for Physician Visit  "Follow up case. No active complaints. ".  Discussed with RN events overnight.       Objective:     VITALS:  Last 24hrs VS reviewed since prior progress note. Most recent are:  Patient Vitals for the past 24 hrs:   BP Temp Temp src Pulse Resp SpO2   07/28/22 1539 (!) 144/68 97.5 F (36.4 C) Oral 99 18 97 %   07/28/22 1200 113/71 97.9 F (36.6 C) Oral 94 18 98 %   07/28/22 0800 (!) 125/55 97.9 F (36.6 C) Oral 88 18 98 %   07/28/22 0300 (!) 118/50 97.6 F (36.4 C) Oral 90 18 96 %   07/27/22 2345 113/60 97.8 F (36.6 C) Oral 99 15 97 %           Intake/Output Summary (Last 24 hours) at 07/28/2022 2044  Last data filed at 07/28/2022 1900  Gross per 24 hour   Intake 860 ml   Output 570 ml   Net 290 ml              I had a face to face encounter and independently examined this patient on 07/28/2022, as outlined below:  PHYSICAL EXAM:  General: Alert, cooperative  EENT:  EOMI. Anicteric sclerae..Rt. hd catn - jugular   Resp:  CTA bilaterally, no wheezing or rales.  No accessory muscle  use  CV:  Regular  rhythm,  No edema  GI:  Soft, Non distended, Non tender.  +Bowel sounds, wound vac in place , drain on rt upper quadrant   Neurologic:  Alert and oriented X 3, normal speech,   Psych:   Good insight. Not anxious nor agitated  Foot :  bilateral feet - gangrene of most of the toes  , not yet full demarcation     Reviewed most current lab test results and cultures  YES  Reviewed most current radiology test results   YES  Review and summation of old records today    NO  Reviewed patient's current orders and MAR    YES  PMH/SH reviewed - no change compared to H&P  ________________________________________________________________________  Care Plan discussed with:    Comments   Patient x    Family      RN x    Care Manager     Consultant                        Multidiciplinary team rounds were held today with case manager, nursing, pharmacist and Occupational psychologist.  Patient's plan of care was discussed; medications were reviewed and discharge planning was addressed.     ________________________________________________________________________  Total NON critical care TIME:  25  Minutes    Total CRITICAL CARE TIME Spent:   Minutes non procedure based      Comments   >50% of visit spent in counseling and coordination of care     ________________________________________________________________________  Antionette Fairy, MD     Procedures: see electronic medical records for all procedures/Xrays and details which were not copied into this note but were reviewed prior to creation of Plan.      LABS:  I reviewed today's most current labs and imaging studies.  Pertinent labs include:  Recent Labs     07/27/22  0101   WBC 12.2*   HGB 7.8*   HCT 24.9*   PLT 186         Recent Labs     07/27/22  0101   NA 138   K 3.9   CL 104   CO2 30   GLUCOSE 126*   BUN 36*   CREATININE 3.66*  CALCIUM 8.4*           Signed: Terence Lux, MD

## 2022-07-28 NOTE — Wound Image (Signed)
Wound Care Reassessment for the Abdominal wound and the Sacrum wound today.  Reviewed last week and this weeks notes so far and pt. May go to [facility] next week (?).   Wounds on the bilateral feet followed by Dr. Ewell Poe. Post op Transmetatarsal dressings in place and will be betadine after that. He will write final DC wound care orders.     Assessment of the Abdomen and the Sacrum / Buttocks: the wounds are clean and the wound care is done daily by the staff.   Podiatry will most likely see him today for follow up.     Overall - Abdominal wound and the  Surrounding skin.       Close up of Abdominal wound  Distal end: progressing.       Sacrum Buttocks wound healing slowly  Continue Santyl to heal the wound.      Plan: Continue the wound care as written. There is a different dressing in the Abdominal wound today only.   Keep patient off of the Sacrum with good turning.   Clyda Greener, RN, BSN, [employer]

## 2022-07-28 NOTE — Progress Notes (Signed)
Comprehensive Nutrition Assessment    Type and Reason for Visit:  Reassess    Nutrition Recommendations/Plan:   Continue current diet  Change supplement from Nepro back to Ensure high protein per pt preference. Will monitor renal labs for diet/supplement adjustments as needed.  Please document % meals and supplements consumed in flowsheet I/O's under intake   Anything we can do for his dry mouth? This has been a repeated complaint. Brought to MD's attention     Malnutrition Assessment:  Malnutrition Status:  At risk for malnutrition (Comment) (07/28/22 1511)        Nutrition Assessment:     Chart reviewed and case discussed during IDR. Pt reports his appetite is about the same. He is eating about 50% of meals, but now only drinking about half of one supplement per day. He complains that his mouth is dry and the food does not taste good which contributes to his poor PO intake. RD re-emphasized importance of protein for wound healing s/p amputations and remaining abdominal wound. He liked the Ensure better than the Nepro shakes. K and phos have been better since he started HD, so will change him back to Ensure. Remeron was started on 9/21, so hopefully this can help promote a better appetite as well. Will continue monitoring.     Patient Vitals for the past 120 hrs:   PO Meals Eaten (%)   07/27/22 1740 51 - 75%   07/27/22 1452 26 - 50%   07/26/22 1130 26 - 50%   07/25/22 1157 26 - 50%   07/24/22 1302 26 - 50%     Wt Readings from Last 5 Encounters:   07/22/22 91.9 kg (202 lb 9.6 oz)   ]    Nutrition Related Findings:    Labs: BG [id number].   Meds: probiotic, rocephin, humalog, remeron, renvela, MVI.   BM 9/25.   Wound Type: Deep Tissue Injury, Surgical Incision, Wound Vac       Current Nutrition Intake & Therapies:    Average Meal Intake: 26-50%  Average Supplements Intake: 1-25%, 26-50%  ADULT ORAL NUTRITION SUPPLEMENT; Breakfast, Dinner; Low Calorie/High Protein Oral Supplement  ADULT DIET; Regular; 5 carb choices  (75 gm/meal); NO iced tea. Vanilla Ensure high protein preferred    Anthropometric Measures:  Height: 172.7 cm (5' 8" )  Ideal Body Weight (IBW): 154 lbs (70 kg)       Current Body Weight: 202 lb 9.6 oz (91.9 kg), 154.2 % IBW. Weight Source: Bed Scale  Current BMI (kg/m2): 30.8        Weight Adjustment For: No Adjustment                 BMI Categories: Obese Class 1 (BMI 30.0-34.9)    Estimated Daily Nutrient Needs:  Energy Requirements Based On: Formula  Weight Used for Energy Requirements: Current  Energy (kcal/day): MSJ 2150 (1795 x 1.2)  Weight Used for Protein Requirements: Ideal  Protein (g/day): 119-140g (1.7-2gPro/kg IBW)  Method Used for Fluid Requirements: Other (Comment)  Fluid (ml/day): per MD    Nutrition Diagnosis:   Increased nutrient needs related to  (protein) as evidenced by wounds, dialysis    Nutrition Interventions:   Food and/or Nutrient Delivery: Continue Current Diet, Continue Oral Nutrition Supplement  Nutrition Education/Counseling: No recommendation at this time  Coordination of Nutrition Care: Continue to monitor while inpatient, Interdisciplinary Rounds       Goals:  Previous Goal Met: Progress towards Goal(s) Declining  Goals: PO intake 75% or greater,  by next RD assessment       Nutrition Monitoring and Evaluation:   Behavioral-Environmental Outcomes: None Identified  Food/Nutrient Intake Outcomes: Food and Nutrient Intake, Supplement Intake  Physical Signs/Symptoms Outcomes: Biochemical Data, Nutrition Focused Physical Findings, Skin, Weight, GI Status, Hemodynamic Status, Fluid Status or Edema    Discharge Planning:    Continue current diet, Continue Oral Nutrition Supplement     Kristin Bruins, RD  Contact: [phone]

## 2022-07-28 NOTE — Progress Notes (Addendum)
0700: Bedside shift change report given to [place] (oncoming nurse) by night RN (offgoing nurse). Report included the following information Nurse Handoff Report.     1900: Handoff report given to [employer].

## 2022-07-28 NOTE — Plan of Care (Signed)
Problem: Physical Therapy - Adult  Goal: By Discharge: Performs mobility at highest level of function for planned discharge setting.  See evaluation for individualized goals.  Description: FUNCTIONAL STATUS PRIOR TO ADMISSION: Patient was independent and active without use of DME. Drove himself to [place] from [place] for [place] race. Denies home O2 use.     HOME SUPPORT PRIOR TO ADMISSION: The patient lived with wife however wife has her own medical issues and cannot provide physical assist.    Physical Therapy Goals  Re-evaluated 07/24/22 s/p bilat TMA, goals revised as patient is now NWB bilat LE's  1.  Patient will move from supine to sit and sit to supine, scoot up and down, and roll side to side in bed with mod assist  within 7 day(s).   2. Patient will sit EOB x 15 minutes with contact guard assist within 7 days.    3.  Patient will perform t-transfer bed<>chair Mod A within 7 days.    Re-assessed 07/21/22; goals remain appropriate  Re-assessed 07/14/22; goals remain appropriate  Re-Assessment 07/07/22 remain appropriate  1.  Patient will move from supine to sit and sit to supine, scoot up and down, and roll side to side in bed with mod assist  within 7 day(s).   2. Patient will sit EOB x 15 minutes with contact guard assist within 7 days.    3.  Patient will perform sit to stand with maximal assistance x2 within 7 day(s). - discontinued 07/24/22  4.  Patient will transfer from bed to chair and chair to bed via minimal lift equipment (best mover) within 7 day(s). - discontinued 07/24/22    Re-Assessment 06/29/22  1.  Patient will move from supine to sit and sit to supine, scoot up and down, and roll side to side in bed with maximal assist of 1 within 7 day(s).   2. Patient will sit EOB x 15 minutes with contact guard assist within 7 days.    3.  Patient will perform sit to stand with maximal assistance x2 within 7 day(s).  4.  Patient will transfer from bed to chair and chair to bed via minimal lift equipment  (best mover) within 7 day(s).    Reassessed 06/20/2022   1.  Patient will move from supine to sit and sit to supine, scoot up and down, and roll side to side in bed with maximal assist of 1 within 7 day(s).   2. Patient will sit EOB x 5 minutes with contact guard assist within 7 days.    3.  Patient will perform sit to stand with maximal assistance x2 within 7 day(s).  4.  Patient will transfer from bed to chair and chair to bed via minimal lift equipment (best mover) within 7 day(s).    Initiated 06/10/2022  1.  Patient will move from supine to sit and sit to supine, scoot up and down, and roll side to side in bed with contact guard assist within 7 day(s).   2. Patient will sit EOB x 10 minutes with supervision/set-up assist within 7 days.    2.  Patient will perform sit to stand with moderate assistance x2 within 7 day(s).  3.  Patient will transfer from bed to chair and chair to bed with moderate assistance x2 using the least restrictive device within 7 day(s).  4.  Patient will ambulate with moderate assistance x2 for 5 feet with the least restrictive device within 7 day(s).   Outcome: Progressing  PHYSICAL THERAPY TREATMENT    Patient: Jaime [name] (76 y.o. 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 Patient resting in bed and agreeable to PT session though c/o fatigue because he didn't sleep well.  Patient performed supine cycling (RT 300) in supine with HOB elevated to 50 degrees to improve ROM and BLE strength.  No FES was used  at present date to introduce activity gradually. Patient actively engaged during session and able to tolerate 6 minutes 17 seconds of cycling. Patient totaled .93 miles and average power 4.5 Watts. Active cycling time was 5 minutes 14 seconds with cuing for pacing as patient attempting to expend all energy at start of cycling. Vitals were stable throughout session on RA with HR up to 114bpm at maximal. Completed rolling with CGA using bed rails at end of session for repositioning. Patient continues to require encouragement to push himself. Continue to recommend rehab at discharge for best possible chance to return to PLOF.        PLAN:  Patient continues to benefit from skilled intervention to address the above impairments.  Continue treatment per established plan of care.    Recommendation for discharge: (in order for the patient to meet his/her long term goals): Therapy 3 hours/day 5-7 days/week    Other factors to consider for discharge: patient's current support system is unable to meet their requirements for physical assistance, high risk for falls, not safe to be alone, concern for safely navigating or managing the home environment, and new weight bearing restrictions limiting activity or patient is unable to maintain       SUBJECTIVE:   Patient stated, " I am willing to try anything."    OBJECTIVE DATA SUMMARY:     Functional Mobility Training:  Bed Mobility:  Bed Mobility Training  Interventions: Verbal cues  Rolling: Contact-guard assistance (with bed rails)    Pain Rating:  0/10     Activity Tolerance:   Fair , requires rest breaks, observed shortness of breath on exertion, and SpO2 stable on room air    After treatment:   Patient left in no apparent distress in bed, Call bell within reach, Bed/ chair alarm activated, and Side rails x3      COMMUNICATION/EDUCATION:   The patient's plan of care was discussed with: registered nurse    Patient Education  Education Given To: Patient  Education Provided: Home  Exercise Program  Education Method: Verbal  Barriers to Learning: None  Education Outcome: Verbalized understanding      Chavonne Sforza, PT  Minutes: 19

## 2022-07-28 NOTE — Care Coordination-Inpatient (Incomplete)
Transition of Care Plan:     RUR:  %  Prior Level of Functioning: independent  Disposition: SNF in [place] with HD set up  If SNF or IPR: Date FOC offered: 06/30/22  Date FOC received: wife agrees with plan - prefers closer to [place]  Accepting facility: pending     Date authorization started with reference number:      Date authorization received and expires:   Follow up appointments: new PCP and Specialist in [place] (ID, nephrology, podiatry)  DME needed:   Transportation at discharge: [facility] stretcher transport- CM spoke with   IM/IMM Medicare/Tricare letter given:   Is patient a Veteran and connected with VA?               If yes, was [employer] transfer form completed and VA notified?   Caregiver Contact:   Discharge Caregiver contacted prior to discharge?   Care Conference needed? no  Barriers to discharge: final IV Antibiotic recommendations, HD setup, PT/OT once stable for insurance authorization, transportation to and from OP HD via stretcher in [place]    CM left VM for [facility] at [phone] for admissions - CM also asked Susie wife to call facility as well.

## 2022-07-28 NOTE — Progress Notes (Signed)
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]  Perforated abdominal viscus [R19.8]    Assessment/Plan:     Principal Problem:    Gastric perforation (HCC)  Active Problems:    Poorly controlled type 2 diabetes mellitus (HCC)    Intestinal obstruction (HCC)    Severe sepsis (HCC)    Septic shock (HCC)    Escherichia coli septicemia (HCC)    Liver abscess    Pneumoperitoneum    Acute respiratory failure with hypoxia (HCC)    AKI (acute kidney injury) (HCC)    Multi-organ failure with heart failure (HCC)    Thrombocytopenia (HCC)    E coli bacteremia    Hepatic abscess    Peripheral arterial disease (HCC)    Hyperbilirubinemia    Gram negative sepsis (HCC)    Septicemia (HCC)    Aspiration pneumonia of both lower lobes (HCC)    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  Patient being treated with medication that requires intensive monitoring due to increased risk for toxicity. I have reviewed patient's presenting subjective and objective findings, as well as all laboratory studies, imaging studies, and vital signs to date as well as treatment rendered and patient's response to those treatments.  In addition, prior medical, surgical and relevant social and family histories were reviewed.        Discussion:     KRT initiated 7/31; now on  MWF schedule; perm cath 8/30.   Needs outpatient HD placement under ESKD diagnosis - CM working on options (here or back in [place])  Watch for renal recovery, but none so far.  H/H not at goal.  Continue ESA.      Plan:     For dialysis tomorrow      Bryan Lemma, MD    Dr Dorthey Sawyer  Cell no- [phone]  Available on perfect serve.      Signed By: Bryan Lemma, MD     July 28, 2022         Subjective:   Hospital day 57   No new complaints to me.  Review of Systems:  Pertinent items are noted in the History of Present Illness.   Objective:    BP (!) 144/68   Pulse 99   Temp 97.5 F (36.4 C) (Oral)   Resp 18   Ht 1.727 m (5\' 8" )   Wt 91.9 kg (202 lb 9.6 oz)   SpO2 97%   BMI 30.81 kg/m     Physical Exam:  Physical Exam  Vitals and nursing note reviewed.   Constitutional:       Appearance: Normal appearance.   HENT:      Head: Normocephalic and atraumatic.  Nose: Nose normal.      Mouth/Throat:      Mouth: Mucous membranes are moist.   Cardiovascular:      Rate and Rhythm: Tachycardia present.      Pulses: Normal pulses.      Heart sounds: Normal heart sounds.   Pulmonary:      Effort: Pulmonary effort is normal.   Abdominal:      General: Abdomen is flat.   Musculoskeletal:      Cervical back: Neck supple.      Right lower leg: Edema present.      Left lower leg: Edema present.   Skin:     Coloration: Skin is pale.      Findings: Lesion and rash present.   Neurological:      General: No focal deficit present.      Mental Status: He is oriented to person, place, and time. Mental status is at baseline.      Abdominal abscess drain in place.    Recent Results (from the past 24 hour(s))   POCT Glucose    Collection Time: 07/27/22  7:23 PM   Result Value Ref Range    POC Glucose 123 (H) 65 - 117 mg/dL    Performed by: Rowan Blase    POCT Glucose    Collection Time: 07/27/22  8:40 PM   Result Value Ref Range    POC Glucose 129 (H) 65 - 117 mg/dL    Performed by: Heywood Iles "Apple" PCT    POCT  Glucose    Collection Time: 07/28/22  7:54 AM   Result Value Ref Range    POC Glucose 87 65 - 117 mg/dL    Performed by: Angelena Sole PCT    POCT Glucose    Collection Time: 07/28/22 11:29 AM   Result Value Ref Range    POC Glucose 98 65 - 117 mg/dL    Performed by: Angelena Sole PCT           Intake/Output Summary (Last 24 hours) at 07/28/2022 1640  Last data filed at 07/28/2022 1600  Gross per 24 hour   Intake 210 ml   Output 210 ml   Net 0 ml          Data Review:   Recent Labs     07/27/22  0101   NA 138   K 3.9   BUN 36*   CREATININE 3.66*   WBC 12.2*   HGB 7.8*   HCT 24.9*   PLT 186         No current facility-administered medications on file prior to encounter.     Current Outpatient Medications on File Prior to Encounter   Medication Sig Dispense Refill    metFORMIN (GLUCOPHAGE) 500 MG tablet Take 1 tablet by mouth daily (Patient not taking: Reported on 06/08/2022)      metoprolol (LOPRESSOR) 100 MG tablet Take 1 tablet by mouth daily      hydrALAZINE (APRESOLINE) 25 MG tablet Take 1 tablet by mouth 3 times daily (Patient not taking: Reported on 06/08/2022)      atorvastatin (LIPITOR) 40 MG tablet Take 1 tablet by mouth daily (Patient not taking: Reported on 06/08/2022)      losartan (COZAAR) 100 MG tablet Take 1 tablet by mouth daily (Patient not taking: Reported on 06/08/2022)      metFORMIN (GLUCOPHAGE-XR) 500 MG extended release tablet Take 1 tablet by mouth daily (with breakfast) (Patient not taking: Reported on 06/08/2022)  hydroCHLOROthiazide (HYDRODIURIL) 25 MG tablet TAKE 1 TABLET BY MOUTH ONCE DAILY AS NEEDED (Patient not taking: Reported on 06/08/2022)      metoprolol succinate (TOPROL XL) 100 MG extended release tablet TAKE 1 TABLET BY MOUTH ONCE DAILY WITH OR IMMEDIATELY FOLLOWING A MEAL          Full Code  Care Plan discussed with:  Patient x    Family  x    RN     Dialysis RN     Care Manager        Hospitalist    Consultant:            Comments   >50% of visit spent in counseling and  coordination of care           Signed By: Bryan Lemma, MD     July 28, 2022       This dictation was done by dragon, computer voice recognition software.  Often unanticipated grammatical, syntax, phones and other interpretive errors are inadvertently transcribed.  Please excuse errors that have escaped final proofreading. Please contact me if you suspect dictation or transcription errors.      Dr Dorthey Sawyer    Office No- [phone]  [facility]  [address]  [address]  [place], [place] [address]    Fax: [phone]

## 2022-07-29 ENCOUNTER — Inpatient Hospital Stay: Admit: 2022-07-29 | Payer: MEDICARE

## 2022-07-29 ENCOUNTER — Inpatient Hospital Stay: Payer: MEDICARE

## 2022-07-29 LAB — BASIC METABOLIC PANEL
Anion Gap: 9 mmol/L (ref 5–15)
BUN: 26 MG/DL — ABNORMAL HIGH (ref 6–20)
Bun/Cre Ratio: 8 — ABNORMAL LOW (ref 12–20)
CO2: 26 mmol/L (ref 21–32)
Calcium: 8.4 MG/DL — ABNORMAL LOW (ref 8.5–10.1)
Chloride: 102 mmol/L (ref 97–108)
Creatinine: 3.19 MG/DL — ABNORMAL HIGH (ref 0.70–1.30)
Est, Glom Filt Rate: 20 mL/min/{1.73_m2} — ABNORMAL LOW (ref >60)
Glucose: 71 mg/dL (ref 65–100)
Potassium: 3.8 mmol/L (ref 3.5–5.1)
Sodium: 137 mmol/L (ref 136–145)

## 2022-07-29 LAB — CBC
Hematocrit: 25 % — ABNORMAL LOW (ref 36.6–50.3)
Hemoglobin: 7.7 g/dL — ABNORMAL LOW (ref 12.1–17.0)
MCH: 31.6 PG (ref 26.0–34.0)
MCHC: 30.8 g/dL (ref 30.0–36.5)
MCV: 102.5 FL — ABNORMAL HIGH (ref 80.0–99.0)
MPV: 10.2 FL (ref 8.9–12.9)
Nucleated RBCs: 0 PER 100 WBC
Platelets: 211 10*3/uL (ref 150–400)
RBC: 2.44 M/uL — ABNORMAL LOW (ref 4.10–5.70)
RDW: 15.5 % — ABNORMAL HIGH (ref 11.5–14.5)
WBC: 12.3 10*3/uL — ABNORMAL HIGH (ref 4.1–11.1)
nRBC: 0 10*3/uL (ref 0.00–0.01)

## 2022-07-29 LAB — POCT GLUCOSE
POC Glucose: 80 mg/dL (ref 65–117)
POC Glucose: 75 mg/dL (ref 65–117)
POC Glucose: 88 mg/dL (ref 65–117)

## 2022-07-29 MED ORDER — HEPARIN SODIUM (PORCINE) 1000 UNIT/ML IJ SOLN
1000 UNIT/ML | INTRAMUSCULAR | Status: AC
Start: 2022-07-29 — End: 2022-07-29

## 2022-07-29 MED ORDER — ALBUMIN HUMAN 25 % IV SOLN
25 % | INTRAVENOUS | Status: AC
Start: 2022-07-29 — End: 2022-07-29

## 2022-07-29 MED FILL — SEVELAMER CARBONATE 2.4 G PO PACK: 2.4 g | ORAL | Qty: 2.4

## 2022-07-29 MED FILL — RENVELA 2.4 G PO PACK: 2.4 g | ORAL | Qty: 2.4

## 2022-07-29 MED FILL — HEPARIN SODIUM (PORCINE) 5000 UNIT/ML IJ SOLN: 5000 UNIT/ML | INTRAMUSCULAR | Qty: 1

## 2022-07-29 MED FILL — RETACRIT 3000 UNIT/ML IJ SOLN: 3000 UNIT/ML | INTRAMUSCULAR | Qty: 2

## 2022-07-29 MED FILL — CEFTRIAXONE SODIUM 2 G IJ SOLR: 2 g | INTRAMUSCULAR | Qty: 2000

## 2022-07-29 MED FILL — HEPARIN SODIUM (PORCINE) 1000 UNIT/ML IJ SOLN: 1000 UNIT/ML | INTRAMUSCULAR | Qty: 10

## 2022-07-29 MED FILL — ALBUTEIN 25 % IV SOLN: 25 % | INTRAVENOUS | Qty: 100

## 2022-07-29 NOTE — Plan of Care (Signed)
Problem: Occupational Therapy - Adult  Goal: By Discharge: Performs self-care activities at highest level of function for planned discharge setting.  See evaluation for individualized goals.  Description: FUNCTIONAL STATUS PRIOR TO ADMISSION:     ADL Assistance: Independent, Ambulation Assistance: Independent, Transfer Assistance: Independent, Active Driver: Yes     HOME SUPPORT: Patient lived with spouse and was independent.    Occupational Therapy Goals:  Initiated 06/10/2022; Re-Evaluation 06/20/2022.    Weekly Reassessment 06/29/22 - Continue as below. Goal #6 added. Goals reviewed and revised PRN as per 9/7 weekly reevaluation.   Goals reviewed and revised 9-14. Goals reviewed and revised PRN as per 9/25 reevaluation.     1.  Patient will perform grooming at bed level with Moderate Assist within 7 day(s). Goal met, change to supervision/setup.  2.  Patient will perform self-feeding with Moderate Assist within 7 day(s). Continue goal. Met: upgrade to mod I in 7 days, not met, continue goal  3.  Patient will perform upper body dressing with Maximal Assist within 7 day(s). Goal met, change to min A.   4.  Patient will complete supine>sit with mod assist x2  within 7 day(s). Goal met, change to min A of 1. Not met, continue goal.  5.  Patient will tolerate sitting EOB with fair sitting balance in preparation for ADLs within 7 day(s). Goal  met, change to good.Goal met, Change to good dynamic sitting balance.   6.  Patient will participate in BUE therapeutic activity / exercise with Minimal Assist within 7 day(s). Goal met, change to S distally and min A proximally within 7 days. met  7.  Patient will participate in cog screening to clarify discharge planning within 7 days. Omit goal.       Outcome: Progressing    OCCUPATIONAL THERAPY TREATMENT  Patient: Jaime [name] (76 y.o. male)  Date: 07/29/2022  Primary Diagnosis: Septicemia ([place]) [A41.9]  Gastric perforation ([place]) [K25.5]  Perforated abdominal viscus  [R19.8]  Procedure(s) (LRB):  BILATERAL TRANSMETATARSAL AMPUTATION (Bilateral) 6 Days Post-Op   Precautions:  (B heel WB s/p TMAs) Right Lower Extremity Weight Bearing:  (heel touch for transfers) Left Lower Extremity Weight Bearing:  (heel touch for transfers)            Chart, occupational therapy assessment, plan of care, and goals were reviewed.    ASSESSMENT  Patient continues to slowly progress with OT, motivated and responsive to encouragement to participate in increasingly challenging exercises and functional activity. Overall he was up to mod A for bed mobility, supervision/setup for UB bathing and grooming, min A for UB dressing, mod A to don his socks (supine and crossing leg) and he was mod A for LE bathing.  Patient also participated in UE exercise seated EOB. Improved activity tolerance noted for functional activity and exercise, with the patient demonstrating less frequent SOB and requiring fewer rest breaks. Good balance also noted during exercise and ADLs performed seated EOB. At this time the patient will continue to benefit from acute OT and he will need IPR after discharge.              PLAN :  Patient continues to benefit from skilled intervention to address the above impairments.  Continue treatment per established plan of care to address goals.      Recommendation for discharge: (in order for the patient to meet his/her long term goals): Therapy 3 hours/day 5-7 days/week      IF patient discharges home will need the following  DME: continuing to assess with progress       OBJECTIVE DATA SUMMARY:   Cognitive/Behavioral Status:  Orientation  Overall Orientation Status: Within Normal Limits  Orientation Level: Oriented X4  Cognition  Overall Cognitive Status: WNL    Functional Mobility and Transfers for ADLs:  Bed Mobility:  Bed Mobility Training  Rolling: Stand-by assistance  Supine to Sit: Moderate assistance;Additional time;Assist X1  Sit to Supine: Moderate assistance;Assist X1;Additional  time  Scooting: Minimum assistance     Balance:   Good sitting balance for seated dressing, bathing and grooming         ADL Intervention:    Grooming: Supervision;Setup (to wash face seated EOB)        UE Bathing: Supervision;Setup (seated EOB using wipes)       LE Bathing: Moderate assistance (seated EOB using wipes)       UE Dressing: Minimal assistance (to don/doff hospital gown)       LE Dressing: Moderate assistance (to don sock, supine with HOB raised crossing leg with min A.)        BUE  EXERCISE   Sets   Reps   Active Active Assist   Passive   Comments   Scapular elevation, retraction and depression 1 6 [x]            []            []               Shoulder flexion 1 10 []            [x]            []               Shoulder abduction 1 10 []            [x]            []               BUE  rows   1 15 [x]            []            []            Using green Theraband for resistance.    D1  flexion/ extension 1 6 []            [x]            []               D2 flexion/ extension 1 6 []            [x]            []                     Activity Tolerance:   Fair , requires rest breaks, observed shortness of breath on exertion, and SpO2 stable on room air  Please refer to the flowsheet for vital signs taken during this treatment.    After treatment:   Patient left in no apparent distress in bed and Call bell within reach    COMMUNICATION/EDUCATION:   The patient's plan of care was discussed with: physical therapist    Patient Education  Education Given To: Patient  Education Provided: ADL Adaptive Strategies  Education Method: Verbal;Demonstration;Teach Back  Barriers to Learning: None  Education Outcome: Verbalized understanding;Demonstrated understanding    Thank you for this referral.  Emer Onnen P Vernestine Brodhead, OTR/L  Minutes: 35

## 2022-07-29 NOTE — Progress Notes (Signed)
Name: Jaime [name]   MRN: [id number]  DOB: 01-12-1946  07/29/2022 11:40 AM      Admit Date: 05/31/2022    Admit Diagnosis: Septicemia ([place]) [A41.9]  Gastric perforation ([place]) [K25.5]  Perforated abdominal viscus [R19.8]    Assessment/Plan:     Principal Problem:    Gastric perforation ([place])  Active Problems:    Poorly controlled type 2 diabetes mellitus (HCC)    Intestinal obstruction (HCC)    Severe sepsis (HCC)    Septic shock (HCC)    Escherichia coli septicemia (HCC)    Liver abscess    Pneumoperitoneum    Acute respiratory failure with hypoxia (HCC)    AKI (acute kidney injury) ([place])    Multi-organ failure with heart failure (HCC)    Thrombocytopenia (HCC)    E coli bacteremia    Hepatic abscess    Peripheral arterial disease (HCC)    Hyperbilirubinemia    Gram negative sepsis (HCC)    Septicemia (HCC)    Aspiration pneumonia of both lower lobes (HCC)    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  Patient being treated with medication that requires intensive monitoring due to increased risk for toxicity. I have reviewed patient's presenting subjective and objective findings, as well as all laboratory studies, imaging studies, and vital signs to date as well as treatment rendered and patient's response to those treatments.  In addition, prior medical, surgical and relevant social and family histories were reviewed.        Discussion:     KRT initiated 7/31; now on  MWF schedule; perm cath 8/30.   Needs outpatient HD placement under ESKD diagnosis - CM working on options (here or back in [place])  Watch for renal recovery, but none so far.  Urine output around 500 mL on nondialysis days  H/H not at goal.  Continue ESA.  Creatinine 3.19 and potassium 3.8 predialysis today      Plan:     Dialysis today with 1 L fluid removal.    Tunneled hemodialysis catheter working well yielding a blood flow of 400 mill per minute      Caren Griffins, MD    Dr Elenor Legato  Cell no- [phone]  Available on perfect serve.      Signed By: Caren Griffins, MD     July 29, 2022         Subjective:   Hospital day 58   No new complaints to me.  Seen and examined on dialysis  Review of Systems:  Pertinent items are noted in the History of Present Illness.   Objective:    BP (!) 125/58   Pulse 88   Temp 97.8 F (36.6 C)   Resp 16   Ht 1.727 m (5\' 8" )   Wt 91.9 kg (202 lb 9.6 oz)   SpO2 98%  BMI 30.81 kg/m     Physical Exam:  Physical Exam  Vitals and nursing note reviewed.   Constitutional:       Appearance: Normal appearance.   HENT:      Head: Normocephalic and atraumatic.      Nose: Nose normal.      Mouth/Throat:      Mouth: Mucous membranes are moist.   Cardiovascular:      Rate and Rhythm: Tachycardia present.      Pulses: Normal pulses.      Heart sounds: Normal heart sounds.   Pulmonary:      Effort: Pulmonary effort is normal.   Abdominal:      General: Abdomen is flat.   Musculoskeletal:      Cervical back: Neck supple.      Right lower leg: Edema present.      Left lower leg: Edema present.   Skin:     Coloration: Skin is pale.      Findings: Lesion and rash present.   Neurological:      General: No focal deficit present.      Mental Status: He is oriented to person, place, and time. Mental status is at baseline.      Abdominal abscess drain in place.    Recent Results (from the past 24 hour(s))   POCT Glucose    Collection Time: 07/28/22  4:45 PM   Result Value Ref Range     POC Glucose 87 65 - 117 mg/dL    Performed by: Delorise Shiner PCT    POCT Glucose    Collection Time: 07/28/22  9:13 PM   Result Value Ref Range    POC Glucose 80 65 - 117 mg/dL    Performed by: Beverlee Nims    CBC    Collection Time: 07/29/22  2:35 AM   Result Value Ref Range    WBC 12.3 (H) 4.1 - 11.1 K/uL    RBC 2.44 (L) 4.10 - 5.70 M/uL    Hemoglobin 7.7 (L) 12.1 - 17.0 g/dL    Hematocrit 37.6 (L) 36.6 - 50.3 %    MCV 102.5 (H) 80.0 - 99.0 FL    MCH 31.6 26.0 - 34.0 PG    MCHC 30.8 30.0 - 36.5 g/dL    RDW 28.3 (H) 15.1 - 14.5 %    Platelets 211 150 - 400 K/uL    MPV 10.2 8.9 - 12.9 FL    Nucleated RBCs 0.0 0 PER 100 WBC    nRBC 0.00 0.00 - 0.01 K/uL   Basic Metabolic Panel    Collection Time: 07/29/22  2:35 AM   Result Value Ref Range    Sodium 137 136 - 145 mmol/L    Potassium 3.8 3.5 - 5.1 mmol/L    Chloride 102 97 - 108 mmol/L    CO2 26 21 - 32 mmol/L    Anion Gap 9 5 - 15 mmol/L    Glucose 71 65 - 100 mg/dL    BUN 26 (H) 6 - 20 MG/DL    Creatinine 7.61 (H) 0.70 - 1.30 MG/DL    Bun/Cre Ratio 8 (L) 12 - 20      Est, Glom Filt Rate 20 (L) >60 ml/min/1.40m2    Calcium 8.4 (L) 8.5 - 10.1 MG/DL   POCT Glucose    Collection Time: 07/29/22  7:13 AM   Result Value Ref Range    POC Glucose 75 65 - 117 mg/dL    Performed by: McAfee  Kellie PCT           Intake/Output Summary (Last 24 hours) at 07/29/2022 1140  Last data filed at 07/29/2022 1119  Gross per 24 hour   Intake 1250 ml   Output 1550 ml   Net -300 ml            Data Review:   Recent Labs     07/29/22  0235   NA 137   K 3.8   BUN 26*   CREATININE 3.19*   WBC 12.3*   HGB 7.7*   HCT 25.0*   PLT 211         No current facility-administered medications on file prior to encounter.     Current Outpatient Medications on File Prior to Encounter   Medication Sig Dispense Refill    metFORMIN (GLUCOPHAGE) 500 MG tablet Take 1 tablet by mouth daily (Patient not taking: Reported on 06/08/2022)      metoprolol (LOPRESSOR) 100 MG tablet Take 1 tablet by mouth daily       hydrALAZINE (APRESOLINE) 25 MG tablet Take 1 tablet by mouth 3 times daily (Patient not taking: Reported on 06/08/2022)      atorvastatin (LIPITOR) 40 MG tablet Take 1 tablet by mouth daily (Patient not taking: Reported on 06/08/2022)      losartan (COZAAR) 100 MG tablet Take 1 tablet by mouth daily (Patient not taking: Reported on 06/08/2022)      metFORMIN (GLUCOPHAGE-XR) 500 MG extended release tablet Take 1 tablet by mouth daily (with breakfast) (Patient not taking: Reported on 06/08/2022)      hydroCHLOROthiazide (HYDRODIURIL) 25 MG tablet TAKE 1 TABLET BY MOUTH ONCE DAILY AS NEEDED (Patient not taking: Reported on 06/08/2022)      metoprolol succinate (TOPROL XL) 100 MG extended release tablet TAKE 1 TABLET BY MOUTH ONCE DAILY WITH OR IMMEDIATELY FOLLOWING A MEAL          Full Code  Care Plan discussed with:  Patient x    Family      RN     Dialysis RN x    Care Manager        Hospitalist    Consultant:            Comments   >50% of visit spent in counseling and coordination of care y          Signed By: Bryan Lemma, MD     July 29, 2022       This dictation was done by dragon, computer voice recognition software.  Often unanticipated grammatical, syntax, phones and other interpretive errors are inadvertently transcribed.  Please excuse errors that have escaped final proofreading. Please contact me if you suspect dictation or transcription errors.      Dr Dorthey Sawyer    Office No- [phone]  [facility]  [address]  [address]  [place], [place] [address]    Fax: [phone]

## 2022-07-29 NOTE — Progress Notes (Addendum)
0700: Bedside and Verbal shift change report given to Raquel Sarna, Therapist, sports (oncoming nurse) by Jenny Reichmann, RN (offgoing nurse). Report included the following information Nurse Handoff Report, Index, Intake/Output, MAR, and Recent Results.     0745: Patient off floor to dialysis     1130: Verbal phone report received from dialysis RN. 0.5 L removed from patient during dialysis.     1245: Patient returned to floor. RN received at bedside.     1800: wound care preformed     1845: Central line dressing changed using sterile procedure due.     Braden Score 12. All of the following interventions have been implemented to prevent pressure injury:    SKIN ASSESSMENT (S)  Dual skin assessment completed at shift change: Yes  Name of second RN who completed Dual Skin Assessment: John, RN  Picture of wound uploaded to EMR: Yes  Venelex ordered and given per protocol: Yes  Wound care consulted if wounds present: Yes    SURFACE (S)  Stryker air pump or specialty bed ordered: Yes  Type of bed: air bed  Only white chux used with specialty surfaces (no green chux used): Yes  Waffle cushion used for chair positioning: NA    KEEP MOVING (K)  Mobility status (Bedrest, Chairbound, UWA x 1 assist , 2 assist, Max assist): bedrest   Q2 hour turns documented: Yes  Refusals to turn and education provided documented: NA  Device used to float heels: heel lift and pillows  PT/OT consulted: Yes    INCONTINENCE (I)  Incontinence status assessed Q2 hours: Yes  External catheter in use: n/a  Barrier cream in use: zinc    NUTRITION (N)  I/O's documented every 8 hours: Yes  Oral supplements ordered if appropriate: Yes  Nutrition services consulted: Yes    All concerns about new DTI's must be escalated directly to attending MD, charge nurse, [place] and nurse director.     End of Shift Note    Bedside shift change report given to RN (oncoming nurse) by Atlee Abide, RN (offgoing nurse).  Report included the following information SBAR, Kardex, Intake/Output, MAR, and Recent  Results    Shift worked:  0700-1900     Shift summary and any significant changes:     See above     Concerns for physician to address:  N/a     Zone phone for oncoming shift:          Activity:     Number times ambulated in hallways past shift: 0  Number of times OOB to chair past shift: 0    Cardiac:   Cardiac Monitoring: Yes           Access:  Current line(s): central line and HD access     Genitourinary:   Urinary status: voiding    Respiratory:      Chronic home O2 use?: NO  Incentive spirometer at bedside: N/A       GI:     Current diet:  ADULT ORAL NUTRITION SUPPLEMENT; Breakfast, Dinner; Low Calorie/High Protein Oral Supplement  ADULT DIET; Regular; 5 carb choices (75 gm/meal); NO iced tea. Vanilla Ensure high protein preferred  Passing flatus: YES  Tolerating current diet: YES       Pain Management:   Patient states pain is manageable on current regimen: YES    Skin:     Interventions: turn team, specialty bed, float heels, foam dressing, PT/OT consult, limit briefs, internal/external urinary devices, and nutritional support    Patient  Safety:  Fall Score:    Interventions: bed/chair alarm, assistive device (walker, cane. etc), gripper socks, pt to call before getting OOB, and stay with me (per policy)       Length of Stay:  Expected LOS: 61  Actual LOS: 58      Aarica Wax MEAD, RN

## 2022-07-29 NOTE — Progress Notes (Signed)
Please call CT when pt is off dialysis and transport can be sent back up for pt  [phone]

## 2022-07-29 NOTE — Other (Signed)
07/29/22 1119   Treatment   Time Off 1100   Treatment Goal UF goal decreased at beginning of HD per [facility].B.A.R. due to hypotensions and requiring albumin administration   Observations & Evaluations   Level of Consciousness 0   Oriented X 4   Heart Rhythm Other (Comment)  (remotely monitored)   Respiratory Quality/Effort Unlabored   O2 Device None (Room air)   Bilateral Breath Sounds Clear   Skin Color Pale   Skin Condition/Temp Warm   Appetite Fair   Abdomen Inspection Soft   RLE Edema Trace   LLE Edema Trace   Vital Signs   BP (!) 125/58   Temp 97.8 F (36.6 C)   Pulse 88   Respirations 16   Pain Assessment   Pain Assessment None - Denies Pain   Tunneled Hemodialysis Catheter Right Subclavian   Placement Date: 07/01/22   Present on Admission/Arrival: No  Inserted by: Hope Riddell-NP  Insertion Practices: Chlorohexadine skin antisepsis;Maximal barrier precautions;Sterile ultrasound technique;Optimal catheter site selection;Hand hygiene  Orien...   Access Status  Accessed   Continued need for line? Yes   Site Assessment Clean, dry & intact   Venous Lumen Status Blood return noted;Brisk blood return;Capped;Heparin locked   Arterial Lumen Status Blood return noted;Brisk blood return;Flushed;Heparin locked;Capped   Alcohol Cap Used No  (hospital provided dialysis caps)   Line Care Cap changed;Ports disinfected   Dressing Type Antimicrobial;Transparent   Date of Last Dressing Change 07/29/22   Dressing Status Clean, dry & intact   Dressing Change Due 07/31/22   Post-Hemodialysis Assessment   Post-Treatment Procedures Blood returned;Catheter capped, clamped and heparinized x 2 ports   Herbalist   Rinseback Volume (ml) 300 ml   Blood Volume Processed (Liters) 67.5 L   Dialyzer Clearance Lightly streaked   Duration of Treatment (minutes) 180 minutes   Heparin Amount Administered During Treatment (mL) 0 mL   Hemodialysis Intake (ml) 500 ml   Hemodialysis Output (ml) 1000 ml    NET Removed (ml) 500   Tolerated Treatment Fair  (albumin adminsitration and decrease UF required at beginning of treatment with Johnnette Litter, RN. TOlerated remainder of treatment well)   Interventions Taken Ultrafiltration goal decreased  (albumin)   Physician Notified Yes   Patient Disposition Return to room     Primary RN SBAR: Fenton Foy, RN  Comments: HD tolerated Fair with albumin and decrease UF goal at beginning of treatment

## 2022-07-29 NOTE — Plan of Care (Signed)
Problem: Safety - Adult  Goal: Free from fall injury  07/29/2022 1349 by Atlee Abide, RN  Outcome: Progressing  07/29/2022 1348 by Atlee Abide, RN  Outcome: Progressing     Problem: Respiratory - Adult  Goal: Achieves optimal ventilation and oxygenation  07/29/2022 1349 by Atlee Abide, RN  Outcome: Progressing  07/29/2022 1348 by Atlee Abide, RN  Outcome: Progressing     Problem: Pain  Goal: Verbalizes/displays adequate comfort level or baseline comfort level  07/29/2022 1349 by Atlee Abide, RN  Outcome: Progressing  07/29/2022 1348 by Atlee Abide, RN  Outcome: Progressing     Problem: Discharge Planning  Goal: Discharge to home or other facility with appropriate resources  07/29/2022 1349 by Atlee Abide, RN  Outcome: Progressing  07/29/2022 1348 by Atlee Abide, RN  Outcome: Progressing     Problem: Chronic Conditions and Co-morbidities  Goal: Patient's chronic conditions and co-morbidity symptoms are monitored and maintained or improved  07/29/2022 1349 by Atlee Abide, RN  Outcome: Progressing  07/29/2022 1348 by Atlee Abide, RN  Outcome: Progressing     Problem: Neurosensory - Adult  Goal: Achieves maximal functionality and self care  Outcome: Progressing     Problem: Cardiovascular - Adult  Goal: Maintains optimal cardiac output and hemodynamic stability  Outcome: Progressing     Problem: Safety - Medical Restraint  Goal: Remains free of injury from restraints (Restraint for Interference with Medical Device)  Description: INTERVENTIONS:  1. Determine that other, less restrictive measures have been tried or would not be effective before applying the restraint  2. Evaluate the patient's condition at the time of restraint application  3. Inform patient/family regarding the reason for restraint  4. Q2H: Monitor safety, psychosocial status, comfort, nutrition and hydration  07/29/2022 1349 by Atlee Abide, RN  Outcome: Progressing  07/29/2022 1348 by Atlee Abide, RN  Outcome: Progressing

## 2022-07-29 NOTE — Care Coordination-Inpatient (Addendum)
Transition of Care Plan:     RUR:  %  Prior Level of Functioning: independent  Disposition: [place]  If SNF or IPR: Date FOC offered: 06/30/22  Date FOC received:   Accepting facility: pending - need further HD info     Date authorization started with reference number:      Date authorization received and expires:   Follow up appointments: new PCP and Specialist in [place] (ID, nephrology, podiatry)  DME needed:   Transportation at discharge: [place] transport  IM/IMM Medicare/Tricare letter given:   Is patient a Veteran and connected with VA?               If yes, was [employer] transfer form completed and VA notified?   Caregiver Contact: Rithy Mandley - spouse - [phone]  Discharge Caregiver contacted prior to discharge? yes  Care Conference needed? no  Barriers to discharge: final IV Antibiotic recommendations, HD setup, PT/OT once stable for insurance authorization, need 2728 form completed  - left on bedside chart but also can email or fax form to nephrology        Patient off floor for HD - spoke with [place] wife - no answer from [facility] SNF in [place]     Wife agreeable to moving forward with [facility] SNF- this is the only facility that has in house HD at this time where transportation would not be a barrier - received 2728 form via email from liaison that they will require before accepting patient.  CM sent perfect serve to Dr. Lenore Manner to request completion      Per MD via IDR's patient is on IV antibiotics until 07/31/22 then re-eval to see if his drain ?clamped can be removed or further tx needed.       PT/OT continue to work with patient and these notes are appreciated so authorization can be obtained along with updated wound care notes. Chales Abrahams, MSW

## 2022-07-29 NOTE — Progress Notes (Signed)
Hospitalist Progress Note    NAME:   Jaime [name]   DOB: 1946-07-26   MRN: [id number]     Date/Time: 07/29/2022 10:39 PM  Patient PCP: No primary care provider on file.    Estimated discharge date: 09/28  Barriers: need placement     For reference :   76 y/m with prolonged hospitalization, was admitted to surgical service on 7/31 for perforated hollow viscus underwent emergent laparotomy/ septic shock d/t peritonitis, was intubated and extubated x 2. Also had AKI needing CVVH, now on HD. Also has bilateral foot gangrene. Was transferred to hospitalist service on 8/21.     Assessment / Plan:  #Bilateral lower extremity critical limb ischemia  Gangrene both feet  -Podiatry and vascular surgery   Podiatry evaluation   S/p transmetatarsal amputation right foot and left foot  Clear to d/c by podiatry           #Septic shock- resolved   D/t perforated viscus  S/p Laparotomy 7/31, with wound vac drainage   Liver abscess s/p IR drainage, hepatic drain to be removed  Bacteremia     -Discussed with case management regarding discharge disposition - wife needs to set up financial assistance for transfer of patient to [place] . Other option could be to go to an SNF in [place] [facility] ) .    Continue ceftriaxone 2 g IV daily   Antibiotic coverage extended to 8 weeks end date   9/29 given persistence of abscess on repeat CT.  Empty drain q shift please  Pt will require repeat imaging of liver before conclusion of therapy    Repeat CT scan show decrease abscess will need IR follow up regarding hepatic drain   ID follow up               #Acute hypoxemic respiratory failure:  Intubated 7/31, extubation 8/7  -Reintubated 8/13, extubated on 8/19  -Currently on room air      #Dysphagia:  -resolved      #Acute kidney injury:  -Now on HD  -Continue with HD  -Nephrology following  -Case management informed the need to set up HD at SNF prior to discharge .            #Diabetes mellitus:  -Sliding scale and lantus  -Monitor FS  -Diabetes team  following     #Sacral wound:  -Wound care following    Pending placement        Medical Decision Making:   I personally reviewed labs: CBC and BMP  I personally reviewed imaging:CT abdomen   I personally reviewed EKG:  Toxic drug monitoring:   Discussed case with: Patient , RN        Code Status: Full   DVT Prophylaxis: Heparin   GI Prophylaxis:    Subjective:     Chief Complaint / Reason for Physician Visit  "Follow up case. No active complaints. ".  Discussed with RN events overnight.       Objective:     VITALS:   Last 24hrs VS reviewed since prior progress note. Most recent are:  Patient Vitals for the past 24 hrs:   BP Temp Temp src Pulse Resp SpO2   07/29/22 1957 (!) 140/65 98.1 F (36.7 C) Oral 97 18 96 %   07/29/22 1510 123/63 97.8 F (36.6 C) Oral 88 18 100 %   07/29/22 1245 (!) 128/54 98.1 F (36.7 C) Oral 86 18 98 %   07/29/22 1119 (!) 125/58 97.8  F (36.6 C) -- 88 16 --   07/29/22 1100 124/60 -- -- 84 -- --   07/29/22 1045 (!) 117/57 -- -- 87 -- --   07/29/22 1015 126/62 -- -- 84 -- --   07/29/22 1000 (!) 114/58 -- -- 82 -- --   07/29/22 0945 118/61 -- -- 87 -- --   07/29/22 0930 131/88 -- -- 85 -- --   07/29/22 0915 (!) 108/56 -- -- 84 -- --   07/29/22 0900 (!) 112/56 -- -- 85 -- --   07/29/22 0845 (!) 108/55 -- -- 85 -- --   07/29/22 0830 (!) 100/54 -- -- 89 -- --   07/29/22 0815 (!) 81/45 -- -- 92 -- --   07/29/22 0800 122/63 98 F (36.7 C) -- 86 16 --   07/29/22 0730 122/62 98.1 F (36.7 C) Oral 92 18 98 %   07/29/22 0327 132/61 97.9 F (36.6 C) -- 91 18 98 %   07/28/22 2332 112/63 97.9 F (36.6 C) Oral 97 18 96 %           Intake/Output Summary (Last 24 hours) at 07/29/2022 2239  Last data filed at 07/29/2022 1347  Gross per 24 hour   Intake 500 ml   Output 1000 ml   Net -500 ml              I had a face to face encounter and independently examined this patient on 07/29/2022, as outlined below:  PHYSICAL EXAM:  General: Alert, cooperative  EENT:  EOMI. Anicteric sclerae..Rt. hd catn - jugular    Resp:  CTA bilaterally, no wheezing or rales.  No accessory muscle use  CV:  Regular  rhythm,  No edema  GI:  Soft, Non distended, Non tender.  +Bowel sounds, wound vac in place , drain on rt upper quadrant   Neurologic:  Alert and oriented X 3, normal speech,   Psych:   Good insight. Not anxious nor agitated  Foot :  bilateral feet - gangrene of most of the toes  , not yet full demarcation     Reviewed most current lab test results and cultures  YES  Reviewed most current radiology test results   YES  Review and summation of old records today    NO  Reviewed patient's current orders and MAR    YES  PMH/SH reviewed - no change compared to H&P  ________________________________________________________________________  Care Plan discussed with:    Comments   Patient x    Family      RN x    Care Manager     Consultant                        Multidiciplinary team rounds were held today with case manager, nursing, pharmacist and Occupational psychologist.  Patient's plan of care was discussed; medications were reviewed and discharge planning was addressed.     ________________________________________________________________________  Total NON critical care TIME:  25  Minutes    Total CRITICAL CARE TIME Spent:   Minutes non procedure based      Comments   >50% of visit spent in counseling and coordination of care     ________________________________________________________________________  Antionette Fairy, MD     Procedures: see electronic medical records for all procedures/Xrays and details which were not copied into this note but were reviewed prior to creation of Plan.      LABS:  I reviewed today's most current labs  and imaging studies.  Pertinent labs include:  Recent Labs     07/27/22  0101 07/29/22  0235   WBC 12.2* 12.3*   HGB 7.8* 7.7*   HCT 24.9* 25.0*   PLT 186 211         Recent Labs     07/27/22  0101 07/29/22  0235   NA 138 137   K 3.9 3.8   CL 104 102   CO2 30 26   GLUCOSE 126* 71   BUN 36* 26*   CREATININE 3.66*  3.19*   CALCIUM 8.4* 8.4*           Signed: Terence Lux, MD

## 2022-07-29 NOTE — Care Coordination-Inpatient (Signed)
CM notified nursing and therapy during IDRs to continue helping patient move while CM is working to secure SNF placement.      Lucille Passy, BSN, RN    Care Management  [phone]

## 2022-07-29 NOTE — Plan of Care (Signed)
Problem: Safety - Adult  Goal: Free from fall injury  Outcome: Progressing     Problem: Respiratory - Adult  Goal: Achieves optimal ventilation and oxygenation  Outcome: Progressing     Problem: Pain  Goal: Verbalizes/displays adequate comfort level or baseline comfort level  Outcome: Progressing     Problem: Discharge Planning  Goal: Discharge to home or other facility with appropriate resources  Outcome: Progressing     Problem: Chronic Conditions and Co-morbidities  Goal: Patient's chronic conditions and co-morbidity symptoms are monitored and maintained or improved  Outcome: Progressing     Problem: Neurosensory - Adult  Goal: Achieves maximal functionality and self care  Outcome: Progressing     Problem: Cardiovascular - Adult  Goal: Maintains optimal cardiac output and hemodynamic stability  Outcome: Progressing     Problem: Safety - Medical Restraint  Goal: Remains free of injury from restraints (Restraint for Interference with Medical Device)  Description: INTERVENTIONS:  1. Determine that other, less restrictive measures have been tried or would not be effective before applying the restraint  2. Evaluate the patient's condition at the time of restraint application  3. Inform patient/family regarding the reason for restraint  4. Q2H: Monitor safety, psychosocial status, comfort, nutrition and hydration  Outcome: Progressing

## 2022-07-29 NOTE — Other (Signed)
07/29/22 0800   Treatment   Time On 0800   Treatment Goal 0-1kg in 3hr   Observations & Evaluations   Level of Consciousness 0   Oriented X 4   Heart Rhythm Regular   Respiratory Quality/Effort Unlabored   O2 Device None (Room air)   Skin Condition/Temp Warm;Dry   Bowel Sounds (All Quadrants) Present   Edema Generalized Trace   Vital Signs   BP 122/63   Temp 98 F (36.7 C)   Pulse 86   Respirations 16   Pain Assessment   Pain Assessment None - Denies Pain   Technical Checks   Dialysis Machine No. 03   RO Machine Number [id number]   Dialyzer Lot No. [id number]   [place]ing Lot Number [id number]   All Connections Secure Yes   NS Bag Yes   Saline Line Double Clamped Yes   Dialyzer Revaclear 300   ICEBOAT I;C;E;B;O;A;T   RO Machine Log Sheet Completed Yes   Machine Alarm Self Test Completed;Passed   Materials engineer Function   Extracorporeal Advertising copywriter Conductivity 13.6   Manual Conductivity 13.6   Machine Ph 7.4   Bleach Test (Neg) Yes   Bath Temperature 96.8 F (36 C)   Tunneled Hemodialysis Catheter Right Subclavian   Placement Date: 07/01/22   Present on Admission/Arrival: No  Inserted by: Hope Riddell-NP  Insertion Practices: Chlorohexadine skin antisepsis;Maximal barrier precautions;Sterile ultrasound technique;Optimal catheter site selection;Hand hygiene  Orien...   Access Status  Accessed   Continued need for line? Yes   Site Assessment Clean, dry & intact   Venous Lumen Status Brisk blood return;Flushed;Infusing   Arterial Lumen Status Brisk blood return;Flushed;Infusing   Alcohol Cap Used No   Line Care Ports disinfected;Connections checked and tightened   Dressing Type Bacteriocidal;Transparent   Date of Last Dressing Change 07/29/22   Dressing Status Clean, dry & intact   Dressing Change Due 07/31/22   Treatment Initiation   Dialyze Hours 3   Treatment  Initiation Universal Precautions maintained;Lines secured to patient;Connections secured;Prime given;Venous Parameters  set;Arterial Parameters set;Saline line double clamped   During Hemodialysis Assessment   Blood Flow Rate (ml/min) 400 ml/min   Arterial Pressure (mmHg) -160 mmHg   Venous Pressure (mmHg) 130   TMP 50   DFR 600   Comments Treatment initiated   Access Visible Yes   Ultrafiltration Rate (ml/hr) 500 ml/hr   Ultrafiltration Removed (mL) 0 ml/min   Dialysis Bath   K+ (Potassium) 3   Ca+ (Calcium) 2.5   Na+ (Sodium) 140   HCO3 (Bicarb) 35   Primary RN SBAR: John, RN  Patient Education: procedural; Albumin for hypotension  Hepatitis B Surface Ag   Date/Time Value Ref Range Status   07/19/2022 06:10 PM <0.10 Index Final     Hep B S Ab   Date/Time Value Ref Range Status   06/29/2022 09:53 AM <3.10 mIU/mL Final

## 2022-07-30 ENCOUNTER — Inpatient Hospital Stay: Admit: 2022-07-30 | Payer: MEDICARE

## 2022-07-30 LAB — POCT GLUCOSE
POC Glucose: 129 mg/dL — ABNORMAL HIGH (ref 65–117)
POC Glucose: 113 mg/dL (ref 65–117)
POC Glucose: 175 mg/dL — ABNORMAL HIGH (ref 65–117)
POC Glucose: 110 mg/dL (ref 65–117)

## 2022-07-30 MED FILL — THERA M PLUS PO TABS: ORAL | Qty: 1

## 2022-07-30 MED FILL — HEPARIN SODIUM (PORCINE) 5000 UNIT/ML IJ SOLN: 5000 UNIT/ML | INTRAMUSCULAR | Qty: 1

## 2022-07-30 MED FILL — RENVELA 2.4 G PO PACK: 2.4 g | ORAL | Qty: 2.4

## 2022-07-30 MED FILL — AMIODARONE HCL 200 MG PO TABS: 200 MG | ORAL | Qty: 1

## 2022-07-30 MED FILL — MIRTAZAPINE 15 MG PO TABS: 15 MG | ORAL | Qty: 1

## 2022-07-30 MED FILL — RISAQUAD-2 PO CAPS: ORAL | Qty: 1

## 2022-07-30 MED FILL — CEFTRIAXONE SODIUM 2 G IJ SOLR: 2 g | INTRAMUSCULAR | Qty: 2000

## 2022-07-30 NOTE — Progress Notes (Signed)
Drain removed by Lorelee Cover PA removed drain. Patient tolerated drain removal well and no complaints of pain. Flowsheet updated.

## 2022-07-30 NOTE — Plan of Care (Signed)
Problem: Safety - Adult  Goal: Free from fall injury  Outcome: Progressing     Problem: Respiratory - Adult  Goal: Achieves optimal ventilation and oxygenation  Outcome: Progressing     Problem: Pain  Goal: Verbalizes/displays adequate comfort level or baseline comfort level  Outcome: Progressing     Problem: Discharge Planning  Goal: Discharge to home or other facility with appropriate resources  Outcome: Progressing  Flowsheets  Taken 07/30/2022 0800 by Dia Crawford, RN  Discharge to home or other facility with appropriate resources: Identify barriers to discharge with patient and caregiver  Taken 07/29/2022 2341 by Mattie Marlin, RN  Discharge to home or other facility with appropriate resources: Identify barriers to discharge with patient and caregiver

## 2022-07-30 NOTE — Progress Notes (Signed)
End of Shift Note    Bedside shift change report given to Mardella Layman (Soil scientist) by Dia Crawford, RN (offgoing nurse).  Report included the following information SBAR, Kardex, Intake/Output, MAR, Recent Results, and Cardiac Rhythm NSR    Shift worked:  [phone]     Shift summary and any significant changes:     Uneventful shift, no complaints of pain    Drain removed by IR, Wound care complete     Concerns for physician to address:       Zone phone for oncoming shift:          Activity:     Number times ambulated in hallways past shift: 0  Number of times OOB to chair past shift: 0    Cardiac:   Cardiac Monitoring: Yes           Access:  Current line(s): PIV     Genitourinary:   Urinary status: incontinent and external catheter    Respiratory:      Chronic home O2 use?: NO  Incentive spirometer at bedside: YES       GI:     Current diet:  ADULT ORAL NUTRITION SUPPLEMENT; Breakfast, Dinner; Low Calorie/High Protein Oral Supplement  ADULT DIET; Regular; 5 carb choices (75 gm/meal); NO iced tea. Vanilla Ensure high protein preferred  Passing flatus: YES  Tolerating current diet: YES       Pain Management:   Patient states pain is manageable on current regimen: YES    Skin:     Interventions: specialty bed, float heels, increase time out of bed, foam dressing, PT/OT consult, limit briefs, and internal/external urinary devices    Patient Safety:  Fall Score:    Interventions: bed/chair alarm, assistive device (walker, cane. etc), gripper socks, pt to call before getting OOB, and stay with me (per policy)       Length of Stay:  Expected LOS: 61  Actual LOS: [address], RN

## 2022-07-30 NOTE — Progress Notes (Signed)
Infectious Disease Progress    Impression      Hepatic abscess  Gas and fluid containing collection 7.1 x 10.8 x 5.4 cm  In anterior left lobe of liver, patchy areas of hypodensity right lobe  4 x 4.5 x 5.5 cm collection  S/p drainage by IR 8/4.Cultures + for  few E.coli  CT abdomen/pelvis 9/27+A percutaneous catheter is again noted with the tip in the anterior left hepatic  lobe.  The tip is in the collection which measures approximately 3.9 x 3.3 cm. This has decreased, previously measuring 5.8 x 2.3 cm.  The borders of the collection are not well delineated.    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  Both feet are healing    Sepsis  Septic shock.  Resolved    E. coli bacteremia  Blood cultures 7/31+ for E. coli   2/2 LAC (pan sensitive)   Negative repeat cultures 8/4   Treated.    Acute abdomen  Pneumoperitoneum  S/p diagnostic laparoscopy, laparotomy  EGD 7/31  Findings of cloudy fluid in right upper quadrant, yellow/white  Inflammatory peel over lesser curve of stomach, inferior lobe of liver  No gross perforation identified  Pancreas and retroperitoneum appeared edematous  Intra-Op fluid culture + for E. Coli (pansensitive)  Ct abd/ pelvis 9/27+ for  percutaneous catheter is noted with the tip   in the anterior left hepatic lobe.  The tip is in the collection which measures   approximately 3.9 x 3.3 cm. This has decreased, previously measuring   5.8 x 2.3 cm.  The borders of the  collection are not well delineated.      S/p abdominal wound dehiscence  Clean, wound is healing  Cultures 8/17, 8/18-NG.    Leukocytosis  WBC 12.1    Acute hypoxic respiratory failure  Re intubated 8/13, extubated 8/19  Initially intubated 7/31, s/p extubation 8/7  CXR  8/20 + + for atelectasis  Resp cultures  8/13+ for light yeast  Apparent C.albicans/ dubliensis        AKI  Cr 3.19 on HD    Coagulopathy  Improving    Thrombocytopenia  resolved      Transaminitis  Improving    Hyperbilirubinemia    Improved      Diabetes type 2  Hyperglycemia  A1c 7.7    Diarrhea  resolved    Obesity  BMI 34.76  Plan  Continue ceftriaxone 2 g IV daily   Antibiotic coverage extended to 8 weeks end date 9/29   May transition to po Omnicef  thereafter for 2 weeks end date 10/13  Monitor wound healing of TM amputations  Empty drain q shift please.  D/w patient.            Extensive review of chart notes, labs, imaging, cultures done  Additionally review of done: Drain output      Patient seen  today.  Awake, denies new complaints.  Laying in bed.  Minimal drainage output noted in bag.      Past Medical History:   Diagnosis Date    Diabetes mellitus ([place])     Hypertension        Past Surgical History:   Procedure Laterality Date    BLADDER SURGERY N/A 06/01/2022    ENDOSCOPY OF ILEAL CONDUIT performed by Melene Plan, MD at [facility]    CT VISCERAL PERCUTANEOUS DRAIN  06/05/2022    CT VISCERAL PERCUTANEOUS DRAIN 06/05/2022 [facility] CT  FOOT DEBRIDEMENT Bilateral 07/23/2022    BILATERAL TRANSMETATARSAL AMPUTATION performed by Rosana Fret, DPM at [facility]    IR TUNNELED CATHETER PLACEMENT GREATER THAN 5 YEARS  07/01/2022    IR TUNNELED CATHETER PLACEMENT GREATER THAN 5 YEARS 07/01/2022 [facility], APRN - NP [facility] ANGIO IR    LAPAROSCOPY N/A 06/01/2022    LAPAROSCOPY DIAGNOSTIC performed by Guinevere Ferrari, MD at [facility]    LAPAROTOMY N/A 06/01/2022    LAPAROTOMY EXPLORATORY performed by Guinevere Ferrari, MD at [facility]       Allergies   Allergen Reactions    Augmentin [Amoxicillin-Pot Clavulanate] Hives     Tolerated ceftriaxone 06/2022    Codeine      Unknown reaction    Oxycodone Itching       Social Connections: Not on file       No family status information on file.           Review of Systems - Negative except those mentioned in H&P      PHYSICAL EXAM:  General:          Awake, in no distress  EENT:              EOMI. Anicteric sclerae. MMM  Resp:               CTA bilaterally, no wheezing or rales.  No accessory  muscle use  CV:                  Regular  rhythm,  No edema  GI:                   Soft, Non distended, Non tender.  +Bowel sounds  Neurologic:      Alert and oriented X 3, normal speech,   Psych:             Good insight. Not anxious nor agitated  Skin:                No rashes.  No jaundice.  Extremities :  No edema, discoloration of  toes++.   Drain+ greenish drainage.    Waymon Amato, MD Jerrel Ivory

## 2022-07-30 NOTE — Progress Notes (Signed)
Name: Jaime [name]   MRN: [id number]  DOB: [birth year]  07/30/2022 11:47 AM      Admit Date: 05/31/2022    Admit Diagnosis: Septicemia (HCC) [A41.9]  Gastric perforation (HCC) [K25.5]  Perforated abdominal viscus [R19.8]    Assessment/Plan:     Principal Problem:    Gastric perforation (HCC)  Active Problems:    Poorly controlled type 2 diabetes mellitus (HCC)    Intestinal obstruction (HCC)    Severe sepsis (HCC)    Septic shock (HCC)    Escherichia coli septicemia (HCC)    Liver abscess    Pneumoperitoneum    Acute respiratory failure with hypoxia (HCC)    AKI (acute kidney injury) (HCC)    Multi-organ failure with heart failure (HCC)    Thrombocytopenia (HCC)    E coli bacteremia    Hepatic abscess    Peripheral arterial disease (HCC)    Hyperbilirubinemia    Gram negative sepsis (HCC)    Septicemia (HCC)    Aspiration pneumonia of both lower lobes (HCC)    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  Patient being treated with medication that requires intensive monitoring due to increased risk for toxicity. I have reviewed patient's presenting subjective and objective findings, as well as all laboratory studies, imaging studies, and vital signs to date as well as treatment rendered and patient's response to those treatments.  In addition, prior medical, surgical and relevant social and family histories were reviewed.        Discussion:     KRT initiated 7/31; now on  MWF schedule; perm cath 8/30.   Needs outpatient HD placement under ESKD diagnosis - CM working on options (here or back in [place])  Filled out 2728 form  H/H not at goal.  Continue ESA.        Plan:     Dialysis MWF schedule  1-2 L fluid removal tomorrow.      Bryan Lemma, MD    Dr Dorthey Sawyer  Cell no- [phone]  Available on perfect serve.      Signed By: Bryan Lemma, MD     July 30, 2022         Subjective:   Hospital day 59   No new complaints to me.  Seen and examined   Review of Systems:  Pertinent items are noted in the History of Present Illness.   Objective:    BP (!) 118/52   Pulse 93   Temp 98 F (36.7 C) (Oral)   Resp 18   Ht 1.727 m (5\' 8" )   Wt 91.9 kg (202 lb 9.6 oz)   SpO2 98%   BMI 30.81 kg/m     Physical Exam:  Physical Exam  Vitals and nursing note reviewed.   Constitutional:       Appearance: Normal appearance.   HENT:  Head: Normocephalic and atraumatic.      Nose: Nose normal.      Mouth/Throat:      Mouth: Mucous membranes are moist.   Cardiovascular:      Rate and Rhythm: Tachycardia present.      Pulses: Normal pulses.      Heart sounds: Normal heart sounds.   Pulmonary:      Effort: Pulmonary effort is normal.   Abdominal:      General: Abdomen is flat.   Musculoskeletal:      Cervical back: Neck supple.      Right lower leg: Edema present.      Left lower leg: Edema present.   Skin:     Coloration: Skin is pale.      Findings: Lesion and rash present.   Neurological:      General: No focal deficit present.      Mental Status: He is oriented to person, place, and time. Mental status is at baseline.      Abdominal abscess drain in place.    Recent Results (from the past 24 hour(s))   POCT Glucose    Collection Time: 07/29/22  4:03 PM   Result Value Ref Range    POC Glucose 88 65 - 117 mg/dL    Performed by: Charlsie Merles PCT    POCT Glucose    Collection Time: 07/29/22  9:31 PM   Result Value Ref Range    POC Glucose 129 (H) 65 - 117 mg/dL    Performed by:  Adele Schilder PCT    POCT Glucose    Collection Time: 07/30/22  8:00 AM   Result Value Ref Range    POC Glucose 113 65 - 117 mg/dL    Performed by: Charlsie Merles PCT    POCT Glucose    Collection Time: 07/30/22 11:36 AM   Result Value Ref Range    POC Glucose 175 (H) 65 - 117 mg/dL    Performed by: McAfee Kellie PCT           Intake/Output Summary (Last 24 hours) at 07/30/2022 1147  Last data filed at 07/30/2022 0800  Gross per 24 hour   Intake 0 ml   Output 300 ml   Net -300 ml            Data Review:   Recent Labs     07/29/22  0235   NA 137   K 3.8   BUN 26*   CREATININE 3.19*   WBC 12.3*   HGB 7.7*   HCT 25.0*   PLT 211         No current facility-administered medications on file prior to encounter.     Current Outpatient Medications on File Prior to Encounter   Medication Sig Dispense Refill    metFORMIN (GLUCOPHAGE) 500 MG tablet Take 1 tablet by mouth daily (Patient not taking: Reported on 06/08/2022)      metoprolol (LOPRESSOR) 100 MG tablet Take 1 tablet by mouth daily      hydrALAZINE (APRESOLINE) 25 MG tablet Take 1 tablet by mouth 3 times daily (Patient not taking: Reported on 06/08/2022)      atorvastatin (LIPITOR) 40 MG tablet Take 1 tablet by mouth daily (Patient not taking: Reported on 06/08/2022)      losartan (COZAAR) 100 MG tablet Take 1 tablet by mouth daily (Patient not taking: Reported on 06/08/2022)      metFORMIN (GLUCOPHAGE-XR) 500 MG extended release tablet Take 1 tablet by mouth  daily (with breakfast) (Patient not taking: Reported on 06/08/2022)      hydroCHLOROthiazide (HYDRODIURIL) 25 MG tablet TAKE 1 TABLET BY MOUTH ONCE DAILY AS NEEDED (Patient not taking: Reported on 06/08/2022)      metoprolol succinate (TOPROL XL) 100 MG extended release tablet TAKE 1 TABLET BY MOUTH ONCE DAILY WITH OR IMMEDIATELY FOLLOWING A MEAL          Full Code  Care Plan discussed with:  Patient x    Family      RN     Dialysis RN     Care Manager x       Hospitalist    Consultant:            Comments   >50% of visit  spent in counseling and coordination of care y          Signed By: Caren Griffins, MD     July 30, 2022       This dictation was done by dragon, computer voice recognition software.  Often unanticipated grammatical, syntax, phones and other interpretive errors are inadvertently transcribed.  Please excuse errors that have escaped final proofreading. Please contact me if you suspect dictation or transcription errors.      Dr Elenor Legato    Office No- [phone]  [facility]  [address]  [address] [place] [address]    Fax: [phone]

## 2022-07-30 NOTE — Progress Notes (Signed)
1900 Bedside and Verbal shift change report given to Mercie Eon RN (oncoming nurse) by Nira Conn RN (offgoing nurse). Report included the following information Nurse Handoff Report, MAR, Recent Results, and Cardiac Rhythm NSR .

## 2022-07-30 NOTE — Progress Notes (Signed)
Hospitalist Progress Note    NAME:   Jaime [name]   DOB: 12/28/1945   MRN: [id number]     Date/Time: 07/30/2022 2:44 PM  Patient PCP: No primary care provider on file.    Estimated discharge date: 10/2?  Barriers: [facility] once arranged    For reference :   76 y/m with prolonged hospitalization, was admitted to surgical service on 7/31 for perforated hollow viscus underwent emergent laparotomy/ septic shock d/t peritonitis, was intubated and extubated x 2. Also had AKI needing CVVH, now on HD. Also has bilateral foot gangrene. Was transferred to hospitalist service on 8/21.     Assessment / Plan:  #Bilateral lower extremity critical limb ischemia  Gangrene both feet  -Podiatry and vascular surgery   Podiatry evaluation   S/p transmetatarsal amputation right foot and left foot  Clear to d/c by podiatry   S/p IV Abx for 8 weeks ending on 9/29 (tomorrow)-p.o. Omnicef for 2 more weeks as per ID recommendations      #Septic shock- resolved   D/t perforated viscus  S/p Laparotomy 7/31, with wound vac drainage   Liver abscess s/p IR drainage, hepatic drain to be removed  Bacteremia     -Discussed with case management regarding discharge disposition - wife needs to set up financial assistance for transfer of patient to [place] . Other option could be to go to an SNF in [facility] ) .    Continue ceftriaxone 2 g IV daily   Antibiotic coverage extended to 8 weeks end date 9/29 given persistence of abscess on repeat CT.  Plan to give PO Omnicef for 2 more weeks as per ID recommendations  Empty drain q shift please  Pt will require repeat imaging of liver before conclusion of therapy  Repeat CT scan show decrease abscess will need IR follow up regarding hepatic drain - IR consulted for possibility of drain removal before patient discharges to nursing home (day 60 of the hospital  ID follow up appreciated          #Acute hypoxemic respiratory failure:  Intubated 7/31, extubation 8/7  -Reintubated 8/13, extubated on  8/19  -Currently on room air      #Dysphagia:  -resolved      #Acute kidney injury:  -Now on HD  -Continue with HD  -Nephrology following  -Case management informed the need to set up HD at SNF prior to discharge -[facility] being considered as a rehab that has in-house dialysis not requiring any transportation            #Diabetes mellitus:  -Sliding scale and lantus  -Monitor FS  -Diabetes team following     #Sacral wound:  -Wound care following    Pending placement        Medical Decision Making:   I personally reviewed labs: CBC and BMP from yesterday  I personally reviewed imaging:CT abdomen   I personally reviewed EKG:  Toxic drug monitoring:   Discussed case with: Patient , RN, case management on IDR, nephrology Dr. Ena Dawley,         Code Status: Full   DVT Prophylaxis: Heparin   GI Prophylaxis:    Subjective:     Chief Complaint / Reason for Physician Visit  Follow-up for AKI now progressed to ESRD needing dialysis, diabetes, pancreatitis complicated by perforated viscus needing laparotomy and liver abscess requiring drain  "I am feeling fine".  Discussed with RN events overnight.       Objective:  VITALS:   Last 24hrs VS reviewed since prior progress note. Most recent are:  Patient Vitals for the past 24 hrs:   BP Temp Temp src Pulse Resp SpO2   07/30/22 1130 (!) 118/52 98 F (36.7 C) Oral 93 18 98 %   07/30/22 0852 121/67 98.2 F (36.8 C) Oral 88 16 99 %   07/30/22 0800 -- 98.2 F (36.8 C) -- -- -- --   07/30/22 0432 (!) 104/51 98.3 F (36.8 C) Oral 90 11 100 %   07/29/22 2341 124/69 99.6 F (37.6 C) Oral 95 16 98 %   07/29/22 1957 (!) 140/65 98.1 F (36.7 C) Oral 97 18 96 %   07/29/22 1510 123/63 97.8 F (36.6 C) Oral 88 18 100 %           Intake/Output Summary (Last 24 hours) at 07/30/2022 1444  Last data filed at 07/30/2022 0800  Gross per 24 hour   Intake --   Output 300 ml   Net -300 ml              I had a face to face encounter and independently examined this patient on 07/30/2022, as  outlined below:  PHYSICAL EXAM:  General: Alert, cooperative  EENT:  EOMI. Anicteric sclerae..Rt. hd catn - jugular   Resp:  CTA bilaterally, no wheezing or rales.  No accessory muscle use  CV:  Regular  rhythm,  No edema  GI:  Soft, Non distended, Non tender.  +Bowel sounds, wound vac in place , drain on rt upper quadrant   Neurologic:  Alert and oriented X 3, normal speech,   Psych:   Good insight. Not anxious nor agitated  Foot :  bilateral feet - gangrene of most of the toes  , not yet full demarcation     Reviewed most current lab test results and cultures  YES  Reviewed most current radiology test results   YES  Review and summation of old records today    NO  Reviewed patient's current orders and MAR    YES  PMH/SH reviewed - no change compared to H&P  ________________________________________________________________________  Care Plan discussed with:    Comments   Patient x    Family      RN x    Care Manager x    Consultant  x Nephrology Dr.Damodar                    x Multidiciplinary team rounds were held today with case manager, nursing, pharmacist and clinical coordinator.  Patient's plan of care was discussed; medications were reviewed and discharge planning was addressed.     ________________________________________________________________________  Total NON critical care TIME:  36  Minutes    Total CRITICAL CARE TIME Spent:   Minutes non procedure based      Comments   >50% of visit spent in counseling and coordination of care     ________________________________________________________________________  Luther Bradley, MD     Procedures: see electronic medical records for all procedures/Xrays and details which were not copied into this note but were reviewed prior to creation of Plan.      LABS:  I reviewed today's most current labs and imaging studies.  Pertinent labs include:  Recent Labs     07/29/22  0235   WBC 12.3*   HGB 7.7*   HCT 25.0*   PLT 211         Recent Labs     07/29/22  0235   NA 137    K 3.8   CL 102   CO2 26   GLUCOSE 71   BUN 26*   CREATININE 3.19*   CALCIUM 8.4*           Signed: Lou Cal, MD

## 2022-07-30 NOTE — Care Coordination-Inpatient (Signed)
RUR:  %  Prior Level of Functioning: independent  Disposition: [facility]  If SNF or IPR: Date FOC offered: 06/30/22  Date FOC received:   Accepting facility: pending- [facility] reviewing HD forms - will need authorization from [facility]  Date authorization started with reference number:      Date authorization received and expires:   Follow up appointments: new PCP and Specialist in [place] vs [place] )ID, nephrology, podiatry)  DME needed:   Transportation at discharge: [facility] transport  IM/IMM Medicare/Tricare letter given: will need prior to discharge  Is patient a [place] and connected with [facility]?               If yes, was [employer] transfer form completed and VA notified?   Caregiver Contact: Mishawn Hemann - spouse - [phone]  Discharge Caregiver contacted prior to discharge? yes  Care Conference needed? no  Barriers to discharge: final IV Antibiotic recommendations, HD setup, PT/OT once stable for insurance authorization, updated wound care/podiatry note- CT tomorrow of abdomen      2728 form completed  by nephrology and signed by patient and sent to [facility] for review. Chales Abrahams, MSW

## 2022-07-30 NOTE — Progress Notes (Signed)
[facility]  PODIATRY & FOOT SURGERY      POD #7 s/p bilateral transmetatarsal amputations  Heel touch to B/L LE for transfers. Float heels off the bed for pressure ulcer prevention  Betadine WTD every other day to both feet  Abx per ID  Pt stable for DC or transfer from podiatry standpoint     Thank you!              Emmaly Leech A. Travonna Swindle, DPM, CWSP, [facility]  [address] [place] [address]  O: [phone]  F: [phone]     [facility] (Opening Sept 2023)  [address] [place] [address]  O: [phone]  F: [phone]     [facility]  [address], [place] [address]  O: [phone]  F: [phone]     * Available via [facility] 24/7

## 2022-07-30 NOTE — Plan of Care (Signed)
Problem: Physical Therapy - Adult  Goal: By Discharge: Performs mobility at highest level of function for planned discharge setting.  See evaluation for individualized goals.  Description: FUNCTIONAL STATUS PRIOR TO ADMISSION: Patient was independent and active without use of DME. 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 Denies home O2 use.     HOME SUPPORT PRIOR TO ADMISSION: The patient lived with wife however wife has her own medical issues and cannot provide physical assist.    Physical Therapy Goals  Re-evaluated 07/24/22 s/p bilat TMA, goals revised as patient is now NWB bilat LE's  1.  Patient will move from supine to sit and sit to supine, scoot up and down, and roll side to side in bed with mod assist  within 7 day(s).   2. Patient will sit EOB x 15 minutes with contact guard assist within 7 days.    3.  Patient will perform t-transfer bed<>chair Mod A within 7 days.    Re-assessed 07/21/22; goals remain appropriate  Re-assessed 07/14/22; goals remain appropriate  Re-Assessment 07/07/22 remain appropriate  1.  Patient will move from supine to sit and sit to supine, scoot up and down, and roll side to side in bed with mod assist  within 7 day(s).   2. Patient will sit EOB x 15 minutes with contact guard assist within 7 days.    3.  Patient will perform sit to stand with maximal assistance x2 within 7 day(s). - discontinued 07/24/22  4.  Patient will transfer from bed to chair and chair to bed via minimal lift equipment (best mover) within 7 day(s). - discontinued 07/24/22    Re-Assessment 06/29/22  1.  Patient will move from supine to sit and sit to supine, scoot up and down, and roll side to side in bed with maximal assist of 1 within 7 day(s).   2. Patient will sit EOB x 15 minutes with contact guard assist within 7 days.    3.  Patient will perform sit to stand with maximal assistance x2 within 7 day(s).  4.  Patient will transfer from bed to chair and chair to bed via minimal lift  equipment (best mover) within 7 day(s).    Reassessed 06/20/2022   1.  Patient will move from supine to sit and sit to supine, scoot up and down, and roll side to side in bed with maximal assist of 1 within 7 day(s).   2. Patient will sit EOB x 5 minutes with contact guard assist within 7 days.    3.  Patient will perform sit to stand with maximal assistance x2 within 7 day(s).  4.  Patient will transfer from bed to chair and chair to bed via minimal lift equipment (best mover) within 7 day(s).    Initiated 06/10/2022  1.  Patient will move from supine to sit and sit to supine, scoot up and down, and roll side to side in bed with contact guard assist within 7 day(s).   2. Patient will sit EOB x 10 minutes with supervision/set-up assist within 7 days.    2.  Patient will perform sit to stand with moderate assistance x2 within 7 day(s).  3.  Patient will transfer from bed to chair and chair to bed with moderate assistance x2 using the least restrictive device within 7 day(s).  4.  Patient will ambulate with moderate assistance x2 for 5 feet with the least restrictive device within 7 day(s).   Outcome: Progressing  PHYSICAL THERAPY TREATMENT    Patient: Jaime [name] (76 y.o. male)  Date: 07/30/2022  Diagnosis: Septicemia ([place]) [A41.9]  Gastric perforation ([place]) [K25.5]  Perforated abdominal viscus [R19.8] Gastric perforation ([place])  Procedure(s) (LRB):  BILATERAL TRANSMETATARSAL AMPUTATION (Bilateral) 7 Days Post-Op  Precautions:  (B heel WB s/p TMAs) Right Lower Extremity Weight Bearing:  (heel touch for transfers) Left Lower Extremity Weight Bearing:  (heel touch for transfers)                ASSESSMENT:  Patient continues to benefit from skilled PT services and is progressing towards goals. Pt received in bed, agreeable to PT session.  Pt requiring less physical assistance with supine to sit and able to sit eob and participate in BLE and core exercises.  He continues to demonstrate increased activity tolerance, noted  with less SOB and rest breaks.  Pt returned to supine in bed.  He did require A x 2 to scoot up toward the hob.  Continue to recommend IPR.         PLAN:  Patient continues to benefit from skilled intervention to address the above impairments.  Continue treatment per established plan of care.    Recommendation for discharge: (in order for the patient to meet his/her long term goals): Therapy 3 hours/day 5-7 days/week    Other factors to consider for discharge: patient's current support system is unable to meet their requirements for physical assistance, high risk for falls, not safe to be alone, concern for safely navigating or managing the home environment, and new weight bearing restrictions limiting activity or patient is unable to maintain    IF patient discharges home will need the following DME: continuing to assess with progress       SUBJECTIVE:   Patient stated, "thanks for coming to help me."    OBJECTIVE DATA SUMMARY:   Critical Behavior:          Functional Mobility Training:  Bed Mobility:  Bed Mobility Training  Bed Mobility Training: Yes  Overall Level of Assistance: Moderate assistance;Minimum assistance  Interventions: Verbal cues  Rolling: Stand-by assistance;Supervision  Supine to Sit: Minimum assistance;Moderate assistance;Additional time;Assist X1  Sit to Supine: Minimum assistance;Moderate assistance;Assist X1;Additional time  Scooting: Total assistance;Assist X2 (bed in trendelenburg,)  Transfers:  Transfer Training  Transfer Training: No  Balance:  Balance  Sitting: Impaired  Sitting - Static: Good (unsupported)  Sitting - Dynamic: Fair (occasional);Good (unsupported)     Activity Tolerance:   Good, Fair , requires rest breaks, and SpO2 stable on room air    After treatment:   Patient left in no apparent distress in bed, Call bell within reach, Bed/ chair alarm activated, Side rails x3, and Heels elevated for pressure relief      COMMUNICATION/EDUCATION:   The patient's plan of care was  discussed with: registered nurse    Patient Education  Education Given To: Patient  Education Provided: Home Exercise Program  Education Provided Comments: Heel WB BLE  Education Method: Verbal  Barriers to Learning: None  Education Outcome: Verbalized understanding      Bobbie Stack, PT  Minutes: 30

## 2022-07-30 NOTE — Progress Notes (Signed)
Chart reviewed for IR consult.

## 2022-07-31 LAB — CBC
Hematocrit: 24.6 % — ABNORMAL LOW (ref 36.6–50.3)
Hemoglobin: 7.7 g/dL — ABNORMAL LOW (ref 12.1–17.0)
MCH: 31.6 PG (ref 26.0–34.0)
MCHC: 31.3 g/dL (ref 30.0–36.5)
MCV: 100.8 FL — ABNORMAL HIGH (ref 80.0–99.0)
MPV: 10.4 FL (ref 8.9–12.9)
Nucleated RBCs: 0 PER 100 WBC
Platelets: 198 10*3/uL (ref 150–400)
RBC: 2.44 M/uL — ABNORMAL LOW (ref 4.10–5.70)
RDW: 15.3 % — ABNORMAL HIGH (ref 11.5–14.5)
WBC: 11.3 10*3/uL — ABNORMAL HIGH (ref 4.1–11.1)
nRBC: 0 10*3/uL (ref 0.00–0.01)

## 2022-07-31 LAB — BASIC METABOLIC PANEL
Anion Gap: 5 mmol/L (ref 5–15)
BUN: 26 MG/DL — ABNORMAL HIGH (ref 6–20)
Bun/Cre Ratio: 8 — ABNORMAL LOW (ref 12–20)
CO2: 32 mmol/L (ref 21–32)
Calcium: 8.7 MG/DL (ref 8.5–10.1)
Chloride: 98 mmol/L (ref 97–108)
Creatinine: 3.07 MG/DL — ABNORMAL HIGH (ref 0.70–1.30)
Est, Glom Filt Rate: 20 mL/min/{1.73_m2} — ABNORMAL LOW (ref >60)
Glucose: 113 mg/dL — ABNORMAL HIGH (ref 65–100)
Potassium: 3.5 mmol/L (ref 3.5–5.1)
Sodium: 135 mmol/L — ABNORMAL LOW (ref 136–145)

## 2022-07-31 LAB — POCT GLUCOSE
POC Glucose: 117 mg/dL (ref 65–117)
POC Glucose: 126 mg/dL — ABNORMAL HIGH (ref 65–117)

## 2022-07-31 MED ORDER — ALBUMIN HUMAN 25 % IV SOLN
25 % | INTRAVENOUS | Status: AC
Start: 2022-07-31 — End: 2022-07-31
  Administered 2022-07-31: 13:00:00 25 via INTRAVENOUS

## 2022-07-31 MED ORDER — HEPARIN SODIUM (PORCINE) 1000 UNIT/ML IJ SOLN
1000 UNIT/ML | INTRAMUSCULAR | Status: AC
Start: 2022-07-31 — End: 2022-07-31
  Administered 2022-07-31: 15:00:00 1800

## 2022-07-31 MED ORDER — CEFDINIR 300 MG PO CAPS
300 | Freq: Every day | ORAL | Status: DC
Start: 2022-07-31 — End: 2022-08-03
  Administered 2022-08-01 – 2022-08-02 (×2): 300 mg via ORAL

## 2022-07-31 MED FILL — AMIODARONE HCL 200 MG PO TABS: 200 MG | ORAL | Qty: 1

## 2022-07-31 MED FILL — RISAQUAD-2 PO CAPS: ORAL | Qty: 1

## 2022-07-31 MED FILL — MIRTAZAPINE 15 MG PO TABS: 15 MG | ORAL | Qty: 1

## 2022-07-31 MED FILL — CEFTRIAXONE SODIUM 2 G IJ SOLR: 2 g | INTRAMUSCULAR | Qty: 2000

## 2022-07-31 MED FILL — HEPARIN SODIUM (PORCINE) 5000 UNIT/ML IJ SOLN: 5000 UNIT/ML | INTRAMUSCULAR | Qty: 1

## 2022-07-31 MED FILL — HEPARIN SODIUM (PORCINE) 1000 UNIT/ML IJ SOLN: 1000 UNIT/ML | INTRAMUSCULAR | Qty: 10

## 2022-07-31 MED FILL — ALBUTEIN 25 % IV SOLN: 25 % | INTRAVENOUS | Qty: 100

## 2022-07-31 MED FILL — RETACRIT 3000 UNIT/ML IJ SOLN: 3000 UNIT/ML | INTRAMUSCULAR | Qty: 2

## 2022-07-31 MED FILL — SEVELAMER CARBONATE 2.4 G PO PACK: 2.4 g | ORAL | Qty: 2.4

## 2022-07-31 MED FILL — THERA M PLUS PO TABS: ORAL | Qty: 1

## 2022-07-31 NOTE — Plan of Care (Signed)
Problem: Safety - Adult  Goal: Free from fall injury  Outcome: Progressing     Problem: Respiratory - Adult  Goal: Achieves optimal ventilation and oxygenation  Outcome: Progressing  Flowsheets (Taken 07/30/2022 1915)  Achieves optimal ventilation and oxygenation: Assess for changes in respiratory status     Problem: Pain  Goal: Verbalizes/displays adequate comfort level or baseline comfort level  Outcome: Progressing     Problem: Discharge Planning  Goal: Discharge to home or other facility with appropriate resources  Outcome: Progressing     Problem: Chronic Conditions and Co-morbidities  Goal: Patient's chronic conditions and co-morbidity symptoms are monitored and maintained or improved  Outcome: Progressing     Problem: Neurosensory - Adult  Goal: Achieves maximal functionality and self care  Outcome: Progressing     Problem: Cardiovascular - Adult  Goal: Maintains optimal cardiac output and hemodynamic stability  Outcome: Progressing  Flowsheets (Taken 07/30/2022 1915)  Maintains optimal cardiac output and hemodynamic stability: Monitor blood pressure and heart rate  Goal: Absence of cardiac dysrhythmias or at baseline  Outcome: Progressing  Flowsheets (Taken 07/30/2022 1915)  Absence of cardiac dysrhythmias or at baseline: Monitor cardiac rate and rhythm     Problem: Gastrointestinal - Adult  Goal: Maintains or returns to baseline bowel function  Outcome: Progressing  Flowsheets (Taken 07/30/2022 1915)  Maintains or returns to baseline bowel function: Assess bowel function  Goal: Maintains adequate nutritional intake  Outcome: Progressing  Flowsheets (Taken 07/30/2022 1915)  Maintains adequate nutritional intake: Monitor percentage of each meal consumed     Problem: Genitourinary - Adult  Goal: Absence of urinary retention  Outcome: Progressing  Flowsheets (Taken 07/30/2022 1915)  Absence of urinary retention: Assess patient's ability to void and empty bladder     Problem: Metabolic/Fluid and Electrolytes -  Adult  Goal: Electrolytes maintained within normal limits  Outcome: Progressing  Goal: Hemodynamic stability and optimal renal function maintained  Outcome: Progressing  Goal: Glucose maintained within prescribed range  Outcome: Progressing     Problem: Skin/Tissue Integrity - Adult  Goal: Skin integrity remains intact  Outcome: Progressing  Goal: Incisions, wounds, or drain sites healing without S/S of infection  Outcome: Progressing  Goal: Oral mucous membranes remain intact  Outcome: Progressing     Problem: Hematologic - Adult  Goal: Maintains hematologic stability  Outcome: Progressing     Problem: Musculoskeletal - Adult  Goal: Return mobility to safest level of function  Outcome: Progressing  Goal: Maintain proper alignment of affected body part  Outcome: Progressing  Goal: Return ADL status to a safe level of function  Outcome: Progressing     Problem: Skin/Tissue Integrity  Goal: Absence of new skin breakdown  Description: 1.  Monitor for areas of redness and/or skin breakdown  2.  Assess vascular access sites hourly  3.  Every 4-6 hours minimum:  Change oxygen saturation probe site  4.  Every 4-6 hours:  If on nasal continuous positive airway pressure, respiratory therapy assess nares and determine need for appliance change or resting period.  Outcome: Progressing     Problem: ABCDS Injury Assessment  Goal: Absence of physical injury  Outcome: Progressing     Problem: Physical Therapy - Adult  Goal: By Discharge: Performs mobility at highest level of function for planned discharge setting.  See evaluation for individualized goals.  Description: FUNCTIONAL STATUS PRIOR TO ADMISSION: Patient was independent and active without use of DME. 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 Denies home O2 use.  HOME SUPPORT PRIOR TO ADMISSION: The patient lived with wife however wife has her own medical issues and cannot provide physical assist.    Physical Therapy Goals  Re-evaluated 07/24/22 s/p  bilat TMA, goals revised as patient is now NWB bilat LE's  1.  Patient will move from supine to sit and sit to supine, scoot up and down, and roll side to side in bed with mod assist  within 7 day(s).   2. Patient will sit EOB x 15 minutes with contact guard assist within 7 days.    3.  Patient will perform t-transfer bed<>chair Mod A within 7 days.    Re-assessed 07/21/22; goals remain appropriate  Re-assessed 07/14/22; goals remain appropriate  Re-Assessment 07/07/22 remain appropriate  1.  Patient will move from supine to sit and sit to supine, scoot up and down, and roll side to side in bed with mod assist  within 7 day(s).   2. Patient will sit EOB x 15 minutes with contact guard assist within 7 days.    3.  Patient will perform sit to stand with maximal assistance x2 within 7 day(s). - discontinued 07/24/22  4.  Patient will transfer from bed to chair and chair to bed via minimal lift equipment (best mover) within 7 day(s). - discontinued 07/24/22    Re-Assessment 06/29/22  1.  Patient will move from supine to sit and sit to supine, scoot up and down, and roll side to side in bed with maximal assist of 1 within 7 day(s).   2. Patient will sit EOB x 15 minutes with contact guard assist within 7 days.    3.  Patient will perform sit to stand with maximal assistance x2 within 7 day(s).  4.  Patient will transfer from bed to chair and chair to bed via minimal lift equipment (best mover) within 7 day(s).    Reassessed 06/20/2022   1.  Patient will move from supine to sit and sit to supine, scoot up and down, and roll side to side in bed with maximal assist of 1 within 7 day(s).   2. Patient will sit EOB x 5 minutes with contact guard assist within 7 days.    3.  Patient will perform sit to stand with maximal assistance x2 within 7 day(s).  4.  Patient will transfer from bed to chair and chair to bed via minimal lift equipment (best mover) within 7 day(s).    Initiated 06/10/2022  1.  Patient will move from supine to sit and  sit to supine, scoot up and down, and roll side to side in bed with contact guard assist within 7 day(s).   2. Patient will sit EOB x 10 minutes with supervision/set-up assist within 7 days.    2.  Patient will perform sit to stand with moderate assistance x2 within 7 day(s).  3.  Patient will transfer from bed to chair and chair to bed with moderate assistance x2 using the least restrictive device within 7 day(s).  4.  Patient will ambulate with moderate assistance x2 for 5 feet with the least restrictive device within 7 day(s).   07/30/2022 1540 by Judithe Modest, PT  Outcome: Progressing     Problem: Safety - Medical Restraint  Goal: Remains free of injury from restraints (Restraint for Interference with Medical Device)  Description: INTERVENTIONS:  1. Determine that other, less restrictive measures have been tried or would not be effective before applying the restraint  2. Evaluate the patient's condition at the  time of restraint application  3. Inform patient/family regarding the reason for restraint  4. Q2H: Monitor safety, psychosocial status, comfort, nutrition and hydration  Outcome: Progressing

## 2022-07-31 NOTE — Other (Signed)
07/31/22 1115   Treatment   Time Off 1115   Treatment Goal 0.5kg in 3hr; met   Observations & Evaluations   Level of Consciousness 0   Oriented X 4   Heart Rhythm Regular   Respiratory Quality/Effort Unlabored   O2 Device None (Room air)   Bilateral Breath Sounds Clear   Skin Condition/Temp Warm;Dry;Other (comment)  (surgical site bilateral feet)   Edema None   Vital Signs   BP (!) 142/70   Temp 97.6 F (36.4 C)   Pulse 83   Respirations 16   Pain Assessment   Pain Assessment None - Denies Pain   Tunneled Hemodialysis Catheter Right Subclavian   Placement Date: 07/01/22   Present on Admission/Arrival: No  Inserted by: Hope Riddell-NP  Insertion Practices: Chlorohexadine skin antisepsis;Maximal barrier precautions;Sterile ultrasound technique;Optimal catheter site selection;Hand hygiene  [place]...   Access Status  Not Accessed   Continued need for line? Yes   Site Assessment Clean, dry & intact   Venous Lumen Status Flushed;Heparin locked;Capped   Arterial Lumen Status Flushed;Heparin locked;Capped   Alcohol Cap Used No   Line Care Ports disinfected;Connections checked and tightened   Dressing Type Bacteriocidal;Transparent   Date of Last Dressing Change 07/31/22   Dressing Status New dressing applied;Clean, dry & intact   Dressing Intervention New;Dressing changed   Dressing Change Due 08/04/22   Treatment Initiation   Dialyze Hours 3   During Hemodialysis Assessment   Ultrafiltration Removed (mL) 1100 ml/min   Post-Hemodialysis Assessment   Post-Treatment Procedures Blood returned;Catheter capped, clamped and heparinized x 2 ports   Barrister's clerk Disinfection;Acid/Vinegar Clean;Heat Disinfect   Rinseback Volume (ml) 300 ml   Blood Volume Processed (Liters) 67.7 L   Dialyzer Clearance Moderately streaked   Duration of Treatment (minutes) 180 minutes   Hemodialysis Intake (ml) 600 ml   Hemodialysis Output (ml) 1100 ml   NET Removed (ml) 500   Tolerated Treatment Fair   Interventions  Taken Ultrafiltration stopped;Ultrafiltration goal decreased  (PRN Albumin given)   Physician Notified Yes   Patient Disposition Return to room     Primary RN SBAR: Dagoberto Reef, RN  Comments: BP drop early in treatment. UF off and goal decreased. PRN Albumin given. UF back on. Intervention was successful. MD aware. No further issues with HD treatment.

## 2022-07-31 NOTE — Care Coordination-Inpatient (Signed)
RUR:  %  Prior Level of Functioning: independent  Disposition: [facility]  If SNF or IPR: Date FOC offered: 06/30/22  Date FOC received:   Accepting facility: pending- will need authorization from [facility]  Date authorization started with reference number:      Date authorization received and expires:   Follow up appointments: new PCP and Specialist in [place] vs [place] )ID, nephrology, podiatry)  DME needed:   Transportation at discharge: BLS/ [facility] stretcher transport  IM/IMM Medicare/Tricare letter given: will need prior to discharge  Is patient a Veteran and connected with [facility]?               If yes, was [employer] transfer form completed and VA notified?   Caregiver Contact: Davonta Stroot - spouse - [phone]  Discharge Caregiver contacted prior to discharge? yes  Care Conference needed? no  Barriers to discharge:  insurance authorization for placement at [facility],       [place] placed call to [facility] to start authorization for SNF at [facility] with start date of 08/01/2022 - CM faxed clinicals to [phone] ref# [id number] - CM also called at 3:45 pm to confirm medicals were received- awaiting outcome of authorization at this time.  Chales Abrahams, MSW

## 2022-07-31 NOTE — Progress Notes (Signed)
Hospitalist Progress Note    NAME:   Jaime [name]   DOB: 02-14-1946   MRN: [id number]     Date/Time: 07/31/2022 8:53 AM  Patient PCP: No primary care provider on file.    Estimated discharge date: 10/2?  Barriers: [facility] once arranged    For reference :   76 y/m with prolonged hospitalization, was admitted to surgical service on 7/31 for perforated hollow viscus underwent emergent laparotomy/ septic shock d/t peritonitis, was intubated and extubated x 2. Also had AKI needing CVVH, now on HD. Also has bilateral foot gangrene. Was transferred to hospitalist service on 8/21.     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 Omnicef for 2 more weeks as per ID recommendations      #Septic shock- resolved   D/t perforated viscus  S/p Laparotomy 7/31, with wound vac drainage   Liver abscess s/p IR drainage, hepatic drain to be removed  Bacteremia     -Discussed with case management regarding discharge disposition - wife needs to set up financial assistance for transfer of patient to [place] . Other option could be to go to an SNF in [place] [facility] ) .    Continue ceftriaxone 2 g IV daily   Antibiotic coverage extended to 8 weeks end date 9/29 given persistence of abscess on repeat CT.  Plan to give PO Omnicef for 2 more weeks as per ID recommendations  Empty drain q shift please  Pt will require repeat imaging of liver before conclusion of therapy  Repeat CT scan show decrease abscess will need IR follow up regarding hepatic drain - IR consulted for drain removal - removed 9/28 by IR   ID follow up appreciated          #Acute hypoxemic respiratory failure:  Intubated 7/31, extubation 8/7  -Reintubated 8/13, extubated on 8/19  -Currently on room air      #Dysphagia:  -resolved      #Acute  kidney injury:  -Now on HD  -Continue with HD  -Nephrology following  -Case management informed the need to set up HD at SNF prior to discharge -[facility] a re[facility] that has in-house dialysis not requiring any transportation            #Diabetes mellitus:  -Sliding scale and lantus  -Monitor FS  -Diabetes team following     #Sacral wound:  -Wound care following    Pending placement        Medical Decision Making:   I personally reviewed labs: CBC and BMP   I personally reviewed imaging:CT abdomen   I personally reviewed EKG:  Toxic drug monitoring:   Discussed case with: Patient , RN, case management on IDR, ID Dr Driscilla Moats yesterday        Code Status: Full   DVT Prophylaxis: Heparin   GI Prophylaxis:    Subjective:     Chief Complaint / Reason for Physician Visit  Follow-up for AKI now progressed to ESRD needing dialysis, diabetes, pancreatitis complicated by perforated viscus needing laparotomy and liver abscess requiring drain- now removed in past 24 hrs by IR  "I am feeling fine".  Discussed with RN events overnight.       Objective:  VITALS:   Last 24hrs VS reviewed since prior progress note. Most recent are:  Patient Vitals for the past 24 hrs:   BP Temp Temp src Pulse Resp SpO2   07/31/22 0745 (!) 124/54 97.5 F (36.4 C) -- 84 -- 96 %   07/31/22 0300 121/60 97.9 F (36.6 C) Oral 99 19 99 %   07/30/22 2343 115/61 98.3 F (36.8 C) Oral 100 17 99 %   07/30/22 2013 122/62 98.8 F (37.1 C) Oral (!) 101 16 98 %   07/30/22 1515 (!) 86/77 98.2 F (36.8 C) Oral (!) 101 18 98 %   07/30/22 1130 (!) 118/52 98 F (36.7 C) Oral 93 18 98 %           Intake/Output Summary (Last 24 hours) at 07/31/2022 0853  Last data filed at 07/30/2022 2343  Gross per 24 hour   Intake --   Output 50 ml   Net -50 ml              I had a face to face encounter and independently examined this patient on 07/31/2022, as outlined below:  PHYSICAL EXAM:  General: Alert, cooperative  EENT:  EOMI. Anicteric sclerae..Rt.  hd catn - jugular   Resp:  CTA bilaterally, no wheezing or rales.  No accessory muscle use  CV:  Regular  rhythm,  No edema  GI:  Soft, Non distended, Non tender.  +Bowel sounds, wound vac in place , drain on rt upper quadrant   Neurologic:  Alert and oriented X 3, normal speech,   Psych:   Good insight. Not anxious nor agitated  Foot :  bilateral feet - gangrene of most of the toes  , not yet full demarcation     Reviewed most current lab test results and cultures  YES  Reviewed most current radiology test results   YES  Review and summation of old records today    NO  Reviewed patient's current orders and MAR    YES  PMH/SH reviewed - no change compared to H&P  ________________________________________________________________________  Care Plan discussed with:    Comments   Patient x    Family      RN x    Care Manager x Remo Lipps   Consultant                       x Multidiciplinary team rounds were held today with case manager, nursing, pharmacist and Occupational psychologist.  Patient's plan of care was discussed; medications were reviewed and discharge planning was addressed.     ________________________________________________________________________  Total NON critical care TIME:  37  Minutes    Total CRITICAL CARE TIME Spent:   Minutes non procedure based      Comments   >50% of visit spent in counseling and coordination of care     ________________________________________________________________________  Luther Bradley, MD     Procedures: see electronic medical records for all procedures/Xrays and details which were not copied into this note but were reviewed prior to creation of Plan.      LABS:  I reviewed today's most current labs and imaging studies.  Pertinent labs include:  Recent Labs     07/29/22  0235 07/31/22  0516   WBC 12.3* 11.3*   HGB 7.7* 7.7*   HCT 25.0* 24.6*   PLT 211 198         Recent Labs     07/29/22  0235 07/31/22  0516  NA 137 135*   K 3.8 3.5   CL 102 98   CO2 26 32   GLUCOSE 71 113*   BUN  26* 26*   CREATININE 3.19* 3.07*   CALCIUM 8.4* 8.7           Signed: Lou Cal, MD

## 2022-07-31 NOTE — Progress Notes (Addendum)
Infectious Disease Progress    Impression      Hepatic abscess  Gas and fluid containing collection 7.1 x 10.8 x 5.4 cm  In anterior left lobe of liver, patchy areas of hypodensity right lobe  4 x 4.5 x 5.5 cm collection  S/p drainage by IR 8/4.Cultures + for  few E.coli  CT abdomen/pelvis 9/27+A percutaneous catheter is again noted with the tip in the anterior left hepatic  lobe.  The tip is in the collection which measures approximately 3.9 x 3.3 cm. This has decreased, previously measuring 5.8 x 2.3 cm.  The borders of the collection are not well delineated.  S/p removal of drain 9/28    Gangrene of toes  S/p bilateral trans metatarsal amputation 9/21.  Both feet are healing    Sepsis  Septic shock.  Resolved    E. coli bacteremia  Blood cultures 7/31+ for E. coli   2/2 LAC (pan sensitive)   Negative repeat cultures 8/4   Treated.    Acute abdomen  Pneumoperitoneum  S/p diagnostic laparoscopy, laparotomy  EGD 7/31  Findings of cloudy fluid in right upper quadrant, yellow/white  Inflammatory peel over lesser curve of stomach, inferior lobe of liver  No gross perforation identified  Pancreas and retroperitoneum appeared edematous  Intra-Op fluid culture + for E. Coli (pansensitive)  Ct abd/ pelvis 9/27+ for  percutaneous catheter is noted with the tip   in the anterior left hepatic lobe.  The tip is in the collection which measures   approximately 3.9 x 3.3 cm. This has decreased, previously measuring   5.8 x 2.3 cm.  The borders of the  collection are not well delineated.      S/p abdominal wound dehiscence  Clean, wound is healing  Cultures 8/17, 8/18-NG.    Leukocytosis  WBC 11.3    Acute hypoxic respiratory failure  Re intubated 8/13, extubated 8/19  Initially intubated 7/31, s/p extubation 8/7  CXR  8/20 + + for atelectasis  Resp cultures  8/13+ for light yeast  Apparent C.albicans/ dubliensis        AKI  Cr 3.07   on  HD    Coagulopathy  Improving    Thrombocytopenia  resolved      Transaminitis  Improving    Hyperbilirubinemia   Improved      Diabetes type 2  Hyperglycemia  A1c 7.7    Diarrhea  resolved    Obesity  BMI 34.76    Plan  Continue ceftriaxone 2 g IV daily   Antibiotic coverage extended to 8 weeks end date 9/29   May transition to po Omnicef  thereafter for 2 weeks end date 10/13  Monitor wound healing of TM amputations  Empty drain q shift please.  D/w patient.      May DC from ID standpoint.   Will sign off, please reconsult as needed.        Extensive review of chart notes, labs, imaging, cultures done  Additionally review of done: Drain output      Patient seen  today.  Awake, denies new complaints.  Laying in bed.  S/p removal of bag.      Past Medical History:   Diagnosis Date    Diabetes mellitus ([place])     Hypertension        Past Surgical History:   Procedure Laterality Date    BLADDER SURGERY N/A 06/01/2022    ENDOSCOPY OF ILEAL CONDUIT performed by Melene Plan, MD at [facility]    CT VISCERAL PERCUTANEOUS DRAIN  06/05/2022    CT VISCERAL PERCUTANEOUS DRAIN 06/05/2022 [facility] CT    FOOT DEBRIDEMENT Bilateral 07/23/2022    BILATERAL TRANSMETATARSAL AMPUTATION performed by Rosana Fret, DPM at [facility] OR    IR TUNNELED CATHETER PLACEMENT GREATER THAN 5 YEARS  07/01/2022    IR TUNNELED CATHETER PLACEMENT GREATER THAN 5 YEARS 07/01/2022 [facility], APRN - NP [facility] ANGIO IR    LAPAROSCOPY N/A 06/01/2022    LAPAROSCOPY DIAGNOSTIC performed by Guinevere Ferrari, MD at [facility]    LAPAROTOMY N/A 06/01/2022    LAPAROTOMY EXPLORATORY performed by Guinevere Ferrari, MD at [facility]       Allergies   Allergen Reactions    Augmentin [Amoxicillin-Pot Clavulanate] Hives     Tolerated ceftriaxone 06/2022    Codeine      Unknown reaction    Oxycodone Itching       Social Connections: Not on file       No family status information on file.           Review of Systems - Negative except those mentioned in H&P      PHYSICAL  EXAM:  General:          Awake, in no distress  EENT:              EOMI. Anicteric sclerae. MMM  Resp:               CTA bilaterally, no wheezing or rales.  No accessory muscle use  CV:                  Regular  rhythm,  No edema  GI:                   Soft, Non distended, Non tender.  +Bowel sounds  Neurologic:      Alert and oriented X 3, normal speech,   Psych:             Good insight. Not anxious nor agitated  Skin:                No rashes.  No jaundice.  Extremities :  No edema, discoloration of  toes++.   Drain+ greenish drainage.    Waymon Amato, MD Jerrel Ivory

## 2022-07-31 NOTE — Progress Notes (Signed)
Name: Jaime [name]   MRN: [id number]  DOB: 10-06-1946  07/31/2022 11:57 AM      Admit Date: 05/31/2022    Admit Diagnosis: Septicemia (HCC) [A41.9]  Gastric perforation (HCC) [K25.5]  Perforated abdominal viscus [R19.8]    Assessment/Plan:     Principal Problem:    Gastric perforation (HCC)  Active Problems:    Poorly controlled type 2 diabetes mellitus (HCC)    Intestinal obstruction (HCC)    Severe sepsis (HCC)    Septic shock (HCC)    Escherichia coli septicemia (HCC)    Liver abscess    Pneumoperitoneum    Acute respiratory failure with hypoxia (HCC)    AKI (acute kidney injury) (HCC)    Multi-organ failure with heart failure (HCC)    Thrombocytopenia (HCC)    E coli bacteremia    Hepatic abscess    Peripheral arterial disease (HCC)    Hyperbilirubinemia    Gram negative sepsis (HCC)    Septicemia (HCC)    Aspiration pneumonia of both lower lobes (HCC)    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 *          07/31/22     Assessment:         AKI  --> now ESKD   Pancreatitis  Hyponatremia  DM  Anemia  Toe ischemia    Discussion/MDM: Patient with multiple medical comorbidities, each with high likelihood for morbidity and mortality if left untreated.  Patient being treated with medication that requires intensive monitoring due to increased risk for toxicity. I have reviewed patient's presenting subjective and objective findings, as well as all laboratory studies, imaging studies, and vital signs to date as well as treatment rendered and patient's response to those treatments.  In addition, prior medical, surgical and relevant social and family histories were reviewed.        Discussion:     KRT initiated 7/31; now on  MWF schedule; perm cath 8/30.   Needs outpatient HD placement under ESKD diagnosis - CM working on options (here or back in [place])  Filled out 2728 form  H/H not at goal.  Continue ESA.        Plan:     About 1.1 total fluid removal on dialysis today.  3K bath used.  Tunneled hemodialysis catheter was working well with the blood flow 400 mL/min.      Bryan Lemma, MD    Dr Dorthey Sawyer  Cell no- [phone]  Available on perfect serve.      Signed By: Bryan Lemma, MD     July 31, 2022         Subjective:   Hospital day 46   Seen and examined while on dialysis today.  He was drowsy and had no complaints  Review of Systems:  Pertinent items are noted in the History of Present Illness.   Objective:    BP (!) 142/70   Pulse 83   Temp 97.6 F (36.4 C)   Resp 16   Ht 5\' 8"  (1.727 m)   Wt 202 lb 9.6 oz (91.9 kg)   SpO2 96%   BMI 30.81 kg/m     Physical Exam:  Physical Exam  Vitals  and nursing note reviewed.   Constitutional:       Appearance: Normal appearance.   HENT:      Head: Normocephalic and atraumatic.      Nose: Nose normal.      Mouth/Throat:      Mouth: Mucous membranes are moist.   Cardiovascular:      Rate and Rhythm: Tachycardia present.      Pulses: Normal pulses.      Heart sounds: Normal heart sounds.   Pulmonary:      Effort: Pulmonary effort is normal.   Abdominal:      General: Abdomen is flat.   Musculoskeletal:      Cervical back: Neck supple.      Right lower leg: Edema present.      Left lower leg: Edema present.   Skin:     Coloration: Skin is pale.      Findings: Lesion and rash present.   Neurological:      General: No focal deficit present.      Mental Status: He is oriented to person, place, and time. Mental status is at baseline.         Recent Results (from the past 24 hour(s))   POCT Glucose    Collection Time: 07/30/22  4:29 PM   Result Value Ref Range    POC Glucose 110 65 - 117 mg/dL    Performed by: Burnett Kanaris PCT    CBC    Collection Time: 07/31/22  5:16 AM    Result Value Ref Range    WBC 11.3 (H) 4.1 - 11.1 K/uL    RBC 2.44 (L) 4.10 - 5.70 M/uL    Hemoglobin 7.7 (L) 12.1 - 17.0 g/dL    Hematocrit 24.6 (L) 36.6 - 50.3 %    MCV 100.8 (H) 80.0 - 99.0 FL    MCH 31.6 26.0 - 34.0 PG    MCHC 31.3 30.0 - 36.5 g/dL    RDW 15.3 (H) 11.5 - 14.5 %    Platelets 198 150 - 400 K/uL    MPV 10.4 8.9 - 12.9 FL    Nucleated RBCs 0.0 0 PER 100 WBC    nRBC 0.00 0.00 - 0.01 K/uL   Basic Metabolic Panel    Collection Time: 07/31/22  5:16 AM   Result Value Ref Range    Sodium 135 (L) 136 - 145 mmol/L    Potassium 3.5 3.5 - 5.1 mmol/L    Chloride 98 97 - 108 mmol/L    CO2 32 21 - 32 mmol/L    Anion Gap 5 5 - 15 mmol/L    Glucose 113 (H) 65 - 100 mg/dL    BUN 26 (H) 6 - 20 MG/DL    Creatinine 3.07 (H) 0.70 - 1.30 MG/DL    Bun/Cre Ratio 8 (L) 12 - 20      Est, Glom Filt Rate 20 (L) >60 ml/min/1.83m2    Calcium 8.7 8.5 - 10.1 MG/DL   POCT Glucose    Collection Time: 07/31/22  6:20 AM   Result Value Ref Range    POC Glucose 117 65 - 117 mg/dL    Performed by: Tyrone Apple Bridgette Habermann RN           Intake/Output Summary (Last 24 hours) at 07/31/2022 1157  Last data filed at 07/31/2022 1115  Gross per 24 hour   Intake 600 ml   Output 1150 ml   Net -550 ml  Data Review:   Recent Labs     07/31/22  0516   NA 135*   K 3.5   BUN 26*   CREATININE 3.07*   WBC 11.3*   HGB 7.7*   HCT 24.6*   PLT 198         No current facility-administered medications on file prior to encounter.     Current Outpatient Medications on File Prior to Encounter   Medication Sig Dispense Refill    metFORMIN (GLUCOPHAGE) 500 MG tablet Take 1 tablet by mouth daily (Patient not taking: Reported on 06/08/2022)      metoprolol (LOPRESSOR) 100 MG tablet Take 1 tablet by mouth daily      hydrALAZINE (APRESOLINE) 25 MG tablet Take 1 tablet by mouth 3 times daily (Patient not taking: Reported on 06/08/2022)      atorvastatin (LIPITOR) 40 MG tablet Take 1 tablet by mouth daily (Patient not taking: Reported on 06/08/2022)      losartan (COZAAR)  100 MG tablet Take 1 tablet by mouth daily (Patient not taking: Reported on 06/08/2022)      metFORMIN (GLUCOPHAGE-XR) 500 MG extended release tablet Take 1 tablet by mouth daily (with breakfast) (Patient not taking: Reported on 06/08/2022)      hydroCHLOROthiazide (HYDRODIURIL) 25 MG tablet TAKE 1 TABLET BY MOUTH ONCE DAILY AS NEEDED (Patient not taking: Reported on 06/08/2022)      metoprolol succinate (TOPROL XL) 100 MG extended release tablet TAKE 1 TABLET BY MOUTH ONCE DAILY WITH OR IMMEDIATELY FOLLOWING A MEAL          Full Code  Care Plan discussed with:  Patient x    Family      RN     Dialysis RN     Care Manager x       Hospitalist    Consultant:            Comments   >50% of visit spent in counseling and coordination of care y          Signed By: Bryan Lemma, MD     July 31, 2022       This dictation was done by dragon, computer voice recognition software.  Often unanticipated grammatical, syntax, phones and other interpretive errors are inadvertently transcribed.  Please excuse errors that have escaped final proofreading. Please contact me if you suspect dictation or transcription errors.      Dr Dorthey Sawyer    Office No- [phone]  [facility]  [address]  [address]  [place], [place] [address]    Fax: [phone]

## 2022-08-01 LAB — POCT GLUCOSE
POC Glucose: 170 mg/dL — ABNORMAL HIGH (ref 65–117)
POC Glucose: 121 mg/dL — ABNORMAL HIGH (ref 65–117)
POC Glucose: 170 mg/dL — ABNORMAL HIGH (ref 65–117)
POC Glucose: 156 mg/dL — ABNORMAL HIGH (ref 65–117)

## 2022-08-01 MED FILL — HEPARIN SODIUM (PORCINE) 5000 UNIT/ML IJ SOLN: 5000 UNIT/ML | INTRAMUSCULAR | Qty: 1

## 2022-08-01 MED FILL — RISAQUAD-2 PO CAPS: ORAL | Qty: 1

## 2022-08-01 MED FILL — RETACRIT 3000 UNIT/ML IJ SOLN: 3000 UNIT/ML | INTRAMUSCULAR | Qty: 2

## 2022-08-01 MED FILL — SEVELAMER CARBONATE 2.4 G PO PACK: 2.4 g | ORAL | Qty: 2.4

## 2022-08-01 MED FILL — CEFDINIR 300 MG PO CAPS: 300 MG | ORAL | Qty: 1

## 2022-08-01 MED FILL — AMIODARONE HCL 200 MG PO TABS: 200 MG | ORAL | Qty: 1

## 2022-08-01 MED FILL — THERA M PLUS PO TABS: ORAL | Qty: 1

## 2022-08-01 NOTE — Progress Notes (Signed)
Hospitalist Progress Note    NAME:   Jaime [name]   DOB: 1946-03-23   MRN: [id number]     Date/Time: 08/01/2022 10:11 AM  Patient PCP: No primary care provider on file.    Estimated discharge date: 10/3  Barriers: [facility] accept Monday after dialysis    For reference :   76 y/m with prolonged hospitalization, was admitted to surgical service on 7/31 for perforated hollow viscus underwent emergent laparotomy/ septic shock d/t peritonitis, was intubated and extubated x 2. Also had AKI needing CVVH, now on HD. Also has bilateral foot gangrene. Was transferred to hospitalist service on 8/21.     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 Omnicef for 2 more weeks as per ID recommendations      #Septic shock- resolved   D/t perforated viscus  S/p Laparotomy 7/31, with wound vac drainage   Liver abscess s/p IR drainage, hepatic drain to be removed  Bacteremia     -Discussed with case management regarding discharge disposition - wife needs to set up financial assistance for transfer of patient to [place] . Other option could be to go to an SNF in [place] [facility] ) .    Continue ceftriaxone 2 g IV daily   Antibiotic coverage extended to 8 weeks end date 9/29 given persistence of abscess on repeat CT.  Plan to give PO Omnicef for 2 more weeks as per ID recommendations  Pt will require repeat imaging of liver before conclusion of therapy  Repeat CT scan show decrease abscess will need IR follow up regarding hepatic drain - IR consulted for drain removal - removed 9/28 by IR   ID follow up appreciated- signed off now          #Acute hypoxemic respiratory failure:  Intubated 7/31, extubation 8/7  -Reintubated 8/13, extubated on 8/19  -Currently on room air      #Dysphagia:  -resolved       #Acute kidney injury:  -Now on HD MWF  -Continue with HD  -Nephrology following  -Case management informed the need to set up HD at SNF prior to discharge -[facility] auth [facility]-- will be able to accept after HD on Monday per CM            #Diabetes mellitus:  -Sliding scale and lantus  -Monitor FS  -Diabetes team following     #Sacral wound:  -Wound care following    Pending placement        Medical Decision Making:   I personally reviewed labs: none today  I personally reviewed imaging: none today  I personally reviewed EKG:  Toxic drug monitoring:   Discussed case with: Patient , RN, CM weekend        Code Status: Full   DVT Prophylaxis: Heparin   GI Prophylaxis:    Subjective:     Chief Complaint / Reason for Physician Visit  Follow-up for AKI now progressed to ESRD needing dialysis, diabetes, pancreatitis complicated by perforated viscus needing laparotomy and liver abscess requiring drain- now removed by IR  "I am feeling fine".  Discussed with RN events overnight.   Auth obtained by [facility]- DC on Monday after dialysis    Objective:  VITALS:   Last 24hrs VS reviewed since prior progress note. Most recent are:  Patient Vitals for the past 24 hrs:   BP Temp Temp src Pulse Resp SpO2   08/01/22 0748 129/63 97.9 F (36.6 C) Oral 88 17 96 %   08/01/22 0323 108/62 97.5 F (36.4 C) Oral 88 16 98 %   07/31/22 2259 116/64 98 F (36.7 C) Oral 93 16 95 %   07/31/22 2010 116/69 97.8 F (36.6 C) Oral 95 19 99 %   07/31/22 1614 (!) 125/57 97.9 F (36.6 C) -- 88 24 100 %   07/31/22 1115 (!) 142/70 97.6 F (36.4 C) -- 83 16 --   07/31/22 1100 127/62 -- -- 80 -- --   07/31/22 1045 122/64 -- -- 81 -- --   07/31/22 1030 125/61 -- -- 78 -- --   07/31/22 1015 120/62 -- -- 79 -- --           Intake/Output Summary (Last 24 hours) at 08/01/2022 1011  Last data filed at 08/01/2022 0615  Gross per 24 hour   Intake 630 ml   Output 1350 ml   Net -720 ml              I had a face to face encounter and  independently examined this patient on 08/01/2022, as outlined below:  PHYSICAL EXAM:  General: Alert, cooperative  EENT:  EOMI. Anicteric sclerae..Rt. hd catn - jugular   Resp:  CTA bilaterally, no wheezing or rales.  No accessory muscle use  CV:  Regular  rhythm,  No edema  GI:  Soft, Non distended, Non tender.  +Bowel sounds, wound vac in place , drain on rt upper quadrant   Neurologic:  Alert and oriented X 3, normal speech,   Psych:   Good insight. Not anxious nor agitated  Foot :  bilateral feet - gangrene of most of the toes  , not yet full demarcation     Reviewed most current lab test results and cultures  YES  Reviewed most current radiology test results   YES  Review and summation of old records today    NO  Reviewed patient's current orders and MAR    YES  PMH/SH reviewed - no change compared to H&P  ________________________________________________________________________  Care Plan discussed with:    Comments   Patient x    Family      RN x    Care Manager x Weekend team   Consultant                        Multidiciplinary team rounds were held today with case manager, nursing, pharmacist and Occupational psychologist.  Patient's plan of care was discussed; medications were reviewed and discharge planning was addressed.     ________________________________________________________________________  Total NON critical care TIME:  26  Minutes    Total CRITICAL CARE TIME Spent:   Minutes non procedure based      Comments   >50% of visit spent in counseling and coordination of care     ________________________________________________________________________  Luther Bradley, MD     Procedures: see electronic medical records for all procedures/Xrays and details which were not copied into this note but were reviewed prior to creation of Plan.      LABS:  I reviewed today's most current labs and imaging studies.  Pertinent labs include:  Recent Labs     07/31/22  0516   WBC 11.3*  HGB 7.7*   HCT 24.6*   PLT 198          Recent Labs     07/31/22  0516   NA 135*   K 3.5   CL 98   CO2 32   GLUCOSE 113*   BUN 26*   CREATININE 3.07*   CALCIUM 8.7           Signed: Lou Cal, MD

## 2022-08-01 NOTE — Care Coordination-Inpatient (Signed)
Transition of Care Note  RUR:  18% moderate  Prior Level of Functioning: independent  Disposition: [facility]  If SNF or IPR: Date FOC offered: 06/30/22  Date FOC received: 06/30/2022  [facility]ing [facility]  Date authorization started with reference number: 07/31/22, [id number]  Date authorization received and expires: Auth #[id number] received 07/31/2022, expires 08/04/22  Follow up appointments: new PCP and Specialist in [place] vs [place] )ID, nephrology, podiatry)  DME needed:   Transportation at discharge: BLS/ [facility] stretcher transport  IM/IMM Medicare/Tricare letter given: will need prior to discharge  Is patient a Veteran and connected with VA? no              If yes, was [employer] transfer form completed and VA notified?   Caregiver Contact: Carry Ortez - spouse - [phone]  Discharge Caregiver contacted prior to discharge? yes  Care Conference needed? no  Barriers to discharge:  Bed at [facility] received authorization from [place]. Auth #[id number], Plan auth ID #[id number], auth expires 08/04/22.    Pt can go to [facility] on Monday. They are not able to take the pt prior to that because of his status as a new HD pt.     CM following.    Anette Guarneri, LCSW, weekend CM  [phone]

## 2022-08-02 LAB — POCT GLUCOSE
POC Glucose: 161 mg/dL — ABNORMAL HIGH (ref 65–117)
POC Glucose: 126 mg/dL — ABNORMAL HIGH (ref 65–117)
POC Glucose: 122 mg/dL — ABNORMAL HIGH (ref 65–117)
POC Glucose: 134 mg/dL — ABNORMAL HIGH (ref 65–117)

## 2022-08-02 MED FILL — AMIODARONE HCL 200 MG PO TABS: 200 MG | ORAL | Qty: 1

## 2022-08-02 MED FILL — SEVELAMER CARBONATE 2.4 G PO PACK: 2.4 g | ORAL | Qty: 2.4

## 2022-08-02 MED FILL — MIRTAZAPINE 15 MG PO TABS: 15 MG | ORAL | Qty: 1

## 2022-08-02 MED FILL — RISAQUAD-2 PO CAPS: ORAL | Qty: 1

## 2022-08-02 MED FILL — THERA M PLUS PO TABS: ORAL | Qty: 1

## 2022-08-02 MED FILL — HEPARIN SODIUM (PORCINE) 5000 UNIT/ML IJ SOLN: 5000 UNIT/ML | INTRAMUSCULAR | Qty: 1

## 2022-08-02 MED FILL — CEFDINIR 300 MG PO CAPS: 300 MG | ORAL | Qty: 1

## 2022-08-02 NOTE — Plan of Care (Signed)
Problem: Safety - Adult  Goal: Free from fall injury  08/02/2022 2310 by Wyline Mood, RN  Outcome: Progressing  08/02/2022 1129 by Francesco Sor, RN  Outcome: Progressing     Problem: Respiratory - Adult  Goal: Achieves optimal ventilation and oxygenation  08/02/2022 2310 by Wyline Mood, RN  Outcome: Progressing  08/02/2022 1129 by Francesco Sor, RN  Outcome: Progressing     Problem: Pain  Goal: Verbalizes/displays adequate comfort level or baseline comfort level  08/02/2022 2310 by Wyline Mood, RN  Outcome: Progressing  08/02/2022 1129 by Francesco Sor, RN  Outcome: Progressing

## 2022-08-02 NOTE — Progress Notes (Signed)
I have reviewed and am in agreement with the assessment and documentation as performed by my orientee, Marianne, RN. Please refer to Marianne's progress note for update on pt's daily events.

## 2022-08-02 NOTE — Progress Notes (Signed)
Hospitalist Progress Note    NAME:   Jaime [name]   DOB: 24-Mar-1946   MRN: [id number]     Date/Time: 08/02/2022 9:32 AM  Patient PCP: No primary care provider on file.    Estimated discharge date: 10/2  Barriers: [facility] after dialysis    For reference :   76 y/m with prolonged hospitalization, was admitted to surgical service on 7/31 for perforated hollow viscus underwent emergent laparotomy/ septic shock d/t peritonitis, was intubated and extubated x 2. Also had AKI needing CVVH, now on HD. Also has bilateral foot gangrene. Was transferred to hospitalist service on 8/21.     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 Omnicef for 2 more weeks as per ID recommendations      #Septic shock- resolved   D/t perforated viscus  S/p Laparotomy 7/31, with wound vac drainage   Liver abscess s/p IR drainage, hepatic drain to be removed  Bacteremia     -Discussed with case management regarding discharge disposition - wife needs to set up financial assistance for transfer of patient to [place] . Other option could be to go to an SNF in [place] [facility] ) .    Continue ceftriaxone 2 g IV daily   Antibiotic coverage extended to 8 weeks end date 9/29 given persistence of abscess on repeat CT.  Plan to give PO Omnicef for 2 more weeks as per ID recommendations  Pt will require repeat imaging of liver before conclusion of therapy  Repeat CT scan show decrease abscess will need IR follow up regarding hepatic drain - IR consulted for drain removal - removed 9/28 by IR   ID follow up appreciated- signed off now          #Acute hypoxemic respiratory failure:  Intubated 7/31, extubation 8/7  -Reintubated 8/13, extubated on 8/19  -Currently on room air      #Dysphagia:  -resolved       #Acute kidney injury:  -Now on HD MWF  -Continue with HD  -Nephrology following  -Case management informed the need to set up HD at SNF prior to discharge -[facility]-- will be able to accept after HD on Monday per CM            #Diabetes mellitus:  -Sliding scale and lantus  -Monitor FS  -Diabetes team following     #Sacral wound:  -Wound care following    Pending placement        Medical Decision Making:   I personally reviewed labs: none today  I personally reviewed imaging: none today  I personally reviewed EKG:  Toxic drug monitoring:   Discussed case with: Patient , RN, CM weekend        Code Status: Full   DVT Prophylaxis: Heparin   GI Prophylaxis:    Subjective:     Chief Complaint / Reason for Physician Visit  Follow-up for AKI now progressed to ESRD needing dialysis, diabetes, pancreatitis complicated by perforated viscus needing laparotomy and liver abscess requiring drain- now removed by IR  "I am feeling fine".  Discussed with RN events overnight.   Auth obtained by [facility]- DC on Monday after dialysis  No new complains  today    Objective:     VITALS:   Last 24hrs VS reviewed since prior progress note. Most recent are:  Patient Vitals for the past 24 hrs:   BP Temp Temp src Pulse Resp SpO2   08/02/22 0918 122/70 -- -- 80 -- --   08/02/22 0746 116/70 97.8 F (36.6 C) Oral 77 12 99 %   08/02/22 0457 138/65 97.7 F (36.5 C) Oral 80 11 100 %   08/01/22 2328 124/63 98.7 F (37.1 C) Oral 85 20 99 %   08/01/22 2016 112/60 98.1 F (36.7 C) Oral 95 16 (!) 72 %   08/01/22 1506 132/74 98.1 F (36.7 C) Oral 88 15 98 %   08/01/22 1107 (!) 131/58 97.7 F (36.5 C) Oral 91 17 100 %         No intake or output data in the 24 hours ending 08/02/22 0932           I had a face to face encounter and independently examined this patient on 08/02/2022, as outlined below:  PHYSICAL EXAM:  General: Alert, cooperative  EENT:  EOMI. Anicteric sclerae..Rt. hd catn - jugular   Resp:  CTA  bilaterally, no wheezing or rales.  No accessory muscle use  CV:  Regular  rhythm,  No edema  GI:  Soft, Non distended, Non tender.  +Bowel sounds, wound vac in place , drain on rt upper quadrant   Neurologic:  Alert and oriented X 3, normal speech,   Psych:   Good insight. Not anxious nor agitated  Foot :  bilateral feet - gangrene of most of the toes  , not yet full demarcation     Reviewed most current lab test results and cultures  YES  Reviewed most current radiology test results   YES  Review and summation of old records today    NO  Reviewed patient's current orders and MAR    YES  PMH/SH reviewed - no change compared to H&P  ________________________________________________________________________  Care Plan discussed with:    Comments   Patient x    Family      RN x    Care Manager x Weekend team   Consultant                        Multidiciplinary team rounds were held today with case manager, nursing, pharmacist and Higher education careers adviser.  Patient's plan of care was discussed; medications were reviewed and discharge planning was addressed.     ________________________________________________________________________  Total NON critical care TIME:  24  Minutes    Total CRITICAL CARE TIME Spent:   Minutes non procedure based      Comments   >50% of visit spent in counseling and coordination of care     ________________________________________________________________________  Lou Cal, MD     Procedures: see electronic medical records for all procedures/Xrays and details which were not copied into this note but were reviewed prior to creation of Plan.      LABS:  I reviewed today's most current labs and imaging studies.  Pertinent labs include:  Recent Labs     07/31/22  0516   WBC 11.3*   HGB 7.7*   HCT 24.6*   PLT 198         Recent Labs     07/31/22  0516   NA 135*   K 3.5   CL 98   CO2 32   GLUCOSE 113*  BUN 26*   CREATININE 3.07*   CALCIUM 8.7           Signed: Luther Bradley, MD

## 2022-08-02 NOTE — Plan of Care (Signed)
Problem: Safety - Adult  Goal: Free from fall injury  Outcome: Progressing     Problem: Respiratory - Adult  Goal: Achieves optimal ventilation and oxygenation  Outcome: Progressing     Problem: Pain  Goal: Verbalizes/displays adequate comfort level or baseline comfort level  Outcome: Progressing     Problem: Discharge Planning  Goal: Discharge to home or other facility with appropriate resources  Outcome: Progressing     Problem: Chronic Conditions and Co-morbidities  Goal: Patient's chronic conditions and co-morbidity symptoms are monitored and maintained or improved  Outcome: Progressing     Problem: Neurosensory - Adult  Goal: Achieves maximal functionality and self care  Outcome: Progressing     Problem: Cardiovascular - Adult  Goal: Maintains optimal cardiac output and hemodynamic stability  Outcome: Progressing  Goal: Absence of cardiac dysrhythmias or at baseline  Outcome: Progressing     Problem: Gastrointestinal - Adult  Goal: Maintains or returns to baseline bowel function  Outcome: Progressing  Goal: Maintains adequate nutritional intake  Outcome: Progressing     Problem: Genitourinary - Adult  Goal: Absence of urinary retention  Outcome: Progressing     Problem: Metabolic/Fluid and Electrolytes - Adult  Goal: Electrolytes maintained within normal limits  Outcome: Progressing  Goal: Hemodynamic stability and optimal renal function maintained  Outcome: Progressing  Goal: Glucose maintained within prescribed range  Outcome: Progressing     Problem: Skin/Tissue Integrity - Adult  Goal: Skin integrity remains intact  Outcome: Progressing  Goal: Incisions, wounds, or drain sites healing without S/S of infection  Outcome: Progressing  Goal: Oral mucous membranes remain intact  Outcome: Progressing     Problem: Hematologic - Adult  Goal: Maintains hematologic stability  Outcome: Progressing     Problem: Musculoskeletal - Adult  Goal: Return mobility to safest level of function  Outcome: Progressing      Problem: Skin/Tissue Integrity  Goal: Absence of new skin breakdown  Description: 1.  Monitor for areas of redness and/or skin breakdown  2.  Assess vascular access sites hourly  3.  Every 4-6 hours minimum:  Change oxygen saturation probe site  4.  Every 4-6 hours:  If on nasal continuous positive airway pressure, respiratory therapy assess nares and determine need for appliance change or resting period.  Outcome: Progressing     Problem: ABCDS Injury Assessment  Goal: Absence of physical injury  Outcome: Progressing     Problem: Safety - Medical Restraint  Goal: Remains free of injury from restraints (Restraint for Interference with Medical Device)  Description: INTERVENTIONS:  1. Determine that other, less restrictive measures have been tried or would not be effective before applying the restraint  2. Evaluate the patient's condition at the time of restraint application  3. Inform patient/family regarding the reason for restraint  4. Q2H: Monitor safety, psychosocial status, comfort, nutrition and hydration  Outcome: Progressing

## 2022-08-02 NOTE — Progress Notes (Addendum)
0715: Bedside and Verbal shift change report given to [employer] (oncoming nurse) by Su Grand (offgoing nurse). Report included the following information Nurse Handoff Report, Adult Overview, Intake/Output, MAR, Recent Results, and Cardiac Rhythm NSR .     Braden Score 11. All of the following interventions have been implemented to prevent pressure injury:    SKIN ASSESSMENT (S)  Dual skin assessment completed at shift change: Yes  Name of second RN who completed Dual Skin Assessment: Frann Rider RN  Picture of wound uploaded to EMR: Yes  Venelex ordered and given per protocol: Yes  Wound care consulted if wounds present: Yes    SURFACE (S)  Stryker air pump or specialty bed ordered: Yes  Type of bed: Stryker  Only white chux used with specialty surfaces (no green chux used): Yes  Waffle cushion used for chair positioning: NA    KEEP MOVING (K)  Mobility status (Bedrest, Chairbound, UWA x 1 assist , 2 assist, Max assist): max  Q2 hour turns documented: Yes  Refusals to turn and education provided documented: Yes  Device used to float heels: heel float  PT/OT consulted: Yes    INCONTINENCE (I)  Incontinence status assessed Q2 hours: Yes  External catheter in use: malewick  Barrier cream in use: yes    NUTRITION (N)  I/O's documented every 8 hours: Yes  Oral supplements ordered if appropriate: Yes  Nutrition services consulted: Yes    All concerns about new DTI's must be escalated directly to attending MD, charge nurse, [place] and nurse director.     1930: End of Shift Note    Bedside shift change report given to [place] (oncoming nurse) by Francesco Sor, RN (offgoing nurse).  Report included the following information SBAR, Intake/Output, MAR, Recent Results, and Cardiac Rhythm NSR.    Shift worked:  7a-7p     Shift summary and any significant changes:     N/A     Concerns for physician to address:  N/A   Zone phone for oncoming shift:          Activity:     Number times ambulated in hallways past shift: 0  Number  of times OOB to chair past shift: 0    Cardiac:   Cardiac Monitoring: Yes           Access:  Current line(s): PIV and HD access     Genitourinary:   Urinary status: voiding, oliguric, and external catheter    Respiratory:      Chronic home O2 use?: NO  Incentive spirometer at bedside: NO       GI:     Current diet:  ADULT ORAL NUTRITION SUPPLEMENT; Breakfast, Dinner; Low Calorie/High Protein Oral Supplement  ADULT DIET; Regular; 5 carb choices (75 gm/meal); NO iced tea. Vanilla Ensure high protein preferred  Passing flatus: YES  Tolerating current diet: YES       Pain Management:   Patient states pain is manageable on current regimen: YES    Skin:     Interventions: specialty bed, float heels, foam dressing, PT/OT consult, limit briefs, internal/external urinary devices, and nutritional support    Patient Safety:  Fall Score:    Interventions: bed/chair alarm       Length of Stay:  Expected LOS: 65  Actual LOS: [place], RN

## 2022-08-03 LAB — POCT GLUCOSE
POC Glucose: 167 mg/dL — ABNORMAL HIGH (ref 65–117)
POC Glucose: 139 mg/dL — ABNORMAL HIGH (ref 65–117)

## 2022-08-03 MED ORDER — MIDODRINE HCL 5 MG PO TABS
5 MG | ORAL_TABLET | Freq: Every day | ORAL | 3 refills | Status: AC | PRN
Start: 2022-08-03 — End: 2022-09-30

## 2022-08-03 MED ORDER — HEPARIN SODIUM (PORCINE) 1000 UNIT/ML IJ SOLN
1000 UNIT/ML | INTRAMUSCULAR | Status: DC
Start: 2022-08-03 — End: 2022-08-03

## 2022-08-03 MED ORDER — AMIODARONE HCL 200 MG PO TABS
200 MG | ORAL_TABLET | Freq: Every day | ORAL | 0 refills | Status: DC
Start: 2022-08-03 — End: 2022-09-30

## 2022-08-03 MED ORDER — OXYCODONE HCL 5 MG PO TABS
5 MG | ORAL_TABLET | ORAL | 0 refills | Status: AC | PRN
Start: 2022-08-03 — End: 2022-08-06

## 2022-08-03 MED ORDER — SEVELAMER CARBONATE 2.4 G PO PACK
2.4 g | Freq: Three times a day (TID) | ORAL | 0 refills | Status: DC
Start: 2022-08-03 — End: 2022-10-05

## 2022-08-03 MED ORDER — CEFDINIR 300 MG PO CAPS
300 MG | ORAL_CAPSULE | Freq: Every day | ORAL | 0 refills | Status: AC
Start: 2022-08-03 — End: 2022-08-16

## 2022-08-03 MED ORDER — MIRTAZAPINE 7.5 MG PO TABS
7.5 MG | ORAL_TABLET | Freq: Every evening | ORAL | 0 refills | Status: DC
Start: 2022-08-03 — End: 2022-10-05

## 2022-08-03 MED FILL — HEPARIN SODIUM (PORCINE) 5000 UNIT/ML IJ SOLN: 5000 UNIT/ML | INTRAMUSCULAR | Qty: 1

## 2022-08-03 MED FILL — RETACRIT 3000 UNIT/ML IJ SOLN: 3000 UNIT/ML | INTRAMUSCULAR | Qty: 2

## 2022-08-03 MED FILL — RENVELA 2.4 G PO PACK: 2.4 g | ORAL | Qty: 2.4

## 2022-08-03 MED FILL — MIRTAZAPINE 15 MG PO TABS: 15 MG | ORAL | Qty: 1

## 2022-08-03 MED FILL — HEPARIN SODIUM (PORCINE) 1000 UNIT/ML IJ SOLN: 1000 UNIT/ML | INTRAMUSCULAR | Qty: 10

## 2022-08-03 MED FILL — MIDODRINE HCL 5 MG PO TABS: 5 MG | ORAL | Qty: 1

## 2022-08-03 NOTE — Other (Signed)
08/03/22 1210   Treatment   Time Off 1210   Treatment Goal 48m in 3hr; met   Observations & Evaluations   Level of Consciousness 0   Oriented X 4   Heart Rhythm Regular   Respiratory Quality/Effort Unlabored   O2 Device None (Room air)   Bilateral Breath Sounds Clear   Skin Condition/Temp Warm;Dry   Edema None   Vital Signs   BP 120/67   Temp 97.6 F (36.4 C)   Pulse 82   Respirations 16   Pain Assessment   Pain Assessment None - Denies Pain   Tunneled Hemodialysis Catheter Right Subclavian   Placement Date: 07/01/22   Present on Admission/Arrival: No  Inserted by: Hope Riddell-NP  Insertion Practices: Chlorohexadine skin antisepsis;Maximal barrier precautions;Sterile ultrasound technique;Optimal catheter site selection;Hand hygiene  Orien...   Access Status  Not Accessed   Continued need for line? Yes   Site Assessment Clean, dry & intact   Venous Lumen Status Flushed;Heparin locked;Capped   Arterial Lumen Status Flushed;Heparin locked;Capped   Alcohol Cap Used No   Line Care Connections checked and tightened;Ports disinfected   Dressing Type Bacteriocidal;Transparent   Date of Last Dressing Change 07/31/22   Dressing Status Clean, dry & intact   Dressing Change Due 08/04/22   Treatment Initiation   Dialyze Hours 3   During Hemodialysis Assessment   Ultrafiltration Removed (mL) 700 ml/min   Post-Hemodialysis Assessment   Post-Treatment Procedures Blood returned;Catheter capped, clamped and heparinized x 2 ports   MBarrister's clerkDisinfection;Acid/Vinegar Clean;Heat Disinfect   Rinseback Volume (ml) 300 ml   Blood Volume Processed (Liters) 66.2 L   Dialyzer Clearance Moderately streaked   Duration of Treatment (minutes) 180 minutes   Hemodialysis Intake (ml) 700 ml   Hemodialysis Output (ml) 700 ml   NET Removed (ml) 0   Tolerated Treatment Fair   Interventions Taken Fluid bolus;Ultrafiltration stopped;Ultrafiltration goal decreased   Patient Disposition Return to room     Primary  RN SBAR: EFenton Foy RN  Comments: no issues with Hd after patient's usual initial BP drop that ws relieved with NS bolus.

## 2022-08-03 NOTE — Progress Notes (Signed)
Name: Jaime [name]   MRN: [id number]  DOB: 1946/11/01  08/03/2022 10:32 AM      Admit Date: 05/31/2022    Admit Diagnosis: Septicemia ([place]) [A41.9]  Gastric perforation ([place]) [K25.5]  Perforated abdominal viscus [R19.8]    Assessment/Plan:     Principal Problem:    Gastric perforation ([place])  Active Problems:    Type 2 diabetes mellitus with hyperglycemia, without long-term current use of insulin (HCC)    Intestinal obstruction (HCC)    Severe sepsis (HCC)    Septic shock (HCC)    E coli infection    Liver abscess    Pneumoperitoneum    Acute respiratory failure with hypoxia (HCC)    AKI (acute kidney injury) ([place])    Multi-organ failure with heart failure (HCC)    Thrombocytopenia (HCC)    E coli bacteremia    Hepatic abscess    Peripheral arterial disease (HCC)    Hyperbilirubinemia    Gram negative sepsis (HCC)    Septicemia (HCC)    Aspiration pneumonia of both lower lobes (HCC)    Gangrene of toe of both feet (HCC)    Bandemia    Acute pancreatitis    Wound dehiscence    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   Needs outpatient HD placement under ESKD diagnosis - CM working on options (here or back in [place])  Filled out 2728 form  H/H not at goal.  Continue ESA.        Plan:     Seen on HD at 0915.  BP stable.      Hulda Humphrey, MD        Signed By: Hulda Humphrey, MD     August 03, 2022         Subjective:   Hospital day 5   Seen and examined while on dialysis today.  He was drowsy and had no complaints  Review of Systems:  Pertinent items are noted in the History of Present Illness.   Objective:     BP 132/84   Pulse 90   Temp 97.3 F (36.3 C) (Axillary)   Resp 25   Ht 1.727 m (5\' 8" )   Wt 91.9 kg (202 lb 9.6 oz)   SpO2 98%   BMI 30.81 kg/m     Physical Exam:  Physical Exam  Vitals and nursing note reviewed.   Constitutional:       Appearance: Normal appearance.   HENT:      Head: Normocephalic and atraumatic.      Nose: Nose normal.      Mouth/Throat:      Mouth: Mucous membranes are moist.   Cardiovascular:      Rate and Rhythm: Tachycardia present.      Pulses: Normal pulses.      Heart sounds: Normal heart sounds.   Pulmonary:      Effort: Pulmonary effort is normal.   Abdominal:      General: Abdomen is flat.  Musculoskeletal:      Cervical back: Neck supple.      Right lower leg: Edema present.      Left lower leg: Edema present.   Skin:     Coloration: Skin is pale.      Findings: Lesion and rash present.   Neurological:      General: No focal deficit present.      Mental Status: He is oriented to person, place, and time. Mental status is at baseline.         Recent Results (from the past 24 hour(s))   POCT Glucose    Collection Time: 08/02/22 10:41 AM   Result Value Ref Range    POC Glucose 122 (H) 65 - 117 mg/dL    Performed by: Theda Sers (TRV RN)    POCT Glucose    Collection Time: 08/02/22  4:34 PM   Result Value Ref Range    POC Glucose 134 (H) 65 - 117 mg/dL    Performed by: Ander Gaster (TRV RN)    POCT Glucose    Collection Time: 08/02/22  8:32 PM   Result Value Ref Range    POC Glucose 167 (H) 65 - 117 mg/dL    Performed by: Tilford Pillar PCT    POCT Glucose    Collection Time: 08/03/22  7:40 AM   Result Value Ref Range    POC Glucose 139 (H) 65 - 117 mg/dL    Performed by: Cherlyn Labella PCT         No intake or output data in the 24 hours ending 08/03/22 1032         Data Review:   No results for input(s): "NA", "K", "BUN", "CREATININE", "WBC", "HGB", "HCT", "PLT", "INR", "TRP", "CHOL", "TRIG", "HDL", "LDLCHOLESTEROL", "LDLCALC", "LABVLDL", "VLDL", "CHOLHDLRATIO" in the last  72 hours.    Invalid input(s): "CHLPL"      No current facility-administered medications on file prior to encounter.     No current outpatient medications on file prior to encounter.        Full Code  Care Plan discussed with:  Patient x    Family      RN     Dialysis RN     Care Manager x       Hospitalist    Consultant:            Comments   >50% of visit spent in counseling and coordination of care y          Signed By: Marina Gravel, MD     August 03, 2022

## 2022-08-03 NOTE — Care Coordination-Inpatient (Addendum)
Transition of Care Plan to SNF/Rehab    Communication to Patient/Family:  Met with patient and family and they are agreeable to the transition plan. The Plan for Transition of Care is related to the following treatment goals: SNF    The Patient and/or patient representative was provided with a choice of provider and agrees  with the discharge plan.      Yes  No []   [x]   A Freedom of choice list was provided with basic dialogue that supports the patient's individualized plan of care/goals and shares the quality data associated with the providers.       Yes [x]  No []     SNF/Rehab Transition:  [facility] and meets criteria for admission.   [facility] [phone] and expected to leave at 3 pm.    Communication to SNF/Rehab:  Bedside RN, Emily,to be been notified to update the transition plan to the facility and call report   Discharge information has been updated on the AVS. And communicated to facility via [employer], or CC link.     CC link opened for [place] room 116A    Nursing Please include all hard scripts for controlled substances, med rec and dc summary, and AVS in packet.     Reviewed and confirmed with facility, Gerald Stabs with [facility], can manage the patient care needs for the following:     [place] with (X) only those applicable:  Medication:  [x] Medications are available at the facility  [] IV Antibiotics    [] Controlled Substance - hard copies available sent.  [] Weekly Labs    Equipment:  [] CPAP/BiPAP  [] Wound Vacuum  [] Foley or Urinary Device  [] PICC/Central Line  [] Nebulizer  [] Ventilator    Treatment:  [] Isolation (for MRSA, VRE, etc.)  [] Surgical Drain Management  [] Tracheostomy Care  [x] Dressing Changes  [] Dialysis with transportation  [] PEG Care  [] Oxygen  [] Daily Weights for Heart Failure    Dietary:  [] Any diet limitations  [] Tube Feedings   [] Total Parenteral Management (TPN)    Financial Resources:  [] Medicaid  Application Completed    [] UAI Completed and copy given to pt/family  and copy given to pt/family  [] A screening has previously been completed.    [] Level II Completed    []  Private pay individual who will not become   financially eligible for Medicaid within 6 months from admission to a [facility] facility.     []  Individual refused to have screening conducted.     [] Medicaid Application Completed    [] The screening denied because it was determined individual did not need/did not qualify for nursing facility level of care.  [x]  Out of state residents seeking direct admission to a [facility] facility. Advised wife to apply for medicaid in [place] for LTSS  []  Individuals who are inpatients of an out of state hospital, or in state or out of state veterans/Military hospital and seek direct admission to a [place] facility  []  Individuals who are pateints or residents of a state owned/operated facility that is licensed by Department of [employer] (DBH[employer]) and seek direct admission to [facility] facility  []  A screening not required for enrollment in [place] services as set out in 12 VAC [id number]  []  [facility]) staff shall perform screenings of the [facility] clients.    Advanced Care Plan:  [] Surrogate Decision Maker of Care  [] POA  [] Communicated Code Status and copy sent.    Other:

## 2022-08-03 NOTE — Discharge Summary (Signed)
Goals of Care/Treatment Preferences    The Palliative Medicine team was consulted as part of your/your loved one's care in the hospital. Our team is a supportive service; we strive to relieve suffering and improve quality of life.    We reviewed advance care planning information, which includes the following:    Primary Decision Maker (Active): Jaime [name],Jaime [name] - Spouse - [phone]  No AMD.     We reviewed / discussed your code status as:   Code Status: Full Code     "Full Code" means perform CPR in the event of cardiac arrest.      "DNR" means do NOT perform CPR in the event of cardiac arrest.      "Partial Code" means you have specific preferences, please discuss with your healthcare team.      Other Instructions: We wish you and Jaime [name] the best as you progress and work your way back home to [facility]!       Because of the importance of this information, we are providing you with a printed copy to share with other healthcare providers after this hospitalization is complete.    Thank you for including Palliative team in the care of Mr. Jaime [name].    Pantelis Azure, [place]  Palliative Medicine [phone])

## 2022-08-03 NOTE — Other (Signed)
08/03/22 0907   Treatment   Time On 0907   Treatment Goal 0-0.5kg in 3hr   Observations & Evaluations   Level of Consciousness 0   Oriented X 4   Heart Rhythm Regular   Respiratory Quality/Effort Unlabored   O2 Device None (Room air)   Bilateral Breath Sounds Clear   Skin Condition/Temp Warm;Dry;Other (comment)  (all toes amputated; CDI surgical site)   Edema None   Vital Signs   BP 132/66   Temp 97.8 F (36.6 C)   Pulse 85   Respirations 15   Pain Assessment   Pain Assessment None - Denies Pain   Technical Checks   Dialysis Machine No. 07   RO Machine Number [id number]   Dialyzer Lot No. [id number]   Tubing Lot Number [phone]   All Connections Secure Yes   NS Bag Yes   Saline Line Double Clamped Yes   Dialyzer Revaclear 300   Prime Volume (mL) 200 mL   ICEBOAT I;C;E;B;O;A;T   RO Machine Log Sheet Completed Yes   Machine Alarm Self Test Completed;Passed   Materials engineer Function   Extracorporeal Advertising copywriter Conductivity 13.9   Manual Conductivity 13.9   Machine Ph 7.4   Bleach Test (Neg) Yes   Bath Temperature 96.8 F (36 C)   Tunneled Hemodialysis Catheter Right Subclavian   Placement Date: 07/01/22   Present on Admission/Arrival: No  Inserted by: Hope Riddell-NP  Insertion Practices: Chlorohexadine skin antisepsis;Maximal barrier precautions;Sterile ultrasound technique;Optimal catheter site selection;Hand hygiene  Orien...   Access Status  Accessed   Continued need for line? Yes   Site Assessment Clean, dry & intact   Venous Lumen Status Brisk blood return;Flushed;Infusing   Arterial Lumen Status Brisk blood return;Flushed;Infusing   Line Care Connections checked and tightened;Ports disinfected   Dressing Type Bacteriocidal;Transparent   Date of Last Dressing Change 07/31/22   Dressing Status Clean, dry & intact   Dressing Change Due 08/04/22   Treatment Initiation   Dialyze Hours 3   Treatment  Initiation Universal Precautions maintained;Lines secured to  patient;Connections secured;Prime given;Venous Parameters set;Arterial Parameters set;Saline line double clamped   During Hemodialysis Assessment   Blood Flow Rate (ml/min) 400 ml/min   Arterial Pressure (mmHg) -140 mmHg   Venous Pressure (mmHg) 110   TMP 40   DFR 600   Comments Treatment initiation   Access Visible Yes   Ultrafiltration Rate (ml/hr) 370 ml/hr   Ultrafiltration Removed (mL) 0 ml/min   Dialysis Bath   K+ (Potassium) 3   Ca+ (Calcium) 2.5   Na+ (Sodium) 140   HCO3 (Bicarb) 35   Primary RN SBAR: Fenton Foy, RN  Patient Education: procedural  Hepatitis B Surface Ag   Date/Time Value Ref Range Status   07/19/2022 06:10 PM <0.10 Index Final     Hep B S Ab   Date/Time Value Ref Range Status   06/29/2022 09:53 AM <3.10 mIU/mL Final

## 2022-08-03 NOTE — Discharge Summary (Signed)
Post discharge note:    CM faxed discharge summary to [facility] at [phone] as requested. Chales Abrahams, MSW

## 2022-08-03 NOTE — Progress Notes (Signed)
Visit was a in 2112 for a follow up visit. Patient was in bed and appeared comfortable. Two nurses were caring for him. He was receptive of visit and indicated he was doing better. He was about to be discharged to another facility for rehab. He appeared in good spirit and pointed to heaven and stated that the good Lord have been watching over him. He affirmed when chaplain inquired, that faith is very important. His thoughts and feelings affirmed, he thanked chaplain for the visit.     Visited by: Hortense Ramal. Div.  Chaplain Paging Service: [phone])

## 2022-08-03 NOTE — Plan of Care (Signed)
Problem: Safety - Adult  Goal: Free from fall injury  Outcome: [place] Resolved Met     Problem: Respiratory - Adult  Goal: Achieves optimal ventilation and oxygenation  Outcome: HH/HSPC Resolved Met     Problem: Pain  Goal: Verbalizes/displays adequate comfort level or baseline comfort level  Outcome: HH/HSPC Resolved Met     Problem: Discharge Planning  Goal: Discharge to home or other facility with appropriate resources  Outcome: [place] Resolved Met     Problem: Chronic Conditions and Co-morbidities  Goal: Patient's chronic conditions and co-morbidity symptoms are monitored and maintained or improved  Outcome: [place] Resolved Met     Problem: Neurosensory - Adult  Goal: Achieves maximal functionality and self care  Outcome: [place] Resolved Met     Problem: Cardiovascular - Adult  Goal: Maintains optimal cardiac output and hemodynamic stability  Outcome: HH/HSPC Resolved Met  Goal: Absence of cardiac dysrhythmias or at baseline  Outcome: [place] Resolved Met     Problem: Gastrointestinal - Adult  Goal: Maintains or returns to baseline bowel function  Outcome: HH/[facility]SPC Resolved Met  Goal: Maintains adequate nutritional intake  Outcome: HH/HSPC Resolved Met     Problem: Genitourinary - Adult  Goal: Absence of urinary retention  Outcome: HH/HSPC Resolved Met     Problem: Metabolic/Fluid and Electrolytes - Adult  Goal: Electrolytes maintained within normal limits  Outcome: HH/[facility]SPC Resolved Met  Goal: Hemodynamic stability and optimal renal function maintained  Outcome: HH/[facility]SPC Resolved Met  Goal: Glucose maintained within prescribed range  Outcome: HH/HSPC Resolved Met     Problem: Skin/Tissue Integrity - Adult  Goal: Skin integrity remains intact  Outcome: HH/HSPC Resolved Met  Goal: Incisions, wounds, or drain sites healing without S/S of infection  Outcome: HH/[facility]SPC Resolved Met  Goal: Oral mucous membranes remain intact  Outcome: HH/HSPC Resolved Met     Problem: Hematologic - Adult  Goal: Maintains hematologic  stability  Outcome: HH/HSPC Resolved Met     Problem: Musculoskeletal - Adult  Goal: Return mobility to safest level of function  Outcome: HH/[facility]SPC Resolved Met  Goal: Maintain proper alignment of affected body part  Outcome: HH/HSPC Resolved Met  Goal: Return ADL status to a safe level of function  Outcome: HH/HSPC Resolved Met     Problem: Skin/Tissue Integrity  Goal: Absence of new skin breakdown  Description: 1.  Monitor for areas of redness and/or skin breakdown  2.  Assess vascular access sites hourly  3.  Every 4-6 hours minimum:  Change oxygen saturation probe site  4.  Every 4-6 hours:  If on nasal continuous positive airway pressure, respiratory therapy assess nares and determine need for appliance change or resting period.  Outcome: [place] Resolved Met     Problem: ABCDS Injury Assessment  Goal: Absence of physical injury  Outcome: [place] Resolved Met     Problem: Safety - Medical Restraint  Goal: Remains free of injury from restraints (Restraint for Interference with Medical Device)  Description: INTERVENTIONS:  1. Determine that other, less restrictive measures have been tried or would not be effective before applying the restraint  2. Evaluate the patient's condition at the time of restraint application  3. Inform patient/family regarding the reason for restraint  4. Q2H: Monitor safety, psychosocial status, comfort, nutrition and hydration  Outcome: HH/HSPC Resolved Met

## 2022-08-03 NOTE — Discharge Summary (Signed)
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- -Now on  MWF schedule  DM  Sacral Wound/pressure ulcer POA  Full code    Consultations:  IP CONSULT TO NEPHROLOGY  IP CONSULT TO PHARMACY  IP CONSULT TO CARDIOLOGY  IP CONSULT TO VASCULAR SURGERY  IP CONSULT TO INFECTIOUS DISEASES  IP CONSULT TO PHARMACY  IP CONSULT TO PALLIATIVE CARE  IP CONSULT TO OCCUPATIONAL THERAPY  IP CONSULT TO CASE MANAGEMENT  IP CONSULT TO PODIATRY  IP CONSULT TO CASE MANAGEMENT  IP CONSULT TO DIETITIAN  IP WOUND CARE NURSE CONSULT TO EVAL  IP WOUND CARE NURSE CONSULT TO EVAL  IP WOUND CARE NURSE CONSULT TO EVAL  IP WOUND CARE NURSE CONSULT TO EVAL  IP CONSULT TO INTERVENTIONAL RADIOLOGY  IP WOUND CARE NURSE CONSULT TO EVAL  IP WOUND CARE NURSE CONSULT TO EVAL  IP WOUND CARE NURSE CONSULT TO EVAL  IP WOUND CARE NURSE CONSULT TO EVAL  IP WOUND CARE NURSE CONSULT TO EVAL  IP WOUND CARE NURSE CONSULT TO EVAL  IP CONSULT TO CASE MANAGEMENT  IP WOUND CARE NURSE CONSULT TO EVAL  IP CONSULT TO INTERVENTIONAL RADIOLOGY  IP WOUND CARE NURSE CONSULT TO EVAL  IP WOUND CARE NURSE CONSULT TO EVAL  IP WOUND CARE NURSE CONSULT TO EVAL  IP WOUND CARE NURSE CONSULT TO EVAL  IP WOUND CARE NURSE CONSULT TO EVAL  IP WOUND CARE NURSE CONSULT TO EVAL      Brief  Admission History/Reason for Admission Per Yevonne Pax, MD:   "76 y/o male PMHx of HTN, HLD and T2DM admitted 7/31 for septic shock in the setting of perforated viscus.  Presented to ED via EMS c/o approximately 2 days of severe abdominal pain, nausea and vomiting. He is currently in town from [place] for the [employer]  ED work- up revealed  B/P 80/42, HR 112, Afebrile, Leukocytosis 16.6, lactic acidosis 10,  Acute Kidney Injury, Cr 4.06. CT A/P demonstrated perforated viscous. Received 2L LR, started on Levophed, Vancomycin/Zosyn. Surgery consulted->to OR for emergent exploratory laparotomy for likely perforated gastric/ duodenal ulcer. 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 Omnicef for 2 more weeks as per ID recommendations        #Septic shock- resolved   D/t perforated viscus  S/p Laparotomy 7/31, with wound vac drainage   Liver abscess s/p IR drainage, hepatic drain to be removed  Bacteremia        S/p ceftriaxone 2  g IV daily   Antibiotic coverage extended to 8 weeks end date 9/29 given persistence of abscess on repeat CT.  Plan to give PO Omnicef for 2 more weeks as per ID recommendations  Pt will require repeat imaging of liver before conclusion of therapy  Repeat CT scan show decrease abscess will need IR follow up regarding hepatic drain - IR consulted for drain removal - removed 9/28 by IR   ID follow up appreciated- signed off now              #Acute hypoxemic respiratory failure:  Intubated 7/31, extubation 8/7  -Reintubated 8/13, extubated on 8/19  -Currently on room air      #Dysphagia:  -resolved      #Acute kidney injury:  -Now on HD MWF  -Continue with HD  -Nephrology following  -Case management  informed the need to set up HD at SNF prior to discharge -Lorriane Shire obtained auth from [facility]-- will be able to accept after HD on Monday per CM              #Diabetes mellitus:  -Sliding scale and lantus  -Monitor FS  -Diabetes team following     #Sacral wound:  -Wound care following    Discharge Exam:  Patient seen and examined by me on discharge day.  Pertinent Findings:  Patient Vitals for the past 24 hrs:   BP Temp Temp src Pulse Resp SpO2   08/03/22 1215 -- -- -- (!) 135 -- --   08/03/22 1210 120/67 97.6 F (36.4 C) -- 82 16 --   08/03/22 1200 127/65 -- -- 78 -- --   08/03/22 1145 121/64 -- -- 78 -- --   08/03/22 1130 130/62 -- -- 79 -- --   08/03/22 1115 (!) 107/57 -- -- 76 -- --   08/03/22 1100 119/60 -- -- 78 -- --   08/03/22 1045 (!) 118/59 -- -- 79 -- --   08/03/22 1030 (!) 112/59 -- -- 78 -- --   08/03/22 1015 114/60 -- -- 79 -- --   08/03/22 1000 (!) 103/59 -- -- 80 -- --   08/03/22 0945 106/62 -- -- 78 -- --   08/03/22 0935 (!) 92/50 -- -- 68 -- --   08/03/22 0930 (!) 73/44 -- -- 76 -- --   08/03/22 0915 119/69 -- -- 79 -- --   08/03/22 0907 132/66 97.8 F (36.6 C) -- 85 15 --   08/03/22 0741 132/84 97.3 F (36.3 C) Axillary 90 25 98 %   08/03/22 0339 132/65 98 F (36.7 C) Oral 90 21 99 %   08/02/22 2353 122/65 98.3 F (36.8 C) Oral 95 20 96 %   08/02/22 1931 134/70 98 F (36.7 C) Oral 99 23 98 %   08/02/22 1527 135/64 98.1 F (36.7 C) Oral 85 18 (!) 88 %       Gen:    Not in distress  Chest: Clear lungs  CVS:   Regular rhythm.  No edema  Abd:  Soft, not distended, not tender  Neuro: awake, moving all exts    Discharge/Recent Laboratory Results:  No results for input(s): "NA", "K", "CL", "CO2", "BUN", "CREATININE", "GLUCOSE", "CALCIUM", "PHOS", "MG" in the last 72 hours.  No results for input(s): "HGB", "HCT", "WBC", "PLT" in the last 72 hours.    Discharge Medications:     Medication List        START taking these medications      amiodarone  200 MG tablet  Commonly known as: CORDARONE  1  tablet by Per NG tube route daily  Start taking on: August 04, 2022     cefdinir 300 MG capsule  Commonly known as: OMNICEF  Take 1 capsule by mouth daily for 12 doses  Start taking on: August 04, 2022     midodrine 5 MG tablet  Commonly known as: PROAMATINE  Take 1 tablet by mouth daily as needed (dialysis)     mirtazapine 7.5 MG tablet  Commonly known as: REMERON  Take 1 tablet by mouth nightly     oxyCODONE 5 MG immediate release tablet  Commonly known as: ROXICODONE  Take 1 tablet by mouth every 4 hours as needed for Pain for up to 3 days. Max Daily Amount: 30 mg     sevelamer 2.4 g Pack packet  Commonly known as: RENVELA  Take 2.4 g by mouth 3 times daily (with meals)            STOP taking these medications      atorvastatin 40 MG tablet  Commonly known as: LIPITOR     hydrALAZINE 25 MG tablet  Commonly known as: APRESOLINE     hydroCHLOROthiazide 25 MG tablet  Commonly known as: HYDRODIURIL     losartan 100 MG tablet  Commonly known as: COZAAR     metFORMIN 500 MG extended release tablet  Commonly known as: GLUCOPHAGE-XR     metFORMIN 500 MG tablet  Commonly known as: GLUCOPHAGE     metoprolol 100 MG tablet  Commonly known as: LOPRESSOR     metoprolol succinate 100 MG extended release tablet  Commonly known as: TOPROL XL               Where to Get Your Medications        These medications were sent to [facility], [address]  [place], [place] [address]      Phone: [phone]   cefdinir 300 MG capsule       Information about where to get these medications is not yet available    Ask your nurse or doctor about these medications  amiodarone 200 MG tablet  midodrine 5 MG tablet  mirtazapine 7.5 MG tablet  oxyCODONE 5 MG immediate release tablet  sevelamer 2.4 g Pack packet             DISPOSITION:    Home with Family:       Home with HH/PT/OT/RN:    SNF/LTC: X [facility]   SAHR:    OTHER:            Code  status: Full code  Recommended diet: cardiac diet, diabetic diet, and low fat, low cholesterol diet  Recommended activity: activity as tolerated  Wound care: Betadine WTD every other day to both feet per podiatry recc      Follow up with:   PCP : No primary care provider on file.  [facility]  [place] [phone]  [place], [place]  [place] [address]  [phone]    Follow up  Infectious disease as needed          Total time in minutes spent coordinating this discharge (includes going over instructions, follow-up, prescriptions, and preparing report for sign off  to her PCP) :  35 minutes

## 2022-08-03 NOTE — Progress Notes (Addendum)
0700: Bedside and Verbal shift change report given to Raquel Sarna, Therapist, sports (oncoming nurse) by Delsa Sale, RN (offgoing nurse). Report included the following information Nurse Handoff Report, Index, Intake/Output, MAR, and Recent Results.      0800: Verbal phone report given to dialysis RN. PRN midodrine given for dialysis     0845: Patient off floor to dialysis     1150: RN attempted to call report to [facility], phone rang for 5 minutes with no answer. RN will call back later.     1205: Verbal phone report given to [facility]    1400: Central line pulled per hospital policy, pressure held for 15 minutes. No signs or symptoms of bleeding. Patient educated on needing to lay flat for 1 hour.     Braden Score 12. All of the following interventions have been implemented to prevent pressure injury:    SKIN ASSESSMENT (S)  Dual skin assessment completed at shift change: Yes  Name of second RN who completed Dual Skin Assessment: Delsa Sale, RN  Picture of wound uploaded to EMR: Yes  Venelex ordered and given per protocol: Yes  Wound care consulted if wounds present: Yes    SURFACE (S)  Stryker air pump or specialty bed ordered: Yes  Type of bed: air bed  Only white chux used with specialty surfaces (no green chux used): NA  Waffle cushion used for chair positioning: Yes    KEEP MOVING (K)  Mobility status (Bedrest, Chairbound, UWA x 1 assist , 2 assist, Max assist): bedrest  Q2 hour turns documented: Yes  Refusals to turn and education provided documented: NA  Device used to float heels: pillows, heel lift  PT/OT consulted: Yes    INCONTINENCE (I)  Incontinence status assessed Q2 hours: Yes  External catheter in use: manwick  Barrier cream in use: zinc    NUTRITION (N)  I/O's documented every 8 hours: Yes  Oral supplements ordered if appropriate: Yes  Nutrition services consulted: Yes    All concerns about new DTI's must be escalated directly to attending MD, charge nurse, [place] and nurse director.     DISCHARGE SUMMARY FROM [facility] NURSE    The  patient is stable for discharge. I have reviewed the discharge instructions with the patient. The patient verbalized understanding. All questions were fully answered. The patient verbalized no complaints.    Hard scripts and medication handouts were given and reviewed with the patient. Appropriate educational materials and medication side effects teaching were also provided.    Cardiac monitor and IV line(s) were removed.     There were no personal belongings, valuables or home medications left at patient's bedside, server or safe.     Atlee Abide, RN, 08/03/2022 3:04 PM

## 2022-08-03 NOTE — Discharge Instructions (Signed)
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   Keep your medication in the bottles provided by the pharmacist and keep a list of the medication names, dosages, and times to be taken in your wallet.   Do not take other medications without consulting your doctor.     Pain Management: per above medications    What to do at Home    Recommended diet:  cardiac diet, diabetic diet, low fat, low cholesterol diet, and renal diet    Recommended activity: activity as tolerated    If you have questions regarding the hospital related prescriptions or hospital related issues please call at [phone].    If you experience any of the following symptoms then please call your primary care physician or return to the emergency room if you cannot get hold of your doctor:  Fever, chills, nausea, vomiting, diarrhea, change in mentation, falling, bleeding, shortness of breath,     Follow Up:  PCP  you are to call and set up an appointment to see them in 7-10 days.      Information obtained by :  I understand that if any problems occur once I am at home I am to contact  my physician.    I understand and acknowledge receipt of the instructions indicated above.                                                                                                                                           Physician's or R.N.'s Signature  Date/Time                                                                                                                                              Patient or Representative Signature                                                          Date/Time

## 2022-08-06 ENCOUNTER — Encounter

## 2022-08-11 ENCOUNTER — Inpatient Hospital Stay: Admit: 2022-08-11 | Discharge: 2022-08-18 | Payer: MEDICARE | Attending: Podiatrist

## 2022-08-11 NOTE — Other (Signed)
08/11/22 1335   Wound 06/15/22 Sacrum Posterior DTI, stage 2 / excoriation secondary to diarrhea   Date First Assessed/Time First Assessed: 06/15/22 2000   Present on Hospital Admission: No  Primary Wound Type: Pressure Injury  Location: Sacrum  Wound Location Orientation: Posterior  Wound Description (Comments): DTI, stage 2 / excoriation secondar...   Wound Image    Wound Etiology Pressure Stage 3   Dressing Status Old drainage noted   Wound Cleansed Cleansed with saline   Dressing/Treatment   (Santyl, gauze, ABD, tape)   Wound Length (cm) 7 cm   Wound Width (cm) 6.5 cm   Wound Depth (cm) 0.2 cm   Wound Surface Area (cm^2) 45.5 cm^2   Change in Wound Size % (l*w) -19.05   Wound Volume (cm^3) 9.1 cm^3   Wound Healing % 40   Wound Assessment Pink/red;Slough   Drainage Amount Moderate (25-50%)   Drainage Description Serosanguinous   Odor None   Peri-wound Assessment Blanchable erythema;Fragile   Margins Undefined edges   Wound Thickness Description not for Pressure Injury Full thickness   Wound 06/19/22 Abdomen Mid Surgical site dehisced on 06/18/22  Now Wound VAC   Date First Assessed/Time First Assessed: 06/19/22 0938   Present on Hospital Admission: No  Wound Approximate Age at First Assessment (Weeks): 3 weeks  Primary Wound Type: Surgical Type  Location: Abdomen  Wound Location Orientation: Mid  Wound Descri...   Wound Image    Wound Etiology Non-Healing Surgical   Dressing Status Old drainage noted   Wound Cleansed Cleansed with saline   Dressing/Treatment   (Hudrofera blue ready, foam border)   Wound Length (cm) 13.5 cm   Wound Width (cm) 1.5 cm   Wound Depth (cm) 0.3 cm   Wound Surface Area (cm^2) 20.25 cm^2   Change in Wound Size % (l*w) 75.89   Wound Volume (cm^3) 6.075 cm^3   Wound Healing % 98   Wound Assessment Slough;Pink/red   Drainage Amount Moderate (25-50%)   Drainage Description Serosanguinous   Odor None   Peri-wound Assessment Intact   Margins Defined edges   Wound Thickness Description not for  Pressure Injury Full thickness   Incision 07/23/22 Foot Anterior;Right;Mid   Date First Assessed/Time First Assessed: 07/23/22 1639   Present on Original Admission: No  Location: Foot  Incision Location Orientation: Anterior;Right;Mid  Incision Description (Comments): staples, xeroform, 4x4's, abd pad, kerlix roll, ace wrap  I...   Wound Image    Dressing Status Old drainage noted   Incision Cleansed Cleansed with saline   Dressing/Treatment   (Gauze, tape)   Incision Length (cm) 0.1   Incision Width (cm) 12.5 cm   Incision Depth (cm) 0.1 cm   Incision Volume (cm^3) 0.13 cm^3   Closure Staples   Margins Approximated   Drainage Amount Scant (moist but unmeasurable)   Drainage Description Serosanguinous   Odor None   Peri-incision Assessment Intact   Incision 07/23/22 Foot Anterior;Left;Mid   Date First Assessed/Time First Assessed: 07/23/22 1639   Present on Original Admission: No  Location: Foot  Incision Location Orientation: Anterior;Left;Mid  Incision Description (Comments): staples, xeroform, 4x4's, abd pad, ace wrap  Incision Outcom...   Wound Image    Dressing Status Intact   Incision Cleansed Cleansed with saline   Dressing/Treatment   (Gauze, tape)   Incision Length (cm) 0.1   Incision Width (cm) 13 cm   Incision Depth (cm) 0.1 cm   Incision Volume (cm^3) 0.13 cm^3   Closure Staples   Margins  Approximated   Drainage Amount Scant (moist but unmeasurable)   Drainage Description Serosanguinous   Odor None   Peri-incision Assessment Intact   BP (!) 117/54   Pulse 94   Temp 97.8 F (36.6 C) (Oral)   Resp 16

## 2022-08-11 NOTE — Discharge Instructions (Signed)
Discharge Instructions/Wound [place]  [address], [place]  [place], [place]  Telephone: [phone]    FAX [phone]  WOUND CARE ORDERS:  Bilateral foot wounds :Cleanse with normal saline or soap and water, apply primary dressing paint with betadine, cover with gauze, secure with tape.   Marland Kitchen  Apply   Pt./pcg/HH nurse to change (freq) 3x Weekly  and as needed for compromise.  Abdomen and sacral wounds - continue with order from [facility] until patient is seen by Dr. Ernestina Patches in [place] next week. F/U in 1 week.    TREATMENT ORDERS:  Follow diet as prescribed:  [x]  Diet as tolerated: Renal Diet as prescribed by facility. []  Calorie Diabetic Diet:Low carb and no Sugar []  No Added Salt:[]  Increase Protein: []  Other:Limit the amount of liquid you are drinking and avoid drinking in between meals              Return Appointment:  [x]  Return Appointment: With Dr. Ernestina Patches  in  1 [facility])        Dr. Ewell Poe 2 weeks for (feet only)  []  Ordered tests:    Electronically signed Winifred Olive. Hassell Done, RN on 08/11/2022 at 1:50 PM     [place] Information: Should you experience any significant changes in your wound(s) or have questions about your wound care, please contact the [place] at [place] 8:00 am - 4:30.  If you need help with your wound outside these hours and cannot wait until we are again available, contact your PCP or go to the hospital emergency room.     PLEASE NOTE: IF YOU ARE UNABLE TO OBTAIN WOUND SUPPLIES, CONTINUE TO USE THE SUPPLIES YOU HAVE AVAILABLE UNTIL YOU ARE ABLE TO REACH [name]. IT IS MOST IMPORTANT TO KEEP THE WOUND COVERED AT ALL TIMES.     Physician Signature:_______________________    Date: ___________ Time:  ____________

## 2022-08-11 NOTE — Progress Notes (Signed)
[facility]  [facility]    Progress Note    Date:08/11/2022       [facility]  Patient Name:Jaime [name]     Date of Birth:[birth year]     Age:76 y.o.    Subjective    Subjective   Pt seen at [facility].  Denies any new complaints.  States he is now at a skilled nursing facility.  States they are performing dressing changes to his bilateral lower extremities every other day.  Denies any recent trauma.  Denies any systemic signs infection.  Denies any other lower extremity complaints      Review of Systems  Unremarkable outside of HPI      Objective         Vitals Last 24 Hours:  TEMPERATURE:  No data recorded  RESPIRATIONS RANGE: No data recorded  PULSE OXIMETRY RANGE: No data recorded  PULSE RANGE: No data recorded  BLOOD PRESSURE RANGE: No data recorded.  ; No data recorded.    I/O (24Hr):  No intake or output data in the 24 hours ending 08/18/22 0843    Objective:  Vital signs: (most recent): Blood pressure (!) 117/54, pulse 94, temperature 97.8 F (36.6 C), temperature source Oral, resp. rate 16.      GEN: Pt is AAOx4 and in NAD. Dressings noted to B/L LE are C/D/I. No family noted at [facility]  DERM: Surgical sites noted to bilateral TMA sites with intact staples.  No clinical signs of infection noted  VASC: Pedal pulses (DP/PT) faintly palpable to B/L LE. CFT<3sec to the distal aspects of both feet. No pedal hair growth noted.  NEURO: Protective and epicritic sensations grossly absent to B/L LE  MSK: (-) POP. TMAs noted to B/L LE. Decreased muscle tone and bulk noted to B/L LE.  PSYCH: Cooperative with normal mood and affect              Labs/Imaging/Diagnostics    Labs:  CBC:No results for input(s): "WBC", "RBC", "HGB", "HCT", "MCV", "RDW", "PLT" in the last 72 hours.  CHEMISTRIES:No results for input(s): "NA", "K", "CL", "CO2", "BUN", "CREATININE", "GLUCOSE", "CA", "PHOS", "MG" in the last 72 hours.  PT/INR:No results for input(s): "PROTIME", "INR" in the last  72 hours.  APTT:No results for input(s): "APTT" in the last 72 hours.  LIVER PROFILE:No results for input(s): "AST", "ALT", "BILIDIR", "BILITOT", "ALKPHOS" in the last 72 hours.    Imaging Last 24 Hours:  No results found.      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  Remaining staples left intact.  Plan for removal in 2 weeks  Strict offloading for wound healing purposes.  He will touch to participate in PT/OT  No antibiotics needed at this time as no clinical signs of infection noted to the feet            Jakaiden Fill A. Darinda [place], DPM, CWSP, AACFAS    [facility]  [address], [address] [place], [place] [address]  O: [phone]  F: [phone]    [facility]  [address] [place], [place] [address]  [place], [place] [address]  O: [phone]  F: [phone]    [facility]  [address], [place] [address]  O: [phone]  F: [phone]    * Available via [facility] 24/7      Electronically signed by Salomon Fick, DPM on 08/18/22 at 8:43 AM EDT

## 2022-08-13 ENCOUNTER — Inpatient Hospital Stay: Admit: 2022-08-13 | Payer: MEDICARE

## 2022-08-13 DIAGNOSIS — K750 Abscess of liver: Secondary | ICD-10-CM

## 2022-08-19 ENCOUNTER — Inpatient Hospital Stay: Admit: 2022-08-19 | Discharge: 2022-08-20 | Payer: MEDICARE | Attending: Surgery

## 2022-08-19 DIAGNOSIS — L89153 Pressure ulcer of sacral region, stage 3: Secondary | ICD-10-CM

## 2022-08-19 NOTE — Discharge Instructions (Addendum)
Discharge Instructions/Wound Care Orders  [place]  [address], [place]  [place], [place]  Telephone: [phone]     FAX [phone]     Wound Care Orders:  Abdominal wound: Cleanse with normal saline, apply  silver alginate, cover with gauze, Care to be done every other day.     Buttock wound, stage 3 pressure wound :Cleanse with normal saline, apply primary dressing Xeroform  cover with secondary dressing Border Foam.    Care to be done every other day and as needed for soiling..      Please be sure he has side rails on his bed to assist him in turning from side to side and off of his back, and buttock.   Please follow up on the alternating pressure mattress tht the physical therapist has talked to him about..   You should never be lying on your back, only turned side to side..  If up in chair, he needs to have a sacral cutout cushion with cutout at the back to float the tailbone.. not in a recliner.     Treatment Orders:   Elevate leg(s) above the level of the heart when sitting, if swelling present.  Avoid prolonged standing in one place.  Wear off-loading shoe/boot on the affected foot when walking.  Do not get dressing/cast wet.  Do not use objects to scratch inside cast/wrap.   Follow diet as prescribed:  []  Diet as tolerated: []  Diabetic Diet: Low carb no sugar []  No Added Salt:  []  Increase Protein: []  Other:Limit the amount of liquid you are drinking and avoid drinking in between meals             Follow-up:  [x]  Return Appointment: With Dr. Ernestina Patches on November 8th   []  Ordered tests:    Electronically signed Geralyn Corwin, RN on 08/19/2022 at 2:29 PM     [place] Information: Should you experience any significant changes in your wound(s) or have questions about your wound care, please contact the [place] at [place] 8:00 am - 4:30.  If you need help with your wound outside these hours and cannot wait until  we are again available, contact your PCP or go to the hospital emergency room.     PLEASE NOTE: IF YOU ARE UNABLE TO OBTAIN WOUND SUPPLIES, CONTINUE TO USE THE SUPPLIES YOU HAVE AVAILABLE UNTIL YOU ARE ABLE TO REACH [name]. IT IS MOST IMPORTANT TO KEEP THE WOUND COVERED AT ALL TIMES.     Physician Signature:_______________________    Date: ___________ Time:  ____________

## 2022-08-19 NOTE — Other (Signed)
08/19/22 1402   Wound 06/15/22 Sacrum Posterior DTI, stage 2 / excoriation secondary to diarrhea   Date First Assessed/Time First Assessed: 06/15/22 2000   Present on Hospital Admission: No  Primary Wound Type: Pressure Injury  Location: Sacrum  Wound Location Orientation: Posterior  Wound Description (Comments): DTI, stage 2 / excoriation secondar...   Wound Image    Wound Etiology Pressure Stage 3   Dressing Status Old drainage noted   Wound Cleansed Soap and water   Wound Length (cm) 8 cm   Wound Width (cm) 6.5 cm   Wound Depth (cm) 0.1 cm   Wound Surface Area (cm^2) 52 cm^2   Change in Wound Size % (l*w) -36.05   Wound Volume (cm^3) 5.2 cm^3   Wound Healing % 66   Wound Assessment Pink/red;Slough   Drainage Amount Moderate (25-50%)   Drainage Description Serosanguinous   Odor None   Peri-wound Assessment Denuded;Blanchable erythema   Margins Undefined edges   Wound Thickness Description not for Pressure Injury Full thickness   Wound 06/19/22 Abdomen Mid Surgical site dehisced on 06/18/22  Now Wound VAC   Date First Assessed/Time First Assessed: 06/19/22 0938   Present on Hospital Admission: No  Wound Approximate Age at First Assessment (Weeks): 3 weeks  Primary Wound Type: Surgical Type  Location: Abdomen  Wound Location Orientation: Mid  Wound Descri...   Wound Etiology Non-Healing Surgical   Dressing Status Old drainage noted   Wound Cleansed Cleansed with saline   Wound Length (cm) 13.1 cm   Wound Width (cm) 1.9 cm   Wound Depth (cm) 0.3 cm   Wound Surface Area (cm^2) 24.89 cm^2   Change in Wound Size % (l*w) 70.37   Wound Volume (cm^3) 7.467 cm^3   Wound Healing % 97   Wound Assessment Pink/red   Drainage Amount Small (< 25%)   Drainage Description Serous   Odor None   Peri-wound Assessment Intact   Margins Defined edges   Wound Thickness Description not for Pressure Injury Full thickness     BP 133/72   Pulse 99   Temp 98.6 F (37 C) (Temporal)   Resp 18

## 2022-08-19 NOTE — Progress Notes (Signed)
Wound care    The patient is a 76 year old man who is being followed at the [facility] wound care center regarding wounds on his feet at the site of right and left transmetatarsal amputations.  The patient had been admitted to [facility] in July 2023 with septic shock related to perforated viscus and had laparotomy.  He was in the ICU for a long period of time.  He started dialysis while in the hospital and continues with dialysis 3 times per week.  The patient is now in a skilled nursing facility.    I saw patient for the first time in the wound care center on 08/19/2022 regarding a remaining wound at his abdominal laparotomy incision site and a sacral pressure wound.    The patient reports that he has sufficient arm movement and strength to turn himself side to side with the use of bed rails, but that his bed in the nursing facility does not have bed rails.        Physical examination    Vital signs blood pressure 133/72 pulse 99 temperature 98.6 respirations 18    The patient is an alert man in no acute distress.    Examination of the patient's abdomen reveals a midline laparotomy site.  The open portion of the wound is 13.1 x 1.9 x 0.3 cm in dimension.  The entire surface is clean granulation tissue.    Examination of the sacral region revealed a butterfly shaped pressure wound into the subcutaneous layer in its deeper portions.  Wound dimensions are 8 x 6.5 x 0.1 cm.  There are partial-thickness areas and full-thickness areas.  The full-thickness areas have pale pink base with mild slough.        Impression:  The patient has stage III sacral pressure wound.                         The patient has a laparotomy wound which is partially open with clean granulation.  The fascia appears to be fully intact.                         The patient's foot wounds at the site of his transmetatarsal amputations are being followed by Dr Ewell Poe.        Plan:    Dressings ordered  - for sacral wound, cover wound with Xeroform.  Apply  bordered foam dressing.  Change dressing every other day and as needed.                                    -For laparotomy wound, cover the wound with silver alginate and bordered foam dressing or dry gauze and tape.  Change dressing every other day and as needed.    Because of the patient's stage III sacral pressure wound, the patient for now should never lie in the supine position.    When the patient is sitting, the patient should sit upright on a memory foam cushion with sacral cut out.  The patient should not sit in a reclined position.    The patient needs to have bed rails on his bed immediately so that he is able to turn himself to right side and to left side and avoid lying in the supine position.    The patient is to have an alternating pressure mattress to assist in the healing of  his stage III sacral pressure wound.      The patient will follow-up in the wound care center regarding the sacral wound and laparotomy wound in 3 weeks.        Diagnosis: Stage III sacral pressure injury, abdominal laparotomy wound with open portion at skin and subcutaneous level    L89.153, S31.109A      Solon Augusta, MD

## 2022-09-01 ENCOUNTER — Inpatient Hospital Stay: Admit: 2022-09-01 | Payer: MEDICARE | Attending: Podiatrist

## 2022-09-01 DIAGNOSIS — I96 Gangrene, not elsewhere classified: Secondary | ICD-10-CM

## 2022-09-01 DIAGNOSIS — L89153 Pressure ulcer of sacral region, stage 3: Secondary | ICD-10-CM

## 2022-09-01 NOTE — Progress Notes (Signed)
[facility]  [facility]    Progress Note    Date:09/01/2022       Room[facility]  Patient Name:Jaime [name]     Date of Birth:July 06, 1946     Age:76 y.o.    Subjective    Subjective   Pt seen at [facility].  Denies any new complaints.  States he is now at a skilled nursing facility.  States they are performing dressing changes to his bilateral lower extremities every other day.  Denies any recent trauma.  Denies any systemic signs infection.  Denies any other lower extremity complaints      Review of Systems  Unremarkable outside of HPI      Objective         Vitals Last 24 Hours:  TEMPERATURE:  Temp  Avg: 97.8 F (36.6 C)  Min: 97.8 F (36.6 C)  Max: 97.8 F (36.6 C)  RESPIRATIONS RANGE: Resp  Avg: 18  Min: 18  Max: 18  PULSE OXIMETRY RANGE: No data recorded  PULSE RANGE: Pulse  Avg: 97  Min: 97  Max: 97  BLOOD PRESSURE RANGE: Systolic (60YTK), ZSW:109 , Min:118 , NAT:557   ; Diastolic (32KGU), RKY:70, Min:60, Max:60    I/O (24Hr):  No intake or output data in the 24 hours ending 09/01/22 1400    Objective  GEN: Pt is AAOx4 and in NAD. Dressings noted to B/L LE are C/D/I. No family noted at [facility]  DERM: Surgical sites noted to bilateral TMA sites with intact staples.  No clinical signs of infection noted  VASC: Pedal pulses (DP/PT) faintly palpable to B/L LE. CFT<3sec to the distal aspects of both feet. No pedal hair growth noted.  NEURO: Protective and epicritic sensations grossly absent to B/L LE  MSK: (-) POP. TMAs noted to B/L LE. Decreased muscle tone and bulk noted to B/L LE.  PSYCH: Cooperative with normal mood and affect              Labs/Imaging/Diagnostics    Labs:  CBC:No results for input(s): "WBC", "RBC", "HGB", "HCT", "MCV", "RDW", "PLT" in the last 72 hours.  CHEMISTRIES:No results for input(s): "NA", "K", "CL", "CO2", "BUN", "CREATININE", "GLUCOSE", "CA", "PHOS", "MG" in the last 72 hours.  PT/INR:No results for input(s): "PROTIME", "INR" in the  last 72 hours.  APTT:No results for input(s): "APTT" in the last 72 hours.  LIVER PROFILE:No results for input(s): "AST", "ALT", "BILIDIR", "BILITOT", "ALKPHOS" in the last 72 hours.    Imaging Last 24 Hours:  No results found.      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 Cicely Ortner, DPM, CWSP, [facility]  [address], [place] [address]  O: [phone]  F: [phone]     [facility]  [address] [place], [place] [address]  O: [phone]  F: [phone]     [facility]  [place],  [address]  [place], [place] [address]  O: [phone]  F: [phone]     * Available via [facility] 24/7      Electronically signed by Rosana Fret, DPM on 09/01/22 at 2:00 PM EDT

## 2022-09-01 NOTE — Discharge Instructions (Addendum)
Discharge Instructions [facility]                      [address], [place] [address]  Telephone: [phone]     FAX [phone]    NAME:  Jaime [name]  DATE OF BIRTH:  03/13/1946  MEDICAL RECORD NUMBER:  [id number]  DATE:  09/01/2022  WOUND CARE ORDERS:  Bilateral foot wounds :Cleanse with soap and water , apply primary dressing Betadine moist to dry  cover with secondary dressing Gauze, Roll Gauze, and Tape .   Pt./pcg/HH nurse to change (freq) Every other day  and as needed for compromise. No changes in wound care to abdominal wound and sacral wound. No further follow-up with Dr. Ewell Poe for foot incisions. Follow-up with Dr. Ernestina Patches on 09/09/22 at 1:45PM.    TREATMENT:  Elevate leg(s) above the level of the heart when sitting, if swelling present.  Avoid prolonged standing in one place.  Wear off-loading shoe/boot on the affected foot when walking.  Do not get dressing/cast wet.  Do not use objects to scratch inside cast/wrap.    Follow Diet as prescribed:   [x]  Diet as tolerated: [x]  Calorie Diabetic Diet: Low carb and no Sugar []  No Added Salt:  []  Increase Protein: []  Limit the amount of liquid you are drinking and avoid drinking in between meals     Return Appointment:  [x]  Return Appointment: With Dr. Ernestina Patches in  1 [facility] [place]) November 8th 1:45PM  []  Nurse Visit : *** days  []  Ordered tests:    Electronically signed Winifred Olive. Hassell Done, RN on 09/01/2022 at 1:24 PM     [place] Information: Should you experience any significant changes in your wound(s) or have questions about your wound care, please contact the [place] at [place] 8:00 am - 4:30.  If you need help with your wound outside these hours and cannot wait until we are again available, contact your PCP or go to the hospital emergency room.   PLEASE NOTE: IF YOU ARE UNABLE TO OBTAIN WOUND SUPPLIES, CONTINUE  TO USE THE SUPPLIES YOU HAVE AVAILABLE UNTIL YOU ARE ABLE TO REACH [name]. IT IS MOST IMPORTANT TO KEEP THE WOUND COVERED AT ALL TIMES.     Physician Signature:_______________________    Date: ___________ Time:  ____________

## 2022-09-09 ENCOUNTER — Inpatient Hospital Stay: Payer: MEDICARE | Attending: Surgery

## 2022-09-17 ENCOUNTER — Inpatient Hospital Stay: Admit: 2022-09-17 | Discharge: 2022-12-14 | Payer: MEDICARE | Attending: Surgery

## 2022-09-17 DIAGNOSIS — L89153 Pressure ulcer of sacral region, stage 3: Secondary | ICD-10-CM

## 2022-09-17 NOTE — Discharge Instructions (Addendum)
Discharge Instructions [facility]                      [address] [place] [address]  Telephone: [phone]     FAX [phone]    NAME:  Jaime [name]  DATE OF BIRTH:  [birth year]  MEDICAL RECORD NUMBER:  [id number]  DATE:  09/17/2022  WOUND CARE ORDERS:  Sacrum wound :Cleanse with soap and water , apply primary dressing Xeroform  cover with secondary dressing Border Foam.   Pt./pcg/HH nurse to change (freq) Every other day  and as needed for compromise.Follow up with provider in 4 Week(s).   Home Health Agency: none  TREATMENT ORDERS:    Turn/reposition every 2 hours when in bed, avoid direct pressure on wound site.  When sitting, shift position or do seat lifts every 15 minutes.  Limit side lying to 30 degree tilt.  Limit HOB elevation to 30 degrees.  Use speciality pressure relief cushion, mattress as appropriate  Follow Diet as prescribed:   []  Diet as tolerated: []  Calorie Diabetic Diet: Low carb and no Sugar []  No Added Salt:  []  Increase Protein: []  Limit the amount of liquid you are drinking and avoid drinking in between meals     Return Appointment:  [x]  Return Appointment: With Dr. Ernestina Patches in  4 Week(s)  []  Nurse Visit : *** days  []  Ordered tests:    Electronically signed Dawayne Patricia, RN on 09/17/2022 at [place] Information: Should you experience any significant changes in your wound(s) or have questions about your wound care, please contact the [place] at [place] 8:00 am - 4:30.  If you need help with your wound outside these hours and cannot wait until we are again available, contact your PCP or go to the hospital emergency room.   PLEASE NOTE: IF YOU ARE UNABLE TO OBTAIN WOUND SUPPLIES, CONTINUE TO USE THE SUPPLIES YOU HAVE AVAILABLE UNTIL YOU ARE ABLE TO REACH [name]. IT IS MOST IMPORTANT TO KEEP THE WOUND COVERED AT ALL TIMES.     Physician  Signature:_______________________    Date: ___________ Time:  ____________

## 2022-09-17 NOTE — Other (Signed)
09/17/22 1013   Wound 06/19/22 Abdomen Mid Surgical site dehisced on 06/18/22  Now Wound VAC   Date First Assessed/Time First Assessed: 06/19/22 0938   Present on Hospital Admission: No  Wound Approximate Age at First Assessment (Weeks): 3 weeks  Primary Wound Type: Surgical Type  Location: Abdomen  Wound Location Orientation: Mid  Wound Descri...   Wound Image    Wound Etiology Non-Healing Surgical   Dressing Status Intact   Wound Cleansed Cleansed with saline   Wound Length (cm) 0.1 cm   Wound Width (cm) 0.1 cm   Wound Depth (cm) 0.1 cm   Wound Surface Area (cm^2) 0.01 cm^2   Change in Wound Size % (l*w) 99.99   Wound Volume (cm^3) 0.001 cm^3   Wound Healing % 100   Wound Assessment Epithelialization   Drainage Amount Scant (moist but unmeasurable)   Drainage Description Serous   Odor None   Peri-wound Assessment Intact   Margins Defined edges   Wound Thickness Description not for Pressure Injury Full thickness   Wound 06/15/22 Sacrum Posterior DTI, stage 2 / excoriation secondary to diarrhea   Date First Assessed/Time First Assessed: 06/15/22 2000   Present on Hospital Admission: No  Primary Wound Type: Pressure Injury  Location: Sacrum  Wound Location Orientation: Posterior  Wound Description (Comments): DTI, stage 2 / excoriation secondar...   Wound Image    Wound Etiology Pressure Stage 3   Dressing Status Old drainage noted   Wound Cleansed Cleansed with saline   Wound Length (cm) 4.8 cm   Wound Width (cm) 6 cm   Wound Depth (cm) 0.1 cm   Wound Surface Area (cm^2) 28.8 cm^2   Change in Wound Size % (l*w) 24.65   Wound Volume (cm^3) 2.88 cm^3   Wound Healing % 81   Wound Assessment Pink/red;Slough   Drainage Amount Moderate (25-50%)   Drainage Description Serous   Odor None   Peri-wound Assessment Fragile   Margins Undefined edges   Wound Thickness Description not for Pressure Injury Full thickness   Pain Assessment   Pain Assessment 0-10   Pain Level 1     BP 108/66   Pulse (!) 101   Temp 97.4 F (36.3  C) (Temporal)   Resp 18

## 2022-09-17 NOTE — Progress Notes (Signed)
Wound care     The patient is a 76 year old man who is being followed at the [facility] wound care center regarding wounds on his feet at the site of right and left transmetatarsal amputations.  The patient had been admitted to [facility] in July 2023 with septic shock related to perforated viscus and had laparotomy.  He was in the ICU for a long period of time.  He started dialysis while in the hospital and continues with dialysis 3 times per week.  The patient is now in a skilled nursing facility.     I saw patient for the first time in the wound care center on 08/19/2022 regarding a remaining wound at his abdominal laparotomy incision site and a sacral pressure wound.  The abdominal site is now healed.     The patient reports that he has sufficient arm movement and strength to turn himself side to side with the use of bed rails, but that his original bed in the nursing facility does not have bed rails.  He now has a hospital bed with alternating pressure mattress and he is able to turn himself from one side to the other utilizing the bed rails.    Dressings as of 08/19/2022:  for sacral wound, cover wound with Xeroform.  Apply bordered foam dressing.  Change dressing every other day and as needed.                                     -For laparotomy wound, cover the wound with silver alginate and bordered foam dressing or dry gauze and tape.  Change dressing every other day and as needed.           Physical examination     The patient is an alert man in no acute distress.     Examination of the patient's abdomen reveals a midline laparotomy site with no open wound.     Examination of the sacral region revealed a pressure wound now only on the left buttock close to the sacrum into the subcutaneous layer.  Wound dimensions are 4.8 x 6 x 0.1 cm with pale pink base with minimal slough.           Impression:  The patient has stage III sacral pressure wound.                          The patient had a laparotomy wound which is  partially open with clean granulation.  That wound is fully healed.                          The patient's foot wounds at the site of his transmetatarsal amputations are being followed by Dr Ewell Poe.           Plan:     Dressings ordered  - for sacral wound, cover wound with Xeroform.  Apply bordered foam dressing.  Change dressing every other day and as needed.                                     -For laparotomy site, no dressing is needed.     Because of the patient's stage III sacral pressure wound, the patient for now should never lie in the supine position.  He  can turn himself from 1 side to the other independently.     When the patient is sitting, the patient should sit upright on a memory foam cushion with sacral cut out.  The patient should not sit in a reclined position.             The patient will follow-up in the wound care center regarding the sacral wound in 4 weeks.           Diagnosis: Stage III sacral pressure injury     L89.153        Solon Augusta, MD

## 2022-09-24 ENCOUNTER — Emergency Department: Admit: 2022-09-24 | Payer: MEDICARE

## 2022-09-24 ENCOUNTER — Inpatient Hospital Stay
Admission: EM | Admit: 2022-09-24 | Discharge: 2022-10-02 | Disposition: E | Payer: Medicare Other | Admitting: Hospitalist

## 2022-09-24 DIAGNOSIS — K6289 Other specified diseases of anus and rectum: Secondary | ICD-10-CM

## 2022-09-24 LAB — CBC WITH AUTO DIFFERENTIAL
Absolute Immature Granulocyte: 0.2 10*3/uL — ABNORMAL HIGH (ref 0.00–0.04)
Basophils %: 0 % (ref 0–1)
Basophils Absolute: 0 10*3/uL (ref 0.0–0.1)
Eosinophils %: 0 % (ref 0–7)
Eosinophils Absolute: 0.1 10*3/uL (ref 0.0–0.4)
Hematocrit: 35 % — ABNORMAL LOW (ref 36.6–50.3)
Hemoglobin: 11.3 g/dL — ABNORMAL LOW (ref 12.1–17.0)
Immature Granulocytes: 1 % — ABNORMAL HIGH (ref 0.0–0.5)
Lymphocytes %: 12 % (ref 12–49)
Lymphocytes Absolute: 2.1 10*3/uL (ref 0.8–3.5)
MCH: 28.9 PG (ref 26.0–34.0)
MCHC: 32.3 g/dL (ref 30.0–36.5)
MCV: 89.5 FL (ref 80.0–99.0)
MPV: 10.9 FL (ref 8.9–12.9)
Monocytes %: 6 % (ref 5–13)
Monocytes Absolute: 1 10*3/uL (ref 0.0–1.0)
Neutrophils %: 81 % — ABNORMAL HIGH (ref 32–75)
Neutrophils Absolute: 14.2 10*3/uL — ABNORMAL HIGH (ref 1.8–8.0)
Nucleated RBCs: 0 PER 100 WBC
Platelets: 106 10*3/uL — ABNORMAL LOW (ref 150–400)
RBC: 3.91 M/uL — ABNORMAL LOW (ref 4.10–5.70)
RDW: 19.6 % — ABNORMAL HIGH (ref 11.5–14.5)
WBC: 17.6 10*3/uL — ABNORMAL HIGH (ref 4.1–11.1)
nRBC: 0 10*3/uL (ref 0.00–0.01)

## 2022-09-24 LAB — COMPREHENSIVE METABOLIC PANEL
ALT: 27 U/L (ref 12–78)
AST: 49 U/L — ABNORMAL HIGH (ref 15–37)
Albumin/Globulin Ratio: 0.5 — ABNORMAL LOW (ref 1.1–2.2)
Albumin: 1.8 g/dL — ABNORMAL LOW (ref 3.5–5.0)
Alk Phosphatase: 134 U/L — ABNORMAL HIGH (ref 45–117)
Anion Gap: 2 mmol/L — ABNORMAL LOW (ref 5–15)
BUN: 34 MG/DL — ABNORMAL HIGH (ref 6–20)
Bun/Cre Ratio: 15 (ref 12–20)
CO2: 32 mmol/L (ref 21–32)
Calcium: 8.9 MG/DL (ref 8.5–10.1)
Chloride: 101 mmol/L (ref 97–108)
Creatinine: 2.23 MG/DL — ABNORMAL HIGH (ref 0.70–1.30)
Est, Glom Filt Rate: 30 mL/min/{1.73_m2} — ABNORMAL LOW (ref >60)
Globulin: 4 g/dL (ref 2.0–4.0)
Glucose: 81 mg/dL (ref 65–100)
Potassium: 3.9 mmol/L (ref 3.5–5.1)
Sodium: 135 mmol/L — ABNORMAL LOW (ref 136–145)
Total Bilirubin: 0.8 MG/DL (ref 0.2–1.0)
Total Protein: 5.8 g/dL — ABNORMAL LOW (ref 6.4–8.2)

## 2022-09-24 LAB — EKG 12-LEAD
Atrial Rate: 91 {beats}/min
Diagnosis: NORMAL
P Axis: 59 degrees
P-R Interval: 150 ms
Q-T Interval: 372 ms
QRS Duration: 76 ms
QTc Calculation (Bazett): 457 ms
R Axis: 4 degrees
T Axis: 25 degrees
Ventricular Rate: 91 {beats}/min

## 2022-09-24 LAB — EXTRA TUBES HOLD

## 2022-09-24 LAB — *Unknown: POC Occult Blood, Fecal: POSITIVE

## 2022-09-24 LAB — LACTIC ACID: Lactic Acid, Plasma: 1.1 MMOL/L (ref 0.4–2.0)

## 2022-09-24 MED ORDER — IOPAMIDOL 76 % IV SOLN
76 % | Freq: Once | INTRAVENOUS | Status: AC | PRN
Start: 2022-09-24 — End: 2022-09-24
  Administered 2022-09-24: 23:00:00 100 mL via INTRAVENOUS

## 2022-09-24 MED ORDER — SODIUM CHLORIDE 0.9 % IV BOLUS
0.9 % | Freq: Once | INTRAVENOUS | Status: AC
Start: 2022-09-24 — End: 2022-09-24
  Administered 2022-09-24: 21:00:00 1000 mL via INTRAVENOUS

## 2022-09-24 MED FILL — ISOVUE-370 76 % IV SOLN: 76 % | INTRAVENOUS | Qty: 100

## 2022-09-24 MED FILL — SODIUM CHLORIDE 0.9 % IV SOLN: 0.9 % | INTRAVENOUS | Qty: 1000

## 2022-09-24 NOTE — ED Notes (Signed)
Report given to [facility].     Lorelee Cover, RN  09/23/2022 1948

## 2022-09-24 NOTE — ED Provider Notes (Signed)
[facility] EMERGENCY [facility]P  EMERGENCY DEPARTMENT ENCOUNTER      Pt Name: Jaime [name]  MRN: [id number]  Birthdate 03/14/1946  Date of evaluation: 25-Sep-2022  Provider: Reita ClicheFrancis Jazz Rogala, MD    CHIEF COMPLAINT     No chief complaint on file.        HISTORY OF PRESENT ILLNESS   (Location/Symptom, Timing/Onset, Context/Setting, Quality, Duration, Modifying Factors, Severity)  Note limiting factors.   Jaime AquasHubert [name] is a 76 y.o. male who presents to the emergency department      The history is provided by the patient and the EMS personnel. No language interpreter was used.   Rectal Bleeding  Quality:  Bright red  Amount:  Moderate  Timing:  Constant  Chronicity:  New  Ineffective treatments:  None tried  Associated symptoms: no abdominal pain, no dizziness, no epistaxis, no fever, no light-headedness and no vomiting    Risk factors: no anticoagulant use        Nursing Notes were reviewed.    REVIEW OF SYSTEMS    (2-9 systems for level 4, 10 or more for level 5)     Review of Systems   Constitutional:  Negative for activity change, chills and fever.   HENT:  Negative for nosebleeds.    Eyes:  Negative for visual disturbance.   Respiratory:  Negative for shortness of breath.    Cardiovascular:  Negative for chest pain and palpitations.   Gastrointestinal:  Positive for hematochezia. Negative for abdominal pain, constipation, diarrhea, nausea and vomiting.   Genitourinary:  Negative for difficulty urinating, dysuria, hematuria and urgency.   Musculoskeletal:  Negative for back pain, neck pain and neck stiffness.   Skin:  Negative for color change.   Allergic/Immunologic: Negative for immunocompromised state.   Neurological:  Negative for dizziness, seizures, syncope, weakness, light-headedness, numbness and headaches.   Psychiatric/Behavioral:  Negative for behavioral problems, confusion, hallucinations, self-injury and suicidal ideas.        Except as noted above the remainder of the review of systems was reviewed and negative.        PAST MEDICAL HISTORY     Past Medical History:   Diagnosis Date    Diabetes mellitus (HCC)     Hemodialysis patient (HCC) 07/12/2022    Hypertension          SURGICAL HISTORY       Past Surgical History:   Procedure Laterality Date    BLADDER SURGERY N/A 06/01/2022    ENDOSCOPY OF ILEAL CONDUIT performed by Guinevere FerrariJoseph Karch, MD at [facility] MAIN OR    CT VISCERAL PERCUTANEOUS DRAIN  06/05/2022    CT VISCERAL PERCUTANEOUS DRAIN 06/05/2022 [facility] RAD CT    FOOT DEBRIDEMENT Bilateral 07/23/2022    BILATERAL TRANSMETATARSAL AMPUTATION performed by Rosana FretJustin Phillingane, DPM at [facility] MA[facility] OR    IR TUNNELED CATHETER PLACEMENT GREATER THAN 5 YEARS  07/01/2022    IR TUNNELED CATHETER PLACEMENT GREATER THAN 5 YEARS 07/01/2022 [facility] [place] [place], APRN - NP [facility] ANGIO IR    LAPAROSCOPY N/A 06/01/2022    LAPAROSCOPY DIAGNOSTIC performed by Guinevere FerrariJoseph Karch, MD at [facility] OR    LAPAROTOMY N/A 06/01/2022    LAPAROTOMY EXPLORATORY performed by Guinevere FerrariJoseph Karch, MD at [facility] MA[facility] OR         CURRENT MEDICATIONS       Previous Medications    ACETAMINOPHEN (TYLENOL) 325 MG TABLET    Take 2 tablets by mouth every 6 hours as needed for Pain    AMIODARONE (CORDARONE)  200 MG TABLET    1 tablet by Per NG tube route daily    MIDODRINE (PROAMATINE) 5 MG TABLET    Take 1 tablet by mouth daily as needed (dialysis)    MIRTAZAPINE (REMERON) 7.5 MG TABLET    Take 1 tablet by mouth nightly    SEVELAMER (RENVELA) 2.4 G PACK PACKET    Take 2.4 g by mouth 3 times daily (with meals)    TRAMADOL (ULTRAM) 50 MG TABLET    Take 1 tablet by mouth every 6 hours as needed for Pain. Max Daily Amount: 200 mg    ZINC SULFATE (ZINCATE) 220 (50 ZN) MG CAPSULE    Take 1 capsule by mouth daily       ALLERGIES     Augmentin [amoxicillin-pot clavulanate], Codeine, and Oxycodone    FAMILY HISTORY     No family history on file.       SOCIAL HISTORY       Social History     Socioeconomic History    Marital status: Married   Tobacco Use    Smoking status: Never    Smokeless tobacco: Never   Vaping  Use    Vaping Use: Never used   Substance and Sexual Activity    Alcohol use: Not Currently       SCREENINGS                               CIWA Assessment  BP: (!) 99/59  Pulse: 92                 PHYSICAL EXAM    (up to 7 for level 4, 8 or more for level 5)     ED Triage Vitals [09/16/2022 1444]   BP Temp Temp src Pulse Respirations SpO2 Height Weight - Scale   (!) 99/59 -- -- 92 18 94 % -- 78.5 kg (173 lb 1 oz)       Physical Exam  Vitals and nursing note reviewed.   Constitutional:       General: He is not in acute distress.     Appearance: Normal appearance. He is ill-appearing. He is not toxic-appearing or diaphoretic.   HENT:      Head: Normocephalic and atraumatic.      Nose: Nose normal.      Mouth/Throat:      Mouth: Mucous membranes are moist.   Eyes:      Extraocular Movements: Extraocular movements intact.      Conjunctiva/sclera: Conjunctivae normal.      Pupils: Pupils are equal, round, and reactive to light.   Cardiovascular:      Rate and Rhythm: Normal rate and regular rhythm.      Comments: Warm and well perfused  Pulmonary:      Effort: Pulmonary effort is normal.      Breath sounds: Normal breath sounds.   Abdominal:      General: Abdomen is flat.      Palpations: Abdomen is soft.      Tenderness: There is no abdominal tenderness.      Comments: Healing midline surgical scar   Musculoskeletal:         General: Normal range of motion.      Cervical back: Normal range of motion.      Right Lower Extremity: Right leg is amputated below ankle.      Left Lower Extremity: Left leg is amputated below  ankle.   Skin:     General: Skin is warm and dry.      Findings: Wound present.   Neurological:      General: No focal deficit present.      Mental Status: He is alert and oriented to person, place, and time.   Psychiatric:         Mood and Affect: Mood normal.         Behavior: Behavior normal.         Thought Content: Thought content normal.         Judgment: Judgment normal.         DIAGNOSTIC RESULTS      EKG: All EKG's are interpreted by the Emergency Department Physician who either signs or Co-signs this chart in the absence of a cardiologist.        RADIOLOGY:   Non-plain film images such as CT, Ultrasound and MRI are read by the radiologist. Plain radiographic images are visualized and preliminarily interpreted by the emergency physician with the below findings:        Interpretation per the Radiologist below, if available at the time of this note:    CT ABDOMEN PELVIS W IV CONTRAST Additional Contrast? None    (Results Pending)         ED BEDSIDE ULTRASOUND:   Performed by ED Physician - none    LABS:  Labs Reviewed   OCCULT BLOOD STOOL IMMUNOASSAY   EXTRA TUBES HOLD   CBC WITH AUTO DIFFERENTIAL   COMPREHENSIVE METABOLIC PANEL   LACTIC ACID   LACTIC ACID   TYPE AND SCREEN   TYPE AND SCREEN       All other labs were within normal range or not returned as of this dictation.    EMERGENCY DEPARTMENT COURSE and DIFFERENTIAL DIAGNOSIS/MDM:   Vitals:    Vitals:    2022-10-10 1444   BP: (!) 99/59   Pulse: 92   Resp: 18   SpO2: 94%   Weight: 78.5 kg (173 lb 1 oz)           Medical Decision Making  Amount and/or Complexity of Data Reviewed  Labs: ordered.  Radiology: ordered.  ECG/medicine tests: ordered.    Risk  Prescription drug management.  Decision regarding hospitalization.    This is a 76 year old male with past medical history, review of systems, physical exam as above, presenting via EMS for complaints of rectal bleeding.  Per report, and patient at bedside he had bowel movement accompanied with a moderate amount of bright red blood earlier today.  Patient denies a history of the same, denies abdominal pain, chart review reveals from late July to early October was admitted to an area hospital for sepsis, likely due to perforated viscus, bilateral lower extremity gangrene, status post transmetatarsal amputation, end-stage renal disease now on dialysis.  Upon arrival patient noted to be hypotensive, afebrile  without tachycardia, satting well on room air.  He has a soft nontender abdomen with well-healing midline surgical scar, bilateral lower extremity transmetatarsal amputations noted, healing sacral ulcers noted, rectal exam positive for obvious recent bleeding without active bleeding.  Plan to obtain CMP, CBC, lactic acid, coags, fecal occult blood test, plan to obtain CT scan of the abdomen and pelvis, provide gentle hydration.  We will reassess, and make a disposition.        REASSESSMENT      Perfect Serve Consult for Admission  7:23 PM    ED Room Number: ER08/08  Patient Name and age:  Jaime [name] 76 y.o.  male  Working Diagnosis:   1. Proctitis    2. Hemorrhage of rectum and anus    3. Skin ulcer of sacrum, unspecified ulcer stage (HCC)        COVID-19 Suspicion: No  Sepsis present:  No  Reassessment needed: Yes  Code Status:  Full Code  Readmission: No  Isolation Requirements: no  Recommended Level of Care: telemetry  Department: [facility] Adult ED - [phone]  Consulting Provider: d/w Dr. Darnelle Spangle      CRITICAL CARE TIME   Total Critical Care time was 50 minutes, excluding separately reportable procedures.  There was a high probability of clinically significant/life threatening deterioration in the patient's condition which required my urgent intervention.      CONSULTS:  None    PROCEDURES:  Unless otherwise noted below, none     Procedures        FINAL IMPRESSION    No diagnosis found.      DISPOSITION/PLAN   DISPOSITION        PATIENT REFERRED TO:  No follow-up provider specified.    DISCHARGE MEDICATIONS:  New Prescriptions    No medications on file     Controlled Substances Monitoring:          No data to display                (Please note that portions of this note were completed with a voice recognition program.  Efforts were made to edit the dictations but occasionally words are mis-transcribed.)    Reita Cliche, MD (electronically signed)  Attending Emergency Physician           Damari Suastegui, Marye Round, MD  09/02/2022 1924

## 2022-09-24 NOTE — ED Triage Notes (Addendum)
Patient presents from [facility] via EMS with complaints of a bloody bowel movement about 30 mins ago. Patient is complaining of lower abdominal pain on both sides. Patient has history of kidney disease. Per patient he is not on any blood thinners     Patient recently had surgery for a perforated intestine

## 2022-09-24 NOTE — H&P (Signed)
History & Physical    Primary Care Provider: No primary care provider on file.  Source of Information: Patient and chart review    History of Presenting Illness:   Jaime [name] is a 76 y.o. male with hx of dm ii, htn, esrd on hd, pvd s/p bilateral TMA, hx of shock 2/2 perforated viscus, liver abscess s/p ir drainage, sacral ulcer who presented to ed with complaints of rectal bleeding.  Patient states he had single episode of rectal bleeding which prompted him to seek ED care.  He denies any previous history of hemorrhoids.  Diverticulosis  The patient denies any fever, chills, chest or abdominal pain, nausea, vomiting, cough, congestion, recent illness, palpitations, or dysuria.    Remarkable vitals on ER Presentation: vss  Labs Remarkable for: wbc 17.6, alb 1.8, cr 2.23  ER Images: ct abd et pelvis:  Rectal wall thickening and mucosal enhancement suggests proctitis. Large  amount stool in the colon with no bowel obstruction.  3. No definite change in left hepatic fluid collection and adjacent ill-defined  stranding.  4. Heterogeneous low density in the pancreatic head and adjacent pancreatic  stranding may be unchanged to prior study performed without IV contrast.  Correlate for clinical signs of acute pancreatitis. Consider follow-up contrast  enhanced pancreas CT to document resolution..  5. Bibasilar atelectasis and pleural effusions  ER Rx: 1l ns bolus, levaquin and flagyl     Review of Systems:  Pertinent items are noted in the History of Present Illness.     Past Medical History:   Diagnosis Date    Diabetes mellitus ([place])     Hemodialysis patient ([place]) 07/12/2022    Hypertension       Past Surgical History:   Procedure Laterality Date    BLADDER SURGERY N/A 06/01/2022    ENDOSCOPY OF ILEAL CONDUIT performed by Melene Plan, MD at [facility]    CT VISCERAL PERCUTANEOUS DRAIN  06/05/2022    CT VISCERAL PERCUTANEOUS DRAIN 06/05/2022 [facility] CT    FOOT DEBRIDEMENT Bilateral 07/23/2022    BILATERAL  TRANSMETATARSAL AMPUTATION performed by Salomon Fick, DPM at [facility]    IR TUNNELED CATHETER PLACEMENT GREATER THAN 5 YEARS  07/01/2022    IR TUNNELED CATHETER PLACEMENT GREATER THAN 5 YEARS 07/01/2022 [place], APRN - NP [facility] ANGIO IR    LAPAROSCOPY N/A 06/01/2022    LAPAROSCOPY DIAGNOSTIC performed by Melene Plan, MD at [place] N/A 06/01/2022    LAPAROTOMY EXPLORATORY performed by Melene Plan, MD at [facility]     Prior to Admission medications    Medication Sig Start Date End Date Taking? Authorizing Provider   traMADol (ULTRAM) 50 MG tablet Take 1 tablet by mouth every 6 hours as needed for Pain. Max Daily Amount: 200 mg    [provider]   acetaminophen (TYLENOL) 325 MG tablet Take 2 tablets by mouth every 6 hours as needed for Pain  Patient not taking: Reported on 09/01/2022    [provider]   zinc sulfate (ZINCATE) 220 (50 Zn) MG capsule Take 1 capsule by mouth daily    [provider]   amiodarone (CORDARONE) 200 MG tablet 1 tablet by Per NG tube route daily 08/04/22   Alain [name], MD   midodrine (PROAMATINE) 5 MG tablet Take 1 tablet by mouth daily as needed (dialysis)  Patient not taking: Reported on 09/01/2022 08/03/22   Alain [name], MD  mirtazapine (REMERON) 7.5 MG tablet Take 1 tablet by mouth nightly 08/03/22   Luther Bradley B, MD   sevelamer (RENVELA) 2.4 g PACK packet Take 2.4 g by mouth 3 times daily (with meals) 08/03/22   Alain [name], MD     Allergies   Allergen Reactions    Augmentin [Amoxicillin-Pot Clavulanate] Hives     Tolerated ceftriaxone 06/2022    Codeine Itching     Unknown reaction    Oxycodone Itching      No family history on file.     SOCIAL HISTORY:  Patient resides:  Independently x   Assisted Living    SNF    With family care       Smoking history:   None x   Former    Chronic      Alcohol history:   None x   Social    Chronic      Ambulates:   Independently x   w/cane    w/walker    w/wc     CODE STATUS:  DNR    Full x   Other      Objective:     Physical Exam:     BP 97/71   Pulse 88   Temp 98.7 F (37.1 C) (Rectal)   Resp 19   Ht 1.727 m (_0 )   Wt 78.5 kg (173 lb 1 oz)   SpO2 97%   BMI 26.31 kg/m         General:  Alert, cooperative, no distress, appears stated age.   Head:  Normocephalic, without obvious abnormality, atraumatic.   Eyes:  Conjunctivae/corneas clear. PERRL, EOMs intact.   Nose: Nares normal. Septum midline. Mucosa normal.        Neck: Supple, symmetrical, trachea midline.       Lungs:   Clear to auscultation bilaterally.   Chest wall:  No tenderness or deformity.   Heart:  Regular rate and rhythm, S1, S2 normal   Abdomen:   Soft, non-tender. Bowel sounds normal. No masses,  No organomegaly.   Extremities: Extremities normal, atraumatic, no cyanosis or edema.   Pulses: 2+ and symmetric all extremities.   Skin: Skin color, texture, turgor normal. No rashes or lesions   Neurologic: CNII-XII grossly intact.      Data Review:     Recent Days:  Recent Labs     09/19/2022  1524   WBC 17.6*   HGB 11.3*   HCT 35.0*   PLT 106*     Recent Labs     09/19/2022  1524   NA 135*   K 3.9   CL 101   CO2 32   BUN 34*   ALT 27     No results for input(s): "PH", "PCO2", "PO2", "HCO3", "FIO2" in the last 72 hours.    24 Hour Results:  Recent Results (from the past 24 hour(s))   EKG 12 Lead    Collection Time: 09/07/2022  2:41 PM   Result Value Ref Range    Ventricular Rate 91 BPM    Atrial Rate 91 BPM    P-R Interval 150 ms    QRS Duration 76 ms    Q-T Interval 372 ms    QTc Calculation (Bazett) 457 ms    P Axis 59 degrees    R Axis 4 degrees    T Axis 25 degrees    Diagnosis       Normal sinus rhythm  Nonspecific T wave  abnormality    When compared with ECG of 17-Jul-2022 16:04,  Left bundle branch block is no longer present  Confirmed by Elba Barman, M.D., Mikeal Hawthorne [phone]) on 09/16/2022 5:27:56 PM     Occult Blood, Fecal    Collection Time: 09/02/2022  3:01 PM   Result Value Ref Range    POC Occult Blood,  Fecal Positive     Extra Tubes Hold    Collection Time: 09/02/2022  3:24 PM   Result Value Ref Range    Specimen HOld 1red 1blue     Comment:        Add-on orders for these samples will be processed based on acceptable specimen integrity and analyte stability, which may vary by analyte.   TYPE AND SCREEN    Collection Time: 09/18/2022  3:24 PM   Result Value Ref Range    Crossmatch expiration date 09/27/2022,2359     ABO/Rh A POSITIVE     Antibody Screen NEG    CBC with Auto Differential    Collection Time: 09/20/2022  3:24 PM   Result Value Ref Range    WBC 17.6 (H) 4.1 - 11.1 K/uL    RBC 3.91 (L) 4.10 - 5.70 M/uL    Hemoglobin 11.3 (L) 12.1 - 17.0 g/dL    Hematocrit 35.0 (L) 36.6 - 50.3 %    MCV 89.5 80.0 - 99.0 FL    MCH 28.9 26.0 - 34.0 PG    MCHC 32.3 30.0 - 36.5 g/dL    RDW 19.6 (H) 11.5 - 14.5 %    Platelets 106 (L) 150 - 400 K/uL    MPV 10.9 8.9 - 12.9 FL    Nucleated RBCs 0.0 0 PER 100 WBC    nRBC 0.00 0.00 - 0.01 K/uL    Neutrophils % 81 (H) 32 - 75 %    Lymphocytes % 12 12 - 49 %    Monocytes % 6 5 - 13 %    Eosinophils % 0 0 - 7 %    Basophils % 0 0 - 1 %    Immature Granulocytes 1 (H) 0.0 - 0.5 %    Neutrophils Absolute 14.2 (H) 1.8 - 8.0 K/UL    Lymphocytes Absolute 2.1 0.8 - 3.5 K/UL    Monocytes Absolute 1.0 0.0 - 1.0 K/UL    Eosinophils Absolute 0.1 0.0 - 0.4 K/UL    Basophils Absolute 0.0 0.0 - 0.1 K/UL    Absolute Immature Granulocyte 0.2 (H) 0.00 - 0.04 K/UL    Differential Type AUTOMATED     CMP    Collection Time: 09/25/2022  3:24 PM   Result Value Ref Range    Sodium 135 (L) 136 - 145 mmol/L    Potassium 3.9 3.5 - 5.1 mmol/L    Chloride 101 97 - 108 mmol/L    CO2 32 21 - 32 mmol/L    Anion Gap 2 (L) 5 - 15 mmol/L    Glucose 81 65 - 100 mg/dL    BUN 34 (H) 6 - 20 MG/DL    Creatinine 2.23 (H) 0.70 - 1.30 MG/DL    Bun/Cre Ratio 15 12 - 20      Est, Glom Filt Rate 30 (L) >60 ml/min/1.75m    Calcium 8.9 8.5 - 10.1 MG/DL    Total Bilirubin 0.8 0.2 - 1.0 MG/DL    ALT 27 12 - 78 U/L    AST 49 (H) 15 - 37 U/L     Alk Phosphatase 134 (H) 45 -  117 U/L    Total Protein 5.8 (L) 6.4 - 8.2 g/dL    Albumin 1.8 (L) 3.5 - 5.0 g/dL    Globulin 4.0 2.0 - 4.0 g/dL    Albumin/Globulin Ratio 0.5 (L) 1.1 - 2.2     Lactic Acid    Collection Time: 09/29/2022  3:24 PM   Result Value Ref Range    Lactic Acid, Plasma 1.1 0.4 - 2.0 MMOL/L         Imaging:     Assessment:     Roben Schliep is a 76 y.o. male with hx of dm ii, htn, esrd on hd, pvd s/p bilateral TMA, hx of shock 2/2 perforated viscus, liver abscess s/p ir drainage, sacral ulcer who is admitted for acute proctitis       Plan:       Acute Proctitis  -c/w levaquin and flagyl  -mivf  -bowel clean out    Obstipation  -bowel regimen: miralax and colace in am  -lactulose overnight    Rectal Bleed  -likely 2/2 #1 and 2 above  -see management    Hypotension  -midodrine tid    ESRD on HD  -hd per nephro            FEN/GI -  ns_0 /hr  Activity - as tolerated  DVT prophylaxis - scds  GI prophylaxis -  none indicated  Disposition - home    CODE STATUS:   full code       Signed By: Levon Hedger, MD     September 24, 2022

## 2022-09-25 LAB — LIPASE: Lipase: 14 U/L (ref 13–75)

## 2022-09-25 MED ORDER — ACETAMINOPHEN 650 MG RE SUPP
650 | Freq: Four times a day (QID) | RECTAL | Status: DC | PRN
Start: 2022-09-25 — End: 2022-09-30

## 2022-09-25 MED ORDER — LEVOFLOXACIN IN D5W 500 MG/100ML IV SOLN
500 MG/100ML | INTRAVENOUS | Status: DC
Start: 2022-09-25 — End: 2022-09-30
  Administered 2022-09-27 – 2022-09-29 (×2): 500 mg via INTRAVENOUS

## 2022-09-25 MED ORDER — SODIUM CHLORIDE 0.9 % IV SOLN
0.9 % | INTRAVENOUS | Status: AC | PRN
Start: 2022-09-25 — End: 2022-09-30

## 2022-09-25 MED ORDER — LEVOFLOXACIN IN D5W 750 MG/150ML IV SOLN
750 MG/150ML | INTRAVENOUS | Status: DC
Start: 2022-09-25 — End: 2022-09-25

## 2022-09-25 MED ORDER — MIRTAZAPINE 15 MG PO TABS
15 MG | Freq: Every evening | ORAL | Status: DC
Start: 2022-09-25 — End: 2022-09-30
  Administered 2022-09-25 – 2022-09-30 (×5): 7.5 mg via ORAL

## 2022-09-25 MED ORDER — HEPARIN (PORCINE) 1000 UNITS/ML INJECTION FOR DIALYSIS CATHETER LOCK
1000 UNIT/ML | INTRAMUSCULAR | Status: DC | PRN
Start: 2022-09-25 — End: 2022-09-30
  Administered 2022-09-25 – 2022-09-30 (×2): 1800 [IU]

## 2022-09-25 MED ORDER — NORMAL SALINE FLUSH 0.9 % IV SOLN
0.9 % | Freq: Two times a day (BID) | INTRAVENOUS | Status: DC
Start: 2022-09-25 — End: 2022-09-30
  Administered 2022-09-25 – 2022-09-30 (×10): 10 mL via INTRAVENOUS

## 2022-09-25 MED ORDER — ONDANSETRON 4 MG PO TBDP
4 MG | Freq: Three times a day (TID) | ORAL | Status: DC | PRN
Start: 2022-09-25 — End: 2022-09-30

## 2022-09-25 MED ORDER — METRONIDAZOLE 500 MG/100ML IV SOLN
500 MG/100ML | INTRAVENOUS | Status: AC
Start: 2022-09-25 — End: 2022-09-24
  Administered 2022-09-25: 01:00:00 500 mg via INTRAVENOUS

## 2022-09-25 MED ORDER — SEVELAMER CARBONATE 2.4 G PO PACK
2.4 g | Freq: Three times a day (TID) | ORAL | Status: DC
Start: 2022-09-25 — End: 2022-09-28
  Administered 2022-09-25: 14:00:00 2.4 g via ORAL

## 2022-09-25 MED ORDER — NORMAL SALINE FLUSH 0.9 % IV SOLN
0.9 % | INTRAVENOUS | Status: AC | PRN
Start: 2022-09-25 — End: 2022-09-30

## 2022-09-25 MED ORDER — LACTULOSE 10 GM/15ML PO SOLN
10 GM/15ML | ORAL | Status: AC
Start: 2022-09-25 — End: 2022-09-25

## 2022-09-25 MED ORDER — ALBUMIN HUMAN 25 % IV SOLN
25 % | INTRAVENOUS | Status: DC | PRN
Start: 2022-09-25 — End: 2022-09-30
  Administered 2022-09-25 – 2022-09-30 (×2): 25 g via INTRAVENOUS

## 2022-09-25 MED ORDER — METRONIDAZOLE 500 MG/100ML IV SOLN
500100 MG/100ML | Freq: Three times a day (TID) | INTRAVENOUS | Status: AC
Start: 2022-09-25 — End: 2022-09-27
  Administered 2022-09-25 – 2022-09-27 (×6): 500 mg via INTRAVENOUS

## 2022-09-25 MED ORDER — POLYETHYLENE GLYCOL 3350 17 G PO PACK
17 g | Freq: Every day | ORAL | Status: DC | PRN
Start: 2022-09-25 — End: 2022-09-30

## 2022-09-25 MED ORDER — ONDANSETRON HCL 4 MG/2ML IJ SOLN
4 MG/2ML | Freq: Four times a day (QID) | INTRAMUSCULAR | Status: DC | PRN
Start: 2022-09-25 — End: 2022-09-30

## 2022-09-25 MED ORDER — LEVOFLOXACIN IN D5W 750 MG/150ML IV SOLN
750 MG/150ML | INTRAVENOUS | Status: AC
Start: 2022-09-25 — End: 2022-09-24
  Administered 2022-09-25: 03:00:00 750 mg via INTRAVENOUS

## 2022-09-25 MED ORDER — ACETAMINOPHEN 325 MG PO TABS
325 | Freq: Four times a day (QID) | ORAL | Status: DC | PRN
Start: 2022-09-25 — End: 2022-09-30
  Administered 2022-09-28: 22:00:00 650 mg via ORAL

## 2022-09-25 MED ORDER — POLYETHYLENE GLYCOL 3350 17 G PO PACK
17 g | Freq: Every day | ORAL | Status: AC
Start: 2022-09-25 — End: 2022-09-30

## 2022-09-25 MED ORDER — SODIUM CHLORIDE 0.9 % IV SOLN
0.9 % | INTRAVENOUS | Status: DC
Start: 2022-09-25 — End: 2022-09-25
  Administered 2022-09-25 (×2): via INTRAVENOUS

## 2022-09-25 MED ORDER — MIDODRINE HCL 5 MG PO TABS
5 MG | Freq: Three times a day (TID) | ORAL | Status: AC
Start: 2022-09-25 — End: 2022-09-27
  Administered 2022-09-25 – 2022-09-27 (×7): 10 mg via ORAL

## 2022-09-25 MED FILL — HEPARIN SODIUM (PORCINE) 1000 UNIT/ML IJ SOLN: 1000 UNIT/ML | INTRAMUSCULAR | Qty: 2

## 2022-09-25 MED FILL — MIDODRINE HCL 5 MG PO TABS: 5 MG | ORAL | Qty: 2

## 2022-09-25 MED FILL — SEVELAMER CARBONATE 2.4 G PO PACK: 2.4 g | ORAL | Qty: 2.4

## 2022-09-25 MED FILL — METRONIDAZOLE 500 MG/100ML IV SOLN: 500 MG/100ML | INTRAVENOUS | Qty: 100

## 2022-09-25 MED FILL — LEVOFLOXACIN IN D5W 750 MG/150ML IV SOLN: 750 MG/150ML | INTRAVENOUS | Qty: 150

## 2022-09-25 MED FILL — LACTULOSE 10 GM/15ML PO SOLN: 10 GM/15ML | ORAL | Qty: 60

## 2022-09-25 MED FILL — MIRTAZAPINE 15 MG PO TABS: 15 MG | ORAL | Qty: 1

## 2022-09-25 MED FILL — ALBUTEIN 25 % IV SOLN: 25 % | INTRAVENOUS | Qty: 100

## 2022-09-25 MED FILL — MONOJECT FLUSH SYRINGE 0.9 % IV SOLN: 0.9 % | INTRAVENOUS | Qty: 40

## 2022-09-25 MED FILL — SODIUM CHLORIDE 0.9 % IV SOLN: 0.9 % | INTRAVENOUS | Qty: 1000

## 2022-09-25 MED FILL — RENVELA 2.4 G PO PACK: 2.4 g | ORAL | Qty: 2.4

## 2022-09-25 NOTE — Progress Notes (Addendum)
Occupational Therapy    Acknowledge OT orders and completed chart review.  Nursing cleared for OT however reports patient is on dialysis at bedside.  Defer OT and will continue to follow for OT eval as appropriate and able.     1600: Discussed patient with PT who had attempted to see patient for eval.  Nursing requesting to defer therapy secondary to hypotension and fatigue s/p HD. Will continue to follow.     Thank you,    Manson Passey, OT

## 2022-09-25 NOTE — Other (Signed)
Primary RN SBAR: A. Severson, RN  Patient Education: Procedural, CVC care / Infection prevention  CWOW:  Hep B sAg - Negtive 09/06/22    PRE-TREATMENT   09/25/22 1115   Vital Signs   BP 111/63   Temp 98.4 F (36.9 C)   Pulse 84   Respirations 16   SpO2 96 %   Pain Assessment   Pain Assessment None - Denies Pain   Treatment   Time On 1115   Treatment Goal 0   Observations & Evaluations   Level of Consciousness 0   Oriented X 4   Heart Rhythm Other (Comment)  (remote telemetry monitoring)   Respiratory Quality/Effort Unlabored   O2 Device None (Room air)   Skin Color Pink;Ecchymosis (comment)   Skin Condition/Temp Dry;Fragile;Warm   Abdomen Inspection Soft   Edema Right upper extremity;Left upper extremity   RUE Edema +3;Pitting   LUE Edema +3;Pitting   Comments Pt alert, slow to respond and lethargic but easily aroused and appropriate in responses. VSS, no distress or pain. Laying in bed with eyes closed.   Technical Checks   Dialysis Machine No. [id number]   RO Machine Number [id number]   Dialyzer Lot No. [id number]   Tubing Lot Number [id number]   All Connections Secure Yes   NS Bag Yes   Saline Line Double Clamped Yes   Dialyzer Revaclear 300   Prime Volume (mL) 200 mL   ICEBOAT I;C;E;B;O;A;T   RO Machine Log Sheet Completed Yes   Machine Alarm Self Test Completed;Passed   Child psychotherapist Function   Extracorporeal Chemical engineer Conductivity 13.8   Manual Conductivity 13.6   Manual Ph 7.4   Bleach Test (Neg) Yes   Bath Temperature 96.8 F (36 C)   Treatment Initiation   Dialyze Hours 3   Treatment  Initiation Universal Precautions maintained;Lines secured to patient;Connections secured;Prime given;Venous Parameters set;Arterial Parameters set;IT consultant engaged;Saline line double clamped;Dialyzer;Revaclear Dialyzer;REV-300   Access   Add Access? Yes   Dialysis Bath   K+ (Potassium)   (3.5)   Ca+ (Calcium) 2.5   Na+ (Sodium) 138   HCO3 (Bicarb) 38   Bicarbonate [place]rate Lot  No. [id number]   Acid Concentrate Lot No. [id number]   Hemodialysis Central Access Right Subclavian   Placement Date: 07/01/22   Orientation: Right  Access Location: Subclavian   Continued need for line? Yes   Site Assessment Clean, dry & intact   CVC Lumen Status Blood return noted;Infusing   Venous Lumen Status Infusing;Blood return noted   Arterial Lumen Status Blood return noted;Infusing   Line Care Ports disinfected;Connections checked and tightened   Dressing Type Sterile dressing, transparent;Bacteriocidal   Date of Last Dressing Change 09/25/22   Dressing Status New dressing applied;Clean, dry & intact   Dressing Intervention New;Dressing changed   Dressing Change Due 09/29/22   Verification by x-ray Other (Comment)  (present on arrival)      Primary RN SBAR: A. Severson, RN  Comments: BFR 400 achieved. Dr. Griselda Miner contacted regarding hypotension, orders given for Albumin. Tolerated HD poorly, Albumin given x1 dose, 100 ml NS flush and UF off entire treatment. At end all blood returned 200 mls NS. Lines flushed with NS, Heparin dwells instilled & sterile hospital approved caps applied x2. Dressing changed per hospital P&P, remains C/D/I on departure. No change / pt stable post treatment. Pt remains in bed with rails up x3, call bell in reach, on remote telemetry monitoring. Education &  report with pt & primary RN.      POST TREATMENT   09/25/22 1415   Vital Signs   BP (!) 110/55   Temp 98.1 F (36.7 C)   Pulse 82   Respirations 16   SpO2 95 %   Weight - Scale 78.8 kg (173 lb 11.6 oz)   Percent Weight Change 0.38   Pain Assessment   Pain Assessment None - Denies Pain   Post-Hemodialysis Assessment   Post-Treatment Procedures Blood returned;Catheter capped, clamped and heparinized x 2 ports   Machine Disinfection Process Acid/Vinegar Clean;Heat Disinfect;Exterior Contractor Volume (ml) 200 ml   Blood Volume Processed (Liters) 65 L   Dialyzer Clearance Moderately streaked    Duration of Treatment (minutes) 180 minutes   Heparin Amount Administered During Treatment (mL) 0 mL   Hemodialysis Intake (ml) 400 ml   Hemodialysis Output (ml) 100 ml   NET Removed (ml) -300   Tolerated Treatment Poor   Interventions Taken Ultrafiltration stopped;Fluid bolus;Medication  (Hypotension - x1 albumin dose, UF off, 100 ml NS flush)   Patient Response to Treatment Tolerated poorly, hypotension throughout and no UF.   Edema Right upper extremity;Left upper extremity   Physician Notified Yes  (hypotension / fluid removal)   Time Off 1415   Patient Disposition Other (Comment)  (remains in room, in bed, call bell in reach)

## 2022-09-25 NOTE — Progress Notes (Signed)
[name]                                                                                          Hospitalist Progress Note  Oletta Cohn, APRN - CNP  Answering service: [phone] OR [phone] from in house phone        Date of Service:  09/25/2022  NAME:  Jaime [name]  DOB:  10/15/1946  MRN:  [id number]       Admission Summary:   76 yr old male with pmhx of htn, pvd, DM 2, ESRD with HD MWF, sacral decubitis ulcer c/o rectal bleeding.  Patient states he had a single episode of rectal bleeding prompting him to come to ED.  Workup in ED showed rectal wall thickening with mucosal enhancement suggestive of proctitis and large amount of stool in colon.  Pancreas also showed left hepatic fluid collection and adjacent ill defined stranding as well as some pancreatic stranding.  Patient has had a perforated viscous in the past with liver abscess which required IR drainage.       Interval history / Subjective:   11/24  No acute issues overnight,  Denies fever chills chest pain shortness of breath, Attests to +flatus and bm     Assessment & Plan:     Acute proctitis  -Levaquin and flagyl iv    Constipation  Rectal Bleed  -GI consulted, appreciate recs  -miralax daily  -possible flexible sigmoidoscopy next week    ESRD on HD MWF  -nephrology consulted, appreciate recs  -dialysis done today  -holding iv fluids  -repeat labs in am  -continue home midodrine and renvela    Mood Disorder  -continue home mirtazapine    PVD s/p toe amputation left foot  Chronic sacral wounds  Left heel ulcer  -wound care consult placed       Code status: full code  Prophylaxis: heparin  Care Plan discussed with: patient  Anticipated Disposition: 1-2 days  Inpatient  Cardiac monitoring: Remote Telemetry     Principal Problem:    Proctitis  Resolved Problems:    * No resolved hospital problems. *         Social Determinants of Health     Tobacco Use: Low Risk  (09/17/2022)    Patient History      Smoking Tobacco Use: Never     Smokeless Tobacco Use: Never     Passive Exposure: Not on file   Alcohol Use: Not on file   Financial Resource Strain: Not on file   Food Insecurity: Not on file   Transportation Needs: Not on file   Physical Activity: Not on file   Stress: Not on file   Social Connections: Not on file   Intimate Partner Violence: Not on file   Depression: Not on file   Housing Stability: Not on file   Interpersonal Safety: Not on file   Utilities: Not on file       Review of Systems:   Pertinent items are noted in HPI.       Vital Signs:    Last 24hrs VS reviewed  since prior progress note. Most recent are:  Vitals:    09/25/22 1315   BP: (!) 84/50   Pulse: 86   Resp:    Temp:    SpO2:        No intake or output data in the 24 hours ending 09/25/22 1325     Physical Examination:     I had a face to face encounter with this patient and independently examined them on 09/25/2022 as outlined below:          General : alert x 3, awake, no acute distress,   HEENT: PEERL, EOMI, moist mucus membrane, TM clear  Neck: supple, no JVD, no meningeal signs  Chest: Clear to auscultation bilaterally   CVS: S1 S2 heard, Capillary refill less than 2 seconds  Abd: soft/ non tender, non distended, BS physiological,   Ext: no clubbing, no cyanosis, no edema, brisk 2+ DP pulses  Neuro/Psych: pleasant mood and affect, CN 2-12 grossly intact, sensory grossly within normal limit, Strength 5/5 in all extremities, DTR 1+ x 4  Skin: warm     Data Review:    Review and/or order of clinical lab test      I have personally and independently reviewed all pertinent labs, diagnostic studies, imaging, and have provided independent interpretation of the same.     Labs:     Recent Labs     10-11-22  1524   WBC 17.6*   HGB 11.3*   HCT 35.0*   PLT 106*     Recent Labs     10/11/2022  1524   NA 135*   K 3.9   CL 101   CO2 32   BUN 34*     Recent Labs     2022-10-11  1524   ALT 27   GLOB 4.0     No results for input(s): "INR", "APTT" in the last  72 hours.    Invalid input(s): "PTP"   No results for input(s): "TIBC", "FERR" in the last 72 hours.    Invalid input(s): "FE", "PSAT"   No results found for: "FOL", "RBCF"   No results for input(s): "PH", "PCO2", "PO2" in the last 72 hours.  No results for input(s): "CPK" in the last 72 hours.    Invalid input(s): "CPKMB", "CKNDX", "TROIQ"  No results found for: "CHOL", "CHOLX", "CHLST", "CHOLV", "HDL", "HDLC", "LDL", "LDLC", "TGLX", "TRIGL"  No results found for: "GLUCPOC"  @LABUA @    Notes reviewed from all clinical/nonclinical/nursing services involved in patient's clinical care. Care coordination discussions were held with appropriate clinical/nonclinical/ nursing providers based on care coordination needs.         Patients current active Medications were reviewed, considered, added and adjusted based on the clinical condition today.      Home Medications were reconciled to the best of my ability given all available resources at the time of admission. Route is PO if not otherwise noted.      Admission Status:[id number]:::1}      Medications Reviewed:     Current Facility-Administered Medications   Medication Dose Route Frequency    heparin (porcine) 1000 UNIT/ML injection 1,800 Units  1,800 Units IntraCATHeter PRN    And    heparin (porcine) 1000 UNIT/ML injection 1,800 Units  1,800 Units IntraCATHeter PRN    [START ON 09/26/2022] levoFLOXacin (LEVAQUIN) 500 MG/100ML infusion 500 mg  500 mg IntraVENous Q48H    albumin human 25% IV solution 25 g  25 g IntraVENous PRN  polyethylene glycol (GLYCOLAX) packet 17 g  17 g Oral Daily    midodrine (PROAMATINE) tablet 10 mg  10 mg Oral TID    mirtazapine (REMERON) tablet 7.5 mg  7.5 mg Oral Nightly    sevelamer (RENVELA) packet 2.4 g  2.4 g Oral TID WC    sodium chloride flush 0.9 % injection 5-40 mL  5-40 mL IntraVENous 2 times per day    sodium chloride flush 0.9 % injection 5-40 mL  5-40 mL IntraVENous PRN    0.9 % sodium chloride infusion   IntraVENous PRN     ondansetron (ZOFRAN-ODT) disintegrating tablet 4 mg  4 mg Oral Q8H PRN    Or    ondansetron (ZOFRAN) injection 4 mg  4 mg IntraVENous Q6H PRN    polyethylene glycol (GLYCOLAX) packet 17 g  17 g Oral Daily PRN    acetaminophen (TYLENOL) tablet 650 mg  650 mg Oral Q6H PRN    Or    acetaminophen (TYLENOL) suppository 650 mg  650 mg Rectal Q6H PRN    0.9 % sodium chloride infusion   IntraVENous Continuous    metronidazole (FLAGYL) 500 mg in 0.9% NaCl 100 mL IVPB premix  500 mg IntraVENous Q8H     ______________________________________________________________________  EXPECTED LENGTH OF STAY: Unable to retrieve estimated LOS  ACTUAL LENGTH OF STAY:          1                 Trinnity Breunig, APRN - CNP

## 2022-09-25 NOTE — Consults (Signed)
[facility]   [address] [place], [place] [address]  Phone: [phone]   [phone]    [email or web address]     Nephrology Progress Note    Patient Name : Jaime [name]     DOB : January 24, 1946     MRN : [id number]  Date of Admission : 09/16/2022  Date of Servive : 09/25/22    CC:  Follow up for ESRD       Assessment and Plan   ESRD-HD  -Dialyzes MWF at [facility]  -Access: [facility]: Placed 07/01/2022  -Serum creatinine has trended down in recent weeks.  However he received IV contrast study yesterday.  Proceed with HD today per schedule.  Watch for renal recovery    Acute proctitis:  Rectal bleed  -Per primary team    Anemia in CKD:  -Continue ESA    Chronic hypotension:  -Continue midodrine    PVD: S/p B/L TMA  DM2  Hx of pancreatitis  Hx of liver abscess s/p drainage  Sacral ulcer     Interval History:  Mr. Jaime [name] is a 76 year old Caucasian male with past medical history of AKI secondary to severe pancreatitis that resulted in ESRD and has been on dialysis since August 2023.  He is currently living at [facility] nursing home and undergoing dialysis Monday Wednesday Friday with last dialysis on Wednesday.    He presented to ER with complaints of abdominal pain.  CT of abdomen and pelvis that showed obstipation, acute proctitis.  Also noted to have acute rectal bleed.    Review of Systems: A comprehensive review of systems was negative.    Current Medications:   Current Facility-Administered Medications   Medication Dose Route Frequency    midodrine (PROAMATINE) tablet 10 mg  10 mg Oral TID    mirtazapine (REMERON) tablet 7.5 mg  7.5 mg Oral Nightly    sevelamer (RENVELA) packet 2.4 g  2.4 g Oral TID WC    sodium chloride flush 0.9 % injection 5-40 mL  5-40 mL IntraVENous 2 times per day    sodium chloride flush 0.9 % injection 5-40 mL  5-40 mL IntraVENous PRN    0.9 % sodium chloride infusion   IntraVENous PRN    ondansetron (ZOFRAN-ODT) disintegrating tablet 4  mg  4 mg Oral Q8H PRN    Or    ondansetron (ZOFRAN) injection 4 mg  4 mg IntraVENous Q6H PRN    polyethylene glycol (GLYCOLAX) packet 17 g  17 g Oral Daily PRN    acetaminophen (TYLENOL) tablet 650 mg  650 mg Oral Q6H PRN    Or    acetaminophen (TYLENOL) suppository 650 mg  650 mg Rectal Q6H PRN    0.9 % sodium chloride infusion   IntraVENous Continuous    [START ON 09/26/2022] levoFLOXacin (LEVAQUIN) 750 MG/150ML infusion 750 mg  750 mg IntraVENous Q48H    metronidazole (FLAGYL) 500 mg in 0.9% NaCl 100 mL IVPB premix  500 mg IntraVENous Q8H      Allergies   Allergen Reactions    Augmentin [Amoxicillin-Pot Clavulanate] Hives     Tolerated ceftriaxone 06/2022    Codeine Itching     Unknown reaction    Oxycodone Itching       Objective:  Vitals:    Vitals:    09/25/22 0339 09/25/22 0600 09/25/22 0753 09/25/22 1000   BP:   109/60    Pulse: 80 84 85 89  Resp:   16    Temp:   97.8 F (36.6 C)    TempSrc:   Oral    SpO2:   95%    Weight:       Height:         Intake and Output:  No intake/output data recorded.  No intake/output data recorded.    Physical Examination:  General: ill looking  Neck:  Supple, no mass  Resp:  Lungs CTA B/L, no wheezing , normal respiratory effort  CV:  RRR,  no murmur or rub, LE edema  GI:  Soft, NT, + BS, no HS megaly  Neurologic:  Non focal    Dialysis Access : [facility] PC    []     High complexity decision making was performed  []     Patient is at high-risk of decompensation with multiple organ involvement    Lab Data Personally Reviewed: I have reviewed all the pertinent labs, microbiology data and radiology studies during assessment.    Labs:  Recent Labs     10-02-2022  1524   NA 135*   K 3.9   CL 101   CO2 32   GLUCOSE 81   BUN 34*   CREATININE 2.23*   CALCIUM 8.9       Recent Labs     02-Oct-2022  1524   WBC 17.6*   RBC 3.91*   HGB 11.3*   HCT 35.0*   MCV 89.5   MCH 28.9   MCHC 32.3   RDW 19.6*   PLT 106*   MPV 10.9     Recent Labs     10/02/2022  1524   GLOB 4.0     No results for input(s): "INR",  "APTT" in the last 72 hours.    Invalid input(s): "PTP"   No results for input(s): "CPK", "CKMB", "TROPONINI" in the last 72 hours.    Invalid input(s): "B-NP"  Invalid input(s): "PHI", "PCO2I", "PO2I", "FIO2I"     Ventilator:       Microbiology:  No results found for: "SDES"  No components found for: "CULT"      I have reviewed the flowsheets.  Chart and Pertinent Notes have been reviewed.   No change in PMH ,family and social history from Consult note.      Sunday Shams, MD  [facility]

## 2022-09-25 NOTE — Progress Notes (Addendum)
Physical Therapy - defer  Acknowledged PT orders and completed chart review.  Nursing cleared pt for therapy however reports patient is on dialysis at bedside.   Will check back later as able.      1557 - Checked back for therapy evaluation.   Received pt sleeping in bed w/ RN and wound care RN working w/ hime.   RN is requesting therapy defer at this time d/t pt fatigue after dialysis and soft BP.    Will check back tomorrow for evaluation as appropriate.

## 2022-09-25 NOTE — Consults (Signed)
[facility]  Ruben Reason. Dagmar Hait, M.D.  [phone]          GASTROENTEROLOGY CONSULTATION NOTE      NAME:  Jaime [name]   DOB:   [birth year]   MRN:   [id number]       Referring Physician: Nance Pear    Consult Date: 09/25/2022     Chief Complaint: abnormal CT scan    History of Present Illness:  Patient is a 76 y.o. with a history of HTN, ESRD on HD, DM, 05/2022 ex lap for perforation without obvious cause, liver abscess s/p drain consulted for proctitis. Pt is poor historian. States he had out of state colonoscopy years ago without concerning findings. No constipation or diarrhea. Possible BRBPR.    PMH:  Past Medical History:   Diagnosis Date    Diabetes mellitus (HCC)     Hemodialysis patient (HCC) 07/12/2022    Hypertension        PSH:  Past Surgical History:   Procedure Laterality Date    BLADDER SURGERY N/A 06/01/2022    ENDOSCOPY OF ILEAL CONDUIT performed by Guinevere Ferrari, MD at [facility] MAIN OR    CT VISCERAL PERCUTANEOUS DRAIN  06/05/2022    CT VISCERAL PERCUTANEOUS DRAIN 06/05/2022 [facility] RAD CT    FOOT DEBRIDEMENT Bilateral 07/23/2022    BILATERAL TRANSMETATARSAL AMPUTATION performed by Rosana Fret, DPM at [facility] OR    IR TUNNELED CATHETER PLACEMENT GREATER THAN 5 YEARS  07/01/2022    IR TUNNELED CATHETER PLACEMENT GREATER THAN 5 YEARS 07/01/2022 [facility] - Cah Adelino, APRN - NP [facility] ANGIO IR    LAPAROSCOPY N/A 06/01/2022    LAPAROSCOPY DIAGNOSTIC performed by Guinevere Ferrari, MD at [facility] OR    LAPAROTOMY N/A 06/01/2022    LAPAROTOMY EXPLORATORY performed by Guinevere Ferrari, MD at [facility]       Allergies:  Allergies   Allergen Reactions    Augmentin [Amoxicillin-Pot Clavulanate] Hives     Tolerated ceftriaxone 06/2022    Codeine Itching     Unknown reaction    Oxycodone Itching       Home Medications:  Prior to Admission Medications   Prescriptions Last Dose Informant Patient Reported? Taking?   acetaminophen (TYLENOL) 325 MG tablet   Yes No   Sig: Take 2 tablets by mouth every 6 hours as needed for  Pain   Patient not taking: Reported on 09/01/2022   amiodarone (CORDARONE) 200 MG tablet   No No   Sig: 1 tablet by Per NG tube route daily   midodrine (PROAMATINE) 5 MG tablet   No No   Sig: Take 1 tablet by mouth daily as needed (dialysis)   Patient not taking: Reported on 09/01/2022   mirtazapine (REMERON) 7.5 MG tablet   No No   Sig: Take 1 tablet by mouth nightly   sevelamer (RENVELA) 2.4 g PACK packet   No No   Sig: Take 2.4 g by mouth 3 times daily (with meals)   traMADol (ULTRAM) 50 MG tablet   Yes No   Sig: Take 1 tablet by mouth every 6 hours as needed for Pain. Max Daily Amount: 200 mg   zinc sulfate (ZINCATE) 220 (50 Zn) MG capsule   Yes No   Sig: Take 1 capsule by mouth daily      Facility-Administered Medications: None       Hospital Medications:  Current Facility-Administered Medications   Medication Dose Route Frequency    midodrine (PROAMATINE)  tablet 10 mg  10 mg Oral TID    mirtazapine (REMERON) tablet 7.5 mg  7.5 mg Oral Nightly    sevelamer (RENVELA) packet 2.4 g  2.4 g Oral TID WC    sodium chloride flush 0.9 % injection 5-40 mL  5-40 mL IntraVENous 2 times per day    sodium chloride flush 0.9 % injection 5-40 mL  5-40 mL IntraVENous PRN    0.9 % sodium chloride infusion   IntraVENous PRN    ondansetron (ZOFRAN-ODT) disintegrating tablet 4 mg  4 mg Oral Q8H PRN    Or    ondansetron (ZOFRAN) injection 4 mg  4 mg IntraVENous Q6H PRN    polyethylene glycol (GLYCOLAX) packet 17 g  17 g Oral Daily PRN    acetaminophen (TYLENOL) tablet 650 mg  650 mg Oral Q6H PRN    Or    acetaminophen (TYLENOL) suppository 650 mg  650 mg Rectal Q6H PRN    0.9 % sodium chloride infusion   IntraVENous Continuous    [START ON 09/26/2022] levoFLOXacin (LEVAQUIN) 750 MG/150ML infusion 750 mg  750 mg IntraVENous Q48H    metronidazole (FLAGYL) 500 mg in 0.9% NaCl 100 mL IVPB premix  500 mg IntraVENous Q8H       Family History:  No family history on file.    Social History:  Social History     Tobacco Use    Smoking status:  Never    Smokeless tobacco: Never   Substance Use Topics    Alcohol use: Not Currently       Review of Systems:  -Fever  -Chills  -HA  -Dizziness  +Good mood  -Rash  -Jaundice  -Joint pain  +Weight loss  -Odynophagia  -Dysphagia  -Heartburn  -Chest pain  -Dyspnea  -N/V  -Hematemesis  -Abd pain  -Diarrhea  -Constipation  -Dysuria  -Melena  +Hematochezia  The rest of the review of systems is negative except per HPI      Objective:   Patient Vitals for the past 8 hrs:   BP Temp Temp src Pulse Resp SpO2   09/25/22 1000 -- -- -- 89 -- --   09/25/22 0753 109/60 97.8 F (36.6 C) Oral 85 16 95 %   09/25/22 0600 -- -- -- 84 -- --   09/25/22 0339 -- -- -- 80 -- --     No intake/output data recorded.  No intake/output data recorded.      PHYSICAL EXAM:  General: Alert, in no acute distress    HEENT: Anicteric conjunctiva.  Lungs:            CTA Bilaterally  Heart:  Normal S1, S2    Abdomen: Soft, Non distended, midline incision, Non tender.  Normoactive bowel sounds, no rebound/guarding  MSK:   Poor muscle tone  Skin:   Warm to touch  Extremities: Negative bilateral pedal edema   Psych:   Good insight. Not anxious nor agitated.    Lab Data Reviewed:   Recent Labs     09/18/2022  1524   WBC 17.6*   HGB 11.3*   HCT 35.0*   PLT 106*     Recent Labs     09/20/2022  1524   NA 135*   K 3.9   CL 101   CO2 32   BUN 34*     Recent Labs     09/06/2022  1524   GLOB 4.0     No results for input(s): "INR", "APTT" in the  last 72 hours.    Invalid input(s): "PTP"   No results for input(s): "TIBC", "FERR" in the last 72 hours.    Invalid input(s): "FE", "PSAT"  No results for input(s): "CPK", "CKMB" in the last 72 hours.    Invalid input(s): "TOPONINI"    See Electronic Medical Record for all procedure/radiology reports and details which were not copied into this note but were reviewed prior to the creation of the Plan.  CT: IMPRESSION:     1. No CT evidence of acute GI bleed.  2. Rectal wall thickening and mucosal enhancement suggests proctitis.  Large  amount stool in the colon with no bowel obstruction.  3. No definite change in left hepatic fluid collection and adjacent ill-defined  stranding.  4. Heterogeneous low density in the pancreatic head and adjacent pancreatic  stranding may be unchanged to prior study performed without IV contrast.  Correlate for clinical signs of acute pancreatitis. Consider follow-up contrast  enhanced pancreas CT to document resolution..  5. Bibasilar atelectasis and pleural effusions    Assessment:   Rectal wall thickening: differential includes proctitis, stercoral ulcer, or malignancy  Pancreatitis stranding: possible pancreatitis history. No abdominal pain.     Patient Active Problem List   Diagnosis    Gastric perforation (HCC)    Type 2 diabetes mellitus with hyperglycemia, without long-term current use of insulin (HCC)    Intestinal obstruction (HCC)    Severe sepsis (HCC)    Septic shock (HCC)    E coli infection    Liver abscess    Pneumoperitoneum    Acute respiratory failure with hypoxia (HCC)    AKI (acute kidney injury) ([place])    Multi-organ failure with heart failure (HCC)    Thrombocytopenia (HCC)    Bacteremia due to Escherichia coli    Hepatic abscess    Peripheral arterial disease (HCC)    Hyperbilirubinemia    Gram negative sepsis (HCC)    Septicemia (HCC)    Aspiration pneumonia of both lower lobes (HCC)    Gangrene of toe of both feet ([place])    Bandemia    Acute pancreatitis    Wound dehiscence    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  Please call with any questions. Weekend coverage to follow.     Signed by: Ernestina Columbia, MD         09/25/2022  11:36 AM

## 2022-09-25 NOTE — Wound Image (Signed)
WOCN Note:     New consult for multiple wounds    Jaime [name] is a 76 yr old male with pmhx of htn, pvd, DM 2, ESRD with HD MWF, sacral decubitis ulcer  who presented to ED with c/o rectal bleeding.  Admitted on 09/21/2022     Lab Results   Component Value Date/Time    WBC 17.6 (H) 09/23/2022 03:24 PM    LABA1C 7.7 (H) 06/02/2022 12:22 PM    POCGLU 139 (H) 08/03/2022 07:40 AM    POCGLU 167 (H) 08/02/2022 08:32 PM    HGB 11.3 (L) 09/14/2022 03:24 PM    HCT 35.0 (L) 09/25/2022 03:24 PM    PLT 106 (L) 09/08/2022 03:24 PM        Tobacco Use      Smoking status: Never      Smokeless tobacco: Never     ADULT DIET; Full Liquid     Assessment:   Alert and appropriately communicative. Reports generalized pain.   Moderate - Total assist in repositioning for visual assessment of sacrum  Wearing briefs, cleansed of small mushy brown stool  Surface: LAL mattress  Left heel intact, with hyperkeratosis and without erythema.  Heels offloaded with boots.  No Palpable DP or PT pulses bilaterally    1. POA Unstageable Pressure Injury to Right Heel  Posterior heel with 1 x 0.6 x <0.1 cm  100% tan   Small serous exudate noted  Periwound intact & with ~ 0.2 cm erythema    Tx: cleansed with Vashe damp gauze, applied TheraHoney, covered with foam dressing      2. POA Wounds to Bilateral Buttocks    Kissing wounds  Left: 1.8 x 1 x <0.1 cm  Right:    Superior - 2 x 1 x <0.1 cm    Inferior - 2 x 2 x <0.1 cm   All three with 100% tan/brown adherent slough, small serous exudate and periwounds with pink re-epithelialized skin  Buttocks with lavender blanching erythema  Tx: cleansed with Vashe damp gauze, applied hydrocolloids and then foam dressing     3. POA Healing TMA Site to Left Foot  Three intact dry crusts  Tx: painted with betadine, left open to air    Wound Recommendations:    Right heel: Daily - cleanse with Vashe damp gauze, applied TheraHoney, cover with foam dressing,  offload heels at all times  Buttocks: Maintain hydrocolloids. If drainage overwhelms dressing / spontaneous removal / soiling under dressing / dressing has been in place for 7 days - remove, cleanse with Vashe and apply new hydrocolloids  Left TMA: Daily - Paint with betadine, leave open to air     PI Prevention:  Turn/reposition approximately every 2 hours  Offload heels with heels hanging off end of pillow at all times while in bed.  Sacral Foam dressing: lift to assess regularly; change as needed. Discontinue if incontinence is frequently soiling dressing.     Low Air Loss mattress  in place. Use only flat sheet and one incontinence pad.     Discussed assessment and plan with patient and RN, Thurmond Butts.    Transition of Care: Plan to follow weekly and as needed while admitted to hospital.      Lawson Radar  MSN, RN  [facility] [place]   Available through [facility]

## 2022-09-26 ENCOUNTER — Inpatient Hospital Stay: Payer: MEDICARE

## 2022-09-26 ENCOUNTER — Inpatient Hospital Stay: Admit: 2022-09-26 | Payer: MEDICARE

## 2022-09-26 LAB — CBC WITH AUTO DIFFERENTIAL
Basophils %: 0 % (ref 0–1)
Basophils %: 0 % (ref 0–1)
Basophils Absolute: 0 10*3/uL (ref 0.0–0.1)
Basophils Absolute: 0 10*3/uL (ref 0.0–0.1)
Eosinophils %: 1 % (ref 0–7)
Eosinophils %: 1 % (ref 0–7)
Eosinophils Absolute: 0.2 10*3/uL (ref 0.0–0.4)
Eosinophils Absolute: 0.2 10*3/uL (ref 0.0–0.4)
Hematocrit: 27.1 % — ABNORMAL LOW (ref 36.6–50.3)
Hematocrit: 26.6 % — ABNORMAL LOW (ref 36.6–50.3)
Hemoglobin: 9.2 g/dL — ABNORMAL LOW (ref 12.1–17.0)
Hemoglobin: 8.9 g/dL — ABNORMAL LOW (ref 12.1–17.0)
Immature Granulocytes %: 1 % — ABNORMAL HIGH (ref 0.0–0.5)
Immature Granulocytes %: 1 % — ABNORMAL HIGH (ref 0.0–0.5)
Immature Granulocytes Absolute: 0.2 10*3/uL — ABNORMAL HIGH (ref 0.00–0.04)
Immature Granulocytes Absolute: 0.2 10*3/uL — ABNORMAL HIGH (ref 0.00–0.04)
Lymphocytes %: 10 % — ABNORMAL LOW (ref 12–49)
Lymphocytes %: 11 % — ABNORMAL LOW (ref 12–49)
Lymphocytes Absolute: 2.1 10*3/uL (ref 0.8–3.5)
Lymphocytes Absolute: 2.1 10*3/uL (ref 0.8–3.5)
MCH: 29.7 PG (ref 26.0–34.0)
MCH: 29 PG (ref 26.0–34.0)
MCHC: 33.9 g/dL (ref 30.0–36.5)
MCHC: 33.5 g/dL (ref 30.0–36.5)
MCV: 87.4 FL (ref 80.0–99.0)
MCV: 86.6 FL (ref 80.0–99.0)
MPV: 11.5 FL (ref 8.9–12.9)
MPV: 10.5 FL (ref 8.9–12.9)
Monocytes %: 4 % — ABNORMAL LOW (ref 5–13)
Monocytes %: 5 % (ref 5–13)
Monocytes Absolute: 0.9 10*3/uL (ref 0.0–1.0)
Monocytes Absolute: 1 10*3/uL (ref 0.0–1.0)
Neutrophils %: 84 % — ABNORMAL HIGH (ref 32–75)
Neutrophils %: 82 % — ABNORMAL HIGH (ref 32–75)
Neutrophils Absolute: 18 10*3/uL — ABNORMAL HIGH (ref 1.8–8.0)
Neutrophils Absolute: 15.7 10*3/uL — ABNORMAL HIGH (ref 1.8–8.0)
Nucleated RBCs: 0.1 PER 100 WBC — ABNORMAL HIGH
Nucleated RBCs: 0 PER 100 WBC
Platelets: 84 10*3/uL — ABNORMAL LOW (ref 150–400)
Platelets: 87 10*3/uL — ABNORMAL LOW (ref 150–400)
RBC: 3.1 M/uL — ABNORMAL LOW (ref 4.10–5.70)
RBC: 3.07 M/uL — ABNORMAL LOW (ref 4.10–5.70)
RDW: 19.6 % — ABNORMAL HIGH (ref 11.5–14.5)
RDW: 19.4 % — ABNORMAL HIGH (ref 11.5–14.5)
WBC: 21.4 10*3/uL — ABNORMAL HIGH (ref 4.1–11.1)
WBC: 19.2 10*3/uL — ABNORMAL HIGH (ref 4.1–11.1)
nRBC: 0.03 10*3/uL — ABNORMAL HIGH (ref 0.00–0.01)
nRBC: 0 10*3/uL (ref 0.00–0.01)

## 2022-09-26 LAB — POC CHEMISTRY (NA,K,ICA,GLU,CALC HCT/HGB,LACTATE,CREA,CL)
Anion Gap, POC: 8 — ABNORMAL LOW (ref 10–20)
POC Chloride: 102 MMOL/L (ref 100–108)
POC Creatinine: 1.8 MG/DL — ABNORMAL HIGH (ref 0.6–1.3)
POC Glucose: 67 MG/DL — ABNORMAL LOW (ref 74–106)
POC Ionized Calcium: 1.3 mmol/L (ref 1.12–1.32)
POC Lactic Acid: 0.46 mmol/L (ref 0.40–2.00)
POC Potassium: 3.6 MMOL/L (ref 3.5–5.5)
POC Sodium: 139 MMOL/L (ref 136–145)
POC TCO2: 29 MMOL/L — ABNORMAL HIGH (ref 19–24)
eGFR, POC: 39 mL/min/{1.73_m2} — ABNORMAL LOW (ref >60)

## 2022-09-26 LAB — HEMOGLOBIN AND HEMATOCRIT
Hematocrit: 27 % — ABNORMAL LOW (ref 36.6–50.3)
Hematocrit: 30.7 % — ABNORMAL LOW (ref 36.6–50.3)
Hemoglobin: 9.3 g/dL — ABNORMAL LOW (ref 12.1–17.0)
Hemoglobin: 10.3 g/dL — ABNORMAL LOW (ref 12.1–17.0)

## 2022-09-26 LAB — COMPREHENSIVE METABOLIC PANEL
ALT: 18 U/L (ref 12–78)
AST: 29 U/L (ref 15–37)
Albumin/Globulin Ratio: 0.6 — ABNORMAL LOW (ref 1.1–2.2)
Albumin: 1.8 g/dL — ABNORMAL LOW (ref 3.5–5.0)
Alk Phosphatase: 114 U/L (ref 45–117)
Anion Gap: 4 mmol/L — ABNORMAL LOW (ref 5–15)
BUN/Creatinine Ratio: 14 (ref 12–20)
BUN: 27 MG/DL — ABNORMAL HIGH (ref 6–20)
CO2: 30 mmol/L (ref 21–32)
Calcium: 8.3 MG/DL — ABNORMAL LOW (ref 8.5–10.1)
Chloride: 103 mmol/L (ref 97–108)
Creatinine: 1.9 MG/DL — ABNORMAL HIGH (ref 0.70–1.30)
Est, Glom Filt Rate: 36 mL/min/{1.73_m2} — ABNORMAL LOW (ref >60)
Globulin: 3.1 g/dL (ref 2.0–4.0)
Glucose: 81 mg/dL (ref 65–100)
Potassium: 3.6 mmol/L (ref 3.5–5.1)
Sodium: 137 mmol/L (ref 136–145)
Total Bilirubin: 1.2 MG/DL — ABNORMAL HIGH (ref 0.2–1.0)
Total Protein: 4.9 g/dL — ABNORMAL LOW (ref 6.4–8.2)

## 2022-09-26 LAB — LACTIC ACID: Lactic Acid, Plasma: 1 MMOL/L (ref 0.4–2.0)

## 2022-09-26 LAB — POCT GLUCOSE
POC Glucose: 59 mg/dL — ABNORMAL LOW (ref 65–117)
POC Glucose: 90 mg/dL (ref 65–117)
POC Glucose: 76 mg/dL (ref 65–117)
POC Glucose: 89 mg/dL (ref 65–117)
POC Glucose: 100 mg/dL (ref 65–117)

## 2022-09-26 LAB — EKG 12-LEAD
Atrial Rate: 69 {beats}/min
Diagnosis: NORMAL
P Axis: 48 degrees
P-R Interval: 162 ms
Q-T Interval: 460 ms
QRS Duration: 86 ms
QTc Calculation (Bazett): 492 ms
R Axis: 4 degrees
T Axis: 9 degrees
Ventricular Rate: 69 {beats}/min

## 2022-09-26 LAB — PREPARE RBC (CROSSMATCH)

## 2022-09-26 MED ORDER — IOPAMIDOL 76 % IV SOLN
76 % | Freq: Once | INTRAVENOUS | Status: AC | PRN
Start: 2022-09-26 — End: 2022-09-26

## 2022-09-26 MED ORDER — DEXTROSE 10 % IV BOLUS
INTRAVENOUS | Status: DC | PRN
Start: 2022-09-26 — End: 2022-09-30

## 2022-09-26 MED ORDER — IOPAMIDOL 76 % IV SOLN
76 % | INTRAVENOUS | Status: AC
Start: 2022-09-26 — End: 2022-09-26
  Administered 2022-09-26: 16:00:00 100 via INTRAVENOUS

## 2022-09-26 MED ORDER — ALBUMIN HUMAN 5 % IV SOLN
5 % | Freq: Once | INTRAVENOUS | Status: AC
Start: 2022-09-26 — End: 2022-09-26
  Administered 2022-09-26: 17:00:00 25 g via INTRAVENOUS

## 2022-09-26 MED ORDER — DEXTROSE 5 % IV SOLN
5 % | INTRAVENOUS | Status: DC
Start: 2022-09-26 — End: 2022-09-27
  Administered 2022-09-26: 15:00:00 via INTRAVENOUS

## 2022-09-26 MED ORDER — PANTOPRAZOLE SODIUM 40 MG IV SOLR
40 MG | Freq: Two times a day (BID) | INTRAVENOUS | Status: DC
Start: 2022-09-26 — End: 2022-09-30
  Administered 2022-09-26 – 2022-09-30 (×9): 40 mg via INTRAVENOUS

## 2022-09-26 MED ORDER — GLUCOSE 4 G PO CHEW
4 g | ORAL | Status: AC | PRN
Start: 2022-09-26 — End: 2022-09-30

## 2022-09-26 MED ORDER — ALBUMIN HUMAN 25 % IV SOLN
25 % | Freq: Once | INTRAVENOUS | Status: DC
Start: 2022-09-26 — End: 2022-09-26

## 2022-09-26 MED ORDER — SODIUM CHLORIDE 0.9 % IV SOLN
0.9 % | INTRAVENOUS | Status: DC | PRN
Start: 2022-09-26 — End: 2022-09-27

## 2022-09-26 MED ORDER — GLUCAGON (RDNA) 1 MG IJ KIT
1 MG | INTRAMUSCULAR | Status: DC | PRN
Start: 2022-09-26 — End: 2022-09-30

## 2022-09-26 MED ORDER — DEXTROSE 10 % IV SOLN
10 % | INTRAVENOUS | Status: DC | PRN
Start: 2022-09-26 — End: 2022-09-30

## 2022-09-26 MED FILL — MIDODRINE HCL 5 MG PO TABS: 5 MG | ORAL | Qty: 2

## 2022-09-26 MED FILL — SEVELAMER CARBONATE 2.4 G PO PACK: 2.4 g | ORAL | Qty: 2.4

## 2022-09-26 MED FILL — ALBUTEIN 5 % IV SOLN: 5 % | INTRAVENOUS | Qty: 500

## 2022-09-26 MED FILL — LEVOFLOXACIN IN D5W 500 MG/100ML IV SOLN: 500 MG/100ML | INTRAVENOUS | Qty: 100

## 2022-09-26 MED FILL — ISOVUE-370 76 % IV SOLN: 76 % | INTRAVENOUS | Qty: 100

## 2022-09-26 MED FILL — PANTOPRAZOLE SODIUM 40 MG IV SOLR: 40 MG | INTRAVENOUS | Qty: 40

## 2022-09-26 MED FILL — DEXTROSE 5 % IV SOLN: 5 % | INTRAVENOUS | Qty: 1000

## 2022-09-26 MED FILL — METRONIDAZOLE 500 MG/100ML IV SOLN: 500 MG/100ML | INTRAVENOUS | Qty: 100

## 2022-09-26 MED FILL — ONDANSETRON HCL 4 MG/2ML IJ SOLN: 4 MG/2ML | INTRAMUSCULAR | Qty: 2

## 2022-09-26 MED FILL — ALBUTEIN 25 % IV SOLN: 25 % | INTRAVENOUS | Qty: 100

## 2022-09-26 MED FILL — MIRTAZAPINE 15 MG PO TABS: 15 MG | ORAL | Qty: 1

## 2022-09-26 MED FILL — DEXTROSE 10 % IV SOLN: 10 % | INTRAVENOUS | Qty: 1000

## 2022-09-26 MED FILL — ACETAMINOPHEN 325 MG PO TABS: 325 MG | ORAL | Qty: 2

## 2022-09-26 NOTE — Progress Notes (Signed)
Patient blood pressure dropped to 97/38 SpO2 was 86 and blood sugar was 76,hr was 66 after having a bloody diarrhea,patient put on oxygen 2 litre and SpO2 went up to 96,Dr Clarise Cruz informed about the patient and said we do H and H and blood drawn then we keep monitoring the patient.patient given apple sauce with added sugar and left comfortable.

## 2022-09-26 NOTE — Other (Cosign Needed)
Informed Consent for Blood Component Transfusion Note    I have discussed with the patient the rationale for blood component transfusion; its benefits in treating or preventing fatigue, organ damage, or death; and its risk which includes mild transfusion reactions, rare risk of blood borne infection, or more serious but rare reactions. I have discussed the alternatives to transfusion, including the risk and consequences of not receiving transfusion. The patient had an opportunity to ask questions and had agreed to proceed with transfusion of blood components.    Electronically signed by Hilbert Corrigan, APRN - NP on 09/26/22 at 9:50 AM EST

## 2022-09-26 NOTE — Progress Notes (Signed)
Rapid Response Note     09/26/22 at 0856    A rapid response was called on this patient for Hypotension    Response    Response team at the bedside for evaluation.    Hilbert Corrigan NP responding at the bedside.    Florentina Jenny RN is the primary nurse during this episode.    Ongoing vital signs monitored throughout critical time.    Situation    The patient was noted to have a low blood pressure with a GI bleed.  NP at the bedside ordering blood work, EKG, and Transfer to [place] level care.    0954- Transfer to IMCU 425 completed. Noureen RN at the bedside to accept the patient.    1007- CT department unable to complete the CT at this time due to nephrology/dialysis issues. Spoke with Dow Adolph NP about the situation. She will call the CT department and resolve the issue. Also reported result of the Hgb level. Orders are to give the blood transfusion due to the hypotension.    1017- Patient down to CT with nurse and monitor.    1045- Back from CT. Uneventful trip.    The patient was left in the care of his primary nursing team.        Sepsis Screening  Are two or more SIRS criteria present? No    If yes:     Is the patient's history suggestive of a new infection? No    If yes: Suspected source of infection:       Bedside EPOC Lab Values  Lactic level is 0.46 mmol/L

## 2022-09-26 NOTE — Progress Notes (Signed)
Occupational Therapy     Chart reviewed.  Noted patient transferred to higher level of care following rapid response de to hypotension (76/65) this morning.  Patient with 1 unit PRBC ordered as well as CTA due to moderate bloody stool during rapid response.  Will continue to follow once medically stable.  Thank you. Rosine Door OTR/L

## 2022-09-26 NOTE — Progress Notes (Signed)
Rapid response team called for low bp 76/65. HR 69-R-20. 02 sats 99% on 2l. N.P Petrie in to see patient. Rapid response team in. Labs, EKG and chest  Xray ordered. Patient remains alert and responding. Bloody stool noted x1 during rapid response.

## 2022-09-26 NOTE — Progress Notes (Cosign Needed)
RRT called this morning for hypotension with SBP 70s. Patient drowsy, but oriented X 4. Reports generalized malaise. Patient with BM during exam - noted to be moderate amount of bright red blood. CBC, CMP, lactic acid, CXR, and EKG ordered. 1 unit PRBC ordered. Transfer to intermediate care. Repeat BP 93/61, HR 69, RR 26, O2 sat 96% on 2 L NC, temp 97.    Spoke with Dr. Ferd Glassing - recommending CTA to examine for source of bleeding. Discussed with Dr. Griselda Miner in setting of known ESRD - per MD, okay for patient to receive contrast. Intensivist notified of patient in case of further decompensation.    Patient's wife, Lynnell Dike, updated on plan of care.     Critical Care Attestation:  This patient is unstable and critically ill. Due to a high probability of clinically significant, life threatening deterioration, the patient required my highest level of preparedness to intervene emergently and I personally spent this critical care time directly and personally managing the patient. This critical care time included obtaining a history; examining the patient; pulse oximetry; ordering and review of studies; arranging urgent treatment with development of a management plan; evaluation of patient's response to treatment; frequent reassessment; and, discussions with other providers and/or family. This critical care time was performed to assess and manage the high probability of imminent, life-threatening deterioration that could result in multi-organ failure and death. It was exclusive of separately billable procedures, treating other patients, and teaching time.      Time Spent:     I personally spent 30 minutes in providing critical care time.    Mike Gip, APRN - NP  09/26/2022  9:48 AM

## 2022-09-26 NOTE — Progress Notes (Cosign Needed)
[name]                                                                                          Hospitalist Progress Note  Mike Gip, APRN - NP  Answering service: [phone] OR [phone] from in house phone        Date of Service:  09/26/2022  NAME:  Jaime [name]  DOB:  11-02-1946  MRN:  [id number]       Admission Summary:   Per H&P: Jaime [name] is a 76 y.o. male with hx of dm ii, htn, esrd on hd, pvd s/p bilateral TMA, hx of shock 2/2 perforated viscus, liver abscess s/p ir drainage, sacral ulcer who presented to ed with complaints of rectal bleeding.  Patient states he had single episode of rectal bleeding which prompted him to seek ED care.  He denies any previous history of hemorrhoids.  Diverticulosis  The patient denies any fever, chills, chest or abdominal pain, nausea, vomiting, cough, congestion, recent illness, palpitations, or dysuria.     Remarkable vitals on ER Presentation: vss  Labs Remarkable for: wbc 17.6, alb 1.8, cr 2.23  ER Images: ct abd et pelvis:  Rectal wall thickening and mucosal enhancement suggests proctitis. Large  amount stool in the colon with no bowel obstruction.  3. No definite change in left hepatic fluid collection and adjacent ill-defined  stranding.  4. Heterogeneous low density in the pancreatic head and adjacent pancreatic  stranding may be unchanged to prior study performed without IV contrast.  Correlate for clinical signs of acute pancreatitis. Consider follow-up contrast  enhanced pancreas CT to document resolution..  5. Bibasilar atelectasis and pleural effusions  ER Rx: 1l ns bolus, levaquin and flagyl     Interval history / Subjective:      Patient seen and examined this morning during rapid response - see prior progress note from 0930 this morning.    Lactic 1.0. BP now improved.    Patient transferred to [facility]. CTA abd/pel showing no definite extravasation of contrast. Discussed with GI, patient likely to have  flexible sigmoidoscopy on Monday.  Assessment & Plan:     Acute proctitis  Constipation  Rectal Bleed  -abd/pel CT: rectal wall thickening and mucosal enhancement suggests proctitis, large amount stool in colon with no bowel obstruction  -lipase 14  -GI following: plans for probably flexible sigmoidoscopy on Monday  -RRT 11/25 for hypotension and bright red blood per rectum, Hgb 8.9 - 1 u PRBC ordered in setting of active bleeding  -abd/pel CTA (11/25): no definite extravasation of contrast  -monitor color and quantity of stools  -monitor Hgb, transfuse if <7 or active bleeding occurs  -start BID pantoprazole  -continue levofloxacin and metronidazole  -SBP 70s this morning, now 100s -- monitor vitals    ESRD on HD MWF  -Nephrology following  -continue home midodrine and renvela  -11/25: spoke with Dr. Griselda Miner, okay for patient to receive contrast     Hypoglycemia  -glucose 59 last night  -continue D5W  -continue glucose checks  -hypoglycemia protocol in place    Mood Disorder  -  continue home mirtazapine    PVD s/p toe amputation left foot  Chronic sacral wounds  Left heel ulcer  -wound care following     Code status: Full  Prophylaxis: SCDs  Care Plan discussed with: patient, RN, RRT RN, care manager  Anticipated Disposition: likely next week, probably flex sig on Monday  Inpatient  Cardiac monitoring: Remote Telemetry     Principal Problem:    Proctitis  Resolved Problems:    * No resolved hospital problems. *         Social Determinants of Health     Tobacco Use: Low Risk  (09/17/2022)    Patient History     Smoking Tobacco Use: Never     Smokeless Tobacco Use: Never     Passive Exposure: Not on file   Alcohol Use: Not on file   Financial Resource Strain: Not on file   Food Insecurity: Not on file   Transportation Needs: Not on file   Physical Activity: Not on file   Stress: Not on file   Social Connections: Not on file   Intimate Partner Violence: Not on file   Depression: Not on file   Housing Stability: Not  on file   Interpersonal Safety: Not on file   Utilities: Not on file       Review of Systems:   Feels fatigued      Vital Signs:    Last 24hrs VS reviewed since prior progress note. Most recent are:  Vitals:    09/26/22 0654   BP: (!) 98/51   Pulse: 67   Resp: 20   Temp: 98.2 F (36.8 C)   SpO2: 96%         Intake/Output Summary (Last 24 hours) at 09/26/2022 1610  Last data filed at 09/25/2022 2130  Gross per 24 hour   Intake 500 ml   Output 100 ml   Net 400 ml        Physical Examination:     I had a face to face encounter with this patient and independently examined them on 09/26/2022 as outlined below:          General : drowsy, oriented X4, awake, no acute distress  HEENT: PEERL, EOMI, dry mucus membrane  Neck: supple, no JVD  Chest: Clear to auscultation bilaterally, on 2 L NC  CVS: S1 S2 heard, Capillary refill less than 2 seconds  Abd: soft/non tender, non distended, hypoactive bowel sounds; bright red blood from rectum in brief  Ext: no clubbing, no cyanosis, 1+ lower extremity edema  Neuro/Psych: pleasant mood and affect, CN 2-12 grossly intact, sensory grossly within normal limit  Skin: warm, pale     Data Review:    Review and/or order of clinical lab test      I have personally and independently reviewed all pertinent labs, diagnostic studies, imaging, and have provided independent interpretation of the same.     Labs:     Recent Labs     September 29, 2022  1524   WBC 17.6*   HGB 11.3*   HCT 35.0*   PLT 106*       Recent Labs     09/29/2022  1524   NA 135*   K 3.9   CL 101   CO2 32   BUN 34*       Recent Labs     29-Sep-2022  1524   ALT 27   GLOB 4.0       No results for input(s): "  INR", "APTT" in the last 72 hours.    Invalid input(s): "PTP"   No results for input(s): "TIBC", "FERR" in the last 72 hours.    Invalid input(s): "FE", "PSAT"   No results found for: "FOL", "RBCF"   No results for input(s): "PH", "PCO2", "PO2" in the last 72 hours.  No results for input(s): "CPK" in the last 72 hours.    Invalid input(s):  "CPKMB", "CKNDX", "TROIQ"  No results found for: "CHOL", "CHOLX", "CHLST", "CHOLV", "HDL", "HDLC", "LDL", "LDLC", "TGLX", "TRIGL"  No results found for: "GLUCPOC"    Notes reviewed from all clinical/nonclinical/nursing services involved in patient's clinical care. Care coordination discussions were held with appropriate clinical/nonclinical/ nursing providers based on care coordination needs.       Patients current active Medications were reviewed, considered, added and adjusted based on the clinical condition today.      Home Medications were reconciled to the best of my ability given all available resources at the time of admission. Route is PO if not otherwise noted.    Medications Reviewed:     Current Facility-Administered Medications   Medication Dose Route Frequency    heparin (porcine) 1000 UNIT/ML injection 1,800 Units  1,800 Units IntraCATHeter PRN    And    heparin (porcine) 1000 UNIT/ML injection 1,800 Units  1,800 Units IntraCATHeter PRN    levoFLOXacin (LEVAQUIN) 500 MG/100ML infusion 500 mg  500 mg IntraVENous Q48H    albumin human 25% IV solution 25 g  25 g IntraVENous PRN    polyethylene glycol (GLYCOLAX) packet 17 g  17 g Oral Daily    midodrine (PROAMATINE) tablet 10 mg  10 mg Oral TID    mirtazapine (REMERON) tablet 7.5 mg  7.5 mg Oral Nightly    sevelamer (RENVELA) packet 2.4 g  2.4 g Oral TID WC    sodium chloride flush 0.9 % injection 5-40 mL  5-40 mL IntraVENous 2 times per day    sodium chloride flush 0.9 % injection 5-40 mL  5-40 mL IntraVENous PRN    0.9 % sodium chloride infusion   IntraVENous PRN    ondansetron (ZOFRAN-ODT) disintegrating tablet 4 mg  4 mg Oral Q8H PRN    Or    ondansetron (ZOFRAN) injection 4 mg  4 mg IntraVENous Q6H PRN    polyethylene glycol (GLYCOLAX) packet 17 g  17 g Oral Daily PRN    acetaminophen (TYLENOL) tablet 650 mg  650 mg Oral Q6H PRN    Or    acetaminophen (TYLENOL) suppository 650 mg  650 mg Rectal Q6H PRN    metronidazole (FLAGYL) 500 mg in 0.9% NaCl 100  mL IVPB premix  500 mg IntraVENous Q8H     ______________________________________________________________________  EXPECTED LENGTH OF STAY: Unable to retrieve estimated LOS  ACTUAL LENGTH OF STAY:          2                 Mike Gip, APRN - NP

## 2022-09-26 NOTE — Progress Notes (Signed)
PT Note:    Chart reviewed.  Noted patient transferred to higher level of care following rapid response de to hypotension (76/65) this morning.  Patient with 1 unit PRBC ordered as well as CTA due to moderate bloody stool during rapid response.  Will continue to follow once medically stable.  Thank you.

## 2022-09-26 NOTE — Progress Notes (Signed)
[facility]  [address]  [place], [place] [address]  [phone]                                                GI PROGRESS NOTE      NAME: Jaime [name]   DOB:  07/10/1946   MRN:  [id number]          Assessment:   Had some bleeding this morning with hypotension  Bleeding has stopped and BP ok - getting Midodrine and Albumin  Hgb fine(9.2) CTA today was negative for active bleed  Plan was to do elective flex sig sometime next week because of proctitis seen on scan    Plan:     No need for urgent colonoscopy now  Subjective:   Discussed with RN events overnight.    Complaint Y/N Description   Abdominal Pain     Hematemesis     Hematochezia     Melena     Constipation     Diarrhea     Dyspepsia     Dysphagia     Jaundiced         Review of Systems:    Symptom Y/N Comments  Symptom Y/N Comments   Fever/Chills    Chest Pain     Cough    Abdominal Pain     Sputum    Joint Pain     SOB/DOE    Pruritis/Rash     Nausea/vomit    Tolerating PT/OT     Diarrhea    Tolerating Diet     Constipation    Other       Could not obtain due to AMS vs intubation        Objective:     VITALS:   Last 24hrs VS reviewed since prior progress note. Most recent are:  Vitals:    09/26/22 1430   BP: (!) 110/52   Pulse: 66   Resp: 20   Temp: 97.9 F (36.6 C)   SpO2: 100%       Intake/Output Summary (Last 24 hours) at 09/26/2022 1603  Last data filed at 09/26/2022 1430  Gross per 24 hour   Intake 390.5 ml   Output 0 ml   Net 390.5 ml       PHYSICAL EXAM:  General: WD, WN. Alert, cooperative, no acute distress    HEENT: NC, Atraumatic.  PERRLA, EOMI. Anicteric sclerae.  Lungs:  CTA Bilaterally. No Wheezing/Rhonchi/Rales.  Heart:  Regular  rhythm,  No murmur (), No Rubs, No Gallops  Abdomen: Soft, Non distended, Non tender.  +Bowel sounds, no HSM  Extremities: No c/c/e  Neurologic:  CN 2-12 gi, Alert and oriented X 3.  No acute neurological distress   Psych:   Good insight. Not anxious nor agitated.    Lab Data Reviewed:  (see below)    Medications Reviewed: (see below)    PMH/SH reviewed - no change compared to H&P  ________________________________________________________________________  Total time spent with patient:     Critical Care Provided     Minutes non procedure based    Care Plan discussed with:  Patient    Family     RN    Care Manager    Consultant/Specialist:       >50% of visit spent in counseling and coordination of  care       Recommended Disposition:   Home with Family    HH/PT/OT/RN    SNF/LTC    SAHR      Code Status:  Full Code    DNR/DNI      ________________________________________________________________________  Gretel Acre, MD   ________________________________________________________________________________________________________________________________________________  Procedures: see electronic medical records for all procedures/Xrays and details which  were not copied into this note but were reviewed prior to creation of Plan.      LABS:  Recent Labs     09/26/22  0704 09/26/22  0920   WBC 21.4* 19.2*   HGB 9.2*  9.3* 8.9*   HCT 27.1*  27.0* 26.6*   PLT 84* 87*     Recent Labs     09-29-2022  1524 09/26/22  0920   NA 135* 137   K 3.9 3.6   CL 101 103   CO2 32 30   BUN 34* 27*     Recent Labs     Sep 29, 2022  1524 09/26/22  0920   GLOB 4.0 3.1     No results for input(s): "INR", "APTT" in the last 72 hours.    Invalid input(s): "PTP"   No results for input(s): "TIBC", "FERR" in the last 72 hours.    Invalid input(s): "FE", "PSAT"   No results found for: "FOL", "RBCF"  No results for input(s): "PH", "PCO2", "PO2" in the last 72 hours.  No results for input(s): "CPK", "CKMB", "TROPONINI" in the last 72 hours.  @labua @    MEDICATIONS:  Current Facility-Administered Medications   Medication Dose Route Frequency    0.9 % sodium chloride infusion   IntraVENous PRN    dextrose 5 % solution   IntraVENous Continuous    pantoprazole (PROTONIX) 40 mg in sodium chloride (PF) 0.9 % 10 mL injection  40 mg  IntraVENous Q12H    glucose chewable tablet 16 g  4 tablet Oral PRN    dextrose bolus 10% 125 mL  125 mL IntraVENous PRN    Or    dextrose bolus 10% 250 mL  250 mL IntraVENous PRN    glucagon injection 1 mg  1 mg SubCUTAneous PRN    dextrose 10 % infusion   IntraVENous Continuous PRN    heparin (porcine) 1000 UNIT/ML injection 1,800 Units  1,800 Units IntraCATHeter PRN    And    heparin (porcine) 1000 UNIT/ML injection 1,800 Units  1,800 Units IntraCATHeter PRN    levoFLOXacin (LEVAQUIN) 500 MG/100ML infusion 500 mg  500 mg IntraVENous Q48H    albumin human 25% IV solution 25 g  25 g IntraVENous PRN    polyethylene glycol (GLYCOLAX) packet 17 g  17 g Oral Daily    midodrine (PROAMATINE) tablet 10 mg  10 mg Oral TID    mirtazapine (REMERON) tablet 7.5 mg  7.5 mg Oral Nightly    sevelamer (RENVELA) packet 2.4 g  2.4 g Oral TID WC    sodium chloride flush 0.9 % injection 5-40 mL  5-40 mL IntraVENous 2 times per day    sodium chloride flush 0.9 % injection 5-40 mL  5-40 mL IntraVENous PRN    0.9 % sodium chloride infusion   IntraVENous PRN    ondansetron (ZOFRAN-ODT) disintegrating tablet 4 mg  4 mg Oral Q8H PRN    Or    ondansetron (ZOFRAN) injection 4 mg  4 mg IntraVENous Q6H PRN    polyethylene glycol (GLYCOLAX) packet 17 g  17 g Oral Daily  PRN    acetaminophen (TYLENOL) tablet 650 mg  650 mg Oral Q6H PRN    Or    acetaminophen (TYLENOL) suppository 650 mg  650 mg Rectal Q6H PRN    metronidazole (FLAGYL) 500 mg in 0.9% NaCl 100 mL IVPB premix  500 mg IntraVENous Q8H

## 2022-09-26 NOTE — Progress Notes (Signed)
Transferred to room 425/IMCU by critical care nurse.

## 2022-09-26 NOTE — Progress Notes (Signed)
[facility]   [address], [address] [place], [place] [address]  Phone: [phone]   [phone]    [email or web address]     Nephrology Progress Note    Patient Name : Jaime [name]     DOB : Nov 12, 1945     MRN : [id number]  Date of Admission : 09/07/2022  Date of Servive : 09/26/22    CC:  Follow up for ESRD       Assessment and Plan   ESRD-HD  -Dialyzes MWF at [facility]  -Access: [facility]: Placed 07/01/2022  -Next HD on Monday.  -Watch for renal recovery    Acute proctitis:  Rectal bleed  -Per primary team    Hypoglycemia:  -Started D5W at 50 cc/h    Anemia in CKD:  -Continue ESA    Chronic hypotension:  -Continue midodrine    PVD: S/p B/L TMA  DM2  Hx of pancreatitis  Hx of liver abscess s/p drainage  Sacral ulcer     Interval History:  Patient seen and examined earlier this morning along with nursing staff.  Patient had further rectal bleeding overnight and has progressively become more hypotensive.  Lethargic and poor p.o. intake.  Hypoglycemic overnight.    Review of Systems: A comprehensive review of systems was negative.    Current Medications:   Current Facility-Administered Medications   Medication Dose Route Frequency    0.9 % sodium chloride infusion   IntraVENous PRN    heparin (porcine) 1000 UNIT/ML injection 1,800 Units  1,800 Units IntraCATHeter PRN    And    heparin (porcine) 1000 UNIT/ML injection 1,800 Units  1,800 Units IntraCATHeter PRN    levoFLOXacin (LEVAQUIN) 500 MG/100ML infusion 500 mg  500 mg IntraVENous Q48H    albumin human 25% IV solution 25 g  25 g IntraVENous PRN    polyethylene glycol (GLYCOLAX) packet 17 g  17 g Oral Daily    midodrine (PROAMATINE) tablet 10 mg  10 mg Oral TID    mirtazapine (REMERON) tablet 7.5 mg  7.5 mg Oral Nightly    sevelamer (RENVELA) packet 2.4 g  2.4 g Oral TID WC    sodium chloride flush 0.9 % injection 5-40 mL  5-40 mL IntraVENous 2 times per day    sodium chloride flush 0.9 % injection 5-40 mL  5-40 mL  IntraVENous PRN    0.9 % sodium chloride infusion   IntraVENous PRN    ondansetron (ZOFRAN-ODT) disintegrating tablet 4 mg  4 mg Oral Q8H PRN    Or    ondansetron (ZOFRAN) injection 4 mg  4 mg IntraVENous Q6H PRN    polyethylene glycol (GLYCOLAX) packet 17 g  17 g Oral Daily PRN    acetaminophen (TYLENOL) tablet 650 mg  650 mg Oral Q6H PRN    Or    acetaminophen (TYLENOL) suppository 650 mg  650 mg Rectal Q6H PRN    metronidazole (FLAGYL) 500 mg in 0.9% NaCl 100 mL IVPB premix  500 mg IntraVENous Q8H      Allergies   Allergen Reactions    Augmentin [Amoxicillin-Pot Clavulanate] Hives     Tolerated ceftriaxone 06/2022    Codeine Itching     Unknown reaction    Oxycodone Itching       Objective:  Vitals:    Vitals:    09/26/22 0355 09/26/22 0600 09/26/22 0645 09/26/22 0654   BP:   (!) 96/34 (!) 98/51   Pulse: 65  66 66 67   Resp:   20 20   Temp:    98.2 F (36.8 C)   TempSrc:       SpO2:   (!) 86% 96%   Weight:       Height:         Intake and Output:  No intake/output data recorded.  11/23 1901 - 11/25 0700  In: 500 [P.O.:100]  Out: 100     Physical Examination:  General: ill looking  Neck:  Supple, no mass  Resp:  Lungs CTA B/L, no wheezing , normal respiratory effort  CV:  RRR,  no murmur or rub, LE edema  GI:  Soft, NT, + BS, no HS megaly  Neurologic:  Non focal    Dialysis Access : [facility]    []     High complexity decision making was performed  []     Patient is at high-risk of decompensation with multiple organ involvement    Lab Data Personally Reviewed: I have reviewed all the pertinent labs, microbiology data and radiology studies during assessment.    Labs:  Recent Labs     09/17/2022  1524   NA 135*   K 3.9   CL 101   CO2 32   GLUCOSE 81   BUN 34*   CREATININE 2.23*   CALCIUM 8.9         Recent Labs     09/21/2022  1524 09/26/22  0704   WBC 17.6*  --    RBC 3.91*  --    HGB 11.3* 9.3*   HCT 35.0* 27.0*   MCV 89.5  --    MCH 28.9  --    MCHC 32.3  --    RDW 19.6*  --    PLT 106*  --    MPV 10.9  --        Recent  Labs     09/06/2022  1524   GLOB 4.0       No results for input(s): "INR", "APTT" in the last 72 hours.    Invalid input(s): "PTP"   No results for input(s): "CPK", "CKMB", "TROPONINI" in the last 72 hours.    Invalid input(s): "B-NP"  Invalid input(s): "PHI", "PCO2I", "PO2I", "FIO2I"     Ventilator:       Microbiology:  No results found for: "SDES"  No components found for: "CULT"      I have reviewed the flowsheets.  Chart and Pertinent Notes have been reviewed.   No change in PMH ,family and social history from Consult note.      Sunday Shams, MD  [facility]

## 2022-09-27 LAB — BASIC METABOLIC PANEL
Anion Gap: 4 mmol/L — ABNORMAL LOW (ref 5–15)
BUN/Creatinine Ratio: 13 (ref 12–20)
BUN: 27 MG/DL — ABNORMAL HIGH (ref 6–20)
CO2: 27 mmol/L (ref 21–32)
Calcium: 8.3 MG/DL — ABNORMAL LOW (ref 8.5–10.1)
Chloride: 103 mmol/L (ref 97–108)
Creatinine: 2.09 MG/DL — ABNORMAL HIGH (ref 0.70–1.30)
Est, Glom Filt Rate: 32 mL/min/{1.73_m2} — ABNORMAL LOW (ref >60)
Glucose: 96 mg/dL (ref 65–100)
Potassium: 4.4 mmol/L (ref 3.5–5.1)
Sodium: 134 mmol/L — ABNORMAL LOW (ref 136–145)

## 2022-09-27 LAB — POCT GLUCOSE
POC Glucose: 122 mg/dL — ABNORMAL HIGH (ref 65–117)
POC Glucose: 67 mg/dL (ref 65–117)
POC Glucose: 74 mg/dL (ref 65–117)
POC Glucose: 90 mg/dL (ref 65–117)
POC Glucose: 94 mg/dL (ref 65–117)

## 2022-09-27 LAB — CBC WITH AUTO DIFFERENTIAL
Basophils %: 0 % (ref 0–1)
Basophils Absolute: 0 10*3/uL (ref 0.0–0.1)
Eosinophils %: 0 % (ref 0–7)
Eosinophils Absolute: 0 10*3/uL (ref 0.0–0.4)
Hematocrit: 30.6 % — ABNORMAL LOW (ref 36.6–50.3)
Hemoglobin: 10.3 g/dL — ABNORMAL LOW (ref 12.1–17.0)
Immature Granulocytes %: 1 % — ABNORMAL HIGH (ref 0.0–0.5)
Immature Granulocytes Absolute: 0.2 10*3/uL — ABNORMAL HIGH (ref 0.00–0.04)
Lymphocytes %: 13 % (ref 12–49)
Lymphocytes Absolute: 2.2 10*3/uL (ref 0.8–3.5)
MCH: 29.1 PG (ref 26.0–34.0)
MCHC: 33.7 g/dL (ref 30.0–36.5)
MCV: 86.4 FL (ref 80.0–99.0)
MPV: 11.5 FL (ref 8.9–12.9)
Monocytes %: 5 % (ref 5–13)
Monocytes Absolute: 0.8 10*3/uL (ref 0.0–1.0)
Neutrophils %: 81 % — ABNORMAL HIGH (ref 32–75)
Neutrophils Absolute: 13.5 10*3/uL — ABNORMAL HIGH (ref 1.8–8.0)
Nucleated RBCs: 0.2 PER 100 WBC — ABNORMAL HIGH
Platelets: 92 10*3/uL — ABNORMAL LOW (ref 150–400)
RBC: 3.54 M/uL — ABNORMAL LOW (ref 4.10–5.70)
RDW: 19.2 % — ABNORMAL HIGH (ref 11.5–14.5)
WBC: 16.7 10*3/uL — ABNORMAL HIGH (ref 4.1–11.1)
nRBC: 0.03 10*3/uL — ABNORMAL HIGH (ref 0.00–0.01)

## 2022-09-27 LAB — HEPATIC FUNCTION PANEL
ALT: 15 U/L (ref 12–78)
AST: 40 U/L — ABNORMAL HIGH (ref 15–37)
Albumin/Globulin Ratio: 0.6 — ABNORMAL LOW (ref 1.1–2.2)
Albumin: 1.8 g/dL — ABNORMAL LOW (ref 3.5–5.0)
Alk Phosphatase: 105 U/L (ref 45–117)
Bilirubin, Direct: 0.3 MG/DL — ABNORMAL HIGH (ref 0.0–0.2)
Globulin: 3.1 g/dL (ref 2.0–4.0)
Total Bilirubin: 1.4 MG/DL — ABNORMAL HIGH (ref 0.2–1.0)
Total Protein: 4.9 g/dL — ABNORMAL LOW (ref 6.4–8.2)

## 2022-09-27 LAB — LACTIC ACID: Lactic Acid, Plasma: 1.4 MMOL/L (ref 0.4–2.0)

## 2022-09-27 LAB — PROTIME-INR
INR: 1.9 — ABNORMAL HIGH (ref 0.9–1.1)
Protime: 19 s — ABNORMAL HIGH (ref 9.0–11.1)

## 2022-09-27 MED ORDER — DEXTROSE 10 % IV SOLN
10 % | INTRAVENOUS | Status: DC
Start: 2022-09-27 — End: 2022-09-30
  Administered 2022-09-27: 21:00:00 via INTRAVENOUS

## 2022-09-27 MED ORDER — MIDODRINE HCL 5 MG PO TABS
5 MG | Freq: Three times a day (TID) | ORAL | Status: DC
Start: 2022-09-27 — End: 2022-09-27
  Administered 2022-09-27 (×2): 10 mg via ORAL

## 2022-09-27 MED ORDER — ALBUMIN HUMAN 25 % IV SOLN
25 % | Freq: Four times a day (QID) | INTRAVENOUS | Status: DC
Start: 2022-09-27 — End: 2022-09-27

## 2022-09-27 MED ORDER — METRONIDAZOLE 250 MG PO TABS
250 MG | Freq: Three times a day (TID) | ORAL | Status: DC
Start: 2022-09-27 — End: 2022-09-30
  Administered 2022-09-27 – 2022-09-30 (×10): 500 mg via ORAL

## 2022-09-27 MED ORDER — VANCOMYCIN INTERMITTENT DOSING (PLACEHOLDER)
INTRAVENOUS | Status: DC
Start: 2022-09-27 — End: 2022-09-30

## 2022-09-27 MED ORDER — MIDODRINE HCL 5 MG PO TABS
5 MG | Freq: Three times a day (TID) | ORAL | Status: DC
Start: 2022-09-27 — End: 2022-09-30
  Administered 2022-09-28 – 2022-09-30 (×9): 15 mg via ORAL

## 2022-09-27 MED ORDER — VANCOMYCIN 2000 MG IN NS 500 ML (PREMIX) IVPB
Freq: Once | Status: AC
Start: 2022-09-27 — End: 2022-09-27
  Administered 2022-09-27: 20:00:00 2000 mg via INTRAVENOUS

## 2022-09-27 MED ORDER — ALBUMIN HUMAN 5 % IV SOLN
5 % | Freq: Once | INTRAVENOUS | Status: AC
Start: 2022-09-27 — End: 2022-09-27
  Administered 2022-09-27: 12:00:00 25 g via INTRAVENOUS

## 2022-09-27 MED ORDER — LACTATED RINGERS IV BOLUS
Freq: Once | INTRAVENOUS | Status: AC
Start: 2022-09-27 — End: 2022-09-27
  Administered 2022-09-27: 11:00:00 500 mL via INTRAVENOUS

## 2022-09-27 MED FILL — SEVELAMER CARBONATE 2.4 G PO PACK: 2.4 g | ORAL | Qty: 2.4

## 2022-09-27 MED FILL — DEXTROSE 10 % IV SOLN: 10 % | INTRAVENOUS | Qty: 1000

## 2022-09-27 MED FILL — PANTOPRAZOLE SODIUM 40 MG IV SOLR: 40 MG | INTRAVENOUS | Qty: 40

## 2022-09-27 MED FILL — METRONIDAZOLE 250 MG PO TABS: 250 MG | ORAL | Qty: 2

## 2022-09-27 MED FILL — MIDODRINE HCL 5 MG PO TABS: 5 MG | ORAL | Qty: 2

## 2022-09-27 MED FILL — VANCOMYCIN HCL 10 G IV SOLR: 10 g | INTRAVENOUS | Qty: 2000

## 2022-09-27 MED FILL — LACTATED RINGERS IV SOLN: INTRAVENOUS | Qty: 500

## 2022-09-27 MED FILL — MIRTAZAPINE 15 MG PO TABS: 15 MG | ORAL | Qty: 1

## 2022-09-27 MED FILL — METRONIDAZOLE 500 MG/100ML IV SOLN: 500 MG/100ML | INTRAVENOUS | Qty: 100

## 2022-09-27 MED FILL — ALBUTEIN 5 % IV SOLN: 5 % | INTRAVENOUS | Qty: 500

## 2022-09-27 NOTE — Consults (Signed)
[facility]    ICU TEAM Progress Note    Name: Jaime [name]   DOB: May 08, 1946   MRN: [id number]   Date: 09/27/2022      Assessment and Plan:     Briefly, 76 y.o. male with hx of dm ii, htn, esrd on hd, pvd s/p bilateral TMA, hx of shock 2/2 perforated viscus, liver abscess s/p ir drainage, sacral ulcer who presented to ed with complaints of rectal bleeding.      Currently being treated for proctitis.  Has had mild hypotension since admission on 11/23.      ICU Problems:  Hypotension:    Agree with holding fluids for now with worsening resp status.    Consider broadening abx to cover developing PNA  No clear evidence for this w bibasilar consolidations  Leukocytosis is decreasing  However is at risk of developing PNA  Agree with midodrine.  Has only had 1 dose.  On my exam MAP appropriate.  Has had mild low MAPs since admission.  Reasonable to continue midodrine and monitor  There is concern she may not tolerate IHD.  TBD.  May need extra PRN dose for IHD.  Defer to nephrology.    No ICU needs at this time.  Pt appear chronically ill vs acutely ill.  High likelihood of decompensation at some point given chronically ill bed bound state w decubitus ulcer, poor respiratory mechanics, and PVD. Recommend further GOC conversations and consider palliative consult.    I            POD:  * No surgery found *    S/P:       Active Problem List:     @PROBWOV @    Past Medical History:      has a past medical history of Diabetes mellitus (HCC), Hemodialysis patient (HCC), and Hypertension.    Past Surgical History:      has a past surgical history that includes laparotomy (N/A, 06/01/2022); laparoscopy (N/A, 06/01/2022); Bladder surgery (N/A, 06/01/2022); CT DRAINAGE VISCERAL PERCUTANEOUS (06/05/2022); IR TUNNELED CVC PLACE WO SQ PORT/PUMP > 5 YEARS (07/01/2022); and Foot Debridement (Bilateral, 07/23/2022).    Home Medications:     Prior to Admission medications    Medication Sig Start Date End Date Taking? Authorizing  Provider   traMADol (ULTRAM) 50 MG tablet Take 1 tablet by mouth every 6 hours as needed for Pain. Max Daily Amount: 200 mg    [provider]   acetaminophen (TYLENOL) 325 MG tablet Take 2 tablets by mouth every 6 hours as needed for Pain  Patient not taking: Reported on 09/01/2022    [provider]   zinc sulfate (ZINCATE) 220 (50 Zn) MG capsule Take 1 capsule by mouth daily    [provider]   amiodarone (CORDARONE) 200 MG tablet 1 tablet by Per NG tube route daily 08/04/22   10/24/2022 B, MD   midodrine (PROAMATINE) 5 MG tablet Take 1 tablet by mouth daily as needed (dialysis)  Patient not taking: Reported on 09/01/2022 08/03/22   10/03/22, MD   mirtazapine (REMERON) 7.5 MG tablet Take 1 tablet by mouth nightly 08/03/22   10/03/22 B, MD   sevelamer (RENVELA) 2.4 g PACK packet Take 2.4 g by mouth 3 times daily (with meals) 08/03/22   10/03/22, MD       Allergies/Social/Family History:     Allergies   Allergen Reactions    Augmentin [Amoxicillin-Pot Clavulanate] Hives  Tolerated ceftriaxone 06/2022    Codeine Itching     Unknown reaction    Oxycodone Itching      Social History     Tobacco Use    Smoking status: Never    Smokeless tobacco: Never   Substance Use Topics    Alcohol use: Not Currently      No family history on file.      Objective:   Vital Signs:  BP (!) 90/49   Pulse 73   Temp 97.3 F (36.3 C) (Axillary)   Resp 19   Ht 1.727 m (5\' 8" )   Wt 78.8 kg (173 lb 11.6 oz)   SpO2 97%   BMI 26.41 kg/m      Temp (24hrs), Avg:97.8 F (36.6 C), Min:97.3 F (36.3 C), Max:98.2 F (36.8 C)           Intake/Output:     Intake/Output Summary (Last 24 hours) at 09/27/2022 1251  Last data filed at 09/26/2022 2030  Gross per 24 hour   Intake 790.5 ml   Output --   Net 790.5 ml       Physical Exam:    GEN:  Chronically ill appearing  CV RRR  Resp Shallow  Abd soft  Extremitie 2+ edema, cool    LABS AND  DATA: Personally reviewed  Recent Labs      09/26/22  0920 09/26/22  1731 09/27/22  0315   WBC 19.2*  --  16.7*   HGB 8.9* 10.3* 10.3*   HCT 26.6* 30.7* 30.6*   PLT 87*  --  92*     Recent Labs     09/26/22  0920 09/27/22  0315   NA 137 134*   K 3.6 4.4   CL 103 103   CO2 30 27   BUN 27* 27*     Recent Labs     09/26/22  0920 09/27/22  0315   GLOB 3.1 3.1     No results for input(s): "INR", "APTT" in the last 72 hours.    Invalid input(s): "PTP"   Invalid input(s): "PHI", "PCO2I", "PO2I", "FIO2I"  No results for input(s): "CPK", "CKMB" in the last 72 hours.    Invalid input(s): "TROIQ", "BNPP"    Hemodynamics:   PAP:   CO:     Wedge:   CI:     CVP:    SVR:       PVR:       Ventilator Settings:  Mode Rate Tidal Volume Pressure FiO2 PEEP                    Peak airway pressure:      Minute ventilation:          MEDS: Reviewed    Imaging reviewed    CRITICAL CARE CONSULTANT NOTE  I had a face to face encounter with the patient, reviewed and interpreted patient data including clinical events, labs, images, vital signs, I/O's, and examined patient.  I have discussed the case and the plan and management of the patient's care with the consulting services, the bedside nurses and the respiratory therapist.      NOTE OF PERSONAL INVOLVEMENT IN CARE   This patient has a high probability of imminent, clinically significant deterioration, which requires the highest level of preparedness to intervene urgently. I participated in the decision-making and personally managed or directed the management of the following life and organ supporting interventions that required my frequent assessment to treat or  prevent imminent deterioration.    I personally spent -- minutes of critical care time.  This is time spent at this critically ill patient's bedside actively involved in patient care as well as the coordination of care and discussions with the patient's family.  This does not include any procedural time which has been billed separately.      [facility]

## 2022-09-27 NOTE — Progress Notes (Signed)
[facility]  [address]  [place], [place] [address]  [phone]                                                GI PROGRESS NOTE      NAME: Jaime [name]   DOB:  1946/04/15   MRN:  [id number]          Assessment:   Had some bleeding ywaterday with hypotension which led to consideration of moving to unit.   Critical care team saw and said this  "Briefly, 76 y.o. male with hx of dm ii, htn, esrd on hd, pvd s/p bilateral TMA, hx of shock 2/2 perforated viscus, liver abscess s/p ir drainage, sacral ulcer who presented to ed with complaints of rectal bleeding.    Currently being treated for proctitis.  Has had mild hypotension since admission on 11/23.   ICU Problems:  Hypotension:    Agree with holding fluids for now with worsening resp status.    Consider broadening abx to cover developing PNA  No clear evidence for this w bibasilar consolidations  Leukocytosis is decreasing  However is at risk of developing PNA  Agree with midodrine.  Has only had 1 dose.  On my exam MAP appropriate.  Has had mild low MAPs since admission.  Reasonable to continue midodrine and monitor  There is concern she may not tolerate IHD.  TBD.  May need extra PRN dose for IHD.  Defer to nephrology.   No ICU needs at this time.  Pt appear chronically ill vs acutely ill.  High likelihood of decompensation at some point given chronically ill bed bound state w decubitus ulcer, poor respiratory mechanics, and PVD. Recommend further GOC conversations and consider palliative consult.   Briefly, 76 y.o. male with hx of dm ii, htn, esrd on hd, pvd s/p bilateral TMA, hx of shock 2/2 perforated viscus, liver abscess s/p ir drainage, sacral ulcer who presented to ed with complaints of rectal bleeding.       Currently being treated for proctitis.  Has had mild hypotension since admission on 11/23.    ICU Problems:  Hypotension:    Agree with holding fluids for now with worsening resp status.    Consider broadening abx to cover  developing PNA  No clear evidence for this w bibasilar consolidations  Leukocytosis is decreasing  However is at risk of developing PNA  Agree with midodrine.  Has only had 1 dose.  On my exam MAP appropriate.  Has had mild low MAPs since admission.  Reasonable to continue midodrine and monitor  There is concern she may not tolerate IHD.  TBD.  May need extra PRN dose for IHD.  Defer to nephrology.   No ICU needs at this time.  Pt appear chronically ill vs acutely ill.  High likelihood of decompensation at some point given chronically ill bed bound state w decubitus ulcer, poor respiratory mechanics, and PVD. Recommend further GOC conversations and consider palliative consult."      Bleeding has stopped and BP ok - getting Midodrine and Albumin"  Hgb 8.9  CTA yesterday was negative for active bleed  Plan was to do elective flex sig sometime next week because of proctitis seen on scan    Plan:     No need for urgent colonoscopy now, In fact  would try to avoid this    Subjective:   Discussed with RN events overnight.    Complaint Y/N Description   Abdominal Pain     Hematemesis     Hematochezia     Melena     Constipation     Diarrhea     Dyspepsia     Dysphagia     Jaundiced         Review of Systems:    Symptom Y/N Comments  Symptom Y/N Comments   Fever/Chills    Chest Pain     Cough    Abdominal Pain     Sputum    Joint Pain     SOB/DOE    Pruritis/Rash     Nausea/vomit    Tolerating PT/OT     Diarrhea    Tolerating Diet     Constipation    Other       Could not obtain due to AMS vs intubation        Objective:     VITALS:   Last 24hrs VS reviewed since prior progress note. Most recent are:  Vitals:    09/27/22 1400   BP:    Pulse: 75   Resp:    Temp:    SpO2:        Intake/Output Summary (Last 24 hours) at 09/27/2022 1408  Last data filed at 09/26/2022 2030  Gross per 24 hour   Intake 790.5 ml   Output --   Net 790.5 ml         PHYSICAL EXAM:  General: WD, WN. Alert, cooperative, no acute distress    HEENT: NC,  Atraumatic.  PERRLA, EOMI. Anicteric sclerae.  Lungs:  CTA Bilaterally. No Wheezing/Rhonchi/Rales.  Heart:  Regular  rhythm,  No murmur (), No Rubs, No Gallops  Abdomen: Soft, Non distended, Non tender.  +Bowel sounds, no HSM  Extremities: No c/c/e  Neurologic:  CN 2-12 gi, Alert and oriented X 3.  No acute neurological distress   Psych:   Good insight. Not anxious nor agitated.    Lab Data Reviewed: (see below)    Medications Reviewed: (see below)    PMH/SH reviewed - no change compared to H&P  ________________________________________________________________________  Total time spent with patient:     Critical Care Provided     Minutes non procedure based    Care Plan discussed with:  Patient    Family     RN    Care Manager    Consultant/Specialist:       >50% of visit spent in counseling and coordination of care       Recommended Disposition:   Home with Family    HH/PT/OT/RN    SNF/LTC    SAHR      Code Status:  Full Code    DNR/DNI      ________________________________________________________________________  Gretel Acre, MD   ________________________________________________________________________________________________________________________________________________  Procedures: see electronic medical records for all procedures/Xrays and details which  were not copied into this note but were reviewed prior to creation of Plan.      LABS:  Recent Labs     09/26/22  0920 09/26/22  1731 09/27/22  0315   WBC 19.2*  --  16.7*   HGB 8.9* 10.3* 10.3*   HCT 26.6* 30.7* 30.6*   PLT 87*  --  92*       Recent Labs     09/27/2022  1524 09/26/22  0920 09/27/22  0315  NA 135* 137 134*   K 3.9 3.6 4.4   CL 101 103 103   CO2 32 30 27   BUN 34* 27* 27*       Recent Labs     09/11/2022  1524 09/26/22  0920 09/27/22  0315   GLOB 4.0 3.1 3.1       No results for input(s): "INR", "APTT" in the last 72 hours.    Invalid input(s): "PTP"   No results for input(s): "TIBC", "FERR" in the last 72 hours.    Invalid input(s):  "FE", "PSAT"   No results found for: "FOL", "RBCF"  No results for input(s): "PH", "PCO2", "PO2" in the last 72 hours.  No results for input(s): "CPK", "CKMB", "TROPONINI" in the last 72 hours.  @labua @    MEDICATIONS:  Current Facility-Administered Medications   Medication Dose Route Frequency    midodrine (PROAMATINE) tablet 10 mg  10 mg Oral TID    0.9 % sodium chloride infusion   IntraVENous PRN    dextrose 5 % solution   IntraVENous Continuous    pantoprazole (PROTONIX) 40 mg in sodium chloride (PF) 0.9 % 10 mL injection  40 mg IntraVENous Q12H    glucose chewable tablet 16 g  4 tablet Oral PRN    dextrose bolus 10% 125 mL  125 mL IntraVENous PRN    Or    dextrose bolus 10% 250 mL  250 mL IntraVENous PRN    glucagon injection 1 mg  1 mg SubCUTAneous PRN    dextrose 10 % infusion   IntraVENous Continuous PRN    heparin (porcine) 1000 UNIT/ML injection 1,800 Units  1,800 Units IntraCATHeter PRN    And    heparin (porcine) 1000 UNIT/ML injection 1,800 Units  1,800 Units IntraCATHeter PRN    levoFLOXacin (LEVAQUIN) 500 MG/100ML infusion 500 mg  500 mg IntraVENous Q48H    albumin human 25% IV solution 25 g  25 g IntraVENous PRN    polyethylene glycol (GLYCOLAX) packet 17 g  17 g Oral Daily    mirtazapine (REMERON) tablet 7.5 mg  7.5 mg Oral Nightly    sevelamer (RENVELA) packet 2.4 g  2.4 g Oral TID WC    sodium chloride flush 0.9 % injection 5-40 mL  5-40 mL IntraVENous 2 times per day    sodium chloride flush 0.9 % injection 5-40 mL  5-40 mL IntraVENous PRN    0.9 % sodium chloride infusion   IntraVENous PRN    ondansetron (ZOFRAN-ODT) disintegrating tablet 4 mg  4 mg Oral Q8H PRN    Or    ondansetron (ZOFRAN) injection 4 mg  4 mg IntraVENous Q6H PRN    polyethylene glycol (GLYCOLAX) packet 17 g  17 g Oral Daily PRN    acetaminophen (TYLENOL) tablet 650 mg  650 mg Oral Q6H PRN    Or    acetaminophen (TYLENOL) suppository 650 mg  650 mg Rectal Q6H PRN    metronidazole (FLAGYL) 500 mg in 0.9% NaCl 100 mL IVPB premix   500 mg IntraVENous Q8H

## 2022-09-27 NOTE — Plan of Care (Signed)
Problem: Occupational Therapy - Adult  Goal: By Discharge: Performs self-care activities at highest level of function for planned discharge setting.  See evaluation for individualized goals.  Description: FUNCTIONAL STATUS PRIOR TO ADMISSION:  Patient was independent prior to the end of July when he was admitted to [facility] with a perforated bowel. Had two month admission with multiple complications including sacral wounds and bilateral transmetatarsal amputations, + new HD dependency. Discharged to [facility] for rehab where he has been for two months. States he is hoyer lifted to w/c but is not yet standing or ambulating. Of note, he was heel WB bilaterally for transfers only during his [facility] admission (unclear if his precautions remain at this time).     HOME SUPPORT PRIOR TO ADMISSION: The patient lived with his wife in [place], [place] prior to admission to [facility] (was in town for the [place]). Residing at [facility] prior to this admission    Receives Help From: Other (comment) (staff at rehab facility), ADL Assistance: Needs assistance,  ,  ,  ,  ,  ,  , Ambulation Assistance: Non-ambulatory, Transfer Assistance: Needs assistance (hoyert lift at rehab),       Occupational Therapy Goals:  Initiated 09/27/2022  1.  Patient will perform grooming with Moderate Assist within 7 day(s).  2.  Patient will perform upper body dressing with Moderate Assist within 7 day(s).  3.  Patient will perform self-feeding with Stand by Assist within 7 day(s).  4.  Patient will perform roll from L<>R with mod A x 2 within 7 day(s).  5.  Patient will participate in upper extremity therapeutic exercise/activities with Moderate Assist for 10 minutes within 7 day(s).    6.  Patient will utilize energy conservation techniques during functional activities with verbal and visual cues within 7 day(s).    09/27/2022 1425 by [name], OT  Outcome: Progressing      OCCUPATIONAL THERAPY EVALUATION    Patient: Jaime [name] (76 y.o.  male)  Date: 09/27/2022  Primary Diagnosis: Hemorrhage of rectum and anus [K62.5]  Proctitis [K62.89]  Skin ulcer of sacrum, unspecified ulcer stage ([place]) [L98.429]         Precautions:  (sacral wounds)                  ASSESSMENT :  The patient is limited by decreased functional mobility, independence in ADLs, high-level IADLs, ROM, strength, sensation, body mechanics, activity tolerance, endurance, safety awareness, coordination, balance, proprioception, posture, fine-motor control, increased pain levels. Greeted resting in bed agreeable to OT. Performed bed mobility with max A x 2 for rolling L<>R. Pt soiled and required total A for peri-area hygiene. Pt very weak and presents with congested cough. Of note, BP with low readings directly after rolling for hygiene but then recovered. B hands edematous. Recommend return to SNF at d/c OT to continue to follow.  Functional Outcome Measure:  The patient scored 7/24 on the AMPAC        PLAN :  Recommendations and Planned Interventions:   self care training, therapeutic activities, functional mobility training, balance training, therapeutic exercise, endurance activities, patient education, home safety training, and family training/education    Frequency/Duration: OT Plan of Care: 5 times/week    Recommendation for discharge: (in order for the patient to meet his/her long term goals): Therapy up to 5 days/week in Skilled nursing facility    Other factors to consider for discharge: not safe to be alone    IF patient discharges home will need  the following DME: continuing to assess with progress       SUBJECTIVE:   Patient stated, "My throat hurts."  OBJECTIVE DATA SUMMARY:     Past Medical History:   Diagnosis Date    Diabetes mellitus (HCC)     Hemodialysis patient (HCC) 07/12/2022    Hypertension      Past Surgical History:   Procedure Laterality Date    BLADDER SURGERY N/A 06/01/2022    ENDOSCOPY OF ILEAL CONDUIT performed by Guinevere Ferrari, MD at [facility] MAIN OR    CT  VISCERAL PERCUTANEOUS DRAIN  06/05/2022    CT VISCERAL PERCUTANEOUS DRAIN 06/05/2022 [facility] RAD CT    FOOT DEBRIDEMENT Bilateral 07/23/2022    BILATERAL TRANSMETATARSAL AMPUTATION performed by Rosana Fret, DPM at [facility] OR    IR TUNNELED CATHETER PLACEMENT GREATER THAN 5 YEARS  07/01/2022    IR TUNNELED CATHETER PLACEMENT GREATER THAN 5 YEARS 07/01/2022 [facility], APRN - NP [facility] ANGIO IR    LAPAROSCOPY N/A 06/01/2022    LAPAROSCOPY DIAGNOSTIC performed by Guinevere Ferrari, MD at [facility] OR    LAPAROTOMY N/A 06/01/2022    LAPAROTOMY EXPLORATORY performed by Guinevere Ferrari, MD at [facility] MA[facility] OR          Expanded or extensive additional review of patient history:   Lives With:  (from SNF)  Type of Home:  (from [facility] where he was for rehab)                             Has the patient had two or more falls in the past year or any fall with injury in the past year?: No        Hand Dominance: unknown     EXAMINATION OF PERFORMANCE DEFICITS:    Cognitive/Behavioral Status:  Orientation  Orientation Level: Oriented to place;Oriented to time;Oriented to situation;Oriented to person       Skin: see nursing notes, sacral wound observed    Edema: B hands edematous    Hearing:    WFL grossly assessed            Range of Motion:   AROM: Grossly decreased, non-functional         Strength:  Strength: Grossly decreased, non-functional      Coordination:  Coordination: Grossly decreased, non-functional     Coordination: Grossly decreased, non-functional                Functional Mobility and Transfers for ADLs:    Bed Mobility:     Bed Mobility Training  Bed Mobility Training: Yes  Rolling: Maximum assistance;Assist X2  Scooting: Total assistance;Assist X2    Transfers:     Art therapist: No                        ADL Assessment:                                                          Toileting: Dependent/Total              ADL Intervention and task modifications:        [employer] AM-PACTM "6  Clicks"  Daily Activity Inpatient Short Form  How much help from another person does the patient currently need... Total; A Lot A Little None   1.  Putting on and taking off regular lower body clothing? [x]   1 []   2 []   3 []   4   2.  Bathing (including washing, rinsing, drying)? [x]   1 []   2 []   3 []   4   3.  Toileting, which includes using toilet, bedpan or urinal? [x]  1 []   2 []   3 []   4   4.  Putting on and taking off regular upper body clothing? [x]   1 []   2 []   3 []   4   5.  Taking care of personal grooming such as brushing teeth? [x]   1 []   2 []   3 []   4   6.  Eating meals? []   1 [x]   2 []   3 []   4    2007, Trustees of , under license to [place], [place]. All rights reserved     Score: 7/24     Interpretation of Tool:  Represents clinically-significant functional categories (i.e. Activities of daily living).    Cutoff score 39.4 (19) correlates to a good likelihood of discharging home versus a facility  Diane U. , , , . [place], . , AM-PAC "6-Clicks" Functional Assessment Scores Predict Acute Care Hospital Discharge Destination, Physical Therapy, Volume 94, Issue 9, 03 July 2013, Pages [phone],    Pain Rating:  Not rated- with pain during rolling.      Activity Tolerance:   Poor    After treatment:   Patient left in no apparent distress in bed, Podus boots applied for pressure relief, and Patient offloaded in partial left side lying for pressure relief    COMMUNICATION/EDUCATION:   The patient's plan of care was discussed with: physical therapist and registered nurse    Patient Education  Education Given To: Patient  Education Provided: Role of Therapy;Plan of Care;Precautions  Education Method: Verbal  Education Outcome: Verbalized understanding    Thank you for this referral.  , OT  Minutes: 28    Occupational  Therapy Evaluation Charge Determination   History Examination Decision-Making   LOW Complexity : Brief history review  LOW Complexity: 1-3 Performance deficits relating to physical, cognitive, or psychosocial skills that result in activity limitations and/or participation restrictions LOW Complexity: No comorbidities that affect functional and  no verbal  or physical assist needed to complete eval tasks   Based on the above components, the patient evaluation is determined to be of the following complexity level: Low

## 2022-09-27 NOTE — Progress Notes (Cosign Needed)
[name]                                                                                          Hospitalist Progress Note  Mike Gip, APRN - NP  Answering service: [phone] OR [phone] from in house phone        Date of Service:  09/27/2022  NAME:  Jaime [name]  DOB:  1946/04/12  MRN:  [id number]       Admission Summary:   Per H&P: Jaime [name] is a 76 y.o. male with hx of dm ii, htn, esrd on hd, pvd s/p bilateral TMA, hx of shock 2/2 perforated viscus, liver abscess s/p ir drainage, sacral ulcer who presented to ed with complaints of rectal bleeding.  Patient states he had single episode of rectal bleeding which prompted him to seek ED care.  He denies any previous history of hemorrhoids.  Diverticulosis  The patient denies any fever, chills, chest or abdominal pain, nausea, vomiting, cough, congestion, recent illness, palpitations, or dysuria.     Remarkable vitals on ER Presentation: vss  Labs Remarkable for: wbc 17.6, alb 1.8, cr 2.23  ER Images: ct abd et pelvis:  Rectal wall thickening and mucosal enhancement suggests proctitis. Large  amount stool in the colon with no bowel obstruction.  3. No definite change in left hepatic fluid collection and adjacent ill-defined  stranding.  4. Heterogeneous low density in the pancreatic head and adjacent pancreatic  stranding may be unchanged to prior study performed without IV contrast.  Correlate for clinical signs of acute pancreatitis. Consider follow-up contrast  enhanced pancreas CT to document resolution..  5. Bibasilar atelectasis and pleural effusions  ER Rx: 1l ns bolus, levaquin and flagyl     Interval history / Subjective:      Patient seen and examined, lying in bed. Patient alert and oriented X 4.     Patient with ongoing hypotension. Received 25 g albumin and 500 ml LR bolus this morning. Deferring additional fluids/albumin due to volume overload in setting of ESRD. Patient on 2 L NC with coarse  bilateral breath sounds, suspect due to pulmonary edema rather than pneumonia. CXR yesterday showing possible pneumonia - will add vancomycin and check MRSA swab.    Evaluated by ICU for possible pressor needs - recommendation made to continue midodrine and monitor.    Nursing reports two dark brown BMs today. No further bright red blood from rectum.    Discussed plan with patient. Patient desires escalation of care if needed and ongoing full code status. Spoke with patient's wife over the phone to provide update.  Assessment & Plan:     Acute proctitis  Constipation, resolved  Rectal Bleed, resolving  -abd/pel CT: rectal wall thickening and mucosal enhancement suggests proctitis, large amount stool in colon with no bowel obstruction  -lipase 14  -GI following: no need for urgent colonoscopy  -RRT 11/25 for hypotension and bright red blood per rectum, Hgb 8.9 - 1 u PRBC ordered in setting of active bleeding  -abd/pel CTA (11/25): no definite extravasation of contrast  -monitor color and quantity of  stools - 2 dark brown BM on 11/26, no further bright red bleeding  -monitor Hgb, transfuse if <7 or active bleeding occurs -- Hgb 10.3 today  -continue BID pantoprazole  -continue levofloxacin and metronidazole -- change metronidazole to PO to decrease fluid intake  -start clear liquid diet    Hypotension  -SBP 60s-90s today  -evaluated by ICU today - determined to not have ICU needs at this time  -continue scheduled midodrine  -monitor vitals    Hypoxia  Possible PNA  -on 2 L NC with O2 sat 95-97%  -CXR (11/25): bibasilar airspace opacities concerning for infection  -add vancomycin, check MRSA swab  -WBC down trending - 19.2 --> 16.7; patient afebrile  -wean oxygen as able to maintain O2 sat >92%    ESRD on HD MWF  -Nephrology following  -continue home midodrine and renvela    Hypoglycemia, resolved  -discontinue D5W in setting of fluid overload  -start D10 at 20 ml/hr  -monitor glucose levels  -hypoglycemia protocol in  place    Mood Disorder  -continue home mirtazapine    PVD s/p toe amputation left foot  Chronic sacral wounds  Left heel ulcer  -wound care following     Code status: Full  Prophylaxis: SCDs  Care Plan discussed with: patient, patient's wife, RN, ICU  Anticipated Disposition: TBD  Inpatient  Cardiac monitoring: Telemetry    Critical Care Attestation:  This patient is unstable and critically ill. Due to a high probability of clinically significant, life threatening deterioration, the patient required my highest level of preparedness to intervene emergently and I personally spent this critical care time directly and personally managing the patient. This critical care time included obtaining a history; examining the patient; pulse oximetry; ordering and review of studies; arranging urgent treatment with development of a management plan; evaluation of patient's response to treatment; frequent reassessment; and, discussions with other providers and/or family. This critical care time was performed to assess and manage the high probability of imminent, life-threatening deterioration that could result in multi-organ failure and death. It was exclusive of separately billable procedures, treating other patients, and teaching time.    Time Spent:   I personally spent 35 minutes in providing critical care time.       Principal Problem:    Proctitis  Resolved Problems:    * No resolved hospital problems. *         Social Determinants of Health     Tobacco Use: Low Risk  (09/17/2022)    Patient History     Smoking Tobacco Use: Never     Smokeless Tobacco Use: Never     Passive Exposure: Not on file   Alcohol Use: Not on file   Financial Resource Strain: Not on file   Food Insecurity: Not on file   Transportation Needs: Not on file   Physical Activity: Not on file   Stress: Not on file   Social Connections: Not on file   Intimate Partner Violence: Not on file   Depression: Not on file   Housing Stability: Not on file    Interpersonal Safety: Not on file   Utilities: Not on file       Review of Systems:   Fatigue  Feels better than yesterday      Vital Signs:    Last 24hrs VS reviewed since prior progress note. Most recent are:  Vitals:    09/27/22 1400   BP:    Pulse: 75   Resp:  Temp:    SpO2:          Intake/Output Summary (Last 24 hours) at 09/27/2022 1407  Last data filed at 09/26/2022 2030  Gross per 24 hour   Intake 790.5 ml   Output --   Net 790.5 ml          Physical Examination:     I had a face to face encounter with this patient and independently examined them on 09/27/2022 as outlined below:          General : alert, oriented X4, awake, no acute distress  HEENT: PEERL, EOMI, dry mucus membranes  Neck: supple, no JVD  Chest: Clear to auscultation bilaterally, on 2 L NC  CVS: S1 S2 heard, Capillary refill less than 2 seconds  Abd: soft/non tender, non distended, hypoactive bowel sounds  Ext: no clubbing, no cyanosis, 1+ lower extremity edema  Neuro/Psych: pleasant mood and affect, CN 2-12 grossly intact, sensory grossly within normal limit  Skin: warm, pale, mottling noted to upper extremities and upper thighs    Data Review:    Review and/or order of clinical lab test      I have personally and independently reviewed all pertinent labs, diagnostic studies, imaging, and have provided independent interpretation of the same.     Labs:     Recent Labs     09/26/22  0920 09/26/22  1731 09/27/22  0315   WBC 19.2*  --  16.7*   HGB 8.9* 10.3* 10.3*   HCT 26.6* 30.7* 30.6*   PLT 87*  --  92*       Recent Labs     09/24/22  1524 09/26/22  0920 09/27/22  0315   NA 135* 137 134*   K 3.9 3.6 4.4   CL 101 103 103   CO2 32 30 27   BUN 34* 27* 27*       Recent Labs     09/24/22  1524 09/26/22  0920 09/27/22  0315   ALT 27 18 15    GLOB 4.0 3.1 3.1       No results for input(s): "INR", "APTT" in the last 72 hours.    Invalid input(s): "PTP"   No results for input(s): "TIBC", "FERR" in the last 72 hours.    Invalid input(s): "FE",  "PSAT"   No results found for: "FOL", "RBCF"   No results for input(s): "PH", "PCO2", "PO2" in the last 72 hours.  No results for input(s): "CPK" in the last 72 hours.    Invalid input(s): "CPKMB", "CKNDX", "TROIQ"  No results found for: "CHOL", "CHOLX", "CHLST", "CHOLV", "HDL", "HDLC", "LDL", "LDLC", "TGLX", "TRIGL"  No results found for: "GLUCPOC"    Notes reviewed from all clinical/nonclinical/nursing services involved in patient's clinical care. Care coordination discussions were held with appropriate clinical/nonclinical/ nursing providers based on care coordination needs.       Patients current active Medications were reviewed, considered, added and adjusted based on the clinical condition today.      Home Medications were reconciled to the best of my ability given all available resources at the time of admission. Route is PO if not otherwise noted.    Medications Reviewed:     Current Facility-Administered Medications   Medication Dose Route Frequency    midodrine (PROAMATINE) tablet 10 mg  10 mg Oral TID    0.9 % sodium chloride infusion   IntraVENous PRN    dextrose 5 % solution   IntraVENous Continuous  pantoprazole (PROTONIX) 40 mg in sodium chloride (PF) 0.9 % 10 mL injection  40 mg IntraVENous Q12H    glucose chewable tablet 16 g  4 tablet Oral PRN    dextrose bolus 10% 125 mL  125 mL IntraVENous PRN    Or    dextrose bolus 10% 250 mL  250 mL IntraVENous PRN    glucagon injection 1 mg  1 mg SubCUTAneous PRN    dextrose 10 % infusion   IntraVENous Continuous PRN    heparin (porcine) 1000 UNIT/ML injection 1,800 Units  1,800 Units IntraCATHeter PRN    And    heparin (porcine) 1000 UNIT/ML injection 1,800 Units  1,800 Units IntraCATHeter PRN    levoFLOXacin (LEVAQUIN) 500 MG/100ML infusion 500 mg  500 mg IntraVENous Q48H    albumin human 25% IV solution 25 g  25 g IntraVENous PRN    polyethylene glycol (GLYCOLAX) packet 17 g  17 g Oral Daily    mirtazapine (REMERON) tablet 7.5 mg  7.5 mg Oral Nightly     sevelamer (RENVELA) packet 2.4 g  2.4 g Oral TID WC    sodium chloride flush 0.9 % injection 5-40 mL  5-40 mL IntraVENous 2 times per day    sodium chloride flush 0.9 % injection 5-40 mL  5-40 mL IntraVENous PRN    0.9 % sodium chloride infusion   IntraVENous PRN    ondansetron (ZOFRAN-ODT) disintegrating tablet 4 mg  4 mg Oral Q8H PRN    Or    ondansetron (ZOFRAN) injection 4 mg  4 mg IntraVENous Q6H PRN    polyethylene glycol (GLYCOLAX) packet 17 g  17 g Oral Daily PRN    acetaminophen (TYLENOL) tablet 650 mg  650 mg Oral Q6H PRN    Or    acetaminophen (TYLENOL) suppository 650 mg  650 mg Rectal Q6H PRN    metronidazole (FLAGYL) 500 mg in 0.9% NaCl 100 mL IVPB premix  500 mg IntraVENous Q8H     ______________________________________________________________________  EXPECTED LENGTH OF STAY: Unable to retrieve estimated LOS  ACTUAL LENGTH OF STAY:          3                 Mike Gip, APRN - NP

## 2022-09-27 NOTE — Progress Notes (Signed)
[facility]   [address], [address] [place], [place] [address]  Phone: [phone]   [phone]    [email or web address]     Nephrology Progress Note    Patient Name : Jaime [name]     DOB : 03-11-1946     MRN : [id number]  Date of Admission : 09/18/2022  Date of Servive : 09/27/22    CC:  Follow up for ESRD       Assessment and Plan   ESRD-HD  -Dialyzes MWF at [facility]  -Access: [facility]: Placed 07/01/2022  -Next HD on Monday.  -Minimize IVF given worsening volume overload    Acute proctitis:  Rectal bleed  -Per primary team    Hypoglycemia:  -On D5W at 50 cc/h    Anemia in CKD:  -Continue ESA    Chronic hypotension:  -Continue midodrine  -IV albumin PRN    PVD: S/p B/L TMA  DM2  Hx of pancreatitis  Hx of liver abscess s/p drainage  Sacral ulcer     Interval History:  Seen and examined.  Was receiving a 500 cc of IV albumin bolus at the time of my visit.  Reports slight SOB.  CTA without any obvious source for rectal bleeding.  H&H stable.  Mental status back to baseline    Review of Systems: A comprehensive review of systems was negative.    Current Medications:   Current Facility-Administered Medications   Medication Dose Route Frequency    midodrine (PROAMATINE) tablet 10 mg  10 mg Oral TID    0.9 % sodium chloride infusion   IntraVENous PRN    dextrose 5 % solution   IntraVENous Continuous    pantoprazole (PROTONIX) 40 mg in sodium chloride (PF) 0.9 % 10 mL injection  40 mg IntraVENous Q12H    glucose chewable tablet 16 g  4 tablet Oral PRN    dextrose bolus 10% 125 mL  125 mL IntraVENous PRN    Or    dextrose bolus 10% 250 mL  250 mL IntraVENous PRN    glucagon injection 1 mg  1 mg SubCUTAneous PRN    dextrose 10 % infusion   IntraVENous Continuous PRN    heparin (porcine) 1000 UNIT/ML injection 1,800 Units  1,800 Units IntraCATHeter PRN    And    heparin (porcine) 1000 UNIT/ML injection 1,800 Units  1,800 Units IntraCATHeter PRN    levoFLOXacin (LEVAQUIN) 500  MG/100ML infusion 500 mg  500 mg IntraVENous Q48H    albumin human 25% IV solution 25 g  25 g IntraVENous PRN    polyethylene glycol (GLYCOLAX) packet 17 g  17 g Oral Daily    mirtazapine (REMERON) tablet 7.5 mg  7.5 mg Oral Nightly    sevelamer (RENVELA) packet 2.4 g  2.4 g Oral TID WC    sodium chloride flush 0.9 % injection 5-40 mL  5-40 mL IntraVENous 2 times per day    sodium chloride flush 0.9 % injection 5-40 mL  5-40 mL IntraVENous PRN    0.9 % sodium chloride infusion   IntraVENous PRN    ondansetron (ZOFRAN-ODT) disintegrating tablet 4 mg  4 mg Oral Q8H PRN    Or    ondansetron (ZOFRAN) injection 4 mg  4 mg IntraVENous Q6H PRN    polyethylene glycol (GLYCOLAX) packet 17 g  17 g Oral Daily PRN    acetaminophen (TYLENOL) tablet 650 mg  650 mg Oral Q6H PRN  Or    acetaminophen (TYLENOL) suppository 650 mg  650 mg Rectal Q6H PRN    metronidazole (FLAGYL) 500 mg in 0.9% NaCl 100 mL IVPB premix  500 mg IntraVENous Q8H      Allergies   Allergen Reactions    Augmentin [Amoxicillin-Pot Clavulanate] Hives     Tolerated ceftriaxone 06/2022    Codeine Itching     Unknown reaction    Oxycodone Itching       Objective:  Vitals:    Vitals:    09/27/22 0200 09/27/22 0201 09/27/22 0400 09/27/22 0600   BP: (!) 96/50      Pulse: 62 62 63 75   Resp: 16      Temp:       TempSrc:       SpO2: 100%      Weight:       Height:         Intake and Output:  No intake/output data recorded.  11/24 1901 - 11/26 0700  In: 890.5 [P.O.:100]  Out: 0     Physical Examination:  General: ill looking  Neck:  Supple, no mass  Resp:  Lungs CTA B/L, no wheezing , normal respiratory effort  CV:  RRR,  no murmur or rub, LE edema  GI:  Soft, NT, + BS, no HS megaly  Neurologic:  Non focal    Dialysis Access : [facility]    []     High complexity decision making was performed  []     Patient is at high-risk of decompensation with multiple organ involvement    Lab Data Personally Reviewed: I have reviewed all the pertinent labs, microbiology data and  radiology studies during assessment.    Labs:  Recent Labs     09/20/2022  1524 09/26/22  0920 09/26/22  0931 09/27/22  0315   NA 135* 137  --  134*   K 3.9 3.6  --  4.4   CL 101 103  --  103   CO2 32 30  --  27   GLUCOSE 81 81  --  96   BUN 34* 27*  --  27*   CREATININE 2.23* 1.90* 1.8* 2.09*   CALCIUM 8.9 8.3*  --  8.3*         Recent Labs     09/26/22  0704 09/26/22  0920 09/26/22  1731 09/27/22  0315   WBC 21.4* 19.2*  --  16.7*   RBC 3.10* 3.07*  --  3.54*   HGB 9.2*  9.3* 8.9* 10.3* 10.3*   HCT 27.1*  27.0* 26.6* 30.7* 30.6*   MCV 87.4 86.6  --  86.4   MCH 29.7 29.0  --  29.1   MCHC 33.9 33.5  --  33.7   RDW 19.6* 19.4*  --  19.2*   PLT 84* 87*  --  92*   MPV 11.5 10.5  --  11.5       Recent Labs     09/18/2022  1524 09/26/22  0920 09/27/22  0315   GLOB 4.0 3.1 3.1       No results for input(s): "INR", "APTT" in the last 72 hours.    Invalid input(s): "PTP"   No results for input(s): "CPK", "CKMB", "TROPONINI" in the last 72 hours.    Invalid input(s): "B-NP"  Invalid input(s): "PHI", "PCO2I", "PO2I", "FIO2I"     Ventilator:       Microbiology:  No results found for: "SDES"  No components found  for: "CULT"      I have reviewed the flowsheets.  Chart and Pertinent Notes have been reviewed.   No change in PMH ,family and social history from Consult note.      Sunday Shams, MD  [facility]

## 2022-09-27 NOTE — Progress Notes (Signed)
Pharmacist Note - Vancomycin Dosing (Hemodialysis Patient)    Consult provided for this 76 y.o. male for indication of Sepsis.  This patient is also on the following antibiotic regimen(s): Vancomycin, Flagyl, Levaquin  Patient on vancomycin PTA? NO     Lab Results   Component Value Date/Time    WBC 16.7 09/27/2022 03:15 AM    BUN 27 09/27/2022 03:15 AM   Temp (24hrs), Avg:97.9 F (36.6 C), Min:97.3 F (36.3 C), Max:98.2 F (36.8 C)      Estimated CrCl:  ESRD on HD (Monday/Wednesday/Friday)    Cultures:  None thus far    MRSA Swab ordered (if applicable)? YES    For this HD patient, will initiate therapy with a loading dose of Vancomycin 2000 mg, to be followed with a dose of 1000 mg after each HD session.  Dose will be adjusted to maintain a pre-HD trough of approximately 20 - 25 mcg/mL for therapeutic goal of 15 - 20 mcg/mL as approximately 35% of drug will be removed by HD filtration.  Pharmacy to follow patient daily and order levels / make dose adjustments as appropriate.    Kirke Corin, PharmD  Clinical Pharmacist  [facility] [phone])

## 2022-09-27 NOTE — Plan of Care (Signed)
Problem: Physical Therapy - Adult  Goal: By Discharge: Performs mobility at highest level of function for planned discharge setting.  See evaluation for individualized goals.  Description: FUNCTIONAL STATUS PRIOR TO ADMISSION: Patient was independent prior to the end of July when he was admitted to [facility] with a perforated bowel. Had two month admission with multiple complications including sacral wounds and bilateral transmetatarsal amputations, + new HD dependency. Discharged to [facility] for rehab where he has been for two months. States he is hoyer lifted to w/c but is not yet standing or ambulating. Of note, he was heel WB bilaterally for transfers only during his [facility] admission (unclear if his precautions remain at this time).     HOME SUPPORT PRIOR TO ADMISSION: The patient lived with his wife in [place], [place] prior to admission to [facility] (was in town for the [place]).    Physical Therapy Goals  Initiated 09/27/2022  1.  Patient will move from supine to sit and sit to supine, scoot up and down, and roll side to side in bed with moderate assistance within 7 day(s).    2.  Patient will sit EOB x5 min with moderate assist x2 within 7 days.   3.  Patient will participate in supine HEP with minimal assistance within 7 days.   4.  Patient will tolerate OOB to chair via hoyer lift x60 min within 7 days.     Outcome: Progressing   PHYSICAL THERAPY EVALUATION    Patient: Jaime [name] (76 y.o. male)  Date: 09/27/2022  Primary Diagnosis: Hemorrhage of rectum and anus [K62.5]  Proctitis [K62.89]  Skin ulcer of sacrum, unspecified ulcer stage ([place]) [L98.429]       Precautions:  (sacral wounds)                    ASSESSMENT :   DEFICITS/IMPAIRMENTS:   The patient is limited by decreased functional mobility, ROM, strength, sensation, activity tolerance, endurance, coordination, increased pain levels following admission for hemorrhage of rectum and anus. He was received supine in bed, agreeable to therapy but in  significant pain with rolling for hygiene (max Ax2). Noted black eschar on his wounds of his B transmet amputations. Weak and congested cough noted throughout. BP with low readings directly after rolling for hygiene but then recovered (unsure of accuracy of low readings). Recommend return to SNF at d/c (is it possible to get him transferred closer to home?).     Based on the impairments listed above recommend SNF at d/c.    Patient will benefit from skilled intervention to address the above impairments.    Functional Outcome Measure:  The patient scored 6/24 on the AMPAC outcome measure which is indicative of increased likelihood of rehab at d/c.           PLAN :  Recommendations and Planned Interventions:   bed mobility training, transfer training, therapeutic exercises, neuromuscular re-education, edema management/control, patient and family training/education, and therapeutic activities    Frequency/Duration: Patient will be followed by physical therapy to address goals, PT Plan of Care: 4 times/week to address goals.    Recommendation for discharge: (in order for the patient to meet his/her long term goals): Therapy up to 5 days/week in Skilled nursing facility    Other factors to consider for discharge: patient's current support system is unable to meet their requirements for physical assistance, high risk for falls, not safe to be alone, concern for safely navigating or managing the home environment, new weight  bearing restrictions limiting activity or patient is unable to maintain, and currently total care     IF patient discharges home will need the following DME: continuing to assess with progress         SUBJECTIVE:   Patient stated "I was here for the [place] and never even saw the race."    OBJECTIVE DATA SUMMARY:       Past Medical History:   Diagnosis Date    Diabetes mellitus (HCC)     Hemodialysis patient (HCC) 07/12/2022    Hypertension      Past Surgical History:   Procedure Laterality Date     BLADDER SURGERY N/A 06/01/2022    ENDOSCOPY OF ILEAL CONDUIT performed by Guinevere Ferrari, MD at [facility] MAIN OR    CT VISCERAL PERCUTANEOUS DRAIN  06/05/2022    CT VISCERAL PERCUTANEOUS DRAIN 06/05/2022 [facility] CT    FOOT DEBRIDEMENT Bilateral 07/23/2022    BILATERAL TRANSMETATARSAL AMPUTATION performed by Rosana Fret, DPM at [facility]    IR TUNNELED CATHETER PLACEMENT GREATER THAN 5 YEARS  07/01/2022    IR TUNNELED CATHETER PLACEMENT GREATER THAN 5 YEARS 07/01/2022 Navos [place], APRN - NP [facility] ANGIO IR    LAPAROSCOPY N/A 06/01/2022    LAPAROSCOPY DIAGNOSTIC performed by Guinevere Ferrari, MD at [facility]    LAPAROTOMY N/A 06/01/2022    LAPAROTOMY EXPLORATORY performed by Guinevere Ferrari, MD at [facility]       Home Situation:  Social/Functional History  Lives With:  (from SNF)  Type of Home:  (from [facility] where he was for rehab)  Has the patient had two or more falls in the past year or any fall with injury in the past year?: No  Receives Help From: Other (comment) (staff at rehab facility)  ADL Assistance: Needs assistance  Ambulation Assistance: Non-ambulatory  Transfer Assistance: Needs assistance (hoyert lift at rehab)    Cognitive/Behavioral Status:  Orientation  Orientation Level: Oriented to place;Oriented to time;Oriented to situation;Oriented to person  Skin: sacral wounds + eschar on B TMAs    Edema: +2 pitting edema UEs      Strength:    Strength: Grossly decreased, non-functional    Coordination:  Coordination: Grossly decreased, non-functional    Range Of Motion:  AROM: Grossly decreased, non-functional       Functional Mobility:  Bed Mobility:  Bed Mobility Training  Bed Mobility Training: Yes  Rolling: Maximum assistance;Assist X2  Scooting: Total assistance;Assist X2  Transfers:  Art therapist: No  [employer] AM-PAC      Basic Mobility Inpatient Short Form (6-Clicks) Version 2    How much help is needed turning from your back to your side while in a flat bed without using bedrails?: Total  How much help is needed moving from lying on your back to sitting on the side of a flat bed without using bedrails?: Total  How much help is needed moving to and from a bed to a chair?: Total  How much help is needed standing up from a chair using your arms?: Total  How much help is needed walking in hospital room?: Total  How much help is needed climbing 3-5 steps with a railing?: Total    AM-PAC Inpatient Mobility Raw Score : 6  AM-PAC Inpatient T-Scale Score : 23.55     Cutoff score ?171,2,3 had higher odds of discharging home with home health or need of SNF/IPR.    1. Emelia Loron, Janeece Riggers, Vinoth Fransico Meadow, Lupe Carney Passek, Thornton Dales. Cassandria Anger.  Validity of the AM-PAC "6-Clicks" Inpatient Daily Activity and Basic Mobility Short Forms. Physical Therapy Mar 2014, [phone] (3) 379-391; DOI: 10.2522/ptj.20130199  2. Venetia Night. Association of AM-PAC "6-Clicks" Basic Mobility and Daily Activity Scores With Discharge Destination. Phys Ther. 2021 Apr 4;101(4):pzab043. doi: 10.1093/ptj/pzab043. PMID: 59093112.  3. Herbold J, Rajaraman D, Lubertha Basque, Agayby K, [place] S. Activity Measure for Post-Acute Care "6-Clicks" Basic Mobility Scores Predict Discharge Destination After Acute Care Hospitalization in Select Patient Groups: A Retrospective, Observational Study. Arch Rehabil Res Clin Transl. 2022 Jul 16;4(3):100204. doi: 10.1016/j.arrct.1624.469507. PMID: 22575051; PMCID: [id number].  4. Josefina Do, Coster W, Ni P. AM-PAC Short Forms Manual 4.0. Revised 12/2018.         Activity Tolerance:   Fair , requires frequent rest breaks, and desaturates with activity and requires oxygen    After treatment:   Call bell within  reach, Side rails x3, Podus boots applied for pressure relief, and Patient offloaded in partial left side lying for pressure relief    COMMUNICATION/EDUCATION:   The patient's plan of care was discussed with: occupational therapist and registered nurse         Thank you for this referral.  Nyra Anspaugh Arletta Bale, PT, DPT  Minutes: 27      Physical Therapy Evaluation Charge Determination   History Examination Presentation Decision-Making   HIGH Complexity :3+ comorbidities / personal factors will impact the outcome/ POC  HIGH Complexity : 4+ Standardized tests and measures addressing body structure, function, activity limitation and / or participation in recreation  LOW Complexity : Stable, uncomplicated  AM-PAC  HIGH    Based on the above components, the patient evaluation is determined to be of the following complexity level: Low

## 2022-09-28 LAB — BASIC METABOLIC PANEL
Anion Gap: 6 mmol/L (ref 5–15)
BUN/Creatinine Ratio: 11 — ABNORMAL LOW (ref 12–20)
BUN: 26 MG/DL — ABNORMAL HIGH (ref 6–20)
CO2: 26 mmol/L (ref 21–32)
Calcium: 8.5 MG/DL (ref 8.5–10.1)
Chloride: 104 mmol/L (ref 97–108)
Creatinine: 2.31 MG/DL — ABNORMAL HIGH (ref 0.70–1.30)
Est, Glom Filt Rate: 29 mL/min/{1.73_m2} — ABNORMAL LOW (ref >60)
Glucose: 102 mg/dL — ABNORMAL HIGH (ref 65–100)
Potassium: 3.6 mmol/L (ref 3.5–5.1)
Sodium: 136 mmol/L (ref 136–145)

## 2022-09-28 LAB — POCT GLUCOSE
POC Glucose: 78 mg/dL (ref 65–117)
POC Glucose: 105 mg/dL (ref 65–117)
POC Glucose: 89 mg/dL (ref 65–117)
POC Glucose: 78 mg/dL (ref 65–117)

## 2022-09-28 LAB — CBC WITH AUTO DIFFERENTIAL
Absolute Immature Granulocyte: 0.2 10*3/uL — ABNORMAL HIGH (ref 0.00–0.04)
Basophils %: 0 % (ref 0–1)
Basophils Absolute: 0.1 10*3/uL (ref 0.0–0.1)
Eosinophils %: 0 % (ref 0–7)
Eosinophils Absolute: 0 10*3/uL (ref 0.0–0.4)
Hematocrit: 33.4 % — ABNORMAL LOW (ref 36.6–50.3)
Hemoglobin: 11.4 g/dL — ABNORMAL LOW (ref 12.1–17.0)
Immature Granulocytes: 1 % — ABNORMAL HIGH (ref 0.0–0.5)
Lymphocytes %: 13 % (ref 12–49)
Lymphocytes Absolute: 2 10*3/uL (ref 0.8–3.5)
MCH: 29.5 PG (ref 26.0–34.0)
MCHC: 34.1 g/dL (ref 30.0–36.5)
MCV: 86.3 FL (ref 80.0–99.0)
MPV: 11.2 FL (ref 8.9–12.9)
Monocytes %: 4 % — ABNORMAL LOW (ref 5–13)
Monocytes Absolute: 0.6 10*3/uL (ref 0.0–1.0)
Neutrophils %: 82 % — ABNORMAL HIGH (ref 32–75)
Neutrophils Absolute: 12.6 10*3/uL — ABNORMAL HIGH (ref 1.8–8.0)
Nucleated RBCs: 0.2 PER 100 WBC — ABNORMAL HIGH
Platelets: 108 10*3/uL — ABNORMAL LOW (ref 150–400)
RBC: 3.87 M/uL — ABNORMAL LOW (ref 4.10–5.70)
RDW: 18.9 % — ABNORMAL HIGH (ref 11.5–14.5)
WBC: 15.5 10*3/uL — ABNORMAL HIGH (ref 4.1–11.1)
nRBC: 0.03 10*3/uL — ABNORMAL HIGH (ref 0.00–0.01)

## 2022-09-28 LAB — PTH, INTACT
Calcium: 8.5 MG/DL (ref 8.5–10.1)
Pth Intact: 6.8 pg/mL — ABNORMAL LOW (ref 18.4–88.0)

## 2022-09-28 LAB — PROCALCITONIN: Procalcitonin: 0.62 ng/mL

## 2022-09-28 LAB — PHOSPHORUS: Phosphorus: 1 MG/DL — ABNORMAL LOW (ref 2.6–4.7)

## 2022-09-28 MED ORDER — VIRT-CAPS 1 MG PO CAPS
1 MG | Freq: Every day | ORAL | Status: AC
Start: 2022-09-28 — End: 2022-09-30
  Administered 2022-09-28 – 2022-09-30 (×3): 1 via ORAL

## 2022-09-28 MED ORDER — VIAL2BAG ADAPTOR (20 MM)
750 MG | Freq: Once | INTRAVENOUS | Status: AC
Start: 2022-09-28 — End: 2022-09-28
  Administered 2022-09-28: 22:00:00 750 mg via INTRAVENOUS

## 2022-09-28 MED ORDER — POTASSIUM PHOSPHATE 3 MMOL/ML IV SOLN (MIXTURES ONLY)
15 mmol/5 mL | Freq: Once | INTRAVENOUS | Status: AC
Start: 2022-09-28 — End: 2022-09-28
  Administered 2022-09-28: 22:00:00 20 mmol via INTRAVENOUS

## 2022-09-28 MED ORDER — ALBUMIN HUMAN 25 % IV SOLN
25 % | Freq: Once | INTRAVENOUS | Status: AC
Start: 2022-09-28 — End: 2022-09-28
  Administered 2022-09-28: 14:00:00 50 g via INTRAVENOUS

## 2022-09-28 MED FILL — MIDODRINE HCL 5 MG PO TABS: 5 MG | ORAL | Qty: 3

## 2022-09-28 MED FILL — SEVELAMER CARBONATE 2.4 G PO PACK: 2.4 g | ORAL | Qty: 2.4

## 2022-09-28 MED FILL — VANCOMYCIN HCL 750 MG IV SOLR: 750 MG | INTRAVENOUS | Qty: 750

## 2022-09-28 MED FILL — MIRTAZAPINE 15 MG PO TABS: 15 MG | ORAL | Qty: 1

## 2022-09-28 MED FILL — LEVOFLOXACIN IN D5W 500 MG/100ML IV SOLN: 500 MG/100ML | INTRAVENOUS | Qty: 100

## 2022-09-28 MED FILL — PANTOPRAZOLE SODIUM 40 MG IV SOLR: 40 MG | INTRAVENOUS | Qty: 40

## 2022-09-28 MED FILL — TRIPHROCAPS 1 MG PO CAPS: 1 MG | ORAL | Qty: 1

## 2022-09-28 MED FILL — METRONIDAZOLE 250 MG PO TABS: 250 MG | ORAL | Qty: 2

## 2022-09-28 MED FILL — ALBUTEIN 25 % IV SOLN: 25 % | INTRAVENOUS | Qty: 200

## 2022-09-28 MED FILL — POTASSIUM PHOSPHATES(66 MEQ K) 45 MMOLE/15ML IV SOLN: 45 MMOLE/15ML | INTRAVENOUS | Qty: 6.67

## 2022-09-28 MED FILL — ACETAMINOPHEN 325 MG PO TABS: 325 MG | ORAL | Qty: 2

## 2022-09-28 NOTE — Consults (Signed)
Comprehensive Nutrition Assessment    Type and Reason for Visit: Initial, Consult, Wound    Nutrition Recommendations/Plan:     Recommend replete Phos    Consider holding Renvela until pt taking significant PO    Recommend starting daily Nephrocap    Advance diet as medically feasible per GI/ MD    RD started clear liquid ONS until diet advanced         Malnutrition Assessment:  Malnutrition Status:  Severe malnutrition (09/28/22 1107)    Context:  Chronic Illness     Findings of the 6 clinical characteristics of malnutrition:  Energy Intake:  Mild decrease in energy intake (Comment)  Weight Loss:  Greater than 10% over 6 months (>25% of BW in ~5 months)     Body Fat Loss:  Severe body fat loss Orbital   Muscle Mass Loss:  Severe muscle mass loss Temples (temporalis), Clavicles (pectoralis & deltoids), Scapula (trapezius)  Fluid Accumulation:  No significant fluid accumulation    Grip Strength:  Not Performed       Nutrition Assessment:    PMHx includes DM, HTN, ESRD on HD, PVD s/p bilat TMA, hx of shock 2/2 perforated viscus, liver abscess s/p drainage, sacral ulcer.  Presented with c/o rectal bleeding.  Admitted with acute proctitis, rectal bleed - resolving.  GI consulted, no need for urgent colonoscopy.  S/p RRT 11/25 for hypotension and BRBPR.  Tx to IMCU.  ICU evaluation 11/26 - no ICU needs, but high likelihood of decompensation.  Hypoglycemia - resolved.  D5 switched to D10, running @ 20 mL/hr d/t fluid overload.    RD consulted for poor PO intake.  RD notes from pt's [facility] stay over Summer/ Fall reviewed (was admitted from 7/30-10/2/23).  Visited with pt.  Receiving HD.  Received green jello and chicken broth for breakfast, so had no desire to consume.    Reports UBW prior to starting HD was 234 lb.  He states at home, prior to this admission, he was eating well and no overt changes to intake.  However, confirms ongoing weight loss which based on current weight is 60 lb in the past 5 months (>25% of his BW).   We discussed increased needs with HD and that even though he might be eating as he was prior to starting, he needs to increase calories and protein to prevent additional weight loss.  Exhibits significant orbital fat loss and temporal + clavicular/shoulder muscle wasting.  He meet severe, chronic PCM criteria.  Good dentition, denies chewing/ swallowing issues.  In general, prefers Ensure over Nepro, but tires of both.  He is open to trying [employer], [employer], and [employer].    Significantly low Phos.  Has Renvela ordered, but hasn't been given since AM of 11/24 since pt is not eating.        Nutritionally Significant Medications:  Albumin, levaquin, flagyl, midodrine, Remeron, Protoinx, vancomycin  D10 @ 20 mL/hr (163 kcal)      Estimated Daily Nutrient Needs:  Energy Requirements Based On: Kcal/kg  Weight Used for Energy Requirements: Admission (78.5 kg)  Energy (kcal/day): 2300-2600 (30-33 kcal/kg)  Weight Used for Protein Requirements: Admission  Protein (g/day): 115-140 (1.5-1.8 gm/kg or at least 20%)  Method Used for Fluid Requirements: Standard Renal  Fluid (ml/day): 500 mL + OP    Nutrition Related Findings:   Edema: Generalized      RUE Edema: +2  LUE Edema: +2  Last BM: 09/26/22    Wounds:   Wound Type: Multiple, Pressure Injury, Unstageable  1. POA Unstageable Pressure Injury to Right Heel  2. POA Wounds to Bilateral Buttocks  3. POA Healing TMA Site to Left Foot    Current Nutrition Therapies:  Diet: clear liquids  Supplements: none at this time  Meal Intake:   Patient Vitals for the past 168 hrs:   PO Meals Eaten (%)   09/25/22 2130 76 - 100%     Supplement Intake:  No data found.  Nutrition Support: none at this time      Anthropometric Measures:  Height: 172.7 cm (5\' 8" )  Ideal Body Weight (IBW): 154 lbs (70 kg)    Admission Body Weight: 78.5 kg (173 lb 1 oz)  Current Body Weight: 78.8 kg (173 lb 11.6 oz), 112.8 % IBW. Weight Source: Bed Scale  Current BMI (kg/m2):  26.4  Usual Body Weight: 106.1 kg (234 lb)  % Weight Change (Calculated): -25.8  Weight Adjustment For: No Adjustment                 BMI Categories: Overweight (BMI 25.0-29.9)    Weight History Weight - Scale Weight - Scale Weight Method   09/25/2022 173 lbs 12 oz 78.8 kg -   October 20, 2022 173 lbs 1 oz  173 lbs 1 oz 78.5 kg  78.5 kg Bed scale  Bed scale   07/22/2022 202 lbs 10 oz 91.9 kg Bed scale   07/19/2022 212 lbs 8 oz 96.4 kg Bed scale   07/17/2022 216 lbs 4 oz 98.1 kg Bed scale   07/15/2022 213 lbs 14 oz 97 kg Bed scale   07/06/2022 223 lbs 5 oz 101.3 kg Bed scale   06/22/2022 234 lbs 13 oz  239 lbs 3 oz  230 lbs 13 oz 106.5 kg  108.5 kg  104.7 kg Estimated  Bed scale  Bed scale   06/18/2022 219 lbs 13 oz 99.7 kg Bed scale   06/10/2022 237 lbs 7 oz 107.7 kg -   06/04/2022 228 lbs 10 oz 103.7 kg Bedside scale   06/02/2022 224 lbs 101.6 kg -   05/31/2022 224 lbs 14 oz 102 kg Bedside scale       Nutrition Diagnosis:   Inadequate oral intake related to altered GI function as evidenced by NPO or clear liquid status due to medical condition  Severe malnutrition, In context of chronic illness related to inadequate protein-energy intake, increase demand for energy/nutrients, renal dysfunction as evidenced by Criteria as identified in malnutrition assessment    Nutrition Interventions:   Food and/or Nutrient Delivery: Continue Current Diet, Start Oral Nutrition Supplement  Nutrition Education/Counseling: Counseling initiated  Coordination of Nutrition Care: Continue to monitor while inpatient, Interdisciplinary Rounds       Goals:     Goals: other (specify)  Specify Other Goals: PO intake >/=50% of meals beyond clear liquids with 1-2 ONS daily by next RD assessment    Nutrition Monitoring and Evaluation:   Behavioral-Environmental Outcomes: None Identified  Food/Nutrient Intake Outcomes: Diet Advancement/Tolerance, Food and Nutrient Intake, Supplement Intake, Vitamin/Mineral Intake  Physical Signs/Symptoms Outcomes: Biochemical Data,  GI Status, Fluid Status or Edema, Hemodynamic Status, Meal Time Behavior, Nutrition Focused Physical Findings, Skin, Weight    Discharge Planning:    Continue Oral Nutrition Supplement     Recent Labs     09/26/22  0920 09/26/22  0931 09/27/22  0315 09/28/22  0415   GLUCOSE 81  --  96 102*  BUN 27*  --  27* 26*   CREATININE 1.90* 1.8* 2.09* 2.31*   NA 137  --  134* 136   K 3.6  --  4.4 3.6   CL 103  --  103 104   CO2 30  --  27 26   CALCIUM 8.3*  --  8.5  8.3* 8.5   PHOS  --   --   --  1.0*       Recent Labs     09/26/22  0920 09/27/22  0315   ALT 18 15   AST 29 40*   ALKPHOS 114 105   BILITOT 1.2* 1.4*   GLOB 3.1 3.1       Recent Labs     09/27/22  0315 09/28/22  0727   WBC 16.7* 15.5*   HGB 10.3* 11.4*   HCT 30.6* 33.4*   PLT 92* 108*       Recent Labs     09/25/22  2141 09/26/22  0056 09/26/22  0603 09/26/22  0931 09/26/22  0944 09/26/22  1244 09/26/22  1917 09/26/22  2211 09/27/22  0846 09/27/22  1317 09/27/22  1710 09/27/22  2045 09/28/22  0616 09/28/22  1205   POCGLU 59* 90 76 67* 89 100 90 122* 94 74 67 78 105 89       Lab Results   Component Value Date/Time    LABA1C 7.7 06/02/2022 12:22 PM    EAG 174 06/02/2022 12:22 PM       Jaime [name] Jaime [name], RD  Available via [employer]

## 2022-09-28 NOTE — Plan of Care (Signed)
Problem: Physical Therapy - Adult  Goal: By Discharge: Performs mobility at highest level of function for planned discharge setting.  See evaluation for individualized goals.  Description: FUNCTIONAL STATUS PRIOR TO ADMISSION: Patient was independent prior to the end of July when he was admitted to [facility] with a perforated bowel. Had two month admission with multiple complications including sacral wounds and bilateral transmetatarsal amputations, + new HD dependency. Discharged to [facility] for rehab where he has been for two months. States he is hoyer lifted to w/c but is not yet standing or ambulating. Of note, he was heel WB bilaterally for transfers only during his [facility] admission (unclear if his precautions remain at this time).     HOME SUPPORT PRIOR TO ADMISSION: The patient lived with his wife in [place], [place] prior to admission to [facility] (was in town for the [place]).    Physical Therapy Goals  Initiated 09/27/2022  1.  Patient will move from supine to sit and sit to supine, scoot up and down, and roll side to side in bed with moderate assistance within 7 day(s).    2.  Patient will sit EOB x5 min with moderate assist x2 within 7 days.   3.  Patient will participate in supine HEP with minimal assistance within 7 days.   4.  Patient will tolerate OOB to chair via hoyer lift x60 min within 7 days.     Outcome: Progressing     PHYSICAL THERAPY TREATMENT    Patient: Jaime [name] (76 y.o. male)  Date: 09/28/2022  Diagnosis: Hemorrhage of rectum and anus [K62.5]  Proctitis [K62.89]  Skin ulcer of sacrum, unspecified ulcer stage ([place]) [L98.429] Proctitis      Precautions:  (sacral wounds)                    ASSESSMENT:  Patient continues to benefit from skilled PT services and is progressing towards goals. Patient initially deferred PT today, but with encouragement, he is agreeable to sit EOB. Patient required max assist x 2 supine to sit. Patient also required 2-3 minutes of weight shifting training before  he was able to sit unsupported. Once steady, Patient is able to tolerate sitting x 10-12 minutes. Completed LAQ through partial range bila x 5 reps without losing balance posteriorly. Reviewed the importance of bed exercises. Patient repositioned on his right side, bilat PRAFO boots on. Continue to recommend SNF rehab upon discharge. Patient is hopeful to get SNF placement closer to his home in [place].     Recommend bed in chair position for all meals as appropriate. Will need official WB status to initiate any standing trials.          PLAN:  Patient continues to benefit from skilled intervention to address the above impairments.  Continue treatment per established plan of care.    Recommendation for discharge: (in order for the patient to meet his/her long term goals): Therapy up to 5 days/week in Skilled nursing facility    Other factors to consider for discharge: no support system, high risk for falls, not safe to be alone, concern for safely navigating or managing the home environment, and new weight bearing restrictions limiting activity or patient is unable to maintain    IF patient discharges home will need the following DME: continuing to assess with progress       SUBJECTIVE:   Patient stated, "I haven't been able to stand at all."    OBJECTIVE DATA SUMMARY:   Critical  Behavior:     Cognition  Overall Cognitive Status: Exceptions  Arousal/Alertness: Delayed responses to stimuli;Inconsistent responses to stimuli  Following Commands: Follows one step commands with increased time;Follows one step commands with repetition  Attention Span: Attends with cues to redirect;Difficulty attending to directions  Safety Judgement: Decreased awareness of need for assistance  Problem Solving: Assistance required to identify errors made;Assistance required to generate solutions  Insights: Decreased awareness of deficits  Initiation: Requires cues for some  Sequencing: Requires cues for some    Functional Mobility  Training:  Bed Mobility:  Bed Mobility Training  Bed Mobility Training: Yes  Overall Level of Assistance: Maximum assistance;Assist X2  Interventions: Verbal cues;Weight shifting training/pressure relief  Rolling: Maximum assistance;Assist X2  Supine to Sit: Maximum assistance;Assist X2  Sit to Supine: Maximum assistance;Assist X2  Scooting: Total assistance  Transfers:  Art therapist: No  Balance:  Balance  Sitting: Impaired  Sitting - Static: Fair (occasional)  Sitting - Dynamic: Fair (occasional)          Pain Rating:  9/10 "bed sores"   Pain Intervention(s):   nursing notified and addressing and repositioning    Activity Tolerance:   Fair , requires frequent rest breaks, and desaturates with activity and requires oxygen    After treatment:   Call bell within reach, Side rails x3, and Patient offloaded in partial right side lying for pressure relief      COMMUNICATION/EDUCATION:   The patient's plan of care was discussed with: occupational therapist and registered nurse    Patient Education  Education Given To: Patient  Education Provided: Role of Therapy;Plan of Care;Precautions;Fall Prevention Strategies;Home Exercise Program      Farron Lafond Bethanie Dicker, [place], DPT  Geriatric Clinical Specialist    Minutes: [phone]

## 2022-09-28 NOTE — Progress Notes (Signed)
Patient is currently on HD. Will follow up this PM for treatment as time allows. Continue to recommend SNF rehab upon discharge.    Margaret Staggs Doreatha Martin PT, DPT  Geriatric Clinical Specialist

## 2022-09-28 NOTE — Progress Notes (Signed)
[facility] [place] [address]       GI PROGRESS NOTE  Jaime [name], [place]  [phone] office  NP/PA in-hospital M-F until 4:30PM  After 5PM or on weekends, please call operator for physician on call      NAME: Jaime [name]   DOB:  Jun 23, 1946   MRN:  [id number]       Subjective:     Patient resting in bed, active with HD, patient reports MWF dialysis. He mentions a loose bowel movement this morning, he says this is normal for him. Denies further evidence of bleeding. Denies abdominal pain.     Objective:     VITALS:   Last 24hrs VS reviewed since prior progress note. Most recent are:  Vitals:    09/28/22 1450   BP: 90/61   Pulse: 84   Resp: 27   Temp: 97.6 F (36.4 C)   SpO2: 92%       PHYSICAL EXAM:  General: Cooperative, no acute distress    Neurologic:  Alert and oriented X 3.  HEENT: EOMI, no scleral icterus   Lungs:  CTA bilaterally. No wheezing  Heart:  S1 S2, regular rhythm  Abdomen: Soft, non-distended, no tenderness. +Bowel sounds  Extremities: No edema  Psych:   Good insight. Not anxious or agitated.    Lab Data Reviewed:     Recent Results (from the past 24 hour(s))   POCT Glucose    Collection Time: 09/27/22  5:10 PM   Result Value Ref Range    POC Glucose 67 65 - 117 mg/dL    Performed by: Braulio Conte   PCT    Lactic Acid    Collection Time: 09/27/22  5:51 PM   Result Value Ref Range    Lactic Acid, Plasma 1.4 0.4 - 2.0 MMOL/L   Protime-INR    Collection Time: 09/27/22  5:51 PM   Result Value Ref Range    INR 1.9 (H) 0.9 - 1.1      Protime 19.0 (H) 9.0 - 11.1 sec   POCT Glucose    Collection Time: 09/27/22  8:45 PM   Result Value Ref Range    POC Glucose 78 65 - 117 mg/dL    Performed by: Jeralyn Bennett CON    Basic Metabolic Panel    Collection Time: 09/28/22  4:15 AM   Result Value Ref Range    Sodium 136 136 - 145 mmol/L    Potassium 3.6 3.5 - 5.1 mmol/L    Chloride 104 97 - 108 mmol/L    CO2 26 21 - 32 mmol/L    Anion Gap 6 5 - 15 mmol/L    Glucose 102  (H) 65 - 100 mg/dL    BUN 26 (H) 6 - 20 MG/DL    Creatinine 2.31 (H) 0.70 - 1.30 MG/DL    Bun/Cre Ratio 11 (L) 12 - 20      Est, Glom Filt Rate 29 (L) >60 ml/min/1.35m2    Calcium 8.5 8.5 - 10.1 MG/DL   Phosphorus    Collection Time: 09/28/22  4:15 AM   Result Value Ref Range    Phosphorus 1.0 (L) 2.6 - 4.7 MG/DL   POCT Glucose    Collection Time: 09/28/22  6:16 AM   Result Value Ref Range    POC Glucose 105 65 - 117 mg/dL    Performed by: Jeralyn Bennett CON    CBC with Auto Differential  Collection Time: 09/28/22  7:27 AM   Result Value Ref Range    WBC 15.5 (H) 4.1 - 11.1 K/uL    RBC 3.87 (L) 4.10 - 5.70 M/uL    Hemoglobin 11.4 (L) 12.1 - 17.0 g/dL    Hematocrit 02.4 (L) 36.6 - 50.3 %    MCV 86.3 80.0 - 99.0 FL    MCH 29.5 26.0 - 34.0 PG    MCHC 34.1 30.0 - 36.5 g/dL    RDW 09.7 (H) 35.3 - 14.5 %    Platelets 108 (L) 150 - 400 K/uL    MPV 11.2 8.9 - 12.9 FL    Nucleated RBCs 0.2 (H) 0 PER 100 WBC    nRBC 0.03 (H) 0.00 - 0.01 K/uL    Neutrophils % 82 (H) 32 - 75 %    Lymphocytes % 13 12 - 49 %    Monocytes % 4 (L) 5 - 13 %    Eosinophils % 0 0 - 7 %    Basophils % 0 0 - 1 %    Immature Granulocytes 1 (H) 0.0 - 0.5 %    Neutrophils Absolute 12.6 (H) 1.8 - 8.0 K/UL    Lymphocytes Absolute 2.0 0.8 - 3.5 K/UL    Monocytes Absolute 0.6 0.0 - 1.0 K/UL    Eosinophils Absolute 0.0 0.0 - 0.4 K/UL    Basophils Absolute 0.1 0.0 - 0.1 K/UL    Absolute Immature Granulocyte 0.2 (H) 0.00 - 0.04 K/UL    Differential Type AUTOMATED     POCT Glucose    Collection Time: 09/28/22 12:05 PM   Result Value Ref Range    POC Glucose 89 65 - 117 mg/dL    Performed by: Olegario Messier  PCT    Procalcitonin    Collection Time: 09/28/22 12:53 PM   Result Value Ref Range    Procalcitonin 0.62 ng/mL       CT ABD/PELVIS 11/25 FINDINGS:   LOWER THORAX: Bilateral mild sized pleural effusion with adjacent atelectasis.  Small nodular infiltrate pleural-based at the left lung base anteriorly.     LIVER: No mass.  BILIARY TREE: Gallbladder is within  normal limits. CBD is not dilated.  SPLEEN: within normal limits.  PANCREAS: No mass or ductal dilatation.  ADRENALS: Unremarkable.  KIDNEYS: No mass, calculus, or hydronephrosis.  STOMACH: Unremarkable.  SMALL BOWEL: No dilatation or wall thickening.  COLON: Fecal stasis and diverticulosis. There is some mild enhancement in the colonic wall of the proximal descending and in the rectal area possibly related to inflammatory changes. No definite evidence of intraluminal extravasation of contrast by this technique.     PERITONEUM: No ascites or pneumoperitoneum.  RETROPERITONEUM: No lymphadenopathy or aortic aneurysm.     URINARY BLADDER: No mass or calculus.  BONES: No destructive bone lesion.  ABDOMINAL WALL: No mass or hernia.  ADDITIONAL COMMENTS: N/A     IMPRESSION:  No definite extravasation of contrast. As above.    CT ABD/PELVIS 11/23 IMPRESSION:     1. No CT evidence of acute GI bleed.  2. Rectal wall thickening and mucosal enhancement suggests proctitis. Large  amount stool in the colon with no bowel obstruction.  3. No definite change in left hepatic fluid collection and adjacent ill-defined  stranding.  4. Heterogeneous low density in the pancreatic head and adjacent pancreatic  stranding may be unchanged to prior study performed without IV contrast.  Correlate for clinical signs of acute pancreatitis. Consider follow-up contrast  enhanced pancreas CT  to document resolution..  5. Bibasilar atelectasis and pleural effusions     Assessment:     Distal colonic wall thickening: Seen on CT as above, suspicious for proctitis. WBC 15.5. Hgb 11.4. Can consider colonoscopy once Bps more stable - no need for urgent colonoscopy. On antibiotics. On PPI.  Anemia: Hgb 11.4. Will monitor  Hypotension: on midodrine      Patient Active Problem List   Diagnosis    Gastric perforation (HCC)    Type 2 diabetes mellitus with hyperglycemia, without long-term current use of insulin (HCC)    Intestinal obstruction (HCC)    Severe  sepsis (HCC)    Septic shock (HCC)    E coli infection    Liver abscess    Pneumoperitoneum    Acute respiratory failure with hypoxia (HCC)    AKI (acute kidney injury) (HCC)    Multi-organ failure with heart failure (HCC)    Thrombocytopenia (HCC)    Bacteremia due to Escherichia coli    Hepatic abscess    Peripheral arterial disease (HCC)    Hyperbilirubinemia    Gram negative sepsis (HCC)    Septicemia (HCC)    Aspiration pneumonia of both lower lobes (HCC)    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 Janetta Hora     Signed By: Reymundo Poll, PA     09/28/2022  3:21 PM       Agree with above    Will follow

## 2022-09-28 NOTE — Plan of Care (Signed)
Problem: Discharge Planning  Goal: Discharge to home or other facility with appropriate resources  09/28/2022 0335 by Naaman Plummer Shelton Soler, RN  Outcome: Progressing  09/28/2022 0100 by Naaman Plummer Ameena Vesey, RN  Outcome: Progressing  Flowsheets (Taken 09/27/2022 1115 by Rae Mar, RN)  Discharge to home or other facility with appropriate resources:   Identify barriers to discharge with patient and caregiver   Arrange for needed discharge resources and transportation as appropriate   Identify discharge learning needs (meds, wound care, etc)     Problem: Skin/Tissue Integrity  Goal: Absence of new skin breakdown  Description: 1.  Monitor for areas of redness and/or skin breakdown  2.  Assess vascular access sites hourly  3.  Every 4-6 hours minimum:  Change oxygen saturation probe site  4.  Every 4-6 hours:  If on nasal continuous positive airway pressure, respiratory therapy assess nares and determine need for appliance change or resting period.  09/28/2022 0335 by Darlys Gales, RN  Outcome: Progressing  09/28/2022 0100 by Naaman Plummer Evian Salguero, RN  Outcome: Progressing     Problem: Safety - Adult  Goal: Free from fall injury  09/28/2022 0335 by Naaman Plummer, Keller Bounds, RN  Outcome: Progressing  09/28/2022 0100 by Naaman Plummer, Jessiah Steinhart, RN  Outcome: Progressing     Problem: Chronic Conditions and Co-morbidities  Goal: Patient's chronic conditions and co-morbidity symptoms are monitored and maintained or improved  09/28/2022 0335 by Naaman Plummer, Jedidiah Demartini, RN  Outcome: Progressing  09/28/2022 0100 by Naaman Plummer Nyah Shepherd, RN  Outcome: Progressing  Flowsheets (Taken 09/27/2022 1115 by Rae Mar, RN)  Care Plan - Patient's Chronic Conditions and Co-Morbidity Symptoms are Monitored and Maintained or Improved: Monitor and assess patient's chronic conditions and comorbid symptoms for stability, deterioration, or improvement     Problem: Pain  Goal: Verbalizes/displays adequate comfort level or baseline comfort level  09/28/2022 0335 by Naaman Plummer, Benjamine Strout, RN  Outcome: Progressing  09/28/2022  0100 by Naaman Plummer, Ringo Sherod, RN  Outcome: Progressing  Flowsheets (Taken 09/27/2022 1115 by Rae Mar, RN)  Verbalizes/displays adequate comfort level or baseline comfort level:   Encourage patient to monitor pain and request assistance   Assess pain using appropriate pain scale   Administer analgesics based on type and severity of pain and evaluate response     Problem: Occupational Therapy - Adult  Goal: By Discharge: Performs self-care activities at highest level of function for planned discharge setting.  See evaluation for individualized goals.  Description: FUNCTIONAL STATUS PRIOR TO ADMISSION:  Patient was independent prior to the end of July when he was admitted to [facility] with a perforated bowel. Had two month admission with multiple complications including sacral wounds and bilateral transmetatarsal amputations, + new HD dependency. Discharged to [facility] for rehab where he has been for two months. States he is hoyer lifted to w/c but is not yet standing or ambulating. Of note, he was heel WB bilaterally for transfers only during his [facility] admission (unclear if his precautions remain at this time).     HOME SUPPORT PRIOR TO ADMISSION: The patient lived with his wife in [place], [place] prior to admission to [facility] (was in town for the [place]). Residing at [facility] prior to this admission    Receives Help From: Other (comment) (staff at rehab facility), ADL Assistance: Needs assistance,  ,  ,  ,  ,  ,  , Ambulation Assistance: Non-ambulatory, Transfer Assistance: Needs assistance (hoyert lift at rehab),       Occupational Therapy Goals:  Initiated 09/27/2022  1.  Patient will perform grooming  with Moderate Assist within 7 day(s).  2.  Patient will perform upper body dressing with Moderate Assist within 7 day(s).  3.  Patient will perform self-feeding with Stand by Assist within 7 day(s).  4.  Patient will perform roll from L<>R with mod A x 2 within 7 day(s).  5.  Patient will participate in upper extremity  therapeutic exercise/activities with Moderate Assist for 10 minutes within 7 day(s).    6.  Patient will utilize energy conservation techniques during functional activities with verbal and visual cues within 7 day(s).    09/27/2022 1425 by Jason Nest, OT  Outcome: Progressing

## 2022-09-28 NOTE — Plan of Care (Signed)
Problem: Discharge Planning  Goal: Discharge to home or other facility with appropriate resources  Outcome: Progressing  Flowsheets (Taken 09/27/2022 1115 by Rae Mar, RN)  Discharge to home or other facility with appropriate resources:   Identify barriers to discharge with patient and caregiver   Arrange for needed discharge resources and transportation as appropriate   Identify discharge learning needs (meds, wound care, etc)     Problem: Skin/Tissue Integrity  Goal: Absence of new skin breakdown  Description: 1.  Monitor for areas of redness and/or skin breakdown  2.  Assess vascular access sites hourly  3.  Every 4-6 hours minimum:  Change oxygen saturation probe site  4.  Every 4-6 hours:  If on nasal continuous positive airway pressure, respiratory therapy assess nares and determine need for appliance change or resting period.  Outcome: Progressing     Problem: Safety - Adult  Goal: Free from fall injury  Outcome: Progressing     Problem: Chronic Conditions and Co-morbidities  Goal: Patient's chronic conditions and co-morbidity symptoms are monitored and maintained or improved  Outcome: Progressing  Flowsheets (Taken 09/27/2022 1115 by Rae Mar, RN)  Care Plan - Patient's Chronic Conditions and Co-Morbidity Symptoms are Monitored and Maintained or Improved: Monitor and assess patient's chronic conditions and comorbid symptoms for stability, deterioration, or improvement     Problem: Pain  Goal: Verbalizes/displays adequate comfort level or baseline comfort level  Outcome: Progressing  Flowsheets (Taken 09/27/2022 1115 by Rae Mar, RN)  Verbalizes/displays adequate comfort level or baseline comfort level:   Encourage patient to monitor pain and request assistance   Assess pain using appropriate pain scale   Administer analgesics based on type and severity of pain and evaluate response     Problem: Physical Therapy - Adult  Goal: By Discharge: Performs mobility at highest level of  function for planned discharge setting.  See evaluation for individualized goals.  Description: FUNCTIONAL STATUS PRIOR TO ADMISSION: Patient was independent prior to the end of July when he was admitted to [facility] with a perforated bowel. Had two month admission with multiple complications including sacral wounds and bilateral transmetatarsal amputations, + new HD dependency. Discharged to [facility] for rehab where he has been for two months. States he is hoyer lifted to w/c but is not yet standing or ambulating. Of note, he was heel WB bilaterally for transfers only during his [facility] admission (unclear if his precautions remain at this time).     HOME SUPPORT PRIOR TO ADMISSION: The patient lived with his wife in [place], [place] prior to admission to [facility] (was in town for the [place]).    Physical Therapy Goals  Initiated 09/27/2022  1.  Patient will move from supine to sit and sit to supine, scoot up and down, and roll side to side in bed with moderate assistance within 7 day(s).    2.  Patient will sit EOB x5 min with moderate assist x2 within 7 days.   3.  Patient will participate in supine HEP with minimal assistance within 7 days.   4.  Patient will tolerate OOB to chair via hoyer lift x60 min within 7 days.     09/27/2022 1249 by Lebron Conners, PT  Outcome: Progressing     Problem: Occupational Therapy - Adult  Goal: By Discharge: Performs self-care activities at highest level of function for planned discharge setting.  See evaluation for individualized goals.  Description: FUNCTIONAL STATUS PRIOR TO ADMISSION:  Patient was independent prior to the end  of July when he was admitted to [name] with a perforated bowel. Had two month admission with multiple complications including sacral wounds and bilateral transmetatarsal amputations, + new HD dependency. Discharged to [facility] for rehab where he has been for two months. States he is hoyer lifted to w/c but is not yet standing or ambulating. Of note, he was  heel WB bilaterally for transfers only during his [facility] admission (unclear if his precautions remain at this time).     HOME SUPPORT PRIOR TO ADMISSION: The patient lived with his wife in [place], [place] prior to admission to [facility] (was in town for [employer]). Residing at [facility] prior to this admission    Receives Help From: Other (comment) (staff at rehab facility), ADL Assistance: Needs assistance,  ,  ,  ,  ,  ,  , Ambulation Assistance: Non-ambulatory, Transfer Assistance: Needs assistance (hoyert lift at rehab),       Occupational Therapy Goals:  Initiated 09/27/2022  1.  Patient will perform grooming with Moderate Assist within 7 day(s).  2.  Patient will perform upper body dressing with Moderate Assist within 7 day(s).  3.  Patient will perform self-feeding with Stand by Assist within 7 day(s).  4.  Patient will perform roll from L<>R with mod A x 2 within 7 day(s).  5.  Patient will participate in upper extremity therapeutic exercise/activities with Moderate Assist for 10 minutes within 7 day(s).    6.  Patient will utilize energy conservation techniques during functional activities with verbal and visual cues within 7 day(s).    09/27/2022 1425 by Jason Nest, OT  Outcome: Progressing  09/27/2022 1254 by Jason Nest, OT  Outcome: Progressing

## 2022-09-28 NOTE — Progress Notes (Signed)
[name]                                                                                          Hospitalist Progress Note  Penelope Galas, MD  Answering service: [phone] OR [phone] from in house phone        Date of Service:  09/28/2022  NAME:  Jaime [name]  DOB:  08/12/1946  MRN:  [id number]       Admission Summary:   Per H&P: Jaime [name] is a 76 y.o. male with hx of dm ii, htn, esrd on hd, pvd s/p bilateral TMA, hx of shock 2/2 perforated viscus, liver abscess s/p ir drainage, sacral ulcer who presented to ed with complaints of rectal bleeding.  Patient states he had single episode of rectal bleeding which prompted him to seek ED care.  He denies any previous history of hemorrhoids.  Diverticulosis  The patient denies any fever, chills, chest or abdominal pain, nausea, vomiting, cough, congestion, recent illness, palpitations, or dysuria.     Remarkable vitals on ER Presentation: vss  Labs Remarkable for: wbc 17.6, alb 1.8, cr 2.23  ER Images: ct abd et pelvis:  Rectal wall thickening and mucosal enhancement suggests proctitis. Large  amount stool in the colon with no bowel obstruction.  3. No definite change in left hepatic fluid collection and adjacent ill-defined  stranding.  4. Heterogeneous low density in the pancreatic head and adjacent pancreatic  stranding may be unchanged to prior study performed without IV contrast.  Correlate for clinical signs of acute pancreatitis. Consider follow-up contrast  enhanced pancreas CT to document resolution..  5. Bibasilar atelectasis and pleural effusions  ER Rx: 1l ns bolus, levaquin and flagyl     Interval history / Subjective:      Patient seen and examined, lying in bed. Patient alert and oriented X 4.     BP in 70s this am, IV albumin ordered and BP improved  Seen at HD  Tolerating, no volume pulled today     Assessment & Plan:     Acute proctitis  Constipation, resolved  Rectal Bleed, resolving  -abd/pel  CT: rectal wall thickening and mucosal enhancement suggests proctitis, large amount stool in colon with no bowel obstruction  -lipase 14  -GI following: no need for urgent colonoscopy  -RRT 11/25 for hypotension and bright red blood per rectum, Hgb 8.9 - 1 u PRBC ordered in setting of active bleeding  -abd/pel CTA (11/25): no definite extravasation of contrast  -monitor color and quantity of stools - 2 dark brown BM on 11/26, no further bright red bleeding  -monitor Hgb, transfuse if <7 or active bleeding occurs  -continue BID pantoprazole  -continue levofloxacin and metronidazole -- change metronidazole to PO to decrease fluid intake  -regular diet     Hypotension  -SBP 60s-90s  -evaluated by ICU today - determined to not have ICU needs at this time  -continue scheduled midodrine, on 15MG  TID  -iv Albumin X 1 give, BP improved   -monitor vitals    Hypoxia  Possible PNA  -on 2 L NC with  O2 sat 95-97%  -CXR (11/25): bibasilar airspace opacities concerning for infection  -add vancomycin, check MRSA swab  -WBC down trending - 19.2 --> 16.7; patient afebrile  -wean oxygen as able to maintain O2 sat >92%    ESRD on HD MWF  -Nephrology following  -continue home midodrine and renvela    Hypoglycemia, resolved  -discontinue D5W in setting of fluid overload  -monitor glucose levels  -hypoglycemia protocol in place    Mood Disorder  -continue home mirtazapine    PVD s/p toe amputation left foot  Chronic sacral wounds  Left heel ulcer  -wound care following     Code status: Full  Prophylaxis: SCDs  Care Plan discussed with: patient, patient's wife, RN, ICU  Anticipated Disposition: TBD  Inpatient  Cardiac monitoring: Telemetry           Review of Systems:   Fatigue  Feels better than yesterday      Vital Signs:    Last 24hrs VS reviewed since prior progress note. Most recent are:  Vitals:    09/28/22 1450   BP: 90/61   Pulse: 84   Resp: 27   Temp: 97.6 F (36.4 C)   SpO2: 92%         Intake/Output Summary (Last 24 hours) at  09/28/2022 1536  Last data filed at 09/28/2022 1230  Gross per 24 hour   Intake 500 ml   Output 300 ml   Net 200 ml          Physical Examination:     I had a face to face encounter with this patient and independently examined them on 09/28/2022 as outlined below:          General : alert, oriented X4, awake, no acute distress  Chest: Clear to auscultation bilaterally, on 2 L NC  CVS: S1 S2 heard, Capillary refill less than 2 seconds  Abd: soft/non tender, non distended, hypoactive bowel sounds  Ext: no clubbing, no cyanosis, 1+ lower extremity edema  Neuro/Psych: pleasant mood and affect, CN 2-12 grossly intact, s  Skin: warm, pale, mottling noted to upper extremities and upper thighs    Data Review:    Review and/or order of clinical lab test      I have personally and independently reviewed all pertinent labs, diagnostic studies, imaging, and have provided independent interpretation of the same.     Labs:     Recent Labs     09/27/22  0315 09/28/22  0727   WBC 16.7* 15.5*   HGB 10.3* 11.4*   HCT 30.6* 33.4*   PLT 92* 108*       Recent Labs     09/26/22  0920 09/27/22  0315 09/28/22  0415   NA 137 134* 136   K 3.6 4.4 3.6   CL 103 103 104   CO2 30 27 26    BUN 27* 27* 26*   PHOS  --   --  1.0*       Recent Labs     09/26/22  0920 09/27/22  0315   ALT 18 15   GLOB 3.1 3.1       Recent Labs     09/27/22  1751   INR 1.9*      No results for input(s): "TIBC", "FERR" in the last 72 hours.    Invalid input(s): "FE", "PSAT"   No results found for: "FOL", "RBCF"   No results for input(s): "PH", "PCO2", "PO2" in the last 72  hours.  No results for input(s): "CPK" in the last 72 hours.    Invalid input(s): "CPKMB", "CKNDX", "TROIQ"  No results found for: "CHOL", "Burt", "CHLST", "CHOLV", "HDL", "HDLC", "LDL", "LDLC", "TGLX", "TRIGL"  No results found for: "GLUCPOC"    Notes reviewed from all clinical/nonclinical/nursing services involved in patient's clinical care. Care coordination discussions were held with appropriate  clinical/nonclinical/ nursing providers based on care coordination needs.       Patients current active Medications were reviewed, considered, added and adjusted based on the clinical condition today.      Home Medications were reconciled to the best of my ability given all available resources at the time of admission. Route is PO if not otherwise noted.    Medications Reviewed:     Current Facility-Administered Medications   Medication Dose Route Frequency    dextrose 10 % infusion   IntraVENous Continuous    metroNIDAZOLE (FLAGYL) tablet 500 mg  500 mg Oral 3 times per day    Vancomycin - Pharmacy to Dose (HD Patient)   Other RX Placeholder    midodrine (PROAMATINE) tablet 15 mg  15 mg Oral TID    pantoprazole (PROTONIX) 40 mg in sodium chloride (PF) 0.9 % 10 mL injection  40 mg IntraVENous Q12H    glucose chewable tablet 16 g  4 tablet Oral PRN    dextrose bolus 10% 125 mL  125 mL IntraVENous PRN    Or    dextrose bolus 10% 250 mL  250 mL IntraVENous PRN    glucagon injection 1 mg  1 mg SubCUTAneous PRN    dextrose 10 % infusion   IntraVENous Continuous PRN    heparin (porcine) 1000 UNIT/ML injection 1,800 Units  1,800 Units IntraCATHeter PRN    And    heparin (porcine) 1000 UNIT/ML injection 1,800 Units  1,800 Units IntraCATHeter PRN    levoFLOXacin (LEVAQUIN) 500 MG/100ML infusion 500 mg  500 mg IntraVENous Q48H    albumin human 25% IV solution 25 g  25 g IntraVENous PRN    polyethylene glycol (GLYCOLAX) packet 17 g  17 g Oral Daily    mirtazapine (REMERON) tablet 7.5 mg  7.5 mg Oral Nightly    sevelamer (RENVELA) packet 2.4 g  2.4 g Oral TID WC    sodium chloride flush 0.9 % injection 5-40 mL  5-40 mL IntraVENous 2 times per day    sodium chloride flush 0.9 % injection 5-40 mL  5-40 mL IntraVENous PRN    0.9 % sodium chloride infusion   IntraVENous PRN    ondansetron (ZOFRAN-ODT) disintegrating tablet 4 mg  4 mg Oral Q8H PRN    Or    ondansetron (ZOFRAN) injection 4 mg  4 mg IntraVENous Q6H PRN    polyethylene  glycol (GLYCOLAX) packet 17 g  17 g Oral Daily PRN    acetaminophen (TYLENOL) tablet 650 mg  650 mg Oral Q6H PRN    Or    acetaminophen (TYLENOL) suppository 650 mg  650 mg Rectal Q6H PRN     ______________________________________________________________________  EXPECTED LENGTH OF STAY: Unable to retrieve estimated LOS  ACTUAL LENGTH OF STAY:          [place], MD

## 2022-09-28 NOTE — Progress Notes (Signed)
[facility]   [address] [place], [place] [address]  Phone: [phone]   [phone]    [email or web address]     Nephrology Progress Note    Patient Name : Jaime [name]     DOB : February 08, 1946     MRN : [id number]  Date of Admission : 10/19/2022  Date of Servive : 09/28/22    CC:  Follow up for ESRD       Assessment and Plan   ESRD-HD  -Dialyzes MWF at [facility]  -Access: [facility] PC: Placed 07/01/2022  -HD today - no UF  -Albumin ordered earlier per primary team    Acute proctitis:  Rectal bleed  -Per primary team    MBD of CKD  - sCa stable will request Phos level  - on Renvela  - will check ipth    Hypoglycemia:  -On D5W at 50 cc/h    Anemia in CKD:  - Hgb stable 11.4  - CTA (-) for active bleed  - GI following    Chronic hypotension:  -Continue midodrine  -IV albumin PRN    PVD: S/p B/L TMA  DM2  Hx of pancreatitis  Hx of liver abscess s/p drainage  Sacral ulcer     Interval History:  Seen and examined on dialysis. Bp stable during rounds- Receiving IV albumin. He has minimal SOB on 3 L NC. No UOP documented.    Review of Systems: A comprehensive review of systems was negative.    Current Medications:   Current Facility-Administered Medications   Medication Dose Route Frequency    albumin human 25% IV solution 50 g  50 g IntraVENous Once    dextrose 10 % infusion   IntraVENous Continuous    metroNIDAZOLE (FLAGYL) tablet 500 mg  500 mg Oral 3 times per day    Vancomycin - Pharmacy to Dose (HD Patient)   Other RX Placeholder    midodrine (PROAMATINE) tablet 15 mg  15 mg Oral TID    pantoprazole (PROTONIX) 40 mg in sodium chloride (PF) 0.9 % 10 mL injection  40 mg IntraVENous Q12H    glucose chewable tablet 16 g  4 tablet Oral PRN    dextrose bolus 10% 125 mL  125 mL IntraVENous PRN    Or    dextrose bolus 10% 250 mL  250 mL IntraVENous PRN    glucagon injection 1 mg  1 mg SubCUTAneous PRN    dextrose 10 % infusion   IntraVENous Continuous PRN    heparin (porcine)  1000 UNIT/ML injection 1,800 Units  1,800 Units IntraCATHeter PRN    And    heparin (porcine) 1000 UNIT/ML injection 1,800 Units  1,800 Units IntraCATHeter PRN    levoFLOXacin (LEVAQUIN) 500 MG/100ML infusion 500 mg  500 mg IntraVENous Q48H    albumin human 25% IV solution 25 g  25 g IntraVENous PRN    polyethylene glycol (GLYCOLAX) packet 17 g  17 g Oral Daily    mirtazapine (REMERON) tablet 7.5 mg  7.5 mg Oral Nightly    sevelamer (RENVELA) packet 2.4 g  2.4 g Oral TID WC    sodium chloride flush 0.9 % injection 5-40 mL  5-40 mL IntraVENous 2 times per day    sodium chloride flush 0.9 % injection 5-40 mL  5-40 mL IntraVENous PRN    0.9 % sodium chloride infusion   IntraVENous PRN    ondansetron (ZOFRAN-ODT) disintegrating tablet 4 mg  4 mg Oral Q8H PRN    Or    ondansetron (ZOFRAN) injection 4 mg  4 mg IntraVENous Q6H PRN    polyethylene glycol (GLYCOLAX) packet 17 g  17 g Oral Daily PRN    acetaminophen (TYLENOL) tablet 650 mg  650 mg Oral Q6H PRN    Or    acetaminophen (TYLENOL) suppository 650 mg  650 mg Rectal Q6H PRN      Allergies   Allergen Reactions    Augmentin [Amoxicillin-Pot Clavulanate] Hives     Tolerated ceftriaxone 06/2022    Codeine Itching     Unknown reaction    Oxycodone Itching       Objective:  Vitals:    Vitals:    09/28/22 0330 09/28/22 0332 09/28/22 0355 09/28/22 0655   BP: (!) 92/53 (!) 99/54  (!) 77/61   Pulse: 79 82 78 79   Resp: 17 25  17    Temp:  98.1 F (36.7 C)  98.1 F (36.7 C)   TempSrc:  Axillary  Axillary   SpO2: 97% 98%  97%   Weight:       Height:         Intake and Output:  No intake/output data recorded.  11/25 1901 - 11/27 0700  In: 500   Out: -     Physical Examination:  General: ill looking  Neck:  Supple, no mass  Resp:  Decreased BS no wheezing , normal respiratory effort  CV:  RRR,  no murmur or rub, LE edema  GI:  Soft, NT, + BS, no HS megaly  Neurologic:  Non focal  Dialysis Access : [facility] PC    []     High complexity decision making was performed  []     Patient is at  high-risk of decompensation with multiple organ involvement    Lab Data Personally Reviewed: I have reviewed all the pertinent labs, microbiology data and radiology studies during assessment.    Labs:  Recent Labs     09/26/22  0920 09/26/22  0931 09/27/22  0315 09/28/22  0415   NA 137  --  134* 136   K 3.6  --  4.4 3.6   CL 103  --  103 104   CO2 30  --  27 26   GLUCOSE 81  --  96 102*   BUN 27*  --  27* 26*   CREATININE 1.90* 1.8* 2.09* 2.31*   CALCIUM 8.3*  --  8.3* 8.5         Recent Labs     09/26/22  0920 09/26/22  1731 09/27/22  0315 09/28/22  0727   WBC 19.2*  --  16.7* 15.5*   RBC 3.07*  --  3.54* 3.87*   HGB 8.9* 10.3* 10.3* 11.4*   HCT 26.6* 30.7* 30.6* 33.4*   MCV 86.6  --  86.4 86.3   MCH 29.0  --  29.1 29.5   MCHC 33.5  --  33.7 34.1   RDW 19.4*  --  19.2* 18.9*   PLT 87*  --  92* 108*   MPV 10.5  --  11.5 11.2       Recent Labs     09/26/22  0920 09/27/22  0315   GLOB 3.1 3.1       Recent Labs     09/27/22  1751   INR 1.9*      No results for input(s): "CPK", "CKMB", "TROPONINI" in the last 72 hours.  Invalid input(s): "B-NP"  Invalid input(s): "PHI", "PCO2I", "PO2I", "FIO2I"     Ventilator:       Microbiology:  No results found for: "SDES"  No components found for: "CULT"      I have reviewed the flowsheets.  Chart and Pertinent Notes have been reviewed.   No change in PMH ,family and social history from Consult note.      Maurine Cane, APRN - NP  [facility]

## 2022-09-28 NOTE — Other (Incomplete)
PRE HD: 09/28/22 0920   Vital Signs   BP (!) 103/55   Temp 98 F (36.7 C)   Pulse 79   Respirations 27   SpO2 99 %   Pain Assessment   Pain Assessment 0-10   Pain Level 3   Pain Type Chronic pain   Observations & Evaluations   Level of Consciousness 0   Oriented X 4   Heart Rhythm Regular   Respiratory Quality/Effort Dyspnea with exertion   O2 Device Nasal cannula   Bilateral Breath Sounds Diminished   Skin Color Pale   Skin Condition/Temp Cool;Dry;Swollen/edematous   Abdomen Inspection Flat;Soft   Edema Generalized   RUE Edema +2   LUE Edema +2   Technical Checks   Dialysis Machine No. [id number]   RO Machine Number [id number]   Dialyzer Lot No. [id number]   Tubing Lot Number [id number]   All Connections Secure Yes   NS Bag Yes   Saline Line Double Clamped Yes   Dialyzer Revaclear 300   Prime Volume (mL) 200 mL   ICEBOAT I;C;E;B;O;A;T   RO Machine Log Sheet Completed Yes   Machine Alarm Self Test Completed;Passed   Materials engineer Function   Extracorporeal Advertising copywriter Conductivity 13.8   Manual Conductivity 13.8   Manual Ph 7.4   Bleach Test (Neg) Yes   Bath Temperature 96.8 F (36 C)   Treatment Initiation   Dialyze Hours 3   Treatment  Initiation Universal Precautions maintained;Connections secured;Prime given;Venous Parameters set;Arterial Parameters set;Air foam detector engaged;Dialysate;Saline line double clamped;Revaclear Dialyzer   Access   Add Access? Yes   Dialysis Bath   K+ (Potassium) 3   Ca+ (Calcium) 2.5   Na+ (Sodium) 138   HCO3 (Bicarb) 38   Bicarbonate Concentrate Lot No. [id number]   Acid Concentrate Lot No. [id number]      09/28/22 0930   During Hemodialysis Assessment   BP 112/72   Pulse 75   Blood Flow Rate (ml/min) 400 ml/min   Arterial Pressure (mmHg) -140 mmHg   Venous Pressure (mmHg) 100   TMP 30   DFR 600   Comments HD initiated   Access Visible Yes   Ultrafiltration Rate (ml/hr) 170 ml/hr   Ultrafiltration Removed (mL) 0 ml/min   Tunneled  Hemodialysis Catheter Right Subclavian   Placement Date: 07/01/22   Present on Admission/Arrival: No  Inserted by: Hope Riddell-NP  Insertion Practices: Chlorohexadine skin antisepsis;Maximal barrier precautions;Sterile ultrasound technique;Optimal catheter site selection;Hand hygiene  Orien...   Access Status  Accessed   Continued need for line? Yes   Site Assessment Clean, dry & intact   Venous Lumen Status Blood return noted;Flushed;Infusing   Arterial Lumen Status Blood return noted;Flushed;Infusing   Line Care Ports disinfected;Connections checked and tightened   Dressing Type Sterile dressing, transparent;Bacteriocidal   Date of Last Dressing Change 09/25/22   Dressing Status Clean, dry & intact   Dressing Change Due 09/29/22   Primary RN SBAR: Laurence Compton, RN  Patient Education: CVC precautions  HBsAg: Neg (09/06/22) Obtained from [facility]  HBsAb: Sus (06/29/22) Obtained from Epic    POST HD: 09/28/22 1230   Observations & Evaluations   Level of Consciousness 0   Oriented X 4   Heart Rhythm Regular   Respiratory Quality/Effort Dyspnea with exertion  (cough)   O2 Device Nasal cannula   Bilateral Breath Sounds Diminished   Skin Condition/Temp Cool;Dry   Abdomen Inspection Flat;Soft   RUE Edema +2  LUE Edema +2   Vital Signs   BP 114/78   Pulse 78   Respirations 27   SpO2 95 %   Pain Assessment   Pain Assessment None - Denies Pain   Tunneled Hemodialysis Catheter Right Subclavian   Placement Date: 07/01/22   Present on Admission/Arrival: No  Inserted by: Hope Riddell-NP  Insertion Practices: Chlorohexadine skin antisepsis;Maximal barrier precautions;Sterile ultrasound technique;Optimal catheter site selection;Hand hygiene  Orien...   Continued need for line? Yes   Site Assessment Clean, dry & intact   Venous Lumen Status Flushed;Capped   Arterial Lumen Status Flushed;Capped   Line Care Ports disinfected;Cap changed   Dressing Type Sterile dressing, transparent;Bacteriocidal   Date of Last Dressing Change 09/25/22    Dressing Status Clean, dry & intact   Dressing Change Due 09/29/22   Post-Hemodialysis Assessment   Post-Treatment Procedures Catheter Capped, clamped with Saline x2 ports   Art therapist   Rinseback Volume (ml) 200 ml   Blood Volume Processed (Liters) 67 L   Dialyzer Clearance Lightly streaked   Duration of Treatment (minutes) 180 minutes   Hemodialysis Intake (ml) 500 ml   Hemodialysis Output (ml) 300 ml   NET Removed (ml) -200   Tolerated Treatment Fair   Interventions Taken Medication;Ultrafiltration stopped  (albumin and midodrine given to support BP)   Patient Response to Treatment treatment completed, UF limited d/t hypotension   Physician Notified Yes   Patient Disposition   (remains in current room/LOC)

## 2022-09-28 NOTE — Plan of Care (Signed)
Problem: Occupational Therapy - Adult  Goal: By Discharge: Performs self-care activities at highest level of function for planned discharge setting.  See evaluation for individualized goals.  Description: FUNCTIONAL STATUS PRIOR TO ADMISSION:  Patient was independent prior to the end of July when he was admitted to [facility] with a perforated bowel. Had two month admission with multiple complications including sacral wounds and bilateral transmetatarsal amputations, + new HD dependency. Discharged to [facility] for rehab where he has been for two months. States he is hoyer lifted to w/c but is not yet standing or ambulating. Of note, he was heel WB bilaterally for transfers only during his [facility] admission (unclear if his precautions remain at this time).     HOME SUPPORT PRIOR TO ADMISSION: The patient lived with his wife in [place], [place] prior to admission to [name] (was in town for the [place]). Residing at [place] prior to this admission    Receives Help From: Other (comment) (staff at rehab facility), ADL Assistance: Needs assistance,  ,  ,  ,  ,  ,  , Ambulation Assistance: Non-ambulatory, Transfer Assistance: Needs assistance (hoyert lift at rehab),       Occupational Therapy Goals:  Initiated 09/27/2022  1.  Patient will perform grooming with Moderate Assist within 7 day(s).  2.  Patient will perform upper body dressing with Moderate Assist within 7 day(s).  3.  Patient will perform self-feeding with Stand by Assist within 7 day(s).  4.  Patient will perform roll from L<>R with mod A x 2 within 7 day(s).  5.  Patient will participate in upper extremity therapeutic exercise/activities with Moderate Assist for 10 minutes within 7 day(s).    6.  Patient will utilize energy conservation techniques during functional activities with verbal and visual cues within 7 day(s).    Outcome: Progressing     OCCUPATIONAL THERAPY TREATMENT  Patient: Jaime [name] (76 y.o. male)  Date: 09/28/2022  Primary Diagnosis:  Hemorrhage of rectum and anus [K62.5]  Proctitis [K62.89]  Skin ulcer of sacrum, unspecified ulcer stage ([place]) [L98.429]       Precautions:  (sacral wounds)                Chart, occupational therapy assessment, plan of care, and goals were reviewed.    ASSESSMENT  Patient continues to benefit from skilled OT services and is progressing towards goals. Pt presents to OT services supine in bed with HOB elevated, amendable to session following verbal encouragement. VSS throughout session, on 2L O2 NC. Pt requiring overall MAX A x 2 for all bed mobility for trunk/LE management and sitting balance (noted posterior lean requiring initially continuous support progressing to SBA). Pt transferred to seated EOB and completed face washing with SBA. Provided verbal and demo education on UE AROM exercises to complete at bed level, pt verbally expressed udnerstanding. Pt returned to supine in bed, call bell within reach, all needs met. OT continues to recommend SNF following discharge - hopefully closer to family?     PLAN :  Patient continues to benefit from skilled intervention to address the above impairments.  Continue treatment per established plan of care to address goals.    Recommend with staff: HOB elevated during meals 3x/day, encourage participation in ADLs    Recommend next OT session: EOB ADLs    Recommendation for discharge: (in order for the patient to meet his/her long term goals): Therapy up to 5 days/week in Skilled nursing facility    Other factors to consider  for discharge: poor safety awareness, impaired cognition, high risk for falls, not safe to be alone, and concern for safely navigating or managing the home environment    IF patient discharges home will need the following DME: continuing to assess with progress       SUBJECTIVE:   Patient stated "I can try."    OBJECTIVE DATA SUMMARY:   Cognitive/Behavioral Status:     Cognition  Overall Cognitive Status: Exceptions  Arousal/Alertness: Delayed responses  to stimuli;Inconsistent responses to stimuli  Following Commands: Follows one step commands with increased time;Follows one step commands with repetition  Attention Span: Attends with cues to redirect;Difficulty attending to directions  Safety Judgement: Decreased awareness of need for assistance  Problem Solving: Assistance required to identify errors made;Assistance required to generate solutions  Insights: Decreased awareness of deficits  Initiation: Requires cues for some  Sequencing: Requires cues for some    Functional Mobility and Transfers for ADLs:  Bed Mobility:  Bed Mobility Training  Bed Mobility Training: Yes  Rolling: Maximum assistance;Assist X2  Supine to Sit: Maximum assistance;Assist X2  Sit to Supine: Maximum assistance;Assist X2     Transfers:   Transfer Training  Transfer Training: No           Balance:     Balance  Sitting: Impaired  Sitting - Static: Fair (occasional);Poor (constant support)  Sitting - Dynamic: Poor (constant support);Fair (occasional)      ADL Intervention:    Grooming: .  - Face Washing : Contact Guard Assistance and Seated EOB    Pain Rating:  Pt stated increased pain "all over", unable to quantify  Pain Intervention(s):   nursing notified, rest, and repositioning      Activity Tolerance:   Fair  and requires frequent rest breaks  Please refer to the flowsheet for vital signs taken during this treatment.    After treatment:   Patient left in no apparent distress in bed, Call bell within reach, and Side rails x3    COMMUNICATION/EDUCATION:   The patient's plan of care was discussed with: physical therapist and registered nurse    Patient Education  Education Given To: Patient  Education Provided: Plan of Care;Precautions;ADL Adaptive Strategies;Transfer Training  Education Method: Verbal  Barriers to Learning: Cognition  Education Outcome: Verbalized understanding;Continued education needed    Thank you for this referral.  Lynder Parents, OT  Minutes: 24

## 2022-09-28 NOTE — Care Coordination-Inpatient (Signed)
Care Management Initial Assessment       RUR: 16%  Readmission? No  1st IM letter given? No  1st [employer] letter given: No    The patient plans to return to SNF [facility] to resume rehab and Hemodialysis in house. CM called and spoke with admissions liaison, Jaime [name] [phone]) and she confirmed the patient is to return. Jaime [name] states they are working on a plan for the patient to eventually return to [place] when plan in place. CM called the patient's wife, Jaime [name] and she is in agreement along with patient to return to [place]. The patient was independent prior to hospitalization, drove a car, worked part time, no use or owns any DME, uses Product/process development scientist on [employer] in [place] and has a PCP named: Jaime [name] at the [facility] [phone]). BLS to transport when stable for discharge. No insurance auth needed with Medicare.       09/28/22 1439   Service Assessment   Patient Orientation Alert and Oriented   Cognition Alert   History Provided By Significant Other   Primary Caregiver Spouse   Accompanied By/Relationship Via phone: Spouse: [place] Spouse/Significant Other   Patient's Research scientist (physical sciences) is: Scientist, research (physical sciences) Next of Kin   PCP Verified by CM Yes   Last Visit to PCP Within last 6 months   Prior Functional Level Independent in ADLs/IADLs   Current Functional Level Assistance with the following:;Mobility  (Hemodialysis)   Can patient return to prior living arrangement Other (see comment)  (Plan is to return to [facility])   Ability to make needs known: Good   Family able to assist with home care needs: Yes   Would you like for me to discuss the discharge plan with any other family members/significant others, and if so, who? Yes  (Spouse)   Condition of Participation: Discharge Planning   The Patient and/or Patient Representative was provided with a Choice of Provider? Patient;Patient Representative   The Patient and/Or Patient  Representative agree with the Discharge Plan? Yes   Freedom of Choice list was provided with basic dialogue that supports the patient's individualized plan of care/goals, treatment preferences, and shares the quality data associated with the providers?  Yes     Jaime Millman RN/CRM  [phone]

## 2022-09-29 ENCOUNTER — Inpatient Hospital Stay: Admit: 2022-09-29 | Payer: MEDICARE

## 2022-09-29 LAB — CBC WITH AUTO DIFFERENTIAL
Absolute Immature Granulocyte: 0.1 10*3/uL — ABNORMAL HIGH (ref 0.00–0.04)
Basophils %: 0 % (ref 0–1)
Basophils %: 0 % (ref 0–1)
Basophils Absolute: 0 10*3/uL (ref 0.0–0.1)
Basophils Absolute: 0 10*3/uL (ref 0.0–0.1)
Eosinophils %: 0 % (ref 0–7)
Eosinophils %: 0 % (ref 0–7)
Eosinophils Absolute: 0 10*3/uL (ref 0.0–0.4)
Eosinophils Absolute: 0 10*3/uL (ref 0.0–0.4)
Hematocrit: 24.4 % — ABNORMAL LOW (ref 36.6–50.3)
Hematocrit: 29.7 % — ABNORMAL LOW (ref 36.6–50.3)
Hemoglobin: 8.3 g/dL — ABNORMAL LOW (ref 12.1–17.0)
Hemoglobin: 10 g/dL — ABNORMAL LOW (ref 12.1–17.0)
Immature Granulocytes %: 1 % — ABNORMAL HIGH (ref 0.0–0.5)
Immature Granulocytes %: 1 % — ABNORMAL HIGH (ref 0.0–0.5)
Immature Granulocytes Absolute: 0.1 10*3/uL — ABNORMAL HIGH (ref 0.00–0.04)
Lymphocytes %: 23 % (ref 12–49)
Lymphocytes %: 19 % (ref 12–49)
Lymphocytes Absolute: 1.4 10*3/uL (ref 0.8–3.5)
Lymphocytes Absolute: 1.8 10*3/uL (ref 0.8–3.5)
MCH: 28.9 PG (ref 26.0–34.0)
MCH: 28.7 PG (ref 26.0–34.0)
MCHC: 34 g/dL (ref 30.0–36.5)
MCHC: 33.7 g/dL (ref 30.0–36.5)
MCV: 85 FL (ref 80.0–99.0)
MCV: 85.1 FL (ref 80.0–99.0)
Monocytes %: 4 % — ABNORMAL LOW (ref 5–13)
Monocytes %: 6 % (ref 5–13)
Monocytes Absolute: 0.2 10*3/uL (ref 0.0–1.0)
Monocytes Absolute: 0.6 10*3/uL (ref 0.0–1.0)
Neutrophils %: 72 % (ref 32–75)
Neutrophils %: 73 % (ref 32–75)
Neutrophils Absolute: 4.4 10*3/uL (ref 1.8–8.0)
Neutrophils Absolute: 6.8 10*3/uL (ref 1.8–8.0)
Nucleated RBCs: 0 PER 100 WBC
Nucleated RBCs: 0 PER 100 WBC
Platelets: 17 10*3/uL — CL (ref 150–400)
Platelets: 58 10*3/uL — ABNORMAL LOW (ref 150–400)
RBC: 2.87 M/uL — ABNORMAL LOW (ref 4.10–5.70)
RBC: 3.49 M/uL — ABNORMAL LOW (ref 4.10–5.70)
RDW: 18.6 % — ABNORMAL HIGH (ref 11.5–14.5)
RDW: 19 % — ABNORMAL HIGH (ref 11.5–14.5)
WBC: 6.1 10*3/uL (ref 4.1–11.1)
WBC: 9.3 10*3/uL (ref 4.1–11.1)
nRBC: 0 10*3/uL (ref 0.00–0.01)
nRBC: 0 10*3/uL (ref 0.00–0.01)

## 2022-09-29 LAB — POCT GLUCOSE
POC Glucose: 106 mg/dL (ref 65–117)
POC Glucose: 124 mg/dL — ABNORMAL HIGH (ref 65–117)
POC Glucose: 98 mg/dL (ref 65–117)
POC Glucose: 98 mg/dL (ref 65–117)

## 2022-09-29 LAB — BASIC METABOLIC PANEL
Anion Gap: 4 mmol/L — ABNORMAL LOW (ref 5–15)
BUN: 13 MG/DL (ref 6–20)
Bun/Cre Ratio: 8 — ABNORMAL LOW (ref 12–20)
CO2: 28 mmol/L (ref 21–32)
Calcium: 7.9 MG/DL — ABNORMAL LOW (ref 8.5–10.1)
Chloride: 105 mmol/L (ref 97–108)
Creatinine: 1.57 MG/DL — ABNORMAL HIGH (ref 0.70–1.30)
Est, Glom Filt Rate: 46 mL/min/{1.73_m2} — ABNORMAL LOW (ref >60)
Glucose: 95 mg/dL (ref 65–100)
Potassium: 4.1 mmol/L (ref 3.5–5.1)
Sodium: 137 mmol/L (ref 136–145)

## 2022-09-29 LAB — CULTURE, MRSA, SCREENING

## 2022-09-29 LAB — HEMOGLOBIN AND HEMATOCRIT
Hematocrit: 26.9 % — ABNORMAL LOW (ref 36.6–50.3)
Hemoglobin: 9.3 g/dL — ABNORMAL LOW (ref 12.1–17.0)

## 2022-09-29 MED ORDER — BALSAM PERU-CASTOR OIL TOPICAL OINTMENT
Freq: Three times a day (TID) | CUTANEOUS | Status: AC
Start: 2022-09-29 — End: 2022-09-30
  Administered 2022-09-29 – 2022-09-30 (×3): via TOPICAL

## 2022-09-29 MED ORDER — EPOETIN ALFA-EPBX 10000 UNIT/ML IJ SOLN
10000 UNIT/ML | INTRAMUSCULAR | Status: AC
Start: 2022-09-29 — End: 2022-09-30
  Administered 2022-09-29: 17:00:00 10000 [IU] via SUBCUTANEOUS

## 2022-09-29 MED FILL — B & C EX OINT: CUTANEOUS | Qty: 60

## 2022-09-29 MED FILL — MIDODRINE HCL 5 MG PO TABS: 5 MG | ORAL | Qty: 3

## 2022-09-29 MED FILL — METRONIDAZOLE 250 MG PO TABS: 250 MG | ORAL | Qty: 2

## 2022-09-29 MED FILL — RETACRIT 10000 UNIT/ML IJ SOLN: 10000 UNIT/ML | INTRAMUSCULAR | Qty: 1

## 2022-09-29 MED FILL — PANTOPRAZOLE SODIUM 40 MG IV SOLR: 40 MG | INTRAVENOUS | Qty: 40

## 2022-09-29 MED FILL — BALSAM PERU-CASTOR OIL TOPICAL OINTMENT: CUTANEOUS | Qty: 56.7

## 2022-09-29 MED FILL — MIRTAZAPINE 15 MG PO TABS: 15 MG | ORAL | Qty: 1

## 2022-09-29 MED FILL — TRIPHROCAPS 1 MG PO CAPS: 1 MG | ORAL | Qty: 1

## 2022-09-29 NOTE — Plan of Care (Signed)
Problem: Discharge Planning  Goal: Discharge to home or other facility with appropriate resources  Outcome: Progressing     Problem: Skin/Tissue Integrity  Goal: Absence of new skin breakdown  Description: 1.  Monitor for areas of redness and/or skin breakdown  2.  Assess vascular access sites hourly  3.  Every 4-6 hours minimum:  Change oxygen saturation probe site  4.  Every 4-6 hours:  If on nasal continuous positive airway pressure, respiratory therapy assess nares and determine need for appliance change or resting period.  Outcome: Progressing     Problem: Safety - Adult  Goal: Free from fall injury  Outcome: Progressing     Problem: Chronic Conditions and Co-morbidities  Goal: Patient's chronic conditions and co-morbidity symptoms are monitored and maintained or improved  Outcome: Progressing     Problem: Pain  Goal: Verbalizes/displays adequate comfort level or baseline comfort level  Outcome: Progressing     Problem: Physical Therapy - Adult  Goal: By Discharge: Performs mobility at highest level of function for planned discharge setting.  See evaluation for individualized goals.  Description: FUNCTIONAL STATUS PRIOR TO ADMISSION: Patient was independent prior to the end of July when he was admitted to [facility] with a perforated bowel. Had two month admission with multiple complications including sacral wounds and bilateral transmetatarsal amputations, + new HD dependency. Discharged to [facility] for rehab where he has been for two months. States he is hoyer lifted to w/c but is not yet standing or ambulating. Of note, he was heel WB bilaterally for transfers only during his [facility] admission (unclear if his precautions remain at this time).     HOME SUPPORT PRIOR TO ADMISSION: The patient lived with his wife in [place], [place] prior to admission to [facility] (was in town for [employer] [place]).    Physical Therapy Goals  Initiated 09/27/2022  1.  Patient will move from supine to sit and sit to supine, scoot up and  down, and roll side to side in bed with moderate assistance within 7 day(s).    2.  Patient will sit EOB x5 min with moderate assist x2 within 7 days.   3.  Patient will participate in supine HEP with minimal assistance within 7 days.   4.  Patient will tolerate OOB to chair via hoyer lift x60 min within 7 days.     09/28/2022 1507 by Athena Masse, PT  Outcome: Progressing     Problem: Occupational Therapy - Adult  Goal: By Discharge: Performs self-care activities at highest level of function for planned discharge setting.  See evaluation for individualized goals.  Description: FUNCTIONAL STATUS PRIOR TO ADMISSION:  Patient was independent prior to the end of July when he was admitted to [facility] with a perforated bowel. Had two month admission with multiple complications including sacral wounds and bilateral transmetatarsal amputations, + new HD dependency. Discharged to [facility] for rehab where he has been for two months. States he is hoyer lifted to w/c but is not yet standing or ambulating. Of note, he was heel WB bilaterally for transfers only during his [facility] admission (unclear if his precautions remain at this time).     HOME SUPPORT PRIOR TO ADMISSION: The patient lived with his wife in [place], [place] prior to admission to [facility] (was in town for [employer] [place]). Residing at [facility] prior to this admission    Receives Help From: Other (comment) (staff at rehab facility), ADL Assistance: Needs assistance,  ,  ,  ,  ,  ,  ,  Ambulation Assistance: Non-ambulatory, Transfer Assistance: Needs assistance (hoyert lift at rehab),       Occupational Therapy Goals:  Initiated 09/27/2022  1.  Patient will perform grooming with Moderate Assist within 7 day(s).  2.  Patient will perform upper body dressing with Moderate Assist within 7 day(s).  3.  Patient will perform self-feeding with Stand by Assist within 7 day(s).  4.  Patient will perform roll from L<>R with mod A x 2 within 7 day(s).  5.  Patient will participate  in upper extremity therapeutic exercise/activities with Moderate Assist for 10 minutes within 7 day(s).    6.  Patient will utilize energy conservation techniques during functional activities with verbal and visual cues within 7 day(s).    09/28/2022 1442 by Lynder Parents, OT  Outcome: Progressing     Problem: Nutrition Deficit:  Goal: Optimize nutritional status  Outcome: Progressing

## 2022-09-29 NOTE — Progress Notes (Addendum)
Physical Therapy    Attempted PT treatment. Pt received in bed with frequent cough. Declined all activity this date with general c/o not feeling well. Repositioned pt with 2 person assist, elevated HOB to tolerance. RN updated. Will con't to follow.    Roseanne Kaufman, PT, MPT

## 2022-09-29 NOTE — Progress Notes (Signed)
Occupational Therapy  09/29/22    Chart reviewed up to this date, attempted OT treatment. Pt received in bed with frequent cough. Declined all activity this date with general c/o not feeling well. Repositioned pt with 2 person assist, elevated HOB to tolerance. RN updated. Will continue to follow up as able and medically appropriate.    Lynder Parents, OTD, OTR/L

## 2022-09-29 NOTE — Progress Notes (Signed)
[facility]   [facility]  [address]  [place], [place] [address]       GI PROGRESS NOTE  Reymundo Poll, [place]  [phone] office  NP/PA in-hospital M-F until 4:30PM  After 5PM or on weekends, please call operator for physician on call      NAME: Jaime [name]   DOB:  [birth year]   MRN:  [id number]       Subjective:     Patient resting in bed, reports another loose bowel movement this morning. His nurse mentions bloody stool estimating about once per shift. Denies abdominal pain.     Objective:     VITALS:   Last 24hrs VS reviewed since prior progress note. Most recent are:  Vitals:    09/29/22 1000   BP:    Pulse: 88   Resp:    Temp:    SpO2:        PHYSICAL EXAM:  General: Cooperative, no acute distress    Neurologic:  Alert and oriented X 3.  HEENT: EOMI, no scleral icterus   Lungs:  CTA bilaterally. No wheezing  Heart:  S1 S2, regular rhythm  Abdomen: Soft, non-distended, no tenderness. +Bowel sounds  Extremities: No edema  Psych:   Good insight. Not anxious or agitated.    Lab Data Reviewed:     Recent Results (from the past 24 hour(s))   POCT Glucose    Collection Time: 09/28/22 12:05 PM   Result Value Ref Range    POC Glucose 89 65 - 117 mg/dL    Performed by: Olegario Messier  PCT    Procalcitonin    Collection Time: 09/28/22 12:53 PM   Result Value Ref Range    Procalcitonin 0.62 ng/mL   POCT Glucose    Collection Time: 09/28/22  6:11 PM   Result Value Ref Range    POC Glucose 78 65 - 117 mg/dL    Performed by: Smiley Houseman    Hemoglobin and Hematocrit    Collection Time: 09/28/22  9:17 PM   Result Value Ref Range    Hemoglobin 9.3 (L) 12.1 - 17.0 g/dL    Hematocrit 41.9 (L) 36.6 - 50.3 %   POCT Glucose    Collection Time: 09/29/22 12:05 AM   Result Value Ref Range    POC Glucose 98 65 - 117 mg/dL    Performed by: Chriss Driver    POCT Glucose    Collection Time: 09/29/22  6:28 AM   Result Value Ref Range    POC Glucose 98 65 - 117 mg/dL    Performed by: Chriss Driver    CBC with Auto Differential     Collection Time: 09/29/22  7:10 AM   Result Value Ref Range    WBC 6.1 4.1 - 11.1 K/uL    RBC 2.87 (L) 4.10 - 5.70 M/uL    Hemoglobin 8.3 (L) 12.1 - 17.0 g/dL    Hematocrit 37.9 (L) 36.6 - 50.3 %    MCV 85.0 80.0 - 99.0 FL    MCH 28.9 26.0 - 34.0 PG    MCHC 34.0 30.0 - 36.5 g/dL    RDW 02.4 (H) 09.7 - 14.5 %    Platelets 17 (LL) 150 - 400 K/uL    Nucleated RBCs 0.0 0 PER 100 WBC    nRBC 0.00 0.00 - 0.01 K/uL    Neutrophils % 72 32 - 75 %    Lymphocytes % 23 12 - 49 %    Monocytes %  4 (L) 5 - 13 %    Eosinophils % 0 0 - 7 %    Basophils % 0 0 - 1 %    Immature Granulocytes 1 (H) 0.0 - 0.5 %    Neutrophils Absolute 4.4 1.8 - 8.0 K/UL    Lymphocytes Absolute 1.4 0.8 - 3.5 K/UL    Monocytes Absolute 0.2 0.0 - 1.0 K/UL    Eosinophils Absolute 0.0 0.0 - 0.4 K/UL    Basophils Absolute 0.0 0.0 - 0.1 K/UL    Absolute Immature Granulocyte 0.1 (H) 0.00 - 0.04 K/UL    Differential Type SMEAR SCANNED      RBC Comment ANISOCYTOSIS  1+        RBC Comment TARGET CELLS  PRESENT       Basic Metabolic Panel    Collection Time: 09/29/22  7:10 AM   Result Value Ref Range    Sodium 137 136 - 145 mmol/L    Potassium 4.1 3.5 - 5.1 mmol/L    Chloride 105 97 - 108 mmol/L    CO2 28 21 - 32 mmol/L    Anion Gap 4 (L) 5 - 15 mmol/L    Glucose 95 65 - 100 mg/dL    BUN 13 6 - 20 MG/DL    Creatinine 5.09 (H) 0.70 - 1.30 MG/DL    Bun/Cre Ratio 8 (L) 12 - 20      Est, Glom Filt Rate 46 (L) >60 ml/min/1.60m2    Calcium 7.9 (L) 8.5 - 10.1 MG/DL       CT ABD/PELVIS 32/67 FINDINGS:   LOWER THORAX: Bilateral mild sized pleural effusion with adjacent atelectasis.  Small nodular infiltrate pleural-based at the left lung base anteriorly.     LIVER: No mass.  BILIARY TREE: Gallbladder is within normal limits. CBD is not dilated.  SPLEEN: within normal limits.  PANCREAS: No mass or ductal dilatation.  ADRENALS: Unremarkable.  KIDNEYS: No mass, calculus, or hydronephrosis.  STOMACH: Unremarkable.  SMALL BOWEL: No dilatation or wall thickening.  COLON: Fecal  stasis and diverticulosis. There is some mild enhancement in the colonic wall of the proximal descending and in the rectal area possibly related to inflammatory changes. No definite evidence of intraluminal extravasation of contrast by this technique.     PERITONEUM: No ascites or pneumoperitoneum.  RETROPERITONEUM: No lymphadenopathy or aortic aneurysm.     URINARY BLADDER: No mass or calculus.  BONES: No destructive bone lesion.  ABDOMINAL WALL: No mass or hernia.  ADDITIONAL COMMENTS: N/A     IMPRESSION:  No definite extravasation of contrast. As above.    CT ABD/PELVIS 11/23 IMPRESSION:     1. No CT evidence of acute GI bleed.  2. Rectal wall thickening and mucosal enhancement suggests proctitis. Large  amount stool in the colon with no bowel obstruction.  3. No definite change in left hepatic fluid collection and adjacent ill-defined  stranding.  4. Heterogeneous low density in the pancreatic head and adjacent pancreatic  stranding may be unchanged to prior study performed without IV contrast.  Correlate for clinical signs of acute pancreatitis. Consider follow-up contrast  enhanced pancreas CT to document resolution..  5. Bibasilar atelectasis and pleural effusions     Assessment:     Distal colonic wall thickening: Seen on CT as above, suspicious for proctitis. WBC 6.5. Hgb 8.3., PLT 17.  Can consider colonoscopy once PLT normalize and Bps more stable - no need for urgent colonoscopy. On antibiotics. On PPI.  Anemia: Hgb 8.3, PLT  17. Consider repeat PLT levels and hematology consult if still low. Will monitor  Hypotension: on midodrine      Patient Active Problem List   Diagnosis    Gastric perforation (HCC)    Type 2 diabetes mellitus with hyperglycemia, without long-term current use of insulin (HCC)    Intestinal obstruction (HCC)    Severe sepsis (HCC)    Septic shock (HCC)    E coli infection    Liver abscess    Pneumoperitoneum    Acute respiratory failure with hypoxia (HCC)    AKI (acute kidney injury)  ([place])    Multi-organ failure with heart failure (HCC)    Thrombocytopenia (HCC)    Bacteremia due to Escherichia coli    Hepatic abscess    Peripheral arterial disease (HCC)    Hyperbilirubinemia    Gram negative sepsis (HCC)    Septicemia (HCC)    Aspiration pneumonia of both lower lobes (HCC)    Gangrene of toe of both feet ([place])    Bandemia    Acute pancreatitis    Wound dehiscence    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 Drucilla Chalet     Signed By: [name], PA     09/29/2022  11:05 AM       Agree with above  Will follow

## 2022-09-29 NOTE — Progress Notes (Signed)
[name]                                                                                          Hospitalist Progress Note  Jaime Galas, MD  Answering service: [phone] OR [phone] from in house phone        Date of Service:  09/29/2022  NAME:  [name]  DOB:  1946-07-23  MRN:  [id number]       Admission Summary:   Per H&P: Ja[name] is a 76 y.o. male with hx of dm ii, htn, esrd on hd, pvd s/p bilateral TMA, hx of shock 2/2 perforated viscus, liver abscess s/p ir drainage, sacral ulcer who presented to ed with complaints of rectal bleeding.  Patient states he had single episode of rectal bleeding which prompted him to seek ED care.  He denies any previous history of hemorrhoids.  Diverticulosis  The patient denies any fever, chills, chest or abdominal pain, nausea, vomiting, cough, congestion, recent illness, palpitations, or dysuria.     Remarkable vitals on ER Presentation: vss  Labs Remarkable for: wbc 17.6, alb 1.8, cr 2.23  ER Images: ct abd et pelvis:  Rectal wall thickening and mucosal enhancement suggests proctitis. Large  amount stool in the colon with no bowel obstruction.  3. No definite change in left hepatic fluid collection and adjacent ill-defined  stranding.  4. Heterogeneous low density in the pancreatic head and adjacent pancreatic  stranding may be unchanged to prior study performed without IV contrast.  Correlate for clinical signs of acute pancreatitis. Consider follow-up contrast  enhanced pancreas CT to document resolution..  5. Bibasilar atelectasis and pleural effusions  ER Rx: 1l ns bolus, levaquin and flagyl     Interval history / Subjective:      Patient seen and examined, lying in bed. Patient alert and oriented X 4.     BP better, map 75 this AM   No volume removed at HD yesterday due to hypotension   Coughing       Assessment & Plan:     Acute proctitis  Constipation, resolved  Rectal Bleed, resolving  -abd/pel CT: rectal  wall thickening and mucosal enhancement suggests proctitis, large amount stool in colon with no bowel obstruction  -lipase 14  -GI following: no need for urgent colonoscopy  -RRT 11/25 for hypotension and bright red blood per rectum, Hgb 8.9 - 1 u PRBC ordered in setting of active bleeding  -abd/pel CTA (11/25): no definite extravasation of contrast  -monitor color and quantity of stools - 2 dark brown BM on 11/26, no further bright red bleeding  -on levaquin, flagyl and Vanco   -regular diet     Hypotension  -SBP 60s-90s  -evaluated by ICU today - determined to not have ICU needs at this time  -continue scheduled midodrine, on 15MG  TID  -iv Albumin X 1 give, BP improved     Thrombocytopenia,   - plt dropped from 108-17, recheck CBC STAT  - if low, will need to check for HIT     Hypoxia  Possible PNA  -on 2 L  NC with O2 sat 95-97%  -CXR (11/25): bibasilar airspace opacities concerning for infection  -WBC down trending - 19.2 --> 16.7; patient afebrile  -wean oxygen as able to maintain O2 sat >92%  -on Levaquin/Vanco    ESRD on HD MWF  -Nephrology following  -continue home midodrine and renvela  -cxr to evaluate for pulm edema     Hypoglycemia, resolved  -discontinue D5W in setting of fluid overload  -monitor glucose levels  -hypoglycemia protocol in place    Mood Disorder  -continue home mirtazapine    PVD s/p toe amputation left foot  Chronic sacral wounds  Left heel ulcer  -wound care following     Code status: Full  Prophylaxis: SCDs  Care Plan discussed with: patient, patient's wife, RN, ICU  Anticipated Disposition: TBD  Inpatient  Cardiac monitoring: Telemetry           Vital Signs:    Last 24hrs VS reviewed since prior progress note. Most recent are:  Vitals:    09/29/22 1000   BP:    Pulse: 88   Resp:    Temp:    SpO2:          Intake/Output Summary (Last 24 hours) at 09/29/2022 1131  Last data filed at 09/28/2022 1230  Gross per 24 hour   Intake 500 ml   Output 300 ml   Net 200 ml          Physical  Examination:     I had a face to face encounter with this patient and independently examined them on 09/29/2022 as outlined below:          General : alert, oriented X4, awake, no acute distress, bilateral horizontal nystagmus   Chest: on 2L, coarse bilaterally  CVS: S1 S2 heard, Capillary refill less than 2 seconds  Abd: soft/non tender, non distended, hypoactive bowel sounds  Ext: no clubbing, no cyanosis, 1+ lower extremity edema  Neuro/Psych: pleasant mood and affect, CN 2-12 grossly intact, s  Skin: warm, pale, mottling noted to upper extremities and upper thighs    Data Review:    Review and/or order of clinical lab test      I have personally and independently reviewed all pertinent labs, diagnostic studies, imaging, and have provided independent interpretation of the same.     Labs:     Recent Labs     09/28/22  0727 09/28/22  2117 09/29/22  0710   WBC 15.5*  --  6.1   HGB 11.4* 9.3* 8.3*   HCT 33.4* 26.9* 24.4*   PLT 108*  --  17*       Recent Labs     09/27/22  0315 09/28/22  0415 09/29/22  0710   NA 134* 136 137   K 4.4 3.6 4.1   CL 103 104 105   CO2 27 26 28    BUN 27* 26* 13   PHOS  --  1.0*  --        Recent Labs     09/27/22  0315   ALT 15   GLOB 3.1       Recent Labs     09/27/22  1751   INR 1.9*        No results for input(s): "TIBC", "FERR" in the last 72 hours.    Invalid input(s): "FE", "PSAT"   No results found for: "FOL", "RBCF"   No results for input(s): "PH", "PCO2", "PO2" in the last 72 hours.  No results  for input(s): "CPK" in the last 72 hours.    Invalid input(s): "CPKMB", "CKNDX", "TROIQ"  No results found for: "CHOL", "Jarrell", "CHLST", "CHOLV", "HDL", "HDLC", "LDL", "LDLC", "TGLX", "TRIGL"  No results found for: "GLUCPOC"    Notes reviewed from all clinical/nonclinical/nursing services involved in patient's clinical care. Care coordination discussions were held with appropriate clinical/nonclinical/ nursing providers based on care coordination needs.       Patients current active  Medications were reviewed, considered, added and adjusted based on the clinical condition today.      Home Medications were reconciled to the best of my ability given all available resources at the time of admission. Route is PO if not otherwise noted.    Medications Reviewed:     Current Facility-Administered Medications   Medication Dose Route Frequency    epoetin alfa-epbx (RETACRIT) injection 10,000 Units  10,000 Units SubCUTAneous Once per day on Mon Wed Fri    balsum peru-castor oil (VENELEX) ointment   Topical TID    Virt-Caps 1 mg  1 capsule Oral Daily    dextrose 10 % infusion   IntraVENous Continuous    metroNIDAZOLE (FLAGYL) tablet 500 mg  500 mg Oral 3 times per day    Vancomycin - Pharmacy to Dose (HD Patient)   Other RX Placeholder    midodrine (PROAMATINE) tablet 15 mg  15 mg Oral TID    pantoprazole (PROTONIX) 40 mg in sodium chloride (PF) 0.9 % 10 mL injection  40 mg IntraVENous Q12H    glucose chewable tablet 16 g  4 tablet Oral PRN    dextrose bolus 10% 125 mL  125 mL IntraVENous PRN    Or    dextrose bolus 10% 250 mL  250 mL IntraVENous PRN    glucagon injection 1 mg  1 mg SubCUTAneous PRN    dextrose 10 % infusion   IntraVENous Continuous PRN    heparin (porcine) 1000 UNIT/ML injection 1,800 Units  1,800 Units IntraCATHeter PRN    And    heparin (porcine) 1000 UNIT/ML injection 1,800 Units  1,800 Units IntraCATHeter PRN    levoFLOXacin (LEVAQUIN) 500 MG/100ML infusion 500 mg  500 mg IntraVENous Q48H    albumin human 25% IV solution 25 g  25 g IntraVENous PRN    polyethylene glycol (GLYCOLAX) packet 17 g  17 g Oral Daily    mirtazapine (REMERON) tablet 7.5 mg  7.5 mg Oral Nightly    sodium chloride flush 0.9 % injection 5-40 mL  5-40 mL IntraVENous 2 times per day    sodium chloride flush 0.9 % injection 5-40 mL  5-40 mL IntraVENous PRN    0.9 % sodium chloride infusion   IntraVENous PRN    ondansetron (ZOFRAN-ODT) disintegrating tablet 4 mg  4 mg Oral Q8H PRN    Or    ondansetron (ZOFRAN)  injection 4 mg  4 mg IntraVENous Q6H PRN    polyethylene glycol (GLYCOLAX) packet 17 g  17 g Oral Daily PRN    acetaminophen (TYLENOL) tablet 650 mg  650 mg Oral Q6H PRN    Or    acetaminophen (TYLENOL) suppository 650 mg  650 mg Rectal Q6H PRN     ______________________________________________________________________  EXPECTED LENGTH OF STAY: Unable to retrieve estimated LOS  ACTUAL LENGTH OF STAY:          5                 Shon Hough, MD

## 2022-09-29 NOTE — Progress Notes (Signed)
[facility]   [address] [place], [place] [address]  Phone: [phone]   [phone]    [email or web address]     Nephrology Progress Note    Patient Name : [facility]ime [name]     DOB : 1946-04-15     MRN : [id number]  Date of Admission : 09/27/2022  Date of Servive : 09/29/22    CC:  Follow up for ESRD       Assessment and Plan   ESRD-HD  -Dialyzes MWF at [facility]  -Access: [facility]: Placed 07/01/2022  - Next HD due tomorrow    Acute proctitis:  Rectal bleed  -Per primary team  - GI folloowing    MBD of CKD  - sCa stable will request Phos level  - on Renvela  - will check ipth    Hypoglycemia:  -On D5W at 50 cc/h    Anemia in CKD:  - Hgb stable 8.3 this AM  - EPO started 10K 3 times a week- 1st dose  today  - recommend transfusion for Hgb<7  - CTA (-) for active bleed  - GI following    Chronic hypotension:  -Continue midodrine  -IV albumin PRN    PVD: S/p B/L TMA  DM2  Hx of pancreatitis  Hx of liver abscess s/p drainage  Sacral ulcer     Interval History:  Seen and examined this AM. States he had a good night. NO UF yesterday 2/2 low BP. SBP > overnight.  Denies discomfort.    Review of Systems: A comprehensive review of systems was negative.    Current Medications:   Current Facility-Administered Medications   Medication Dose Route Frequency    Virt-Caps 1 mg  1 capsule Oral Daily    dextrose 10 % infusion   IntraVENous Continuous    metroNIDAZOLE (FLAGYL) tablet 500 mg  500 mg Oral 3 times per day    Vancomycin - Pharmacy to Dose (HD Patient)   Other RX Placeholder    midodrine (PROAMATINE) tablet 15 mg  15 mg Oral TID    pantoprazole (PROTONIX) 40 mg in sodium chloride (PF) 0.9 % 10 mL injection  40 mg IntraVENous Q12H    glucose chewable tablet 16 g  4 tablet Oral PRN    dextrose bolus 10% 125 mL  125 mL IntraVENous PRN    Or    dextrose bolus 10% 250 mL  250 mL IntraVENous PRN    glucagon injection 1 mg  1 mg SubCUTAneous PRN    dextrose 10 % infusion    IntraVENous Continuous PRN    heparin (porcine) 1000 UNIT/ML injection 1,800 Units  1,800 Units IntraCATHeter PRN    And    heparin (porcine) 1000 UNIT/ML injection 1,800 Units  1,800 Units IntraCATHeter PRN    levoFLOXacin (LEVAQUIN) 500 MG/100ML infusion 500 mg  500 mg IntraVENous Q48H    albumin human 25% IV solution 25 g  25 g IntraVENous PRN    polyethylene glycol (GLYCOLAX) packet 17 g  17 g Oral Daily    mirtazapine (REMERON) tablet 7.5 mg  7.5 mg Oral Nightly    sodium chloride flush 0.9 % injection 5-40 mL  5-40 mL IntraVENous 2 times per day    sodium chloride flush 0.9 % injection 5-40 mL  5-40 mL IntraVENous PRN    0.9 % sodium chloride infusion   IntraVENous PRN    ondansetron (ZOFRAN-ODT) disintegrating tablet 4 mg  4 mg Oral Q8H PRN    Or    ondansetron (ZOFRAN) injection 4 mg  4 mg IntraVENous Q6H PRN    polyethylene glycol (GLYCOLAX) packet 17 g  17 g Oral Daily PRN    acetaminophen (TYLENOL) tablet 650 mg  650 mg Oral Q6H PRN    Or    acetaminophen (TYLENOL) suppository 650 mg  650 mg Rectal Q6H PRN      Allergies   Allergen Reactions    Augmentin [Amoxicillin-Pot Clavulanate] Hives     Tolerated ceftriaxone 06/2022    Codeine Itching     Unknown reaction    Oxycodone Itching       Objective:  Vitals:    Vitals:    09/29/22 0400 09/29/22 0429 09/29/22 0600 09/29/22 0751   BP: (!) 92/54   (!) 103/54   Pulse: 78 82 78 75   Resp: 26   18   Temp:    98.3 F (36.8 C)   TempSrc:    Oral   SpO2: 94%      Weight:       Height:         Intake and Output:  No intake/output data recorded.  11/26 1901 - 11/28 0700  In: 500   Out: 300     Physical Examination:  General: ill looking  Neck:  Supple, no mass  Resp:  Decreased BS no wheezing , normal respiratory effort  CV:  RRR,  no murmur or rub, trace ankle edema  GI:  Soft, NT, + BS, no HS megaly  Neurologic:  Non focal  Dialysis Access : [facility]    []     High complexity decision making was performed  []     Patient is at high-risk of decompensation with multiple  organ involvement    Lab Data Personally Reviewed: I have reviewed all the pertinent labs, microbiology data and radiology studies during assessment.    Labs:  Recent Labs     09/26/22  0920 09/26/22  0931 09/27/22  0315 09/28/22  0415   NA 137  --  134* 136   K 3.6  --  4.4 3.6   CL 103  --  103 104   CO2 30  --  27 26   GLUCOSE 81  --  96 102*   BUN 27*  --  27* 26*   CREATININE 1.90* 1.8* 2.09* 2.31*   CALCIUM 8.3*  --  8.5  8.3* 8.5         Recent Labs     09/26/22  0920 09/26/22  1731 09/27/22  0315 09/28/22  0727 09/28/22  2117   WBC 19.2*  --  16.7* 15.5*  --    RBC 3.07*  --  3.54* 3.87*  --    HGB 8.9*   < > 10.3* 11.4* 9.3*   HCT 26.6*   < > 30.6* 33.4* 26.9*   MCV 86.6  --  86.4 86.3  --    MCH 29.0  --  29.1 29.5  --    MCHC 33.5  --  33.7 34.1  --    RDW 19.4*  --  19.2* 18.9*  --    PLT 87*  --  92* 108*  --    MPV 10.5  --  11.5 11.2  --     < > = values in this interval not displayed.       Recent Labs     09/26/22  0920 09/27/22  0315   GLOB 3.1 3.1       Recent Labs     09/27/22  1751   INR 1.9*        No results for input(s): "CPK", "CKMB", "TROPONINI" in the last 72 hours.    Invalid input(s): "B-NP"  Invalid input(s): "PHI", "PCO2I", "PO2I", "FIO2I"     Ventilator:       Microbiology:  No results found for: "SDES"  No components found for: "CULT"      I have reviewed the flowsheets.  Chart and Pertinent Notes have been reviewed.   No change in PMH ,family and social history from Consult note.      Samule Ohm, APRN - NP  [facility]

## 2022-09-29 NOTE — Wound Image (Addendum)
WOCN Note:   Follow up visit    Jaime [name] is a 76 yr old male with pmhx of htn, pvd, DM 2, ESRD with HD MWF, sacral decubitis ulcer  who presented to ED with c/o rectal bleeding.  Admitted on 09/27/2022     Lab Results   Component Value Date/Time    WBC 6.1 09/29/2022 07:10 AM    LABA1C 7.7 (H) 06/02/2022 12:22 PM    POCGLU 98 09/29/2022 06:28 AM    POCGLU 98 09/29/2022 12:05 AM    HGB 8.3 (L) 09/29/2022 07:10 AM    HCT 24.4 (L) 09/29/2022 07:10 AM    PLT 17 (LL) 09/29/2022 07:10 AM        Tobacco Use      Smoking status: Never      Smokeless tobacco: Never     ADULT ORAL NUTRITION SUPPLEMENT; Lunch, Dinner; Protein Modular  ADULT ORAL NUTRITION SUPPLEMENT; Breakfast, Dinner; Wound Healing Oral Supplement  ADULT ORAL NUTRITION SUPPLEMENT; Lunch; Other Oral Supplement; Boost Breeze or Ensure Clear - any flavor, whatever is in stock  DIET ONE TIME MESSAGE;  ADULT DIET; Regular     Assessment:   Alert and appropriately communicative. Reports generalized pain.   Moderate - Total assist in repositioning for visual assessment of sacrum  Wearing briefs, having frequent liquid stools, cleansed of 3 stools during short visit  Surface: foam mattress - will order LAL  Left heel intact, with hyperkeratosis and without erythema.  Heels offloaded with boots.  No Palpable DP or PT pulses bilaterally    1. POA Unstageable Pressure Injury to Right Heel  Posterior heel with 1.5 x 1 x <0.1 cm  90% tan, 10% pink   Small serous exudate noted  Periwound intact & with ~ 0.2 cm erythema    Found with dressing dated 11/24  Tx: cleansed with Vashe damp gauze, applied TheraHoney, covered with foam dressing      2. POA Wounds to Bilateral Buttocks    Kissing wounds  Left:     Superior - New 3.5 x 2.5 x 0.1 cm, 100% red, small serous exudate     Inferior - 1.5 x 1.5 x 0.1 cm, 100% soft adherent tan, small serous exudate           Right:    Superior - 2 x 1.5 x 0.1 cm     Inferior  - 2.5 x 2 x 0.1 cm, periwound with 3 x 2 cm deep purple non-blanching area         Both above with 100% soft adherent tan, small serous exudate    All periwounds now with deep purple non-blanching areas depicted above    Right lateral buttock with 3.5 x 2 cm deep purple non-blanching area    Found with no hydrocolloids in place   Tx: cleansed with Vashe damp gauze, applied then foam dressing     3. POA Healing TMA Site to Left Foot  Three intact dry crusts  Tx: painted with betadine, left open to air    Wound Recommendations:    Right heel: Daily - cleanse with Vashe damp gauze, applied TheraHoney, cover with foam dressing, offload heels at all times  Buttocks: Three Times Daily - apply Balsam peru-castor oil (Venelex), cover with two foam dressings placed to avoid covering the anus, be sure to apply and cover to purple area on lateral right buttock   Left TMA: Daily - Paint with betadine, leave open to air  PI Prevention:  Turn/reposition approximately every 2 hours  Offload heels with heels hanging off end of pillow at all times while in bed.  Sacral Foam dressing: lift to assess regularly; change as needed. Discontinue if incontinence is frequently soiling dressing.     Low Air Loss mattress  ordered. Use only flat sheet and one incontinence pad.     Discussed assessment and plan with Dr. Denita Lung and RN, Orpha Bur.    Transition of Care: Plan to follow weekly and as needed while admitted to hospital.      Isaac Bliss  MSN, RN  [facility]  Office [phone]   Available through [employer]

## 2022-09-30 ENCOUNTER — Emergency Department: Admit: 2022-09-30 | Payer: MEDICARE

## 2022-09-30 ENCOUNTER — Inpatient Hospital Stay
Admission: EM | Admit: 2022-09-30 | Discharge: 2022-11-02 | Disposition: E | Payer: Medicare Other | Source: Home / Self Care | Admitting: Student in an Organized Health Care Education/Training Program

## 2022-09-30 DIAGNOSIS — R0902 Hypoxemia: Secondary | ICD-10-CM

## 2022-09-30 LAB — COVID-19, RAPID: SARS-CoV-2, Rapid: NOT DETECTED

## 2022-09-30 LAB — CBC WITH AUTO DIFFERENTIAL
Basophils %: 0 % (ref 0–1)
Basophils %: 0 % (ref 0–1)
Basophils Absolute: 0 10*3/uL (ref 0.0–0.1)
Basophils Absolute: 0 10*3/uL (ref 0.0–0.1)
Eosinophils %: 0 % (ref 0–7)
Eosinophils %: 0 % (ref 0–7)
Eosinophils Absolute: 0 10*3/uL (ref 0.0–0.4)
Eosinophils Absolute: 0 10*3/uL (ref 0.0–0.4)
Hematocrit: 25.5 % — ABNORMAL LOW (ref 36.6–50.3)
Hematocrit: 27 % — ABNORMAL LOW (ref 36.6–50.3)
Hemoglobin: 8.7 g/dL — ABNORMAL LOW (ref 12.1–17.0)
Hemoglobin: 9.2 g/dL — ABNORMAL LOW (ref 12.1–17.0)
Immature Granulocytes %: 1 % — ABNORMAL HIGH (ref 0.0–0.5)
Immature Granulocytes %: 1 % — ABNORMAL HIGH (ref 0.0–0.5)
Immature Granulocytes Absolute: 0.1 10*3/uL — ABNORMAL HIGH (ref 0.00–0.04)
Immature Granulocytes Absolute: 0.1 10*3/uL — ABNORMAL HIGH (ref 0.00–0.04)
Lymphocytes %: 15 % (ref 12–49)
Lymphocytes %: 15 % (ref 12–49)
Lymphocytes Absolute: 1.5 10*3/uL (ref 0.8–3.5)
Lymphocytes Absolute: 1.7 10*3/uL (ref 0.8–3.5)
MCH: 29.2 PG (ref 26.0–34.0)
MCH: 28.8 PG (ref 26.0–34.0)
MCHC: 34.1 g/dL (ref 30.0–36.5)
MCHC: 34.1 g/dL (ref 30.0–36.5)
MCV: 85.6 FL (ref 80.0–99.0)
MCV: 84.6 FL (ref 80.0–99.0)
MPV: 11.3 FL (ref 8.9–12.9)
MPV: 11.2 FL (ref 8.9–12.9)
Monocytes %: 9 % (ref 5–13)
Monocytes %: 7 % (ref 5–13)
Monocytes Absolute: 0.9 10*3/uL (ref 0.0–1.0)
Monocytes Absolute: 0.8 10*3/uL (ref 0.0–1.0)
Neutrophils %: 75 % (ref 32–75)
Neutrophils %: 76 % — ABNORMAL HIGH (ref 32–75)
Neutrophils Absolute: 7.4 10*3/uL (ref 1.8–8.0)
Neutrophils Absolute: 8.3 10*3/uL — ABNORMAL HIGH (ref 1.8–8.0)
Nucleated RBCs: 0 PER 100 WBC
Nucleated RBCs: 0 PER 100 WBC
Platelets: 98 10*3/uL — ABNORMAL LOW (ref 150–400)
Platelets: 124 10*3/uL — ABNORMAL LOW (ref 150–400)
RBC: 2.98 M/uL — ABNORMAL LOW (ref 4.10–5.70)
RBC: 3.19 M/uL — ABNORMAL LOW (ref 4.10–5.70)
RDW: 19 % — ABNORMAL HIGH (ref 11.5–14.5)
RDW: 18.9 % — ABNORMAL HIGH (ref 11.5–14.5)
WBC: 9.9 10*3/uL (ref 4.1–11.1)
WBC: 11 10*3/uL (ref 4.1–11.1)
nRBC: 0 10*3/uL (ref 0.00–0.01)
nRBC: 0 10*3/uL (ref 0.00–0.01)

## 2022-09-30 LAB — RENAL FUNCTION PANEL
Albumin: 2.2 g/dL — ABNORMAL LOW (ref 3.5–5.0)
Anion Gap: 4 mmol/L — ABNORMAL LOW (ref 5–15)
BUN/Creatinine Ratio: 10 — ABNORMAL LOW (ref 12–20)
BUN: 20 MG/DL (ref 6–20)
CO2: 28 mmol/L (ref 21–32)
Calcium: 8.1 MG/DL — ABNORMAL LOW (ref 8.5–10.1)
Chloride: 104 mmol/L (ref 97–108)
Creatinine: 1.93 MG/DL — ABNORMAL HIGH (ref 0.70–1.30)
Est, Glom Filt Rate: 36 mL/min/{1.73_m2} — ABNORMAL LOW (ref >60)
Glucose: 105 mg/dL — ABNORMAL HIGH (ref 65–100)
Phosphorus: 1.4 MG/DL — ABNORMAL LOW (ref 2.6–4.7)
Potassium: 3.6 mmol/L (ref 3.5–5.1)
Sodium: 136 mmol/L (ref 136–145)

## 2022-09-30 LAB — HEPATITIS B SURFACE ANTIGEN
Hep B S Ag Interp: NEGATIVE
Hepatitis B Surface Ag: <0.1 Index

## 2022-09-30 LAB — EKG 12-LEAD
Atrial Rate: 105 {beats}/min
P Axis: 30 degrees
P-R Interval: 158 ms
Q-T Interval: 366 ms
QRS Duration: 72 ms
QTc Calculation (Bazett): 483 ms
R Axis: 0 degrees
T Axis: 44 degrees
Ventricular Rate: 105 {beats}/min

## 2022-09-30 LAB — EXTRA TUBES HOLD

## 2022-09-30 LAB — COMPREHENSIVE METABOLIC PANEL
ALT: 14 U/L (ref 12–78)
AST: 27 U/L (ref 15–37)
Albumin/Globulin Ratio: 1 — ABNORMAL LOW (ref 1.1–2.2)
Albumin: 2.7 g/dL — ABNORMAL LOW (ref 3.5–5.0)
Alk Phosphatase: 130 U/L — ABNORMAL HIGH (ref 45–117)
Anion Gap: 3 mmol/L — ABNORMAL LOW (ref 5–15)
BUN/Creatinine Ratio: 9 — ABNORMAL LOW (ref 12–20)
BUN: 11 MG/DL (ref 6–20)
CO2: 30 mmol/L (ref 21–32)
Calcium: 7.9 MG/DL — ABNORMAL LOW (ref 8.5–10.1)
Chloride: 103 mmol/L (ref 97–108)
Creatinine: 1.28 MG/DL (ref 0.70–1.30)
Est, Glom Filt Rate: 58 mL/min/{1.73_m2} — ABNORMAL LOW (ref >60)
Globulin: 2.6 g/dL (ref 2.0–4.0)
Glucose: 78 mg/dL (ref 65–100)
Potassium: 3.7 mmol/L (ref 3.5–5.1)
Sodium: 136 mmol/L (ref 136–145)
Total Bilirubin: 3.2 MG/DL — ABNORMAL HIGH (ref 0.2–1.0)
Total Protein: 5.3 g/dL — ABNORMAL LOW (ref 6.4–8.2)

## 2022-09-30 LAB — POCT GLUCOSE
POC Glucose: 108 mg/dL (ref 65–117)
POC Glucose: 107 mg/dL (ref 65–117)
POC Glucose: 89 mg/dL (ref 65–117)

## 2022-09-30 LAB — RAPID INFLUENZA A/B ANTIGENS
Influenza A Ag: POSITIVE — AB
Influenza B Ag: NEGATIVE

## 2022-09-30 LAB — LACTIC ACID: Lactic Acid, Plasma: 3.1 MMOL/L (ref 0.4–2.0)

## 2022-09-30 MED ORDER — METRONIDAZOLE 500 MG PO TABS
500 MG | ORAL_TABLET | Freq: Two times a day (BID) | ORAL | 0 refills | Status: AC
Start: 2022-09-30 — End: 2022-10-03

## 2022-09-30 MED ORDER — MIDODRINE HCL 10 MG PO TABS
10 MG | ORAL_TABLET | Freq: Three times a day (TID) | ORAL | 1 refills | Status: AC
Start: 2022-09-30 — End: 2022-10-05

## 2022-09-30 MED ORDER — MIDODRINE HCL 5 MG PO TABS
5 MG | ORAL | Status: AC
Start: 2022-09-30 — End: 2022-09-30
  Administered 2022-09-30: 5 mg via ORAL

## 2022-09-30 MED ORDER — LEVOFLOXACIN 500 MG PO TABS
500 MG | ORAL_TABLET | ORAL | 0 refills | Status: AC
Start: 2022-09-30 — End: 2022-10-04

## 2022-09-30 MED ORDER — ALBUMIN HUMAN 25 % IV SOLN
25 % | Freq: Once | INTRAVENOUS | Status: DC
Start: 2022-09-30 — End: 2022-09-30

## 2022-09-30 MED ORDER — SODIUM CHLORIDE 0.9 % IV BOLUS
0.9 % | Freq: Once | INTRAVENOUS | Status: AC
Start: 2022-09-30 — End: 2022-10-01
  Administered 2022-09-30: 23:00:00 1000 mL via INTRAVENOUS

## 2022-09-30 MED FILL — PANTOPRAZOLE SODIUM 40 MG IV SOLR: 40 MG | INTRAVENOUS | Qty: 40

## 2022-09-30 MED FILL — METRONIDAZOLE 250 MG PO TABS: 250 MG | ORAL | Qty: 2

## 2022-09-30 MED FILL — HEPARIN SODIUM (PORCINE) 1000 UNIT/ML IJ SOLN: 1000 UNIT/ML | INTRAMUSCULAR | Qty: 2

## 2022-09-30 MED FILL — MIDODRINE HCL 5 MG PO TABS: 5 MG | ORAL | Qty: 1

## 2022-09-30 MED FILL — LEVOFLOXACIN IN D5W 500 MG/100ML IV SOLN: 500 MG/100ML | INTRAVENOUS | Qty: 100

## 2022-09-30 MED FILL — RETACRIT 10000 UNIT/ML IJ SOLN: 10000 UNIT/ML | INTRAMUSCULAR | Qty: 1

## 2022-09-30 MED FILL — ALBUTEIN 25 % IV SOLN: 25 % | INTRAVENOUS | Qty: 200

## 2022-09-30 MED FILL — MIDODRINE HCL 5 MG PO TABS: 5 MG | ORAL | Qty: 3

## 2022-09-30 MED FILL — ALBUTEIN 25 % IV SOLN: 25 % | INTRAVENOUS | Qty: 100

## 2022-09-30 MED FILL — TRIPHROCAPS 1 MG PO CAPS: 1 MG | ORAL | Qty: 1

## 2022-09-30 MED FILL — MIRTAZAPINE 15 MG PO TABS: 15 MG | ORAL | Qty: 1

## 2022-09-30 MED FILL — SODIUM CHLORIDE 0.9 % IV SOLN: 0.9 % | INTRAVENOUS | Qty: 1000

## 2022-09-30 NOTE — Plan of Care (Signed)
Problem: Discharge Planning  Goal: Discharge to home or other facility with appropriate resources  Outcome: Progressing     Problem: Skin/Tissue Integrity  Goal: Absence of new skin breakdown  Description: 1.  Monitor for areas of redness and/or skin breakdown  2.  Assess vascular access sites hourly  3.  Every 4-6 hours minimum:  Change oxygen saturation probe site  4.  Every 4-6 hours:  If on nasal continuous positive airway pressure, respiratory therapy assess nares and determine need for appliance change or resting period.  Outcome: Progressing     Problem: Safety - Adult  Goal: Free from fall injury  Outcome: Progressing     Problem: Chronic Conditions and Co-morbidities  Goal: Patient's chronic conditions and co-morbidity symptoms are monitored and maintained or improved  Outcome: Progressing     Problem: Pain  Goal: Verbalizes/displays adequate comfort level or baseline comfort level  Outcome: Progressing     Problem: Nutrition Deficit:  Goal: Optimize nutritional status  Outcome: Progressing

## 2022-09-30 NOTE — Progress Notes (Signed)
4 Eyes Skin Assessment     NAME:  Lev Cervone  DATE OF BIRTH:  August 22, 1946  MEDICAL RECORD NUMBER:  [id number]    The patient is being assessed for  Admission    I agree that at least one RN has performed a thorough Head to Toe Skin Assessment on the patient. ALL assessment sites listed below have been assessed.      Areas assessed by both nurses:    Arms, Elbows, Hands, Sacrum. Buttock, Coccyx, Ischium, and Legs. Feet and Heels        Does the Patient have a Wound? Yes wound(s) were present on assessment. LDA wound assessment was Initiated and completed by RN       Braden Prevention initiated by RN: Yes  Wound Care Orders initiated by RN: Yes    Pressure Injury (Stage 3,4, Unstageable, DTI, NWPT, and Complex wounds) if present, place Wound referral order by RN under ORDER ENTRY: Yes    New Ostomies, if present place, Ostomy referral order under ORDER ENTRY: No     Nurse 1 eSignature: Electronically signed by Mina Marble, RN on 10/21/22 at 10:14 PM EST    **SHARE this note so that the co-signing nurse can place an eSignature**    Nurse 2 eSignature: Electronically signed by Wyn Quaker, RN on 10/21/22 at 10:28 PM EST

## 2022-09-30 NOTE — Other (Signed)
09/20/2022 0900   Vital Signs   BP 93/64   Temp 97.8 F (36.6 C)   Pulse 75   Respirations 20   SpO2 99 %   Pain Assessment   Pain Assessment None - Denies Pain   Post-Hemodialysis Assessment   Post-Treatment Procedures Blood returned;Catheter capped, clamped and heparinized x 2 ports   Herbalist   Rinseback Volume (ml) 300 ml   Blood Volume Processed (Liters) 67 L   Dialyzer Clearance Lightly streaked   Duration of Treatment (minutes) 180 minutes   Hemodialysis Intake (ml) 500 ml   Hemodialysis Output (ml) 2 ml   NET Removed (ml) -498   Tolerated Treatment Poor   Bilateral Breath Sounds Diminished   Edema Generalized   Edema Generalized Trace   RUE Edema Trace   LUE Edema Trace   Time Off 0900     Primary RN SBAR: Henderson Cloud RN  Comments: tolerated tx poorly, no UF d/t low BP

## 2022-09-30 NOTE — ED Provider Notes (Addendum)
[facility] EMERGENCY DEP  EMERGENCY DEPARTMENT ENCOUNTER      Pt Name: Jaime [name]  MRN: [id number]  [place] 1946/07/19  Date of evaluation: 09/19/2022  Provider: Tamsen Meek, MD    CHIEF COMPLAINT       Chief Complaint   Patient presents with    Hypertension         HISTORY OF PRESENT ILLNESS   (Location/Symptom, Timing/Onset, Context/Setting, Quality, Duration, Modifying Factors, Severity)  Note limiting factors.   76 year old male with PMHx of ESRD on HD (MWF at [place] home), acute proctitis with rectal bleeding requiring admission with discharge from [facility] 1 week ago, anemia, DM 2, PVD, liver abscess, pancreatitis, and chronic sacral ulcer presents to the emergency department from his nursing home for evaluation of hypotension.  Per EMS, pressures were 80s over 50s on their arrival.  Patient's chief complaint at this time is pain at his sacral wound site.  History otherwise limited secondary to patient discomfort.    The history is provided by the patient.         Review of External Medical Records:     Nursing Notes were reviewed.    REVIEW OF SYSTEMS    (2-9 systems for level 4, 10 or more for level 5)     Review of Systems   Constitutional: Negative.    HENT: Negative.     Eyes: Negative.    Respiratory: Negative.     Cardiovascular: Negative.    Gastrointestinal: Negative.    Genitourinary: Negative.    Musculoskeletal: Negative.    Skin:  Positive for wound.   Neurological: Negative.    Psychiatric/Behavioral: Negative.         Except as noted above the remainder of the review of systems was reviewed and negative.       PAST MEDICAL HISTORY     Past Medical History:   Diagnosis Date    Diabetes mellitus ([place])     Hemodialysis patient ([place]) 07/12/2022    Hypertension          SURGICAL HISTORY       Past Surgical History:   Procedure Laterality Date    BLADDER SURGERY N/A 06/01/2022    ENDOSCOPY OF ILEAL CONDUIT performed by Melene Plan, MD at [facility] MAIN OR    CT VISCERAL PERCUTANEOUS  DRAIN  06/05/2022    CT VISCERAL PERCUTANEOUS DRAIN 06/05/2022 [facility] RAD CT    FOOT DEBRIDEMENT Bilateral 07/23/2022    BILATERAL TRANSMETATARSAL AMPUTATION performed by Salomon Fick, DPM at [facility] MAIN OR    IR TUNNELED CATHETER PLACEMENT GREATER THAN 5 YEARS  07/01/2022    IR TUNNELED CATHETER PLACEMENT GREATER THAN 5 YEARS 07/01/2022 [place], APRN - NP [facility] RAD ANGIO IR    LAPAROSCOPY N/A 06/01/2022    LAPAROSCOPY DIAGNOSTIC performed by Melene Plan, MD at [facility] N/A 06/01/2022    LAPAROTOMY EXPLORATORY performed by Melene Plan, MD at [facility] MAIN OR         CURRENT MEDICATIONS       Previous Medications    ACETAMINOPHEN (TYLENOL) 325 MG TABLET    Take 2 tablets by mouth every 6 hours as needed for Pain    LEVOFLOXACIN (LEVAQUIN) 500 MG TABLET    Take 1 tablet by mouth every 48 hours for 4 days    METRONIDAZOLE (FLAGYL) 500 MG TABLET    Take 1 tablet by mouth 2 times daily for 3 days    MIDODRINE (  PROAMATINE) 10 MG TABLET    Take 1.5 tablets by mouth 3 times daily    MIRTAZAPINE (REMERON) 7.5 MG TABLET    Take 1 tablet by mouth nightly    SEVELAMER (RENVELA) 2.4 G PACK PACKET    Take 2.4 g by mouth 3 times daily (with meals)    ZINC SULFATE (ZINCATE) 220 (50 ZN) MG CAPSULE    Take 1 capsule by mouth daily       ALLERGIES     Augmentin [amoxicillin-pot clavulanate], Codeine, and Oxycodone    FAMILY HISTORY     No family history on file.       SOCIAL HISTORY       Social History     Socioeconomic History    Marital status: Married   Tobacco Use    Smoking status: Never    Smokeless tobacco: Never   Vaping Use    Vaping Use: Never used   Substance and Sexual Activity    Alcohol use: Not Currently           PHYSICAL EXAM    (up to 7 for level 4, 8 or more for level 5)     ED Triage Vitals   BP Temp Temp src Pulse Resp SpO2 Height Weight   -- -- -- -- -- -- -- --       There is no height or weight on file to calculate BMI.    Physical Exam  Vitals and nursing note reviewed.   Constitutional:        General: He is in acute distress.      Appearance: Normal appearance. He is ill-appearing.      Comments: Pale, chronically ill-appearing elderly man lying in bed with nasal cannula in place appearing very uncomfortable   HENT:      Head: Normocephalic and atraumatic.      Nose: Nose normal.      Mouth/Throat:      Mouth: Mucous membranes are dry.   Eyes:      Pupils: Pupils are equal, round, and reactive to light.   Cardiovascular:      Rate and Rhythm: Normal rate and regular rhythm.      Pulses: Normal pulses.   Pulmonary:      Effort: Pulmonary effort is normal.      Breath sounds: Rhonchi (Diffuse) present.   Abdominal:      General: There is no distension.      Palpations: Abdomen is soft.      Tenderness: There is no abdominal tenderness.   Musculoskeletal:      Cervical back: Normal range of motion.   Skin:     General: Skin is warm and dry.      Coloration: Skin is pale.      Findings: Lesion (Ulcerated wound present over sacral area, bleeding is present, with large amount of stool mixed into the wound.  No bleeding appreciated grossly on rectal exam) present.   Neurological:      General: No focal deficit present.      Mental Status: He is alert and oriented to person, place, and time.   Psychiatric:         Mood and Affect: Mood normal.       DIAGNOSTIC RESULTS     EKG: All EKG's are interpreted by the Emergency Department Physician who either signs or Co-signs this chart in the absence of a cardiologist.        RADIOLOGY:   Non-plain  film images such as CT, Ultrasound and MRI are read by the radiologist. Plain radiographic images are visualized and preliminarily interpreted by the emergency physician with the below findings:        Interpretation per the Radiologist below, if available at the time of this note:    US ABDOMEN LIMITED Specify organ? GALLBLADDER   Final Result      1. Cirrhotic liver.   2. No acute abnormality.   3. No biliary ductal dilatation.      XR CHEST PORTABLE   Final Result   Hazy  density in the mid to lower left hemithorax which may be due to   atelectasis.  There may be a small left-sided pleural effusion.         CT CHEST WO CONTRAST    (Results Pending)        LABS:  Labs Reviewed   RAPID INFLUENZA A/B ANTIGENS - Abnormal; Notable for the following components:       Result Value    Influenza A Ag Positive (*)     All other components within normal limits   CBC WITH AUTO DIFFERENTIAL - Abnormal; Notable for the following components:    RBC 3.19 (*)     Hemoglobin 9.2 (*)     Hematocrit 27.0 (*)     RDW 18.9 (*)     Platelets 124 (*)     Neutrophils % 76 (*)     Immature Granulocytes 1 (*)     Neutrophils Absolute 8.3 (*)     Absolute Immature Granulocyte 0.1 (*)     All other components within normal limits   COMPREHENSIVE METABOLIC PANEL - Abnormal; Notable for the following components:    Anion Gap 3 (*)     Bun/Cre Ratio 9 (*)     Est, Glom Filt Rate 58 (*)     Calcium 7.9 (*)     Total Bilirubin 3.2 (*)     Alk Phosphatase 130 (*)     Total Protein 5.3 (*)     Albumin 2.7 (*)     Albumin/Globulin Ratio 1.0 (*)     All other components within normal limits   LACTIC ACID - Abnormal; Notable for the following components:    Lactic Acid, Plasma 3.1 (*)     All other components within normal limits   LACTIC ACID - Abnormal; Notable for the following components:    Lactic Acid, Plasma 3.1 (*)     All other components within normal limits   BASIC METABOLIC PANEL W/ REFLEX TO MG FOR LOW K - Abnormal; Notable for the following components:    Glucose 62 (*)     Creatinine 1.40 (*)     Bun/Cre Ratio 10 (*)     Est, Glom Filt Rate 52 (*)     Calcium 7.8 (*)     All other components within normal limits   MAGNESIUM - Abnormal; Notable for the following components:    Magnesium 1.5 (*)     All other components within normal limits   PHOSPHORUS - Abnormal; Notable for the following components:    Phosphorus 0.8 (*)     All other components within normal limits   CBC WITH AUTO DIFFERENTIAL - Abnormal;  Notable for the following components:    WBC 12.2 (*)     RBC 2.98 (*)     Hemoglobin 8.6 (*)     Hematocrit 25.0 (*)     RDW 19.2 (*)  Platelets 107 (*)     Neutrophils Absolute 9.4 (*)     All other components within normal limits   PROTIME-INR - Abnormal; Notable for the following components:    INR 2.2 (*)     Protime 21.9 (*)     All other components within normal limits   POCT GLUCOSE - Abnormal; Notable for the following components:    POC Glucose 126 (*)     All other components within normal limits   CULTURE, BLOOD 1   CULTURE, BLOOD 2   COVID-19, RAPID   EXTRA TUBES HOLD   LACTIC ACID   URINALYSIS WITH REFLEX TO CULTURE   LACTIC ACID   LACTIC ACID   POCT GLUCOSE   POCT GLUCOSE   POCT GLUCOSE   POCT GLUCOSE       All other labs were within normal range or not returned as of this dictation.    EMERGENCY DEPARTMENT COURSE and DIFFERENTIAL DIAGNOSIS/MDM:   Vitals:    Vitals:    10/01/22 0800 10/01/22 0900 10/01/22 1108 10/01/22 1121   BP: 93/75 (!) 108/95     Pulse: 89 (!) 108 94    Resp: _0 Temp: 98.2 F (36.8 C)      TempSrc: Oral      SpO2:  97% 96%    Weight:       Height:    1.727 m (5' 7.99")           Medical Decision Making  DDx: Sepsis, viral URI, COVID, pneumonia, UTI, viral gastroenteritis, fever of unknown origin    Plan:  - Labs: CBC, CMP, troponin, urinalysis, cultures of urine and blood  - Imaging: CXR  - Medications: IVF, midodrine    Code Sepsis Reassessment & Plan   Sepsis order set entered: No  Broad Spectrum Antibiotics given: none, considered likely viral  Repeat lactic acid ordered for time: 1830   Re-assessment performed at time 1700 and clinical condition gradually worsening.  Actions taken: initiate vasopressors.   Hypotension or Lactic Acidosis present (SBP<90, MAP<65, Lactate >4): Yes  IVF: Limited IVF given (1L bolus) because patient has shortness of breath with minimal exertion  Persistent Septic Shock present (Hypotension despite IVF resuscitation): Yes Vasopressors:  Norepinephrine  Disposition: Admit to ICU       Reassessment:  Patient's blood pressures were 80s over 50s for the first hour of his ED course, had a difficult time getting access but central line now placed and pressures improved to 110/70 but subsequently dropped again to MAPs in the 50s, so he was started on norepinephrine infusion.  He is also requiring supplemental oxygen, satting in the mid 80s on room air, only to 90% or so maximized on nonrebreather.  High flow oxygen was ordered.  Work-up notable for positive influenza swab and hyperbilirubinemia.  Right upper quadrant ultrasound ordered.  Given his hypoxic respiratory failure requiring high flow and need for blood pressure support, ICU was consulted and ultimately accepted the patient to their service.      Perfect Serve Consult for Admission  6:23 PM    ED Room Number: ER29/29  Patient Name and age:  Jaime [name] 76 y.o.  male  Working Diagnosis: Influenza, hypotension, hyperbilirubinemia    COVID-19 Suspicion: No  Sepsis present:  No  Reassessment needed: Yes  Code Status:  Full Code  Readmission: Yes  Isolation Requirements: yes - airborne  Recommended Level of Care: step down  Department: [facility] Adult ED - (  [phone]    Other:  76 year old male with PMHx of ESRD on HD (MWF at [place] home), acute proctitis with rectal bleeding requiring admission with discharge from [facility] 1 week ago, anemia, DM 2, PVD, liver abscess, pancreatitis, and chronic sacral ulcer presents to the emergency department from his nursing home for evaluation of hypotension.  Patient's blood pressures were 80s over 50s for the first hour of his ED course, had a difficult time getting access but central line now placed and pressures have improved to 110/70.  He is also requiring supplemental oxygen, satting in the mid 80s on room air and around 90 with nasal cannula.  Now on nonrebreather.  Work-up notable for positive influenza swab and hyperbilirubinemia.  Right  upper quadrant ultrasound ordered.  Would like to admit for management of hypotension.    Total critical care time spent exclusive of procedures:  45 minutes.         Amount and/or Complexity of Data Reviewed  Labs: ordered.  Radiology: ordered.  ECG/medicine tests: ordered.    Risk  Prescription drug management.  Drug therapy requiring intensive monitoring for toxicity.  Decision regarding hospitalization.            REASSESSMENT     ED Course as of 10/01/22 1224   Wed Sep 30, 2022   2321 This EKG was interpreted by me at 430.  Sinus tachycardia at 105 bpm.  Normal axis.  No significant ST segment abnormalities [JT]      ED Course User Index  [JT] Tamsen Meek, MD           CONSULTS:  IP WOUND CARE NURSE CONSULT TO EVAL    PROCEDURES:  Unless otherwise noted below, none     Central Line    Date/Time: 10/01/2022 12:29 PM    Performed by: Tamsen Meek, MD  Authorized by: Tamsen Meek, MD    Consent:     Consent obtained:  Verbal    Risks, benefits, and alternatives were discussed: yes      Alternatives discussed:  Alternative treatment  Universal protocol:     Procedure explained and questions answered to patient or proxy's satisfaction: yes    Pre-procedure details:     Indication(s): central venous access, hemodynamic monitoring and insufficient peripheral access      Sterile barrier technique: All elements of maximal sterile technique followed      Skin preparation:  Chlorhexidine  Anesthesia:     Anesthesia method:  Local infiltration    Local anesthetic:  Lidocaine 1% w/o epi  Procedure details:     Location:  R femoral    Patient position:  Supine    Procedural supplies:  Triple lumen    Ultrasound guidance: yes      Number of attempts:  2    Successful placement: yes    Post-procedure details:     Post-procedure:  Dressing applied and line sutured    Assessment:  Blood return through all ports and free fluid flow    Procedure completion:  Tolerated well, no immediate complications        FINAL IMPRESSION       1. Hypoxia    2. Influenza    3. Hyperbilirubinemia          DISPOSITION/PLAN   DISPOSITION        PATIENT REFERRED TO:  No follow-up provider specified.    DISCHARGE MEDICATIONS:  New Prescriptions    No medications on file         (  Please note that portions of this note were completed with a voice recognition program.  Efforts were made to edit the dictations but occasionally words are mis-transcribed.)    Tamsen Meek, MD (electronically signed)  Emergency Attending Physician / Physician Assistant / Nurse Practitioner            Tamsen Meek, MD  10/01/22 1231       Tamsen Meek, MD  10/01/22 1239

## 2022-09-30 NOTE — ED Notes (Signed)
RN called code sepsis at 1931.     Belenda Cruise, RN  10/19/2022 1931

## 2022-09-30 NOTE — Discharge Summary (Signed)
Discharge Summary       PATIENT ID: Jaime [name]  MRN: [id number]   DATE OF BIRTH: 19-Sep-1946    DATE OF ADMISSION: 23-Oct-2022  2:38 PM    DATE OF DISCHARGE: 09/06/2022    PRIMARY CARE PROVIDER: No primary care provider on file.     ATTENDING PHYSICIAN: No att. providers found    DISCHARGING PROVIDER: Shon Hough, MD    To contact this individual call [phone] and ask the operator to page.  If unavailable ask to be transferred the Adult Hospitalist Department.    CONSULTATIONS: IP CONSULT TO HOSPITALIST  IP CONSULT TO GI  IP WOUND CARE NURSE CONSULT TO EVAL  IP CONSULT TO NEPHROLOGY  IP CONSULT TO DIETITIAN  IP CONSULT TO INTENSIVIST  IP CONSULT TO PHARMACY    PROCEDURES/SURGERIES: * No surgery found *    ADMITTING DIAGNOSES & HOSPITAL COURSE:     76 y.o. male with hx of dm ii, htn, esrd on hd, pvd s/p bilateral TMA, hx of shock 2/2 perforated viscus, liver abscess s/p ir drainage, sacral ulcer who presented to ed with complaints of rectal bleeding. Patient states he had single episode of rectal bleeding which prompted him to seek ED care.  He denies any previous history of hemorrhoids.  Diverticulosis The patient denies any fever, chills, chest or abdominal pain, nausea, vomiting, cough, congestion, recent illness, palpitations, or dysuria. Remarkable vitals on ER Presentation: vss Labs were remarkable for: wbc 17.6, alb 1.8, cr 2.23. CT abd/pel showed probable proctitis.  Patient was treated with IV levaquin and Flagyl. Patient was seen by GI. There was no acute plan for endoscopy given patient's stable Hb and low platelets. Patient may require colonoscopy outpatient if platelets improved. No evidence of further rectal bleed noted at discharge. Thrombocytopenia likely related to acute illness. Patient also had hypoxia which was likely due to volume overload and NOT pneumonia. Patient received HD on MWF and will continue with same. Patient respiratory status is stable on 2L with O2 sat 97%. Patient  also developed hypotension. 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 Midodrine was increased to 15mg  TID and was given IV albumin. Patient's condition is not stable and safe for discharge to SNF.         DISCHARGE DIAGNOSES / PLAN:       Increased mododrine to 15mg  tid- DO NOT hold on day of dialysis   Resume PT/OT  Dialysis on MWF         FOLLOW UP APPOINTMENTS:    @DCFOLLOWUP @       DIET: renal diet    OXYGEN / BiPAP SETTINGS: 2L    ACTIVITY: activity as tolerated      DISCHARGE MEDICATIONS:   See Medication Reconciliation Form      NOTIFY YOUR PHYSICIAN FOR ANY OF THE FOLLOWING:   Fever over 101 degrees for 24 hours.   Chest pain, shortness of breath, fever, chills, nausea, vomiting, diarrhea, change in mentation, falling, weakness, bleeding. Severe pain or pain not relieved by medications.  Or, any other signs or symptoms that you may have questions about.    DISPOSITION:    Home With:   OT  PT  HH  RN      X SNF/Inpatient Rehab/LTAC    Independent/assisted living    Hospice    Other:       PATIENT CONDITION AT DISCHARGE:     Functional status   X Poor     Deconditioned  Independent      Cognition   X  Lucid     Forgetful     Dementia      Catheters/lines (plus indication)    Foley     PICC     PEG     None      Code status   X  Full code     DNR          DISCHARGE MEDICATIONS:     Medication List        START taking these medications      acetaminophen 325 MG tablet  Commonly known as: TYLENOL     levoFLOXacin 500 MG tablet  Commonly known as: LEVAQUIN  Take 1 tablet by mouth every 48 hours for 4 days     metroNIDAZOLE 500 MG tablet  Commonly known as: FLAGYL  Take 1 tablet by mouth 2 times daily for 3 days            CHANGE how you take these medications      midodrine 10 MG tablet  Commonly known as: PROAMATINE  Take 1.5 tablets by mouth 3 times daily  What changed:   medication strength  how much to take  when to take this  reasons to take this            CONTINUE taking these medications       mirtazapine 7.5 MG tablet  Commonly known as: REMERON  Take 1 tablet by mouth nightly     sevelamer 2.4 g Pack packet  Commonly known as: RENVELA  Take 2.4 g by mouth 3 times daily (with meals)     zinc sulfate 220 (50 Zn) MG 220 mg capsule - elemental zinc  Commonly known as: ZINCATE            STOP taking these medications      amiodarone 200 MG tablet  Commonly known as: CORDARONE     traMADol 50 MG tablet  Commonly known as: ULTRAM               Where to Get Your Medications        These medications were sent to [facility] [address], [place] - [address] - P [phone] - F [phone]  [address], [place] [address]      Phone: [phone]   levoFLOXacin 500 MG tablet  metroNIDAZOLE 500 MG tablet  midodrine 10 MG tablet         PHYSICAL EXAMINATION AT DISCHARGE:    General : alert x 3, awake, no acute distress,   HEENT: bilateral horizontal nystagmus   Chest: Clear to auscultation bilaterally   CVS: S1 S2 heard, Capillary refill less than 2 seconds  Abd: soft/ Non tender, non distended, BS physiological,   Ext: bilateral TMA  Neuro: AO3, motor in UE normal   Skin: warm     CHRONIC MEDICAL DIAGNOSES:      Greater than 31 minutes were spent with the patient on counseling and coordination of care    Signed:   Penelope Galas, MD  10/18/2022  5:37 PM

## 2022-09-30 NOTE — H&P (Signed)
Name: Jaime [name]   DOB: [birth year]   MRN: [id number]   Date: 17-Oct-2022      REASON FOR ICU ADMISSION:  Hypotension. Influenza A +     PRINCIPLE ICU DIAGNOSIS   Hypotension   Sepsis - Influenza A vs Proctitis  Thrombocytopenia  Hypoxia on HFNC  ESRD - HD MWF  PVD s/p toe amputation left/right foot  DM2  Chronic sacral ulcer   Left heel ulcer    PATIENT SUMMARY   Jaime [name] is a 76 y.o. male with PMHx of ESRD on HD (MWF at [facility]), acute proctitis with rectal bleeding requiring admission from [facility] 1 week ago and discharged on 10-17-22, anemia, DM 2, PVD, liver abscess, pancreatitis, and chronic sacral ulcer who presented to the emergency department from his nursing home for evaluation of hypotension. Patient was being discharged to SNF and on way to facility, per EMS, pressures were 80s over 50s on their arrival. They brought him back to [facility] ED for evaluation of hypotension. Patient's blood pressures were 80s over 50s in the ED. A femoral central line was placed and patient was started on levophed. He is also required supplemental oxygen, satting in the mid 80s on room air and around 90 with nasal cannula.  Now on nonrebreather.  Work-up notable for positive influenza swab and hyperbilirubinemia.  Right upper quadrant ultrasound ordered. Critical care consulted for ICU admission.       COMPREHENSIVE ASSESSMENT & PLAN:SYSTEM BASED     24 HOUR EVENTS: as above.    NEUROLOGICAL:   Analgesia: None  Sedation: None  Neuro check Frequency: Per unit protocol      PULMONOLOGY:   Respiratory Goals: Place on HFNC.  Influenza A positive - start on Tamiflu 75 mg x 1 now and then 30 mg BID for decreased creatinine clearance.  PRN ABG for respiratory distress.     CARDIOVASCULAR:   SB/P Goal of: greater than 90 mmHg  MAP Goal of: greater than 65 mmHg  Levophed - For above SBP/MAP goals  IVF: None  LR bolus - 500 cc now  Chronic hypotension on midodrine. Restart. 15mg  PO q 8hrs.      GASTROINTESTINAL   Diet/Feeding: easy to chew diet   PPI  If patient has drop in H/H or starts to have GI bleed consult GI.   Elevated bilirubin. US abdomen ordered.    RENAL/ELECTROLYTE/FLUIDS:   Keep K>4; Mg>2    Trend renal indices/ [facility] for HD management.     ENDOCRINE:   Glycemic Control: Goal 120-180: SSI PRN  Prevent hypoglycemia    HEMATOLOGY/ONCOLOGY:   Transfusion Trigger (Hgb): 7.0  Trend CBC    INFECTIOUS DISEASE:   Would care for sacral and heel ulcer  Tamiflu for Influenza A  Continue antibiotics for proctitis  Follow cultures   Trend lactic acid.    ANTIBIOTICS TO DATE:   11/29: Flagyl and Levaquin PO restarted from last admission     CULTURES TO DATE:   11/29: Blood Cultures  11/29 : Influenza A positive    ICU DAILY CHECKLIST   Code Status: Full code  DVT Prophylaxis:SCDs  T/L/D: Tubes: None  Lines: PIVs, CVL right femoral    Drains: None  SUP: Protonix  Diet: Easy to chew  Activity Level: Bedrest  ABCDEF Bundle/Checklist Completed: Yes  Disposition: Stay in ICU   Multidisciplinary Rounds Completed: No  Patient/Family Updated: Pending  Goals of Care Discussion/Palliative: NA  Feeding Tube:                        Sheath:                          Infusion Wire/Catheter:      Peripherally Inserted Central Catheter:     Central Venous Catheter:    CVC Triple Lumen 09/27/2022 Right Femoral (Active)     Venous Access Device:     Peripheral Intravenous Line:    Peripheral IV 09/26/22 Right Arm (Active)   Site Assessment Clean, dry & intact 09/29/2022 0800   Line Status Flushed;Infusing 09/07/2022 0800   Line Care Connections checked and tightened 09/20/2022 0800   Phlebitis Assessment No symptoms 09/27/2022 0800   Infiltration Assessment 0 09/15/2022 0800   Alcohol Cap Used Yes 09/06/2022 0800   Dressing Status Clean, dry & intact 09/15/2022 0800   Dressing Type Transparent 09/24/2022 0800     Arterial Line:     Hemodialysis Catheter:     Drain(s):     Airway:     Intraosseous  Line:     Epidural:     Atrial Line:     Cervical Ripening Balloon:       HOSPITAL COURSE/DAILY EVENT LOG   11/29 - admit to ICU, on vasopressors    SUBJECTIVE   ROS:  A comprehensive review of systems was negative except for: sacral pain    OBJECTIVE   Labs and Data: Reviewed 09/08/2022  Medications: Reviewed 09/02/2022  Imaging: Reviewed 09/18/2022    Physical Exam:  General appearance - acyanotic, in no respiratory distress  Mental status - alert, oriented to person, place, and time, affect appropriate to mood  Eyes - pupils equal and reactive, extraocular eye movements intact, sclera anicteric  Mouth - mucous membranes moist  Neck - supple, no significant adenopathy  Chest - no tachypnea, retractions or cyanosis  Heart - tachycardic rate and regular rhythm  Abdomen - soft, nontender, nondistended, no masses or organomegaly  Neurological - alert, oriented, normal speech  Musculoskeletal - no joint or muscular tenderness noted. Bilateral toes amputated.   Extremities - no pedal edema noted, intact peripheral pulses  Skin - sacral ulcer, foot amputation wounds approximated. Left heel ulcer.    BP (!) 105/59   Pulse (!) 102   Temp 98.3 F (36.8 C) (Axillary)   Resp 27   SpO2 99%      Temp (24hrs), Avg:98 F (36.7 C), Min:97.8 F (36.6 C), Max:98.3 F (36.8 C)           Intake/Output:   No intake or output data in the 24 hours ending 09/29/2022 2039    Imaging:    Chest x-ray 09/2022  IMPRESSION:  Hazy density in the mid to lower left hemithorax which may be due to  atelectasis.  There may be a small left-sided pleural effusion.    Echo:  No valid procedures specified.     CRITICAL CARE DOCUMENTATION  I had a face to face encounter with the patient, reviewed and interpreted patient data including clinical events, labs, images, vital signs, I/O's, and examined patient.  I have discussed the case and the plan and management of the patient's care with the consulting services, the bedside nurses and the respiratory  therapist.      NOTE OF PERSONAL INVOLVEMENT IN CARE   This patient has a high probability of imminent, clinically significant deterioration, which  requires the highest level of preparedness to intervene urgently. I participated in the decision-making and personally managed or directed the management of the following life and organ supporting interventions that required my frequent assessment to treat or prevent imminent deterioration.    I personally spent 40 minutes of critical care time.  This is time spent at this critically ill patient's bedside actively involved in patient care as well as the coordination of care.  This does not include any procedural time which has been billed separately.    Jeanell Sparrow, NP-C    Critical Care Medicine  [employer]

## 2022-09-30 NOTE — ED Triage Notes (Signed)
Pt comes in from [facility] with CC of hypotension. Pt was DC today with a rectal bleed.

## 2022-09-30 NOTE — Progress Notes (Addendum)
TRANSFER - IN REPORT:    Verbal report received from Shawn, RN (name) on Livan Hires  being received from ED (unit) for routine progression of patient care      Report consisted of patient's Situation, Background, Assessment and   Recommendations(SBAR).     Information from the following report(s) Nurse Handoff Report, Index, Intake/Output, MAR, Recent Results, and Cardiac Rhythm ST  was reviewed with the receiving nurse.    Opportunity for questions and clarification was provided.      Assessment completed upon patient's arrival to unit and care assume    2200 Pt arrived on unit. Levo @ 5. Pt on high flow. CHG bath, new gown & linens. Sacral dressing applied. Incontinence care for BM

## 2022-09-30 NOTE — Other (Signed)
09/29/2022 0555   Vital Signs   BP 92/63   Temp 98 F (36.7 C)   Pulse 72   Respirations 21   SpO2 98 %   Weight - Scale 80.6 kg (177 lb 11.1 oz)   Weight Method Bed scale   Percent Weight Change -4.11   Pain Assessment   Pain Assessment None - Denies Pain   Treatment   Time On 0600   Treatment Goal 500   Observations & Evaluations   Level of Consciousness 0   Oriented X 4   Heart Rhythm Regular   Respiratory Quality/Effort Labored   O2 Device Nasal cannula   Bilateral Breath Sounds Diminished   Skin Color Pale   Skin Condition/Temp Dry;Fragile   Abdomen Inspection Soft   Edema Generalized   Edema Generalized Trace   RUE Edema +1   LUE Edema +1   Technical Checks   Dialysis Machine No. [id number]   RO Machine Number [id number]   Dialyzer Lot No. [id number]   Tubing Lot Number [id number]   All Connections Secure Yes   NS Bag Yes   Saline Line Double Clamped Yes   Dialyzer Revaclear 300   Prime Volume (mL) 200 mL   ICEBOAT I;C;B;E;O;A;T   RO Machine Log Sheet Completed Yes   Machine Alarm Self Test Completed;Passed   Materials engineer Function   Extracorporeal Advertising copywriter Conductivity 13.9   Manual Conductivity 14   Manual Ph 7.4   Bleach Test (Neg) Yes   Bath Temperature 96.8 F (36 C)   Treatment Initiation   Dialyze Hours 3.5   Treatment  Initiation Universal Precautions maintained;Lines secured to patient;Connections secured;Prime given;Venous Parameters set;Arterial Parameters set;Chiropodist engaged   Dialysis Bath   K+ (Potassium) 3   Ca+ (Calcium) 2.5   Na+ (Sodium) 138   HCO3 (Bicarb) 38     Primary RN SBAR: [place] RN  Patient Education: yes, r/t dialysis process  Hepatitis B Surface Ag   Date/Time Value Ref Range Status   07/19/2022 06:10 PM <0.10 Index Final     Hep B S Ab   Date/Time Value Ref Range Status   06/29/2022 09:53 AM <3.10 mIU/mL Final

## 2022-09-30 NOTE — ED Notes (Signed)
Bedside shift change report given to Shawn RN (oncoming nurse) by Stiles Maxcy RN (offgoing nurse). Report included the following information ED SBAR, Adult Overview, MAR, and Recent Results.      Latasha Puskas, RN  09/17/2022 1951

## 2022-09-30 NOTE — Plan of Care (Signed)
Problem: Physical Therapy - Adult  Goal: By Discharge: Performs mobility at highest level of function for planned discharge setting.  See evaluation for individualized goals.  Description: FUNCTIONAL STATUS PRIOR TO ADMISSION: Patient was independent prior to the end of July when he was admitted to [facility] with a perforated bowel. Had two month admission with multiple complications including sacral wounds and bilateral transmetatarsal amputations, + new HD dependency. Discharged to [facility] for rehab where he has been for two months. States he is hoyer lifted to w/c but is not yet standing or ambulating. Of note, he was heel WB bilaterally for transfers only during his [facility] admission (unclear if his precautions remain at this time).     HOME SUPPORT PRIOR TO ADMISSION: The patient lived with his wife in [place], [place] prior to admission to [facility] (was in town for the [place]).    Physical Therapy Goals  Initiated 09/27/2022  1.  Patient will move from supine to sit and sit to supine, scoot up and down, and roll side to side in bed with moderate assistance within 7 day(s).    2.  Patient will sit EOB x5 min with moderate assist x2 within 7 days.   3.  Patient will participate in supine HEP with minimal assistance within 7 days.   4.  Patient will tolerate OOB to chair via hoyer lift x60 min within 7 days.     Outcome: Not Progressing   PHYSICAL THERAPY TREATMENT    Patient: Jaime [name] (76 y.o. male)  Date: 09/25/2022  Diagnosis: Hemorrhage of rectum and anus [K62.5]  Proctitis [K62.89]  Skin ulcer of sacrum, unspecified ulcer stage ([place]) [L98.429] Proctitis      Precautions:  (sacral wounds)  contact    ASSESSMENT:  Patient continues to benefit from skilled PT services and is not progressing meaningfully towards goals today. Pt cleared by RN and pt resting in bed with NAD, BP soft. Pt agreeable to bed/supine therex for increased strength, circulation, and DVT prevention. Pt reports pain in sacrum though BLEs  tolerable. During course of exercises, pt's BP noted to drop again (after brief elevation during PCT vitals):     09/17/2022 1200   Vital Signs   Pulse 89   BP (!) 90/54   MAP (Calculated) 66   BP Location Left upper arm   BP Method Automatic   Patient Position Semi fowlers  (post supine therex)     Pt's activity aborted due to c/o lightheadedness and associated hypotension. Pt planned for possible discharge to SNF today and was left with all needs met as able.       PLAN:  Patient continues to benefit from skilled intervention to address the above impairments.  Continue treatment per established plan of care.    Recommendation for discharge: (in order for the patient to meet his/her long term goals): Therapy up to 5 days/week in Skilled nursing facility    Other factors to consider for discharge: patient's current support system is unable to meet their requirements for physical assistance, not safe to be alone, concern for safely navigating or managing the home environment, and new weight bearing restrictions limiting activity or patient is unable to maintain    IF patient discharges home will need the following DME:  extensive DME and physical assistance; medical transport       SUBJECTIVE:   Patient stated, "The pain in my backside..."    OBJECTIVE DATA SUMMARY:   Critical Behavior:   Calm, alert, agreeable  Therapeutic Exercises:     EXERCISE   Sets   Reps   Active Active Assist   Passive Self ROM   Comments   Ankle Pumps 1 10 _0  _1  _2  _3  Performed with pillow rolls under calfs   Quad Sets 1 10 _4  _5  _6  _7  Performed with propped extension   Gluteal Sets 1 10 _8  _9  _10  _11     Short Arc Quads 1 5 _12  _13  _14  _15  Pt with quick fatigue     *Pt required rest breaks between activity    Pain Rating:  No VAS obtained   Pain Intervention(s):   repositioning and BLE therex for circulation and pressure relief    Activity Tolerance:   Poor, requires rest breaks, requires frequent rest breaks, observed shortness of breath on  exertion, desaturates with activity and requires oxygen, and signs and symptoms of orthostatic hypotension    After treatment:   Patient left in no apparent distress in bed, Call bell within reach, Side rails x3, and Heels elevated for pressure relief      COMMUNICATION/EDUCATION:   The patient's plan of care was discussed with: registered nurse    Patient Education  Education Given To: Patient  Education Provided: Role of Therapy;Plan of Care;Home Exercise Program  Education Provided Comments: Pt educated on bed therex and limited activity due to drop in BP w/ + c/o lightheadedness  Education Method: Demonstration;Verbal  Barriers to Learning: None  Education Outcome: Continued education needed      Reader, PT  Minutes: 16

## 2022-09-30 NOTE — Progress Notes (Signed)
RT note:  HFNC Set up     10-24-22 2025   Oxygen Therapy/Pulse Ox   O2 Device Heated high flow cannula   O2 Flow Rate (L/min) 40 L/min   FiO2  70 %   Pulse (!) 103   Respirations 30   SpO2   (UNABLE TO OBTAIN)   Humidification Source Heated wire   Humidification Temp 34   Humidification Temp Measured 34   Blood Gas  Performed? No

## 2022-09-30 NOTE — Progress Notes (Signed)
Sepsis Reassessment    Name: Jaime [name]   DOB: 09/26/1946   MRN: [id number]   Date: 09/10/2022   Time:       11:31 PM    Completed physical exam: yes    Latest lactic acid:   Lab Results   Component Value Date/Time    LACACIDPL 3.1 (HH) 09/03/2022 06:30 PM      Lab Results   Component Value Date/Time    POCLACTIC 0.46 09/26/2022 09:31 AM       Latest procalcitonin:   Lab Results   Component Value Date/Time    PROCAL 0.62 09/28/2022 12:53 PM       Most Recent Vital Signs:    BP 99/60   Pulse 95   Temp 98 F (36.7 C) (Axillary)   Resp 21   Wt 80.2 kg (176 lb 11.2 oz)   SpO2 97%   BMI 26.87 kg/m               Patient Vitals for the past 8 hrs:   Temp Pulse Resp BP SpO2   09/10/2022 2315 -- 95 21 99/60 97 %   09/26/2022 2300 -- 92 22 (!) 102/53 97 %   09/22/2022 2245 -- 92 19 101/60 97 %   09/20/2022 2230 -- 97 23 (!) 104/54 --   09/19/2022 2215 -- (!) 109 19 108/79 --   09/03/2022 2210 98 F (36.7 C) (!) 107 28 (!) 104/56 95 %   09/25/2022 2025 -- (!) 103 30 -- --   09/12/2022 2015 -- (!) 102 27 (!) 105/59 99 %   09/18/2022 2000 -- 98 (!) 34 (!) 102/55 --   09/29/2022 1945 -- (!) 108 (!) 35 (!) 91/52 --   09/16/2022 1930 -- (!) 101 (!) 32 (!) 91/55 --   09/04/2022 1919 -- 100 -- -- --   09/18/2022 1915 -- (!) 104 27 (!) 93/56 (!) 83 %   09/03/2022 1900 -- (!) 107 23 (!) 90/49 (!) 79 %   09/08/2022 1845 -- (!) 112 28 (!) 93/52 (!) 82 %   09/05/2022 1830 -- (!) 105 23 109/83 (!) 78 %   09/25/2022 1815 -- (!) 103 24 (!) 93/53 (!) 79 %   09/13/2022 1800 -- (!) 109 30 (!) 88/51 91 %   09/04/2022 1745 -- (!) 104 21 (!) 79/49 (!) 60 %   09/06/2022 1730 -- (!) 108 (!) 31 (!) 82/54 (!) 69 %   09/11/2022 1715 -- (!) 105 25 (!) 85/60 91 %   09/24/2022 1700 -- (!) 105 28 (!) 74/49 94 %   09/19/2022 1645 -- (!) 111 26 (!) 58/40 94 %   09/26/2022 1630 -- (!) 107 (!) 32 (!) 75/52 96 %   09/20/2022 1620 98.3 F (36.8 C) (!) 106 25 (!) 80/54 92 %         Physical Examination: See H/P note.  Peripheral Pulse: Bilateral, DP, 1+   Capillary refill: normal     Completed  IVF Resuscitation (32ml/kg) -  no Dialysis pt. Given 500 cc bolus x 1    IVF choice: LR    Suspected source/s of severe sepsis: Proctitis vs influenza A    Organ dysfunction:  yes; Hypotension     Antibiotics: vancomycin, Levaquin, and metronidazole    Vasopressors started:  yes      Meyer Russel, NP-C

## 2022-09-30 NOTE — ED Notes (Signed)
RN called respiratory to place pt on high flow per MD Berline Lopes.      Belenda Cruise, RN  09/27/2022 1924

## 2022-09-30 NOTE — Discharge Instructions (Addendum)
Discharge SNF/Rehab Instructions/LTAC       PATIENT ID: Jaime [name]  MRN: [id number]   DATE OF BIRTH: [birth year]    DATE OF ADMISSION: @ADMITDTTM @    DATE OF DISCHARGE: 09/26/2022    PRIMARY CARE PROVIDER: @PCP @       ATTENDING PHYSICIAN: @ATTPROV @  DISCHARGING PROVIDER: 10/02/2022, MD     To contact this individual call [phone] and ask the operator to page.   If unavailable ask to be transferred the Adult Hospitalist Department.    CONSULTATIONS: @CONORDS @    PROCEDURES/SURGERIES: * No surgery found *    ADMITTING DIAGNOSES & HOSPITAL COURSE:   76 y.o. male with hx of dm ii, htn, esrd on hd, pvd s/p bilateral TMA, hx of shock 2/2 perforated viscus, liver abscess s/p ir drainage, sacral ulcer who presented to ed with complaints of rectal bleeding. Patient states he had single episode of rectal bleeding which prompted him to seek ED care.  He denies any previous history of hemorrhoids.  Diverticulosis The patient denies any fever, chills, chest or abdominal pain, nausea, vomiting, cough, congestion, recent illness, palpitations, or dysuria. Remarkable vitals on ER Presentation: vss Labs were remarkable for: wbc 17.6, alb 1.8, cr 2.23. CT abd/pel showed probable proctitis.  Patient was treated with IV levaquin and Flagyl. Patient also had hypoxia which was likely due to volume overload and NOT pneumonia. Patient received HD on MWF and will continue with same. Patient respiratory status is stable on 2L with O2 sat 97%. Patient also developed hypotension. 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 Midodrine was increased to 15mg  TID and was given IV albumin. Patient's condition is not stable and safe for discharge to SNF.         DISCHARGE DIAGNOSES / PLAN:       Increased mododrine to 15mg  tid- DO NOT hold on day of dialysis   Resume PT/OT  Dialysis on MWF         FOLLOW UP APPOINTMENTS:    @DCFOLLOWUP @       DIET: renal diet    OXYGEN / BiPAP SETTINGS: 2L    ACTIVITY: activity as  tolerated      DISCHARGE MEDICATIONS:   See Medication Reconciliation Form      NOTIFY YOUR PHYSICIAN FOR ANY OF THE FOLLOWING:   Fever over 101 degrees for 24 hours.   Chest pain, shortness of breath, fever, chills, nausea, vomiting, diarrhea, change in mentation, falling, weakness, bleeding. Severe pain or pain not relieved by medications.  Or, any other signs or symptoms that you may have questions about.    DISPOSITION:    Home With:   OT  PT  HH  RN      X SNF/Inpatient Rehab/LTAC    Independent/assisted living    Hospice    Other:       PATIENT CONDITION AT DISCHARGE:     Functional status   X Poor     Deconditioned     Independent      Cognition   X  Lucid     Forgetful     Dementia      Catheters/lines (plus indication)    Foley     PICC     PEG     None      Code status   X  Full code     DNR          CHRONIC MEDICAL DIAGNOSES:  @RPROB @  Signed:   Shon Hough, MD  09/20/2022  1:35 PM

## 2022-09-30 NOTE — ED Notes (Addendum)
Kyung Rudd reported of Lactic 3.1     Mack Guise, RN  09/19/2022 2046       Mack Guise, RN  09/25/2022 2053

## 2022-09-30 NOTE — Care Coordination-Inpatient (Addendum)
Transition of Care Plan to SNF/Rehab    Communication to Patient/Family:  Met with patient and family and they are agreeable to the transition plan. The Plan for Transition of Care is related to the following treatment goals: Return to [place] with [facility] transport at 2pm. RN to call report to: [phone] for room: 108A  CM called and notified the patient's spouse Hikaru Delorenzo and she agrees with plan.     The Patient and/or patient representative was provided with a choice of provider and agrees  with the discharge plan.      Yes _0  No _1     A Freedom of choice list was provided with basic dialogue that supports the patient's individualized plan of care/goals and shares the quality data associated with the providers.       Yes _2  No _3     SNF/Rehab Transition:  Patient has been accepted to [facility]/Rehab and meets criteria for admission.   [facility]    Communication to SNF/Rehab:  Bedside RN has been notified to update the transition plan to the facility and call report (phone number).  Discharge information has been updated on the AVS. And communicated to facility via [employer], or CC link.       Nursing Please include all hard scripts for controlled substances, med rec and dc summary, and AVS in packet.     Reviewed and confirmed with facility, Pam can manage the patient care needs for the following:     [place] with (X) only those applicable:  Medication:  _4 Medications are available at the facility  _5 IV Antibiotics    _6 Controlled Substance - hard copies available sent.  _7 Weekly Labs    Equipment:  _8 CPAP/BiPAP  _9 Wound Vacuum  _10 Foley or Urinary Device  _11 PICC/Central Line  _12 Nebulizer  _13 Ventilator    Treatment:  _14 Isolation (for MRSA, VRE, etc.)  _15 Surgical Drain Management  _16 Tracheostomy Care  _17 Dressing Changes  _18 Dialysis with transportation  _19 PEG Care  _20 Oxygen  _21 Daily Weights for Heart Failure     Dietary:  _22 Any diet limitations  _23 Tube Feedings   _24 Total Parenteral Management (TPN)    Financial Resources:  <[id number]>_6<\/[id number]>_39 Medicaid Application Completed    _26 UAI Completed and copy given to pt/family  and copy given to pt/family  _27 A screening has previously been completed.    _28 Level II Completed    _29  Private pay individual who will not become   financially eligible for Medicaid within 6 months from admission to a [facility] facility.     _30  Individual refused to have screening conducted.     <[id number]>_3<\/[id number]>_87 Medicaid Application Completed    _32 The screening denied because it was determined individual did not need/did not qualify for nursing facility level of care.  _33  Out of state residents seeking direct admission to a [facility] facility.  _34  Individuals who are inpatients of an out of state hospital, or in state or out of state veterans/Military hospital and seek direct admission to a [facility] facility  _35  Individuals who are pateints or residents of a state owned/operated facility that is licensed by Department of [employer] ([employer]) and seek direct admission to [facility] facility  _36  A screening not required for enrollment in [facility] services as set out in 12 VAC [id number]  _37  [facility]) staff shall perform screenings of the [facility] clients.    Advanced Care Plan:  _38 Surrogate Decision Maker of Care  _39 POA  _40   Communicated Code Status and copy sent.    Other:         Nathanial Millman RN/CRM  [phone]

## 2022-09-30 NOTE — Progress Notes (Signed)
[facility] [address]       GI PROGRESS NOTE  Jaime [name], [place]  [phone] office  NP/PA in-hospital M-F until 4:30PM  After 5PM or on weekends, please call operator for physician on call      NAME: Jaime [name]   DOB:  Jul 20, 1946   MRN:  [id number]       Subjective:     Patient resting in bed, just finished ADLs with nurse. Denies abdominal pain, nausea, or vomiting. Nurse reports no bloody stools yesterday or today.    Objective:     VITALS:   Last 24hrs VS reviewed since prior progress note. Most recent are:  Vitals:    09/20/2022 1000   BP:    Pulse: 93   Resp:    Temp:    SpO2:        PHYSICAL EXAM:  General: Cooperative, no acute distress    Neurologic:  Alert and oriented X 3.  HEENT: EOMI, no scleral icterus   Lungs:  CTA bilaterally. No wheezing  Heart:  S1 S2, regular rhythm  Abdomen: Soft, non-distended, no tenderness. +Bowel sounds  Extremities: No edema  Psych:   Good insight. Not anxious or agitated.    Lab Data Reviewed:     Recent Results (from the past 24 hour(s))   CBC with Auto Differential    Collection Time: 09/29/22 10:36 AM   Result Value Ref Range    WBC 9.3 4.1 - 11.1 K/uL    RBC 3.49 (L) 4.10 - 5.70 M/uL    Hemoglobin 10.0 (L) 12.1 - 17.0 g/dL    Hematocrit 29.7 (L) 36.6 - 50.3 %    MCV 85.1 80.0 - 99.0 FL    MCH 28.7 26.0 - 34.0 PG    MCHC 33.7 30.0 - 36.5 g/dL    RDW 19.0 (H) 11.5 - 14.5 %    Platelets 58 (L) 150 - 400 K/uL    Nucleated RBCs 0.0 0 PER 100 WBC    nRBC 0.00 0.00 - 0.01 K/uL    Neutrophils % 73 32 - 75 %    Lymphocytes % 19 12 - 49 %    Monocytes % 6 5 - 13 %    Eosinophils % 0 0 - 7 %    Basophils % 0 0 - 1 %    Immature Granulocytes 1 (H) 0.0 - 0.5 %    Neutrophils Absolute 6.8 1.8 - 8.0 K/UL    Lymphocytes Absolute 1.8 0.8 - 3.5 K/UL    Monocytes Absolute 0.6 0.0 - 1.0 K/UL    Eosinophils Absolute 0.0 0.0 - 0.4 K/UL    Basophils Absolute 0.0 0.0 - 0.1 K/UL    Absolute Immature Granulocyte 0.1 (H) 0.00 - 0.04 K/UL     Differential Type AUTOMATED     POCT Glucose    Collection Time: 09/29/22 12:24 PM   Result Value Ref Range    POC Glucose 124 (H) 65 - 117 mg/dL    Performed by: Leonia Reeves    POCT Glucose    Collection Time: 09/29/22  6:51 PM   Result Value Ref Range    POC Glucose 106 65 - 117 mg/dL    Performed by: Leonia Reeves    POCT Glucose    Collection Time: 09/29/22  8:39 PM   Result Value Ref Range    POC Glucose 108 65 - 117 mg/dL    Performed by:  Neal Chauncey CON    CBC with Auto Differential    Collection Time: 09/09/2022  4:48 AM   Result Value Ref Range    WBC 9.9 4.1 - 11.1 K/uL    RBC 2.98 (L) 4.10 - 5.70 M/uL    Hemoglobin 8.7 (L) 12.1 - 17.0 g/dL    Hematocrit 95.6 (L) 36.6 - 50.3 %    MCV 85.6 80.0 - 99.0 FL    MCH 29.2 26.0 - 34.0 PG    MCHC 34.1 30.0 - 36.5 g/dL    RDW 21.3 (H) 08.6 - 14.5 %    Platelets 98 (L) 150 - 400 K/uL    MPV 11.3 8.9 - 12.9 FL    Nucleated RBCs 0.0 0 PER 100 WBC    nRBC 0.00 0.00 - 0.01 K/uL    Neutrophils % 75 32 - 75 %    Lymphocytes % 15 12 - 49 %    Monocytes % 9 5 - 13 %    Eosinophils % 0 0 - 7 %    Basophils % 0 0 - 1 %    Immature Granulocytes 1 (H) 0.0 - 0.5 %    Neutrophils Absolute 7.4 1.8 - 8.0 K/UL    Lymphocytes Absolute 1.5 0.8 - 3.5 K/UL    Monocytes Absolute 0.9 0.0 - 1.0 K/UL    Eosinophils Absolute 0.0 0.0 - 0.4 K/UL    Basophils Absolute 0.0 0.0 - 0.1 K/UL    Absolute Immature Granulocyte 0.1 (H) 0.00 - 0.04 K/UL    Differential Type SMEAR SCANNED      RBC Comment TARGET CELLS     Renal Function Panel    Collection Time: 09/29/2022  4:48 AM   Result Value Ref Range    Sodium 136 136 - 145 mmol/L    Potassium 3.6 3.5 - 5.1 mmol/L    Chloride 104 97 - 108 mmol/L    CO2 28 21 - 32 mmol/L    Anion Gap 4 (L) 5 - 15 mmol/L    Glucose 105 (H) 65 - 100 mg/dL    BUN 20 6 - 20 MG/DL    Creatinine 5.78 (H) 0.70 - 1.30 MG/DL    Bun/Cre Ratio 10 (L) 12 - 20      Est, Glom Filt Rate 36 (L) >60 ml/min/1.58m2    Calcium 8.1 (L) 8.5 - 10.1 MG/DL    Phosphorus 1.4 (L) 2.6 - 4.7 MG/DL     Albumin 2.2 (L) 3.5 - 5.0 g/dL   Hepatitis B Surface Antigen    Collection Time: 09/11/2022  5:05 AM   Result Value Ref Range    Hepatitis B Surface Ag <0.10 Index    Hep B S Ag Interp Negative NEG     POCT Glucose    Collection Time: 09/17/2022  8:31 AM   Result Value Ref Range    POC Glucose 107 65 - 117 mg/dL    Performed by: Sanjuana Letters        CT ABD/PELVIS 11/25 FINDINGS:   LOWER THORAX: Bilateral mild sized pleural effusion with adjacent atelectasis.  Small nodular infiltrate pleural-based at the left lung base anteriorly.     LIVER: No mass.  BILIARY TREE: Gallbladder is within normal limits. CBD is not dilated.  SPLEEN: within normal limits.  PANCREAS: No mass or ductal dilatation.  ADRENALS: Unremarkable.  KIDNEYS: No mass, calculus, or hydronephrosis.  STOMACH: Unremarkable.  SMALL BOWEL: No dilatation or wall thickening.  COLON: Fecal stasis and diverticulosis.  There is some mild enhancement in the colonic wall of the proximal descending and in the rectal area possibly related to inflammatory changes. No definite evidence of intraluminal extravasation of contrast by this technique.     PERITONEUM: No ascites or pneumoperitoneum.  RETROPERITONEUM: No lymphadenopathy or aortic aneurysm.     URINARY BLADDER: No mass or calculus.  BONES: No destructive bone lesion.  ABDOMINAL WALL: No mass or hernia.  ADDITIONAL COMMENTS: N/A     IMPRESSION:  No definite extravasation of contrast. As above.    CT ABD/PELVIS 11/23 IMPRESSION:     1. No CT evidence of acute GI bleed.  2. Rectal wall thickening and mucosal enhancement suggests proctitis. Large  amount stool in the colon with no bowel obstruction.  3. No definite change in left hepatic fluid collection and adjacent ill-defined  stranding.  4. Heterogeneous low density in the pancreatic head and adjacent pancreatic  stranding may be unchanged to prior study performed without IV contrast.  Correlate for clinical signs of acute pancreatitis. Consider follow-up  contrast  enhanced pancreas CT to document resolution..  5. Bibasilar atelectasis and pleural effusions     Assessment:     Distal colonic wall thickening: Seen on CT as above, suspicious for proctitis. WBC 6.5. Hgb 8.7., PLT 98.  Can consider colonoscopy once Bps more stable - no need for urgent colonoscopy. On antibiotics. On PPI. Will monitor for further bleeding   Anemia: Hgb 8.7, PLT 98. Consider repeat PLT levels and hematology consult if still low. Will monitor  Hypotension: on midodrine      Patient Active Problem List   Diagnosis    Gastric perforation (HCC)    Type 2 diabetes mellitus with hyperglycemia, without long-term current use of insulin (HCC)    Intestinal obstruction (HCC)    Severe sepsis (HCC)    Septic shock (HCC)    E coli infection    Liver abscess    Pneumoperitoneum    Acute respiratory failure with hypoxia (HCC)    AKI (acute kidney injury) (HCC)    Multi-organ failure with heart failure (HCC)    Thrombocytopenia (HCC)    Bacteremia due to Escherichia coli    Hepatic abscess    Peripheral arterial disease (HCC)    Hyperbilirubinemia    Gram negative sepsis (HCC)    Septicemia (HCC)    Aspiration pneumonia of both lower lobes (HCC)    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 Janetta Hora     Signed By: Reymundo Poll, PA     09/22/2022  10:20 AM

## 2022-09-30 NOTE — ED Notes (Signed)
Report called to Meriel Pica, RN in the CCU     Mack Guise, [place]  09/03/2022 2127

## 2022-09-30 NOTE — Progress Notes (Signed)
[facility]   [address], [address] [place], [place] [address]  Phone: [phone]   [phone]    [email or web address]     Nephrology Progress Note    Patient Name : Jaime [name]     DOB : Jul 31, 1946     MRN : [id number]  Date of Admission : 09/23/2022  Date of Servive : 10/09/22    CC:  Follow up for ESRD       Assessment and Plan   ESRD-HD  -Dialyzes MWF at [facility]  -Access: [facility]: Placed 07/01/2022  -cont MWF HD  -albumin PRN    Acute proctitis:  Rectal bleed  -Per primary team  - GI folloowing    MBD of CKD  - sCa stable will request Phos level  - on Renvela    Hypoglycemia:  -On D5W infusion    Anemia in CKD:  - ESA MWF  - recommend transfusion for Hgb<7  - CTA (-) for active bleed  - GI following    Chronic hypotension:  -Continue midodrine  -IV albumin PRN    PVD: S/p B/L TMA  DM2  Hx of pancreatitis  Hx of liver abscess s/p drainage  Sacral ulcer     Interval History:  Seen and examined on dialysis.  BP low, but chronic for him.  Received albumin with treatment.  Awake, alert, no UF today with treatment.    Review of Systems: A comprehensive review of systems was negative.    Current Medications:   Current Facility-Administered Medications   Medication Dose Route Frequency    epoetin alfa-epbx (RETACRIT) injection 10,000 Units  10,000 Units SubCUTAneous Once per day on Mon Wed Fri    balsum peru-castor oil (VENELEX) ointment   Topical TID    Virt-Caps 1 mg  1 capsule Oral Daily    dextrose 10 % infusion   IntraVENous Continuous    metroNIDAZOLE (FLAGYL) tablet 500 mg  500 mg Oral 3 times per day    Vancomycin - Pharmacy to Dose (HD Patient)   Other RX Placeholder    midodrine (PROAMATINE) tablet 15 mg  15 mg Oral TID    pantoprazole (PROTONIX) 40 mg in sodium chloride (PF) 0.9 % 10 mL injection  40 mg IntraVENous Q12H    glucose chewable tablet 16 g  4 tablet Oral PRN    dextrose bolus 10% 125 mL  125 mL IntraVENous PRN    Or    dextrose bolus 10% 250 mL  250  mL IntraVENous PRN    glucagon injection 1 mg  1 mg SubCUTAneous PRN    dextrose 10 % infusion   IntraVENous Continuous PRN    heparin (porcine) 1000 UNIT/ML injection 1,800 Units  1,800 Units IntraCATHeter PRN    And    heparin (porcine) 1000 UNIT/ML injection 1,800 Units  1,800 Units IntraCATHeter PRN    levoFLOXacin (LEVAQUIN) 500 MG/100ML infusion 500 mg  500 mg IntraVENous Q48H    albumin human 25% IV solution 25 g  25 g IntraVENous PRN    polyethylene glycol (GLYCOLAX) packet 17 g  17 g Oral Daily    mirtazapine (REMERON) tablet 7.5 mg  7.5 mg Oral Nightly    sodium chloride flush 0.9 % injection 5-40 mL  5-40 mL IntraVENous 2 times per day    sodium chloride flush 0.9 % injection 5-40 mL  5-40 mL IntraVENous PRN    0.9 % sodium chloride infusion  IntraVENous PRN    ondansetron (ZOFRAN-ODT) disintegrating tablet 4 mg  4 mg Oral Q8H PRN    Or    ondansetron (ZOFRAN) injection 4 mg  4 mg IntraVENous Q6H PRN    polyethylene glycol (GLYCOLAX) packet 17 g  17 g Oral Daily PRN    acetaminophen (TYLENOL) tablet 650 mg  650 mg Oral Q6H PRN    Or    acetaminophen (TYLENOL) suppository 650 mg  650 mg Rectal Q6H PRN      Allergies   Allergen Reactions    Augmentin [Amoxicillin-Pot Clavulanate] Hives     Tolerated ceftriaxone 06/2022    Codeine Itching     Unknown reaction    Oxycodone Itching       Objective:  Vitals:    Vitals:    09/06/2022 0800 09/13/2022 0815 09/28/2022 0830 09/25/2022 0845   BP: 94/73 (!) 107/48 98/65 (!) 95/55   Pulse: 76 75 85 71   Resp:       Temp: 97.8 F (36.6 C)      TempSrc:       SpO2: 100%      Weight:       Height:         Intake and Output:  No intake/output data recorded.  No intake/output data recorded.    Physical Examination:  General: ill looking  Neck:  Supple, no mass  Resp:  Decreased BS no wheezing , normal respiratory effort  CV:  RRR,  no murmur or rub, trace ankle edema  GI:  Soft, NT, + BS, no HS megaly  Neurologic:  Non focal  Dialysis Access : [facility]    []     High complexity  decision making was performed  []     Patient is at high-risk of decompensation with multiple organ involvement    Lab Data Personally Reviewed: I have reviewed all the pertinent labs, microbiology data and radiology studies during assessment.    Labs:  Recent Labs     09/28/22  0415 09/29/22  0710 09/23/2022  0448   NA 136 137 136   K 3.6 4.1 3.6   CL 104 105 104   CO2 26 28 28    GLUCOSE 102* 95 105*   BUN 26* 13 20   CREATININE 2.31* 1.57* 1.93*   CALCIUM 8.5 7.9* 8.1*         Recent Labs     09/28/22  0727 09/28/22  2117 09/29/22  0710 09/29/22  1036 09/02/2022  0448   WBC 15.5*  --  6.1 9.3 9.9   RBC 3.87*  --  2.87* 3.49* 2.98*   HGB 11.4*   < > 8.3* 10.0* 8.7*   HCT 33.4*   < > 24.4* 29.7* 25.5*   MCV 86.3  --  85.0 85.1 85.6   MCH 29.5  --  28.9 28.7 29.2   MCHC 34.1  --  34.0 33.7 34.1   RDW 18.9*  --  18.6* 19.0* 19.0*   PLT 108*  --  17* 58* 98*   MPV 11.2  --   --   --  11.3    < > = values in this interval not displayed.       No results for input(s): "TP", "ALB", "GLOB", "AML" in the last 72 hours.    Invalid input(s): "SGOT", "GPT", "AP", "TBIL", "AMYP", "LPSE", "LAC"    Recent Labs     09/27/22  1751   INR 1.9*  No results for input(s): "CPK", "CKMB", "TROPONINI" in the last 72 hours.    Invalid input(s): "B-NP"  Invalid input(s): "PHI", "PCO2I", "PO2I", "FIO2I"     Ventilator:       Microbiology:  No results found for: "SDES"  No components found for: "CULT"      I have reviewed the flowsheets.  Chart and Pertinent Notes have been reviewed.   No change in PMH ,family and social history from Consult note.      Criselda Peaches, MD  [facility]

## 2022-10-01 ENCOUNTER — Inpatient Hospital Stay: Payer: MEDICARE

## 2022-10-01 ENCOUNTER — Inpatient Hospital Stay: Admit: 2022-10-01 | Payer: MEDICARE

## 2022-10-01 LAB — CBC WITH AUTO DIFFERENTIAL
Band Neutrophils: 2 % (ref 0–6)
Basophils %: 0 % (ref 0–1)
Basophils Absolute: 0 10*3/uL (ref 0.0–0.1)
Eosinophils %: 0 % (ref 0–7)
Eosinophils Absolute: 0 10*3/uL (ref 0.0–0.4)
Hematocrit: 25 % — ABNORMAL LOW (ref 36.6–50.3)
Hemoglobin: 8.6 g/dL — ABNORMAL LOW (ref 12.1–17.0)
Immature Granulocytes %: 0 %
Immature Granulocytes Absolute: 0 10*3/uL
Lymphocytes %: 15 % (ref 12–49)
Lymphocytes Absolute: 1.8 10*3/uL (ref 0.8–3.5)
MCH: 28.9 PG (ref 26.0–34.0)
MCHC: 34.4 g/dL (ref 30.0–36.5)
MCV: 83.9 FL (ref 80.0–99.0)
MPV: 10.1 FL (ref 8.9–12.9)
Monocytes %: 8 % (ref 5–13)
Monocytes Absolute: 1 10*3/uL (ref 0.0–1.0)
Neutrophils %: 75 % (ref 32–75)
Neutrophils Absolute: 9.4 10*3/uL — ABNORMAL HIGH (ref 1.8–8.0)
Nucleated RBCs: 0 PER 100 WBC
Platelets: 107 10*3/uL — ABNORMAL LOW (ref 150–400)
RBC: 2.98 M/uL — ABNORMAL LOW (ref 4.10–5.70)
RDW: 19.2 % — ABNORMAL HIGH (ref 11.5–14.5)
WBC: 12.2 10*3/uL — ABNORMAL HIGH (ref 4.1–11.1)
nRBC: 0 10*3/uL (ref 0.00–0.01)

## 2022-10-01 LAB — POCT GLUCOSE
POC Glucose: 126 mg/dL — ABNORMAL HIGH (ref 65–117)
POC Glucose: 96 mg/dL (ref 65–117)
POC Glucose: 76 mg/dL (ref 65–117)
POC Glucose: 81 mg/dL (ref 65–117)

## 2022-10-01 LAB — BASIC METABOLIC PANEL W/ REFLEX TO MG FOR LOW K
Anion Gap: 6 mmol/L (ref 5–15)
BUN/Creatinine Ratio: 10 — ABNORMAL LOW (ref 12–20)
BUN: 14 MG/DL (ref 6–20)
CO2: 28 mmol/L (ref 21–32)
Calcium: 7.8 MG/DL — ABNORMAL LOW (ref 8.5–10.1)
Chloride: 104 mmol/L (ref 97–108)
Creatinine: 1.4 MG/DL — ABNORMAL HIGH (ref 0.70–1.30)
Est, Glom Filt Rate: 52 mL/min/{1.73_m2} — ABNORMAL LOW (ref >60)
Glucose: 62 mg/dL — ABNORMAL LOW (ref 65–100)
Potassium: 3.8 mmol/L (ref 3.5–5.1)
Sodium: 138 mmol/L (ref 136–145)

## 2022-10-01 LAB — PROTIME-INR
INR: 2.2 — ABNORMAL HIGH (ref 0.9–1.1)
Protime: 21.9 s — ABNORMAL HIGH (ref 9.0–11.1)

## 2022-10-01 LAB — PHOSPHORUS: Phosphorus: 0.8 MG/DL — CL (ref 2.6–4.7)

## 2022-10-01 LAB — LACTIC ACID
Lactic Acid, Plasma: 3.1 MMOL/L (ref 0.4–2.0)
Lactic Acid, Plasma: 1.9 MMOL/L (ref 0.4–2.0)

## 2022-10-01 LAB — MAGNESIUM: Magnesium: 1.5 mg/dL — ABNORMAL LOW (ref 1.6–2.4)

## 2022-10-01 MED ORDER — GLUCOSE 4 G PO CHEW
4 g | ORAL | Status: AC | PRN
Start: 2022-10-01 — End: 2022-10-04

## 2022-10-01 MED ORDER — MIDODRINE HCL 5 MG PO TABS
5 MG | Freq: Three times a day (TID) | ORAL | Status: DC
Start: 2022-10-01 — End: 2022-10-01
  Administered 2022-10-01 (×2): 15 mg via ORAL

## 2022-10-01 MED ORDER — MAGNESIUM SULFATE IN D5W 1-5 GM/100ML-% IV SOLN
1-5 GM/100ML-% | Freq: Once | INTRAVENOUS | Status: AC
Start: 2022-10-01 — End: 2022-10-04

## 2022-10-01 MED ORDER — OSELTAMIVIR PHOSPHATE 30 MG PO CAPS
30 MG | Freq: Two times a day (BID) | ORAL | Status: DC
Start: 2022-10-01 — End: 2022-10-01
  Administered 2022-10-01: 14:00:00 30 mg via ORAL

## 2022-10-01 MED ORDER — SODIUM CHLORIDE 0.9 % IV SOLN
0.9 % | Freq: Once | INTRAVENOUS | Status: AC
Start: 2022-10-01 — End: 2022-10-01
  Administered 2022-10-01: 18:00:00 15 mmol via INTRAVENOUS

## 2022-10-01 MED ORDER — DEXTROSE 10 % IV BOLUS
INTRAVENOUS | Status: AC | PRN
Start: 2022-10-01 — End: 2022-10-04

## 2022-10-01 MED ORDER — DEXTROSE 10 % IV SOLN
10 % | INTRAVENOUS | Status: AC | PRN
Start: 2022-10-01 — End: 2022-10-04

## 2022-10-01 MED ORDER — COLLAGENASE 250 UNIT/GM EX OINT
250 UNIT/GM | Freq: Every day | CUTANEOUS | Status: DC
Start: 2022-10-01 — End: 2022-10-04
  Administered 2022-10-01 – 2022-10-03 (×2): via TOPICAL

## 2022-10-01 MED ORDER — CEFEPIME HCL 1 G IJ SOLR
1 g | INTRAMUSCULAR | Status: AC
Start: 2022-10-01 — End: 2022-10-04
  Administered 2022-10-02 – 2022-10-03 (×2): 1000 mg via INTRAVENOUS

## 2022-10-01 MED ORDER — METRONIDAZOLE 250 MG PO TABS
250 MG | Freq: Two times a day (BID) | ORAL | Status: DC
Start: 2022-10-01 — End: 2022-10-01
  Administered 2022-10-01 (×2): 500 mg via ORAL

## 2022-10-01 MED ORDER — ACETAMINOPHEN 325 MG PO TABS
325 MG | Freq: Four times a day (QID) | ORAL | Status: DC | PRN
Start: 2022-10-01 — End: 2022-10-01

## 2022-10-01 MED ORDER — CEFEPIME HCL 2 G IV SOLR
2 g | Freq: Once | INTRAVENOUS | Status: AC
Start: 2022-10-01 — End: 2022-10-01
  Administered 2022-10-01: 18:00:00 2000 mg via INTRAVENOUS

## 2022-10-01 MED ORDER — SODIUM CHLORIDE 0.9 % IV SOLN
0.9 % | INTRAVENOUS | Status: AC | PRN
Start: 2022-10-01 — End: 2022-10-04

## 2022-10-01 MED ORDER — OSELTAMIVIR PHOSPHATE 30 MG PO CAPS
30 MG | ORAL | Status: DC
Start: 2022-10-01 — End: 2022-10-04

## 2022-10-01 MED ORDER — METRONIDAZOLE 500 MG/100ML IV SOLN
500100 MG/100ML | Freq: Three times a day (TID) | INTRAVENOUS | Status: DC
Start: 2022-10-01 — End: 2022-10-04
  Administered 2022-10-01 – 2022-10-04 (×9): 500 mg via INTRAVENOUS

## 2022-10-01 MED ORDER — ALBUTEROL SULFATE (2.5 MG/3ML) 0.083% IN NEBU
Freq: Four times a day (QID) | RESPIRATORY_TRACT | Status: AC | PRN
Start: 2022-10-01 — End: 2022-10-04

## 2022-10-01 MED ORDER — MIRTAZAPINE 15 MG PO TABS
15 MG | Freq: Every evening | ORAL | Status: AC
Start: 2022-10-01 — End: 2022-10-04

## 2022-10-01 MED ORDER — ACETAMINOPHEN 325 MG PO TABS
325 | Freq: Four times a day (QID) | ORAL | Status: DC | PRN
Start: 2022-10-01 — End: 2022-10-04

## 2022-10-01 MED ORDER — NOREPINEPHRINE-SODIUM CHLORIDE 16-0.9 MG/250ML-% IV SOLN
INTRAVENOUS | Status: AC
Start: 2022-10-01 — End: 2022-10-02
  Administered 2022-10-01: 01:00:00 5 ug/min via INTRAVENOUS

## 2022-10-01 MED ORDER — GLUCAGON (RDNA) 1 MG IJ KIT
1 MG | INTRAMUSCULAR | Status: DC | PRN
Start: 2022-10-01 — End: 2022-10-04

## 2022-10-01 MED ORDER — LEVOFLOXACIN 500 MG PO TABS
500 MG | ORAL | Status: DC
Start: 2022-10-01 — End: 2022-10-01
  Administered 2022-10-01: 06:00:00 500 mg via ORAL

## 2022-10-01 MED ORDER — INSULIN LISPRO 100 UNIT/ML IJ SOLN
100 UNIT/ML | Freq: Every evening | INTRAMUSCULAR | Status: DC
Start: 2022-10-01 — End: 2022-10-04

## 2022-10-01 MED ORDER — DEXTROSE 50 % IV SOLN
50 % | INTRAVENOUS | Status: AC | PRN
Start: 2022-10-01 — End: 2022-10-04
  Administered 2022-10-01 – 2022-10-02 (×2): 25 g via INTRAVENOUS

## 2022-10-01 MED ORDER — MIDODRINE HCL 5 MG PO TABS
5 MG | Freq: Three times a day (TID) | ORAL | Status: AC
Start: 2022-10-01 — End: 2022-10-04
  Administered 2022-10-01 – 2022-10-02 (×3): 20 mg via ORAL

## 2022-10-01 MED ORDER — SODIUM CHLORIDE 0.9 % IV SOLN
0.9 % | INTRAVENOUS | Status: DC | PRN
Start: 2022-10-01 — End: 2022-10-04
  Administered 2022-10-01 – 2022-10-03 (×2): 15 mmol via INTRAVENOUS

## 2022-10-01 MED ORDER — POLYETHYLENE GLYCOL 3350 17 G PO PACK
17 g | Freq: Every day | ORAL | Status: AC | PRN
Start: 2022-10-01 — End: 2022-10-04

## 2022-10-01 MED ORDER — LACTATED RINGERS IV BOLUS
Freq: Once | INTRAVENOUS | Status: AC
Start: 2022-10-01 — End: 2022-09-30
  Administered 2022-10-01: 02:00:00 500 mL via INTRAVENOUS

## 2022-10-01 MED ORDER — INSULIN LISPRO 100 UNIT/ML IJ SOLN
100 UNIT/ML | Freq: Three times a day (TID) | INTRAMUSCULAR | Status: AC
Start: 2022-10-01 — End: 2022-10-04

## 2022-10-01 MED ORDER — BALSAM PERU-CASTOR OIL TOPICAL OINTMENT
Freq: Two times a day (BID) | CUTANEOUS | Status: DC
Start: 2022-10-01 — End: 2022-10-04
  Administered 2022-10-01 – 2022-10-04 (×5): via TOPICAL

## 2022-10-01 MED ORDER — NORMAL SALINE FLUSH 0.9 % IV SOLN
0.9 % | INTRAVENOUS | Status: AC | PRN
Start: 2022-10-01 — End: 2022-10-04

## 2022-10-01 MED ORDER — MAGNESIUM SULFATE 2000 MG/50 ML IVPB PREMIX
2 GM/50ML | INTRAVENOUS | Status: AC | PRN
Start: 2022-10-01 — End: 2022-10-04
  Administered 2022-10-01 (×2): 2000 mg via INTRAVENOUS

## 2022-10-01 MED ORDER — POTASSIUM & SODIUM PHOSPHATES 280-160-250 MG PO PACK
280-160-250 MG | Freq: Four times a day (QID) | ORAL | Status: AC
Start: 2022-10-01 — End: 2022-10-03
  Administered 2022-10-01 – 2022-10-02 (×3): 250 via ORAL

## 2022-10-01 MED ORDER — VANCOMYCIN INTERMITTENT DOSING (PLACEHOLDER)
INTRAVENOUS | Status: DC
Start: 2022-10-01 — End: 2022-10-04

## 2022-10-01 MED ORDER — ACETAMINOPHEN 650 MG RE SUPP
650 | Freq: Four times a day (QID) | RECTAL | Status: DC | PRN
Start: 2022-10-01 — End: 2022-10-04
  Administered 2022-10-03 (×3): 650 mg via RECTAL

## 2022-10-01 MED ORDER — ONDANSETRON HCL 4 MG/2ML IJ SOLN
4 MG/2ML | Freq: Four times a day (QID) | INTRAMUSCULAR | Status: DC | PRN
Start: 2022-10-01 — End: 2022-10-04

## 2022-10-01 MED ORDER — SODIUM CHLORIDE (PF) 0.9 % IJ SOLN
0.9 % | Freq: Every day | INTRAMUSCULAR | Status: DC
Start: 2022-10-01 — End: 2022-10-04
  Administered 2022-10-01 – 2022-10-03 (×4): 40 mg via INTRAVENOUS

## 2022-10-01 MED ORDER — ZINC SULFATE 220 (50 ZN) MG PO CAPS
220 (50 Zn) MG | Freq: Every day | ORAL | Status: AC
Start: 2022-10-01 — End: 2022-10-04
  Administered 2022-10-01: 14:00:00 50 mg via ORAL

## 2022-10-01 MED ORDER — SEVELAMER CARBONATE 2.4 G PO PACK
2.4 g | Freq: Three times a day (TID) | ORAL | Status: AC
Start: 2022-10-01 — End: 2022-10-03
  Administered 2022-10-01 (×2): 2.4 g via ORAL

## 2022-10-01 MED ORDER — HEPARIN SODIUM (PORCINE) 5000 UNIT/ML IJ SOLN
5000 UNIT/ML | Freq: Two times a day (BID) | INTRAMUSCULAR | Status: AC
Start: 2022-10-01 — End: 2022-10-04
  Administered 2022-10-01 – 2022-10-04 (×6): 5000 [IU] via SUBCUTANEOUS

## 2022-10-01 MED ORDER — NORMAL SALINE FLUSH 0.9 % IV SOLN
0.9 % | Freq: Two times a day (BID) | INTRAVENOUS | Status: AC
Start: 2022-10-01 — End: 2022-10-04
  Administered 2022-10-01 – 2022-10-04 (×6): 10 mL via INTRAVENOUS

## 2022-10-01 MED ORDER — VANCOMYCIN HCL 1 G IV SOLR
1 g | Freq: Once | INTRAVENOUS | Status: AC
Start: 2022-10-01 — End: 2022-10-01
  Administered 2022-10-01: 18:00:00 1000 mg via INTRAVENOUS

## 2022-10-01 MED ORDER — IPRATROPIUM BROMIDE 0.02 % IN SOLN
0.02 % | Freq: Three times a day (TID) | RESPIRATORY_TRACT | Status: DC
Start: 2022-10-01 — End: 2022-10-01
  Administered 2022-10-01 (×2): 0.5 mg via RESPIRATORY_TRACT

## 2022-10-01 MED ORDER — OSELTAMIVIR PHOSPHATE 75 MG PO CAPS
75 MG | Freq: Once | ORAL | Status: AC
Start: 2022-10-01 — End: 2022-10-01
  Administered 2022-10-01: 06:00:00 75 mg via ORAL

## 2022-10-01 MED ORDER — CEFEPIME HCL 1 G IJ SOLR
1 g | INTRAMUSCULAR | Status: DC
Start: 2022-10-01 — End: 2022-10-01

## 2022-10-01 MED FILL — MAGNESIUM SULFATE 2 GM/50ML IV SOLN: 2 GM/50ML | INTRAVENOUS | Qty: 50

## 2022-10-01 MED FILL — PANTOPRAZOLE SODIUM 40 MG IV SOLR: 40 MG | INTRAVENOUS | Qty: 40

## 2022-10-01 MED FILL — IPRATROPIUM BROMIDE 0.02 % IN SOLN: 0.02 % | RESPIRATORY_TRACT | Qty: 2.5

## 2022-10-01 MED FILL — MIDODRINE HCL 5 MG PO TABS: 5 MG | ORAL | Qty: 3

## 2022-10-01 MED FILL — ZINC-220 220 (50 ZN) MG PO CAPS: 220 (50 Zn) MG | ORAL | Qty: 1

## 2022-10-01 MED FILL — DEXTROSE 50 % IV SOLN: 50 % | INTRAVENOUS | Qty: 50

## 2022-10-01 MED FILL — VANCOMYCIN HCL 1 G IV SOLR: 1 g | INTRAVENOUS | Qty: 1000

## 2022-10-01 MED FILL — SODIUM PHOSPHATES 15 MMOLE/5ML IV SOLN: 15 MMOLE/5ML | INTRAVENOUS | Qty: 5

## 2022-10-01 MED FILL — NOREPINEPHRINE-SODIUM CHLORIDE 16-0.9 MG/250ML-% IV SOLN: INTRAVENOUS | Qty: 250

## 2022-10-01 MED FILL — METRONIDAZOLE 250 MG PO TABS: 250 MG | ORAL | Qty: 2

## 2022-10-01 MED FILL — DEXTROSE 10 % IV SOLN: 10 % | INTRAVENOUS | Qty: 1000

## 2022-10-01 MED FILL — BALSAM PERU-CASTOR OIL TOPICAL OINTMENT: CUTANEOUS | Qty: 56.7

## 2022-10-01 MED FILL — PHOS-NAK 280-160-250 MG PO PACK: 280-160-250 MG | ORAL | Qty: 1

## 2022-10-01 MED FILL — RENVELA 2.4 G PO PACK: 2.4 g | ORAL | Qty: 2.4

## 2022-10-01 MED FILL — LACTATED RINGERS IV SOLN: INTRAVENOUS | Qty: 500

## 2022-10-01 MED FILL — SODIUM PHOSPHATES 45 MMOLE/15ML IV SOLN: 45 MMOLE/15ML | INTRAVENOUS | Qty: 5

## 2022-10-01 MED FILL — LEVOFLOXACIN 500 MG PO TABS: 500 MG | ORAL | Qty: 1

## 2022-10-01 MED FILL — SEVELAMER CARBONATE 2.4 G PO PACK: 2.4 g | ORAL | Qty: 2.4

## 2022-10-01 MED FILL — METRONIDAZOLE 500 MG/100ML IV SOLN: 500 MG/100ML | INTRAVENOUS | Qty: 100

## 2022-10-01 MED FILL — OSELTAMIVIR PHOSPHATE 30 MG PO CAPS: 30 MG | ORAL | Qty: 1

## 2022-10-01 MED FILL — OSELTAMIVIR PHOSPHATE 75 MG PO CAPS: 75 MG | ORAL | Qty: 1

## 2022-10-01 MED FILL — MONOJECT FLUSH SYRINGE 0.9 % IV SOLN: 0.9 % | INTRAVENOUS | Qty: 40

## 2022-10-01 MED FILL — HEPARIN SODIUM (PORCINE) 5000 UNIT/ML IJ SOLN: 5000 UNIT/ML | INTRAMUSCULAR | Qty: 1

## 2022-10-01 MED FILL — SANTYL 250 UNIT/GM EX OINT: 250 UNIT/GM | CUTANEOUS | Qty: 30

## 2022-10-01 MED FILL — MIDODRINE HCL 5 MG PO TABS: 5 MG | ORAL | Qty: 4

## 2022-10-01 MED FILL — CEFEPIME HCL 2 G IV SOLR: 2 g | INTRAVENOUS | Qty: 2

## 2022-10-01 NOTE — Progress Notes (Signed)
Comprehensive Nutrition Assessment    Type and Reason for Visit: Wound    Nutrition Recommendations/Plan:   Continue easy to chew diet.  Start vanilla low kcal/high protein Ensure, Magic cup, and TwoCal daily.  Recommend holding Renvela d/t poor intake and low phos.   Recommend stopping zinc sulfate after 10 days.        Malnutrition Assessment:  Malnutrition Status:  Severe malnutrition (10/01/22 1131)    Context:  Chronic Illness     Findings of the 6 clinical characteristics of malnutrition:  Energy Intake:  Mild decrease in energy intake (Comment)  Weight Loss:  Greater than 10% over 6 months     Body Fat Loss:  Severe body fat loss Orbital   Muscle Mass Loss:  Severe muscle mass loss Temples (temporalis)  Fluid Accumulation:  No significant fluid accumulation (noted edema- not malnutrition-related)    Grip Strength:  Not Performed       Nutrition Assessment:    Pt is a 76 y.o. male recently admitted from 11/23-11/29 for acute proctitis and rectal bleeding. He was discharged to SNF and was brought back in for hypotension and hypoxia. Weaning off pressors. HD tomorrow.    Visited pt for stage 3 heel wound. Pt had not eaten breakfast and reports a poor appetite. He requires feeding assistance, nurse offered to assist with breakfast- pt politely declined. Asked pt if he had food preferences which he also declined. Currently no rectal bleeding- had normal BM this morning per nurse. Per last RD note on 11/27 his UBW prior to starting HD was 234 lbs- currently 177 lbs w/ edema. Weight hx shows 60 lb weight loss in past 4 months (25% of body weight).  He was agreeable to starting vanilla Ensure for extra kcals/protein. Will send low kcal/high protein Ensure (out of Enlive), magic cup, and TwoCal- one at each meal.    Labs: Mg 1.5, Phos 0.8      Nutritionally Significant Medications:  Protonix, Renvela, Zinc sulfate, Levo, D50 x 1, magnesium sulfate, sodium phosphate      Estimated Daily Nutrient Needs:  Energy  Requirements Based On: Kcal/kg  Weight Used for Energy Requirements: Current  Energy (kcal/day): 9811-9147 (30-32 kcals/kg)  Weight Used for Protein Requirements: Current  Protein (g/day): 121-137 (1.5-1.7 g/kg)  Method Used for Fluid Requirements: Standard Renal  Fluid (ml/day): 1500 ml    Nutrition Related Findings:   Edema: Generalized   Edema Generalized: +2, Pitting  RUE Edema: +2  LUE Edema: +2          Bowel Movement:  09/08/2022    Wounds: Wound Type: Multiple, Pressure Injury, Unstageable      Current Nutrition Therapies:  Diet: easy to chew  Supplements: none ordered  Meal Intake:   No data found.  Supplement Intake:  No data found.  Nutrition Support: n/a      Anthropometric Measures:  Height: 172.7 cm (5' 7.99")  Ideal Body Weight (IBW): 154 lbs (70 kg)    Admission Body Weight: 80.3 kg (177 lb)  Current Body Weight: 80.5 kg (177 lb 7.5 oz), 115.2 % IBW. Weight Source: Bed Scale  Current BMI (kg/m2): 27  Usual Body Weight: 106.1 kg (234 lb)  % Weight Change (Calculated): -24.2  Weight Adjustment For: No Adjustment                 BMI Categories: Overweight (BMI 25.0-29.9)    Wt Readings from Last 10 Encounters:   10/01/22 80.5 kg (177 lb 7.5 oz)  09/24/2022 80.6 kg (177 lb 11.1 oz)   07/22/22 91.9 kg (202 lb 9.6 oz)           Nutrition Diagnosis:   Severe malnutrition related to increase demand for energy/nutrients, inadequate protein-energy intake (lack of appetite) as evidenced by dialysis, wounds, intake 0-25%, Criteria as identified in malnutrition assessment    Nutrition Interventions:   Food and/or Nutrient Delivery: Continue Current Diet, Start Oral Nutrition Supplement  Nutrition Education/Counseling: No recommendation at this time  Coordination of Nutrition Care: Continue to monitor while inpatient, Interdisciplinary Rounds       Goals:     Goals:  (PO intake 50% with 2 ONS by next RD assessment.)       Nutrition Monitoring and Evaluation:   Behavioral-Environmental Outcomes: None  Identified  Food/Nutrient Intake Outcomes: Food and Nutrient Intake, Supplement Intake  Physical Signs/Symptoms Outcomes: Biochemical Data, GI Status, Fluid Status or Edema, Nutrition Focused Physical Findings, Weight    Discharge Planning:    Continue current diet     Melissa Montane  Available via [facility]

## 2022-10-01 NOTE — Plan of Care (Signed)
Problem: Occupational Therapy - Adult  Goal: By Discharge: Performs self-care activities at highest level of function for planned discharge setting.  See evaluation for individualized goals.  Description: FUNCTIONAL STATUS PRIOR TO ADMISSION:  patient was in [facility] from 11/23-11/29 and went to SNF, returned 2/2 hypotension. Patient has had long complicated medical course and has been in and out of hospital. Patient max A x2 for ADLs and mobility.    Occupational Therapy Goals:  Initiated 10/01/2022  1.  Patient will perform self-feeding in supported sitting with Moderate Assist within 7 day(s).  2.  Patient will perform upper body bathing in supported sitting with Moderate Assist within 7 day(s).  3.  Patient will perform upper body dressing with Moderate Assist within 7 day(s).  4.  Patient will perform rolling in bed for toileting with max A within 7 day(s).  5.  Patient will participate in upper extremity therapeutic exercise/activities with Minimal Assist for 5 minutes within 7 day(s).    Outcome: Progressing    OCCUPATIONAL THERAPY EVALUATION    Patient: Jaime [name] (76 y.o. male)  Date: 10/01/2022  Primary Diagnosis: Hyperbilirubinemia [E80.6]  Influenza [J11.1]  GI bleed [K92.2]  Hypoxia [R09.02]         Precautions:                    ASSESSMENT :  The patient is limited by decreased functional mobility, independence in ADLs, ROM, strength, body mechanics, activity tolerance, endurance, coordination, balance, fine-motor control s/p admission for Influenza and hypoxia. Patient admitted less than 24 hours after being discharged to SNF - has had complicated hospital course and multiple admissions. Patient required total A using bed mechanics to raise HOB but only to ~20* before patient asked therapist to stop. Patient required AAROM to lift BUE but able to hold against gravity and some resistance. Patient required min A to wash face using RUE. Patient left in semisupine with call bell in reach, RN aware, all  needs met. Recommend return to SNF once medically cleared for D/C.     Functional Outcome Measure:  The patient scored 5/100 on the Barthel Index outcome measure which is indicative of 95% impairment in self care tasks.         PLAN :  Recommendations and Planned Interventions:   self care training, therapeutic activities, functional mobility training, balance training, therapeutic exercise, endurance activities, patient education, home safety training, and family training/education    Frequency/Duration: OT Plan of Care: 2 times/week    Recommendation for discharge: (in order for the patient to meet his/her long term goals): Therapy up to 5 days/week in Skilled nursing facility    Other factors to consider for discharge: patient's current support system is unable to meet their requirements for physical assistance, impaired cognition, high risk for falls, not safe to be alone, and concern for safely navigating or managing the home environment    IF patient discharges home will need the following DME: hospital bed and mechanical lift       SUBJECTIVE:   Patient stated, "I don't feel great."  OBJECTIVE DATA SUMMARY:     Past Medical History:   Diagnosis Date    Diabetes mellitus ([place])     Hemodialysis patient ([place]) 07/12/2022    Hypertension      Past Surgical History:   Procedure Laterality Date    BLADDER SURGERY N/A 06/01/2022    ENDOSCOPY OF ILEAL CONDUIT performed by Melene Plan, MD at [facility] OR  CT VISCERAL PERCUTANEOUS DRAIN  06/05/2022    CT VISCERAL PERCUTANEOUS DRAIN 06/05/2022 [facility] RAD CT    FOOT DEBRIDEMENT Bilateral 07/23/2022    BILATERAL TRANSMETATARSAL AMPUTATION performed by Salomon Fick, DPM at [facility]    IR TUNNELED CATHETER PLACEMENT GREATER THAN 5 YEARS  07/01/2022    IR TUNNELED CATHETER PLACEMENT GREATER THAN 5 YEARS 07/01/2022 [place], APRN - NP [facility] ANGIO IR    LAPAROSCOPY N/A 06/01/2022    LAPAROSCOPY DIAGNOSTIC performed by Melene Plan, MD at [facility] N/A  06/01/2022    LAPAROTOMY EXPLORATORY performed by Melene Plan, MD at [facility]          Expanded or extensive additional review of patient history:   Lives With:  (return to SNF)    Hand Dominance: right     EXAMINATION OF PERFORMANCE DEFICITS:    Cognitive/Behavioral Status:  Orientation  Overall Orientation Status: Within Functional Limits  Orientation Level: Oriented X4  Cognition  Overall Cognitive Status: Exceptions  Arousal/Alertness: Appropriate responses to stimuli  Following Commands: Follows one step commands with increased time  Attention Span: Difficulty attending to directions  Insights: Decreased awareness of deficits  Initiation: Requires cues for some  Sequencing: Requires cues for some    Skin: appears grossly intact - some skin tears on BUEs    Edema: 1+ in BUEs    Hearing:        Vision/Perceptual:             Perception  Overall Perceptual Status: WFL    Range of Motion:   AROM: Grossly decreased, non-functional  PROM: Generally decreased, functional    Strength:  Strength: Grossly decreased, non-functional    Coordination:  Coordination: Grossly decreased, non-functional     Tone & Sensation:   Tone: Normal  Sensation: Impaired    Functional Mobility and Transfers for ADLs:    Bed Mobility:     Bed Mobility Training  Supine to Sit: Total assistance (bed mechanics utilized)    Transfers:     Pharmacologist: No    Balance:  Not tested - appears intact in supported sitting         ADL Assessment:     Feeding: Moderate assistance     Grooming: Maximum assistance     UE Bathing: Maximum assistance     LE Bathing: Dependent/Total     UE Dressing: Maximum assistance     LE Dressing: Dependent/Total     Toileting: Dependent/Total     ADL Intervention and task modifications:    Patient required max A for bed mobility and total A to use bed mechanics to raise HOB. Patient with min A to wash face.  Barthel Index:    Barthel Index Scale  Feeding: Needs help, i.e. for cutting  Bathing: Cannot perform activity  Grooming: Cannot perform activity  Dressing: Cannot perform activity  Bowel Control: Cannot perform activity  Bladder Control: Cannot perform activity  Toilet Transfers: Cannot perform activity  Chair/Bed Trannsfers: Cannot perform activity  Ambulation: Cannot perform activity  Stairs: Cannot perform activity  Total Barthel Index Score: 5       The Barthel ADL Index: Guidelines  1. The index should be used as a record of what a patient does, not as a record of what a patient could do.  2. The main aim is to establish degree of independence from any help, physical or verbal, however minor and for whatever reason.  3. The need for supervision renders the patient not independent.  4. A patient's performance should be established using the best available evidence. Asking the patient, friends/relatives and nurses are the usual sources, but direct observation and common sense are also important. However direct testing is not needed.  5. Usually the patient's performance over the preceding 24-48 hours is important, but occasionally longer periods will be relevant.  6. Middle categories imply that the patient supplies over 50 per cent of the effort.  7. Use of aids to be independent is allowed.    Score Interpretation (from [place])   80-100 Independent   60-79 Minimally independent   40-59 Partially dependent   20-39 Very dependent   <20 Totally dependent     -Mahoney, F.l., Barthel, D.W. (1965). Functional evaluation: the Barthel Index. [place] ([place]., Martin (1997). The Barthel activities of daily living index: self-reporting versus actual performance in the old (> or = 75 years). Journal of [place] 45(7), [place], J.J.M.F, Noni Saupe., Minette [place]. (1999). Measuring the change in disability after inpatient rehabilitation; comparison of the responsiveness of the Barthel Index and Functional Independence Measure. Journal of Neurology, Neurosurgery, and Psychiatry, 66(4), [phone].  Wilford Sports, N.J.A, Scholte op Katherine,  W.J.M, & Koopmanschap, M.A. (2004) Assessment of post-stroke quality of life in cost-effectiveness studies: The usefulness of the Barthel Index and the EuroQoL-5D. Quality of [place], 13, [phone]  Pain Rating:  5/10     Pain Intervention(s):   repositioning    Activity Tolerance:   Poor    After treatment:   Patient left in no apparent distress in bed, Call bell within reach, Side rails x3, Podus boots applied for pressure relief, and RN aware    COMMUNICATION/EDUCATION:   The patient's plan of care was discussed with: physical therapist and registered nurse    Patient Education  Education Given To: Patient  Education Provided: Role of Therapy  Education Method: Verbal  Barriers to Learning: Cognition  Education Outcome: Verbalized understanding;Continued education needed    Thank you for this referral.  Fidela Salisbury, OT  Minutes: 12    Occupational Therapy Evaluation Charge Determination   History Examination Decision-Making   LOW Complexity : Brief history review  HIGH Complexity: 5 Performance deficits relating to physical, cognitive, or psychosocial skills that result in activity limitations and/or participation restrictions  HIGH Complexity: Patient presents with comorbidities that affect occupational performance.  Significant modifications of tasks or assistance (eg. physical or verbal) with assessment (s) is necessary to enable pt to complete  evaluation   Based on the above components, the patient evaluation is determined to be of the following complexity level: High

## 2022-10-01 NOTE — Care Coordination-Inpatient (Addendum)
Care Management Initial Assessment       RUR: 21%  Readmission? Yes   1st IM letter given? Yes- Verbals from wife during this assessment  1st [employer] letter given: No     Patient is on droplet precaution so CM called wife to introduce self/role, verify the Facesheet and review an important message from medicare.  Wife reports patient has been in [place] since the end of July when his health declined and he was admitted to the hospital.  She reports prior to July he was ind w all ADLs/IADLs, denies DME and states he was driving and working part time.  Patient was d/c yest to [facility] for rehab and dialysis and was readmitted back to [facility] the same day.  Wife reports she has COPD and on continuous O2 so she's not able to physically help patient.  She states she would like him to be admitted to a SNF in [place] closer to her but understands it's difficult since he's in a different state.  All questions were answered and CM will follow for ongoing TOC needs.    Anticipated d/c: [facility] SNF w dialysis. Currently on O2. Needs BLS stretcher transport.      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 She has COPD and on cont O2.)   Ability to make needs known: Good   Family able to assist with home care needs: Yes   Would you like for me to discuss the discharge plan with any other family members/significant others, and if so, who? 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 Patient;Patient Representative   Name of the Patient Representative who was provided with the Choice of Provider and agrees with the Discharge Plan?  Wife, Kallen Mccrystal   The Patient and/Or Patient Representative agree with the Discharge Plan? Yes   Freedom of Choice list was provided with basic dialogue that supports the patient's individualized plan of care/goals, treatment preferences, and shares the quality data associated with the providers?  Yes     Harless Litten, MSW CCM  Care Management

## 2022-10-01 NOTE — Progress Notes (Signed)
Referral source:   Jaycub Noorani at [facility] in [facility] [address] [place]. Chaplain attended rounds in the CCU as part of the Interdisciplinary team where the patient's ongoing care was discussed. I reviewed the medical record as part of this encounter.     Outcome: Interdisciplinary team are aware of chaplain availability and were encouraged to request support as needed.      Advised nurse to contact [facility] for any further referrals.The chaplain on-call can be reached at [phone]).     Ercie Eliasen. Etheleen Mayhew, MDiv, [facility]  Staff Chaplain

## 2022-10-01 NOTE — Progress Notes (Signed)
Day #1 of Tamiflu  Indication:  Flu A+  Current regimen:  30 mg PO BID  Abx regimen: Levaquin + Flagyl PO for proctitis   Recent Labs     09/20/2022  0448 09/05/2022  1634 10/01/22  0112   WBC 9.9 11.0 12.2*   CREATININE 1.93* 1.28 1.40*   BUN 20 11 14      Est CrCl: ESRD on HD -nephrology consulted  Temp (24hrs), Avg:98.1 F (36.7 C), Min:98 F (36.7 C), Max:98.3 F (36.8 C)    Cultures: 11/29 influenza A +    Plan: Change to 30 mg after each HD for 4 more days

## 2022-10-01 NOTE — Progress Notes (Addendum)
1930: Bedside shift change report given to Mostyn Varnell RN (oncoming nurse) by Veverly Fells RN (offgoing nurse). Report included the following information Nurse Handoff Report.     2030: Pt BS 79- Given applesauce/ juice with meds. Will recheck.     2230: BS recheck 70- Pt fed more applesauce. MD notified of continuously low blood sugars.     2300: Orders for D5 continuous infusion for BS.    0000: RT at bedside- Pt [place] maxed d/t O2 sats of 85-89%. MD notified. NNO.      0100: Full CHG bath/ woundcare completed. Incontinence care completed.     0330: Labs drawn and sent.     0430: Dialysis at bedside for HD.     0730: Bedside shift change report given to [place] (oncoming nurse) by Evlyn Kanner RN (offgoing nurse). Report included the following information Nurse Handoff Report.

## 2022-10-01 NOTE — Consults (Signed)
[place] [id number]        GASTROENTEROLOGY CONSULTATION NOTE  [name], [place]  [phone] office  NP/PA in-hospital M-F until 4:30PM  After 5PM or on weekends, please call operator for physician on call        NAME:  Jaime [name]   DOB:   December 18, 1945   MRN:   [id number]       Referring Physician: Vertell Novak    Consult Date: 10/01/2022 3:51 PM    Chief Complaint: proctitis      History of Present Illness:  Patient is a 76 y.o. who is seen in consultation at the request of Dr. Vertell Novak for proctitis. The patient has a past medical history significant for HTN, ESRD on HD, DM, 05/2022 ex lap for perforation without obvious cause, liver abscess s/p drain consulted for proctitis. Pt is poor historian.Was seen on last admission for proctitis and anemia with suspected GIB. Today the patient endorses continued generalized abdominal pain. Has had a daily bowel movement. Per nurse the bowels have no evidence of hematochezia or melena. The patient is on 40LO2 at bedside and is on levophed.    Per chart review, [place] concerning findings.     I have reviewed the emergency room note, hospital admission note, notes by all other clinicians who have seen the patient during this hospitalization to date. I have reviewed the problem list and the reason for this hospitalization. I have reviewed the allergies and the medications the patient was taking at home prior to this hospitalization.    PMH:  Past Medical History:   Diagnosis Date    Diabetes mellitus ([place])     Hemodialysis patient ([place]) 07/12/2022    Hypertension        PSH:  Past Surgical History:   Procedure Laterality Date    BLADDER SURGERY N/A 06/01/2022    ENDOSCOPY OF ILEAL CONDUIT performed by Melene Plan, MD at [facility]    CT VISCERAL PERCUTANEOUS DRAIN  06/05/2022    CT VISCERAL PERCUTANEOUS DRAIN 06/05/2022 [facility] CT    FOOT DEBRIDEMENT Bilateral 07/23/2022    BILATERAL  TRANSMETATARSAL AMPUTATION performed by Salomon Fick, DPM at [facility]    IR TUNNELED CATHETER PLACEMENT GREATER THAN 5 YEARS  07/01/2022    IR TUNNELED CATHETER PLACEMENT GREATER THAN 5 YEARS 07/01/2022 [place], APRN - NP [facility] ANGIO IR    LAPAROSCOPY N/A 06/01/2022    LAPAROSCOPY DIAGNOSTIC performed by Melene Plan, MD at [place] N/A 06/01/2022    LAPAROTOMY EXPLORATORY performed by Melene Plan, MD at [facility]       Allergies:  Allergies   Allergen Reactions    Augmentin [Amoxicillin-Pot Clavulanate] Hives     Tolerated ceftriaxone 06/2022    Codeine Itching     Unknown reaction    Oxycodone Itching       Home Medications:  Prior to Admission Medications   Prescriptions Last Dose Informant Patient Reported? Taking?   acetaminophen (TYLENOL) 325 MG tablet   Yes No   Sig: Take 2 tablets by mouth every 6 hours as needed for Pain   levoFLOXacin (LEVAQUIN) 500 MG tablet   No No   Sig: Take 1 tablet by mouth every 48 hours for 4 days   metroNIDAZOLE (FLAGYL) 500 MG tablet   No No   Sig: Take  1 tablet by mouth 2 times daily for 3 days   midodrine (PROAMATINE) 10 MG tablet   No No   Sig: Take 1.5 tablets by mouth 3 times daily   mirtazapine (REMERON) 7.5 MG tablet   No No   Sig: Take 1 tablet by mouth nightly   sevelamer (RENVELA) 2.4 g PACK packet   No No   Sig: Take 2.4 g by mouth 3 times daily (with meals)   zinc sulfate (ZINCATE) 220 (50 Zn) MG capsule   Yes No   Sig: Take 1 capsule by mouth daily      Facility-Administered Medications: None       Hospital Medications:  Current Facility-Administered Medications   Medication Dose Route Frequency    magnesium sulfate 2000 mg in 50 mL IVPB premix  2,000 mg IntraVENous PRN    sodium phosphate 15 mmol in sodium chloride 0.9 % 250 mL IVPB  15 mmol IntraVENous PRN    dextrose 50 % IV solution  25 g IntraVENous PRN    midodrine (PROAMATINE) tablet 20 mg  20 mg Oral q8h    ipratropium (ATROVENT) 0.02 % nebulizer solution 0.5 mg  0.5 mg  Nebulization q8h    insulin lispro (HUMALOG) injection vial 0-4 Units  0-4 Units SubCUTAneous TID WC    insulin lispro (HUMALOG) injection vial 0-4 Units  0-4 Units SubCUTAneous Nightly    glucose chewable tablet 16 g  4 tablet Oral PRN    dextrose bolus 10% 125 mL  125 mL IntraVENous PRN    Or    dextrose bolus 10% 250 mL  250 mL IntraVENous PRN    glucagon injection 1 mg  1 mg SubCUTAneous PRN    dextrose 10 % infusion   IntraVENous Continuous PRN    [START ON 10/02/2022] oseltamivir (TAMIFLU) capsule 30 mg  30 mg Oral Once per day on Mon Wed Fri    sodium phosphate 15 mmol in sodium chloride 0.9 % 250 mL IVPB  15 mmol IntraVENous Once    heparin (porcine) injection 5,000 Units  5,000 Units SubCUTAneous BID    [START ON 10/02/2022] cefepime (MAXIPIME) 1,000 mg in sodium chloride 0.9 % 50 mL IVPB (mini-bag)  1,000 mg IntraVENous Q24H    Vancomycin - pharmacy to dose   Other RX Placeholder    magnesium sulfate 1000 mg in dextrose 5% 100 mL IVPB  1,000 mg IntraVENous Once    potassium & sodium phosphates (PHOS-NAK) 280-160-250 MG packet 250 mg  1 packet Oral 4x Daily    balsum peru-castor oil (VENELEX) ointment   Topical BID    collagenase ointment   Topical Daily    metronidazole (FLAGYL) 500 mg in 0.9% NaCl 100 mL IVPB premix  500 mg IntraVENous Q8H    norepinephrine (LEVOPHED) 16 mg in sodium chloride 0.9 % 250 mL infusion  1-100 mcg/min IntraVENous Continuous    sodium chloride flush 0.9 % injection 5-40 mL  5-40 mL IntraVENous 2 times per day    sodium chloride flush 0.9 % injection 5-40 mL  5-40 mL IntraVENous PRN    0.9 % sodium chloride infusion   IntraVENous PRN    albuterol (PROVENTIL) (2.5 MG/3ML) 0.083% nebulizer solution 2.5 mg  2.5 mg Nebulization Q6H PRN    acetaminophen (TYLENOL) tablet 650 mg  650 mg Oral Q6H PRN    Or    acetaminophen (TYLENOL) suppository 650 mg  650 mg Rectal Q6H PRN    polyethylene glycol (GLYCOLAX) packet 17 g  17 g Oral Daily PRN    ondansetron (ZOFRAN) injection 4 mg  4 mg  IntraVENous Q6H PRN    pantoprazole (PROTONIX) 40 mg in sodium chloride (PF) 0.9 % 10 mL injection  40 mg IntraVENous Daily    sevelamer (RENVELA) packet 2.4 g  2.4 g Oral TID WC    zinc sulfate (ZINCATE) 220 mg capsule - elemental zinc 50 mg  50 mg Oral Daily    [Held by provider] mirtazapine (REMERON) tablet 7.5 mg  7.5 mg Oral Nightly       Social History:  Social History     Tobacco Use    Smoking status: Never    Smokeless tobacco: Never   Substance Use Topics    Alcohol use: Not Currently       Family History:  No family history on file.    Review of Systems:  Constitutional: negative fever, negative chills, negative weight loss  Eyes:   negative visual changes  ENT:   negative sore throat, tongue or lip swelling  Respiratory:  negative cough, negative dyspnea  Cards:  negative for chest pain, palpitations, lower extremity edema  GI:   See HPI  GU:  negative for frequency, dysuria  Integument:  negative for rash and pruritus  Heme:  negative for easy bruising and gum/nose bleeding  Musculoskeletal:negative for myalgias, back pain and muscle weakness  Neuro:    negative for headaches, dizziness, vertigo  Psych: negative for feelings of anxiety, depression     Objective:   Patient Vitals for the past 8 hrs:   BP Temp Temp src Pulse Resp SpO2 Height   10/01/22 1121 -- -- -- -- -- -- 1.727 m (5' 7.99")   10/01/22 1108 -- -- -- 94 21 96 % --   10/01/22 0900 (!) 108/95 -- -- (!) 108 24 97 % --   10/01/22 0800 93/75 98.2 F (36.8 C) Oral 89 21 -- --     11/30 0701 - 11/30 1900  In: 10 [I.V.:10]  Out: -   11/28 1901 - 11/30 0700  In: 15.8 [I.V.:15.8]  Out: -     EXAM:     CONST:  Pleasant male lying in bed, no acute distress   NEURO:  Alert and oriented x 3   HEENT: EOMI, no scleral icterus   LUNGS: Clear to ausculation, (-) wheeze   CARD:  Regular rate and rhythm, S1 S2   ABD:  Soft, + tenderness, no rebound, + bowel sounds, no masses, non distended   EXT:  No edema, warm   PSYCH: Full, not anxious     Data Review      Recent Labs     2022/10/02  1634 10/01/22  0112   WBC 11.0 12.2*   HGB 9.2* 8.6*   HCT 27.0* 25.0*   PLT 124* 107*     Recent Labs     2022-10-02  0448 02-Oct-2022  1634 10/01/22  0112   NA 136 136 138   K 3.6 3.7 3.8   CL 104 103 104   CO2 28 30 28    BUN 20 11 14    PHOS 1.4*  --  0.8*     Recent Labs     10-02-2022  1634   GLOB 2.6     Recent Labs     10/01/22  0112   INR 2.2*     10/02/22 11/29 FINDINGS: Limited right upper quadrant ultrasound was performed. The liver is  enlarged and heterogeneous with  irregular border. There is perihepatic fluid.  The common bile duct is normal measuring 5 mm in diameter. The gallbladder is  normal. The right kidney measures 9.4 cm in length. It is partially obscured by  bowel gas. There is no hydronephrosis.     IMPRESSION:     1. Cirrhotic liver.  2. No acute abnormality.  3. No biliary ductal dilatation.     Assessment:     Distal colonic wall thickening: Seen on CT as above, suspicious for proctitis. WBC 12.2. Hgb 8.6., PLT 107.  Can consider colonoscopy once medically stable - no need for urgent colonoscopy. On antibiotics. On PPI. Will monitor.   Anemia: Hgb 8.6, PLT 107.  Hypotension: on levophed per chart review  Cirrhosis: [name] as above. Seen by hepatology.   ESRD: MWF HD       Patient Active Problem List   Diagnosis    Gastric perforation (HCC)    Type 2 diabetes mellitus with hyperglycemia, without long-term current use of insulin (HCC)    Intestinal obstruction (HCC)    Severe sepsis (HCC)    Septic shock (HCC)    E coli infection    Liver abscess    Pneumoperitoneum    Acute respiratory failure with hypoxia (HCC)    AKI (acute kidney injury) (HCC)    Multi-organ failure with heart failure (HCC)    Thrombocytopenia (HCC)    Bacteremia due to Escherichia coli    Hepatic abscess    Peripheral arterial disease (HCC)    Hyperbilirubinemia    Gram negative sepsis (HCC)    Septicemia (HCC)    Aspiration pneumonia of both lower lobes (HCC)    Gangrene of toe of both feet (HCC)     Bandemia    Acute pancreatitis    Wound dehiscence    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 Janetta Hora  Thank you for allowing me to participate in care of [facility]       Signed By: Reymundo Poll, PA     10/01/2022  3:51 PM      Agree with above      Will follow

## 2022-10-01 NOTE — H&P (Incomplete)
History and Physical    Date of Service:  10/01/2022  Primary Care Provider: No primary care provider on file.  Source of information: The patient and Chart review    Chief Complaint: Hypotension      History of Presenting Illness:   Jaime [name] is a 76 y.o. male who presents with low blood pressure.  Patient was recently admitted from 11/23 to 11/29 with acute proctitis.  Patient was subsequently discharged to SNF.  On her way to the facility, patient was noted by EMS with low blood pressure with systolic in the 27O.  Patient was brought back to the emergency room and noted with systolic blood pressure in the 50s.  Patient was subsequently placed on Levophed and admitted to ICU.  Patient was also noted to have hypoxic respiratory failure with initial presenting saturations in the 80s.  Patient was also started on nonrebreather on admission.  Workup shows that patient positive for influenza.  Patient is hemodynamically more improved since the admission and today patient transfer out of ICU to assume care under the hospitalist service.    The patient denies any headache, blurry vision, sore throat, trouble swallowing, trouble with speech, chest pain, SOB, cough, fever, chills, N/V/D, abd pain, urinary symptoms, constipation, recent travels, sick contacts, focal or generalized neurological symptoms, falls, injuries, rashes, contact with COVID-19 diagnosed patients, hematemesis, melena, hemoptysis, hematuria, rashes, denies starting any new medications and denies any other concerns or problems besides as mentioned above.         REVIEW OF SYSTEMS:  A comprehensive review of systems was negative except for that written in the History of Present Illness.     Past Medical History:   Diagnosis Date    Diabetes mellitus ([place])     Hemodialysis patient ([place]) 07/12/2022    Hypertension       Past Surgical History:   Procedure Laterality Date    BLADDER SURGERY N/A 06/01/2022    ENDOSCOPY OF ILEAL CONDUIT performed  by Melene Plan, MD at [facility]    CT VISCERAL PERCUTANEOUS DRAIN  06/05/2022    CT VISCERAL PERCUTANEOUS DRAIN 06/05/2022 [facility] CT    FOOT DEBRIDEMENT Bilateral 07/23/2022    BILATERAL TRANSMETATARSAL AMPUTATION performed by Salomon Fick, DPM at [facility]    IR TUNNELED CATHETER PLACEMENT GREATER THAN 5 YEARS  07/01/2022    IR TUNNELED CATHETER PLACEMENT GREATER THAN 5 YEARS 07/01/2022 [place], APRN - NP [facility] ANGIO IR    LAPAROSCOPY N/A 06/01/2022    LAPAROSCOPY DIAGNOSTIC performed by Melene Plan, MD at [place] N/A 06/01/2022    LAPAROTOMY EXPLORATORY performed by Melene Plan, MD at [facility]     Prior to Admission medications    Medication Sig Start Date End Date Taking? Authorizing Provider   midodrine (PROAMATINE) 10 MG tablet Take 1.5 tablets by mouth 3 times daily 09/15/2022   Robby Sermon, MD   metroNIDAZOLE (FLAGYL) 500 MG tablet Take 1 tablet by mouth 2 times daily for 3 days 09/21/2022 10/03/22  Robby Sermon, MD   levoFLOXacin (LEVAQUIN) 500 MG tablet Take 1 tablet by mouth every 48 hours for 4 days 09/10/2022 10-10-22  Robby Sermon, MD   acetaminophen (TYLENOL) 325 MG tablet Take 2 tablets by mouth every 6 hours as needed for Pain    [provider]   zinc sulfate (ZINCATE) 220 (50 Zn) MG capsule Take 1 capsule by mouth daily  [provider]   mirtazapine (REMERON) 7.5 MG tablet Take 1 tablet by mouth nightly 08/03/22   Alain [name], MD   sevelamer (RENVELA) 2.4 g PACK packet Take 2.4 g by mouth 3 times daily (with meals) 08/03/22   Alain [name], MD     Allergies   Allergen Reactions    Augmentin [Amoxicillin-Pot Clavulanate] Hives     Tolerated ceftriaxone 06/2022    Codeine Itching     Unknown reaction    Oxycodone Itching      No family history on file.   Social History:  reports that he has never smoked. He has never used smokeless tobacco. He reports that he does not currently use alcohol.   Social Determinants of  Health     Tobacco Use: Low Risk  (09/17/2022)    Patient History     Smoking Tobacco Use: Never     Smokeless Tobacco Use: Never     Passive Exposure: Not on file   Alcohol Use: Not on file   Financial Resource Strain: Not on file   Food Insecurity: Not on file   Transportation Needs: Not on file   Physical Activity: Not on file   Stress: Not on file   Social Connections: Not on file   Intimate Partner Violence: Not on file   Depression: Not on file   Housing Stability: Not on file   Interpersonal Safety: Not on file   Utilities: Not on file        Medications were reconciled to the best of my ability given all available resources at the time of admission. Route is PO if not otherwise noted.     Family and social history were personally reviewed, all pertinent and relevant details are outlined as above.    Objective:   BP (!) 108/95   Pulse (!) 108   Temp 98.2 F (36.8 C) (Oral)   Resp 24   Wt 80.5 kg (177 lb 7.5 oz)   SpO2 97%   BMI 26.98 kg/m         PHYSICAL EXAM:   General: Alert x oriented x 3, awake, no acute distress,   HEENT: PEERL, EOMI, moist mucus membranes  Neck: Supple, no JVD, no meningeal signs  Chest: Clear to auscultation bilaterally   CVS: RRR, S1 S2 heard, no murmurs/rubs/gallops  Abd: Soft, non-tender, non-distended, +bowel sounds   Ext: No clubbing, no cyanosis, no edema  Neuro/Psych: Pleasant mood and affect, CN 2-12 grossly intact, sensory grossly within normal limit, Strength 5/5 in all extremities, DTR 1+ x 4  Cap refill: Brisk, less than 3 seconds  Pulses: 2+, symmetric in all extremities  Skin: Warm, dry, without rashes or lesions    Data Review:   I have independently reviewed and interpreted patient's lab and all other diagnostic data    Abnormal Labs Reviewed   RAPID INFLUENZA A/B ANTIGENS - Abnormal; Notable for the following components:       Result Value    Influenza A Ag Positive (*)     All other components within normal limits   CBC WITH AUTO DIFFERENTIAL - Abnormal;  Notable for the following components:    RBC 3.19 (*)     Hemoglobin 9.2 (*)     Hematocrit 27.0 (*)     RDW 18.9 (*)     Platelets 124 (*)     Neutrophils % 76 (*)     Immature Granulocytes 1 (*)     Neutrophils Absolute  8.3 (*)     Absolute Immature Granulocyte 0.1 (*)     All other components within normal limits   COMPREHENSIVE METABOLIC PANEL - Abnormal; Notable for the following components:    Anion Gap 3 (*)     Bun/Cre Ratio 9 (*)     Est, Glom Filt Rate 58 (*)     Calcium 7.9 (*)     Total Bilirubin 3.2 (*)     Alk Phosphatase 130 (*)     Total Protein 5.3 (*)     Albumin 2.7 (*)     Albumin/Globulin Ratio 1.0 (*)     All other components within normal limits   LACTIC ACID - Abnormal; Notable for the following components:    Lactic Acid, Plasma 3.1 (*)     All other components within normal limits   LACTIC ACID - Abnormal; Notable for the following components:    Lactic Acid, Plasma 3.1 (*)     All other components within normal limits   BASIC METABOLIC PANEL W/ REFLEX TO MG FOR LOW K - Abnormal; Notable for the following components:    Glucose 62 (*)     Creatinine 1.40 (*)     Bun/Cre Ratio 10 (*)     Est, Glom Filt Rate 52 (*)     Calcium 7.8 (*)     All other components within normal limits   MAGNESIUM - Abnormal; Notable for the following components:    Magnesium 1.5 (*)     All other components within normal limits   PHOSPHORUS - Abnormal; Notable for the following components:    Phosphorus 0.8 (*)     All other components within normal limits   CBC WITH AUTO DIFFERENTIAL - Abnormal; Notable for the following components:    WBC 12.2 (*)     RBC 2.98 (*)     Hemoglobin 8.6 (*)     Hematocrit 25.0 (*)     RDW 19.2 (*)     Platelets 107 (*)     Neutrophils Absolute 9.4 (*)     All other components within normal limits   PROTIME-INR - Abnormal; Notable for the following components:    INR 2.2 (*)     Protime 21.9 (*)     All other components within normal limits   POCT GLUCOSE - Abnormal; Notable for the  following components:    POC Glucose 126 (*)     All other components within normal limits       _0 @    IMAGING:   US ABDOMEN LIMITED Specify organ? GALLBLADDER   Final Result      1. Cirrhotic liver.   2. No acute abnormality.   3. No biliary ductal dilatation.      XR CHEST PORTABLE   Final Result   Hazy density in the mid to lower left hemithorax which may be due to   atelectasis.  There may be a small left-sided pleural effusion.         CT CHEST WO CONTRAST    (Results Pending)        ECG/ECHO:  _1 @       Notes reviewed from all clinical/nonclinical/nursing services involved in patient's clinical care. Care coordination discussions were held with appropriate clinical/nonclinical/ nursing providers based on care coordination needs.     Assessment:   Given the patient's current clinical presentation, there is a high level of concern for decompensation if discharged from the emergency department. Complex decision making was  performed, which includes reviewing the patient's available past medical records, laboratory results, and imaging studies.    Principal Problem:    GI bleed  Resolved Problems:    * No resolved hospital problems. *      Plan:     Shock  -Etiology of the shock is unclear but possibly combination of hypovolemia as well as sepsis, patient with known proctitis with recent admission as well as positive for influenza on this admission  -Patient required vasopressors and ICU admission from 11/29 to 11/30  -Continue with cefepime, add Flagyl, continue midodrine    Acute respiratory failure  -Acute hypoxic respiratory failure, currently on high flow nasal cannula at 40 L  -Chest x-ray shows density in mid to lower left hemithorax  -Check CT of the chest for further evaluation  -Will extend antibiotic coverage for MRSA, patient positive for MRSA screen recently    Proctitis  -Patient recently admitted from 11/23 to 11/29  -GI evaluated the patient, colonoscopy was deferred due to  thrombocytopenia  -Thrombocytopenia is now improving, will reconsult GI for reevaluation whether colonoscopy can be considered this admission    Influenza A  -Continue Tamiflu, droplet isolation    Cirrhosis  -Patient with hyperbilirubinemia on the blood work  -Ultrasound showing cirrhotic liver, no biliary ductal dilatation  -No prior history of liver cirrhosis as per records  -Consult hepatology for further evaluation    Elevated creatinine  -Probable component of ATN from recent hypotension    Electrolyte abnormalities  -Patient with hypomagnesemia and hyper phosphorusemia  -Replace electrolytes    Thrombocytopenia  -Likely from acute infectious process  -Platelet count improving from prior  -Monitor daily CBC    Diabetes type 2  -Insulin sliding scale coverage    End-stage renal disease  -On HD MWF schedule    Anemia of CKD  -On ESA    Sacral decubitus ulcer  -Wound care consult    Estimated length of stay is 2 midnights.    DIET: ADULT DIET; Easy to Chew   ISOLATION PRECAUTIONS: Contact, Droplet  CODE STATUS: Full Code   Central Line: CVC Triple Lumen 09/18/2022 Right Femoral-Central Line Being Utilized: Yes   DVT PROPHYLAXIS: SCDs  FUNCTIONAL STATUS PRIOR TO HOSPITALIZATION: Fully active and ambulatory; able to carry on all self-care without restriction.  Ambulatory status/function: By self   EARLY MOBILITY ASSESSMENT: Recommend an assessment from physical therapy and/or occupational therapy  ANTICIPATED DISCHARGE: 24-48 hours.  ANTICIPATED DISPOSITION: Home  EMERGENCY CONTACT/SURROGATE DECISION MAKER:     CRITICAL CARE WAS PERFORMED FOR THIS ENCOUNTER: YES. I had a face to face encounter with the patient, reviewed and interpreted patient data including clinical events, labs, images, vital signs, I/O's, and examined patient.  I have discussed the case and the plan and management of the patient's care with the consulting services, the bedside nurses and necessary ancillary providers.  This patient has a high  probability of imminent, clinically significant deterioration, which requires the highest level of preparedness to intervene urgently. I participated in the decision-making and personally managed or directed the management of the following life and organ supporting interventions that required my frequent assessment to treat or prevent imminent deterioration.  I personally spent 60 minutes of critical care time.  This is time spent at this critically ill patient's bedside actively involved in patient care as well as the coordination of care and discussions with the patient's family.  This does not include any procedural time which has been billed separately.  Signed By: Gwendlyn Deutscher, MD     October 01, 2022         Please note that this dictation may have been completed with Dragon, the computer voice recognition software.  Quite often unanticipated grammatical, syntax, homophones, and other interpretive errors are inadvertently transcribed by the computer software.  Please disregard these errors.  Please excuse any errors that have escaped final proofreading.

## 2022-10-01 NOTE — Progress Notes (Signed)
Day #1 of Cefepime  Indication:  bloodstream infection  Current regimen:  1000mg  q24h    Recent Labs     10-19-22  0448 10/19/2022  1634 10/01/22  0112   WBC 9.9 11.0 12.2*   CREATININE 1.93* 1.28 1.40*   BUN 20 11 14      Est CrCl: HD MWF  Temp (24hrs), Avg:98.1 F (36.7 C), Min:98 F (36.7 C), Max:98.3 F (36.8 C)    Cultures:   11/29 blood x2: NGTD      Plan: Change to 2g IVP x1, followed by 1000mg  q24h via extended infusion for HD dosing per protocol

## 2022-10-01 NOTE — Care Coordination-Inpatient (Signed)
10/01/22 1649   Readmission Assessment   Number of Days since last admission? 1-7 days   Previous Disposition SNF   Who is being Interviewed Caregiver   What was the patient's/caregiver's perception as to why they think they needed to return back to the hospital? Other (Comment)   Did you visit your Primary Care Physician after you left the hospital, before you returned this time? No   Why weren't you able to visit your PCP? Other (Comment)   Did you see a specialist, such as Cardiac, Pulmonary, Orthopedic Physician, etc. after you left the hospital? No   Who advised the patient to return to the hospital? Skilled Unit   Does the patient report anything that got in the way of taking their medications? No   In our efforts to provide the best possible care to you and others like you, can you think of anything that we could have done to help you after you left the hospital the first time, so that you might not have needed to return so soon? Other (Comment)     Harless Litten, MSW CCM  Care Management

## 2022-10-01 NOTE — Consults (Signed)
[facility]   [address] [place], [place] [address]  Phone: [phone]   [phone]    [email or web address]     Nephrology Progress Note    Patient Name : Jaime [name]     DOB : [birth year]     MRN : [id number]  Date of Admission : 09/25/2022  Date of Servive : 10/01/22    CC:  Follow up for ESRD       Assessment and Plan   ESRD-HD  -Dialyzes MWF at [facility]  -Access: [facility]: Placed 07/01/2022  -HD tomorrow    Recent acute proctitis w/ rectal bleed last admission:  - hgb stable    MBD of CKD  - sCa stable will request Phos level  - on Renvela as an outpt    Anemia in CKD:  - ESA MWF    Chronic hypotension:  -Continue midodrine  -IV albumin PRN  - wean off pressors    Influenza A +:  - on tamiflu    PVD: S/p B/L TMA  DM2  Hx of pancreatitis  Hx of liver abscess s/p drainage  Sacral ulcer     Interval History:  Patient was d/c'd yesterday to [facility] and sent back for hypotension.  He had HD yesterday, NO UF due to hypotension.  On chronic midodrine and albumin with HD.  Admitted to CCU, now flu +, on pressors.  Resting on high flow.      Review of Systems: A comprehensive review of systems was negative.    Current Medications:   Current Facility-Administered Medications   Medication Dose Route Frequency    magnesium sulfate 2000 mg in 50 mL IVPB premix  2,000 mg IntraVENous PRN    sodium phosphate 15 mmol in sodium chloride 0.9 % 250 mL IVPB  15 mmol IntraVENous PRN    dextrose 50 % IV solution  25 g IntraVENous PRN    norepinephrine (LEVOPHED) 16 mg in sodium chloride 0.9 % 250 mL infusion  1-100 mcg/min IntraVENous Continuous    sodium chloride flush 0.9 % injection 5-40 mL  5-40 mL IntraVENous 2 times per day    sodium chloride flush 0.9 % injection 5-40 mL  5-40 mL IntraVENous PRN    0.9 % sodium chloride infusion   IntraVENous PRN    albuterol (PROVENTIL) (2.5 MG/3ML) 0.083% nebulizer solution 2.5 mg  2.5 mg Nebulization Q6H PRN    acetaminophen  (TYLENOL) tablet 650 mg  650 mg Oral Q6H PRN    Or    acetaminophen (TYLENOL) suppository 650 mg  650 mg Rectal Q6H PRN    polyethylene glycol (GLYCOLAX) packet 17 g  17 g Oral Daily PRN    ondansetron (ZOFRAN) injection 4 mg  4 mg IntraVENous Q6H PRN    pantoprazole (PROTONIX) 40 mg in sodium chloride (PF) 0.9 % 10 mL injection  40 mg IntraVENous Daily    oseltamivir (TAMIFLU) capsule 30 mg  30 mg Oral BID    acetaminophen (TYLENOL) tablet 650 mg  650 mg Oral Q6H PRN    levoFLOXacin (LEVAQUIN) tablet 500 mg  500 mg Oral Q48H    midodrine (PROAMATINE) tablet 15 mg  15 mg Oral q8h    sevelamer (RENVELA) packet 2.4 g  2.4 g Oral TID WC    zinc sulfate (ZINCATE) 220 mg capsule - elemental zinc 50 mg  50 mg Oral Daily    metroNIDAZOLE (FLAGYL) tablet 500 mg  500 mg  Oral BID    [Held by provider] mirtazapine (REMERON) tablet 7.5 mg  7.5 mg Oral Nightly      Allergies   Allergen Reactions    Augmentin [Amoxicillin-Pot Clavulanate] Hives     Tolerated ceftriaxone 06/2022    Codeine Itching     Unknown reaction    Oxycodone Itching       Objective:  Vitals:    Vitals:    10/01/22 0600 10/01/22 0700 10/01/22 0800 10/01/22 0900   BP: (!) 113/59 (!) 115/57 93/75 (!) 108/95   Pulse: 89 83 89 (!) 108   Resp: 18 18 21 24    Temp:   98.2 F (36.8 C)    TempSrc:   Oral    SpO2:    97%   Weight:  80.5 kg (177 lb 7.5 oz)       Intake and Output:  11/30 0701 - 11/30 1900  In: 10 [I.V.:10]  Out: -   11/28 1901 - 11/30 0700  In: 15.8 [I.V.:15.8]  Out: -     Physical Examination:  General: Chronically ill appearing, on high flow  Neck:  Supple, no mass  Resp:  Decreased BS no wheezing , normal respiratory effort  CV:  RRR,  no murmur or rub, 1+ b/l LE edema  GI:  Soft, NT, + BS, no HS megaly  Neurologic:  Non focal  Dialysis Access : [facility]    [x]     High complexity decision making was performed  []     Patient is at high-risk of decompensation with multiple organ involvement    Lab Data Personally Reviewed: I have reviewed all the  pertinent labs, microbiology data and radiology studies during assessment.    Labs:  Recent Labs     09/08/2022  0448 09/18/2022  1634 10/01/22  0112   NA 136 136 138   K 3.6 3.7 3.8   CL 104 103 104   CO2 28 30 28    GLUCOSE 105* 78 62*   BUN 20 11 14    CREATININE 1.93* 1.28 1.40*   CALCIUM 8.1* 7.9* 7.8*         Recent Labs     09/10/2022  0448 09/11/2022  1634 10/01/22  0112   WBC 9.9 11.0 12.2*   RBC 2.98* 3.19* 2.98*   HGB 8.7* 9.2* 8.6*   HCT 25.5* 27.0* 25.0*   MCV 85.6 84.6 83.9   MCH 29.2 28.8 28.9   MCHC 34.1 34.1 34.4   RDW 19.0* 18.9* 19.2*   PLT 98* 124* 107*   MPV 11.3 11.2 10.1       Recent Labs     09/28/2022  1634   GLOB 2.6       Recent Labs     10/01/22  0112   INR 2.2*        No results for input(s): "CPK", "CKMB", "TROPONINI" in the last 72 hours.    Invalid input(s): "B-NP"  Invalid input(s): "PHI", "PCO2I", "PO2I", "FIO2I"     Ventilator:       Microbiology:  No results found for: "SDES"  No components found for: "CULT"      I have reviewed the flowsheets.  Chart and Pertinent Notes have been reviewed.   No change in PMH ,family and social history from Consult note.      Criselda Peaches, MD  [facility]

## 2022-10-01 NOTE — Wound Image (Signed)
WOCN Note:   Follow up visit    Jaime [name] is a 76 yr old male with pmhx of htn, pvd, DM 2, ESRD with HD MWF, sacral decubitis ulcer  who presented to ED with c/o rectal bleeding.  Admitted on 09/19/2022     Lab Results   Component Value Date/Time    WBC 12.2 (H) 10/01/2022 01:12 AM    LABA1C 7.7 (H) 06/02/2022 12:22 PM    POCGLU 96 10/01/2022 09:34 AM    POCGLU 126 (H) 10/01/2022 04:14 AM    HGB 8.6 (L) 10/01/2022 01:12 AM    HCT 25.0 (L) 10/01/2022 01:12 AM    PLT 107 (L) 10/01/2022 01:12 AM        Tobacco Use      Smoking status: Never      Smokeless tobacco: Never     ADULT DIET; Easy to Chew     Assessment:   Alert and appropriately communicative. Reports generalized pain.   Moderate - Total assist in repositioning for visual assessment of sacrum  Wearing briefs, having fairly frequent soft stools  Surface: LAL mattress   Left heel intact, with hyperkeratosis and without erythema.  Heels offloaded with boots.  No Palpable DP or PT pulses bilaterally    1. POA Unstageable Pressure Injury to Right Heel  Posterior heel with 1.5 x 1 x <0.1 cm  90% tan, 10% pink   Small serous exudate noted  Periwound intact & with ~ 0.2 cm erythema    Tx: applied hydrocolloid, offloaded in boots     2. POA Wounds to Bilateral Buttocks  Left:     Superior - New 4 x 3.5 x 0.1 cm, 100% thin tan, small serous exudate     Inferior - 3 x 2 x 0.1 cm, 100% thin tan, small serous exudate           Right:    6 x 4 x 0.1 cm,  periwound with 3 x 2 cm deep purple non-blanching area         100% soft adherent tan, small serous exudate    Right lateral buttock with 4 x 2 cm deep purple non-blanching area    Tx: applied foam dressings, plan for Santyl and Venelex     3. POA Healing TMA Site to Left Foot  Three intact dry crusts  Tx: painted with betadine, left open to air    Wound Recommendations:    Right heel: Maintain hydrocolloid. If drainage overwhelms dressing / spontaneous  removal / dressing has been in place for 7 days - remove, cleanse with saline and apply new hydrocolloid  Buttocks: Three Times Daily - apply Santyl to tan tissue in wounds, and Balsam peru-castor oil (Venelex) everywhere else, cover with two foam dressings placed to avoid covering the anus, be sure to apply and cover to purple area on lateral right buttock   Left TMA: Daily - Paint with betadine, leave open to air     PI Prevention:  Turn/reposition approximately every 2 hours  Offload heels with heels hanging off end of pillow at all times while in bed.  Sacral Foam dressing: lift to assess regularly; change as needed. Discontinue if incontinence is frequently soiling dressing.     Low Air Loss mattress  ordered. Use only flat sheet and one incontinence pad.     Discussed assessment and plan with Dr. Milana Huntsman and RN, Ranell Patrick.    Transition of Care: Plan to follow weekly and as needed while admitted  to hospital.      Lawson Radar  MSN, RN  [facility]   Available through [place]

## 2022-10-01 NOTE — Consults (Addendum)
[place], MD, FACP, [place], [place]      [name], PA-C    [name], AGPCNP-BC   [name], ACNPC-AG   [name], FNP-C  [name], FNP-C   [name], AGPCNP-BC   Lauro Franklin, [facility]      [place]   at [facility]   [address] [place]  [address]   [phone]   FAX: [phone]  [facility]   at [facility]   [address] [place]  [address]   [phone]   FAX: [phone]       HEPATOLOGY CONSULT NOTE  I was asked to see this patient in consultation for evaluation and management of cirrhosis.  I have reviewed the Emergency room note, Hospital admission note, Notes by all other physicians who have seen the patient during this hospitalization to date.  I have reviewed the problem list, all past medical history and the reason for this hospitalization.  I have reviewed the allergies and the medications the patient was taking at home prior to this hospitalization.    HISTORY:  The patient is a 76 y.o. male with history HTN, T2DM, ESRD on HD who was brought back to the ED for rectal bleeding and hypotension while he was on his way to SNF after hospital DC.    He was thought to have bowel perforation due to free air in peritoneum and underwent ex-lap without any obvious bowel perforation in 05/2022.    He was found to have a liver abscess that was drained in 05/2022.  That is probably where the free air came from.  He was treated with IV ABX.  Catheter was exchanged and finally removed in 07/2022.    CT can of the abdomen in 09/2022 demonstrated the abscess had resolved.    There is no previous history of liver disease.  Serology for causes of chronic liver disease wee negative for HBV.    He is currently in the ICU on presser support.    The liver transaminases  are normal.  ALP is mildly elevated, TBILI is mildly elevated at 3.2 mg, albumin is low, the platelet count is depressed    US of the liver suggested the surface was nodular suggesting cirrhosis.  The liver abscess was not seen.  Normal bile ducts    Chest CT suggested hypodensity of the liver suggesting steatotic liver disease (SLD)    Hepatology Plan:  Serologic studies for other causes of chronic liver disease.  DBILI    ASSESSMENT AND PLAN:  Elevated liver enzymes  Elevation in alkaline phosphatase has been intermittently elevated since 05/2022 and is actually lower now that in the past.     The TBILI has also been intermittently elevated in the past and this apepars to occur with worsening of systemic illness such as infection.    Just a few days ago the TBILI was 1.4 with DBILI of 0.3 mg.  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  Will monitor TBILI and get a repeat DBILI     Liver transaminases are normal.  Total bilirubin is elevated.      There are multiple CT scans of the liver which have never suggested cirrhosis as recently  as 09/2022.  Only the ultrasound suggested cirrhosis.      Will perform additional serologic tests to screen for other causes of chronic liver disease.    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      Anemia   This is due to multifactorial causes including acute rectal bleeding and ESRD on dialysis  The most recent HB is 8.6 gms.    Thrombocytopenia   This is secondary to uncertain etiology.  I do not suspect this is due to cirrhosis  The platelet count is adequate for the patient to undergo procedures without the need for platelet transfusion or  platelet growth factors.      SYSTEM REVIEW:  Constitution systems: I am tired and worn out.    Eyes: Negative for visual changes.  ENT: Negative for sore throat, painful swallowing.   Respiratory: SOB.   Cardiology: Negative for chest pain, palpitations.  GI:  Negative for constipation or diarrhea.  GU: Negative for urinary frequency, dysuria, hematuria, nocturia.   Skin: Negative for rash.  Hematology: Negative for easy bruising, blood clots.    Musculo-skelatal: Amputation of toes bilaterally.    Neurologic: Negative for headaches, dizziness, vertigo, memory problems not related to HE.  Psychology: Negative for anxiety, depression.     FAMILY HISTORY:  There is no family history of liver disease.      SOCIAL HISTORY:  The patient is married.    The patient has 1 child.    The patient has never used tobacco products.    The patient has never consumed significant amounts of alcohol.      PHYSICAL EXAMINATION:  VS: per nursing note  General:  No acute distress.   Eyes:  Sclera anicteric.   ENT:  No oral lesions.  Thyroid normal.  Nodes:  No adenopathy.   Skin:  No spider angiomata.  No jaundice.  Respiratory:  High flow O2 nasal canula.    Cardiovascular:  Regular heart rate.  Abdomen:  Soft non-tender, No obvious ascites.  Extremities:  No lower extremity edema.  No muscle wasting.  Amputation of toes bilaterally  Neurologic:  Lethargic.  Cranial nerves grossly intact.  No asterixis.    LABORATORY:   Latest Reference Range & Units 09/29/22 07:10 09/12/2022 04:48 09/19/2022 16:34 10/01/22 01:12   Sodium 136 - 145 mmol/L 137 136 136 138   Potassium 3.5 - 5.1 mmol/L 4.1 3.6 3.7 3.8   Chloride 97 - 108 mmol/L 105 104 103 104   CO2 21 - 32 mmol/L _0 BUN,BUNPL 6 - 20 MG/DL _1 Creatinine 0.70 - 1.30 MG/DL 1.57 (H) 1.93 (H) 1.28 1.40 (H)   Albumin 3.5 - 5.0 g/dL  2.2 (L) 2.7 (L)    Alk Phos 45 - 117 U/L   130 (H)    ALT 12 - 78 U/L   14    AST 15 - 37 U/L   27    BILIRUBIN TOTAL 0.2 - 1.0 MG/DL   3.2 (H)     WBC 4.1 - 11.1 K/uL 6.1 9.9 11.0 12.2 (H)   Hemoglobin Quant 12.1 - 17.0 g/dL 8.3 (L) 8.7 (L) 9.2 (L) 8.6 (  L)   Platelet Count 150 - 400 K/uL 17 (LL) 98 (L) 124 (L) 107 (L)   INR 0.9 - 1.1      2.2 (H)   (LL): Data is critically low  (H): Data is abnormally high  (L): Data is abnormally low      Thedore Mins, MD  [facility], [place]  [address]  [phone]  [place]

## 2022-10-01 NOTE — Progress Notes (Addendum)
HISTORY OF PRESENT ILLNESS: 76 y.o. male with PMHx of ESRD on HD (MWF at [place] home), acute proctitis with rectal bleeding requiring admission from [facility] 1 week ago and discharged on 09/06/2022, anemia, DM 2, PVD, liver abscess, pancreatitis, and chronic sacral ulcer who presented to the emergency department from his nursing home for evaluation of hypotension. Patient was being discharged to SNF and on way to facility, per EMS, pressures were 80s over 50s on their arrival. They brought him back to [facility] ED for evaluation of hypotension. Patient's blood pressures were 80s over 50s in the ED. A femoral central line was placed and patient was started on levophed. He is also required supplemental oxygen, satting in the mid 80s on room air and around 90 with nasal cannula.  Now on nonrebreather.  Work-up notable for positive influenza swab and hyperbilirubinemia.  Right upper quadrant ultrasound ordered.     Interval history    11/30 - Still requiring pressors/HFNC, CT chest showed large L effusion, also showed known perihepatic fluid collection      Current Facility-Administered Medications:     magnesium sulfate 2000 mg in 50 mL IVPB premix, 2,000 mg, IntraVENous, PRN, Robet Leu, MD, Stopped at 10/01/22 1456    sodium phosphate 15 mmol in sodium chloride 0.9 % 250 mL IVPB, 15 mmol, IntraVENous, PRN, Robet Leu, MD, Stopped at 10/01/22 0629    dextrose 50 % IV solution, 25 g, IntraVENous, PRN, Robet Leu, MD, 25 g at 10/01/22 0400    midodrine (PROAMATINE) tablet 20 mg, 20 mg, Oral, q8h, Cadynce Garrette B, MD, 20 mg at 10/01/22 1311    ipratropium (ATROVENT) 0.02 % nebulizer solution 0.5 mg, 0.5 mg, Nebulization, q8h, Tanishi Nault B, MD, 0.5 mg at 10/01/22 1108    insulin lispro (HUMALOG) injection vial 0-4 Units, 0-4 Units, SubCUTAneous, TID WC, Rohin Krejci B, MD    insulin lispro (HUMALOG) injection vial 0-4 Units, 0-4 Units, SubCUTAneous, Nightly, Lylah Lantis,  Shailen Thielen B, MD    glucose chewable tablet 16 g, 4 tablet, Oral, PRN, Meklit Cotta B, MD    dextrose bolus 10% 125 mL, 125 mL, IntraVENous, PRN **OR** dextrose bolus 10% 250 mL, 250 mL, IntraVENous, PRN, Keanthony Poole B, MD    glucagon injection 1 mg, 1 mg, SubCUTAneous, PRN, Mickie Hillier, Unique Searfoss B, MD    dextrose 10 % infusion, , IntraVENous, Continuous PRN, Thalia Party, MD    [START ON 10/02/2022] oseltamivir (TAMIFLU) capsule 30 mg, 30 mg, Oral, Once per day on Mon Wed Fri, Matthew, Dellyn R, NP-C    sodium phosphate 15 mmol in sodium chloride 0.9 % 250 mL IVPB, 15 mmol, IntraVENous, Once, Tannon Peerson B, MD, Last Rate: 62.5 mL/hr at 10/01/22 1257, 15 mmol at 10/01/22 1257    heparin (porcine) injection 5,000 Units, 5,000 Units, SubCUTAneous, BID, Emeline Darling B, MD, 5,000 Units at 10/01/22 1259    [COMPLETED] ceFEPIme (MAXIPIME) 2,000 mg in sterile water 20 mL IV syringe, 2,000 mg, IntraVENous, Once, 2,000 mg at 10/01/22 1258 **FOLLOWED BY** [START ON 10/02/2022] cefepime (MAXIPIME) 1,000 mg in sodium chloride 0.9 % 50 mL IVPB (mini-bag), 1,000 mg, IntraVENous, Q24H, Drevon Plog B, MD    Vancomycin - pharmacy to dose, , Other, RX Placeholder, Emeline Darling B, MD    magnesium sulfate 1000 mg in dextrose 5% 100 mL IVPB, 1,000 mg, IntraVENous, Once, Zaw, Kyaw T, MD    potassium & sodium phosphates (PHOS-NAK) 280-160-250 MG packet 250 mg, 1 packet, Oral, 4x Daily, Zaw,  Oralia Manis, MD, 250 mg at 10/01/22 1259    balsum peru-castor oil (VENELEX) ointment, , Topical, BID, Zaw, Kyaw T, MD, Given at 10/01/22 1304    collagenase ointment, , Topical, Daily, Zaw, Kyaw T, MD, Given at 10/01/22 1304    metronidazole (FLAGYL) 500 mg in 0.9% NaCl 100 mL IVPB premix, 500 mg, IntraVENous, Q8H, Nishant Schrecengost B, MD, Stopped at 10/01/22 1409    norepinephrine (LEVOPHED) 16 mg in sodium chloride 0.9 % 250 mL infusion, 1-100 mcg/min, IntraVENous, Continuous, Tamsen Meek, MD, Last  Rate: 3.8 mL/hr at 10/01/22 0847, 4 mcg/min at 10/01/22 0847    sodium chloride flush 0.9 % injection 5-40 mL, 5-40 mL, IntraVENous, 2 times per day, Theodore Demark R, NP-C, 10 mL at 10/01/22 6962    sodium chloride flush 0.9 % injection 5-40 mL, 5-40 mL, IntraVENous, PRN, Theodore Demark R, NP-C    0.9 % sodium chloride infusion, , IntraVENous, PRN, Theodore Demark R, NP-C    albuterol (PROVENTIL) (2.5 MG/3ML) 0.083% nebulizer solution 2.5 mg, 2.5 mg, Nebulization, Q6H PRN, Theodore Demark R, NP-C    acetaminophen (TYLENOL) tablet 650 mg, 650 mg, Oral, Q6H PRN **OR** acetaminophen (TYLENOL) suppository 650 mg, 650 mg, Rectal, Q6H PRN, Rodman Key, Dellyn R, NP-C    polyethylene glycol (GLYCOLAX) packet 17 g, 17 g, Oral, Daily PRN, Theodore Demark R, NP-C    ondansetron (ZOFRAN) injection 4 mg, 4 mg, IntraVENous, Q6H PRN, Rodman Key, Dellyn R, NP-C    pantoprazole (PROTONIX) 40 mg in sodium chloride (PF) 0.9 % 10 mL injection, 40 mg, IntraVENous, Daily, Theodore Demark R, NP-C, 40 mg at 10/01/22 0836    sevelamer (RENVELA) packet 2.4 g, 2.4 g, Oral, TID WC, Matthew, Dellyn R, NP-C, 2.4 g at 10/01/22 1304    zinc sulfate (ZINCATE) 220 mg capsule - elemental zinc 50 mg, 50 mg, Oral, Daily, Theodore Demark R, NP-C, 50 mg at 10/01/22 0836    [Held by provider] mirtazapine (REMERON) tablet 7.5 mg, 7.5 mg, Oral, Nightly, Matthew, Dellyn R, NP-C    PHYSICAL EXAMINATION:  General - In mild resp distress  Neuro - Generalized weakness  CV - irregular  Lungs - coarse BL  Abd - soft, NT, ND  Ext - warm    VITAL SIGNS:  Vitals:    10/01/22 1108   BP:    Pulse: 94   Resp: 21   Temp:    SpO2: 96%       LABORATORY ANALYSIS:  _0 @  Admission on 09/28/2022   Component Date Value    WBC 09/15/2022 11.0     RBC 09/09/2022 3.19 (L)     Hemoglobin 09/22/2022 9.2 (L)     Hematocrit 09/23/2022 27.0 (L)     MCV 09/18/2022 84.6     MCH 09/14/2022 28.8     MCHC 09/11/2022 34.1     RDW 09/29/2022 18.9 (H)     Platelets 09/27/2022 124 (L)      MPV 09/13/2022 11.2     Nucleated RBCs 09/22/2022 0.0     nRBC 09/29/2022 0.00     Neutrophils % 09/02/2022 76 (H)     Lymphocytes % 09/15/2022 15     Monocytes % 09/19/2022 7     Eosinophils % 09/11/2022 0     Basophils % 09/21/2022 0     Immature Granulocytes 09/23/2022 1 (H)     Neutrophils Absolute 09/26/2022 8.3 (H)     Lymphocytes Absolute 09/04/2022 1.7     Monocytes Absolute 09/29/2022 0.8  Eosinophils Absolute 09/02/2022 0.0     Basophils Absolute 09/04/2022 0.0     Absolute Immature Granul* 09/14/2022 0.1 (H)     Differential Type 09/11/2022 AUTOMATED     Sodium 09/02/2022 136     Potassium 09/09/2022 3.7     Chloride 09/02/2022 103     CO2 09/13/2022 30     Anion Gap 09/16/2022 3 (L)     Glucose 09/18/2022 78     BUN 09/29/2022 11     Creatinine 09/23/2022 1.28     Bun/Cre Ratio 09/20/2022 9 (L)     Est, Glom Filt Rate 09/23/2022 58 (L)     Calcium 09/13/2022 7.9 (L)     Total Bilirubin 09/11/2022 3.2 (H)     ALT 09/26/2022 14     AST 09/20/2022 27     Alk Phosphatase 09/15/2022 130 (H)     Total Protein 09/08/2022 5.3 (L)     Albumin 09/14/2022 2.7 (L)     Globulin 09/29/2022 2.6     Albumin/Globulin Ratio 09/29/2022 1.0 (L)     Ventricular Rate 09/03/2022 105     Atrial Rate 09/27/2022 105     P-R Interval 09/13/2022 158     QRS Duration 09/17/2022 72     Q-T Interval 09/13/2022 366     QTc Calculation (Bazett) 09/22/2022 483     P Axis 09/14/2022 30     R Axis 09/04/2022 0     T Axis 09/13/2022 44     Diagnosis 09/20/2022                      Value:Sinus tachycardia  Nonspecific T wave abnormality  Abnormal ECG  When compared with ECG of 26-Sep-2022 09:12,  Vent. rate has increased BY  36 BPM  Confirmed by Cleophas Dunker [phone]) on 09/03/2022 4:35:10 PM      Specimen HOld 09/09/2022 1red 1blue     Comment: 09/26/2022 Add-on orders for these samples will be processed based on acceptable specimen integrity and analyte stability, which may vary by analyte.     Lactic Acid, Plasma 09/22/2022 3.1 (HH)      Special Requests 09/09/2022                      Value:NO SPECIAL REQUESTS  MEDIAL PORT      Culture 10/01/2022 NO GROWTH <24 HRS     Special Requests 09/28/2022                      Value:NO SPECIAL REQUESTS  MEDIAL PORT      Culture 09/03/2022 NO GROWTH <24 HRS     Source 09/07/2022 Nasopharyngeal     SARS-CoV-2, Rapid 09/13/2022 Not detected     Influenza A Ag 09/06/2022 Positive (A)     Influenza B Ag 09/12/2022 Negative     Lactic Acid, Plasma 09/21/2022 3.1 (HH)     Sodium 10/01/2022 138     Potassium 10/01/2022 3.8     Chloride 10/01/2022 104     CO2 10/01/2022 28     Anion Gap 10/01/2022 6     Glucose 10/01/2022 62 (L)     BUN 10/01/2022 14     Creatinine 10/01/2022 1.40 (H)     Bun/Cre Ratio 10/01/2022 10 (L)     Est, Glom Filt Rate 10/01/2022 52 (L)     Calcium 10/01/2022 7.8 (L)     Magnesium 10/01/2022 1.5 (L)  Phosphorus 10/01/2022 0.8 (LL)     WBC 10/01/2022 12.2 (H)     RBC 10/01/2022 2.98 (L)     Hemoglobin 10/01/2022 8.6 (L)     Hematocrit 10/01/2022 25.0 (L)     MCV 10/01/2022 83.9     MCH 10/01/2022 28.9     MCHC 10/01/2022 34.4     RDW 10/01/2022 19.2 (H)     Platelets 10/01/2022 107 (L)     MPV 10/01/2022 10.1     Nucleated RBCs 10/01/2022 0.0     nRBC 10/01/2022 0.00     Neutrophils % 10/01/2022 75     Band Neutrophils 10/01/2022 2     Lymphocytes % 10/01/2022 15     Monocytes % 10/01/2022 8     Eosinophils % 10/01/2022 0     Basophils % 10/01/2022 0     Immature Granulocytes 10/01/2022 0     Neutrophils Absolute 10/01/2022 9.4 (H)     Lymphocytes Absolute 10/01/2022 1.8     Monocytes Absolute 10/01/2022 1.0     Eosinophils Absolute 10/01/2022 0.0     Basophils Absolute 10/01/2022 0.0     Absolute Immature Granul* 10/01/2022 0.0     Differential Type 10/01/2022 MANUAL     RBC Comment 10/01/2022                      Value:ANISOCYTOSIS  1+      RBC Comment 10/01/2022                      Value:TARGET CELLS  1+      INR 10/01/2022 2.2 (H)     Protime 10/01/2022 21.9 (H)     Lactic Acid,  Plasma 10/01/2022 1.9     POC Glucose 10/01/2022 126 (H)     Performed by: 10/01/2022 Fleeta Emmer Erin     POC Glucose 10/01/2022 96     Performed by: 10/01/2022 VASSELL Norris     POC Glucose 10/01/2022 76     Performed by: 10/01/2022 YAUCHZY REBECCA(RN)      Encounter Date: 09/25/2022   EKG 12 Lead   Result Value    Ventricular Rate 105    Atrial Rate 105    P-R Interval 158    QRS Duration 72    Q-T Interval 366    QTc Calculation (Bazett) 483    P Axis 30    R Axis 0    T Axis 44    Diagnosis      Sinus tachycardia  Nonspecific T wave abnormality  Abnormal ECG  When compared with ECG of 26-Sep-2022 09:12,  Vent. rate has increased BY  36 BPM  Confirmed by Cleophas Dunker [id number]) on 09/09/2022 4:35:10 PM         RADIOGRAPHIC STUDIES:  CT CHEST WO CONTRAST  Narrative: INDICATION: Pleural effusions    COMPARISON: CT chest June 14, 2022. Chest radiograph September 30, 2022. CT of  the abdomen/pelvis September 26, 2022.    CONTRAST: None.    TECHNIQUE:  5 mm axial images were obtained through the thorax. Coronal and  sagittal reformats were generated.  CT dose reduction was achieved through use  of a standardized protocol tailored for this examination and automatic exposure  control for dose modulation.    The absence of intravenous contrast reduces the sensitivity for evaluation of  the mediastinum, hila, vasculature, and upper abdominal organs.    FINDINGS:    CHEST WALL: No mass or axillary  lymphadenopathy. There is a RIGHT IJ dialysis  catheter in place.  THYROID: No nodule.  MEDIASTINUM: No mass or lymphadenopathy.  HILA: No mass or lymphadenopathy.  THORACIC AORTA: No aneurysm.  MAIN PULMONARY ARTERY: Normal in caliber.  TRACHEA/BRONCHI: Patent.  ESOPHAGUS: No wall thickening or dilatation.  HEART: Normal in size. Coronary artery calcium: present  PLEURA: There is a large left-sided pleural effusion. There is volume loss of  the LEFT lower lobe and mild atelectasis of the LEFT upper lobe. There is a  small  right-sided pleural effusion. There is no pneumothorax.  LUNGS: There is bilateral lower lobe volume loss. There are patchy airspace  infiltrates bilaterally consistent with multifocal pneumonia. The consolidation  in the lingula has small cavitary spaces.  INCIDENTALLY IMAGED UPPER ABDOMEN: Profound hypodensity of the liver consistent  with steatosis. Vicarious excretion of contrast is seen in the gallbladder.  There is extraluminal hyperdensity seen along the anterior surface of the liver  and adjacent to the gastric antrum at site of previously existing surgical  drains.  BONES: No destructive bone lesion.  Impression: 1. Large left-sided pleural effusion and small right-sided effusion. There is  bilateral lower lobe volume loss.    2. Bilateral lung infiltrates consistent with multifocal pneumonia. Small  consolidation in the lingula demonstrates small cavitations.    3. Hyperdensity in the RIGHT upper quadrant of the abdomen may represent  extraluminal oral contrast indicative of a leak versus postsurgical change.  There is an associated 3.5 cm fluid collection at the site of previously  existing surgical drains.    4. Hypodensity of the liver consistent with hepatic steatosis.      ASSESSMENT:  76 y.o. male with PMHx of ESRD on HD (MWF at [place] home), chronic hypotension, acute proctitis with rectal bleeding, anemia, DM 2, PVD, perforated viscus s/p ex lap c/b liver abscess, pancreatitis, and chronic sacral ulcer being managed for presumed septic shock, influenza +Ve, acute hypoxic resp failure      IMPRESSION AND PLAN:    Neuro - Delirium prevention strategies    CV - Hypotension/shock  Cont HD monitoring, MAP goal > 60 for now, on levo - wean as tolerated, increase midodrine dose, recent echo sig for DD    Pulm - Hypoxic resp failure, BL effusions, BL LL atelectasis, multifocal PNA, flu +ve  Supplemental o2 as needed, sat goal > 92%, on HFNC, at risk of needing to be intubated, needs L thora -  IR consulted, ensure chest PT, cont Tamiflu, cont Bsabx for now    GI - Cirrhosis, Perihepatic fluid collection  Diet as tolerated, serial abd exams, ? Needs collection drained     Renal - ESRD on HD  Dose meds renally, correct electrolyte derangements as needed, HD per renal    Heme - heparin for DVT ppx    ID - Presumed sepsis unclear source, WBC WNL, flu +ve  Escalate abx for now, f/u micro studies, cont Tamiflu    Endo - Keep glu < 180    Pt with vital organ dysfunction  High risk of complications, morbidity, mortality  CCT 40 mins

## 2022-10-01 NOTE — Progress Notes (Signed)
Pharmacist Note - Vancomycin Dosing (Hemodialysis Patient)    Consult provided for this 76 y.o. male for indication of bloodstream infection (Restart - received 11/26-11/27).  This patient is also on the following antibiotic regimen(s): vancomycin, cefepime, Tamiflu  Patient on vancomycin PTA? NO -- patient did receive loading dose and 1 maintenance dose earlier this week prior to discharging and returning to [facility]    Lab Results   Component Value Date/Time    WBC 12.2 10/01/2022 01:12 AM    BUN 14 10/01/2022 01:12 AM   Temp (24hrs), Avg:98.1 F (36.7 C), Min:98 F (36.7 C), Max:98.3 F (36.8 C)      Estimated CrCl:  ESRD on HD (Monday/Wednesday/Friday)  Cultures:  11/29 blood x2: NGTD, prelim    MRSA Swab ordered (if applicable)? YES (11/26 - positive)    For this HD patient, will initiate therapy with Vancomycin 1000 mg x1. Patient received last dose of vancomycin on 11/27 post-HD. Patient would have been due for next dose yesterday evening after HD. Since patient still close to when last dose was due and has some residual renal function, will omit re-loading patient and do a slightly higher maintenance dose x1 today. Next HD planned for tomorrow.    This will be followed with a dose of 750 mg after each HD session. Dose will be adjusted to maintain a pre-HD trough of approximately 15 - 20 mcg/mL for therapeutic goal of 10 - 15 mcg/mL as approximately 35% of drug will be removed by HD filtration.  Pharmacy to follow patient daily and order levels / make dose adjustments as appropriate.

## 2022-10-02 ENCOUNTER — Inpatient Hospital Stay: Payer: MEDICARE

## 2022-10-02 ENCOUNTER — Inpatient Hospital Stay: Admit: 2022-10-02 | Payer: MEDICARE

## 2022-10-02 LAB — BASIC METABOLIC PANEL W/ REFLEX TO MG FOR LOW K
Anion Gap: 7 mmol/L (ref 5–15)
BUN/Creatinine Ratio: 10 — ABNORMAL LOW (ref 12–20)
BUN: 19 MG/DL (ref 6–20)
CO2: 27 mmol/L (ref 21–32)
Calcium: 8 MG/DL — ABNORMAL LOW (ref 8.5–10.1)
Chloride: 102 mmol/L (ref 97–108)
Creatinine: 1.97 MG/DL — ABNORMAL HIGH (ref 0.70–1.30)
Est, Glom Filt Rate: 35 mL/min/{1.73_m2} — ABNORMAL LOW (ref >60)
Glucose: 98 mg/dL (ref 65–100)
Potassium: 4.1 mmol/L (ref 3.5–5.1)
Sodium: 136 mmol/L (ref 136–145)

## 2022-10-02 LAB — CBC WITH AUTO DIFFERENTIAL
Basophils %: 0 % (ref 0–1)
Basophils Absolute: 0 10*3/uL (ref 0.0–0.1)
Eosinophils %: 0 % (ref 0–7)
Eosinophils Absolute: 0 10*3/uL (ref 0.0–0.4)
Hematocrit: 25.7 % — ABNORMAL LOW (ref 36.6–50.3)
Hemoglobin: 8.7 g/dL — ABNORMAL LOW (ref 12.1–17.0)
Immature Granulocytes %: 0 %
Immature Granulocytes Absolute: 0 10*3/uL
Lymphocytes %: 12 % (ref 12–49)
Lymphocytes Absolute: 1.4 10*3/uL (ref 0.8–3.5)
MCH: 28.8 PG (ref 26.0–34.0)
MCHC: 33.9 g/dL (ref 30.0–36.5)
MCV: 85.1 FL (ref 80.0–99.0)
MPV: 10.7 FL (ref 8.9–12.9)
Monocytes %: 8 % (ref 5–13)
Monocytes Absolute: 0.9 10*3/uL (ref 0.0–1.0)
Neutrophils %: 80 % — ABNORMAL HIGH (ref 32–75)
Neutrophils Absolute: 9.5 10*3/uL — ABNORMAL HIGH (ref 1.8–8.0)
Nucleated RBCs: 0 PER 100 WBC
Platelets: 106 10*3/uL — ABNORMAL LOW (ref 150–400)
RBC: 3.02 M/uL — ABNORMAL LOW (ref 4.10–5.70)
RDW: 20 % — ABNORMAL HIGH (ref 11.5–14.5)
WBC: 11.8 10*3/uL — ABNORMAL HIGH (ref 4.1–11.1)
nRBC: 0 10*3/uL (ref 0.00–0.01)

## 2022-10-02 LAB — LACTATE DEHYDROGENASE, BODY FLUID: LD, Fluid: 539 U/L

## 2022-10-02 LAB — BODY FLUID CELL COUNT
Lymphocytes, Body Fluid: 10 % — AB
Mono/Macro, Fluid: 20 % — AB
Polys, Fluid: 70 % — AB
RBC, Fluid: >100 /mm3 — ABNORMAL HIGH
Total Nucleated Cell Count: 12103 /mm3

## 2022-10-02 LAB — TYPE AND SCREEN
ABO/Rh: A POS
Antibody Screen: NEGATIVE
Dispense Status Blood Bank: TRANSFUSED
Unit Divison: 0

## 2022-10-02 LAB — POCT GLUCOSE
POC Glucose: 70 mg/dL (ref 65–117)
POC Glucose: 74 mg/dL (ref 65–117)
POC Glucose: 79 mg/dL (ref 65–117)
POC Glucose: 80 mg/dL (ref 65–117)

## 2022-10-02 LAB — GLUCOSE, BODY FLUID: Glucose, Body Fluid: 73 MG/DL

## 2022-10-02 LAB — PROTEIN, BODY FLUID: Total Protein, Body Fluid: 2.6 g/dL

## 2022-10-02 LAB — PHOSPHORUS: Phosphorus: 2.6 MG/DL (ref 2.6–4.7)

## 2022-10-02 LAB — MAGNESIUM: Magnesium: 2.2 mg/dL (ref 1.6–2.4)

## 2022-10-02 LAB — PH, BODY FLUID: pH, Fluid: 7.2

## 2022-10-02 MED ORDER — HEPARIN (PORCINE) 1000 UNITS/ML INJECTION FOR DIALYSIS CATHETER LOCK
1000 UNIT/ML | INTRAMUSCULAR | Status: AC | PRN
Start: 2022-10-02 — End: 2022-10-04
  Administered 2022-10-02 – 2022-10-04 (×2): 1800 [IU]

## 2022-10-02 MED ORDER — VIAL2BAG ADAPTOR (20 MM)
750 MG | Freq: Once | INTRAVENOUS | Status: AC
Start: 2022-10-02 — End: 2022-10-02
  Administered 2022-10-02: 19:00:00 750 mg via INTRAVENOUS

## 2022-10-02 MED ORDER — IPRATROPIUM BROMIDE 0.02 % IN SOLN
0.02 % | Freq: Three times a day (TID) | RESPIRATORY_TRACT | Status: AC
Start: 2022-10-02 — End: 2022-10-04
  Administered 2022-10-02 – 2022-10-04 (×4): 0.5 mg via RESPIRATORY_TRACT

## 2022-10-02 MED ORDER — DEXTROSE 5 % IV SOLN
5 % | INTRAVENOUS | Status: DC
Start: 2022-10-02 — End: 2022-10-02
  Administered 2022-10-02: 04:00:00 via INTRAVENOUS

## 2022-10-02 MED ORDER — ALBUMIN HUMAN 25 % IV SOLN
25 % | INTRAVENOUS | Status: AC | PRN
Start: 2022-10-02 — End: 2022-10-04
  Administered 2022-10-02: 11:00:00 25 g via INTRAVENOUS

## 2022-10-02 MED ORDER — NOREPINEPHRINE-SODIUM CHLORIDE 16-0.9 MG/250ML-% IV SOLN
INTRAVENOUS | Status: DC
Start: 2022-10-02 — End: 2022-10-03
  Administered 2022-10-03: 08:00:00 30 ug/min via INTRAVENOUS
  Administered 2022-10-03: 15:00:00 50 ug/min via INTRAVENOUS

## 2022-10-02 MED ORDER — HYDROMORPHONE HCL PF 1 MG/ML IJ SOLN
1 MG/ML | INTRAMUSCULAR | Status: DC | PRN
Start: 2022-10-02 — End: 2022-10-04
  Administered 2022-10-02: 21:00:00 1 mg via INTRAVENOUS

## 2022-10-02 MED ORDER — DEXTROSE 10 % IV SOLN
10 % | INTRAVENOUS | Status: DC
Start: 2022-10-02 — End: 2022-10-04
  Administered 2022-10-02 – 2022-10-03 (×2): via INTRAVENOUS

## 2022-10-02 MED ORDER — HYDROCODONE-ACETAMINOPHEN 5-325 MG PO TABS
5-325 | Freq: Four times a day (QID) | ORAL | Status: DC | PRN
Start: 2022-10-02 — End: 2022-10-04

## 2022-10-02 MED ORDER — NORMAL SALINE FLUSH 0.9 % IV SOLN
0.9 % | Freq: Two times a day (BID) | INTRAVENOUS | Status: DC
Start: 2022-10-02 — End: 2022-10-04
  Administered 2022-10-03 – 2022-10-04 (×3): 10 mL via INTRAVENOUS

## 2022-10-02 MED FILL — HYDROMORPHONE HCL 1 MG/ML IJ SOLN: 1 MG/ML | INTRAMUSCULAR | Qty: 1

## 2022-10-02 MED FILL — ZINC-220 220 (50 ZN) MG PO CAPS: 220 (50 Zn) MG | ORAL | Qty: 1

## 2022-10-02 MED FILL — OSELTAMIVIR PHOSPHATE 30 MG PO CAPS: 30 MG | ORAL | Qty: 1

## 2022-10-02 MED FILL — PHOS-NAK 280-160-250 MG PO PACK: 280-160-250 MG | ORAL | Qty: 1

## 2022-10-02 MED FILL — NORMAL SALINE FLUSH 0.9 % IV SOLN: 0.9 % | INTRAVENOUS | Qty: 40

## 2022-10-02 MED FILL — MIDODRINE HCL 5 MG PO TABS: 5 MG | ORAL | Qty: 4

## 2022-10-02 MED FILL — HEPARIN SODIUM (PORCINE) 1000 UNIT/ML IJ SOLN: 1000 UNIT/ML | INTRAMUSCULAR | Qty: 1

## 2022-10-02 MED FILL — ALBUTEIN 25 % IV SOLN: 25 % | INTRAVENOUS | Qty: 100

## 2022-10-02 MED FILL — PANTOPRAZOLE SODIUM 40 MG IV SOLR: 40 MG | INTRAVENOUS | Qty: 40

## 2022-10-02 MED FILL — SEVELAMER CARBONATE 2.4 G PO PACK: 2.4 g | ORAL | Qty: 2.4

## 2022-10-02 MED FILL — METRONIDAZOLE 500 MG/100ML IV SOLN: 500 MG/100ML | INTRAVENOUS | Qty: 100

## 2022-10-02 MED FILL — HEPARIN SODIUM (PORCINE) 1000 UNIT/ML IJ SOLN: 1000 UNIT/ML | INTRAMUSCULAR | Qty: 2

## 2022-10-02 MED FILL — CEFEPIME HCL 1 G IJ SOLR: 1 g | INTRAMUSCULAR | Qty: 1000

## 2022-10-02 MED FILL — HEPARIN SODIUM (PORCINE) 5000 UNIT/ML IJ SOLN: 5000 UNIT/ML | INTRAMUSCULAR | Qty: 1

## 2022-10-02 MED FILL — NOREPINEPHRINE-SODIUM CHLORIDE 16-0.9 MG/250ML-% IV SOLN: INTRAVENOUS | Qty: 250

## 2022-10-02 MED FILL — DEXTROSE 50 % IV SOLN: 50 % | INTRAVENOUS | Qty: 50

## 2022-10-02 MED FILL — DEXTROSE 10 % IV SOLN: 10 % | INTRAVENOUS | Qty: 1000

## 2022-10-02 MED FILL — VANCOMYCIN HCL 750 MG IV SOLR: 750 MG | INTRAVENOUS | Qty: 750

## 2022-10-02 MED FILL — HEPARIN SODIUM (PORCINE) 1000 UNIT/ML IJ SOLN: 1000 UNIT/ML | INTRAMUSCULAR | Qty: 1.8

## 2022-10-02 MED FILL — DEXTROSE 5 % IV SOLN: 5 % | INTRAVENOUS | Qty: 1000

## 2022-10-02 NOTE — Progress Notes (Signed)
Physical Therapy 10/02/2022       Chart reviewed. Patient currently unavailable 2/2 dialysis. Will defer and continue to follow as appropriate.    Annamary Rummage, PT

## 2022-10-02 NOTE — Other (Signed)
10/02/22 0540   Vital Signs   BP (!) 95/53   Temp 97.7 F (36.5 C)   Pulse 92   Respirations 23   SpO2 96 %   Weight - Scale 82.7 kg (182 lb 5.1 oz)   Weight Method Bed scale   Percent Weight Change 3.06   Pain Assessment   Pain Assessment None - Denies Pain   Treatment   Time On 0545   Treatment Goal 1000   Observations & Evaluations   Level of Consciousness 0   Oriented X 4   Heart Rhythm Regular   Respiratory Quality/Effort Labored   O2 Device Heated high flow cannula   Bilateral Breath Sounds Diminished;Rales   Skin Color Pale   Skin Condition/Temp Dry;Flaky;Fragile   Abdomen Inspection Soft   Edema Generalized   Edema Generalized +1   RUE Edema +1   LUE Edema +1   Technical Checks   Dialysis Machine No. [id number]   RO Machine Number [id number]   Dialyzer Lot No. [id number]   Tubing Lot Number [id number]   All Connections Secure Yes   NS Bag Yes   Saline Line Double Clamped Yes   Dialyzer Revaclear 300   Prime Volume (mL) 200 mL   ICEBOAT I;C;E;B;O;A;T   RO Machine Log Sheet Completed Yes   Machine Alarm Self Test Completed;Passed   Materials engineer Function   Extracorporeal Advertising copywriter Conductivity 13.9   Manual Conductivity 14   Manual Ph 7.4   Bleach Test (Neg) Yes   Bath Temperature 96.8 F (36 C)   Treatment Initiation   Dialyze Hours 3.5   Treatment  Initiation Universal Precautions maintained;Lines secured to patient;Connections secured;Prime given;Venous Parameters set;Arterial Parameters set;Chiropodist engaged   Dialysis Bath   K+ (Potassium) 3   Ca+ (Calcium) 2.5   Na+ (Sodium) 138   HCO3 (Bicarb) 35     Primary RN SBAR: Mina Marble RN  Patient Education: yes, r/t dialysis process  Hepatitis B Surface Ag   Date/Time Value Ref Range Status   09/15/2022 05:05 AM <0.10 Index Final     Hep B S Ab   Date/Time Value Ref Range Status   06/29/2022 09:53 AM <3.10 mIU/mL Final

## 2022-10-02 NOTE — Progress Notes (Signed)
[name] [address]       GI PROGRESS NOTE  Jaime [name], [place]  [phone] office  NP/PA in-hospital M-F until 4:30PM  After 5PM or on weekends, please call operator for physician on call      NAME: Jaime [name]   DOB:  Mar 25, 1946   MRN:  [id number]       Subjective:     Patient resting bed, reports not feeling well. Denies abdominal pain, nausea , or vomiting, Has had issues with swallowing with his oxygen. Nurse reports a need for a chest tube.     Objective:     VITALS:   Last 24hrs VS reviewed since prior progress note. Most recent are:  Vitals:    10/02/22 1200   BP:    Pulse: (!) 114   Resp: (!) 34   Temp:    SpO2:        PHYSICAL EXAM:  General: Cooperative, no acute distress    Neurologic:  Alert and oriented X 3.  HEENT: EOMI, no scleral icterus   Lungs:  CTA bilaterally. No wheezing  Heart:  S1 S2, regular rhythm  Abdomen: Soft, non-distended, + tenderness. +Bowel sounds  Extremities: No edema  Psych:   Good insight. Not anxious or agitated.    Lab Data Reviewed:     Recent Results (from the past 24 hour(s))   POCT Glucose    Collection Time: 10/01/22  1:09 PM   Result Value Ref Range    POC Glucose 76 65 - 117 mg/dL    Performed by: Lenard Simmer REBECCA(RN)    POCT Glucose    Collection Time: 10/01/22  5:31 PM   Result Value Ref Range    POC Glucose 81 65 - 117 mg/dL    Performed by: Rhina Brackett    POCT Glucose    Collection Time: 10/01/22  8:30 PM   Result Value Ref Range    POC Glucose 79 65 - 117 mg/dL    Performed by: Mina Marble M    POCT Glucose    Collection Time: 10/01/22 10:19 PM   Result Value Ref Range    POC Glucose 70 65 - 117 mg/dL    Performed by: Gailen Shelter    Basic Metabolic Panel w/ Reflex to MG    Collection Time: 10/02/22  3:17 AM   Result Value Ref Range    Sodium 136 136 - 145 mmol/L    Potassium 4.1 3.5 - 5.1 mmol/L    Chloride 102 97 - 108 mmol/L    CO2 27 21 - 32 mmol/L    Anion Gap 7 5 - 15 mmol/L    Glucose 98 65 - 100  mg/dL    BUN 19 6 - 20 MG/DL    Creatinine 1.97 (H) 0.70 - 1.30 MG/DL    Bun/Cre Ratio 10 (L) 12 - 20      Est, Glom Filt Rate 35 (L) >60 ml/min/1.20m2    Calcium 8.0 (L) 8.5 - 10.1 MG/DL   Magnesium    Collection Time: 10/02/22  3:17 AM   Result Value Ref Range    Magnesium 2.2 1.6 - 2.4 mg/dL   Phosphorus    Collection Time: 10/02/22  3:17 AM   Result Value Ref Range    Phosphorus 2.6 2.6 - 4.7 MG/DL   CBC with Auto Differential    Collection Time: 10/02/22  3:17 AM   Result Value Ref  Range    WBC 11.8 (H) 4.1 - 11.1 K/uL    RBC 3.02 (L) 4.10 - 5.70 M/uL    Hemoglobin 8.7 (L) 12.1 - 17.0 g/dL    Hematocrit 19.1 (L) 36.6 - 50.3 %    MCV 85.1 80.0 - 99.0 FL    MCH 28.8 26.0 - 34.0 PG    MCHC 33.9 30.0 - 36.5 g/dL    RDW 47.8 (H) 29.5 - 14.5 %    Platelets 106 (L) 150 - 400 K/uL    MPV 10.7 8.9 - 12.9 FL    Nucleated RBCs 0.0 0 PER 100 WBC    nRBC 0.00 0.00 - 0.01 K/uL    Neutrophils % 80 (H) 32 - 75 %    Lymphocytes % 12 12 - 49 %    Monocytes % 8 5 - 13 %    Eosinophils % 0 0 - 7 %    Basophils % 0 0 - 1 %    Immature Granulocytes 0 %    Neutrophils Absolute 9.5 (H) 1.8 - 8.0 K/UL    Lymphocytes Absolute 1.4 0.8 - 3.5 K/UL    Monocytes Absolute 0.9 0.0 - 1.0 K/UL    Eosinophils Absolute 0.0 0.0 - 0.4 K/UL    Basophils Absolute 0.0 0.0 - 0.1 K/UL    Absolute Immature Granulocyte 0.0 K/UL    Differential Type MANUAL      RBC Comment ANISOCYTOSIS  2+        RBC Comment TARGET CELLS  PRESENT           CT ABD/PELVIS 09/04/2022  FINDINGS:      LUNG BASES: Bibasilar atelectasis and small pleural effusions. Focal opacity in  the lingula.  LIVER: The focal low-density collection along the medial surface of the left  hepatic lobe measures 2.8 x 3.8 cm previously 4.4 x 3.1 cm. High density  material along the medial aspect of the collection is unchanged. Several small  adjacent low density structures in the right upper quadrant mesentery without  definite change.  GALLBLADDER: Small amount of sludge/stones.  SPLEEN: No  enlargement or lesion.  PANCREAS: No ill-defined areas of low density in the pancreatic head with mild  surrounding inflammatory stranding.  ADRENALS: No mass.  KIDNEYS: No hydronephrosis. Large left renal cyst.  GI TRACT: Large amount of stool throughout the colon. Rectal wall thickening. No  evidence of bowel obstruction.  Mucosal enhancement of the rectum. No evidence  of active IV contrast extravasation into the bowel lumen.  PERITONEUM: No free air or free fluid.  APPENDIX: Unremarkable.  RETROPERITONEUM: No lymphadenopathy or aortic aneurysm.  ADDITIONAL COMMENTS: N/A.     URINARY BLADDER: Slightly thickened, possibly due to underdistention.  REPRODUCTIVE ORGANS: Unremarkable.  LYMPH NODES: None enlarged.  FREE FLUID: None.  BONES: No destructive bone lesion.  ADDITIONAL COMMENTS: N/A.     IMPRESSION:     1. No CT evidence of acute GI bleed.  2. Rectal wall thickening and mucosal enhancement suggests proctitis. Large  amount stool in the colon with no bowel obstruction.  3. No definite change in left hepatic fluid collection and adjacent ill-defined  stranding.  4. Heterogeneous low density in the pancreatic head and adjacent pancreatic  stranding may be unchanged to prior study performed without IV contrast.  Correlate for clinical signs of acute pancreatitis. Consider follow-up contrast  enhanced pancreas CT to document resolution..  5. Bibasilar atelectasis and pleural effusions     Assessment:  Distal colonic wall thickening: Seen on CT as above, suspicious for proctitis. WBC 12.2. Hgb 8.7., PLT 106.  Can consider colonoscopy once medically stable - no need for urgent colonoscopy. On antibiotics. On PPI. Will monitor.   Anemia: Hgb 8.7, PLT 106.  Perihepatic fluid collection: seen on CT. Consider IR consultation for possible drainage.  Hypotension: on levophed per chart review  Cirrhosis: [name] as above. Seen by hepatology.   ESRD: MWF HD     Patient Active Problem List   Diagnosis    Gastric perforation  (HCC)    Type 2 diabetes mellitus with hyperglycemia, without long-term current use of insulin (HCC)    Intestinal obstruction (HCC)    Severe sepsis (HCC)    Septic shock (HCC)    E coli infection    Liver abscess    Pneumoperitoneum    Acute respiratory failure with hypoxia (HCC)    AKI (acute kidney injury) (HCC)    Multi-organ failure with heart failure (HCC)    Thrombocytopenia (HCC)    Bacteremia due to Escherichia coli    Hepatic abscess    Peripheral arterial disease (HCC)    Hyperbilirubinemia    Gram negative sepsis (HCC)    Septicemia (HCC)    Aspiration pneumonia of both lower lobes (HCC)    Gangrene of toe of both feet (HCC)    Bandemia    Acute pancreatitis    Wound dehiscence    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 Janetta Hora     Signed By: Reymundo Poll, PA     10/02/2022  12:23 PM       Agree with above  Will follow again on Monday

## 2022-10-02 NOTE — Consults (Signed)
Palliative Medicine  Patient Name: Jaime [name]  Date of Birth: 03-Nov-1945  MRN: [id number]  Age: 76 y.o.  Gender: male    Date of Initial Consult: 10/02/22  Date of Service: 10/02/2022  Time: 11:44 AM  Provider: Dedra Skeens, MD  Hospital Day: 3  Admit Date: 09/05/2022  Referring Provider: Dr Mickie Hillier       Reasons for Consultation:  Psychosocial Distress    HISTORY OF PRESENT ILLNESS (HPI):   Jaime [name] "Jaime [name]" is a 76 y.o. male with a past medical history of AKI--> ESRD on HD since  hospital stay 7/30-10/2/23, b/l TMA, hx of shock w/ perforated viscous s/p laparotomy 06/01/22, liver abscess s/p drainage w/ IR, sacral ulcer who was admitted on 09/16/2022 while on his way back to [facility] via EMS w/ hypotension. Recent admission 11/23-11/01/2022- found to have acute proctitis. On IV abx and GI consulted.  Did not have endoscopy due to stable HgB and low platelets. Found to have influenza and PNA , requiring ICU admission and pressor support. Plan is for thoracentesis today.     Our team met pt 06/2022 while at [facility]. At that time pt was started on dialysis, had laparotomy, had VT s/p defibrillation and at one point wife was told he may not survive the night, was minimally responsive. Did make gains.     Psychosocial: Pt from [facility], came to [place] this summer for a [place]asc[place] race. At that time he was doing all ADLs/IADLs and working part time- cleaning two restaurants.  His wife is Jaime [name]. They do not have any children, but are close friends of Jaime [name]- who calls him Dad. Pt has a brother 13. Pt likes Nascar, staying active, and watching movies on the [employer].       PALLIATIVE DIAGNOSES:    Shortness of breath- pleural effusion and PNA  General debility s/p prolonged hospital stay this summer  Goals of care/palliative care encounter     ASSESSMENT AND PLAN:   Pt known to our team from [facility] when pt had prolonged admission 7/30-10/2/23. Goals were clear for full restorative measures as pt was making  gains. He continues to do so, but has had some recent set backs.   Pt resting after dialysis and to have thoracentesis soon- so pay supportive call to wife Jaime [name]. They are 3.5 hours away- she has been able to come to visit ~ 6 times since he has been here. She is hopeful that he will stabilize to go to rehab closer to home, esp given the holidays are coming up- but she will be able to visit again if he has to stay in [place] area.   All his acute medical issues have been hard on them, as pt was so functional beforehand- all things such as dialysis, transmetatarsal amputations, etc just occurred this summer.   While the only family they have is pt's brother Eduard Clos (who has an intellectual disability per wife)- they are also supported by good friends.   Following w/ you. Discussed w/ Emilie CCU RN  Please call with any palliative questions or concerns.  Palliative Care Team is available via perfect serve or via phone.    Referrals to:   _0  Outpatient Palliative Care  _1  Home Based Palliative Care  _2  Home Based Primary Care  _3  Hospice       ADVANCE CARE PLANNING:   _4  The Pall Med Interdisciplinary Team has updated the ACP Navigator with [place] and Patient Capacity  Primary Decision Maker (Active): Jaime [name],Jaime [name] - Spouse - [phone]  Confirm Advance Directive: Yes, not on file    Current Code Status: Full Code     Goals of Care: Goals of Care and Interventions  Patient/Health Care Proxy Stated Goals: Recovery from acute illness       Please refer to Palliative Medicine ACP notes for further details.    PALLIATIVE ASSESSMENT:      Palliative Performance Scale (PPS):  PPS: 50    Pt to have thoracentesis today.     Modified ESAS:  Modified-[place] Symptom Assessment Scale (ESAS)  Tiredness Score: 8  Drowsiness Score: Not drowsy  Pain Score: No pain  Dyspnea Score: 5    Clinical Pain Assessment (nonverbal scale for severity on nonverbal patients):   Clinical Pain Assessment  Severity: 0                 Vital Signs: Blood pressure 98/87, pulse (!) 104, temperature 97.8 F (36.6 C), resp. rate 20, height 1.727 m (5' 7.99"), weight 82.7 kg (182 lb 5.1 oz), SpO2 96 %.    PHYSICAL ASSESSMENT:   General: _0  Oriented x3  _1  Well appearing  _2  Intubated  _3 Ill appearing  _4 Other: calm, resting   Mental Status: _5  Normal mental status exam  _6  Drowsy  _7  Confused  _8 Other:  Cardiovascular: _9  Regular rate/rhythm  _10  Arrhythmia  _11  Other:  Chest: _12  Effort normal  _13 Lungs clear  _14  Respiratory distress  _15 Tachypnea  _16  Other:cough  Abdomen: _17  Soft/non-tender  _18  Normal appearance  _19  Distended  _20  Ascites  _21  Other:  Neurological: _22  Normal speech  _23  Normal sensation  _24 Deficits present:  Extremity: _25  Normal skin color/temp  _26  Clubbing/cyanosis  _27  No edema  _28  Other:b/l TMA    Wt Readings from Last 15 Encounters:   10/02/22 82.7 kg (182 lb 5.1 oz)   10/01/2022 80.6 kg (177 lb 11.1 oz)   07/22/22 91.9 kg (202 lb 9.6 oz)        Current Diet: ADULT DIET; Easy to Chew  ADULT ORAL NUTRITION SUPPLEMENT; Breakfast; Low Calorie/High Protein Oral Supplement  ADULT ORAL NUTRITION SUPPLEMENT; Dinner; Other Oral Supplement; Two Cal HN  ADULT ORAL NUTRITION SUPPLEMENT; Lunch; Frozen Oral Supplement       PSYCHOSOCIAL/SPIRITUAL SCREENING:   Palliative IDT has assessed this patient for cultural preferences / practices and a referral made as appropriate to needs Orthoptist, Patient Advocacy, Ethics, etc.)    Spiritual Affiliation: Unknown    Any spiritual / religious concerns:  _29  Yes /  _30  No   If "Yes" to discuss with pastoral care during IDT     Does caregiver feel burdened by caring for their loved one:   _31  Yes /  _32  No /  _33  No Caregiver Present/Available _34  No Caregiver _35  Pt Lives at Facility  If "Yes" to discuss with social work during IDT    Anticipatory grief assessment:   _36  Normal  / _37  Maladaptive     If "Maladaptive" to discuss with social work during IDT    ESAS Anxiety:      ESAS Depression:           LAB AND IMAGING FINDINGS:   Objective data reviewed:  labs, images, records, medication use, vitals, and chart     FINAL COMMENTS   Thank you for allowing Palliative Medicine to participate in the care of [facility].    Only check if applicable and billing time based rather than MDM  [  x] The total encounter time on this service date was _75 ___ minutes which was spent performing a face-to-face encounter and personally completing the provider-level activities documented in the note. This includes time spent prior to the visit and after the visit in direct care of the patient. This time does not include time spent in any separately reportable services.    Electronically signed by   Dedra Skeens, MD  Palliative Care Team  on 10/02/2022 at 11:44 AM

## 2022-10-02 NOTE — Other (Signed)
10/02/22 0915   Vital Signs   BP 98/87   Temp 97.8 F (36.6 C)   Pulse (!) 104   Respirations 20   SpO2 96 %   Pain Assessment   Pain Assessment None - Denies Pain   Post-Hemodialysis Assessment   Post-Treatment Procedures Blood returned;Catheter capped, clamped and heparinized x 2 ports   Machine Disinfection Process Acid/Vinegar Clean;Heat Disinfect;Exterior Contractor Volume (ml) 300 ml   Blood Volume Processed (Liters) 75 L   Dialyzer Clearance Clear   Duration of Treatment (minutes) 210 minutes   Hemodialysis Intake (ml) 500 ml   Hemodialysis Output (ml) 1500 ml   NET Removed (ml) 1000   Tolerated Treatment Poor   Bilateral Breath Sounds Diminished   Edema Generalized   Edema Generalized +1   RUE Edema +1   LUE Edema +1   Time Off 0915     Primary RN SBAR: Eustaquio Boyden RN  Comments: tolerated tx fair

## 2022-10-02 NOTE — Progress Notes (Signed)
Occupational Therapy    Patient chart reviewed up to date. Patient currently unavailable 2/2 dialysis. Will defer and continue to follow as appropriate.    Thank you.  Isabella Stalling, OTR/L

## 2022-10-02 NOTE — Progress Notes (Signed)
2000 - Report received from [place], [place].

## 2022-10-02 NOTE — Progress Notes (Signed)
10/02/22 1505   Oxygen Therapy/Pulse Ox   O2 Therapy Oxygen humidified   O2 Device Heated high flow cannula   O2 Flow Rate (L/min) (S)  30 L/min  (found on)   FiO2  (S)  89 %  (weaned from 100%)   Pulse (!) 113   Respirations 26   SpO2 98 %

## 2022-10-02 NOTE — Progress Notes (Signed)
[facility]   [address], [address] [place], [place] [address]  Phone: [phone]   [phone]    [email or web address]     Nephrology Progress Note    Patient Name : Jaime [name]     DOB : [birth year]     MRN : [id number]  Date of Admission : 2022-10-09  Date of Servive : 10/02/22    CC:  Follow up for ESRD       Assessment and Plan   ESRD-HD  -Dialyzes MWF at [facility]  -Access: [facility] PC: Placed 07/01/2022  -HD now  - UF as able  - pressors to augment UF  - plan additional UF tomorrow as well    Recent acute proctitis w/ rectal bleed last admission:  - hgb stable    MBD of CKD  - sCa stable will request Phos level  - on Renvela as an outpt    Anemia in CKD:  - ESA MWF    Chronic hypotension:  -Continue midodrine and pressors    Influenza A +:  - on tamiflu    PVD: S/p B/L TMA  DM2  Hx of pancreatitis  Hx of liver abscess s/p drainage  Sacral ulcer     Interval History:  Seen and examined on dialysis.  UF goal 1kg.  On levophed.  On high dose midodrine as well.  Remains on high flow.    Review of Systems: A comprehensive review of systems was negative.    Current Medications:   Current Facility-Administered Medications   Medication Dose Route Frequency    heparin (porcine) 1000 UNIT/ML injection 1,800 Units  1,800 Units IntraCATHeter PRN    And    heparin (porcine) 1000 UNIT/ML injection 1,800 Units  1,800 Units IntraCATHeter PRN    albumin human 25% IV solution 25 g  25 g IntraVENous PRN    magnesium sulfate 2000 mg in 50 mL IVPB premix  2,000 mg IntraVENous PRN    sodium phosphate 15 mmol in sodium chloride 0.9 % 250 mL IVPB  15 mmol IntraVENous PRN    dextrose 50 % IV solution  25 g IntraVENous PRN    midodrine (PROAMATINE) tablet 20 mg  20 mg Oral q8h    insulin lispro (HUMALOG) injection vial 0-4 Units  0-4 Units SubCUTAneous TID WC    insulin lispro (HUMALOG) injection vial 0-4 Units  0-4 Units SubCUTAneous Nightly    glucose chewable tablet 16 g  4 tablet Oral PRN     dextrose bolus 10% 125 mL  125 mL IntraVENous PRN    Or    dextrose bolus 10% 250 mL  250 mL IntraVENous PRN    glucagon injection 1 mg  1 mg SubCUTAneous PRN    dextrose 10 % infusion   IntraVENous Continuous PRN    oseltamivir (TAMIFLU) capsule 30 mg  30 mg Oral Once per day on Mon Wed Fri    heparin (porcine) injection 5,000 Units  5,000 Units SubCUTAneous BID    cefepime (MAXIPIME) 1,000 mg in sodium chloride 0.9 % 50 mL IVPB (mini-bag)  1,000 mg IntraVENous Q24H    Vancomycin - pharmacy to dose   Other RX Placeholder    magnesium sulfate 1000 mg in dextrose 5% 100 mL IVPB  1,000 mg IntraVENous Once    potassium & sodium phosphates (PHOS-NAK) 280-160-250 MG packet 250 mg  1 packet Oral 4x Daily    balsum peru-castor oil (VENELEX) ointment  Topical BID    collagenase ointment   Topical Daily    metronidazole (FLAGYL) 500 mg in 0.9% NaCl 100 mL IVPB premix  500 mg IntraVENous Q8H    ipratropium (ATROVENT) 0.02 % nebulizer solution 0.5 mg  0.5 mg Nebulization TID    dextrose 5 % solution   IntraVENous Continuous    norepinephrine (LEVOPHED) 16 mg in sodium chloride 0.9 % 250 mL infusion  1-100 mcg/min IntraVENous Continuous    sodium chloride flush 0.9 % injection 5-40 mL  5-40 mL IntraVENous 2 times per day    sodium chloride flush 0.9 % injection 5-40 mL  5-40 mL IntraVENous PRN    0.9 % sodium chloride infusion   IntraVENous PRN    albuterol (PROVENTIL) (2.5 MG/3ML) 0.083% nebulizer solution 2.5 mg  2.5 mg Nebulization Q6H PRN    acetaminophen (TYLENOL) tablet 650 mg  650 mg Oral Q6H PRN    Or    acetaminophen (TYLENOL) suppository 650 mg  650 mg Rectal Q6H PRN    polyethylene glycol (GLYCOLAX) packet 17 g  17 g Oral Daily PRN    ondansetron (ZOFRAN) injection 4 mg  4 mg IntraVENous Q6H PRN    pantoprazole (PROTONIX) 40 mg in sodium chloride (PF) 0.9 % 10 mL injection  40 mg IntraVENous Daily    sevelamer (RENVELA) packet 2.4 g  2.4 g Oral TID WC    zinc sulfate (ZINCATE) 220 mg capsule - elemental zinc 50 mg   50 mg Oral Daily    [Held by provider] mirtazapine (REMERON) tablet 7.5 mg  7.5 mg Oral Nightly      Allergies   Allergen Reactions    Augmentin [Amoxicillin-Pot Clavulanate] Hives     Tolerated ceftriaxone 06/2022    Codeine Itching     Unknown reaction    Oxycodone Itching       Objective:  Vitals:    Vitals:    10/02/22 0830 10/02/22 0845 10/02/22 0900 10/02/22 0902   BP: (!) 108/54 98/61 (!) 72/56    Pulse: 95 (!) 102 (!) 107    Resp:    20   Temp:       TempSrc:       SpO2:    94%   Weight:       Height:         Intake and Output:  12/01 0701 - 12/01 1900  In: 190.9 [I.V.:190.9]  Out: -   11/29 1901 - 12/01 0700  In: 25.8 [I.V.:25.8]  Out: -     Physical Examination:  General: Chronically ill appearing, on high flow  Neck:  Supple, no mass  Resp:  Decreased BS no wheezing , normal respiratory effort  CV:  RRR,  no murmur or rub, 1+ b/l LE edema  GI:  Soft, NT, + BS, no HS megaly  Neurologic:  Non focal  Dialysis Access : RIJ PC    [x]     High complexity decision making was performed  []     Patient is at high-risk of decompensation with multiple organ involvement    Lab Data Personally Reviewed: I have reviewed all the pertinent labs, microbiology data and radiology studies during assessment.    Labs:  Recent Labs     09/19/2022  1634 10/01/22  0112 10/02/22  0317   NA 136 138 136   K 3.7 3.8 4.1   CL 103 104 102   CO2 30 28 27    GLUCOSE 78 62* 98   BUN 11  14 19   CREATININE 1.28 1.40* 1.97*   CALCIUM 7.9* 7.8* 8.0*         Recent Labs     09/17/2022  1634 10/01/22  0112 10/02/22  0317   WBC 11.0 12.2* 11.8*   RBC 3.19* 2.98* 3.02*   HGB 9.2* 8.6* 8.7*   HCT 27.0* 25.0* 25.7*   MCV 84.6 83.9 85.1   MCH 28.8 28.9 28.8   MCHC 34.1 34.4 33.9   RDW 18.9* 19.2* 20.0*   PLT 124* 107* 106*   MPV 11.2 10.1 10.7       Recent Labs     09/12/2022  1634   GLOB 2.6       Recent Labs     10/01/22  0112   INR 2.2*        No results for input(s): "CPK", "CKMB", "TROPONINI" in the last 72 hours.    Invalid input(s): "B-NP"  Invalid  input(s): "PHI", "PCO2I", "PO2I", "FIO2I"     Ventilator:       Microbiology:  No results found for: "SDES"  No components found for: "CULT"      I have reviewed the flowsheets.  Chart and Pertinent Notes have been reviewed.   No change in PMH ,family and social history from Consult note.      Sallyanne Havers, MD  [facility]

## 2022-10-02 NOTE — Progress Notes (Signed)
Physical Therapy 10/02/2022         Chart reviewed and eval attempted in pm. Patient has just received thoracentesis and nursing requesting PT to defer.   Will continue to follow    Annamary Rummage, PT

## 2022-10-02 NOTE — Progress Notes (Addendum)
0730: Bedside shift report received from [facility].  1200: IR at bedside, thoracentesis complete. Chest xray ordered.  1326: stat chest xray completed.  1330: chest xray shows a pneumothorax to left lung.  Spoke with IR and intensivist MD. Orders received.  1340: IR at bedside to place chest tube.  1530: Patient complaining of 8/10 pain in sacrum and back. Orders received for dilaudid PRN.  1845: Dr A at bedside, orders received to give d50.  1930: Bedside and Verbal shift change report given to [employer] RN (oncoming nurse) by Starleen Blue RN (offgoing nurse). Report included the following information Nurse Handoff Report, Intake/Output, MAR, Recent Results, and Cardiac Rhythm ST .

## 2022-10-02 NOTE — Progress Notes (Signed)
HISTORY OF PRESENT ILLNESS: 76 y.o. male with PMHx of ESRD on HD (MWF at [place] home), acute proctitis with rectal bleeding requiring admission from [facility] 1 week ago and discharged on 09/02/2022, anemia, DM 2, PVD, liver abscess, pancreatitis, and chronic sacral ulcer who presented to the emergency department from his nursing home for evaluation of hypotension. Patient was being discharged to SNF and on way to facility, per EMS, pressures were 80s over 50s on their arrival. They brought him back to [facility] ED for evaluation of hypotension. Patient's blood pressures were 80s over 50s in the ED. A femoral central line was placed and patient was started on levophed. He is also required supplemental oxygen, satting in the mid 80s on room air and around 90 with nasal cannula.  Now on nonrebreather.  Work-up notable for positive influenza swab and hyperbilirubinemia.  Right upper quadrant ultrasound ordered.     Interval history    11/30 - Still requiring pressors/HFNC, CT chest showed large L effusion, also showed known perihepatic fluid collection    12/1 - Still requiring pressors (requirements more today)/HFNC, L thora today c/b pneumothorax - now s/p CT      Current Facility-Administered Medications:     heparin (porcine) 1000 UNIT/ML injection 1,800 Units, 1,800 Units, IntraCATHeter, PRN, 1,800 Units at 10/02/22 0617 **AND** heparin (porcine) 1000 UNIT/ML injection 1,800 Units, 1,800 Units, IntraCATHeter, PRN, Baltazar Apo, MD, 1,800 Units at 10/02/22 0617    albumin human 25% IV solution 25 g, 25 g, IntraVENous, PRN, Baltazar Apo, MD, Stopped at 10/02/22 0900    norepinephrine (LEVOPHED) 16 mg in sodium chloride 0.9 % 250 mL infusion, 1-100 mcg/min, IntraVENous, Continuous, Lai Hendriks B, MD, Last Rate: 14.1 mL/hr at 10/02/22 1010, 15 mcg/min at 10/02/22 1010    dextrose 10 % infusion, , IntraVENous, Continuous, Saeed Toren, Arita Miss B, MD, Last Rate: 25 mL/hr at 10/02/22  1115, New Bag at 10/02/22 1115    sodium chloride flush 0.9 % injection 5-40 mL, 5-40 mL, IntraVENous, 2 times per day, Emeline Darling B, MD    vancomycin (VANCOCIN) 750 mg in sodium chloride 0.9 % 250 mL IVPB (Vial2Bag), 750 mg, IntraVENous, Once, Emeline Darling B, MD, Last Rate: 250 mL/hr at 10/02/22 1418, 750 mg at 10/02/22 1418    magnesium sulfate 2000 mg in 50 mL IVPB premix, 2,000 mg, IntraVENous, PRN, Robet Leu, MD, Stopped at 10/01/22 1456    sodium phosphate 15 mmol in sodium chloride 0.9 % 250 mL IVPB, 15 mmol, IntraVENous, PRN, Robet Leu, MD, Stopped at 10/01/22 0629    dextrose 50 % IV solution, 25 g, IntraVENous, PRN, Robet Leu, MD, 25 g at 10/01/22 0400    midodrine (PROAMATINE) tablet 20 mg, 20 mg, Oral, q8h, Emeline Darling B, MD, 20 mg at 10/02/22 0519    insulin lispro (HUMALOG) injection vial 0-4 Units, 0-4 Units, SubCUTAneous, TID WC, Carolynn Tuley B, MD    insulin lispro (HUMALOG) injection vial 0-4 Units, 0-4 Units, SubCUTAneous, Nightly, Joni Colegrove B, MD    glucose chewable tablet 16 g, 4 tablet, Oral, PRN, Kenderick Kobler B, MD    dextrose bolus 10% 125 mL, 125 mL, IntraVENous, PRN **OR** dextrose bolus 10% 250 mL, 250 mL, IntraVENous, PRN, Hagan Vanauken B, MD    glucagon injection 1 mg, 1 mg, SubCUTAneous, PRN, Akane Tessier B, MD    dextrose 10 % infusion, , IntraVENous, Continuous PRN, Illana Nolting B, MD    oseltamivir (TAMIFLU) capsule 30 mg, 30 mg,  Oral, Once per day on Mon Wed Fri, Matthew, Dellyn R, NP-C    heparin (porcine) injection 5,000 Units, 5,000 Units, SubCUTAneous, BID, Emeline Darling B, MD, 5,000 Units at 10/02/22 0930    [COMPLETED] ceFEPIme (MAXIPIME) 2,000 mg in sterile water 20 mL IV syringe, 2,000 mg, IntraVENous, Once, 2,000 mg at 10/01/22 1258 **FOLLOWED BY** cefepime (MAXIPIME) 1,000 mg in sodium chloride 0.9 % 50 mL IVPB (mini-bag), 1,000 mg, IntraVENous, Q24H, Lucca Greggs B,  MD, Last Rate: 12.5 mL/hr at 10/02/22 1419, 1,000 mg at 10/02/22 1419    Vancomycin - pharmacy to dose, , Other, RX Placeholder, Emeline Darling B, MD    magnesium sulfate 1000 mg in dextrose 5% 100 mL IVPB, 1,000 mg, IntraVENous, Once, Zaw, Kyaw T, MD    potassium & sodium phosphates (PHOS-NAK) 280-160-250 MG packet 250 mg, 1 packet, Oral, 4x Daily, Zaw, Kyaw T, MD, 250 mg at 10/01/22 2037    balsum peru-castor oil (VENELEX) ointment, , Topical, BID, Zaw, Kyaw T, MD, Given at 10/01/22 2040    collagenase ointment, , Topical, Daily, Zaw, Kyaw T, MD, Given at 10/01/22 1304    metronidazole (FLAGYL) 500 mg in 0.9% NaCl 100 mL IVPB premix, 500 mg, IntraVENous, Q8H, Inella Kuwahara B, MD, Last Rate: 100 mL/hr at 10/02/22 1330, 500 mg at 10/02/22 1330    ipratropium (ATROVENT) 0.02 % nebulizer solution 0.5 mg, 0.5 mg, Nebulization, TID, Shiela Mayer A, MD, 0.5 mg at 10/02/22 0900    sodium chloride flush 0.9 % injection 5-40 mL, 5-40 mL, IntraVENous, 2 times per day, Theodore Demark R, NP-C, 10 mL at 10/02/22 0944    sodium chloride flush 0.9 % injection 5-40 mL, 5-40 mL, IntraVENous, PRN, Rodman Key, Dellyn R, NP-C    0.9 % sodium chloride infusion, , IntraVENous, PRN, Theodore Demark R, NP-C    albuterol (PROVENTIL) (2.5 MG/3ML) 0.083% nebulizer solution 2.5 mg, 2.5 mg, Nebulization, Q6H PRN, Theodore Demark R, NP-C    acetaminophen (TYLENOL) tablet 650 mg, 650 mg, Oral, Q6H PRN **OR** acetaminophen (TYLENOL) suppository 650 mg, 650 mg, Rectal, Q6H PRN, Rodman Key, Dellyn R, NP-C    polyethylene glycol (GLYCOLAX) packet 17 g, 17 g, Oral, Daily PRN, Rodman Key, Dellyn R, NP-C    ondansetron (ZOFRAN) injection 4 mg, 4 mg, IntraVENous, Q6H PRN, Rodman Key, Dellyn R, NP-C    pantoprazole (PROTONIX) 40 mg in sodium chloride (PF) 0.9 % 10 mL injection, 40 mg, IntraVENous, Daily, Theodore Demark R, NP-C, 40 mg at 10/02/22 0940    sevelamer (RENVELA) packet 2.4 g, 2.4 g, Oral, TID WC, Matthew, Dellyn R, NP-C, 2.4 g at  10/01/22 1304    zinc sulfate (ZINCATE) 220 mg capsule - elemental zinc 50 mg, 50 mg, Oral, Daily, Theodore Demark R, NP-C, 50 mg at 10/01/22 0836    [Held by provider] mirtazapine (REMERON) tablet 7.5 mg, 7.5 mg, Oral, Nightly, Matthew, Dellyn R, NP-C    PHYSICAL EXAMINATION:  General - In mild resp distress  Neuro - Generalized weakness  CV - tachy, regular  Lungs - coarse BL  Abd - soft, NT, ND  Ext - warm    VITAL SIGNS:  Vitals:    10/02/22 1505   BP:    Pulse: (!) 113   Resp: 26   Temp:    SpO2: 98%       LABORATORY ANALYSIS:  _0 @  Admission on 09/21/2022   Component Date Value    WBC 09/18/2022 11.0     RBC 09/23/2022 3.19 (L)  Hemoglobin 09/07/2022 9.2 (L)     Hematocrit 09/19/2022 27.0 (L)     MCV 09/14/2022 84.6     MCH 09/06/2022 28.8     MCHC 09/26/2022 34.1     RDW 09/27/2022 18.9 (H)     Platelets 09/04/2022 124 (L)     MPV 09/18/2022 11.2     Nucleated RBCs 09/29/2022 0.0     nRBC 09/23/2022 0.00     Neutrophils % 09/19/2022 76 (H)     Lymphocytes % 09/22/2022 15     Monocytes % 09/25/2022 7     Eosinophils % 09/06/2022 0     Basophils % 09/03/2022 0     Immature Granulocytes 09/29/2022 1 (H)     Neutrophils Absolute 09/21/2022 8.3 (H)     Lymphocytes Absolute 09/27/2022 1.7     Monocytes Absolute 09/06/2022 0.8     Eosinophils Absolute 09/07/2022 0.0     Basophils Absolute 09/17/2022 0.0     Absolute Immature Granul* 09/10/2022 0.1 (H)     Differential Type 09/12/2022 AUTOMATED     Sodium 10/01/2022 136     Potassium 09/23/2022 3.7     Chloride 09/23/2022 103     CO2 09/18/2022 30     Anion Gap 09/22/2022 3 (L)     Glucose 09/12/2022 78     BUN 09/02/2022 11     Creatinine 09/16/2022 1.28     Bun/Cre Ratio 09/10/2022 9 (L)     Est, Glom Filt Rate 09/27/2022 58 (L)     Calcium 09/26/2022 7.9 (L)     Total Bilirubin 10/01/2022 3.2 (H)     ALT 09/27/2022 14     AST 09/05/2022 27     Alk Phosphatase 09/15/2022 130 (H)     Total Protein 09/28/2022 5.3 (L)     Albumin 09/05/2022 2.7 (L)      Globulin 09/23/2022 2.6     Albumin/Globulin Ratio 09/12/2022 1.0 (L)     Ventricular Rate 09/28/2022 105     Atrial Rate 09/15/2022 105     P-R Interval 09/05/2022 158     QRS Duration 09/23/2022 72     Q-T Interval 09/08/2022 366     QTc Calculation (Bazett) 09/07/2022 483     P Axis 09/18/2022 30     R Axis 09/08/2022 0     T Axis 09/25/2022 44     Diagnosis 09/12/2022                      Value:Sinus tachycardia  Nonspecific T wave abnormality  Abnormal ECG  When compared with ECG of 26-Sep-2022 09:12,  Vent. rate has increased BY  36 BPM  Confirmed by Cleophas Dunker [phone]) on 09/29/2022 4:35:10 PM      Specimen HOld 09/21/2022 1red 1blue     Comment: 09/16/2022 Add-on orders for these samples will be processed based on acceptable specimen integrity and analyte stability, which may vary by analyte.     Lactic Acid, Plasma 09/11/2022 3.1 (HH)     Special Requests 09/29/2022                      Value:NO SPECIAL REQUESTS  MEDIAL PORT      Culture 09/29/2022 NO GROWTH 1 DAY     Special Requests 09/16/2022                      Value:NO SPECIAL REQUESTS  MEDIAL PORT  Culture 09/16/2022 NO GROWTH 1 DAY     Source 09/29/2022 Nasopharyngeal     SARS-CoV-2, Rapid 09/25/2022 Not detected     Influenza A Ag 09/04/2022 Positive (A)     Influenza B Ag 09/29/2022 Negative     Lactic Acid, Plasma 10/01/2022 3.1 (HH)     Sodium 10/01/2022 138     Potassium 10/01/2022 3.8     Chloride 10/01/2022 104     CO2 10/01/2022 28     Anion Gap 10/01/2022 6     Glucose 10/01/2022 62 (L)     BUN 10/01/2022 14     Creatinine 10/01/2022 1.40 (H)     Bun/Cre Ratio 10/01/2022 10 (L)     Est, Glom Filt Rate 10/01/2022 52 (L)     Calcium 10/01/2022 7.8 (L)     Magnesium 10/01/2022 1.5 (L)     Phosphorus 10/01/2022 0.8 (LL)     WBC 10/01/2022 12.2 (H)     RBC 10/01/2022 2.98 (L)     Hemoglobin 10/01/2022 8.6 (L)     Hematocrit 10/01/2022 25.0 (L)     MCV 10/01/2022 83.9     MCH 10/01/2022 28.9     MCHC 10/01/2022 34.4     RDW 10/01/2022 19.2  (H)     Platelets 10/01/2022 107 (L)     MPV 10/01/2022 10.1     Nucleated RBCs 10/01/2022 0.0     nRBC 10/01/2022 0.00     Neutrophils % 10/01/2022 75     Band Neutrophils 10/01/2022 2     Lymphocytes % 10/01/2022 15     Monocytes % 10/01/2022 8     Eosinophils % 10/01/2022 0     Basophils % 10/01/2022 0     Immature Granulocytes 10/01/2022 0     Neutrophils Absolute 10/01/2022 9.4 (H)     Lymphocytes Absolute 10/01/2022 1.8     Monocytes Absolute 10/01/2022 1.0     Eosinophils Absolute 10/01/2022 0.0     Basophils Absolute 10/01/2022 0.0     Absolute Immature Granul* 10/01/2022 0.0     Differential Type 10/01/2022 MANUAL     RBC Comment 10/01/2022                      Value:ANISOCYTOSIS  1+      RBC Comment 10/01/2022                      Value:TARGET CELLS  1+      INR 10/01/2022 2.2 (H)     Protime 10/01/2022 21.9 (H)     Lactic Acid, Plasma 10/01/2022 1.9     POC Glucose 10/01/2022 126 (H)     Performed by: 10/01/2022 Fleeta Emmer Erin     POC Glucose 10/01/2022 96     Performed by: 10/01/2022 VASSELL Norris     POC Glucose 10/01/2022 76     Performed by: 10/01/2022 YAUCHZY REBECCA(RN)     POC Glucose 10/01/2022 81     Performed by: 10/01/2022 VASSELL Norris     Sodium 10/02/2022 136     Potassium 10/02/2022 4.1     Chloride 10/02/2022 102     CO2 10/02/2022 27     Anion Gap 10/02/2022 7     Glucose 10/02/2022 98     BUN 10/02/2022 19     Creatinine 10/02/2022 1.97 (H)     Bun/Cre Ratio 10/02/2022 10 (L)     Est, Glom Filt Rate 10/02/2022 35 (L)  Calcium 10/02/2022 8.0 (L)     Magnesium 10/02/2022 2.2     Phosphorus 10/02/2022 2.6     WBC 10/02/2022 11.8 (H)     RBC 10/02/2022 3.02 (L)     Hemoglobin 10/02/2022 8.7 (L)     Hematocrit 10/02/2022 25.7 (L)     MCV 10/02/2022 85.1     MCH 10/02/2022 28.8     MCHC 10/02/2022 33.9     RDW 10/02/2022 20.0 (H)     Platelets 10/02/2022 106 (L)     MPV 10/02/2022 10.7     Nucleated RBCs 10/02/2022 0.0     nRBC 10/02/2022 0.00     Neutrophils % 10/02/2022 80 (H)      Lymphocytes % 10/02/2022 12     Monocytes % 10/02/2022 8     Eosinophils % 10/02/2022 0     Basophils % 10/02/2022 0     Immature Granulocytes 10/02/2022 0     Neutrophils Absolute 10/02/2022 9.5 (H)     Lymphocytes Absolute 10/02/2022 1.4     Monocytes Absolute 10/02/2022 0.9     Eosinophils Absolute 10/02/2022 0.0     Basophils Absolute 10/02/2022 0.0     Absolute Immature Granul* 10/02/2022 0.0     Differential Type 10/02/2022 MANUAL     RBC Comment 10/02/2022                      Value:ANISOCYTOSIS  2+      RBC Comment 10/02/2022                      Value:TARGET CELLS  PRESENT      POC Glucose 10/01/2022 79     Performed by: 10/01/2022 DODSON Manon M     POC Glucose 10/01/2022 70     Performed by: 10/01/2022 DODSON Manon M     Specimen 10/02/2022 PLEURAL FLUID     Color, Fluid 10/02/2022 PINK     Appearance, Fluid 10/02/2022 CLOUDY     RBC, Fluid 10/02/2022 >100 (H)     Total Nucleated Cell Cou* 10/02/2022 12,103     Fluid Type 10/02/2022 PLEURAL FLUID     Glucose, Body Fluid 10/02/2022 73     Fluid Type 10/02/2022 PLEURAL FLUID     LD, Fluid 10/02/2022 539     Fluid Type 10/02/2022 PLEURAL FLUID     pH, Fluid 10/02/2022 7.2     Fluid Type 10/02/2022 PLEURAL FLUID     Total Protein, Body Fluid 10/02/2022 2.6      Encounter Date: 09/16/2022   EKG 12 Lead   Result Value    Ventricular Rate 105    Atrial Rate 105    P-R Interval 158    QRS Duration 72    Q-T Interval 366    QTc Calculation (Bazett) 483    P Axis 30    R Axis 0    T Axis 44    Diagnosis      Sinus tachycardia  Nonspecific T wave abnormality  Abnormal ECG  When compared with ECG of 26-Sep-2022 09:12,  Vent. rate has increased BY  36 BPM  Confirmed by Cleophas Dunker [phone]) on 09/06/2022 4:35:10 PM         RADIOGRAPHIC STUDIES:  XR CHEST PORTABLE  Narrative: INDICATION:  s/p left chest tube placement     Exam: Portable chest 1408.     Comparison: Earlier same day.    Findings: A left-sided pigtail pleural catheter has been  placed. The  left  pneumothorax has decreased in size but remains moderate in size. There is  mediastinal shift to the right but in part this is accentuated by rotation of  the film. This is likely unchanged. There is partial collapse of the left lung.  Patchy right perihilar airspace disease. Right-sided port unchanged and no  right-sided pneumothorax.  Impression: 1. Decreased left-sided pneumothorax post chest tube placement  XR CHEST PORTABLE  Narrative: EXAM: XR CHEST PORTABLE    INDICATION: s/p thoracentesis left    COMPARISON: 09/29/2022    TECHNIQUE: AP portable semierect view of the chest.     FINDINGS:  Right sided central venous catheter is again noted.  There is a new moderate-sized left-sided pneumothorax.  There is up to 4.1 cm of  pleural separation at the apex and 4.8 cm of separation at the base.  There are small bilateral pleural effusions, left greater than right.  There is  vascular congestion.  The cardiomediastinal configuration is within normal limits.  No acute bony abnormalities.  Impression: Moderate-sized left-sided pneumothorax.  The clinical team is aware.      ASSESSMENT:  76 y.o. male with PMHx of ESRD on HD (MWF at [place] home), chronic hypotension, acute proctitis with rectal bleeding, anemia, DM 2, PVD, perforated viscus s/p ex lap c/b liver abscess, pancreatitis, and chronic sacral ulcer being managed for septic shock, influenza +Ve, acute hypoxic resp failure      IMPRESSION AND PLAN:    Neuro - Delirium prevention strategies, pain control as needed    CV - Hypotension/shock, hx of chronic hypotension  Cont HD monitoring, MAP goal > 60 for now, on levo - wean as tolerated, cont midodrine, avoid stress dose steroids for now (Flu +ve), recent echo sig for DD    Pulm - Hypoxic resp failure, BL effusions, BL LL atelectasis, multifocal PNA, flu +ve, L pneumothorax  Supplemental o2 as needed, sat goal > 92%, on HFNC, at risk of needing to be intubated, L thora performed by IR c/b  pneumothorax - now s/p L chest tube - to suction, serial CXRs, avoid positive pressure if feasible, ensure chest PT, cont Tamiflu, cont Bsabx for now    GI - Cirrhosis, Chronic Perihepatic fluid collection  Diet as tolerated for now, serial abd exams, IR consulted for possible fluid collection drainage    Renal - ESRD on HD  Dose meds renally, correct electrolyte derangements as needed, HD per renal    Heme - heparin for DVT ppx    ID - Presumed sepsis unclear source, slight leukocytosis, flu +ve, chronic perihepatic collection  Cont BSabx for now, f/u micro studies, cont Tamiflu, IR consulted for possible perihepatic fluid collection drainage    Endo - Keep glu 100 - 180; currently requiring dextrose containing infusion to maintain glu level     Pt with vital organ dysfunction  High risk of complications, morbidity, mortality  Prognosis very guarded, palliative care team consulted  CCT 40 mins

## 2022-10-02 DEATH — deceased

## 2022-10-03 ENCOUNTER — Inpatient Hospital Stay: Admit: 2022-10-03 | Payer: MEDICARE

## 2022-10-03 ENCOUNTER — Inpatient Hospital Stay: Admit: 2022-10-04 | Payer: MEDICARE

## 2022-10-03 LAB — POCT GLUCOSE
POC Glucose: 134 mg/dL — ABNORMAL HIGH (ref 65–117)
POC Glucose: 115 mg/dL (ref 65–117)
POC Glucose: 71 mg/dL (ref 65–100)

## 2022-10-03 LAB — CBC WITH AUTO DIFFERENTIAL
Absolute Immature Granulocyte: 0.2 10*3/uL — ABNORMAL HIGH (ref 0.00–0.04)
Basophils %: 0 % (ref 0–1)
Basophils %: 0 % (ref 0–1)
Basophils Absolute: 0 10*3/uL (ref 0.0–0.1)
Basophils Absolute: 0 10*3/uL (ref 0.0–0.1)
Eosinophils %: 0 % (ref 0–7)
Eosinophils %: 0 % (ref 0–7)
Eosinophils Absolute: 0 10*3/uL (ref 0.0–0.4)
Eosinophils Absolute: 0 10*3/uL (ref 0.0–0.4)
Hematocrit: 26.8 % — ABNORMAL LOW (ref 36.6–50.3)
Hematocrit: 27.1 % — ABNORMAL LOW (ref 36.6–50.3)
Hemoglobin: 8.5 g/dL — ABNORMAL LOW (ref 12.1–17.0)
Hemoglobin: 7.9 g/dL — ABNORMAL LOW (ref 12.1–17.0)
Immature Granulocytes %: 1 % — ABNORMAL HIGH (ref 0.0–0.5)
Immature Granulocytes Absolute: 0.2 10*3/uL — ABNORMAL HIGH (ref 0.00–0.04)
Immature Granulocytes: 1 % — ABNORMAL HIGH (ref 0.0–0.5)
Lymphocytes %: 13 % (ref 12–49)
Lymphocytes %: 8 % — ABNORMAL LOW (ref 12–49)
Lymphocytes Absolute: 2.2 10*3/uL (ref 0.8–3.5)
Lymphocytes Absolute: 1.3 10*3/uL (ref 0.8–3.5)
MCH: 29.3 PG (ref 26.0–34.0)
MCH: 29.4 PG (ref 26.0–34.0)
MCHC: 31.7 g/dL (ref 30.0–36.5)
MCHC: 29.2 g/dL — ABNORMAL LOW (ref 30.0–36.5)
MCV: 92.4 FL (ref 80.0–99.0)
MCV: 100.7 FL — ABNORMAL HIGH (ref 80.0–99.0)
MPV: 11.3 FL (ref 8.9–12.9)
MPV: 11.9 FL (ref 8.9–12.9)
Monocytes %: 6 % (ref 5–13)
Monocytes %: 5 % (ref 5–13)
Monocytes Absolute: 1 10*3/uL (ref 0.0–1.0)
Monocytes Absolute: 0.8 10*3/uL (ref 0.0–1.0)
Neutrophils %: 80 % — ABNORMAL HIGH (ref 32–75)
Neutrophils %: 86 % — ABNORMAL HIGH (ref 32–75)
Neutrophils Absolute: 13.8 10*3/uL — ABNORMAL HIGH (ref 1.8–8.0)
Neutrophils Absolute: 13.9 10*3/uL — ABNORMAL HIGH (ref 1.8–8.0)
Nucleated RBCs: 0 PER 100 WBC
Nucleated RBCs: 0.1 PER 100 WBC — ABNORMAL HIGH
Platelets: 112 10*3/uL — ABNORMAL LOW (ref 150–400)
Platelets: 75 10*3/uL — ABNORMAL LOW (ref 150–400)
RBC: 2.9 M/uL — ABNORMAL LOW (ref 4.10–5.70)
RBC: 2.69 M/uL — ABNORMAL LOW (ref 4.10–5.70)
RDW: 19.9 % — ABNORMAL HIGH (ref 11.5–14.5)
RDW: 19.2 % — ABNORMAL HIGH (ref 11.5–14.5)
WBC: 17.2 10*3/uL — ABNORMAL HIGH (ref 4.1–11.1)
WBC: 16.2 10*3/uL — ABNORMAL HIGH (ref 4.1–11.1)
nRBC: 0 10*3/uL (ref 0.00–0.01)
nRBC: 0.02 10*3/uL — ABNORMAL HIGH (ref 0.00–0.01)

## 2022-10-03 LAB — PROTIME-INR
INR: 3.5 — ABNORMAL HIGH (ref 0.9–1.1)
Protime: 34.2 s — ABNORMAL HIGH (ref 9.0–11.1)

## 2022-10-03 LAB — VENOUS BLOOD GAS, POINT OF CARE
BASE EXCESS, VENOUS (POC): 0.5 mmol/L
Base Deficit, Venous: 8 mmol/L
FIO2: 100 %
HCO3, Venous: 24.2 MMOL/L (ref 23.0–28.0)
HCO3, Venous: 18.7 MMOL/L — ABNORMAL LOW (ref 23.0–28.0)
PCO2, Venus, POC: 34.4 MMHG — ABNORMAL LOW (ref 41–51)
PCO2, Venus, POC: 42 MMHG (ref 41–51)
PH, VENOUS (POC): 7.46 — ABNORMAL HIGH (ref 7.32–7.42)
PH, VENOUS (POC): 7.26 — ABNORMAL LOW (ref 7.32–7.42)
PO2, VENOUS (POC): 40 mmHg (ref 25–40)
PO2, VENOUS (POC): 30 mmHg (ref 25–40)
SO2, VENOUS (POC): 77.9 % (ref 65–88)
SO2, VENOUS (POC): 47.9 % — ABNORMAL LOW (ref 65–88)

## 2022-10-03 LAB — COMPREHENSIVE METABOLIC PANEL
ALT: 20 U/L (ref 12–78)
AST: 60 U/L — ABNORMAL HIGH (ref 15–37)
Albumin/Globulin Ratio: 0.9 — ABNORMAL LOW (ref 1.1–2.2)
Albumin: 2.4 g/dL — ABNORMAL LOW (ref 3.5–5.0)
Alk Phosphatase: 131 U/L — ABNORMAL HIGH (ref 45–117)
Anion Gap: 18 mmol/L — ABNORMAL HIGH (ref 5–15)
BUN: 12 MG/DL (ref 6–20)
Bun/Cre Ratio: 6 — ABNORMAL LOW (ref 12–20)
CO2: 15 mmol/L — CL (ref 21–32)
Calcium: 7.6 MG/DL — ABNORMAL LOW (ref 8.5–10.1)
Chloride: 104 mmol/L (ref 97–108)
Creatinine: 1.91 MG/DL — ABNORMAL HIGH (ref 0.70–1.30)
Est, Glom Filt Rate: 36 mL/min/{1.73_m2} — ABNORMAL LOW (ref >60)
Globulin: 2.6 g/dL (ref 2.0–4.0)
Glucose: 99 mg/dL (ref 65–100)
Potassium: 4.2 mmol/L (ref 3.5–5.1)
Sodium: 137 mmol/L (ref 136–145)
Total Bilirubin: 3.8 MG/DL — ABNORMAL HIGH (ref 0.2–1.0)
Total Protein: 5 g/dL — ABNORMAL LOW (ref 6.4–8.2)

## 2022-10-03 LAB — POCT BLOOD GAS & ELECTROLYTES
Anion Gap, POC: 23 — ABNORMAL HIGH (ref 10–20)
Base Deficit (POC): 8.5 mmol/L
HCO3, Arterial: 15 mmol/L
POC Chloride: 106 MMOL/L (ref 100–108)
POC Creatinine: 1.2 MG/DL (ref 0.6–1.3)
POC Glucose: 77 MG/DL (ref 74–106)
POC Ionized Calcium: 0.95 mmol/L — ABNORMAL LOW (ref 1.12–1.32)
POC Lactic Acid: 15.46 mmol/L (ref 0.40–2.00)
POC O2 SAT: 100 %
POC PO2: 401 MMHG — ABNORMAL HIGH (ref 80–100)
POC Potassium: 3.6 MMOL/L (ref 3.5–5.5)
POC Sodium: 144 MMOL/L (ref 136–145)
POC TCO2: 15 MMOL/L — ABNORMAL LOW (ref 19–24)
POC pCO2: 24.3 MMHG — ABNORMAL LOW (ref 35.0–45.0)
POC pH: 7.41 (ref 7.35–7.45)
eGFR, POC: >60 mL/min/{1.73_m2} (ref >60)

## 2022-10-03 LAB — BASIC METABOLIC PANEL W/ REFLEX TO MG FOR LOW K
Anion Gap: 9 mmol/L (ref 5–15)
BUN/Creatinine Ratio: 6 — ABNORMAL LOW (ref 12–20)
BUN: 10 MG/DL (ref 6–20)
CO2: 25 mmol/L (ref 21–32)
Calcium: 7.8 MG/DL — ABNORMAL LOW (ref 8.5–10.1)
Chloride: 103 mmol/L (ref 97–108)
Creatinine: 1.68 MG/DL — ABNORMAL HIGH (ref 0.70–1.30)
Est, Glom Filt Rate: 42 mL/min/{1.73_m2} — ABNORMAL LOW (ref >60)
Glucose: 150 mg/dL — ABNORMAL HIGH (ref 65–100)
Potassium: 4.1 mmol/L (ref 3.5–5.1)
Sodium: 137 mmol/L (ref 136–145)

## 2022-10-03 LAB — PHOSPHORUS
Phosphorus: 1.9 MG/DL — ABNORMAL LOW (ref 2.6–4.7)
Phosphorus: 3.8 MG/DL (ref 2.6–4.7)

## 2022-10-03 LAB — MAGNESIUM
Magnesium: 2.2 mg/dL (ref 1.6–2.4)
Magnesium: 2.3 mg/dL (ref 1.6–2.4)

## 2022-10-03 MED ORDER — FENTANYL CITRATE (PF) 250 MCG/5ML IJ SOLN
250 MCG/5ML | INTRAMUSCULAR | Status: DC | PRN
Start: 2022-10-03 — End: 2022-10-04
  Administered 2022-10-03 (×2): 50 ug via INTRAVENOUS

## 2022-10-03 MED ORDER — FENTANYL CITRATE 1000 MCG/100ML IV SOLN
1000 MCG/100ML | INTRAVENOUS | Status: DC
Start: 2022-10-03 — End: 2022-10-04
  Administered 2022-10-03: 23:00:00 25 ug/h via INTRAVENOUS

## 2022-10-03 MED ORDER — ALBUMIN HUMAN 5 % IV SOLN
5 % | Freq: Once | INTRAVENOUS | Status: AC
Start: 2022-10-03 — End: 2022-10-03
  Administered 2022-10-03: 20:00:00 25 g via INTRAVENOUS

## 2022-10-03 MED ORDER — AMIODARONE HCL 150 MG/3ML IV SOLN
150 MG/3ML | Freq: Once | INTRAVENOUS | Status: AC
Start: 2022-10-03 — End: 2022-10-03
  Administered 2022-10-03: 13:00:00 150 mg via INTRAVENOUS

## 2022-10-03 MED ORDER — ETOMIDATE 2 MG/ML IV SOLN
2 MG/ML | INTRAVENOUS | Status: AC
Start: 2022-10-03 — End: 2022-10-03
  Administered 2022-10-03: 17:00:00 10 via INTRAVENOUS

## 2022-10-03 MED ORDER — DEXTROSE 5 % IV SOLN
5 % | INTRAVENOUS | Status: DC
Start: 2022-10-03 — End: 2022-10-04
  Administered 2022-10-04: 0.5 mg/min via INTRAVENOUS

## 2022-10-03 MED ORDER — SODIUM CHLORIDE 0.9 % IV SOLN
0.9 % | INTRAVENOUS | Status: DC
Start: 2022-10-03 — End: 2022-10-04
  Administered 2022-10-04: 05:00:00 0.04 [IU]/min via INTRAVENOUS

## 2022-10-03 MED ORDER — ETOMIDATE 2 MG/ML IV SOLN
2 MG/ML | Freq: Once | INTRAVENOUS | Status: AC
Start: 2022-10-03 — End: 2022-10-03

## 2022-10-03 MED ORDER — SODIUM BICARBONATE 8.4 % IV SOLN
8.4 % | Freq: Once | INTRAVENOUS | Status: AC
Start: 2022-10-03 — End: 2022-10-03

## 2022-10-03 MED ORDER — SODIUM BICARBONATE 8.4 % IV SOLN
8.4 % | INTRAVENOUS | Status: AC
Start: 2022-10-03 — End: 2022-10-03
  Administered 2022-10-03: 21:00:00 100 via INTRAVENOUS

## 2022-10-03 MED ORDER — PRISMASOL BGK 4/2.5 32-4-2.5 MEQ/L EC SOLN
Status: DC
Start: 2022-10-03 — End: 2022-10-04
  Administered 2022-10-03 (×3): via INTRAVENOUS_CENTRAL

## 2022-10-03 MED ORDER — MIDAZOLAM HCL 2 MG/2ML IJ SOLN
2 MG/ML | INTRAMUSCULAR | Status: AC
Start: 2022-10-03 — End: 2022-10-03
  Administered 2022-10-03: 17:00:00 2 via INTRAVENOUS

## 2022-10-03 MED ORDER — NOREPINEPHRINE-SODIUM CHLORIDE 16-0.9 MG/250ML-% IV SOLN
INTRAVENOUS | Status: AC
Start: 2022-10-03 — End: 2022-10-04

## 2022-10-03 MED ORDER — DEXTROSE 5 % IV SOLN
5 % | INTRAVENOUS | Status: DC
Start: 2022-10-03 — End: 2022-10-04
  Administered 2022-10-03 – 2022-10-04 (×2): via INTRAVENOUS

## 2022-10-03 MED ORDER — FENTANYL CITRATE (PF) 250 MCG/5ML IJ SOLN
250 MCG/5ML | Freq: Once | INTRAMUSCULAR | Status: AC
Start: 2022-10-03 — End: 2022-10-03
  Administered 2022-10-03: 12:00:00 50 ug via INTRAVENOUS

## 2022-10-03 MED ORDER — PHENYLEPHRINE 100 MG/250 ML IN NS PREMIX INFUSION
100 MG/250ML | INTRAVENOUS | Status: DC
Start: 2022-10-03 — End: 2022-10-03

## 2022-10-03 MED ORDER — PHENYLEPHRINE HCL 10 MG/ML SOLN (MIXTURES ONLY)
10 MG/ML | INTRAVENOUS | Status: DC
Start: 2022-10-03 — End: 2022-10-04
  Administered 2022-10-03 – 2022-10-04 (×3): 300 ug/min via INTRAVENOUS

## 2022-10-03 MED ORDER — ALBUMIN HUMAN 5 % IV SOLN
5 % | Freq: Once | INTRAVENOUS | Status: AC
Start: 2022-10-03 — End: 2022-10-03
  Administered 2022-10-03: 23:00:00 25 g via INTRAVENOUS

## 2022-10-03 MED ORDER — VIAL2BAG ADAPTOR (20 MM)
0.9 % | INTRAVENOUS | Status: DC
Start: 2022-10-03 — End: 2022-10-03
  Administered 2022-10-03: 17:00:00 30 ug/min via INTRAVENOUS
  Administered 2022-10-03: 21:00:00 300 ug/min via INTRAVENOUS

## 2022-10-03 MED ORDER — VASOPRESSIN 20 UNIT/ML SOLN (MIXTURES ONLY)
20 UNIT/ML | INTRAVENOUS | Status: DC
Start: 2022-10-03 — End: 2022-10-03
  Administered 2022-10-03: 13:00:00 0.03 [IU]/min via INTRAVENOUS
  Administered 2022-10-03: 22:00:00 0.04 [IU]/min via INTRAVENOUS

## 2022-10-03 MED ORDER — STERILE WATER FOR INJECTION (MIXTURES ONLY)
100 MG | Freq: Three times a day (TID) | INTRAMUSCULAR | Status: DC
Start: 2022-10-03 — End: 2022-10-04
  Administered 2022-10-03 – 2022-10-04 (×3): 100 mg via INTRAVENOUS

## 2022-10-03 MED ORDER — MIDAZOLAM HCL (PF) 2 MG/2ML IJ SOLN
2 MG/ML | Freq: Once | INTRAMUSCULAR | Status: AC
Start: 2022-10-03 — End: 2022-10-03

## 2022-10-03 MED ORDER — SODIUM CHLORIDE 0.9 % IV SOLN
0.9 | INTRAVENOUS | Status: DC
Start: 2022-10-03 — End: 2022-10-04
  Administered 2022-10-03 – 2022-10-04 (×3): 100 ug/min via INTRAVENOUS

## 2022-10-03 MED ORDER — DEXTROSE 5 % IV SOLN
5 % | Freq: Once | INTRAVENOUS | Status: AC
Start: 2022-10-03 — End: 2022-10-03
  Administered 2022-10-03: 150 mg via INTRAVENOUS

## 2022-10-03 MED ORDER — DEXMEDETOMIDINE HCL IN NACL 400 MCG/100ML IV SOLN
400 MCG/100ML | INTRAVENOUS | Status: DC
Start: 2022-10-03 — End: 2022-10-04
  Administered 2022-10-03: 05:00:00 0.4 ug/kg/h via INTRAVENOUS
  Administered 2022-10-03 – 2022-10-04 (×3): 0.6 ug/kg/h via INTRAVENOUS

## 2022-10-03 MED FILL — SEVELAMER CARBONATE 2.4 G PO PACK: 2.4 g | ORAL | Qty: 2.4

## 2022-10-03 MED FILL — ACETAMINOPHEN 650 MG RE SUPP: 650 MG | RECTAL | Qty: 1

## 2022-10-03 MED FILL — FENTANYL CITRATE (PF) 250 MCG/5ML IJ SOLN: 250 MCG/5ML | INTRAMUSCULAR | Qty: 5

## 2022-10-03 MED FILL — VASOPRESSIN 20 UNIT/ML IV SOLN: 20 UNIT/ML | INTRAVENOUS | Qty: 2

## 2022-10-03 MED FILL — ALBUTEIN 5 % IV SOLN: 5 % | INTRAVENOUS | Qty: 500

## 2022-10-03 MED FILL — AMIODARONE HCL 450 MG/9ML IV SOLN: 450 MG/9ML | INTRAVENOUS | Qty: 9

## 2022-10-03 MED FILL — PRISMASOL BGK 4/2.5 32-4-2.5 MEQ/L EC SOLN: Qty: 5000

## 2022-10-03 MED FILL — METRONIDAZOLE 500 MG/100ML IV SOLN: 500 MG/100ML | INTRAVENOUS | Qty: 100

## 2022-10-03 MED FILL — SODIUM BICARBONATE 8.4 % IV SOLN: 8.4 % | INTRAVENOUS | Qty: 150

## 2022-10-03 MED FILL — DEXMEDETOMIDINE HCL IN NACL 400 MCG/100ML IV SOLN: 400 MCG/100ML | INTRAVENOUS | Qty: 100

## 2022-10-03 MED FILL — AMIODARONE HCL 150 MG/3ML IV SOLN: 150 MG/3ML | INTRAVENOUS | Qty: 3

## 2022-10-03 MED FILL — HEPARIN SODIUM (PORCINE) 5000 UNIT/ML IJ SOLN: 5000 UNIT/ML | INTRAMUSCULAR | Qty: 1

## 2022-10-03 MED FILL — PHOS-NAK 280-160-250 MG PO PACK: 280-160-250 MG | ORAL | Qty: 1

## 2022-10-03 MED FILL — SODIUM CHLORIDE 0.9 % IV SOLN: 0.9 % | INTRAVENOUS | Qty: 250

## 2022-10-03 MED FILL — SODIUM PHOSPHATES 45 MMOLE/15ML IV SOLN: 45 MMOLE/15ML | INTRAVENOUS | Qty: 5

## 2022-10-03 MED FILL — IPRATROPIUM BROMIDE 0.02 % IN SOLN: 0.02 % | RESPIRATORY_TRACT | Qty: 2.5

## 2022-10-03 MED FILL — MIDAZOLAM HCL 2 MG/2ML IJ SOLN: 2 MG/ML | INTRAMUSCULAR | Qty: 2

## 2022-10-03 MED FILL — VASOPRESSIN 20 UNIT/ML IV SOLN: 20 UNIT/ML | INTRAVENOUS | Qty: 1

## 2022-10-03 MED FILL — PHENYLEPHRINE HCL (PRESSORS) 10 MG/ML IV SOLN: 10 MG/ML | INTRAVENOUS | Qty: 10

## 2022-10-03 MED FILL — FENTANYL CITRATE 1000 MCG/100ML IV SOLN: 1000 MCG/100ML | INTRAVENOUS | Qty: 100

## 2022-10-03 MED FILL — PANTOPRAZOLE SODIUM 40 MG IV SOLR: 40 MG | INTRAVENOUS | Qty: 40

## 2022-10-03 MED FILL — VASOSTRICT 20 UNIT/ML IV SOLN: 20 UNIT/ML | INTRAVENOUS | Qty: 1

## 2022-10-03 MED FILL — NOREPINEPHRINE-SODIUM CHLORIDE 16-0.9 MG/250ML-% IV SOLN: INTRAVENOUS | Qty: 250

## 2022-10-03 MED FILL — ETOMIDATE 2 MG/ML IV SOLN: 2 MG/ML | INTRAVENOUS | Qty: 20

## 2022-10-03 MED FILL — MIDODRINE HCL 5 MG PO TABS: 5 MG | ORAL | Qty: 4

## 2022-10-03 MED FILL — SOLU-CORTEF 100 MG IJ SOLR: 100 MG | INTRAMUSCULAR | Qty: 2

## 2022-10-03 MED FILL — SODIUM BICARBONATE 8.4 % IV SOLN: 8.4 % | INTRAVENOUS | Qty: 100

## 2022-10-03 MED FILL — CEFEPIME HCL 1 G IJ SOLR: 1 g | INTRAMUSCULAR | Qty: 1000

## 2022-10-03 MED FILL — NOREPINEPHRINE BITARTRATE 1 MG/ML IV SOLN: 1 MG/ML | INTRAVENOUS | Qty: 32

## 2022-10-03 NOTE — Other (Signed)
[facility] CRRT    Notified by primary nurse re: CRRT rinse back due to instability. As per conversation with Ezzard Flax, RN, we will not restart CRRT at this time due to hemodynamic status. I did request that once patient is stable for restart to page [facility].

## 2022-10-03 NOTE — Progress Notes (Signed)
HISTORY OF PRESENT ILLNESS: 76 y.o. male with PMHx of ESRD on HD ([facility]), acute proctitis with rectal bleeding [name] and discharged on 12-28-21, anemia, DM 2, PVD, liver abscess, pancreatitis, and chronic sacral ulcer who presented to the emergency department from his nursing home for evaluation of hypotension. Patient was being discharged to SNF and on way to facility, per EMS, pressures were 80s over 50s on their arrival. They brought him back to [facility] ED for evaluation of hypotension. Patient's blood pressures were 80s over 50s in the ED. A femoral central line was placed and patient was started on levophed. He is also required supplemental oxygen, satting in the mid 80s on room air and around 90 with nasal cannula.  Now on nonrebreather.  Work-up notable for positive influenza swab and hyperbilirubinemia.  Right upper quadrant ultrasound ordered.     Interval history    11/30 - Still requiring pressors/HFNC, CT chest showed large L effusion, also showed known perihepatic fluid collection    12/1 - Still requiring pressors (requirements more today)/HFNC, L thora today c/b pneumothorax - now s/p CT    12/2: pt seen and examined, chest tube placed, on high flow o2, levophed, restless but not speaking      Current Facility-Administered Medications:     heparin (porcine) 1000 UNIT/ML injection 1,800 Units, 1,800 Units, IntraCATHeter, PRN, 1,800 Units at 10/02/22 0617 **AND** heparin (porcine) 1000 UNIT/ML injection 1,800 Units, 1,800 Units, IntraCATHeter, PRN, Ethelene HalHartle, Phillip Matthew, MD, 1,800 Units at 10/02/22 0617    albumin human 25% IV solution 25 g, 25 g, IntraVENous, PRN, Ethelene HalHartle, Phillip Matthew, MD, Stopped at 10/02/22 0900    norepinephrine (LEVOPHED) 16 mg in sodium chloride 0.9 % 250 mL infusion, 1-100 mcg/min, IntraVENous, Continuous, Adekunle, Oluwaseyi B, MD, Last Rate: 42.2 mL/hr at 10/03/22 0533, 45 mcg/min at 10/03/22 0533    dextrose  10 % infusion, , IntraVENous, Continuous, Adekunle, Oluwaseyi B, MD, Last Rate: 25 mL/hr at 10/02/22 1115, New Bag at 10/02/22 1115    sodium chloride flush 0.9 % injection 5-40 mL, 5-40 mL, IntraVENous, 2 times per day, Burnett CorrenteAdekunle, Oluwaseyi B, MD, 10 mL at 10/02/22 2206    HYDROmorphone HCl PF (DILAUDID) injection 1 mg, 1 mg, IntraVENous, Q4H PRN, Burnett CorrenteAdekunle, Oluwaseyi B, MD, 1 mg at 10/02/22 1531    HYDROcodone-acetaminophen (NORCO) 5-325 MG per tablet 1 tablet, 1 tablet, Oral, Q6H PRN, Burnett CorrenteAdekunle, Oluwaseyi B, MD    dexmedetomidine (PRECEDEX) 400 mcg in sodium chloride 0.9 % 100 mL infusion, 0.1-1.5 mcg/kg/hr, IntraVENous, Continuous, Halscott, Annie Mainorre L, MD, Last Rate: 8.3 mL/hr at 10/02/22 2350, 0.4 mcg/kg/hr at 10/02/22 2350    magnesium sulfate 2000 mg in 50 mL IVPB premix, 2,000 mg, IntraVENous, PRN, Cliffton Asterszekajlo, Michael, MD, Stopped at 10/01/22 1456    sodium phosphate 15 mmol in sodium chloride 0.9 % 250 mL IVPB, 15 mmol, IntraVENous, PRN, Cliffton Asterszekajlo, Michael, MD, Stopped at 10/01/22 0629    dextrose 50 % IV solution, 25 g, IntraVENous, PRN, Cliffton Asterszekajlo, Michael, MD, 25 g at 10/02/22 1846    midodrine (PROAMATINE) tablet 20 mg, 20 mg, Oral, q8h, Adekunle, Oluwaseyi B, MD, 20 mg at 10/02/22 0519    insulin lispro (HUMALOG) injection vial 0-4 Units, 0-4 Units, SubCUTAneous, TID WC, Adekunle, Oluwaseyi B, MD    insulin lispro (HUMALOG) injection vial 0-4 Units, 0-4 Units, SubCUTAneous, Nightly, Adekunle, Oluwaseyi B, MD    glucose chewable tablet 16 g, 4 tablet, Oral, PRN, Burnett CorrenteAdekunle, Oluwaseyi  B, MD    dextrose bolus 10% 125 mL, 125 mL, IntraVENous, PRN **OR** dextrose bolus 10% 250 mL, 250 mL, IntraVENous, PRN, Adekunle, Oluwaseyi B, MD    glucagon injection 1 mg, 1 mg, SubCUTAneous, PRN, Adekunle, Oluwaseyi B, MD    dextrose 10 % infusion, , IntraVENous, Continuous PRN, Burnett Corrente B, MD    oseltamivir (TAMIFLU) capsule 30 mg, 30 mg, Oral, Once per day on Mon Wed Fri, Matthew, Dellyn R, NP-C    heparin (porcine)  injection 5,000 Units, 5,000 Units, SubCUTAneous, BID, Burnett Corrente B, MD, 5,000 Units at 10/02/22 2207    [COMPLETED] ceFEPIme (MAXIPIME) 2,000 mg in sterile water 20 mL IV syringe, 2,000 mg, IntraVENous, Once, 2,000 mg at 10/01/22 1258 **FOLLOWED BY** cefepime (MAXIPIME) 1,000 mg in sodium chloride 0.9 % 50 mL IVPB (mini-bag), 1,000 mg, IntraVENous, Q24H, Burnett Corrente B, MD, Stopped at 10/02/22 1846    Vancomycin - pharmacy to dose, , Other, RX Placeholder, Burnett Corrente B, MD    magnesium sulfate 1000 mg in dextrose 5% 100 mL IVPB, 1,000 mg, IntraVENous, Once, Zaw, Kyaw T, MD    potassium & sodium phosphates (PHOS-NAK) 280-160-250 MG packet 250 mg, 1 packet, Oral, 4x Daily, Zaw, Kyaw T, MD, 250 mg at 10/01/22 2037    balsum peru-castor oil (VENELEX) ointment, , Topical, BID, Zaw, Kyaw T, MD, Given at 10/02/22 2200    collagenase ointment, , Topical, Daily, Zaw, Kyaw T, MD, Given at 10/01/22 1304    metronidazole (FLAGYL) 500 mg in 0.9% NaCl 100 mL IVPB premix, 500 mg, IntraVENous, Q8H, Adekunle, Oluwaseyi B, MD, Stopped at 10/03/22 0601    ipratropium (ATROVENT) 0.02 % nebulizer solution 0.5 mg, 0.5 mg, Nebulization, TID, Kendell Bane A, MD, 0.5 mg at 10/02/22 2201    sodium chloride flush 0.9 % injection 5-40 mL, 5-40 mL, IntraVENous, 2 times per day, Burnice Logan R, NP-C, 10 mL at 10/02/22 2207    sodium chloride flush 0.9 % injection 5-40 mL, 5-40 mL, IntraVENous, PRN, Molli Hazard, Dellyn R, NP-C    0.9 % sodium chloride infusion, , IntraVENous, PRN, Burnice Logan R, NP-C    albuterol (PROVENTIL) (2.5 MG/3ML) 0.083% nebulizer solution 2.5 mg, 2.5 mg, Nebulization, Q6H PRN, Burnice Logan R, NP-C    acetaminophen (TYLENOL) tablet 650 mg, 650 mg, Oral, Q6H PRN **OR** acetaminophen (TYLENOL) suppository 650 mg, 650 mg, Rectal, Q6H PRN, Burnice Logan R, NP-C, 650 mg at 10/03/22 5409    polyethylene glycol (GLYCOLAX) packet 17 g, 17 g, Oral, Daily PRN, Burnice Logan R, NP-C     ondansetron (ZOFRAN) injection 4 mg, 4 mg, IntraVENous, Q6H PRN, Molli Hazard, Dellyn R, NP-C    pantoprazole (PROTONIX) 40 mg in sodium chloride (PF) 0.9 % 10 mL injection, 40 mg, IntraVENous, Daily, Burnice Logan R, NP-C, 40 mg at 10/02/22 0940    sevelamer (RENVELA) packet 2.4 g, 2.4 g, Oral, TID WC, Matthew, Dellyn R, NP-C, 2.4 g at 10/01/22 1304    zinc sulfate (ZINCATE) 220 mg capsule - elemental zinc 50 mg, 50 mg, Oral, Daily, Burnice Logan R, NP-C, 50 mg at 10/01/22 0836    [Held by provider] mirtazapine (REMERON) tablet 7.5 mg, 7.5 mg, Oral, Nightly, Matthew, Dellyn R, NP-C    PHYSICAL EXAMINATION:  General - chronically ill, restless, resp distress  Neuro - Generalized weakness not following commands  CV - tachy, regular  Lungs - coarse BL, inc effort  Abd - soft, nd  Skin: mottled  Ext - warm, bilat partial foot amputations  VITAL SIGNS:  Vitals:    10/03/22 0600   BP: (!) 83/50   Pulse: (!) 118   Resp: 20   Temp:    SpO2: 100%       LABORATORY ANALYSIS:  @LABSLAST72HRS @  No results displayed because visit has over 200 results.        Encounter Date: 09/18/2022   EKG 12 Lead   Result Value    Ventricular Rate 105    Atrial Rate 105    P-R Interval 158    QRS Duration 72    Q-T Interval 366    QTc Calculation (Bazett) 483    P Axis 30    R Axis 0    T Axis 44    Diagnosis      Sinus tachycardia  Nonspecific T wave abnormality  Abnormal ECG  When compared with ECG of 26-Sep-2022 09:12,  Vent. rate has increased BY  36 BPM  Confirmed by 28-Sep-2022 [phone]) on 09/10/2022 4:35:10 PM         RADIOGRAPHIC STUDIES:  10/02/2022 THORACENTESIS Which side should the procedure be performed? Left  Narrative: PROCEDURE:  CHEST TUBE PLACEMENT    HISTORY: Aleem Smedley is a 76 years old Male with left pneumothorax.    OPERATOR:  61, NP    ATTENDING:  Donna Christen, MD    CONSENT:  After full discussion of the procedure, including risks, benefits and  alternatives, both verbal and written consent were  obtained.    TECHNIQUE: A timeout was called to verify the correct patient, procedure, site  and allergies.    This procedure was performed at the bedside with ultrasound guidance. The skin  was prepped and draped in sterile fashion.  1% lidocaine was utilized for local  anesthesia.  A small dermatotomy was made.  A centesis catheter needle was  inserted into the pleural space.  There was prompt return of air. The needle was  removed and a 0.035 Rosen wire was advanced through the catheter.  The catheter  was removed and the tract was serially dilated.  An 8 French locking pigtail  thoracostomy tube was advanced over the wire.  The wire was removed and tube  position within the pleural space was confirmed by the rapid aspiration of air.   The tube was connected to a water seal drainage system and secured to the  patient's skin with 2-0 non-absorbable suture.  A dry sterile occlusive dressing  was applied.  There were no immediate complications.  The patient tolerated the  procedure without difficulty.    COMPLICATIONS:  None    ESTIMATED BLOOD LOSS:  < 1 ml  Impression: Technically successful left chest tube placement.  A post procedure chest x-ray  is pending.  [place] THORACENTESIS Which side should the procedure be performed? Left  Narrative: PROCEDURE:  ULTRASOUND GUIDED THORACENTESIS     HISTORY: Truth Feeny is a 76 years old Male with left pleural effusion.    OPERATOR:  61, NP    ATTENDING:  Donna Christen, MD    CONSENT:  After full discussion of the procedure, including risks, benefits and  alternatives, both verbal and written consent were obtained.    TECHNIQUE: A timeout was called to verify the correct patient, procedure, site  and allergies.    Preliminary ultrasound imaging of the left hemithorax demonstrated pleural  effusion amenable to thoracentesis.  An appropriate site for thoracentesis was  marked.  Images were archived to PACS.  The skin  was prepped and draped in  sterile fashion.  1%  lidocaine was utilized for local anesthesia.  A small  dermatotomy was made.  A centesis catheter needle was then inserted into the  pleural space using direct sonographic guidance.  There was return of yellow  fluid through the needle.  The needle was removed and the catheter was placed to  suction.  A total of 100 ml of fluid was evacuated.  The catheter was removed.   Pressure was applied locally at the puncture site.  A dry sterile dressing was  applied.  There were no immediate complications.  The patient tolerated the  procedure without difficulty.    COMPLICATIONS:  None    ESTIMATED BLOOD LOSS:  < 1 ml    SPECIMENS:  Fluid sent for analysis  Impression: Technically successful ultrasound guided left thoracentesis with evacuation of  100 ml of fluid.  A post procedure chest x-ray is pending.  XR CHEST PORTABLE  Narrative: INDICATION:  s/p left chest tube placement     Exam: Portable chest 1408.     Comparison: Earlier same day.    Findings: A left-sided pigtail pleural catheter has been placed. The left  pneumothorax has decreased in size but remains moderate in size. There is  mediastinal shift to the right but in part this is accentuated by rotation of  the film. This is likely unchanged. There is partial collapse of the left lung.  Patchy right perihilar airspace disease. Right-sided port unchanged and no  right-sided pneumothorax.  Impression: 1. Decreased left-sided pneumothorax post chest tube placement  XR CHEST PORTABLE  Narrative: EXAM: XR CHEST PORTABLE    INDICATION: s/p thoracentesis left    COMPARISON: 2022/10/21    TECHNIQUE: AP portable semierect view of the chest.     FINDINGS:  Right sided central venous catheter is again noted.  There is a new moderate-sized left-sided pneumothorax.  There is up to 4.1 cm of  pleural separation at the apex and 4.8 cm of separation at the base.  There are small bilateral pleural effusions, left greater than right.  There is  vascular congestion.  The  cardiomediastinal configuration is within normal limits.  No acute bony abnormalities.  Impression: Moderate-sized left-sided pneumothorax.  The clinical team is aware.      Films/reports reviewed, rt lung infiltrates increased, persistent ptx on left side, portacath right     ASSESSMENT:  76 y.o. male with PMHx of ESRD on HD (MWF at [place] home), chronic hypotension, acute proctitis with rectal bleeding, anemia, DM 2, PVD, perforated viscus s/p ex lap c/b liver abscess, pancreatitis, and chronic sacral ulcer being managed for septic shock, influenza +Ve, acute hypoxic resp failure      IMPRESSION AND PLAN:    Neuro - Delirium prevention strategies, pain control as needed - fentanyl given for tube placement - helped with agitation    CV - Hypotension/shock, hx of chronic hypotension  Cont HD monitoring, MAP goal > 60 for now, on levo - wean as tolerated, cont if able to take po, check cortisol level    Pulm - Hypoxic resp failure, BL effusions, BL LL atelectasis, multifocal PNA, flu +ve, L pneumothorax, new tube placed - repeat cxr, may need larger tube, ct surgery eval, if sat under 90 on high flow will need intubation    GI - Cirrhosis, Chronic Perihepatic fluid collection  Npo for now - may need intubation    Renal - ESRD on HD  Dose  meds renally, correct electrolyte derangements as needed, HD per renal    Heme - heparin for DVT ppx    ID - Presumed sepsis unclear source, slight leukocytosis, flu +ve, chronic perihepatic collection  Cont BSabx - vanc, cefepime for now, f/u micro studies, cont Tamiflu, IR consulted for possible perihepatic fluid collection drainage    Endo - Keep glu 100 - 180; currently requiring dextrose containing infusion to maintain glu level     Pt with vital organ dysfunction  High risk of complications, morbidity, mortality  Prognosis very guarded, palliative care team consulted  CCT 45 min eop

## 2022-10-03 NOTE — Progress Notes (Signed)
Spiritual Care Assessment/Progress Note  [facility]    Name: Jaime [name] MRN: [id number]    Age: 76 y.o.     Sex: male   Language: English     Date: 10/03/2022            Total Time Calculated: 45 min              Spiritual Assessment begun in [facility] CORONARY CARE  Service Provided For:: Significant other  Referral/Consult From:: Nurse  Encounter Overview/Reason : Grief, Loss, and Adjustments    Spiritual beliefs:      []  Involved in a faith tradition/spiritual practice:      []  Supported by a faith community:      []  Claims no spiritual orientation:      []  Seeking spiritual identity:           [x]  Adheres to an individual form of spirituality:      []  Not able to assess:                Identified resources for coping and support system:   Support System: Spouse       [x]  Prayer                  []  Devotional reading               []  Music                  []  Guided Imagery     []  Pet visits                                        []  Other: (COMMENT)     Specific area/focus of visit   Encounter:    Crisis: Type: Emotional distress  Spiritual/Emotional needs: Type: Spiritual Distress, Emotional Distress  Ritual, Rites and Sacraments:    Grief, Loss, and Adjustments: Type: Anticipatory Grief  Ethics/Mediation:    Behavioral Health:    Palliative Care:    Advance Care Planning:      Plan/Referrals: Continue Support (comment) (If the nurse would think so.)    Narrative:     This was in response to a page from a nurse in [place]. The nurse informed the chaplain that a patient was in critical condition and might not last the night, and the wife was sad and alone in the waiting room. The chaplain spent time with the wife. She said she believed in a personal God. She had no other family support. She was spiritually distressed. She was at the waiting room because the nurses were doing something with the patient.     The chaplain used sustaining presence and active listening to the story of the wife. The chaplain  discussed her faith, her belief in God, and after life. She believed in the will of God, and was consoled with the idea of the after life. Her husband sick a long time, and she was open to what may happen. They were together for 45 years. The chaplain stayed with the wife until she was calm and relaxed. She was open to God's will. She expressed much gratitude for the visit and presence of the chaplain. The chaplain assured her of her prayers and the safety of her return drive to [place].    [place]  Page a Clinical biochemist [phone]

## 2022-10-03 NOTE — Progress Notes (Signed)
Nephrology Progress Note  [facility]       [facility]  [address]  [place], [place] [address]  Phone - [phone]  Fax - [phone]                 Patient: Jaime [name]                     Date of Birth: 1946/08/18        Date- 10/03/2022                                     Admit Date: 09/21/2022   CC: Follow up for esrd          IMPRESSION & PLAN:   Esrd  Hypotension  Resp failure  Anemia of ckd  GI bleed  Hypophosphatemia  Influenza aA+  PVD: S/p B/L TMA  DM2  Hx of pancreatitis  Hx of liver abscess s/p drainage  Sacral ulcer      PLAN-  START CRRT  Continue vasopressor support  Stop renvela  Follow labs in am     Subjective:  Interval History:   -  patient is on vent    Bp low  Objective:   Vitals:    10/03/22 0800 10/03/22 0900 10/03/22 1000 10/03/22 1100   BP: (!) 89/59 101/65 (!) 93/57 (!) 154/123   Pulse: (!) 133 (!) 121 (!) 118    Resp: 23 22 23     Temp: 100 F (37.8 C)      TempSrc: Axillary      SpO2: 94% 91%     Weight:       Height:          I/O last 3 completed shifts:  In: 1691.5 [I.V.:1191.5]  Out: 2550 [Chest Tube:1050]  I/O this shift:  In: 263.6 [I.V.:246.6; IV Piggyback:16.9]  Out: -       Physical exam:    GEN: intubated on vent  NECK-no mass, ET TUBE  RESP:  no wheezing  EXT: + Edema   NEURO:Can't access due to patient's current condition   Right permacath +    Chart reviewed.         Pertinent Notes reviewed.     Data Review :  Lab Results   Component Value Date/Time    NA 137 10/03/2022 04:47 AM    K 4.1 10/03/2022 04:47 AM    CL 103 10/03/2022 04:47 AM    CO2 25 10/03/2022 04:47 AM    BUN 10 10/03/2022 04:47 AM    CREATININE 1.68 10/03/2022 04:47 AM    GLUCOSE 150 10/03/2022 04:47 AM    CALCIUM 7.8 10/03/2022 04:47 AM       Lab Results   Component Value Date    WBC 17.2 (H) 10/03/2022    HGB 8.5 (L) 10/03/2022    HCT 26.8 (L) 10/03/2022    MCV 92.4 10/03/2022    PLT 112 (L) 10/03/2022      Recent Labs     10/01/22  0112  10/02/22  0317 10/03/22  0447   NA 138 136 137   K 3.8 4.1 4.1   CL 104 102 103   CO2 28 27 25    GLUCOSE 62* 98 150*   BUN 14 19 10    CREATININE 1.40* 1.97* 1.68*   CALCIUM 7.8* 8.0*  7.8*       Recent Labs     10/01/22  0112 10/02/22  0317 10/03/22  0447   WBC 12.2* 11.8* 17.2*   RBC 2.98* 3.02* 2.90*   HGB 8.6* 8.7* 8.5*   HCT 25.0* 25.7* 26.8*   MCV 83.9 85.1 92.4   MCH 28.9 28.8 29.3   MCHC 34.4 33.9 31.7   RDW 19.2* 20.0* 19.9*   PLT 107* 106* 112*   MPV 10.1 10.7 11.3     No results found for: "IRON", "TIBC", "LABIRON"  No results found for: "PTH"  Lab Results   Component Value Date/Time    LABA1C 7.7 (H) 06/02/2022 12:22 PM     No results found for: "COLORU", "CLARITYU", "GLUCOSEU", "BILIRUBINUR", "KETUA", "SPECGRAV", "BLOODU", "PHUR", "PROTEINU", "NITRU", "LEUKOCYTESUR", "URINETYPE"  [name] Results (most recent):  Medication list  reviewed  Current Facility-Administered Medications   Medication Dose Route Frequency    vasopressin 20 Units in sodium chloride 0.9 % 100 mL infusion  0.01-0.03 Units/min IntraVENous Continuous    fentaNYL (SUBLIMAZE) injection 50 mcg  50 mcg IntraVENous Q2H PRN    phenylephrine (NEO-SYNEPHRINE) 50 mg in sodium chloride 0.9 % 250 mL infusion (Vial2Bag)  10-300 mcg/min IntraVENous Continuous    midazolam PF (VERSED) injection 2 mg  2 mg IntraVENous Once    etomidate (AMIDATE) injection 10 mg  10 mg IntraVENous Once    fentaNYL (SUBLIMAZE) 1,000 mcg in sodium chloride 0.9% 100 mL infusion  25-200 mcg/hr IntraVENous Continuous    etomidate (AMIDATE) 2 MG/ML injection        midazolam (VERSED) 2 MG/2ML injection        prismaSol BGK 4/2.5 dialysis solution   Dialysis Continuous    heparin (porcine) 1000 UNIT/ML injection 1,800 Units  1,800 Units IntraCATHeter PRN    And    heparin (porcine) 1000 UNIT/ML injection 1,800 Units  1,800 Units IntraCATHeter PRN    albumin human 25% IV solution 25 g  25 g IntraVENous PRN    norepinephrine (LEVOPHED) 16 mg in sodium chloride 0.9 % 250 mL infusion   1-100 mcg/min IntraVENous Continuous    dextrose 10 % infusion   IntraVENous Continuous    sodium chloride flush 0.9 % injection 5-40 mL  5-40 mL IntraVENous 2 times per day    HYDROmorphone HCl PF (DILAUDID) injection 1 mg  1 mg IntraVENous Q4H PRN    HYDROcodone-acetaminophen (NORCO) 5-325 MG per tablet 1 tablet  1 tablet Oral Q6H PRN    dexmedetomidine (PRECEDEX) 400 mcg in sodium chloride 0.9 % 100 mL infusion  0.1-1.5 mcg/kg/hr IntraVENous Continuous    magnesium sulfate 2000 mg in 50 mL IVPB premix  2,000 mg IntraVENous PRN    sodium phosphate 15 mmol in sodium chloride 0.9 % 250 mL IVPB  15 mmol IntraVENous PRN    dextrose 50 % IV solution  25 g IntraVENous PRN    midodrine (PROAMATINE) tablet 20 mg  20 mg Oral q8h    insulin lispro (HUMALOG) injection vial 0-4 Units  0-4 Units SubCUTAneous TID WC    insulin lispro (HUMALOG) injection vial 0-4 Units  0-4 Units SubCUTAneous Nightly    glucose chewable tablet 16 g  4 tablet Oral PRN    dextrose bolus 10% 125 mL  125 mL IntraVENous PRN    Or    dextrose bolus 10% 250 mL  250 mL IntraVENous PRN    glucagon injection 1 mg  1 mg SubCUTAneous PRN    dextrose 10 %  infusion   IntraVENous Continuous PRN    oseltamivir (TAMIFLU) capsule 30 mg  30 mg Oral Once per day on Mon Wed Fri    heparin (porcine) injection 5,000 Units  5,000 Units SubCUTAneous BID    cefepime (MAXIPIME) 1,000 mg in sodium chloride 0.9 % 50 mL IVPB (mini-bag)  1,000 mg IntraVENous Q24H    Vancomycin - pharmacy to dose   Other RX Placeholder    magnesium sulfate 1000 mg in dextrose 5% 100 mL IVPB  1,000 mg IntraVENous Once    balsum peru-castor oil (VENELEX) ointment   Topical BID    collagenase ointment   Topical Daily    metronidazole (FLAGYL) 500 mg in 0.9% NaCl 100 mL IVPB premix  500 mg IntraVENous Q8H    ipratropium (ATROVENT) 0.02 % nebulizer solution 0.5 mg  0.5 mg Nebulization TID    sodium chloride flush 0.9 % injection 5-40 mL  5-40 mL IntraVENous 2 times per day    sodium chloride flush  0.9 % injection 5-40 mL  5-40 mL IntraVENous PRN    0.9 % sodium chloride infusion   IntraVENous PRN    albuterol (PROVENTIL) (2.5 MG/3ML) 0.083% nebulizer solution 2.5 mg  2.5 mg Nebulization Q6H PRN    acetaminophen (TYLENOL) tablet 650 mg  650 mg Oral Q6H PRN    Or    acetaminophen (TYLENOL) suppository 650 mg  650 mg Rectal Q6H PRN    polyethylene glycol (GLYCOLAX) packet 17 g  17 g Oral Daily PRN    ondansetron (ZOFRAN) injection 4 mg  4 mg IntraVENous Q6H PRN    pantoprazole (PROTONIX) 40 mg in sodium chloride (PF) 0.9 % 10 mL injection  40 mg IntraVENous Daily    zinc sulfate (ZINCATE) 220 mg capsule - elemental zinc 50 mg  50 mg Oral Daily    [Held by provider] mirtazapine (REMERON) tablet 7.5 mg  7.5 mg Oral Nightly        Harlow Ohms, MD  10/03/2022

## 2022-10-03 NOTE — Procedures (Signed)
CTSP for resp distress, hypotension.  Pt not communicative,   Breathing rate, hr high  Sbp 100 on levophed  Cxr shows persistent left ptx    Chest tube placed emergently without consent  Left axillary area prepped/draped  1% lidocaine infiltrated  Nick incision made  Cook needle inserted over rib, good return of air, some blood tinged fluid  Wire advanced  Track dilated and 14 fr pigtail tube placed over wires  Pos air and fluid return  Suture placed  Xray pending    Tolerated well  Ebl less than 5 cc

## 2022-10-03 NOTE — Procedures (Signed)
Procedure Note - Arterial Access:   Performed by Cyndi Bender, APRN - NP.  Diagnosis: Septic shock  Insertion Date: 10/03/22   Time: 4:16 PM   Obtained Consent? yes; emergent   Procedure Location:  CCU.      Immediately prior to the procedure, the patient was reevaluated and found suitable for the planned procedure and any planned medications.  Immediately prior to the procedure a time out was called to verify the correct patient, procedure, equipment, staff, and marking as appropriate.  Collateral circulation confirmed with Zenia Resides test.     Line Bundle:  Full sterile barrier precautions used.  5 mL 1% Lidocaine placed at insertion site.      Method: Seldinger technique.   Site Prep: ChloraPrep.   Procedure: Arterial Catheter Insertion in Left, Brachial Artery   Catheter inserted into a new site.     Number of Attempts:  2  Indication: Monitoring and Blood Drawing.     There was bright red, pulsatile blood return.  Femoral Site? no. If Yes, reason femoral site was chosen: na  Catheter secured. Biopatch/CHG sterile bio-occlusive dressing  in place? yes.  Complication None.   The procedure was tolerated well.    Cyndi Bender, APRN - NP   Critical Care Medicine  [facility]

## 2022-10-03 NOTE — Progress Notes (Addendum)
2000 - Report received from [place], RN. BP 40-50/30s, Epi gtt titrated & maxed. Albumin bolus given per MD. BP remains 50s/30s, 1L LR bolus given per MD. Dr. Noah Delaine at bedside & spoke w/ pt's wife, update given on pt status.

## 2022-10-03 NOTE — Other (Addendum)
CRRT New Start @ 1405     10/03/22 1356   Treatment   $CRRT $Yes   Machine #   (PM12)   Cartridge Lot #   [id number])   Therapy Type CVVH   Pressures   Access (mmHg) -65 mmHg   Return/Venous (mmHg) 53 mmHg   Filter (mmHg) 137 mmHg   TMP Pressure (mmHg) 36 mmHg   Pressure Drop (mmHg) 44 mmHg   Deaeration Chamber Check Yes   Flow Rates   Therapy Fluid (L/hr) 2 L/hr   Blood Flow (mL/min) 200 mL/min   Replacement Fluid Pre-Filter (mL/hr) 500 mL/hr   Replacement Filter Post-Filter (mL/hr) 500 mL/hr   Pre-Blood Pump (mL/hr) 1000 mL/hr   System Used   System Used [employer] Calculation   (D) Physician Ordered Hourly Removal (mL) -50 ml   CRRT Activities   Intervention Initiated   Ultrafiltrate Assessment   Ultrafiltrate Color Yellow/straw   Ultrafiltrate Appearance Clear   Tunneled Hemodialysis Catheter Right Subclavian   Placement Date: 07/01/22   Present on Admission/Arrival: No  Inserted by: Hope Riddell-NP  Insertion Practices: Chlorohexadine skin antisepsis;Maximal barrier precautions;Sterile ultrasound technique;Optimal catheter site selection;Hand hygiene  Orien...   Access Status  Accessed   Continued need for line? Yes   Site Assessment Clean, dry & intact   CVC Lumen Status Infusing   Venous Lumen Status Blood return noted;Flushed;Infusing   Arterial Lumen Status Blood return noted;Flushed;Infusing   Line Care Ports disinfected;Connections checked and tightened   Dressing Type Bacteriocidal;Transparent   Date of Last Dressing Change 10/02/22   Dressing Status Clean, dry & intact   Dressing Change Due 10/06/22     Primary RN SBAR: Leona Carry, RN  Patient Education: Pt intubated/sedated.  Hepatitis B Surface Ag   Date/Time Value Ref Range Status   09/18/2022 05:05 AM <0.10 Index Final     Hep B S Ab   Date/Time Value Ref Range Status   06/29/2022 09:53 AM <3.10 mIU/mL Final     BP: 73/53  HR: 120    CRRT initiated per MD order. Patient, code status, labs, and orders verified. Consents obtained. Line placement  confirmed by chest xray. Filter set-up, primed, tested and running well. RIJ tunneled CVC, dressing CDI dated 10/02/22. No signs of redness, drainage, or infection visualized. Each catheter limb disinfected for 60 seconds per limb with alcohol swabs. Caps removed, dialysis CVC hub scrubbed with Prevantics for 5 seconds, followed by a 5 second dry time per Hospital P&P. +aspiration/+flush x 2 ports. Lines visible and connections secure with blood warmer to return line at 37*C. Pt started as Urgent Start for life saving procedure, primary RN to get verbal consent from pt wife once she is able to reach her.

## 2022-10-03 NOTE — Progress Notes (Addendum)
0730 Bedside and Verbal shift change report given to Vella Colquitt, RN (Soil scientist) by Eustaquio Maize, RN (offgoing nurse). Report included the following information Nurse Handoff Report, Intake/Output, MAR, Recent Results, and Cardiac Rhythm ST .   Pts HR sustaining 160-180's - Dr. Marykay Lex at bedside - orders received for amio bolus & start vaso gtt  0805 Pts HR 130's post amio bolus - EKG obtained   1000 Spoke w/ Dr. Posey Pronto - updated on pts status - orders received to start CRRT  [facility] made aware   1120 Pt agitated HR 140's BP 102/89 - prn fentanyl given  1140 BP 60s/50s Temp 101.3   Prn tylenol given - pt unresponsive to pain, decreased respiratory drive - Dr. Marykay Lex made aware  1150 Dr. Marykay Lex at bedside plan to intubate   Orders received to start neo gtt, 2mg  versed, 10mg  etomidate, fentanyl gtt as needed  RT paged   +ETT 7.75mm 25@ teeth  OGT placed @ 60  Cxr & KUB ordered per protocol  Orders for ABG - MD made aware of results  1405 [facility] RN at bedside - CRRT started  [place] Dr. Marykay Lex at bedside for [facility] CVC placement  1500 Pts BP 42/29 - CRRT pt fluid removal kept at 0 - Dr. Posey Pronto made aware  Albumin 5% 25g orders received from Dr. Marykay Lex  1530 NP at bedside a-line plcaed  Critical CO2 59 - Dr. Marykay Lex made aware - asked to escalate to nephro  1545 Spoke w/ Dr. Posey Pronto orders received for 2 amps Bicarp push & start bicarb gtt  1600 RT at bedside - ABG obtained   Vent changes made per protocol by RT  Critical lactic 15.46 - MD made aware  1745 BP 67/41 HR 120-130 - maxed on vaso, levo & neo - updated Dr. Marykay Lex - orders received for albumin 5% 25g  1800 Bladder scan complete 12cc of urine in bladder  1835 BP 53/34 - MD made aware - CRRT rinsed back, [facility] paged  [place] Dr. Marykay Lex at bedside - orders received for amio bolus & gtt  1910 BP 49/32 HR 119 - Dr. Claris Che made aware   Dr. Claris Che at bedside - HR 80's - orders received to start epi gtt, albumin 5% 25g & CXr  1930 Bedside and Verbal shift change report given to Eustaquio Maize, Therapist, sports (Soil scientist) by  Kyra Searles, RN (offgoing nurse). Report included the following information Nurse Handoff Report, Intake/Output, MAR, Recent Results, and Cardiac Rhythm afib .

## 2022-10-03 NOTE — Significant Event (Signed)
Called to bedside due to hypotension via arterial line with BP of 50-60s/40s, with appropriate appearing waveform, patient has had other similar episodes during day contemporaneously with dialysis and/or narcotic administration. New chest tube placed on left side this AM in addition to prior tube remaining in place, these appear to be functioning well and without large air leak apparent at this time. Bedside ultrasound performed, no evidence of cardiac tamponade nor pneumo/hemothoraces, though cardiac function does appear diminished with approximate ejection fraction ~30% with some apical and posterior wall motion abnormalities, no evidence of obstructive pathology; chest x-ray and EKG ordered for further assessment. Epinephrine ordered at this time as well as further albumin boluses.    Jaime Elster L. Cyndy Braver, MD, MS  Critical Care Medicine    Critical care time: 25 minutes

## 2022-10-03 NOTE — Procedures (Signed)
Pt with progressive decline in ms and agonal breathing.    Bag ventilated sat 100  Airway suctioned - clear  Sedated - versed/etomidate  Video scope usd - excellent cord visualization  7.5 ett passed into trachea  Pos bs and etco2  Xray pending

## 2022-10-03 NOTE — Progress Notes (Signed)
Physical Therapy - defer  Chart reviewed in prep for therapy assessment and treatment.  Noted strict bedrest order in place 09/24/2022, HR 130-160s, and CRRT to start.  RN is recommending therapy sign off at this time, will consult again when pt is able to participate.

## 2022-10-03 NOTE — Procedures (Signed)
Critical Care    Indication: Need for vasopressors    Risks, benefits, alternatives explained and consent obtained.    Patient positioned in Trendelenburg.  Full sterile barrier precautions used.  7-Step Sterility Protocol followed.  (cap, mask sterile gown, sterile gloves, large sterile sheet, hand hygiene, 2% chlorhexidine for cutaneous antisepsis)  5 mL 1% Lidocaine placed at insertion site.      Using ultrasound guidance,   Left internal jugular cannulated x 1 attempt(s) utilizing the modified Seldinger technique.    mild  Good blood return.  Catheter secured & Biopatch applied.  Sterile Tegaderm placed.    Ebl: minimal  Tolerated well  CXR pending.      2:45 PM

## 2022-10-04 LAB — ARTERIAL BLOOD GAS, POC
Base Deficit: 26.4 mmol/L
FIO2: 50 %
POC HCO3: 3.8 MMOL/L — ABNORMAL LOW (ref 22–26)
POC O2 SAT: 93.4 % (ref 92–97)
POC PO2: 113 MMHG — ABNORMAL HIGH (ref 80–100)
POC pCO2: 19.7 MMHG — ABNORMAL LOW (ref 35.0–45.0)
POC pH: 6.89 — CL (ref 7.35–7.45)

## 2022-10-04 LAB — POCT GLUCOSE: POC Glucose: 80 mg/dL (ref 65–117)

## 2022-10-04 LAB — CBC WITH AUTO DIFFERENTIAL
Basophils %: 0 % (ref 0–1)
Basophils Absolute: 0 10*3/uL (ref 0.0–0.1)
Eosinophils %: 0 % (ref 0–7)
Eosinophils Absolute: 0 10*3/uL (ref 0.0–0.4)
Hematocrit: 15.6 % — CL (ref 36.6–50.3)
Hemoglobin: 3.9 g/dL — CL (ref 12.1–17.0)
Immature Granulocytes %: 0 %
Immature Granulocytes Absolute: 0 10*3/uL
Lymphocytes %: 16 % (ref 12–49)
Lymphocytes Absolute: 1.8 10*3/uL (ref 0.8–3.5)
MCH: 28.9 PG (ref 26.0–34.0)
MCHC: 25 g/dL — ABNORMAL LOW (ref 30.0–36.5)
MCV: 115.6 FL — ABNORMAL HIGH (ref 80.0–99.0)
MPV: 10.5 FL (ref 8.9–12.9)
Metamyelocytes: 1 % — ABNORMAL HIGH
Monocytes %: 3 % — ABNORMAL LOW (ref 5–13)
Monocytes Absolute: 0.3 10*3/uL (ref 0.0–1.0)
Myelocytes: 1 % — ABNORMAL HIGH
Neutrophils %: 79 % — ABNORMAL HIGH (ref 32–75)
Neutrophils Absolute: 8.7 10*3/uL — ABNORMAL HIGH (ref 1.8–8.0)
Nucleated RBCs: 0.3 PER 100 WBC — ABNORMAL HIGH
Platelets: 25 10*3/uL — CL (ref 150–400)
RBC: 1.35 M/uL — ABNORMAL LOW (ref 4.10–5.70)
RDW: 19.9 % — ABNORMAL HIGH (ref 11.5–14.5)
WBC: 11 10*3/uL (ref 4.1–11.1)
nRBC: 0.03 10*3/uL — ABNORMAL HIGH (ref 0.00–0.01)

## 2022-10-04 LAB — EKG 12-LEAD
Atrial Rate: 128 {beats}/min
P-R Interval: 152 ms
Q-T Interval: 338 ms
QRS Duration: 154 ms
QTc Calculation (Bazett): 493 ms
R Axis: -35 degrees
T Axis: 117 degrees
Ventricular Rate: 128 {beats}/min

## 2022-10-04 LAB — BASIC METABOLIC PANEL W/ REFLEX TO MG FOR LOW K
BUN/Creatinine Ratio: 5 — ABNORMAL LOW (ref 12–20)
BUN: 9 MG/DL (ref 6–20)
CO2: <5 mmol/L — CL (ref 21–32)
Calcium: 7.1 MG/DL — ABNORMAL LOW (ref 8.5–10.1)
Chloride: 104 mmol/L (ref 97–108)
Creatinine: 1.64 MG/DL — ABNORMAL HIGH (ref 0.70–1.30)
Est, Glom Filt Rate: 43 mL/min/{1.73_m2} — ABNORMAL LOW (ref >60)
Glucose: 79 mg/dL (ref 65–100)
Potassium: 5.4 mmol/L — ABNORMAL HIGH (ref 3.5–5.1)
Sodium: 137 mmol/L (ref 136–145)

## 2022-10-04 LAB — PHOSPHORUS: Phosphorus: 5.7 MG/DL — ABNORMAL HIGH (ref 2.6–4.7)

## 2022-10-04 LAB — LACTIC ACID: Lactic Acid, Plasma: 26.2 MMOL/L (ref 0.4–2.0)

## 2022-10-04 LAB — MAGNESIUM: Magnesium: 2.3 mg/dL (ref 1.6–2.4)

## 2022-10-04 MED ORDER — EPINEPHRINE 1 MG/ML IJ SOLN (MIXTURES ONLY)
0.9 % | INTRAVENOUS | Status: DC
Start: 2022-10-04 — End: 2022-10-03

## 2022-10-04 MED ORDER — ALBUMIN HUMAN 5 % IV SOLN
5 % | INTRAVENOUS | Status: AC
Start: 2022-10-04 — End: 2022-10-04
  Administered 2022-10-04: 12:00:00 25 via INTRAVENOUS

## 2022-10-04 MED ORDER — LACTATED RINGERS IV BOLUS
Freq: Once | INTRAVENOUS | Status: AC
Start: 2022-10-04 — End: 2022-10-03
  Administered 2022-10-04: 02:00:00 1000 mL via INTRAVENOUS

## 2022-10-04 MED ORDER — ALBUMIN HUMAN 5 % IV SOLN
5 % | Freq: Once | INTRAVENOUS | Status: AC
Start: 2022-10-04 — End: 2022-10-04
  Administered 2022-10-04: 07:00:00 25 g via INTRAVENOUS

## 2022-10-04 MED ORDER — LACTATED RINGERS IV BOLUS
Freq: Once | INTRAVENOUS | Status: AC
Start: 2022-10-04 — End: 2022-10-04
  Administered 2022-10-04: 07:00:00 1000 mL via INTRAVENOUS

## 2022-10-04 MED ORDER — ALBUMIN HUMAN 5 % IV SOLN
5 % | Freq: Once | INTRAVENOUS | Status: AC
Start: 2022-10-04 — End: 2022-10-04

## 2022-10-04 MED ORDER — EPINEPHRINE 1 MG/ML IJ SOLN (MIXTURES ONLY)
0.9 % | INTRAVENOUS | Status: DC
Start: 2022-10-04 — End: 2022-10-04
  Administered 2022-10-04 (×2): 20 ug/min via INTRAVENOUS
  Administered 2022-10-04: 5 ug/min via INTRAVENOUS

## 2022-10-04 MED ORDER — ALBUMIN HUMAN 5 % IV SOLN
5 % | Freq: Once | INTRAVENOUS | Status: AC
Start: 2022-10-04 — End: 2022-10-03
  Administered 2022-10-04: 01:00:00 25 g via INTRAVENOUS

## 2022-10-04 MED ORDER — LACTATED RINGERS IV BOLUS
Freq: Once | INTRAVENOUS | Status: AC
Start: 2022-10-04 — End: 2022-10-04
  Administered 2022-10-04: 12:00:00 1000 mL via INTRAVENOUS

## 2022-10-04 MED FILL — METRONIDAZOLE 500 MG/100ML IV SOLN: 500 MG/100ML | INTRAVENOUS | Qty: 100

## 2022-10-04 MED FILL — ALBUTEIN 5 % IV SOLN: 5 % | INTRAVENOUS | Qty: 500

## 2022-10-04 MED FILL — DEXMEDETOMIDINE HCL IN NACL 400 MCG/100ML IV SOLN: 400 MCG/100ML | INTRAVENOUS | Qty: 100

## 2022-10-04 MED FILL — IPRATROPIUM BROMIDE 0.02 % IN SOLN: 0.02 % | RESPIRATORY_TRACT | Qty: 2.5

## 2022-10-04 MED FILL — LACTATED RINGERS IV SOLN: INTRAVENOUS | Qty: 1000

## 2022-10-04 MED FILL — ADRENALIN 30 MG/30ML IJ SOLN: 30 MG/ML | INTRAMUSCULAR | Qty: 5

## 2022-10-04 MED FILL — LEVOPHED 1 MG/ML IV SOLN: 1 MG/ML | INTRAVENOUS | Qty: 32

## 2022-10-04 MED FILL — HEPARIN SODIUM (PORCINE) 1000 UNIT/ML IJ SOLN: 1000 UNIT/ML | INTRAMUSCULAR | Qty: 2

## 2022-10-04 MED FILL — VASOSTRICT 20 UNIT/ML IV SOLN: 20 UNIT/ML | INTRAVENOUS | Qty: 2

## 2022-10-04 MED FILL — NOREPINEPHRINE BITARTRATE 1 MG/ML IV SOLN: 1 MG/ML | INTRAVENOUS | Qty: 32

## 2022-10-04 MED FILL — EPINEPHRINE (ANAPHYLAXIS) 30 MG/30ML IJ SOLN: 30 MG/ML | INTRAMUSCULAR | Qty: 5

## 2022-10-04 MED FILL — VAZCULEP 10 MG/ML IV SOLN: 10 MG/ML | INTRAVENOUS | Qty: 10

## 2022-10-04 MED FILL — HEPARIN SODIUM (PORCINE) 5000 UNIT/ML IJ SOLN: 5000 UNIT/ML | INTRAMUSCULAR | Qty: 1

## 2022-10-04 MED FILL — PHENYLEPHRINE HCL (PRESSORS) 10 MG/ML IV SOLN: 10 MG/ML | INTRAVENOUS | Qty: 10

## 2022-10-04 MED FILL — SOLU-CORTEF 100 MG IJ SOLR: 100 MG | INTRAMUSCULAR | Qty: 2

## 2022-10-04 MED FILL — SODIUM BICARBONATE 8.4 % IV SOLN: 8.4 % | INTRAVENOUS | Qty: 150

## 2022-10-04 NOTE — Progress Notes (Signed)
Referral source: Chaplain notified of Misha Antonini death on [facility] CORONARY CARE.     Assessment: No family present. Offered silent acknowledgement for patient's life.    Plan of Care: I do not anticipate the need for ongoing spiritual care. If additional concerns arise, please page the chaplain on-call at [phone])      Fletcher Anon, [place], [place], [facility]  Staff Chaplain

## 2022-10-04 NOTE — Progress Notes (Signed)
Physician Progress Note      PATIENTSUMMER, Jaime [name]  CSN #:                  [id number]  DOB:                       06/30/1946  ADMIT DATE:       09/25/2022 4:10 PM  [place] DATE:        2022/10/08 8:25 AM  RESPONDING  PROVIDER #:        Modena Morrow MD          QUERY TEXT:    Pt admitted with influenza. Pt noted to have Sepsis and acute organ   dysfunction of respiratory failure and hypotension requiring levophed. If   possible, please document in progress notes and discharge summary the   relationship, if any, between Sepsis and respiratory failure/ hypotension    The medical record reflects the following:  Risk Factors: sepsis  Clinical Indicators:  11/30 PN  CV - Hypotension/shock  Pulm - Hypoxic resp failure, BL effusions, BL LL atelectasis, multifocal PNA,   flu +ve    Treatment: critical care monitoring, maxipime IV, levaquin IV, flagyl IV,   tamiflu, levophed, vancomycin IV, 1 lt IVF bolus  Thank you,  Caprice Kluver RN, CCDS, [facility]  Certified Clinical Documentation Integrity Specialist  [phone]  [email or web address]    I can also be reached on perfect serve  Options provided:  -- Severe sepsis with septic shock  -- Other - I will add my own diagnosis  -- Disagree - Not applicable / Not valid  -- Disagree - Clinically unable to determine / Unknown  -- Refer to Clinical Documentation Reviewer    PROVIDER RESPONSE TEXT:    This patient has Severe sepsis with septic shock.    Query created by: Felipa Eth on 10/02/2022 9:46 AM      QUERY TEXT:    Patient admitted with sepsis. Noted to have documentation of weight loss and    dietician assessment with malnutrition diagnosis. If possible, please document   in progress notes and discharge summary if you are evaluating and /or   treating any of the following:      The medical record reflects the following:  Risk Factors: weight loss  Clinical Indicators:  11/30 nutritional note  Malnutrition Assessment:  Malnutrition Status:  Severe  malnutrition (10/01/22 1131)  Context:  Chronic Illness  Findings of the 6 clinical characteristics of malnutrition:  Energy Intake:  Mild decrease in energy intake (Comment)  Weight Loss:  Greater than 10% over 6 months  Body Fat Loss:  Severe body fat loss Orbital  Muscle Mass Loss:  Severe muscle mass loss Temples (temporalis)  Fluid Accumulation:  No significant fluid accumulation (noted edema- not   malnutrition-related)  Grip Strength:  Not Performed  Nutrition Diagnosis:  ? Severe malnutrition related to increase demand for energy/nutrients,   inadequate protein-energy intake (lack of appetite) as evidenced by dialysis,   wounds, intake 0-25%, Criteria as identified in malnutrition assessment  ?    Treatment: Nutrition Interventions:  Food and/or Nutrient Delivery: Continue Current Diet, Start Oral Nutrition   Supplement  Nutrition Education/Counseling: No recommendation at this time  Coordination of Nutrition Care: Continue to monitor while inpatient,   Interdisciplinary Rounds    Thank you,  Caprice Kluver RN, CCDS, [facility]  Certified Clinical Documentation Integrity Specialist  [phone]  [email or web address]    I can also be reached on perfect serve        ASPEN Criteria:    https://aspenjournals.onlinelibrary.wiley.com/doi/full/10.1177/014860711244028  5  Options provided:  -- Protein calorie malnutrition severe  -- Other - I will add my own diagnosis  -- Disagree - Not applicable / Not valid  -- Disagree - Clinically unable to determine / Unknown  -- Refer to Clinical Documentation Reviewer    PROVIDER RESPONSE TEXT:    Provider is clinically unable to determine a response to this query.    Query created by: Maryclare Bean on 10/02/2022 9:48 AM      QUERY TEXT:    Pt admitted with sepsis.  Pt noted to have pneumonia. If possible, please   document in the progress notes and discharge summary if you are evaluating   and/or treating any of the following:      Note: CAP and HCAP indicate where the  pneumonia was acquired, not a specific   type.    The medical record reflects the following:  Risk Factors: ESRD/ influenza/ recent hospitalization  Clinical Indicators:  11/29  Influenza A Ag NEG   Positive      11/20 CT  Bilateral lung infiltrates consistent with multifocal pneumonia. Small  consolidation in the lingula demonstrates small cavitations.    Treatment: maxipime IV, levaquin IV, flagyl IV, tamiflu, levophed, vancomycin   IV, 1 lt IVF bolus    Thank you,  Scarlett Presto RN, CCDS, [facility]  Certified Clinical Documentation Integrity Specialist  [phone]  [email or web address]    I can also be reached on perfect serve  Options provided:  -- Gram negative pneumonia  -- Gram positive pneumonia  -- MRSA pneumonia  -- Viral pneumonia  -- Aspiration pneumonia  -- Other - I will add my own diagnosis  -- Disagree - Not applicable / Not valid  -- Disagree - Clinically unable to determine / Unknown  -- Refer to Clinical Documentation Reviewer    PROVIDER RESPONSE TEXT:    Provider is clinically unable to determine a response to this query.  pneumonia organism unknown    Query created by: Maryclare Bean on 10/02/2022 9:51 AM      Electronically signed by:  Mardella Layman MD 10/07/2022 7:49 AM

## 2022-10-04 NOTE — Progress Notes (Signed)
Patient death recorded on the [facility] internal restraint log on 10/24/2022 at 1205.

## 2022-10-04 NOTE — Plan of Care (Signed)
Patient with ongoing deterioration despite maximal support, spouse made aware previously of dismal prognosis and likely course, and her questions were answered. Unable to tolerate further renal replacement due to hypotension.     Isac Lincks L. Madyn Ivins, MD, [place]  Critical Care Medicine

## 2022-10-04 NOTE — Progress Notes (Signed)
HISTORY OF PRESENT ILLNESS: 76 y.o. male with PMHx of ESRD on HD (MWF at [facility] nursing home), acute proctitis with rectal bleeding requiring admission from [name] 1 week ago and discharged on 09/15/2022, anemia, DM 2, PVD, liver abscess, pancreatitis, and chronic sacral ulcer who presented to the emergency department from his nursing home for evaluation of hypotension. Patient was being discharged to SNF and on way to facility, per EMS, pressures were 80s over 50s on their arrival. They brought him back to [facility] ED for evaluation of hypotension. Patient's blood pressures were 80s over 50s in the ED. A femoral central line was placed and patient was started on levophed. He is also required supplemental oxygen, satting in the mid 80s on room air and around 90 with nasal cannula.  Now on nonrebreather.  Work-up notable for positive influenza swab and hyperbilirubinemia.  Right upper quadrant ultrasound ordered.     Interval history    11/30 - Still requiring pressors/HFNC, CT chest showed large L effusion, also showed known perihepatic fluid collection    12/1 - Still requiring pressors (requirements more today)/HFNC, L thora today c/b pneumothorax - now s/p CT    12/2: pt seen and examined, chest tube placed, on high flow o2, levophed, restless but not speaking    12/3: reviewed events with nursing, pt unresponsive on vent, on epi, levo, neo, vaso, off cvvh - lwo bp  Hr dropping      Current Facility-Administered Medications:     lactated ringers bolus bolus 1,000 mL, 1,000 mL, IntraVENous, Once, Halscott, Annie Main, MD, Last Rate: 983.6 mL/hr at 10/02/2022 0637, 1,000 mL at 10/23/2022 0637    albumin human 5% IV solution 25 g, 25 g, IntraVENous, Once, Halscott, Annie Main, MD, Last Rate: 1,000 mL/hr at 10/29/2022 0636, 25 g at 10/24/2022 0636    fentaNYL (SUBLIMAZE) injection 50 mcg, 50 mcg, IntraVENous, Q2H PRN, Mardella Layman, MD, 50 mcg at 10/03/22 1124    fentaNYL (SUBLIMAZE) 1,000 mcg in sodium chloride 0.9% 100 mL  infusion, 25-200 mcg/hr, IntraVENous, Continuous, Abbie Berling C, MD, Last Rate: 1.3 mL/hr at 10/03/22 2055, 12.5 mcg/hr at 10/03/22 2055    prismaSol BGK 4/2.5 dialysis solution, , Dialysis, Continuous, Harlow Ohms R, MD, Last Rate: 2,000 mL/hr at 10/03/22 1322, New Bag at 10/03/22 1322    hydrocortisone sodium succinate PF (SOLU-CORTEF) 100 mg in sterile water 2 mL injection, 100 mg, IntraVENous, Q8H, Theodis Kinsel C, MD, 100 mg at 10/12/2022 1610    sodium bicarbonate 150 mEq in dextrose 5 % 1,000 mL infusion, , IntraVENous, Continuous, Harlow Ohms R, MD, Last Rate: 100 mL/hr at 10/10/2022 0211, New Bag at 10/08/2022 0211    norepinephrine (LEVOPHED) 32 mg in sodium chloride 0.9 % 250 mL infusion, 1-100 mcg/min, IntraVENous, Continuous, Irish Piech C, MD, Last Rate: 46.9 mL/hr at 10/09/2022 0400, 100 mcg/min at 10/07/2022 0400    vasopressin 40 Units in sodium chloride 0.9 % 100 mL infusion, 0.02-0.04 Units/min, IntraVENous, Continuous, Legrand Lasser C, MD, Last Rate: 6 mL/hr at 10/03/22 2355, 0.04 Units/min at 10/03/22 2355    phenylephrine (NEO-SYNEPHRINE) 100 mg in sodium chloride 0.9 % 250 mL infusion, 10-300 mcg/min, IntraVENous, Continuous, Merrily Tegeler C, MD, Last Rate: 45 mL/hr at 10/24/2022 0406, 300 mcg/min at 10/31/2022 0406    amiodarone (CORDARONE) 450 mg in dextrose 5 % 250 mL infusion (Vial2Bag), 0.5 mg/min, IntraVENous, Continuous, La Dibella, Valetta Close, MD, Stopped at 10/07/2022 [phone]    EPINEPHrine 5 mg in sodium chloride 0.9 %  250 mL infusion, 1-20 mcg/min, IntraVENous, Continuous, Halscott, Annie Main, MD, Last Rate: 60 mL/hr at October 05, 2022 0401, 20 mcg/min at 10-05-2022 0401    heparin (porcine) 1000 UNIT/ML injection 1,800 Units, 1,800 Units, IntraCATHeter, PRN, 1,800 Units at 10/03/22 2231 **AND** heparin (porcine) 1000 UNIT/ML injection 1,800 Units, 1,800 Units, IntraCATHeter, PRN, Ethelene Hal, MD, 1,800 Units at 10/03/22 2231    albumin human 25% IV solution 25 g, 25 g, IntraVENous, PRN, Ethelene Hal, MD,  Stopped at 10/02/22 0900    dextrose 10 % infusion, , IntraVENous, Continuous, Adekunle, Crista Curb B, MD, Last Rate: 25 mL/hr at 10/03/22 1726, New Bag at 10/03/22 1726    sodium chloride flush 0.9 % injection 5-40 mL, 5-40 mL, IntraVENous, 2 times per day, Burnett Corrente B, MD, 10 mL at 10/03/22 2101    HYDROmorphone HCl PF (DILAUDID) injection 1 mg, 1 mg, IntraVENous, Q4H PRN, Burnett Corrente B, MD, 1 mg at 10/02/22 1531    HYDROcodone-acetaminophen (NORCO) 5-325 MG per tablet 1 tablet, 1 tablet, Oral, Q6H PRN, Burnett Corrente B, MD    dexmedetomidine (PRECEDEX) 400 mcg in sodium chloride 0.9 % 100 mL infusion, 0.1-1.5 mcg/kg/hr, IntraVENous, Continuous, Halscott, Annie Main, MD, Last Rate: 12.4 mL/hr at 10-05-2022 0002, 0.6 mcg/kg/hr at 10/05/2022 0002    magnesium sulfate 2000 mg in 50 mL IVPB premix, 2,000 mg, IntraVENous, PRN, Cliffton Asters, MD, Stopped at 10/01/22 1456    sodium phosphate 15 mmol in sodium chloride 0.9 % 250 mL IVPB, 15 mmol, IntraVENous, PRN, Cliffton Asters, MD, Stopped at 10/03/22 1155    dextrose 50 % IV solution, 25 g, IntraVENous, PRN, Cliffton Asters, MD, 25 g at 10/02/22 1846    midodrine (PROAMATINE) tablet 20 mg, 20 mg, Oral, q8h, Adekunle, Oluwaseyi B, MD, 20 mg at 10/02/22 0519    insulin lispro (HUMALOG) injection vial 0-4 Units, 0-4 Units, SubCUTAneous, TID WC, Adekunle, Oluwaseyi B, MD    insulin lispro (HUMALOG) injection vial 0-4 Units, 0-4 Units, SubCUTAneous, Nightly, Adekunle, Oluwaseyi B, MD    glucose chewable tablet 16 g, 4 tablet, Oral, PRN, Adekunle, Oluwaseyi B, MD    dextrose bolus 10% 125 mL, 125 mL, IntraVENous, PRN **OR** dextrose bolus 10% 250 mL, 250 mL, IntraVENous, PRN, Adekunle, Oluwaseyi B, MD    glucagon injection 1 mg, 1 mg, SubCUTAneous, PRN, Adekunle, Oluwaseyi B, MD    dextrose 10 % infusion, , IntraVENous, Continuous PRN, Burnett Corrente B, MD    oseltamivir (TAMIFLU) capsule 30 mg, 30 mg, Oral, Once per day on Mon Wed Fri, Matthew,  Dellyn R, NP-C    heparin (porcine) injection 5,000 Units, 5,000 Units, SubCUTAneous, BID, Burnett Corrente B, MD, 5,000 Units at 10/03/22 2105    [COMPLETED] ceFEPIme (MAXIPIME) 2,000 mg in sterile water 20 mL IV syringe, 2,000 mg, IntraVENous, Once, 2,000 mg at 10/01/22 1258 **FOLLOWED BY** cefepime (MAXIPIME) 1,000 mg in sodium chloride 0.9 % 50 mL IVPB (mini-bag), 1,000 mg, IntraVENous, Q24H, Adekunle, Crista Curb B, MD, Stopped at 10/03/22 1627    Vancomycin - pharmacy to dose, , Other, RX Placeholder, Adekunle, Crista Curb B, MD    magnesium sulfate 1000 mg in dextrose 5% 100 mL IVPB, 1,000 mg, IntraVENous, Once, Zaw, Kyaw T, MD    balsum peru-castor oil (VENELEX) ointment, , Topical, BID, Zaw, Kyaw T, MD, Given at 10/03/22 2057    collagenase ointment, , Topical, Daily, Zaw, Kyaw T, MD, Given at 10/03/22 0927    metronidazole (FLAGYL) 500 mg in 0.9% NaCl 100 mL IVPB premix, 500 mg,  IntraVENous, Q8H, Thalia Party, MD, Stopped at 10/07/2022 0606    ipratropium (ATROVENT) 0.02 % nebulizer solution 0.5 mg, 0.5 mg, Nebulization, TID, Shiela Mayer A, MD, 0.5 mg at 10/03/22 2129    sodium chloride flush 0.9 % injection 5-40 mL, 5-40 mL, IntraVENous, 2 times per day, Theodore Demark R, NP-C, 10 mL at 10/03/22 2101    sodium chloride flush 0.9 % injection 5-40 mL, 5-40 mL, IntraVENous, PRN, Theodore Demark R, NP-C    0.9 % sodium chloride infusion, , IntraVENous, PRN, Theodore Demark R, NP-C    albuterol (PROVENTIL) (2.5 MG/3ML) 0.083% nebulizer solution 2.5 mg, 2.5 mg, Nebulization, Q6H PRN, Theodore Demark R, NP-C    acetaminophen (TYLENOL) tablet 650 mg, 650 mg, Oral, Q6H PRN **OR** acetaminophen (TYLENOL) suppository 650 mg, 650 mg, Rectal, Q6H PRN, Theodore Demark R, NP-C, 650 mg at 10/03/22 1136    polyethylene glycol (GLYCOLAX) packet 17 g, 17 g, Oral, Daily PRN, Theodore Demark R, NP-C    ondansetron (ZOFRAN) injection 4 mg, 4 mg, IntraVENous, Q6H PRN, Theodore Demark R, NP-C    pantoprazole  (PROTONIX) 40 mg in sodium chloride (PF) 0.9 % 10 mL injection, 40 mg, IntraVENous, Daily, Theodore Demark R, NP-C, 40 mg at 10/03/22 1610    zinc sulfate (ZINCATE) 220 mg capsule - elemental zinc 50 mg, 50 mg, Oral, Daily, Theodore Demark R, NP-C, 50 mg at 10/01/22 0836    [Held by provider] mirtazapine (REMERON) tablet 7.5 mg, 7.5 mg, Oral, Nightly, Matthew, Dellyn R, NP-C    PHYSICAL EXAMINATION:  General - chronically ill, unresponsive  Neuro - pupils non reactive, not moving to pain  CV - slow  Lungs - coarse BL  Abd - soft, nd  Skin: mottled  Ext - cool, edema, bruising    VITAL SIGNS:  Vitals:    10/03/2022 0600   BP:    Pulse: (!) 42   Resp: 20   Temp:    SpO2:        LABORATORY ANALYSIS:labs reviewed, bicarb below 5, hgb 3.9  @LABSLAST72HRS @  No results displayed because visit has over 200 results.        Encounter Date: 2022/10/18   EKG 12 Lead   Result Value    Ventricular Rate 128    Atrial Rate 128    P-R Interval 152    QRS Duration 154    Q-T Interval 338    QTc Calculation (Bazett) 493    R Axis -35    T Axis 117    Diagnosis      Sinus tachycardia  Left axis deviation  Left bundle branch block  When compared with ECG of 2022-10-18 16:25,  Left bundle branch block is now present         RADIOGRAPHIC STUDIES:  XR CHEST PORTABLE  Narrative: EXAM:  XR CHEST PORTABLE    INDICATION: Chest tubes    COMPARISON: 10/03/2022    TECHNIQUE: Portable chest AP view    FINDINGS: Right internal jugular tunneled dialysis catheter, endotracheal tube,  nasogastric tube, left internal jugular central venous catheter, and 2  left-sided chest tubes are stable in position. The cardiac silhouette is within  normal limits. Moderately large left basilar pneumothorax is not significantly  changed. There is increased bibasilar atelectasis. The visualized bones and  upper abdomen are unremarkable.  Impression: Unchanged left basilar pneumothorax. Increased bibasilar atelectasis.  XR CHEST PORTABLE  Narrative: EXAM: Portable CXR.  1518 hours.    COMPARISON: 1236 hours.  INDICATION: L CVC placement    FINDINGS:  Left IJ CVC has been placed with tip in the SVC. Right IJ CVL, ET tube and 2  left chest tubes show no significant change. Left pneumothorax persists.  Impression: Satisfactory line placement. Persistent left pneumothorax.  XR ABDOMEN (KUB) (SINGLE AP VIEW)  Narrative: EXAM: KUB    INDICATION: OGT placement  Hypoxemia / Reason for exam:->OGT placement    Portable semierect KUB at 1238 hours shows NG tube placement with tip in the  proximal stomach.  Impression: Satisfactory NG tube placement.  XR CHEST PORTABLE  Narrative: EXAM: Portable CXR. 1236 hours.    COMPARISON: 0727 hours.      INDICATION: intubation    FINDINGS:  There is satisfactory intubation.  There is satisfactory NG tube placement in the stomach.  Right IJ CVL remains satisfactory.  2 left chest tubes are stable with stable small left pneumothorax.  Improved right lung aeration with persistent patchy airspace disease.  Normal size heart.  No pulmonary edema.  Impression: Satisfactory intubation.  XR CHEST PORTABLE  Narrative: EXAM: Portable CXR. 0426 hours.    COMPARISON: 10/02/2022, 1408 hours.      INDICATION: pnuemo, chest tube    FINDINGS:  Portable chest shows moderate left pneumothorax without significant change. Left  chest tube is stable. Stable right airspace disease. Stable right IJ CVL.  Impression: Persistent moderate left pneumothorax. No new finding.  XR CHEST PORTABLE  Narrative: EXAM: Portable CXR. 0727 hours.    COMPARISON: 0426 hours      INDICATION: CT insertion    FINDINGS:  A second chest tube has been placed on the left with decreased, now small, left  pneumothorax, with otherwise stable appearance  Impression: Satisfactory additional left chest tube placement with decreased  left pneumothorax.         Films/reports reviewed, small ptx left, infiltrates right, inc basilar opacities    ASSESSMENT:  76 y.o. male with PMHx of ESRD on HD (MWF at [facility] nursing home), chronic hypotension, acute proctitis with rectal bleeding, anemia, DM 2, PVD, perforated viscus s/p ex lap c/b liver abscess, pancreatitis, and chronic sacral ulcer being managed for septic shock, influenza +Ve, acute hypoxic resp failure      IMPRESSION AND PLAN:    Pt now terminal with profound acidemia, unresponsive shock, likely blood loss anemia    I called and dw his wife - she has decided to change codes status to dnr  Will continue full support until she arrives then discuss possible comfort measures      Neuro - declining function on exam likely due to hypoperfusion, continue pressors    CV - Hypotension/shock, hx of chronic hypotension  Cont HD monitoring, MAP goal > 60 for now, on maximal pressors, hydrocortisone, holding cvvh    Pulm - Hypoxic resp failure, BL effusions, BL LL atelectasis, multifocal PNA, flu +ve, L pneumothorax, , small ptx - no need for another tube at this point, no tension    GI - Cirrhosis, Chronic Perihepatic fluid collection      Renal - ESRD on HD  Holding crrt due to hypotension    Heme - profound anemia, transfusions would be futile at this point so will hold    ID - Presumed sepsis unclear source, slight leukocytosis, flu +ve, chronic perihepatic collection  Cont BSabx - vanc, cefepime for now, f/u micro studies, cont Tamiflu, abx    Endo - Keep glu 100 - 180; currently requiring dextrose containing infusion  to maintain glu level     Pt with vital organ dysfunction  High risk of complications, morbidity, mortality  Prognosis very guarded, palliative care team consulted  CCT 45 min eop

## 2022-10-04 NOTE — Other (Signed)
Death Pronouncement Note  Patient's Name: Jaime [name]   Patient's Date of Birth: 08-26-1946  MRN Number: [id number]    Admitting Provider: Robet Leu, MD  Attending Provider: Janey Genta, MD    Patient was examined and the following were absent: Pulses, Blood Pressure, and Respiratory effort    I declared the patient dead on 2022/10/10 at 8:25 AM    Preliminary Cause of Death: Septic shock [facility])     Electronically signed by Lawana Chambers, MD on 10/10/22 at 8:31 AM EST

## 2022-10-04 NOTE — Discharge Summary (Signed)
Death Discharge Summary      Patient ID:  Jaime [name]  76 y.o.  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 ([place]) 07/25/2022    S/P transmetatarsal amputation of foot, right ([place]) 07/25/2022    Wound dehiscence 06/22/2022    Acute pancreatitis 06/18/2022    Aspiration pneumonia of both lower lobes ([place]) 06/12/2022    Gangrene of toe of both feet ([place]) 06/12/2022    Bandemia 06/12/2022    Septicemia ([place]) 06/09/2022    Bacteremia due to Escherichia coli 06/08/2022    Hepatic abscess 06/08/2022    Peripheral arterial disease ([place]) 06/08/2022    Hyperbilirubinemia 06/08/2022    Gram negative sepsis ([place]) 06/08/2022    Intestinal obstruction ([facility]) 06/05/2022    Severe sepsis ([place]) 06/05/2022    Septic shock ([place]) 06/05/2022    E coli infection 06/05/2022    Liver abscess 06/05/2022    Pneumoperitoneum 06/05/2022    Acute respiratory failure with hypoxia ([place]) 06/05/2022    AKI (acute kidney injury) ([place]) 06/05/2022    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  He  Developed pneumothorax after thoracentesis.  Had progressive fever, hypotension/shock and resp distress requiring pressor, intubation, CRRT.  He did not respond to treatment and expired.        Condition at discharge: Deceased      Lawana Chambers, MD

## 2022-10-04 NOTE — Progress Notes (Signed)
0730 Bedside and Verbal shift change report given to Michala Deblanc, RN (Soil scientist) by Eustaquio Maize, RN (offgoing nurse). Report included the following information Nurse Handoff Report, Intake/Output, MAR, and Recent Results.   0825 pt asystole on the monitor - Dr. Marykay Lex at bedside - see his note

## 2022-10-05 LAB — CULTURE, BLOOD 1: Culture: NO GROWTH

## 2022-10-05 LAB — CULTURE, BLOOD 2: Culture: NO GROWTH

## 2022-10-05 LAB — CULTURE, BODY FLUID: Gram Stain: NONE SEEN

## 2022-10-06 LAB — 1,3 BETA-D-GLUCAN
Fungitell: 65.624 pg/mL
Result: POSITIVE — AB

## 2022-10-08 LAB — SMEAR FUNGUS

## 2022-10-15 ENCOUNTER — Inpatient Hospital Stay: Payer: MEDICARE | Attending: Surgery

## 2022-11-02 LAB — CULTURE, FUNGUS

## 2022-11-02 DEATH — deceased

## 2022-11-16 LAB — FUNGAL STAIN

## 2022-11-19 LAB — CULTURE WITH SMEAR, ACID FAST BACILLIUS
AFB Smear: NEGATIVE
Acid Fast Culture: NEGATIVE
